# Patient Record
Sex: Female | Born: 1960 | Race: White | Smoking: Current some day smoker
Health system: Northeastern US, Academic
[De-identification: ages and names within clinical notes are randomized; demographics above are authoritative.]

## PROBLEM LIST (undated history)

## (undated) DIAGNOSIS — R011 Cardiac murmur, unspecified: Secondary | ICD-10-CM

## (undated) DIAGNOSIS — J811 Chronic pulmonary edema: Secondary | ICD-10-CM

## (undated) DIAGNOSIS — K219 Gastro-esophageal reflux disease without esophagitis: Secondary | ICD-10-CM

## (undated) DIAGNOSIS — R0602 Shortness of breath: Secondary | ICD-10-CM

## (undated) DIAGNOSIS — N979 Female infertility, unspecified: Secondary | ICD-10-CM

## (undated) DIAGNOSIS — C50919 Malignant neoplasm of unspecified site of unspecified female breast: Secondary | ICD-10-CM

## (undated) DIAGNOSIS — I509 Heart failure, unspecified: Secondary | ICD-10-CM

## (undated) DIAGNOSIS — I4891 Unspecified atrial fibrillation: Secondary | ICD-10-CM

## (undated) DIAGNOSIS — Z720 Tobacco use: Secondary | ICD-10-CM

## (undated) DIAGNOSIS — N930 Postcoital and contact bleeding: Secondary | ICD-10-CM

## (undated) HISTORY — PX: OTHER SURGICAL HISTORY: SHX169

## (undated) HISTORY — DX: Heart failure, unspecified: I50.9

## (undated) HISTORY — DX: Malignant neoplasm of unspecified site of unspecified female breast: C50.919

## (undated) HISTORY — DX: Unspecified atrial fibrillation: I48.91

## (undated) HISTORY — PX: ROTATOR CUFF REPAIR: SHX139

## (undated) HISTORY — DX: Cardiac murmur, unspecified: R01.1

## (undated) HISTORY — DX: Tobacco use: Z72.0

## (undated) HISTORY — DX: Chronic pulmonary edema: J81.1

## (undated) HISTORY — DX: Postcoital and contact bleeding: N93.0

## (undated) HISTORY — DX: Female infertility, unspecified: N97.9

## (undated) MED FILL — Paclitaxel IV Conc 300 MG/50ML (6 MG/ML): INTRAVENOUS | Qty: 31.17 | Status: AC

## (undated) MED FILL — ERX 40807065 - SEQUENCING OF DRUG OF ADMINISTRATION: Qty: 1 | Status: AC

## (undated) MED FILL — Cyclophosphamide IV Soln 1 GM/5ML (200 MG/ML): INTRAVENOUS | Qty: 7.02 | Status: AC

## (undated) MED FILL — Carboplatin IV Soln 450 MG/45ML: INTRAVENOUS | Qty: 20.67 | Status: AC

## (undated) MED FILL — Doxorubicin HCl Inj 2 MG/ML: INTRAVENOUS | Qty: 70 | Status: AC

## (undated) NOTE — Result Encounter Note (Signed)
Formatting of this note might be different from the original.  Please notify the patient the culture was positive for MRSA. Please continue the antibiotic that was provided.    Thank you  Electronically signed by Yancey Flemings, DO at 05/09/2021  3:27 PM EDT

## (undated) NOTE — Progress Notes (Signed)
Formatting of this note is different from the original.  Images from the original note were not included.    Carly Higgins                                                            05/04/2021  06-16-1960  [id number]    REASON FOR VISIT:   Patient presents with:  LESION, SKIN: Sore on stomach for a couple weeks.    HISTORY OF PRESENT ILLNESS:   Carly Higgins is a 22 year old female who presents today with right lower abdomen with open infected wound started as a small pimple she states over 2 weeks ago, has grown and is exposing fat layer now , no fever took an antibiotic by her oncologist which helped and her oncologist told her come here, here for a few more weeks before going home , doing radiation therapy for breast cancer     PAST MEDICAL HISTORY:  PAST MEDICAL HISTORY   Diagnosis Date    Cancer (HCC)     Hypertension     Thyroid disease      PAST SURGICAL HISTORY:  PAST SURGICAL HISTORY   Procedure Laterality Date    BONE MARROW HARV TRANSPLANT       SOCIAL HISTORY:  Social History    Social History Narrative      Not on file    FAMILY HISTORY:  History reviewed. No pertinent family history.    ALLERGIES:  Patient has no known allergies.    CURRENT MEDICATIONS:  No current outpatient medications on file prior to visit.     No current facility-administered medications on file prior to visit.     Some of the medications discontinued during this visit may have been discontinued upon the verbal directive of the patient that they are no longer taking.     05/04/21  1054   BP: 110/70   Pulse: 72   Resp: 18   Temp: 36.2 C (97.2 F)   SpO2: 98%   Weight: 109.8 kg (242 lb)   Height: 167.6 cm (5\' 6" )     Body mass index is 39.06 kg/m.    REVIEW OF SYSTEMS:    Review of Systems   Constitutional:  Negative for chills and fever.     PHYSICAL EXAM:    Physical Exam  Constitutional:       General: She is not in acute distress.     Appearance: She is not ill-appearing.   Skin:         Comments: 3.5 cm x 2.2. cm open wound with fat  layer exposure right abdominal area with clear discharge, no red streaks   Neurological:      Mental Status: She is alert.     ASSESSMENT/PLAN:  Diagnoses and all orders for this visit:  Nonhealing nonsurgical wound with fat layer exposed  -     CULTURE, AEROBIC BACTERIA; Future  -     CONSULT TO WOUND CARE_FL; Future  -     doxycycline hyclate (VIBRAMYCIN) 100 mg capsule; Take 1 capsule by mouth twice daily for 10 days.    Wound care referral to [facility] wound care, info sheet provided, she states will stop by their location to make an appointment, she has no pcp here, once culture returns  will call her and make any adjustment to her antibiotic we prescribed today     Patient Instructions     CELLULITIS:  Your exam shows you have an infection of the skin called cellulitis. This infection usually develops after an injury, cut, bite, or sting, but may occur without any known cause. Usually there is a localized area of redness, swelling, and pain which gets constantly bigger unless treatment is started. If cellulitis is severe or does not respond to initial treatment, hospital care with antibiotic injections may be needed.    Treatment of cellulitis includes antibiotics along with resting and elevating the affected area until the infection improves.  Please see your doctor if the pain and swelling from your infection are not better after two days of treatment.     Go to the emergency department right away if you develop fever, chills, or other serious problems.     Delsa Sale, DO  05/04/2021  10:51 AM  Electronically signed by Barnabas Lister Il, DO at 05/04/2021 11:30 AM EDT

---

## 2009-10-17 ENCOUNTER — Ambulatory Visit: Payer: Self-pay | Admitting: Registered"

## 2010-06-16 ENCOUNTER — Ambulatory Visit
Admit: 2010-06-16 | Discharge: 2010-06-16 | Disposition: A | Discharge status: Home / Self Care | Payer: Self-pay | Source: Ambulatory Visit | Attending: Internal Medicine | Admitting: Internal Medicine

## 2010-06-16 LAB — CBC
Hematocrit: 41 % (ref 34–45)
Hemoglobin: 13.5 g/dL (ref 11.2–15.7)
MCV: 83 fL (ref 79–95)
Platelets: 254 10*3/uL (ref 160–370)
RBC: 4.9 MIL/uL (ref 3.9–5.2)
RDW: 12.9 % (ref 11.7–14.4)
WBC: 7.3 10*3/uL (ref 4.0–10.0)

## 2010-06-16 LAB — URINALYSIS WITH MICROSCOPIC
Blood,UA: NEGATIVE
Ketones, UA: NEGATIVE
Leuk Esterase,UA: NEGATIVE
Nitrite,UA: NEGATIVE
Protein,UA: NEGATIVE mg/dL
RBC,UA: <1 /hpf (ref 0–2)
Specific Gravity,UA: 1.009 (ref 1.002–1.030)
WBC,UA: <1 /hpf (ref 0–5)
pH,UA: 6 (ref 5.0–8.0)

## 2010-06-16 LAB — COMPREHENSIVE METABOLIC PANEL
ALT: 32 U/L (ref 0–35)
AST: 28 U/L (ref 0–35)
Albumin: 4.8 g/dL (ref 3.5–5.2)
Alk Phos: 98 U/L (ref 35–105)
Anion Gap: 9 (ref 7–16)
Bilirubin,Total: 0.3 mg/dL (ref 0.0–1.2)
CO2: 28 mmol/L (ref 20–28)
Calcium: 9.4 mg/dL (ref 8.6–10.2)
Chloride: 101 mmol/L (ref 96–108)
Creatinine: 0.74 mg/dL (ref 0.51–0.95)
GFR,Black: >59 *
GFR,Caucasian: >59 *
Glucose: 87 mg/dL (ref 60–99)
Lab: 18 mg/dL (ref 6–20)
Potassium: 4.5 mmol/L (ref 3.3–5.1)
Sodium: 138 mmol/L (ref 133–145)
Total Protein: 7.4 g/dL (ref 6.3–7.7)

## 2010-06-16 LAB — LIPID PANEL
Chol/HDL Ratio: 2.6
Cholesterol: 176 mg/dL
HDL: 68 mg/dL
LDL Calculated: 101 mg/dL
Non HDL Cholesterol: 108 mg/dL
Triglycerides: 36 mg/dL

## 2010-06-18 LAB — VITAMIN D
25-OH VIT D2: <4 ng/mL
25-OH VIT D3: 20 ng/mL
25-OH Vit Total: 20 ng/mL — ABNORMAL LOW (ref 30–80)

## 2013-08-27 ENCOUNTER — Ambulatory Visit
Admit: 2013-08-27 | Discharge: 2013-08-27 | Disposition: A | Discharge status: Home / Self Care | Payer: Self-pay | Source: Ambulatory Visit | Attending: Internal Medicine | Admitting: Internal Medicine

## 2013-08-27 LAB — TSH: TSH: 4.72 u[IU]/mL — ABNORMAL HIGH (ref 0.27–4.20)

## 2013-08-27 LAB — CBC
Hematocrit: 39 % (ref 34–45)
Hemoglobin: 12.2 g/dL (ref 11.2–15.7)
MCH: 28 pg/cell (ref 26–32)
MCHC: 31 g/dL — ABNORMAL LOW (ref 32–36)
MCV: 90 fL (ref 79–95)
Platelets: 180 10*3/uL (ref 160–370)
RBC: 4.3 MIL/uL (ref 3.9–5.2)
RDW: 16.3 % — ABNORMAL HIGH (ref 11.7–14.4)
WBC: 43.5 10*3/uL — ABNORMAL HIGH (ref 4.0–10.0)

## 2013-08-27 LAB — LIPID PANEL
Chol/HDL Ratio: 3.8
Cholesterol: 184 mg/dL
HDL: 48 mg/dL
LDL Calculated: 113 mg/dL
Non HDL Cholesterol: 136 mg/dL
Triglycerides: 113 mg/dL

## 2013-08-27 LAB — COMPREHENSIVE METABOLIC PANEL
ALT: 23 U/L (ref 0–35)
AST: 27 U/L (ref 0–35)
Albumin: 4.6 g/dL (ref 3.5–5.2)
Alk Phos: 76 U/L (ref 35–105)
Anion Gap: 15 (ref 7–16)
Bilirubin,Total: 0.3 mg/dL (ref 0.0–1.2)
CO2: 23 mmol/L (ref 20–28)
Calcium: 9 mg/dL (ref 8.6–10.2)
Chloride: 104 mmol/L (ref 96–108)
Creatinine: 1.04 mg/dL — ABNORMAL HIGH (ref 0.51–0.95)
GFR,Black: 71 *
GFR,Caucasian: 61 *
Glucose: 99 mg/dL (ref 60–99)
Lab: 24 mg/dL — ABNORMAL HIGH (ref 6–20)
Potassium: 5.2 mmol/L — ABNORMAL HIGH (ref 3.3–5.1)
Sodium: 142 mmol/L (ref 133–145)
Total Protein: 7.1 g/dL (ref 6.3–7.7)

## 2013-08-27 LAB — T4, FREE: Free T4: 0.9 ng/dL (ref 0.9–1.7)

## 2013-08-28 LAB — HEPATITIS C ANTIBODY: Hep C Ab: NEGATIVE

## 2013-08-31 LAB — VITAMIN D
25-OH VIT D2: <4 ng/mL
25-OH VIT D3: 20 ng/mL
25-OH Vit Total: 20 ng/mL — ABNORMAL LOW (ref 30–60)

## 2013-09-01 ENCOUNTER — Ambulatory Visit
Admit: 2013-09-01 | Discharge: 2013-09-01 | Disposition: A | Discharge status: Home / Self Care | Payer: Self-pay | Source: Ambulatory Visit | Attending: Internal Medicine | Admitting: Internal Medicine

## 2013-09-01 LAB — CBC
Hematocrit: 41 % (ref 34–45)
Hemoglobin: 12.8 g/dL (ref 11.2–15.7)
MCH: 28 pg/cell (ref 26–32)
MCHC: 32 g/dL (ref 32–36)
MCV: 89 fL (ref 79–95)
Platelets: 200 10*3/uL (ref 160–370)
RBC: 4.5 MIL/uL (ref 3.9–5.2)
RDW: 16.1 % — ABNORMAL HIGH (ref 11.7–14.4)
WBC: 50.3 10*3/uL — ABNORMAL HIGH (ref 4.0–10.0)

## 2013-09-03 LAB — JAK2 GENE: JAK2 V617F: NEGATIVE

## 2013-09-03 LAB — JAK2 REVIEW

## 2013-09-07 LAB — FISH, CYTOGENETICS: FISH Cells: 200

## 2013-09-07 LAB — FISH REVIEW

## 2013-09-08 LAB — SURGICAL PATHOLOGY

## 2013-09-10 ENCOUNTER — Ambulatory Visit: Payer: Self-pay | Admitting: Hematology

## 2013-09-10 ENCOUNTER — Encounter: Payer: Self-pay | Admitting: Hematology

## 2013-09-10 VITALS — BP 140/75 | HR 67 | Temp 96.8°F | Resp 16 | Ht 65.35 in | Wt 261.0 lb

## 2013-09-10 DIAGNOSIS — D72829 Elevated white blood cell count, unspecified: Secondary | ICD-10-CM

## 2013-09-10 LAB — BASIC METABOLIC PANEL
Anion Gap: 13 (ref 7–16)
CO2: 26 mmol/L (ref 20–28)
Calcium: 9.2 mg/dL (ref 8.6–10.2)
Chloride: 101 mmol/L (ref 96–108)
Creatinine: 0.88 mg/dL (ref 0.51–0.95)
GFR,Black: 87 *
GFR,Caucasian: 75 *
Glucose: 95 mg/dL (ref 60–99)
Lab: 17 mg/dL (ref 6–20)
Potassium: 4.3 mmol/L (ref 3.3–5.1)
Sodium: 140 mmol/L (ref 133–145)

## 2013-09-10 LAB — CBC AND DIFFERENTIAL
Baso # K/uL: 0 10*3/uL (ref 0.0–0.1)
Basophil %: 0 %
Eos # K/uL: 0 10*3/uL (ref 0.0–0.4)
Eosinophil %: 0 %
Hematocrit: 39 % (ref 34–45)
Hemoglobin: 12.3 g/dL (ref 11.2–15.7)
Lymph # K/uL: 2 10*3/uL (ref 1.2–3.7)
Lymphocyte %: 3.9 %
MCH: 29 pg/cell (ref 26–32)
MCHC: 32 g/dL (ref 32–36)
MCV: 90 fL (ref 79–95)
Mono # K/uL: 0.8 10*3/uL (ref 0.2–0.9)
Monocyte %: 1.6 %
Neut # K/uL: 34.3 10*3/uL — ABNORMAL HIGH (ref 1.6–6.1)
Nucl RBC # K/uL: 0.3 10*3/uL — ABNORMAL HIGH (ref 0.0–0.0)
Nucl RBC %: 0.6 /100 WBC — ABNORMAL HIGH (ref 0.0–0.2)
Platelets: 176 10*3/uL (ref 160–370)
RBC: 4.3 MIL/uL (ref 3.9–5.2)
RDW: 16.1 % — ABNORMAL HIGH (ref 11.7–14.4)
Seg Neut %: 59.8 %
WBC: 49.7 10*3/uL — ABNORMAL HIGH (ref 4.0–10.0)

## 2013-09-10 LAB — DIFF MANUAL
Bands %: 9 % (ref 0–10)
Blasts %: 2 % — ABNORMAL HIGH (ref 0–0)
Diff Based On: 127 CELLS
Metamyelocyte %: 12 % — ABNORMAL HIGH (ref 0–1)
Misc. Cell %: 0 % (ref 0–0)
Myelocyte %: 12 % — ABNORMAL HIGH (ref 0–0)

## 2013-09-10 LAB — APTT: aPTT: 28.3 s (ref 25.8–37.9)

## 2013-09-10 LAB — PROTIME-INR
INR: 0.9 — ABNORMAL LOW (ref 1.0–1.2)
Protime: 10.5 s (ref 9.2–12.3)

## 2013-09-10 LAB — DUPLICATE SLIDE: Slide Sent To: 114

## 2013-09-10 LAB — URIC ACID: Urate: 4.9 mg/dL (ref 2.7–6.8)

## 2013-09-10 NOTE — Patient Instructions (Signed)
Ultrasound (NO FOOD OR DRINK FOR 8 HOURS PRIOR) Take the green elevators in the main lobby to the ground floor, radiology will be on your left) 09/24/13 at 8:30am    Dr. Aris Lot 09/29/13 at 2:15pm

## 2013-09-11 LAB — HEMATOPATHOLOGY REVIEW

## 2013-09-14 NOTE — Progress Notes (Addendum)
Name: Carly Higgins  MRN: [id number]  PCP: Benedict Needy, MD (General)  Referring physician: Benedict Needy, MD  Date: 09/10/2013    Reason for referral: Leukocytosis.     History of Present Illness:      Carly Higgins is a 53 y.o. female who is referred to [place] for leukocytosis. She was seen by Dr. Loann Quill and had routine blood work after 3 years which showed wbc count of 43.5 on 08/27/13. Repeat CBC on 09/01/13 showed wbc count of 50.3. There was no differential performed on CBC. Her last CBC 3 years ago showed wbc count 7.3. Peripheral blood flow cytometry was ordered and peripheral smear showed normal lymphocytes (7%) Neutrophils were Increased (37%), with significant left-shifted granulocytosis including bands (19%), metamyelocytes (16%), myelocytes (13%), rare promyelocytes (2%), and blasts (1%). Peripheral flow cytometry was not performed due to normal lymphocytes count and appearance. JAK-2 gene mutation was not detected and FISH for ABL1/BCR was negative.     She reports having a small round bump on left side of forehead for 3 months. It is not increasing in size. She reports having some left ear pain when she had lab work. She reports stress at her work place and stress from a death in her family. She was started on Lexapro and reports improvement in her anxiety and depression symptoms. She denies fevers, chills, recurrent infections, weight loss, cough, chest pain, shortness of breath, nausea, vomiting, diarrhea, constipation, headaches, vision changes, urinary problems, numbness, weakness, bleeding, or bruising. She reports some hot and sweaty feeling from menopause.      Past Medical History:   Anxiety/ Depression.  Obesity    Past Surgical History:  No major surgeries in past.     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       Family History:   History reviewed. No pertinent family history.    Review of Systems: Refer to HPI for pertinent positives and negatives. Remainder of detailed 12 point ROS (constitutional, eyes, ears/nose/mouth/throat, cardiovascular, respiratory, gastrointestinal, genitourinary, musculoskeletal, integumentary, neurologic, psychiatric, endocrine, hematologic, allergic/immunologic) is negative.     Physical Exam  Vitals:   Filed Vitals:    09/10/13 1013   BP: 140/75   Pulse: 67   Temp: 36 C (96.8 F)   Resp: 16   Height: 166 cm (5' 5.35")   Weight: 118.4 kg (261 lb 0.4 oz)     Pain score: 0  ECOG Performance Status: 1  General appearance: well nourished, well developed, appears stated age. Obese.   Head, ears, nose, mouth throat: no head deformities, normal external appearance of ears. normal gross  hearing. No mucositis/thrush, petechiae, gingival bleeding.   Eyes: no scleral icterus. EOMI, PERL.   Lymph nodes: no enlarged cervical, axillary, inguinal or femoral lymph nodes.  Chest: clear to ascultation; no wheezes, crackles, rhonchi.  Cardiac: regular rate and rhythm, no murmurs/rubs/gallops.  Abdominal: active bowel sounds, soft, non-tender to palpation in all four quadrants, no hepatomegaly or splenomegaly.  Musculoskeletal: no CVA tenderness, no tenderness over spine or other bones, no joint swelling, muscle atrophy.  Skin: no bruises or rashes.  Extremities: warm to touch, palpable pulses, no lower extremity  edema.  Neurological: alert and oriented to person, place and time; normal comprehension and speech. grossly normal cranial nerves, strength and sensation; moves all four extremities spontaneously.    Laboratory Results:    Peripheral blood smear reviewed under microscope: shows neutrophilic leukocytosis with increased myelocytes, metamyelocytes, bands and rare blast cells.    09/10/2013 11:39   Sodium 140   Potassium 4.3   Chloride 101   CO2 26   Anion Gap 13   UN 17   Creatinine 0.88   GFR,Black 87   GFR,Caucasian 75   Glucose 95   Calcium 9.2   Urate 4.9   Protime 10.5   INR 0.9 (L)   aPTT 28.3   WBC 49.7 (H)   RBC 4.3   Hemoglobin 12.3   Hematocrit 39   MCV 90   MCH 29   MCHC 32   RDW 16.1 (H)   Platelets 176   Giant PLTs Present   Manual DIFF RESULTS   Neut # K/uL 34.3 (H)   Lymph # K/uL 2.0   Mono # K/uL 0.8   Eos # K/uL 0.0   Baso # K/uL 0.0   Nucl RBC # K/uL 0.3 (H)   Seg Neut % 59.8   Lymphocyte % 3.9   Monocyte % 1.6   Eosinophil % 0.0   Basophil % 0.0   Nucl RBC % 0.6 (H)   Bands % 9   Metamyelocyte % 12 (H)   Myelocyte % 12 (H)   Blasts % 2 (H)   Misc. Cell % 0   Smudge Cells Present   Diff Based On 127   Slide Sent 12:23   Slide Sent To 114   Pathologist Review See Text     Blood culture: NGTD.    FINAL DIAGNOSIS:  Peripheral blood:  - Leukocytosis with left shifted neutrophilia (see comment).  - No malignancy identified    COMMENT: Clinical correlation is required to determine the etiology of  the patient's left-shifted leukocytosis. Molecular/cytogenetics studies  for BCR-ABL and JAK2 are negative, excluding the most common neoplastic  causes of these findings. Lymphocytes are not increased and abnormal  forms are not identified.    MICROSCOPIC DESCRIPTION:  Complete Blood Count, dated 09/01/2013  Test Name Result  WBC 50.3  RBC 4.5  HEMOGLOBIN 12.8  HEMATOCRIT 41  MCV 89  MCH 28  MCHC 32  RDW 16.1  PLATELETS 200    Peripheral Blood Smear:  Quality: Sub-optimal (apoptotic cells present)  Red blood cells:  normocytic, normochromic, nucleated red blood cells  present (3%)  Lymphocytes: unremarkable (not increased, 7%)  Neutrophils: Increased (37%), with significant left-shifted  granulocytosis including bands (19%), metamyelocytes (16%), myelocytes  (13%), rare promyelocytes (2%), and blasts (1%).  Platelets: unremarkable.    JAK-2 Gene  The results were normal. We found no evidence of the JAK2 V617F  mutation in this specimen within the sensitivity limits of the analysis  (  approximately 0.1%). This result does not rule out a  myeloproliferative neoplasm.      FISH analysis with the probe specific for the ABL1/BCR fusion was performed on 200 interphase nuclei. There was no Ph' rearrangement detected.      Assessment:   Carly Higgins is a 53 y.o. female with Neutrophilic Leukocytosis of unknown duration. JAK gene mutation and FISH for ABL1/BCR is negative which makes CML highly unlikely. We obtained the labs on her way out which show persistent neutrophilic leukocytosis with increased myeloid precursors (myelocytes and metamyelocytes) on peripheral smear and rare blasts. She does not have any fever or symptoms of infection to suggest leukemoid reaction. Blood culture show no growth. Smoking and obesity can cause leukocytosis but leukocyte count is too high to be explained by these conditions. Chronic neutrophilic leukemia usually has less than 5% of leukocytes on peripheral blood smear. Other possible differentials include AML or undifferentiated myeloproliferative disorder.      We have scheduled a US abdomen to assess for any splenomegaly.     We will schedule a bone marrow biopsy next week to find out the etiology of this.     Pt was seen and discussed in detail with attending oncologist, Dr. Aris Lot.     Claudette Stapler, MBBS  Med-Oncology/ Hematology Fellow      Oncology Attending:  I have seen and examined the patient.  The case was discussed with the team and I agree with the above management and plan.  Given her  persistent leukocytosis, we will plan a BM biopsy.    Earleen Newport, MD

## 2013-09-16 LAB — BLOOD CULTURE: Bacterial Blood Culture: 0

## 2013-09-21 ENCOUNTER — Telehealth: Payer: Self-pay | Admitting: Hematology

## 2013-09-21 NOTE — Telephone Encounter (Signed)
LM for patient that she should be okay to go back to work tomorrow after her BMBx.  Explained that she may have some discomfort at this site, but this is typically relieved by tylenol.  Asked that she call back with any further questions or concerns.

## 2013-09-22 ENCOUNTER — Ambulatory Visit: Payer: Self-pay

## 2013-09-22 ENCOUNTER — Ambulatory Visit: Payer: Self-pay | Admitting: Hematology

## 2013-09-22 ENCOUNTER — Encounter: Payer: Self-pay | Admitting: Hematology

## 2013-09-22 VITALS — BP 159/86 | HR 75 | Temp 97.5°F | Resp 17 | Ht 65.35 in | Wt 262.5 lb

## 2013-09-22 DIAGNOSIS — D72829 Elevated white blood cell count, unspecified: Secondary | ICD-10-CM | POA: Insufficient documentation

## 2013-09-22 HISTORY — DX: Elevated white blood cell count, unspecified: D72.829

## 2013-09-22 NOTE — Procedures (Signed)
BM Biopsy and Aspiration Procedure  PREOPERATIVE DIAGNOSIS:   Leukocytosis  POSTOPERATIVE DIAGNOSIS:  Leukocytosis      OPERATIVE PROCEDURE:      Bone marrow aspiration and bone marrow biopsy.     ANESTHESIA: 1% lidocaine approximately 10 mL.       DATE AND  TIME OF PROCEDURE: 09/22/2013 at 9:57 AM     INDICATIONS: An 53 year-old female with leukocytosis  Written consent was obtained after explaining in detail about adverse effects of the procedure, which included sciatic fractures, nerve injury, hematoma, infection, and pain.  Patient comprehended the information very well and  has consented to the procedure.      PREPROCEDURE CONDITION:   Patient vital signs stable.     PROCEDURE PARTICIPANTS:   Bone marrow technician Janus Molder and myself    DESCRIPTION OF PROCEDURE:  After the patient was identified with two different identifiers, informed consent was obtained from the patient. The procedure, risks and benefits and the complication such as bleeding, infection, fractures and pain were explained to the patient who voiced understanding of the information. Their questions were sought and answered in appropriate manner. Then patient was placed in prone position and left posterior iliac crest was identified and marked with a skin marker.  The site was sterilized with the sterile chlorhexidine/betadine solution and sterile drape was placed over the site. Subsequently, 1% lidocaine was inserted, approximately 10 mL, to anesthetize the local area and the periostium.  Small incision was made with the sterile scalpel and Jamshidi needle was advanced with steady pressure and a slight twisting motion to the center of the posterior iliac prominence. Approximately 10 mL of bone marrow aspiration was obtained for the diagnostic purpose after confirming the presence of spicules in the specimen.  Subsequently, bone marrow biopsy was performed using the same incision site.  About 1 cm of the bone marrow biopsy specimen was  obtained and sent for evaluation.  Patient has tolerated the procedure without any problem.  He denied any significant pain during or post procedure, and his post-procedure condition remained stable.       POSTPROCEDURE CONDITION:  Patient in stable condition.       FOLLOWUP:  The bone marrow aspiration and biopsy were sent for routine Histopathology, routine cytogenetic, flow cytometry, and bcr-abl panel. Patient has a   follow-up appointment with Dr. Aris Lot next week.     I reviewed the post bone marrow biopsy instruction with her.    Claudette Stapler, MBBS  Hematology/Oncology Fellow

## 2013-09-24 ENCOUNTER — Ambulatory Visit
Admit: 2013-09-24 | Discharge: 2013-09-24 | Disposition: A | Discharge status: Home / Self Care | Payer: Self-pay | Source: Ambulatory Visit | Attending: Hematology | Admitting: Hematology

## 2013-09-25 LAB — CHROMOSOME ANALYSIS
Cells Analyzed: 20
Cells Counted: 20
Karyo Made: 2

## 2013-09-25 LAB — CHROMOSOMES REVIEW

## 2013-09-28 ENCOUNTER — Telehealth: Payer: Self-pay | Admitting: Hematology

## 2013-09-28 NOTE — Telephone Encounter (Signed)
I informed her about bone marrow biopsy final result and explained to her that we will schedule her to see on of our attending who sees myeloid disorder. I have scheduled appointment for her to see [name] and message was left on her extension for appointment (per her request).    She verbalized understanding.    Claudette Stapler, MBBS  Med-Oncology/ Hematology Fellow

## 2013-09-29 ENCOUNTER — Ambulatory Visit: Payer: Self-pay | Admitting: Hematology

## 2013-10-02 ENCOUNTER — Ambulatory Visit: Payer: Self-pay | Admitting: Hematology

## 2013-10-02 ENCOUNTER — Encounter: Payer: Self-pay | Admitting: Hematology

## 2013-10-02 VITALS — BP 147/67 | HR 74 | Temp 97.2°F | Resp 18 | Ht 65.35 in | Wt 263.2 lb

## 2013-10-02 DIAGNOSIS — D471 Chronic myeloproliferative disease: Secondary | ICD-10-CM

## 2013-10-02 MED ORDER — HYDROXYUREA 500 MG PO CAPS *I*
500.0000 mg | ORAL_CAPSULE | Freq: Every day | ORAL | Status: DC
Start: 2013-10-02 — End: 2014-02-02

## 2013-10-02 MED ORDER — PROCHLORPERAZINE MALEATE 10 MG PO TABS *I*
10.0000 mg | ORAL_TABLET | Freq: Four times a day (QID) | ORAL | Status: DC | PRN
Start: 2013-10-02 — End: 2014-11-08

## 2013-10-02 NOTE — H&P (Addendum)
Hematology/Oncology Initial Patient Visit    Subjective:      Patient ID: Carly Higgins is a 53 y.o. 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     HPI   Carly Higgins went to see her PCP in August and routine blood work was checked showing a WBC of 43.5, it was 50 several days later. A smear showed increased neutrophils at 35%, 13% myelocytes, 19% bands, 16% metamyelocytes, 13% myelocytes, 1% blasts and rare promyelocytes. BCR-Abl was negative as was Jak2.  A bone marrow was done on 9/15 which showed leukocytosis most consistent with a myeloproliferative neoplasm, unclassifiable. No significant dysplasia. Myeloid:erythroid ratio of 9. Flow showed a normal myeloid maturation pattern. Normal karyotype.  She presents today for treatment recommendations for her myeloproliferative disorder.     She has mostly been feeling well just with some fatigue. She has gained weight but thinks this is due to stress. We discussed her cigarette smoking and she knows she should quit but does not want to at this time.       PMH: No medical problems    Patient Active Problem List    Diagnosis Date Noted    Leukocytosis 09/22/2013   No past surgical history on file.  Current Outpatient Prescriptions   Medication    escitalopram (LEXAPRO) 10 MG tablet    cholecalciferol (VITAMIN D) 2000 UNITS tablet     No current facility-administered medications for this visit.     Current Outpatient Prescriptions on File Prior to Visit   Medication Sig Dispense Refill    escitalopram (LEXAPRO) 10 MG tablet Take 15 mg by mouth daily      cholecalciferol (VITAMIN D) 2000 UNITS tablet Take 2,000 Units by mouth daily       No current facility-administered medications on file prior to visit.   No Known Allergies (drug, envir, food or latex)} , No family history on file. and   History     Social History    Marital Status: Divorced     Spouse Name: N/A     Number of Children: N/A    Years of  Education: N/A     Social History Main Topics    Smoking status: Current Every Day Smoker -- 0.50 packs/day for 30 years    Smokeless tobacco: None    Alcohol Use: Yes      Comment: occassional    Drug Use: No    Sexual Activity: None     Other Topics Concern    None     Social History Narrative       Review of Systems   Constitutional: Positive for fatigue. Negative for fever, chills and diaphoresis.   HENT: Negative for mouth sores and sore throat.    Eyes: Negative for visual disturbance.   Respiratory: Negative for cough, choking and shortness of breath.         +SOB with activity but not at rest   Cardiovascular: Negative for chest pain, palpitations and leg swelling.   Gastrointestinal: Negative for nausea, vomiting, abdominal pain, diarrhea, constipation and abdominal distention.   Musculoskeletal: Positive for arthralgias. Negative for myalgias, back pain, joint swelling, gait problem and neck pain.        B/L hip pain    Skin: Negative for pallor, rash and wound.        + resolving lump on forehead   Neurological: Negative for dizziness, speech difficulty, light-headedness, numbness and headaches.   Hematological: Negative for adenopathy. Does  not bruise/bleed easily.   Psychiatric/Behavioral: Negative for confusion and agitation.     Objective:     Filed Vitals:    10/02/13 1313   BP: 147/67   Pulse: 74   Temp: 36.2 C (97.2 F)   Resp: 18   Height: 166 cm (5' 5.35")   Weight: 119.4 kg (263 lb 3.7 oz)       No results found for this or any previous visit (from the past 72 hour(s)).    Physical Exam   Constitutional: She is oriented to person, place, and time. She appears well-developed and well-nourished.   Overweight   HENT:   Head: Normocephalic and atraumatic.   Mouth/Throat: No oropharyngeal exudate.   Eyes: EOM are normal. Pupils are equal, round, and reactive to light.   Neck: Normal range of motion. Neck supple.   Cardiovascular: Normal rate, regular rhythm and intact distal pulses.     Pulmonary/Chest: Effort normal and breath sounds normal. No respiratory distress. She has no wheezes.   Abdominal: Soft. Bowel sounds are normal. She exhibits no distension and no mass. There is no tenderness. There is no guarding.   No hepatosplenomegaly    Musculoskeletal: She exhibits no edema.   Lymphadenopathy:     She has no cervical adenopathy.   Neurological: She is alert and oriented to person, place, and time.   Skin: Skin is warm and dry. No rash noted. No erythema. No pallor.   Psychiatric: She has a normal mood and affect. Her behavior is normal.         Lab results: 09/10/13  1139   WBC 49.7*   HEMOGLOBIN 12.3   HEMATOCRIT 39   RBC 4.3   PLATELETS 176   NEUT # K/UL 34.3*   LYMPH # K/UL 2.0   MONO # K/UL 0.8   EOS # K/UL 0.0   BASO # K/UL 0.0   SEG NEUT % 59.8   LYMPHOCYTE % 3.9   MONOCYTE % 1.6   EOSINOPHIL % 0.0   BASOPHIL % 0.0   BANDS % 9   MYELOCYTE % 12*   METAMYELOCYTE % 12*   BLASTS % 2*           Lab results: 09/10/13  1139 08/27/13  0829   SODIUM 140 142   POTASSIUM 4.3 5.2*   CHLORIDE 101 104   CO2 26 23   UN 17 24*   CREATININE 0.88 1.04*   GFR,CAUCASIAN 75 61   GFR,BLACK 87 71   GLUCOSE 95 99   CALCIUM 9.2 9.0   TOTAL PROTEIN  --  7.1   ALBUMIN  --  4.6   ALT  --  23   AST  --  27   ALK PHOS  --  76   BILIRUBIN,TOTAL  --  0.3     US Abdomen Limited Single Quad Or F/u Specify    09/25/2013   IMPRESSION:   Borderline splenomegaly.   END OF IMPRESSION.     I have personally reviewed the image(s) and the resident's  interpretation and agree with or edited the findings.           Assessment:     Carly Higgins is a 53 year old female with newly diagnosed unclassified myeloproliferative disorder, recent abdominal US shows mild splenomegaly.     Plan:     Leukocytosis from myeloproliferative disorder  - Will have her start hydrea 500 mg daily, common side effects discussed, hand out provided and consent  signed.   - Compazine prescription provided in the event that she has some nausea  - Will monitor  CBC every 2 weeks initially  - RTC in 4-5 weeks      Smoking cessation  -Discussed and let her know about our smoking cessation clinic. She will think about it but is not ready to do so at this time.     Patient seen and discussed with attending, Dr. Massie Maroon MD, PHD  Medical Oncology Fellow

## 2013-10-07 NOTE — Addendum Note (Signed)
Addended by: Maylon Peppers on: 10/07/2013 07:57 PM     Modules accepted: Orders

## 2013-10-09 LAB — PATIENT CONSENT

## 2013-10-12 LAB — SURGICAL PATHOLOGY

## 2013-10-28 ENCOUNTER — Telehealth: Payer: Self-pay

## 2013-10-28 DIAGNOSIS — D72829 Elevated white blood cell count, unspecified: Secondary | ICD-10-CM

## 2013-10-28 NOTE — Telephone Encounter (Signed)
Called pt to assess Hydroxyurea tolerance.  She had mild nausea initially, but none since.  No other ill effects noted.  Reminded to have labs drawn this week.  Good understanding verbalized.

## 2013-10-30 ENCOUNTER — Telehealth: Payer: Self-pay

## 2013-10-30 ENCOUNTER — Ambulatory Visit
Admit: 2013-10-30 | Discharge: 2013-10-30 | Disposition: A | Discharge status: Home / Self Care | Payer: Self-pay | Source: Ambulatory Visit | Attending: Hematology | Admitting: Hematology

## 2013-10-30 DIAGNOSIS — D471 Chronic myeloproliferative disease: Secondary | ICD-10-CM

## 2013-10-30 DIAGNOSIS — D72829 Elevated white blood cell count, unspecified: Secondary | ICD-10-CM

## 2013-10-30 LAB — CHROMOSOME MICRO-ARRAY CGH: CGH Findings: NEGATIVE

## 2013-10-30 LAB — CBC AND DIFFERENTIAL
Baso # K/uL: 1.4 10*3/uL — ABNORMAL HIGH (ref 0.0–0.1)
Basophil %: 3.4 %
Eos # K/uL: 0.7 10*3/uL — ABNORMAL HIGH (ref 0.0–0.4)
Eosinophil %: 1.7 %
Hematocrit: 37 % (ref 34–45)
Hemoglobin: 12.2 g/dL (ref 11.2–15.7)
Lymph # K/uL: 2.5 10*3/uL (ref 1.2–3.7)
Lymphocyte %: 4.2 %
MCH: 30 pg/cell (ref 26–32)
MCHC: 33 g/dL (ref 32–36)
MCV: 91 fL (ref 79–95)
Mono # K/uL: 0 10*3/uL — ABNORMAL LOW (ref 0.2–0.9)
Monocyte %: 0 %
Neut # K/uL: 28.6 10*3/uL — ABNORMAL HIGH (ref 1.6–6.1)
Nucl RBC # K/uL: 0.2 10*3/uL — ABNORMAL HIGH (ref 0.0–0.0)
Nucl RBC %: 0.5 /100 WBC — ABNORMAL HIGH (ref 0.0–0.2)
Platelets: 151 10*3/uL — ABNORMAL LOW (ref 160–370)
RBC: 4.1 MIL/uL (ref 3.9–5.2)
RDW: 17.3 % — ABNORMAL HIGH (ref 11.7–14.4)
Seg Neut %: 45.3 %
WBC: 42.1 10*3/uL — ABNORMAL HIGH (ref 4.0–10.0)

## 2013-10-30 LAB — COMPREHENSIVE METABOLIC PANEL
ALT: 28 U/L (ref 0–35)
AST: 35 U/L (ref 0–35)
Albumin: 4.6 g/dL (ref 3.5–5.2)
Alk Phos: 73 U/L (ref 35–105)
Anion Gap: 13 (ref 7–16)
Bilirubin,Total: 0.3 mg/dL (ref 0.0–1.2)
CO2: 24 mmol/L (ref 20–28)
Calcium: 9.1 mg/dL (ref 8.6–10.2)
Chloride: 103 mmol/L (ref 96–108)
Creatinine: 0.86 mg/dL (ref 0.51–0.95)
GFR,Black: 89 *
GFR,Caucasian: 77 *
Glucose: 103 mg/dL — ABNORMAL HIGH (ref 60–99)
Lab: 19 mg/dL (ref 6–20)
Potassium: 4.8 mmol/L (ref 3.3–5.1)
Sodium: 140 mmol/L (ref 133–145)
Total Protein: 7.3 g/dL (ref 6.3–7.7)

## 2013-10-30 LAB — SINGLE-NUCLEOTIDE POLYMORPHISM
SNP Findings: POSITIVE
SNP Size: 14

## 2013-10-30 LAB — SNP REVIEW

## 2013-10-30 LAB — DIFF MANUAL
Bands %: 23 % (ref 0–10)
Diff Based On: 119 CELLS
Metamyelocyte %: 7 % — ABNORMAL HIGH (ref 0–1)
Myelocyte %: 14 % — ABNORMAL HIGH (ref 0–0)
React Lymph %: 2 % (ref 0–6)

## 2013-10-30 LAB — NEUTROPHIL #-INSTRUMENT: Neutrophil #-Instrument: 27.6 10*3/uL

## 2013-10-30 LAB — MICROARRAY REVIEW

## 2013-10-30 NOTE — Telephone Encounter (Signed)
Critical lab value called into triage line     Name of provider: Liesveld     Time provider paged: 11:03     Time provider responded: 11:06     Critical lab and value: Bands 23%     Action taken as result of notification: provider notified and aware

## 2013-11-03 LAB — BCRMQ REVIEW

## 2013-11-03 LAB — BCR/ABL MAJOR, QUANT: Mbcr/abl: NEGATIVE

## 2013-11-09 ENCOUNTER — Telehealth: Payer: Self-pay | Admitting: Hematology

## 2013-11-09 NOTE — Telephone Encounter (Signed)
Reveiwed labs with patient.  WBC is coming down - 42.1  She states she is tolerating Hydroxyurea well - nausea is well controlled with an occasional Compazine.   Denies other complaints.  Repeat labs very 2 weeks.   Good understanding verbalized.

## 2013-11-13 ENCOUNTER — Telehealth: Payer: Self-pay

## 2013-11-13 ENCOUNTER — Ambulatory Visit
Admit: 2013-11-13 | Discharge: 2013-11-13 | Disposition: A | Discharge status: Home / Self Care | Payer: Self-pay | Source: Ambulatory Visit | Attending: Hematology | Admitting: Hematology

## 2013-11-13 ENCOUNTER — Ambulatory Visit: Payer: Self-pay | Admitting: Hematology

## 2013-11-13 VITALS — BP 171/88 | HR 74 | Temp 97.2°F | Resp 18 | Ht 65.35 in | Wt 261.1 lb

## 2013-11-13 DIAGNOSIS — D471 Chronic myeloproliferative disease: Secondary | ICD-10-CM

## 2013-11-13 DIAGNOSIS — D72829 Elevated white blood cell count, unspecified: Secondary | ICD-10-CM

## 2013-11-13 LAB — CBC AND DIFFERENTIAL
Baso # K/uL: 0 10*3/uL (ref 0.0–0.1)
Basophil %: 0 %
Eos # K/uL: 0 10*3/uL (ref 0.0–0.4)
Eosinophil %: 0 %
Hematocrit: 38 % (ref 34–45)
Hemoglobin: 12.1 g/dL (ref 11.2–15.7)
Lymph # K/uL: 3.2 10*3/uL (ref 1.2–3.7)
Lymphocyte %: 7 %
MCH: 30 pg/cell (ref 26–32)
MCHC: 32 g/dL (ref 32–36)
MCV: 93 fL (ref 79–95)
Mono # K/uL: 1.8 10*3/uL — ABNORMAL HIGH (ref 0.2–0.9)
Monocyte %: 4 %
Neut # K/uL: 34 10*3/uL — ABNORMAL HIGH (ref 1.6–6.1)
Nucl RBC # K/uL: 0.2 10*3/uL — ABNORMAL HIGH (ref 0.0–0.0)
Nucl RBC %: 0.4 /100 WBC — ABNORMAL HIGH (ref 0.0–0.2)
Platelets: 163 10*3/uL (ref 160–370)
RBC: 4 MIL/uL (ref 3.9–5.2)
RDW: 17.9 % — ABNORMAL HIGH (ref 11.7–14.4)
Seg Neut %: 61 %
WBC: 45.3 10*3/uL — ABNORMAL HIGH (ref 4.0–10.0)

## 2013-11-13 LAB — COMPREHENSIVE METABOLIC PANEL
ALT: 22 U/L (ref 0–35)
AST: 34 U/L (ref 0–35)
Albumin: 4.6 g/dL (ref 3.5–5.2)
Alk Phos: 74 U/L (ref 35–105)
Anion Gap: 16 (ref 7–16)
Bilirubin,Total: 0.4 mg/dL (ref 0.0–1.2)
CO2: 22 mmol/L (ref 20–28)
Calcium: 9.3 mg/dL (ref 8.6–10.2)
Chloride: 102 mmol/L (ref 96–108)
Creatinine: 0.91 mg/dL (ref 0.51–0.95)
GFR,Black: 83 *
GFR,Caucasian: 72 *
Glucose: 122 mg/dL — ABNORMAL HIGH (ref 60–99)
Lab: 22 mg/dL — ABNORMAL HIGH (ref 6–20)
Potassium: 4.6 mmol/L (ref 3.3–5.1)
Sodium: 140 mmol/L (ref 133–145)
Total Protein: 7.5 g/dL (ref 6.3–7.7)

## 2013-11-13 LAB — DIFF MANUAL
Bands %: 14 % (ref 0–10)
Diff Based On: 100 CELLS
Metamyelocyte %: 8 % — ABNORMAL HIGH (ref 0–1)
Myelocyte %: 6 % — ABNORMAL HIGH (ref 0–0)

## 2013-11-13 LAB — NEUTROPHIL #-INSTRUMENT: Neutrophil #-Instrument: 29.3 10*3/uL

## 2013-11-13 NOTE — Telephone Encounter (Signed)
Critical lab value called into triage line     Name of provider: Liesveld     Time provider paged: NA     Time provider responded: NA     Critical lab and value: Bands 14%     Action taken as result of notification: Lanelle Bal RN notified and aware. Patient has an appointment with [name] this afternoon.

## 2013-11-16 ENCOUNTER — Telehealth: Payer: Self-pay

## 2013-11-16 ENCOUNTER — Encounter: Payer: Self-pay | Admitting: Hematology

## 2013-11-16 ENCOUNTER — Encounter: Payer: Self-pay | Admitting: Gastroenterology

## 2013-11-16 NOTE — Telephone Encounter (Signed)
Pt will come to our lab on 11/12 to have blood drawn and sent to [place] for mutation analysis.

## 2013-11-16 NOTE — Progress Notes (Signed)
Hematology/Oncology Progress Note     Subjective:      Patient ID: Carly Higgins is a 53 y.o. 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     HPI  Continues to do well. Is tolerating the hydrea well, did have some nausea with the first few doses. She continues to smoke and is not ready to quit at this time.     Patient Active Problem List    Diagnosis Date Noted    Leukocytosis 09/22/2013   No past surgical history on file.  Current Outpatient Prescriptions   Medication    hydroxyurea (HYDREA) 500 MG capsule    prochlorperazine (COMPAZINE) 10 MG tablet    escitalopram (LEXAPRO) 10 MG tablet    cholecalciferol (VITAMIN D) 2000 UNITS tablet     No current facility-administered medications for this visit.     Current Outpatient Prescriptions on File Prior to Visit   Medication Sig Dispense Refill    hydroxyurea (HYDREA) 500 MG capsule Take 1 capsule (500 mg total) by mouth daily 30 capsule 6    prochlorperazine (COMPAZINE) 10 MG tablet Take 1 tablet (10 mg total) by mouth 4 times daily as needed for Nausea 40 tablet 2    escitalopram (LEXAPRO) 10 MG tablet Take 15 mg by mouth daily      cholecalciferol (VITAMIN D) 2000 UNITS tablet Take 2,000 Units by mouth daily       No current facility-administered medications on file prior to visit.   No Known Allergies (drug, envir, food or latex)} , No family history on file.,   History     Social History    Marital Status: Divorced     Spouse Name: N/A     Number of Children: N/A    Years of Education: N/A     Social History Main Topics    Smoking status: Current Every Day Smoker -- 0.50 packs/day for 30 years    Smokeless tobacco: Not on file    Alcohol Use: Yes      Comment: occassional    Drug Use: No    Sexual Activity: Not on file     Other Topics Concern    Not on file     Social History Narrative    and   Current Outpatient Prescriptions   Medication    hydroxyurea (HYDREA) 500 MG capsule     prochlorperazine (COMPAZINE) 10 MG tablet    escitalopram (LEXAPRO) 10 MG tablet    cholecalciferol (VITAMIN D) 2000 UNITS tablet     No current facility-administered medications for this visit.       Review of Systems  Constitutional: Positive for fatigue. Negative for fever, chills and diaphoresis.   HENT: Negative for mouth sores and sore throat.   Eyes: Negative for visual disturbance.   Respiratory: Negative for cough, choking and shortness of breath.   +SOB with activity but not at rest   Cardiovascular: Negative for chest pain, palpitations and leg swelling.   Gastrointestinal: Negative for nausea, vomiting, abdominal pain, diarrhea, constipation and abdominal distention.   Musculoskeletal: Positive for arthralgias. Negative for myalgias, back pain, joint swelling, gait problem and neck pain.   Skin: Negative for pallor, rash and wound.   Neurological: Negative for dizziness, speech difficulty, light-headedness, numbness and headaches.   Hematological: Negative for adenopathy. Does not bruise/bleed easily.   Psychiatric/Behavioral: Negative for confusion and agitation.     Objective:     Filed Vitals:    11/13/13 1519  BP: 171/88   Pulse: 74   Temp: 36.2 C (97.2 F)   Resp: 18   Height: 166 cm (5' 5.35")   Weight: 118.45 kg (261 lb 2.2 oz)       No results found for this or any previous visit (from the past 72 hour(s)).    Labs      Lab results: 11/13/13  0709  09/10/13  1139   WBC 45.3*  < > 49.7*   HEMOGLOBIN 12.1  < > 12.3   HEMATOCRIT 38  < > 39   RBC 4.0  < > 4.3   PLATELETS 163  < > 176   NEUT # K/UL 34.0*  < > 34.3*   LYMPH # K/UL 3.2  < > 2.0   MONO # K/UL 1.8*  < > 0.8   EOS # K/UL 0.0  < > 0.0   BASO # K/UL 0.0  < > 0.0   SEG NEUT % 61.0  < > 59.8   LYMPHOCYTE % 7.0  < > 3.9   MONOCYTE % 4.0  < > 1.6   EOSINOPHIL % 0.0  < > 0.0   BASOPHIL % 0.0  < > 0.0   BANDS % 14*  < > 9   MYELOCYTE % 6*  < > 12*   METAMYELOCYTE % 8*  < > 12*   BLASTS %  --   --  2*   < > = values in this interval not  displayed.        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    Bone marrow biopsy (09/22/13): Bone marrow aspirate, core biopsy, peripheral blood: - Leukocytosis most consistent with a myeloproliferative neoplasm, unclassifiable. (see comment) Jak 2 negative. A. Short arm of chromosome Xp11.21 segment, 1.6 Mb in size (UPDXp); and B. Long arm of chromosome Xq11.1q13.3 segment, 12.4 Mb in size (UPDXq);    10/23: Blood Bcr/abl negaitve     Physical Exam  Constitutional: She is oriented to person, place, and time. She appears well-developed and well-nourished.   Overweight   HENT:   Head: Normocephalic and atraumatic.   Mouth/Throat: No oropharyngeal exudate.   Eyes: EOM are normal. Pupils are equal, round, and reactive to light.   Neck: Normal range of motion. Neck supple.   Cardiovascular: Normal rate, regular rhythm and intact distal pulses.   Pulmonary/Chest: Effort normal and breath sounds normal. No respiratory distress. She has no wheezes.   Abdominal: Soft. Bowel sounds are normal. She exhibits no distension and no mass. There is no tenderness. There is no guarding.   No hepatosplenomegaly    Musculoskeletal: She exhibits no edema.   Lymphadenopathy:   She has no cervical adenopathy.   Neurological: She is alert and oriented to person, place, and time.   Skin: Skin is warm and dry. No rash noted. No erythema. No pallor.  Psychiatric: She has a normal mood and affect. Her behavior is normal.     Assessment:     [name] is a 53 year old female with newly diagnosed  unclassified myeloproliferative disorder, recent abdominal US shows mild splenomegaly.     Plan:     Leukocytosis from myeloproliferative disorder  - WBC stable since last visit, hydrea 500 mg daily, she is tolerating well  - Will monitor CBC every 4 weeks   - [facility], she is in agreement and signed consent today. We told her to expect a call regarding this in the next week or so after we touch base with her insurance.  - RTC in 4-5 weeks    Smoking cessation  - She is aware of our smoking cessation clinic, is not ready to quit at this time.      Patient seen and discussed with attending, Dr. Massie Maroon MD, PHD  Medical Oncology Fellow

## 2013-11-19 ENCOUNTER — Telehealth: Payer: Self-pay | Admitting: Hematology

## 2013-11-19 NOTE — Telephone Encounter (Signed)
Spoke with Carly Higgins-   Explained that it was not able to get to the bottom of things yet.   I have heard back from Dr Radford Pax- but still not clear on wiether we need consent- will route to [facility] and Dr Lytle Butte to address this tomorrow.   Pt states understanding of plan.

## 2013-11-20 NOTE — Telephone Encounter (Signed)
Pt will come in Monday 11/23/13 morning for blood work.  Explained to her that we have all the paper work we need - she does not have to fill out anything and will not be billed.  Lab informed that she will be in on 11/23/13 @ 7am.  Ginny in the Lab expressed understanding and knows the blood and paperwork will need to be sent out.

## 2013-11-23 ENCOUNTER — Encounter: Payer: Self-pay | Admitting: Gastroenterology

## 2013-11-23 LAB — COMPREHENSIVE METABOLIC PANEL
ALT: 23 U/L (ref 0–35)
AST: 29 U/L (ref 0–35)
Albumin: 4.6 g/dL (ref 3.5–5.2)
Alk Phos: 78 U/L (ref 35–105)
Anion Gap: 15 (ref 7–16)
Bilirubin,Total: 0.4 mg/dL (ref 0.0–1.2)
CO2: 24 mmol/L (ref 20–28)
Calcium: 9.1 mg/dL (ref 8.6–10.2)
Chloride: 102 mmol/L (ref 96–108)
Creatinine: 1.04 mg/dL — ABNORMAL HIGH (ref 0.51–0.95)
GFR,Black: 71 *
GFR,Caucasian: 61 *
Glucose: 98 mg/dL (ref 60–99)
Lab: 15 mg/dL (ref 6–20)
Potassium: 4.6 mmol/L (ref 3.3–5.1)
Sodium: 141 mmol/L (ref 133–145)
Total Protein: 7.3 g/dL (ref 6.3–7.7)

## 2013-11-23 LAB — DIFF MANUAL
Bands %: 10 % (ref 0–10)
Blasts %: 1 % — ABNORMAL HIGH (ref 0–0)
Diff Based On: 117 CELLS
Metamyelocyte %: 9 % — ABNORMAL HIGH (ref 0–1)
Myelocyte %: 16 % — ABNORMAL HIGH (ref 0–0)
Promyelocyte %: 2 % — ABNORMAL HIGH (ref 0–0)
React Lymph %: 2 % (ref 0–6)

## 2013-11-23 LAB — CBC AND DIFFERENTIAL
Baso # K/uL: 0 10*3/uL (ref 0.0–0.1)
Basophil %: 0 %
Eos # K/uL: 0 10*3/uL (ref 0.0–0.4)
Eosinophil %: 0 %
Hematocrit: 39 % (ref 34–45)
Hemoglobin: 12.1 g/dL (ref 11.2–15.7)
Lymph # K/uL: 4.8 10*3/uL — ABNORMAL HIGH (ref 1.2–3.7)
Lymphocyte %: 7.7 %
MCH: 30 pg/cell (ref 26–32)
MCHC: 31 g/dL — ABNORMAL LOW (ref 32–36)
MCV: 96 fL — ABNORMAL HIGH (ref 79–95)
Mono # K/uL: 0.4 10*3/uL (ref 0.2–0.9)
Monocyte %: 0.9 %
Neut # K/uL: 29.6 10*3/uL — ABNORMAL HIGH (ref 1.6–6.1)
Nucl RBC # K/uL: 0.2 10*3/uL — ABNORMAL HIGH (ref 0.0–0.0)
Nucl RBC %: 0.5 /100 WBC — ABNORMAL HIGH (ref 0.0–0.2)
Platelets: 180 10*3/uL (ref 160–370)
RBC: 4.1 MIL/uL (ref 3.9–5.2)
RDW: 18.2 % — ABNORMAL HIGH (ref 11.7–14.4)
Seg Neut %: 52.1 %
WBC: 47.8 10*3/uL — ABNORMAL HIGH (ref 4.0–10.0)

## 2013-11-23 LAB — SPECIAL PROCEDURE-MISCELLANEOUS SENDOUT

## 2013-11-23 LAB — NEUTROPHIL #-INSTRUMENT: Neutrophil #-Instrument: 30.8 10*3/uL

## 2013-11-23 NOTE — Addendum Note (Signed)
Addended by: Rosalie Doctor on: 11/23/2013 07:35 AM     Modules accepted: Orders

## 2013-11-25 ENCOUNTER — Telehealth: Payer: Self-pay | Admitting: Hematology

## 2013-11-25 NOTE — Telephone Encounter (Signed)
CBC report faxed to [facility] as requested.

## 2013-12-07 ENCOUNTER — Telehealth: Payer: Self-pay | Admitting: Hematology

## 2013-12-08 ENCOUNTER — Telehealth: Payer: Self-pay | Admitting: Hematology

## 2013-12-08 NOTE — Telephone Encounter (Signed)
Labs aren't back yet - [name] will call her as soon as they are.

## 2013-12-14 LAB — SPECIAL PROCEDURE RESULTS

## 2014-01-18 ENCOUNTER — Ambulatory Visit
Admit: 2014-01-18 | Discharge: 2014-01-18 | Disposition: A | Discharge status: Home / Self Care | Payer: Self-pay | Source: Ambulatory Visit | Attending: Hematology | Admitting: Hematology

## 2014-01-18 DIAGNOSIS — D471 Chronic myeloproliferative disease: Secondary | ICD-10-CM

## 2014-01-18 DIAGNOSIS — D72829 Elevated white blood cell count, unspecified: Secondary | ICD-10-CM

## 2014-01-18 LAB — CBC AND DIFFERENTIAL
Baso # K/uL: 1.1 10*3/uL — ABNORMAL HIGH (ref 0.0–0.1)
Basophil %: 1.7 %
Eos # K/uL: 1.1 10*3/uL — ABNORMAL HIGH (ref 0.0–0.4)
Eosinophil %: 1.7 %
Hematocrit: 36 % (ref 34–45)
Hemoglobin: 11.5 g/dL (ref 11.2–15.7)
Lymph # K/uL: 2.6 10*3/uL (ref 1.2–3.7)
Lymphocyte %: 4.1 %
MCH: 31 pg/cell (ref 26–32)
MCHC: 32 g/dL (ref 32–36)
MCV: 97 fL — ABNORMAL HIGH (ref 79–95)
Mono # K/uL: 1.1 10*3/uL — ABNORMAL HIGH (ref 0.2–0.9)
Monocyte %: 1.7 %
Neut # K/uL: 44.7 10*3/uL — ABNORMAL HIGH (ref 1.6–6.1)
Nucl RBC # K/uL: 0.6 10*3/uL — ABNORMAL HIGH (ref 0.0–0.0)
Nucl RBC %: 0.9 /100 WBC — ABNORMAL HIGH (ref 0.0–0.2)
Platelets: 142 10*3/uL — ABNORMAL LOW (ref 160–370)
RBC: 3.7 MIL/uL — ABNORMAL LOW (ref 3.9–5.2)
RDW: 16.3 % — ABNORMAL HIGH (ref 11.7–14.4)
Seg Neut %: 59.4 %
WBC: 64.8 10*3/uL — ABNORMAL HIGH (ref 4.0–10.0)

## 2014-01-18 LAB — COMPREHENSIVE METABOLIC PANEL
ALT: 38 U/L — ABNORMAL HIGH (ref 0–35)
AST: 37 U/L — ABNORMAL HIGH (ref 0–35)
Albumin: 4.4 g/dL (ref 3.5–5.2)
Alk Phos: 65 U/L (ref 35–105)
Anion Gap: 16 (ref 7–16)
Bilirubin,Total: 0.4 mg/dL (ref 0.0–1.2)
CO2: 22 mmol/L (ref 20–28)
Calcium: 9.1 mg/dL (ref 8.6–10.2)
Chloride: 102 mmol/L (ref 96–108)
Creatinine: 1.02 mg/dL — ABNORMAL HIGH (ref 0.51–0.95)
GFR,Black: 72 *
GFR,Caucasian: 63 *
Glucose: 118 mg/dL — ABNORMAL HIGH (ref 60–99)
Lab: 13 mg/dL (ref 6–20)
Potassium: 4 mmol/L (ref 3.3–5.1)
Sodium: 140 mmol/L (ref 133–145)
Total Protein: 6.8 g/dL (ref 6.3–7.7)

## 2014-01-18 LAB — DIFF MANUAL
Bands %: 10 % (ref 0–10)
Diff Based On: 121 CELLS
Metamyelocyte %: 6 % — ABNORMAL HIGH (ref 0–1)
Myelocyte %: 13 % — ABNORMAL HIGH (ref 0–0)
Promyelocyte %: 3 % — ABNORMAL HIGH (ref 0–0)

## 2014-01-18 LAB — NEUTROPHIL #-INSTRUMENT: Neutrophil #-Instrument: 39.9 10*3/uL

## 2014-01-19 ENCOUNTER — Encounter: Payer: Self-pay | Admitting: Hematology

## 2014-01-19 ENCOUNTER — Ambulatory Visit: Payer: Self-pay | Admitting: Hematology

## 2014-01-19 VITALS — BP 139/67 | HR 73 | Temp 97.2°F | Resp 18 | Ht 65.35 in | Wt 262.1 lb

## 2014-01-19 DIAGNOSIS — IMO0002 Reserved for concepts with insufficient information to code with codable children: Secondary | ICD-10-CM | POA: Insufficient documentation

## 2014-01-19 DIAGNOSIS — D471 Chronic myeloproliferative disease: Secondary | ICD-10-CM | POA: Insufficient documentation

## 2014-01-19 LAB — URIC ACID: Urate: 6 mg/dL (ref 2.7–6.8)

## 2014-01-19 NOTE — Progress Notes (Signed)
Hematology/Oncology Progress Note     Subjective:      Patient ID: CHRISY HILLEBRAND is a 54 y.o. 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     HPI  Nunzio Cory returns follow up visit. She continues to do well. She is taking hydrea 500 mg once daily. Reports occasional nausea which does not bother her. Denies any skin rashes or ulcers. She had acute bronchitis during christmas time which required short course of antibiotics. She continues to smoke and is not ready to quit at this time.     Patient Active Problem List    Diagnosis Date Noted    MPN (myeloproliferative neoplasm) 01/19/2014    Leukocytosis 09/22/2013   No past surgical history on file.  Current Outpatient Prescriptions   Medication    hydroxyurea (HYDREA) 500 MG capsule    prochlorperazine (COMPAZINE) 10 MG tablet    escitalopram (LEXAPRO) 10 MG tablet    cholecalciferol (VITAMIN D) 2000 UNITS tablet     No current facility-administered medications for this visit.     Current Outpatient Prescriptions on File Prior to Visit   Medication Sig Dispense Refill    hydroxyurea (HYDREA) 500 MG capsule Take 1 capsule (500 mg total) by mouth daily 30 capsule 6    prochlorperazine (COMPAZINE) 10 MG tablet Take 1 tablet (10 mg total) by mouth 4 times daily as needed for Nausea 40 tablet 2    escitalopram (LEXAPRO) 10 MG tablet Take 15 mg by mouth daily      cholecalciferol (VITAMIN D) 2000 UNITS tablet Take 2,000 Units by mouth daily       No current facility-administered medications on file prior to visit.   No Known Allergies (drug, envir, food or latex)} ,   Family History   Problem Relation Age of Onset    No Known Problems Mother     No Known Problems Father    ,   History     Social History    Marital Status: Divorced     Spouse Name: N/A     Number of Children: N/A    Years of Education: N/A     Social History Main Topics    Smoking status: Current Every Day Smoker -- 0.50 packs/day for 30 years    Smokeless  tobacco: None    Alcohol Use: Yes      Comment: occassional    Drug Use: No    Sexual Activity: None     Other Topics Concern    None     Social History Narrative    and   Current Outpatient Prescriptions   Medication    hydroxyurea (HYDREA) 500 MG capsule    prochlorperazine (COMPAZINE) 10 MG tablet    escitalopram (LEXAPRO) 10 MG tablet    cholecalciferol (VITAMIN D) 2000 UNITS tablet     No current facility-administered medications for this visit.       Review of Systems  Constitutional: Negative for fever, chills and diaphoresis.   HENT: Negative for mouth sores and sore throat.   Eyes: Negative for visual disturbance.   Respiratory: Negative for cough, choking and shortness of breath.   Cardiovascular: Negative for chest pain, palpitations and leg swelling.   Gastrointestinal: Positive for nausea. Negative for vomiting, abdominal pain, diarrhea, constipation and abdominal distention.   Musculoskeletal: Positive for arthralgias. Negative for myalgias, back pain, joint swelling, gait problem and neck pain.   Skin: Negative for pallor, rash and wound.   Neurological: Negative for dizziness,  speech difficulty, light-headedness, numbness and headaches.   Hematological: Negative for adenopathy. Does not bruise/bleed easily.   Psychiatric/Behavioral: Negative for confusion and agitation.     Objective:     Filed Vitals:    01/19/14 1030   BP: 139/67   Pulse: 73   Temp: 36.2 C (97.2 F)   Resp: 18   Height: 166 cm (5' 5.35")   Weight: 118.9 kg (262 lb 2 oz)       Recent Results (from the past 72 hour(s))   CBC and differential    Collection Time: 01/18/14  7:12 AM   Result Value Ref Range    WBC 64.8 (H) 4.0 - 10.0 THOU/uL    RBC 3.7 (L) 3.9 - 5.2 MIL/uL    Hemoglobin 11.5 11.2 - 15.7 g/dL    Hematocrit 36 34 - 45 %    MCV 97 (H) 79 - 95 fL    MCH 31 26 - 32 pg/cell    MCHC 32 32 - 36 g/dL    RDW 16.3 (H) 11.7 - 14.4 %    Platelets 142 (L) 160 - 370 THOU/uL    Seg Neut % 59.4 %    Lymphocyte % 4.1 %     Monocyte % 1.7 %    Eosinophil % 1.7 %    Basophil % 1.7 %    Neut # K/uL 44.7 (H) 1.6 - 6.1 THOU/uL    Lymph # K/uL 2.6 1.2 - 3.7 THOU/uL    Mono # K/uL 1.1 (H) 0.2 - 0.9 THOU/uL    Eos # K/uL 1.1 (H) 0.0 - 0.4 THOU/uL    Baso # K/uL 1.1 (H) 0.0 - 0.1 THOU/uL    Nucl RBC % 0.9 (H) 0.0 - 0.2 /100 WBC    Nucl RBC # K/uL 0.6 (H) 0.0 - 0.0 THOU/uL   Comprehensive metabolic panel    Collection Time: 01/18/14  7:12 AM   Result Value Ref Range    Sodium 140 133 - 145 mmol/L    Potassium 4.0 3.3 - 5.1 mmol/L    Chloride 102 96 - 108 mmol/L    CO2 22 20 - 28 mmol/L    Anion Gap 16 7 - 16    UN 13 6 - 20 mg/dL    Creatinine 1.02 (H) 0.51 - 0.95 mg/dL    GFR,Caucasian 63 *    GFR,Black 72 *    Glucose 118 (H) 60 - 99 mg/dL    Calcium 9.1 8.6 - 10.2 mg/dL    Total Protein 6.8 6.3 - 7.7 g/dL    Albumin 4.4 3.5 - 5.2 g/dL    Bilirubin,Total 0.4 0.0 - 1.2 mg/dL    AST 37 (H) 0 - 35 U/L    ALT 38 (H) 0 - 35 U/L    Alk Phos 65 35 - 105 U/L   Neutrophil #-Preliminary    Collection Time: 01/18/14  7:12 AM   Result Value Ref Range    Neutrophil #-Preliminary 39.9 THOU/uL   Diff manual    Collection Time: 01/18/14  7:12 AM   Result Value Ref Range    Bands % 10 0 - 10 %    Metamyelocyte % 6 (H) 0 - 1 %    Myelocyte % 13 (H) 0 - 0 %    Promyelocyte % 3 (H) 0 - 0 %    Giant PLTs Present     Manual DIFF RESULTS     Diff Based On 121 CELLS  Uric acid    Collection Time: 01/18/14  7:12 AM   Result Value Ref Range    Urate 6.0 2.7 - 6.8 mg/dL     Bone marrow biopsy: SNP array analysis shows a total of 14.0 Mb homozygosity in the  following chromosomal regions which is 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    Bone marrow biopsy (09/22/13): Bone marrow aspirate, core biopsy, peripheral blood: - Leukocytosis most consistent with a myeloproliferative neoplasm, unclassifiable. (see comment) Jak 2 negative. A. Short arm of chromosome Xp11.21 segment, 1.6 Mb in size (UPDXp); and B.  Long arm of chromosome Xq11.1q13.3 segment, 12.4 Mb in size (UPDXq);    10/23: Blood Bcr/abl negaitve     Physical Exam  Constitutional: She is oriented to person, place, and time. She appears well-developed and well-nourished.   Overweight   HENT:   Head: Normocephalic and atraumatic.   Mouth/Throat: No oropharyngeal exudate.   Eyes: EOM are normal. Pupils are equal, round, and reactive to light.   Neck: Normal range of motion. Neck supple.   Cardiovascular: Normal rate, regular rhythm and intact distal pulses.   Pulmonary/Chest: Effort normal and breath sounds normal. No respiratory distress. She has no wheezes.   Abdominal: Soft. Bowel sounds are normal. She exhibits no distension and no mass. There is no tenderness. There is no guarding.   No hepatosplenomegaly    Musculoskeletal: She exhibits no edema.   Lymphadenopathy:   She has no cervical adenopathy.   Neurological: She is alert and oriented to person, place, and time.   Skin: Skin is warm and dry. No rash noted. No erythema. No pallor.   Psychiatric: She has a normal mood and affect. Her behavior is normal.     Assessment:     Ms. Woodyard is a 54 year old female with unclassified myeloproliferative disorder. She is on hydrea 500 mg daily and wbc count has increased to 64. Hematocrit is normal and platelet count is slightly low but remains stable. We will increase hydrea to 500 mg po bid to control leukocytosis.     Plan:   - Increase hydrea to 500 mg po bid.  - Monitor CBC and CMP every 2 weeks for now.  - Will order for HLA typing, as we may consider stem cell transplant down the road.    - RTC in 4 weeks  - Discussed smoking cessation again today with her. She is aware of our smoking cessation clinic, is not ready to quit at this time.      Patient seen and discussed with attending, Dr. Assunta Curtis    Claudette Stapler, MBBS  Med-Oncology/ Hematology Fellow

## 2014-02-02 ENCOUNTER — Other Ambulatory Visit: Payer: Self-pay | Admitting: Hematology

## 2014-02-02 MED ORDER — HYDROXYUREA 500 MG PO CAPS *I*
500.0000 mg | ORAL_CAPSULE | Freq: Two times a day (BID) | ORAL | Status: AC
Start: 2014-02-02 — End: 2014-03-04

## 2014-02-02 NOTE — Telephone Encounter (Signed)
Hydroxyuria refilled.

## 2014-02-08 ENCOUNTER — Ambulatory Visit
Admit: 2014-02-08 | Discharge: 2014-02-08 | Disposition: A | Discharge status: Home / Self Care | Payer: Self-pay | Source: Ambulatory Visit | Attending: Hematology | Admitting: Hematology

## 2014-02-08 ENCOUNTER — Telehealth: Payer: Self-pay

## 2014-02-08 DIAGNOSIS — D72829 Elevated white blood cell count, unspecified: Secondary | ICD-10-CM

## 2014-02-08 DIAGNOSIS — D471 Chronic myeloproliferative disease: Secondary | ICD-10-CM

## 2014-02-08 LAB — COMPREHENSIVE METABOLIC PANEL
ALT: 25 U/L (ref 0–35)
AST: 40 U/L — ABNORMAL HIGH (ref 0–35)
Albumin: 4.6 g/dL (ref 3.5–5.2)
Alk Phos: 65 U/L (ref 35–105)
Anion Gap: 14 (ref 7–16)
Bilirubin,Total: 0.4 mg/dL (ref 0.0–1.2)
CO2: 24 mmol/L (ref 20–28)
Calcium: 9.4 mg/dL (ref 8.6–10.2)
Chloride: 103 mmol/L (ref 96–108)
Creatinine: 1.06 mg/dL — ABNORMAL HIGH (ref 0.51–0.95)
GFR,Black: 69 *
GFR,Caucasian: 60 *
Glucose: 100 mg/dL — ABNORMAL HIGH (ref 60–99)
Lab: 22 mg/dL — ABNORMAL HIGH (ref 6–20)
Potassium: 4.5 mmol/L (ref 3.3–5.1)
Sodium: 141 mmol/L (ref 133–145)
Total Protein: 7.2 g/dL (ref 6.3–7.7)

## 2014-02-08 LAB — CBC AND DIFFERENTIAL
Baso # K/uL: 0 10*3/uL (ref 0.0–0.1)
Basophil %: 0 %
Eos # K/uL: 0 10*3/uL (ref 0.0–0.4)
Eosinophil %: 0 %
Hematocrit: 35 % (ref 34–45)
Hemoglobin: 11.5 g/dL (ref 11.2–15.7)
Lymph # K/uL: 4.7 10*3/uL — ABNORMAL HIGH (ref 1.2–3.7)
Lymphocyte %: 6.5 %
MCH: 32 pg/cell (ref 26–32)
MCHC: 33 g/dL (ref 32–36)
MCV: 98 fL — ABNORMAL HIGH (ref 79–95)
Mono # K/uL: 0.4 10*3/uL (ref 0.2–0.9)
Monocyte %: 0.8 %
Neut # K/uL: 34.3 10*3/uL — ABNORMAL HIGH (ref 1.6–6.1)
Nucl RBC # K/uL: 0.3 10*3/uL — ABNORMAL HIGH (ref 0.0–0.0)
Nucl RBC %: 0.6 /100 WBC — ABNORMAL HIGH (ref 0.0–0.2)
Platelets: 142 10*3/uL — ABNORMAL LOW (ref 160–370)
RBC: 3.6 MIL/uL — ABNORMAL LOW (ref 3.9–5.2)
RDW: 17.2 % — ABNORMAL HIGH (ref 11.7–14.4)
Seg Neut %: 54.5 %
WBC: 52.7 10*3/uL — ABNORMAL HIGH (ref 4.0–10.0)

## 2014-02-08 LAB — DIFF MANUAL
Bands %: 10 % (ref 0–10)
Diff Based On: 123 CELLS
Metamyelocyte %: 9 % — ABNORMAL HIGH (ref 0–1)
Myelocyte %: 17 % — ABNORMAL HIGH (ref 0–0)
Promyelocyte %: 1 % — ABNORMAL HIGH (ref 0–0)
React Lymph %: 2 % (ref 0–6)

## 2014-02-08 LAB — NEUTROPHIL #-INSTRUMENT: Neutrophil #-Instrument: 33.5 10*3/uL

## 2014-02-08 NOTE — Telephone Encounter (Signed)
Left message to inform pt of wbc 52, hct 35, plt 142 - continue HU to 500mg  BID, per Dr.Lliesveld.

## 2014-02-09 LAB — PRA ID CLASS I: PRA Percent Class I: 0

## 2014-02-16 ENCOUNTER — Ambulatory Visit: Payer: Self-pay | Admitting: Hematology

## 2014-02-23 ENCOUNTER — Ambulatory Visit
Admit: 2014-02-23 | Discharge: 2014-02-23 | Disposition: A | Discharge status: Home / Self Care | Payer: Self-pay | Source: Ambulatory Visit | Attending: Hematology | Admitting: Hematology

## 2014-02-23 ENCOUNTER — Telehealth: Payer: Self-pay

## 2014-02-23 ENCOUNTER — Ambulatory Visit: Payer: Self-pay | Admitting: Hematology

## 2014-02-23 ENCOUNTER — Telehealth: Payer: Self-pay | Admitting: Hematology

## 2014-02-23 DIAGNOSIS — D72829 Elevated white blood cell count, unspecified: Secondary | ICD-10-CM

## 2014-02-23 DIAGNOSIS — D471 Chronic myeloproliferative disease: Secondary | ICD-10-CM

## 2014-02-23 LAB — CBC AND DIFFERENTIAL
Baso # K/uL: 1.3 10*3/uL — ABNORMAL HIGH (ref 0.0–0.1)
Basophil %: 3.4 %
Eos # K/uL: 0 10*3/uL (ref 0.0–0.4)
Eosinophil %: 0 %
Hematocrit: 38 % (ref 34–45)
Hemoglobin: 12 g/dL (ref 11.2–15.7)
Lymph # K/uL: 2 10*3/uL (ref 1.2–3.7)
Lymphocyte %: 5 %
MCH: 32 pg/cell (ref 26–32)
MCHC: 32 g/dL (ref 32–36)
MCV: 101 fL — ABNORMAL HIGH (ref 79–95)
Mono # K/uL: 0.3 10*3/uL (ref 0.2–0.9)
Monocyte %: 0.8 %
Neut # K/uL: 29.6 10*3/uL — ABNORMAL HIGH (ref 1.6–6.1)
Nucl RBC # K/uL: 0.2 10*3/uL — ABNORMAL HIGH (ref 0.0–0.0)
Nucl RBC %: 0.6 /100 WBC — ABNORMAL HIGH (ref 0.0–0.2)
Platelets: 154 10*3/uL — ABNORMAL LOW (ref 160–370)
RBC: 3.7 MIL/uL — ABNORMAL LOW (ref 3.9–5.2)
RDW: 18.1 % — ABNORMAL HIGH (ref 11.7–14.4)
Seg Neut %: 57.2 %
WBC: 39.5 10*3/uL — ABNORMAL HIGH (ref 4.0–10.0)

## 2014-02-23 LAB — DIFF MANUAL
Bands %: 18 % (ref 0–10)
Blasts %: 1 % — ABNORMAL HIGH (ref 0–0)
Diff Based On: 119 CELLS
Metamyelocyte %: 3 % — ABNORMAL HIGH (ref 0–1)
Myelocyte %: 8 % — ABNORMAL HIGH (ref 0–0)
Promyelocyte %: 4 % — ABNORMAL HIGH (ref 0–0)

## 2014-02-23 LAB — COMPREHENSIVE METABOLIC PANEL
ALT: 23 U/L (ref 0–35)
AST: 33 U/L (ref 0–35)
Albumin: 4.7 g/dL (ref 3.5–5.2)
Alk Phos: 69 U/L (ref 35–105)
Anion Gap: 14 (ref 7–16)
Bilirubin,Total: 0.5 mg/dL (ref 0.0–1.2)
CO2: 25 mmol/L (ref 20–28)
Calcium: 9.4 mg/dL (ref 8.6–10.2)
Chloride: 103 mmol/L (ref 96–108)
Creatinine: 0.94 mg/dL (ref 0.51–0.95)
GFR,Black: 80 *
GFR,Caucasian: 69 *
Glucose: 110 mg/dL — ABNORMAL HIGH (ref 60–99)
Lab: 18 mg/dL (ref 6–20)
Potassium: 5 mmol/L (ref 3.3–5.1)
Sodium: 142 mmol/L (ref 133–145)
Total Protein: 7.2 g/dL (ref 6.3–7.7)

## 2014-02-23 LAB — NEUTROPHIL #-INSTRUMENT: Neutrophil #-Instrument: 25.5 10*3/uL

## 2014-02-23 NOTE — Telephone Encounter (Signed)
Critical lab value called into triage line     Name of provider:  Liesveld/jShanto     Time provider paged:[id number]     Time provider responded: 1032     Critical lab and value: Bands 17.6     Action taken as result of notification: Provider paged and results given to [facility]

## 2014-02-23 NOTE — Telephone Encounter (Signed)
Called patient and r/s to 3/1

## 2014-02-24 ENCOUNTER — Telehealth: Payer: Self-pay | Admitting: Hematology

## 2014-02-24 NOTE — Telephone Encounter (Signed)
-----   Message from Assunta Curtis, MD sent at 02/23/2014  5:56 PM EST -----  Can you let her know WBC better?  Continue hydroxyurea at same dose.

## 2014-02-24 NOTE — Telephone Encounter (Signed)
Called patient to inform her that WBC is 39.5 now.  Instructed per Dr.Liesveld to continue hydroxyurea at same dose - 500mg  BID.  Patient verbalized good understanding of instructions and is in agreement with plan.

## 2014-03-09 ENCOUNTER — Ambulatory Visit: Payer: Self-pay | Admitting: Hematology

## 2014-03-09 ENCOUNTER — Ambulatory Visit
Admit: 2014-03-09 | Discharge: 2014-03-09 | Disposition: A | Discharge status: Home / Self Care | Payer: Self-pay | Source: Ambulatory Visit | Attending: Hematology | Admitting: Hematology

## 2014-03-09 ENCOUNTER — Encounter: Payer: Self-pay | Admitting: Hematology

## 2014-03-09 VITALS — BP 151/70 | HR 80 | Temp 96.1°F | Resp 20 | Ht 65.35 in | Wt 254.2 lb

## 2014-03-09 DIAGNOSIS — D72829 Elevated white blood cell count, unspecified: Secondary | ICD-10-CM

## 2014-03-09 DIAGNOSIS — D471 Chronic myeloproliferative disease: Secondary | ICD-10-CM

## 2014-03-09 DIAGNOSIS — C9210 Chronic myeloid leukemia, BCR/ABL-positive, not having achieved remission: Secondary | ICD-10-CM

## 2014-03-09 LAB — CBC AND DIFFERENTIAL
Baso # K/uL: 0.5 10*3/uL — ABNORMAL HIGH (ref 0.0–0.1)
Basophil %: 1.7 %
Eos # K/uL: 0 10*3/uL (ref 0.0–0.4)
Eosinophil %: 0 %
Hematocrit: 36 % (ref 34–45)
Hemoglobin: 11.8 g/dL (ref 11.2–15.7)
Lymph # K/uL: 1.6 10*3/uL (ref 1.2–3.7)
Lymphocyte %: 5 %
MCH: 33 pg/cell — ABNORMAL HIGH (ref 26–32)
MCHC: 33 g/dL (ref 32–36)
MCV: 101 fL — ABNORMAL HIGH (ref 79–95)
Mono # K/uL: 0.3 10*3/uL (ref 0.2–0.9)
Monocyte %: 0.8 %
Neut # K/uL: 22.9 10*3/uL — ABNORMAL HIGH (ref 1.6–6.1)
Nucl RBC # K/uL: 0.1 10*3/uL — ABNORMAL HIGH (ref 0.0–0.0)
Nucl RBC %: 0.4 /100 WBC — ABNORMAL HIGH (ref 0.0–0.2)
Platelets: 148 10*3/uL — ABNORMAL LOW (ref 160–370)
RBC: 3.6 MIL/uL — ABNORMAL LOW (ref 3.9–5.2)
RDW: 17.9 % — ABNORMAL HIGH (ref 11.7–14.4)
Seg Neut %: 66.4 %
WBC: 31.8 10*3/uL — ABNORMAL HIGH (ref 4.0–10.0)

## 2014-03-09 LAB — COMPREHENSIVE METABOLIC PANEL
ALT: 19 U/L (ref 0–35)
AST: 29 U/L (ref 0–35)
Albumin: 4.4 g/dL (ref 3.5–5.2)
Alk Phos: 64 U/L (ref 35–105)
Anion Gap: 14 (ref 7–16)
Bilirubin,Total: 0.4 mg/dL (ref 0.0–1.2)
CO2: 24 mmol/L (ref 20–28)
Calcium: 9.1 mg/dL (ref 8.6–10.2)
Chloride: 103 mmol/L (ref 96–108)
Creatinine: 0.94 mg/dL (ref 0.51–0.95)
GFR,Black: 80 *
GFR,Caucasian: 69 *
Glucose: 108 mg/dL — ABNORMAL HIGH (ref 60–99)
Lab: 19 mg/dL (ref 6–20)
Potassium: 4.7 mmol/L (ref 3.3–5.1)
Sodium: 141 mmol/L (ref 133–145)
Total Protein: 7 g/dL (ref 6.3–7.7)

## 2014-03-09 LAB — DIFF MANUAL
Bands %: 6 % (ref 0–10)
Blasts %: 3 % — ABNORMAL HIGH (ref 0–0)
Diff Based On: 119 CELLS
Metamyelocyte %: 14 % — ABNORMAL HIGH (ref 0–1)
Myelocyte %: 3 % — ABNORMAL HIGH (ref 0–0)

## 2014-03-09 LAB — NEUTROPHIL #-INSTRUMENT: Neutrophil #-Instrument: 20.7 10*3/uL

## 2014-03-09 MED ORDER — HYDROXYUREA 500 MG PO CAPS *I*
1500.0000 mg | ORAL_CAPSULE | Freq: Every day | ORAL | Status: DC
Start: 2014-03-09 — End: 2014-07-16

## 2014-03-11 NOTE — Progress Notes (Signed)
Hematology Clinic Progress Note     Subjective:      Patient ID: Carly Higgins is a 54 y.o. 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     HPI  Carly Higgins is here for scheduled follow up visit. She continues to feel well and denies any complaint. She is taking hydrea 500 mg po bid. Denies any skin rashes or ulcers. Denies any nausea, vomiting. No fever, chills or recurrent infections.     Patient Active Problem List   Diagnosis Code    Leukocytosis D72.829    MPN (myeloproliferative neoplasm) D47.1     Outpatient Prescriptions Marked as Taking for the 03/09/14 encounter (Office Visit) with Assunta Curtis, MD   Medication Sig Dispense Refill    hydroxyurea (HYDREA) 500 MG capsule Take 3 capsules (1,500 mg total) by mouth daily 90 capsule 2    [DISCONTINUED] hydroxyurea (HYDREA) 500 MG capsule Take 500 mg by mouth 2 times daily      prochlorperazine (COMPAZINE) 10 MG tablet Take 1 tablet (10 mg total) by mouth 4 times daily as needed for Nausea 40 tablet 2    escitalopram (LEXAPRO) 10 MG tablet Take 15 mg by mouth daily      cholecalciferol (VITAMIN D) 2000 UNITS tablet Take 2,000 Units by mouth daily       Past medical and surgical history, medications, allergies, social and family history were reviewed and updated in eRecord.    Review of Systems  Constitutional: Negative for fever, chills and diaphoresis.   HENT: Negative for mouth sores and sore throat.   Eyes: Negative for visual disturbance.   Respiratory: Negative for cough, choking and shortness of breath.   Cardiovascular: Negative for chest pain, palpitations and leg swelling.   Gastrointestinal: Positive for nausea. Negative for vomiting, abdominal pain, diarrhea, constipation and abdominal distention.   Musculoskeletal: Positive for arthralgias. Negative for myalgias, back pain, joint swelling, gait problem and neck pain.   Skin: Negative for pallor, rash and wound.   Neurological: Negative for dizziness, speech  difficulty, light-headedness, numbness and headaches.   Hematological: Negative for adenopathy. Does not bruise/bleed easily.   Psychiatric/Behavioral: Negative for confusion and agitation.     Objective:     Filed Vitals:    03/09/14 1135   BP: 151/70   Pulse: 80   Temp: 35.6 C (96.1 F)   Resp: 20   Height: 166 cm (5' 5.35")   Weight: 115.3 kg (254 lb 3.1 oz)       Recent Results (from the past 72 hour(s))   CBC and differential    Collection Time: 03/09/14  7:11 AM   Result Value Ref Range    WBC 31.8 (H) 4.0 - 10.0 THOU/uL    RBC 3.6 (L) 3.9 - 5.2 MIL/uL    Hemoglobin 11.8 11.2 - 15.7 g/dL    Hematocrit 36 34 - 45 %    MCV 101 (H) 79 - 95 fL    MCH 33 (H) 26 - 32 pg/cell    MCHC 33 32 - 36 g/dL    RDW 17.9 (H) 11.7 - 14.4 %    Platelets 148 (L) 160 - 370 THOU/uL    Seg Neut % 66.4 %    Lymphocyte % 5.0 %    Monocyte % 0.8 %    Eosinophil % 0.0 %    Basophil % 1.7 %    Neut # K/uL 22.9 (H) 1.6 - 6.1 THOU/uL    Lymph # K/uL 1.6 1.2 -  3.7 THOU/uL    Mono # K/uL 0.3 0.2 - 0.9 THOU/uL    Eos # K/uL 0.0 0.0 - 0.4 THOU/uL    Baso # K/uL 0.5 (H) 0.0 - 0.1 THOU/uL    Nucl RBC % 0.4 (H) 0.0 - 0.2 /100 WBC    Nucl RBC # K/uL 0.1 (H) 0.0 - 0.0 THOU/uL   Comprehensive metabolic panel    Collection Time: 03/09/14  7:11 AM   Result Value Ref Range    Sodium 141 133 - 145 mmol/L    Potassium 4.7 3.3 - 5.1 mmol/L    Chloride 103 96 - 108 mmol/L    CO2 24 20 - 28 mmol/L    Anion Gap 14 7 - 16    UN 19 6 - 20 mg/dL    Creatinine 0.94 0.51 - 0.95 mg/dL    GFR,Caucasian 69 *    GFR,Black 80 *    Glucose 108 (H) 60 - 99 mg/dL    Calcium 9.1 8.6 - 10.2 mg/dL    Total Protein 7.0 6.3 - 7.7 g/dL    Albumin 4.4 3.5 - 5.2 g/dL    Bilirubin,Total 0.4 0.0 - 1.2 mg/dL    AST 29 0 - 35 U/L    ALT 19 0 - 35 U/L    Alk Phos 64 35 - 105 U/L   Diff manual    Collection Time: 03/09/14  7:11 AM   Result Value Ref Range    Bands % 6 0 - 10 %    Metamyelocyte % 14 (H) 0 - 1 %    Myelocyte % 3 (H) 0 - 0 %    Blasts % 3 (H) 0 - 0 %    Giant PLTs  Present     Manual DIFF RESULTS     Diff Based On 119 CELLS   Neutrophil #-Preliminary    Collection Time: 03/09/14  7:11 AM   Result Value Ref Range    Neutrophil #-Preliminary 20.7 THOU/uL     Bone marrow biopsy: SNP array analysis shows a total of 14.0 Mb homozygosity in the  following chromosomal regions which is 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    Bone marrow biopsy (09/22/13): Bone marrow aspirate, core biopsy, peripheral blood: - Leukocytosis most consistent with a myeloproliferative neoplasm, unclassifiable. (see comment) Jak 2 negative. A. Short arm of chromosome Xp11.21 segment, 1.6 Mb in size (UPDXp); and B. Long arm of chromosome Xq11.1q13.3 segment, 12.4 Mb in size (UPDXq);    10/23: Blood Bcr/abl negative    Physical Exam  Constitutional: She is oriented to person, place, and time. She appears well-developed and well-nourished.   Overweight   HENT:   Head: Normocephalic and atraumatic.   Mouth/Throat: No oropharyngeal exudate.   Eyes: EOM are normal. Pupils are equal, round, and reactive to light.   Neck: Normal range of motion. Neck supple.   Cardiovascular: Normal rate, regular rhythm and intact distal pulses.   Pulmonary/Chest: Effort normal and breath sounds normal. No respiratory distress. She has no wheezes.   Abdominal: Soft. Bowel sounds are normal. She exhibits no distension and no mass. There is no tenderness. There is no guarding.   No hepatosplenomegaly    Musculoskeletal: She exhibits no edema.   Lymphadenopathy:   She has no cervical adenopathy.   Neurological: She is alert and oriented to person, place, and time.   Skin: Skin is warm and dry. No rash  noted. No erythema. No pallor.   Psychiatric: She has a normal affect.     Assessment:     [name] is a 54 year old female with unclassified myeloproliferative disorder. She is on hydrea 500 mg po bid and wbc count has improved to 64 to 31.8. Hematocrit is normal and  platelet count is slightly low but remains stable. She is tolerating hydrea without any side effects. We will increase hydrea to 500 mg po tid to better control leukocytosis. Monitor count closely. We briefly discussed about possibility of allogenic stem cell transplant in future. She has siblings.      Plan:   - Increase hydrea to 500 mg po tid (refill sent to pharmacy).   - Monitor CBC and CMP every 2 weeks for now.  - RTC in 3 months. Advised to call for any concerns in meantime.   - Discussed smoking cessation again today with her. She is aware of our smoking cessation clinic, is not ready to quit at this time.      Patient seen and discussed with attending, Dr. Assunta Curtis    Claudette Stapler, MBBS  Med-Oncology/ Hematology Fellow

## 2014-03-23 LAB — DIFF MANUAL
Bands %: 4 % (ref 0–10)
Diff Based On: 116 CELLS
Metamyelocyte %: 2 % — ABNORMAL HIGH (ref 0–1)
Myelocyte %: 3 % — ABNORMAL HIGH (ref 0–0)
React Lymph %: 1 % (ref 0–6)

## 2014-03-23 LAB — COMPREHENSIVE METABOLIC PANEL
ALT: 20 U/L (ref 0–35)
AST: 29 U/L (ref 0–35)
Albumin: 4.7 g/dL (ref 3.5–5.2)
Alk Phos: 62 U/L (ref 35–105)
Anion Gap: 13 (ref 7–16)
Bilirubin,Total: 0.4 mg/dL (ref 0.0–1.2)
CO2: 26 mmol/L (ref 20–28)
Calcium: 9.1 mg/dL (ref 8.6–10.2)
Chloride: 103 mmol/L (ref 96–108)
Creatinine: 0.97 mg/dL — ABNORMAL HIGH (ref 0.51–0.95)
GFR,Black: 77 *
GFR,Caucasian: 67 *
Glucose: 103 mg/dL — ABNORMAL HIGH (ref 60–99)
Lab: 26 mg/dL — ABNORMAL HIGH (ref 6–20)
Potassium: 5 mmol/L (ref 3.3–5.1)
Sodium: 142 mmol/L (ref 133–145)
Total Protein: 7.2 g/dL (ref 6.3–7.7)

## 2014-03-23 LAB — CBC AND DIFFERENTIAL
Baso # K/uL: 0.5 10*3/uL — ABNORMAL HIGH (ref 0.0–0.1)
Basophil %: 2.6 %
Eos # K/uL: 0 10*3/uL (ref 0.0–0.4)
Eosinophil %: 0 %
Hematocrit: 38 % (ref 34–45)
Hemoglobin: 12.3 g/dL (ref 11.2–15.7)
Lymph # K/uL: 2.2 10*3/uL (ref 1.2–3.7)
Lymphocyte %: 11.2 %
MCH: 33 pg/cell — ABNORMAL HIGH (ref 26–32)
MCHC: 33 g/dL (ref 32–36)
MCV: 102 fL — ABNORMAL HIGH (ref 79–95)
Mono # K/uL: 0 10*3/uL — ABNORMAL LOW (ref 0.2–0.9)
Monocyte %: 0 %
Neut # K/uL: 14.6 10*3/uL — ABNORMAL HIGH (ref 1.6–6.1)
Nucl RBC # K/uL: 0 10*3/uL (ref 0.0–0.0)
Nucl RBC %: 0.2 /100 WBC (ref 0.0–0.2)
Platelets: 175 10*3/uL (ref 160–370)
RBC: 3.7 MIL/uL — ABNORMAL LOW (ref 3.9–5.2)
RDW: 17.4 % — ABNORMAL HIGH (ref 11.7–14.4)
Seg Neut %: 75.9 %
WBC: 18.3 10*3/uL — ABNORMAL HIGH (ref 4.0–10.0)

## 2014-03-23 LAB — NEUTROPHIL #-INSTRUMENT: Neutrophil #-Instrument: 12.9 10*3/uL

## 2014-03-23 NOTE — Addendum Note (Signed)
Addended by: Rosalie Doctor on: 03/23/2014 07:00 AM     Modules accepted: Orders

## 2014-03-24 ENCOUNTER — Telehealth: Payer: Self-pay | Admitting: Hematology

## 2014-03-24 DIAGNOSIS — D72829 Elevated white blood cell count, unspecified: Secondary | ICD-10-CM

## 2014-03-24 NOTE — Telephone Encounter (Signed)
Called pt to review labs: wbc 18.3, hct 38, plt 175  Instructed per Dr.Liesveld to decrease HU to 500mg  BID.  Pt expressed good understanding.  She will be going to [place] for 2 weeks and will repeat labs when she returns on 04/19/14.

## 2014-04-23 ENCOUNTER — Ambulatory Visit
Admit: 2014-04-23 | Discharge: 2014-04-23 | Disposition: A | Discharge status: Home / Self Care | Payer: Self-pay | Source: Ambulatory Visit | Attending: Hematology | Admitting: Hematology

## 2014-04-23 ENCOUNTER — Telehealth: Payer: Self-pay

## 2014-04-23 DIAGNOSIS — D72829 Elevated white blood cell count, unspecified: Secondary | ICD-10-CM

## 2014-04-23 LAB — CBC AND DIFFERENTIAL
Baso # K/uL: 0.5 10*3/uL — ABNORMAL HIGH (ref 0.0–0.1)
Basophil %: 0.8 %
Eos # K/uL: 0 10*3/uL (ref 0.0–0.4)
Eosinophil %: 0 %
Hematocrit: 37 % (ref 34–45)
Hemoglobin: 12.2 g/dL (ref 11.2–15.7)
Lymph # K/uL: 4.1 10*3/uL — ABNORMAL HIGH (ref 1.2–3.7)
Lymphocyte %: 7.4 %
MCH: 34 pg/cell — ABNORMAL HIGH (ref 26–32)
MCHC: 33 g/dL (ref 32–36)
MCV: 103 fL — ABNORMAL HIGH (ref 79–95)
Mono # K/uL: 0.5 10*3/uL (ref 0.2–0.9)
Monocyte %: 0.8 %
Neut # K/uL: 41.6 10*3/uL — ABNORMAL HIGH (ref 1.6–6.1)
Nucl RBC # K/uL: 0.3 10*3/uL — ABNORMAL HIGH (ref 0.0–0.0)
Nucl RBC %: 0.5 /100 WBC — ABNORMAL HIGH (ref 0.0–0.2)
Platelets: 124 10*3/uL — ABNORMAL LOW (ref 160–370)
RBC: 3.6 MIL/uL — ABNORMAL LOW (ref 3.9–5.2)
RDW: 15.7 % — ABNORMAL HIGH (ref 11.7–14.4)
Seg Neut %: 62.1 %
WBC: 57.8 10*3/uL — ABNORMAL HIGH (ref 4.0–10.0)

## 2014-04-23 LAB — DIFF MANUAL
Bands %: 10 % (ref 0–10)
Diff Based On: 121 CELLS
Metamyelocyte %: 6 % — ABNORMAL HIGH (ref 0–1)
Myelocyte %: 12 % — ABNORMAL HIGH (ref 0–0)
Promyelocyte %: 1 % — ABNORMAL HIGH (ref 0–0)

## 2014-04-23 LAB — COMPREHENSIVE METABOLIC PANEL
ALT: 25 U/L (ref 0–35)
AST: 40 U/L — ABNORMAL HIGH (ref 0–35)
Albumin: 4.8 g/dL (ref 3.5–5.2)
Alk Phos: 66 U/L (ref 35–105)
Anion Gap: 13 (ref 7–16)
Bilirubin,Total: 0.4 mg/dL (ref 0.0–1.2)
CO2: 26 mmol/L (ref 20–28)
Calcium: 9.3 mg/dL (ref 8.6–10.2)
Chloride: 104 mmol/L (ref 96–108)
Creatinine: 0.97 mg/dL — ABNORMAL HIGH (ref 0.51–0.95)
GFR,Black: 77 *
GFR,Caucasian: 66 *
Glucose: 114 mg/dL — ABNORMAL HIGH (ref 60–99)
Lab: 17 mg/dL (ref 6–20)
Potassium: 4.5 mmol/L (ref 3.3–5.1)
Sodium: 143 mmol/L (ref 133–145)
Total Protein: 7.2 g/dL (ref 6.3–7.7)

## 2014-04-23 LAB — NEUTROPHIL #-INSTRUMENT: Neutrophil #-Instrument: 37.3 10*3/uL

## 2014-04-23 NOTE — Telephone Encounter (Signed)
-----   Message from Assunta Curtis, MD sent at 04/23/2014  7:44 AM EDT -----  WBC is up.  Check that she has been able to take hydroxurea, and we will have to go up on dose by 500 mg/day if so.

## 2014-04-23 NOTE — Telephone Encounter (Signed)
wbc 57.8 , hct 34, plt 124 -   Instructed per Dr.Liesveld to increase HU to 500mg  am and 1000mg  pm  Repeat labs in 2 weeks.  Good understanding verbalized.

## 2014-04-23 NOTE — Telephone Encounter (Signed)
Left message to call back re: elevated WBC 57.8

## 2014-05-10 ENCOUNTER — Telehealth: Payer: Self-pay

## 2014-05-10 ENCOUNTER — Telehealth: Payer: Self-pay | Admitting: Hematology

## 2014-05-10 ENCOUNTER — Ambulatory Visit
Admit: 2014-05-10 | Discharge: 2014-05-10 | Disposition: A | Discharge status: Home / Self Care | Payer: Self-pay | Source: Ambulatory Visit | Attending: Hematology | Admitting: Hematology

## 2014-05-10 DIAGNOSIS — D72829 Elevated white blood cell count, unspecified: Secondary | ICD-10-CM

## 2014-05-10 LAB — CBC AND DIFFERENTIAL
Baso # K/uL: 0.3 10*3/uL — ABNORMAL HIGH (ref 0.0–0.1)
Basophil %: 0.9 %
Eos # K/uL: 0.3 10*3/uL (ref 0.0–0.4)
Eosinophil %: 0.9 %
Hematocrit: 34 % (ref 34–45)
Hemoglobin: 11.2 g/dL (ref 11.2–15.7)
Lymph # K/uL: 3.8 10*3/uL — ABNORMAL HIGH (ref 1.2–3.7)
Lymphocyte %: 7.8 %
MCH: 34 pg/cell — ABNORMAL HIGH (ref 26–32)
MCHC: 33 g/dL (ref 32–36)
MCV: 103 fL — ABNORMAL HIGH (ref 79–95)
Mono # K/uL: 0 10*3/uL — ABNORMAL LOW (ref 0.2–0.9)
Monocyte %: 0 %
Neut # K/uL: 25.8 10*3/uL — ABNORMAL HIGH (ref 1.6–6.1)
Nucl RBC # K/uL: 0.2 10*3/uL — ABNORMAL HIGH (ref 0.0–0.0)
Nucl RBC %: 0.5 /100 WBC — ABNORMAL HIGH (ref 0.0–0.2)
Platelets: 100 10*3/uL — ABNORMAL LOW (ref 160–370)
RBC: 3.3 MIL/uL — ABNORMAL LOW (ref 3.9–5.2)
RDW: 15.9 % — ABNORMAL HIGH (ref 11.7–14.4)
Seg Neut %: 57.6 %
WBC: 34.9 10*3/uL — ABNORMAL HIGH (ref 4.0–10.0)

## 2014-05-10 LAB — COMPREHENSIVE METABOLIC PANEL
ALT: 20 U/L (ref 0–35)
AST: 31 U/L (ref 0–35)
Albumin: 4.6 g/dL (ref 3.5–5.2)
Alk Phos: 59 U/L (ref 35–105)
Anion Gap: 13 (ref 7–16)
Bilirubin,Total: 0.4 mg/dL (ref 0.0–1.2)
CO2: 27 mmol/L (ref 20–28)
Calcium: 9.7 mg/dL (ref 8.6–10.2)
Chloride: 102 mmol/L (ref 96–108)
Creatinine: 0.89 mg/dL (ref 0.51–0.95)
GFR,Black: 85 *
GFR,Caucasian: 74 *
Glucose: 125 mg/dL — ABNORMAL HIGH (ref 60–99)
Lab: 12 mg/dL (ref 6–20)
Potassium: 4.3 mmol/L (ref 3.3–5.1)
Sodium: 142 mmol/L (ref 133–145)
Total Protein: 7 g/dL (ref 6.3–7.7)

## 2014-05-10 LAB — NEUTROPHIL #-INSTRUMENT: Neutrophil #-Instrument: 23.8 10*3/uL

## 2014-05-10 LAB — RBC MORPHOLOGY

## 2014-05-10 NOTE — Telephone Encounter (Signed)
Critical lab value called into triage line     Name of provider:Dr Lilly     Time provider paged:8:34     Time provider responded: 9:10     Critical lab and value: bands 16.4     Action taken as result of notification:team RN notified.

## 2014-05-10 NOTE — Telephone Encounter (Signed)
Called pt to review her labs - wbc 34.9, hct 34, plt 100   Dr.Liesveld said to continue HU - 1000mg  am and 500mg  pm  Good understanding verbalized.

## 2014-05-11 LAB — DIFF MANUAL
Bands %: 16 % (ref 0–10)
Diff Based On: 116 CELLS
Metamyelocyte %: 3 % — ABNORMAL HIGH (ref 0–1)
Myelocyte %: 11 % — ABNORMAL HIGH (ref 0–0)
React Lymph %: 3 % (ref 0–6)

## 2014-05-11 LAB — HEMATOPATHOLOGY REVIEW

## 2014-05-25 ENCOUNTER — Telehealth: Payer: Self-pay | Admitting: Hematology

## 2014-05-25 ENCOUNTER — Ambulatory Visit
Admit: 2014-05-25 | Discharge: 2014-05-25 | Disposition: A | Discharge status: Home / Self Care | Payer: Self-pay | Source: Ambulatory Visit | Attending: Hematology | Admitting: Hematology

## 2014-05-25 ENCOUNTER — Other Ambulatory Visit: Payer: Self-pay | Admitting: Oncology

## 2014-05-25 DIAGNOSIS — D72829 Elevated white blood cell count, unspecified: Secondary | ICD-10-CM

## 2014-05-25 LAB — DIFF MANUAL
Bands %: 8 % (ref 0–10)
Diff Based On: 119 CELLS
Metamyelocyte %: 6 % — ABNORMAL HIGH (ref 0–1)
Myelocyte %: 9 % — ABNORMAL HIGH (ref 0–0)

## 2014-05-25 LAB — COMPREHENSIVE METABOLIC PANEL
ALT: 20 U/L (ref 0–35)
AST: 28 U/L (ref 0–35)
Albumin: 4.5 g/dL (ref 3.5–5.2)
Alk Phos: 54 U/L (ref 35–105)
Anion Gap: 10 (ref 7–16)
Bilirubin,Total: 0.4 mg/dL (ref 0.0–1.2)
CO2: 28 mmol/L (ref 20–28)
Calcium: 8.9 mg/dL (ref 8.6–10.2)
Chloride: 104 mmol/L (ref 96–108)
Creatinine: 0.87 mg/dL (ref 0.51–0.95)
GFR,Black: 87 *
GFR,Caucasian: 76 *
Glucose: 100 mg/dL — ABNORMAL HIGH (ref 60–99)
Lab: 21 mg/dL — ABNORMAL HIGH (ref 6–20)
Potassium: 4.8 mmol/L (ref 3.3–5.1)
Sodium: 142 mmol/L (ref 133–145)
Total Protein: 6.9 g/dL (ref 6.3–7.7)

## 2014-05-25 LAB — CBC AND DIFFERENTIAL
Baso # K/uL: 0.3 10*3/uL — ABNORMAL HIGH (ref 0.0–0.1)
Basophil %: 1.7 %
Eos # K/uL: 0 10*3/uL (ref 0.0–0.4)
Eosinophil %: 0 %
Hematocrit: 34 % (ref 34–45)
Hemoglobin: 11.4 g/dL (ref 11.2–15.7)
Lymph # K/uL: 1.7 10*3/uL (ref 1.2–3.7)
Lymphocyte %: 9.2 %
MCH: 35 pg/cell — ABNORMAL HIGH (ref 26–32)
MCHC: 34 g/dL (ref 32–36)
MCV: 106 fL — ABNORMAL HIGH (ref 79–95)
Mono # K/uL: 0.2 10*3/uL (ref 0.2–0.9)
Monocyte %: 0.8 %
Neut # K/uL: 13.9 10*3/uL — ABNORMAL HIGH (ref 1.6–6.1)
Nucl RBC # K/uL: 0 10*3/uL (ref 0.0–0.0)
Nucl RBC %: 0.2 /100 WBC (ref 0.0–0.2)
Platelets: 101 10*3/uL — ABNORMAL LOW (ref 160–370)
RBC: 3.2 MIL/uL — ABNORMAL LOW (ref 3.9–5.2)
RDW: 16.8 % — ABNORMAL HIGH (ref 11.7–14.4)
Seg Neut %: 65.6 %
WBC: 18.8 10*3/uL — ABNORMAL HIGH (ref 4.0–10.0)

## 2014-05-25 LAB — NEUTROPHIL #-INSTRUMENT: Neutrophil #-Instrument: 12.2 10*3/uL

## 2014-05-25 NOTE — Telephone Encounter (Signed)
I spoke with the patient and stated that the WBC had dropped to 18.  She was wondering if this would change how she is taking her medication.  She talked about 2 versus 3 tablets and then asked if she should take them every other day.  I stated that I would send this question to Dr.Liesveld and her team.  She verbalized understanding.

## 2014-05-25 NOTE — Telephone Encounter (Signed)
WBC 18K.  Patient is currently taking Hydroxyurea 1500 mg daily.    Patient instructed to take 1500 mg alternating with 1000 mg daily.    Repeat CBC in 2 weeks.    Patient is aware to call with any further questions or concerns.

## 2014-05-31 ENCOUNTER — Telehealth: Payer: Self-pay | Admitting: Hematology

## 2014-05-31 NOTE — Telephone Encounter (Signed)
Returned patient's call.  She is currently taking Hydroxyurea HU 1500mg   alt. with 1000mg  the next day, as instructed.  She had blood work last week.  Instructed to continue to have labs drawn every 2 weeks.  Good understanding verbalized.

## 2014-06-02 ENCOUNTER — Other Ambulatory Visit: Payer: Self-pay

## 2014-06-02 DIAGNOSIS — Z1231 Encounter for screening mammogram for malignant neoplasm of breast: Secondary | ICD-10-CM

## 2014-06-09 ENCOUNTER — Telehealth: Payer: Self-pay

## 2014-06-09 ENCOUNTER — Ambulatory Visit
Admit: 2014-06-09 | Discharge: 2014-06-09 | Disposition: A | Discharge status: Home / Self Care | Payer: Self-pay | Source: Ambulatory Visit | Admitting: Hematology

## 2014-06-09 LAB — CBC AND DIFFERENTIAL
Baso # K/uL: 0 10*3/uL (ref 0.0–0.1)
Basophil %: 0 %
Eos # K/uL: 0.3 10*3/uL (ref 0.0–0.4)
Eosinophil %: 0.8 %
Hematocrit: 35 % (ref 34–45)
Hemoglobin: 11.4 g/dL (ref 11.2–15.7)
Lymph # K/uL: 2.2 10*3/uL (ref 1.2–3.7)
Lymphocyte %: 6.8 %
MCH: 35 pg/cell — ABNORMAL HIGH (ref 26–32)
MCHC: 32 g/dL (ref 32–36)
MCV: 107 fL — ABNORMAL HIGH (ref 79–95)
Mono # K/uL: 0.8 10*3/uL (ref 0.2–0.9)
Monocyte %: 2.5 %
Neut # K/uL: 22.8 10*3/uL — ABNORMAL HIGH (ref 1.6–6.1)
Nucl RBC # K/uL: 0.1 10*3/uL — ABNORMAL HIGH (ref 0.0–0.0)
Nucl RBC %: 0.2 /100 WBC (ref 0.0–0.2)
Platelets: 112 10*3/uL — ABNORMAL LOW (ref 160–370)
RBC: 3.3 MIL/uL — ABNORMAL LOW (ref 3.9–5.2)
RDW: 17 % — ABNORMAL HIGH (ref 11.7–14.4)
Seg Neut %: 67 %
WBC: 30.8 10*3/uL — ABNORMAL HIGH (ref 4.0–10.0)

## 2014-06-09 LAB — NEUTROPHIL #-INSTRUMENT: Neutrophil #-Instrument: 19.9 10*3/uL

## 2014-06-09 LAB — COMPREHENSIVE METABOLIC PANEL
ALT: 23 U/L (ref 0–35)
AST: 27 U/L (ref 0–35)
Albumin: 4.6 g/dL (ref 3.5–5.2)
Alk Phos: 56 U/L (ref 35–105)
Anion Gap: 14 (ref 7–16)
Bilirubin,Total: 0.4 mg/dL (ref 0.0–1.2)
CO2: 24 mmol/L (ref 20–28)
Calcium: 8.9 mg/dL (ref 8.6–10.2)
Chloride: 105 mmol/L (ref 96–108)
Creatinine: 0.79 mg/dL (ref 0.51–0.95)
GFR,Black: 98 *
GFR,Caucasian: 85 *
Glucose: 114 mg/dL — ABNORMAL HIGH (ref 60–99)
Lab: 21 mg/dL — ABNORMAL HIGH (ref 6–20)
Potassium: 4.6 mmol/L (ref 3.3–5.1)
Sodium: 143 mmol/L (ref 133–145)
Total Protein: 7.1 g/dL (ref 6.3–7.7)

## 2014-06-09 LAB — DIFF MANUAL
Bands %: 7 % (ref 0–10)
Diff Based On: 118 CELLS
Metamyelocyte %: 3 % — ABNORMAL HIGH (ref 0–1)
Myelocyte %: 12 % — ABNORMAL HIGH (ref 0–0)
Promyelocyte %: 2 % — ABNORMAL HIGH (ref 0–0)

## 2014-06-09 LAB — RBC MORPHOLOGY

## 2014-06-09 NOTE — Telephone Encounter (Signed)
Left message for pt - wbc 30.8  Per Dr.Liesveld: continue  HU - 1500mg  one day alt. with 1000 PM the next day.

## 2014-06-09 NOTE — Telephone Encounter (Signed)
-----   Message from Assunta Curtis, MD sent at 06/09/2014  7:40 AM EDT -----  WBC up to 30's.  Keep HU dose as is but if up more on next count, will need to increase back to 2 g /day.

## 2014-06-14 ENCOUNTER — Ambulatory Visit
Admit: 2014-06-14 | Discharge: 2014-06-14 | Disposition: A | Discharge status: Home / Self Care | Payer: Self-pay | Source: Ambulatory Visit | Attending: Internal Medicine | Admitting: Internal Medicine

## 2014-06-14 ENCOUNTER — Encounter: Payer: Self-pay | Admitting: Gastroenterology

## 2014-06-14 DIAGNOSIS — D72829 Elevated white blood cell count, unspecified: Secondary | ICD-10-CM

## 2014-06-15 ENCOUNTER — Encounter: Payer: Self-pay | Admitting: Oncology

## 2014-06-15 ENCOUNTER — Other Ambulatory Visit: Payer: Self-pay | Admitting: Gastroenterology

## 2014-06-15 ENCOUNTER — Other Ambulatory Visit: Payer: Self-pay | Admitting: Radiology

## 2014-06-15 ENCOUNTER — Ambulatory Visit: Payer: Self-pay | Admitting: Oncology

## 2014-06-15 VITALS — BP 178/73 | HR 74 | Temp 96.3°F | Resp 20 | Ht 65.35 in | Wt 249.7 lb

## 2014-06-15 DIAGNOSIS — D471 Chronic myeloproliferative disease: Secondary | ICD-10-CM

## 2014-06-15 NOTE — Progress Notes (Signed)
Subjective:      Patient ID: Carly Higgins is a 54 y.o. 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     HPI  Carly Higgins comes to the office today for her scheduled follow up.  She reports feeling hot and sweaty all the time.  No true fevers or infections.  No chest pain or shortness of breath.  No bleeding.  She is taking Hydroxyurea as prescribed.  No mouth sores.    Review of Systems   All other systems reviewed and are negative.    Objective:     Vitals:    06/15/14 1141   BP: 178/73   Pulse: 74   Resp: 20   Temp: 35.7 C (96.3 F)   Weight: 113.3 kg (249 lb 10.7 oz)   Height: 166 cm (5' 5.35")     Laboratory Data:  Results for Carly Higgins,  (MRN [id number]) as of 06/15/2014 16:27   Ref. Range 05/10/2014 07:15 05/25/2014 07:13 06/09/2014 07:07   WBC Latest Ref Range: 4.0 - 10.0 THOU/uL 34.9 (H) 18.8 (H) 30.8 (H)   RBC Latest Ref Range: 3.9 - 5.2 MIL/uL 3.3 (L) 3.2 (L) 3.3 (L)   Hemoglobin Latest Ref Range: 11.2 - 15.7 g/dL 11.2 11.4 11.4   Hematocrit Latest Ref Range: 34 - 45 % 34 34 35   MCV Latest Ref Range: 79 - 95 fL 103 (H) 106 (H) 107 (H)   MCH Latest Ref Range: 26 - 32 pg/cell 34 (H) 35 (H) 35 (H)   MCHC Latest Ref Range: 32 - 36 g/dL 33 34 32   RDW Latest Ref Range: 11.7 - 14.4 % 15.9 (H) 16.8 (H) 17.0 (H)   Platelets Latest Ref Range: 160 - 370 THOU/uL 100 (L) 101 (L) 112 (L)   Giant PLTs Unknown  Present Present   Manual DIFF Unknown RESULTS RESULTS RESULTS   Neut # K/uL Latest Ref Range: 1.6 - 6.1 THOU/uL 25.8 (H) 13.9 (H) 22.8 (H)   Neutrophil #-Instrument Latest Units: THOU/uL 23.8 12.2 19.9   Lymph # K/uL Latest Ref Range: 1.2 - 3.7 THOU/uL 3.8 (H) 1.7 2.2   Mono # K/uL Latest Ref Range: 0.2 - 0.9 THOU/uL 0.0 (L) 0.2 0.8   Eos # K/uL Latest Ref Range: 0.0 - 0.4 THOU/uL 0.3 0.0 0.3   Baso # K/uL Latest Ref Range: 0.0 - 0.1 THOU/uL 0.3 (H) 0.3 (H) 0.0   Nucl RBC # K/uL Latest Ref Range: 0.0 - 0.0 THOU/uL 0.2 (H) 0.0 0.1 (H)   Seg Neut % Latest Units: % 57.6 65.6 67.0    Lymphocyte % Latest Units: % 7.8 9.2 6.8   Monocyte % Latest Units: % 0.0 0.8 2.5   Eosinophil % Latest Units: % 0.9 0.0 0.8   Basophil % Latest Units: % 0.9 1.7 0.0   Nucl RBC % Latest Ref Range: 0.0 - 0.2 /100 WBC 0.5 (H) 0.2 0.2   React Lymph % Latest Ref Range: 0 - 6 % 3     Bands % Latest Ref Range: 0 - 10 % 16 (HH) 8 7   Metamyelocyte % Latest Ref Range: 0 - 1 % 3 (H) 6 (H) 3 (H)   Myelocyte % Latest Ref Range: 0 - 0 % 11 (H) 9 (H) 12 (H)   Promyelocyte % Latest Ref Range: 0 - 0 %   2 (H)   Misc. Cell % Latest Ref Range: 0 - 0 % See Text     Toxic Granulation Unknown  Present     Diff Based On Latest Units: CELLS 116 119 118   RBC Morphology Unknown RESULTS  RESULTS   Hypersegmented Neutrophils Unknown Present  Present   Hematopathology Review Unknown See Text     Results for Carly Higgins, Carly Higgins (MRN [id number]) as of 06/15/2014 16:27   Ref. Range 06/09/2014 07:07   Sodium Latest Ref Range: 133 - 145 mmol/L 143   Potassium Latest Ref Range: 3.3 - 5.1 mmol/L 4.6   Chloride Latest Ref Range: 96 - 108 mmol/L 105   CO2 Latest Ref Range: 20 - 28 mmol/L 24   Anion Gap Latest Ref Range: 7 - 16  14   UN Latest Ref Range: 6 - 20 mg/dL 21 (H)   Creatinine Latest Ref Range: 0.51 - 0.95 mg/dL 0.79   GFR,Black Latest Units: * 98   GFR,Caucasian Latest Units: * 85   Glucose Latest Ref Range: 60 - 99 mg/dL 114 (H)   Calcium Latest Ref Range: 8.6 - 10.2 mg/dL 8.9   Total Protein Latest Ref Range: 6.3 - 7.7 g/dL 7.1   Albumin Latest Ref Range: 3.5 - 5.2 g/dL 4.6   ALT Latest Ref Range: 0 - 35 U/L 23   AST Latest Ref Range: 0 - 35 U/L 27   Alk Phos Latest Ref Range: 35 - 105 U/L 56   Bilirubin,Total Latest Ref Range: 0.0 - 1.2 mg/dL 0.4     Physical Exam   Constitutional: She is oriented to person, place, and time. She appears well-developed and well-nourished.   HENT:   Mouth/Throat: Oropharynx is clear and moist. No oropharyngeal exudate.   Eyes: No scleral icterus.   Neck: Neck supple.   Cardiovascular: Normal rate and regular rhythm.     Pulmonary/Chest: Effort normal and breath sounds normal.   Abdominal: Soft. Bowel sounds are normal.   Neurological: She is alert and oriented to person, place, and time.   Skin: Skin is warm and dry. No rash noted.     Assessment:   Carly Higgins is a 54 year old female with unclassified myeloproliferative disorder.  She is tolerating dose adjusted Hydroxyurea.  Degree of anemia and thrombocytopenia are stable.    Plan:     1.  Continue dose adjusted Hydroxyurea.  Current dose is 1,500 mg alternating with 1,000 mg.  2.  Will continue to check CBC every 2 weeks.  3.  Follow up in 3 months.      Jasmine Awe RN,MS  Nurse Practitioner

## 2014-06-22 ENCOUNTER — Ambulatory Visit: Payer: Self-pay | Admitting: Rehabilitative and Restorative Service Providers"

## 2014-06-22 ENCOUNTER — Telehealth: Payer: Self-pay | Admitting: Rehabilitative and Restorative Service Providers"

## 2014-06-22 ENCOUNTER — Encounter: Payer: Self-pay | Admitting: Gastroenterology

## 2014-06-22 DIAGNOSIS — S39012D Strain of muscle, fascia and tendon of lower back, subsequent encounter: Secondary | ICD-10-CM

## 2014-06-22 NOTE — Telephone Encounter (Signed)
Carly Higgins, calling from the patients primary care's office, would like the last office notes faxed over to [phone]. The patient was advised to try a new medication and they need this in writing. If you have any questions please call her back at [phone].

## 2014-06-22 NOTE — Progress Notes (Signed)
06/22/14 0800   Overview   Diagnosis Low back, buttock pain   Insurance [employer]  [phone])   Script Date 06/14/14   Missed Visit Canceled   *Rescheduled to different time        Gerarda Gunther, PT, DPT

## 2014-06-22 NOTE — Progress Notes (Signed)
Physical Therapy Exercise Flowsheet:  *Please refer to Physical Therapy Daily Flowsheet for further details of this session.*     06/22/14 1100   Lumbar/Abdominal Exercises   Bridges w/ Pillow Squeeze Comment 1 set,10x, tol well   Pelvic Tilt w/ Marching, Supine Comment 1 set,10x, tol well   Single Knee To Chest w/ Straight Leg Raise Comment 1 set,10x, tol well   Hip Exercises   Piriformis Stretch, Supine Comment 1 set,10x, tol well         Lorenza Chick PT, DPT

## 2014-06-22 NOTE — Progress Notes (Signed)
Physical Therapy Daily Flowsheet:  *Please see Physical Therapy Exercise Flowsheet for details regarding exercises completed this session.*     06/22/14 1100   Overview   Diagnosis Low back, buttock pain   Insurance [employer]   Script Date 06/22/14   Visit # 1   Pain Assessment   0-10 Scale 5;6   Pain Location Back   Modalities   Time LS 10 minutes   Patient Education   Educated in disease process Yes   Educated in home exercise program Yes   Time Calculation   PT Timed Codes 8   PT Untimed Codes 25   PT Total Treatment 33   Charges   Visit Charges Outpatient Pt Eval - code 3302 (30 minutes);Therapeutic Exercises - code [id number] (15 minutes)       Lorenza Chick PT, DPT

## 2014-06-22 NOTE — Progress Notes (Deleted)
Department of Physical Medicine & Rehabilitation  Physical Therapy Initial Assessment    History/Subjective    Diagnosis:  Low back, right buttock pain    Referring practitioner: Erin Sons, MD    Onset date of symptoms:  ***    Mechanism of injury: ***    Work Status:  ***    Sports/Activities/Functional limitations:  ***    Symptoms Worsen With: ***    Symptoms Better With: ***      Pain:   Today: ***/10   Best: ***/10   Worst: ***/10      Location: ***    Objective    Observation: ***    Palpation: ***    Reflexes: ***    Gait: ***    ROM Lumbar Spine %   Flexion ***   Extension ***   Right Sidebending ***   Left Sidebending ***   Right Rotation ***   Left Rotation ***       Strength:    Left LE: ***  Right LE: ***           SPECIAL TEST Lumbar Spine + / -   SLR ***   SLR with DF ***   Compression ***   Distraction ***   90-90 HS ***   *** ***   *** ***            Assessment:  ***    Rehab potential/prognosis: {GOOD/FAIR/POOR:24734}  Patient's understanding: {GOOD/FAIR/POOR:24734}    Patient's Goal:  ***    Plan  Plan of Care: {PT Plan of Care:26511}    PT interventions: {Pt Interventions:26527}    PT frequency:  {Weekly/Monthly:26529}    PT duration: {Visit/Weeks:26510}      Short term goals (*** weeks):  1. Initiate HEP  2. Independent with home thermal agents  3. ***  4. ***    Long term goals (*** weeks):  1. Independent with final HEP  2. ***  3. ***  4. ***      Thank you for the referral.  If you have any questions and/or concerns, please feel free to contact me at [phone].      Gerarda Gunther, PT, DPT

## 2014-06-22 NOTE — Progress Notes (Signed)
Department of Physical Medicine & Rehabilitation   Physical Therapy Initial Assessment     History     Diagnosis: back pain     Referring practitioner: Cahnhidalgo    Next appointment: PRN     Onset date on symptoms/Date of Surgery: 03/09/2014    Previous Treatments: none    Previous Functional Level: Independent     Home Living: Independent     Work Status: [employer]     Sports/Activities: none     Subjective     Pain:     0-10 Scale: 5   Pain Location: Back     Relevant symptoms: pain in LS / Right SI    Mechanism of injury/history of symptoms: progressive onset    Symptoms worsen with/ difficulty with the following:: Static standing;Walking    Symptoms improve with: Rest, Ice, Heat, Activity, Medication     Able to do the following:: Static sitting    Objective     Observation: Posture-poor with a forward and rounded shoulders. Gait- Normal.     Palpation: Tender to palpation at LS / Right PSIS    Sensation: No deficits noted     Coordination: Good     ROM:     ROM     Lumbar Spine: Impaired AROM Lumbar Spine   Extension: 100% decrease in motion   Right Lower Extremity: Full AROM RLE   Left Lower Extremity: Full AROM LLE     Strength:     STRENGTH     Right Lower Extremity: RLE Strength WFLs & able to perform ADL Tasks   Left Lower Extremity: LLE Strength WFLs & able to perform ADL tasks     Reflexes: not elicited     Special test:     Special Tests   Corky Sox: Positive  L3: Negative   Quadriceps: Negative   SLR: Positive  Thomas: Negative     Balance: Good     Functional assessment:     Functional Activities   Deviations: within normal limits       Endurance: good     Assessment     LS / Right SI Strain R/O HNP LS RLE Radiculopathy    Rehab potential/prognosis: good     Patient's understanding: good     Plan     Plan of care: Appropriate for PT     PT interventions: AROM/PROM/Therapeutic exercise, Cold, Heat, Home exercise program instruction, Patient/Family Education, Postural training/body Dealer education,  Strengthening     PT frequency: Once a week, Twice a week     PT duration: 4 weeks     Short-term goals ( two weeks)     1. Demonstrate an exercise program   2. Patient education about postural mechanics   3. Patient to sit erect 1 min.     Long-term goals ( four weeks)     1. Independent with home exercise program   2. Independent patient education about postural mechanics   3. Patient to stand 30 min. without difficulty   4. Patient ambulate 30 min. without difficulty     Patient Goals for Therapy: Home exercise program     Thank you for the referral. If you have any questions and/or concerns, please feel free to contact me at [phone].     Lorenza Chick PT, DPT

## 2014-06-22 NOTE — Progress Notes (Deleted)
Department of Physical Medicine & Rehabilitation  Physical Therapy Initial Assessment    History/Subjective    Diagnosis: Low back, right buttock pain    Referring practitioner: Erin Sons, MD    Onset date of symptoms: ***    Mechanism of injury: ***    Work Status: ***    Sports/Activities/Functional limitations: ***    Symptoms Worsen With: ***    Symptoms Better With: ***      Pain: Today: ***/10  Best: ***/10  Worst: ***/10   Location: ***    Objective    Observation: ***    Palpation: ***    Reflexes: ***    Gait: ***    ROM Lumbar Spine %   Flexion ***   Extension ***   Right Sidebending ***   Left Sidebending ***   Right Rotation ***   Left Rotation ***       Strength:    Left LE: ***  Right LE: ***          SPECIAL TEST Lumbar Spine + / -   SLR ***   SLR with DF ***   Compression ***   Distraction ***   90-90 HS ***   *** ***   *** ***           Assessment:  ***    Rehab potential/prognosis: {GOOD/FAIR/POOR:24734}  Patient's understanding: {GOOD/FAIR/POOR:24734}   Patient's Goal: ***    Plan  Plan of Care: {PT Plan of Care:26511}    PT interventions: {Pt Interventions:26527}    PT frequency:  {Weekly/Monthly:26529}    PT duration: {Visit/Weeks:26510}     Short term goals (*** weeks):  1. Initiate HEP  2. Independent with home thermal agents  3. ***  4. ***    Long term goals (*** weeks):  1. Independent with final HEP  2. ***  3. ***  4. ***      Thank you for the referral. If you have any questions and/or concerns, please feel free to contact me at [phone].      Gerarda Gunther, PT, DPT

## 2014-06-22 NOTE — Patient Instructions (Signed)
Patient was instructed to perform their home exercise program two to three times per day to tolerance. Each exercise should be performed  10 times each or to tolerance. You should utilize moist heat at home for approximately 15 min. if you have pain. If you have any questions please feel free to contact me at [phone] between the hours of 12 and 1 PM. Please arrive to all physical therapy appointments approximately 15 min. early. Thank you Joe Shatasia Cutshaw PT

## 2014-06-22 NOTE — Telephone Encounter (Signed)
PCP office contacted clinic and writer returned call. Spoke to [facility] about patient stating the medication the PCP prescribed isn't working. Recommended patient contact PCP office. Roseanne to speak to NP since PCP out of the office until next week. Informed patient was just seen today for PT. ? New medication to be prescribed.    Lorenza Chick PT, DPT

## 2014-06-23 ENCOUNTER — Ambulatory Visit
Admit: 2014-06-23 | Discharge: 2014-06-23 | Disposition: A | Discharge status: Home / Self Care | Payer: Self-pay | Source: Ambulatory Visit | Attending: Obstetrics and Gynecology | Admitting: Obstetrics and Gynecology

## 2014-06-24 ENCOUNTER — Other Ambulatory Visit: Payer: Self-pay | Admitting: Internal Medicine

## 2014-06-24 DIAGNOSIS — R6889 Other general symptoms and signs: Secondary | ICD-10-CM

## 2014-06-28 ENCOUNTER — Telehealth: Payer: Self-pay | Admitting: Hematology

## 2014-06-28 ENCOUNTER — Telehealth: Payer: Self-pay

## 2014-06-28 ENCOUNTER — Ambulatory Visit
Admit: 2014-06-28 | Discharge: 2014-06-28 | Disposition: A | Discharge status: Home / Self Care | Payer: Self-pay | Source: Ambulatory Visit | Attending: Hematology | Admitting: Hematology

## 2014-06-28 DIAGNOSIS — D72829 Elevated white blood cell count, unspecified: Secondary | ICD-10-CM

## 2014-06-28 LAB — CBC AND DIFFERENTIAL
Baso # K/uL: 0 10*3/uL (ref 0.0–0.1)
Basophil %: 0 %
Eos # K/uL: 0 10*3/uL (ref 0.0–0.4)
Eosinophil %: 0 %
Hematocrit: 36 % (ref 34–45)
Hemoglobin: 11.6 g/dL (ref 11.2–15.7)
Lymph # K/uL: 3.2 10*3/uL (ref 1.2–3.7)
Lymphocyte %: 8.5 %
MCH: 34 pg/cell — ABNORMAL HIGH (ref 26–32)
MCHC: 32 g/dL (ref 32–36)
MCV: 107 fL — ABNORMAL HIGH (ref 79–95)
Mono # K/uL: 0.9 10*3/uL (ref 0.2–0.9)
Monocyte %: 2.6 %
Neut # K/uL: 24.1 10*3/uL — ABNORMAL HIGH (ref 1.6–6.1)
Nucl RBC # K/uL: 0.1 10*3/uL — ABNORMAL HIGH (ref 0.0–0.0)
Nucl RBC %: 0.2 /100 WBC (ref 0.0–0.2)
Platelets: 94 10*3/uL — ABNORMAL LOW (ref 160–370)
RBC: 3.4 MIL/uL — ABNORMAL LOW (ref 3.9–5.2)
RDW: 16.3 % — ABNORMAL HIGH (ref 11.7–14.4)
Seg Neut %: 59 %
WBC: 35.9 10*3/uL — ABNORMAL HIGH (ref 4.0–10.0)

## 2014-06-28 LAB — COMPREHENSIVE METABOLIC PANEL, PL
ALT, PL: 21 U/L (ref 0–35)
AST, PL: 28 U/L (ref 0–35)
Albumin, PL: 4.5 g/dL (ref 3.5–5.2)
Alk Phos, PL: 61 U/L (ref 35–105)
Anion Gap,PL: 15 (ref 7–16)
Bilirubin Total, PL: 0.4 mg/dL (ref 0.0–1.2)
CO2,Plasma: 24 mmol/L (ref 20–28)
Calcium, PL: 9.2 mg/dL (ref 8.6–10.2)
Chloride,Plasma: 103 mmol/L (ref 96–108)
Creatinine: 0.85 mg/dL (ref 0.51–0.95)
GFR,Black: 90 *
GFR,Caucasian: 78 *
Glucose,Plasma: 109 mg/dL — ABNORMAL HIGH (ref 60–99)
Potassium,Plasma: 4.9 mmol/L — ABNORMAL HIGH (ref 3.4–4.7)
Sodium,Plasma: 142 mmol/L (ref 133–145)
Total Protein, PL: 7.1 g/dL (ref 6.3–7.7)
UN,Plasma: 24 mg/dL — ABNORMAL HIGH (ref 6–20)

## 2014-06-28 LAB — GYN CYTOLOGY

## 2014-06-28 LAB — DIFF MANUAL
Bands %: 8 % (ref 0–10)
Diff Based On: 117 CELLS
Metamyelocyte %: 6 % — ABNORMAL HIGH (ref 0–1)
Myelocyte %: 16 % — ABNORMAL HIGH (ref 0–0)

## 2014-06-28 LAB — NEUTROPHIL #-INSTRUMENT: Neutrophil #-Instrument: 23.8 10*3/uL

## 2014-06-28 NOTE — Telephone Encounter (Signed)
LM re: wbc 35.9, hct 36, plt 94, anc 24.1 - decrease HU to 1000mg  daily

## 2014-06-28 NOTE — Telephone Encounter (Signed)
Left another message for pt re: wbc 35.9, hct 36, plt 94, anc 24.1 - decrease 1000mg  daily.  Asked for call back for any questions or concerns.

## 2014-06-29 ENCOUNTER — Ambulatory Visit
Admit: 2014-06-29 | Discharge: 2014-06-29 | Disposition: A | Discharge status: Home / Self Care | Payer: Self-pay | Source: Ambulatory Visit | Attending: Radiology | Admitting: Radiology

## 2014-06-29 ENCOUNTER — Telehealth: Payer: Self-pay | Admitting: Hematology

## 2014-06-29 NOTE — Telephone Encounter (Signed)
Returned patient's call.  Explained that Dr.Liesveld wants to decrease HU 1000 mg q.day because platelets have dropped.  She will re-check her labs weekly.

## 2014-06-30 ENCOUNTER — Ambulatory Visit: Payer: Self-pay | Admitting: Rehabilitative and Restorative Service Providers"

## 2014-06-30 DIAGNOSIS — S39012D Strain of muscle, fascia and tendon of lower back, subsequent encounter: Secondary | ICD-10-CM

## 2014-06-30 NOTE — Telephone Encounter (Signed)
Left message

## 2014-07-01 NOTE — Progress Notes (Signed)
Physical Therapy Daily Flowsheet:  *Please see Physical Therapy Exercise Flowsheet for details regarding exercises completed this session.*     06/30/14 0700   Overview   Diagnosis Low back, buttock pain   Insurance [employer]   Script Date 06/22/14   Visit # 2   Pain Assessment   0-10 Scale 3;4   Pain Location Back   Modalities   Time LS 10 minutes   Patient Education   Educated in disease process Yes   Educated in home exercise program Yes   Time Calculation   PT Timed Codes 25   PT Untimed Codes 10   PT Total Treatment 35   Charges   Visit Charges Therapeutic Exercises - code [phone] (15 minutes x2);Hot/cold packs - code 7096       Lorenza Chick PT, DPT

## 2014-07-01 NOTE — Progress Notes (Signed)
Physical Therapy Exercise Flowsheet:  *Please refer to Physical Therapy Daily Flowsheet for further details of this session.*     06/30/14 1500   Lumbar/Abdominal Exercises   Bridges w/ Pillow Squeeze Comment 1 set,10x, tol well   Pelvic Tilt w/ Marching, Supine Comment 1 set,10x, tol well   Single Knee To Chest w/ Straight Leg Raise Comment 1 set,10x, tol well   Hip Exercises   Piriformis Stretch, Supine Comment 1 set,10x, tol well       Lorenza Chick PT, DPT

## 2014-07-05 ENCOUNTER — Ambulatory Visit: Payer: Self-pay | Admitting: Rehabilitative and Restorative Service Providers"

## 2014-07-05 ENCOUNTER — Telehealth: Payer: Self-pay | Admitting: Rehabilitative and Restorative Service Providers"

## 2014-07-05 NOTE — Progress Notes (Signed)
07/05/14 0700   Overview   Diagnosis Low back, buttock pain   Insurance [employer]   Script Date 06/22/14   Missed Visit Canceled       Lorenza Chick PT, DPT

## 2014-07-05 NOTE — Telephone Encounter (Signed)
Ms. Lohmeyer is calling to cancel her appointment which is currently scheduled for today, 6/27 with Lorenza Chick.    Has the appointment been cancelled? yes    Has the appointment been rescheduled? yes    Does the patient need a call back to reschedule?no

## 2014-07-13 ENCOUNTER — Other Ambulatory Visit: Payer: Self-pay | Admitting: Hematology and Oncology

## 2014-07-14 ENCOUNTER — Encounter: Payer: Self-pay | Admitting: Diagnostic Radiology

## 2014-07-14 ENCOUNTER — Ambulatory Visit
Admit: 2014-07-14 | Discharge: 2014-07-14 | Disposition: A | Discharge status: Home / Self Care | Payer: Self-pay | Source: Ambulatory Visit | Attending: Internal Medicine | Admitting: Internal Medicine

## 2014-07-16 ENCOUNTER — Other Ambulatory Visit: Payer: Self-pay | Admitting: Hematology

## 2014-07-16 MED ORDER — HYDROXYUREA 500 MG PO CAPS *I*
1500.0000 mg | ORAL_CAPSULE | Freq: Every day | ORAL | 2 refills | Status: DC
Start: 2014-07-16 — End: 2014-10-22

## 2014-07-19 ENCOUNTER — Telehealth: Payer: Self-pay | Admitting: Rehabilitative and Restorative Service Providers"

## 2014-07-19 ENCOUNTER — Ambulatory Visit: Payer: Self-pay | Admitting: Rehabilitative and Restorative Service Providers"

## 2014-07-19 ENCOUNTER — Other Ambulatory Visit: Payer: Self-pay | Admitting: Oncology

## 2014-07-19 NOTE — Progress Notes (Signed)
07/19/14 0700   Overview   Diagnosis Low back, buttock pain   Insurance [employer]   Script Date 06/22/14   Missed Visit Canceled       Lorenza Chick PT, DPT

## 2014-07-19 NOTE — Telephone Encounter (Signed)
Ms. Pe is calling to cancel her appointment which is currently scheduled for today, 7/11 with Lorenza Chick.    Has the appointment been cancelled? yes    Has the appointment been rescheduled? yes    Does the patient need a call back to reschedule?no  '

## 2014-07-26 ENCOUNTER — Telehealth: Payer: Self-pay | Admitting: Rehabilitative and Restorative Service Providers"

## 2014-07-26 ENCOUNTER — Ambulatory Visit: Payer: Self-pay | Admitting: Rehabilitative and Restorative Service Providers"

## 2014-07-26 NOTE — Progress Notes (Signed)
07/26/14 0700   Overview   Diagnosis Low back, buttock pain   Insurance [employer]   Script Date 07/26/14   Missed Visit Canceled       Lorenza Chick PT, DPT

## 2014-07-26 NOTE — Telephone Encounter (Signed)
Ms. Ausherman is calling to cancel her appointment which is currently scheduled for 330 she cant make it.    Has the appointment been cancelled? yes    Has the appointment been rescheduled? no    Does the patient need a call back to reschedule?no

## 2014-07-27 ENCOUNTER — Telehealth: Payer: Self-pay | Admitting: Hematology

## 2014-07-27 ENCOUNTER — Ambulatory Visit
Admit: 2014-07-27 | Discharge: 2014-07-27 | Disposition: A | Discharge status: Home / Self Care | Payer: Self-pay | Source: Ambulatory Visit | Attending: Hematology | Admitting: Hematology

## 2014-07-27 ENCOUNTER — Encounter: Payer: Self-pay | Admitting: Oncology

## 2014-07-27 ENCOUNTER — Other Ambulatory Visit: Payer: Self-pay | Admitting: Oncology

## 2014-07-27 DIAGNOSIS — D72829 Elevated white blood cell count, unspecified: Secondary | ICD-10-CM

## 2014-07-27 LAB — CBC AND DIFFERENTIAL
Baso # K/uL: 0.7 10*3/uL — ABNORMAL HIGH (ref 0.0–0.1)
Basophil %: 0.8 %
Eos # K/uL: 0 10*3/uL (ref 0.0–0.4)
Eosinophil %: 0 %
Hematocrit: 33 % — ABNORMAL LOW (ref 34–45)
Hemoglobin: 10.9 g/dL — ABNORMAL LOW (ref 11.2–15.7)
Lymph # K/uL: 5.7 10*3/uL — ABNORMAL HIGH (ref 1.2–3.7)
Lymphocyte %: 6.8 %
MCH: 35 pg/cell — ABNORMAL HIGH (ref 26–32)
MCHC: 33 g/dL (ref 32–36)
MCV: 108 fL — ABNORMAL HIGH (ref 79–95)
Mono # K/uL: 0.7 10*3/uL (ref 0.2–0.9)
Monocyte %: 0.8 %
Neut # K/uL: 55.7 10*3/uL — ABNORMAL HIGH (ref 1.6–6.1)
Nucl RBC # K/uL: 0.5 10*3/uL — ABNORMAL HIGH (ref 0.0–0.0)
Nucl RBC %: 0.6 /100 WBC — ABNORMAL HIGH (ref 0.0–0.2)
Platelets: 81 10*3/uL — ABNORMAL LOW (ref 160–370)
RBC: 3.1 MIL/uL — ABNORMAL LOW (ref 3.9–5.2)
RDW: 16.3 % — ABNORMAL HIGH (ref 11.7–14.4)
Seg Neut %: 60.3 %
WBC: 80.7 10*3/uL — ABNORMAL HIGH (ref 4.0–10.0)

## 2014-07-27 LAB — COMPREHENSIVE METABOLIC PANEL, PL
ALT, PL: 18 U/L (ref 0–35)
AST, PL: 45 U/L — ABNORMAL HIGH (ref 0–35)
Albumin, PL: 4.4 g/dL (ref 3.5–5.2)
Alk Phos, PL: 66 U/L (ref 35–105)
Anion Gap,PL: 19 — ABNORMAL HIGH (ref 7–16)
Bilirubin Total, PL: 0.4 mg/dL (ref 0.0–1.2)
CO2,Plasma: 22 mmol/L (ref 20–28)
Calcium, PL: 9.7 mg/dL (ref 8.6–10.2)
Chloride,Plasma: 99 mmol/L (ref 96–108)
Creatinine: 0.92 mg/dL (ref 0.51–0.95)
GFR,Black: 82 *
GFR,Caucasian: 71 *
Glucose,Plasma: 111 mg/dL — ABNORMAL HIGH (ref 60–99)
Potassium,Plasma: 5.4 mmol/L — ABNORMAL HIGH (ref 3.4–4.7)
Sodium,Plasma: 140 mmol/L (ref 133–145)
Total Protein, PL: 7.1 g/dL (ref 6.3–7.7)
UN,Plasma: 20 mg/dL (ref 6–20)

## 2014-07-27 LAB — NEUTROPHIL #-INSTRUMENT: Neutrophil #-Instrument: 51.9 10*3/uL

## 2014-07-27 NOTE — Telephone Encounter (Signed)
Called patient but no answer.  Asked that she call us back.

## 2014-07-27 NOTE — Progress Notes (Signed)
WBC elevated to 80,000  Platelet count 81,000     Instructed patient to increase Hydroxyurea to 1500 mg alternating with 1000 mg.  She did not want to do this.  Her preference is to increase the Hydroxyurea to 1500 mg Mon - Fri and 1000 mg on Sat and Sun.    Patient will recheck CBC in 1 week.

## 2014-07-28 LAB — DIFF MANUAL
Bands %: 9 % (ref 0–10)
Blasts %: 1 % — ABNORMAL HIGH (ref 0–0)
Diff Based On: 118 CELLS
Metamyelocyte %: 10 % — ABNORMAL HIGH (ref 0–1)
Myelocyte %: 9 % — ABNORMAL HIGH (ref 0–0)
Promyelocyte %: 3 % — ABNORMAL HIGH (ref 0–0)

## 2014-07-28 LAB — HEMATOPATHOLOGY REVIEW

## 2014-08-03 ENCOUNTER — Telehealth: Payer: Self-pay

## 2014-08-03 ENCOUNTER — Ambulatory Visit
Admit: 2014-08-03 | Discharge: 2014-08-03 | Disposition: A | Discharge status: Home / Self Care | Payer: Self-pay | Source: Ambulatory Visit | Attending: Hematology | Admitting: Hematology

## 2014-08-03 DIAGNOSIS — D72829 Elevated white blood cell count, unspecified: Secondary | ICD-10-CM

## 2014-08-03 LAB — CBC AND DIFFERENTIAL
Baso # K/uL: 0.5 10*3/uL — ABNORMAL HIGH (ref 0.0–0.1)
Basophil %: 0.8 %
Eos # K/uL: 0 10*3/uL (ref 0.0–0.4)
Eosinophil %: 0 %
Hematocrit: 34 % (ref 34–45)
Hemoglobin: 10.9 g/dL — ABNORMAL LOW (ref 11.2–15.7)
Lymph # K/uL: 5.6 10*3/uL — ABNORMAL HIGH (ref 1.2–3.7)
Lymphocyte %: 9.2 %
MCH: 35 pg/cell — ABNORMAL HIGH (ref 26–32)
MCHC: 32 g/dL (ref 32–36)
MCV: 108 fL — ABNORMAL HIGH (ref 79–95)
Mono # K/uL: 1.6 10*3/uL — ABNORMAL HIGH (ref 0.2–0.9)
Monocyte %: 2.5 %
Neut # K/uL: 44.9 10*3/uL — ABNORMAL HIGH (ref 1.6–6.1)
Nucl RBC # K/uL: 0.3 10*3/uL — ABNORMAL HIGH (ref 0.0–0.0)
Nucl RBC %: 0.5 /100 WBC — ABNORMAL HIGH (ref 0.0–0.2)
Platelets: 77 10*3/uL — ABNORMAL LOW (ref 160–370)
RBC: 3.1 MIL/uL — ABNORMAL LOW (ref 3.9–5.2)
RDW: 16.3 % — ABNORMAL HIGH (ref 11.7–14.4)
Seg Neut %: 61.7 %
WBC: 62.3 10*3/uL — ABNORMAL HIGH (ref 4.0–10.0)

## 2014-08-03 LAB — COMPREHENSIVE METABOLIC PANEL, PL
ALT, PL: 22 U/L (ref 0–35)
AST, PL: 29 U/L (ref 0–35)
Albumin, PL: 4.5 g/dL (ref 3.5–5.2)
Alk Phos, PL: 70 U/L (ref 35–105)
Anion Gap,PL: 12 (ref 7–16)
Bilirubin Total, PL: 0.4 mg/dL (ref 0.0–1.2)
CO2,Plasma: 27 mmol/L (ref 20–28)
Calcium, PL: 9.5 mg/dL (ref 8.6–10.2)
Chloride,Plasma: 100 mmol/L (ref 96–108)
Creatinine: 0.86 mg/dL (ref 0.51–0.95)
GFR,Black: 88 *
GFR,Caucasian: 77 *
Glucose,Plasma: 132 mg/dL — ABNORMAL HIGH (ref 60–99)
Potassium,Plasma: 4.7 mmol/L (ref 3.4–4.7)
Sodium,Plasma: 139 mmol/L (ref 133–145)
Total Protein, PL: 7.6 g/dL (ref 6.3–7.7)
UN,Plasma: 19 mg/dL (ref 6–20)

## 2014-08-03 LAB — NEUTROPHIL #-INSTRUMENT: Neutrophil #-Instrument: 40.7 10*3/uL

## 2014-08-03 LAB — DIFF MANUAL
Bands %: 10 % (ref 0–10)
Blasts %: 1 % — ABNORMAL HIGH (ref 0–0)
Diff Based On: 120 CELLS
Metamyelocyte %: 5 % — ABNORMAL HIGH (ref 0–1)
Myelocyte %: 10 % — ABNORMAL HIGH (ref 0–0)

## 2014-08-03 NOTE — Telephone Encounter (Signed)
LM: wbc 62 (down from 80), hct 34, plt 77   Instructed to continue Hydrea - 1500 mg Mon - Fri and 1000 mg on Sat and Sun.  Encouraged to call back for any questions.

## 2014-08-18 ENCOUNTER — Ambulatory Visit
Admit: 2014-08-18 | Discharge: 2014-08-18 | Disposition: A | Discharge status: Home / Self Care | Payer: Self-pay | Source: Ambulatory Visit | Attending: Hematology | Admitting: Hematology

## 2014-08-18 ENCOUNTER — Telehealth: Payer: Self-pay

## 2014-08-18 ENCOUNTER — Telehealth: Payer: Self-pay | Admitting: Hematology

## 2014-08-18 DIAGNOSIS — D72829 Elevated white blood cell count, unspecified: Secondary | ICD-10-CM

## 2014-08-18 LAB — CBC AND DIFFERENTIAL
Baso # K/uL: 0.2 10*3/uL — ABNORMAL HIGH (ref 0.0–0.1)
Basophil %: 0.5 %
Eos # K/uL: 0 10*3/uL (ref 0.0–0.4)
Eosinophil %: 0 %
Hematocrit: 32 % — ABNORMAL LOW (ref 34–45)
Hemoglobin: 10 g/dL — ABNORMAL LOW (ref 11.2–15.7)
Lymph # K/uL: 1.8 10*3/uL (ref 1.2–3.7)
Lymphocyte %: 4 %
MCH: 35 pg/cell — ABNORMAL HIGH (ref 26–32)
MCHC: 32 g/dL (ref 32–36)
MCV: 111 fL — ABNORMAL HIGH (ref 79–95)
Mono # K/uL: 1.1 10*3/uL — ABNORMAL HIGH (ref 0.2–0.9)
Monocyte %: 2.5 %
Neut # K/uL: 33.1 10*3/uL — ABNORMAL HIGH (ref 1.6–6.1)
Nucl RBC # K/uL: 0.2 10*3/uL — ABNORMAL HIGH (ref 0.0–0.0)
Nucl RBC %: 0.4 /100 WBC — ABNORMAL HIGH (ref 0.0–0.2)
Platelets: 81 10*3/uL — ABNORMAL LOW (ref 160–370)
RBC: 2.9 MIL/uL — ABNORMAL LOW (ref 3.9–5.2)
RDW: 16.6 % — ABNORMAL HIGH (ref 11.7–14.4)
Seg Neut %: 62 %
WBC: 45.4 10*3/uL — ABNORMAL HIGH (ref 4.0–10.0)

## 2014-08-18 LAB — COMPREHENSIVE METABOLIC PANEL, PL
ALT, PL: 19 U/L (ref 0–35)
AST, PL: 30 U/L (ref 0–35)
Albumin, PL: 4.4 g/dL (ref 3.5–5.2)
Alk Phos, PL: 64 U/L (ref 35–105)
Anion Gap,PL: 14 (ref 7–16)
Bilirubin Total, PL: 0.3 mg/dL (ref 0.0–1.2)
CO2,Plasma: 23 mmol/L (ref 20–28)
Calcium, PL: 9.3 mg/dL (ref 8.6–10.2)
Chloride,Plasma: 104 mmol/L (ref 96–108)
Creatinine: 0.84 mg/dL (ref 0.51–0.95)
GFR,Black: 91 *
GFR,Caucasian: 79 *
Glucose,Plasma: 113 mg/dL — ABNORMAL HIGH (ref 60–99)
Potassium,Plasma: 4.4 mmol/L (ref 3.4–4.7)
Sodium,Plasma: 141 mmol/L (ref 133–145)
Total Protein, PL: 7.2 g/dL (ref 6.3–7.7)
UN,Plasma: 20 mg/dL (ref 6–20)

## 2014-08-18 LAB — DIFF MANUAL
Bands %: 11 % (ref 0–10)
Blasts %: 1 % — ABNORMAL HIGH (ref 0–0)
Diff Based On: 200 CELLS
Metamyelocyte %: 8 % — ABNORMAL HIGH (ref 0–1)
Myelocyte %: 11 % — ABNORMAL HIGH (ref 0–0)
Promyelocyte %: 1 % — ABNORMAL HIGH (ref 0–0)

## 2014-08-18 LAB — HEMATOPATHOLOGY REVIEW

## 2014-08-18 LAB — NEUTROPHIL #-INSTRUMENT: Neutrophil #-Instrument: 28.1 10*3/uL

## 2014-08-18 NOTE — Telephone Encounter (Signed)
Will leave as is. Opal Sidles

## 2014-08-18 NOTE — Telephone Encounter (Signed)
Reviewed labs with patient.  wbc 45.4, hct 32, plt 81K  She complains of fatigue, but otherwise feels OK.  Currently on HU 1500 mg Mon - Fri and 1000 mg on Sat and Sun.  Repeat labs in 2 weeks.  Good understanding.

## 2014-08-18 NOTE — Telephone Encounter (Addendum)
Critical lab value called in:     Name of provider:  Liesveld/Likly/Shanto     Time provider notified: 0815     Time provider responded:       Critical lab and value:  Bands 11     Action taken as result of notification: Team notified and will follow through

## 2014-08-31 ENCOUNTER — Ambulatory Visit
Admission: RE | Admit: 2014-08-31 | Discharge: 2014-08-31 | Disposition: A | Discharge status: Home / Self Care | Payer: Self-pay | Source: Ambulatory Visit | Attending: Hematology | Admitting: Hematology

## 2014-08-31 ENCOUNTER — Telehealth: Payer: Self-pay

## 2014-08-31 DIAGNOSIS — D72829 Elevated white blood cell count, unspecified: Secondary | ICD-10-CM

## 2014-08-31 LAB — CBC AND DIFFERENTIAL
Baso # K/uL: 0 10*3/uL (ref 0.0–0.1)
Basophil %: 0 %
Eos # K/uL: 0.2 10*3/uL (ref 0.0–0.4)
Eosinophil %: 0.5 %
Hematocrit: 30 % — ABNORMAL LOW (ref 34–45)
Hemoglobin: 9.5 g/dL — ABNORMAL LOW (ref 11.2–15.7)
Lymph # K/uL: 4.2 10*3/uL — ABNORMAL HIGH (ref 1.2–3.7)
Lymphocyte %: 9.5 %
MCH: 36 pg/cell — ABNORMAL HIGH (ref 26–32)
MCHC: 32 g/dL (ref 32–36)
MCV: 112 fL — ABNORMAL HIGH (ref 79–95)
Mono # K/uL: 0.8 10*3/uL (ref 0.2–0.9)
Monocyte %: 2 %
Neut # K/uL: 28.4 10*3/uL — ABNORMAL HIGH (ref 1.6–6.1)
Nucl RBC # K/uL: 0.2 10*3/uL — ABNORMAL HIGH (ref 0.0–0.0)
Nucl RBC %: 0.5 /100 WBC — ABNORMAL HIGH (ref 0.0–0.2)
Platelets: 69 10*3/uL — ABNORMAL LOW (ref 160–370)
RBC: 2.7 MIL/uL — ABNORMAL LOW (ref 3.9–5.2)
RDW: 17.2 % — ABNORMAL HIGH (ref 11.7–14.4)
Seg Neut %: 53.5 %
WBC: 41.8 10*3/uL — ABNORMAL HIGH (ref 4.0–10.0)

## 2014-08-31 LAB — DIFF MANUAL
Bands %: 14 % (ref 0–10)
Blasts %: 1 % — ABNORMAL HIGH (ref 0–0)
Diff Based On: 200 CELLS
Metamyelocyte %: 7 % — ABNORMAL HIGH (ref 0–1)
Myelocyte %: 12 % — ABNORMAL HIGH (ref 0–0)
Promyelocyte %: 2 % — ABNORMAL HIGH (ref 0–0)

## 2014-08-31 LAB — COMPREHENSIVE METABOLIC PANEL, PL
ALT, PL: 15 U/L (ref 0–35)
AST, PL: 29 U/L (ref 0–35)
Albumin, PL: 4.4 g/dL (ref 3.5–5.2)
Alk Phos, PL: 59 U/L (ref 35–105)
Anion Gap,PL: 13 (ref 7–16)
Bilirubin Total, PL: 0.4 mg/dL (ref 0.0–1.2)
CO2,Plasma: 25 mmol/L (ref 20–28)
Calcium, PL: 9 mg/dL (ref 8.6–10.2)
Chloride,Plasma: 104 mmol/L (ref 96–108)
Creatinine: 0.87 mg/dL (ref 0.51–0.95)
GFR,Black: 87 *
GFR,Caucasian: 76 *
Glucose,Plasma: 111 mg/dL — ABNORMAL HIGH (ref 60–99)
Potassium,Plasma: 4.4 mmol/L (ref 3.4–4.7)
Sodium,Plasma: 142 mmol/L (ref 133–145)
Total Protein, PL: 7 g/dL (ref 6.3–7.7)
UN,Plasma: 13 mg/dL (ref 6–20)

## 2014-08-31 LAB — NEUTROPHIL #-INSTRUMENT: Neutrophil #-Instrument: 26.6 10*3/uL

## 2014-08-31 NOTE — Telephone Encounter (Signed)
Critical lab value called in:     Name of provider:Dr Achille Rich RN     Time provider notified:9:56     Time provider responded:  9:57     Critical lab and value: bands 14     Action taken as result of notification: Dr Lytle Butte  Aware.

## 2014-09-01 ENCOUNTER — Telehealth: Payer: Self-pay | Admitting: Hematology

## 2014-09-01 NOTE — Telephone Encounter (Signed)
wbc 41.8, hct 30, plt 69K  Per Dr.Liesveld - continue HU 1500 mg Mon - Fri and 1000 mg on Sat and Sun.   Follow up with Dr.Liesveld on 9/15 @ 11:15am  Good understanding verbalized.

## 2014-09-01 NOTE — Telephone Encounter (Signed)
See previous note

## 2014-09-14 ENCOUNTER — Ambulatory Visit
Admission: RE | Admit: 2014-09-14 | Discharge: 2014-09-14 | Disposition: A | Discharge status: Home / Self Care | Payer: Self-pay | Source: Ambulatory Visit | Attending: Hematology | Admitting: Hematology

## 2014-09-14 ENCOUNTER — Telehealth: Payer: Self-pay | Admitting: Hematology

## 2014-09-14 DIAGNOSIS — D72829 Elevated white blood cell count, unspecified: Secondary | ICD-10-CM

## 2014-09-14 LAB — CBC AND DIFFERENTIAL
Baso # K/uL: 0.4 10*3/uL — ABNORMAL HIGH (ref 0.0–0.1)
Basophil %: 0.8 %
Eos # K/uL: 0 10*3/uL (ref 0.0–0.4)
Eosinophil %: 0 %
Hematocrit: 30 % — ABNORMAL LOW (ref 34–45)
Hemoglobin: 9.7 g/dL — ABNORMAL LOW (ref 11.2–15.7)
Lymph # K/uL: 4.3 10*3/uL — ABNORMAL HIGH (ref 1.2–3.7)
Lymphocyte %: 7.5 %
MCH: 36 pg/cell — ABNORMAL HIGH (ref 26–32)
MCHC: 33 g/dL (ref 32–36)
MCV: 111 fL — ABNORMAL HIGH (ref 79–95)
Mono # K/uL: 2.3 10*3/uL — ABNORMAL HIGH (ref 0.2–0.9)
Monocyte %: 4.2 %
Neut # K/uL: 30.5 10*3/uL — ABNORMAL HIGH (ref 1.6–6.1)
Nucl RBC # K/uL: 0.3 10*3/uL — ABNORMAL HIGH (ref 0.0–0.0)
Nucl RBC %: 0.5 /100 WBC — ABNORMAL HIGH (ref 0.0–0.2)
Platelets: 66 10*3/uL — ABNORMAL LOW (ref 160–370)
RBC: 2.7 MIL/uL — ABNORMAL LOW (ref 3.9–5.2)
RDW: 17.6 % — ABNORMAL HIGH (ref 11.7–14.4)
Seg Neut %: 49.1 %
WBC: 53.5 10*3/uL — ABNORMAL HIGH (ref 4.0–10.0)

## 2014-09-14 LAB — COMPREHENSIVE METABOLIC PANEL, PL
ALT, PL: 18 U/L (ref 0–35)
AST, PL: 36 U/L — ABNORMAL HIGH (ref 0–35)
Albumin, PL: 4.5 g/dL (ref 3.5–5.2)
Alk Phos, PL: 61 U/L (ref 35–105)
Anion Gap,PL: 14 (ref 7–16)
Bilirubin Total, PL: 0.4 mg/dL (ref 0.0–1.2)
CO2,Plasma: 25 mmol/L (ref 20–28)
Calcium, PL: 9.1 mg/dL (ref 8.6–10.2)
Chloride,Plasma: 102 mmol/L (ref 96–108)
Creatinine: 0.84 mg/dL (ref 0.51–0.95)
GFR,Black: 91 *
GFR,Caucasian: 79 *
Glucose,Plasma: 115 mg/dL — ABNORMAL HIGH (ref 60–99)
Potassium,Plasma: 4.4 mmol/L (ref 3.4–4.7)
Sodium,Plasma: 141 mmol/L (ref 133–145)
Total Protein, PL: 7.5 g/dL (ref 6.3–7.7)
UN,Plasma: 16 mg/dL (ref 6–20)

## 2014-09-14 LAB — NEUTROPHIL #-INSTRUMENT: Neutrophil #-Instrument: 35 10*3/uL

## 2014-09-14 LAB — DIFF MANUAL
Bands %: 8 % (ref 0–10)
Diff Based On: 120 CELLS
Metamyelocyte %: 9 % — ABNORMAL HIGH (ref 0–1)
Myelocyte %: 22 % — ABNORMAL HIGH (ref 0–0)

## 2014-09-14 NOTE — Telephone Encounter (Signed)
Called pt to inform her of wbc 53.5, hct 30, plt 66 - increase HU to1500 mg q.day, per [name].  Good understanding verbalized.

## 2014-09-28 ENCOUNTER — Ambulatory Visit: Payer: Self-pay | Admitting: Hematology

## 2014-09-28 ENCOUNTER — Ambulatory Visit
Admission: RE | Admit: 2014-09-28 | Discharge: 2014-09-28 | Disposition: A | Discharge status: Home / Self Care | Payer: Self-pay | Source: Ambulatory Visit | Attending: Hematology | Admitting: Hematology

## 2014-09-28 VITALS — BP 134/63 | HR 75 | Temp 95.2°F | Resp 18 | Wt 255.8 lb

## 2014-09-28 DIAGNOSIS — D72829 Elevated white blood cell count, unspecified: Secondary | ICD-10-CM

## 2014-09-28 DIAGNOSIS — D471 Chronic myeloproliferative disease: Secondary | ICD-10-CM

## 2014-09-28 LAB — COMPREHENSIVE METABOLIC PANEL, PL
ALT, PL: 16 U/L (ref 0–35)
AST, PL: 36 U/L — ABNORMAL HIGH (ref 0–35)
Albumin, PL: 4.6 g/dL (ref 3.5–5.2)
Alk Phos, PL: 65 U/L (ref 35–105)
Anion Gap,PL: 15 (ref 7–16)
Bilirubin Total, PL: 0.4 mg/dL (ref 0.0–1.2)
CO2,Plasma: 26 mmol/L (ref 20–28)
Calcium, PL: 9.3 mg/dL (ref 8.6–10.2)
Chloride,Plasma: 99 mmol/L (ref 96–108)
Creatinine: 0.77 mg/dL (ref 0.51–0.95)
GFR,Black: 101 *
GFR,Caucasian: 88 *
Glucose,Plasma: 117 mg/dL — ABNORMAL HIGH (ref 60–99)
Potassium,Plasma: 5 mmol/L — ABNORMAL HIGH (ref 3.4–4.7)
Sodium,Plasma: 140 mmol/L (ref 133–145)
Total Protein, PL: 7.4 g/dL (ref 6.3–7.7)
UN,Plasma: 16 mg/dL (ref 6–20)

## 2014-09-28 LAB — CBC AND DIFFERENTIAL
Baso # K/uL: 0 10*3/uL (ref 0.0–0.1)
Basophil %: 0 %
Eos # K/uL: 0 10*3/uL (ref 0.0–0.4)
Eosinophil %: 0 %
Hematocrit: 30 % — ABNORMAL LOW (ref 34–45)
Hemoglobin: 9.4 g/dL — ABNORMAL LOW (ref 11.2–15.7)
Lymph # K/uL: 1.2 10*3/uL (ref 1.2–3.7)
Lymphocyte %: 1.7 %
MCH: 36 pg/cell — ABNORMAL HIGH (ref 26–32)
MCHC: 32 g/dL (ref 32–36)
MCV: 113 fL — ABNORMAL HIGH (ref 79–95)
Mono # K/uL: 1 10*3/uL — ABNORMAL HIGH (ref 0.2–0.9)
Monocyte %: 1.7 %
Neut # K/uL: 38.7 10*3/uL — ABNORMAL HIGH (ref 1.6–6.1)
Nucl RBC # K/uL: 0.2 10*3/uL — ABNORMAL HIGH (ref 0.0–0.0)
Nucl RBC %: 0.4 /100 WBC — ABNORMAL HIGH (ref 0.0–0.2)
Platelets: 61 10*3/uL — ABNORMAL LOW (ref 160–370)
RBC: 2.6 MIL/uL — ABNORMAL LOW (ref 3.9–5.2)
RDW: 17.9 % — ABNORMAL HIGH (ref 11.7–14.4)
Seg Neut %: 55.4 %
WBC: 59.5 10*3/uL — ABNORMAL HIGH (ref 4.0–10.0)

## 2014-09-28 LAB — DIFF MANUAL
Bands %: 10 % (ref 0–10)
Blasts %: 1 % — ABNORMAL HIGH (ref 0–0)
Diff Based On: 119 CELLS
Metamyelocyte %: 10 % — ABNORMAL HIGH (ref 0–1)
Myelocyte %: 19 % — ABNORMAL HIGH (ref 0–0)
Promyelocyte %: 2 % — ABNORMAL HIGH (ref 0–0)

## 2014-09-28 LAB — NEUTROPHIL #-INSTRUMENT: Neutrophil #-Instrument: 37.8 10*3/uL

## 2014-09-28 LAB — LD, PL: LD, PL: 796 U/L — ABNORMAL HIGH (ref 118–225)

## 2014-09-28 NOTE — Progress Notes (Signed)
Subjective:      Patient ID: Carly Higgins is a 54 y.o. 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 By [facility] testing, she has mutations of TET@, ASCL1, CCND2, GATA2, SRSF2.     HPI  Carly Higgins comes to the office today for her scheduled follow up.  She still has some hot flashes frequently but no  true fevers, night sweats,  or infections.  No chest pain or shortness of breath.  No bleeding, but she does bruise easily.  She is taking Hydroxyurea as prescribed.  No mouth sores, but the oral mucosa is sensitive. She came with her mother today, and we discussed her MPN and its treatments in detail.  She is still smoking, and she would like to lose weight.  She will get a flu shot this autumn.     Review of Systems   All other systems reviewed and are negative.    Objective:     Vitals:    09/28/14 1111   BP: 134/63   Pulse: 75   Resp: 18   Temp: 35.1 C (95.2 F)   Weight: 116 kg (255 lb 13.5 oz)     Laboratory Data:  Recent Results (from the past 24 hour(s))   CBC and differential    Collection Time: 09/28/14  7:06 AM   Result Value Ref Range    WBC 59.5 (H) 4.0 - 10.0 THOU/uL    RBC 2.6 (L) 3.9 - 5.2 MIL/uL    Hemoglobin 9.4 (L) 11.2 - 15.7 g/dL    Hematocrit 30 (L) 34 - 45 %    MCV 113 (H) 79 - 95 fL    MCH 36 (H) 26 - 32 pg/cell    MCHC 32 32 - 36 g/dL    RDW 17.9 (H) 11.7 - 14.4 %    Platelets 61 (L) 160 - 370 THOU/uL    Seg Neut % 55.4 %    Lymphocyte % 1.7 %    Monocyte % 1.7 %    Eosinophil % 0.0 %    Basophil % 0.0 %    Neut # K/uL 38.7 (H) 1.6 - 6.1 THOU/uL    Lymph # K/uL 1.2 1.2 - 3.7 THOU/uL    Mono # K/uL 1.0 (H) 0.2 - 0.9 THOU/uL    Eos # K/uL 0.0 0.0 - 0.4 THOU/uL    Baso # K/uL 0.0 0.0 - 0.1 THOU/uL    Nucl RBC % 0.4 (H) 0.0 - 0.2 /100 WBC    Nucl RBC # K/uL 0.2 (H) 0.0 - 0.0 THOU/uL   Comprehensive Metabolic Panel, PL    Collection Time: 09/28/14  7:06 AM   Result Value Ref Range    Potassium,Plasma 5.0 (H) 3.4 - 4.7 mmol/L     Sodium,Plasma 140 133 - 145 mmol/L    Anion Gap,PL 15 7 - 16    UN,Plasma 16 6 - 20 mg/dL    Creatinine 0.77 0.51 - 0.95 mg/dL    GFR,Caucasian 88 *    GFR,Black 101 *    Glucose,Plasma 117 (H) 60 - 99 mg/dL    Calcium, PL 9.3 8.6 - 10.2 mg/dL    Chloride,Plasma 99 96 - 108 mmol/L    CO2,Plasma 26 20 - 28 mmol/L    Alk Phos, PL 65 35 - 105 U/L    AST, PL 36 (H) 0 - 35 U/L    ALT, PL 16 0 - 35 U/L    Albumin, PL  4.6 3.5 - 5.2 g/dL    Bilirubin Total, PL 0.4 0.0 - 1.2 mg/dL    Total Protein, PL 7.4 6.3 - 7.7 g/dL   LD, PL    Collection Time: 09/28/14  7:06 AM   Result Value Ref Range    LD, PL 796 (H) 118 - 225 U/L   Neutrophil #-Instrument    Collection Time: 09/28/14  7:06 AM   Result Value Ref Range    Neutrophil #-Instrument 37.8 THOU/uL   Diff manual    Collection Time: 09/28/14  7:06 AM   Result Value Ref Range    Bands % 10 0 - 10 %    Metamyelocyte % 10 (H) 0 - 1 %    Myelocyte % 19 (H) 0 - 0 %    Promyelocyte % 2 (H) 0 - 0 %    Blasts % 1 (H) 0 - 0 %    Manual DIFF RESULTS     Diff Based On 119 CELLS       Physical Exam   Constitutional: She is oriented to person, place, and time. She appears well-developed and well-nourished.   HENT:   Mouth/Throat: Oropharynx is clear and moist. No oropharyngeal exudate or ulcers or erythema.   Eyes: No scleral icterus.   Neck: Neck supple.   Cardiovascular: Normal rate and regular rhythm.    Pulmonary/Chest: Effort normal and breath sounds normal.   Abdominal: Soft. Bowel sounds are normal.  No organomegaly.   Neurological: She is alert and oriented to person, place, and time.   Skin: Skin is warm and dry. No rash noted. Tanned.     Assessment:   Carly Higgins is a 54 year old female with unclassified myeloproliferative disorder.  She is tolerating dose adjusted Hydroxyurea.  Degree of anemia and thrombocytopenia are stable.  Her WBC seems stable but has not responded well to the HU, and I am concerned that increasing the dose of HU might lower the Hct and platelets too much.      She has a brother and sister who have not been HLA typed per Carly Higgins's request. She has several potential donors in the unrelated donor registry. We discussed interferon, other alkylating agents, risk of AML transformation, and role of stem cell transplantation.  We reviewed risks and benefits of SCT in general terms. Hypomethylating agents could also be considered.   Plan:     1.  Continue dose adjusted Hydroxyurea.  Current dose is 1,500 mg per day.   2.  Will continue to check CBC every 2 weeks.  3.  Follow up in 3 months.  Will plan to do a marrow in early 2017.

## 2014-10-12 ENCOUNTER — Telehealth: Payer: Self-pay | Admitting: Hematology

## 2014-10-12 ENCOUNTER — Ambulatory Visit
Admission: RE | Admit: 2014-10-12 | Discharge: 2014-10-12 | Disposition: A | Discharge status: Home / Self Care | Payer: Self-pay | Source: Ambulatory Visit | Attending: Hematology | Admitting: Hematology

## 2014-10-12 DIAGNOSIS — D72829 Elevated white blood cell count, unspecified: Secondary | ICD-10-CM

## 2014-10-12 DIAGNOSIS — D471 Chronic myeloproliferative disease: Secondary | ICD-10-CM

## 2014-10-12 LAB — CBC AND DIFFERENTIAL
Baso # K/uL: 0 10*3/uL (ref 0.0–0.1)
Basophil %: 0 %
Eos # K/uL: 0 10*3/uL (ref 0.0–0.4)
Eosinophil %: 0 %
Hematocrit: 27 % — ABNORMAL LOW (ref 34–45)
Hemoglobin: 8.7 g/dL — ABNORMAL LOW (ref 11.2–15.7)
Lymph # K/uL: 2.5 10*3/uL (ref 1.2–3.7)
Lymphocyte %: 4.2 %
MCH: 37 pg/cell — ABNORMAL HIGH (ref 26–32)
MCHC: 32 g/dL (ref 32–36)
MCV: 114 fL — ABNORMAL HIGH (ref 79–95)
Mono # K/uL: 0.5 10*3/uL (ref 0.2–0.9)
Monocyte %: 0.8 %
Neut # K/uL: 41.1 10*3/uL — ABNORMAL HIGH (ref 1.6–6.1)
Nucl RBC # K/uL: 0.3 10*3/uL — ABNORMAL HIGH (ref 0.0–0.0)
Nucl RBC %: 0.5 /100 WBC — ABNORMAL HIGH (ref 0.0–0.2)
Platelets: 47 10*3/uL — ABNORMAL LOW (ref 160–370)
RBC: 2.4 MIL/uL — ABNORMAL LOW (ref 3.9–5.2)
RDW: 18.1 % — ABNORMAL HIGH (ref 11.7–14.4)
Seg Neut %: 53.9 %
WBC: 61.3 10*3/uL — ABNORMAL HIGH (ref 4.0–10.0)

## 2014-10-12 LAB — COMPREHENSIVE METABOLIC PANEL, PL
ALT, PL: 17 U/L (ref 0–35)
AST, PL: 30 U/L (ref 0–35)
Albumin, PL: 4.4 g/dL (ref 3.5–5.2)
Alk Phos, PL: 61 U/L (ref 35–105)
Anion Gap,PL: 14 (ref 7–16)
Bilirubin Total, PL: 0.3 mg/dL (ref 0.0–1.2)
CO2,Plasma: 24 mmol/L (ref 20–28)
Calcium, PL: 8.8 mg/dL (ref 8.6–10.2)
Chloride,Plasma: 103 mmol/L (ref 96–108)
Creatinine: 0.82 mg/dL (ref 0.51–0.95)
GFR,Black: 94 *
GFR,Caucasian: 81 *
Glucose,Plasma: 123 mg/dL — ABNORMAL HIGH (ref 60–99)
Potassium,Plasma: 4.3 mmol/L (ref 3.4–4.7)
Sodium,Plasma: 141 mmol/L (ref 133–145)
Total Protein, PL: 7.2 g/dL (ref 6.3–7.7)
UN,Plasma: 20 mg/dL (ref 6–20)

## 2014-10-12 LAB — NEUTROPHIL #-INSTRUMENT: Neutrophil #-Instrument: 37.2 10*3/uL

## 2014-10-12 LAB — DIFF MANUAL
Bands %: 13 % (ref 0–10)
Blasts %: 3 % — ABNORMAL HIGH (ref 0–0)
Diff Based On: 119 CELLS
Metamyelocyte %: 6 % — ABNORMAL HIGH (ref 0–1)
Myelocyte %: 19 % — ABNORMAL HIGH (ref 0–0)

## 2014-10-12 LAB — LD, PL: LD, PL: 800 U/L — ABNORMAL HIGH (ref 118–225)

## 2014-10-12 NOTE — Telephone Encounter (Signed)
13% bands   Critical lab value called in:     Name of provider:  Assunta Curtis, M.D. And Lanelle Bal RN      Time provider [phone]      Time provider responded:  [phone]      Critical lab and value:  13% bands      Action taken as result of notification:  Paged team

## 2014-10-13 ENCOUNTER — Telehealth: Payer: Self-pay

## 2014-10-13 NOTE — Telephone Encounter (Signed)
Called pt to inform her of wbc 61.3, hct 27%, plt 47K  Instructed per [name] to continue HU 1500 mg q.day.  Patient verbalized good understanding of instructions and is in agreement with plan.

## 2014-10-22 ENCOUNTER — Other Ambulatory Visit: Payer: Self-pay

## 2014-10-22 MED ORDER — HYDROXYUREA 500 MG PO CAPS *I*
1500.0000 mg | ORAL_CAPSULE | Freq: Every day | ORAL | 2 refills | Status: DC
Start: 2014-10-22 — End: 2014-12-27

## 2014-10-26 ENCOUNTER — Telehealth: Payer: Self-pay

## 2014-10-26 ENCOUNTER — Telehealth: Payer: Self-pay | Admitting: Hematology

## 2014-10-26 ENCOUNTER — Ambulatory Visit
Admission: RE | Admit: 2014-10-26 | Discharge: 2014-10-26 | Disposition: A | Discharge status: Home / Self Care | Payer: Self-pay | Source: Ambulatory Visit | Attending: Hematology | Admitting: Hematology

## 2014-10-26 DIAGNOSIS — D471 Chronic myeloproliferative disease: Secondary | ICD-10-CM

## 2014-10-26 DIAGNOSIS — D72829 Elevated white blood cell count, unspecified: Secondary | ICD-10-CM

## 2014-10-26 LAB — DIFF MANUAL
Bands %: 16 % (ref 0–10)
Blasts %: 4 % — ABNORMAL HIGH (ref 0–0)
Diff Based On: 125 CELLS
Metamyelocyte %: 11 % — ABNORMAL HIGH (ref 0–1)
Myelocyte %: 20 % — ABNORMAL HIGH (ref 0–0)

## 2014-10-26 LAB — COMPREHENSIVE METABOLIC PANEL, PL
ALT, PL: 23 U/L (ref 0–35)
AST, PL: 34 U/L (ref 0–35)
Albumin, PL: 4.4 g/dL (ref 3.5–5.2)
Alk Phos, PL: 70 U/L (ref 35–105)
Anion Gap,PL: 15 (ref 7–16)
Bilirubin Total, PL: 0.4 mg/dL (ref 0.0–1.2)
CO2,Plasma: 25 mmol/L (ref 20–28)
Calcium, PL: 9.5 mg/dL (ref 8.6–10.2)
Chloride,Plasma: 101 mmol/L (ref 96–108)
Creatinine: 0.91 mg/dL (ref 0.51–0.95)
GFR,Black: 82 *
GFR,Caucasian: 72 *
Glucose,Plasma: 133 mg/dL — ABNORMAL HIGH (ref 60–99)
Potassium,Plasma: 4.6 mmol/L (ref 3.4–4.7)
Sodium,Plasma: 141 mmol/L (ref 133–145)
Total Protein, PL: 7.4 g/dL (ref 6.3–7.7)
UN,Plasma: 19 mg/dL (ref 6–20)

## 2014-10-26 LAB — CBC AND DIFFERENTIAL
Baso # K/uL: 0 10*3/uL (ref 0.0–0.1)
Basophil %: 0 %
Eos # K/uL: 0 10*3/uL (ref 0.0–0.4)
Eosinophil %: 0 %
Hematocrit: 27 % — ABNORMAL LOW (ref 34–45)
Hemoglobin: 8.6 g/dL — ABNORMAL LOW (ref 11.2–15.7)
Lymph # K/uL: 1.7 10*3/uL (ref 1.2–3.7)
Lymphocyte %: 2.4 %
MCH: 37 pg/cell — ABNORMAL HIGH (ref 26–32)
MCHC: 32 g/dL (ref 32–36)
MCV: 114 fL — ABNORMAL HIGH (ref 79–95)
Mono # K/uL: 0 10*3/uL — ABNORMAL LOW (ref 0.2–0.9)
Monocyte %: 0 %
Neut # K/uL: 52.7 10*3/uL — ABNORMAL HIGH (ref 1.6–6.1)
Nucl RBC # K/uL: 0.4 10*3/uL — ABNORMAL HIGH (ref 0.0–0.0)
Nucl RBC %: 0.5 /100 WBC — ABNORMAL HIGH (ref 0.0–0.2)
Platelets: 50 10*3/uL — ABNORMAL LOW (ref 160–370)
RBC: 2.3 MIL/uL — ABNORMAL LOW (ref 3.9–5.2)
RDW: 18.3 % — ABNORMAL HIGH (ref 11.7–14.4)
Seg Neut %: 46.4 %
WBC: 85 10*3/uL — ABNORMAL HIGH (ref 4.0–10.0)

## 2014-10-26 LAB — LD, PL: LD, PL: 861 U/L — ABNORMAL HIGH (ref 118–225)

## 2014-10-26 LAB — NEUTROPHIL #-INSTRUMENT: Neutrophil #-Instrument: 54 10*3/uL

## 2014-10-26 NOTE — Telephone Encounter (Signed)
Critical lab value called in:     Name of provider:Dr Lytle Butte     Time provider notified:8:20     Time provider responded:  8:23     Critical lab and value: Bands 16     Action taken as result of notification: Dr Lytle Butte aware. Will increase hydroxurea

## 2014-10-26 NOTE — Telephone Encounter (Signed)
Talked to Catia she states she is very fatigued. She doesn't understanding why her counts keep going up.   Doesn't feel like herself. I discussed with [name] and she wants to move up her appointment in December to 11/1 @ 1pm to talk about different treatments I let Chandrea know.  Good understanding.

## 2014-10-26 NOTE — Telephone Encounter (Signed)
Noted  

## 2014-10-26 NOTE — Telephone Encounter (Signed)
Left message:Informed patient her wbc 85, hct 27%, plts 50K.   Instructed patient per [name] to increase hydroxyurea to 2000mg / day.  Asked patient to call back so I know she got the message.

## 2014-10-26 NOTE — Telephone Encounter (Signed)
Left message to call back  

## 2014-11-01 ENCOUNTER — Telehealth: Payer: Self-pay | Admitting: Hematology

## 2014-11-08 ENCOUNTER — Other Ambulatory Visit: Payer: Self-pay | Admitting: Oncology

## 2014-11-08 ENCOUNTER — Telehealth: Payer: Self-pay | Admitting: Hematology

## 2014-11-08 DIAGNOSIS — D471 Chronic myeloproliferative disease: Secondary | ICD-10-CM

## 2014-11-08 MED ORDER — PROCHLORPERAZINE MALEATE 10 MG PO TABS *I*
10.0000 mg | ORAL_TABLET | Freq: Four times a day (QID) | ORAL | 6 refills | Status: DC | PRN
Start: 2014-11-08 — End: 2020-05-19

## 2014-11-08 NOTE — Telephone Encounter (Signed)
Compazine with 6 refills E-prescribed to the patients pharmacy.

## 2014-11-09 ENCOUNTER — Ambulatory Visit
Admission: RE | Admit: 2014-11-09 | Discharge: 2014-11-09 | Disposition: A | Discharge status: Home / Self Care | Payer: Self-pay | Source: Ambulatory Visit | Attending: Hematology | Admitting: Hematology

## 2014-11-09 ENCOUNTER — Ambulatory Visit: Payer: Self-pay | Admitting: Oncology

## 2014-11-09 VITALS — BP 146/66 | HR 85 | Temp 96.4°F | Resp 18 | Wt 260.8 lb

## 2014-11-09 DIAGNOSIS — D72829 Elevated white blood cell count, unspecified: Secondary | ICD-10-CM

## 2014-11-09 DIAGNOSIS — D471 Chronic myeloproliferative disease: Secondary | ICD-10-CM

## 2014-11-09 LAB — COMPREHENSIVE METABOLIC PANEL, PL
ALT, PL: 22 U/L (ref 0–35)
AST, PL: 31 U/L (ref 0–35)
Albumin, PL: 4.3 g/dL (ref 3.5–5.2)
Alk Phos, PL: 65 U/L (ref 35–105)
Anion Gap,PL: 13 (ref 7–16)
Bilirubin Total, PL: 0.4 mg/dL (ref 0.0–1.2)
CO2,Plasma: 25 mmol/L (ref 20–28)
Calcium, PL: 9 mg/dL (ref 8.6–10.2)
Chloride,Plasma: 105 mmol/L (ref 96–108)
Creatinine: 0.85 mg/dL (ref 0.51–0.95)
GFR,Black: 90 *
GFR,Caucasian: 78 *
Glucose,Plasma: 150 mg/dL — ABNORMAL HIGH (ref 60–99)
Potassium,Plasma: 4.3 mmol/L (ref 3.4–4.7)
Sodium,Plasma: 143 mmol/L (ref 133–145)
Total Protein, PL: 7.1 g/dL (ref 6.3–7.7)
UN,Plasma: 20 mg/dL (ref 6–20)

## 2014-11-09 LAB — DIFF MANUAL
Bands %: 13 % (ref 0–10)
Blasts %: 2 % — ABNORMAL HIGH (ref 0–0)
Diff Based On: 200 CELLS
Metamyelocyte %: 11 % — ABNORMAL HIGH (ref 0–1)
Myelocyte %: 7 % — ABNORMAL HIGH (ref 0–0)
Promyelocyte %: 2 % — ABNORMAL HIGH (ref 0–0)

## 2014-11-09 LAB — CBC AND DIFFERENTIAL
Baso # K/uL: 0 10*3/uL (ref 0.0–0.1)
Basophil %: 0 %
Eos # K/uL: 0 10*3/uL (ref 0.0–0.4)
Eosinophil %: 0 %
Hematocrit: 26 % — ABNORMAL LOW (ref 34–45)
Hemoglobin: 8.3 g/dL — ABNORMAL LOW (ref 11.2–15.7)
Lymph # K/uL: 3.4 10*3/uL (ref 1.2–3.7)
Lymphocyte %: 5 %
MCH: 38 pg/cell — ABNORMAL HIGH (ref 26–32)
MCHC: 33 g/dL (ref 32–36)
MCV: 116 fL — ABNORMAL HIGH (ref 79–95)
Mono # K/uL: 0 10*3/uL — ABNORMAL LOW (ref 0.2–0.9)
Monocyte %: 0 %
Neut # K/uL: 50.7 10*3/uL — ABNORMAL HIGH (ref 1.6–6.1)
Nucl RBC # K/uL: 0.2 10*3/uL — ABNORMAL HIGH (ref 0.0–0.0)
Nucl RBC %: 0.3 /100 WBC — ABNORMAL HIGH (ref 0.0–0.2)
Platelets: 36 10*3/uL — ABNORMAL LOW (ref 160–370)
RBC: 2.2 MIL/uL — ABNORMAL LOW (ref 3.9–5.2)
RDW: 19 % — ABNORMAL HIGH (ref 11.7–14.4)
Seg Neut %: 61.5 %
WBC: 67.6 10*3/uL — ABNORMAL HIGH (ref 4.0–10.0)

## 2014-11-09 LAB — LD, PL: LD, PL: 860 U/L — ABNORMAL HIGH (ref 118–225)

## 2014-11-09 LAB — NEUTROPHIL #-INSTRUMENT: Neutrophil #-Instrument: 41.8 10*3/uL

## 2014-11-09 NOTE — Patient Instructions (Signed)
Decitabine (Dacogen) - given intervenously over about 1-2 hour, usually for myelodysplastic syndromes (MDS)    Possible side effects:   fatigue, headache, nausea, constipation, diarrhea, cough, high blood sugar, fever, or mouth sores.    This medicine may cause bone marrow suppression (low blood counts):   A decrease in white blood cells    increases risk for infections   signs of infection: fever, chills, cough, sore throat, pain or difficulty passing urine    A decrease in red blood cells   causes weakness, feeling tired (fatigued), fainting spells, dizziness or lightheadedness, shortness of breath, or heart palpitations   A decrease in platelets    may cause bleeding, bruising, pinpoint red spots on the skin, nose bleeds, vaginal bleeding, bloody or black, tarry stools, or blood in the urine          Decitabine  Printed on 2014-11-09    You must carefully read the "Consumer Information Use and Disclaimer" below in order to understand and correctly use this information    Pronunciation   (de SYE ta been)  Brand Names: [place]   Dacogen  What is this drug used for?   It is used to treat a health problem called myelodysplastic syndrome (MDS).   It may be given to you for other reasons. Talk with the doctor.  What do I need to tell my doctor BEFORE I take this drug?   If you have an allergy to decitabine or any other part of this drug.   If you are allergic to any drugs like this one, any other drugs, foods, or other substances. Tell your doctor about the allergy and what signs you had, like rash; hives; itching; shortness of breath; wheezing; cough; swelling of face, lips, tongue, or throat; or any other signs.   If you are breast-feeding or plan to breast-feed.   This drug may interact with other drugs or health problems.   Tell your doctor and pharmacist about all of your drugs (prescription or OTC, natural products, vitamins) and health problems. You must check to make sure that it is safe for you to take  this drug with all of your drugs and health problems. Do not start, stop, or change the dose of any drug without checking with your doctor.  What are some things I need to know or do while I take this drug?   Tell all of your health care providers that you take this drug. This includes your doctors, nurses, pharmacists, and dentists.   Have blood work checked as you have been told by the doctor. Talk with the doctor.   You may have more chance of getting an infection. Wash hands often. Stay away from people with infections, colds, or flu.   You may bleed more easily. Be careful and avoid injury. Use a soft toothbrush and an Copy.   Very bad and sometimes deadly bleeding problems have happened with this drug. Talk with the doctor.   If you have upset stomach, throwing up, loose stools (diarrhea), or are not hungry, talk with your doctor. There may be ways to lower these side effects.   If you have high blood sugar (diabetes), talk with your doctor. This drug may raise blood sugar.   Check your blood sugar as you have been told by your doctor.   If you are a man and have sex with a female who could get pregnant, protect her from pregnancy during care and for 2 months after stopping  this drug. Use birth control that you can trust.   If you are a man and your sex partner gets pregnant while you take this drug or within 2 months after your last dose, call your doctor right away.   This drug may cause harm to the unborn baby if you take it while you are pregnant.   Use birth control that you can trust to prevent pregnancy while taking this drug and for 1 month after stopping this drug.   If you get pregnant while taking this drug or within 1 month after your last dose, call your doctor right away.  What are some side effects that I need to call my doctor about right away?   WARNING/CAUTION: Even though it may be rare, some people may have very bad and sometimes deadly side effects when taking a  drug. Tell your doctor or get medical help right away if you have any of the following signs or symptoms that may be related to a very bad side effect:   Signs of an allergic reaction, like rash; hives; itching; red, swollen, blistered, or peeling skin with or without fever; wheezing; tightness in the chest or throat; trouble breathing or talking; unusual hoarseness; or swelling of the mouth, face, lips, tongue, or throat.   Signs of infection like fever, chills, very bad sore throat, ear or sinus pain, cough, more sputum or change in color of sputum, pain with passing urine, mouth sores, or wound that will not heal.   Signs of bleeding like throwing up blood or throw up that looks like coffee grounds; coughing up blood; blood in the urine; black, red, or tarry stools; bleeding from the gums; vaginal bleeding that is not normal; bruises without a reason or that get bigger; or any bleeding that is very bad or that you cannot stop.   Signs of high blood sugar like confusion, feeling sleepy, more thirst, more hungry, passing urine more often, flushing, fast breathing, or breath that smells like fruit.   Signs of fluid and electrolyte problems like mood changes, confusion, muscle pain or weakness, a heartbeat that does not feel normal, very bad dizziness or passing out, fast heartbeat, more thirst, seizures, feeling very tired or weak, not hungry, unable to pass urine or change in the amount of urine produced, dry mouth, dry eyes, or very bad upset stomach or throwing up.   Feeling very tired or weak.   Pale skin.   Yellow skin or eyes.   Swollen gland.   Shortness of breath.   Swelling.   Blurred eyesight.   Trouble swallowing.   Chest pain or pressure.   A fast heartbeat.   Numbness and tingling.  What are some other side effects of this drug?   All drugs may cause side effects. However, many people have no side effects or only have minor side effects. Call your doctor or get medical help if any of  these side effects or any other side effects bother you or do not go away:   Feeling tired or weak.   Belly pain.   Upset stomach or throwing up.   Mouth irritation or mouth sores.   Pinpoint red spots on the skin.   Hard stools (constipation).   Loose stools (diarrhea).   Not hungry.   Not able to sleep.   Dizziness.   Headache.   Itching.   Back pain.   Muscle or joint pain.   Anxiety.   Hair loss.   These  are not all of the side effects that may occur. If you have questions about side effects, call your doctor. Call your doctor for medical advice about side effects.   You may report side effects to your national health agency.  How is this drug best taken?   Use this drug as ordered by your doctor. Read all information given to you. Follow all instructions closely.   It is given as an infusion into a vein over a period of time.   Other drugs may be given before this drug to help avoid side effects.  What do I do if I miss a dose?   Call your doctor to find out what to do.  How do I store and/or throw out this drug?   If you need to store this drug at home, talk with your doctor, nurse, or pharmacist about how to store it.  General drug facts   If your symptoms or health problems do not get better or if they become worse, call your doctor.   Do not share your drugs with others and do not take anyone else's drugs.   Keep a list of all your drugs (prescription, natural products, vitamins, OTC) with you. Give this list to your doctor.   Talk with the doctor before starting any new drug, including prescription or OTC, natural products, or vitamins.   Keep all drugs in a safe place. Keep all drugs out of the reach of children and pets.   Check with your pharmacist about how to throw out unused drugs.   Some drugs may have another patient information leaflet. If you have any questions about this drug, please talk with your doctor, nurse, pharmacist, or other health care provider.   If you  think there has been an overdose, call your poison control center or get medical care right away. Be ready to tell or show what was taken, how much, and when it happened.

## 2014-11-12 NOTE — Progress Notes (Signed)
Subjective:      Patient ID: Carly Higgins Carly Higgins Higgins is a 54 y.o. 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 By [facility] testing, she has mutations of TET@, ASCL1, CCND2, GATA2, SRSF2.     HPI  Carly Higgins Carly Higgins Higgins comes to the office today for her scheduled follow up.  She is accompanied by her mother.  Carly Higgins Higgins reports feeling "ok".  She is not having any fevers or infections.  No chest pain or shortness of breath.  No bleeding.  She does feel a bit fatigue but she continues to work full time.  She is taking hydroxyurea daily.  No mouth sores.    Review of Systems   All other systems reviewed and are negative.    Objective:     Vitals:    11/09/14 1316   BP: 146/66   Pulse: 85   Resp: 18   Temp: 35.8 C (96.4 F)   Weight: 118.3 kg (260 lb 12.9 oz)     Laboratory Data:  Results for Carly Higgins, Carly Higgins Higgins (MRN [id number]) as of 11/12/2014 09:52   Ref. Range 10/12/2014 07:09 10/26/2014 07:07 11/09/2014 07:08   WBC Latest Ref Range: 4.0 - 10.0 THOU/uL 61.3 (H) 85.0 (H) 67.6 (H)   RBC Latest Ref Range: 3.9 - 5.2 MIL/uL 2.4 (L) 2.3 (L) 2.2 (L)   Hemoglobin Latest Ref Range: 11.2 - 15.7 g/dL 8.7 (L) 8.6 (L) 8.3 (L)   Hematocrit Latest Ref Range: 34 - 45 % 27 (L) 27 (L) 26 (L)   MCV Latest Ref Range: 79 - 95 fL 114 (H) 114 (H) 116 (H)   MCH Latest Ref Range: 26 - 32 pg/cell 37 (H) 37 (H) 38 (H)   MCHC Latest Ref Range: 32 - 36 g/dL 32 32 33   RDW Latest Ref Range: 11.7 - 14.4 % 18.1 (H) 18.3 (H) 19.0 (H)   Platelets Latest Ref Range: 160 - 370 THOU/uL 47 (L) 50 (L) 36 (L)   Giant PLTs Unknown  Present    Manual DIFF Unknown RESULTS RESULTS RESULTS   Neut # K/uL Latest Ref Range: 1.6 - 6.1 THOU/uL 41.1 (H) 52.7 (H) 50.7 (H)   Neutrophil #-Instrument Latest Units: THOU/uL 37.2 54.0 41.8   Lymph # K/uL Latest Ref Range: 1.2 - 3.7 THOU/uL 2.5 1.7 3.4   Mono # K/uL Latest Ref Range: 0.2 - 0.9 THOU/uL 0.5 0.0 (L) 0.0 (L)   Eos # K/uL Latest Ref Range: 0.0 - 0.4 THOU/uL 0.0 0.0 0.0   Baso #  K/uL Latest Ref Range: 0.0 - 0.1 THOU/uL 0.0 0.0 0.0   Nucl RBC # K/uL Latest Ref Range: 0.0 - 0.0 THOU/uL 0.3 (H) 0.4 (H) 0.2 (H)   Seg Neut % Latest Units: % 53.9 46.4 61.5   Lymphocyte % Latest Units: % 4.2 2.4 5.0   Monocyte % Latest Units: % 0.8 0.0 0.0   Eosinophil % Latest Units: % 0.0 0.0 0.0   Basophil % Latest Units: % 0.0 0.0 0.0   Nucl RBC % Latest Ref Range: 0.0 - 0.2 /100 WBC 0.5 (H) 0.5 (H) 0.3 (H)   Bands % Latest Ref Range: 0 - 10 % 13 (HH) 16 (HH) 13 (HH)   Metamyelocyte % Latest Ref Range: 0 - 1 % 6 (H) 11 (H) 11 (H)   Myelocyte % Latest Ref Range: 0 - 0 % 19 (H) 20 (H) 7 (H)   Promyelocyte % Latest Ref Range: 0 - 0 %   2 (  H)   Blasts % Latest Ref Range: 0 - 0 % 3 (H) 4 (H) 2 (H)   Diff Based On Latest Units: CELLS 119 125 200   Results for Carly Higgins, Carly Higgins Higgins (MRN [id number]) as of 11/12/2014 09:52   Ref. Range 11/09/2014 07:08   Sodium,Plasma Latest Ref Range: 133 - 145 mmol/L 143   Potassium,Plasma Latest Ref Range: 3.4 - 4.7 mmol/L 4.3   Chloride,Plasma Latest Ref Range: 96 - 108 mmol/L 105   CO2,Plasma Latest Ref Range: 20 - 28 mmol/L 25   Anion Gap,PL Latest Ref Range: 7 - 16  13   UN,Plasma Latest Ref Range: 6 - 20 mg/dL 20   Creatinine Latest Ref Range: 0.51 - 0.95 mg/dL 0.85   GFR,Black Latest Units: * 90   GFR,Caucasian Latest Units: * 78   Glucose,Plasma Latest Ref Range: 60 - 99 mg/dL 150 (H)   Calcium, PL Latest Ref Range: 8.6 - 10.2 mg/dL 9.0   Total Protein, PL Latest Ref Range: 6.3 - 7.7 g/dL 7.1   Albumin, PL Latest Ref Range: 3.5 - 5.2 g/dL 4.3   LD, PL Latest Ref Range: 118 - 225 U/L 860 (H)   ALT, PL Latest Ref Range: 0 - 35 U/L 22   AST, PL Latest Ref Range: 0 - 35 U/L 31   Alk Phos, PL Latest Ref Range: 35 - 105 U/L 65   Bilirubin Total, PL Latest Ref Range: 0.0 - 1.2 mg/dL 0.4     Physical Exam   Constitutional: She is oriented to person, place, and time. She appears well-developed and well-nourished.   HENT:   Mouth/Throat: Oropharynx is clear and moist. No oropharyngeal exudate.    Eyes: No scleral icterus.   Neck: Neck supple.   Cardiovascular: Normal rate and regular rhythm.    Pulmonary/Chest: Effort normal and breath sounds normal.   Abdominal: Soft. Bowel sounds are normal.   Neurological: She is alert and oriented to person, place, and time.   Skin: Skin is warm and dry. No rash noted.     Assessment:     Carly Higgins Carly Higgins Higgins is a 54 year old female with unclassified myeloproliferative disorder.  She is tolerating dose adjusted Hydroxyurea.  Degree of anemia and thrombocytopenia are stable.  Her WBC seems stable but has not responded well to the HU, and I am concerned that increasing the dose of HU might lower the Hct and platelets too much.     She has a brother and sister who have not been HLA typed per Carly Higgins Carly Higgins Higgins's request. She has several potential donors in the unrelated donor registry. We discussed interferon, other alkylating agents, risk of AML transformation, and role of stem cell transplantation.  We reviewed risks and benefits of SCT in general terms. Hypomethylating agents could also be considered.     Plan:     1.  Continue dose adjusted Hydroxyurea.  Will continue to check labs every week.  2.  The patient was given teaching information on Decitabine to review.  We will see her back in 1 month and will plan to start decitabine at that time.    Jasmine Awe RN,MS  Nurse Practitioner

## 2014-11-12 NOTE — Progress Notes (Deleted)
Subjective:      Patient ID: Carly Higgins is a 54 y.o. female   Subjective:      Patient ID: Carly Higgins is a 54 y.o. 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 By [facility] testing, she has mutations of TET@, ASCL1, CCND2, GATA2, SRSF2.     HPI  Carly Higgins comes to the office today for her scheduled follow up.  She still has some hot flashes frequently but no  true fevers, night sweats,  or infections.  No chest pain or shortness of breath.  No bleeding, but she does bruise easily.  She is taking Hydroxyurea as prescribed.  No mouth sores, but the oral mucosa is sensitive. She came with her mother today, and we discussed her MPN and its treatments in detail.  She is still smoking, and she would like to lose weight.  She will get a flu shot this autumn.     Review of Systems   All other systems reviewed and are negative.    Objective:     Vitals:    11/09/14 1316   BP: 146/66   Pulse: 85   Resp: 18   Temp: 35.8 C (96.4 F)   Weight: 118.3 kg (260 lb 12.9 oz)     Laboratory Data:  No results found for this or any previous visit (from the past 24 hour(s)).    Physical Exam   Constitutional: She is oriented to person, place, and time. She appears well-developed and well-nourished.   HENT:   Mouth/Throat: Oropharynx is clear and moist. No oropharyngeal exudate or ulcers or erythema.   Eyes: No scleral icterus.   Neck: Neck supple.   Cardiovascular: Normal rate and regular rhythm.    Pulmonary/Chest: Effort normal and breath sounds normal.   Abdominal: Soft. Bowel sounds are normal.  No organomegaly.   Neurological: She is alert and oriented to person, place, and time.   Skin: Skin is warm and dry. No rash noted. Tanned.     Assessment:   Carly Higgins is a 54 year old female with unclassified myeloproliferative disorder.  She is tolerating dose adjusted Hydroxyurea.  Degree of anemia and thrombocytopenia are stable.  Her WBC seems stable but has not  responded well to the HU, and I am concerned that increasing the dose of HU might lower the Hct and platelets too much.     She has a brother and sister who have not been HLA typed per Carly Higgins's request. She has several potential donors in the unrelated donor registry. We discussed interferon, other alkylating agents, risk of AML transformation, and role of stem cell transplantation.  We reviewed risks and benefits of SCT in general terms. Hypomethylating agents could also be considered.   Plan:     1.  Continue dose adjusted Hydroxyurea.  Current dose is 1,500 mg per day.   2.  Will continue to check CBC every 2 weeks.  3.  Follow up in 3 months.  Will plan to do a marrow in early 2017.                   HPI  {Common Hem/Onc SmartLinks:21514}    Review of Systems        Objective:     Vitals:    11/09/14 1316   BP: 146/66   Pulse: 85   Resp: 18   Temp: 35.8 C (96.4 F)   Weight: 118.3 kg (260 lb 12.9 oz)  No results found for this or any previous visit (from the past 72 hour(s)).        Physical Exam    Assessment:     ***    Plan:     ***

## 2014-11-12 NOTE — Progress Notes (Deleted)
Subjective:      Patient ID: Carly Higgins is a 54 y.o. female   Subjective:      Patient ID: Carly Higgins is a 54 y.o. 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 By [employer] testing, she has mutations of TET@, ASCL1, CCND2, GATA2, SRSF2.     HPI  Carly Higgins comes to the office today for her scheduled follow up.  She still has some hot flashes frequently but no  true fevers, night sweats,  or infections.  No chest pain or shortness of breath.  No bleeding, but she does bruise easily.  She is taking Hydroxyurea as prescribed.  No mouth sores, but the oral mucosa is sensitive. She came with her mother today, and we discussed her MPN and its treatments in detail.  She is still smoking, and she would like to lose weight.  She will get a flu shot this autumn.     Review of Systems   All other systems reviewed and are negative.    Objective:     Vitals:    11/09/14 1316   BP: 146/66   Pulse: 85   Resp: 18   Temp: 35.8 °C (96.4 °F)   Weight: 118.3 kg (260 lb 12.9 oz)     Laboratory Data:  No results found for this or any previous visit (from the past 24 hour(s)).    Physical Exam   Constitutional: She is oriented to person, place, and time. She appears well-developed and well-nourished.   HENT:   Mouth/Throat: Oropharynx is clear and moist. No oropharyngeal exudate or ulcers or erythema.   Eyes: No scleral icterus.   Neck: Neck supple.   Cardiovascular: Normal rate and regular rhythm.    Pulmonary/Chest: Effort normal and breath sounds normal.   Abdominal: Soft. Bowel sounds are normal.  No organomegaly.   Neurological: She is alert and oriented to person, place, and time.   Skin: Skin is warm and dry. No rash noted. Tanned.     Assessment:   Carly Higgins is a 54 year old female with unclassified myeloproliferative disorder.  She is tolerating dose adjusted Hydroxyurea.  Degree of anemia and thrombocytopenia are stable.  Her WBC seems stable but has not  responded well to the HU, and I am concerned that increasing the dose of HU might lower the Hct and platelets too much.     She has a brother and sister who have not been HLA typed per Sona's request. She has several potential donors in the unrelated donor registry. We discussed interferon, other alkylating agents, risk of AML transformation, and role of stem cell transplantation.  We reviewed risks and benefits of SCT in general terms. Hypomethylating agents could also be considered.   Plan:     1.  Continue dose adjusted Hydroxyurea.  Current dose is 1,500 mg per day.   2.  Will continue to check CBC every 2 weeks.  3.  Follow up in 3 months.  Will plan to do a marrow in early 2017.                   HPI  {Common Hem/Onc SmartLinks:21514}    Review of Systems        Objective:     Vitals:    11/09/14 1316   BP: 146/66   Pulse: 85   Resp: 18   Temp: 35.8 °C (96.4 °F)   Weight: 118.3 kg (260 lb 12.9 oz)         No results found for this or any previous visit (from the past 72 hour(s)).        Physical Exam    Assessment:     ***    Plan:     ***

## 2014-11-15 ENCOUNTER — Other Ambulatory Visit: Payer: Self-pay | Admitting: Hematology

## 2014-11-15 NOTE — Telephone Encounter (Signed)
Left message. Informed Carly Higgins I talked to [place] about hydroxyurea prescription.   It is all set for her to pick up.

## 2014-11-23 ENCOUNTER — Ambulatory Visit
Admission: RE | Admit: 2014-11-23 | Discharge: 2014-11-23 | Disposition: A | Discharge status: Home / Self Care | Payer: Self-pay | Source: Ambulatory Visit | Attending: Hematology | Admitting: Hematology

## 2014-11-23 ENCOUNTER — Telehealth: Payer: Self-pay | Admitting: Hematology

## 2014-11-23 DIAGNOSIS — D471 Chronic myeloproliferative disease: Secondary | ICD-10-CM

## 2014-11-23 DIAGNOSIS — D72829 Elevated white blood cell count, unspecified: Secondary | ICD-10-CM

## 2014-11-23 LAB — CBC AND DIFFERENTIAL
Baso # K/uL: 0 10*3/uL (ref 0.0–0.1)
Basophil %: 0 %
Eos # K/uL: 0.5 10*3/uL — ABNORMAL HIGH (ref 0.0–0.4)
Eosinophil %: 0.8 %
Hematocrit: 24 % — ABNORMAL LOW (ref 34–45)
Hemoglobin: 8 g/dL — ABNORMAL LOW (ref 11.2–15.7)
Lymph # K/uL: 2.7 10*3/uL (ref 1.2–3.7)
Lymphocyte %: 2.5 %
MCH: 38 pg/cell — ABNORMAL HIGH (ref 26–32)
MCHC: 33 g/dL (ref 32–36)
MCV: 115 fL — ABNORMAL HIGH (ref 79–95)
Mono # K/uL: 0 10*3/uL — ABNORMAL LOW (ref 0.2–0.9)
Monocyte %: 0 %
Neut # K/uL: 51.8 10*3/uL — ABNORMAL HIGH (ref 1.6–6.1)
Nucl RBC # K/uL: 0.2 10*3/uL — ABNORMAL HIGH (ref 0.0–0.0)
Nucl RBC %: 0.3 /100 WBC — ABNORMAL HIGH (ref 0.0–0.2)
Platelets: 32 10*3/uL — ABNORMAL LOW (ref 160–370)
RBC: 2.1 MIL/uL — ABNORMAL LOW (ref 3.9–5.2)
RDW: 19.6 % — ABNORMAL HIGH (ref 11.7–14.4)
Seg Neut %: 67.1 %
WBC: 67.3 10*3/uL — ABNORMAL HIGH (ref 4.0–10.0)

## 2014-11-23 LAB — DIFF MANUAL
Bands %: 10 % (ref 0–10)
Diff Based On: 118 CELLS
Metamyelocyte %: 5 % — ABNORMAL HIGH (ref 0–1)
Myelocyte %: 11 % — ABNORMAL HIGH (ref 0–0)
Promyelocyte %: 3 % — ABNORMAL HIGH (ref 0–0)
React Lymph %: 1 % (ref 0–6)

## 2014-11-23 LAB — COMPREHENSIVE METABOLIC PANEL, PL
ALT, PL: 16 U/L (ref 0–35)
AST, PL: 33 U/L (ref 0–35)
Albumin, PL: 4.5 g/dL (ref 3.5–5.2)
Alk Phos, PL: 65 U/L (ref 35–105)
Anion Gap,PL: 16 (ref 7–16)
Bilirubin Total, PL: 0.4 mg/dL (ref 0.0–1.2)
CO2,Plasma: 25 mmol/L (ref 20–28)
Calcium, PL: 9.2 mg/dL (ref 8.6–10.2)
Chloride,Plasma: 100 mmol/L (ref 96–108)
Creatinine: 0.85 mg/dL (ref 0.51–0.95)
GFR,Black: 90 *
GFR,Caucasian: 78 *
Glucose,Plasma: 162 mg/dL — ABNORMAL HIGH (ref 60–99)
Potassium,Plasma: 4.2 mmol/L (ref 3.4–4.7)
Sodium,Plasma: 141 mmol/L (ref 133–145)
Total Protein, PL: 7.3 g/dL (ref 6.3–7.7)
UN,Plasma: 19 mg/dL (ref 6–20)

## 2014-11-23 LAB — LD, PL: LD, PL: 853 U/L — ABNORMAL HIGH (ref 118–225)

## 2014-11-23 LAB — NEUTROPHIL #-INSTRUMENT: Neutrophil #-Instrument: 42.4 10*3/uL

## 2014-11-23 NOTE — Telephone Encounter (Signed)
Informed Carly Higgins of her lab results. Let her know that per [name] continue the 2000 mg of hydroxyurea per day.   Good understanding.

## 2014-12-07 ENCOUNTER — Ambulatory Visit
Admission: RE | Admit: 2014-12-07 | Discharge: 2014-12-07 | Disposition: A | Discharge status: Home / Self Care | Payer: Self-pay | Source: Ambulatory Visit | Attending: Hematology | Admitting: Hematology

## 2014-12-07 ENCOUNTER — Telehealth: Payer: Self-pay | Admitting: Hematology

## 2014-12-07 DIAGNOSIS — D72829 Elevated white blood cell count, unspecified: Secondary | ICD-10-CM

## 2014-12-07 DIAGNOSIS — D471 Chronic myeloproliferative disease: Secondary | ICD-10-CM

## 2014-12-07 LAB — COMPREHENSIVE METABOLIC PANEL, PL
ALT, PL: 18 U/L (ref 0–35)
AST, PL: 29 U/L (ref 0–35)
Albumin, PL: 4.4 g/dL (ref 3.5–5.2)
Alk Phos, PL: 61 U/L (ref 35–105)
Anion Gap,PL: 17 — ABNORMAL HIGH (ref 7–16)
Bilirubin Total, PL: 0.3 mg/dL (ref 0.0–1.2)
CO2,Plasma: 22 mmol/L (ref 20–28)
Calcium, PL: 8.9 mg/dL (ref 8.6–10.2)
Chloride,Plasma: 103 mmol/L (ref 96–108)
Creatinine: 0.81 mg/dL (ref 0.51–0.95)
GFR,Black: 95 *
GFR,Caucasian: 82 *
Glucose,Plasma: 125 mg/dL — ABNORMAL HIGH (ref 60–99)
Potassium,Plasma: 5.2 mmol/L — ABNORMAL HIGH (ref 3.4–4.7)
Sodium,Plasma: 142 mmol/L (ref 133–145)
Total Protein, PL: 7.2 g/dL (ref 6.3–7.7)
UN,Plasma: 19 mg/dL (ref 6–20)

## 2014-12-07 LAB — CBC AND DIFFERENTIAL
Baso # K/uL: 0 10*3/uL (ref 0.0–0.1)
Basophil %: 0 %
Eos # K/uL: 0 10*3/uL (ref 0.0–0.4)
Eosinophil %: 0 %
Hematocrit: 22 % — ABNORMAL LOW (ref 34–45)
Hemoglobin: 7.1 g/dL — ABNORMAL LOW (ref 11.2–15.7)
Lymph # K/uL: 6.7 10*3/uL — ABNORMAL HIGH (ref 1.2–3.7)
Lymphocyte %: 8.3 %
MCH: 39 pg/cell — ABNORMAL HIGH (ref 26–32)
MCHC: 33 g/dL (ref 32–36)
MCV: 120 fL — ABNORMAL HIGH (ref 79–95)
Mono # K/uL: 0 10*3/uL — ABNORMAL LOW (ref 0.2–0.9)
Monocyte %: 0 %
Neut # K/uL: 54.5 10*3/uL — ABNORMAL HIGH (ref 1.6–6.1)
Nucl RBC # K/uL: 0.3 10*3/uL — ABNORMAL HIGH (ref 0.0–0.0)
Nucl RBC %: 0.3 /100 WBC — ABNORMAL HIGH (ref 0.0–0.2)
Platelets: 28 10*3/uL — ABNORMAL LOW (ref 160–370)
RBC: 1.8 MIL/uL — ABNORMAL LOW (ref 3.9–5.2)
RDW: 20.4 % — ABNORMAL HIGH (ref 11.7–14.4)
Seg Neut %: 56.7 %
WBC: 83.9 10*3/uL — ABNORMAL HIGH (ref 4.0–10.0)

## 2014-12-07 LAB — DIFF MANUAL
Bands %: 8 % (ref 0–10)
Diff Based On: 120 CELLS
Metamyelocyte %: 13 % — ABNORMAL HIGH (ref 0–1)
Myelocyte %: 14 % — ABNORMAL HIGH (ref 0–0)

## 2014-12-07 LAB — LD, PL: LD, PL: 881 U/L — ABNORMAL HIGH (ref 118–225)

## 2014-12-07 LAB — NEUTROPHIL #-INSTRUMENT: Neutrophil #-Instrument: 51.6 10*3/uL

## 2014-12-07 NOTE — Telephone Encounter (Signed)
Talked to Carly Higgins about her labs, her Hct is 22%, and platlets are 28K. She is not having any bleeding. She does complain of shortness of breath and severe fatigue. She is agreeable to get 1 unit of blood.   She works at [employer] so she is going to stop by to sign consent with [name] for a blood tranfusion. I will set up a transfusion for her. Good understanding.

## 2014-12-07 NOTE — Telephone Encounter (Signed)
Let patient know that infusion is booked and as soon as I know a time and date I will let her know.   Good understanding.

## 2014-12-08 ENCOUNTER — Other Ambulatory Visit: Payer: Self-pay | Admitting: Hematology

## 2014-12-08 DIAGNOSIS — D471 Chronic myeloproliferative disease: Secondary | ICD-10-CM

## 2014-12-08 NOTE — Patient Instructions (Signed)
Carly Higgins  Jul 21, 1960  Dx: Myeloproliferative Disorder  Treatment: Decitabine 5 days out of 28 days  Labs: every 2 weeks  Antiemetic: [place], RN/[name] [phone]  Updated: 12/08/14    December 2016   Sunday Monday Tuesday Wednesday Thursday Friday Saturday                       1     2     1 unit of PRBCs    3:30 PM    3       4     5     6     FOLLOW UP [name]    2:00 PM    7  Cycle 1: Day 1   Decitabine    8:00 AM    8     Decitabine    1:30 PM    9     Decitabine    2:30 PM    10     Decitabine   11:30 AM      11     Decitabine   11:00 AM    12     13     14     15     16     17       18     19     20     21     22     23     24       25     26     27     28     29     30     08 February 2015   Sunday Monday Tuesday Wednesday Thursday Friday Saturday   1     2     3  FOLLOW UP with Dr. Liesveld@___   4  Cycle 2: Day 1   Decitabine@___   5  Decitabine@__   6  Decitabine@__   7  Decitabine@__     8  Decitabine@__   9     10     11     12     13     14       15     16     17     18     19     20     21       22     23     24     25     26     27     28       29     30     31   FOLLOW UP with Dr. Liesveld@___                                 February 2017   Sunday Monday Tuesday Wednesday Thursday Friday Saturday                   1  Cycle 3: Day 1  Decitabine@__  2  Decitabine@__    3  Decitabine@__    4  Decitabine@__      5  Decitabine@__    6     7  8     9     10     11       12     13     14     15     16     17     18       19     20     21     22     23     24     25       26     27     28                                     Decitabine (Dacogen) - given intervenously over about 1-2 hour, usually for myelodysplastic syndromes (MDS)    Possible side effects:   fatigue, headache, nausea, constipation, diarrhea, cough, high blood sugar, fever, or mouth sores.    This medicine may cause bone marrow suppression (low blood counts):   A decrease in white blood cells     increases risk for infections   signs of infection: fever, chills, cough, sore throat, pain or difficulty passing urine    A decrease in red blood cells   causes weakness, feeling tired (fatigued), fainting spells, dizziness or lightheadedness, shortness of breath, or heart palpitations   A decrease in platelets    may cause bleeding, bruising, pinpoint red spots on the skin, nose bleeds, vaginal bleeding, bloody or black, tarry stools, or blood in the urine

## 2014-12-08 NOTE — Telephone Encounter (Signed)
Informed patient that she will get 1 unit of blood 12/2 at 3:30pm and to go the the lab to   Get a type and screen drawn before her transfusion. Good understanding.

## 2014-12-09 ENCOUNTER — Telehealth: Payer: Self-pay | Admitting: Hematology

## 2014-12-09 ENCOUNTER — Other Ambulatory Visit: Payer: Self-pay | Admitting: Oncology

## 2014-12-09 NOTE — Telephone Encounter (Signed)
I spoke with the patient.  She was confirming that if she has labs drawn tomorrow morning that they will be processed in enough time prior to her infusion appointment time tomorrow afternoon.  I stated that that would leave [place] plenty of time to process the lab work.  She verbalized understanding.

## 2014-12-10 ENCOUNTER — Ambulatory Visit
Admission: RE | Admit: 2014-12-10 | Discharge: 2014-12-10 | Disposition: A | Discharge status: Home / Self Care | Payer: Self-pay | Source: Ambulatory Visit | Attending: Hematology | Admitting: Hematology

## 2014-12-10 ENCOUNTER — Ambulatory Visit: Payer: Self-pay

## 2014-12-10 ENCOUNTER — Telehealth: Payer: Self-pay | Admitting: Hematology

## 2014-12-10 VITALS — BP 98/51 | HR 76 | Temp 97.0°F | Resp 18

## 2014-12-10 DIAGNOSIS — D471 Chronic myeloproliferative disease: Secondary | ICD-10-CM

## 2014-12-10 LAB — TYPE AND SCREEN
ABO RH Blood Type: A POS
Antibody Screen: NEGATIVE

## 2014-12-10 MED ORDER — ACETAMINOPHEN 325 MG PO TABS *I*
650.0000 mg | ORAL_TABLET | Freq: Once | ORAL | Status: AC
Start: 2014-12-10 — End: 2014-12-10

## 2014-12-10 MED ORDER — ACETAMINOPHEN 325 MG PO TABS *I*
ORAL_TABLET | ORAL | Status: AC
Start: 2014-12-10 — End: 2014-12-10
  Administered 2014-12-10: 650 mg via ORAL
  Filled 2014-12-10: qty 2

## 2014-12-10 NOTE — Telephone Encounter (Signed)
Talked to Carly Higgins, informed her of taking hydroxyurea 1500 mg per day until she starts the decitabine.   Times were given her her decitabine. Good understanding.

## 2014-12-10 NOTE — Progress Notes (Signed)
Pt finished Blood transfusion, tolerated well  PIV removed, pt discharged to home

## 2014-12-10 NOTE — Progress Notes (Signed)
Vitals:    12/10/14 1619   BP: (!) 101/49   Pulse: 74   Resp: 18   Temp: 36.5 C (97.7 F)         Lab results: 12/07/14  0704   WBC 83.9*   HEMOGLOBIN 7.1*   HEMATOCRIT 22*   RBC 1.8*   PLATELETS 28*       Consent in pt's chart yes  Pt id verified and blood product checked at bedside by 2 RN's yes  Pt premedicated yes  Infused via PIV    Line flushed with good blood return before infusion  PT received one unit of PRBC  Pt tolerated transfusion well    Report given to [place].

## 2014-12-12 LAB — RED BLOOD CELLS
Coded Blood type: 6200
Component blood type: A POS
Dispense status: TRANSFUSED

## 2014-12-13 ENCOUNTER — Telehealth: Payer: Self-pay | Admitting: Hematology

## 2014-12-13 LAB — BB PROTOCOL REVIEW

## 2014-12-13 NOTE — Telephone Encounter (Signed)
Advised patient to have blood drawn before appointment tomorrow.  Patient expressed interest in of medi-port - advised her to discuss with provider at appointment.

## 2014-12-14 ENCOUNTER — Telehealth: Payer: Self-pay | Admitting: Hematology

## 2014-12-14 ENCOUNTER — Telehealth: Payer: Self-pay

## 2014-12-14 ENCOUNTER — Ambulatory Visit: Payer: Self-pay | Admitting: Oncology

## 2014-12-14 DIAGNOSIS — D72829 Elevated white blood cell count, unspecified: Secondary | ICD-10-CM

## 2014-12-14 DIAGNOSIS — D471 Chronic myeloproliferative disease: Secondary | ICD-10-CM

## 2014-12-14 LAB — COMPREHENSIVE METABOLIC PANEL, PL
ALT, PL: 17 U/L (ref 0–35)
AST, PL: 32 U/L (ref 0–35)
Albumin, PL: 4.3 g/dL (ref 3.5–5.2)
Alk Phos, PL: 61 U/L (ref 35–105)
Anion Gap,PL: 13 (ref 7–16)
Bilirubin Total, PL: 0.3 mg/dL (ref 0.0–1.2)
CO2,Plasma: 23 mmol/L (ref 20–28)
Calcium, PL: 8.9 mg/dL (ref 8.6–10.2)
Chloride,Plasma: 106 mmol/L (ref 96–108)
Creatinine: 0.92 mg/dL (ref 0.51–0.95)
GFR,Black: 81 *
GFR,Caucasian: 71 *
Glucose,Plasma: 125 mg/dL — ABNORMAL HIGH (ref 60–99)
Potassium,Plasma: 4.5 mmol/L (ref 3.4–4.7)
Sodium,Plasma: 142 mmol/L (ref 133–145)
Total Protein, PL: 7 g/dL (ref 6.3–7.7)
UN,Plasma: 19 mg/dL (ref 6–20)

## 2014-12-14 LAB — CBC AND DIFFERENTIAL
Baso # K/uL: 0 10*3/uL (ref 0.0–0.1)
Basophil %: 0 %
Eos # K/uL: 0 10*3/uL (ref 0.0–0.4)
Eosinophil %: 0 %
Hematocrit: 23 % — ABNORMAL LOW (ref 34–45)
Hemoglobin: 7.5 g/dL — ABNORMAL LOW (ref 11.2–15.7)
Lymph # K/uL: 3.5 10*3/uL (ref 1.2–3.7)
Lymphocyte %: 4 %
MCH: 37 pg/cell — ABNORMAL HIGH (ref 26–32)
MCHC: 33 g/dL (ref 32–36)
MCV: 114 fL — ABNORMAL HIGH (ref 79–95)
Mono # K/uL: 0 10*3/uL — ABNORMAL LOW (ref 0.2–0.9)
Monocyte %: 0 %
Neut # K/uL: 61.4 10*3/uL — ABNORMAL HIGH (ref 1.6–6.1)
Nucl RBC # K/uL: 0.4 10*3/uL — ABNORMAL HIGH (ref 0.0–0.0)
Nucl RBC %: 0.4 /100 WBC — ABNORMAL HIGH (ref 0.0–0.2)
Platelets: 23 10*3/uL — ABNORMAL LOW (ref 160–370)
RBC: 2 MIL/uL — ABNORMAL LOW (ref 3.9–5.2)
RDW: 22.4 % — ABNORMAL HIGH (ref 11.7–14.4)
Seg Neut %: 56 %
WBC: 87.7 10*3/uL — ABNORMAL HIGH (ref 4.0–10.0)

## 2014-12-14 LAB — DIFF MANUAL
Bands %: 14 % (ref 0–10)
Blasts %: 3 % — ABNORMAL HIGH (ref 0–0)
Diff Based On: 125 CELLS
Metamyelocyte %: 10 % — ABNORMAL HIGH (ref 0–1)
Myelocyte %: 13 % — ABNORMAL HIGH (ref 0–0)

## 2014-12-14 LAB — RBC MORPHOLOGY

## 2014-12-14 LAB — NEUTROPHIL #-INSTRUMENT: Neutrophil #-Instrument: 54.1 10*3/uL

## 2014-12-14 LAB — LD, PL: LD, PL: 936 U/L — ABNORMAL HIGH (ref 118–225)

## 2014-12-14 LAB — TYPE AND SCREEN
ABO RH Blood Type: A POS
Antibody Screen: NEGATIVE

## 2014-12-14 LAB — BLOOD BANK TRANSFUSION

## 2014-12-14 NOTE — Patient Instructions (Addendum)
Carly Higgins  [birth year]  Dx: Myeloproliferative Disorder  Treatment: Decitabine 5 days out of 28 days  Labs: every 2 weeks  Antiemetic: [place], RN/[name] [phone]  Updated: 12/08/14    December 2016   Sunday Monday Tuesday Wednesday Thursday Friday Saturday                       1     2     1 unit of PRBCs    3:30 PM    3       4     5     6     FOLLOW UP [name]    2:00 PM    7  Cycle 1: Day 1   Decitabine    8:00 AM    8     Decitabine    1:30 PM    9     Decitabine    2:30 PM    10     Decitabine   11:30 AM      11     Decitabine   11:00 AM    12     13     14     15     16     17       18     19     20     21     22     23     24       25     26     27     28     29     30     08 February 2015   Sunday Monday Tuesday Wednesday Thursday Friday Saturday   1     2     3  FOLLOW UP with Dr. Liesveld@ 2:45PM   4  Cycle 2: Day 1   Decitabine@  2:30pm   5  Decitabine@  2:30pm__   6  Decitabine@  2:30pm   7  Decitabine@  12:30pm     8  Decitabine@ 12:00pm   9     10     11     12     13     14       15     16     17     18     19     20     21       22     23     24     25     26     27     28       29     30     31   FOLLOW UP with [name] 2:45PM                                February 2017   Sunday Monday Tuesday Wednesday Thursday Friday Saturday                   1  Cycle 3: Day 1  Decitabine@  2:30pm 2  Decitabine@  3:30pm   3  Decitabine@  2:30pm   4  Decitabine@ 1:00pm  5  Decitabine@ 1:00pm 6     7     8     9     10     11       12     13     14     15     16     17     18       19     20     21     22     23     24     25       26     27     28                                     Decitabine (Dacogen) - given intervenously over about 1-2 hour, usually for myelodysplastic syndromes (MDS)    Possible side effects:   fatigue, headache, nausea, constipation, diarrhea, cough, high blood sugar, fever, or mouth sores.    This medicine may cause bone marrow suppression  (low blood counts):   A decrease in white blood cells    increases risk for infections   signs of infection: fever, chills, cough, sore throat, pain or difficulty passing urine    A decrease in red blood cells   causes weakness, feeling tired (fatigued), fainting spells, dizziness or lightheadedness, shortness of breath, or heart palpitations   A decrease in platelets    may cause bleeding, bruising, pinpoint red spots on the skin, nose bleeds, vaginal bleeding, bloody or black, tarry stools, or blood in the urine

## 2014-12-14 NOTE — Telephone Encounter (Signed)
Critical lab value called in: 0821     Name of provider: J/ Liesveld MD/ K. Likly NP     Time provider notified:  0825     Time provider responded:  0830     Critical lab and value: Bands 14     Action taken as result of notification:  Provider notified and informed

## 2014-12-14 NOTE — Telephone Encounter (Signed)
Informed patient in clinic that her Hct is 23% and platelets are 28K. She will get 1 unit of blood tomorrow with her decitabine.   Good understanding.

## 2014-12-15 ENCOUNTER — Ambulatory Visit: Payer: Self-pay

## 2014-12-15 ENCOUNTER — Telehealth: Payer: Self-pay

## 2014-12-15 VITALS — BP 118/59 | HR 72 | Temp 98.6°F | Resp 16 | Wt 262.2 lb

## 2014-12-15 DIAGNOSIS — D471 Chronic myeloproliferative disease: Secondary | ICD-10-CM

## 2014-12-15 MED ORDER — SODIUM CHLORIDE 0.9 % IV SOLN WRAPPED *I*
20.0000 mg/m2 | Freq: Once | INTRAVENOUS | Status: AC
Start: 2014-12-15 — End: 2014-12-15
  Administered 2014-12-15: 47 mg via INTRAVENOUS
  Filled 2014-12-15: qty 9.4

## 2014-12-15 MED ORDER — GRANISETRON HCL 1 MG PO TABS *I*
1.0000 mg | ORAL_TABLET | Freq: Once | ORAL | Status: AC
Start: 2014-12-15 — End: 2014-12-15

## 2014-12-15 MED ORDER — ALLOPURINOL 300 MG PO TABS *I*
300.0000 mg | ORAL_TABLET | Freq: Every day | ORAL | 0 refills | Status: DC
Start: 2014-12-15 — End: 2015-01-12

## 2014-12-15 MED ORDER — SODIUM CHLORIDE 0.9 % IV SOLN WRAPPED *I*
20.0000 mg/m2 | Freq: Once | INTRAVENOUS | Status: DC
Start: 2014-12-15 — End: 2014-12-15

## 2014-12-15 MED ORDER — SODIUM CHLORIDE 0.9 % IV SOLN WRAPPED *I*
30.0000 mL/h | Status: DC | PRN
Start: 2014-12-15 — End: 2014-12-15
  Administered 2014-12-15: 30 mL/h via INTRAVENOUS

## 2014-12-15 MED ORDER — ACETAMINOPHEN 325 MG PO TABS *I*
ORAL_TABLET | ORAL | Status: AC
Start: 2014-12-15 — End: 2014-12-15
  Administered 2014-12-15: 650 mg via ORAL
  Filled 2014-12-15: qty 2

## 2014-12-15 MED ORDER — GRANISETRON HCL 1 MG PO TABS *I*
ORAL_TABLET | ORAL | Status: AC
Start: 2014-12-15 — End: 2014-12-15
  Administered 2014-12-15: 1 mg via ORAL
  Filled 2014-12-15: qty 1

## 2014-12-15 MED ORDER — ACETAMINOPHEN 325 MG PO TABS *I*
650.0000 mg | ORAL_TABLET | Freq: Once | ORAL | Status: AC
Start: 2014-12-15 — End: 2014-12-15

## 2014-12-15 NOTE — Progress Notes (Signed)
hct 23 and received and tolerated 1 unit PRBC via piv  and given and tolerated  C1D1 Decitabine, current consent in chart   and remains heplocked for return visit on 112/08/16

## 2014-12-15 NOTE — Telephone Encounter (Signed)
Informed patient to take allopurinol for 10 days. I explained the reason and answered Carly Higgins's questions.   Good understanding.

## 2014-12-16 ENCOUNTER — Ambulatory Visit: Payer: Self-pay

## 2014-12-16 VITALS — BP 149/69 | HR 95 | Temp 96.4°F | Resp 18 | Wt 263.3 lb

## 2014-12-16 DIAGNOSIS — D471 Chronic myeloproliferative disease: Secondary | ICD-10-CM

## 2014-12-16 LAB — RED BLOOD CELLS
Coded Blood type: 6200
Component blood type: A POS
Dispense status: TRANSFUSED

## 2014-12-16 MED ORDER — GRANISETRON HCL 1 MG PO TABS *I*
ORAL_TABLET | ORAL | Status: DC
Start: 2014-12-16 — End: 2014-12-16
  Filled 2014-12-16: qty 1

## 2014-12-16 MED ORDER — GRANISETRON HCL 1 MG PO TABS *I*
1.0000 mg | ORAL_TABLET | Freq: Once | ORAL | Status: AC
Start: 2014-12-16 — End: 2014-12-16
  Administered 2014-12-16: 1 mg via ORAL

## 2014-12-16 MED ORDER — SODIUM CHLORIDE 0.9 % IV SOLN WRAPPED *I*
30.0000 mL/h | Status: DC | PRN
Start: 2014-12-16 — End: 2014-12-16

## 2014-12-16 MED ORDER — SODIUM CHLORIDE 0.9 % IV SOLN WRAPPED *I*
20.0000 mg/m2 | Freq: Once | INTRAVENOUS | Status: AC
Start: 2014-12-16 — End: 2014-12-16
  Administered 2014-12-16: 47 mg via INTRAVENOUS
  Filled 2014-12-16: qty 9.4

## 2014-12-16 NOTE — Progress Notes (Signed)
Pt received decitabine today in infusion center. Pt tolerated treatment well. PIV previously inserted from 12/7,   PIV remains in place for treatment on 12/9. IV extension approved. Assessment per flow sheet. Pt discharged home independently

## 2014-12-16 NOTE — Progress Notes (Signed)
Subjective:      Patient ID: Carly Higgins is a 54 y.o. female Carly Higgins is a 54 y.o. 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 By [employer] testing, she has mutations of TET@, ASCL1, CCND2, GATA2, SRSF2.     HPI  Carly Higgins comes to the office today to discuss starting Decitabine therapy(she has already discussed this in detail with [name])  and to sign the consent form.  Overall she feels well with the exception of fatigue.  She continues to work full time at the [employer].  No fevers or infections.  No issues with bleeding.  Carly Higgins does not have any specific questions about Decitabine.  She is just generally nervous to start treatment.      Review of Systems   All other systems reviewed and are negative.    Objective:     Vitals:    12/14/14 1411   BP: 156/66   Pulse: 96   Resp: 18   Temp: 36.1 C (97 F)   Weight: 119.3 kg (262 lb 14.4 oz)   Height: 167 cm (5' 5.75")     Laboratory Data:  Results for Carly Higgins, Carly Higgins (MRN [id number]) as of 12/16/2014 10:45   Ref. Range 11/23/2014 07:05 12/07/2014 07:04 12/14/2014 07:09   WBC Latest Ref Range: 4.0 - 10.0 THOU/uL 67.3 (H) 83.9 (H) 87.7 (H)   RBC Latest Ref Range: 3.9 - 5.2 MIL/uL 2.1 (L) 1.8 (L) 2.0 (L)   Hemoglobin Latest Ref Range: 11.2 - 15.7 g/dL 8.0 (L) 7.1 (L) 7.5 (L)   Hematocrit Latest Ref Range: 34 - 45 % 24 (L) 22 (L) 23 (L)   MCV Latest Ref Range: 79 - 95 fL 115 (H) 120 (H) 114 (H)   MCH Latest Ref Range: 26 - 32 pg/cell 38 (H) 39 (H) 37 (H)   MCHC Latest Ref Range: 32 - 36 g/dL 33 33 33   RDW Latest Ref Range: 11.7 - 14.4 % 19.6 (H) 20.4 (H) 22.4 (H)   Platelets Latest Ref Range: 160 - 370 THOU/uL 32 (L) 28 (L) 23 (L)   Manual DIFF Unknown RESULTS RESULTS RESULTS   Neut # K/uL Latest Ref Range: 1.6 - 6.1 THOU/uL 51.8 (H) 54.5 (H) 61.4 (H)   Neutrophil #-Instrument Latest Units: THOU/uL 42.4 51.6 54.1   Lymph # K/uL Latest Ref Range: 1.2 - 3.7 THOU/uL 2.7 6.7 (H) 3.5    Mono # K/uL Latest Ref Range: 0.2 - 0.9 THOU/uL 0.0 (L) 0.0 (L) 0.0 (L)   Eos # K/uL Latest Ref Range: 0.0 - 0.4 THOU/uL 0.5 (H) 0.0 0.0   Baso # K/uL Latest Ref Range: 0.0 - 0.1 THOU/uL 0.0 0.0 0.0   Nucl RBC # K/uL Latest Ref Range: 0.0 - 0.0 THOU/uL 0.2 (H) 0.3 (H) 0.4 (H)   Seg Neut % Latest Units: % 67.1 56.7 56.0   Lymphocyte % Latest Units: % 2.5 8.3 4.0   Monocyte % Latest Units: % 0.0 0.0 0.0   Eosinophil % Latest Units: % 0.8 0.0 0.0   Basophil % Latest Units: % 0.0 0.0 0.0   Nucl RBC % Latest Ref Range: 0.0 - 0.2 /100 WBC 0.3 (H) 0.3 (H) 0.4 (H)   React Lymph % Latest Ref Range: 0 - 6 % 1     Bands % Latest Ref Range: 0 - 10 % _0 (HH)   Metamyelocyte % Latest Ref Range: 0 - 1 % 5 (H)  13 (H) 10 (H)   Myelocyte % Latest Ref Range: 0 - 0 % 11 (H) 14 (H) 13 (H)   Promyelocyte % Latest Ref Range: 0 - 0 % 3 (H)     Blasts % Latest Ref Range: 0 - 0 %   3 (H)   Diff Based On Latest Units: CELLS 118 120 125   RBC Morphology Unknown   RESULTS   Macrocytes Unknown   Present (A)   Polychromasia Unknown   Present (A)   Stomatocytes Unknown   Present (A)     Results for Carly Higgins, Carly Higgins (MRN [id number]) as of 12/16/2014 10:45   Ref. Range 12/14/2014 07:09   Sodium,Plasma Latest Ref Range: 133 - 145 mmol/L 142   Potassium,Plasma Latest Ref Range: 3.4 - 4.7 mmol/L 4.5   Chloride,Plasma Latest Ref Range: 96 - 108 mmol/L 106   CO2,Plasma Latest Ref Range: 20 - 28 mmol/L 23   Anion Gap,PL Latest Ref Range: 7 - 16  13   UN,Plasma Latest Ref Range: 6 - 20 mg/dL 19   Creatinine Latest Ref Range: 0.51 - 0.95 mg/dL 0.92   GFR,Black Latest Units: * 81   GFR,Caucasian Latest Units: * 71   Glucose,Plasma Latest Ref Range: 60 - 99 mg/dL 125 (H)   Calcium, PL Latest Ref Range: 8.6 - 10.2 mg/dL 8.9   Total Protein, PL Latest Ref Range: 6.3 - 7.7 g/dL 7.0   Albumin, PL Latest Ref Range: 3.5 - 5.2 g/dL 4.3   LD, PL Latest Ref Range: 118 - 225 U/L 936 (H)   ALT, PL Latest Ref Range: 0 - 35 U/L 17   AST, PL Latest Ref Range: 0 - 35 U/L  32   Alk Phos, PL Latest Ref Range: 35 - 105 U/L 61   Bilirubin Total, PL Latest Ref Range: 0.0 - 1.2 mg/dL 0.3     Physical Exam   Constitutional: She is oriented to person, place, and time. She appears well-developed and well-nourished.   HENT:   Mouth/Throat: Oropharynx is clear and moist. No oropharyngeal exudate.   Eyes: No scleral icterus.   Cardiovascular: Normal rate and regular rhythm.    Pulmonary/Chest: Effort normal and breath sounds normal.   Neurological: She is alert and oriented to person, place, and time.   Skin: Skin is warm and dry.     Assessment:     Ms. Agent is a 54 year old female with unclassified myeloproliferative disorder.  Given progressive leukocytosis, anemia and thrombocytopenia we have recommended starting Decitabine.  Therapy was reviewed with the patient and all questions were answered to her apparent satisfaction.  She is agreeable to proceed.    She has a brother and sister who have not been HLA typed per Meiah's request. She has several potential donors in the unrelated donor registry.     Plan:     1.  Will begin Decitabine 20 mg/m2 IV daily x 5 days on 12/15/14  --she is aware to stop taking Hydroxyurea  --compazine prn prescribed  --allopurinol x 10 days given elevated WBC and LDH  --will follow labs weekly and support as indicated    2.  Follow up in 1 month      Jasmine Awe RN,MS  Nurse Practitioner

## 2014-12-17 ENCOUNTER — Ambulatory Visit: Payer: Self-pay

## 2014-12-17 VITALS — BP 133/61 | HR 76 | Temp 97.3°F | Resp 18 | Wt 265.7 lb

## 2014-12-17 DIAGNOSIS — D471 Chronic myeloproliferative disease: Secondary | ICD-10-CM

## 2014-12-17 MED ORDER — GRANISETRON HCL 1 MG PO TABS *I*
1.0000 mg | ORAL_TABLET | Freq: Once | ORAL | Status: AC
Start: 2014-12-17 — End: 2014-12-17
  Administered 2014-12-17: 1 mg via ORAL

## 2014-12-17 MED ORDER — SODIUM CHLORIDE 0.9 % IV SOLN WRAPPED *I*
30.0000 mL/h | Status: DC | PRN
Start: 2014-12-17 — End: 2014-12-17
  Administered 2014-12-17: 30 mL/h via INTRAVENOUS

## 2014-12-17 MED ORDER — SODIUM CHLORIDE 0.9 % IV SOLN WRAPPED *I*
20.0000 mg/m2 | Freq: Once | INTRAVENOUS | Status: AC
Start: 2014-12-17 — End: 2014-12-17
  Administered 2014-12-17: 47 mg via INTRAVENOUS
  Filled 2014-12-17: qty 9.4

## 2014-12-17 MED ORDER — GRANISETRON HCL 1 MG PO TABS *I*
ORAL_TABLET | ORAL | Status: DC
Start: 2014-12-17 — End: 2014-12-17
  Filled 2014-12-17: qty 1

## 2014-12-17 NOTE — Progress Notes (Signed)
Patient here for IV Decitabine infusion, tolerated well, no complaint. Positive blood return from PIV before and after infusion. AVS provided, next appointment set, discharged to home.

## 2014-12-18 ENCOUNTER — Ambulatory Visit: Payer: Self-pay

## 2014-12-18 VITALS — BP 134/63 | HR 79 | Temp 97.7°F | Resp 18 | Wt 263.4 lb

## 2014-12-18 DIAGNOSIS — D471 Chronic myeloproliferative disease: Secondary | ICD-10-CM

## 2014-12-18 MED ORDER — SODIUM CHLORIDE 0.9 % IV SOLN WRAPPED *I*
30.0000 mL/h | Status: DC | PRN
Start: 2014-12-18 — End: 2014-12-18
  Administered 2014-12-18: 30 mL/h via INTRAVENOUS

## 2014-12-18 MED ORDER — GRANISETRON HCL 1 MG PO TABS *I*
ORAL_TABLET | ORAL | Status: DC
Start: 2014-12-18 — End: 2014-12-18
  Filled 2014-12-18: qty 1

## 2014-12-18 MED ORDER — SODIUM CHLORIDE 0.9 % IV SOLN WRAPPED *I*
20.0000 mg/m2 | Freq: Once | INTRAVENOUS | Status: AC
Start: 2014-12-18 — End: 2014-12-18
  Administered 2014-12-18: 47 mg via INTRAVENOUS
  Filled 2014-12-18: qty 9.4

## 2014-12-18 MED ORDER — GRANISETRON HCL 1 MG PO TABS *I*
1.0000 mg | ORAL_TABLET | Freq: Once | ORAL | Status: AC
Start: 2014-12-18 — End: 2014-12-18
  Administered 2014-12-18: 1 mg via ORAL

## 2014-12-18 NOTE — Progress Notes (Signed)
Patient here for Decitabine, piv in place. Blood return noted from piv prior to chemotherapy, blood return noted post chemotherapy as well. Next appointment set for tomorrow.

## 2014-12-19 ENCOUNTER — Ambulatory Visit: Payer: Self-pay

## 2014-12-19 VITALS — BP 150/75 | HR 86 | Temp 97.7°F | Resp 17 | Ht 65.75 in | Wt 265.2 lb

## 2014-12-19 DIAGNOSIS — D471 Chronic myeloproliferative disease: Secondary | ICD-10-CM

## 2014-12-19 MED ORDER — GRANISETRON HCL 1 MG PO TABS *I*
1.0000 mg | ORAL_TABLET | Freq: Once | ORAL | Status: DC
Start: 2014-12-19 — End: 2014-12-19

## 2014-12-19 MED ORDER — SODIUM CHLORIDE 0.9 % IV SOLN WRAPPED *I*
20.0000 mg/m2 | Freq: Once | INTRAVENOUS | Status: AC
Start: 2014-12-19 — End: 2014-12-19
  Administered 2014-12-19: 47 mg via INTRAVENOUS
  Filled 2014-12-19: qty 9.4

## 2014-12-19 MED ORDER — GRANISETRON HCL 1 MG PO TABS *I*
ORAL_TABLET | ORAL | Status: DC
Start: 2014-12-19 — End: 2014-12-19
  Filled 2014-12-19: qty 1

## 2014-12-19 MED ORDER — SODIUM CHLORIDE 0.9 % IV SOLN WRAPPED *I*
30.0000 mL/h | Status: DC | PRN
Start: 2014-12-19 — End: 2014-12-19

## 2014-12-19 NOTE — Progress Notes (Signed)
Pt arrived stable.  Feeling ok today.  Tolerated decitabine via piv  Blood return noted pre and post infusion.  Next appointment scheduled.  Encouraged to call PRN.  Stable upon d/c.

## 2014-12-20 ENCOUNTER — Ambulatory Visit
Admission: RE | Admit: 2014-12-20 | Discharge: 2014-12-20 | Disposition: A | Discharge status: Home / Self Care | Payer: Self-pay | Source: Ambulatory Visit | Attending: Hematology | Admitting: Hematology

## 2014-12-20 ENCOUNTER — Telehealth: Payer: Self-pay | Admitting: Hematology

## 2014-12-20 ENCOUNTER — Telehealth: Payer: Self-pay

## 2014-12-20 DIAGNOSIS — D471 Chronic myeloproliferative disease: Secondary | ICD-10-CM

## 2014-12-20 DIAGNOSIS — D72829 Elevated white blood cell count, unspecified: Secondary | ICD-10-CM

## 2014-12-20 LAB — COMPREHENSIVE METABOLIC PANEL, PL
ALT, PL: 20 U/L (ref 0–35)
AST, PL: 30 U/L (ref 0–35)
Albumin, PL: 4.2 g/dL (ref 3.5–5.2)
Alk Phos, PL: 59 U/L (ref 35–105)
Anion Gap,PL: 12 (ref 7–16)
Bilirubin Total, PL: 0.4 mg/dL (ref 0.0–1.2)
CO2,Plasma: 26 mmol/L (ref 20–28)
Calcium, PL: 9.4 mg/dL (ref 8.6–10.2)
Chloride,Plasma: 104 mmol/L (ref 96–108)
Creatinine: 0.82 mg/dL (ref 0.51–0.95)
GFR,Black: 93 *
GFR,Caucasian: 81 *
Glucose,Plasma: 110 mg/dL — ABNORMAL HIGH (ref 60–99)
Potassium,Plasma: 4.8 mmol/L — ABNORMAL HIGH (ref 3.4–4.7)
Sodium,Plasma: 142 mmol/L (ref 133–145)
Total Protein, PL: 6.9 g/dL (ref 6.3–7.7)
UN,Plasma: 21 mg/dL — ABNORMAL HIGH (ref 6–20)

## 2014-12-20 LAB — DIFF MANUAL
Bands %: 10 % (ref 0–10)
Blasts %: 1 % — ABNORMAL HIGH (ref 0–0)
Diff Based On: 128 CELLS
Metamyelocyte %: 10 % — ABNORMAL HIGH (ref 0–1)
Myelocyte %: 16 % — ABNORMAL HIGH (ref 0–0)
Promyelocyte %: 1 % — ABNORMAL HIGH (ref 0–0)

## 2014-12-20 LAB — CBC AND DIFFERENTIAL
Baso # K/uL: 0 10*3/uL (ref 0.0–0.1)
Basophil %: 0 %
Eos # K/uL: 0 10*3/uL (ref 0.0–0.4)
Eosinophil %: 0 %
Hematocrit: 23 % — ABNORMAL LOW (ref 34–45)
Hemoglobin: 7.4 g/dL — ABNORMAL LOW (ref 11.2–15.7)
Lymph # K/uL: 3.8 10*3/uL — ABNORMAL HIGH (ref 1.2–3.7)
Lymphocyte %: 3.9 %
MCH: 36 pg/cell — ABNORMAL HIGH (ref 26–32)
MCHC: 33 g/dL (ref 32–36)
MCV: 111 fL — ABNORMAL HIGH (ref 79–95)
Mono # K/uL: 0.8 10*3/uL (ref 0.2–0.9)
Monocyte %: 0.8 %
Neut # K/uL: 64.1 10*3/uL — ABNORMAL HIGH (ref 1.6–6.1)
Nucl RBC # K/uL: 0.1 10*3/uL — ABNORMAL HIGH (ref 0.0–0.0)
Nucl RBC %: 0.1 /100 WBC (ref 0.0–0.2)
Platelets: 22 10*3/uL — ABNORMAL LOW (ref 160–370)
RBC: 2 MIL/uL — ABNORMAL LOW (ref 3.9–5.2)
RDW: 22.5 % — ABNORMAL HIGH (ref 11.7–14.4)
Seg Neut %: 57.7 %
WBC: 94.3 10*3/uL — ABNORMAL HIGH (ref 4.0–10.0)

## 2014-12-20 LAB — NEUTROPHIL #-INSTRUMENT: Neutrophil #-Instrument: 58.7 10*3/uL

## 2014-12-20 LAB — LD, PL: LD, PL: 908 U/L — ABNORMAL HIGH (ref 118–225)

## 2014-12-20 LAB — TYPE AND SCREEN
ABO RH Blood Type: A POS
Antibody Screen: NEGATIVE

## 2014-12-20 NOTE — Telephone Encounter (Signed)
Called Carly Higgins - all set for tomorrow

## 2014-12-20 NOTE — Telephone Encounter (Signed)
Talked to Carly Higgins, reassured her that we might not see her counts go down this fast. She just had last day of decitabine yesterday.  Good understanding.

## 2014-12-21 ENCOUNTER — Ambulatory Visit: Payer: Self-pay | Admitting: Hematology

## 2014-12-21 ENCOUNTER — Ambulatory Visit: Payer: Self-pay

## 2014-12-21 ENCOUNTER — Telehealth: Payer: Self-pay | Admitting: Hematology

## 2014-12-21 VITALS — BP 140/75 | HR 74 | Temp 96.8°F | Resp 16 | Wt 270.0 lb

## 2014-12-21 DIAGNOSIS — D471 Chronic myeloproliferative disease: Secondary | ICD-10-CM

## 2014-12-21 MED ORDER — SODIUM CHLORIDE 0.9 % IV SOLN WRAPPED *I*
30.0000 mL/h | Status: DC | PRN
Start: 2014-12-21 — End: 2014-12-21

## 2014-12-21 MED ORDER — ACETAMINOPHEN 325 MG PO TABS *I*
ORAL_TABLET | ORAL | Status: AC
Start: 2014-12-21 — End: 2014-12-21
  Administered 2014-12-21: 650 mg via ORAL
  Filled 2014-12-21: qty 2

## 2014-12-21 MED ORDER — ACETAMINOPHEN 325 MG PO TABS *I*
650.0000 mg | ORAL_TABLET | Freq: Once | ORAL | Status: AC
Start: 2014-12-21 — End: 2014-12-21

## 2014-12-21 NOTE — Progress Notes (Signed)
Pt arrived stable.  Feeling well today.  Hct= 23 Tolerated 1 units of blood via piv.  Enc to call PRN.  Stable upon d/c.

## 2014-12-21 NOTE — Telephone Encounter (Signed)
Talked to Carly Higgins about her decitabine treatment. She is concerned about her lab work and her wbc going higher. I reassured her that her last treatment was Sunday and it might take a week or two to see results. I told her to get lab work on Monday. She is also having trouble with constipation which I educated her about taking colace, change in diet and hydrating will help. I encouraged her to call back if she is still having constipation. Good understanding.

## 2014-12-22 ENCOUNTER — Telehealth: Payer: Self-pay | Admitting: Hematology

## 2014-12-22 NOTE — Telephone Encounter (Signed)
Spoke with Genn[place] today regarding her questions.    I reminded her:    1.  That we would continue her treatment ongoing as long as she is tolerating it and responding to the treatment  2.  That it might take a few cycles before we see her counts start to improve  3.  That she could have an occasional alcoholic beverage    She was thankful for the call back and voiced her understanding.

## 2014-12-22 NOTE — Telephone Encounter (Signed)
Pt is wondering if decitabine does not work- what is her next step?  What are the most treatments that can be done (as far as decitibine).    Would like to know if she an have an alcoholic beverage while not getting chemo (in weeks off between cycles).

## 2014-12-23 LAB — RED BLOOD CELLS
Coded Blood type: 6200
Component blood type: A POS
Dispense status: TRANSFUSED

## 2014-12-27 ENCOUNTER — Ambulatory Visit
Admission: RE | Admit: 2014-12-27 | Discharge: 2014-12-27 | Disposition: A | Discharge status: Home / Self Care | Payer: Self-pay | Source: Ambulatory Visit | Attending: Hematology | Admitting: Hematology

## 2014-12-27 ENCOUNTER — Telehealth: Payer: Self-pay | Admitting: Hematology

## 2014-12-27 ENCOUNTER — Telehealth: Payer: Self-pay

## 2014-12-27 ENCOUNTER — Other Ambulatory Visit: Payer: Self-pay | Admitting: Hematology

## 2014-12-27 ENCOUNTER — Encounter: Payer: Self-pay | Admitting: Oncology

## 2014-12-27 ENCOUNTER — Ambulatory Visit: Payer: Self-pay

## 2014-12-27 ENCOUNTER — Inpatient Hospital Stay: Payer: Self-pay

## 2014-12-27 DIAGNOSIS — D471 Chronic myeloproliferative disease: Secondary | ICD-10-CM

## 2014-12-27 DIAGNOSIS — D72829 Elevated white blood cell count, unspecified: Secondary | ICD-10-CM

## 2014-12-27 LAB — CBC AND DIFFERENTIAL
Baso # K/uL: 0 10*3/uL (ref 0.0–0.1)
Basophil %: 0 %
Eos # K/uL: 0 10*3/uL (ref 0.0–0.4)
Eosinophil %: 0 %
Hematocrit: 23 % — ABNORMAL LOW (ref 34–45)
Hemoglobin: 7.9 g/dL — ABNORMAL LOW (ref 11.2–15.7)
Lymph # K/uL: 1.6 10*3/uL (ref 1.2–3.7)
Lymphocyte %: 3.1 %
MCH: 36 pg/cell — ABNORMAL HIGH (ref 26–32)
MCHC: 34 g/dL (ref 32–36)
MCV: 106 fL — ABNORMAL HIGH (ref 79–95)
Mono # K/uL: 0.8 10*3/uL (ref 0.2–0.9)
Monocyte %: 1.6 %
Neut # K/uL: 44.9 10*3/uL — ABNORMAL HIGH (ref 1.6–6.1)
Nucl RBC # K/uL: 0.1 10*3/uL — ABNORMAL HIGH (ref 0.0–0.0)
Nucl RBC %: 0.2 /100 WBC (ref 0.0–0.2)
Platelets: 15 10*3/uL — CL (ref 160–370)
RBC: 2.2 MIL/uL — ABNORMAL LOW (ref 3.9–5.2)
RDW: 20.9 % — ABNORMAL HIGH (ref 11.7–14.4)
Seg Neut %: 76.5 %
WBC: 52.2 10*3/uL — ABNORMAL HIGH (ref 4.0–10.0)

## 2014-12-27 LAB — COMPREHENSIVE METABOLIC PANEL, PL
ALT, PL: 19 U/L (ref 0–35)
AST, PL: 23 U/L (ref 0–35)
Albumin, PL: 4.4 g/dL (ref 3.5–5.2)
Alk Phos, PL: 73 U/L (ref 35–105)
Anion Gap,PL: 14 (ref 7–16)
Bilirubin Total, PL: 0.5 mg/dL (ref 0.0–1.2)
CO2,Plasma: 22 mmol/L (ref 20–28)
Calcium, PL: 9.4 mg/dL (ref 8.6–10.2)
Chloride,Plasma: 102 mmol/L (ref 96–108)
Creatinine: 0.9 mg/dL (ref 0.51–0.95)
GFR,Black: 84 *
GFR,Caucasian: 72 *
Glucose,Plasma: 121 mg/dL — ABNORMAL HIGH (ref 60–99)
Potassium,Plasma: 4.6 mmol/L (ref 3.4–4.7)
Sodium,Plasma: 138 mmol/L (ref 133–145)
Total Protein, PL: 7.3 g/dL (ref 6.3–7.7)
UN,Plasma: 27 mg/dL — ABNORMAL HIGH (ref 6–20)

## 2014-12-27 LAB — DIFF MANUAL
Bands %: 9 % (ref 0–10)
Diff Based On: 127 CELLS
Metamyelocyte %: 4 % — ABNORMAL HIGH (ref 0–1)
Myelocyte %: 6 % — ABNORMAL HIGH (ref 0–0)

## 2014-12-27 LAB — LD, PL: LD, PL: 528 U/L — ABNORMAL HIGH (ref 118–225)

## 2014-12-27 LAB — TYPE AND SCREEN
ABO RH Blood Type: A POS
Antibody Screen: NEGATIVE

## 2014-12-27 LAB — NEUTROPHIL #-INSTRUMENT: Neutrophil #-Instrument: 37.5 10*3/uL

## 2014-12-27 NOTE — Telephone Encounter (Signed)
Critical lab value called in:     Name of provider:Liesveld/Likly     Time provider notified: [phone]     Time provider responded:  0909     Critical lab and value:Platelets 15,000     Action taken as result of notification:Provider paged and aware

## 2014-12-27 NOTE — Progress Notes (Signed)
Spoke with Carly Higgins.  She is feeling well.  She is aware that her platelet count is 15,000.  No issues with bleeding or bruising.  No headaches or vision changes.  She will have her CBC re-checked on Thursday 12/30/14.

## 2014-12-28 ENCOUNTER — Ambulatory Visit: Payer: Self-pay | Admitting: Hematology

## 2014-12-28 ENCOUNTER — Inpatient Hospital Stay: Payer: Self-pay

## 2014-12-28 NOTE — Telephone Encounter (Signed)
Talked to Carly Higgins, she states she talked to [name] yesterday about her blood work. Per [name] she wants Carly Higgins to get blood work on Thursday and then a possible transfusion depending on that blood work. I let her know I would set up transfusion for Thursday and depending on her lab work would let her know on Thursday to go or not. Good understanding.

## 2014-12-28 NOTE — Telephone Encounter (Signed)
Let patient know she has possible blood and plantlet transfusion for Thursday at 10:00am. She will get blood work that day at [facility] understanding.

## 2014-12-29 ENCOUNTER — Inpatient Hospital Stay: Payer: Self-pay

## 2014-12-30 ENCOUNTER — Inpatient Hospital Stay: Payer: Self-pay

## 2014-12-30 ENCOUNTER — Telehealth: Payer: Self-pay

## 2014-12-30 ENCOUNTER — Ambulatory Visit: Payer: Self-pay | Admitting: Primary Care

## 2014-12-30 VITALS — BP 154/75 | HR 85 | Temp 98.1°F | Resp 18 | Wt 262.0 lb

## 2014-12-30 DIAGNOSIS — D72829 Elevated white blood cell count, unspecified: Secondary | ICD-10-CM

## 2014-12-30 DIAGNOSIS — D471 Chronic myeloproliferative disease: Secondary | ICD-10-CM

## 2014-12-30 LAB — COMPREHENSIVE METABOLIC PANEL, PL
ALT, PL: 21 U/L (ref 0–35)
AST, PL: 27 U/L (ref 0–35)
Albumin, PL: 4.4 g/dL (ref 3.5–5.2)
Alk Phos, PL: 64 U/L (ref 35–105)
Anion Gap,PL: 14 (ref 7–16)
Bilirubin Total, PL: 0.4 mg/dL (ref 0.0–1.2)
CO2,Plasma: 23 mmol/L (ref 20–28)
Calcium, PL: 9.3 mg/dL (ref 8.6–10.2)
Chloride,Plasma: 105 mmol/L (ref 96–108)
Creatinine: 0.8 mg/dL (ref 0.51–0.95)
GFR,Black: 96 *
GFR,Caucasian: 84 *
Glucose,Plasma: 120 mg/dL — ABNORMAL HIGH (ref 60–99)
Potassium,Plasma: 4.9 mmol/L — ABNORMAL HIGH (ref 3.4–4.7)
Sodium,Plasma: 142 mmol/L (ref 133–145)
Total Protein, PL: 7.2 g/dL (ref 6.3–7.7)
UN,Plasma: 16 mg/dL (ref 6–20)

## 2014-12-30 LAB — TYPE AND SCREEN
ABO RH Blood Type: A POS
Antibody Screen: NEGATIVE

## 2014-12-30 LAB — CBC AND DIFFERENTIAL
Baso # K/uL: 0 10*3/uL (ref 0.0–0.1)
Basophil %: 0 %
Eos # K/uL: 0.3 10*3/uL (ref 0.0–0.4)
Eosinophil %: 0.8 %
Hematocrit: 23 % — ABNORMAL LOW (ref 34–45)
Hemoglobin: 7.7 g/dL — ABNORMAL LOW (ref 11.2–15.7)
Lymph # K/uL: 2.1 10*3/uL (ref 1.2–3.7)
Lymphocyte %: 5 %
MCH: 36 pg/cell — ABNORMAL HIGH (ref 26–32)
MCHC: 34 g/dL (ref 32–36)
MCV: 105 fL — ABNORMAL HIGH (ref 79–95)
Mono # K/uL: 0 10*3/uL — ABNORMAL LOW (ref 0.2–0.9)
Monocyte %: 0 %
Neut # K/uL: 31.4 10*3/uL — ABNORMAL HIGH (ref 1.6–6.1)
Nucl RBC # K/uL: 0.6 10*3/uL — ABNORMAL HIGH (ref 0.0–0.0)
Nucl RBC %: 1.5 /100 WBC — ABNORMAL HIGH (ref 0.0–0.2)
Platelets: 18 10*3/uL — CL (ref 160–370)
RBC: 2.2 MIL/uL — ABNORMAL LOW (ref 3.9–5.2)
RDW: 21.5 % — ABNORMAL HIGH (ref 11.7–14.4)
Seg Neut %: 67.8 %
WBC: 41.8 10*3/uL — ABNORMAL HIGH (ref 4.0–10.0)

## 2014-12-30 LAB — DIFF MANUAL
Bands %: 7 % (ref 0–10)
Blasts %: 3 % — ABNORMAL HIGH (ref 0–0)
Diff Based On: 121 CELLS
Metamyelocyte %: 6 % — ABNORMAL HIGH (ref 0–1)
Myelocyte %: 10 % — ABNORMAL HIGH (ref 0–0)
Promyelocyte %: 1 % — ABNORMAL HIGH (ref 0–0)

## 2014-12-30 LAB — NEUTROPHIL #-INSTRUMENT: Neutrophil #-Instrument: 26.4 10*3/uL

## 2014-12-30 LAB — LD, PL: LD, PL: 523 U/L — ABNORMAL HIGH (ref 118–225)

## 2014-12-30 MED ORDER — ACETAMINOPHEN 325 MG PO TABS *I*
650.0000 mg | ORAL_TABLET | Freq: Once | ORAL | Status: AC
Start: 2014-12-30 — End: 2014-12-30

## 2014-12-30 MED ORDER — SODIUM CHLORIDE 0.9 % IV SOLN WRAPPED *I*
30.0000 mL/h | Status: DC | PRN
Start: 2014-12-30 — End: 2014-12-30

## 2014-12-30 MED ORDER — ACETAMINOPHEN 325 MG PO TABS *I*
ORAL_TABLET | ORAL | Status: AC
Start: 2014-12-30 — End: 2014-12-30
  Administered 2014-12-30: 650 mg via ORAL
  Filled 2014-12-30: qty 2

## 2014-12-30 NOTE — Progress Notes (Signed)
Patient arrived to clinic in stable condition to receive 1 unit PRBC's for a Hct of 23. Reports fatigue today and is hoping the transfusion will help increase her energy level. Patient tolerated transfusion well without complication. VS stable throughout. PIV provided blood return before and after treatment. Encouraged to call with questions/concerns. Discharged in stable condition, ambulatory.

## 2014-12-30 NOTE — Telephone Encounter (Signed)
Critical lab value called in: plt  Name of provider: Judithe Modest MD  Time provider notified: 0809   Time provider responded:  [phone]     Critical lab and value:  Plt 18  Hgb  7.7     Action taken as result of notification:  Provider notified and informed.  Alice informed.

## 2014-12-30 NOTE — Telephone Encounter (Signed)
Talked to [place]. Let her know that her Hct is 23% and her platelets are 18K. She will get 1 unit of blood today. She complains of being very fatigued for the last day. Good understanding.

## 2014-12-31 ENCOUNTER — Inpatient Hospital Stay: Payer: Self-pay

## 2014-12-31 ENCOUNTER — Ambulatory Visit
Admission: RE | Admit: 2014-12-31 | Discharge: 2014-12-31 | Disposition: A | Discharge status: Home / Self Care | Payer: Self-pay | Source: Ambulatory Visit | Attending: Internal Medicine | Admitting: Internal Medicine

## 2014-12-31 LAB — CBC AND DIFFERENTIAL
Baso # K/uL: 0 10*3/uL (ref 0.0–0.1)
Basophil %: 0 %
Eos # K/uL: 0 10*3/uL (ref 0.0–0.4)
Eosinophil %: 0 %
Hematocrit: 25 % — ABNORMAL LOW (ref 34–45)
Hemoglobin: 8.3 g/dL — ABNORMAL LOW (ref 11.2–15.7)
Lymph # K/uL: 3 10*3/uL (ref 1.2–3.7)
Lymphocyte %: 8 %
MCH: 35 pg/cell — ABNORMAL HIGH (ref 26–32)
MCHC: 33 g/dL (ref 32–36)
MCV: 106 fL — ABNORMAL HIGH (ref 79–95)
Mono # K/uL: 0.4 10*3/uL (ref 0.2–0.9)
Monocyte %: 1 %
Neut # K/uL: 17.6 10*3/uL — ABNORMAL HIGH (ref 1.6–6.1)
Nucl RBC # K/uL: 0.7 10*3/uL — ABNORMAL HIGH (ref 0.0–0.0)
Nucl RBC %: 1.8 /100 WBC — ABNORMAL HIGH (ref 0.0–0.2)
Platelets: 16 10*3/uL — CL (ref 160–370)
RBC: 2.4 MIL/uL — ABNORMAL LOW (ref 3.9–5.2)
RDW: 21.9 % — ABNORMAL HIGH (ref 11.7–14.4)
Seg Neut %: 39 %
WBC: 37.5 10*3/uL — ABNORMAL HIGH (ref 4.0–10.0)

## 2014-12-31 LAB — COMPREHENSIVE METABOLIC PANEL
ALT: 21 U/L (ref 0–35)
AST: 30 U/L (ref 0–35)
Albumin: 4.3 g/dL (ref 3.5–5.2)
Alk Phos: 67 U/L (ref 35–105)
Anion Gap: 14 (ref 7–16)
Bilirubin,Total: 0.4 mg/dL (ref 0.0–1.2)
CO2: 23 mmol/L (ref 20–28)
Calcium: 8.9 mg/dL (ref 8.6–10.2)
Chloride: 103 mmol/L (ref 96–108)
Creatinine: 0.83 mg/dL (ref 0.51–0.95)
GFR,Black: 92 *
GFR,Caucasian: 80 *
Glucose: 93 mg/dL (ref 60–99)
Lab: 15 mg/dL (ref 6–20)
Potassium: 4.4 mmol/L (ref 3.3–5.1)
Sodium: 140 mmol/L (ref 133–145)
Total Protein: 6.7 g/dL (ref 6.3–7.7)

## 2014-12-31 LAB — RED BLOOD CELLS
Coded Blood type: 6200
Component blood type: A POS
Dispense status: TRANSFUSED

## 2014-12-31 LAB — T4, FREE: Free T4: 1 ng/dL (ref 0.9–1.7)

## 2014-12-31 LAB — DIFF MANUAL
Bands %: 8 % (ref 0–10)
Blasts %: 1 % — ABNORMAL HIGH (ref 0–0)
Diff Based On: 100 CELLS
Metamyelocyte %: 9 % — ABNORMAL HIGH (ref 0–1)
Myelocyte %: 31 % — ABNORMAL HIGH (ref 0–0)
Promyelocyte %: 3 % — ABNORMAL HIGH (ref 0–0)

## 2014-12-31 LAB — TSH: TSH: 3.63 u[IU]/mL (ref 0.27–4.20)

## 2015-01-04 ENCOUNTER — Ambulatory Visit
Admission: RE | Admit: 2015-01-04 | Discharge: 2015-01-04 | Disposition: A | Discharge status: Home / Self Care | Payer: Self-pay | Source: Ambulatory Visit | Attending: Hematology | Admitting: Hematology

## 2015-01-04 ENCOUNTER — Telehealth: Payer: Self-pay | Admitting: Hematology

## 2015-01-04 DIAGNOSIS — D72829 Elevated white blood cell count, unspecified: Secondary | ICD-10-CM

## 2015-01-04 DIAGNOSIS — D471 Chronic myeloproliferative disease: Secondary | ICD-10-CM

## 2015-01-04 LAB — COMPREHENSIVE METABOLIC PANEL, PL
ALT, PL: 20 U/L (ref 0–35)
AST, PL: 36 U/L — ABNORMAL HIGH (ref 0–35)
Albumin, PL: 4.2 g/dL (ref 3.5–5.2)
Alk Phos, PL: 66 U/L (ref 35–105)
Anion Gap,PL: 14 (ref 7–16)
Bilirubin Total, PL: 0.4 mg/dL (ref 0.0–1.2)
CO2,Plasma: 25 mmol/L (ref 20–28)
Calcium, PL: 9.4 mg/dL (ref 8.6–10.2)
Chloride,Plasma: 103 mmol/L (ref 96–108)
Creatinine: 0.86 mg/dL (ref 0.51–0.95)
GFR,Black: 88 *
GFR,Caucasian: 77 *
Glucose,Plasma: 146 mg/dL — ABNORMAL HIGH (ref 60–99)
Potassium,Plasma: 4.4 mmol/L (ref 3.4–4.7)
Sodium,Plasma: 142 mmol/L (ref 133–145)
Total Protein, PL: 6.9 g/dL (ref 6.3–7.7)
UN,Plasma: 19 mg/dL (ref 6–20)

## 2015-01-04 LAB — NEUTROPHIL #-INSTRUMENT: Neutrophil #-Instrument: 25.3 10*3/uL

## 2015-01-04 LAB — DIFF MANUAL
Bands %: 7 % (ref 0–10)
Blasts %: 2 % — ABNORMAL HIGH (ref 0–0)
Diff Based On: 118 CELLS
Metamyelocyte %: 11 % — ABNORMAL HIGH (ref 0–1)
Myelocyte %: 31 % — ABNORMAL HIGH (ref 0–0)

## 2015-01-04 LAB — CBC AND DIFFERENTIAL
Baso # K/uL: 0 10*3/uL (ref 0.0–0.1)
Basophil %: 0 %
Eos # K/uL: 0 10*3/uL (ref 0.0–0.4)
Eosinophil %: 0 %
Hematocrit: 25 % — ABNORMAL LOW (ref 34–45)
Hemoglobin: 8.2 g/dL — ABNORMAL LOW (ref 11.2–15.7)
Lymph # K/uL: 1.3 10*3/uL (ref 1.2–3.7)
Lymphocyte %: 3.4 %
MCH: 34 pg/cell — ABNORMAL HIGH (ref 26–32)
MCHC: 32 g/dL (ref 32–36)
MCV: 105 fL — ABNORMAL HIGH (ref 79–95)
Mono # K/uL: 0 10*3/uL — ABNORMAL LOW (ref 0.2–0.9)
Monocyte %: 0 %
Neut # K/uL: 22.7 10*3/uL — ABNORMAL HIGH (ref 1.6–6.1)
Nucl RBC # K/uL: 0.8 10*3/uL — ABNORMAL HIGH (ref 0.0–0.0)
Nucl RBC %: 2 /100 WBC — ABNORMAL HIGH (ref 0.0–0.2)
Platelets: 24 10*3/uL — ABNORMAL LOW (ref 160–370)
RBC: 2.4 MIL/uL — ABNORMAL LOW (ref 3.9–5.2)
RDW: 20.9 % — ABNORMAL HIGH (ref 11.7–14.4)
Seg Neut %: 46.6 %
WBC: 42 10*3/uL — ABNORMAL HIGH (ref 4.0–10.0)

## 2015-01-04 LAB — LD, PL: LD, PL: 788 U/L — ABNORMAL HIGH (ref 118–225)

## 2015-01-04 LAB — TYPE AND SCREEN
ABO RH Blood Type: A POS
Antibody Screen: NEGATIVE

## 2015-01-04 NOTE — Telephone Encounter (Signed)
Let patient know her lab results. She asked if her Decitabine infusions can be later in the day, 2:30pm or later. I told her the infusion center is booked but I would ask.  Good understanding.

## 2015-01-04 NOTE — Telephone Encounter (Signed)
appts have been changed.

## 2015-01-06 ENCOUNTER — Telehealth: Payer: Self-pay | Admitting: Hematology

## 2015-01-06 NOTE — Patient Instructions (Signed)
Kari Aquila  1960/07/07  Dx: Myeloproliferative Disorder  Treatment: Decitabine 5 days out of 28 days  Labs: every 2 weeks  Antiemetic: [place], RN/[name] [phone]  Updated: 01/07/15        January 2017   Sunday Monday Tuesday Wednesday Thursday Friday Saturday   1     2     3  FOLLOW UP with Dr. Liesveld@ 2:45PM   4  Cycle 2: Day 1   Decitabine@  2:30pm   5  Decitabine@  2:30pm__   6  Decitabine@  2:30pm   7  Decitabine@  12:30pm     8  Decitabine@ 12:00pm   9     10     11     12     13     14       15     16     17     18     19     20     21       22     23     24     25     26     27     28       29     30     31  FOLLOW UP with Dr. Liesveld@ 2:45PM                                February 2017   Sunday Monday Tuesday Wednesday Thursday Friday Saturday                  1  Cycle 3: Day 1  Decitabine@  2:30pm 2  Decitabine@  3:30pm   3  Decitabine@  2:30pm   4  Decitabine@ 1:00pm     5  Decitabine@ 1:00pm 6     7     8     9     10     11       12     13     14     15     16     17     18       19     20     21     22     23     24     25       26     27     28  FOLLOW UP with Dr. Liesveld@ ___                                March 2017   Sunday Monday Tuesday Wednesday Thursday Friday Saturday                  1  Cycle 4: Day 1  Decitabine@___   2  Decitabine@___   3  Decitabine@___   4  Decitabine@___     5  Decitabine@___   6     7     8     9     10     11       12     13     14     15   16     17     18       19     20     21     22     23     24     25       26     27     28   FOLLOW UP with [name] ___   29  Cycle 5: Day 1  Decitabine@___    30  Decitabine@___    31  Decitabine@___      Decitabine@___               Decitabine (Dacogen) - given intervenously over about 1-2 hour, usually for myelodysplastic syndromes (MDS)    Possible side effects:   fatigue, headache, nausea, constipation, diarrhea, cough, high blood sugar, fever, or mouth sores.    This medicine may cause bone  marrow suppression (low blood counts):   A decrease in white blood cells    increases risk for infections   signs of infection: fever, chills, cough, sore throat, pain or difficulty passing urine    A decrease in red blood cells   causes weakness, feeling tired (fatigued), fainting spells, dizziness or lightheadedness, shortness of breath, or heart palpitations   A decrease in platelets    may cause bleeding, bruising, pinpoint red spots on the skin, nose bleeds, vaginal bleeding, bloody or black, tarry stools, or blood in the urine

## 2015-01-06 NOTE — Telephone Encounter (Signed)
Noted  

## 2015-01-09 HISTORY — PX: BONE MARROW TRANSPLANT: SHX200

## 2015-01-11 ENCOUNTER — Telehealth: Payer: Self-pay | Admitting: Hematology

## 2015-01-11 ENCOUNTER — Ambulatory Visit: Payer: Self-pay | Admitting: Oncology

## 2015-01-11 DIAGNOSIS — M5416 Radiculopathy, lumbar region: Secondary | ICD-10-CM

## 2015-01-11 HISTORY — DX: Radiculopathy, lumbar region: M54.16

## 2015-01-11 NOTE — Telephone Encounter (Signed)
appointment rescheduled per instructions

## 2015-01-11 NOTE — Telephone Encounter (Signed)
Talked to Carly Higgins and she states she is in so much pain that she needs to cancel her appointment today. I let her know that she gets Decitabine tomorrow and kelly likely, NP will meet her in infusion to see her before her infusion. She was agreeable to that. She will stop at the lab tomorrow to get blood drawn.

## 2015-01-11 NOTE — Patient Instructions (Signed)
Carly Higgins  [birth year]  Dx: Myeloproliferative Disorder  Treatment: Decitabine 5 days out of 28 days  Labs: every 2 weeks  Antiemetic: [place], RN/[name] [phone]  Updated: 01/07/15          January 2017   Sunday Monday Tuesday Wednesday Thursday Friday Saturday   1     2     3     FOLLOW UP with Dr. Liesveld@    2:45 PM    4     Cycle 2: Day 1   Decitabine@    4:30 PM    5    Decitabine@    4:30 PM    6     Decitabine@    4:30 PM    7     Decitabine@ 12:30 PM   8     Decitabine@   12:00 PM 9     10     11     12     13     14       15     16     17     18     19     20     21       22     23     24     25     26     27     28       29     30     31     FOLLOW UP with Dr. Liesveld@    2:45 PM                                   February 2017   Sunday Monday Tuesday Wednesday Thursday Friday Saturday                  1     Cycle 3: Day 1  Decitabine@    4:00 PM    2     Decitabine@    4:00 PM    3     Decitabine@    4:00 PM    4     Decitabine@    2:00 PM     5   Decitabine@  2:00 PM 6     7     8     9     10     11       12     13     14     15     16     17     18       19     20     21     22     23     24     25       26     27     28   FOLLOW UP with Dr. Liesveld@____                                 March 2017   Sunday Monday Tuesday Wednesday Thursday Friday Saturday                   1  Cycle 4: Day  1  Decitabine@___    2  Decitabine@___    3  Decitabine@___    4  Decitabine@___      5  Decitabine@___    6     7     8     9     10     11       12     13     14     15     16     17     18       19     20     21     22     23     24     25       26     27     28   FOLLOW UP with [name] ___   29  Cycle 5: Day 1  Decitabine@___    30  Decitabine@___    31  Decitabine@___      Decitabine@___               Decitabine (Dacogen) - given intervenously over about 1-2 hour, usually for myelodysplastic syndromes (MDS)    Possible side effects:   fatigue, headache, nausea, constipation, diarrhea, cough,  high blood sugar, fever, or mouth sores.    This medicine may cause bone marrow suppression (low blood counts):   A decrease in white blood cells    increases risk for infections   signs of infection: fever, chills, cough, sore throat, pain or difficulty passing urine    A decrease in red blood cells   causes weakness, feeling tired (fatigued), fainting spells, dizziness or lightheadedness, shortness of breath, or heart palpitations   A decrease in platelets    may cause bleeding, bruising, pinpoint red spots on the skin, nose bleeds, vaginal bleeding, bloody or black, tarry stools, or blood in the urine

## 2015-01-11 NOTE — Telephone Encounter (Signed)
Talked to Santiago Glad and Dr. Jan Fireman. He is prescribing mederol dose pack for Carly Higgins due to bulging disk in her back. Per [place], she is okay to get prednisone.  Good understanding.

## 2015-01-12 ENCOUNTER — Telehealth: Payer: Self-pay | Admitting: Hematology

## 2015-01-12 ENCOUNTER — Ambulatory Visit: Payer: Self-pay

## 2015-01-12 ENCOUNTER — Inpatient Hospital Stay: Payer: Self-pay

## 2015-01-12 ENCOUNTER — Ambulatory Visit: Payer: Self-pay | Admitting: Oncology

## 2015-01-12 ENCOUNTER — Ambulatory Visit
Admission: RE | Admit: 2015-01-12 | Discharge: 2015-01-12 | Disposition: A | Discharge status: Home / Self Care | Payer: Self-pay | Source: Ambulatory Visit | Attending: Hematology | Admitting: Hematology

## 2015-01-12 VITALS — BP 149/87 | HR 103 | Temp 95.7°F | Resp 16 | Wt 263.8 lb

## 2015-01-12 DIAGNOSIS — D471 Chronic myeloproliferative disease: Secondary | ICD-10-CM

## 2015-01-12 DIAGNOSIS — D72829 Elevated white blood cell count, unspecified: Secondary | ICD-10-CM

## 2015-01-12 LAB — CBC AND DIFFERENTIAL
Baso # K/uL: 0 10*3/uL (ref 0.0–0.1)
Basophil %: 0 %
Eos # K/uL: 0 10*3/uL (ref 0.0–0.4)
Eosinophil %: 0 %
Hematocrit: 28 % — ABNORMAL LOW (ref 34–45)
Hemoglobin: 8.9 g/dL — ABNORMAL LOW (ref 11.2–15.7)
Lymph # K/uL: 4.1 10*3/uL — ABNORMAL HIGH (ref 1.2–3.7)
Lymphocyte %: 3 %
MCH: 34 pg/cell — ABNORMAL HIGH (ref 26–32)
MCHC: 32 g/dL (ref 32–36)
MCV: 104 fL — ABNORMAL HIGH (ref 79–95)
Mono # K/uL: 0.7 10*3/uL (ref 0.2–0.9)
Monocyte %: 0.5 %
Neut # K/uL: 81 10*3/uL — ABNORMAL HIGH (ref 1.6–6.1)
Nucl RBC # K/uL: 1.8 10*3/uL — ABNORMAL HIGH (ref 0.0–0.0)
Nucl RBC %: 1.3 /100 WBC — ABNORMAL HIGH (ref 0.0–0.2)
Platelets: 99 10*3/uL — ABNORMAL LOW (ref 160–370)
RBC: 2.7 MIL/uL — ABNORMAL LOW (ref 3.9–5.2)
RDW: 20.8 % — ABNORMAL HIGH (ref 11.7–14.4)
Seg Neut %: 52.5 %
WBC: 137.3 10*3/uL — ABNORMAL HIGH (ref 4.0–10.0)

## 2015-01-12 LAB — DIFF MANUAL
Bands %: 6 % (ref 0–10)
Blasts %: 3 % — ABNORMAL HIGH (ref 0–0)
Diff Based On: 200 CELLS
Metamyelocyte %: 19 % — ABNORMAL HIGH (ref 0–1)
Myelocyte %: 15 % — ABNORMAL HIGH (ref 0–0)
Promyelocyte %: 2 % — ABNORMAL HIGH (ref 0–0)

## 2015-01-12 LAB — LD, PL: LD, PL: 1917 U/L — ABNORMAL HIGH (ref 118–225)

## 2015-01-12 LAB — COMPREHENSIVE METABOLIC PANEL, PL
ALT, PL: 14 U/L (ref 0–35)
AST, PL: 56 U/L — ABNORMAL HIGH (ref 0–35)
Albumin, PL: 4.6 g/dL (ref 3.5–5.2)
Alk Phos, PL: 81 U/L (ref 35–105)
Anion Gap,PL: 17 — ABNORMAL HIGH (ref 7–16)
Bilirubin Total, PL: 0.6 mg/dL (ref 0.0–1.2)
CO2,Plasma: 25 mmol/L (ref 20–28)
Calcium, PL: 9.8 mg/dL (ref 8.6–10.2)
Chloride,Plasma: 99 mmol/L (ref 96–108)
Creatinine: 0.87 mg/dL (ref 0.51–0.95)
GFR,Black: 87 *
GFR,Caucasian: 75 *
Glucose,Plasma: 209 mg/dL — ABNORMAL HIGH (ref 60–99)
Potassium,Plasma: 4.9 mmol/L — ABNORMAL HIGH (ref 3.4–4.7)
Sodium,Plasma: 141 mmol/L (ref 133–145)
Total Protein, PL: 8 g/dL — ABNORMAL HIGH (ref 6.3–7.7)
UN,Plasma: 21 mg/dL — ABNORMAL HIGH (ref 6–20)

## 2015-01-12 LAB — NEUTROPHIL #-INSTRUMENT: Neutrophil #-Instrument: 81.2 10*3/uL

## 2015-01-12 LAB — TYPE AND SCREEN
ABO RH Blood Type: A POS
Antibody Screen: NEGATIVE

## 2015-01-12 MED ORDER — ALLOPURINOL 300 MG PO TABS *I*
300.0000 mg | ORAL_TABLET | Freq: Every day | ORAL | 0 refills | Status: DC
Start: 2015-01-12 — End: 2015-06-06

## 2015-01-12 MED ORDER — GRANISETRON HCL 1 MG PO TABS *I*
1.0000 mg | ORAL_TABLET | Freq: Once | ORAL | Status: AC
Start: 2015-01-12 — End: 2015-01-12

## 2015-01-12 MED ORDER — SODIUM CHLORIDE 0.9 % IV SOLN WRAPPED *I*
30.0000 mL/h | Status: DC | PRN
Start: 2015-01-12 — End: 2015-01-13

## 2015-01-12 MED ORDER — SODIUM CHLORIDE 0.9 % IV SOLN WRAPPED *I*
20.0000 mg/m2 | Freq: Once | INTRAVENOUS | Status: AC
Start: 2015-01-12 — End: 2015-01-12
  Administered 2015-01-12: 47 mg via INTRAVENOUS
  Filled 2015-01-12: qty 9.4

## 2015-01-12 MED ORDER — HYDROXYUREA 500 MG PO CAPS *I*
500.0000 mg | ORAL_CAPSULE | Freq: Two times a day (BID) | ORAL | 0 refills | Status: AC
Start: 2015-01-12 — End: 2015-02-11

## 2015-01-12 MED ORDER — GRANISETRON HCL 1 MG PO TABS *I*
ORAL_TABLET | ORAL | Status: AC
Start: 2015-01-12 — End: 2015-01-12
  Administered 2015-01-12: 1 mg via ORAL
  Filled 2015-01-12: qty 1

## 2015-01-12 NOTE — Progress Notes (Signed)
Pt tolerated D1 Decitabine well via PIV. Consent in chart, labs within tx parameters. PIV placed in L forearm. PIV with pos. Blood return noted pre/post infusion. Remains in place for tomorrows tx. D/c home, next appts set.

## 2015-01-13 ENCOUNTER — Inpatient Hospital Stay: Payer: Self-pay

## 2015-01-13 ENCOUNTER — Ambulatory Visit: Payer: Self-pay

## 2015-01-13 ENCOUNTER — Other Ambulatory Visit: Payer: Self-pay

## 2015-01-13 VITALS — BP 157/73 | HR 82 | Temp 95.7°F | Resp 16 | Wt 262.7 lb

## 2015-01-13 DIAGNOSIS — D471 Chronic myeloproliferative disease: Secondary | ICD-10-CM

## 2015-01-13 MED ORDER — GRANISETRON HCL 1 MG PO TABS *I*
1.0000 mg | ORAL_TABLET | Freq: Once | ORAL | Status: AC
Start: 2015-01-13 — End: 2015-01-13
  Administered 2015-01-13: 1 mg via ORAL

## 2015-01-13 MED ORDER — GRANISETRON HCL 1 MG PO TABS *I*
ORAL_TABLET | ORAL | Status: DC
Start: 2015-01-13 — End: 2015-01-13
  Filled 2015-01-13: qty 1

## 2015-01-13 MED ORDER — SODIUM CHLORIDE 0.9 % IV SOLN WRAPPED *I*
20.0000 mg/m2 | Freq: Once | INTRAVENOUS | Status: AC
Start: 2015-01-13 — End: 2015-01-13
  Administered 2015-01-13: 47 mg via INTRAVENOUS
  Filled 2015-01-13: qty 9.4

## 2015-01-13 MED ORDER — SODIUM CHLORIDE 0.9 % IV SOLN WRAPPED *I*
30.0000 mL/h | Status: DC | PRN
Start: 2015-01-13 — End: 2015-01-13

## 2015-01-13 NOTE — Progress Notes (Signed)
Subjective:      Patient ID: Carly Higgins is a 55 y.o. 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 By [employer] testing, she has mutations of TET@, ASCL1, CCND2, GATA2, SRSF2.       Treatment History:  1.  Decitabine 20 mg/m2 IV daily x 5 days - 12/15/14  2.  Intermittent/dose adjusted Hydroxyurea    HPI  Carly Higgins comes to the office today for her scheduled follow up.  She has had some chronic right buttock/?sciatica pain.  She was seen by her PCP yesterday and was prescribed a Medrol dose pack and also tramadol and Flexeril. Her pain persists.  However she is working full time and is ambulatory.  She continues to have night sweats.  She is not having any nausea or vomiting.    Review of Systems   All other systems reviewed and are negative.    Objective:   There were no vitals filed for this visit.    Laboratory Data:  Results for Carly Higgins (MRN [id number]) as of 01/13/2015 15:21   Ref. Range 12/31/2014 09:21 01/04/2015 07:30 01/12/2015 07:05   WBC Latest Ref Range: 4.0 - 10.0 THOU/uL 37.5 (H) 42.0 (H) 137.3 (H)   RBC Latest Ref Range: 3.9 - 5.2 MIL/uL 2.4 (L) 2.4 (L) 2.7 (L)   Hemoglobin Latest Ref Range: 11.2 - 15.7 g/dL 8.3 (L) 8.2 (L) 8.9 (L)   Hematocrit Latest Ref Range: 34 - 45 % 25 (L) 25 (L) 28 (L)   MCV Latest Ref Range: 79 - 95 fL 106 (H) 105 (H) 104 (H)   MCH Latest Ref Range: 26 - 32 pg/cell 35 (H) 34 (H) 34 (H)   MCHC Latest Ref Range: 32 - 36 g/dL 33 32 32   RDW Latest Ref Range: 11.7 - 14.4 % 21.9 (H) 20.9 (H) 20.8 (H)   Platelets Latest Ref Range: 160 - 370 THOU/uL 16 (LL) 24 (L) 99 (L)   Manual DIFF Unknown RESULTS RESULTS RESULTS   Neut # K/uL Latest Ref Range: 1.6 - 6.1 THOU/uL 17.6 (H) 22.7 (H) 81.0 (H)   Neutrophil #-Instrument Latest Units: THOU/uL  25.3 81.2   Lymph # K/uL Latest Ref Range: 1.2 - 3.7 THOU/uL 3.0 1.3 4.1 (H)   Mono # K/uL Latest Ref Range: 0.2 - 0.9 THOU/uL 0.4 0.0 (L) 0.7   Eos # K/uL Latest Ref Range: 0.0 - 0.4  THOU/uL 0.0 0.0 0.0   Baso # K/uL Latest Ref Range: 0.0 - 0.1 THOU/uL 0.0 0.0 0.0   Nucl RBC # K/uL Latest Ref Range: 0.0 - 0.0 THOU/uL 0.7 (H) 0.8 (H) 1.8 (H)   Seg Neut % Latest Units: % 39.0 46.6 52.5   Lymphocyte % Latest Units: % 8.0 3.4 3.0   Monocyte % Latest Units: % 1.0 0.0 0.5   Eosinophil % Latest Units: % 0.0 0.0 0.0   Basophil % Latest Units: % 0.0 0.0 0.0   Nucl RBC % Latest Ref Range: 0.0 - 0.2 /100 WBC 1.8 (H) 2.0 (H) 1.3 (H)   Bands % Latest Ref Range: 0 - 10 % '8 7 6   '$ Metamyelocyte % Latest Ref Range: 0 - 1 % 9 (H) 11 (H) 19 (H)   Myelocyte % Latest Ref Range: 0 - 0 % 31 (H) 31 (H) 15 (H)   Promyelocyte % Latest Ref Range: 0 - 0 % 3 (H)  2 (H)   Blasts % Latest Ref Range: 0 - 0 %  1 (H) 2 (H) 3 (H)   Diff Based On Latest Units: CELLS 100 118 200     Results for Carly Higgins (MRN [id number]) as of 01/13/2015 15:21   Ref. Range 01/04/2015 07:30 01/12/2015 07:05   Sodium,Plasma Latest Ref Range: 133 - 145 mmol/L 142 141   Potassium,Plasma Latest Ref Range: 3.4 - 4.7 mmol/L 4.4 4.9 (H)   Chloride,Plasma Latest Ref Range: 96 - 108 mmol/L 103 99   CO2,Plasma Latest Ref Range: 20 - 28 mmol/L 25 25   Anion Gap,PL Latest Ref Range: 7 - '16  14 17 '$ (H)   UN,Plasma Latest Ref Range: 6 - 20 mg/dL 19 21 (H)   Creatinine Latest Ref Range: 0.51 - 0.95 mg/dL 0.86 0.87   GFR,Black Latest Units: * 88 87   GFR,Caucasian Latest Units: * 77 66   Glucose,Plasma Latest Ref Range: 60 - 99 mg/dL 146 (H) 209 (H)   Calcium, PL Latest Ref Range: 8.6 - 10.2 mg/dL 9.4 9.8   Total Protein, PL Latest Ref Range: 6.3 - 7.7 g/dL 6.9 8.0 (H)   Albumin, PL Latest Ref Range: 3.5 - 5.2 g/dL 4.2 4.6   LD, PL Latest Ref Range: 118 - 225 U/L 788 (H) 1917 (H)   ALT, PL Latest Ref Range: 0 - 35 U/L 20 14   AST, PL Latest Ref Range: 0 - 35 U/L 36 (H) 56 (H)   Alk Phos, PL Latest Ref Range: 35 - 105 U/L 66 81   Bilirubin Total, PL Latest Ref Range: 0.0 - 1.2 mg/dL 0.4 0.6     Physical Exam   Constitutional: She is oriented to person, place, and time.  She appears well-developed and well-nourished.   HENT:   Mouth/Throat: Oropharynx is clear and moist. No oropharyngeal exudate.   Eyes: No scleral icterus.   Neck: Neck supple.   Cardiovascular: Normal rate and regular rhythm.    Pulmonary/Chest: Effort normal and breath sounds normal.   Abdominal: Soft. Bowel sounds are normal.   Neurological: She is alert and oriented to person, place, and time.   Skin: Skin is warm and dry. No rash noted.     Assessment:     Carly Higgins is a 55 year old female with unclassified myeloproliferative disorder.  Given progressive leukocytosis, anemia and thrombocytopenia she was started on  Decitabine.  She has tolerated the first cycle very well.  She has had a marked elevation of her WBC since last measured.  I don't think one day of steroid for her back/right hip pain is the cause of this elevation.  Most likely progressive disease.  For now will proceed as follows:    Plan:     1.  Begin cycle # 2 decitabine as planned today.  2.  Will have patient restart Hydroxyurea 500 mg BID and allopurinol 300 mg daily x 10 days.  Will recheck CBC on Friday 01/14/15 and adjust Hydroxyurea dose as indicated.  3.  She has a brother and sister who have not been HLA typed per Rick's request. She has several potential donors in the unrelated donor registry.  I explained to Carly Higgins that if her disease continues to progress we will need to think about other treatment options.  I told her we would be remiss if we didn't discuss all options including transplant.  Will discuss transplant further with [name] and the transplant coordinators.  4.  Follow up in 4 weeks.      Jasmine Awe RN,MS  Nurse  Practitioner

## 2015-01-13 NOTE — Progress Notes (Signed)
Pt arrived stable.  Feeling ok today.  Tolerated decitabine via piv.  Blood return noted pre and post infusion.  Next appointment scheduled.  Encouraged to call PRN.  Stable upon d/c.

## 2015-01-14 ENCOUNTER — Ambulatory Visit: Payer: Self-pay

## 2015-01-14 ENCOUNTER — Inpatient Hospital Stay: Payer: Self-pay

## 2015-01-14 ENCOUNTER — Telehealth: Payer: Self-pay

## 2015-01-14 VITALS — BP 150/73 | HR 103 | Temp 96.1°F | Resp 16 | Wt 261.1 lb

## 2015-01-14 DIAGNOSIS — D72829 Elevated white blood cell count, unspecified: Secondary | ICD-10-CM

## 2015-01-14 DIAGNOSIS — D471 Chronic myeloproliferative disease: Secondary | ICD-10-CM

## 2015-01-14 LAB — CBC AND DIFFERENTIAL
Baso # K/uL: 0 10*3/uL (ref 0.0–0.1)
Basophil %: 0 %
Eos # K/uL: 0 10*3/uL (ref 0.0–0.4)
Eosinophil %: 0 %
Hematocrit: 25 % — ABNORMAL LOW (ref 34–45)
Hemoglobin: 8.3 g/dL — ABNORMAL LOW (ref 11.2–15.7)
Lymph # K/uL: 7.3 10*3/uL — ABNORMAL HIGH (ref 1.2–3.7)
Lymphocyte %: 5.3 %
MCH: 34 pg/cell — ABNORMAL HIGH (ref 26–32)
MCHC: 33 g/dL (ref 32–36)
MCV: 103 fL — ABNORMAL HIGH (ref 79–95)
Mono # K/uL: 0 10*3/uL — ABNORMAL LOW (ref 0.2–0.9)
Monocyte %: 0 %
Neut # K/uL: 87.6 10*3/uL — ABNORMAL HIGH (ref 1.6–6.1)
Nucl RBC # K/uL: 0.9 10*3/uL — ABNORMAL HIGH (ref 0.0–0.0)
Nucl RBC %: 0.6 /100 WBC — ABNORMAL HIGH (ref 0.0–0.2)
Platelets: 91 10*3/uL — ABNORMAL LOW (ref 160–370)
RBC: 2.4 MIL/uL — ABNORMAL LOW (ref 3.9–5.2)
RDW: 20.6 % — ABNORMAL HIGH (ref 11.7–14.4)
Seg Neut %: 51.6 %
WBC: 146 10*3/uL — ABNORMAL HIGH (ref 4.0–10.0)

## 2015-01-14 LAB — DIFF MANUAL
Bands %: 8 % (ref 0–10)
Blasts %: 2 % — ABNORMAL HIGH (ref 0–0)
Diff Based On: 132 CELLS
Metamyelocyte %: 14 % — ABNORMAL HIGH (ref 0–1)
Myelocyte %: 19 % — ABNORMAL HIGH (ref 0–0)

## 2015-01-14 LAB — NEUTROPHIL #-INSTRUMENT: Neutrophil #-Instrument: 85.4 10*3/uL

## 2015-01-14 MED ORDER — GRANISETRON HCL 1 MG PO TABS *I*
ORAL_TABLET | ORAL | Status: DC
Start: 2015-01-14 — End: 2015-01-15
  Filled 2015-01-14: qty 1

## 2015-01-14 MED ORDER — SODIUM CHLORIDE 0.9 % IV SOLN WRAPPED *I*
20.0000 mg/m2 | Freq: Once | INTRAVENOUS | Status: AC
Start: 2015-01-14 — End: 2015-01-14
  Administered 2015-01-14: 47 mg via INTRAVENOUS
  Filled 2015-01-14: qty 9.4

## 2015-01-14 MED ORDER — GRANISETRON HCL 1 MG PO TABS *I*
1.0000 mg | ORAL_TABLET | Freq: Once | ORAL | Status: AC
Start: 2015-01-14 — End: 2015-01-14
  Administered 2015-01-14: 1 mg via ORAL

## 2015-01-14 MED ORDER — SODIUM CHLORIDE 0.9 % IV SOLN WRAPPED *I*
30.0000 mL/h | Status: DC | PRN
Start: 2015-01-14 — End: 2015-01-15

## 2015-01-14 NOTE — Telephone Encounter (Signed)
[name] is returning a call from [place]. She states this is regarding being unable to do the PFT outside of [facility]. Needs it after 2:30pm.    She is requesting a call back at [phone].

## 2015-01-14 NOTE — Progress Notes (Signed)
Patient here for IV Decitabine infusion, tolerated well, no complaint. Positive blood return from PIV before and after infusion. CBC drawn by writer to recheck WBC count per K. Likly's 1/3 note.  AVS declined, next appointment set, discharged to home.

## 2015-01-15 ENCOUNTER — Ambulatory Visit: Payer: Self-pay | Admitting: Oncology

## 2015-01-15 VITALS — BP 145/85 | HR 107 | Temp 95.9°F | Resp 18 | Wt 259.0 lb

## 2015-01-15 DIAGNOSIS — D471 Chronic myeloproliferative disease: Secondary | ICD-10-CM

## 2015-01-15 MED ORDER — GRANISETRON HCL 1 MG PO TABS *I*
ORAL_TABLET | ORAL | Status: DC
Start: 2015-01-15 — End: 2015-01-15
  Filled 2015-01-15: qty 1

## 2015-01-15 MED ORDER — SODIUM CHLORIDE 0.9 % IV SOLN WRAPPED *I*
20.0000 mg/m2 | Freq: Once | INTRAVENOUS | Status: AC
Start: 2015-01-15 — End: 2015-01-15
  Administered 2015-01-15: 47 mg via INTRAVENOUS
  Filled 2015-01-15: qty 9.4

## 2015-01-15 MED ORDER — GRANISETRON HCL 1 MG PO TABS *I*
1.0000 mg | ORAL_TABLET | Freq: Once | ORAL | Status: AC
Start: 2015-01-15 — End: 2015-01-15
  Administered 2015-01-15: 1 mg via ORAL

## 2015-01-15 MED ORDER — SODIUM CHLORIDE 0.9 % IV SOLN WRAPPED *I*
30.0000 mL/h | Status: DC | PRN
Start: 2015-01-15 — End: 2015-01-15

## 2015-01-15 NOTE — Progress Notes (Signed)
Carly Higgins, 55 y.o. female is here for chemotherapy infusion of: decitabine  Dose verified by 2 Rn's [x] YES [] NO  Pt identification verified by 2 Rn's  [x] YES [] NO  Treatment plan verified by 2 Rn's by provider note: [x] YES [] NO  Consent present in chart: [x] YES [] NO  Premeds given: [x] YES [] NO  IV device: [] TCVC[] IVAD[] PICC[x] PIV  IV therapy site and patency before infusion: forearm right, condition patent and no redness  IV Fluid given: [] YES [x] NO  Blood return present before infusion: [x] YES [] NO  IV therapy site and patency during infusion: forearm right, condition patent and no redness  Blood return present during infusion:[x] YES [] NO  Blood return present after infusion: [x] YES [] NO  IV therapy site and patency after infusion: forearm right, condition patent and no redness  Patient tolerated transfusion well    Patient and [x] YES [] NO family educated related to   [x] YES [] NOdrugs received   [x] YES [] NO toxities   [x] YES [] NO toxicity management  [x] YES [] NO follow up care.   Pt has received [employer]omp pt education sheet on chemotherapy received.

## 2015-01-16 ENCOUNTER — Ambulatory Visit: Payer: Self-pay

## 2015-01-16 VITALS — BP 148/80 | HR 91 | Temp 95.7°F | Resp 17 | Ht 65.75 in | Wt 259.0 lb

## 2015-01-16 DIAGNOSIS — D471 Chronic myeloproliferative disease: Secondary | ICD-10-CM

## 2015-01-16 MED ORDER — SODIUM CHLORIDE 0.9 % IV SOLN WRAPPED *I*
30.0000 mL/h | Status: DC | PRN
Start: 2015-01-16 — End: 2015-01-16

## 2015-01-16 MED ORDER — SODIUM CHLORIDE 0.9 % IV SOLN WRAPPED *I*
20.0000 mg/m2 | Freq: Once | INTRAVENOUS | Status: AC
Start: 2015-01-16 — End: 2015-01-16
  Administered 2015-01-16: 47 mg via INTRAVENOUS
  Filled 2015-01-16: qty 9.4

## 2015-01-16 MED ORDER — GRANISETRON HCL 1 MG PO TABS *I*
ORAL_TABLET | ORAL | Status: AC
Start: 2015-01-16 — End: 2015-01-16
  Administered 2015-01-16: 1 mg via ORAL
  Filled 2015-01-16: qty 1

## 2015-01-16 MED ORDER — GRANISETRON HCL 1 MG PO TABS *I*
1.0000 mg | ORAL_TABLET | Freq: Once | ORAL | Status: AC
Start: 2015-01-16 — End: 2015-01-16

## 2015-01-16 NOTE — Progress Notes (Signed)
D5 Decitabine given via piv without adverse reaction. Patient arrived with piv in place, IV patent and with blood return prior to chemotherapy. Blood return noted post chemo, IV removed prior to d/c home.

## 2015-01-17 ENCOUNTER — Telehealth: Payer: Self-pay

## 2015-01-17 ENCOUNTER — Telehealth: Payer: Self-pay | Admitting: Hematology

## 2015-01-17 ENCOUNTER — Ambulatory Visit
Admission: RE | Admit: 2015-01-17 | Discharge: 2015-01-17 | Disposition: A | Discharge status: Home / Self Care | Payer: Self-pay | Source: Ambulatory Visit | Attending: Hematology | Admitting: Hematology

## 2015-01-17 DIAGNOSIS — D72829 Elevated white blood cell count, unspecified: Secondary | ICD-10-CM

## 2015-01-17 DIAGNOSIS — D471 Chronic myeloproliferative disease: Secondary | ICD-10-CM

## 2015-01-17 LAB — COMPREHENSIVE METABOLIC PANEL, PL
ALT, PL: 25 U/L (ref 0–35)
AST, PL: 51 U/L — ABNORMAL HIGH (ref 0–35)
Albumin, PL: 4.6 g/dL (ref 3.5–5.2)
Alk Phos, PL: 77 U/L (ref 35–105)
Anion Gap,PL: 16 (ref 7–16)
Bilirubin Total, PL: 0.4 mg/dL (ref 0.0–1.2)
CO2,Plasma: 24 mmol/L (ref 20–28)
Calcium, PL: 9.4 mg/dL (ref 8.6–10.2)
Chloride,Plasma: 99 mmol/L (ref 96–108)
Creatinine: 0.84 mg/dL (ref 0.51–0.95)
GFR,Black: 91 *
GFR,Caucasian: 79 *
Glucose,Plasma: 233 mg/dL — ABNORMAL HIGH (ref 60–99)
Potassium,Plasma: 4.2 mmol/L (ref 3.4–4.7)
Sodium,Plasma: 139 mmol/L (ref 133–145)
Total Protein, PL: 7.5 g/dL (ref 6.3–7.7)
UN,Plasma: 22 mg/dL — ABNORMAL HIGH (ref 6–20)

## 2015-01-17 LAB — DIFF MANUAL
Bands %: 8 % (ref 0–10)
Blasts %: 2 % — ABNORMAL HIGH (ref 0–0)
Diff Based On: 121 CELLS
Metamyelocyte %: 7 % — ABNORMAL HIGH (ref 0–1)
Myelocyte %: 16 % — ABNORMAL HIGH (ref 0–0)
Promyelocyte %: 3 % — ABNORMAL HIGH (ref 0–0)

## 2015-01-17 LAB — CBC AND DIFFERENTIAL
Baso # K/uL: 0 10*3/uL (ref 0.0–0.1)
Basophil %: 0 %
Eos # K/uL: 1.2 10*3/uL — ABNORMAL HIGH (ref 0.0–0.4)
Eosinophil %: 0.8 %
Hematocrit: 28 % — ABNORMAL LOW (ref 34–45)
Hemoglobin: 9.1 g/dL — ABNORMAL LOW (ref 11.2–15.7)
Lymph # K/uL: 4.7 10*3/uL — ABNORMAL HIGH (ref 1.2–3.7)
Lymphocyte %: 2.5 %
MCH: 34 pg/cell — ABNORMAL HIGH (ref 26–32)
MCHC: 33 g/dL (ref 32–36)
MCV: 103 fL — ABNORMAL HIGH (ref 79–95)
Mono # K/uL: 0 10*3/uL — ABNORMAL LOW (ref 0.2–0.9)
Monocyte %: 0 %
Neut # K/uL: 107.3 10*3/uL — ABNORMAL HIGH (ref 1.6–6.1)
Nucl RBC # K/uL: 0.6 10*3/uL — ABNORMAL HIGH (ref 0.0–0.0)
Nucl RBC %: 0.4 /100 WBC — ABNORMAL HIGH (ref 0.0–0.2)
Platelets: 97 10*3/uL — ABNORMAL LOW (ref 160–370)
RBC: 2.7 MIL/uL — ABNORMAL LOW (ref 3.9–5.2)
RDW: 20.9 % — ABNORMAL HIGH (ref 11.7–14.4)
Seg Neut %: 61.1 %
WBC: 155.5 10*3/uL (ref 4.0–10.0)

## 2015-01-17 LAB — TYPE AND SCREEN
ABO RH Blood Type: A POS
Antibody Screen: NEGATIVE

## 2015-01-17 LAB — LD, PL: LD, PL: 2105 U/L — ABNORMAL HIGH (ref 118–225)

## 2015-01-17 LAB — NEUTROPHIL #-INSTRUMENT: Neutrophil #-Instrument: 91.6 10*3/uL

## 2015-01-17 NOTE — Telephone Encounter (Signed)
Critical lab value called in:     Name of provider:Liesveld     Time provider notified:1105     Time provider responded:  1106     Critical lab and value:WBC 156     Action taken as result of notification: Provider paged and is aware

## 2015-01-17 NOTE — Telephone Encounter (Signed)
Let Carly Higgins know that her wbc 155.5, Hct is 28%, platelets are 91K. I talked to [name] about her wbc and she states that it could be an effect of the steroids she got last week and she just finished her cycle yesterday. I let her know that we will continue to follow her counts every week. She will go for blood work next Monday. Good understanding.

## 2015-01-17 NOTE — Telephone Encounter (Signed)
Patient returned call   appointment scheduled for 01/20/15

## 2015-01-20 ENCOUNTER — Ambulatory Visit: Payer: Self-pay | Admitting: Hematology

## 2015-01-20 VITALS — BP 161/76 | HR 106 | Temp 96.3°F | Resp 18 | Ht 65.75 in | Wt 262.2 lb

## 2015-01-20 DIAGNOSIS — M7918 Myalgia, other site: Secondary | ICD-10-CM

## 2015-01-20 NOTE — Progress Notes (Signed)
Subjective:      Patient ID: Carly Higgins is a 55 y.o. 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 By [employer] testing, she has mutations of TET2, ASXL1, CCND2, GATA2, SRSF2.       Treatment History:  1.  Decitabine 20 mg/m2 IV daily x 5 days - Has now had 2 cycles.   2.  Intermittent/dose adjusted Hydroxyurea    HPI  [name] comes to the BMT clinic today so we can begin discussion of the role that stem cell transplantation might have in her disease.  She has had some chronic right buttock/?sciatica pain.  She was seen by her PCP yesterday and was prescribed a Medrol dose pack and also tramadol and Flexeril. Her pain and the bulging is much better, but her WBC has increased markedly on the steroids.  However she is working full time and is ambulatory.  She continues to have night sweats.  She is not having any nausea or vomiting. No fevers. She is now on a steroid taper.  She hopes to have vacation in [place] in March/April. Still smoking  Would like to try chantix again.    Active Ambulatory Problems     Diagnosis Date Noted    Leukocytosis 09/22/2013    MPN (myeloproliferative neoplasm) 01/19/2014     Resolved Ambulatory Problems     Diagnosis Date Noted    No Resolved Ambulatory Problems     No Additional Past Medical History        Current Outpatient Prescriptions   Medication Sig Dispense Refill    traMADol (ULTRAM) 50 MG tablet Take 50 mg by mouth every 6 hours as needed for Pain      cyclobenzaprine (FLEXERIL) 10 MG tablet Take 10 mg by mouth 3 times daily as needed for Muscle spasms      predniSONE (DELTASONE) 10 MG tablet Take 10 mg by mouth daily   As directed      ibuprofen (ADVIL,MOTRIN) 200 MG tablet Take 200 mg by mouth 3 times daily as needed for Pain      allopurinol (ZYLOPRIM) 300 MG tablet Take 1 tablet (300 mg total) by mouth daily 10 tablet 0    hydroxyurea (HYDREA) 500 MG capsule Take 1 capsule (500 mg total) by mouth 2  times daily 60 capsule 0    prochlorperazine (COMPAZINE) 10 MG tablet Take 1 tablet (10 mg total) by mouth 4 times daily as needed for Nausea 40 tablet 6    escitalopram (LEXAPRO) 10 MG tablet Take 15 mg by mouth daily       No current facility-administered medications for this visit.          Review of Systems   All other systems reviewed and are negative.    Objective:     Vitals:    01/20/15 1410   BP: 161/76   Pulse: 106   Resp: 18   Temp: 35.7 C (96.3 F)   Weight: 118.9 kg (262 lb 3.8 oz)   Height: 167 cm (5' 5.75")       Laboratory Data:        Lab results: 01/17/15  1026 01/14/15  1707 01/12/15  0705   WBC 155.5* 146.0* 137.3*   HEMOGLOBIN 9.1* 8.3* 8.9*   HEMATOCRIT 28* 25* 28*   RBC 2.7* 2.4* 2.7*   PLATELETS 97* 91* 99*     Blast % is about 2.     Physical Exam   Constitutional: Looks well;  in good spirits; came from work.   HENT:   Mouth/Throat: Oropharynx is clear and moist. No oropharyngeal exudate.   Eyes: No scleral icterus.   Neck: Neck supple.   Cardiovascular: Normal rate and regular rhythm.    Pulmonary/Chest: Effort normal and breath sounds normal.   Abdominal: Soft. Bowel sounds are normal. No splenomegaly   Neurological: She is alert and oriented to person, place, and time.   Skin: Skin is warm and dry. No rash noted.   No current pain over spine or at sciatic notch.     Assessment:     Ms. Strege is a 55 year old female with unclassified myeloproliferative disorder.  Given progressive leukocytosis, anemia and thrombocytopenia she was started on  Decitabine.  She has now had 2 cycles of decitabine, but WBC remains quite elevated possibly due to steroid effect as this developed in temporal association with this.     Plan:     1.  F/U after cycle 2 of decitabine.   2.  Will have patient restart Hydroxyurea 500 mg BID and allopurinol 300 mg daily x 10 days. She is still on 500 bid hydroxurea given WBC elevation .  3.  She has a brother and sister who have not been HLA typed per Zelma's  request. She has several potential donors in the unrelated donor registry.  Several of these are 10/10 donors. Given her WBC elevation persistently and her mutation status, I think we should entertain transplant before her disease progresses. Today, she met with Verlin Fester to discuss donor search and was given information about transplant.    We reviewed  --conditioning regimen--Bu/Flu most likely  --Need for PFTs and ECHO evaluation  --Chemotherapy side effects including alopecia, GI effect, organ damage  --Stem cell infusion  --GVHD prophylaxis, manifestation,and therapy  --Risk for infections and bleeding and need for transfusions and antibiotics.   --F/U and outcome expectations.     Amberlyn would prefer to wait on transplant until after her vacation.  I told her we will see how her blood counts settle out after the steroid effect has worn off.  We can continue decitabine in the interval.  She will continue with weekly blood work, and we will see her in a couple weeks.

## 2015-01-21 ENCOUNTER — Telehealth: Payer: Self-pay | Admitting: Hematology

## 2015-01-21 NOTE — Telephone Encounter (Signed)
Spoke with Carly Higgins and answered her questions about timing of BMT and reinforced what [name] discussed at yesterday's visit.

## 2015-01-24 ENCOUNTER — Telehealth: Payer: Self-pay | Admitting: Hematology

## 2015-01-24 ENCOUNTER — Ambulatory Visit
Admission: RE | Admit: 2015-01-24 | Discharge: 2015-01-24 | Disposition: A | Discharge status: Home / Self Care | Payer: Self-pay | Source: Ambulatory Visit | Attending: Hematology | Admitting: Hematology

## 2015-01-24 DIAGNOSIS — D471 Chronic myeloproliferative disease: Secondary | ICD-10-CM

## 2015-01-24 DIAGNOSIS — D72829 Elevated white blood cell count, unspecified: Secondary | ICD-10-CM

## 2015-01-24 LAB — COMPREHENSIVE METABOLIC PANEL, PL
ALT, PL: 20 U/L (ref 0–35)
AST, PL: 19 U/L (ref 0–35)
Albumin, PL: 4.5 g/dL (ref 3.5–5.2)
Alk Phos, PL: 56 U/L (ref 35–105)
Anion Gap,PL: 18 — ABNORMAL HIGH (ref 7–16)
Bilirubin Total, PL: 0.5 mg/dL (ref 0.0–1.2)
CO2,Plasma: 20 mmol/L (ref 20–28)
Calcium, PL: 9.5 mg/dL (ref 8.6–10.2)
Chloride,Plasma: 100 mmol/L (ref 96–108)
Creatinine: 0.75 mg/dL (ref 0.51–0.95)
GFR,Black: 104 *
GFR,Caucasian: 90 *
Glucose,Plasma: 173 mg/dL — ABNORMAL HIGH (ref 60–99)
Potassium,Plasma: 5.1 mmol/L — ABNORMAL HIGH (ref 3.4–4.7)
Sodium,Plasma: 138 mmol/L (ref 133–145)
Total Protein, PL: 7.6 g/dL (ref 6.3–7.7)
UN,Plasma: 30 mg/dL — ABNORMAL HIGH (ref 6–20)

## 2015-01-24 LAB — CBC AND DIFFERENTIAL
Baso # K/uL: 0 10*3/uL (ref 0.0–0.1)
Basophil %: 0 %
Eos # K/uL: 0 10*3/uL (ref 0.0–0.4)
Eosinophil %: 0 %
Hematocrit: 25 % — ABNORMAL LOW (ref 34–45)
Hemoglobin: 8.1 g/dL — ABNORMAL LOW (ref 11.2–15.7)
Lymph # K/uL: 1.2 10*3/uL (ref 1.2–3.7)
Lymphocyte %: 4.2 %
MCH: 32 pg/cell (ref 26–32)
MCHC: 32 g/dL (ref 32–36)
MCV: 101 fL — ABNORMAL HIGH (ref 79–95)
Mono # K/uL: 0 10*3/uL — ABNORMAL LOW (ref 0.2–0.9)
Monocyte %: 0 %
Neut # K/uL: 27.8 10*3/uL — ABNORMAL HIGH (ref 1.6–6.1)
Nucl RBC # K/uL: 0 10*3/uL (ref 0.0–0.0)
Nucl RBC %: 0.1 /100 WBC (ref 0.0–0.2)
Platelets: 38 10*3/uL — ABNORMAL LOW (ref 160–370)
RBC: 2.5 MIL/uL — ABNORMAL LOW (ref 3.9–5.2)
RDW: 20.1 % — ABNORMAL HIGH (ref 11.7–14.4)
Seg Neut %: 89.1 %
WBC: 29.9 10*3/uL — ABNORMAL HIGH (ref 4.0–10.0)

## 2015-01-24 LAB — DIFF MANUAL
Bands %: 4 % (ref 0–10)
Diff Based On: 118 CELLS
Myelocyte %: 3 % — ABNORMAL HIGH (ref 0–0)

## 2015-01-24 LAB — TYPE AND SCREEN
ABO RH Blood Type: A POS
Antibody Screen: NEGATIVE

## 2015-01-24 LAB — NEUTROPHIL #-INSTRUMENT: Neutrophil #-Instrument: 24.3 10*3/uL

## 2015-01-24 LAB — LD, PL: LD, PL: 523 U/L — ABNORMAL HIGH (ref 118–225)

## 2015-01-24 NOTE — Telephone Encounter (Signed)
Patient feeling very fatigued.  Felt like she was going to pass out early this morning.  Feeling a little bit better right now.  She is going to leave work early.  Her back is still bothering her.  She completed a medrol dose pack.  Was off the steroid for ~ 2 days and then called her doctor because her back pain was worse.  She was started back on tapering steroids on Saturday 01/21/14.     She is anemic with a HCT of 25%.  No bleeding.  No fevers.  No nausea or vomiting.  No diarrhea.    Encouraged the patient to settle in at home.  Stay hydrated and eat as she can.  I have instructed her to decrease her Hydroxyurea dose to 500 mg daily.  She will continue to get her CBC checked weekly.      Patient will call with any further questions or concerns.

## 2015-01-24 NOTE — Telephone Encounter (Incomplete)
Felt as thought was going to faint driving to work this am - lasted approx 15"  No other targeted complaints - "just doesn't feel right"  States is eating and drinking well  Reviewed labs, concerned about WBC dropping - offered reassurance re treatment  Stated she will probably go home from work. Asked if someone could pick her up- she stated didn't have anyone who could  Stated would forward to team  Verbalized understanding

## 2015-01-31 ENCOUNTER — Ambulatory Visit
Admission: RE | Admit: 2015-01-31 | Discharge: 2015-01-31 | Disposition: A | Discharge status: Home / Self Care | Payer: Self-pay | Source: Ambulatory Visit | Attending: Hematology | Admitting: Hematology

## 2015-01-31 ENCOUNTER — Ambulatory Visit: Payer: Self-pay

## 2015-01-31 ENCOUNTER — Telehealth: Payer: Self-pay

## 2015-01-31 VITALS — BP 140/67 | HR 81 | Temp 97.9°F | Resp 18 | Wt 264.9 lb

## 2015-01-31 DIAGNOSIS — D471 Chronic myeloproliferative disease: Secondary | ICD-10-CM

## 2015-01-31 DIAGNOSIS — D72829 Elevated white blood cell count, unspecified: Secondary | ICD-10-CM

## 2015-01-31 LAB — CBC AND DIFFERENTIAL
Baso # K/uL: 0 10*3/uL (ref 0.0–0.1)
Basophil %: 0 %
Eos # K/uL: 0 10*3/uL (ref 0.0–0.4)
Eosinophil %: 0 %
Hematocrit: 21 % — ABNORMAL LOW (ref 34–45)
Hemoglobin: 6.9 g/dL — ABNORMAL LOW (ref 11.2–15.7)
Lymph # K/uL: 1.8 10*3/uL (ref 1.2–3.7)
Lymphocyte %: 10.4 %
MCH: 33 pg/cell — ABNORMAL HIGH (ref 26–32)
MCHC: 32 g/dL (ref 32–36)
MCV: 102 fL — ABNORMAL HIGH (ref 79–95)
Mono # K/uL: 0 10*3/uL — ABNORMAL LOW (ref 0.2–0.9)
Monocyte %: 0 %
Neut # K/uL: 13.1 10*3/uL — ABNORMAL HIGH (ref 1.6–6.1)
Nucl RBC # K/uL: 0.4 10*3/uL — ABNORMAL HIGH (ref 0.0–0.0)
Nucl RBC %: 2.3 /100 WBC — ABNORMAL HIGH (ref 0.0–0.2)
Platelets: 32 10*3/uL — ABNORMAL LOW (ref 160–370)
RBC: 2.1 MIL/uL — ABNORMAL LOW (ref 3.9–5.2)
RDW: 22.6 % — ABNORMAL HIGH (ref 11.7–14.4)
Seg Neut %: 71.4 %
WBC: 17.5 10*3/uL — ABNORMAL HIGH (ref 4.0–10.0)

## 2015-01-31 LAB — COMPREHENSIVE METABOLIC PANEL, PL
ALT, PL: 20 U/L (ref 0–35)
AST, PL: 25 U/L (ref 0–35)
Albumin, PL: 4.2 g/dL (ref 3.5–5.2)
Alk Phos, PL: 53 U/L (ref 35–105)
Anion Gap,PL: 15 (ref 7–16)
Bilirubin Total, PL: 0.3 mg/dL (ref 0.0–1.2)
CO2,Plasma: 23 mmol/L (ref 20–28)
Calcium, PL: 8.9 mg/dL (ref 8.6–10.2)
Chloride,Plasma: 99 mmol/L (ref 96–108)
Creatinine: 0.89 mg/dL (ref 0.51–0.95)
GFR,Black: 85 *
GFR,Caucasian: 73 *
Glucose,Plasma: 165 mg/dL — ABNORMAL HIGH (ref 60–99)
Potassium,Plasma: 4.5 mmol/L (ref 3.4–4.7)
Sodium,Plasma: 137 mmol/L (ref 133–145)
Total Protein, PL: 6.8 g/dL (ref 6.3–7.7)
UN,Plasma: 17 mg/dL (ref 6–20)

## 2015-01-31 LAB — LD, PL: LD, PL: 595 U/L — ABNORMAL HIGH (ref 118–225)

## 2015-01-31 LAB — DIFF MANUAL
Bands %: 4 % (ref 0–10)
Blasts %: 1 % — ABNORMAL HIGH (ref 0–0)
Diff Based On: 115 CELLS
Metamyelocyte %: 2 % — ABNORMAL HIGH (ref 0–1)
Myelocyte %: 8 % — ABNORMAL HIGH (ref 0–0)
Promyelocyte %: 4 % — ABNORMAL HIGH (ref 0–0)

## 2015-01-31 LAB — NEUTROPHIL #-INSTRUMENT: Neutrophil #-Instrument: 11.4 10*3/uL

## 2015-01-31 LAB — RBC MORPHOLOGY

## 2015-01-31 LAB — TYPE AND SCREEN
ABO RH Blood Type: A POS
Antibody Screen: NEGATIVE

## 2015-01-31 MED ORDER — SODIUM CHLORIDE 0.9 % IV SOLN WRAPPED *I*
30.0000 mL/h | Status: DC | PRN
Start: 2015-01-31 — End: 2015-01-31

## 2015-01-31 MED ORDER — ACETAMINOPHEN 325 MG PO TABS *I*
ORAL_TABLET | ORAL | Status: AC
Start: 2015-01-31 — End: 2015-01-31
  Administered 2015-01-31: 650 mg via ORAL
  Filled 2015-01-31: qty 2

## 2015-01-31 MED ORDER — ACETAMINOPHEN 325 MG PO TABS *I*
650.0000 mg | ORAL_TABLET | Freq: Once | ORAL | Status: AC
Start: 2015-01-31 — End: 2015-01-31

## 2015-01-31 NOTE — Progress Notes (Signed)
Patient here for transfusion of  one unit PRBCs for Hct= 21. Tolerated transfusion well, no complications. Positive blood return from PIV before and after transfusion. Next appointment set, AVS provided, discharged to home.

## 2015-01-31 NOTE — Telephone Encounter (Signed)
-----   Message from Assunta Curtis, MD sent at 01/31/2015  7:39 AM EST -----  Needs a transfuson; have her take just one HU per day; labs again later in week.

## 2015-01-31 NOTE — Telephone Encounter (Signed)
Talked to Carly Higgins, I let her know her Hct is 21%. She will get 1 unit of blood today at 2pm and will check her labs again on Thursday.   She states that she has been taking 500mg  per day of hydroxyurea. Good understanding.

## 2015-02-01 ENCOUNTER — Telehealth: Payer: Self-pay | Admitting: Hematology

## 2015-02-01 NOTE — Telephone Encounter (Signed)
Left Carly Higgins a message; She does not have any chemo scheduled this weekend so she does not have to reschedule.

## 2015-02-02 LAB — RED BLOOD CELLS
Coded Blood type: 6200
Component blood type: A POS
Dispense status: TRANSFUSED

## 2015-02-03 ENCOUNTER — Ambulatory Visit
Admission: RE | Admit: 2015-02-03 | Discharge: 2015-02-03 | Disposition: A | Discharge status: Home / Self Care | Payer: Self-pay | Source: Ambulatory Visit | Attending: Hematology | Admitting: Hematology

## 2015-02-03 ENCOUNTER — Telehealth: Payer: Self-pay | Admitting: Hematology

## 2015-02-03 DIAGNOSIS — D471 Chronic myeloproliferative disease: Secondary | ICD-10-CM

## 2015-02-03 DIAGNOSIS — D72829 Elevated white blood cell count, unspecified: Secondary | ICD-10-CM

## 2015-02-03 LAB — CBC AND DIFFERENTIAL
Baso # K/uL: 0 10*3/uL (ref 0.0–0.1)
Basophil %: 0 %
Eos # K/uL: 0 10*3/uL (ref 0.0–0.4)
Eosinophil %: 0 %
Hematocrit: 26 % — ABNORMAL LOW (ref 34–45)
Hemoglobin: 8.4 g/dL — ABNORMAL LOW (ref 11.2–15.7)
Lymph # K/uL: 2.2 10*3/uL (ref 1.2–3.7)
Lymphocyte %: 8 %
MCH: 32 pg/cell (ref 26–32)
MCHC: 32 g/dL (ref 32–36)
MCV: 101 fL — ABNORMAL HIGH (ref 79–95)
Mono # K/uL: 0 10*3/uL — ABNORMAL LOW (ref 0.2–0.9)
Monocyte %: 0 %
Neut # K/uL: 16.7 10*3/uL — ABNORMAL HIGH (ref 1.6–6.1)
Nucl RBC # K/uL: 0.4 10*3/uL — ABNORMAL HIGH (ref 0.0–0.0)
Nucl RBC %: 1.4 /100 WBC — ABNORMAL HIGH (ref 0.0–0.2)
Platelets: 56 10*3/uL — ABNORMAL LOW (ref 160–370)
RBC: 2.6 MIL/uL — ABNORMAL LOW (ref 3.9–5.2)
RDW: 22.5 % — ABNORMAL HIGH (ref 11.7–14.4)
Seg Neut %: 57 %
WBC: 27.3 10*3/uL — ABNORMAL HIGH (ref 4.0–10.0)

## 2015-02-03 LAB — TYPE AND SCREEN
ABO RH Blood Type: A POS
Antibody Screen: NEGATIVE

## 2015-02-03 LAB — DIFF MANUAL
Bands %: 4 % (ref 0–10)
Blasts %: 1 % — ABNORMAL HIGH (ref 0–0)
Diff Based On: 100 CELLS
Metamyelocyte %: 8 % — ABNORMAL HIGH (ref 0–1)
Myelocyte %: 21 % — ABNORMAL HIGH (ref 0–0)
Promyelocyte %: 1 % — ABNORMAL HIGH (ref 0–0)

## 2015-02-03 LAB — COMPREHENSIVE METABOLIC PANEL, PL
ALT, PL: 25 U/L (ref 0–35)
AST, PL: 35 U/L (ref 0–35)
Albumin, PL: 4.3 g/dL (ref 3.5–5.2)
Alk Phos, PL: 59 U/L (ref 35–105)
Anion Gap,PL: 16 (ref 7–16)
Bilirubin Total, PL: 0.5 mg/dL (ref 0.0–1.2)
CO2,Plasma: 25 mmol/L (ref 20–28)
Calcium, PL: 9.8 mg/dL (ref 8.6–10.2)
Chloride,Plasma: 100 mmol/L (ref 96–108)
Creatinine: 0.91 mg/dL (ref 0.51–0.95)
GFR,Black: 82 *
GFR,Caucasian: 71 *
Glucose,Plasma: 180 mg/dL — ABNORMAL HIGH (ref 60–99)
Potassium,Plasma: 4.7 mmol/L (ref 3.4–4.7)
Sodium,Plasma: 141 mmol/L (ref 133–145)
Total Protein, PL: 7.4 g/dL (ref 6.3–7.7)
UN,Plasma: 19 mg/dL (ref 6–20)

## 2015-02-03 LAB — LD, PL: LD, PL: 716 U/L — ABNORMAL HIGH (ref 118–225)

## 2015-02-03 LAB — NEUTROPHIL #-INSTRUMENT: Neutrophil #-Instrument: 16.7 10*3/uL

## 2015-02-03 NOTE — Telephone Encounter (Signed)
Talked to Tramika, Let her know her lab results. She states her fatigue is a lot better. She will start decitabine again next week. She asks that her weekend appointments be earlier. I said I would send the message to infusion but I could not promise anything. Good understanding.

## 2015-02-04 NOTE — Patient Instructions (Signed)
Carly Higgins  [birth year]  Dx: Myeloproliferative Disorder  Treatment: Decitabine 5 days out of 28 days  Labs: every 2 weeks  Antiemetic: [place], RN/[name] [phone]  Updated: 02/04/15          January 2017   Sunday Monday Tuesday Wednesday Thursday Friday Saturday   1     2     3     FOLLOW UP with Dr. Liesveld@    2:45 PM    4     Cycle 2: Day 1   Decitabine@    4:30 PM    5    Decitabine@    4:30 PM    6     Decitabine@    4:30 PM    7     Decitabine@ 12:30 PM   8     Decitabine@   12:00 PM 9     10     11     12     13     14       15     16     17     18     19     20     21       22     23     24     25     26     27     28       29     30     31     FOLLOW UP with Dr. Liesveld@    2:45 PM                                   February 2017   Sunday Monday Tuesday Wednesday Thursday Friday Saturday                  1     Cycle 3: Day 1  Decitabine@    4:00 PM    2     Decitabine@    4:00 PM    3     Decitabine@    4:00 PM    4     Decitabine@    2:00 PM     5   Decitabine@  2:00 PM 6     7     8     9     10     11       12     13     14     15     16     17     18       19     20     21     22     23     24     25       26     27     28   FOLLOW UP with Dr. Liesveld@____                                 March 2017   Sunday Monday Tuesday Wednesday Thursday Friday Saturday                   1  Cycle 4: Day  1  Decitabine@___    2  Decitabine@___    3  Decitabine@___    4  Decitabine@___      5  Decitabine@___    6     7     8     9     10     11       12     13     14     15     16     17     18       19     20     21     22     23     24     25       26     27     28   FOLLOW UP with [name] ___   29  Cycle 5: Day 1  Decitabine@___    30  Decitabine@___    31  Decitabine@___      Decitabine@___               Decitabine (Dacogen) - given intervenously over about 1-2 hour, usually for myelodysplastic syndromes (MDS)    Possible side effects:   fatigue, headache, nausea, constipation, diarrhea, cough,  high blood sugar, fever, or mouth sores.    This medicine may cause bone marrow suppression (low blood counts):   A decrease in white blood cells    increases risk for infections   signs of infection: fever, chills, cough, sore throat, pain or difficulty passing urine    A decrease in red blood cells   causes weakness, feeling tired (fatigued), fainting spells, dizziness or lightheadedness, shortness of breath, or heart palpitations   A decrease in platelets    may cause bleeding, bruising, pinpoint red spots on the skin, nose bleeds, vaginal bleeding, bloody or black, tarry stools, or blood in the urine

## 2015-02-07 ENCOUNTER — Telehealth: Payer: Self-pay

## 2015-02-07 ENCOUNTER — Ambulatory Visit
Admission: RE | Admit: 2015-02-07 | Discharge: 2015-02-07 | Disposition: A | Discharge status: Home / Self Care | Payer: Self-pay | Source: Ambulatory Visit | Attending: Hematology | Admitting: Hematology

## 2015-02-07 DIAGNOSIS — D471 Chronic myeloproliferative disease: Secondary | ICD-10-CM

## 2015-02-07 DIAGNOSIS — D72829 Elevated white blood cell count, unspecified: Secondary | ICD-10-CM

## 2015-02-07 LAB — CBC AND DIFFERENTIAL
Baso # K/uL: 0 10*3/uL (ref 0.0–0.1)
Basophil %: 0 %
Eos # K/uL: 0 10*3/uL (ref 0.0–0.4)
Eosinophil %: 0 %
Hematocrit: 24 % — ABNORMAL LOW (ref 34–45)
Hemoglobin: 7.7 g/dL — ABNORMAL LOW (ref 11.2–15.7)
Lymph # K/uL: 2.4 10*3/uL (ref 1.2–3.7)
Lymphocyte %: 7.8 %
MCH: 32 pg/cell (ref 26–32)
MCHC: 32 g/dL (ref 32–36)
MCV: 101 fL — ABNORMAL HIGH (ref 79–95)
Mono # K/uL: 0 10*3/uL — ABNORMAL LOW (ref 0.2–0.9)
Monocyte %: 0 %
Neut # K/uL: 18.6 10*3/uL — ABNORMAL HIGH (ref 1.6–6.1)
Nucl RBC # K/uL: 0.4 10*3/uL — ABNORMAL HIGH (ref 0.0–0.0)
Nucl RBC %: 1.1 /100 WBC — ABNORMAL HIGH (ref 0.0–0.2)
Platelets: 91 10*3/uL — ABNORMAL LOW (ref 160–370)
RBC: 2.4 MIL/uL — ABNORMAL LOW (ref 3.9–5.2)
RDW: 21.9 % — ABNORMAL HIGH (ref 11.7–14.4)
Seg Neut %: 52.5 %
WBC: 30.5 10*3/uL — ABNORMAL HIGH (ref 4.0–10.0)

## 2015-02-07 LAB — COMPREHENSIVE METABOLIC PANEL, PL
ALT, PL: 20 U/L (ref 0–35)
AST, PL: 31 U/L (ref 0–35)
Albumin, PL: 4.2 g/dL (ref 3.5–5.2)
Alk Phos, PL: 60 U/L (ref 35–105)
Anion Gap,PL: 14 (ref 7–16)
Bilirubin Total, PL: 0.4 mg/dL (ref 0.0–1.2)
CO2,Plasma: 25 mmol/L (ref 20–28)
Calcium, PL: 9.7 mg/dL (ref 8.6–10.2)
Chloride,Plasma: 103 mmol/L (ref 96–108)
Creatinine: 0.93 mg/dL (ref 0.51–0.95)
GFR,Black: 80 *
GFR,Caucasian: 70 *
Glucose,Plasma: 142 mg/dL — ABNORMAL HIGH (ref 60–99)
Potassium,Plasma: 4.9 mmol/L — ABNORMAL HIGH (ref 3.4–4.7)
Sodium,Plasma: 142 mmol/L (ref 133–145)
Total Protein, PL: 7 g/dL (ref 6.3–7.7)
UN,Plasma: 12 mg/dL (ref 6–20)

## 2015-02-07 LAB — DIFF MANUAL
Bands %: 8 % (ref 0–10)
Blasts %: 3 % — ABNORMAL HIGH (ref 0–0)
Diff Based On: 116 CELLS
Metamyelocyte %: 9 % — ABNORMAL HIGH (ref 0–1)
Myelocyte %: 16 % — ABNORMAL HIGH (ref 0–0)
Promyelocyte %: 4 % — ABNORMAL HIGH (ref 0–0)

## 2015-02-07 LAB — TYPE AND SCREEN
ABO RH Blood Type: A POS
Antibody Screen: NEGATIVE

## 2015-02-07 LAB — NEUTROPHIL #-INSTRUMENT: Neutrophil #-Instrument: 16.4 10*3/uL

## 2015-02-07 LAB — LD, PL: LD, PL: 736 U/L — ABNORMAL HIGH (ref 118–225)

## 2015-02-07 NOTE — Telephone Encounter (Signed)
Talked to Carly Higgins, let her know her hct is 24%, plts 91K and wbc 30.5. She states she is doing well, no shortness of breath or chest pain. Her only complaint is pain in her back/buttock, she is taking naproxen and gabapentin. I reminded her of her appointment with [name] tomorrow at 2:45pm. Good understanding.

## 2015-02-08 ENCOUNTER — Ambulatory Visit: Payer: Self-pay | Admitting: Hematology

## 2015-02-08 VITALS — BP 142/64 | HR 94 | Temp 95.9°F | Resp 20 | Ht 65.75 in | Wt 265.4 lb

## 2015-02-08 DIAGNOSIS — C9210 Chronic myeloid leukemia, BCR/ABL-positive, not having achieved remission: Secondary | ICD-10-CM

## 2015-02-08 NOTE — Progress Notes (Signed)
Subjective:      Patient ID: Carly Higgins is a 55 y.o. 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 By [employer] testing, she has mutations of TET2, ASXL1, CCND2, GATA2, SRSF2.       Treatment History:  1.  Decitabine 20 mg/m2 IV daily x 5 days - Has now had 2 cycles.   2.  Intermittent/dose adjusted Hydroxyurea    HPI  Carly Higgins's back pain is much better, and she feels better since we last saw her.  She is now off steroids, and she still takes a naproxen or advil once in a while.  We again discussed the platelet effects of this.  She is ready to start decitabine again today. She will be traveling to [facility] in March to visit her parents.  Energy level is good. Perspires a lot but no true night sweats.  Still smoking and not interested in cessation methods at present.     Active Ambulatory Problems     Diagnosis Date Noted    Leukocytosis 09/22/2013    MPN (myeloproliferative neoplasm) 01/19/2014     Resolved Ambulatory Problems     Diagnosis Date Noted    No Resolved Ambulatory Problems     No Additional Past Medical History        Current Outpatient Prescriptions   Medication Sig Dispense Refill    ibuprofen (ADVIL,MOTRIN) 200 MG tablet Take 200 mg by mouth 3 times daily as needed for Pain      hydroxyurea (HYDREA) 500 MG capsule Take 1 capsule (500 mg total) by mouth 2 times daily 60 capsule 0    methylPREDNISolone (MEDROL PAK) 4 MG tablet pack       traMADol (ULTRAM) 50 MG tablet Take 50 mg by mouth every 6 hours as needed for Pain      cyclobenzaprine (FLEXERIL) 10 MG tablet Take 10 mg by mouth 3 times daily as needed for Muscle spasms      predniSONE (DELTASONE) 10 MG tablet Take 10 mg by mouth daily   As directed      allopurinol (ZYLOPRIM) 300 MG tablet Take 1 tablet (300 mg total) by mouth daily 10 tablet 0    prochlorperazine (COMPAZINE) 10 MG tablet Take 1 tablet (10 mg total) by mouth 4 times daily as needed for Nausea 40 tablet 6     escitalopram (LEXAPRO) 10 MG tablet Take 15 mg by mouth daily       No current facility-administered medications for this visit.          Review of Systems   All other systems reviewed and are negative.    Objective:     Vitals:    02/08/15 1449   BP: 142/64   Pulse: 94   Resp: 20   Temp: 35.5 C (95.9 F)   Weight: 120.4 kg (265 lb 6.9 oz)   Height: 167 cm (5' 5.75")       Laboratory Data:        Lab results: 02/07/15  0722 02/03/15  0706 01/31/15  0710   WBC 30.5* 27.3* 17.5*   HEMOGLOBIN 7.7* 8.4* 6.9*   HEMATOCRIT 24* 26* 21*   RBC 2.4* 2.6* 2.1*   PLATELETS 91* 56* 32*       Physical Exam   Constitutional: Looks well; in good spirits; came from work.   HENT:   Mouth/Throat: Oropharynx is clear and moist. No oropharyngeal exudate.   Eyes: No scleral icterus.   Neck: Neck  supple.   Cardiovascular: Normal rate and regular rhythm.    Pulmonary/Chest: Effort normal and breath sounds normal.   Abdominal: Soft. Bowel sounds are normal. No splenomegaly   Neurological: She is alert and oriented to person, place, and time.   Skin: Skin is warm and dry. No rash noted.   No current pain over spine or at sciatic notch.     Assessment:     Carly Higgins is a 55 year old female with unclassified myeloproliferative disorder.  WBC is better now that she has come off steroids, but given mutational status, it is likely she should proceed to transplant soon.   Plan:     1. To begin next cycle of decitabine tomrorrow;; cycle 3.    2.  Continue Hydroxyurea 500 mg BID and allopurinol 300 mg daily x 10 days. .  3.  Continue weekly blood counts  4.  RTC on 2/28 and to transplant clinic on 3/16.     Cheron Schaumann, MD

## 2015-02-08 NOTE — Patient Instructions (Addendum)
Carly Higgins  [birth year]  Dx: Myeloproliferative Disorder  Treatment: Decitabine 5 days out of 28 days  Labs: every 2 weeks  Antiemetic: [place], RN/[name] [phone]  Updated: 02/04/15    February 2017   Sunday Monday Tuesday Wednesday Thursday Friday Saturday                  1     Cycle 3: Day 1  Decitabine@    4:00 PM    2     Decitabine@    4:00 PM    3     Decitabine@    4:00 PM    4     Decitabine@    2:00 PM     5   Decitabine@  2:00 PM 6     7     8     9     10     11       12     13     14     15     16     17     18       19     20     21     22     23     24     25       26     27     28  FOLLOW UP with Dr. Liesveld@ 2:45PM                                March 2017   Sunday Monday Tuesday Wednesday Thursday Friday Saturday                  1  Cycle 4: Day 1  Decitabine@ 4:00PM  2    Decitabine@ 4:00PM 3    Decitabine@ 4:00PM 4    Decitabine@ 9:00AM   5    Decitabine@ 9:00AM 6     7     8     9     10     11       12     13     14     15     16  Follow up with Dr. Liesveld @ 2:30pm 17     18       19     20     21     22     23     24     25       26     27     28     29   30     31                      Decitabine (Dacogen) - given intervenously over about 1-2 hour, usually for myelodysplastic syndromes (MDS)    Possible side effects:   fatigue, headache, nausea, constipation, diarrhea, cough, high blood sugar, fever, or mouth sores.    This medicine may cause bone marrow suppression (low blood counts):   A decrease in white blood cells    increases risk for infections   signs of infection: fever, chills, cough, sore throat, pain or difficulty passing urine    A decrease in red blood cells   causes weakness, feeling tired (  fatigued), fainting spells, dizziness or lightheadedness, shortness of breath, or heart palpitations   A decrease in platelets    may cause bleeding, bruising, pinpoint red spots on the skin, nose bleeds, vaginal bleeding, bloody or black, tarry stools, or  blood in the urine

## 2015-02-09 ENCOUNTER — Ambulatory Visit: Payer: Self-pay | Admitting: Oncology

## 2015-02-09 ENCOUNTER — Telehealth: Payer: Self-pay | Admitting: Hematology

## 2015-02-09 ENCOUNTER — Ambulatory Visit: Payer: Self-pay

## 2015-02-09 ENCOUNTER — Inpatient Hospital Stay: Payer: Self-pay

## 2015-02-09 VITALS — BP 140/61 | HR 99 | Temp 98.1°F | Resp 18 | Wt 267.8 lb

## 2015-02-09 DIAGNOSIS — D471 Chronic myeloproliferative disease: Secondary | ICD-10-CM

## 2015-02-09 MED ORDER — GRANISETRON HCL 1 MG PO TABS *I*
1.0000 mg | ORAL_TABLET | Freq: Once | ORAL | Status: AC
Start: 2015-02-09 — End: 2015-02-09
  Administered 2015-02-09: 1 mg via ORAL

## 2015-02-09 MED ORDER — GRANISETRON HCL 1 MG PO TABS *I*
ORAL_TABLET | ORAL | Status: AC
Start: 2015-02-09 — End: 2015-02-10
  Filled 2015-02-09: qty 1

## 2015-02-09 MED ORDER — SODIUM CHLORIDE 0.9 % IV SOLN WRAPPED *I*
30.0000 mL/h | Status: DC | PRN
Start: 2015-02-09 — End: 2015-02-09
  Administered 2015-02-09: 30 mL/h via INTRAVENOUS

## 2015-02-09 MED ORDER — SODIUM CHLORIDE 0.9 % IV SOLN WRAPPED *I*
20.0000 mg/m2 | Freq: Once | INTRAVENOUS | Status: AC
Start: 2015-02-09 — End: 2015-02-09
  Administered 2015-02-09: 47 mg via INTRAVENOUS
  Filled 2015-02-09: qty 9.4

## 2015-02-09 NOTE — Telephone Encounter (Signed)
Carly Higgins says she is not ready to stop smoking right now   Has a lot going on  Offered support and encouragement to pursue smoking cessation  States she will reschedule  Stated will forward to team

## 2015-02-09 NOTE — Progress Notes (Signed)
Current consent in chart, labs meet treatment parameters.  Patient received Day 1, cycle 3 Decitabine without any adverse reactions.  Positive blood return noted from patent PIV.  Per patient's request PIV kept in place for tomorrow's treatment.  Reinforced chemotherapy teaching.  Encouraged PO fluids.  Instructed patient to contact clinic for any concers.  For complete details see nursing assessment.  Next appointment made, AVS declined, discharged in the care of self.

## 2015-02-10 ENCOUNTER — Ambulatory Visit: Payer: Self-pay

## 2015-02-10 ENCOUNTER — Inpatient Hospital Stay: Payer: Self-pay

## 2015-02-10 VITALS — BP 116/60 | HR 90 | Temp 98.2°F | Resp 18 | Wt 267.6 lb

## 2015-02-10 DIAGNOSIS — D471 Chronic myeloproliferative disease: Secondary | ICD-10-CM

## 2015-02-10 MED ORDER — SODIUM CHLORIDE 0.9 % IV SOLN WRAPPED *I*
30.0000 mL/h | Status: DC | PRN
Start: 2015-02-10 — End: 2015-02-11
  Administered 2015-02-10: 30 mL/h via INTRAVENOUS

## 2015-02-10 MED ORDER — SODIUM CHLORIDE 0.9 % IV SOLN WRAPPED *I*
20.0000 mg/m2 | Freq: Once | INTRAVENOUS | Status: AC
Start: 2015-02-10 — End: 2015-02-10
  Administered 2015-02-10: 47 mg via INTRAVENOUS
  Filled 2015-02-10: qty 9.4

## 2015-02-10 MED ORDER — GRANISETRON HCL 1 MG PO TABS *I*
1.0000 mg | ORAL_TABLET | Freq: Once | ORAL | Status: AC
Start: 2015-02-10 — End: 2015-02-10

## 2015-02-10 MED ORDER — GRANISETRON HCL 1 MG PO TABS *I*
ORAL_TABLET | ORAL | Status: AC
Start: 2015-02-10 — End: 2015-02-10
  Administered 2015-02-10: 1 mg via ORAL
  Filled 2015-02-10: qty 1

## 2015-02-10 NOTE — Progress Notes (Signed)
Current consent verified and in chart. Labs within treatment parameters. No changes or concerns since treatment yesterday. PIV placed yesterday patent, blood return noted, site benign. Patient tolerated Day 2 Decitabine via PIV without incident. PIV flushed and capped - remains in place for treatment tomorrow. Patient discharged ambulatory to home in stable condition in care of self.

## 2015-02-11 ENCOUNTER — Inpatient Hospital Stay: Payer: Self-pay

## 2015-02-11 ENCOUNTER — Ambulatory Visit: Payer: Self-pay

## 2015-02-11 VITALS — BP 152/65 | HR 87 | Temp 97.0°F | Resp 18 | Wt 268.5 lb

## 2015-02-11 DIAGNOSIS — D471 Chronic myeloproliferative disease: Secondary | ICD-10-CM

## 2015-02-11 MED ORDER — SODIUM CHLORIDE 0.9 % IV SOLN WRAPPED *I*
20.0000 mg/m2 | Freq: Once | INTRAVENOUS | Status: AC
Start: 2015-02-11 — End: 2015-02-11
  Administered 2015-02-11: 47 mg via INTRAVENOUS
  Filled 2015-02-11: qty 9.4

## 2015-02-11 MED ORDER — SODIUM CHLORIDE 0.9 % IV SOLN WRAPPED *I*
30.0000 mL/h | Status: DC | PRN
Start: 2015-02-11 — End: 2015-02-12
  Administered 2015-02-11: 30 mL/h via INTRAVENOUS

## 2015-02-11 MED ORDER — GRANISETRON HCL 1 MG PO TABS *I*
ORAL_TABLET | ORAL | Status: AC
Start: 2015-02-11 — End: 2015-02-11
  Administered 2015-02-11: 1 mg via ORAL
  Filled 2015-02-11: qty 1

## 2015-02-11 MED ORDER — GRANISETRON HCL 1 MG PO TABS *I*
1.0000 mg | ORAL_TABLET | Freq: Once | ORAL | Status: AC
Start: 2015-02-11 — End: 2015-02-11

## 2015-02-11 NOTE — Progress Notes (Signed)
Patient here for IV Decitabine infusion, tolerated well, no complaint. Positive blood return from PIV before and after infusion. AVS provided, next appointment set, discharged to home.

## 2015-02-12 ENCOUNTER — Inpatient Hospital Stay: Payer: Self-pay

## 2015-02-12 ENCOUNTER — Ambulatory Visit: Payer: Self-pay

## 2015-02-12 VITALS — BP 165/78 | HR 97 | Temp 96.3°F | Resp 18 | Wt 266.9 lb

## 2015-02-12 DIAGNOSIS — D471 Chronic myeloproliferative disease: Secondary | ICD-10-CM

## 2015-02-12 MED ORDER — SODIUM CHLORIDE 0.9 % IV SOLN WRAPPED *I*
20.0000 mg/m2 | Freq: Once | INTRAVENOUS | Status: AC
Start: 2015-02-12 — End: 2015-02-12
  Administered 2015-02-12: 47 mg via INTRAVENOUS
  Filled 2015-02-12: qty 9.4

## 2015-02-12 MED ORDER — GRANISETRON HCL 1 MG PO TABS *I*
ORAL_TABLET | ORAL | Status: AC
Start: 2015-02-12 — End: 2015-02-12
  Administered 2015-02-12: 1 mg via ORAL
  Filled 2015-02-12: qty 1

## 2015-02-12 MED ORDER — GRANISETRON HCL 1 MG PO TABS *I*
1.0000 mg | ORAL_TABLET | Freq: Once | ORAL | Status: AC
Start: 2015-02-12 — End: 2015-02-12

## 2015-02-12 MED ORDER — SODIUM CHLORIDE 0.9 % IV SOLN WRAPPED *I*
30.0000 mL/h | Status: DC | PRN
Start: 2015-02-12 — End: 2015-02-12

## 2015-02-12 NOTE — Progress Notes (Signed)
C3 D5 Decitabine given via piv, good blood return pre and post infusion. Next appointment tomorrow.

## 2015-02-13 ENCOUNTER — Ambulatory Visit: Payer: Self-pay

## 2015-02-13 ENCOUNTER — Inpatient Hospital Stay: Payer: Self-pay

## 2015-02-13 VITALS — BP 139/71 | HR 106 | Temp 97.2°F | Resp 18

## 2015-02-13 DIAGNOSIS — D471 Chronic myeloproliferative disease: Secondary | ICD-10-CM

## 2015-02-13 MED ORDER — GRANISETRON HCL 1 MG PO TABS *I*
1.0000 mg | ORAL_TABLET | Freq: Once | ORAL | Status: AC
Start: 2015-02-13 — End: 2015-02-13
  Administered 2015-02-13: 1 mg via ORAL

## 2015-02-13 MED ORDER — SODIUM CHLORIDE 0.9 % IV SOLN WRAPPED *I*
30.0000 mL/h | Status: DC | PRN
Start: 2015-02-13 — End: 2015-02-13
  Administered 2015-02-13: 30 mL/h via INTRAVENOUS

## 2015-02-13 MED ORDER — SODIUM CHLORIDE 0.9 % IV SOLN WRAPPED *I*
20.0000 mg/m2 | Freq: Once | INTRAVENOUS | Status: AC
Start: 2015-02-13 — End: 2015-02-13
  Administered 2015-02-13: 47 mg via INTRAVENOUS
  Filled 2015-02-13: qty 9.4

## 2015-02-13 MED ORDER — GRANISETRON HCL 1 MG PO TABS *I*
ORAL_TABLET | ORAL | Status: DC
Start: 2015-02-13 — End: 2015-02-13
  Filled 2015-02-13: qty 1

## 2015-02-13 NOTE — Progress Notes (Signed)
arrived with piv intact, site clean and dry and blood return noted   received and tolerated Decitabine via piv  discharged home via wheelchair with friend

## 2015-02-14 ENCOUNTER — Ambulatory Visit
Admission: RE | Admit: 2015-02-14 | Discharge: 2015-02-14 | Disposition: A | Discharge status: Home / Self Care | Payer: Self-pay | Source: Ambulatory Visit | Attending: Hematology | Admitting: Hematology

## 2015-02-14 ENCOUNTER — Telehealth: Payer: Self-pay

## 2015-02-14 DIAGNOSIS — D72829 Elevated white blood cell count, unspecified: Secondary | ICD-10-CM

## 2015-02-14 DIAGNOSIS — D471 Chronic myeloproliferative disease: Secondary | ICD-10-CM

## 2015-02-14 LAB — CBC AND DIFFERENTIAL
Baso # K/uL: 0.8 10*3/uL — ABNORMAL HIGH (ref 0.0–0.1)
Basophil %: 1 %
Eos # K/uL: 0.2 10*3/uL (ref 0.0–0.4)
Eosinophil %: 0.3 %
Hematocrit: 25 % — ABNORMAL LOW (ref 34–45)
Hemoglobin: 7.7 g/dL — ABNORMAL LOW (ref 11.2–15.7)
Lymph # K/uL: 3.8 10*3/uL — ABNORMAL HIGH (ref 1.2–3.7)
Lymphocyte %: 4.8 %
MCH: 32 pg/cell (ref 26–32)
MCHC: 31 g/dL — ABNORMAL LOW (ref 32–36)
MCV: 101 fL — ABNORMAL HIGH (ref 79–95)
Mono # K/uL: 0.2 10*3/uL (ref 0.2–0.9)
Monocyte %: 0.3 %
Neut # K/uL: 49.5 10*3/uL — ABNORMAL HIGH (ref 1.6–6.1)
Nucl RBC # K/uL: 0.3 10*3/uL — ABNORMAL HIGH (ref 0.0–0.0)
Nucl RBC %: 0.3 /100 WBC — ABNORMAL HIGH (ref 0.0–0.2)
Platelets: 78 10*3/uL — ABNORMAL LOW (ref 160–370)
RBC: 2.4 MIL/uL — ABNORMAL LOW (ref 3.9–5.2)
RDW: 21.3 % — ABNORMAL HIGH (ref 11.7–14.4)
Seg Neut %: 46 %
WBC: 76.2 10*3/uL — ABNORMAL HIGH (ref 4.0–10.0)

## 2015-02-14 LAB — COMPREHENSIVE METABOLIC PANEL, PL
ALT, PL: 22 U/L (ref 0–35)
AST, PL: 38 U/L — ABNORMAL HIGH (ref 0–35)
Albumin, PL: 4.3 g/dL (ref 3.5–5.2)
Alk Phos, PL: 63 U/L (ref 35–105)
Anion Gap,PL: 13 (ref 7–16)
Bilirubin Total, PL: 0.4 mg/dL (ref 0.0–1.2)
CO2,Plasma: 26 mmol/L (ref 20–28)
Calcium, PL: 9.7 mg/dL (ref 8.6–10.2)
Chloride,Plasma: 103 mmol/L (ref 96–108)
Creatinine: 0.93 mg/dL (ref 0.51–0.95)
GFR,Black: 80 *
GFR,Caucasian: 70 *
Glucose,Plasma: 131 mg/dL — ABNORMAL HIGH (ref 60–99)
Potassium,Plasma: 4.7 mmol/L (ref 3.4–4.7)
Sodium,Plasma: 142 mmol/L (ref 133–145)
Total Protein, PL: 7.1 g/dL (ref 6.3–7.7)
UN,Plasma: 19 mg/dL (ref 6–20)

## 2015-02-14 LAB — DIFF MANUAL
Bands %: 19 % (ref 0–10)
Blasts %: 3 % — ABNORMAL HIGH (ref 0–0)
Diff Based On: 400 CELLS
Metamyelocyte %: 6 % — ABNORMAL HIGH (ref 0–1)
Myelocyte %: 16 % — ABNORMAL HIGH (ref 0–0)
Promyelocyte %: 3 % — ABNORMAL HIGH (ref 0–0)

## 2015-02-14 LAB — TYPE AND SCREEN
ABO RH Blood Type: A POS
Antibody Screen: NEGATIVE

## 2015-02-14 LAB — NEUTROPHIL #-INSTRUMENT: Neutrophil #-Instrument: 45 10*3/uL

## 2015-02-14 LAB — LD, PL: LD, PL: 988 U/L — ABNORMAL HIGH (ref 118–225)

## 2015-02-14 NOTE — Telephone Encounter (Signed)
Critical lab value called in: Band     Name of provider:  Judithe Modest MD     Time provider notified:  [id number]     Time provider responded:  1252     Critical lab and value: Band 19     Action taken as result of notification:  Provider informed

## 2015-02-14 NOTE — Telephone Encounter (Signed)
Talked to [place] and let her know that her Hct is 25%, platelets are 78K. She denies shortness of breath, chest pain. She states she will call if she has any symptoms throughout the week. Good understanding.

## 2015-02-15 ENCOUNTER — Telehealth: Payer: Self-pay | Admitting: Hematology

## 2015-02-15 NOTE — Telephone Encounter (Signed)
Talked to Carly Higgins. She wanted to know if she would feel worse on the chemo for bone marrow transplant then she does with the decitabine.   I let her know that the chemo for transplant would most likely make her feel worse but she should call the number she got for bone marrow transplant team to ask them questions she has because I am not to familiar with the type of chemo or how long it would take her to recover. She stated she would reach out to them. Good understanding.

## 2015-02-16 ENCOUNTER — Telehealth: Payer: Self-pay | Admitting: Hematology

## 2015-02-16 ENCOUNTER — Ambulatory Visit: Payer: Self-pay

## 2015-02-16 NOTE — Telephone Encounter (Signed)
Patient will come to the infusion center tomorrow for 1 unit PRBC at 12 noon.  Patient notified of date and time.

## 2015-02-16 NOTE — Telephone Encounter (Signed)
Called patient's number.  No answer.  Will call again in a few minutes.

## 2015-02-16 NOTE — Telephone Encounter (Signed)
Spoke with Santiago Carly Higgins. Wilburn Carly Higgins is not here today. [name] has extreme fatigue and was in bed @ 4:30 yesterday. States SOB with exertion and going up stairs. She is wondering if she can have a transfusion. Blood work done on 02/14/15. Explained I would reach out to team and call back.

## 2015-02-17 ENCOUNTER — Ambulatory Visit: Payer: Self-pay

## 2015-02-17 VITALS — BP 166/90 | HR 90 | Temp 97.9°F | Resp 18 | Wt 266.3 lb

## 2015-02-17 DIAGNOSIS — D471 Chronic myeloproliferative disease: Secondary | ICD-10-CM

## 2015-02-17 MED ORDER — ACETAMINOPHEN 325 MG PO TABS *I*
650.0000 mg | ORAL_TABLET | Freq: Once | ORAL | Status: AC
Start: 2015-02-17 — End: 2015-02-17

## 2015-02-17 MED ORDER — SODIUM CHLORIDE 0.9 % IV SOLN WRAPPED *I*
30.0000 mL/h | Status: DC | PRN
Start: 2015-02-17 — End: 2015-02-17
  Administered 2015-02-17: 30 mL/h via INTRAVENOUS

## 2015-02-17 MED ORDER — ACETAMINOPHEN 325 MG PO TABS *I*
ORAL_TABLET | ORAL | Status: AC
Start: 2015-02-17 — End: 2015-02-17
  Administered 2015-02-17: 650 mg via ORAL
  Filled 2015-02-17: qty 2

## 2015-02-17 NOTE — Progress Notes (Signed)
Pt tolerated x1unit RBC's well.   AVS given denied at d/c. Next appointment set.   Blood return pre/post treatment per p[iv

## 2015-02-19 LAB — RED BLOOD CELLS
Coded Blood type: 6200
Component blood type: A POS
Dispense status: TRANSFUSED

## 2015-02-21 ENCOUNTER — Telehealth: Payer: Self-pay

## 2015-02-21 ENCOUNTER — Ambulatory Visit: Payer: Self-pay

## 2015-02-21 ENCOUNTER — Ambulatory Visit
Admission: RE | Admit: 2015-02-21 | Discharge: 2015-02-21 | Disposition: A | Discharge status: Home / Self Care | Payer: Self-pay | Source: Ambulatory Visit | Attending: Hematology | Admitting: Hematology

## 2015-02-21 VITALS — BP 117/48 | HR 79 | Temp 98.1°F | Resp 18 | Wt 266.9 lb

## 2015-02-21 DIAGNOSIS — D471 Chronic myeloproliferative disease: Secondary | ICD-10-CM

## 2015-02-21 DIAGNOSIS — D72829 Elevated white blood cell count, unspecified: Secondary | ICD-10-CM

## 2015-02-21 LAB — CBC AND DIFFERENTIAL
Baso # K/uL: 0 10*3/uL (ref 0.0–0.1)
Basophil %: 0 %
Eos # K/uL: 0 10*3/uL (ref 0.0–0.4)
Eosinophil %: 0 %
Hematocrit: 23 % — ABNORMAL LOW (ref 34–45)
Hemoglobin: 7.5 g/dL — ABNORMAL LOW (ref 11.2–15.7)
Lymph # K/uL: 2.6 10*3/uL (ref 1.2–3.7)
Lymphocyte %: 6.6 %
MCH: 32 pg/cell (ref 26–32)
MCHC: 32 g/dL (ref 32–36)
MCV: 98 fL — ABNORMAL HIGH (ref 79–95)
Mono # K/uL: 0 10*3/uL — ABNORMAL LOW (ref 0.2–0.9)
Monocyte %: 0 %
Neut # K/uL: 29.3 10*3/uL — ABNORMAL HIGH (ref 1.6–6.1)
Nucl RBC # K/uL: 0.1 10*3/uL — ABNORMAL HIGH (ref 0.0–0.0)
Nucl RBC %: 0.2 /100 WBC (ref 0.0–0.2)
Platelets: 29 10*3/uL — ABNORMAL LOW (ref 160–370)
RBC: 2.4 MIL/uL — ABNORMAL LOW (ref 3.9–5.2)
RDW: 19.9 % — ABNORMAL HIGH (ref 11.7–14.4)
Seg Neut %: 71 %
WBC: 36.6 10*3/uL — ABNORMAL HIGH (ref 4.0–10.0)

## 2015-02-21 LAB — COMPREHENSIVE METABOLIC PANEL, PL
ALT, PL: 20 U/L (ref 0–35)
AST, PL: 30 U/L (ref 0–35)
Albumin, PL: 4.3 g/dL (ref 3.5–5.2)
Alk Phos, PL: 68 U/L (ref 35–105)
Anion Gap,PL: 16 (ref 7–16)
Bilirubin Total, PL: 0.5 mg/dL (ref 0.0–1.2)
CO2,Plasma: 22 mmol/L (ref 20–28)
Calcium, PL: 9.4 mg/dL (ref 8.6–10.2)
Chloride,Plasma: 102 mmol/L (ref 96–108)
Creatinine: 0.74 mg/dL (ref 0.51–0.95)
GFR,Black: 106 *
GFR,Caucasian: 92 *
Glucose,Plasma: 151 mg/dL — ABNORMAL HIGH (ref 60–99)
Potassium,Plasma: 4.2 mmol/L (ref 3.4–4.7)
Sodium,Plasma: 140 mmol/L (ref 133–145)
Total Protein, PL: 7 g/dL (ref 6.3–7.7)
UN,Plasma: 16 mg/dL (ref 6–20)

## 2015-02-21 LAB — TYPE AND SCREEN
ABO RH Blood Type: A POS
Antibody Screen: NEGATIVE

## 2015-02-21 LAB — DIFF MANUAL
Bands %: 9 % (ref 0–10)
Blasts %: 2 % — ABNORMAL HIGH (ref 0–0)
Diff Based On: 121 CELLS
Metamyelocyte %: 2 % — ABNORMAL HIGH (ref 0–1)
Myelocyte %: 10 % — ABNORMAL HIGH (ref 0–0)

## 2015-02-21 LAB — LD, PL: LD, PL: 552 U/L — ABNORMAL HIGH (ref 118–225)

## 2015-02-21 LAB — NEUTROPHIL #-INSTRUMENT: Neutrophil #-Instrument: 25.9 10*3/uL

## 2015-02-21 MED ORDER — ACETAMINOPHEN 325 MG PO TABS *I*
650.0000 mg | ORAL_TABLET | Freq: Once | ORAL | Status: AC
Start: 2015-02-21 — End: 2015-02-21
  Administered 2015-02-21: 650 mg via ORAL

## 2015-02-21 MED ORDER — ACETAMINOPHEN 325 MG PO TABS *I*
ORAL_TABLET | ORAL | Status: DC
Start: 2015-02-21 — End: 2015-02-21
  Filled 2015-02-21: qty 2

## 2015-02-21 MED ORDER — SODIUM CHLORIDE 0.9 % IV SOLN WRAPPED *I*
30.0000 mL/h | Status: DC | PRN
Start: 2015-02-21 — End: 2015-02-21

## 2015-02-21 NOTE — Progress Notes (Signed)
Pt tolerated x1unit RBC's well.   AVS denied at d/c. Next appointment set.   Blood return pre/post treatment per piv.

## 2015-02-21 NOTE — Telephone Encounter (Signed)
Patient will get 1 unit of blood. I also informed her per [name] to stop taking hydroxyurea.   Good understanding.

## 2015-02-21 NOTE — Telephone Encounter (Signed)
Talked to Carly Higgins, she feels really fatigued. I let her know that her Hct is 23% and she will need 1 unit of blood. I made her appointment at 3pm but she states she is leaving work early today and is asking if it can be earlier, like around 10am. I told her I would talk with the infusion schedulers and call her back. She also states she is taking 500mg  of hydroxyurea, but she missed a few days. Good understanding.

## 2015-02-22 LAB — RED BLOOD CELLS
Coded Blood type: 6200
Component blood type: A POS
Dispense status: TRANSFUSED

## 2015-02-24 ENCOUNTER — Telehealth: Payer: Self-pay | Admitting: Hematology

## 2015-02-24 NOTE — Telephone Encounter (Signed)
Carly Higgins wanted to talk about getting a handicap sticker due to her severe fatigue related to anemia from her diagnosis and treatment.   I let her know that there is paperwork [name] would sign and then she would take it to the [facility] to get the sticker. I let her know that I would call her when the form is signed. Good understanding.

## 2015-02-28 ENCOUNTER — Ambulatory Visit
Admission: RE | Admit: 2015-02-28 | Discharge: 2015-02-28 | Disposition: A | Discharge status: Home / Self Care | Payer: Self-pay | Source: Ambulatory Visit | Attending: Hematology | Admitting: Hematology

## 2015-02-28 ENCOUNTER — Telehealth: Payer: Self-pay

## 2015-02-28 ENCOUNTER — Telehealth: Payer: Self-pay | Admitting: Hematology

## 2015-02-28 DIAGNOSIS — D72829 Elevated white blood cell count, unspecified: Secondary | ICD-10-CM

## 2015-02-28 DIAGNOSIS — D471 Chronic myeloproliferative disease: Secondary | ICD-10-CM

## 2015-02-28 LAB — CBC AND DIFFERENTIAL
Baso # K/uL: 0 10*3/uL (ref 0.0–0.1)
Basophil %: 0 %
Eos # K/uL: 0 10*3/uL (ref 0.0–0.4)
Eosinophil %: 0 %
Hematocrit: 26 % — ABNORMAL LOW (ref 34–45)
Hemoglobin: 8.2 g/dL — ABNORMAL LOW (ref 11.2–15.7)
Lymph # K/uL: 2.2 10*3/uL (ref 1.2–3.7)
Lymphocyte %: 4.2 %
MCH: 31 pg/cell (ref 26–32)
MCHC: 32 g/dL (ref 32–36)
MCV: 97 fL — ABNORMAL HIGH (ref 79–95)
Mono # K/uL: 0 10*3/uL — ABNORMAL LOW (ref 0.2–0.9)
Monocyte %: 0 %
Neut # K/uL: 32.9 10*3/uL — ABNORMAL HIGH (ref 1.6–6.1)
Nucl RBC # K/uL: 1.1 10*3/uL — ABNORMAL HIGH (ref 0.0–0.0)
Nucl RBC %: 2 /100 WBC — ABNORMAL HIGH (ref 0.0–0.2)
Platelets: 47 10*3/uL — ABNORMAL LOW (ref 160–370)
RBC: 2.7 MIL/uL — ABNORMAL LOW (ref 3.9–5.2)
RDW: 21 % — ABNORMAL HIGH (ref 11.7–14.4)
Seg Neut %: 50 %
WBC: 53.9 10*3/uL — ABNORMAL HIGH (ref 4.0–10.0)

## 2015-02-28 LAB — COMPREHENSIVE METABOLIC PANEL, PL
ALT, PL: 17 U/L (ref 0–35)
AST, PL: 40 U/L — ABNORMAL HIGH (ref 0–35)
Albumin, PL: 4.4 g/dL (ref 3.5–5.2)
Alk Phos, PL: 78 U/L (ref 35–105)
Anion Gap,PL: 13 (ref 7–16)
Bilirubin Total, PL: 0.4 mg/dL (ref 0.0–1.2)
CO2,Plasma: 26 mmol/L (ref 20–28)
Calcium, PL: 9.7 mg/dL (ref 8.6–10.2)
Chloride,Plasma: 101 mmol/L (ref 96–108)
Creatinine: 0.91 mg/dL (ref 0.51–0.95)
GFR,Black: 82 *
GFR,Caucasian: 71 *
Glucose,Plasma: 118 mg/dL — ABNORMAL HIGH (ref 60–99)
Potassium,Plasma: 4.8 mmol/L — ABNORMAL HIGH (ref 3.4–4.7)
Sodium,Plasma: 140 mmol/L (ref 133–145)
Total Protein, PL: 7.4 g/dL (ref 6.3–7.7)
UN,Plasma: 14 mg/dL (ref 6–20)

## 2015-02-28 LAB — TYPE AND SCREEN
ABO RH Blood Type: A POS
Antibody Screen: NEGATIVE

## 2015-02-28 LAB — DIFF MANUAL
Bands %: 11 % (ref 0–10)
Blasts %: 2 % — ABNORMAL HIGH (ref 0–0)
Diff Based On: 120 CELLS
Metamyelocyte %: 3 % — ABNORMAL HIGH (ref 0–1)
Myelocyte %: 31 % — ABNORMAL HIGH (ref 0–0)

## 2015-02-28 LAB — NEUTROPHIL #-INSTRUMENT: Neutrophil #-Instrument: 30.2 10*3/uL

## 2015-02-28 LAB — LD, PL: LD, PL: 922 U/L — ABNORMAL HIGH (ref 118–225)

## 2015-02-28 NOTE — Telephone Encounter (Signed)
Talked to Carly Higgins, let her know that her Hct is 26% and platlets are 47K. She states her fatigue is better.   She is going to drop off FMLA papers for our office to fill out and [name] to sign and then she would like the papers back.   Good understanding.

## 2015-02-28 NOTE — Telephone Encounter (Signed)
Talked to Carly Higgins and let her know her disease is call Myeloproliferative disorders.

## 2015-02-28 NOTE — Telephone Encounter (Signed)
Critical lab value called in:     Name of provider:Liesveld/Likly     Time provider notified:     Time provider responded:       Critical lab and value:Bands 11%     Action taken as result of notification:Provider paged and Claiborne Billings Likely aware

## 2015-03-01 ENCOUNTER — Telehealth: Payer: Self-pay | Admitting: Hematology

## 2015-03-03 ENCOUNTER — Telehealth: Payer: Self-pay | Admitting: Hematology

## 2015-03-03 NOTE — Telephone Encounter (Signed)
Talked to Carly Higgins, she is feeling tired and has shortness of breath. She thinks she will need a transfusion tomorrow. I told her to get blood work done when she goes into work and I will see what her labs are and make an appointment.   Good understanding. She also asked about the Shingles vaccine. I informed her that it could be a possibility but when she has a bone marrow transplant she will need new vaccines.

## 2015-03-04 ENCOUNTER — Telehealth: Payer: Self-pay

## 2015-03-04 ENCOUNTER — Ambulatory Visit
Admission: RE | Admit: 2015-03-04 | Discharge: 2015-03-04 | Disposition: A | Discharge status: Home / Self Care | Payer: Self-pay | Source: Ambulatory Visit | Attending: Hematology | Admitting: Hematology

## 2015-03-04 DIAGNOSIS — D471 Chronic myeloproliferative disease: Secondary | ICD-10-CM

## 2015-03-04 DIAGNOSIS — D72829 Elevated white blood cell count, unspecified: Secondary | ICD-10-CM

## 2015-03-04 LAB — CBC AND DIFFERENTIAL
Baso # K/uL: 0 10*3/uL (ref 0.0–0.1)
Basophil %: 0 %
Eos # K/uL: 0 10*3/uL (ref 0.0–0.4)
Eosinophil %: 0 %
Hematocrit: 26 % — ABNORMAL LOW (ref 34–45)
Hemoglobin: 8.3 g/dL — ABNORMAL LOW (ref 11.2–15.7)
Lymph # K/uL: 3.3 10*3/uL (ref 1.2–3.7)
Lymphocyte %: 5.1 %
MCH: 30 pg/cell (ref 26–32)
MCHC: 32 g/dL (ref 32–36)
MCV: 96 fL — ABNORMAL HIGH (ref 79–95)
Mono # K/uL: 0 10*3/uL — ABNORMAL LOW (ref 0.2–0.9)
Monocyte %: 0 %
Neut # K/uL: 44.5 10*3/uL — ABNORMAL HIGH (ref 1.6–6.1)
Nucl RBC # K/uL: 1.8 10*3/uL — ABNORMAL HIGH (ref 0.0–0.0)
Nucl RBC %: 2.7 /100 WBC — ABNORMAL HIGH (ref 0.0–0.2)
Platelets: 60 10*3/uL — ABNORMAL LOW (ref 160–370)
RBC: 2.7 MIL/uL — ABNORMAL LOW (ref 3.9–5.2)
RDW: 21.1 % — ABNORMAL HIGH (ref 11.7–14.4)
Seg Neut %: 53.4 %
WBC: 66.4 10*3/uL — ABNORMAL HIGH (ref 4.0–10.0)

## 2015-03-04 LAB — LD, PL: LD, PL: 1266 U/L — ABNORMAL HIGH (ref 118–225)

## 2015-03-04 LAB — COMPREHENSIVE METABOLIC PANEL, PL
ALT, PL: 19 U/L (ref 0–35)
AST, PL: 48 U/L — ABNORMAL HIGH (ref 0–35)
Albumin, PL: 4.4 g/dL (ref 3.5–5.2)
Alk Phos, PL: 74 U/L (ref 35–105)
Anion Gap,PL: 15 (ref 7–16)
Bilirubin Total, PL: 0.5 mg/dL (ref 0.0–1.2)
CO2,Plasma: 26 mmol/L (ref 20–28)
Calcium, PL: 9.5 mg/dL (ref 8.6–10.2)
Chloride,Plasma: 100 mmol/L (ref 96–108)
Creatinine: 0.85 mg/dL (ref 0.51–0.95)
GFR,Black: 89 *
GFR,Caucasian: 78 *
Glucose,Plasma: 148 mg/dL — ABNORMAL HIGH (ref 60–99)
Potassium,Plasma: 4.6 mmol/L (ref 3.4–4.7)
Sodium,Plasma: 141 mmol/L (ref 133–145)
Total Protein, PL: 7.6 g/dL (ref 6.3–7.7)
UN,Plasma: 11 mg/dL (ref 6–20)

## 2015-03-04 LAB — TYPE AND SCREEN
ABO RH Blood Type: A POS
Antibody Screen: NEGATIVE

## 2015-03-04 LAB — DIFF MANUAL
Bands %: 14 % (ref 0–10)
Blasts %: 2 % — ABNORMAL HIGH (ref 0–0)
Diff Based On: 118 CELLS
Metamyelocyte %: 8 % — ABNORMAL HIGH (ref 0–1)
Myelocyte %: 16 % — ABNORMAL HIGH (ref 0–0)
Promyelocyte %: 3 % — ABNORMAL HIGH (ref 0–0)

## 2015-03-04 LAB — NEUTROPHIL #-INSTRUMENT: Neutrophil #-Instrument: 36.5 10*3/uL

## 2015-03-04 NOTE — Telephone Encounter (Signed)
Talked to Carly Higgins, let her know her Hct is 26% and she does not need a transfusion.   She states she feels a lot better today, no shortness of breath.

## 2015-03-04 NOTE — Telephone Encounter (Signed)
Critical lab value called in:  Band %    Additional: WBC/Hct/Plt     Name of provider:  K. Likely, NP     Time provider [id number]     Time provider responded:  [id number]     Critical lab and value:  Band % 14        WBC 66  Hct  26  Plt  66     Action taken as result of notification:Prvider notified and informed

## 2015-03-07 ENCOUNTER — Telehealth: Payer: Self-pay

## 2015-03-07 ENCOUNTER — Ambulatory Visit
Admission: RE | Admit: 2015-03-07 | Discharge: 2015-03-07 | Disposition: A | Discharge status: Home / Self Care | Payer: Self-pay | Source: Ambulatory Visit | Attending: Hematology | Admitting: Hematology

## 2015-03-07 DIAGNOSIS — D72829 Elevated white blood cell count, unspecified: Secondary | ICD-10-CM

## 2015-03-07 DIAGNOSIS — D471 Chronic myeloproliferative disease: Secondary | ICD-10-CM

## 2015-03-07 LAB — CBC AND DIFFERENTIAL
Baso # K/uL: 0 10*3/uL (ref 0.0–0.1)
Basophil %: 0 %
Eos # K/uL: 0 10*3/uL (ref 0.0–0.4)
Eosinophil %: 0 %
Hematocrit: 25 % — ABNORMAL LOW (ref 34–45)
Hemoglobin: 8.1 g/dL — ABNORMAL LOW (ref 11.2–15.7)
Lymph # K/uL: 1.3 10*3/uL (ref 1.2–3.7)
Lymphocyte %: 1.7 %
MCH: 31 pg/cell (ref 26–32)
MCHC: 32 g/dL (ref 32–36)
MCV: 96 fL — ABNORMAL HIGH (ref 79–95)
Mono # K/uL: 0 10*3/uL — ABNORMAL LOW (ref 0.2–0.9)
Monocyte %: 0 %
Neut # K/uL: 41.4 10*3/uL — ABNORMAL HIGH (ref 1.6–6.1)
Nucl RBC # K/uL: 1.7 10*3/uL — ABNORMAL HIGH (ref 0.0–0.0)
Nucl RBC %: 2.5 /100 WBC — ABNORMAL HIGH (ref 0.0–0.2)
Platelets: 69 10*3/uL — ABNORMAL LOW (ref 160–370)
RBC: 2.7 MIL/uL — ABNORMAL LOW (ref 3.9–5.2)
RDW: 20.8 % — ABNORMAL HIGH (ref 11.7–14.4)
Seg Neut %: 47.8 %
WBC: 66.7 10*3/uL — ABNORMAL HIGH (ref 4.0–10.0)

## 2015-03-07 LAB — DIFF MANUAL
Bands %: 14 % (ref 0–10)
Blasts %: 3 % — ABNORMAL HIGH (ref 0–0)
Diff Based On: 115 CELLS
Metamyelocyte %: 7 % — ABNORMAL HIGH (ref 0–1)
Myelocyte %: 27 % — ABNORMAL HIGH (ref 0–0)

## 2015-03-07 LAB — COMPREHENSIVE METABOLIC PANEL, PL
ALT, PL: 19 U/L (ref 0–35)
AST, PL: 59 U/L — ABNORMAL HIGH (ref 0–35)
Albumin, PL: 4.5 g/dL (ref 3.5–5.2)
Alk Phos, PL: 74 U/L (ref 35–105)
Anion Gap,PL: 15 (ref 7–16)
Bilirubin Total, PL: 0.5 mg/dL (ref 0.0–1.2)
CO2,Plasma: 24 mmol/L (ref 20–28)
Calcium, PL: 9.7 mg/dL (ref 8.6–10.2)
Chloride,Plasma: 98 mmol/L (ref 96–108)
Creatinine: 0.88 mg/dL (ref 0.51–0.95)
GFR,Black: 86 *
GFR,Caucasian: 74 *
Glucose,Plasma: 177 mg/dL — ABNORMAL HIGH (ref 60–99)
Potassium,Plasma: 4.6 mmol/L (ref 3.4–4.7)
Sodium,Plasma: 137 mmol/L (ref 133–145)
Total Protein, PL: 7.2 g/dL (ref 6.3–7.7)
UN,Plasma: 13 mg/dL (ref 6–20)

## 2015-03-07 LAB — TYPE AND SCREEN
ABO RH Blood Type: A POS
Antibody Screen: NEGATIVE

## 2015-03-07 LAB — LD, PL: LD, PL: 1361 U/L — ABNORMAL HIGH (ref 118–225)

## 2015-03-07 LAB — NEUTROPHIL #-INSTRUMENT: Neutrophil #-Instrument: 36.1 10*3/uL

## 2015-03-07 NOTE — Patient Instructions (Signed)
Carly Higgins  [birth year]  Dx: Myeloproliferative Disorder  Treatment: Decitabine 5 days out of 28 days  Labs: every 2 weeks  Antiemetic: [place], RN/[name] [phone]  Updated: 03/07/15    February 2017   Sunday Monday Tuesday Wednesday Thursday Friday Saturday                  1     Cycle 3: Day 1  Decitabine@    4:00 PM    2     Decitabine@    4:00 PM    3     Decitabine@    4:00 PM    4     Decitabine@    2:00 PM     5   Decitabine@  2:00 PM 6     7     8     9     10     11       12     13     14     15     16     17     18       19     20     21     22     23     24     25       26     27     28  FOLLOW UP with Dr. Liesveld@ 2:45PM                                March 2017   Sunday Monday Tuesday Wednesday Thursday Friday Saturday                  1  Cycle 4: Day 1  Decitabine@ 4:00PM  2    Decitabine@ 4:00PM 3    Decitabine@ 4:00PM 4    Decitabine@ 9:00AM   5    Decitabine@ 9:00AM 6     7     8     9     10     11       12     13     14     15     16  Follow up with Dr. Liesveld @ 2:30pm    ?2PRBCs/?plts@____   17  Vacation till ??   18       19     20     21     22     23     24     25       26     27     28   FOLLOW UP with [name] ___   29  Cycle 5: Day 1  Decitabine@ ___   30    Decitabine@ ___ 31    Decitabine@ ___               April 2017   Sunday Monday Tuesday Wednesday Thursday Friday Saturday                                  1  Decitabine@ ___     2  Decitabine@ ___   3     4     5  6     7     8       9     10     11     12     13     14     15       16     17     18     19     20     21     22       23     24     25     26     27     28     29       30                                                 Decitabine (Dacogen) - given intervenously over about 1-2 hour, usually for myelodysplastic syndromes (MDS)    Possible side effects:   fatigue, headache, nausea, constipation, diarrhea, cough, high blood sugar, fever, or mouth sores.    This medicine may cause bone marrow  suppression (low blood counts):   A decrease in white blood cells    increases risk for infections   signs of infection: fever, chills, cough, sore throat, pain or difficulty passing urine    A decrease in red blood cells   causes weakness, feeling tired (fatigued), fainting spells, dizziness or lightheadedness, shortness of breath, or heart palpitations   A decrease in platelets    may cause bleeding, bruising, pinpoint red spots on the skin, nose bleeds, vaginal bleeding, bloody or black, tarry stools, or blood in the urine

## 2015-03-07 NOTE — Telephone Encounter (Signed)
Critical lab value called in:  WBC/Band     Name of provider:  K. Likely NP     Time provider notified: [phone]     Time provider responded:  [phone]     Critical lab and value:  Wbc  66.7    Band  14     Action taken as result of notification:  Provider notified and informed

## 2015-03-07 NOTE — Telephone Encounter (Signed)
Talked to Carly Higgins, let her know her lab work. Reminded her of appointment with [name] tomorrow at 2:45pm.   Good understanding.

## 2015-03-08 ENCOUNTER — Ambulatory Visit: Payer: Self-pay | Admitting: Hematology

## 2015-03-08 VITALS — BP 136/79 | HR 100 | Temp 95.5°F | Resp 18 | Ht 65.75 in | Wt 264.6 lb

## 2015-03-08 DIAGNOSIS — D471 Chronic myeloproliferative disease: Secondary | ICD-10-CM

## 2015-03-08 MED ORDER — HYDROXYUREA 500 MG PO CAPS *I*
500.0000 mg | ORAL_CAPSULE | Freq: Every day | ORAL | 6 refills | Status: DC
Start: 2015-03-08 — End: 2015-06-06

## 2015-03-08 NOTE — Patient Instructions (Addendum)
Carly Higgins  [birth year]  Dx: Myeloproliferative Disorder  Treatment: Decitabine 5 days out of 28 days  Labs: every 2 weeks  Antiemetic: [place], RN/[name] [phone]  Updated: 03/07/15    February 2017   Sunday Monday Tuesday Wednesday Thursday Friday Saturday                  1     Cycle 3: Day 1  Decitabine@    4:00 PM    2     Decitabine@    4:00 PM    3     Decitabine@    4:00 PM    4     Decitabine@    2:00 PM     5   Decitabine@  2:00 PM 6     7     8     9     10     11       12     13     14     15     16     17     18       19     20     21     22     23     24     25       26     27     28  FOLLOW UP with Dr. Liesveld@ 2:45PM                                March 2017   Sunday Monday Tuesday Wednesday Thursday Friday Saturday                  1  Cycle 4: Day 1  Decitabine@ 4:00PM  2    Decitabine@ 4:00PM 3    Decitabine@ 4:00PM 4    Decitabine@ 9:00AM   5    Decitabine@ 9:00AM 6     7     8     9     10     11       12     13     14     15     16  Follow up with Dr. Liesveld @ 2:30pm    ?2PRBCs/?plts@____   17  Vacation till 4/3   18       19     20     21     22     23     24     25       26     27     28     29   30     31                April 2017   Sunday Monday Tuesday Wednesday Thursday Friday Saturday                                  1       2   3     4   FOLLOW UP with [name] ___   5  Cycle 5: Day 1  Decitabine@ ___     6  Decitabine@ ___   7  Decitabine@ ___  8  Decitabine@ ___     9  Decitabine@ ___   10     11     12     13     14     15       16     17     18     19     20     21     22       23     24     25     26     27     28     29       30                                                 Decitabine (Dacogen) - given intervenously over about 1-2 hour, usually for myelodysplastic syndromes (MDS)    Possible side effects:   fatigue, headache, nausea, constipation, diarrhea, cough, high blood sugar, fever, or mouth sores.    This medicine may cause bone marrow  suppression (low blood counts):   A decrease in white blood cells    increases risk for infections   signs of infection: fever, chills, cough, sore throat, pain or difficulty passing urine    A decrease in red blood cells   causes weakness, feeling tired (fatigued), fainting spells, dizziness or lightheadedness, shortness of breath, or heart palpitations   A decrease in platelets    may cause bleeding, bruising, pinpoint red spots on the skin, nose bleeds, vaginal bleeding, bloody or black, tarry stools, or blood in the urine

## 2015-03-08 NOTE — Progress Notes (Signed)
Subjective:      Patient ID: SATINE HAUSNER is a 55 y.o. 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 By [employer] testing, she has mutations of TET2, ASXL1, CCND2, GATA2, SRSF2.       Treatment History:  1.  Decitabine 20 mg/m2 IV daily x 5 days - Has now had 3 cycles. Will have one more cycle before she goes to [place].   2.  Intermittent/dose adjusted Hydroxyurea    HPI  Xyla's back pain is much better, and she feels better since we last saw her.  She is now off steroids, and she still takes a naproxen or advil once in a while.    She is ready to start decitabine again tomorrow. She will be traveling to [facility] in March to visit her parents. We will see her on the 16th before she goes to work on plans for marrow transplant.  Energy level is good. Perspires a lot but no true night sweats.  Still smoking and not interested in cessation methods at present. Weight loss has plateaued.     Active Ambulatory Problems     Diagnosis Date Noted    Leukocytosis 09/22/2013    MPN (myeloproliferative neoplasm) 01/19/2014     Resolved Ambulatory Problems     Diagnosis Date Noted    No Resolved Ambulatory Problems     No Additional Past Medical History        Current Outpatient Prescriptions   Medication Sig Dispense Refill    naproxen (NAPROSYN) 500 MG tablet       cyclobenzaprine (FLEXERIL) 10 MG tablet Take 10 mg by mouth 3 times daily as needed for Muscle spasms      ibuprofen (ADVIL,MOTRIN) 200 MG tablet Take 200 mg by mouth 3 times daily as needed for Pain      prochlorperazine (COMPAZINE) 10 MG tablet Take 1 tablet (10 mg total) by mouth 4 times daily as needed for Nausea 40 tablet 6    escitalopram (LEXAPRO) 10 MG tablet Take 15 mg by mouth daily      hydroxyurea (HYDREA) 500 MG capsule Take 1 capsule (500 mg total) by mouth daily 30 capsule 6    allopurinol (ZYLOPRIM) 300 MG tablet Take 1 tablet (300 mg total) by mouth daily 10 tablet 0     No  current facility-administered medications for this visit.          Review of Systems   All other systems reviewed and are negative.    Objective:     Vitals:    03/08/15 1445   BP: 136/79   Pulse: 100   Resp: 18   Temp: 35.3 C (95.5 F)   Weight: 120 kg (264 lb 8.8 oz)   Height: 167 cm (5' 5.75")       Laboratory Data:        Lab results: 03/07/15  0713 03/04/15  0701 02/28/15  0917   WBC 66.7* 66.4* 53.9*   Hemoglobin 8.1* 8.3* 8.2*   Hematocrit 25* 26* 26*   RBC 2.7* 2.7* 2.7*   Platelets 69* 60* 47*       Physical Exam   Constitutional: Looks well; in good spirits;  HENT:   Mouth/Throat: Oropharynx is clear and moist. No oropharyngeal exudate.   Eyes: No scleral icterus.   Neck: Neck supple.   Cardiovascular: Normal rate and regular rhythm.    Pulmonary/Chest: Effort normal and breath sounds normal.   Abdominal: Soft. Bowel sounds are  normal. No splenomegaly detectable   Neurological: She is alert and oriented to person, place, and time.   Skin: Skin is warm and dry. No rash noted.   No current pain over spine or at sciatic notch.     Assessment:     Ms. Freestone is a 55 year old female with unclassified myeloproliferative disorder.  WBC remains quite elevated, but given mutational status she  should proceed to transplant soon.   Plan:     1. To begin next cycle of decitabine tomrorrow;; cycle 4.    2.  To restart Hydroxyurea 500 mg per day  3.  Continue weekly blood counts  4.  RTC  to transplant clinic on 3/16. Will aim for transplant in late April/early May when her parents are back from [facility].  Has had PFTs and ECHO.  Will do marrow again before transplant.   She also met with Verlin Fester, BMT coordinator today .    Cheron Schaumann, MD

## 2015-03-08 NOTE — Addendum Note (Signed)
Addended by: Maylon Peppers on: 03/08/2015 04:48 PM     Modules accepted: Orders

## 2015-03-09 ENCOUNTER — Ambulatory Visit: Payer: Self-pay

## 2015-03-09 VITALS — BP 149/73 | HR 84 | Temp 96.8°F | Resp 18 | Wt 264.9 lb

## 2015-03-09 DIAGNOSIS — D471 Chronic myeloproliferative disease: Secondary | ICD-10-CM

## 2015-03-09 MED ORDER — SODIUM CHLORIDE 0.9 % IV SOLN WRAPPED *I*
20.0000 mg/m2 | Freq: Once | INTRAVENOUS | Status: AC
Start: 2015-03-09 — End: 2015-03-09
  Administered 2015-03-09: 47 mg via INTRAVENOUS
  Filled 2015-03-09: qty 9.4

## 2015-03-09 MED ORDER — GRANISETRON HCL 1 MG PO TABS *I*
1.0000 mg | ORAL_TABLET | Freq: Once | ORAL | Status: AC
Start: 2015-03-09 — End: 2015-03-09
  Administered 2015-03-09: 1 mg via ORAL

## 2015-03-09 MED ORDER — GRANISETRON HCL 1 MG PO TABS *I*
ORAL_TABLET | ORAL | Status: AC
Start: 2015-03-09 — End: 2015-03-10
  Filled 2015-03-09: qty 1

## 2015-03-09 MED ORDER — SODIUM CHLORIDE 0.9 % IV SOLN WRAPPED *I*
30.0000 mL/h | Status: DC | PRN
Start: 2015-03-09 — End: 2015-03-09
  Administered 2015-03-09: 30 mL/h via INTRAVENOUS

## 2015-03-09 NOTE — Progress Notes (Signed)
Patient here for IV Decitabine infusion, tolerated well, no complaint. Positive blood return from PIV before and after infusion. AVS provided, next appointment set, discharged to home.

## 2015-03-10 ENCOUNTER — Ambulatory Visit: Payer: Self-pay

## 2015-03-10 VITALS — BP 125/59 | HR 88 | Temp 97.9°F | Resp 18 | Wt 266.5 lb

## 2015-03-10 DIAGNOSIS — D471 Chronic myeloproliferative disease: Secondary | ICD-10-CM

## 2015-03-10 MED ORDER — SODIUM CHLORIDE 0.9 % IV SOLN WRAPPED *I*
20.0000 mg/m2 | Freq: Once | INTRAVENOUS | Status: AC
Start: 2015-03-10 — End: 2015-03-10
  Administered 2015-03-10: 47 mg via INTRAVENOUS
  Filled 2015-03-10: qty 9.4

## 2015-03-10 MED ORDER — GRANISETRON HCL 1 MG PO TABS *I*
1.0000 mg | ORAL_TABLET | Freq: Once | ORAL | Status: AC
Start: 2015-03-10 — End: 2015-03-10
  Administered 2015-03-10: 1 mg via ORAL

## 2015-03-10 MED ORDER — SODIUM CHLORIDE 0.9 % IV SOLN WRAPPED *I*
30.0000 mL/h | Status: DC | PRN
Start: 2015-03-10 — End: 2015-03-11

## 2015-03-10 MED ORDER — GRANISETRON HCL 1 MG PO TABS *I*
ORAL_TABLET | ORAL | Status: DC
Start: 2015-03-10 — End: 2015-03-11
  Filled 2015-03-10: qty 1

## 2015-03-10 NOTE — Progress Notes (Signed)
Patient here for IV Decitabine infusion, tolerated well, no complaint. Positive blood return from PIV before and after infusion, PIV left in place for scheduled treatment tomorrow. AVS declined, next appointment set, discharged to home.

## 2015-03-11 ENCOUNTER — Ambulatory Visit: Payer: Self-pay | Admitting: Primary Care

## 2015-03-11 ENCOUNTER — Telehealth: Payer: Self-pay | Admitting: Hematology

## 2015-03-11 VITALS — BP 135/68 | HR 92 | Temp 97.2°F | Resp 18 | Wt 265.9 lb

## 2015-03-11 DIAGNOSIS — D471 Chronic myeloproliferative disease: Secondary | ICD-10-CM

## 2015-03-11 MED ORDER — SODIUM CHLORIDE 0.9 % IV SOLN WRAPPED *I*
20.0000 mg/m2 | Freq: Once | INTRAVENOUS | Status: AC
Start: 2015-03-11 — End: 2015-03-11
  Administered 2015-03-11: 47 mg via INTRAVENOUS
  Filled 2015-03-11: qty 9.4

## 2015-03-11 MED ORDER — SODIUM CHLORIDE 0.9 % IV SOLN WRAPPED *I*
30.0000 mL/h | Status: DC | PRN
Start: 2015-03-11 — End: 2015-03-11

## 2015-03-11 MED ORDER — GRANISETRON HCL 1 MG PO TABS *I*
1.0000 mg | ORAL_TABLET | Freq: Once | ORAL | Status: AC
Start: 2015-03-11 — End: 2015-03-11
  Administered 2015-03-11: 1 mg via ORAL

## 2015-03-11 MED ORDER — GRANISETRON HCL 1 MG PO TABS *I*
ORAL_TABLET | ORAL | Status: DC
Start: 2015-03-11 — End: 2015-03-11
  Filled 2015-03-11: qty 1

## 2015-03-11 NOTE — Progress Notes (Signed)
Patient arrived to clinic in stable condition to receive D3 Decitabine - reports fatigue but otherwise feels well. Labs within parameters. Patient tolerated treatment well without complication. PIV provided blood return before and after treatment - left in place for tomorrow's treatment. Encouraged to call with questions/concerns. Discharged in stable condition, ambulatory.

## 2015-03-11 NOTE — Telephone Encounter (Signed)
I checked with her nurse today and unfortunately they are able to take her earlier - I called Neeya to let you know that.

## 2015-03-12 ENCOUNTER — Ambulatory Visit: Payer: Self-pay

## 2015-03-12 VITALS — BP 147/71 | HR 101 | Temp 95.4°F | Resp 18 | Wt 264.4 lb

## 2015-03-12 DIAGNOSIS — D471 Chronic myeloproliferative disease: Secondary | ICD-10-CM

## 2015-03-12 MED ORDER — SODIUM CHLORIDE 0.9 % IV SOLN WRAPPED *I*
30.0000 mL/h | Status: AC | PRN
Start: 2015-03-12 — End: 2015-03-13
  Administered 2015-03-12: 30 mL/h via INTRAVENOUS

## 2015-03-12 MED ORDER — SODIUM CHLORIDE 0.9 % IV SOLN WRAPPED *I*
20.0000 mg/m2 | Freq: Once | INTRAVENOUS | Status: AC
Start: 2015-03-12 — End: 2015-03-12
  Administered 2015-03-12: 47 mg via INTRAVENOUS
  Filled 2015-03-12: qty 9.4

## 2015-03-12 MED ORDER — GRANISETRON HCL 1 MG PO TABS *I*
ORAL_TABLET | ORAL | Status: AC
Start: 2015-03-12 — End: 2015-03-12
  Administered 2015-03-12: 1 mg via ORAL
  Filled 2015-03-12: qty 1

## 2015-03-12 MED ORDER — GRANISETRON HCL 1 MG PO TABS *I*
1.0000 mg | ORAL_TABLET | Freq: Once | ORAL | Status: AC
Start: 2015-03-12 — End: 2015-03-12

## 2015-03-12 NOTE — Progress Notes (Unsigned)
Pt tolerated Decatibine  well.   AVS given at d/c. Next appointment set.   Blood return pre/post treatment per piv, left in place for tomorrow treatment.

## 2015-03-13 ENCOUNTER — Ambulatory Visit: Payer: Self-pay | Admitting: Oncology

## 2015-03-13 VITALS — BP 143/69 | HR 87 | Temp 96.3°F | Resp 18

## 2015-03-13 DIAGNOSIS — D471 Chronic myeloproliferative disease: Secondary | ICD-10-CM

## 2015-03-13 MED ORDER — SODIUM CHLORIDE 0.9 % IV SOLN WRAPPED *I*
30.0000 mL/h | Status: DC | PRN
Start: 2015-03-13 — End: 2015-03-13

## 2015-03-13 MED ORDER — GRANISETRON HCL 1 MG PO TABS *I*
1.0000 mg | ORAL_TABLET | Freq: Once | ORAL | Status: AC
Start: 2015-03-13 — End: 2015-03-13
  Administered 2015-03-13: 1 mg via ORAL

## 2015-03-13 MED ORDER — GRANISETRON HCL 1 MG PO TABS *I*
ORAL_TABLET | ORAL | Status: DC
Start: 2015-03-13 — End: 2015-03-13
  Filled 2015-03-13: qty 1

## 2015-03-13 MED ORDER — SODIUM CHLORIDE 0.9 % IV SOLN WRAPPED *I*
20.0000 mg/m2 | Freq: Once | INTRAVENOUS | Status: AC
Start: 2015-03-13 — End: 2015-03-13
  Administered 2015-03-13: 47 mg via INTRAVENOUS
  Filled 2015-03-13: qty 9.4

## 2015-03-13 NOTE — Progress Notes (Signed)
Pt tolerated Decitabine well through piv. Consent in chart, labs within tx parameters. PIV intact with pos. Blood return pre/post infusion. No new complaints. D/c home, next appts set.

## 2015-03-14 ENCOUNTER — Ambulatory Visit
Admission: RE | Admit: 2015-03-14 | Discharge: 2015-03-14 | Disposition: A | Discharge status: Home / Self Care | Payer: Self-pay | Source: Ambulatory Visit | Attending: Hematology | Admitting: Hematology

## 2015-03-14 ENCOUNTER — Telehealth: Payer: Self-pay

## 2015-03-14 ENCOUNTER — Telehealth: Payer: Self-pay | Admitting: Hematology

## 2015-03-14 DIAGNOSIS — D471 Chronic myeloproliferative disease: Secondary | ICD-10-CM

## 2015-03-14 DIAGNOSIS — D72829 Elevated white blood cell count, unspecified: Secondary | ICD-10-CM

## 2015-03-14 LAB — COMPREHENSIVE METABOLIC PANEL, PL
ALT, PL: 19 U/L (ref 0–35)
AST, PL: 44 U/L — ABNORMAL HIGH (ref 0–35)
Albumin, PL: 4.4 g/dL (ref 3.5–5.2)
Alk Phos, PL: 70 U/L (ref 35–105)
Anion Gap,PL: 16 (ref 7–16)
Bilirubin Total, PL: 0.6 mg/dL (ref 0.0–1.2)
CO2,Plasma: 25 mmol/L (ref 20–28)
Calcium, PL: 9.6 mg/dL (ref 8.6–10.2)
Chloride,Plasma: 99 mmol/L (ref 96–108)
Creatinine: 0.92 mg/dL (ref 0.51–0.95)
GFR,Black: 81 *
GFR,Caucasian: 70 *
Glucose,Plasma: 154 mg/dL — ABNORMAL HIGH (ref 60–99)
Potassium,Plasma: 4.5 mmol/L (ref 3.4–4.7)
Sodium,Plasma: 140 mmol/L (ref 133–145)
Total Protein, PL: 7.4 g/dL (ref 6.3–7.7)
UN,Plasma: 18 mg/dL (ref 6–20)

## 2015-03-14 LAB — CBC AND DIFFERENTIAL
Baso # K/uL: 0 10*3/uL (ref 0.0–0.1)
Basophil %: 0 %
Eos # K/uL: 0 10*3/uL (ref 0.0–0.4)
Eosinophil %: 0 %
Hematocrit: 24 % — ABNORMAL LOW (ref 34–45)
Hemoglobin: 7.9 g/dL — ABNORMAL LOW (ref 11.2–15.7)
Lymph # K/uL: 3.5 10*3/uL (ref 1.2–3.7)
Lymphocyte %: 3.7 %
MCH: 31 pg/cell (ref 26–32)
MCHC: 33 g/dL (ref 32–36)
MCV: 94 fL (ref 79–95)
Mono # K/uL: 0 10*3/uL — ABNORMAL LOW (ref 0.2–0.9)
Monocyte %: 0 %
Neut # K/uL: 53.9 10*3/uL — ABNORMAL HIGH (ref 1.6–6.1)
Nucl RBC # K/uL: 0.4 10*3/uL — ABNORMAL HIGH (ref 0.0–0.0)
Nucl RBC %: 0.4 /100 WBC — ABNORMAL HIGH (ref 0.0–0.2)
Platelets: 66 10*3/uL — ABNORMAL LOW (ref 160–370)
RBC: 2.6 MIL/uL — ABNORMAL LOW (ref 3.9–5.2)
RDW: 20.9 % — ABNORMAL HIGH (ref 11.7–14.4)
Seg Neut %: 47.8 %
WBC: 88.4 10*3/uL — ABNORMAL HIGH (ref 4.0–10.0)

## 2015-03-14 LAB — DIFF MANUAL
Bands %: 13 % (ref 0–10)
Blasts %: 1 % — ABNORMAL HIGH (ref 0–0)
Diff Based On: 134 CELLS
Metamyelocyte %: 16 % — ABNORMAL HIGH (ref 0–1)
Myelocyte %: 18 % — ABNORMAL HIGH (ref 0–0)
Promyelocyte %: 2 % — ABNORMAL HIGH (ref 0–0)

## 2015-03-14 LAB — LD, PL: LD, PL: 1465 U/L — ABNORMAL HIGH (ref 118–225)

## 2015-03-14 LAB — TYPE AND SCREEN
ABO RH Blood Type: A POS
Antibody Screen: NEGATIVE

## 2015-03-14 LAB — URIC ACID: Urate: 6.3 mg/dL (ref 2.7–6.8)

## 2015-03-14 LAB — NEUTROPHIL #-INSTRUMENT: Neutrophil #-Instrument: 49.4 10*3/uL

## 2015-03-14 NOTE — Telephone Encounter (Signed)
Left message for patient to get 1 unit of blood tomorrow at 1pm.

## 2015-03-14 NOTE — Telephone Encounter (Signed)
Critical lab value called in:     Name of provider:Liesveld/Likly     Time provider notified:[id number]     Time provider responded:       Critical lab and value: Bands 13%     Action taken as result of notification:  Provider paged and aware

## 2015-03-14 NOTE — Telephone Encounter (Signed)
Talked to Carly Higgins, let her know that her Hct is 24% so I will schedule her for 1 unit of blood for tomorrow per parameters.   She denies shortness of breath or chest pain. Good understanding.

## 2015-03-15 ENCOUNTER — Ambulatory Visit: Payer: Self-pay

## 2015-03-15 VITALS — BP 123/66 | HR 82 | Temp 99.3°F | Resp 16 | Wt 262.2 lb

## 2015-03-15 DIAGNOSIS — D471 Chronic myeloproliferative disease: Secondary | ICD-10-CM

## 2015-03-15 MED ORDER — ACETAMINOPHEN 325 MG PO TABS *I*
ORAL_TABLET | ORAL | Status: AC
Start: 2015-03-15 — End: 2015-03-15
  Administered 2015-03-15: 650 mg via ORAL
  Filled 2015-03-15: qty 2

## 2015-03-15 MED ORDER — ACETAMINOPHEN 325 MG PO TABS *I*
650.0000 mg | ORAL_TABLET | Freq: Once | ORAL | Status: AC
Start: 2015-03-15 — End: 2015-03-15

## 2015-03-15 MED ORDER — SODIUM CHLORIDE 0.9 % IV SOLN WRAPPED *I*
30.0000 mL/h | Status: DC | PRN
Start: 2015-03-15 — End: 2015-03-15

## 2015-03-15 NOTE — Patient Instructions (Signed)
March 2017   Sunday Monday Tuesday Wednesday Thursday Friday Saturday                  1     CHEMOTHERAPY    4:00 PM   (120 min.)   Simon, Beth G, RN   [facility] 2     CHEMOTHERAPY    4:00 PM   (120 min.)   Gonzalez, Dajilis E, RN   [facility] 3     CHEMOTHERAPY    4:00 PM   (120 min.)   Molodetz, Annie, RN   [facility] 4     CHEMOTHERAPY    9:00 AM   (120 min.)   Seger, Kathleen, RN   [facility]   5     CHEMOTHERAPY    9:00 AM   (120 min.)   Telfer, Kasey, RN   [facility] 6     Outpatient Visit    7:02 AM   Liesveld, Jane, MD   [facility] 7     INFUSION 1HR    1:00 PM   (180 min.)   Porter, Corinne, RN   [facility] 8     9     10     11       12     13     14     15     INFUSION 1HR    4:30 PM   (120 min.)   [facility], POD D   [facility] 16     NEW PATIENT VISIT    2:30 PM   (60 min.)   Ellis, Michael, LMSW    [facility]     FOLLOW UP VISIT    2:30 PM   (30 min.)   Liesveld, Jane, MD    [facility] 17     18       19     20     21     22     23     24     25       26     27     28     29     30     08 Jun 2015   Sunday Monday Tuesday Wednesday Thursday Friday Saturday        1     2     3     4     5     6       7     8     9     10     11     12     13       14     15     16     17     18     19     20       21     22     23     24     25     26     27       28   17     [place]!     90                           ]

## 2015-03-15 NOTE — Progress Notes (Signed)
Pt tolerated 1 unit PRBC's, no complaints.  Current blood consent in chart.  VS stable throughout transfusion, no symptoms of hypersensitivity reaction noted by pt or RN.  Good blood return obtained from PIV.  AVS declined, next appointments are scheduled.

## 2015-03-17 LAB — RED BLOOD CELLS
Coded Blood type: 6200
Component blood type: A POS
Dispense status: TRANSFUSED

## 2015-03-18 ENCOUNTER — Telehealth: Payer: Self-pay | Admitting: Hematology

## 2015-03-18 NOTE — Telephone Encounter (Signed)
Talked to Ayani, let her know that I am going to have to talk to [name] about paperwork.

## 2015-03-18 NOTE — Telephone Encounter (Signed)
Talked to Pessel, she has FMLA paperwork that needs to be filled out. She is going to bring up to [address].   It has to be filled out and faxed by 3/21 and she goes on vacation on 3/17.

## 2015-03-21 ENCOUNTER — Telehealth: Payer: Self-pay | Admitting: Hematology

## 2015-03-21 NOTE — Telephone Encounter (Signed)
Let patient know that her paperwork is ready to be picked up

## 2015-03-23 ENCOUNTER — Ambulatory Visit
Admission: RE | Admit: 2015-03-23 | Discharge: 2015-03-23 | Disposition: A | Discharge status: Home / Self Care | Payer: Self-pay | Source: Ambulatory Visit | Attending: Hematology | Admitting: Hematology

## 2015-03-23 ENCOUNTER — Ambulatory Visit: Payer: Self-pay

## 2015-03-23 ENCOUNTER — Telehealth: Payer: Self-pay | Admitting: Hematology

## 2015-03-23 VITALS — BP 137/63 | HR 86 | Temp 98.1°F | Resp 18 | Wt 262.8 lb

## 2015-03-23 DIAGNOSIS — D471 Chronic myeloproliferative disease: Secondary | ICD-10-CM

## 2015-03-23 DIAGNOSIS — D72829 Elevated white blood cell count, unspecified: Secondary | ICD-10-CM

## 2015-03-23 LAB — CBC AND DIFFERENTIAL
Baso # K/uL: 0 10*3/uL (ref 0.0–0.1)
Basophil %: 0 %
Eos # K/uL: 0 10*3/uL (ref 0.0–0.4)
Eosinophil %: 0 %
Hematocrit: 24 % — ABNORMAL LOW (ref 34–45)
Hemoglobin: 7.5 g/dL — ABNORMAL LOW (ref 11.2–15.7)
Lymph # K/uL: 1.4 10*3/uL (ref 1.2–3.7)
Lymphocyte %: 3.5 %
MCH: 30 pg/cell (ref 26–32)
MCHC: 32 g/dL (ref 32–36)
MCV: 94 fL (ref 79–95)
Mono # K/uL: 0 10*3/uL — ABNORMAL LOW (ref 0.2–0.9)
Monocyte %: 0 %
Neut # K/uL: 27.2 10*3/uL — ABNORMAL HIGH (ref 1.6–6.1)
Nucl RBC # K/uL: 0.2 10*3/uL — ABNORMAL HIGH (ref 0.0–0.0)
Nucl RBC %: 0.6 /100 WBC — ABNORMAL HIGH (ref 0.0–0.2)
Platelets: 22 10*3/uL — ABNORMAL LOW (ref 160–370)
RBC: 2.5 MIL/uL — ABNORMAL LOW (ref 3.9–5.2)
RDW: 19.2 % — ABNORMAL HIGH (ref 11.7–14.4)
Seg Neut %: 69.6 %
WBC: 34.4 10*3/uL — ABNORMAL HIGH (ref 4.0–10.0)

## 2015-03-23 LAB — DIFF MANUAL
Bands %: 9 % (ref 0–10)
Blasts %: 2 % — ABNORMAL HIGH (ref 0–0)
Diff Based On: 115 CELLS
Metamyelocyte %: 3 % — ABNORMAL HIGH (ref 0–1)
Myelocyte %: 14 % — ABNORMAL HIGH (ref 0–0)

## 2015-03-23 LAB — COMPREHENSIVE METABOLIC PANEL
ALT: 20 U/L (ref 0–35)
AST: 29 U/L (ref 0–35)
Albumin: 4.5 g/dL (ref 3.5–5.2)
Alk Phos: 63 U/L (ref 35–105)
Anion Gap: 17 — ABNORMAL HIGH (ref 7–16)
Bilirubin,Total: 0.5 mg/dL (ref 0.0–1.2)
CO2: 22 mmol/L (ref 20–28)
Calcium: 8.9 mg/dL (ref 8.6–10.2)
Chloride: 103 mmol/L (ref 96–108)
Creatinine: 0.88 mg/dL (ref 0.51–0.95)
GFR,Black: 86 *
GFR,Caucasian: 74 *
Glucose: 128 mg/dL — ABNORMAL HIGH (ref 60–99)
Lab: 23 mg/dL — ABNORMAL HIGH (ref 6–20)
Potassium: 4.5 mmol/L (ref 3.3–5.1)
Sodium: 142 mmol/L (ref 133–145)
Total Protein: 6.8 g/dL (ref 6.3–7.7)

## 2015-03-23 LAB — TYPE AND SCREEN
ABO RH Blood Type: A POS
Antibody Screen: NEGATIVE

## 2015-03-23 LAB — LACTATE DEHYDROGENASE: LD: 593 U/L — ABNORMAL HIGH (ref 118–225)

## 2015-03-23 LAB — NEUTROPHIL #-INSTRUMENT: Neutrophil #-Instrument: 23.2 10*3/uL

## 2015-03-23 MED ORDER — ACETAMINOPHEN 325 MG PO TABS *I*
650.0000 mg | ORAL_TABLET | Freq: Once | ORAL | Status: AC
Start: 2015-03-23 — End: 2015-03-23
  Administered 2015-03-23: 650 mg via ORAL

## 2015-03-23 MED ORDER — SODIUM CHLORIDE 0.9 % IV SOLN WRAPPED *I*
30.0000 mL/h | Status: DC | PRN
Start: 2015-03-23 — End: 2015-03-23

## 2015-03-23 MED ORDER — ACETAMINOPHEN 325 MG PO TABS *I*
ORAL_TABLET | ORAL | Status: DC
Start: 2015-03-23 — End: 2015-03-23
  Filled 2015-03-23: qty 2

## 2015-03-23 NOTE — Telephone Encounter (Signed)
I spoke with Santiago Glad.  She is aware of her WBC and HCT results and will be here today for her transfusion.

## 2015-03-23 NOTE — Progress Notes (Signed)
Pt arrived to infusion stable.  Labs reviewed; Hct 24.  Pt requiring 1 Unit PRBC.  Consent in chart.  Pt tolerated treatment through PIV which drew back blood pre and post infusion.  VS stable.  AVS refused.  Pt ambulated off floor independently.

## 2015-03-23 NOTE — Telephone Encounter (Signed)
I left a message asking for a call back.

## 2015-03-24 ENCOUNTER — Ambulatory Visit: Payer: Self-pay

## 2015-03-24 ENCOUNTER — Ambulatory Visit: Payer: Self-pay | Admitting: Hematology

## 2015-03-24 VITALS — BP 159/75 | HR 89 | Temp 96.1°F | Resp 16 | Ht 65.75 in | Wt 262.7 lb

## 2015-03-24 DIAGNOSIS — D471 Chronic myeloproliferative disease: Secondary | ICD-10-CM

## 2015-03-24 NOTE — Progress Notes (Signed)
Subjective:      Patient ID: Carly Higgins is a 55 y.o. 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 By [employer] testing, she has mutations of TET2, ASXL1, CCND2, GATA2, SRSF2.       Treatment History:  1.  Decitabine 20 mg/m2 IV daily x 5 days - Has now had 4 cycles.   2.  Intermittent/dose adjusted Hydroxyurea; currently at 500 mg/day    HPI  Karlin's back pain is much better, and she feels better since we last saw her. She has not had to take NSAIDs.  She will be traveling to [place] tomorrow to visit her parents. She had red cells yesterday, and she has no symptoms of anemia.   Energy level is good, but she is tired probably due to trip preparations. . Perspires a lot but no true night sweats.  Still smoking and not interested in cessation methods at present. Weight loss has plateaued. She wishes to defer transplant until her parents can be with her in May. No fevers, brusiing, or bleeding.     Active Ambulatory Problems     Diagnosis Date Noted    Leukocytosis 09/22/2013    MPN (myeloproliferative neoplasm) 01/19/2014     Resolved Ambulatory Problems     Diagnosis Date Noted    No Resolved Ambulatory Problems     No Additional Past Medical History        Current Outpatient Prescriptions   Medication Sig Dispense Refill    naproxen (NAPROSYN) 500 MG tablet       hydroxyurea (HYDREA) 500 MG capsule Take 1 capsule (500 mg total) by mouth daily 30 capsule 6    cyclobenzaprine (FLEXERIL) 10 MG tablet Take 10 mg by mouth 3 times daily as needed for Muscle spasms      ibuprofen (ADVIL,MOTRIN) 200 MG tablet Take 200 mg by mouth 3 times daily as needed for Pain      allopurinol (ZYLOPRIM) 300 MG tablet Take 1 tablet (300 mg total) by mouth daily 10 tablet 0    prochlorperazine (COMPAZINE) 10 MG tablet Take 1 tablet (10 mg total) by mouth 4 times daily as needed for Nausea 40 tablet 6    escitalopram (LEXAPRO) 10 MG tablet Take 15 mg by mouth daily        No current facility-administered medications for this visit.      Review of Systems   All other systems reviewed and are negative.    Objective:     Vitals:    03/24/15 1448   BP: 159/75   Pulse: 89   Resp: 16   Temp: 35.6 C (96.1 F)   Weight: 119.2 kg (262 lb 10.9 oz)   Height: 167 cm (5' 5.75")       Laboratory Data:        Lab results: 03/23/15  0721 03/14/15  0710 03/07/15  0713   WBC 34.4* 88.4* 66.7*   Hemoglobin 7.5* 7.9* 8.1*   Hematocrit 24* 24* 25*   RBC 2.5* 2.6* 2.7*   Platelets 22* 66* 69*     Lab Results   Component Value Date/Time    WBC 34.4 (H) 03/23/2015 07:21 AM    RBC 2.5 (L) 03/23/2015 07:21 AM    HGB 7.5 (L) 03/23/2015 07:21 AM    HCT 24 (L) 03/23/2015 07:21 AM    MCV 94 03/23/2015 07:21 AM    RDW 19.2 (H) 03/23/2015 07:21 AM    PLT 22 (L) 03/23/2015 07:21  AM    SEGR 69.6 03/23/2015 07:21 AM    LYMPR 3.5 03/23/2015 07:21 AM    BAND 9 03/23/2015 07:21 AM    MONOR 0.0 03/23/2015 07:21 AM    EOSR 0.0 03/23/2015 07:21 AM    BASOR 0.0 03/23/2015 07:21 AM    ASEGR 27.2 (H) 03/23/2015 07:21 AM    ALYMR 1.4 03/23/2015 07:21 AM    AMONR 0.0 (L) 03/23/2015 07:21 AM    AEOSR 0.0 03/23/2015 07:21 AM    ABASR 0.0 03/23/2015 07:21 AM     Physical Exam   Constitutional: Looks well; in good spirits;  HENT:   Mouth/Throat: Oropharynx is clear and moist. No oropharyngeal exudate.   Eyes: No scleral icterus.   Neck: Neck supple.   Cardiovascular: Normal rate and regular rhythm.    Pulmonary/Chest: Effort normal and breath sounds normal.   Abdominal: Soft. Bowel sounds are normal. No splenomegaly detectable   Neurological: She is alert and oriented to person, place, and time.   Skin: Skin is warm and dry. No rash noted. No bruises.   No current pain over spine or at sciatic notch.     Assessment:     Ms. Divelbiss is a 55 year old female with unclassified myeloproliferative disorder.  Her counts are quite well-controlled, and she can now stop the hydroxyurea.  Given that her platelets are 22K, I have told her  not to use NSAIDs and to use tylenol if she has any discomfort.      Today, we reviewed generalities of transplant; we would plan conditioning with Busulfan/fludarabine. We reviewed care during nadir, risks for GVHD, and other risks of transplant.    Plan:     1. Hold hydroxyurea  2. Will repeat counts once she is back from [facility], and I have given her a requisition to take to [facility] in case she has symptoms and needs to have blood work done there.   3. Will check PFTs, ECHO, dental exam in next couple weeks to prepare for transplant   4. She also met with Verlin Fester, BMT coordinator today .  We will see her back in mid-April and will continue to make plans for transplant.     Cheron Schaumann, MD

## 2015-03-24 NOTE — Progress Notes (Signed)
BMT PSYCHOSOCIAL EVALUATION    [name] presents to the BMT clinic to meet with [name] for consideration of treatment of myeloproliferative neoplasm.  She was only able to meet with social work for a brief period.  She will need to be rescheduled with social work for an evaluation prior to transplant.  Social work reviewed the patient's medical record in preparation for the visit today.    Carly Higgins, LMSW  Blood & Marrow Transplant Social Worker  [phone]

## 2015-03-25 LAB — RED BLOOD CELLS
Coded Blood type: 6200
Component blood type: A POS
Dispense status: TRANSFUSED

## 2015-04-04 ENCOUNTER — Telehealth: Payer: Self-pay | Admitting: Hematology

## 2015-04-04 NOTE — Telephone Encounter (Signed)
Talked to Carly Higgins, she is feeling tired and will be coming back Friday. She wants to set up a transfusion for Saturday.   I let her know to get blood work done before her tranfusion at the main lab in the [facility], they are open from 9am-1pm.   I will have the infusion schedule call to make appointment. Good understanding.

## 2015-04-08 ENCOUNTER — Telehealth: Payer: Self-pay | Admitting: Hematology

## 2015-04-08 NOTE — Telephone Encounter (Signed)
Talked to Carly Higgins, her fatigue level is a lot better.   She wants to cancel transfusion on Sunday and will get labs done on Monday.   Good understanding.

## 2015-04-10 ENCOUNTER — Ambulatory Visit: Payer: Self-pay

## 2015-04-11 ENCOUNTER — Inpatient Hospital Stay: Payer: Self-pay

## 2015-04-11 ENCOUNTER — Ambulatory Visit
Admission: RE | Admit: 2015-04-11 | Discharge: 2015-04-11 | Disposition: A | Discharge status: Home / Self Care | Payer: Self-pay | Source: Ambulatory Visit | Attending: Hematology | Admitting: Hematology

## 2015-04-11 ENCOUNTER — Telehealth: Payer: Self-pay | Admitting: Hematology

## 2015-04-11 ENCOUNTER — Telehealth: Payer: Self-pay

## 2015-04-11 DIAGNOSIS — D72829 Elevated white blood cell count, unspecified: Secondary | ICD-10-CM

## 2015-04-11 DIAGNOSIS — D471 Chronic myeloproliferative disease: Secondary | ICD-10-CM

## 2015-04-11 LAB — COMPREHENSIVE METABOLIC PANEL, PL
ALT, PL: 20 U/L (ref 0–35)
AST, PL: 50 U/L — ABNORMAL HIGH (ref 0–35)
Albumin, PL: 4.3 g/dL (ref 3.5–5.2)
Alk Phos, PL: 71 U/L (ref 35–105)
Anion Gap,PL: 14 (ref 7–16)
Bilirubin Total, PL: 0.5 mg/dL (ref 0.0–1.2)
CO2,Plasma: 26 mmol/L (ref 20–28)
Calcium, PL: 9.6 mg/dL (ref 8.6–10.2)
Chloride,Plasma: 103 mmol/L (ref 96–108)
Creatinine: 0.8 mg/dL (ref 0.51–0.95)
GFR,Black: 96 *
GFR,Caucasian: 83 *
Glucose,Plasma: 159 mg/dL — ABNORMAL HIGH (ref 60–99)
Potassium,Plasma: 4.8 mmol/L — ABNORMAL HIGH (ref 3.4–4.7)
Sodium,Plasma: 143 mmol/L (ref 133–145)
Total Protein, PL: 7.3 g/dL (ref 6.3–7.7)
UN,Plasma: 19 mg/dL (ref 6–20)

## 2015-04-11 LAB — CBC AND DIFFERENTIAL
Baso # K/uL: 0 10*3/uL (ref 0.0–0.1)
Basophil %: 0 %
Eos # K/uL: 0.4 10*3/uL (ref 0.0–0.4)
Eosinophil %: 0.3 %
Hematocrit: 24 % — ABNORMAL LOW (ref 34–45)
Hemoglobin: 7.9 g/dL — ABNORMAL LOW (ref 11.2–15.7)
Lymph # K/uL: 7.1 10*3/uL — ABNORMAL HIGH (ref 1.2–3.7)
Lymphocyte %: 6 %
MCH: 30 pg/cell (ref 26–32)
MCHC: 33 g/dL (ref 32–36)
MCV: 92 fL (ref 79–95)
Mono # K/uL: 0 10*3/uL — ABNORMAL LOW (ref 0.2–0.9)
Monocyte %: 0 %
Neut # K/uL: 48.6 10*3/uL — ABNORMAL HIGH (ref 1.6–6.1)
Nucl RBC # K/uL: 1.4 10*3/uL — ABNORMAL HIGH (ref 0.0–0.0)
Nucl RBC %: 1.1 /100 WBC — ABNORMAL HIGH (ref 0.0–0.2)
Platelets: 63 10*3/uL — ABNORMAL LOW (ref 160–370)
RBC: 2.6 MIL/uL — ABNORMAL LOW (ref 3.9–5.2)
RDW: 22 % — ABNORMAL HIGH (ref 11.7–14.4)
Seg Neut %: 29.2 %
WBC: 118.6 10*3/uL — ABNORMAL HIGH (ref 4.0–10.0)

## 2015-04-11 LAB — DIFF MANUAL
Bands %: 12 % (ref 0–10)
Blasts %: 2 % — ABNORMAL HIGH (ref 0–0)
Diff Based On: 400 CELLS
Metamyelocyte %: 10 % — ABNORMAL HIGH (ref 0–1)
Myelocyte %: 37 % — ABNORMAL HIGH (ref 0–0)
Promyelocyte %: 2 % — ABNORMAL HIGH (ref 0–0)

## 2015-04-11 LAB — TYPE AND SCREEN
ABO RH Blood Type: A POS
Antibody Screen: NEGATIVE

## 2015-04-11 LAB — NEUTROPHIL #-INSTRUMENT: Neutrophil #-Instrument: 61.2 10*3/uL

## 2015-04-11 LAB — LD, PL: LD, PL: 1728 U/L — ABNORMAL HIGH (ref 118–225)

## 2015-04-11 NOTE — Telephone Encounter (Signed)
Critical lab value called in:     Name of provider:Liesveld       Time provider notified:0917     Time provider responded:  [phone]     Critical lab and value:Bands 12%     Action taken as result of notification:Provider paged and aware

## 2015-04-11 NOTE — Telephone Encounter (Signed)
Talked to Carly Higgins, let her know that her wbc increased to 118, per [name] start taking hydroxyurea 1000 mg BID.   I also let her know that her Hct is 24, she states she has a bit of shortness of breath but otherwise feels good.   I let her know that [name] appointment is at 2:15pm tomorrow before her Decitabine infusion at 3pm.   She had good understanding.

## 2015-04-12 ENCOUNTER — Ambulatory Visit: Payer: Self-pay

## 2015-04-12 ENCOUNTER — Ambulatory Visit: Payer: Self-pay | Admitting: Oncology

## 2015-04-12 VITALS — BP 96/47 | HR 80 | Temp 98.6°F | Resp 16

## 2015-04-12 DIAGNOSIS — D471 Chronic myeloproliferative disease: Secondary | ICD-10-CM

## 2015-04-12 MED ORDER — SODIUM CHLORIDE 0.9 % IV SOLN WRAPPED *I*
20.0000 mg/m2 | Freq: Once | INTRAVENOUS | Status: AC
Start: 2015-04-12 — End: 2015-04-12
  Administered 2015-04-12: 47 mg via INTRAVENOUS
  Filled 2015-04-12: qty 9.4

## 2015-04-12 MED ORDER — GRANISETRON HCL 1 MG PO TABS *I*
1.0000 mg | ORAL_TABLET | Freq: Once | ORAL | Status: AC
Start: 2015-04-12 — End: 2015-04-12
  Administered 2015-04-12: 1 mg via ORAL

## 2015-04-12 MED ORDER — ACETAMINOPHEN 325 MG PO TABS *I*
ORAL_TABLET | ORAL | Status: AC
Start: 2015-04-12 — End: 2015-04-13
  Filled 2015-04-12: qty 2

## 2015-04-12 MED ORDER — SODIUM CHLORIDE 0.9 % IV SOLN WRAPPED *I*
30.0000 mL/h | Status: DC | PRN
Start: 2015-04-12 — End: 2015-04-13
  Administered 2015-04-12: 30 mL/h via INTRAVENOUS

## 2015-04-12 MED ORDER — ACETAMINOPHEN 325 MG PO TABS *I*
650.0000 mg | ORAL_TABLET | Freq: Once | ORAL | Status: AC
Start: 2015-04-12 — End: 2015-04-12
  Administered 2015-04-12: 650 mg via ORAL

## 2015-04-12 MED ORDER — GRANISETRON HCL 1 MG PO TABS *I*
ORAL_TABLET | ORAL | Status: AC
Start: 2015-04-12 — End: 2015-04-13
  Filled 2015-04-12: qty 1

## 2015-04-12 NOTE — Progress Notes (Signed)
[facility] SAME DAY TREATMENT HAND-OFF:   SITUATION:   Scheduled treatment category for today:     Cancer treatment:  Chemotherapy and Transfusion    Is this a new cancer treatment?: No      Consent obtained:  Yes    Location:  [facility]    Labs complete:  Yes    Within parameters: Yes      Current patient status:  Scheduled treatment    OK to treat for scheduled treatment:  Yes

## 2015-04-12 NOTE — Patient Instructions (Signed)
Carly Higgins  November 09, 1960  Dx: Myeloproliferative Disorder  Treatment: Decitabine 5 days out of 28 days  Labs: every 2 weeks  Antiemetic: [place], RN/[name] [phone]  Updated: 04/12/15        April 2017   Sunday Monday Tuesday Wednesday Thursday Friday Saturday                                 1       2   3     4  FOLLOW UP with Dr. Liesveld@ 2:15    Decitabine @ 3pm   5  Cycle 5: Day 1  Decitabine@ 3:30     6  Decitabine@ 430   7  Decitabine@ 330 8  Decitabine@ 1     9     10     11     12     13  BMT Clinic with Dr. Liesveld @ 2   14     15       16     17     18     19     20     21     22       23     24     25     26     27     28     29       30                                                 Decitabine (Dacogen) - given intervenously over about 1-2 hour, usually for myelodysplastic syndromes (MDS)    Possible side effects:   fatigue, headache, nausea, constipation, diarrhea, cough, high blood sugar, fever, or mouth sores.    This medicine may cause bone marrow suppression (low blood counts):   A decrease in white blood cells    increases risk for infections   signs of infection: fever, chills, cough, sore throat, pain or difficulty passing urine    A decrease in red blood cells   causes weakness, feeling tired (fatigued), fainting spells, dizziness or lightheadedness, shortness of breath, or heart palpitations   A decrease in platelets    may cause bleeding, bruising, pinpoint red spots on the skin, nose bleeds, vaginal bleeding, bloody or black, tarry stools, or blood in the urine

## 2015-04-12 NOTE — Progress Notes (Signed)
Current consent in chart, labs meet treatment parameters.  Patient received day 1 cycle 5,  Decitabine without any adverse reactions.  Positive blood return noted from patent PIV.  Per patient request PIV in place for tomorrow's treatment.   Reinforced chemotherapy teaching.  Encouraged PO fluids. Current Blood consent in chart.   Patient hematocrit 24.  Patient also  received 1 unit of PRBC per order.  Patient tolerated blood without any adverse reactions.  For complete details see nursing assessment.  Instructed patient to contact clinic for any concerns.   For complete details see nursing assessment.  Next appointment made. AVS declined, discharged home in the care of self.

## 2015-04-13 ENCOUNTER — Ambulatory Visit: Payer: Self-pay

## 2015-04-13 VITALS — BP 113/59 | HR 102 | Temp 96.8°F | Resp 18 | Wt 268.0 lb

## 2015-04-13 DIAGNOSIS — D72829 Elevated white blood cell count, unspecified: Secondary | ICD-10-CM

## 2015-04-13 DIAGNOSIS — D471 Chronic myeloproliferative disease: Secondary | ICD-10-CM

## 2015-04-13 MED ORDER — SODIUM CHLORIDE 0.9 % IV SOLN WRAPPED *I*
20.0000 mg/m2 | Freq: Once | INTRAVENOUS | Status: AC
Start: 2015-04-13 — End: 2015-04-13
  Administered 2015-04-13: 47 mg via INTRAVENOUS
  Filled 2015-04-13: qty 9.4

## 2015-04-13 MED ORDER — GRANISETRON HCL 1 MG PO TABS *I*
ORAL_TABLET | ORAL | Status: AC
Start: 2015-04-13 — End: 2015-04-14
  Filled 2015-04-13: qty 1

## 2015-04-13 MED ORDER — GRANISETRON HCL 1 MG PO TABS *I*
1.0000 mg | ORAL_TABLET | Freq: Once | ORAL | Status: AC
Start: 2015-04-13 — End: 2015-04-13
  Administered 2015-04-13: 1 mg via ORAL

## 2015-04-13 MED ORDER — SODIUM CHLORIDE 0.9 % IV SOLN WRAPPED *I*
30.0000 mL/h | Status: DC | PRN
Start: 2015-04-13 — End: 2015-04-13

## 2015-04-13 NOTE — Progress Notes (Signed)
Subjective:      Patient ID: KEYUNA CUTHRELL is a 55 y.o. 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 By [employer] testing, she has mutations of TET2, ASXL1, CCND2, GATA2, SRSF2.       Treatment History:  1.  Decitabine 20 mg/m2 IV daily x 5 days - Cycle # 1 12/15/14  2.  Intermittent/dose adjusted Hydroxyurea    HPI  Emryn comes to the office today for her scheduled follow up.  She just returned from a 2 week trip to [place].  Effa reports feeling lousy today.  She has nausea and relates this to the increased dose of Hydroxyurea she is taking.  WBC has risen above 100,000 again. She was encouraged to take her compazine.  She also feels quite fatigued today.  She will be transfused in the infusion center a little later today.  No fevers.  No issues with bleeding.    Review of Systems   All other systems reviewed and are negative.    Objective:     Vitals:    04/12/15 1418   BP: 144/71   Pulse: (!) 117   Resp: 16   Temp: 35.4 C (95.7 F)   Weight: 120.2 kg (265 lb 1.7 oz)   Height: 167 cm (5' 5.75")     Laboratory Data:  Results for JANYCE, ELLINGER (MRN [id number]) as of 04/13/2015 14:41   Ref. Range 03/23/2015 07:21 04/11/2015 07:12   WBC Latest Ref Range: 4.0 - 10.0 THOU/uL 34.4 (H) 118.6 (H)   RBC Latest Ref Range: 3.9 - 5.2 MIL/uL 2.5 (L) 2.6 (L)   Hemoglobin Latest Ref Range: 11.2 - 15.7 g/dL 7.5 (L) 7.9 (L)   Hematocrit Latest Ref Range: 34 - 45 % 24 (L) 24 (L)   MCV Latest Ref Range: 79 - 95 fL 94 92   MCH Latest Ref Range: 26 - 32 pg/cell 30 30   MCHC Latest Ref Range: 32 - 36 g/dL 32 33   RDW Latest Ref Range: 11.7 - 14.4 % 19.2 (H) 22.0 (H)   Platelets Latest Ref Range: 160 - 370 THOU/uL 22 (L) 63 (L)   Giant PLTs Unknown Present    Manual DIFF Unknown RESULTS RESULTS   Neut # K/uL Latest Ref Range: 1.6 - 6.1 THOU/uL 27.2 (H) 48.6 (H)   Neutrophil #-Instrument Latest Units: THOU/uL 23.2 61.2   Lymph # K/uL Latest Ref Range: 1.2 - 3.7 THOU/uL 1.4 7.1 (H)    Mono # K/uL Latest Ref Range: 0.2 - 0.9 THOU/uL 0.0 (L) 0.0 (L)   Eos # K/uL Latest Ref Range: 0.0 - 0.4 THOU/uL 0.0 0.4   Baso # K/uL Latest Ref Range: 0.0 - 0.1 THOU/uL 0.0 0.0   Nucl RBC # K/uL Latest Ref Range: 0.0 - 0.0 THOU/uL 0.2 (H) 1.4 (H)   Seg Neut % Latest Units: % 69.6 29.2   Lymphocyte % Latest Units: % 3.5 6.0   Monocyte % Latest Units: % 0.0 0.0   Eosinophil % Latest Units: % 0.0 0.3   Basophil % Latest Units: % 0.0 0.0   Nucl RBC % Latest Ref Range: 0.0 - 0.2 /100 WBC 0.6 (H) 1.1 (H)   Bands % Latest Ref Range: 0 - 10 % 9 12 (HH)   Metamyelocyte % Latest Ref Range: 0 - 1 % 3 (H) 10 (H)   Myelocyte % Latest Ref Range: 0 - 0 % 14 (H) 37 (H)   Promyelocyte % Latest Ref Range:  0 - 0 %  2 (H)   Blasts % Latest Ref Range: 0 - 0 % 2 (H) 2 (H)   Diff Based On Latest Units: CELLS 115 400     Results for JALEI, SHIBLEY (MRN [id number]) as of 04/13/2015 14:41   Ref. Range 04/11/2015 07:12   Sodium,Plasma Latest Ref Range: 133 - 145 mmol/L 143   Potassium,Plasma Latest Ref Range: 3.4 - 4.7 mmol/L 4.8 (H)   Chloride,Plasma Latest Ref Range: 96 - 108 mmol/L 103   CO2,Plasma Latest Ref Range: 20 - 28 mmol/L 26   Anion Gap,PL Latest Ref Range: 7 - 16  14   UN,Plasma Latest Ref Range: 6 - 20 mg/dL 19   Creatinine Latest Ref Range: 0.51 - 0.95 mg/dL 0.80   GFR,Black Latest Units: * 96   GFR,Caucasian Latest Units: * 83   Glucose,Plasma Latest Ref Range: 60 - 99 mg/dL 159 (H)   Calcium, PL Latest Ref Range: 8.6 - 10.2 mg/dL 9.6   Total Protein, PL Latest Ref Range: 6.3 - 7.7 g/dL 7.3   Albumin, PL Latest Ref Range: 3.5 - 5.2 g/dL 4.3   LD, PL Latest Ref Range: 118 - 225 U/L 1728 (H)   ALT, PL Latest Ref Range: 0 - 35 U/L 20   AST, PL Latest Ref Range: 0 - 35 U/L 50 (H)   Alk Phos, PL Latest Ref Range: 35 - 105 U/L 71   Bilirubin Total, PL Latest Ref Range: 0.0 - 1.2 mg/dL 0.5     Physical Exam   Constitutional: She is oriented to person, place, and time.   Angelena is sitting hunched over holding her head in her hand.  Feels  nauseated.   HENT:   Mouth/Throat: Oropharynx is clear and moist. No oropharyngeal exudate.   Eyes: No scleral icterus.   Neck: Neck supple.   Cardiovascular: Normal rate and regular rhythm.    Pulmonary/Chest: Effort normal and breath sounds normal.   Abdominal: Soft. Bowel sounds are normal.   No palpable organomegaly.   Neurological: She is alert and oriented to person, place, and time.   Skin: Skin is warm and dry. No rash noted.     Assessment:   DAMIEN CISAR is a 55 y.o. 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 By [employer] testing, she has mutations of TET2, ASXL1, CCND2, GATA2, SRSF2.   WBC has risen markedly off Hydroxyurea.  She is not taking 1,000 mg BID.    Plan:     1.  Continue Hydroxyurea 1,000 mg BID.  Will continue to check labs twice weekly and adjust dose accordingly.  2.  Will get transfused with red cells today for Hct of 24%.  3.  Begin cycle # 5 Decitabine today 04/12/15.  Compazine prn for nausea.  4.  BMT appointment on 04/21/15 to discuss further treatment and timing of bone marrow transplant.  Will schedule another course of Decitabine if required before moving to transplant.      Donnamarie Poag RN,MS  Nurse Practitioner

## 2015-04-13 NOTE — Progress Notes (Signed)
Pt arrived to infusion stable.  Labs reviewed and appropriate for treatment.  Pt tolerated Decitabine through PIV that was positive for blood return pre and post treatment.  VS stable.  AVS refused.  Pt aware of future appointments set.  Pt ambulated off floor independently.

## 2015-04-14 ENCOUNTER — Telehealth: Payer: Self-pay | Admitting: Hematology and Oncology

## 2015-04-14 ENCOUNTER — Ambulatory Visit: Payer: Self-pay

## 2015-04-14 VITALS — BP 137/75 | HR 101 | Temp 96.3°F | Resp 18 | Wt 262.5 lb

## 2015-04-14 DIAGNOSIS — D471 Chronic myeloproliferative disease: Secondary | ICD-10-CM

## 2015-04-14 LAB — NEUTROPHIL #-INSTRUMENT: Neutrophil #-Instrument: 70.5 10*3/uL

## 2015-04-14 LAB — CBC AND DIFFERENTIAL
Baso # K/uL: 0 10*3/uL (ref 0.0–0.1)
Basophil %: 0 %
Eos # K/uL: 0 10*3/uL (ref 0.0–0.4)
Eosinophil %: 0 %
Hematocrit: 25 % — ABNORMAL LOW (ref 34–45)
Hemoglobin: 8.3 g/dL — ABNORMAL LOW (ref 11.2–15.7)
Lymph # K/uL: 15.6 10*3/uL — ABNORMAL HIGH (ref 1.2–3.7)
Lymphocyte %: 11.5 %
MCH: 30 pg/cell (ref 26–32)
MCHC: 33 g/dL (ref 32–36)
MCV: 93 fL (ref 79–95)
Mono # K/uL: 0.7 10*3/uL (ref 0.2–0.9)
Monocyte %: 0.5 %
Neut # K/uL: 63.6 10*3/uL — ABNORMAL HIGH (ref 1.6–6.1)
Nucl RBC # K/uL: 0.5 10*3/uL — ABNORMAL HIGH (ref 0.0–0.0)
Nucl RBC %: 0.4 /100 WBC — ABNORMAL HIGH (ref 0.0–0.2)
Platelets: 45 10*3/uL — ABNORMAL LOW (ref 160–370)
RBC: 2.7 MIL/uL — ABNORMAL LOW (ref 3.9–5.2)
RDW: 21.1 % — ABNORMAL HIGH (ref 11.7–14.4)
Seg Neut %: 38 %
WBC: 129.8 10*3/uL — ABNORMAL HIGH (ref 4.0–10.0)

## 2015-04-14 LAB — COMPREHENSIVE METABOLIC PANEL, PL
ALT, PL: 21 U/L (ref 0–35)
AST, PL: 53 U/L — ABNORMAL HIGH (ref 0–35)
Albumin, PL: 4.4 g/dL (ref 3.5–5.2)
Alk Phos, PL: 72 U/L (ref 35–105)
Anion Gap,PL: 14 (ref 7–16)
Bilirubin Total, PL: 0.5 mg/dL (ref 0.0–1.2)
CO2,Plasma: 27 mmol/L (ref 20–28)
Calcium, PL: 9.4 mg/dL (ref 8.6–10.2)
Chloride,Plasma: 99 mmol/L (ref 96–108)
Creatinine: 0.77 mg/dL (ref 0.51–0.95)
GFR,Black: 101 *
GFR,Caucasian: 87 *
Glucose,Plasma: 124 mg/dL — ABNORMAL HIGH (ref 60–99)
Potassium,Plasma: 4.1 mmol/L (ref 3.4–4.7)
Sodium,Plasma: 140 mmol/L (ref 133–145)
Total Protein, PL: 7.5 g/dL (ref 6.3–7.7)
UN,Plasma: 15 mg/dL (ref 6–20)

## 2015-04-14 LAB — RED BLOOD CELLS
Coded Blood type: 6200
Component blood type: A POS
Dispense status: TRANSFUSED

## 2015-04-14 LAB — DIFF MANUAL
Bands %: 11 % (ref 0–10)
Blasts %: 2 % — ABNORMAL HIGH (ref 0–0)
Diff Based On: 200 CELLS
Metamyelocyte %: 10 % — ABNORMAL HIGH (ref 0–1)
Myelocyte %: 27 % — ABNORMAL HIGH (ref 0–0)
Promyelocyte %: 1 % — ABNORMAL HIGH (ref 0–0)

## 2015-04-14 LAB — LD, PL: LD, PL: 1771 U/L — ABNORMAL HIGH (ref 118–225)

## 2015-04-14 LAB — TYPE AND SCREEN
ABO RH Blood Type: A POS
Antibody Screen: NEGATIVE

## 2015-04-14 MED ORDER — GRANISETRON HCL 1 MG PO TABS *I*
ORAL_TABLET | ORAL | Status: AC
Start: 2015-04-14 — End: 2015-04-15
  Filled 2015-04-14: qty 1

## 2015-04-14 MED ORDER — SODIUM CHLORIDE 0.9 % IV SOLN WRAPPED *I*
30.0000 mL/h | Status: DC | PRN
Start: 2015-04-14 — End: 2015-04-15

## 2015-04-14 MED ORDER — SODIUM CHLORIDE 0.9 % IV SOLN WRAPPED *I*
20.0000 mg/m2 | Freq: Once | INTRAVENOUS | Status: AC
Start: 2015-04-14 — End: 2015-04-14
  Administered 2015-04-14: 47 mg via INTRAVENOUS
  Filled 2015-04-14: qty 9.4

## 2015-04-14 MED ORDER — GRANISETRON HCL 1 MG PO TABS *I*
1.0000 mg | ORAL_TABLET | Freq: Once | ORAL | Status: AC
Start: 2015-04-14 — End: 2015-04-14
  Administered 2015-04-14: 1 mg via ORAL

## 2015-04-14 NOTE — Telephone Encounter (Signed)
Lab called to report bands of 11K.  In line with past values.    Will forward to the team.    Steward Ros, MD  PGY 5, Hematology/Oncology Fellow

## 2015-04-14 NOTE — Progress Notes (Signed)
Pt completed decitabine infusion, piv removed, pt discharged to home

## 2015-04-14 NOTE — Progress Notes (Signed)
Pt seen today for iv infusion of decitabine. PIV in place. Consent in chart. Labs met parameters. Patient tolerated treatment well. Left patient in care of late nurse Marrisa Parker.

## 2015-04-14 NOTE — Addendum Note (Signed)
Addended by: Shirline Frees on: 04/14/2015 04:18 PM     Modules accepted: Orders

## 2015-04-15 ENCOUNTER — Ambulatory Visit: Payer: Self-pay | Admitting: Primary Care

## 2015-04-15 ENCOUNTER — Telehealth: Payer: Self-pay | Admitting: Hematology

## 2015-04-15 VITALS — BP 147/69 | HR 98 | Temp 97.2°F | Resp 20

## 2015-04-15 DIAGNOSIS — D471 Chronic myeloproliferative disease: Secondary | ICD-10-CM

## 2015-04-15 MED ORDER — SODIUM CHLORIDE 0.9 % IV SOLN WRAPPED *I*
20.0000 mg/m2 | Freq: Once | INTRAVENOUS | Status: AC
Start: 2015-04-15 — End: 2015-04-15
  Administered 2015-04-15: 47 mg via INTRAVENOUS
  Filled 2015-04-15: qty 9.4

## 2015-04-15 MED ORDER — SODIUM CHLORIDE 0.9 % IV SOLN WRAPPED *I*
30.0000 mL/h | Status: DC | PRN
Start: 2015-04-15 — End: 2015-04-15

## 2015-04-15 MED ORDER — GRANISETRON HCL 1 MG PO TABS *I*
1.0000 mg | ORAL_TABLET | Freq: Once | ORAL | Status: AC
Start: 2015-04-15 — End: 2015-04-15
  Administered 2015-04-15: 1 mg via ORAL

## 2015-04-15 MED ORDER — GRANISETRON HCL 1 MG PO TABS *I*
ORAL_TABLET | ORAL | Status: AC
Start: 2015-04-15 — End: 2015-04-16
  Filled 2015-04-15: qty 1

## 2015-04-15 NOTE — Telephone Encounter (Signed)
Talked to Kenya. Let her know her wbc is 129.8, per Jasmine Awe, NP increase hydroxyurea to 3 tablets in the morning and 2 tablets at night.   She will get labs done again on Monday. Good understanding.

## 2015-04-15 NOTE — Progress Notes (Signed)
Patient arrived to clinic in stable condition to receive IV Decitabine - no new complaints to report. Labs within parameters. Difficult PIV placement - application of heat and vein finder needed. Encouraged patient to drink as much as possible before tomorrow's treatment. Patient tolerated treatment well without complication. PIV provided blood return before and after treatment. Encouraged to call with questions/concerns. Discharged in stable condition, ambulatory.

## 2015-04-16 ENCOUNTER — Ambulatory Visit: Payer: Self-pay

## 2015-04-16 VITALS — BP 142/78 | HR 117 | Temp 95.7°F | Resp 18

## 2015-04-16 DIAGNOSIS — D471 Chronic myeloproliferative disease: Secondary | ICD-10-CM

## 2015-04-16 MED ORDER — GRANISETRON HCL 1 MG PO TABS *I*
ORAL_TABLET | ORAL | Status: AC
Start: 2015-04-16 — End: 2015-04-17
  Filled 2015-04-16: qty 1

## 2015-04-16 MED ORDER — SODIUM CHLORIDE 0.9 % IV SOLN WRAPPED *I*
30.0000 mL/h | Status: DC | PRN
Start: 2015-04-16 — End: 2015-04-16

## 2015-04-16 MED ORDER — SODIUM CHLORIDE 0.9 % IV SOLN WRAPPED *I*
20.0000 mg/m2 | Freq: Once | INTRAVENOUS | Status: DC
Start: 2015-04-16 — End: 2015-04-16
  Filled 2015-04-16: qty 9.4

## 2015-04-16 MED ORDER — GRANISETRON HCL 1 MG PO TABS *I*
1.0000 mg | ORAL_TABLET | Freq: Once | ORAL | Status: DC
Start: 2015-04-16 — End: 2015-04-16

## 2015-04-16 NOTE — Progress Notes (Signed)
Pt arrived to infusion stable.  Two attempts made to obtain IV access without success.  Carly Higgins refused further attempts for today.  Pt to see team 4/13.  AVS refused.  Pt ambulated off floor independently.

## 2015-04-18 ENCOUNTER — Ambulatory Visit: Payer: Self-pay

## 2015-04-18 ENCOUNTER — Telehealth: Payer: Self-pay

## 2015-04-18 ENCOUNTER — Ambulatory Visit
Admission: RE | Admit: 2015-04-18 | Discharge: 2015-04-18 | Disposition: A | Discharge status: Home / Self Care | Payer: Self-pay | Source: Ambulatory Visit | Attending: Hematology | Admitting: Hematology

## 2015-04-18 VITALS — BP 135/65 | HR 89 | Temp 97.9°F | Resp 18

## 2015-04-18 DIAGNOSIS — D72829 Elevated white blood cell count, unspecified: Secondary | ICD-10-CM

## 2015-04-18 DIAGNOSIS — D471 Chronic myeloproliferative disease: Secondary | ICD-10-CM

## 2015-04-18 LAB — COMPREHENSIVE METABOLIC PANEL, PL
ALT, PL: 22 U/L (ref 0–35)
AST, PL: 44 U/L — ABNORMAL HIGH (ref 0–35)
Albumin, PL: 4.4 g/dL (ref 3.5–5.2)
Alk Phos, PL: 71 U/L (ref 35–105)
Anion Gap,PL: 16 (ref 7–16)
Bilirubin Total, PL: 0.5 mg/dL (ref 0.0–1.2)
CO2,Plasma: 22 mmol/L (ref 20–28)
Calcium, PL: 9.2 mg/dL (ref 8.6–10.2)
Chloride,Plasma: 101 mmol/L (ref 96–108)
Creatinine: 0.64 mg/dL (ref 0.51–0.95)
GFR,Black: 116 *
GFR,Caucasian: 101 *
Glucose,Plasma: 137 mg/dL — ABNORMAL HIGH (ref 60–99)
Potassium,Plasma: 4.6 mmol/L (ref 3.4–4.7)
Sodium,Plasma: 139 mmol/L (ref 133–145)
Total Protein, PL: 7.7 g/dL (ref 6.3–7.7)
UN,Plasma: 21 mg/dL — ABNORMAL HIGH (ref 6–20)

## 2015-04-18 LAB — CBC AND DIFFERENTIAL
Baso # K/uL: 0 10*3/uL (ref 0.0–0.1)
Basophil %: 0 %
Eos # K/uL: 0 10*3/uL (ref 0.0–0.4)
Eosinophil %: 0 %
Hematocrit: 24 % — ABNORMAL LOW (ref 34–45)
Hemoglobin: 7.8 g/dL — ABNORMAL LOW (ref 11.2–15.7)
Lymph # K/uL: 5.4 10*3/uL — ABNORMAL HIGH (ref 1.2–3.7)
Lymphocyte %: 6 %
MCH: 30 pg/cell (ref 26–32)
MCHC: 33 g/dL (ref 32–36)
MCV: 91 fL (ref 79–95)
Mono # K/uL: 0 10*3/uL — ABNORMAL LOW (ref 0.2–0.9)
Monocyte %: 0 %
Neut # K/uL: 70.7 10*3/uL — ABNORMAL HIGH (ref 1.6–6.1)
Nucl RBC # K/uL: 0 10*3/uL (ref 0.0–0.0)
Nucl RBC %: 0 /100 WBC (ref 0.0–0.2)
Platelets: 32 10*3/uL — ABNORMAL LOW (ref 160–370)
RBC: 2.6 MIL/uL — ABNORMAL LOW (ref 3.9–5.2)
RDW: 20.4 % — ABNORMAL HIGH (ref 11.7–14.4)
Seg Neut %: 59.9 %
WBC: 90.6 10*3/uL — ABNORMAL HIGH (ref 4.0–10.0)

## 2015-04-18 LAB — HEMATOPATHOLOGY REVIEW

## 2015-04-18 LAB — DIFF MANUAL
Bands %: 18 % (ref 0–10)
Blasts %: 3 % — ABNORMAL HIGH (ref 0–0)
Diff Based On: 117 CELLS
Metamyelocyte %: 9 % — ABNORMAL HIGH (ref 0–1)
Myelocyte %: 4 % — ABNORMAL HIGH (ref 0–0)

## 2015-04-18 LAB — NEUTROPHIL #-INSTRUMENT: Neutrophil #-Instrument: 59.6 10*3/uL

## 2015-04-18 LAB — TYPE AND SCREEN
ABO RH Blood Type: A POS
Antibody Screen: NEGATIVE

## 2015-04-18 LAB — LD, PL: LD, PL: 1518 U/L — ABNORMAL HIGH (ref 118–225)

## 2015-04-18 MED ORDER — ACETAMINOPHEN 325 MG PO TABS *I*
650.0000 mg | ORAL_TABLET | Freq: Once | ORAL | Status: AC
Start: 2015-04-18 — End: 2015-04-18
  Administered 2015-04-18: 650 mg via ORAL

## 2015-04-18 MED ORDER — ACETAMINOPHEN 325 MG PO TABS *I*
ORAL_TABLET | ORAL | Status: AC
Start: 2015-04-18 — End: 2015-04-18
  Filled 2015-04-18: qty 2

## 2015-04-18 MED ORDER — MEDS & ORDERS EXIST IN BLOOD ADMIN PLAN *I*
Status: DC
Start: 2015-04-18 — End: 2015-06-06

## 2015-04-18 MED ORDER — ACETAMINOPHEN 325 MG PO TABS *I*
ORAL_TABLET | ORAL | Status: AC
Start: 2015-04-18 — End: 2015-04-18
  Filled 2015-04-18: qty 1

## 2015-04-18 MED ORDER — SODIUM CHLORIDE 0.9 % IV SOLN WRAPPED *I*
30.0000 mL/h | Status: DC | PRN
Start: 2015-04-18 — End: 2015-04-18

## 2015-04-18 NOTE — Telephone Encounter (Signed)
Let Elaya know that she can come in today at 11am for 1 unit of blood.   Good understanding.

## 2015-04-18 NOTE — Progress Notes (Signed)
PIV placed, flushed with ease, blood return noted pre and post infusion.  Pt received 1UPRBC, tolerated well, piv removed, pt discharged to home

## 2015-04-18 NOTE — Telephone Encounter (Signed)
Critical lab value called in:     Name of provider:Dr Carmie End Likely NP     Time provider notified:8:23     Time provider responded:       Critical lab and value: bands 18     Action taken as result of notification:

## 2015-04-18 NOTE — Telephone Encounter (Signed)
Talked to Carly Higgins to discuss her lab results. Her hct is 24% and she is feeling tired, but she does not want a transfusion at this time.   I let her know that the transfusion would help her fatigue but the infusion center is having trouble getting an IV in her over the weekend for her treatment and she does not want to get stuck again. I let her know that she needs to call us if she is having shortness of breath, chest pain or increased fatigue. She has an appointment with [name] in BMT clinic on Thursday. I told her to get her labs checked again on Thursday. She was in agreement with the plan.

## 2015-04-19 LAB — RED BLOOD CELLS
Coded Blood type: 6200
Component blood type: A POS
Dispense status: TRANSFUSED

## 2015-04-21 ENCOUNTER — Ambulatory Visit: Payer: Self-pay

## 2015-04-21 ENCOUNTER — Ambulatory Visit: Payer: Self-pay | Admitting: Hematology

## 2015-04-21 VITALS — BP 155/72 | HR 100 | Temp 97.3°F | Resp 16 | Ht 65.75 in | Wt 261.6 lb

## 2015-04-21 DIAGNOSIS — D471 Chronic myeloproliferative disease: Secondary | ICD-10-CM

## 2015-04-21 LAB — PROTIME-INR
INR: 1 (ref 0.9–1.1)
Protime: 11.4 s (ref 10.0–12.9)

## 2015-04-21 NOTE — Patient Instructions (Signed)
April 2017   Sunday Monday Tuesday Wednesday Thursday Friday Saturday                                 1       2     3     4   5      6      7     8       9     10     11     12     13   14     15       16     17     18     19     20     ECHOCARDIOGRAM    2:30 PM  [facility] elev[facility]   Main lobby   Ground floor      PULMONARY FUNCTION TEST    3:30 PM  [facility] elevators   3rd floor  21     22       23     24     25     26     27     28     29       30 

## 2015-04-21 NOTE — Progress Notes (Signed)
Subjective:      Patient ID: BECKI CORNELY is a 55 y.o. 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 By [employer] testing, she has mutations of TET2, ASXL1, CCND2, GATA2, SRSF2.       Treatment History:  1.  Decitabine 20 mg/m2 IV daily x 5 days - Cycle # 1 12/15/14  2.  Intermittent/dose adjusted Hydroxyurea    HPI  Jazminn comes to the office today for her scheduled follow up.  She had a great time in [facility] and came to the office with her sister today.  She completed decitabine last week, but the nurses could not give the 5th dose due to IV problems. She is taking 5 hydroxyurea tablets today with no mouth sores. No fevers, bruises, or bleeding.   Review of Systems   All other systems reviewed and are negative.    Objective:     Vitals:    04/21/15 1531   BP: 155/72   Pulse: 100   Resp: 16   Temp: 36.3 C (97.3 F)   Weight: 118.6 kg (261 lb 9.2 oz)   Height: 167 cm (5' 5.75")     Laboratory Data:  Lab Results   Component Value Date/Time    WBC 90.6 (H) 04/18/2015 07:11 AM    RBC 2.6 (L) 04/18/2015 07:11 AM    HGB 7.8 (L) 04/18/2015 07:11 AM    HCT 24 (L) 04/18/2015 07:11 AM    MCV 91 04/18/2015 07:11 AM    RDW 20.4 (H) 04/18/2015 07:11 AM    PLT 32 (L) 04/18/2015 07:11 AM    SEGR 59.9 04/18/2015 07:11 AM    LYMPR 6.0 04/18/2015 07:11 AM    BAND 18 (HH) 04/18/2015 07:11 AM    MONOR 0.0 04/18/2015 07:11 AM    EOSR 0.0 04/18/2015 07:11 AM    BASOR 0.0 04/18/2015 07:11 AM    ASEGR 70.7 (H) 04/18/2015 07:11 AM    ALYMR 5.4 (H) 04/18/2015 07:11 AM    AMONR 0.0 (L) 04/18/2015 07:11 AM    AEOSR 0.0 04/18/2015 07:11 AM    ABASR 0.0 04/18/2015 07:11 AM     Physical Exam   Constitutional: She is oriented to person, place, and time. Came with sister.   Tanned.   HENT:   Mouth/Throat: Oropharynx is clear and moist. No oropharyngeal exudate. No ulcers.   Eyes: No scleral icterus.   Neck: Neck supple.   Cardiovascular: Normal rate and regular rhythm.    Pulmonary/Chest:  Effort normal and breath sounds normal.   Abdominal: Soft. Bowel sounds are normal.   No palpable organomegaly.   Neurological: She is alert and oriented to person, place, and time.   Skin: Skin is warm and dry. No rash noted.     Assessment:   BINA SAVIANO is a 55 y.o. 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 By [employer] testing, she has mutations of TET2, ASXL1, CCND2, GATA2, SRSF2.   WBC remains elevated; will repeat blood work today.     Plan:     1.  Continue Hydroxyurea 1,000 mg BID.  Will continue to check labs twice weekly and adjust dose accordingly.  2.  Will transfuse as neede.   3.  cycle # 5 Decitabine started 04/12/15.  Compazine prn for nausea.  4.  We are calling forward a 10/10 female URD for transplant.  We will hope to begin conditioning in early May.  Altovise's parents are coming up from  [place], so I will meet with her and all her family to review transplant in detail prior to that.  She will have PFTs, ECHO, dental clearance prior.  No signs of AML transformation, so no need for repeat marrow at present.  Will continue adjusting dose of hydroxyurea per counts; will try to have WBC controlled prior to beginning SCT.  Will continue allopurinol     General transplant plans discussed with Sherion and her sister again today.  Will review these further at next visit.      Cheron Schaumann, MD

## 2015-04-21 NOTE — Procedures (Signed)
A user error has taken place: encounter opened in error, closed for administrative reasons.

## 2015-04-21 NOTE — Progress Notes (Signed)
BMT PSYCHOSOCIAL EVALUATION    Ms. Eidem presents to the BMT clinic to meet with [name] for consideration of an unrelated allogeneic hematopoietic stem cell transplant for treatment of myeloproliferative neoplasm.  She was accompanied to the appointment today by her sister.    DEMOGRAPHICS: Ms. Washer is a 55 year old Caucasian female who is single, without children.  She has 1 sister, and 1 brother.  Both of her parents are alive.    CITIZEN[place]IP:  [place]    RESIDENCE/HOUSING:   Ms. Lores lives with her sister in [place], [place].  There are 5 steps to enter the home.  The bedroom and bathroom are on the second floor, where there are 13 steps to the second floor.  She will be staying with her parents for several weeks after transplant.  The patient lives within a commutable distance from the hospital, and will not require access local lodging.  She does not have any concerns about her home situation, or about post transplant supports.    TRANSPORTATION:  Ms. Bargiel is independent with transportation at the moment.  In the event that she cannot provide for her own transportation family is available to assist.  There are no concerns about having access to transportation throughout the course of her treatment and recovery.    EDUCATION/EMPLOYMENT:  Ms. Hakes is a high school graduate.  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  She has been educated about disability options including short term disability, long term disability, and Social Security Disability.      PRIMARY LANGUAGE:  English.    MENTAL STATUS CHANGES:  Ms. Neiss is alert and oriented x3, and is easily engaged in conversation.  She is casually dressed, and is well groomed.  She has completed the distress screen and notes that her distress level is presently at a 3/10 on the distress thermometer.  She acknowledges some fears, nervousness, and worry, but denies any depressive symptoms or adjustment issues at this time.  She acknowledges a past history  with depression, and notes that she has successfully been using Lexapro to assist with anxiety.  Social work will continue to follow providing psychosocial support, and ongoing assessment of the patient's coping and adjustment.      DIAGNOSIS:  Ms. Hackbarth is diagnosed with MPN, and is here for consideration of an allogeneic hematopoietic stem cell transplant.    CURRENT ADLs:  Ms. Kavanagh is currently independent with all of her ADL needs.  There is no indication that this should change significantly over the course of her treatment and recovery.  In the event that she may need additional assistance her family is  available and willing to help.    HOBBIES/INTERESTS:  Ms. Buxbaum enjoys the following hobbies staying [place] pool[place]lake, going to festivals, and swimming.    MEDICAL HISTORY/PROBLEMS:  Ms. Nuchols has a past medical history which is noted for GERD, SOB, and hx low back pain requiring injections; she has a past surgical history noted for rotator cuff repair.  She is a smoker (trying to quit, she occasional uses of alcohol, she denies use of illicit drugs.  She has no known allergies.    INSURANCE:  Ms. Beres has health insurance coverage through [employer].  She does not have any concerns about coverage.  She has been educated about resources that may be available to them through the [facility], and the [place] [facility].  The patient will follow  up with social as needed for referrals.  She has   been approved for Financial Assistance through [facility].    PRIMARY SUPPORTS:  Ms. Hartfield identifies her primary support to be derived from her sister.  She has additional supports available through parents, brother, and friends.    SPIRITUALITY/BELIEFS:  Ms. Knoedler states that spirituality plays a role in her coping and adjustment with this illness.    ACTIONS:  Ms. Moustafa does not have a health care proxy on file in her electronic medical  record.  She has been educated about the role of the proxy, and is aware that social work is available to assist with completing a proxy should she need assistance.    FINANCIAL COORDINATOR:  Leontine Locket     BMT NURSE COORDINATOR:  Verlin Fester, RN/Sharon Swift, RN    IMPRESSIONS:  Ms. Forsey presents to the BMT clinic to meet with [name] for consideration of an unrelated allogeneic hematopoietic stem cell transplant for treatment of myeloproliferative neoplasm.  She was accompanied to the appointment today by her sister.  She is a 55 year old Caucasian female who is single, without children.  She has 1 sister, and 1 brother.  Both of her parents are alive.    She lives with her sister in [place], [place].  There are 5 steps to enter the home.  The bedroom and bathroom are on the second floor, where there are 13 steps to the second floor.  She will be staying with her parents for several weeks after transplant.  The patient lives within a commutable distance from the hospital, and will not require access local lodging.  She does not have any concerns about her home situation, or about post transplant supports.  Ms. Privett is independent with transportation at the moment.  In the event that she cannot provide for her own transportation family is available to assist.  There are no concerns about having access to transportation throughout the course of her treatment and recovery.     Ms. Cedotal is alert and oriented x3, and is easily engaged in conversation.  She is casually dressed, and is well groomed.  She has completed the distress screen and notes that her distress level is presently at a 3/10 on the distress thermometer.  She acknowledges some fears, nervousness, and worry, but denies any depressive symptoms or adjustment issues at this time.  She acknowledges a past history with depression, and notes that she has successfully been using Lexapro to assist with anxiety.  Social work will continue to follow  providing psychosocial support, and ongoing assessment of the patient's coping and adjustment.      She has been educated about resources that may be available to her.  These resources include, but are not limited to the [place], [place] [facility], B[facility] [employer], the [facility] Financial Assistance Program, and the Social Security Administration.  The patient is already connected with [facility] FAP, and is not asking for referral to other resources at this time.  Social work will continue to assess her needs and we will refer her to resources and supports as needed.    Ms. Olkowski relates having a relatively low emotional distress level at 3/10.  She is alert and oriented x3 and is easily engaged in conversation.  She does not endorse any significant issues or concerns at this time.  She is aware of the role of social work and will follow  up as needed.  Social work will continue to follow providing psychosocial support, resource mobilization, and ongoing assessment of her coping and adjustment.  Should any needs arise prior to her next visit please notify social work so that I may provide assistance at that time.      Marita Snellen, LMSW  Blood & Marrow Transplant Social Worker  [phone]

## 2015-04-22 LAB — HEPATITIS B & C PROF
HBV Core Ab: NEGATIVE
HBV S Ab Quant: 0.61 m[IU]/mL
HBV S Ab: NEGATIVE
HBV S Ag: NEGATIVE
Hep C Ab: NEGATIVE

## 2015-04-22 LAB — HEPATITIS A IGG AB: Hepatitis A IGG: NEGATIVE

## 2015-04-25 ENCOUNTER — Telehealth: Payer: Self-pay

## 2015-04-25 ENCOUNTER — Ambulatory Visit: Payer: Self-pay | Admitting: Primary Care

## 2015-04-25 ENCOUNTER — Ambulatory Visit
Admission: RE | Admit: 2015-04-25 | Discharge: 2015-04-25 | Disposition: A | Discharge status: Home / Self Care | Payer: Self-pay | Source: Ambulatory Visit | Attending: Hematology | Admitting: Hematology

## 2015-04-25 VITALS — BP 132/71 | HR 80 | Temp 98.8°F | Resp 18

## 2015-04-25 DIAGNOSIS — D72829 Elevated white blood cell count, unspecified: Secondary | ICD-10-CM

## 2015-04-25 DIAGNOSIS — D471 Chronic myeloproliferative disease: Secondary | ICD-10-CM

## 2015-04-25 LAB — CBC AND DIFFERENTIAL
Baso # K/uL: 0 10*3/uL (ref 0.0–0.1)
Basophil %: 0 %
Eos # K/uL: 0 10*3/uL (ref 0.0–0.4)
Eosinophil %: 0 %
Hematocrit: 22 % — ABNORMAL LOW (ref 34–45)
Hemoglobin: 7.1 g/dL — ABNORMAL LOW (ref 11.2–15.7)
Lymph # K/uL: 1.7 10*3/uL (ref 1.2–3.7)
Lymphocyte %: 12.2 %
MCH: 29 pg/cell (ref 26–32)
MCHC: 33 g/dL (ref 32–36)
MCV: 88 fL (ref 79–95)
Mono # K/uL: 0 10*3/uL — ABNORMAL LOW (ref 0.2–0.9)
Monocyte %: 0 %
Neut # K/uL: 11.6 10*3/uL — ABNORMAL HIGH (ref 1.6–6.1)
Nucl RBC # K/uL: 0.1 10*3/uL — ABNORMAL HIGH (ref 0.0–0.0)
Nucl RBC %: 0.3 /100 WBC — ABNORMAL HIGH (ref 0.0–0.2)
Platelets: 10 10*3/uL — CL (ref 160–370)
RBC: 2.5 MIL/uL — ABNORMAL LOW (ref 3.9–5.2)
RDW: 18.6 % — ABNORMAL HIGH (ref 11.7–14.4)
Seg Neut %: 69.6 %
WBC: 14.5 10*3/uL — ABNORMAL HIGH (ref 4.0–10.0)

## 2015-04-25 LAB — COMPREHENSIVE METABOLIC PANEL, PL
ALT, PL: 23 U/L (ref 0–35)
AST, PL: 25 U/L (ref 0–35)
Albumin, PL: 4.3 g/dL (ref 3.5–5.2)
Alk Phos, PL: 64 U/L (ref 35–105)
Anion Gap,PL: 13 (ref 7–16)
Bilirubin Total, PL: 0.5 mg/dL (ref 0.0–1.2)
CO2,Plasma: 25 mmol/L (ref 20–28)
Calcium, PL: 9.1 mg/dL (ref 8.6–10.2)
Chloride,Plasma: 103 mmol/L (ref 96–108)
Creatinine: 0.74 mg/dL (ref 0.51–0.95)
GFR,Black: 106 *
GFR,Caucasian: 92 *
Glucose,Plasma: 152 mg/dL — ABNORMAL HIGH (ref 60–99)
Potassium,Plasma: 4.4 mmol/L (ref 3.4–4.7)
Sodium,Plasma: 141 mmol/L (ref 133–145)
Total Protein, PL: 7.1 g/dL (ref 6.3–7.7)
UN,Plasma: 21 mg/dL — ABNORMAL HIGH (ref 6–20)

## 2015-04-25 LAB — DIFF MANUAL
Bands %: 10 % (ref 0–10)
Blasts %: 2 % — ABNORMAL HIGH (ref 0–0)
Diff Based On: 115 CELLS
Metamyelocyte %: 2 % — ABNORMAL HIGH (ref 0–1)
Myelocyte %: 5 % — ABNORMAL HIGH (ref 0–0)

## 2015-04-25 LAB — TYPE AND SCREEN
ABO RH Blood Type: A POS
Antibody Screen: NEGATIVE

## 2015-04-25 LAB — HEPATITIS C RNA, QUANTITATIVE, PCR
HCV PCR log: NEGATIVE IU/mL
HCV PCR: NEGATIVE IU/mL

## 2015-04-25 LAB — NEUTROPHIL #-INSTRUMENT: Neutrophil #-Instrument: 9.8 10*3/uL

## 2015-04-25 LAB — LD, PL: LD, PL: 414 U/L — ABNORMAL HIGH (ref 118–225)

## 2015-04-25 MED ORDER — ACETAMINOPHEN 325 MG PO TABS *I*
ORAL_TABLET | ORAL | Status: AC
Start: 2015-04-25 — End: 2015-04-26
  Filled 2015-04-25: qty 2

## 2015-04-25 MED ORDER — ACETAMINOPHEN 325 MG PO TABS *I*
650.0000 mg | ORAL_TABLET | Freq: Once | ORAL | Status: AC
Start: 2015-04-25 — End: 2015-04-25
  Administered 2015-04-25: 650 mg via ORAL

## 2015-04-25 NOTE — Progress Notes (Signed)
Patient arrived to clinic in stable condition to receive one unit of PRBC's and one unit of platelets for a Hct of 22, Plt 10 - reports generalized fatigue, hoping transfusions will help. Patient tolerated transfusions well without complication. PIV provided blood return before and after treatment. Encouraged to call with questions/concerns. Discharged in stable condition, ambulatory.

## 2015-04-25 NOTE — Telephone Encounter (Signed)
Talked to Carly Higgins. Let her know her wbc is 14.5, Hct is 22% and platelets are 10K. She did feel tired this weekend, denies bleeding.   She will get 1 unit of blood and platelets today at 1pm. I let her know [name] wants her to decrease hydroxyurea to 500mg  per day and recheck labs on Thursday or Friday.   Good understanding.

## 2015-04-25 NOTE — Telephone Encounter (Signed)
Critical lab value called in: Plt     Name of provider:  Likly,K NP     Time provider notified:  0828     Time provider responded:  0905     Critical lab and value:  Plt 10,000     Action taken as result of notification:  Provider informed

## 2015-04-26 ENCOUNTER — Ambulatory Visit
Admission: RE | Admit: 2015-04-26 | Discharge: 2015-04-26 | Disposition: A | Discharge status: Home / Self Care | Payer: Self-pay | Source: Ambulatory Visit | Attending: Hematology | Admitting: Hematology

## 2015-04-26 LAB — BMT PRE-RECIPIENT TESTING
CMV IgG: NEGATIVE
EBV IgG: POSITIVE
HIV 1&2 ANTIGEN/ANTIBODY: NONREACTIVE
HSV 1 IgG: 2.49 IV
HSV 2 IgG: 0.19 IV
HTLV I/II Ab: NONREACTIVE
Parvovirus B19 IgG: NEGATIVE
Syphilis Screen: NEGATIVE
Syphilis Status: NONREACTIVE
Toxoplasma IgG: 0 IU/mL
VZV IgG: POSITIVE

## 2015-04-26 LAB — OCCULT BLOOD X 1, STOOL
Card 1 Time: 5.3
Occult Blood 1: POSITIVE — AB

## 2015-04-27 ENCOUNTER — Encounter: Payer: Self-pay | Admitting: Gastroenterology

## 2015-04-27 LAB — RED BLOOD CELLS
Coded Blood type: 6200
Component blood type: A POS
Dispense status: TRANSFUSED

## 2015-04-27 LAB — PLATELETS,PHERESIS
Coded Blood type: 6200
Component blood type: A POS
Dispense status: TRANSFUSED

## 2015-04-28 ENCOUNTER — Ambulatory Visit
Admission: RE | Admit: 2015-04-28 | Discharge: 2015-04-28 | Disposition: A | Discharge status: Home / Self Care | Payer: Self-pay | Source: Ambulatory Visit | Attending: Hematology | Admitting: Hematology

## 2015-04-28 ENCOUNTER — Ambulatory Visit
Admission: RE | Admit: 2015-04-28 | Discharge: 2015-04-28 | Disposition: A | Discharge status: Home / Self Care | Payer: Self-pay | Source: Ambulatory Visit | Attending: Cardiology | Admitting: Cardiology

## 2015-04-28 ENCOUNTER — Ambulatory Visit: Payer: Self-pay

## 2015-04-28 ENCOUNTER — Ambulatory Visit: Admission: RE | Admit: 2015-04-28 | Payer: Self-pay | Source: Ambulatory Visit | Admitting: Cardiology

## 2015-04-28 DIAGNOSIS — D471 Chronic myeloproliferative disease: Secondary | ICD-10-CM

## 2015-04-28 DIAGNOSIS — D72829 Elevated white blood cell count, unspecified: Secondary | ICD-10-CM

## 2015-04-28 LAB — CBC AND DIFFERENTIAL
Baso # K/uL: 0 10*3/uL (ref 0.0–0.1)
Basophil %: 0 %
Eos # K/uL: 0 10*3/uL (ref 0.0–0.4)
Eosinophil %: 0 %
Hematocrit: 26 % — ABNORMAL LOW (ref 34–45)
Hemoglobin: 8.2 g/dL — ABNORMAL LOW (ref 11.2–15.7)
Lymph # K/uL: 1.8 10*3/uL (ref 1.2–3.7)
Lymphocyte %: 10.4 %
MCH: 28 pg/cell (ref 26–32)
MCHC: 32 g/dL (ref 32–36)
MCV: 88 fL (ref 79–95)
Mono # K/uL: 0 10*3/uL — ABNORMAL LOW (ref 0.2–0.9)
Monocyte %: 0 %
Neut # K/uL: 12.3 10*3/uL — ABNORMAL HIGH (ref 1.6–6.1)
Nucl RBC # K/uL: 0.4 10*3/uL — ABNORMAL HIGH (ref 0.0–0.0)
Nucl RBC %: 2.2 /100 WBC — ABNORMAL HIGH (ref 0.0–0.2)
Platelets: 28 10*3/uL — ABNORMAL LOW (ref 160–370)
RBC: 2.9 MIL/uL — ABNORMAL LOW (ref 3.9–5.2)
RDW: 18.7 % — ABNORMAL HIGH (ref 11.7–14.4)
Seg Neut %: 64.5 %
WBC: 18.3 10*3/uL — ABNORMAL HIGH (ref 4.0–10.0)

## 2015-04-28 LAB — ECHO COMPLETE
Aortic Diameter (sinus of Valsalva): 3.35 cm
BMI: 42.6 kg/m2
BSA: 2.35 m2
Deceleration Time - MV: 231.21 ms
E/A ratio: 0.86
Heart Rate: 88 {beats}/min
Height: 65.748 in
LA Diameter BSA Index: 1.3 cm/m2
LA Diameter Height Index: 1.9 cm/m
LA Diameter: 3.12 cm
LA Systolic Vol BSA Index: 15.7 mL/m2
LA Systolic Vol Height Index: 22.2 mL/m
LA Systolic Volume: 37 mL
LV ASE Mass BSA Index: 68.4 gm/m2
LV ASE Mass Height 2.7 Index: 40.2 gm/m2.7
LV ASE Mass Height Index: 96.2 gm/m
LV ASE Mass: 160.7 gm
LV CO BSA Index: 2.02 L/min/m2
LV Cardiac Output: 4.75 L/min
LV Diastolic Volume Index: 37.4 mL/m2
LV Isovolumic Relaxation Time: 96.89 ms
LV Posterior Wall Thickness: 1.16 cm
LV SV - LVOT SV Diff: 1.56 mL
LV SV BSA Index: 23 mL/m2
LV SV Height Index: 32.3 mL/m
LV Septal Thickness: 1.11 cm
LV Stroke Volume: 54 mL
LV Systolic Volume Index: 14.5 mL/m2
LVED Diameter BSA Index: 1.8 cm/m2
LVED Diameter Height Index: 2.5 cm/m
LVED Diameter: 4.14 cm
LVED Volume BSA Index: 37 ml/m2
LVED Volume BSA Index: 37.4 mL/m2
LVED Volume Height Index: 52.7 mL/m
LVED Volume: 88 mL
LVEF (Volume): 61 %
LVES Volume BSA Index: 14 ml/m2
LVES Volume BSA Index: 14.5 mL/m2
LVES Volume Height Index: 20.4 mL/m
LVES Volume: 34 mL
LVOT Area (calculated): 3.36 cm2
LVOT Cardiac Index: 1.96 L/min/m2
LVOT Cardiac Output: 4.61 L/min
LVOT Diameter: 2.07 cm
LVOT PWD VTI: 15.59 cm
LVOT PWD Velocity (mean): 70.4 cm/s
LVOT PWD Velocity (peak): 90.88 cm/s
LVOT SV BSA Index: 22.31 mL/m2
LVOT SV Height Index: 31.4 mL/m
LVOT Stroke Rate (mean): 236.8 mL/s
LVOT Stroke Rate (peak): 305.7 mL/s
LVOT Stroke Volume: 52.44 cc
MR Regurgitant Fraction (LV SV Mtd): 0.03
MR Regurgitant Volume (LV SV Mtd): 1.6 mL
MV Peak A Velocity: 64.77 cm/s
MV Peak E Velocity: 55.71 cm/s
Mitral Annular E/Ea Vel Ratio: 11.8
Mitral Annular Ea Velocity: 4.72 cm/s
RR Interval: 681.82 ms
Weight: 4183.45 oz

## 2015-04-28 LAB — COMPREHENSIVE METABOLIC PANEL, PL
ALT, PL: 22 U/L (ref 0–35)
AST, PL: 35 U/L (ref 0–35)
Albumin, PL: 4.3 g/dL (ref 3.5–5.2)
Alk Phos, PL: 67 U/L (ref 35–105)
Anion Gap,PL: 18 — ABNORMAL HIGH (ref 7–16)
Bilirubin Total, PL: 0.4 mg/dL (ref 0.0–1.2)
CO2,Plasma: 23 mmol/L (ref 20–28)
Calcium, PL: 9.3 mg/dL (ref 8.6–10.2)
Chloride,Plasma: 100 mmol/L (ref 96–108)
Creatinine: 0.71 mg/dL (ref 0.51–0.95)
GFR,Black: 111 *
GFR,Caucasian: 96 *
Glucose,Plasma: 127 mg/dL — ABNORMAL HIGH (ref 60–99)
Potassium,Plasma: 4.4 mmol/L (ref 3.4–4.7)
Sodium,Plasma: 141 mmol/L (ref 133–145)
Total Protein, PL: 7.4 g/dL (ref 6.3–7.7)
UN,Plasma: 15 mg/dL (ref 6–20)

## 2015-04-28 LAB — NEUTROPHIL #-INSTRUMENT: Neutrophil #-Instrument: 11.2 10*3/uL

## 2015-04-28 LAB — TYPE AND SCREEN
ABO RH Blood Type: A POS
Antibody Screen: NEGATIVE

## 2015-04-28 LAB — LD, PL: LD, PL: 626 U/L — ABNORMAL HIGH (ref 118–225)

## 2015-04-29 ENCOUNTER — Telehealth: Payer: Self-pay | Admitting: Hematology

## 2015-04-29 ENCOUNTER — Telehealth: Payer: Self-pay

## 2015-04-29 LAB — DIFF MANUAL
Bands %: 2 % (ref 0–10)
Blasts %: 2 % — ABNORMAL HIGH (ref 0–0)
Diff Based On: 115 CELLS
Metamyelocyte %: 6 % — ABNORMAL HIGH (ref 0–1)
Myelocyte %: 14 % — ABNORMAL HIGH (ref 0–0)
Promyelocyte %: 2 % — ABNORMAL HIGH (ref 0–0)

## 2015-04-29 NOTE — Telephone Encounter (Signed)
Talked to Denzil, let her know her blood counts looks good. She dose have some fatigue and nausea.   She denies having a fever. She is going to try to take some zofran to help with the nausea.   Good understanding.

## 2015-04-29 NOTE — Telephone Encounter (Signed)
Kim from the [facility] is calling preliminary results on the PFT completed yesterday. She is requesting it is faxed to [phone]

## 2015-04-29 NOTE — Telephone Encounter (Signed)
Message given to PFT lab staff.

## 2015-05-02 ENCOUNTER — Telehealth: Payer: Self-pay

## 2015-05-02 ENCOUNTER — Ambulatory Visit: Payer: Self-pay

## 2015-05-02 ENCOUNTER — Ambulatory Visit
Admission: RE | Admit: 2015-05-02 | Discharge: 2015-05-02 | Disposition: A | Discharge status: Home / Self Care | Payer: Self-pay | Source: Ambulatory Visit | Attending: Hematology | Admitting: Hematology

## 2015-05-02 VITALS — BP 132/61 | HR 85 | Temp 97.9°F | Resp 16 | Wt 263.8 lb

## 2015-05-02 DIAGNOSIS — D471 Chronic myeloproliferative disease: Secondary | ICD-10-CM

## 2015-05-02 DIAGNOSIS — D72829 Elevated white blood cell count, unspecified: Secondary | ICD-10-CM

## 2015-05-02 LAB — TYPE AND SCREEN
ABO RH Blood Type: A POS
Antibody Screen: NEGATIVE

## 2015-05-02 LAB — CBC AND DIFFERENTIAL
Baso # K/uL: 0 10*3/uL (ref 0.0–0.1)
Basophil %: 0 %
Eos # K/uL: 0 10*3/uL (ref 0.0–0.4)
Eosinophil %: 0 %
Hematocrit: 23 % — ABNORMAL LOW (ref 34–45)
Hemoglobin: 7.1 g/dL — ABNORMAL LOW (ref 11.2–15.7)
Lymph # K/uL: 1.1 10*3/uL — ABNORMAL LOW (ref 1.2–3.7)
Lymphocyte %: 5.2 %
MCH: 28 pg/cell (ref 26–32)
MCHC: 31 g/dL — ABNORMAL LOW (ref 32–36)
MCV: 90 fL (ref 79–95)
Mono # K/uL: 0 10*3/uL — ABNORMAL LOW (ref 0.2–0.9)
Monocyte %: 0 %
Neut # K/uL: 14.6 10*3/uL — ABNORMAL HIGH (ref 1.6–6.1)
Nucl RBC # K/uL: 0.3 10*3/uL — ABNORMAL HIGH (ref 0.0–0.0)
Nucl RBC %: 1.3 /100 WBC — ABNORMAL HIGH (ref 0.0–0.2)
Platelets: 40 10*3/uL — ABNORMAL LOW (ref 160–370)
RBC: 2.5 MIL/uL — ABNORMAL LOW (ref 3.9–5.2)
RDW: 20.7 % — ABNORMAL HIGH (ref 11.7–14.4)
Seg Neut %: 63 %
WBC: 20.9 10*3/uL — ABNORMAL HIGH (ref 4.0–10.0)

## 2015-05-02 LAB — COMPREHENSIVE METABOLIC PANEL, PL
ALT, PL: 22 U/L (ref 0–35)
AST, PL: 34 U/L (ref 0–35)
Albumin, PL: 4.2 g/dL (ref 3.5–5.2)
Alk Phos, PL: 73 U/L (ref 35–105)
Anion Gap,PL: 18 — ABNORMAL HIGH (ref 7–16)
Bilirubin Total, PL: 0.4 mg/dL (ref 0.0–1.2)
CO2,Plasma: 22 mmol/L (ref 20–28)
Calcium, PL: 9.1 mg/dL (ref 8.6–10.2)
Chloride,Plasma: 101 mmol/L (ref 96–108)
Creatinine: 0.73 mg/dL (ref 0.51–0.95)
GFR,Black: 107 *
GFR,Caucasian: 93 *
Glucose,Plasma: 164 mg/dL — ABNORMAL HIGH (ref 60–99)
Potassium,Plasma: 4.2 mmol/L (ref 3.4–4.7)
Sodium,Plasma: 141 mmol/L (ref 133–145)
Total Protein, PL: 7.2 g/dL (ref 6.3–7.7)
UN,Plasma: 15 mg/dL (ref 6–20)

## 2015-05-02 LAB — DIFF MANUAL
Bands %: 7 % (ref 0–10)
Blasts %: 3 % — ABNORMAL HIGH (ref 0–0)
Diff Based On: 116 CELLS
Metamyelocyte %: 2 % — ABNORMAL HIGH (ref 0–1)
Myelocyte %: 20 % — ABNORMAL HIGH (ref 0–0)

## 2015-05-02 LAB — NEUTROPHIL #-INSTRUMENT: Neutrophil #-Instrument: 12.3 10*3/uL

## 2015-05-02 LAB — LD, PL: LD, PL: 803 U/L — ABNORMAL HIGH (ref 118–225)

## 2015-05-02 MED ORDER — SODIUM CHLORIDE 0.9 % IV SOLN WRAPPED *I*
30.0000 mL/h | Status: DC | PRN
Start: 2015-05-02 — End: 2015-05-02

## 2015-05-02 MED ORDER — ACETAMINOPHEN 325 MG PO TABS *I*
650.0000 mg | ORAL_TABLET | Freq: Once | ORAL | Status: AC
Start: 2015-05-02 — End: 2015-05-02
  Administered 2015-05-02: 650 mg via ORAL

## 2015-05-02 MED ORDER — ACETAMINOPHEN 325 MG PO TABS *I*
ORAL_TABLET | ORAL | Status: AC
Start: 2015-05-02 — End: 2015-05-02
  Filled 2015-05-02: qty 2

## 2015-05-02 NOTE — Progress Notes (Signed)
Pt tolerated 1  unit PRBC's  well, no complaints.  Current blood consent in chart.  VS stable throughout transfusion, no symptoms of hypersensitivity reaction noted by pt or RN.  Good blood return obtained from PIV.  AVS declined, next appointments are scheduled.

## 2015-05-02 NOTE — Telephone Encounter (Signed)
Talked to Carly Higgins, let her know that her Hct is 23%, she is feeling a little bit of shortness of breath.   She does not want to get blood but with SOB and port placement tomorrow I let her know that it would be a good idea to get today.   She was in agreement, I let her know that I would call her back with a time. Good understanding.

## 2015-05-03 ENCOUNTER — Ambulatory Visit
Admission: RE | Admit: 2015-05-03 | Discharge: 2015-05-03 | Disposition: A | Discharge status: Home / Self Care | Payer: Self-pay | Source: Ambulatory Visit | Admitting: Hematology

## 2015-05-03 ENCOUNTER — Ambulatory Visit
Admission: RE | Admit: 2015-05-03 | Discharge: 2015-05-03 | Disposition: A | Discharge status: Home / Self Care | Payer: Self-pay | Source: Ambulatory Visit | Attending: Hematology | Admitting: Hematology

## 2015-05-03 ENCOUNTER — Encounter: Payer: Self-pay | Admitting: Hematology

## 2015-05-03 ENCOUNTER — Ambulatory Visit: Admission: RE | Admit: 2015-05-03 | Payer: Self-pay | Source: Ambulatory Visit | Admitting: Hematology

## 2015-05-03 HISTORY — DX: Gastro-esophageal reflux disease without esophagitis: K21.9

## 2015-05-03 HISTORY — DX: Shortness of breath: R06.02

## 2015-05-03 LAB — PROTIME-INR
INR: 1 (ref 0.9–1.1)
Protime: 11.6 s (ref 10.0–12.9)

## 2015-05-03 LAB — RED BLOOD CELLS
Coded Blood type: 6200
Component blood type: A POS
Dispense status: TRANSFUSED

## 2015-05-03 LAB — TYPE AND SCREEN
ABO RH Blood Type: A POS
Antibody Screen: NEGATIVE

## 2015-05-03 LAB — APTT: aPTT: 23.8 s — ABNORMAL LOW (ref 25.8–37.9)

## 2015-05-03 MED ORDER — MIDAZOLAM HCL 1 MG/ML IJ SOLN *I* WRAPPED
INTRAMUSCULAR | Status: AC
Start: 2015-05-03 — End: 2015-05-03
  Filled 2015-05-03: qty 4

## 2015-05-03 MED ORDER — LIDOCAINE HCL 1 % IJ SOLN *I*
INTRAMUSCULAR | Status: AC
Start: 2015-05-03 — End: 2015-05-03
  Filled 2015-05-03: qty 20

## 2015-05-03 MED ORDER — HEPARIN LOCK FLUSH 10 UNIT/ML IJ SOLN WRAPPED *I*
INTRAVENOUS | Status: AC
Start: 2015-05-03 — End: 2015-05-03
  Filled 2015-05-03: qty 5

## 2015-05-03 MED ORDER — HEPARIN LOCK FLUSH 10 UNIT/ML IJ SOLN WRAPPED *I*
INTRAVENOUS | Status: AC | PRN
Start: 2015-05-03 — End: 2015-05-03
  Administered 2015-05-03 (×2): 50 [IU]

## 2015-05-03 MED ORDER — COMPOUND BENZOIN TINCTURE EX SWAB *I*
CUTANEOUS | Status: AC
Start: 2015-05-03 — End: 2015-05-03
  Filled 2015-05-03: qty 1

## 2015-05-03 MED ORDER — MIDAZOLAM HCL 1 MG/ML IJ SOLN *I* WRAPPED
INTRAMUSCULAR | Status: AC | PRN
Start: 2015-05-03 — End: 2015-05-03
  Administered 2015-05-03 (×2): 1 mg via INTRAVENOUS
  Administered 2015-05-03: 2 mg via INTRAVENOUS

## 2015-05-03 MED ORDER — LIDOCAINE-EPINEPHRINE 1 %-1:100000 IJ SOLN *I*
INTRAMUSCULAR | Status: AC
Start: 2015-05-03 — End: 2015-05-03
  Filled 2015-05-03: qty 50

## 2015-05-03 MED ORDER — FENTANYL CITRATE 50 MCG/ML IJ SOLN *WRAPPED*
INTRAMUSCULAR | Status: AC
Start: 2015-05-03 — End: 2015-05-03
  Filled 2015-05-03: qty 4

## 2015-05-03 MED ORDER — SODIUM CHLORIDE 0.9 % IV SOLN WRAPPED *I*
30.0000 mL/h | Status: DC
Start: 2015-05-03 — End: 2015-05-03

## 2015-05-03 MED ORDER — FENTANYL CITRATE 50 MCG/ML IJ SOLN *WRAPPED*
INTRAMUSCULAR | Status: AC | PRN
Start: 2015-05-03 — End: 2015-05-03
  Administered 2015-05-03 (×3): 50 ug via INTRAVENOUS

## 2015-05-03 MED ORDER — CEFAZOLIN 1000 MG IN STERILE WATER 10ML SYRINGE *I*
PREFILLED_SYRINGE | INTRAVENOUS | Status: AC | PRN
Start: 2015-05-03 — End: 2015-05-03
  Administered 2015-05-03: 1000 mg via INTRAVENOUS

## 2015-05-03 MED ORDER — CEFAZOLIN IN D5W 1 GM/50ML IV SOLN *I*
INTRAVENOUS | Status: AC
Start: 2015-05-03 — End: 2015-05-03
  Filled 2015-05-03: qty 2

## 2015-05-03 MED ORDER — SODIUM CHLORIDE 0.9 % IV SOLN WRAPPED *I*
3.0000 mL/h | Status: DC
Start: 2015-05-03 — End: 2015-05-04

## 2015-05-03 NOTE — Progress Notes (Signed)
Chest xray cleared by Dr Gillian Scarce .

## 2015-05-03 NOTE — Discharge Instructions (Signed)
Interventional Radiology Mediport Placement Discharge Instructions    05/03/2015             12:08 PM     Provider performing test/procedure:Dr Saed    Patient given Medication: Yes. You have been given medicine, Versed and Fentanyl, that may make you sleepy. Do not drive, operate  heavy machinery, drink alcoholic beverages, make important personal or business decisions, or sign legal documents until the next day.    [] Implant card given to patient Yes    PATIENT INSTRUCTIONS   After the surgery, the incision will be covered with a gauze dressing. This dressing may be removed after 48 hours.      Under the dressing you will find several thin paper tapes (steri-strips) covering the incision. The steri-strips should be left in place.  The stitches are underneath the skin and will dissolve.      You may shower in 48 hours once the dressing is removed.     Do not scrub the incision. Simply let the water run over it and then gently pat it dry.       Do not submerge the incision for 1 week (no baths, hot tubs or swimming pools.)     If the port is accessed (a needle in place) at the time of placement, do not remove the needle. This will be done by the cancer/infusion center. It must be removed within 3 days.  Once the needle is removed, you may follow the above instructions.     You may resume your regular diet with your doctors permission.     You may resume all your medications as directed by your doctor, including blood thinners.    COMFORT MEASURES   For the first few days, it is common for the area around the incision to be swollen, discolored (black & blue), and sore. To help reduce swelling, apply an ice pack to the swollen area for 15 to 20 minutes every hour for 3 days or more as needed.      Wear loose, comfortable clothing.      We recommend the use of over-the-counter anti-inflammatory medication such as ibuprofen (Advil, Motrin, Aleve), if not allergic,  to minimize swelling, inflammation, and  pain. Take medication every 4 - 6 hours with food.      ACTIVITY   Lifting with arm on side of mediport should be restricted to 10-15 pounds or less for 2 weeks.    Avoid pushing, pulling, straining, or any strenuous activity.    You may climb stairs.   You may drive if not using narcotic pain medication and if you are comfortable enough behind the wheel and can safely operate the vehicle.    RETURN TO WORK   You may return to work, when you feel you are ready at any point after surgery if otherwise cleared by your doctor.    WHEN TO CALL THE OFFICE - Do not hesitate to call the office if you develop a fever  (temperature greater than 101), shaking chills, bleeding, increasing redness or drainage around the site.    Monday - Friday 8am-5pm: For questions or concerns regarding your procedure please call [facility] ([facility] [phone].    After hours/Weekends:  [facility] patients may call [phone] and ask to speak with the Radiology resident on call.   [facility] patients may call [phone] to speak with the [place] or call [phone] and ask to speak with  the Radiology resident on call.    The Emergency Department is open 24 hours a day at [facility] if emergency treatment is required.

## 2015-05-03 NOTE — Procedures (Signed)
Procedure Report    Ultrasound and fluoroscopy guided double lumen PowerPort placement.    PROCEDURE NOTE    Time out documentation completed in Procedure navigator prior to procedure:  Yes    Indications  Need port for BMT    Procedure Details  See radiology report for details.    Guide Wire Removed : yes    Findings  Successful placement of right internal jugular double-lumen PowerPort.    Complications  None    Condition  good    Plan/Orders  Post procedure chest radiograph.     EBL: 3 cc    Specimens  N/A    Disposition  To home    Haze Rushing, MD  05/03/2015  12:31 PM              Moderate Sedation Face Times  Start Time: [id number]  End Time: 1224  Duration (minutes): 67 Minutes

## 2015-05-03 NOTE — Progress Notes (Addendum)
[employer] Nursing Procedure Note    MODEST DUBICKI  [id number]    Procedure: mediport placement        Status: Completed    Patient tolerated procedure well     Specimen Collection: no    Sponge count: Yes. Pre- Procedure Count: 10.  Post- Procedure Count: 10.    Fluid Removed:N/A  Procedure Dressing Site located:Right IJ  Dressing Type:sterile gauze/tegaderm  Biopatch:no  Dressing status:Clean, dry and intact   Hematoma:Not evident  Medication received:Versed 4mg  and Fentanyl 163mcg  Cardiovascular:   Peripheral Pulses: N/A      Fistula: N/A    Neuro Assessment:Patient is at pre-procedure baseline    Implant patient information given to patient or parent/guardian:Yes    Report given [id number] recovery nurse. Name: andrea      Last Filed Vitals    05/03/15 1152   BP: 132/87   Pulse: 85   Resp: 22   Temp:    SpO2: 100%     O2 Device: Nasal cannula

## 2015-05-03 NOTE — Preop H&P (Signed)
OUTPATIENT  Chief Complaint: Need IVAD    History of Present Illness:  HPI: The patient is a 55 years-old female with a history of meyloprolieferative neoplasm presenting for double-lumen PowerPort placement.    Past Medical History:   Diagnosis Date    GERD (gastroesophageal reflux disease)     Shortness of breath      Past Surgical History:   Procedure Laterality Date    ROTATOR CUFF REPAIR Right      Family History   Problem Relation Age of Onset    No Known Problems Mother     No Known Problems Father      Social History     Social History    Marital status: Divorced     Spouse name: N/A    Number of children: N/A    Years of education: N/A     Social History Main Topics    Smoking status: Current Every Day Smoker     Packs/day: 0.50     Years: 30.00    Smokeless tobacco: None    Alcohol use Yes      Comment: occassional    Drug use: No    Sexual activity: Not Asked     Other Topics Concern    None     Social History Narrative       Allergies: No Known Allergies (drug, envir, food or latex)    Current Outpatient Prescriptions   Medication    hydroxyurea (HYDREA) 500 MG capsule    cyclobenzaprine (FLEXERIL) 10 MG tablet    prochlorperazine (COMPAZINE) 10 MG tablet    escitalopram (LEXAPRO) 10 MG tablet    Meds & Orders Exist in Blood Admin Plan    traMADol (ULTRAM) 50 MG tablet    naproxen (NAPROSYN) 500 MG tablet    allopurinol (ZYLOPRIM) 300 MG tablet     Current Facility-Administered Medications   Medication Dose Route Frequency    sodium chloride 0.9 % IV  30 mL/hr Intravenous Continuous    sodium chloride 0.9 % IV  3 mL/hr Intravenous Continuous        Review of Systems:   ROS  Reports mild shortness of breath and nausea  Denies chest pain or vomiting    Last Nursing documented pain:  0-10 Scale: 0 (05/03/15 0919)      Patient Vitals for the past 24 hrs:   BP Temp Temp src Pulse Resp SpO2 Height Weight   05/03/15 0919 116/67 35.8 C (96.4 F) TEMPORAL 85 16 98 % 167.6 cm (_0 ) 118.8  kg (262 lb)     O2 Device: None (Room air) (05/03/15 0919)      Physical Exam  GEN: NAD  NECK: Supple  CV: RRR  RESP: Clear auscultation bilaterally    Lab Results:   All labs in the last 72 hours:  Recent Results (from the past 72 hour(s))   Type and screen    Collection Time: 05/02/15  7:13 AM   Result Value Ref Range    ABO RH Blood Type A RH POS     Antibody Screen Negative    CBC and differential    Collection Time: 05/02/15  7:13 AM   Result Value Ref Range    WBC 20.9 (H) 4.0 - 10.0 THOU/uL    RBC 2.5 (L) 3.9 - 5.2 MIL/uL    Hemoglobin 7.1 (L) 11.2 - 15.7 g/dL    Hematocrit 23 (L) 34 - 45 %    MCV 90 79 -  95 fL    MCH 28 26 - 32 pg/cell    MCHC 31 (L) 32 - 36 g/dL    RDW 20.7 (H) 11.7 - 14.4 %    Platelets 40 (L) 160 - 370 THOU/uL    Seg Neut % 63.0 %    Lymphocyte % 5.2 %    Monocyte % 0.0 %    Eosinophil % 0.0 %    Basophil % 0.0 %    Neut # K/uL 14.6 (H) 1.6 - 6.1 THOU/uL    Lymph # K/uL 1.1 (L) 1.2 - 3.7 THOU/uL    Mono # K/uL 0.0 (L) 0.2 - 0.9 THOU/uL    Eos # K/uL 0.0 0.0 - 0.4 THOU/uL    Baso # K/uL 0.0 0.0 - 0.1 THOU/uL    Nucl RBC % 1.3 (H) 0.0 - 0.2 /100 WBC    Nucl RBC # K/uL 0.3 (H) 0.0 - 0.0 THOU/uL   Diff manual    Collection Time: 05/02/15  7:13 AM   Result Value Ref Range    Bands % 7 0 - 10 %    Metamyelocyte % 2 (H) 0 - 1 %    Myelocyte % 20 (H) 0 - 0 %    Blasts % 3 (H) 0 - 0 %    Giant PLTs Present     Manual DIFF RESULTS     Diff Based On 116 CELLS   Comprehensive Metabolic Panel, PL    Collection Time: 05/02/15  7:13 AM   Result Value Ref Range    Potassium,Plasma 4.2 3.4 - 4.7 mmol/L    Sodium,Plasma 141 133 - 145 mmol/L    Anion Gap,PL 18 (H) 7 - 16    UN,Plasma 15 6 - 20 mg/dL    Creatinine 0.73 0.51 - 0.95 mg/dL    GFR,Caucasian 93 *    GFR,Black 107 *    Glucose,Plasma 164 (H) 60 - 99 mg/dL    Calcium, PL 9.1 8.6 - 10.2 mg/dL    Chloride,Plasma 101 96 - 108 mmol/L    CO2,Plasma 22 20 - 28 mmol/L    Alk Phos, PL 73 35 - 105 U/L    AST, PL 34 0 - 35 U/L    ALT, PL 22 0 - 35 U/L     Albumin, PL 4.2 3.5 - 5.2 g/dL    Bilirubin Total, PL 0.4 0.0 - 1.2 mg/dL    Total Protein, PL 7.2 6.3 - 7.7 g/dL   LD, PL    Collection Time: 05/02/15  7:13 AM   Result Value Ref Range    LD, PL 803 (H) 118 - 225 U/L   Neutrophil #-Instrument    Collection Time: 05/02/15  7:13 AM   Result Value Ref Range    Neutrophil #-Instrument 12.3 THOU/uL   Red blood cells    Collection Time: 05/02/15 10:25 AM   Result Value Ref Range    Component blood type A Pos     Unit Number [id number]     Dispense status Transfused     Product code [id number]     Blood product type Red Blood Cells     Coding system [id number]     Coded Blood type 6200    Platelets,pheresis    Collection Time: 05/03/15  9:20 AM   Result Value Ref Range    Component blood type A Pos     Unit Number [id number]     Dispense status Issued  Product code [id number]     Blood product type Platelets,Pheresis     Coding system [id number]     Coded Blood type 6200    Protime-INR    Collection Time: 05/03/15  9:32 AM   Result Value Ref Range    Protime 11.6 10.0 - 12.9 sec    INR 1.0 0.9 - 1.1   APTT    Collection Time: 05/03/15  9:32 AM   Result Value Ref Range    aPTT 23.8 (L) 25.8 - 37.9 sec   Type and screen    Collection Time: 05/03/15  9:32 AM   Result Value Ref Range    ABO RH Blood Type A RH POS     Antibody Screen Negative        Radiology impressions (last 3 days):  No results found.    Currently Active/Followed Hospital Problems:  There are no active hospital problems to display for this patient.      Assessment/Plan: 55 years old female presenting for Mediport placement.    Author: Haze Rushing, MD  Note created: 05/03/2015  at: 11:01 AM

## 2015-05-04 LAB — PLATELETS,PHERESIS
Coded Blood type: 6200
Component blood type: A POS
Dispense status: TRANSFUSED

## 2015-05-05 ENCOUNTER — Ambulatory Visit: Payer: Self-pay | Admitting: Hematology

## 2015-05-05 VITALS — BP 153/74 | HR 102 | Temp 96.1°F | Resp 16 | Ht 65.75 in | Wt 263.4 lb

## 2015-05-05 DIAGNOSIS — D471 Chronic myeloproliferative disease: Secondary | ICD-10-CM

## 2015-05-05 NOTE — Progress Notes (Signed)
Subjective:      Patient ID: Carly Higgins is a 55 y.o. 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 By [employer] testing, she has mutations of TET2, ASXL1, CCND2, GATA2, SRSF2.       Treatment History:  1.  Decitabine 20 mg/m2 IV daily x 5 days - Cycle # 1 12/15/14  2.  Intermittent/dose adjusted Hydroxyurea; currently on 500 mg/day    HPI  Carly Higgins comes to the office today for her scheduled follow up.  She had her medi-port placed.   She brought her parents today who are back [place] after a winter in [place].   No new symptoms.   She is hoping to stop smoking.     Review of Systems   All other systems reviewed and are negative.    Current Outpatient Prescriptions   Medication Sig Dispense Refill    Meds & Orders Exist in Blood Admin Plan       traMADol (ULTRAM) 50 MG tablet       naproxen (NAPROSYN) 500 MG tablet Take 500 mg by mouth 2 times daily (with meals)      hydroxyurea (HYDREA) 500 MG capsule Take 1 capsule (500 mg total) by mouth daily 30 capsule 6    cyclobenzaprine (FLEXERIL) 10 MG tablet Take 10 mg by mouth 3 times daily as needed for Muscle spasms      allopurinol (ZYLOPRIM) 300 MG tablet Take 1 tablet (300 mg total) by mouth daily 10 tablet 0    prochlorperazine (COMPAZINE) 10 MG tablet Take 1 tablet (10 mg total) by mouth 4 times daily as needed for Nausea 40 tablet 6    escitalopram (LEXAPRO) 10 MG tablet Take 15 mg by mouth daily       No current facility-administered medications for this visit.          Objective:     Vitals:    05/05/15 1228   BP: 153/74   Pulse: 102   Resp: 16   Temp: 35.6 C (96.1 F)   Weight: 119.5 kg (263 lb 7.2 oz)   Height: 167 cm (5' 5.75")     Laboratory Data:  Lab Results   Component Value Date/Time    WBC 20.9 (H) 05/02/2015 07:13 AM    RBC 2.5 (L) 05/02/2015 07:13 AM    HGB 7.1 (L) 05/02/2015 07:13 AM    HCT 23 (L) 05/02/2015 07:13 AM    MCV 90 05/02/2015 07:13 AM    RDW 20.7 (H) 05/02/2015 07:13 AM     PLT 40 (L) 05/02/2015 07:13 AM    SEGR 63.0 05/02/2015 07:13 AM    LYMPR 5.2 05/02/2015 07:13 AM    BAND 7 05/02/2015 07:13 AM    MONOR 0.0 05/02/2015 07:13 AM    EOSR 0.0 05/02/2015 07:13 AM    BASOR 0.0 05/02/2015 07:13 AM    ASEGR 14.6 (H) 05/02/2015 07:13 AM    ALYMR 1.1 (L) 05/02/2015 07:13 AM    AMONR 0.0 (L) 05/02/2015 07:13 AM    AEOSR 0.0 05/02/2015 07:13 AM    ABASR 0.0 05/02/2015 07:13 AM     Physical Exam   Constitutional: She is oriented to person, place, and time.   Tanned.   HENT:   Mouth/Throat: Oropharynx is clear and moist. No oropharyngeal exudate. No ulcers.   Eyes: No scleral icterus.   Neck: Neck supple.   Cardiovascular: Normal rate and regular rhythm.    Pulmonary/Chest: Effort normal and breath sounds normal.  Abdominal: Soft. Bowel sounds are normal.   No palpable organomegaly.    Skin: Skin is warm and dry. No rash noted.     PFTs:  DLCO is 60% of predicted.   ECHO: Normal LVEF     Assessment:   Carly Higgins is a 55 y.o. 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 By [employer] testing, she has mutations of TET2, ASXL1, CCND2, GATA2, SRSF2.   She is ready to begin conditioning for stem cell transplantation next week,  A 10/10 unrelated donor has been cleared.   Today we reviewed the schema for transplant using busulfan and fludarabine conditioning  We reviewed side effects of these agents including alopecia and GI effects. We reviewed stem cell infusion, need for transfusions and antibioticc during nadir, expected engraftment time, and GVHD manifestations and prophlaxis.. We reviewed non-engraftment, infection risk, and possibility of relapse. She and her parents asked appropriate questions, and consent for the procedure was reviewed and signed. She also met with Verlin Fester today to review transplant course.     In the course of transplant discussion today, we discussed the fact that Carly Higgins's siblings had never been typed.  She  is willing to have them approached for this now, and while we are set to use an unrelated donor, knowing their status may be of help as GVHD risk might be less and DLI may be more easily acquired if ever needed.  At present, we will plan to continue on with the unrelated donor. Her siblings will have buccal swab typing sent. Previously, Carly Higgins had not wished to have her siblings typed, but that was at a different stage of her disease course.     Plan:     1.  Continue Hydroxyurea 1,000 mg per day with tiration as neede. .  2.  Will transfuse as needed.   3.   Will continue allopurinol     Current plan is for admission next week.  If one of siblings is a full match, we will reassess this plan.      Cheron Schaumann, MD

## 2015-05-09 ENCOUNTER — Other Ambulatory Visit
Admission: RE | Admit: 2015-05-09 | Discharge: 2015-05-09 | Disposition: A | Discharge status: Home / Self Care | Payer: Self-pay | Source: Ambulatory Visit | Attending: Hematology | Admitting: Hematology

## 2015-05-09 ENCOUNTER — Telehealth: Payer: Self-pay

## 2015-05-09 DIAGNOSIS — D471 Chronic myeloproliferative disease: Secondary | ICD-10-CM

## 2015-05-09 DIAGNOSIS — D72829 Elevated white blood cell count, unspecified: Secondary | ICD-10-CM

## 2015-05-09 LAB — CBC AND DIFFERENTIAL
Baso # K/uL: 0 10*3/uL (ref 0.0–0.1)
Basophil %: 0 %
Eos # K/uL: 0 10*3/uL (ref 0.0–0.4)
Eosinophil %: 0 %
Hematocrit: 25 % — ABNORMAL LOW (ref 34–45)
Hemoglobin: 8 g/dL — ABNORMAL LOW (ref 11.2–15.7)
Lymph # K/uL: 1.6 10*3/uL (ref 1.2–3.7)
Lymphocyte %: 3.5 %
MCH: 29 pg/cell (ref 26–32)
MCHC: 32 g/dL (ref 32–36)
MCV: 90 fL (ref 79–95)
Mono # K/uL: 0 10*3/uL — ABNORMAL LOW (ref 0.2–0.9)
Monocyte %: 0 %
Neut # K/uL: 24.6 10*3/uL — ABNORMAL HIGH (ref 1.6–6.1)
Nucl RBC # K/uL: 0.5 10*3/uL — ABNORMAL HIGH (ref 0.0–0.0)
Nucl RBC %: 1.1 /100 WBC — ABNORMAL HIGH (ref 0.0–0.2)
Platelets: 75 10*3/uL — ABNORMAL LOW (ref 160–370)
RBC: 2.8 MIL/uL — ABNORMAL LOW (ref 3.9–5.2)
RDW: 21.6 % — ABNORMAL HIGH (ref 11.7–14.4)
Seg Neut %: 47.8 %
WBC: 41 10*3/uL — ABNORMAL HIGH (ref 4.0–10.0)

## 2015-05-09 LAB — COMPREHENSIVE METABOLIC PANEL, PL
ALT, PL: 18 U/L (ref 0–35)
AST, PL: 39 U/L — ABNORMAL HIGH (ref 0–35)
Albumin, PL: 4.3 g/dL (ref 3.5–5.2)
Alk Phos, PL: 78 U/L (ref 35–105)
Anion Gap,PL: 15 (ref 7–16)
Bilirubin Total, PL: 0.5 mg/dL (ref 0.0–1.2)
CO2,Plasma: 25 mmol/L (ref 20–28)
Calcium, PL: 9.2 mg/dL (ref 8.6–10.2)
Chloride,Plasma: 102 mmol/L (ref 96–108)
Creatinine: 0.7 mg/dL (ref 0.51–0.95)
GFR,Black: 113 *
GFR,Caucasian: 98 *
Glucose,Plasma: 160 mg/dL — ABNORMAL HIGH (ref 60–99)
Potassium,Plasma: 4.6 mmol/L (ref 3.4–4.7)
Sodium,Plasma: 142 mmol/L (ref 133–145)
Total Protein, PL: 7.6 g/dL (ref 6.3–7.7)
UN,Plasma: 12 mg/dL (ref 6–20)

## 2015-05-09 LAB — DIFF MANUAL
Bands %: 12 % (ref 0–10)
Blasts %: 5 % — ABNORMAL HIGH (ref 0–0)
Diff Based On: 115 CELLS
Metamyelocyte %: 5 % — ABNORMAL HIGH (ref 0–1)
Myelocyte %: 20 % — ABNORMAL HIGH (ref 0–0)
Promyelocyte %: 6 % — ABNORMAL HIGH (ref 0–0)

## 2015-05-09 LAB — LD, PL: LD, PL: 1146 U/L — ABNORMAL HIGH (ref 118–225)

## 2015-05-09 LAB — TYPE AND SCREEN
ABO RH Blood Type: A POS
Antibody Screen: NEGATIVE

## 2015-05-09 LAB — NEUTROPHIL #-INSTRUMENT: Neutrophil #-Instrument: 21.5 10*3/uL

## 2015-05-09 NOTE — Progress Notes (Signed)
TREATMENT OUTLINE FOR ALLOGENEIC PBSC TRANSPLANT FOR MYELOPROLIFERATIVE DISEASE    Pt: Carly Higgins         MRN [id number]    DOB 1960/02/01    BMT DAY   DATE  PROCEDURE/THERAPY   -6   05/11/15 Admit to the Blood and Marrow Transplant Unit on [facility]   Draw 3 yellow top tubes for BMT Coordinator (page when drawn)   -5   05/12/15 Fludarabine 40 mg/m2 IV, infuse over 60 minutes  Busulfan 130 mg/m2 IV infuse over 3 hrs.    Draw Busulfan levels with first dose of Busulfan     -4   05/13/15 Fludarabine 40 mg/m2 IV over 60 min.  Busulfan 130 mg/m2 IV, infuse over 3 hours      -3   05/14/15 Fludarabine 40 mg/m2 IV over 60 min.  Busulfan 130 mg/m2 IV, infuse over 3 hrs.     -2   05/15/15 Fludarabine 40 mg/m2 IV over 60 min.                     Busulfan 130 mg/m2 IV over 3 hrs.     -1   05/16/15 Rest day     0   05/17/15 Infusion of allogeneic PBSC no sooner than 48 hours after completion of infusion of last chemotherapy dose.  Donor: 10/10 HLA matched 55y/o female  WCH:[id number]       DOSING SHOULD BE BASED OF THE ACTUAL BODY WEIGHT OR THE CORRECTED IDEAL BODY WEIGHT OF THE PATIENT, WHICHEVER IS THE LESSER OF THE TWO.  TO GET THE CORRECTED IDEAL BODY WEIGHT, YOU TAKE THE DIFFERENCE OF THE ACTUAL & IDEAL BODY WEIGHT.  MULTIPLY THAT NUMBER BY 25% AND ADD TO THE IDEAL BODY WEIGHT=CORRECTED IDEAL BODY WEIGHT.    *Admitting RN to fax final schema to Stem Cell Lab ([phone]) and Pharmacy:  RN/Date: ____________    Patient ABO  A pos CMV  neg Ht  cm Wt   kg Allergy      Donor  ABO   A pos CMV  neg Ht cm Wt   86 kg Allergy          PT A-01:01  A-01:01 B-35:02  B-57:01 C-04:01  C-06:02  DRB1-11:04   DRB1-14:01 DQB1-03:01  DQB1-05:03   Donor   A-01:01  A-01:01 B-35:02  B-57:01 C-04:01  C-06:02  DRB1-11:04   DRB1-14:01 DQB1-03:01  DQB1-05:03     If this schema changes, please call coordinator: Melodee Lupe/Sharon @ [phone]    WE:RXVQ [place]      MD Signature:____________________  Date:_________  SIGNED BY [name] 05/06/15  SIGNED COPY SENT TO [facility]

## 2015-05-09 NOTE — Telephone Encounter (Signed)
Let Carly Higgins know that her wbc is 40.1, hct 25% and platelets are 72K.   She is taking hydroxyurea 1000mg /day and will be admitted for bone marrow transplant on Wednesday.   Good understanding.

## 2015-05-09 NOTE — Telephone Encounter (Signed)
Critical lab value called in:  Band/WBC     Name of provider:  L. Wedow NP/S. Shafer RN     Time provider notified:  [phone]     Time provider responded:  0915/0916     Critical lab and value:  Band 12     WBC  41     Action taken as result of notification:  Provider notified

## 2015-05-11 ENCOUNTER — Inpatient Hospital Stay
Admission: RE | Admit: 2015-05-11 | Disposition: A | Discharge status: Home / Self Care | Payer: Self-pay | Source: Ambulatory Visit | Attending: Hematology | Admitting: Hematology

## 2015-05-11 ENCOUNTER — Encounter: Payer: Self-pay | Admitting: Hematology

## 2015-05-11 ENCOUNTER — Telehealth: Payer: Self-pay | Admitting: Hematology

## 2015-05-11 DIAGNOSIS — D471 Chronic myeloproliferative disease: Secondary | ICD-10-CM

## 2015-05-11 LAB — CBC AND DIFFERENTIAL
Baso # K/uL: 0 10*3/uL (ref 0.0–0.1)
Basophil %: 0 %
Eos # K/uL: 0 10*3/uL (ref 0.0–0.4)
Eosinophil %: 0 %
Hematocrit: 22 % — ABNORMAL LOW (ref 34–45)
Hemoglobin: 7.1 g/dL — ABNORMAL LOW (ref 11.2–15.7)
Lymph # K/uL: 2.6 10*3/uL (ref 1.2–3.7)
Lymphocyte %: 6.1 %
MCH: 30 pg/cell (ref 26–32)
MCHC: 33 g/dL (ref 32–36)
MCV: 90 fL (ref 79–95)
Mono # K/uL: 0.4 10*3/uL (ref 0.2–0.9)
Monocyte %: 0.9 %
Neut # K/uL: 27.1 10*3/uL — ABNORMAL HIGH (ref 1.6–6.1)
Nucl RBC # K/uL: 0.4 10*3/uL — ABNORMAL HIGH (ref 0.0–0.0)
Nucl RBC %: 0.9 /100 WBC — ABNORMAL HIGH (ref 0.0–0.2)
Platelets: 59 10*3/uL — ABNORMAL LOW (ref 160–370)
RBC: 2.4 MIL/uL — ABNORMAL LOW (ref 3.9–5.2)
RDW: 21.9 % — ABNORMAL HIGH (ref 11.7–14.4)
Seg Neut %: 53.5 %
WBC: 43.7 10*3/uL — ABNORMAL HIGH (ref 4.0–10.0)

## 2015-05-11 LAB — COMPREHENSIVE METABOLIC PANEL
ALT: 17 U/L (ref 0–35)
AST: 36 U/L — ABNORMAL HIGH (ref 0–35)
Albumin: 3.8 g/dL (ref 3.5–5.2)
Alk Phos: 73 U/L (ref 35–105)
Anion Gap: 17 — ABNORMAL HIGH (ref 7–16)
Bilirubin,Total: 0.4 mg/dL (ref 0.0–1.2)
CO2: 23 mmol/L (ref 20–28)
Calcium: 8.9 mg/dL (ref 8.6–10.2)
Chloride: 100 mmol/L (ref 96–108)
Creatinine: 0.98 mg/dL — ABNORMAL HIGH (ref 0.51–0.95)
GFR,Black: 75 *
GFR,Caucasian: 65 *
Glucose: 96 mg/dL (ref 60–99)
Lab: 14 mg/dL (ref 6–20)
Potassium: 4 mmol/L (ref 3.3–5.1)
Sodium: 140 mmol/L (ref 133–145)
Total Protein: 6.5 g/dL (ref 6.3–7.7)

## 2015-05-11 LAB — DIFF MANUAL
Bands %: 8 % (ref 0–10)
Blasts %: 3 % — ABNORMAL HIGH (ref 0–0)
Diff Based On: 114 CELLS
Metamyelocyte %: 12 % — ABNORMAL HIGH (ref 0–1)
Myelocyte %: 12 % — ABNORMAL HIGH (ref 0–0)
Promyelocyte %: 4 % — ABNORMAL HIGH (ref 0–0)

## 2015-05-11 LAB — CHIMERISM

## 2015-05-11 LAB — PROTIME-INR
INR: 1.1 (ref 0.9–1.1)
Protime: 12 s (ref 10.0–12.9)

## 2015-05-11 LAB — URIC ACID: Urate: 5.6 mg/dL (ref 2.7–6.8)

## 2015-05-11 LAB — PREALBUMIN: Prealbumin: 19 mg/dL — ABNORMAL LOW (ref 20–40)

## 2015-05-11 LAB — MAGNESIUM: Magnesium: 1.8 mEq/L (ref 1.3–2.1)

## 2015-05-11 LAB — LACTATE DEHYDROGENASE: LD: 1069 U/L — ABNORMAL HIGH (ref 118–225)

## 2015-05-11 LAB — PHOSPHORUS: Phosphorus: 3.9 mg/dL (ref 2.7–4.5)

## 2015-05-11 LAB — APTT: aPTT: 26.5 s (ref 25.8–37.9)

## 2015-05-11 LAB — TYPE AND SCREEN
ABO RH Blood Type: A POS
Antibody Screen: NEGATIVE

## 2015-05-11 LAB — TRIGLYCERIDES: Triglycerides: 347 mg/dL — AB

## 2015-05-11 MED ORDER — ENOXAPARIN SODIUM 40 MG/0.4ML IJ SOSY *I*
40.0000 mg | PREFILLED_SYRINGE | INTRAMUSCULAR | Status: DC
Start: 2015-05-11 — End: 2015-05-19
  Administered 2015-05-11 – 2015-05-18 (×7): 40 mg via SUBCUTANEOUS
  Filled 2015-05-11 (×8): qty 0.4

## 2015-05-11 MED ORDER — ESCITALOPRAM OXALATE 10 MG PO TABS *I*
15.0000 mg | ORAL_TABLET | Freq: Every day | ORAL | Status: DC
Start: 2015-05-11 — End: 2015-06-07
  Administered 2015-05-11 – 2015-06-06 (×27): 15 mg via ORAL
  Filled 2015-05-11 (×29): qty 2

## 2015-05-11 MED ORDER — PROMETHAZINE HCL 25 MG PO TABS *I*
25.0000 mg | ORAL_TABLET | Freq: Four times a day (QID) | ORAL | Status: DC | PRN
Start: 2015-05-11 — End: 2015-06-07
  Administered 2015-05-12 – 2015-05-19 (×9): 25 mg via ORAL
  Filled 2015-05-11 (×9): qty 1

## 2015-05-11 MED ORDER — HYDROXYUREA 500 MG PO CAPS *I*
1000.0000 mg | ORAL_CAPSULE | Freq: Two times a day (BID) | ORAL | Status: DC
Start: 2015-05-11 — End: 2015-05-12
  Administered 2015-05-11 – 2015-05-12 (×2): 1000 mg via ORAL
  Filled 2015-05-11 (×4): qty 2

## 2015-05-11 MED ORDER — URSODIOL 300 MG PO CAPS *I*
300.0000 mg | ORAL_CAPSULE | Freq: Two times a day (BID) | ORAL | Status: DC
Start: 2015-05-11 — End: 2015-05-26
  Administered 2015-05-11 – 2015-05-25 (×29): 300 mg via ORAL
  Filled 2015-05-11 (×30): qty 1

## 2015-05-11 MED ORDER — PANTOPRAZOLE SODIUM 40 MG PO TBEC *I*
40.0000 mg | DELAYED_RELEASE_TABLET | Freq: Every morning | ORAL | Status: DC
Start: 2015-05-12 — End: 2015-05-19
  Administered 2015-05-12 – 2015-05-19 (×8): 40 mg via ORAL
  Filled 2015-05-11 (×9): qty 1

## 2015-05-11 MED ORDER — CALCIUM GLUCONATE 4.7 MEQ (1,000 MG) IN 50 ML WRAPPED *I*
4.7000 meq | Freq: Every day | INTRAVENOUS | Status: DC
Start: 2015-05-11 — End: 2015-06-02
  Administered 2015-05-15 – 2015-05-29 (×12): 4.7 meq via INTRAVENOUS
  Filled 2015-05-11 (×14): qty 50

## 2015-05-11 MED ORDER — METHOTREXATE SODIUM (PF) 50 MG/2ML IJ SOLN WRAPPED *I*
30.0000 mg | Freq: Every day | Status: AC
Start: 2015-05-18 — End: 2015-05-18
  Administered 2015-05-18: 30 mg via INTRAVENOUS
  Filled 2015-05-11: qty 1.2

## 2015-05-11 MED ORDER — SODIUM CHLORIDE 0.9 % IV SOLN WRAPPED *I*
130.0000 mg/m2 | INTRAVENOUS | Status: AC
Start: 2015-05-12 — End: 2015-05-12
  Administered 2015-05-12: 244 mg via INTRAVENOUS
  Filled 2015-05-11: qty 40.67

## 2015-05-11 MED ORDER — NICOTINE 14 MG/24HR TD PT24 *I*
1.0000 | MEDICATED_PATCH | Freq: Every day | TRANSDERMAL | Status: DC | PRN
Start: 2015-05-11 — End: 2015-05-15
  Administered 2015-05-13: 1 via TRANSDERMAL
  Filled 2015-05-11 (×4): qty 1

## 2015-05-11 MED ORDER — BMT ATTESTATION *I*
1.0000 | Freq: Once | Status: AC
Start: 2015-05-11 — End: 2015-05-12

## 2015-05-11 MED ORDER — FLUCONAZOLE 200 MG PO TABS *I*
200.0000 mg | ORAL_TABLET | Freq: Every evening | ORAL | Status: DC
Start: 2015-05-17 — End: 2015-05-26
  Administered 2015-05-17 – 2015-05-25 (×9): 200 mg via ORAL
  Filled 2015-05-11 (×9): qty 1

## 2015-05-11 MED ORDER — PROCHLORPERAZINE MALEATE 10 MG PO TABS *I*
10.0000 mg | ORAL_TABLET | Freq: Four times a day (QID) | ORAL | Status: DC | PRN
Start: 2015-05-11 — End: 2015-05-11

## 2015-05-11 MED ORDER — LEUCOVORIN CALCIUM 10 MG/ML IV SOLN *I*
30.0000 mg | Freq: Once | INTRAVENOUS | Status: AC
Start: 2015-05-19 — End: 2015-05-19
  Administered 2015-05-19: 30 mg via INTRAVENOUS
  Filled 2015-05-11: qty 3

## 2015-05-11 MED ORDER — METHOTREXATE SODIUM (PF) 50 MG/2ML IJ SOLN WRAPPED *I*
20.0000 mg | Freq: Every day | Status: AC
Start: 2015-05-20 — End: 2015-05-20
  Administered 2015-05-20: 20 mg via INTRAVENOUS
  Filled 2015-05-11: qty 0.8

## 2015-05-11 MED ORDER — DEXAMETHASONE 4 MG PO TABS *I*
8.0000 mg | ORAL_TABLET | Freq: Every day | ORAL | Status: AC
Start: 2015-05-12 — End: 2015-05-17
  Administered 2015-05-11 – 2015-05-17 (×6): 8 mg via ORAL
  Filled 2015-05-11 (×6): qty 2

## 2015-05-11 MED ORDER — SODIUM CHLORIDE 0.9 % INJ (FLUSH) WRAPPED *I*
10.0000 mL | Status: DC | PRN
Start: 2015-05-11 — End: 2015-06-07

## 2015-05-11 MED ORDER — NICOTINE POLACRILEX 2 MG MT LOZG *I*
2.0000 mg | LOZENGE | OROMUCOSAL | Status: DC | PRN
Start: 2015-05-11 — End: 2015-06-07

## 2015-05-11 MED ORDER — LEUCOVORIN CALCIUM 10 MG/ML IV SOLN *I*
20.0000 mg | Freq: Once | INTRAVENOUS | Status: AC
Start: 2015-05-23 — End: 2015-05-23
  Administered 2015-05-23: 20 mg via INTRAVENOUS
  Filled 2015-05-11: qty 2

## 2015-05-11 MED ORDER — TACROLIMUS 5 MG/ML IV SOLN *I*
1.6590 mg/d | INTRAVENOUS | Status: DC
Start: 2015-05-16 — End: 2015-05-26
  Administered 2015-05-16: 1.8 mg/d via INTRAVENOUS
  Administered 2015-05-17 (×2): 1.98 mg/d via INTRAVENOUS
  Administered 2015-05-17: 1.8 mg/d via INTRAVENOUS
  Administered 2015-05-18 – 2015-05-23 (×12): 2.37 mg/d via INTRAVENOUS
  Administered 2015-05-24: 1.659 mg/d via INTRAVENOUS
  Administered 2015-05-24: 2.37 mg/d via INTRAVENOUS
  Administered 2015-05-25 (×2): 1.659 mg/d via INTRAVENOUS
  Filled 2015-05-11 (×15): qty 0.51

## 2015-05-11 MED ORDER — METHOTREXATE SODIUM (PF) 50 MG/2ML IJ SOLN WRAPPED *I*
20.0000 mg | Freq: Every day | Status: AC
Start: 2015-05-23 — End: 2015-05-23
  Administered 2015-05-23: 20 mg via INTRAVENOUS
  Filled 2015-05-11: qty 0.8

## 2015-05-11 MED ORDER — MAGNESIUM SULFATE 2 G IN NS 50 ML *I*
2000.0000 mg | Freq: Every day | INTRAVENOUS | Status: DC
Start: 2015-05-11 — End: 2015-06-05
  Administered 2015-05-18 – 2015-06-04 (×15): 2000 mg via INTRAVENOUS
  Filled 2015-05-11 (×16): qty 50

## 2015-05-11 MED ORDER — ACYCLOVIR 200 MG PO CAPS *I*
400.0000 mg | ORAL_CAPSULE | Freq: Two times a day (BID) | ORAL | Status: DC
Start: 2015-05-11 — End: 2015-05-26
  Administered 2015-05-11 – 2015-05-25 (×29): 400 mg via ORAL
  Filled 2015-05-11 (×30): qty 2

## 2015-05-11 MED ORDER — SODIUM CHLORIDE 0.9 % IV SOLN WRAPPED *I*
130.0000 mg/m2 | INTRAVENOUS | Status: DC
Start: 2015-05-13 — End: 2015-05-13
  Administered 2015-05-13: 244 mg via INTRAVENOUS
  Filled 2015-05-11 (×2): qty 40.67

## 2015-05-11 MED ORDER — ACETAMINOPHEN 325 MG PO TABS *I*
650.0000 mg | ORAL_TABLET | Freq: Four times a day (QID) | ORAL | Status: DC | PRN
Start: 2015-05-11 — End: 2015-05-27
  Administered 2015-05-13 – 2015-05-26 (×9): 650 mg via ORAL
  Filled 2015-05-11 (×10): qty 2

## 2015-05-11 MED ORDER — ALLOPURINOL 300 MG PO TABS *I*
300.0000 mg | ORAL_TABLET | Freq: Every day | ORAL | Status: DC
Start: 2015-05-11 — End: 2015-05-17
  Administered 2015-05-11 – 2015-05-17 (×7): 300 mg via ORAL
  Filled 2015-05-11 (×9): qty 1

## 2015-05-11 MED ORDER — POTASSIUM CHLORIDE 20 MEQ/50ML IV SOLN *I*
20.0000 meq | Freq: Every day | INTRAVENOUS | Status: DC
Start: 2015-05-11 — End: 2015-06-07
  Administered 2015-05-11 – 2015-06-07 (×13): 20 meq via INTRAVENOUS
  Filled 2015-05-11 (×9): qty 50

## 2015-05-11 MED ORDER — LEUCOVORIN CALCIUM 10 MG/ML IV SOLN *I*
20.0000 mg | Freq: Once | INTRAVENOUS | Status: DC
Start: 2015-05-28 — End: 2015-05-29
  Filled 2015-05-11: qty 2

## 2015-05-11 MED ORDER — GRANISETRON HCL 1 MG PO TABS *I*
2.0000 mg | ORAL_TABLET | Freq: Every day | ORAL | Status: AC
Start: 2015-05-12 — End: 2015-05-17
  Administered 2015-05-11 – 2015-05-17 (×6): 2 mg via ORAL
  Filled 2015-05-11 (×6): qty 2

## 2015-05-11 MED ORDER — LEUCOVORIN CALCIUM 10 MG/ML IV SOLN *I*
20.0000 mg | Freq: Once | INTRAVENOUS | Status: AC
Start: 2015-05-20 — End: 2015-05-20
  Administered 2015-05-20: 20 mg via INTRAVENOUS
  Filled 2015-05-11: qty 2

## 2015-05-11 MED ORDER — SODIUM CHLORIDE 0.9 % IV SOLN WRAPPED *I*
40.0000 mg/m2 | INTRAVENOUS | Status: AC
Start: 2015-05-12 — End: 2015-05-15
  Administered 2015-05-12 – 2015-05-15 (×4): 75 mg via INTRAVENOUS
  Filled 2015-05-11 (×2): qty 3
  Filled 2015-05-11: qty 2
  Filled 2015-05-11: qty 3

## 2015-05-11 MED ORDER — LORAZEPAM 2 MG/ML IJ SOLN *I*
0.5000 mg | Freq: Four times a day (QID) | INTRAMUSCULAR | Status: DC | PRN
Start: 2015-05-11 — End: 2015-05-17

## 2015-05-11 MED ORDER — LORAZEPAM 1 MG PO TABS *I*
1.0000 mg | ORAL_TABLET | Freq: Four times a day (QID) | ORAL | Status: DC | PRN
Start: 2015-05-11 — End: 2015-05-17

## 2015-05-11 MED ORDER — LORAZEPAM 1 MG PO TABS *I*
1.0000 mg | ORAL_TABLET | Freq: Three times a day (TID) | ORAL | Status: AC
Start: 2015-05-11 — End: 2015-05-16
  Administered 2015-05-11 – 2015-05-16 (×15): 1 mg via ORAL
  Filled 2015-05-11 (×15): qty 1

## 2015-05-11 MED ORDER — PROMETHAZINE HCL 25 MG/ML IJ SOLN *I*
12.5000 mg | Freq: Four times a day (QID) | INTRAMUSCULAR | Status: DC | PRN
Start: 2015-05-11 — End: 2015-06-07
  Administered 2015-05-19 – 2015-05-29 (×14): 12.5 mg via INTRAVENOUS
  Filled 2015-05-11 (×14): qty 1

## 2015-05-11 MED ORDER — METHOTREXATE SODIUM (PF) 50 MG/2ML IJ SOLN WRAPPED *I*
20.0000 mg | Freq: Every day | Status: DC
Start: 2015-05-28 — End: 2015-05-29
  Filled 2015-05-11: qty 0.8

## 2015-05-11 NOTE — Progress Notes (Signed)
Continue hydrea 1000 mg BID per [name].  Thalia Party, NP

## 2015-05-11 NOTE — Plan of Care (Addendum)
Safety     Prevent any intentional injury Completed or Resolved          Comfort - BMT-related     Nausea controlled or eliminated Maintaining     Diarrhea controlled or eliminated Maintaining     Without infection Maintaining        Graft Versus Host Disease (GVHD) Bundle     Without rash Maintaining     Stool output within parameters (</= 560ml in 24 h) Maintaining     Total bilirubin </= 2 Maintaining        Mobility     Patient's functional status is maintained or improved Maintaining        Nutrition     Patient's nutritional status is maintained or improved Maintaining        Pain/Comfort     Patient's pain or discomfort is manageable Maintaining        Psychosocial     Demonstrates ability to cope with illness Maintaining        Safety     Patient will remain free of falls Maintaining        Pt admitted, teaching complete, given tour of unit. AVSS with mild anxiety. Double power port accessed. Hydrea order confirmed by [facility] NP in notes, per Dr. Kearney Hard.

## 2015-05-11 NOTE — Telephone Encounter (Signed)
I returned the call to Mardene Celeste in the lab.  She stated that they could no longer add on the Uric Acid and that the patient would need to be redrawn.

## 2015-05-11 NOTE — Progress Notes (Signed)
Error encounter. 

## 2015-05-11 NOTE — Progress Notes (Signed)
Home Health Assessment    Completed by: Kathi Simpers, RN  Phone: [phone]    Referred by: [facility]      Source of Information: Patient and [place] indicators present: Skilled SN, PT and ?OT      Barriers to discharge to be addressed:None noted at this time    Plan: Writer reviewed chart. [facility] will continue to follow for all home care services. Please call [facility] with any questions and/or concerns related to discharge planning.  Kathi Simpers, RN       [facility]

## 2015-05-11 NOTE — H&P (Signed)
Pre-BMT workup    BMT Allogeneic Transplant Checklist ([place])  Reviewed today; this form includes clearance to proceed to BMT from the attending physician in the following areas: recent and pre-BMT dental evaluation, CXR, ECG, PFTs, MUGA/Echo      Physical Exam:  Gen: Well-looking female, NAD  HEENT: EOMI, no LAD, no JVD, OP benign  CV: RRR, no m/r/g  Lungs: CTAB, normal WOB  Abd: soft, NTND, +BS  Extrem: WWP, no edema  Neuro: AOx3, no focal motor/sensory deficits, moving limbs spontaneously, speech normal, gait/coordination not formally tested    Assessment   Carly Higgins is a 55 y.o. 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 By [employer] testing, she has mutations of TET2, ASXL1, CCND2, GATA2, SRSF2.     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     Plan     Today is BMT day -6    Pancytopenia anticipated, chemotherapy-induced  Transfuse 1 unit RBCs for hematocrit <21% and/or transfuse 1 unit platelets for platelet count <11,000 OR transfuse 1 unit platelets for platelet count <20,000 if bleeding or fever.  Bleeding is defined as evidence of external (e.g. melena, gross hematuria, hemoptysis, or hematemesis) or internal (within an organ or cavity).   Fever is defined as a temperature >37.9.  Methotrexate (administered for GVHD prophylaxis) may delay count recovery in the short term    At risk for thromboses due to cancer diagnosis  Enoxaparin; hold for platelet count <30,000 or bleeding    At risk for head injury due to thrombocytopenia  Helmet for safety during periods of thrombocytopenia, per hospital policy    Immunocompromise due to disease and conditioning, compounded by immunosuppression for GVHD prophylaxis  At FIRST temperature spike >37.9   Order cultures  Obtain blood cultures (1 set of bactacs) from each central line port & one set of blood cultures drawn by venipuncture   Begin broad spectrum antibiotics with cefepime  and flagyl (follow febrile neutropenia SOP)    At risk for opportunistic infection: microbial prophylaxis and infection surveillance  At risk for HSV/VZV reactivation due to immunocompromised status  Recipient is HSV sero-positive   Recipient is VZV sero-positive   Viral prophylaxis with acyclovir    At risk for CMV reactivation due to immunocompromised status  Recipient is CMV sero-negative and donor is CMV sero-negative  Recipient and donor are CMV sero-negative, CMV monitoring not required    At risk for candidal/fungal infection due to immunocompromised status  Fungal prophylaxis with fluconazole    At risk for GVHD due to allogeneic stem cell source  Planned GVHD prophylaxis with tacrolimus and methotrexate    Tacro by continuous IV infusion beginning on day-1 via PIV  Target tacrolimus level 8-12    Methotrexate will dose equivalent leucovorin will be adminstered as follows:   15 mg/m2 on day +1 (>24 hrs from stem cell infusion)  10 mg/m2 on days +3, +6, and +11     At risk for renal injury due to medications (e.g. tacrolimus)  Creatinine at admission was 0.7    At risk for fluid imbalance  Encouraged oral intake  IVF if daily oral intake <1563ml by 11pm    At risk for electrolyte imbalance  Replace electrolytes as to the following parameters  Hypokalemia, supplement when serum K <3.6  Hypomagnesemia, supplement when serum Mag <1.5  Hypocalcemia, supplement when corrected calcium <8.9     At risk for malnutrition and/or hypoalbuminemia, anticipated, due to chemotherapy +/-  mucositis  Regular diet  Albumin level on admission was 4.3  Nutritional consult requested for nutrition assessment at admission    At risk for nausea +/- emesis, anticipated, due to chemotherapy  Emetic prophylaxis with granisitron and dexamethasone until 48 hours after conditioning  Also has rescue anti-emetics (prn promethazine, lorazepam)    At risk for diarrhea, anticipated, due to chemotherapy, medications  If develops diarrhea and  meets criteria for watery stool will send for C difficile testing  If C diff test negative will provide imodium (loperamide) PRN  If abdominal cramping consider simethicone and or PRN ativan (lorazepam)    At risk for stress ulcer due to chemotherapy  GI prophylaxis with pantoprazole    At risk for VOD/SOS due to myeloablative conditioning regimen  Moderate risk of VOD/SOD with ablative BU/Flu  Ursodiol and enoxaparin (enoxaparin also used for DVT prophylaxis) prophylaxis in the myoloablative setting (particularly busulfan), concurrent transaminitis, myelofibrosis, other liver pathology and/or if the patient has other risk factors for VOD/SOS (e.g. certain pre-BMT treatments, second transplant)    At risk for mucositis, anticipated, due to conditioning regimen  Moderate risk of mucositis with ablative BU/Flu   Methotrexate may worsen mucositis in the short term  Cont good oral care, normal saline rinses  Provide BMX and pain medications as needed    At risk for deconditioning due to intensity of treatment and potential decrease in physical activity   Encouraged physical activity and ambulation during inpatient stay to decrease the risks of pneumonia, decubuti, DVT and to lessen the degree of deconditioning  Consult physical therapy if needed    Psychosocial:   Emotional support offered   Social Worker notified of admission    Safety  In addition to other patient/family education and per unit standard of care, the admitting RN discussed safety information.   This included, but was not limited to, handwashing, N95 mask use, food safety, off unit restriction, visitation, and use of call bell for assistance, fall prevention, and helmet use.   Reinforce as needed    Patient/Family Education:  Discussed patient specific transplant-related information with patient today.  This included, but was not limited to, the conditioning regimen, common side effects of conditioning (alopecia, nausea/emesis, mucositis, potential organ  toxicity), risk of infection, risk of bleeding, risk of GVHD, and the importance of GVHD prophylaxis to minimize this risk.  The patient indicated an understanding  Reinforce as needed    Smoking Cessation  Offered nicotine replacement & [place] Smoking Cessation Program; patient not needing any yet    Dispo planning  Anticipate discharge following engraftment and clinical stability  BMT consent signed  BMT attending [name]  Referring oncologist (please list referring & PCP, this step was requested at a recent patient care meeting, thanks) [name]   Primary care physician [name]  RN coordinator Carly Higgins  Lives in [place]

## 2015-05-12 ENCOUNTER — Encounter: Payer: Self-pay | Admitting: Gastroenterology

## 2015-05-12 LAB — CBC AND DIFFERENTIAL
Baso # K/uL: 0 10*3/uL (ref 0.0–0.1)
Basophil %: 0 %
Eos # K/uL: 0 10*3/uL (ref 0.0–0.4)
Eosinophil %: 0 %
Hematocrit: 23 % — ABNORMAL LOW (ref 34–45)
Hemoglobin: 7.2 g/dL — ABNORMAL LOW (ref 11.2–15.7)
Lymph # K/uL: 4.5 10*3/uL — ABNORMAL HIGH (ref 1.2–3.7)
Lymphocyte %: 7.6 %
MCH: 29 pg/cell (ref 26–32)
MCHC: 31 g/dL — ABNORMAL LOW (ref 32–36)
MCV: 93 fL (ref 79–95)
Mono # K/uL: 1.4 10*3/uL — ABNORMAL HIGH (ref 0.2–0.9)
Monocyte %: 2.5 %
Neut # K/uL: 38.7 10*3/uL — ABNORMAL HIGH (ref 1.6–6.1)
Nucl RBC # K/uL: 0.4 10*3/uL — ABNORMAL HIGH (ref 0.0–0.0)
Nucl RBC %: 0.6 /100 WBC — ABNORMAL HIGH (ref 0.0–0.2)
Platelets: 62 10*3/uL — ABNORMAL LOW (ref 160–370)
RBC: 2.5 MIL/uL — ABNORMAL LOW (ref 3.9–5.2)
RDW: 22.1 % — ABNORMAL HIGH (ref 11.7–14.4)
Seg Neut %: 49.3 %
WBC: 56.1 10*3/uL — ABNORMAL HIGH (ref 4.0–10.0)

## 2015-05-12 LAB — URINALYSIS WITH REFLEX TO MICROSCOPIC
Blood,UA: NEGATIVE
Ketones, UA: NEGATIVE
Leuk Esterase,UA: NEGATIVE
Nitrite,UA: NEGATIVE
Protein,UA: NEGATIVE mg/dL
Specific Gravity,UA: 1.019 (ref 1.002–1.030)
pH,UA: 6 (ref 5.0–8.0)

## 2015-05-12 LAB — DIFF MANUAL
Bands %: 20 % (ref 0–10)
Blasts %: 2 % — ABNORMAL HIGH (ref 0–0)
Diff Based On: 118 CELLS
Metamyelocyte %: 17 % — ABNORMAL HIGH (ref 0–1)
Myelocyte %: 2 % — ABNORMAL HIGH (ref 0–0)

## 2015-05-12 LAB — BASIC METABOLIC PANEL
Anion Gap: 16 (ref 7–16)
CO2: 21 mmol/L (ref 20–28)
Calcium: 9.1 mg/dL (ref 8.6–10.2)
Chloride: 100 mmol/L (ref 96–108)
Creatinine: 0.82 mg/dL (ref 0.51–0.95)
GFR,Black: 93 *
GFR,Caucasian: 81 *
Glucose: 165 mg/dL — ABNORMAL HIGH (ref 60–99)
Lab: 17 mg/dL (ref 6–20)
Potassium: 4.8 mmol/L (ref 3.3–5.1)
Sodium: 137 mmol/L (ref 133–145)

## 2015-05-12 LAB — MAGNESIUM: Magnesium: 1.8 mEq/L (ref 1.3–2.1)

## 2015-05-12 LAB — URIC ACID: Urate: 4.4 mg/dL (ref 2.7–6.8)

## 2015-05-12 MED ORDER — HYDROXYUREA 500 MG PO CAPS *I*
500.0000 mg | ORAL_CAPSULE | Freq: Two times a day (BID) | ORAL | Status: DC
Start: 2015-05-12 — End: 2015-05-13
  Administered 2015-05-12 – 2015-05-13 (×2): 500 mg via ORAL
  Filled 2015-05-12 (×4): qty 1

## 2015-05-12 NOTE — Consults (Signed)
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 By [employer] testing, she has mutations of TET2, ASXL1, CCND2, GATA2, SRSF2.  Carly Higgins is now admitted for preparation for an unrelated donor transplant PBSCT with Day 0 = 05/17/15 after conditioning with busulfan/fludarabine.    Past Medical History:   Diagnosis Date    GERD (gastroesophageal reflux disease)     Shortness of breath      Past Surgical History:   Procedure Laterality Date    ROTATOR CUFF REPAIR Right         Pertinent Social Hx: lives locally, divorced.    Pertinent Meds: reviewed, dexamethasone, hydroxyurea, fludarabine, busulfan    Pertinent Labs: reviewed, LD 1069; otherwise electrolytes, glucose, creatinine, LFTs all within normal or acceptable levels for current medical status    Reviewed I/O's    Access: DL IVAD    Nutrition Hx: some days during chemo when she didn't feel like eating much, but says "not too many".    Food allergies: NKFA    Current diet: regular  Supplements: none    Nutrition Focused Physical Exam:  Edema: none  Abdomen: WDL  Oral score:  8  Skin: WDL  Body fat stores: adequate  Lean body mass stores: adequate    Anthropometrics:  Height: 169.1 cm (5' 6.58") (with Hervey Ard RN)    Current Weight: 113.4 kg (250 lb);   UBW: 113-118 kg  Ideal Body Weight: 60.2 kg + 10%  Body mass index is 39.66 kg/(m^2). using current weight;  Obese grade 3    Weight Hx:   Stable in the 113-118 kg range over the past several years    Estimated Nutrient Needs: (Based on 60.2 kg)    1800-2100 kcal/day (30-35 kcal/kg)   90-120 g protein/day (1.5-2.0 g/kg)    2100 mL fluid/day (35 mL/kg)      Nutrition Assessment and Diagnosis:   Potential for protein-energy malnutrition related to cancer as evidenced by plan for allo PBSCT.    Brief education done with  patient on calorie and protein goals, protein sources and amounts, and discussed link to Eating Hints booklet.  Written information provided, including my contact information.    Malnutrition Status:   Does not currently fit criteria for either moderate or severe protein calorie malnutrition  Nutrition Intervention:   1. continue regular diet  2. Add supplements when needed    Nutrition Monitoring/Evaluation:   1. Will monitor diet tolerance and intake, nutrition-related labs, weight trend, BM pattern.    2. Nutrition to follow up per high nutrition risk protocol.    Rose Fillers, [place], [place], [place] pager [phone]

## 2015-05-12 NOTE — Interdisciplinary Rounds (Signed)
Interdisciplinary Rounds Note    Date: 05/12/2015   Time: 9:38 AM   Attendance:  Nurse Practitioner, Pharmacist, Physician, Physician Assistant and Registered Nurse    Admit Date/Time:  05/11/2015  2:02 PM    Principal Problem: <principal problem not specified>  Problem List:   Patient Active Problem List    Diagnosis Date Noted    Myeloproliferative disease 05/11/2015    MPN (myeloproliferative neoplasm) 01/19/2014    Leukocytosis 09/22/2013       The patient's problem list and interdisciplinary care plan was reviewed.    Discharge Planning                 *Does patient currently have home care services?: No     *Current External Services: None                      Plan  05/12/2015 Day -5 MUD for Myeloproliferative Neoplasm & Leukocytosis , admitted yesterday, WBC 56.1, ANC 38.7, hct 23, plts 62K, fludarabine and busulfan given, levels checked, continue to monitor.  Anticipated Discharge Date:     Discharge Disposition: Home with Services

## 2015-05-12 NOTE — Progress Notes (Signed)
Blood & Marrow Transplant Program APP Progress Note    CC   Carly Higgins is a 55 y.o. 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 By [employer] testing, she has mutations of TET2, ASXL1, CCND2, GATA2, SRSF2.     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     Length of stay  LOS: 1 day     Interval History   No acute events overnight. Tolerated 1st dose of Flu/Bu well last night. No nausea, some smoking urge this AM but has not needed nicotine replacement as of yet.     Review of Systems   A comprehensive review of 13 systems was performed; pertinent positives checked below:  _0  fevers  _1  fast pulse _2  urinary frequency   _3  chills _4  chest pain _5  dysuria    _6  dry eyes _7  nausea/emesis  _8  rash   _9  ear pain _10  abdominal cramping _11  skin changes   _12  sores in mouth _13  abdominal discomfort _14  pain   _15  sore throat _16  loose stool, non-formed _17  none of the above   _18  cough  _19  diarrhea, watery    _20  sputum production  _21  weight loss    _22  shortness of breath _23  insufficient oral intake         Vital Signs and I&O   Temp:  [36 C (96.8 F)-36.4 C (97.5 F)] 36.2 C (97.2 F)  Heart Rate:  [77-100] 79  Resp:  [16-18] 18  BP: (108-134)/(52-82) 134/74    Intake/Output Summary (Last 24 hours) at 05/12/15 0754  Last data filed at 05/12/15 3086   Gross per 24 hour   Intake             1717 ml   Output              800 ml   Net              917 ml        Physical Exam     Head:  Normocephalic, atraumatic   Eyes:  Conjunctiva/corneas clear, no drainage   Throat: Lips, mucosa, & tongue normal; no pharyngeal exudate   Lungs:   Clear to auscultation bilaterally, respirations unlabored   Heart:  Regular rate and rhythm, S1, S2 normal, no murmur, rub or gallop   Abdomen:   Soft, non-tender, bowel sounds active all four quadrants,  no masses, no organomegaly   Extremities: No edema   Pulses: 2+ and symmetric   Skin: No rashes or lesions   Neurologic:  A&O X 3, CMS intact   Central venous access:      Assessment & Plan     Today is BMT day -5    Pancytopenia anticipated, chemotherapy-induced  Transfuse 1 unit RBCs for hematocrit <21% and/or transfuse 1 unit platelets for platelet count <11,000 OR transfuse 1 unit platelets for platelet count <20,000 if bleeding or fever.  Bleeding is defined as evidence of external (e.g. melena, gross hematuria, hemoptysis, or hematemesis) or internal (within an organ or cavity).   Fever is defined as a temperature >37.9.  Methotrexate (administered for GVHD prophylaxis) may delay count recovery in the short term    At risk for thromboses due to cancer diagnosis  Enoxaparin; hold for platelet count <30,000 or bleeding    At risk for head injury due to thrombocytopenia  Helmet for safety during periods of thrombocytopenia, per hospital policy    Immunocompromise due to disease and conditioning,  compounded by immunosuppression for GVHD prophylaxis  At FIRST temperature spike >37.9   Order cultures  Obtain blood cultures (1 set of bactacs) from each central line port & one set of blood cultures drawn by venipuncture   Begin broad spectrum antibiotics with cefepime and flagyl (follow febrile neutropenia SOP)    At risk for opportunistic infection: microbial prophylaxis and infection surveillance  At risk for HSV/VZV reactivation due to immunocompromised status  Recipient is HSV sero-positive   Recipient is VZV sero-positive   Viral prophylaxis with acyclovir    At risk for CMV reactivation due to immunocompromised status  Recipient is CMV sero-negative and donor is CMV sero-negative  Recipient and donor are CMV sero-negative, CMV monitoring not required    At risk for candidal/fungal infection due to immunocompromised status  Fungal prophylaxis with fluconazole    At risk for GVHD due to allogeneic stem cell source  Planned GVHD prophylaxis with tacrolimus and methotrexate    Tacro by continuous IV infusion beginning on  day-1 via PIV  Target tacrolimus level 8-12    Methotrexate will dose equivalent leucovorin will be adminstered as follows:   15 mg/m2 on day +1 (>24 hrs from stem cell infusion)  10 mg/m2 on days +3, +6, and +11     At risk for renal injury due to medications (e.g. tacrolimus)  Creatinine at admission was 0.7    At risk for fluid imbalance  Encouraged oral intake  IVF if daily oral intake <1525m by 11pm    At risk for electrolyte imbalance  Replace electrolytes as to the following parameters  Hypokalemia, supplement when serum K <3.6  Hypomagnesemia, supplement when serum Mag <1.5  Hypocalcemia, supplement when corrected calcium <8.9     At risk for malnutrition and/or hypoalbuminemia, anticipated, due to chemotherapy +/- mucositis  Regular diet  Albumin level on admission was 4.3  Nutritional consult requested for nutrition assessment at admission    At risk for nausea +/- emesis, anticipated, due to chemotherapy  Emetic prophylaxis with granisitron and dexamethasone until 48 hours after conditioning  Also has rescue anti-emetics (prn promethazine, lorazepam)    At risk for diarrhea, anticipated, due to chemotherapy, medications  If develops diarrhea and meets criteria for watery stool will send for C difficile testing  If C diff test negative will provide imodium (loperamide) PRN  If abdominal cramping consider simethicone and or PRN ativan (lorazepam)    At risk for stress ulcer due to chemotherapy  GI prophylaxis with pantoprazole    At risk for VOD/SOS due to myeloablative conditioning regimen  Moderate risk of VOD/SOD with ablative BU/Flu  Ursodiol and enoxaparin (enoxaparin also used for DVT prophylaxis) prophylaxis in the myoloablative setting (particularly busulfan), concurrent transaminitis, myelofibrosis, other liver pathology and/or if the patient has other risk factors for VOD/SOS (e.g. certain pre-BMT treatments, second transplant)    At risk for mucositis, anticipated, due to conditioning  regimen  Moderate risk of mucositis with ablative BU/Flu   Methotrexate may worsen mucositis in the short term  Cont good oral care, normal saline rinses  Provide BMX and pain medications as needed    At risk for deconditioning due to intensity of treatment and potential decrease in physical activity   Encouraged physical activity and ambulation during inpatient stay to decrease the risks of pneumonia, decubuti, DVT and to lessen the degree of deconditioning  Consult physical therapy if needed    Psychosocial:   Emotional support offered   Social Worker notified of admission  Safety  In addition to other patient/family education and per unit standard of care, the admitting RN discussed safety information.   This included, but was not limited to, handwashing, N95 mask use, food safety, off unit restriction, visitation, and use of call bell for assistance, fall prevention, and helmet use.   Reinforce as needed    Patient/Family Education:  Discussed patient specific transplant-related information with patient today.  This included, but was not limited to, the conditioning regimen, common side effects of conditioning (alopecia, nausea/emesis, mucositis, potential organ toxicity), risk of infection, risk of bleeding, risk of GVHD, and the importance of GVHD prophylaxis to minimize this risk.  The patient indicated an understanding  Reinforce as needed    Smoking Cessation  Offered nicotine replacement & [place] Smoking Cessation Program; patient not needing any yet    Dispo planning  Anticipate discharge following engraftment and clinical stability  BMT consent signed  BMT attending [name]  Referring oncologist (please list referring & PCP, this step was requested at a recent patient care meeting, thanks) [name]   Primary care physician [name]  RN coordinator Anne/Sharon  Lives in [place], [place]    Lab Data      Lab results: 05/12/15  4970 05/11/15  1649 05/09/15  0758   04/28/15  1350   WBC 56.1* 43.7* 41.0*  < > 18.3*   Hemoglobin 7.2* 7.1* 8.0*  < > 8.2*   Hematocrit 23* 22* 25*  < > 26*   RBC 2.5* 2.4* 2.8*  < > 2.9*   Platelets 62* 59* 75*  < > 28*   Neut # K/uL 38.7* 27.1* 24.6*  < > 12.3*   Lymph # K/uL 4.5* 2.6 1.6  < > 1.8   Mono # K/uL 1.4* 0.4 0.0*  < > 0.0*   Eos # K/uL 0.0 0.0 0.0  < > 0.0   Baso # K/uL 0.0 0.0 0.0  < > 0.0   Seg Neut % 49.3 53.5 47.8  < > 64.5   Lymphocyte % 7.6 6.1 3.5  < > 10.4   Monocyte % 2.5 0.9 0.0  < > 0.0   Eosinophil % 0.0 0.0 0.0  < > 0.0   Basophil % 0.0 0.0 0.0  < > 0.0   Bands % 20* 8 12*  < > 2   Myelocyte % 2* 12* 20*  < > 14*   Metamyelocyte % 17* 12* 5*  < > 6*   Promyelocyte %  --  4* 6*  --  2*   Blasts % 2* 3* 5*  < > 2*   < > = values in this interval not displayed.            Lab results: 05/12/15  0527 05/11/15  1649 05/09/15  0758  03/23/15  0721   Sodium 137 140  --   --  142   Potassium 4.8 4.0  --   --  4.5   Chloride 100 100  --   --  103   CO2 21 23  --   --  22   UN 17 14  --   --  23*   Creatinine 0.82 0.98* 0.70  < > 0.88   GFR,Caucasian 81 65  --   --  74   GFR,Black 93 75  --   --  86   Glucose 165* 96  --   --  128*  Calcium 9.1 8.9  --   --  8.9   < > = values in this interval not displayed.        Lab results: 05/12/15  0527   Magnesium 1.8       Lab Results   Component Value Date    ALT 17 05/11/2015    AST 36 (H) 05/11/2015     Lab Results   Component Value Date    ALK 73 05/11/2015     Lab Results   Component Value Date    TB 0.4 05/11/2015     LD   Date Value Ref Range Status   05/11/2015 1069 (H) 118 - 225 U/L Final     LD, PL   Date Value Ref Range Status   05/09/2015 1146 (H) 118 - 225 U/L Final     LDL Calculated   Date Value Ref Range Status   08/27/2013 113 mg/dL Final     Comment:     REFERENCE RANGE:  < 100 Optimal                  100-129 Near or above optimal                  130-159 Borderline High                  160-189 High                    > 189 Very High

## 2015-05-12 NOTE — Progress Notes (Signed)
In-patient Visit  [facility]  [phone]    Patient name: Carly Higgins     There were no vitals filed for this visit.    The Patient was provided with the following items:  Orthosis  Side: N/A  Size:: Large  Model:: Hard Helmet  Manufacturer: [employer]  Part Number: [id number]  Warranty:: 90 Days  Quantity: 1  Status: Delivered    Functional Goals: Provide cushion    ROM settings: N/A    Expected Frequency / Duration of Use:   Per physician orders    Modifications made: No modifications were required at this time    Complexity:   Fitting was routine, requiring little to no adjustments    Yes, Device was inspected for safety/security and found to be functioning properly    Ordered/Delivered: Device Delivered    The patient given verbal instructions.  The following instructions were reviewed: Purpose of device, Frequency / Duration of use and How to report potential failure / malfunctions.    [place]  [phone]    Patient Instructions for Protective Helmet    Purpose of device   You have been provided with a protective helmet as prescribed by your physician.  A protective helmet is indicated post-operatively or when a patient is at high risk for falling.    Wear Instructions  Protective helmet is indicated for wear when sitting in a chair or out of bed.    Donning Your Protective Helmet   If helmet is being using post-surgically, helmet should be donned centered overhead but at angle to prevent rubbing at incision site.     Front helmet should sit at eyebrow level. Helmet should not cover eyes.  Pads are commonly placed inside of helmet to insure appropriate fit.   Chin strap should be comfortably fastened.      Cleaning and Care    It is important to regularly clean the inside of your helmet.  Your helmet can be cleaned with soap/ water or rubbing alcohol pads    Frequency / Duration of use   Your physician will determine the overall length  of time you will need to use your helmet    Potential Risks / Benefits   Post-surgical volume fluctuation is common and may cause your helmet to become ill fitting  In the event that your helmet  becomes loose it is important to have fit checked.      How to report potential failure/malfunction   If you have any concerns or questions regarding the fit of your helmet please contact [facility] at [phone]

## 2015-05-12 NOTE — Plan of Care (Signed)
Comfort - BMT-related     Nausea controlled or eliminated Maintaining     Diarrhea controlled or eliminated Maintaining     Without infection Maintaining        Graft Versus Host Disease (GVHD) Bundle     Without rash Maintaining     Stool output within parameters (</= 538ml in 24 h) Maintaining     Total bilirubin </= 2 Maintaining        Mobility     Patient's functional status is maintained or improved Maintaining        Nutrition     Patient's nutritional status is maintained or improved Maintaining        Pain/Comfort     Patient's pain or discomfort is manageable Maintaining        Psychosocial     Demonstrates ability to cope with illness Maintaining        Safety     Patient will remain free of falls Maintaining          Carly Higgins, 55 y.o. female is here for chemotherapy infusion of: fludarabine 75 mg over one hour and busulfan 244 mg over three hours  Schema Day:- 4   Dose verified by 2 Rn's [x] YES [] NO  Pt identification verified by 2 Rn's  [x] YES [] NO  Treatment plan verified by 2 Rn's by provider note: [x] YES [] NO  Consent present in chart: [x] YES [] NO  Premeds given: [x] YES [] NO  IV device: [] TCVC[x] IVAD[] PICC[] PIV  IV therapy site and patency before infusion: subclavian right, condition patent and no redness  IV Fluid given: [] YES [x] NO  Blood return present before infusion: [x] YES [] NO  Blood return present after infusion: [x] YES [] NO  IV therapy site and patency after infusion: subclavian right, condition patent and no redness  Patient tolerated transfusion well    Bleeding from IVAD site, used occlusive gauze dressing and held evening lovenox dose.

## 2015-05-12 NOTE — Progress Notes (Signed)
Utilization Management    Level of Care Inpatient as of the date 05/11/15.    Jode Lippe, RN     Pager: [phone]

## 2015-05-12 NOTE — Progress Notes (Signed)
Carly Higgins, 55 y.o. female is here for chemotherapy infusion of: fludarabine    Schema Day:- 5  Dose verified by 2 Rn's [x] YES [] NO  Pt identification verified by 2 Rn's  [x] YES [] NO  Treatment plan verified by 2 Rn's by provider note: [x] YES [] NO  Consent present in chart: [x] YES [] NO  Premeds given: [x] YES [] NO  IV device: [] TCVC[x] IVAD[] PICC[] PIV    IV therapy site and patency before infusion:   IV Fluid given: [] YES [x] NO  Blood return present before infusion: [x] YES [] NO    Dose given: 40 mg/ m2. 1.88 m2. Admit dose 75 mg     Blood return present after infusion: [x] YES [] NO  IV therapy site and patency after infusion:   Patient tolerated infusion well    Patient and [x] YES [] NO family educated related to   [x] YES [] NOdrugs received   [x] YES [] NO follow up care.

## 2015-05-12 NOTE — Progress Notes (Signed)
Carly Higgins, 55 y.o. female is here for chemotherapy infusion of: busulfan    Schema Day: - 5  Dose verified by 2 Rn's [x] YES [] NO  Pt identification verified by 2 Rn's  [x] YES [] NO  Treatment plan verified by 2 Rn's by provider note: [x] YES [] NO  Consent present in chart: [x] YES [] NO  Premeds given: [x] YES [] NO  IV device: [] TCVC[x] IVAD[] PICC[] PIV    IV therapy site and patency before infusion:   IV Fluid given: [] YES [x] NO  Blood return present before infusion: [x] YES [] NO    Dose given: 130 mg/ m2. 1.88 m2. Admit dose 244 mg    Blood return present after infusion: [x] YES [] NO  IV therapy site and patency after infusion:   Patient tolerated infusion well    Patient and [x] YES [] NO family educated related to   [x] YES [] NOdrugs received   [x] YES [] NO follow up care.     Infusion began at 0206. Busulfan levels to be drawn at 0521, 0706, 0906, and 1106.

## 2015-05-13 LAB — COMPREHENSIVE METABOLIC PANEL
ALT: 12 U/L (ref 0–35)
AST: 34 U/L (ref 0–35)
Albumin: 3.8 g/dL (ref 3.5–5.2)
Alk Phos: 60 U/L (ref 35–105)
Anion Gap: 13 (ref 7–16)
Bilirubin,Total: 0.3 mg/dL (ref 0.0–1.2)
CO2: 25 mmol/L (ref 20–28)
Calcium: 8.9 mg/dL (ref 8.6–10.2)
Chloride: 101 mmol/L (ref 96–108)
Creatinine: 0.86 mg/dL (ref 0.51–0.95)
GFR,Black: 88 *
GFR,Caucasian: 76 *
Glucose: 134 mg/dL — ABNORMAL HIGH (ref 60–99)
Lab: 15 mg/dL (ref 6–20)
Potassium: 4.4 mmol/L (ref 3.3–5.1)
Sodium: 139 mmol/L (ref 133–145)
Total Protein: 6.3 g/dL (ref 6.3–7.7)

## 2015-05-13 LAB — CBC AND DIFFERENTIAL
Baso # K/uL: 0 10*3/uL (ref 0.0–0.1)
Basophil %: 0 %
Eos # K/uL: 0.4 10*3/uL (ref 0.0–0.4)
Eosinophil %: 0.9 %
Hematocrit: 21 % — ABNORMAL LOW (ref 34–45)
Hemoglobin: 6.5 g/dL — ABNORMAL LOW (ref 11.2–15.7)
Lymph # K/uL: 2.2 10*3/uL (ref 1.2–3.7)
Lymphocyte %: 5.3 %
MCH: 29 pg/cell (ref 26–32)
MCHC: 32 g/dL (ref 32–36)
MCV: 92 fL (ref 79–95)
Mono # K/uL: 0 10*3/uL — ABNORMAL LOW (ref 0.2–0.9)
Monocyte %: 0 %
Neut # K/uL: 32.8 10*3/uL — ABNORMAL HIGH (ref 1.6–6.1)
Nucl RBC # K/uL: 0.3 10*3/uL — ABNORMAL HIGH (ref 0.0–0.0)
Nucl RBC %: 0.7 /100 WBC — ABNORMAL HIGH (ref 0.0–0.2)
Platelets: 54 10*3/uL — ABNORMAL LOW (ref 160–370)
RBC: 2.2 MIL/uL — ABNORMAL LOW (ref 3.9–5.2)
RDW: 22.5 % — ABNORMAL HIGH (ref 11.7–14.4)
Seg Neut %: 56 %
WBC: 44.3 10*3/uL — ABNORMAL HIGH (ref 4.0–10.0)

## 2015-05-13 LAB — DIFF MANUAL
Bands %: 18 % (ref 0–10)
Blasts %: 2 % — ABNORMAL HIGH (ref 0–0)
Diff Based On: 114 CELLS
Metamyelocyte %: 13 % — ABNORMAL HIGH (ref 0–1)
Myelocyte %: 4 % — ABNORMAL HIGH (ref 0–0)
Promyelocyte %: 1 % — ABNORMAL HIGH (ref 0–0)

## 2015-05-13 LAB — MAGNESIUM: Magnesium: 1.7 mEq/L (ref 1.3–2.1)

## 2015-05-13 LAB — PHOSPHORUS: Phosphorus: 4.2 mg/dL (ref 2.7–4.5)

## 2015-05-13 LAB — LACTATE DEHYDROGENASE: LD: 1079 U/L — ABNORMAL HIGH (ref 118–225)

## 2015-05-13 MED ORDER — ALBUTEROL SULFATE HFA 108 (90 BASE) MCG/ACT IN AERS *I*
1.0000 | INHALATION_SPRAY | Freq: Four times a day (QID) | RESPIRATORY_TRACT | Status: DC | PRN
Start: 2015-05-13 — End: 2015-05-29
  Administered 2015-05-13 – 2015-05-29 (×3): 1 via RESPIRATORY_TRACT
  Filled 2015-05-13: qty 8

## 2015-05-13 MED ORDER — POLYETHYLENE GLYCOL 3350 PO 4.25G *I*
4.2500 g | Freq: Once | ORAL | Status: DC
Start: 2015-05-13 — End: 2015-05-13
  Filled 2015-05-13: qty 4.25

## 2015-05-13 MED ORDER — SODIUM CHLORIDE 0.9 % IV SOLN WRAPPED *I*
285.0000 mg | Status: AC
Start: 2015-05-14 — End: 2015-05-15
  Administered 2015-05-14 – 2015-05-15 (×2): 285 mg via INTRAVENOUS
  Filled 2015-05-13 (×2): qty 47.5

## 2015-05-13 MED ORDER — DOCUSATE SODIUM 100 MG PO CAPS *I*
100.0000 mg | ORAL_CAPSULE | Freq: Two times a day (BID) | ORAL | Status: DC
Start: 2015-05-13 — End: 2015-05-13

## 2015-05-13 MED ORDER — DOCUSATE SODIUM 100 MG PO CAPS *I*
100.0000 mg | ORAL_CAPSULE | Freq: Two times a day (BID) | ORAL | Status: DC | PRN
Start: 2015-05-13 — End: 2015-05-22
  Administered 2015-05-13 – 2015-05-14 (×3): 100 mg via ORAL
  Filled 2015-05-13 (×3): qty 1

## 2015-05-13 MED ORDER — POLYETHYLENE GLYCOL 3350 PO PACK 17 GM *I*
17.0000 g | PACK | Freq: Once | ORAL | Status: AC
Start: 2015-05-13 — End: 2015-05-13
  Administered 2015-05-13: 17 g via ORAL
  Filled 2015-05-13: qty 17

## 2015-05-13 MED ORDER — IPRATROPIUM-ALBUTEROL 0.5-2.5 MG/3ML IN SOLN *I*
3.0000 mL | Freq: Four times a day (QID) | RESPIRATORY_TRACT | Status: DC | PRN
Start: 2015-05-13 — End: 2015-05-13
  Filled 2015-05-13 (×2): qty 3

## 2015-05-13 MED ORDER — IPRATROPIUM-ALBUTEROL 0.5-2.5 MG/3ML IN SOLN *I*
3.0000 mL | Freq: Four times a day (QID) | RESPIRATORY_TRACT | Status: DC | PRN
Start: 2015-05-13 — End: 2015-05-29
  Administered 2015-05-28 – 2015-05-29 (×3): 3 mL via RESPIRATORY_TRACT
  Filled 2015-05-13 (×19): qty 3

## 2015-05-13 NOTE — Progress Notes (Signed)
BMT SOCIAL WORK NOTE    [name] presents for admission to the BMT unit for an unrelated allogeneic hematopoietic stem cell transplant for treatment of myeloproliferative neoplasm. She is followed by [name] in the BMT clinic.  She is a 55 year old Caucasian female who is single, without children. She has 1 sister, and 1 brother. Both of her parents are alive.    She lives with her sister in [place], [place]. There are 5 steps to enter the home. The bedroom and bathroom are on the second floor, where there are 13 steps to the second floor. She will be staying with her parents for several weeks after transplant. The patient lives within a commutable distance from the hospital, and will not require access local lodging. She does not have any concerns about her home situation, or about post transplant supports.    She is adjusting well to this admission.  There are no known concerns or needs at this time.  Social work will continue to follow providing psychosocial support, Journalist, newspaper, and discharge planning.  Should any needs arise please notify social work so that I may provide assistance at that time.       Carly Higgins, [place]

## 2015-05-13 NOTE — Progress Notes (Signed)
Report Given To  Jewel Baize, RN      Descriptive Sentence / Reason for Admission     05/13/15 Day -4 MUD for Myeloproliferative Neoplasm        Active Issues / Relevant Events     Pt is a smoker, quit day of admission, has prn nicotine patch/lozenge    Tolerating busulfan/fludarabine    Bleeding at IVAD site, dressed with occlusive gauze dressing.           To Do List    Monitor for nausea and need for nicotine replacement.         Anticipatory Guidance / Discharge Planning    TBD

## 2015-05-13 NOTE — Progress Notes (Signed)
Busulfan Pharmacokinetics    AUC Target: 4750    Current Dose: 244 mg    Calculated AUC with the first dose of busulfan: 4056    Received a Call from Dr. Wandra Feinstein at [facility]    Recommended new dose for remaining 2 doses: 328 mg to obtain grand average AUC of 4750/dose. This is a large dose increase that would result in single dose AUC of ~5500. In discussion with [name], we will instead target 4750 with the last two doses instead of a grand average of 4750/dose. The new dose for the last two doses will be 285 mg.     Josefina Do, PharmD, BCOP

## 2015-05-13 NOTE — Progress Notes (Signed)
Blood & Marrow Transplant Program APP Progress Note    CC   Carly Higgins is a 55 y.o. 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 By [employer] testing, she has mutations of TET2, ASXL1, CCND2, GATA2, SRSF2.     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     Length of stay  LOS: 2 days     Interval History   No acute events overnight. Tolerated Flu/Bu well last night. No nausea, a bit of wheezing/cough, not needing nebs or nicotine replacement yet.     Review of Systems   A comprehensive review of 13 systems was performed; pertinent positives checked below:  []  fevers  []  fast pulse []  urinary frequency   []  chills []  chest pain []  dysuria    []  dry eyes []  nausea/emesis  []  rash   []  ear pain []  abdominal cramping []  skin changes   []  sores in mouth []  abdominal discomfort []  pain   []  sore throat []  loose stool, non-formed []  none of the above   [x]  cough  []  diarrhea, watery    []  sputum production  []  weight loss    []  shortness of breath []  insufficient oral intake         Vital Signs and I&O   Temp:  [36 C (96.8 F)-37 C (98.6 F)] 37 C (98.6 F)  Heart Rate:  [80-91] 80  Resp:  [18] 18  BP: (135-151)/(69-90) 136/83    Intake/Output Summary (Last 24 hours) at 05/13/15 0806  Last data filed at 05/13/15 1610   Gross per 24 hour   Intake             2312 ml   Output             2250 ml   Net               62 ml        Physical Exam     Head:  Normocephalic, atraumatic   Eyes:  Conjunctiva/corneas clear, no drainage   Throat: Lips, mucosa, & tongue normal; no pharyngeal exudate   Lungs:   Mild bilateral end-expiratory wheezes in upper fields   Heart:  Regular rate and rhythm, S1, S2 normal, no murmur, rub or gallop   Abdomen:   Soft, non-tender, bowel sounds active all four quadrants,  no masses, no organomegaly   Extremities: No edema   Pulses: 2+ and symmetric   Skin: No rashes or lesions   Neurologic: A&O X 3, CMS intact    Central venous access:      Assessment & Plan     Today is BMT day -4    Wheezing  Albuterol inhaler prn, Duonebs prn 2nd line    Pancytopenia anticipated, chemotherapy-induced  Transfuse 1 unit RBCs for hematocrit <21% and/or transfuse 1 unit platelets for platelet count <11,000 OR transfuse 1 unit platelets for platelet count <20,000 if bleeding or fever.  Bleeding is defined as evidence of external (e.g. melena, gross hematuria, hemoptysis, or hematemesis) or internal (within an organ or cavity).   Fever is defined as a temperature >37.9.  Methotrexate (administered for GVHD prophylaxis) may delay count recovery in the short term    At risk for thromboses due to cancer diagnosis  Enoxaparin; hold for platelet count <30,000 or bleeding    At risk for head injury due to thrombocytopenia  Helmet for safety during periods of thrombocytopenia, per hospital policy  Immunocompromise due to disease and conditioning, compounded by immunosuppression for GVHD prophylaxis  At FIRST temperature spike >37.9   Order cultures  Obtain blood cultures (1 set of bactacs) from each central line port & one set of blood cultures drawn by venipuncture   Begin broad spectrum antibiotics with cefepime and flagyl (follow febrile neutropenia SOP)    At risk for opportunistic infection: microbial prophylaxis and infection surveillance  At risk for HSV/VZV reactivation due to immunocompromised status  Recipient is HSV sero-positive   Recipient is VZV sero-positive   Viral prophylaxis with acyclovir    At risk for CMV reactivation due to immunocompromised status  Recipient is CMV sero-negative and donor is CMV sero-negative  Recipient and donor are CMV sero-negative, CMV monitoring not required    At risk for candidal/fungal infection due to immunocompromised status  Fungal prophylaxis with fluconazole    At risk for GVHD due to allogeneic stem cell source  Planned GVHD prophylaxis with tacrolimus and methotrexate    Tacro by  continuous IV infusion beginning on day-1 via PIV  Target tacrolimus level 8-12    Methotrexate will dose equivalent leucovorin will be adminstered as follows:   15 mg/m2 on day +1 (>24 hrs from stem cell infusion)  10 mg/m2 on days +3, +6, and +11     At risk for renal injury due to medications (e.g. tacrolimus)  Creatinine at admission was 0.7    At risk for fluid imbalance  Encouraged oral intake  IVF if daily oral intake <1569m by 11pm    At risk for electrolyte imbalance  Replace electrolytes as to the following parameters  Hypokalemia, supplement when serum K <3.6  Hypomagnesemia, supplement when serum Mag <1.5  Hypocalcemia, supplement when corrected calcium <8.9     At risk for malnutrition and/or hypoalbuminemia, anticipated, due to chemotherapy +/- mucositis  Regular diet  Albumin level on admission was 4.3  Nutritional consult requested for nutrition assessment at admission    At risk for nausea +/- emesis, anticipated, due to chemotherapy  Emetic prophylaxis with granisitron and dexamethasone until 48 hours after conditioning  Also has rescue anti-emetics (prn promethazine, lorazepam)    At risk for diarrhea, anticipated, due to chemotherapy, medications  If develops diarrhea and meets criteria for watery stool will send for C difficile testing  If C diff test negative will provide imodium (loperamide) PRN  If abdominal cramping consider simethicone and or PRN ativan (lorazepam)    At risk for stress ulcer due to chemotherapy  GI prophylaxis with pantoprazole    At risk for VOD/SOS due to myeloablative conditioning regimen  Moderate risk of VOD/SOD with ablative BU/Flu  Ursodiol and enoxaparin (enoxaparin also used for DVT prophylaxis) prophylaxis in the myoloablative setting (particularly busulfan), concurrent transaminitis, myelofibrosis, other liver pathology and/or if the patient has other risk factors for VOD/SOS (e.g. certain pre-BMT treatments, second transplant)    At risk for  mucositis, anticipated, due to conditioning regimen  Moderate risk of mucositis with ablative BU/Flu   Methotrexate may worsen mucositis in the short term  Cont good oral care, normal saline rinses  Provide BMX and pain medications as needed    At risk for deconditioning due to intensity of treatment and potential decrease in physical activity   Encouraged physical activity and ambulation during inpatient stay to decrease the risks of pneumonia, decubuti, DVT and to lessen the degree of deconditioning  Consult physical therapy if needed    Psychosocial:   Emotional support offered  Social Worker notified of admission    Safety  In addition to other patient/family education and per unit standard of care, the admitting RN discussed safety information.   This included, but was not limited to, handwashing, N95 mask use, food safety, off unit restriction, visitation, and use of call bell for assistance, fall prevention, and helmet use.   Reinforce as needed    Patient/Family Education:  Discussed patient specific transplant-related information with patient today.  This included, but was not limited to, the conditioning regimen, common side effects of conditioning (alopecia, nausea/emesis, mucositis, potential organ toxicity), risk of infection, risk of bleeding, risk of GVHD, and the importance of GVHD prophylaxis to minimize this risk.  The patient indicated an understanding  Reinforce as needed    Smoking Cessation  Offered nicotine replacement & [place]oking Cessation Program; patient not needing any yet    Dispo planning  Anticipate discharge following engraftment and clinical stability  BMT consent signed  BMT attending [name]  Referring oncologist (please list referring & PCP, this step was requested at a recent patient care meeting, thanks) [name]   Primary care physician [name]  RN coordinator Anne/Sharon  Lives in [place], [place]    Lab Data      Lab results:  05/13/15  0107 05/12/15  0527 05/11/15  1649 05/09/15  0758   WBC 44.3* 56.1* 43.7* 41.0*   Hemoglobin 6.5* 7.2* 7.1* 8.0*   Hematocrit 21* 23* 22* 25*   RBC 2.2* 2.5* 2.4* 2.8*   Platelets 54* 62* 59* 75*   Neut # K/uL 32.8* 38.7* 27.1* 24.6*   Lymph # K/uL 2.2 4.5* 2.6 1.6   Mono # K/uL 0.0* 1.4* 0.4 0.0*   Eos # K/uL 0.4 0.0 0.0 0.0   Baso # K/uL 0.0 0.0 0.0 0.0   Seg Neut % 56.0 49.3 53.5 47.8   Lymphocyte % 5.3 7.6 6.1 3.5   Monocyte % 0.0 2.5 0.9 0.0   Eosinophil % 0.9 0.0 0.0 0.0   Basophil % 0.0 0.0 0.0 0.0   Bands % 18* 20* 8 12*   Myelocyte % 4* 2* 12* 20*   Metamyelocyte % 13* 17* 12* 5*   Promyelocyte % 1*  --  4* 6*   Blasts % 2* 2* 3* 5*               Lab results: 05/13/15  0107 05/12/15  0527 05/11/15  1649   Sodium 139 137 140   Potassium 4.4 4.8 4.0   Chloride 101 100 100   CO2 25 21 23    UN 15 17 14    Creatinine 0.86 0.82 0.98*   GFR,Caucasian 76 81 65   GFR,Black 88 93 75   Glucose 134* 165* 96   Calcium 8.9 9.1 8.9           Lab results: 05/13/15  0107   Magnesium 1.7       Lab Results   Component Value Date    ALT 12 05/13/2015    AST 34 05/13/2015     Lab Results   Component Value Date    ALK 60 05/13/2015     Lab Results   Component Value Date    TB 0.3 05/13/2015     LD   Date Value Ref Range Status   05/13/2015 1079 (H) 118 - 225 U/L Final     LD, PL   Date Value Ref Range Status  05/09/2015 1146 (H) 118 - 225 U/L Final     LDL Calculated   Date Value Ref Range Status   08/27/2013 113 mg/dL Final     Comment:     REFERENCE RANGE:  < 100 Optimal                  100-129 Near or above optimal                  130-159 Borderline High                  160-189 High                    > 189 Very High

## 2015-05-13 NOTE — Plan of Care (Signed)
Comfort - BMT-related     Nausea controlled or eliminated Maintaining     Diarrhea controlled or eliminated Maintaining     Without infection Maintaining        Graft Versus Host Disease (GVHD) Bundle     Without rash Maintaining     Stool output within parameters (</= 539ml in 24 h) Maintaining     Total bilirubin </= 2 Maintaining        Mobility     Patient's functional status is maintained or improved Maintaining        Nutrition     Patient's nutritional status is maintained or improved Maintaining        Pain/Comfort     Patient's pain or discomfort is manageable Maintaining        Psychosocial     Demonstrates ability to cope with illness Maintaining        Safety     Patient will remain free of falls Maintaining        Pt avss throughout shift with no complaints.

## 2015-05-13 NOTE — Plan of Care (Signed)
Comfort - BMT-related     Nausea controlled or eliminated Maintaining     Diarrhea controlled or eliminated Maintaining     Without infection Maintaining        Graft Versus Host Disease (GVHD) Bundle     Without rash Maintaining     Stool output within parameters (</= 548ml in 24 h) Maintaining     Total bilirubin </= 2 Maintaining        Mobility     Patient's functional status is maintained or improved Maintaining        Nutrition     Patient's nutritional status is maintained or improved Maintaining        Pain/Comfort     Patient's pain or discomfort is manageable Maintaining        Psychosocial     Demonstrates ability to cope with illness Maintaining        Safety     Patient will remain free of falls Maintaining              Carly Higgins, 55 y.o. female is here for chemotherapy infusion of: fludarabine 75 mg over one hour and busulfan 285 mg over three hours  Schema Day:- 3   Dose verified by 2 Rn's [x] YES [] NO  Pt identification verified by 2 Rn's [x] YES [] NO  Treatment plan verified by 2 Rn's by provider note: [x] YES [] NO  Consent present in chart: [x] YES [] NO  Premeds given: [x] YES [] NO  IV device: [] TCVC[x] IVAD[] PICC[] PIV  IV therapy site and patency before infusion: subclavian right, condition patent and no redness  IV Fluid given: [] YES [x] NO  Blood return present before infusion: [x] YES [] NO  Blood return present after infusion: [x] YES [] NO  IV therapy site and patency after infusion: subclavian right, condition patent and no redness  Patient tolerated transfusion well    Miralax x1 and colace PRN x1 for constipation with no effect as of yet. Albuterol inhaler PRN x1 for SOB with positive effect.

## 2015-05-13 NOTE — Progress Notes (Signed)
IM/PCP    Stopped by to see her  No complaints   Aware of  Plan   Will follow in periphery   Appreciate Excellent care    Erin Sons MD

## 2015-05-14 LAB — BASIC METABOLIC PANEL
Anion Gap: 13 (ref 7–16)
CO2: 25 mmol/L (ref 20–28)
Calcium: 9.2 mg/dL (ref 8.6–10.2)
Chloride: 98 mmol/L (ref 96–108)
Creatinine: 0.66 mg/dL (ref 0.51–0.95)
GFR,Black: 115 *
GFR,Caucasian: 100 *
Glucose: 191 mg/dL — ABNORMAL HIGH (ref 60–99)
Lab: 17 mg/dL (ref 6–20)
Potassium: 4.6 mmol/L (ref 3.3–5.1)
Sodium: 136 mmol/L (ref 133–145)

## 2015-05-14 LAB — CBC AND DIFFERENTIAL
Baso # K/uL: 0 10*3/uL (ref 0.0–0.1)
Basophil %: 0 %
Eos # K/uL: 0 10*3/uL (ref 0.0–0.4)
Eosinophil %: 0 %
Hematocrit: 25 % — ABNORMAL LOW (ref 34–45)
Hemoglobin: 7.9 g/dL — ABNORMAL LOW (ref 11.2–15.7)
Lymph # K/uL: 1.1 10*3/uL — ABNORMAL LOW (ref 1.2–3.7)
Lymphocyte %: 2.6 %
MCH: 29 pg/cell (ref 26–32)
MCHC: 32 g/dL (ref 32–36)
MCV: 92 fL (ref 79–95)
Mono # K/uL: 0.3 10*3/uL (ref 0.2–0.9)
Monocyte %: 0.9 %
Neut # K/uL: 29 10*3/uL — ABNORMAL HIGH (ref 1.6–6.1)
Nucl RBC # K/uL: 0.1 10*3/uL — ABNORMAL HIGH (ref 0.0–0.0)
Nucl RBC %: 0.2 /100 WBC (ref 0.0–0.2)
Platelets: 47 10*3/uL — ABNORMAL LOW (ref 160–370)
RBC: 2.7 MIL/uL — ABNORMAL LOW (ref 3.9–5.2)
RDW: 21.2 % — ABNORMAL HIGH (ref 11.7–14.4)
Seg Neut %: 71.9 %
WBC: 35.4 10*3/uL — ABNORMAL HIGH (ref 4.0–10.0)

## 2015-05-14 LAB — RED BLOOD CELLS
Coded Blood type: 6200
Component blood type: A POS
Dispense status: TRANSFUSED

## 2015-05-14 LAB — DIFF MANUAL
Bands %: 10 % (ref 0–10)
Diff Based On: 114 CELLS
Metamyelocyte %: 4 % — ABNORMAL HIGH (ref 0–1)
Myelocyte %: 9 % — ABNORMAL HIGH (ref 0–0)
Promyelocyte %: 2 % — ABNORMAL HIGH (ref 0–0)

## 2015-05-14 LAB — MAGNESIUM: Magnesium: 1.8 mEq/L (ref 1.3–2.1)

## 2015-05-14 NOTE — Interdisciplinary Rounds (Signed)
Interdisciplinary Rounds Note    Date: 05/14/2015   Time: 8:48 AM   Attendance:  Attending, PA, Charge RN    Admit Date/Time:  05/11/2015  2:02 PM    Principal Problem: <principal problem not specified>  Problem List:   Patient Active Problem List    Diagnosis Date Noted    Myeloproliferative disease 05/11/2015    MPN (myeloproliferative neoplasm) 01/19/2014    Leukocytosis 09/22/2013       The patient's problem list and interdisciplinary care plan was reviewed.    Discharge Planning                 *Does patient currently have home care services?: No     *Current External Services: None                      Plan    05/14/15 Day -3.  Will receive Flu/Bu tonight.  Pt tolerating chemo well.  Continue supportive care.     Anticipated Discharge Date:     Discharge Disposition: TBD

## 2015-05-14 NOTE — Progress Notes (Signed)
Report Given To  Rolan Lipa, RN      Descriptive Sentence / Reason for Admission     05/14/15 Day -3 MUD for Myeloproliferative Neoplasm        Active Issues / Relevant Events     Pt is a smoker, quit day of admission, has nicotine patch un upper R shoulder - lozenges PRN if needed    Tolerating busulfan/fludarabine    Bleeding at IVAD site, dressed with occlusive gauze dressing.     Pt has not had BM since Wednesday, gave colace and miralax on evenings.           To Do List    Monitor for nausea and need for nicotine replacement.     Gauze dressing d/t be changed 5/6    Monitor for stools/need for more laxatives.           Anticipatory Guidance / Discharge Planning    TBD

## 2015-05-14 NOTE — Progress Notes (Signed)
Blood & Marrow Transplant Program APP Progress Note    CC   Carly Higgins is a 55 y.o. 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 By [employer] testing, she has mutations of TET2, ASXL1, CCND2, GATA2, SRSF2.     Admitted for MUD allogeneic PBSC transplant.     Length of stay  LOS: 3 days     Interval History   Patient reports some mild nausea, offers no other complaints.  Tolerating Bu/Flu well.      Review of Systems   A comprehensive review of 13 systems was performed; pertinent positives checked below:  []  fevers  []  fast pulse []  urinary frequency   []  chills []  chest pain []  dysuria    []  dry eyes [x]  nausea/emesis  []  rash   []  ear pain []  abdominal cramping []  skin changes   []  sores in mouth []  abdominal discomfort []  pain   []  sore throat []  loose stool, non-formed []  none of the above   []  cough  []  diarrhea, watery    []  sputum production  []  weight loss    []  shortness of breath []  insufficient oral intake         Vital Signs and I&O   Temp:  [36 C (96.8 F)-36.8 C (98.2 F)] 36.4 C (97.5 F)  Heart Rate:  [81-89] 87  Resp:  [18-20] 18  BP: (127-154)/(77-90) 134/77    Intake/Output Summary (Last 24 hours) at 05/14/15 1349  Last data filed at 05/14/15 0558   Gross per 24 hour   Intake           945.28 ml   Output             2200 ml   Net         -1254.72 ml        Physical Exam     Head:  Normocephalic, atraumatic   Eyes:  Conjunctiva/corneas clear   Throat: Oral mucosa pink & moist; no pharyngeal exudate   Lungs:   CTA B/L, effort unlabored    Heart:  RRR, no murmur/rub/gallop   Abdomen:   Soft, nontender, BS present    Extremities: No edema   Pulses: 2+ and symmetric   Skin: No rashes or lesions   Neurologic: A&O X 3, CMS intact   Central venous access: IVAD - WNL      Assessment & Plan   Carly Higgins is a 55 yo female with unclassifiable myeloproliferative neoplasm.  By [employer] testing, she has mutations of TET2, ASXL1,  CCND2, GATA2, SRSF2. Admitted for MUD allogeneic PBSCT.     Atypical MPN  Completed Decitabine 87m/m2 IV daily x 5 days - Cycle # 1 12/15/14  On intermittent/dose adjusted hydroxyurea at admission - discontinued 5/5 in setting of falling WBC     Day -5 thru -2 (5/4-5/7):  Flu 477mm2, Bu 13053m2 - Busulfan dose adjusted to 285m62mr last 2 doses (from 244mg5may -1 (5/8): Rest day  Day 0 (5/9): MUD (10/10 HLA, female) allo PBSCT, ABO: A+/A+, CMV: neg/neg     Today is BMT day -3    Pancytopenia  Transfuse 1 unit RBCs for hematocrit <21% and/or transfuse 1 unit platelets for platelet count <11,000 OR transfuse 1 unit platelets for platelet count <20,000 if bleeding or fever.  Helmet for safety during periods of thrombocytopenia, per hospital policy    DVT ppx  Enoxaparin - hold for platelet count <30,000  or bleeding    ID  At FIRST temperature spike >37.9   Order cultures  Obtain blood cultures (1 set of bactacs) from each central line port & one set of blood cultures drawn by venipuncture   Begin broad spectrum antibiotics with cefepime and flagyl (follow febrile neutropenia SOP)      Viral prophylaxis with acyclovir  Fungal prophylaxis with fluconazole    CV/pulm  Albuterol, Duonebs PRN wheezing     GVHD  Planned GVHD prophylaxis with tacrolimus and methotrexate    Tacro by continuous IV infusion beginning on day-1 via PIV  Target tacrolimus level 8-12    Methotrexate will dose equivalent leucovorin will be adminstered as follows:   15 mg/m2 on day +1 (>24 hrs from stem cell infusion)  10 mg/m2 on days +3, +6, and +11     Renal  Creatinine at admission was 0.7  Encouraged oral intake  Cont allopurinol     Electrolyte imbalance  Replace electrolytes as to the following parameters  Hypokalemia, supplement when serum K <3.6  Hypomagnesemia, supplement when serum Mag <1.5  Hypocalcemia, supplement when corrected calcium <8.9     At risk for malnutrition/hypoalbuminemia  Regular diet  Albumin level on  admission 4.3  Nutrition following     Nausea +/- emesis  Emetic prophylaxis with granisitron and dexamethasone until 48 hours after conditioning  PRN Phenergan, Ativan     Diarrhea  Send for C diff as clinically indicated  PRN Imodium if C diff negative     Stress ulcer ppx  Pantoprazole    VOD/SOS  Moderate risk with ablative BU/Flu  Ursodiol ppx    Mucositis  Moderate risk with ablative BU/Flu   Methotrexate may worsen in the short term  Cont good oral care, normal saline rinses  Provide BMX and pain medications as needed    At risk for deconditioning   Encouraged physical activity, ambulation  Consult PT if needed    Psychosocial  Cont emotional support     Smoking Cessation  Offered nicotine replacement &  Smoking Cessation Program; patient refusing at this time    Dispo planning  Anticipate discharge following engraftment and clinical stability  BMT consent signed  BMT attending [name]  Primary care physician [name]  RN coordinator Anne/Sharon  Lives in [place]    Patient seen and discussed with Dr. Jaclyn Prime, PA    Lab Data      Lab results: 05/14/15  0106 05/13/15  0107 05/12/15  0527 05/11/15  1649   WBC 35.4* 44.3* 56.1* 43.7*   Hemoglobin 7.9* 6.5* 7.2* 7.1*   Hematocrit 25* 21* 23* 22*   RBC 2.7* 2.2* 2.5* 2.4*   Platelets 47* 54* 62* 59*   Neut # K/uL 29.0* 32.8* 38.7* 27.1*   Lymph # K/uL 1.1* 2.2 4.5* 2.6   Mono # K/uL 0.3 0.0* 1.4* 0.4   Eos # K/uL 0.0 0.4 0.0 0.0   Baso # K/uL 0.0 0.0 0.0 0.0   Seg Neut % 71.9 56.0 49.3 53.5   Lymphocyte % 2.6 5.3 7.6 6.1   Monocyte % 0.9 0.0 2.5 0.9   Eosinophil % 0.0 0.9 0.0 0.0   Basophil % 0.0 0.0 0.0 0.0   Bands % 10 18* 20* 8   Myelocyte % 9* 4* 2* 12*   Metamyelocyte % 4* 13* 17* 12*   Promyelocyte % 2* 1*  --  4*   Blasts %  --  2* 2* 3*               Lab results: 05/14/15  0106 05/13/15  0107 05/12/15  0527   Sodium 136 139 137   Potassium 4.6 4.4 4.8   Chloride 98 101 100   CO2 25 25 21    UN 17 15 17     Creatinine 0.66 0.86 0.82   GFR,Caucasian 100 76 81   GFR,Black 115 88 93   Glucose 191* 134* 165*   Calcium 9.2 8.9 9.1           Lab results: 05/14/15  0106   Magnesium 1.8       Lab Results   Component Value Date    ALT 12 05/13/2015    AST 34 05/13/2015     Lab Results   Component Value Date    ALK 60 05/13/2015     Lab Results   Component Value Date    TB 0.3 05/13/2015     LD   Date Value Ref Range Status   05/13/2015 1079 (H) 118 - 225 U/L Final     LD, PL   Date Value Ref Range Status   05/09/2015 1146 (H) 118 - 225 U/L Final     LDL Calculated   Date Value Ref Range Status   08/27/2013 113 mg/dL Final     Comment:     REFERENCE RANGE:  < 100 Optimal                  100-129 Near or above optimal                  130-159 Borderline High                  160-189 High                    > 189 Very High

## 2015-05-14 NOTE — Plan of Care (Signed)
Comfort - BMT-related    • Diarrhea controlled or eliminated Maintaining    • Without infection Maintaining        Graft Versus Host Disease (GVHD) Bundle    • Without rash Maintaining    • Stool output within parameters (</= 500ml in 24 h) Maintaining    • Total bilirubin </= 2 Maintaining        Mobility    • Patient's functional status is maintained or improved Maintaining        Nutrition    • Patient's nutritional status is maintained or improved Maintaining        Pain/Comfort    • Patient's pain or discomfort is manageable Maintaining        Psychosocial    • Demonstrates ability to cope with illness Maintaining        Safety    • Patient will remain free of falls Maintaining          Comfort - BMT-related    • Nausea controlled or eliminated Progressing towards goal

## 2015-05-15 LAB — CBC AND DIFFERENTIAL
Baso # K/uL: 0 10*3/uL (ref 0.0–0.1)
Basophil %: 0 %
Eos # K/uL: 0 10*3/uL (ref 0.0–0.4)
Eosinophil %: 0 %
Hematocrit: 26 % — ABNORMAL LOW (ref 34–45)
Hemoglobin: 8.3 g/dL — ABNORMAL LOW (ref 11.2–15.7)
Lymph # K/uL: 0.4 10*3/uL — ABNORMAL LOW (ref 1.2–3.7)
Lymphocyte %: 1.7 %
MCH: 29 pg/cell (ref 26–32)
MCHC: 32 g/dL (ref 32–36)
MCV: 91 fL (ref 79–95)
Mono # K/uL: 0 10*3/uL — ABNORMAL LOW (ref 0.2–0.9)
Monocyte %: 0 %
Neut # K/uL: 17.8 10*3/uL — ABNORMAL HIGH (ref 1.6–6.1)
Nucl RBC # K/uL: 0 10*3/uL (ref 0.0–0.0)
Nucl RBC %: 0.1 /100 WBC (ref 0.0–0.2)
Platelets: 47 10*3/uL — ABNORMAL LOW (ref 160–370)
RBC: 2.9 MIL/uL — ABNORMAL LOW (ref 3.9–5.2)
RDW: 20.9 % — ABNORMAL HIGH (ref 11.7–14.4)
Seg Neut %: 87.1 %
WBC: 19.5 10*3/uL — ABNORMAL HIGH (ref 4.0–10.0)

## 2015-05-15 LAB — BASIC METABOLIC PANEL
Anion Gap: 15 (ref 7–16)
CO2: 24 mmol/L (ref 20–28)
Calcium: 8.8 mg/dL (ref 8.6–10.2)
Chloride: 97 mmol/L (ref 96–108)
Creatinine: 0.67 mg/dL (ref 0.51–0.95)
GFR,Black: 114 *
GFR,Caucasian: 99 *
Glucose: 192 mg/dL — ABNORMAL HIGH (ref 60–99)
Lab: 23 mg/dL — ABNORMAL HIGH (ref 6–20)
Potassium: 4.6 mmol/L (ref 3.3–5.1)
Sodium: 136 mmol/L (ref 133–145)

## 2015-05-15 LAB — MAGNESIUM: Magnesium: 1.9 mEq/L (ref 1.3–2.1)

## 2015-05-15 LAB — DIFF MANUAL
Bands %: 4 % (ref 0–10)
Diff Based On: 116 CELLS
Metamyelocyte %: 5 % — ABNORMAL HIGH (ref 0–1)
Myelocyte %: 2 % — ABNORMAL HIGH (ref 0–0)

## 2015-05-15 MED ORDER — NICOTINE 14 MG/24HR TD PT24 *I*
1.0000 | MEDICATED_PATCH | Freq: Every day | TRANSDERMAL | Status: DC
Start: 2015-05-15 — End: 2015-06-07
  Administered 2015-05-17 – 2015-06-07 (×3): 1 via TRANSDERMAL
  Filled 2015-05-15 (×25): qty 1

## 2015-05-15 MED ORDER — POLYETHYLENE GLYCOL 3350 PO PACK 17 GM *I*
17.0000 g | PACK | Freq: Every evening | ORAL | Status: DC | PRN
Start: 2015-05-15 — End: 2015-06-07

## 2015-05-15 NOTE — Progress Notes (Signed)
Blood & Marrow Transplant Program APP Progress Note    CC   Carly Higgins is a 55 y.o. 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 By [employer] testing, she has mutations of TET2, ASXL1, CCND2, GATA2, SRSF2.     Admitted for MUD allogeneic PBSC transplant.     Length of stay  LOS: 4 days     Interval History   Continues with fatigue and nausea this morning, though states it is improved since yesterday.  Encouraged PO intake as tolerated, ambulating.  Rest day tomorrow.    Review of Systems   A comprehensive review of 13 systems was performed; pertinent positives checked below:  []  fevers  []  fast pulse [x]  fatigue   []  chills []  chest pain []  dysuria    []  dry eyes [x]  nausea/emesis  []  rash   []  ear pain []  abdominal cramping []  skin changes   []  sores in mouth []  abdominal discomfort []  pain   []  sore throat []  loose stool, non-formed []  none of the above   []  cough  []  diarrhea, watery    []  sputum production  []  weight loss    []  shortness of breath []  insufficient oral intake      Vital Signs and I&O   Temp:  [36 C (96.8 F)-37.1 C (98.8 F)] 36.6 C (97.9 F)  Heart Rate:  [73-88] 81  Resp:  [18] 18  BP: (105-140)/(74-90) 138/88    Intake/Output Summary (Last 24 hours) at 05/15/15 1316  Last data filed at 05/15/15 1229   Gross per 24 hour   Intake             2840 ml   Output             2300 ml   Net              540 ml        Physical Exam     Head:  Normocephalic, atraumatic   Eyes:  Conjunctiva/corneas clear   Throat: Oral mucosa pink & moist; no pharyngeal exudate   Lungs:   CTA B/L, effort unlabored    Heart:  RRR, no murmur/rub/gallop   Abdomen:   Soft, nontender, BS present    Extremities: No edema   Pulses: 2+ and symmetric   Skin: No rashes or lesions   Neurologic: A&O X 3, CMS intact   Central venous access: IVAD - WNL      Assessment & Plan   Carly Higgins is a 55 yo female with unclassifiable myeloproliferative neoplasm.  By  [employer] testing, she has mutations of TET2, ASXL1, CCND2, GATA2, SRSF2. Admitted for MUD allogeneic PBSCT.     Atypical MPN  Completed Decitabine 80m/m2 IV daily x 5 days - Cycle # 1 12/15/14  On intermittent/dose adjusted hydroxyurea at admission - discontinued 5/5 in setting of falling WBC     Day -5 thru -2 (5/4-5/7):  Flu 447mm2, Bu 13060m2 - Busulfan dose adjusted to 285m10mr last 2 doses (from 244mg73may -1 (5/8): Rest day  Day 0 (5/9): MUD (10/10 HLA, female) allo PBSCT, ABO: A+/A+, CMV: neg/neg     Today is BMT day -2    Pancytopenia  Transfuse 1 unit RBCs for hematocrit <21% and/or transfuse 1 unit platelets for platelet count <11,000 OR transfuse 1 unit platelets for platelet count <20,000 if bleeding or fever.  Helmet for safety during periods of thrombocytopenia, per hospital policy  DVT ppx  Enoxaparin - hold for platelet count <30,000 or bleeding    ID  At FIRST temperature spike >37.9   Order cultures  Obtain blood cultures (1 set of bactacs) from each central line port & one set of blood cultures drawn by venipuncture   Begin broad spectrum antibiotics with cefepime and flagyl (follow febrile neutropenia SOP)      Viral prophylaxis with acyclovir  Fungal prophylaxis with fluconazole    CV/pulm  Albuterol, Duonebs PRN wheezing     GVHD  Planned GVHD prophylaxis with tacrolimus and methotrexate    Tacro by continuous IV infusion beginning on day-1 via PIV  Target tacrolimus level 8-12    Methotrexate will dose equivalent leucovorin will be adminstered as follows:   15 mg/m2 on day +1 (>24 hrs from stem cell infusion)  10 mg/m2 on days +3, +6, and +11     Renal  Creatinine at admission was 0.7  Encouraged oral intake  Cont allopurinol     Electrolyte imbalance  Replace electrolytes as to the following parameters  Hypokalemia, supplement when serum K <3.6  Hypomagnesemia, supplement when serum Mag <1.5  Hypocalcemia, supplement when corrected calcium <8.9     At risk for  malnutrition/hypoalbuminemia  Regular diet  Albumin level on admission 4.3  Nutrition following     Nausea +/- emesis  Emetic prophylaxis with granisitron and dexamethasone until 48 hours after conditioning  PRN Phenergan, Ativan     Diarrhea  Send for C diff as clinically indicated  PRN Imodium if C diff negative     Stress ulcer ppx  Pantoprazole    VOD/SOS  Moderate risk with ablative BU/Flu  Ursodiol ppx    Mucositis  Moderate risk with ablative BU/Flu   Methotrexate may worsen in the short term  Cont good oral care, normal saline rinses  Provide BMX and pain medications as needed    At risk for deconditioning   Encouraged physical activity, ambulation  Consult PT if needed    Psychosocial  Cont emotional support     Smoking Cessation  Offered nicotine replacement & [place] [facility]oking Cessation [facility]; patient refusing at this time    Dispo planning  Anticipate discharge following engraftment and clinical stability  BMT consent signed  BMT attending [name]  Primary care physician [name]  RN coordinator Anne/Sharon  Lives in [place]    Patient seen and discussed with Dr. Jaclyn Prime, PA    Lab Data      Lab results: 05/15/15  0112 05/14/15  0106 05/13/15  0107 05/12/15  0527 05/11/15  1649   WBC 19.5* 35.4* 44.3* 56.1* 43.7*   Hemoglobin 8.3* 7.9* 6.5* 7.2* 7.1*   Hematocrit 26* 25* 21* 23* 22*   RBC 2.9* 2.7* 2.2* 2.5* 2.4*   Platelets 47* 47* 54* 62* 59*   Neut # K/uL 17.8* 29.0* 32.8* 38.7* 27.1*   Lymph # K/uL 0.4* 1.1* 2.2 4.5* 2.6   Mono # K/uL 0.0* 0.3 0.0* 1.4* 0.4   Eos # K/uL 0.0 0.0 0.4 0.0 0.0   Baso # K/uL 0.0 0.0 0.0 0.0 0.0   Seg Neut % 87.1 71.9 56.0 49.3 53.5   Lymphocyte % 1.7 2.6 5.3 7.6 6.1   Monocyte % 0.0 0.9 0.0 2.5 0.9   Eosinophil % 0.0 0.0 0.9 0.0 0.0   Basophil % 0.0 0.0 0.0 0.0 0.0   Bands % 4 10 18* 20* 8   Myelocyte %  2* 9* 4* 2* 12*   Metamyelocyte % 5* 4* 13* 17* 12*   Promyelocyte %  --  2* 1*  --  4*   Blasts %  --   --  2* 2* 3*                Lab results: 05/15/15  0112 05/14/15  0106 05/13/15  0107   Sodium 136 136 139   Potassium 4.6 4.6 4.4   Chloride 97 98 101   CO2 24 25 25    UN 23* 17 15   Creatinine 0.67 0.66 0.86   GFR,Caucasian 99 100 76   GFR,Black 114 115 88   Glucose 192* 191* 134*   Calcium 8.8 9.2 8.9           Lab results: 05/15/15  0112   Magnesium 1.9       Lab Results   Component Value Date    ALT 12 05/13/2015    AST 34 05/13/2015     Lab Results   Component Value Date    ALK 60 05/13/2015     Lab Results   Component Value Date    TB 0.3 05/13/2015     LD   Date Value Ref Range Status   05/13/2015 1079 (H) 118 - 225 U/L Final     LD, PL   Date Value Ref Range Status   05/09/2015 1146 (H) 118 - 225 U/L Final     LDL Calculated   Date Value Ref Range Status   08/27/2013 113 mg/dL Final     Comment:     REFERENCE RANGE:  < 100 Optimal                  100-129 Near or above optimal                  130-159 Borderline High                  160-189 High                    > 189 Very High

## 2015-05-15 NOTE — Progress Notes (Signed)
[name], 55 y.o. female is here for chemotherapy infusion of: busulfan and fludarabine  Schema Day:- 3  Dose verified by 2 Rn's [x] YES [] NO  Pt identification verified by 2 Rn's  [x] YES [] NO  Treatment plan verified by 2 Rn's by provider note: [x] YES [] NO  Consent present in chart: [x] YES [] NO  Premeds given: [x] YES [] NO  IV device: [] TCVC[x] IVAD[] PICC[] PIV  IV therapy site and patency before infusion:R IVAD  IV Fluid given: [] YES [x] NO  Blood return present before infusion: [x] YES [] NO  IV therapy site and patency during infusion: R IVAD  Blood return present during infusion:[x] YES [] NO  Dose given: 285mg  busulfan/ 40mg  fludarabine  Blood return present after infusion: [x] YES [] NO  IV therapy site and patency after infusion: R IVAD  Patient tolerated infusions well- AVSS, no signs or symptoms of transfusion reaction.     Overton Mam, BSN, Therapist, sports.

## 2015-05-15 NOTE — Progress Notes (Addendum)
PRN PO phenergan given x1 for nausea with positive effect. PIV placed for tacro starting 5/8

## 2015-05-15 NOTE — Interdisciplinary Rounds (Signed)
Interdisciplinary Rounds Note    Date: 05/15/2015   Time: 9:12 AM   Attendance:  Attending, PA, Charge RN    Admit Date/Time:  05/11/2015  2:02 PM    Principal Problem: <principal problem not specified>  Problem List:   Patient Active Problem List    Diagnosis Date Noted    Myeloproliferative disease 05/11/2015    MPN (myeloproliferative neoplasm) 01/19/2014    Leukocytosis 09/22/2013       The patient's problem list and interdisciplinary care plan was reviewed.    Discharge Planning                 *Does patient currently have home care services?: No     *Current External Services: None                      Plan    05/14/15 Day -3.  Will receive Flu/Bu tonight.  Pt tolerating chemo well.  Continue supportive care.   5/7: doing well, continue supportive care    Anticipated Discharge Date:     Discharge Disposition: TBD

## 2015-05-16 ENCOUNTER — Telehealth: Payer: Self-pay | Admitting: Hematology

## 2015-05-16 ENCOUNTER — Encounter: Payer: Self-pay | Admitting: Gastroenterology

## 2015-05-16 LAB — CBC AND DIFFERENTIAL
Baso # K/uL: 0 10*3/uL (ref 0.0–0.1)
Basophil %: 0 %
Eos # K/uL: 0 10*3/uL (ref 0.0–0.4)
Eosinophil %: 0 %
Hematocrit: 26 % — ABNORMAL LOW (ref 34–45)
Hemoglobin: 8.5 g/dL — ABNORMAL LOW (ref 11.2–15.7)
Lymph # K/uL: 0.2 10*3/uL — ABNORMAL LOW (ref 1.2–3.7)
Lymphocyte %: 1.8 %
MCH: 29 pg/cell (ref 26–32)
MCHC: 33 g/dL (ref 32–36)
MCV: 88 fL (ref 79–95)
Mono # K/uL: 0 10*3/uL — ABNORMAL LOW (ref 0.2–0.9)
Monocyte %: 0 %
Neut # K/uL: 7 10*3/uL — ABNORMAL HIGH (ref 1.6–6.1)
Nucl RBC # K/uL: 0 10*3/uL (ref 0.0–0.0)
Nucl RBC %: 0 /100 WBC (ref 0.0–0.2)
Platelets: 39 10*3/uL — ABNORMAL LOW (ref 160–370)
RBC: 3 MIL/uL — ABNORMAL LOW (ref 3.9–5.2)
RDW: 20.9 % — ABNORMAL HIGH (ref 11.7–14.4)
Seg Neut %: 90.2 %
WBC: 7.6 10*3/uL (ref 4.0–10.0)

## 2015-05-16 LAB — COMPREHENSIVE METABOLIC PANEL
ALT: 28 U/L (ref 0–35)
AST: 33 U/L (ref 0–35)
Albumin: 4.2 g/dL (ref 3.5–5.2)
Alk Phos: 56 U/L (ref 35–105)
Anion Gap: 16 (ref 7–16)
Bilirubin,Total: 0.4 mg/dL (ref 0.0–1.2)
CO2: 22 mmol/L (ref 20–28)
Calcium: 8.5 mg/dL — ABNORMAL LOW (ref 8.6–10.2)
Chloride: 97 mmol/L (ref 96–108)
Creatinine: 0.61 mg/dL (ref 0.51–0.95)
GFR,Black: 118 *
GFR,Caucasian: 102 *
Glucose: 243 mg/dL — ABNORMAL HIGH (ref 60–99)
Lab: 25 mg/dL — ABNORMAL HIGH (ref 6–20)
Potassium: 4.5 mmol/L (ref 3.3–5.1)
Sodium: 135 mmol/L (ref 133–145)
Total Protein: 6.7 g/dL (ref 6.3–7.7)

## 2015-05-16 LAB — DIFF MANUAL
Bands %: 2 % (ref 0–10)
Diff Based On: 113 CELLS
Metamyelocyte %: 4 % — ABNORMAL HIGH (ref 0–1)
Myelocyte %: 2 % — ABNORMAL HIGH (ref 0–0)

## 2015-05-16 LAB — MAGNESIUM: Magnesium: 1.8 mEq/L (ref 1.3–2.1)

## 2015-05-16 LAB — BUSULFAN LEVELS DOSE 1

## 2015-05-16 LAB — LACTATE DEHYDROGENASE: LD: 851 U/L — ABNORMAL HIGH (ref 118–225)

## 2015-05-16 LAB — PHOSPHORUS: Phosphorus: 3.6 mg/dL (ref 2.7–4.5)

## 2015-05-16 NOTE — Progress Notes (Signed)
Patient AVSS throughout course of shift, IV tacro started at 500 and tolerating well.

## 2015-05-16 NOTE — Progress Notes (Signed)
Blood & Marrow Transplant Program APP Progress Note    CC   Carly Higgins is a 55 y.o. 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 By [employer] testing, she has mutations of TET2, ASXL1, CCND2, GATA2, SRSF2.     Admitted for MUD allogeneic PBSC transplant.     Length of stay  LOS: 5 days     Interval History   Continues with fatigue and nausea this morning, though states it is improved since yesterday.  Encouraged PO intake as tolerated, ambulating.  Rest day tomorrow.    Review of Systems   A comprehensive review of 13 systems was performed; pertinent positives checked below:  []  fevers  []  fast pulse [x]  fatigue   []  chills []  chest pain []  dysuria    []  dry eyes []  nausea/emesis  []  rash   []  ear pain []  abdominal cramping []  skin changes   []  sores in mouth []  abdominal discomfort []  pain   []  sore throat []  loose stool, non-formed []  none of the above   [x]  cough  []  diarrhea, watery    []  sputum production  []  weight loss    []  shortness of breath []  insufficient oral intake      Vital Signs and I&O   Temp:  [36 C (96.8 F)-37.4 C (99.3 F)] 36 C (96.8 F)  Heart Rate:  [81-89] 84  Resp:  [18] 18  BP: (99-138)/(55-88) 126/82    Intake/Output Summary (Last 24 hours) at 05/16/15 0935  Last data filed at 05/16/15 0805   Gross per 24 hour   Intake           2379.7 ml   Output             3250 ml   Net           -870.3 ml        Physical Exam     Head:  Normocephalic, atraumatic   Eyes:  Conjunctiva/corneas clear   Throat: Oral mucosa pink & moist; no pharyngeal exudate   Lungs:   CTA B/L, effort unlabored    Heart:  RRR, no murmur/rub/gallop   Abdomen:   Soft, nontender, BS present    Extremities: No edema   Pulses: 2+ and symmetric   Skin: No rashes or lesions   Neurologic: A&O X 3, CMS intact   Central venous access: IVAD - WNL      Assessment & Plan   Carly Higgins is a 55 yo female with unclassifiable myeloproliferative neoplasm.  By  [employer] testing, she has mutations of TET2, ASXL1, CCND2, GATA2, SRSF2. Admitted for MUD allogeneic PBSCT.     Atypical MPN  Completed Decitabine 32m/m2 IV daily x 5 days - Cycle # 1 12/15/14  On intermittent/dose adjusted hydroxyurea at admission - discontinued 5/5 in setting of falling WBC     Day -5 thru -2 (5/4-5/7):  Flu 435mm2, Bu 13018m2 - Busulfan dose adjusted to 285m20mr last 2 doses (from 244mg76may -1 (5/8): Rest day  Day 0 (5/9): MUD (10/10 HLA, female) allo PBSCT, ABO: A+/A+, CMV: neg/neg     Today is BMT day -2    Pancytopenia  Transfuse 1 unit RBCs for hematocrit <21% and/or transfuse 1 unit platelets for platelet count <11,000 OR transfuse 1 unit platelets for platelet count <20,000 if bleeding or fever.  Helmet for safety during periods of thrombocytopenia, per hospital policy    DVT ppx  Enoxaparin - hold for platelet count <30,000 or bleeding    ID  At FIRST temperature spike >37.9   Order cultures  Obtain blood cultures (1 set of bactacs) from each central line port & one set of blood cultures drawn by venipuncture   Begin broad spectrum antibiotics with cefepime and flagyl (follow febrile neutropenia SOP)  Viral prophylaxis with acyclovir  Fungal prophylaxis with fluconazole    CV/pulm  Albuterol, Duonebs PRN wheezing     GVHD  Planned GVHD prophylaxis with tacrolimus and methotrexate    Tacro by continuous IV infusion beginning on day-1 via PIV  Target tacrolimus level 8-12    Methotrexate will dose equivalent leucovorin will be adminstered as follows:   15 mg/m2 on day +1 (>24 hrs from stem cell infusion)  10 mg/m2 on days +3, +6, and +11     Renal  Creatinine at admission was 0.7  Encouraged oral intake  Cont allopurinol     Electrolyte imbalance  Replace electrolytes as to the following parameters  Hypokalemia, supplement when serum K <3.6  Hypomagnesemia, supplement when serum Mag <1.5  Hypocalcemia, supplement when corrected calcium <8.9     At risk for  malnutrition/hypoalbuminemia  Regular diet  Albumin level on admission 4.3  Nutrition following     Nausea +/- emesis  Emetic prophylaxis with granisitron and dexamethasone until 48 hours after conditioning  PRN Phenergan, Ativan     Diarrhea  Send for C diff as clinically indicated  PRN Imodium if C diff negative     Stress ulcer ppx  Pantoprazole    VOD/SOS  Moderate risk with ablative BU/Flu  Ursodiol ppx    Mucositis  Moderate risk with ablative BU/Flu   Methotrexate may worsen in the short term  Cont good oral care, normal saline rinses  Provide BMX and pain medications as needed    At risk for deconditioning   Encouraged physical activity, ambulation  Consult PT if needed    Psychosocial  Cont emotional support     Smoking Cessation  Offered nicotine replacement & [place] [facility]oking Cessation [facility]; patient refusing at this time    Dispo planning  Anticipate discharge following engraftment and clinical stability  BMT consent signed  BMT attending [name]  Primary care physician [name]  RN coordinator Anne/Sharon  Lives in [place]    Patient seen and discussed with Dr. Maryjean Ka, MD    Lab Data      Lab results: 05/16/15  8469 05/15/15  0112 05/14/15  0106 05/13/15  0107 05/12/15  0527 05/11/15  1649   WBC 7.6 19.5* 35.4* 44.3* 56.1* 43.7*   Hemoglobin 8.5* 8.3* 7.9* 6.5* 7.2* 7.1*   Hematocrit 26* 26* 25* 21* 23* 22*   RBC 3.0* 2.9* 2.7* 2.2* 2.5* 2.4*   Platelets 39* 47* 47* 54* 62* 59*   Neut # K/uL 7.0* 17.8* 29.0* 32.8* 38.7* 27.1*   Lymph # K/uL 0.2* 0.4* 1.1* 2.2 4.5* 2.6   Mono # K/uL 0.0* 0.0* 0.3 0.0* 1.4* 0.4   Eos # K/uL 0.0 0.0 0.0 0.4 0.0 0.0   Baso # K/uL 0.0 0.0 0.0 0.0 0.0 0.0   Seg Neut % 90.2 87.1 71.9 56.0 49.3 53.5   Lymphocyte % 1.8 1.7 2.6 5.3 7.6 6.1   Monocyte % 0.0 0.0 0.9 0.0 2.5 0.9   Eosinophil % 0.0 0.0 0.0 0.9 0.0 0.0   Basophil % 0.0 0.0 0.0 0.0 0.0 0.0   Bands %  2 4 10  18* 20* 8   Myelocyte % 2* 2* 9* 4* 2* 12*   Metamyelocyte % 4* 5* 4* 13*  17* 12*   Promyelocyte %  --   --  2* 1*  --  4*   Blasts %  --   --   --  2* 2* 3*               Lab results: 05/16/15  0444 05/15/15  0112 05/14/15  0106   Sodium 135 136 136   Potassium 4.5 4.6 4.6   Chloride 97 97 98   CO2 22 24 25    UN 25* 23* 17   Creatinine 0.61 0.67 0.66   GFR,Caucasian 102 99 100   GFR,Black 118 114 115   Glucose 243* 192* 191*   Calcium 8.5* 8.8 9.2           Lab results: 05/16/15  0444   Magnesium 1.8       Lab Results   Component Value Date    ALT 28 05/16/2015    AST 33 05/16/2015     Lab Results   Component Value Date    ALK 56 05/16/2015     Lab Results   Component Value Date    TB 0.4 05/16/2015     LD   Date Value Ref Range Status   05/16/2015 851 (H) 118 - 225 U/L Final     LD, PL   Date Value Ref Range Status   05/09/2015 1146 (H) 118 - 225 U/L Final     LDL Calculated   Date Value Ref Range Status   08/27/2013 113 mg/dL Final     Comment:     REFERENCE RANGE:  < 100 Optimal                  100-129 Near or above optimal                  130-159 Borderline High                  160-189 High                    > 189 Very High

## 2015-05-16 NOTE — Interdisciplinary Rounds (Signed)
Interdisciplinary Rounds Note    Date: 05/16/2015   Time: 7:52 AM   Attendance:  Attending, PA, Charge RN    Admit Date/Time:  05/11/2015  2:02 PM    Principal Problem: <principal problem not specified>  Problem List:   Patient Active Problem List    Diagnosis Date Noted    Myeloproliferative disease 05/11/2015    MPN (myeloproliferative neoplasm) 01/19/2014    Leukocytosis 09/22/2013       The patient's problem list and interdisciplinary care plan was reviewed.    Discharge Planning                 *Does patient currently have home care services?: No     *Current External Services: None                      Plan    05/14/15 Day -3.  Will receive Flu/Bu tonight.  Pt tolerating chemo well.  Continue supportive care.   5/7: doing well, continue supportive care  5/8:  Day -1.  Mild nausea, fatigue.  Continue supportive care.          Anticipated Discharge Date:     Discharge Disposition: TBD

## 2015-05-16 NOTE — Telephone Encounter (Signed)
Notified of patient's admission 5/3, chemotherapy start on 5/4 and infusion of stem cells will be tomorrow 5/9

## 2015-05-17 ENCOUNTER — Other Ambulatory Visit
Admission: RE | Admit: 2015-05-17 | Discharge: 2015-05-17 | Disposition: A | Discharge status: Home / Self Care | Payer: Self-pay | Source: Ambulatory Visit | Attending: Pathology | Admitting: Pathology

## 2015-05-17 LAB — DIFF MANUAL
Bands %: 1 % (ref 0–10)
Diff Based On: 114 CELLS
Metamyelocyte %: 2 % — ABNORMAL HIGH (ref 0–1)
Myelocyte %: 2 % — ABNORMAL HIGH (ref 0–0)

## 2015-05-17 LAB — CBC AND DIFFERENTIAL
Baso # K/uL: 0 10*3/uL (ref 0.0–0.1)
Basophil %: 0 %
Eos # K/uL: 0 10*3/uL (ref 0.0–0.4)
Eosinophil %: 0 %
Hematocrit: 23 % — ABNORMAL LOW (ref 34–45)
Hemoglobin: 7.6 g/dL — ABNORMAL LOW (ref 11.2–15.7)
Lymph # K/uL: 0 10*3/uL — ABNORMAL LOW (ref 1.2–3.7)
Lymphocyte %: 0.9 %
MCH: 29 pg/cell (ref 26–32)
MCHC: 33 g/dL (ref 32–36)
MCV: 87 fL (ref 79–95)
Mono # K/uL: 0 10*3/uL — ABNORMAL LOW (ref 0.2–0.9)
Monocyte %: 0 %
Neut # K/uL: 3.2 10*3/uL (ref 1.6–6.1)
Nucl RBC # K/uL: 0 10*3/uL (ref 0.0–0.0)
Nucl RBC %: 0.3 /100 WBC — ABNORMAL HIGH (ref 0.0–0.2)
Platelets: 36 10*3/uL — ABNORMAL LOW (ref 160–370)
RBC: 2.6 MIL/uL — ABNORMAL LOW (ref 3.9–5.2)
RDW: 20.8 % — ABNORMAL HIGH (ref 11.7–14.4)
Seg Neut %: 94.6 %
WBC: 3.3 10*3/uL — ABNORMAL LOW (ref 4.0–10.0)

## 2015-05-17 LAB — TYPE AND SCREEN
ABO RH Blood Type: A POS
Antibody Screen: NEGATIVE

## 2015-05-17 LAB — BASIC METABOLIC PANEL
Anion Gap: 17 — ABNORMAL HIGH (ref 7–16)
CO2: 23 mmol/L (ref 20–28)
Calcium: 9.1 mg/dL (ref 8.6–10.2)
Chloride: 96 mmol/L (ref 96–108)
Creatinine: 0.71 mg/dL (ref 0.51–0.95)
GFR,Black: 111 *
GFR,Caucasian: 96 *
Glucose: 357 mg/dL — ABNORMAL HIGH (ref 60–99)
Lab: 29 mg/dL — ABNORMAL HIGH (ref 6–20)
Potassium: 4.6 mmol/L (ref 3.3–5.1)
Sodium: 136 mmol/L (ref 133–145)

## 2015-05-17 LAB — STEM CELL PRODUCTS
# of Bags: 1
CD34/Kg: 3.77
NBC/Kg: 5.48
Patient Weight: 118.6

## 2015-05-17 LAB — CD-34 STEM CELLS
CD34 #: 2105.9 cells/uL
CD34 %: 0.687 %

## 2015-05-17 LAB — RBC MORPHOLOGY

## 2015-05-17 LAB — TACROLIMUS LEVEL: Tacrolimus: 5.1 ng/mL

## 2015-05-17 LAB — MAGNESIUM: Magnesium: 1.6 mEq/L (ref 1.3–2.1)

## 2015-05-17 MED ORDER — LORAZEPAM 1 MG PO TABS *I*
1.0000 mg | ORAL_TABLET | Freq: Four times a day (QID) | ORAL | Status: DC | PRN
Start: 2015-05-17 — End: 2015-05-22
  Administered 2015-05-18 – 2015-05-21 (×3): 1 mg via ORAL
  Filled 2015-05-17 (×3): qty 1

## 2015-05-17 MED ORDER — LORAZEPAM 2 MG/ML IJ SOLN *I*
0.5000 mg | Freq: Four times a day (QID) | INTRAMUSCULAR | Status: DC | PRN
Start: 2015-05-17 — End: 2015-05-22
  Administered 2015-05-19 – 2015-05-20 (×4): 0.5 mg via INTRAVENOUS
  Filled 2015-05-17 (×4): qty 1

## 2015-05-17 MED ORDER — HYDROCORTISONE NA SUCCINATE PF 100 MG (50MG/ML) IJ SOLR *I*
100.0000 mg | Freq: Once | INTRAMUSCULAR | Status: AC
Start: 2015-05-17 — End: 2015-05-17
  Administered 2015-05-17: 100 mg via INTRAVENOUS
  Filled 2015-05-17: qty 2

## 2015-05-17 MED ORDER — ACETAMINOPHEN 325 MG PO TABS *I*
650.0000 mg | ORAL_TABLET | Freq: Once | ORAL | Status: AC
Start: 2015-05-17 — End: 2015-05-17
  Administered 2015-05-17: 650 mg via ORAL
  Filled 2015-05-17: qty 2

## 2015-05-17 MED ORDER — LORAZEPAM 2 MG/ML IJ SOLN *I*
0.5000 mg | Freq: Once | INTRAMUSCULAR | Status: AC
Start: 2015-05-17 — End: 2015-05-17
  Administered 2015-05-17: 0.5 mg via INTRAVENOUS
  Filled 2015-05-17: qty 1

## 2015-05-17 MED ORDER — DIPHENHYDRAMINE HCL 50 MG/ML IJ SOLN *I*
25.0000 mg | Freq: Once | INTRAMUSCULAR | Status: AC
Start: 2015-05-17 — End: 2015-05-17
  Administered 2015-05-17: 25 mg via INTRAVENOUS
  Filled 2015-05-17: qty 1

## 2015-05-17 NOTE — Progress Notes (Signed)
AVSS throughout shift, increased tacro rate for a level of 5.1 per order. Stem cells verified by two RN's, started infusion at 1330, tolerated, VS done per policy, infusion ended 1414. Will continue to monitor.

## 2015-05-17 NOTE — Interdisciplinary Rounds (Addendum)
Interdisciplinary Rounds Note    Date: 05/17/2015   Time: 12:47 PM   Attendance:  Attending, PA, Charge RN    Admit Date/Time:  05/11/2015  2:02 PM    Principal Problem: <principal problem not specified>  Problem List:   Patient Active Problem List    Diagnosis Date Noted    Myeloproliferative disease 05/11/2015    MPN (myeloproliferative neoplasm) 01/19/2014    Leukocytosis 09/22/2013       The patient's problem list and interdisciplinary care plan was reviewed.    Discharge Planning                 *Does patient currently have home care services?: No     *Current External Services: None                      Plan    05/14/15 Day -3.  Will receive Flu/Bu tonight.  Pt tolerating chemo well.  Continue supportive care.   5/7: doing well, continue supportive care  5/8:  Day -1.  Mild nausea, fatigue.  Continue supportive care.  05/17/2015 Day 0, WBC 3.3, ANC 3.2, Hct 23, plts 36, will receive MUD PBSCs today, continue supportive care.  05/18/2015 Day +1 WBC 3.9, ANC 3.7, hct 21, plts 52K, received MUD PBSCs yesterday, tolerated well, MTX given per protocol, continue supportive care.        Anticipated Discharge Date:     Discharge Disposition: TBD

## 2015-05-17 NOTE — Progress Notes (Addendum)
Blood & Marrow Transplant Program APP Progress Note    CC   Carly Higgins is a 55 y.o. 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 By [employer] testing, she has mutations of TET2, ASXL1, CCND2, GATA2, SRSF2.     Admitted for MUD allogeneic PBSC transplant.     Length of stay  LOS: 6 days     Interval History   C/o fatigue and intermittent heartburn.  Tacro level 5.1; increased 10%.  Due to receive cells this afternoon.    Review of Systems   A comprehensive review of 13 systems was performed; pertinent positives checked below:  []  fevers  []  fast pulse [x]  fatigue   []  chills []  chest pain []  dysuria    []  dry eyes []  nausea/emesis  []  rash   []  ear pain []  abdominal cramping []  skin changes   []  sores in mouth []  abdominal discomfort []  pain   []  sore throat []  loose stool, non-formed []  none of the above   []  cough  []  diarrhea, watery    []  sputum production  []  weight loss    []  shortness of breath []  insufficient oral intake      Vital Signs and I&O   Temp:  [36 C (96.8 F)-37.2 C (99 F)] 36.2 C (97.2 F)  Heart Rate:  [78-90] 79  Resp:  [18-22] 20  BP: (116-131)/(62-79) 122/62    Intake/Output Summary (Last 24 hours) at 05/17/15 1357  Last data filed at 05/17/15 1336   Gross per 24 hour   Intake          5732.98 ml   Output             2700 ml   Net          3032.98 ml        Physical Exam     Head:  Normocephalic, atraumatic   Eyes:  Conjunctiva/corneas clear   Throat: Oral mucosa pink & moist; no pharyngeal exudate   Lungs:   CTA B/L, effort unlabored    Heart:  RRR, no murmur/rub/gallop   Abdomen:   Soft, nontender, BS present    Extremities: No edema   Pulses: 2+ and symmetric   Skin: No rashes or lesions   Neurologic: A&O X 3, CMS intact   Central venous access: IVAD - WNL      Assessment & Plan   OBDULIA Higgins is a 55 yo female with unclassifiable myeloproliferative neoplasm.  By [employer] testing, she has mutations of TET2,  ASXL1, CCND2, GATA2, SRSF2. Admitted for MUD allogeneic PBSCT.     Atypical MPN  Completed Decitabine 78m/m2 IV daily x 5 days - Cycle # 1 12/15/14  On intermittent/dose adjusted hydroxyurea at admission - discontinued 5/5 in setting of falling WBC     Day -5 thru -2 (5/4-5/7):  Flu 456mm2, Bu 13023m2 - Busulfan dose adjusted to 285m42mr last 2 doses (from 244mg12may -1 (5/8): Rest day  Day 0 (5/9): MUD (10/10 HLA, female) allo PBSCT, ABO: A+/A+, CMV: neg/neg     Today is BMT day 0    Pancytopenia  Transfuse 1 unit RBCs for hematocrit <21% and/or transfuse 1 unit platelets for platelet count <11,000 OR transfuse 1 unit platelets for platelet count <20,000 if bleeding or fever.  Helmet for safety during periods of thrombocytopenia, per hospital policy    DVT ppx  Enoxaparin - hold for platelet count <30,000 or  bleeding    ID  At FIRST temperature spike >37.9   Order cultures  Obtain blood cultures (1 set of bactacs) from each central line port & one set of blood cultures drawn by venipuncture   Begin broad spectrum antibiotics with cefepime and flagyl (follow febrile neutropenia SOP)  Viral prophylaxis with acyclovir  Fungal prophylaxis with fluconazole    CV/pulm  Albuterol, Duonebs PRN wheezing     GVHD  Planned GVHD prophylaxis with tacrolimus and methotrexate    Tacro by continuous IV infusion beginning on day-1 via PIV  Target tacrolimus level 8-12    Methotrexate will dose equivalent leucovorin will be adminstered as follows:   15 mg/m2 on day +1 (>24 hrs from stem cell infusion)  10 mg/m2 on days +3, +6, and +11     Renal  Creatinine at admission was 0.7  Encouraged oral intake  Cont allopurinol     Electrolyte imbalance  Replace electrolytes as to the following parameters  Hypokalemia, supplement when serum K <3.6  Hypomagnesemia, supplement when serum Mag <1.5  Hypocalcemia, supplement when corrected calcium <8.9     At risk for malnutrition/hypoalbuminemia  Regular diet  Albumin level on  admission 4.3  Nutrition following     Nausea +/- emesis  Emetic prophylaxis with granisitron and dexamethasone until 48 hours after conditioning  PRN Phenergan, Ativan     Diarrhea  Send for C diff as clinically indicated  PRN Imodium if C diff negative     Stress ulcer ppx  Pantoprazole    VOD/SOS  Moderate risk with ablative BU/Flu  Ursodiol ppx    Mucositis  Moderate risk with ablative BU/Flu   Methotrexate may worsen in the short term  Cont good oral care, normal saline rinses  Provide BMX and pain medications as needed    At risk for deconditioning   Encouraged physical activity, ambulation  Consult PT if needed    Psychosocial  Cont emotional support     Smoking Cessation  Offered nicotine replacement & [place] [facility]ess[facility]; patient refusing at this time    Dispo planning  Anticipate discharge following engraftment and clinical stability  BMT consent signed  BMT attending [name]  Primary care physician [name]  RN coordinator Anne/Sharon  Lives in [place]    Patient seen and discussed with Dr. Herbert Pun, NP    Lab Data      Lab results: 05/17/15  0345 05/16/15  1324 05/15/15  0112 05/14/15  0106 05/13/15  0107 05/12/15  0527 05/11/15  1649   WBC 3.3* 7.6 19.5* 35.4* 44.3* 56.1* 43.7*   Hemoglobin 7.6* 8.5* 8.3* 7.9* 6.5* 7.2* 7.1*   Hematocrit 23* 26* 26* 25* 21* 23* 22*   RBC 2.6* 3.0* 2.9* 2.7* 2.2* 2.5* 2.4*   Platelets 36* 39* 47* 47* 54* 62* 59*   Neut # K/uL 3.2 7.0* 17.8* 29.0* 32.8* 38.7* 27.1*   Lymph # K/uL 0.0* 0.2* 0.4* 1.1* 2.2 4.5* 2.6   Mono # K/uL 0.0* 0.0* 0.0* 0.3 0.0* 1.4* 0.4   Eos # K/uL 0.0 0.0 0.0 0.0 0.4 0.0 0.0   Baso # K/uL 0.0 0.0 0.0 0.0 0.0 0.0 0.0   Seg Neut % 94.6 90.2 87.1 71.9 56.0 49.3 53.5   Lymphocyte % 0.9 1.8 1.7 2.6 5.3 7.6 6.1   Monocyte % 0.0 0.0 0.0 0.9 0.0 2.5 0.9   Eosinophil % 0.0 0.0 0.0 0.0 0.9 0.0 0.0   Basophil %  0.0 0.0 0.0 0.0 0.0 0.0 0.0   Bands % 1 2 4 10  18* 20* 8   Myelocyte % 2* 2* 2* 9* 4* 2* 12*    Metamyelocyte % 2* 4* 5* 4* 13* 17* 12*   Promyelocyte %  --   --   --  2* 1*  --  4*   Blasts %  --   --   --   --  2* 2* 3*               Lab results: 05/17/15  0345 05/16/15  0444 05/15/15  0112   Sodium 136 135 136   Potassium 4.6 4.5 4.6   Chloride 96 97 97   CO2 23 22 24    UN 29* 25* 23*   Creatinine 0.71 0.61 0.67   GFR,Caucasian 96 102 99   GFR,Black 111 118 114   Glucose 357* 243* 192*   Calcium 9.1 8.5* 8.8           Lab results: 05/17/15  0345   Magnesium 1.6       Lab Results   Component Value Date    ALT 28 05/16/2015    AST 33 05/16/2015     Lab Results   Component Value Date    ALK 56 05/16/2015     Lab Results   Component Value Date    TB 0.4 05/16/2015     LD   Date Value Ref Range Status   05/16/2015 851 (H) 118 - 225 U/L Final     LD, PL   Date Value Ref Range Status   05/09/2015 1146 (H) 118 - 225 U/L Final     LDL Calculated   Date Value Ref Range Status   08/27/2013 113 mg/dL Final     Comment:     REFERENCE RANGE:  < 100 Optimal                  100-129 Near or above optimal                  130-159 Borderline High                  160-189 High                    > 189 Very High

## 2015-05-17 NOTE — Plan of Care (Signed)
Comfort - BMT-related    • Nausea controlled or eliminated Maintaining    • Diarrhea controlled or eliminated Maintaining    • Without infection Maintaining        Graft Versus Host Disease (GVHD) Bundle    • Without rash Maintaining    • Stool output within parameters (</= 500ml in 24 h) Maintaining    • Total bilirubin </= 2 Maintaining        Mobility    • Patient's functional status is maintained or improved Maintaining        Nutrition    • Patient's nutritional status is maintained or improved Maintaining        Pain/Comfort    • Patient's pain or discomfort is manageable Maintaining        Psychosocial    • Demonstrates ability to cope with illness Maintaining        Safety    • Patient will remain free of falls Maintaining

## 2015-05-18 LAB — MAGNESIUM: Magnesium: 1.5 mEq/L (ref 1.3–2.1)

## 2015-05-18 LAB — COMPREHENSIVE METABOLIC PANEL
ALT: 35 U/L (ref 0–35)
AST: 18 U/L (ref 0–35)
Albumin: 3.8 g/dL (ref 3.5–5.2)
Alk Phos: 53 U/L (ref 35–105)
Anion Gap: 15 (ref 7–16)
Bilirubin,Total: 0.4 mg/dL (ref 0.0–1.2)
CO2: 22 mmol/L (ref 20–28)
Calcium: 9 mg/dL (ref 8.6–10.2)
Chloride: 95 mmol/L — ABNORMAL LOW (ref 96–108)
Creatinine: 0.7 mg/dL (ref 0.51–0.95)
GFR,Black: 113 *
GFR,Caucasian: 98 *
Glucose: 349 mg/dL — ABNORMAL HIGH (ref 60–99)
Lab: 29 mg/dL — ABNORMAL HIGH (ref 6–20)
Potassium: 4.9 mmol/L (ref 3.3–5.1)
Sodium: 132 mmol/L — ABNORMAL LOW (ref 133–145)
Total Protein: 6 g/dL — ABNORMAL LOW (ref 6.3–7.7)

## 2015-05-18 LAB — CBC AND DIFFERENTIAL
Baso # K/uL: 0 10*3/uL (ref 0.0–0.1)
Basophil %: 0.3 %
Eos # K/uL: 0 10*3/uL (ref 0.0–0.4)
Eosinophil %: 0 %
Hematocrit: 21 % — ABNORMAL LOW (ref 34–45)
Hemoglobin: 6.8 g/dL — ABNORMAL LOW (ref 11.2–15.7)
IMM Granulocytes #: 0.1 10*3/uL (ref 0.0–0.1)
IMM Granulocytes: 2.1 %
Lymph # K/uL: 0.1 10*3/uL — ABNORMAL LOW (ref 1.2–3.7)
Lymphocyte %: 2.6 %
MCH: 29 pg/cell (ref 26–32)
MCHC: 33 g/dL (ref 32–36)
MCV: 86 fL (ref 79–95)
Mono # K/uL: 0.1 10*3/uL — ABNORMAL LOW (ref 0.2–0.9)
Monocyte %: 1.3 %
Neut # K/uL: 3.7 10*3/uL (ref 1.6–6.1)
Nucl RBC # K/uL: 0 10*3/uL (ref 0.0–0.0)
Nucl RBC %: 0 /100 WBC (ref 0.0–0.2)
Platelets: 52 10*3/uL — ABNORMAL LOW (ref 160–370)
RBC: 2.4 MIL/uL — ABNORMAL LOW (ref 3.9–5.2)
RDW: 19.9 % — ABNORMAL HIGH (ref 11.7–14.4)
Seg Neut %: 93.7 %
WBC: 3.9 10*3/uL — ABNORMAL LOW (ref 4.0–10.0)

## 2015-05-18 LAB — PHOSPHORUS: Phosphorus: 3.9 mg/dL (ref 2.7–4.5)

## 2015-05-18 LAB — LACTATE DEHYDROGENASE: LD: 401 U/L — ABNORMAL HIGH (ref 118–225)

## 2015-05-18 LAB — BILIRUBIN, DIRECT: Bilirubin,Direct: <0.2 mg/dL (ref 0.0–0.3)

## 2015-05-18 LAB — TACROLIMUS LEVEL: Tacrolimus: 6.4 ng/mL

## 2015-05-18 NOTE — Progress Notes (Signed)
Comfort - BMT-related     Nausea controlled or eliminated Maintaining     Diarrhea controlled or eliminated Maintaining     Without infection Maintaining        Graft Versus Host Disease (GVHD) Bundle     Without rash Maintaining     Stool output within parameters (</= 535ml in 24 h) Maintaining     Total bilirubin </= 2 Maintaining        Mobility     Patient's functional status is maintained or improved Maintaining        Nutrition     Patient's nutritional status is maintained or improved Maintaining        Pain/Comfort     Patient's pain or discomfort is manageable Maintaining        Psychosocial     Demonstrates ability to cope with illness Maintaining        Safety     Patient will remain free of falls Maintaining          (1100-1900) Pt. AVSS on room air, A&O x3. Pt received PRN PO ativan x1 for nausea, with positive effect.  Fall prevention teaching reinforced with patient, patient verbalized understanding.  Will continue to monitor and provide handoff to oncoming nurse for continuity of care.      Carly Higgins, 55 y.o. female is here for chemotherapy infusion of: Methotrexate 30 Mg  Schema Day: + 1   Dose verified by 2 Rn's [x] YES [] NO  Pt identification verified by 2 Rn's  [x] YES [] NO  Treatment plan verified by 2 Rn's by provider note: [x] YES [] NO  Consent present in chart: [x] YES [] NO  IV device: [] TCVC[x] IVAD[] PICC[] PIV   RIGHT IVAD   IV therapy site and patency before infusion: subclavian right, condition patent and no redness  Blood return present before infusion: [x] YES [] NO  IV therapy site and patency during infusion: subclavian right, condition patent and no redness  Dose given: 30 MG given over 5 minutes  Blood return present after infusion: [x] YES [] NO  IV therapy site and patency after infusion: subclavian right, condition patent and no redness  Patient tolerated transfusion well    Patient and [x] YES [] NO family educated related to   [x] YES [] NOdrugs received   [x] YES [] NO  toxities

## 2015-05-18 NOTE — Progress Notes (Signed)
Patient AVSS throughout shift, patient receiving 1 unit PRBCs for a HCT of 21, premedicated with prescribed premeds, tolerating well, but will finish remaining amount on next shift. Consent present in chart, unit checked by two RNs at bedside.

## 2015-05-18 NOTE — Progress Notes (Signed)
Blood & Marrow Transplant Program APP Progress Note    CC   Carly Higgins is a 55 y.o. 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 By [employer] testing, she has mutations of TET2, ASXL1, CCND2, GATA2, SRSF2.     Admitted for MUD allogeneic PBSC transplant.     Length of stay  LOS: 7 days     Interval History   Feeling well this morning no complaints.  Plan for full dose MTX. Blood glucose elevated to 349 with AM labs, monitor, finishing steroids today. Tacro level 6.4, increased dose 20%.     Review of Systems   A comprehensive review of 13 systems was performed; pertinent positives checked below:  []  fevers  []  fast pulse []  fatigue   []  chills []  chest pain []  dysuria    []  dry eyes []  nausea/emesis  []  rash   []  ear pain []  abdominal cramping []  skin changes   []  sores in mouth []  abdominal discomfort []  pain   []  sore throat []  loose stool, non-formed [x]  none of the above   []  cough  []  diarrhea, watery    []  sputum production  []  weight loss    []  shortness of breath []  insufficient oral intake      Vital Signs and I&O   Temp:  [35.8 C (96.4 F)-36.8 C (98.2 F)] 36 C (96.8 F)  Heart Rate:  [73-87] 79  Resp:  [16-22] 18  BP: (111-147)/(57-93) 134/79    Intake/Output Summary (Last 24 hours) at 05/18/15 1210  Last data filed at 05/18/15 1206   Gross per 24 hour   Intake          4484.34 ml   Output             2450 ml   Net          2034.34 ml        Physical Exam     Head:  Normocephalic, atraumatic   Eyes:  Conjunctiva/corneas clear   Throat: Oral mucosa pink & moist; no pharyngeal exudate   Lungs:   CTA B/L, effort unlabored    Heart:  RRR, no murmur/rub/gallop   Abdomen:   Soft, nontender, BS present    Extremities: No edema   Pulses: 2+ and symmetric   Skin: No rashes or lesions   Neurologic: A&O X 3, CMS intact   Central venous access: IVAD - WNL      Assessment & Plan   Carly Higgins is a 55 yo female with unclassifiable  myeloproliferative neoplasm.  By [employer] testing, she has mutations of TET2, ASXL1, CCND2, GATA2, SRSF2. Admitted for MUD allogeneic PBSCT.     Atypical MPN  Completed Decitabine 50m/m2 IV daily x 5 days - Cycle # 1 12/15/14  On intermittent/dose adjusted hydroxyurea at admission - discontinued 5/5 in setting of falling WBC     Day -5 thru -2 (5/4-5/7):  Flu 420mm2, Bu 13078m2 - Busulfan dose adjusted to 285m89mr last 2 doses (from 244mg52may -1 (5/8): Rest day  Day 0 (5/9): MUD (10/10 HLA, female) allo PBSCT, ABO: A+/A+, CMV: neg/neg     Today is BMT day +1    Pancytopenia  Transfuse 1 unit RBCs for hematocrit <21% and/or transfuse 1 unit platelets for platelet count <11,000 OR transfuse 1 unit platelets for platelet count <20,000 if bleeding or fever.  Helmet for safety during periods of thrombocytopenia, per hospital policy  DVT ppx  Enoxaparin - hold for platelet count <30,000 or bleeding    ID  At FIRST temperature spike >37.9   Order cultures  Obtain blood cultures (1 set of bactacs) from each central line port & one set of blood cultures drawn by venipuncture   Begin broad spectrum antibiotics with cefepime and flagyl (follow febrile neutropenia SOP)  Viral prophylaxis with acyclovir  Fungal prophylaxis with fluconazole    CV/pulm  Albuterol, Duonebs PRN wheezing     GVHD  Planned GVHD prophylaxis with tacrolimus and methotrexate    Tacro by continuous IV infusion beginning on day-1 via PIV  Target tacrolimus level 8-12    Methotrexate will dose equivalent leucovorin will be adminstered as follows:   15 mg/m2 on day +1 (>24 hrs from stem cell infusion) (full dose given)  10 mg/m2 on days +3, +6, and +11     Renal  Creatinine at admission was 0.7, WNL today  Encouraged oral intake       Electrolyte imbalance  Replace electrolytes as to the following parameters  Hypokalemia, supplement when serum K <3.6  Hypomagnesemia, supplement when serum Mag <1.5  Hypocalcemia, supplement when  corrected calcium <8.9     At risk for malnutrition/hypoalbuminemia  Regular diet  Albumin level on admission 4.3  Nutrition following     Nausea +/- emesis  Emetic prophylaxis with granisitron and dexamethasone until 48 hours after conditioning  PRN Phenergan, Ativan     Diarrhea  Send for C diff as clinically indicated  PRN Imodium if C diff negative     Stress ulcer ppx  Pantoprazole    VOD/SOS  Moderate risk with ablative BU/Flu  Ursodiol ppx    Mucositis  Moderate risk with ablative BU/Flu   Methotrexate may worsen in the short term  Cont good oral care, normal saline rinses  Provide BMX and pain medications as needed    At risk for deconditioning   Encouraged physical activity, ambulation  Consult PT if needed    Psychosocial  Cont emotional support     Smoking Cessation  Offered nicotine replacement &  [place] [facility]ess[facility]; patient refusing at this time    Dispo planning  Anticipate discharge following engraftment and clinical stability  BMT consent signed  BMT attending [name]  Primary care physician [name]  RN coordinator Anne/Sharon  Lives in [place]    Patient seen and discussed with Dr. Miguel Rota, NP    Lab Data      Lab results: 05/18/15  8527 05/17/15  0345 05/16/15  7824 05/15/15  0112 05/14/15  0106 05/13/15  0107 05/12/15  0527 05/11/15  1649   WBC 3.9* 3.3* 7.6 19.5* 35.4* 44.3* 56.1* 43.7*   Hemoglobin 6.8* 7.6* 8.5* 8.3* 7.9* 6.5* 7.2* 7.1*   Hematocrit 21* 23* 26* 26* 25* 21* 23* 22*   RBC 2.4* 2.6* 3.0* 2.9* 2.7* 2.2* 2.5* 2.4*   Platelets 52* 36* 39* 47* 47* 54* 62* 59*   Neut # K/uL 3.7 3.2 7.0* 17.8* 29.0* 32.8* 38.7* 27.1*   Lymph # K/uL 0.1* 0.0* 0.2* 0.4* 1.1* 2.2 4.5* 2.6   Mono # K/uL 0.1* 0.0* 0.0* 0.0* 0.3 0.0* 1.4* 0.4   Eos # K/uL 0.0 0.0 0.0 0.0 0.0 0.4 0.0 0.0   Baso # K/uL 0.0 0.0 0.0 0.0 0.0 0.0 0.0 0.0   Seg Neut % 93.7 94.6 90.2 87.1 71.9 56.0 49.3 53.5   Lymphocyte % 2.6 0.9 1.8  1.7 2.6 5.3 7.6 6.1   Monocyte % 1.3 0.0 0.0  0.0 0.9 0.0 2.5 0.9   Eosinophil % 0.0 0.0 0.0 0.0 0.0 0.9 0.0 0.0   Basophil % 0.3 0.0 0.0 0.0 0.0 0.0 0.0 0.0   Bands %  --  1 2 4 10  18* 20* 8   Myelocyte %  --  2* 2* 2* 9* 4* 2* 12*   Metamyelocyte %  --  2* 4* 5* 4* 13* 17* 12*   Promyelocyte %  --   --   --   --  2* 1*  --  4*   Blasts %  --   --   --   --   --  2* 2* 3*               Lab results: 05/18/15  0252 05/17/15  0345 05/16/15  0444   Sodium 132* 136 135   Potassium 4.9 4.6 4.5   Chloride 95* 96 97   CO2 22 23 22    UN 29* 29* 25*   Creatinine 0.70 0.71 0.61   GFR,Caucasian 98 96 102   GFR,Black 113 111 118   Glucose 349* 357* 243*   Calcium 9.0 9.1 8.5*           Lab results: 05/18/15  0252   Magnesium 1.5       Lab Results   Component Value Date    ALT 35 05/18/2015    AST 18 05/18/2015     Lab Results   Component Value Date    ALK 53 05/18/2015     Lab Results   Component Value Date    TB 0.4 05/18/2015     LD   Date Value Ref Range Status   05/18/2015 401 (H) 118 - 225 U/L Final     LD, PL   Date Value Ref Range Status   05/09/2015 1146 (H) 118 - 225 U/L Final     LDL Calculated   Date Value Ref Range Status   08/27/2013 113 mg/dL Final     Comment:     REFERENCE RANGE:  < 100 Optimal                  100-129 Near or above optimal                  130-159 Borderline High                  160-189 High                    > 189 Very High

## 2015-05-19 LAB — CBC AND DIFFERENTIAL
Baso # K/uL: 0 10*3/uL (ref 0.0–0.1)
Basophil %: 0 %
Eos # K/uL: 0 10*3/uL (ref 0.0–0.4)
Eosinophil %: 0 %
Hematocrit: 22 % — ABNORMAL LOW (ref 34–45)
Hemoglobin: 7.5 g/dL — ABNORMAL LOW (ref 11.2–15.7)
Lymph # K/uL: 0.1 10*3/uL — ABNORMAL LOW (ref 1.2–3.7)
Lymphocyte %: 4.4 %
MCH: 29 pg/cell (ref 26–32)
MCHC: 34 g/dL (ref 32–36)
MCV: 84 fL (ref 79–95)
Mono # K/uL: 0 10*3/uL — ABNORMAL LOW (ref 0.2–0.9)
Monocyte %: 0 %
Neut # K/uL: 2.6 10*3/uL (ref 1.6–6.1)
Nucl RBC # K/uL: 0 10*3/uL (ref 0.0–0.0)
Nucl RBC %: 0 /100 WBC (ref 0.0–0.2)
Platelets: 37 10*3/uL — ABNORMAL LOW (ref 160–370)
RBC: 2.6 MIL/uL — ABNORMAL LOW (ref 3.9–5.2)
RDW: 19.3 % — ABNORMAL HIGH (ref 11.7–14.4)
Seg Neut %: 94.7 %
WBC: 2.7 10*3/uL — ABNORMAL LOW (ref 4.0–10.0)

## 2015-05-19 LAB — BASIC METABOLIC PANEL
Anion Gap: 15 (ref 7–16)
CO2: 21 mmol/L (ref 20–28)
Calcium: 8.5 mg/dL — ABNORMAL LOW (ref 8.6–10.2)
Chloride: 99 mmol/L (ref 96–108)
Creatinine: 0.76 mg/dL (ref 0.51–0.95)
GFR,Black: 102 *
GFR,Caucasian: 89 *
Glucose: 249 mg/dL — ABNORMAL HIGH (ref 60–99)
Lab: 28 mg/dL — ABNORMAL HIGH (ref 6–20)
Potassium: 5 mmol/L (ref 3.3–5.1)
Sodium: 135 mmol/L (ref 133–145)

## 2015-05-19 LAB — RED BLOOD CELLS
Coded Blood type: 6200
Component blood type: A POS
Dispense status: TRANSFUSED

## 2015-05-19 LAB — MAGNESIUM: Magnesium: 1.5 mEq/L (ref 1.3–2.1)

## 2015-05-19 LAB — DIFF MANUAL
Bands %: 1 % (ref 0–10)
Diff Based On: 113 CELLS

## 2015-05-19 LAB — TACROLIMUS LEVEL: Tacrolimus: 10.4 ng/mL

## 2015-05-19 LAB — POCT GLUCOSE
Glucose POCT: 245 mg/dL — ABNORMAL HIGH (ref 60–99)
Glucose POCT: 141 mg/dL — ABNORMAL HIGH (ref 60–99)
Glucose POCT: 180 mg/dL — ABNORMAL HIGH (ref 60–99)

## 2015-05-19 MED ORDER — SUCRALFATE 1 GM/10ML PO SUSP *I*
2.0000 g | Freq: Four times a day (QID) | ORAL | Status: DC | PRN
Start: 2015-05-19 — End: 2015-06-07
  Administered 2015-05-19 – 2015-05-24 (×10): 2 g via ORAL
  Filled 2015-05-19 (×42): qty 20

## 2015-05-19 MED ORDER — INSULIN LISPRO (HUMAN) 100 UNIT/ML IJ/SC SOLN *WRAPPED*
0.0000 [IU] | Freq: Three times a day (TID) | SUBCUTANEOUS | Status: DC
Start: 2015-05-19 — End: 2015-05-26
  Administered 2015-05-19 – 2015-05-20 (×2): 3 [IU] via SUBCUTANEOUS
  Administered 2015-05-20 – 2015-05-21 (×2): 2 [IU] via SUBCUTANEOUS
  Administered 2015-05-21: 3 [IU] via SUBCUTANEOUS
  Administered 2015-05-21 – 2015-05-22 (×3): 2 [IU] via SUBCUTANEOUS
  Administered 2015-05-22: 1 [IU] via SUBCUTANEOUS
  Administered 2015-05-23: 2 [IU] via SUBCUTANEOUS
  Administered 2015-05-23 – 2015-05-24 (×4): 1 [IU] via SUBCUTANEOUS

## 2015-05-19 MED ORDER — GLUCOSE 40 % PO GEL *I*
15.0000 g | ORAL | Status: DC | PRN
Start: 2015-05-19 — End: 2015-05-26

## 2015-05-19 MED ORDER — GLUCAGON HCL (RDNA) 1 MG IJ SOLR *WRAPPED*
1.0000 mg | INTRAMUSCULAR | Status: DC | PRN
Start: 2015-05-19 — End: 2015-05-26

## 2015-05-19 MED ORDER — CALCIUM CARBONATE ANTACID 500 MG PO CHEW *I*
1000.0000 mg | CHEWABLE_TABLET | Freq: Three times a day (TID) | ORAL | Status: DC | PRN
Start: 2015-05-19 — End: 2015-06-02
  Administered 2015-05-19 – 2015-05-24 (×9): 1000 mg via ORAL
  Filled 2015-05-19 (×9): qty 2

## 2015-05-19 MED ORDER — SIMETHICONE 80 MG PO CHEW *I*
80.0000 mg | CHEWABLE_TABLET | Freq: Four times a day (QID) | ORAL | Status: DC | PRN
Start: 2015-05-19 — End: 2015-05-28
  Administered 2015-05-19 – 2015-05-28 (×5): 80 mg via ORAL
  Filled 2015-05-19 (×5): qty 1

## 2015-05-19 MED ORDER — PANTOPRAZOLE SODIUM 40 MG PO TBEC *I*
40.0000 mg | DELAYED_RELEASE_TABLET | Freq: Two times a day (BID) | ORAL | Status: DC
Start: 2015-05-20 — End: 2015-05-26
  Administered 2015-05-20 – 2015-05-25 (×12): 40 mg via ORAL
  Filled 2015-05-19 (×13): qty 1

## 2015-05-19 MED ORDER — DEXTROSE 50 % IV SOLN *I*
25.0000 g | INTRAVENOUS | Status: DC | PRN
Start: 2015-05-19 — End: 2015-05-26

## 2015-05-19 NOTE — Progress Notes (Signed)
Assumed care at 0700, complaints of nausea thoughout the shift, emesis x1, see doc flowsheets for details. PRN PO phenergan given x1 for nausea with positive effect. PRN IV phenergan x1 for nausea with moderate effect. PRN IV ativan x1 given for nausea with moderate effect. PRN PO simethicone given x2 for gas with positive effect. Teaching done regarding bland food choices for dinner. Complaints of excessive heartburn, provider notified, PRN PO carafate given with positive effect. Will continue to monitor.

## 2015-05-19 NOTE — Progress Notes (Signed)
Blood & Marrow Transplant Program APP Progress Note    CC   Carly Higgins is a 55 y.o. 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 By [employer] testing, she has mutations of TET2, ASXL1, CCND2, GATA2, SRSF2.     Admitted for MUD allogeneic PBSC transplant.     Length of stay  LOS: 8 days     Interval History   Feeling well this morning no complaints. MTX started, BGs have been uptrending, finished steroids now. [place] level 10.4 today.     Review of Systems   A comprehensive review of 13 systems was performed; pertinent positives checked below:  []  fevers  []  fast pulse [x]  fatigue   []  chills []  chest pain []  dysuria    []  dry eyes []  nausea/emesis  []  rash   []  ear pain []  abdominal cramping []  skin changes   []  sores in mouth [x]  abdominal discomfort []  pain   []  sore throat []  loose stool, non-formed []  none of the above   []  cough  []  diarrhea, watery    []  sputum production  []  weight loss    []  shortness of breath []  insufficient oral intake      Vital Signs and I&O   Temp:  [36 C (96.8 F)-36.6 C (97.9 F)] 36.4 C (97.5 F)  Heart Rate:  [74-88] 88  Resp:  [16-18] 16  BP: (108-140)/(60-88) 140/88    Intake/Output Summary (Last 24 hours) at 05/19/15 1023  Last data filed at 05/19/15 0859   Gross per 24 hour   Intake          1477.31 ml   Output              650 ml   Net           827.31 ml        Physical Exam     Head:  Normocephalic, atraumatic   Eyes:  Conjunctiva/corneas clear   Throat: Oral mucosa pink & moist; mild irritation, no exudates   Lungs:   CTA B/L, effort unlabored    Heart:  RRR, no murmur/rub/gallop   Abdomen:   Soft, nontender, BS present    Extremities: No edema   Pulses: 2+ and symmetric   Skin: No rashes or lesions   Neurologic: A&O X 3, CMS intact   Central venous access: IVAD - WNL      Assessment & Plan   Carly Higgins is a 55 yo female with unclassifiable myeloproliferative neoplasm.  By [employer] testing,  she has mutations of TET2, ASXL1, CCND2, GATA2, SRSF2. Admitted for MUD allogeneic PBSCT.     Atypical MPN  Completed Decitabine 63m/m2 IV daily x 5 days - Cycle # 1 12/15/14  On intermittent/dose adjusted hydroxyurea at admission - discontinued 5/5 in setting of falling WBC     Day -5 thru -2 (5/4-5/7):  Flu 482mm2, Bu 13045m2 - Busulfan dose adjusted to 285m69mr last 2 doses (from 244mg61may -1 (5/8): Rest day  Day 0 (5/9): MUD (10/10 HLA, female) allo PBSCT, ABO: A+/A+, CMV: neg/neg     Today is BMT day +2    Pancytopenia  Transfuse 1 unit RBCs for hematocrit <21% and/or transfuse 1 unit platelets for platelet count <11,000 OR transfuse 1 unit platelets for platelet count <20,000 if bleeding or fever.  Helmet for safety during periods of thrombocytopenia, per hospital policy    DVT ppx  Enoxaparin - hold  for platelet count <30,000 or bleeding    ID  At FIRST temperature spike >37.9   Order cultures  Obtain blood cultures (1 set of bactacs) from each central line port & one set of blood cultures drawn by venipuncture   Begin broad spectrum antibiotics with cefepime and flagyl (follow febrile neutropenia SOP)  Viral prophylaxis with acyclovir  Fungal prophylaxis with fluconazole    CV/pulm  Albuterol, Duonebs PRN wheezing     Endo  Lispro SS added for rising BGs, likely due to steroids, can stop when normalized    GVHD  Planned GVHD prophylaxis with tacrolimus and methotrexate    Tacro by continuous IV infusion beginning on day-1 via PIV  Target tacrolimus level 8-12    Methotrexate will dose equivalent leucovorin will be adminstered as follows:   15 mg/m2 on day +1 (>24 hrs from stem cell infusion) (full dose given)  10 mg/m2 on days +3, +6, and +11     Renal  Creatinine at admission was 0.7, WNL today  Encouraged oral intake       Electrolyte imbalance  Replace electrolytes as to the following parameters  Hypokalemia, supplement when serum K <3.6  Hypomagnesemia, supplement when serum Mag  <1.5  Hypocalcemia, supplement when corrected calcium <8.9     At risk for malnutrition/hypoalbuminemia  Regular diet  Albumin level on admission 4.3  Nutrition following     Nausea +/- emesis  Emetic prophylaxis with granisitron and dexamethasone until 48 hours after conditioning  PRN Phenergan, Ativan   Simethicone added for bloating/cramping    Diarrhea  Send for C diff as clinically indicated  PRN Imodium if C diff negative     Stress ulcer ppx  Pantoprazole    VOD/SOS  Moderate risk with ablative BU/Flu  Ursodiol ppx    Mucositis  Moderate risk with ablative BU/Flu   Methotrexate may worsen in the short term  Cont good oral care, normal saline rinses  Provide BMX and pain medications as needed    At risk for deconditioning   Encouraged physical activity, ambulation  Consult PT if needed    Psychosocial  Cont emotional support     Smoking Cessation  Offered nicotine replacement & [place] [facility]oking Cessation Program; patient refusing at this time    Dispo planning  Anticipate discharge following engraftment and clinical stability  BMT consent signed  BMT attending [name]  Primary care physician [name]  RN coordinator Anne/Sharon  Lives in [facility]    Patient seen and discussed with Dr. Maryjean Ka, MD    Lab Data      Lab results: 05/19/15  0225 05/18/15  6295 05/17/15  0345 05/16/15  2841 05/15/15  0112 05/14/15  0106 05/13/15  0107 05/12/15  0527 05/11/15  1649   WBC 2.7* 3.9* 3.3* 7.6 19.5* 35.4* 44.3* 56.1* 43.7*   Hemoglobin 7.5* 6.8* 7.6* 8.5* 8.3* 7.9* 6.5* 7.2* 7.1*   Hematocrit 22* 21* 23* 26* 26* 25* 21* 23* 22*   RBC 2.6* 2.4* 2.6* 3.0* 2.9* 2.7* 2.2* 2.5* 2.4*   Platelets 37* 52* 36* 39* 47* 47* 54* 62* 59*   Neut # K/uL 2.6 3.7 3.2 7.0* 17.8* 29.0* 32.8* 38.7* 27.1*   Lymph # K/uL 0.1* 0.1* 0.0* 0.2* 0.4* 1.1* 2.2 4.5* 2.6   Mono # K/uL 0.0* 0.1* 0.0* 0.0* 0.0* 0.3 0.0* 1.4* 0.4   Eos # K/uL 0.0 0.0 0.0 0.0 0.0 0.0 0.4 0.0 0.0   Baso #  K/uL 0.0 0.0 0.0 0.0 0.0 0.0 0.0  0.0 0.0   Seg Neut % 94.7 93.7 94.6 90.2 87.1 71.9 56.0 49.3 53.5   Lymphocyte % 4.4 2.6 0.9 1.8 1.7 2.6 5.3 7.6 6.1   Monocyte % 0.0 1.3 0.0 0.0 0.0 0.9 0.0 2.5 0.9   Eosinophil % 0.0 0.0 0.0 0.0 0.0 0.0 0.9 0.0 0.0   Basophil % 0.0 0.3 0.0 0.0 0.0 0.0 0.0 0.0 0.0   Bands % 1  --  1 2 4 10  18* 20* 8   Myelocyte %  --   --  2* 2* 2* 9* 4* 2* 12*   Metamyelocyte %  --   --  2* 4* 5* 4* 13* 17* 12*   Promyelocyte %  --   --   --   --   --  2* 1*  --  4*   Blasts %  --   --   --   --   --   --  2* 2* 3*               Lab results: 05/19/15  0225 05/18/15  0252 05/17/15  0345   Sodium 135 132* 136   Potassium 5.0 4.9 4.6   Chloride 99 95* 96   CO2 21 22 23    UN 28* 29* 29*   Creatinine 0.76 0.70 0.71   GFR,Caucasian 89 98 96   GFR,Black 102 113 111   Glucose 249* 349* 357*   Calcium 8.5* 9.0 9.1           Lab results: 05/19/15  0225   Magnesium 1.5       Lab Results   Component Value Date    ALT 35 05/18/2015    AST 18 05/18/2015     Lab Results   Component Value Date    ALK 53 05/18/2015     Lab Results   Component Value Date    TB 0.4 05/18/2015     LD   Date Value Ref Range Status   05/18/2015 401 (H) 118 - 225 U/L Final     LD, PL   Date Value Ref Range Status   05/09/2015 1146 (H) 118 - 225 U/L Final     LDL Calculated   Date Value Ref Range Status   08/27/2013 113 mg/dL Final     Comment:     REFERENCE RANGE:  < 100 Optimal                  100-129 Near or above optimal                  130-159 Borderline High                  160-189 High                    > 189 Very High

## 2015-05-19 NOTE — Progress Notes (Signed)
Utilization Management    Level of care inpatient as of the date 05/11/2015    Dolora Ridgely, RN    Pager: [phone]

## 2015-05-19 NOTE — Interdisciplinary Rounds (Signed)
Interdisciplinary Rounds Note    Date: 05/19/2015   Time: 9:05 AM   Attendance:  Attending, PA, Charge RN    Admit Date/Time:  05/11/2015  2:02 PM    Principal Problem: <principal problem not specified>  Problem List:   Patient Active Problem List    Diagnosis Date Noted    Myeloproliferative disease 05/11/2015    MPN (myeloproliferative neoplasm) 01/19/2014    Leukocytosis 09/22/2013       The patient's problem list and interdisciplinary care plan was reviewed.    Discharge Planning                 *Does patient currently have home care services?: No     *Current External Services: None                      Plan    05/14/15 Day -3.  Will receive Flu/Bu tonight.  Pt tolerating chemo well.  Continue supportive care.   5/7: doing well, continue supportive care  5/8:  Day -1.  Mild nausea, fatigue.  Continue supportive care.  05/17/2015 Day 0, WBC 3.3, ANC 3.2, Hct 23, plts 36, will receive MUD PBSCs today, continue supportive care.  05/18/2015 Day +1 WBC 3.9, ANC 3.7, hct 21, plts 52K, received MUD PBSCs yesterday, tolerated well, MTX given per protocol, continue supportive care.  05/19/15:  Day +2.  Counts dropping with ANC 2600 today.  Glucose control with BG's and SS insulin coverage d/t steroids.  Continue supportive care.      Anticipated Discharge Date:     Discharge Disposition: TBD

## 2015-05-20 LAB — COMPREHENSIVE METABOLIC PANEL
ALT: 38 U/L — ABNORMAL HIGH (ref 0–35)
AST: 25 U/L (ref 0–35)
Albumin: 3.9 g/dL (ref 3.5–5.2)
Alk Phos: 45 U/L (ref 35–105)
Anion Gap: 15 (ref 7–16)
Bilirubin,Total: 0.7 mg/dL (ref 0.0–1.2)
CO2: 22 mmol/L (ref 20–28)
Calcium: 8.7 mg/dL (ref 8.6–10.2)
Chloride: 97 mmol/L (ref 96–108)
Creatinine: 0.69 mg/dL (ref 0.51–0.95)
GFR,Black: 113 *
GFR,Caucasian: 98 *
Glucose: 151 mg/dL — ABNORMAL HIGH (ref 60–99)
Lab: 27 mg/dL — ABNORMAL HIGH (ref 6–20)
Potassium: 5 mmol/L (ref 3.3–5.1)
Sodium: 134 mmol/L (ref 133–145)
Total Protein: 6 g/dL — ABNORMAL LOW (ref 6.3–7.7)

## 2015-05-20 LAB — CBC AND DIFFERENTIAL
Baso # K/uL: 0 10*3/uL (ref 0.0–0.1)
Basophil %: 0 %
Eos # K/uL: 0 10*3/uL (ref 0.0–0.4)
Eosinophil %: 0.4 %
Hematocrit: 23 % — ABNORMAL LOW (ref 34–45)
Hemoglobin: 7.7 g/dL — ABNORMAL LOW (ref 11.2–15.7)
IMM Granulocytes #: 0.1 10*3/uL (ref 0.0–0.1)
IMM Granulocytes: 3.3 %
Lymph # K/uL: 0.2 10*3/uL — ABNORMAL LOW (ref 1.2–3.7)
Lymphocyte %: 6.1 %
MCH: 28 pg/cell (ref 26–32)
MCHC: 34 g/dL (ref 32–36)
MCV: 84 fL (ref 79–95)
Mono # K/uL: 0 10*3/uL — ABNORMAL LOW (ref 0.2–0.9)
Monocyte %: 1.2 %
Neut # K/uL: 2.2 10*3/uL (ref 1.6–6.1)
Nucl RBC # K/uL: 0 10*3/uL (ref 0.0–0.0)
Nucl RBC %: 0 /100 WBC (ref 0.0–0.2)
Platelets: 27 10*3/uL — ABNORMAL LOW (ref 160–370)
RBC: 2.7 MIL/uL — ABNORMAL LOW (ref 3.9–5.2)
RDW: 19.1 % — ABNORMAL HIGH (ref 11.7–14.4)
Seg Neut %: 89 %
WBC: 2.5 10*3/uL — ABNORMAL LOW (ref 4.0–10.0)

## 2015-05-20 LAB — BILIRUBIN, DIRECT: Bilirubin,Direct: 0.2 mg/dL (ref 0.0–0.3)

## 2015-05-20 LAB — POCT GLUCOSE
Glucose POCT: 182 mg/dL — ABNORMAL HIGH (ref 60–99)
Glucose POCT: 222 mg/dL — ABNORMAL HIGH (ref 60–99)
Glucose POCT: 221 mg/dL — ABNORMAL HIGH (ref 60–99)
Glucose POCT: 232 mg/dL — ABNORMAL HIGH (ref 60–99)

## 2015-05-20 LAB — LACTATE DEHYDROGENASE: LD: 271 U/L — ABNORMAL HIGH (ref 118–225)

## 2015-05-20 LAB — PHOSPHORUS: Phosphorus: 4.9 mg/dL — ABNORMAL HIGH (ref 2.7–4.5)

## 2015-05-20 LAB — MAGNESIUM: Magnesium: 1.6 mEq/L (ref 1.3–2.1)

## 2015-05-20 LAB — TACROLIMUS LEVEL: Tacrolimus: 10.9 ng/mL

## 2015-05-20 MED ORDER — ONDANSETRON HCL 2 MG/ML IV SOLN *I*
4.0000 mg | Freq: Four times a day (QID) | INTRAMUSCULAR | Status: DC
Start: 2015-05-20 — End: 2015-05-22
  Administered 2015-05-20 – 2015-05-22 (×9): 4 mg via INTRAVENOUS
  Filled 2015-05-20 (×9): qty 2

## 2015-05-20 NOTE — Progress Notes (Signed)
Blood & Marrow Transplant Program APP Progress Note    CC   Carly Higgins is a 55 y.o. 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 By [employer] testing, she has mutations of TET2, ASXL1, CCND2, GATA2, SRSF2.     Admitted for MUD allogeneic PBSC transplant.     Length of stay  LOS: 9 days     Interval History   Feels ok this morning, still nauseous, was not able to keep breakfast down. GERD has also been bothering her more, and overall with malaise.     Otherwise remains afebrile, urinating and stooling well. No respiratory changes. Some mild irritation in back of throat.     Review of Systems   A comprehensive review of 13 systems was performed; pertinent positives checked below:  [] fevers  [] fast pulse [x] fatigue   [] chills [] chest pain [] dysuria    [] dry eyes [x] nausea/emesis  [] rash   [] ear pain [] abdominal cramping [] skin changes   [] sores in mouth [x] abdominal discomfort [] pain   [] sore throat [] loose stool, non-formed [] none of the above   [] cough  [] diarrhea, watery    [] sputum production  [] weight loss    [] shortness of breath [] insufficient oral intake      Vital Signs and I&O   Temp:  [36.1 C (97 F)-36.6 C (97.9 F)] 36.5 C (97.7 F)  Heart Rate:  [81-98] 93  Resp:  [16-20] 20  BP: (121-150)/(72-89) 150/89    Intake/Output Summary (Last 24 hours) at 05/20/15 1100  Last data filed at 05/20/15 0809   Gross per 24 hour   Intake          1015.53 ml   Output             2901 ml   Net         -1885.47 ml        Physical Exam     Head:  Normocephalic, atraumatic   Eyes:  Conjunctiva/corneas clear   Throat: Oral mucosa pink & moist; mild irritation in posterior OP, no exudates   Lungs:   CTA B/L, effort unlabored    Heart:  RRR, no murmur/rub/gallop   Abdomen:   Soft, nontender, BS present    Extremities: No edema   Pulses: 2+ and symmetric   Skin: No rashes or lesions   Neurologic: A&O X 3, CMS intact   Central venous  access: IVAD - WNL      Assessment & Plan   Carly Higgins is a 55 yo female with unclassifiable myeloproliferative neoplasm.  By [employer] testing, she has mutations of TET2, ASXL1, CCND2, GATA2, SRSF2. Admitted for MUD allogeneic PBSCT.     Atypical MPN  Completed Decitabine 65m/m2 IV daily x 5 days - Cycle # 1 12/15/14  On intermittent/dose adjusted hydroxyurea at admission - discontinued 5/5 in setting of falling WBC     Day -5 thru -2 (5/4-5/7):  Flu 459mm2, Bu 1304m2 - Busulfan dose adjusted to 285m54mr last 2 doses (from 244mg30may -1 (5/8): Rest day  Day 0 (5/9): MUD (10/10 HLA, female) allo PBSCT, ABO: A+/A+, CMV: neg/neg     Today is BMT day +3    Pancytopenia  Transfuse 1 unit RBCs for hematocrit <21% and/or transfuse 1 unit platelets for platelet count <11,000 OR transfuse 1 unit platelets for platelet  count <20,000 if bleeding or fever.  Helmet for safety during periods of thrombocytopenia, per hospital policy    DVT ppx  Enoxaparin - hold for platelet count <30,000 or bleeding    ID  At FIRST temperature spike >37.9   Order cultures  Obtain blood cultures (1 set of bactacs) from each central line port & one set of blood cultures drawn by venipuncture   Begin broad spectrum antibiotics with cefepime and flagyl (follow febrile neutropenia SOP)  Viral prophylaxis with acyclovir  Fungal prophylaxis with fluconazole    CV/pulm  Albuterol, Duonebs PRN wheezing     Endo  Lispro SS added for rising BGs, likely due to steroids, can stop when normalized    GVHD  Planned GVHD prophylaxis with tacrolimus and methotrexate  Tacro by continuous IV infusion beginning on day-1 via PIV  Target tacrolimus level 8-12    Methotrexate will dose equivalent leucovorin will be adminstered as follows:   15 mg/m2 on day +1 (>24 hrs from stem cell infusion) (full dose given)  10 mg/m2 on days +3, +6, and +11     Renal  Creatinine at admission was 0.7, WNL today  Continue oral intake     Electrolyte  imbalance  Replace electrolytes as to the following parameters  Hypokalemia, supplement when serum K <3.6  Hypomagnesemia, supplement when serum Mag <1.5  Hypocalcemia, supplement when corrected calcium <8.9     At risk for malnutrition/hypoalbuminemia  Regular diet  Albumin level on admission 4.3  Nutrition following     GI  PRN Phenergan, Ativan, scheduled zofran   Simethicone added for bloating/cramping  Protonix daily for GERD and stress ulcer ppx    VOD/SOS  Moderate risk with ablative BU/Flu  Ursodiol ppx    Mucositis  Moderate risk with ablative BU/Flu   Methotrexate may worsen in the short term  Cont good oral care, normal saline rinses  Provide BMX and pain medications as needed    At risk for deconditioning   Encouraged physical activity, ambulation  Consult PT if needed    Psychosocial  Cont emotional support     Smoking Cessation  Offered nicotine replacement & [place]  Smoking Cessation Program; patient declining at this time    Dispo planning  Anticipate discharge following engraftment and clinical stability  BMT consent signed  BMT attending [name]  Primary care physician [name]  RN coordinator Anne/Sharon  Lives in [place], [place]    Lab Data      Lab results: 05/20/15  3664 05/19/15  0225 05/18/15  4034 05/17/15  0345 05/16/15  7425 05/15/15  0112 05/14/15  0106 05/13/15  0107 05/12/15  0527 05/11/15  1649   WBC 2.5* 2.7* 3.9* 3.3* 7.6 19.5* 35.4* 44.3* 56.1* 43.7*   Hemoglobin 7.7* 7.5* 6.8* 7.6* 8.5* 8.3* 7.9* 6.5* 7.2* 7.1*   Hematocrit 23* 22* 21* 23* 26* 26* 25* 21* 23* 22*   RBC 2.7* 2.6* 2.4* 2.6* 3.0* 2.9* 2.7* 2.2* 2.5* 2.4*   Platelets 27* 37* 52* 36* 39* 47* 47* 54* 62* 59*   Neut # K/uL 2.2 2.6 3.7 3.2 7.0* 17.8* 29.0* 32.8* 38.7* 27.1*   Lymph # K/uL 0.2* 0.1* 0.1* 0.0* 0.2* 0.4* 1.1* 2.2 4.5* 2.6   Mono # K/uL 0.0* 0.0* 0.1* 0.0* 0.0* 0.0* 0.3 0.0* 1.4* 0.4   Eos # K/uL 0.0 0.0 0.0 0.0 0.0 0.0 0.0 0.4 0.0 0.0   Baso # K/uL 0.0 0.0 0.0 0.0 0.0 0.0  0.0  0.0 0.0 0.0   Seg Neut % 89.0 94.7 93.7 94.6 90.2 87.1 71.9 56.0 49.3 53.5   Lymphocyte % 6.1 4.4 2.6 0.9 1.8 1.7 2.6 5.3 7.6 6.1   Monocyte % 1.2 0.0 1.3 0.0 0.0 0.0 0.9 0.0 2.5 0.9   Eosinophil % 0.4 0.0 0.0 0.0 0.0 0.0 0.0 0.9 0.0 0.0   Basophil % 0.0 0.0 0.3 0.0 0.0 0.0 0.0 0.0 0.0 0.0   Bands %  --  1  --  _0 18* 20* 8   Myelocyte %  --   --   --  2* 2* 2* 9* 4* 2* 12*   Metamyelocyte %  --   --   --  2* 4* 5* 4* 13* 17* 12*   Promyelocyte %  --   --   --   --   --   --  2* 1*  --  4*   Blasts %  --   --   --   --   --   --   --  2* 2* 3*               Lab results: 05/20/15  0119 05/19/15  0225 05/18/15  0252   Sodium 134 135 132*   Potassium 5.0 5.0 4.9   Chloride 97 99 95*   CO2 _1 UN 27* 28* 29*   Creatinine 0.69 0.76 0.70   GFR,Caucasian 98 89 98   GFR,Black 113 102 113   Glucose 151* 249* 349*   Calcium 8.7 8.5* 9.0           Lab results: 05/20/15  0119   Magnesium 1.6       Lab Results   Component Value Date    ALT 38 (H) 05/20/2015    AST 25 05/20/2015     Lab Results   Component Value Date    ALK 45 05/20/2015     Lab Results   Component Value Date    TB 0.7 05/20/2015     LD   Date Value Ref Range Status   05/20/2015 271 (H) 118 - 225 U/L Final     LD, PL   Date Value Ref Range Status   05/09/2015 1146 (H) 118 - 225 U/L Final     LDL Calculated   Date Value Ref Range Status   08/27/2013 113 mg/dL Final     Comment:     REFERENCE RANGE:  < 100 Optimal                  100-129 Near or above optimal                  130-159 Borderline High                  160-189 High                    > 189 Very High

## 2015-05-20 NOTE — Progress Notes (Signed)
Carly Higgins, 55 y.o. female is here for chemotherapy infusion of: methotrexate    Schema Day: +3  Dose verified by 2 Rn's [x] YES [] NO  Pt identification verified by 2 Rn's  [x] YES [] NO  Treatment plan verified by 2 Rn's by provider note: [x] YES [] NO  Consent present in chart: [x] YES [] NO  Premeds given: [x] YES [] NO  IV device: [] TCVC[x] IVAD[] PICC[] PIV    IV Fluid given: [] YES [x] NO    Blood return present before infusion: [x] YES [] NO    Dose given: 20 mg    Blood return present after infusion: [x] YES [] NO     Patient tolerated well    Patient and [x] YES [] NO family educated related to   [x] YES [] NOdrugs received

## 2015-05-20 NOTE — Plan of Care (Signed)
PRN ativan given for nausea and vomiting.     Comfort - BMT-related     Nausea controlled or eliminated Progressing towards goal            Comfort - BMT-related     Diarrhea controlled or eliminated Maintaining     Without infection Maintaining        Graft Versus Host Disease (GVHD) Bundle     Without rash Maintaining     Stool output within parameters (</= 533ml in 24 h) Maintaining     Total bilirubin </= 2 Maintaining        Mobility     Patient's functional status is maintained or improved Maintaining        Nutrition     Patient's nutritional status is maintained or improved Maintaining        Pain/Comfort     Patient's pain or discomfort is manageable Maintaining        Psychosocial     Demonstrates ability to cope with illness Maintaining        Safety     Patient will remain free of falls Maintaining

## 2015-05-20 NOTE — Plan of Care (Signed)
Comfort - BMT-related     Diarrhea controlled or eliminated Maintaining     Without infection Maintaining        Graft Versus Host Disease (GVHD) Bundle     Without rash Maintaining     Stool output within parameters (</= 518ml in 24 h) Maintaining     Total bilirubin </= 2 Maintaining        Mobility     Patient's functional status is maintained or improved Maintaining        Nutrition     Patient's nutritional status is maintained or improved Maintaining        Pain/Comfort     Patient's pain or discomfort is manageable Maintaining        Psychosocial     Demonstrates ability to cope with illness Maintaining        Safety     Patient will remain free of falls Maintaining          Comfort - BMT-related     Nausea controlled or eliminated Progressing towards goal          (1500-1900) Pt. AVSS on room air, A&O x3. Pt received PRN IV ativan x1 for nausea, with positive effect. Fall prevention teaching reinforced with patient, patient verbalized understanding.  Will continue to monitor and provide handoff to oncoming nurse for continuity of care.

## 2015-05-20 NOTE — Consults (Signed)
Medical Nutrition Therapy - Follow-up    Admit Date: 05/11/2015    Reason for consult: eval and treat    Patient Summary:   55 year old woman with unclassifiable myeloproliferative neoplasm causing leukocytosis with some myeloid immaturity. By [employer] testing, she has mutations of TET2, ASXL1, CCND2, GATA2, SRSF2.  Carly Higgins is now admitted for preparation for an unrelated donor transplant PBSCT with Day 0 = 05/17/15 after conditioning with busulfan/fludarabine.    Past Medical History:   Diagnosis Date    GERD (gastroesophageal reflux disease)     Shortness of breath      Past Surgical History:   Procedure Laterality Date    ROTATOR CUFF REPAIR Right         Pertinent Social Hx: lives locally, divorced.    Pertinent Meds: reviewed, SS insulin, MTX, tacro gtt     Pertinent Labs: reviewed,   ANC 2.2, plt 27   electrolytes, glucose, creatinine, LFTs all within normal or acceptable levels for current medical status    Reviewed I/O's   Oral intake: 1300-4400 ml/d for the past 5 days  Yesterday  Emesis 300 ml, stool x 5    Access: DL IVAD    Nutrition Hx: some days during chemo when she didn't feel like eating much, but says "not too many".    Food allergies: NKFA    Current diet: regular  Supplements: none    Nutrition Focused Physical Exam:  Edema: none  Abdomen: WDL  Oral score:  13  Skin: WDL  Body fat stores: adequate  Lean body mass stores: adequate    Anthropometrics:  Height: 169.1 cm (5' 6.58") (with Hervey Ard RN)    Current Weight: 112.5 kg (248 lb);   Admit weight:  118.6 kg  UBW: 113-118 kg  Ideal Body Weight: 60.2 kg + 10%  Body mass index is 39.34 kg/(m^2). using current weight;  Obese grade 3    Weight Hx:   Stable in the 113-118 kg range over the past several years    Estimated Nutrient Needs: (Based on 60.2 kg)    1800-2100 kcal/day (30-35 kcal/kg)   90-120 g protein/day (1.5-2.0 g/kg)    2100 mL fluid/day (35 mL/kg)      Nutrition Assessment and Diagnosis:     Patient is ~6 kg below admit weight -  this is a loss of 5% of body weight over the past 1.5 week, and is significant unintended weight loss.    Reports not eating much due to nausea.  Says she has been taking some Ensure shakes with ice cream.  Does not c/o significant mucositis.    Malnutrition Status:   Does not currently fit criteria for either moderate or severe protein calorie malnutrition  Nutrition Intervention:   1. continue regular diet, encourage intake  2. Add Ensure Enlive TID    Nutrition Monitoring/Evaluation:   1. Will monitor diet tolerance and intake, nutrition-related labs, weight trend, BM pattern.    2. Nutrition to follow up per high nutrition risk protocol.    Rose Fillers, [place], [place], [place] pager [phone]

## 2015-05-20 NOTE — Plan of Care (Signed)
Comfort - BMT-related    • Diarrhea controlled or eliminated Maintaining    • Without infection Maintaining        Graft Versus Host Disease (GVHD) Bundle    • Without rash Maintaining    • Stool output within parameters (</= 500ml in 24 h) Maintaining    • Total bilirubin </= 2 Maintaining        Mobility    • Patient's functional status is maintained or improved Maintaining        Nutrition    • Patient's nutritional status is maintained or improved Maintaining        Pain/Comfort    • Patient's pain or discomfort is manageable Maintaining        Psychosocial    • Demonstrates ability to cope with illness Maintaining        Safety    • Patient will remain free of falls Maintaining          Comfort - BMT-related    • Nausea controlled or eliminated Progressing towards goal

## 2015-05-20 NOTE — Interdisciplinary Rounds (Signed)
Interdisciplinary Rounds Note    Date: 05/20/2015   Time: 8:16 AM   Attendance:  Attending, PA, Charge RN    Admit Date/Time:  05/11/2015  2:02 PM    Principal Problem: <principal problem not specified>  Problem List:   Patient Active Problem List    Diagnosis Date Noted    Myeloproliferative disease 05/11/2015    MPN (myeloproliferative neoplasm) 01/19/2014    Leukocytosis 09/22/2013       The patient's problem list and interdisciplinary care plan was reviewed.    Discharge Planning                 *Does patient currently have home care services?: No     *Current External Services: None                      Plan    05/14/15 Day -3.  Will receive Flu/Bu tonight.  Pt tolerating chemo well.  Continue supportive care.   5/7: doing well, continue supportive care  5/8:  Day -1.  Mild nausea, fatigue.  Continue supportive care.  05/17/2015 Day 0, WBC 3.3, ANC 3.2, Hct 23, plts 36, will receive MUD PBSCs today, continue supportive care.  05/18/2015 Day +1 WBC 3.9, ANC 3.7, hct 21, plts 52K, received MUD PBSCs yesterday, tolerated well, MTX given per protocol, continue supportive care.  05/19/15:  Day +2.  Counts dropping with ANC 2600 today.  Glucose control with BG's and SS insulin coverage d/t steroids.  Continue supportive care.  05/20/15 D +3  Labs stable/falling. Multiple episodes of vomiting yesterday, abd discomfort.   Continue supportive care.      Anticipated Discharge Date:  Discharge Disposition: Home, choiced to [facility]

## 2015-05-21 LAB — CBC AND DIFFERENTIAL
Baso # K/uL: 0 10*3/uL (ref 0.0–0.1)
Basophil %: 0 %
Eos # K/uL: 0 10*3/uL (ref 0.0–0.4)
Eosinophil %: 0 %
Hematocrit: 20 % — ABNORMAL LOW (ref 34–45)
Hemoglobin: 6.7 g/dL — ABNORMAL LOW (ref 11.2–15.7)
IMM Granulocytes #: 0.1 10*3/uL (ref 0.0–0.1)
IMM Granulocytes: 2.5 %
Lymph # K/uL: 0.1 10*3/uL — ABNORMAL LOW (ref 1.2–3.7)
Lymphocyte %: 6.9 %
MCH: 29 pg/cell (ref 26–32)
MCHC: 34 g/dL (ref 32–36)
MCV: 83 fL (ref 79–95)
Mono # K/uL: 0 10*3/uL — ABNORMAL LOW (ref 0.2–0.9)
Monocyte %: 1 %
Neut # K/uL: 1.8 10*3/uL (ref 1.6–6.1)
Nucl RBC # K/uL: 0 10*3/uL (ref 0.0–0.0)
Nucl RBC %: 0 /100 WBC (ref 0.0–0.2)
Platelets: 22 10*3/uL — ABNORMAL LOW (ref 160–370)
RBC: 2.3 MIL/uL — ABNORMAL LOW (ref 3.9–5.2)
RDW: 18.6 % — ABNORMAL HIGH (ref 11.7–14.4)
Seg Neut %: 89.6 %
WBC: 2 10*3/uL — ABNORMAL LOW (ref 4.0–10.0)

## 2015-05-21 LAB — POCT GLUCOSE
Glucose POCT: 236 mg/dL — ABNORMAL HIGH (ref 60–99)
Glucose POCT: 186 mg/dL — ABNORMAL HIGH (ref 60–99)
Glucose POCT: 210 mg/dL — ABNORMAL HIGH (ref 60–99)
Glucose POCT: 241 mg/dL — ABNORMAL HIGH (ref 60–99)

## 2015-05-21 LAB — MAGNESIUM: Magnesium: 1.5 mEq/L (ref 1.3–2.1)

## 2015-05-21 LAB — BASIC METABOLIC PANEL
Anion Gap: 16 (ref 7–16)
CO2: 23 mmol/L (ref 20–28)
Calcium: 8.7 mg/dL (ref 8.6–10.2)
Chloride: 98 mmol/L (ref 96–108)
Creatinine: 0.96 mg/dL — ABNORMAL HIGH (ref 0.51–0.95)
GFR,Black: 77 *
GFR,Caucasian: 67 *
Glucose: 183 mg/dL — ABNORMAL HIGH (ref 60–99)
Lab: 33 mg/dL — ABNORMAL HIGH (ref 6–20)
Potassium: 5.1 mmol/L (ref 3.3–5.1)
Sodium: 137 mmol/L (ref 133–145)

## 2015-05-21 LAB — TYPE AND SCREEN
ABO RH Blood Type: A POS
Antibody Screen: NEGATIVE

## 2015-05-21 LAB — TACROLIMUS LEVEL: Tacrolimus: 10.2 ng/mL

## 2015-05-21 MED ORDER — SODIUM CHLORIDE 0.9 % IV BOLUS *I*
1000.0000 mL | Freq: Every day | Status: DC
Start: 2015-05-21 — End: 2015-05-27
  Administered 2015-05-21 – 2015-05-25 (×3): 1000 mL via INTRAVENOUS

## 2015-05-21 MED ORDER — SODIUM CHLORIDE 0.9 % IV BOLUS *I*
500.0000 mL | Freq: Once | Status: AC
Start: 2015-05-21 — End: 2015-05-21
  Administered 2015-05-21: 500 mL via INTRAVENOUS

## 2015-05-21 MED ORDER — OXYCODONE HCL 5 MG PO TABS *I*
5.0000 mg | ORAL_TABLET | ORAL | Status: DC | PRN
Start: 2015-05-21 — End: 2015-05-24
  Administered 2015-05-23 – 2015-05-24 (×2): 5 mg via ORAL
  Filled 2015-05-21 (×2): qty 1

## 2015-05-21 MED ORDER — DIPHENHYDRAMINE-LIDOCAINE-MAALOX (BMX/FIRST MOUTHWASH) *WRAPPED*
30.0000 mL | ORAL | Status: DC | PRN
Start: 2015-05-21 — End: 2015-06-07
  Administered 2015-05-21 – 2015-05-28 (×3): 30 mL via OROMUCOSAL
  Filled 2015-05-21 (×38): qty 30

## 2015-05-21 NOTE — Interdisciplinary Rounds (Signed)
Interdisciplinary Rounds Note    Date: 05/21/2015   Time: 9:37 AM   Attendance:  Attending, PA, Charge RN    Admit Date/Time:  05/11/2015  2:02 PM    Principal Problem: <principal problem not specified>  Problem List:   Patient Active Problem List    Diagnosis Date Noted    Myeloproliferative disease 05/11/2015    MPN (myeloproliferative neoplasm) 01/19/2014    Leukocytosis 09/22/2013       The patient's problem list and interdisciplinary care plan was reviewed.    Discharge Planning                 *Does patient currently have home care services?: No     *Current External Services: None                      Plan    05/14/15 Day -3.  Will receive Flu/Bu tonight.  Pt tolerating chemo well.  Continue supportive care.   5/7: doing well, continue supportive care  5/8:  Day -1.  Mild nausea, fatigue.  Continue supportive care.  05/17/2015 Day 0, WBC 3.3, ANC 3.2, Hct 23, plts 36, will receive MUD PBSCs today, continue supportive care.  05/18/2015 Day +1 WBC 3.9, ANC 3.7, hct 21, plts 52K, received MUD PBSCs yesterday, tolerated well, MTX given per protocol, continue supportive care.  05/19/15:  Day +2.  Counts dropping with ANC 2600 today.  Glucose control with BG's and SS insulin coverage d/t steroids.  Continue supportive care.  05/20/15 D +3  Labs stable/falling. Multiple episodes of vomiting yesterday, abd discomfort.   Continue supportive care.  5/13: Day +4. BMX for mucositis. Dizzy- 500 ml bolus and add daily prn bolus. Helmet/assist out of bed.    Anticipated Discharge Date:  Discharge Disposition: Home, choiced to [facility]

## 2015-05-21 NOTE — Progress Notes (Addendum)
Blood & Marrow Transplant Program APP Progress Note    CC   Carly Higgins is a 55 y.o. 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 By [employer] testing, she has mutations of TET2, ASXL1, CCND2, GATA2, SRSF2.     Admitted for MUD allogeneic PBSC transplant.     Length of stay  LOS: 10 days     Interval History   This morning patient reports her nausea and GERD are somewhat improved, continues with general abdominal discomfort.  Reports sore throat with lesions visible on exam, PRN BMX and oxycodone. Creatinine bumped, gave 500cc bolus and added daily 1L bolus PRN poor PO intake.    Review of Systems   A comprehensive review of 13 systems was performed; pertinent positives checked below:  _0  fevers  _1  fast pulse _2  fatigue   _3  chills _4  chest pain _5  dysuria    _6  dry eyes _7  nausea/emesis  _8  rash   _9  ear pain _10  abdominal cramping _11  skin changes   _12  sores in mouth _13  abdominal discomfort _14  pain   _15  sore throat _16  loose stool, non-formed _17  none of the above   _18  cough  _19  diarrhea, watery    _20  sputum production  _21  weight loss    _22  shortness of breath _23  insufficient oral intake      Vital Signs and I&O   Temp:  [36 C (96.8 F)-36.8 C (98.2 F)] 36.4 C (97.5 F)  Heart Rate:  [87-103] 93  Resp:  [16-20] 18  BP: (99-136)/(57-81) 125/68    Intake/Output Summary (Last 24 hours) at 05/21/15 1223  Last data filed at 05/21/15 0915   Gross per 24 hour   Intake           1054.9 ml   Output              125 ml   Net            929.9 ml        Physical Exam     Head:  Normocephalic, atraumatic   Eyes:  Conjunctiva/corneas clear   Throat: Oral mucosa pink & dry; erythematous lesions in posterior pharynx   Lungs:   CTA B/L, effort unlabored    Heart:  RRR, no murmur/rub/gallop   Abdomen:   Soft, nondistended, diffusely tender, BS present    Extremities: No edema   Pulses: 2+ and symmetric   Skin: No rashes or lesions   Neurologic: A&O X 3, CMS  intact   Central venous access: IVAD - C/D/I      Assessment & Plan   Carly Higgins is a 55 yo female with unclassifiable myeloproliferative neoplasm.  By [employer] testing, she has mutations of TET2, ASXL1, CCND2, GATA2, SRSF2. Admitted for MUD allogeneic PBSCT.     Atypical MPN  Completed Decitabine 58m/m2 IV daily x 5 days - Cycle # 1 12/15/14  On intermittent/dose adjusted hydroxyurea at admission - discontinued 5/5 in setting of falling WBC     Day -5 thru -2 (5/4-5/7):  Flu 422mm2, Bu 13069m2 - Busulfan dose adjusted to 285m61mr last 2 doses (from 244mg71may -1 (5/8): Rest day  Day 0 (5/9): MUD (10/10 HLA, female) allo PBSCT, ABO: A+/A+, CMV: neg/neg     Today is BMT day +4    Pancytopenia  Transfuse 1 unit RBCs for hematocrit <21% and/or transfuse 1 unit platelets for platelet count <11,000 OR transfuse 1 unit platelets  for platelet count <20,000 if bleeding or fever.  Helmet for safety during periods of thrombocytopenia, per hospital policy    DVT ppx  Enoxaparin - on hold for plts <30K     ID  At FIRST temperature spike >37.9   Order cultures  Obtain blood cultures (1 set of bactacs) from each central line port & one set of blood cultures drawn by venipuncture   Begin broad spectrum antibiotics with cefepime and flagyl (follow febrile neutropenia SOP)    Viral prophylaxis with acyclovir  Fungal prophylaxis with fluconazole    CV/pulm  Albuterol, Duonebs PRN wheezing     Endo  Lispro SS added for rising BGs, likely due to steroids, can stop when normalized    GVHD  Planned GVHD prophylaxis with tacrolimus and methotrexate  Tacro by continuous IV infusion beginning on day-1 via PIV  Target tacrolimus level 8-12    Methotrexate will dose equivalent leucovorin will be adminstered as follows:   15 mg/m2 on day +1 (>24 hrs from stem cell infusion) (full dose given)  10 mg/m2 on days +3, +6, and +11     Renal  Creatinine at admission was 0.7, 0.96 today - 500cc bolus given   Encouraged PO intake    Daily 1L bolus, hold for PO intake >1500cc      Electrolyte imbalance  Replace electrolytes as to the following parameters  Hypokalemia, supplement when serum K <3.6  Hypomagnesemia, supplement when serum Mag <1.5  Hypocalcemia, supplement when corrected calcium <8.9     At risk for malnutrition/hypoalbuminemia  Regular diet  Albumin level on admission 4.3  Nutrition following     GI  ATC Zofran   PRN Phenergan, Ativan, simethicone   Protonix BID for GERD, stress ulcer ppx  PRN Tums, carafate for GERD     VOD/SOS  Moderate risk with ablative BU/Flu  Ursodiol ppx    Mucositis  WHO Grade 2   Cont good oral care, normal saline rinses  PRN BMX, oxy    At risk for deconditioning   Encouraged physical activity, ambulation  Consult PT if needed    Psychosocial  Cont emotional support     Smoking Cessation  Offered nicotine replacement & [place] Smoking Cessation Program; patient declining at this time    Dispo planning  Anticipate discharge following engraftment and clinical stability  BMT consent signed  BMT attending [name]  Primary care physician [name]  RN coordinator Anne/Sharon  Lives in [place]    Patient seen and discussed with Dr. Samuel Germany, PA    Lab Data      Lab results: 05/21/15  2951 05/20/15  0119 05/19/15  0225  05/17/15  0345 05/16/15  8841 05/15/15  0112 05/14/15  0106 05/13/15  0107 05/12/15  0527 05/11/15  1649   WBC 2.0* 2.5* 2.7*  < > 3.3* 7.6 19.5* 35.4* 44.3* 56.1* 43.7*   Hemoglobin 6.7* 7.7* 7.5*  < > 7.6* 8.5* 8.3* 7.9* 6.5* 7.2* 7.1*   Hematocrit 20* 23* 22*  < > 23* 26* 26* 25* 21* 23* 22*   RBC 2.3* 2.7* 2.6*  < > 2.6* 3.0* 2.9* 2.7* 2.2* 2.5* 2.4*   Platelets 22* 27* 37*  < > 36* 39* 47* 47* 54* 62* 59*   Neut # K/uL 1.8 2.2 2.6  < > 3.2 7.0* 17.8* 29.0* 32.8* 38.7* 27.1*   Lymph # K/uL 0.1* 0.2* 0.1*  < > 0.0* 0.2* 0.4* 1.1* 2.2  4.5* 2.6   Mono # K/uL 0.0* 0.0* 0.0*  < > 0.0* 0.0* 0.0* 0.3 0.0* 1.4* 0.4   Eos # K/uL 0.0 0.0 0.0  < > 0.0 0.0  0.0 0.0 0.4 0.0 0.0   Baso # K/uL 0.0 0.0 0.0  < > 0.0 0.0 0.0 0.0 0.0 0.0 0.0   Seg Neut % 89.6 89.0 94.7  < > 94.6 90.2 87.1 71.9 56.0 49.3 53.5   Lymphocyte % 6.9 6.1 4.4  < > 0.9 1.8 1.7 2.6 5.3 7.6 6.1   Monocyte % 1.0 1.2 0.0  < > 0.0 0.0 0.0 0.9 0.0 2.5 0.9   Eosinophil % 0.0 0.4 0.0  < > 0.0 0.0 0.0 0.0 0.9 0.0 0.0   Basophil % 0.0 0.0 0.0  < > 0.0 0.0 0.0 0.0 0.0 0.0 0.0   Bands %  --   --  1  --  _0 18* 20* 8   Myelocyte %  --   --   --   --  2* 2* 2* 9* 4* 2* 12*   Metamyelocyte %  --   --   --   --  2* 4* 5* 4* 13* 17* 12*   Promyelocyte %  --   --   --   --   --   --   --  2* 1*  --  4*   Blasts %  --   --   --   --   --   --   --   --  2* 2* 3*   < > = values in this interval not displayed.            Lab results: 05/21/15  0242 05/20/15  0119 05/19/15  0225   Sodium 137 134 135   Potassium 5.1 5.0 5.0   Chloride 98 97 99   CO2 _1 UN 33* 27* 28*   Creatinine 0.96* 0.69 0.76   GFR,Caucasian 67 98 89   GFR,Black 77 113 102   Glucose 183* 151* 249*   Calcium 8.7 8.7 8.5*           Lab results: 05/21/15  0242   Magnesium 1.5       Lab Results   Component Value Date    ALT 38 (H) 05/20/2015    AST 25 05/20/2015     Lab Results   Component Value Date    ALK 45 05/20/2015     Lab Results   Component Value Date    TB 0.7 05/20/2015     LD   Date Value Ref Range Status   05/20/2015 271 (H) 118 - 225 U/L Final     LD, PL   Date Value Ref Range Status   05/09/2015 1146 (H) 118 - 225 U/L Final     LDL Calculated   Date Value Ref Range Status   08/27/2013 113 mg/dL Final     Comment:     REFERENCE RANGE:  < 100 Optimal                  100-129 Near or above optimal                  130-159 Borderline High                  160-189 High                    >  189 Very High

## 2015-05-21 NOTE — Progress Notes (Signed)
Comfort - BMT-related     Diarrhea controlled or eliminated Maintaining     Without infection Maintaining        Graft Versus Host Disease (GVHD) Bundle     Without rash Maintaining     Stool output within parameters (</= 577ml in 24 h) Maintaining     Total bilirubin </= 2 Maintaining        Mobility     Patient's functional status is maintained or improved Maintaining        Nutrition     Patient's nutritional status is maintained or improved Maintaining        Pain/Comfort     Patient's pain or discomfort is manageable Maintaining        Psychosocial     Demonstrates ability to cope with illness Maintaining        Safety     Patient will remain free of falls Maintaining          Comfort - BMT-related     Nausea controlled or eliminated Progressing towards goal          (1500-1900) Pt. AVSS on room air, A&O x3.  Fall prevention teaching reinforced with patient, patient verbalized understanding.  Will continue to monitor and provide handoff to oncoming nurse for continuity of care.

## 2015-05-21 NOTE — Plan of Care (Signed)
Pt nausea controlled with scheduled meds. PRN carafate given for indigestion with positive relief.     Comfort - BMT-related     Nausea controlled or eliminated Maintaining     Diarrhea controlled or eliminated Maintaining     Without infection Maintaining        Graft Versus Host Disease (GVHD) Bundle     Without rash Maintaining     Stool output within parameters (</= 565ml in 24 h) Maintaining     Total bilirubin </= 2 Maintaining        Mobility     Patient's functional status is maintained or improved Maintaining        Nutrition     Patient's nutritional status is maintained or improved Maintaining        Pain/Comfort     Patient's pain or discomfort is manageable Maintaining        Psychosocial     Demonstrates ability to cope with illness Maintaining        Safety     Patient will remain free of falls Maintaining

## 2015-05-22 LAB — DIFF MANUAL
Bands %: 1 % (ref 0–10)
Diff Based On: 117 CELLS

## 2015-05-22 LAB — BASIC METABOLIC PANEL
Anion Gap: 14 (ref 7–16)
CO2: 22 mmol/L (ref 20–28)
Calcium: 8.6 mg/dL (ref 8.6–10.2)
Chloride: 101 mmol/L (ref 96–108)
Creatinine: 0.86 mg/dL (ref 0.51–0.95)
GFR,Black: 88 *
GFR,Caucasian: 76 *
Glucose: 156 mg/dL — ABNORMAL HIGH (ref 60–99)
Lab: 31 mg/dL — ABNORMAL HIGH (ref 6–20)
Potassium: 5.1 mmol/L (ref 3.3–5.1)
Sodium: 137 mmol/L (ref 133–145)

## 2015-05-22 LAB — CBC AND DIFFERENTIAL
Baso # K/uL: 0 10*3/uL (ref 0.0–0.1)
Basophil %: 0 %
Eos # K/uL: 0 10*3/uL (ref 0.0–0.4)
Eosinophil %: 0 %
Hematocrit: 17 % — CL (ref 34–45)
Hemoglobin: 6 g/dL — ABNORMAL LOW (ref 11.2–15.7)
Lymph # K/uL: 0.1 10*3/uL — ABNORMAL LOW (ref 1.2–3.7)
Lymphocyte %: 11.1 %
MCH: 29 pg/cell (ref 26–32)
MCHC: 35 g/dL (ref 32–36)
MCV: 84 fL (ref 79–95)
Mono # K/uL: 0 10*3/uL — ABNORMAL LOW (ref 0.2–0.9)
Monocyte %: 0 %
Neut # K/uL: 1.1 10*3/uL — ABNORMAL LOW (ref 1.6–6.1)
Nucl RBC # K/uL: 0 10*3/uL (ref 0.0–0.0)
Nucl RBC %: 0 /100 WBC (ref 0.0–0.2)
Platelets: 11 10*3/uL — CL (ref 160–370)
RBC: 2.1 MIL/uL — ABNORMAL LOW (ref 3.9–5.2)
RDW: 17.8 % — ABNORMAL HIGH (ref 11.7–14.4)
Seg Neut %: 88 %
WBC: 1.2 10*3/uL — ABNORMAL LOW (ref 4.0–10.0)

## 2015-05-22 LAB — MAGNESIUM: Magnesium: 1.5 mEq/L (ref 1.3–2.1)

## 2015-05-22 LAB — TACROLIMUS LEVEL: Tacrolimus: 8.5 ng/mL

## 2015-05-22 LAB — RED BLOOD CELLS
Coded Blood type: 6200
Component blood type: A POS
Dispense status: TRANSFUSED

## 2015-05-22 LAB — RBC MORPHOLOGY

## 2015-05-22 LAB — POCT GLUCOSE
Glucose POCT: 142 mg/dL — ABNORMAL HIGH (ref 60–99)
Glucose POCT: 219 mg/dL — ABNORMAL HIGH (ref 60–99)
Glucose POCT: 206 mg/dL — ABNORMAL HIGH (ref 60–99)
Glucose POCT: 196 mg/dL — ABNORMAL HIGH (ref 60–99)

## 2015-05-22 MED ORDER — DIPHENHYDRAMINE HCL 25 MG PO TABS *I*
25.0000 mg | ORAL_TABLET | Freq: Every evening | ORAL | Status: DC | PRN
Start: 2015-05-22 — End: 2015-05-28
  Administered 2015-05-22: 25 mg via ORAL
  Filled 2015-05-22: qty 1

## 2015-05-22 MED ORDER — WITCH HAZEL-GLYCERIN EX PADS *I* WRAPPED
MEDICATED_PAD | Freq: Four times a day (QID) | CUTANEOUS | Status: DC | PRN
Start: 2015-05-22 — End: 2015-06-07
  Filled 2015-05-22: qty 40

## 2015-05-22 MED ORDER — HYDROCORTISONE 2.5 % RE CREA *I*
TOPICAL_CREAM | Freq: Three times a day (TID) | CUTANEOUS | Status: DC | PRN
Start: 2015-05-22 — End: 2015-06-07
  Filled 2015-05-22: qty 30

## 2015-05-22 MED ORDER — ONDANSETRON HCL 4 MG PO TABS *I*
4.0000 mg | ORAL_TABLET | Freq: Four times a day (QID) | ORAL | Status: DC
Start: 2015-05-22 — End: 2015-06-02
  Administered 2015-05-22 – 2015-06-02 (×13): 4 mg via ORAL
  Filled 2015-05-22 (×13): qty 1

## 2015-05-22 MED ORDER — LORAZEPAM 2 MG/ML IJ SOLN *I*
0.5000 mg | Freq: Four times a day (QID) | INTRAMUSCULAR | Status: DC | PRN
Start: 2015-05-22 — End: 2015-05-27
  Administered 2015-05-22 – 2015-05-27 (×3): 0.5 mg via INTRAVENOUS
  Filled 2015-05-22 (×3): qty 1

## 2015-05-22 MED ORDER — DOCUSATE SODIUM 100 MG PO CAPS *I*
100.0000 mg | ORAL_CAPSULE | Freq: Two times a day (BID) | ORAL | Status: DC | PRN
Start: 2015-05-22 — End: 2015-06-07

## 2015-05-22 MED ORDER — LORAZEPAM 1 MG PO TABS *I*
1.0000 mg | ORAL_TABLET | Freq: Four times a day (QID) | ORAL | Status: DC | PRN
Start: 2015-05-22 — End: 2015-05-27
  Administered 2015-05-23: 1 mg via ORAL
  Filled 2015-05-22: qty 1

## 2015-05-22 MED ORDER — ONDANSETRON HCL 2 MG/ML IV SOLN *I*
4.0000 mg | Freq: Four times a day (QID) | INTRAMUSCULAR | Status: DC
Start: 2015-05-22 — End: 2015-06-02
  Administered 2015-05-22 – 2015-06-02 (×31): 4 mg via INTRAVENOUS
  Filled 2015-05-22 (×31): qty 2

## 2015-05-22 NOTE — Plan of Care (Addendum)
Comfort - BMT-related     Without infection Maintaining        Graft Versus Host Disease (GVHD) Bundle     Without rash Maintaining     Stool output within parameters (</= 517ml in 24 h) Maintaining     Total bilirubin </= 2 Maintaining        Mobility     Patient's functional status is maintained or improved Maintaining        Pain/Comfort     Patient's pain or discomfort is manageable Maintaining        Psychosocial     Demonstrates ability to cope with illness Maintaining        Safety     Patient will remain free of falls Maintaining          Comfort - BMT-related     Nausea controlled or eliminated Progressing towards goal     Diarrhea controlled or eliminated Progressing towards goal        Nutrition     Patient's nutritional status is maintained or improved Progressing towards goal          Pt. Has been AVSS for me all shift. Pt. Got PRN benadryl for sleep with positive effect and PRN Ativan for nausea with positive effect and sulcrafate and tums for indigestion with positive effect, will continue to monitor. Encourage pt. To keep wearing her helmet as well.    Levonne Spiller, RN

## 2015-05-22 NOTE — Plan of Care (Signed)
Pt received prn simethicone for abdominal cramping and carafate for indigestion with positive relief.  Complaints of nausea, prn meds refused by patient.      PRN tylenol given as pre medication before RBC transfusion.  Vital signs taken appropriately, pt tolerated transfusion well.      Comfort - BMT-related     Nausea controlled or eliminated Maintaining     Diarrhea controlled or eliminated Maintaining     Without infection Maintaining        Graft Versus Host Disease (GVHD) Bundle     Without rash Maintaining     Stool output within parameters (</= 570ml in 24 h) Maintaining     Total bilirubin </= 2 Maintaining        Mobility     Patient's functional status is maintained or improved Maintaining        Nutrition     Patient's nutritional status is maintained or improved Maintaining        Pain/Comfort     Patient's pain or discomfort is manageable Maintaining        Psychosocial     Demonstrates ability to cope with illness Maintaining        Safety     Patient will remain free of falls Maintaining

## 2015-05-22 NOTE — Plan of Care (Signed)
Comfort - BMT-related    • Diarrhea controlled or eliminated Maintaining    • Without infection Maintaining        Graft Versus Host Disease (GVHD) Bundle    • Without rash Maintaining    • Stool output within parameters (</= 500ml in 24 h) Maintaining    • Total bilirubin </= 2 Maintaining        Psychosocial    • Demonstrates ability to cope with illness Maintaining        Safety    • Patient will remain free of falls Maintaining          Comfort - BMT-related    • Nausea controlled or eliminated Progressing towards goal        Mobility    • Patient's functional status is maintained or improved Progressing towards goal        Nutrition    • Patient's nutritional status is maintained or improved Progressing towards goal        Pain/Comfort    • Patient's pain or discomfort is manageable Progressing towards goal

## 2015-05-22 NOTE — Progress Notes (Signed)
Blood & Marrow Transplant Program APP Progress Note    CC   Carly Higgins is a 55 y.o. 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 By [employer] testing, she has mutations of TET2, ASXL1, CCND2, GATA2, SRSF2.     Admitted for MUD allogeneic PBSC transplant.     Length of stay  LOS: 11 days     Interval History   Patient reports increased rectal pain with defecation today, region without obvious hemorrhoid, fissure, or abscess on exam.  PRN Tucks, Anusol, oxy.  Counts continue to drop, BGs stable.  Continue PRN bolus for poor PO intake.     Review of Systems   A comprehensive review of 13 systems was performed; pertinent positives checked below:  _0  fevers  _1  fast pulse _2  fatigue   _3  chills _4  chest pain _5  dysuria    _6  dry eyes _7  nausea/emesis  _8  rash   _9  ear pain _10  abdominal cramping _11  skin changes   _12  sores in mouth _13  abdominal discomfort _14  pain   _15  sore throat _16  loose stool, non-formed _17  none of the above   _18  cough  _19  diarrhea, watery    _20  sputum production  _21  weight loss    _22  shortness of breath _23  insufficient oral intake      Vital Signs and I&O   Temp:  [36.2 C (97.2 F)-36.6 C (97.9 F)] 36.5 C (97.7 F)  Heart Rate:  [76-98] 87  Resp:  [16] 16  BP: (110-136)/(55-70) 110/59    Intake/Output Summary (Last 24 hours) at 05/22/15 1253  Last data filed at 05/22/15 1114   Gross per 24 hour   Intake          2064.65 ml   Output             1600 ml   Net           464.65 ml        Physical Exam     Head:  Normocephalic, atraumatic   Eyes:  Conjunctiva/corneas clear   Throat: Oral mucosa pink & dry; erythematous lesions in posterior pharynx   Lungs:   CTA B/L, effort unlabored    Heart:  RRR, no murmur/rub/gallop   Abdomen:   Soft, nondistended, diffusely tender, BS present; anus and surrounding erythematous without obvious hemorrhoid, fissure, abscess    Extremities: No edema   Pulses: 2+ and symmetric   Skin: No rashes  or lesions   Neurologic: A&O X 3, CMS intact   Central venous access: IVAD - C/D/I      Assessment & Plan   Carly Higgins is a 55 yo female with unclassifiable myeloproliferative neoplasm.  By [employer] testing, she has mutations of TET2, ASXL1, CCND2, GATA2, SRSF2. Admitted for MUD allogeneic PBSCT.     Atypical MPN  Completed Decitabine 17m/m2 IV daily x 5 days - Cycle # 1 12/15/14  On intermittent/dose adjusted hydroxyurea at admission - discontinued 5/5 in setting of falling WBC     Day -5 thru -2 (5/4-5/7):  Flu 431mm2, Bu 13026m2 - Busulfan dose adjusted to 285m43mr last 2 doses (from 244mg8may -1 (5/8): Rest day  Day 0 (5/9): MUD (10/10 HLA, female) allo PBSCT, ABO: A+/A+, CMV: neg/neg     Today is BMT day +5    Pancytopenia  Transfuse 1 unit RBCs for hematocrit <21% and/or transfuse 1 unit platelets for platelet count <11,000 OR transfuse 1 unit  platelets for platelet count <20,000 if bleeding or fever.  Helmet for safety during periods of thrombocytopenia, per hospital policy    DVT ppx  Enoxaparin - on hold for plts <30K     ID  At FIRST temperature spike >37.9   Order cultures  Obtain blood cultures (1 set of bactacs) from each central line port & one set of blood cultures drawn by venipuncture   Begin broad spectrum antibiotics with cefepime and flagyl (follow febrile neutropenia SOP)    Viral prophylaxis with acyclovir  Fungal prophylaxis with fluconazole    CV/pulm  Albuterol, Duonebs PRN wheezing     Endo  Lispro SS added for rising BGs, likely due to steroids, can stop when normalized    GVHD  Planned GVHD prophylaxis with tacrolimus and methotrexate  Tacro by continuous IV infusion beginning on day-1 via PIV  Target tacrolimus level 8-12    Methotrexate will dose equivalent leucovorin will be adminstered as follows:   15 mg/m2 on day +1 (>24 hrs from stem cell infusion) (full dose given)  10 mg/m2 on days +3, +6, and +11     Renal  Creatinine at admission was 0.7,  stable  Encouraged PO intake   Daily 1L bolus, hold for PO intake >1500cc      Electrolyte imbalance  Replace electrolytes as to the following parameters  Hypokalemia, supplement when serum K <3.6  Hypomagnesemia, supplement when serum Mag <1.5  Hypocalcemia, supplement when corrected calcium <8.9     At risk for malnutrition/hypoalbuminemia  Regular diet  Albumin level on admission 4.3  Nutrition following     GI  ATC Zofran   PRN Phenergan, Ativan, simethicone   Protonix BID for GERD, stress ulcer ppx  PRN Tums, carafate for GERD   PRN Tucks, Anusol for rectal/anal pain     VOD/SOS  Moderate risk with ablative BU/Flu  Ursodiol ppx    Mucositis  WHO Grade 2   Cont good oral care, normal saline rinses  PRN BMX, oxy    At risk for deconditioning   Encouraged physical activity, ambulation  Consult PT if needed    Psychosocial  Cont emotional support     Smoking Cessation  Offered nicotine replacement & [place] Smoking Cessation Program; patient declining at this time    Dispo planning  Anticipate discharge following engraftment and clinical stability  BMT consent signed  BMT attending [name]  Primary care physician [name]  RN coordinator Anne/Sharon  Lives in [place]    Patient seen and discussed with Dr. Samuel Germany, PA    Lab Data      Lab results: 05/22/15  4132 05/21/15  4401 05/20/15  0119 05/19/15  0225  05/17/15  0345 05/16/15  0272 05/15/15  0112 05/14/15  0106 05/13/15  0107 05/12/15  0527 05/11/15  1649   WBC 1.2* 2.0* 2.5* 2.7*  < > 3.3* 7.6 19.5* 35.4* 44.3* 56.1* 43.7*   Hemoglobin 6.0* 6.7* 7.7* 7.5*  < > 7.6* 8.5* 8.3* 7.9* 6.5* 7.2* 7.1*   Hematocrit 17* 20* 23* 22*  < > 23* 26* 26* 25* 21* 23* 22*   RBC 2.1* 2.3* 2.7* 2.6*  < > 2.6* 3.0* 2.9* 2.7* 2.2* 2.5* 2.4*   Platelets 11* 22* 27* 37*  < > 36* 39* 47* 47* 54* 62* 59*   Neut # K/uL 1.1* 1.8 2.2 2.6  < > 3.2 7.0* 17.8* 29.0* 32.8* 38.7* 27.1*   Lymph #  K/uL 0.1* 0.1* 0.2* 0.1*  < > 0.0* 0.2* 0.4* 1.1*  2.2 4.5* 2.6   Mono # K/uL 0.0* 0.0* 0.0* 0.0*  < > 0.0* 0.0* 0.0* 0.3 0.0* 1.4* 0.4   Eos # K/uL 0.0 0.0 0.0 0.0  < > 0.0 0.0 0.0 0.0 0.4 0.0 0.0   Baso # K/uL 0.0 0.0 0.0 0.0  < > 0.0 0.0 0.0 0.0 0.0 0.0 0.0   Seg Neut % 88.0 89.6 89.0 94.7  < > 94.6 90.2 87.1 71.9 56.0 49.3 53.5   Lymphocyte % 11.1 6.9 6.1 4.4  < > 0.9 1.8 1.7 2.6 5.3 7.6 6.1   Monocyte % 0.0 1.0 1.2 0.0  < > 0.0 0.0 0.0 0.9 0.0 2.5 0.9   Eosinophil % 0.0 0.0 0.4 0.0  < > 0.0 0.0 0.0 0.0 0.9 0.0 0.0   Basophil % 0.0 0.0 0.0 0.0  < > 0.0 0.0 0.0 0.0 0.0 0.0 0.0   Bands % 1  --   --  1  --  _0 18* 20* 8   Myelocyte %  --   --   --   --   --  2* 2* 2* 9* 4* 2* 12*   Metamyelocyte %  --   --   --   --   --  2* 4* 5* 4* 13* 17* 12*   Promyelocyte %  --   --   --   --   --   --   --   --  2* 1*  --  4*   Blasts %  --   --   --   --   --   --   --   --   --  2* 2* 3*   < > = values in this interval not displayed.            Lab results: 05/22/15  0247 05/21/15  0242 05/20/15  0119   Sodium 137 137 134   Potassium 5.1 5.1 5.0   Chloride 101 98 97   CO2 _1 UN 31* 33* 27*   Creatinine 0.86 0.96* 0.69   GFR,Caucasian 76 67 98   GFR,Black 88 77 113   Glucose 156* 183* 151*   Calcium 8.6 8.7 8.7           Lab results: 05/22/15  0247   Magnesium 1.5       Lab Results   Component Value Date    ALT 38 (H) 05/20/2015    AST 25 05/20/2015     Lab Results   Component Value Date    ALK 45 05/20/2015     Lab Results   Component Value Date    TB 0.7 05/20/2015     LD   Date Value Ref Range Status   05/20/2015 271 (H) 118 - 225 U/L Final     LD, PL   Date Value Ref Range Status   05/09/2015 1146 (H) 118 - 225 U/L Final     LDL Calculated   Date Value Ref Range Status   08/27/2013 113 mg/dL Final     Comment:     REFERENCE RANGE:  < 100 Optimal                  100-129 Near or above optimal                  130-159 Borderline High  160-189 High                    > 189 Very High

## 2015-05-22 NOTE — Interdisciplinary Rounds (Signed)
Interdisciplinary Rounds Note    Date: 05/22/2015   Time: 11:08 AM   Attendance:  Attending, PA, Charge RN    Admit Date/Time:  05/11/2015  2:02 PM    Principal Problem: <principal problem not specified>  Problem List:   Patient Active Problem List    Diagnosis Date Noted    Myeloproliferative disease 05/11/2015    MPN (myeloproliferative neoplasm) 01/19/2014    Leukocytosis 09/22/2013       The patient's problem list and interdisciplinary care plan was reviewed.    Discharge Planning                 *Does patient currently have home care services?: No     *Current External Services: None                      Plan    05/14/15 Day -3.  Will receive Flu/Bu tonight.  Pt tolerating chemo well.  Continue supportive care.   5/7: doing well, continue supportive care  5/8:  Day -1.  Mild nausea, fatigue.  Continue supportive care.  05/17/2015 Day 0, WBC 3.3, ANC 3.2, Hct 23, plts 36, will receive MUD PBSCs today, continue supportive care.  05/18/2015 Day +1 WBC 3.9, ANC 3.7, hct 21, plts 52K, received MUD PBSCs yesterday, tolerated well, MTX given per protocol, continue supportive care.  05/19/15:  Day +2.  Counts dropping with ANC 2600 today.  Glucose control with BG's and SS insulin coverage d/t steroids.  Continue supportive care.  05/20/15 D +3  Labs stable/falling. Multiple episodes of vomiting yesterday, abd discomfort.   Continue supportive care.  5/13: Day +4. BMX for mucositis. Dizzy- 500 ml bolus and add daily prn bolus. Helmet/assist out of bed.  05/22/15: Day +5 today, c/o rectal pain after having diarrhea, given topicals to alleviate burning discomfort, feeling better today. Continue supportive care.    Anticipated Discharge Date:  Discharge Disposition: Home, choiced to [facility]

## 2015-05-23 ENCOUNTER — Encounter: Payer: Self-pay | Admitting: Gastroenterology

## 2015-05-23 LAB — CBC AND DIFFERENTIAL
Baso # K/uL: 0 10*3/uL (ref 0.0–0.1)
Basophil %: 0 %
Eos # K/uL: 0 10*3/uL (ref 0.0–0.4)
Eosinophil %: 0 %
Hematocrit: 19 % — CL (ref 34–45)
Hemoglobin: 6.5 g/dL — ABNORMAL LOW (ref 11.2–15.7)
Lymph # K/uL: 0.1 10*3/uL — ABNORMAL LOW (ref 1.2–3.7)
Lymphocyte %: 18.3 %
MCH: 29 pg/cell (ref 26–32)
MCHC: 34 g/dL (ref 32–36)
MCV: 84 fL (ref 79–95)
Mono # K/uL: 0 10*3/uL — ABNORMAL LOW (ref 0.2–0.9)
Monocyte %: 0 %
Neut # K/uL: 0.7 10*3/uL — ABNORMAL LOW (ref 1.6–6.1)
Nucl RBC # K/uL: 0 10*3/uL (ref 0.0–0.0)
Nucl RBC %: 0 /100 WBC (ref 0.0–0.2)
Platelets: 7 10*3/uL — CL (ref 160–370)
RBC: 2.3 MIL/uL — ABNORMAL LOW (ref 3.9–5.2)
RDW: 17.4 % — ABNORMAL HIGH (ref 11.7–14.4)
Seg Neut %: 81.7 %
WBC: 0.8 10*3/uL — ABNORMAL LOW (ref 4.0–10.0)

## 2015-05-23 LAB — COMPREHENSIVE METABOLIC PANEL
ALT: 37 U/L — ABNORMAL HIGH (ref 0–35)
AST: 18 U/L (ref 0–35)
Albumin: 3.6 g/dL (ref 3.5–5.2)
Alk Phos: 46 U/L (ref 35–105)
Anion Gap: 14 (ref 7–16)
Bilirubin,Total: 0.5 mg/dL (ref 0.0–1.2)
CO2: 23 mmol/L (ref 20–28)
Calcium: 8.6 mg/dL (ref 8.6–10.2)
Chloride: 100 mmol/L (ref 96–108)
Creatinine: 0.84 mg/dL (ref 0.51–0.95)
GFR,Black: 91 *
GFR,Caucasian: 79 *
Glucose: 155 mg/dL — ABNORMAL HIGH (ref 60–99)
Lab: 29 mg/dL — ABNORMAL HIGH (ref 6–20)
Potassium: 5 mmol/L (ref 3.3–5.1)
Sodium: 137 mmol/L (ref 133–145)
Total Protein: 5.7 g/dL — ABNORMAL LOW (ref 6.3–7.7)

## 2015-05-23 LAB — POCT GLUCOSE
Glucose POCT: 178 mg/dL — ABNORMAL HIGH (ref 60–99)
Glucose POCT: 146 mg/dL — ABNORMAL HIGH (ref 60–99)
Glucose POCT: 182 mg/dL — ABNORMAL HIGH (ref 60–99)
Glucose POCT: 162 mg/dL — ABNORMAL HIGH (ref 60–99)

## 2015-05-23 LAB — BILIRUBIN, DIRECT: Bilirubin,Direct: <0.2 mg/dL (ref 0.0–0.3)

## 2015-05-23 LAB — RED BLOOD CELLS
Coded Blood type: 6200
Component blood type: A POS
Dispense status: TRANSFUSED

## 2015-05-23 LAB — DIFF MANUAL: Diff Based On: 115 CELLS

## 2015-05-23 LAB — TACROLIMUS LEVEL: Tacrolimus: 8.7 ng/mL

## 2015-05-23 LAB — LACTATE DEHYDROGENASE: LD: 191 U/L (ref 118–225)

## 2015-05-23 LAB — RBC MORPHOLOGY

## 2015-05-23 LAB — BACT FLUID CULT (BOTTLES): Bact Fluid Cult(Bottles): 0

## 2015-05-23 LAB — PHOSPHORUS: Phosphorus: 4.5 mg/dL (ref 2.7–4.5)

## 2015-05-23 LAB — MAGNESIUM: Magnesium: 1.5 mEq/L (ref 1.3–2.1)

## 2015-05-23 NOTE — Progress Notes (Addendum)
Blood & Marrow Transplant Program APP Progress Note    CC   Carly Higgins is a 55 y.o. 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 By [employer] testing, she has mutations of TET2, ASXL1, CCND2, GATA2, SRSF2.     Admitted for MUD allogeneic PBSC transplant.     Length of stay  LOS: 12 days     Interval History   Day +6  Rectal pain controlled with prn tucks, anusol and oxy  tacro 8.7- no changes  C/o a lot of watery loose stool- ordered c.diff; awaiting results  Counts continuing to drop; Received 1unit prbc and 1 unit plt today     Review of Systems   A comprehensive review of 13 systems was performed; pertinent positives checked below:  _0  fevers  _1  fast pulse _2  fatigue   _3  chills _4  chest pain _5  dysuria    _6  dry eyes _7  nausea/emesis  _8  rash   _9  ear pain _10  abdominal cramping _11  skin changes   _12  sores in mouth _13  abdominal discomfort _14  pain   _15  sore throat _16  loose stool, non-formed _17  none of the above   _18  cough  _19  diarrhea, watery    _20  sputum production  _21  weight loss    _22  shortness of breath _23  insufficient oral intake      Vital Signs and I&O   Temp:  [36 C (96.8 F)-37 C (98.6 F)] 37 C (98.6 F)  Heart Rate:  [74-90] 81  Resp:  [16-20] 18  BP: (115-158)/(55-80) 149/80    Intake/Output Summary (Last 24 hours) at 05/23/15 1302  Last data filed at 05/23/15 1140   Gross per 24 hour   Intake          3036.34 ml   Output             2050 ml   Net           986.34 ml        Physical Exam     Head:  Normocephalic, atraumatic   Eyes:  Conjunctiva/corneas clear   Throat: Oral mucosa pink & dry; erythematous lesions in posterior pharynx   Lungs:   CTA B/L, effort unlabored    Heart:  RRR, no murmur/rub/gallop   Abdomen:   Soft, nondistended, diffusely tender, BS present; anus and surrounding erythematous without obvious hemorrhoid, fissure, abscess    Extremities: No edema   Pulses: 2+ and symmetric   Skin: No rashes or lesions    Neurologic: A&O X 3, CMS intact   Central venous access: IVAD - C/D/I      Assessment & Plan   Carly Higgins is a 55 yo female with unclassifiable myeloproliferative neoplasm.  By [employer] testing, she has mutations of TET2, ASXL1, CCND2, GATA2, SRSF2. Admitted for MUD allogeneic PBSCT.     Atypical MPN  Completed Decitabine 17m/m2 IV daily x 5 days - Cycle # 1 12/15/14  On intermittent/dose adjusted hydroxyurea at admission - discontinued 5/5 in setting of falling WBC     Day -5 thru -2 (5/4-5/7):  Flu 447mm2, Bu 13034m2 - Busulfan dose adjusted to 285m58mr last 2 doses (from 244mg61may -1 (5/8): Rest day  Day 0 (5/9): MUD (10/10 HLA, female) allo PBSCT, ABO: A+/A+, CMV: neg/neg     Today is BMT day +6    Pancytopenia  Transfuse 1 unit RBCs for hematocrit <21% and/or transfuse 1 unit platelets for platelet count <  11,000 OR transfuse 1 unit platelets for platelet count <20,000 if bleeding or fever.  Helmet for safety during periods of thrombocytopenia, per hospital policy    DVT ppx  Enoxaparin - on hold for plts <30K     ID  At FIRST temperature spike >37.9   Order cultures  Obtain blood cultures (1 set of bactacs) from each central line port & one set of blood cultures drawn by venipuncture   Begin broad spectrum antibiotics with cefepime and flagyl (follow febrile neutropenia SOP)    Viral prophylaxis with acyclovir  Fungal prophylaxis with fluconazole    CV/pulm  Albuterol, Duonebs PRN wheezing     Endo  Lispro SS added for rising BGs, likely due to steroids, can stop when normalized    GVHD  Planned GVHD prophylaxis with tacrolimus and methotrexate  Tacro by continuous IV infusion beginning on day-1 via PIV  Target tacrolimus level 8-12    Methotrexate will dose equivalent leucovorin will be adminstered as follows:   15 mg/m2 on day +1 (>24 hrs from stem cell infusion) (full dose given)  10 mg/m2 on days +3, +6, and +11     Renal  Creatinine at admission was 0.7, stable  Encouraged PO intake    Daily 1L bolus, hold for PO intake >1500cc      Electrolyte imbalance  Replace electrolytes as to the following parameters  Hypokalemia, supplement when serum K <3.6  Hypomagnesemia, supplement when serum Mag <1.5  Hypocalcemia, supplement when corrected calcium <8.9     At risk for malnutrition/hypoalbuminemia  Regular diet  Albumin level on admission 4.3  Nutrition following     GI  ATC Zofran   PRN Phenergan, Ativan, simethicone   Protonix BID for GERD, stress ulcer ppx  PRN Tums, carafate for GERD   PRN Tucks, Anusol for rectal/anal pain  C. Diff sent 5/15- awaiting results     VOD/SOS  Moderate risk with ablative BU/Flu  Ursodiol ppx    Mucositis  WHO Grade 2   Cont good oral care, normal saline rinses  PRN BMX, oxy    At risk for deconditioning   Encouraged physical activity, ambulation  Consult PT if needed    Psychosocial  Cont emotional support     Smoking Cessation  Offered nicotine replacement & [place] [facility]; patient declining at this time    Dispo planning  Anticipate discharge following engraftment and clinical stability  BMT consent signed  BMT attending [name]  Primary care physician [name]  RN coordinator Anne/Sharon  Lives in [place]    Patient seen and discussed with Dr. Soledad Gerlach, NP    Lab Data      Lab results: 05/23/15  0139 05/22/15  0109 05/21/15  3235  05/19/15  0225  05/17/15  0345 05/16/15  5732 05/15/15  0112 05/14/15  0106 05/13/15  0107 05/12/15  0527 05/11/15  1649   WBC 0.8* 1.2* 2.0*  < > 2.7*  < > 3.3* 7.6 19.5* 35.4* 44.3* 56.1* 43.7*   Hemoglobin 6.5* 6.0* 6.7*  < > 7.5*  < > 7.6* 8.5* 8.3* 7.9* 6.5* 7.2* 7.1*   Hematocrit 19* 17* 20*  < > 22*  < > 23* 26* 26* 25* 21* 23* 22*   RBC 2.3* 2.1* 2.3*  < > 2.6*  < > 2.6* 3.0* 2.9* 2.7* 2.2* 2.5* 2.4*   Platelets 7* 11* 22*  < > 37*  < > 36* 39* 47*  47* 54* 62* 59*   Neut # K/uL 0.7* 1.1* 1.8  < > 2.6  < > 3.2 7.0* 17.8* 29.0* 32.8* 38.7* 27.1*   Lymph # K/uL 0.1* 0.1*  0.1*  < > 0.1*  < > 0.0* 0.2* 0.4* 1.1* 2.2 4.5* 2.6   Mono # K/uL 0.0* 0.0* 0.0*  < > 0.0*  < > 0.0* 0.0* 0.0* 0.3 0.0* 1.4* 0.4   Eos # K/uL 0.0 0.0 0.0  < > 0.0  < > 0.0 0.0 0.0 0.0 0.4 0.0 0.0   Baso # K/uL 0.0 0.0 0.0  < > 0.0  < > 0.0 0.0 0.0 0.0 0.0 0.0 0.0   Seg Neut % 81.7 88.0 89.6  < > 94.7  < > 94.6 90.2 87.1 71.9 56.0 49.3 53.5   Lymphocyte % 18.3 11.1 6.9  < > 4.4  < > 0.9 1.8 1.7 2.6 5.3 7.6 6.1   Monocyte % 0.0 0.0 1.0  < > 0.0  < > 0.0 0.0 0.0 0.9 0.0 2.5 0.9   Eosinophil % 0.0 0.0 0.0  < > 0.0  < > 0.0 0.0 0.0 0.0 0.9 0.0 0.0   Basophil % 0.0 0.0 0.0  < > 0.0  < > 0.0 0.0 0.0 0.0 0.0 0.0 0.0   Bands %  --  1  --   --  1  --  _0 18* 20* 8   Myelocyte %  --   --   --   --   --   --  2* 2* 2* 9* 4* 2* 12*   Metamyelocyte %  --   --   --   --   --   --  2* 4* 5* 4* 13* 17* 12*   Promyelocyte %  --   --   --   --   --   --   --   --   --  2* 1*  --  4*   Blasts %  --   --   --   --   --   --   --   --   --   --  2* 2* 3*   < > = values in this interval not displayed.            Lab results: 05/23/15  0139 05/22/15  0247 05/21/15  0242   Sodium 137 137 137   Potassium 5.0 5.1 5.1   Chloride 100 101 98   CO2 _1 UN 29* 31* 33*   Creatinine 0.84 0.86 0.96*   GFR,Caucasian 79 76 67   GFR,Black 91 88 77   Glucose 155* 156* 183*   Calcium 8.6 8.6 8.7           Lab results: 05/23/15  0139   Magnesium 1.5       Lab Results   Component Value Date    ALT 37 (H) 05/23/2015    AST 18 05/23/2015     Lab Results   Component Value Date    ALK 46 05/23/2015     Lab Results   Component Value Date    TB 0.5 05/23/2015     LD   Date Value Ref Range Status   05/23/2015 191 118 - 225 U/L Final     LD, PL   Date Value Ref Range Status   05/09/2015 1146 (H) 118 - 225 U/L Final     LDL Calculated  Date Value Ref Range Status   08/27/2013 113 mg/dL Final     Comment:     REFERENCE RANGE:  < 100 Optimal                  100-129 Near or above optimal                  130-159 Borderline High                  160-189  High                    > 189 Very High         I saw and evaluated the patient. I agree with the NP's findings and plan of care as documented above. Carly Higgins is a 55 yo female with unclassifiable myeloproliferative neoplasm who is day +6 of MAC BUFLU and MUD allogeneic PBSCT. Her post-transplant course has been complicated by oral mucositis.     S: She complained of diarrhea and peri-anal pain. She does have nausea and mouth pain. Appetite low.    O:  Carly Higgins was sitting in her bed in distress related to pain. Oral mucosa with evidence of erythema and ulceration involving the posterior hypopharynx. Clear lungs with no w/c, audible heart sounds with no murmur or gallops, soft and non-tender abdomen, and no leg edema.     Lab.: reviewed.     A/P:  Carly Higgins is a 55 yo female with unclassifiable myeloproliferative neoplasm who is day +6 of MAC BUFLU and MUD allogeneic PBSCT. Her post-transplant course has been complicated by oral and anal mucositis and diarrhea. The plan is to use local symptomatic therapy for anal pain. Continue pain medications for mucositis. We will check her stools for c diff and consider imodium if negative. Otherwise, we will continue IVF hydration as needed. We continue daily labs to assess for electrolyte replacements, tac adjustments and blood transfusion needs. Continue prophylactic antibiotics. Continue tac and give day +6 methotrexate today for GVHD prophylaxis. Encouraged to ambulate on the floor and to keep adequate PO intake.     Karilyn Cota, MD

## 2015-05-23 NOTE — Progress Notes (Signed)
Comfort - BMT-related     Diarrhea controlled or eliminated Maintaining     Without infection Maintaining        Graft Versus Host Disease (GVHD) Bundle     Without rash Maintaining     Stool output within parameters (</= 553ml in 24 h) Maintaining     Total bilirubin </= 2 Maintaining        Mobility     Patient's functional status is maintained or improved Maintaining        Pain/Comfort     Patient's pain or discomfort is manageable Maintaining        Psychosocial     Demonstrates ability to cope with illness Maintaining        Safety     Patient will remain free of falls Maintaining          Comfort - BMT-related     Nausea controlled or eliminated Progressing towards goal        Nutrition     Patient's nutritional status is maintained or improved Progressing towards goal          (0700-1500) Pt. AVSS on room air, A&O x3. Pt received PO PRN ativan x1 for nausea with minimal effect. Pt received PRN phenergan x1 for nausea with minimal effect. Pt received PRN TUMS for heartburn with moderate effect. Pt received sucralfate x1 for abdominal cramping with moderate effect. Pt complained of having lots of diarrhea but stool is soft and formed. Fall prevention teaching reinforced with patient, patient verbalized understanding.  Will continue to monitor and provide handoff to oncoming nurse for continuity of care.

## 2015-05-23 NOTE — Interdisciplinary Rounds (Signed)
Interdisciplinary Rounds Note    Date: 05/23/2015   Time: 7:17 AM   Attendance:  Attending, PA, Charge RN    Admit Date/Time:  05/11/2015  2:02 PM    Principal Problem: <principal problem not specified>  Problem List:   Patient Active Problem List    Diagnosis Date Noted    Myeloproliferative disease 05/11/2015    MPN (myeloproliferative neoplasm) 01/19/2014    Leukocytosis 09/22/2013       The patient's problem list and interdisciplinary care plan was reviewed.    Discharge Planning                 *Does patient currently have home care services?: No     *Current External Services: None                      Plan    05/14/15 Day -3.  Will receive Flu/Bu tonight.  Pt tolerating chemo well.  Continue supportive care.   5/7: doing well, continue supportive care  5/8:  Day -1.  Mild nausea, fatigue.  Continue supportive care.  05/17/2015 Day 0, WBC 3.3, ANC 3.2, Hct 23, plts 36, will receive MUD PBSCs today, continue supportive care.  05/18/2015 Day +1 WBC 3.9, ANC 3.7, hct 21, plts 52K, received MUD PBSCs yesterday, tolerated well, MTX given per protocol, continue supportive care.  05/19/15:  Day +2.  Counts dropping with ANC 2600 today.  Glucose control with BG's and SS insulin coverage d/t steroids.  Continue supportive care.  05/20/15 D +3  Labs stable/falling. Multiple episodes of vomiting yesterday, abd discomfort.   Continue supportive care.  5/13: Day +4. BMX for mucositis. Dizzy- 500 ml bolus and add daily prn bolus. Helmet/assist out of bed.  05/22/15: Day +5 today, c/o rectal pain after having diarrhea, given topicals to alleviate burning discomfort, feeling better today. Continue supportive care.  05/23/15:  Day +6.  Mild complaints of nausea and diarrhea.  Check stool for C-Diff and if negative will start imodium.  Complaining of rectal pain - adjust medications to control discomfort.  Due fpr MTX today.  ANC 700,  Plt 7 K and hct 19 - transfused with both products today. Continue supportive  care.            Anticipated Discharge Date:  Discharge Disposition: Home, choiced to [facility]

## 2015-05-24 ENCOUNTER — Other Ambulatory Visit: Payer: Self-pay | Admitting: Internal Medicine

## 2015-05-24 DIAGNOSIS — D471 Chronic myeloproliferative disease: Secondary | ICD-10-CM

## 2015-05-24 LAB — CBC AND DIFFERENTIAL
Baso # K/uL: 0 10*3/uL (ref 0.0–0.1)
Basophil %: 0 %
Eos # K/uL: 0 10*3/uL (ref 0.0–0.4)
Eosinophil %: 0 %
Hematocrit: 17 % — CL (ref 34–45)
Hemoglobin: 5.8 g/dL — ABNORMAL LOW (ref 11.2–15.7)
Lymph # K/uL: 0.2 10*3/uL — ABNORMAL LOW (ref 1.2–3.7)
Lymphocyte %: 25.4 %
MCH: 29 pg/cell (ref 26–32)
MCHC: 35 g/dL (ref 32–36)
MCV: 84 fL (ref 79–95)
Mono # K/uL: 0 10*3/uL — ABNORMAL LOW (ref 0.2–0.9)
Monocyte %: 1.6 %
Neut # K/uL: 0.4 10*3/uL — ABNORMAL LOW (ref 1.6–6.1)
Nucl RBC # K/uL: 0 10*3/uL (ref 0.0–0.0)
Nucl RBC %: 0 /100 WBC (ref 0.0–0.2)
Platelets: 9 10*3/uL — CL (ref 160–370)
RBC: 2 MIL/uL — ABNORMAL LOW (ref 3.9–5.2)
RDW: 16.9 % — ABNORMAL HIGH (ref 11.7–14.4)
Seg Neut %: 71.4 %
WBC: 0.6 10*3/uL — ABNORMAL LOW (ref 4.0–10.0)

## 2015-05-24 LAB — TYPE AND SCREEN
ABO RH Blood Type: A POS
Antibody Screen: NEGATIVE

## 2015-05-24 LAB — POCT GLUCOSE
Glucose POCT: 144 mg/dL — ABNORMAL HIGH (ref 60–99)
Glucose POCT: 170 mg/dL — ABNORMAL HIGH (ref 60–99)
Glucose POCT: 166 mg/dL — ABNORMAL HIGH (ref 60–99)
Glucose POCT: 130 mg/dL — ABNORMAL HIGH (ref 60–99)

## 2015-05-24 LAB — BASIC METABOLIC PANEL
Anion Gap: 14 (ref 7–16)
CO2: 23 mmol/L (ref 20–28)
Calcium: 8.7 mg/dL (ref 8.6–10.2)
Chloride: 101 mmol/L (ref 96–108)
Creatinine: 0.83 mg/dL (ref 0.51–0.95)
GFR,Black: 92 *
GFR,Caucasian: 80 *
Glucose: 129 mg/dL — ABNORMAL HIGH (ref 60–99)
Lab: 22 mg/dL — ABNORMAL HIGH (ref 6–20)
Potassium: 5.3 mmol/L — ABNORMAL HIGH (ref 3.3–5.1)
Sodium: 138 mmol/L (ref 133–145)

## 2015-05-24 LAB — PLATELETS,PHERESIS
Coded Blood type: 6200
Component blood type: A POS
Dispense status: TRANSFUSED

## 2015-05-24 LAB — DIFF MANUAL
Diff Based On: 63 CELLS
React Lymph %: 2 % (ref 0–6)

## 2015-05-24 LAB — RED BLOOD CELLS
Coded Blood type: 6200
Component blood type: A POS
Dispense status: TRANSFUSED

## 2015-05-24 LAB — MAGNESIUM: Magnesium: 1.5 mEq/L (ref 1.3–2.1)

## 2015-05-24 LAB — TACROLIMUS LEVEL: Tacrolimus: 12.3 ng/mL

## 2015-05-24 MED ORDER — HYDROMORPHONE HCL PF 1 MG/ML IJ SOLN *WRAPPED*
0.5000 mg | INTRAMUSCULAR | Status: DC | PRN
Start: 2015-05-25 — End: 2015-05-25
  Administered 2015-05-24 – 2015-05-25 (×3): 0.5 mg via INTRAVENOUS
  Filled 2015-05-24 (×3): qty 1

## 2015-05-24 MED ORDER — HYDROMORPHONE HCL PF 1 MG/ML IJ SOLN *WRAPPED*
0.5000 mg | INTRAMUSCULAR | Status: DC | PRN
Start: 2015-05-24 — End: 2015-05-24
  Administered 2015-05-24 (×3): 0.5 mg via INTRAVENOUS
  Filled 2015-05-24 (×4): qty 1

## 2015-05-24 MED ORDER — OXYCODONE HCL 5 MG PO TABS *I*
5.0000 mg | ORAL_TABLET | ORAL | Status: DC | PRN
Start: 2015-05-24 — End: 2015-05-25
  Filled 2015-05-24: qty 1

## 2015-05-24 NOTE — Interdisciplinary Rounds (Signed)
Interdisciplinary Rounds Note    Date: 05/24/2015   Time: 9:44 AM   Attendance:  Attending, PA, Charge RN    Admit Date/Time:  05/11/2015  2:02 PM    Principal Problem: <principal problem not specified>  Problem List:   Patient Active Problem List    Diagnosis Date Noted    Myeloproliferative disease 05/11/2015    MPN (myeloproliferative neoplasm) 01/19/2014    Leukocytosis 09/22/2013       The patient's problem list and interdisciplinary care plan was reviewed.    Discharge Planning                 *Does patient currently have home care services?: No     *Current External Services: None                      Plan    05/14/15 Day -3.  Will receive Flu/Bu tonight.  Pt tolerating chemo well.  Continue supportive care.   5/7: doing well, continue supportive care  5/8:  Day -1.  Mild nausea, fatigue.  Continue supportive care.  05/17/2015 Day 0, WBC 3.3, ANC 3.2, Hct 23, plts 36, will receive MUD PBSCs today, continue supportive care.  05/18/2015 Day +1 WBC 3.9, ANC 3.7, hct 21, plts 52K, received MUD PBSCs yesterday, tolerated well, MTX given per protocol, continue supportive care.  05/19/15:  Day +2.  Counts dropping with ANC 2600 today.  Glucose control with BG's and SS insulin coverage d/t steroids.  Continue supportive care.  05/20/15 D +3  Labs stable/falling. Multiple episodes of vomiting yesterday, abd discomfort.   Continue supportive care.  5/13: Day +4. BMX for mucositis. Dizzy- 500 ml bolus and add daily prn bolus. Helmet/assist out of bed.  05/22/15: Day +5 today, c/o rectal pain after having diarrhea, given topicals to alleviate burning discomfort, feeling better today. Continue supportive care.  05/23/15:  Day +6.  Mild complaints of nausea and diarrhea.  Check stool for C-Diff and if negative will start imodium.  Complaining of rectal pain - adjust medications to control discomfort.  Due fpr MTX today.  ANC 700,  Plt 7 K and hct 19 - transfused with both products today. Continue supportive care.  05/24/2015 Day  +7 WBC 0.6, ANC 0.4, hct 17, plts 9K, transfused with plts, will receive PRBCs, complaining of mucusitis pain, not eating, encouraged pt to take pain medication, also needs encouragement to get OOB, otherwise continue supportive care.          Anticipated Discharge Date:  Discharge Disposition: Home, choiced to [facility]

## 2015-05-24 NOTE — Progress Notes (Signed)
Comfort - BMT-related     Diarrhea controlled or eliminated Maintaining     Without infection Maintaining        Graft Versus Host Disease (GVHD) Bundle     Without rash Maintaining     Stool output within parameters (</= 545ml in 24 h) Maintaining     Total bilirubin </= 2 Maintaining        Mobility     Patient's functional status is maintained or improved Maintaining        Pain/Comfort     Patient's pain or discomfort is manageable Maintaining        Psychosocial     Demonstrates ability to cope with illness Maintaining        Safety     Patient will remain free of falls Maintaining          Comfort - BMT-related     Nausea controlled or eliminated Progressing towards goal        Nutrition     Patient's nutritional status is maintained or improved Progressing towards goal          (0700-1500) Pt. AVSS on room air, A&O x3. Pt received PRN phenergan x1 for nausea with moderate effect. Pt received PRN Carafate for abdominal cramping with positive effect. Pt received PRN TUMS x1 for heartburn with positive effect. Pt received IV PRN dilaudid x1 for mucositis pain with moderate effect. Pt received PRN ativan x1 for nausea with moderate effect. Pt received PO PRN tylenol before blood transfusion per protocol. Fall prevention teaching reinforced with patient, patient verbalized understanding. Will continue to monitor and provide handoff to oncoming nurse for continuity of care.

## 2015-05-24 NOTE — Progress Notes (Addendum)
Blood & Marrow Transplant Program APP Progress Note    CC   Carly Higgins is a 55 y.o. 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 By [employer] testing, she has mutations of TET2, ASXL1, CCND2, GATA2, SRSF2.     Admitted for MUD allogeneic PBSC transplant.     Length of stay  LOS: 13 days     Interval History   Day +7  Rectal pain controlled with prn tucks, anusol and oxy  C/o a lot of watery loose stool- ordered c.diff; no sample yet  Counts continuing to drop; Received 1unit prbc and 1 unit plt again today     Review of Systems   A comprehensive review of 13 systems was performed; pertinent positives checked below:  _0  fevers  _1  fast pulse _2  fatigue   _3  chills _4  chest pain _5  dysuria    _6  dry eyes _7  nausea/emesis  _8  rash   _9  ear pain _10  abdominal cramping _11  skin changes   _12  sores in mouth _13  abdominal discomfort _14  pain   _15  sore throat _16  loose stool, non-formed _17  none of the above   _18  cough  _19  diarrhea, watery    _20  sputum production  _21  weight loss    _22  shortness of breath _23  insufficient oral intake      Vital Signs and I&O   Temp:  [35.9 C (96.6 F)-37 C (98.6 F)] 36.4 C (97.5 F)  Heart Rate:  [81-91] 84  Resp:  [18-20] 18  BP: (119-149)/(57-81) 119/57    Intake/Output Summary (Last 24 hours) at 05/24/15 4540  Last data filed at 05/24/15 9811   Gross per 24 hour   Intake          1619.88 ml   Output             2850 ml   Net         -1230.12 ml        Physical Exam     Head:  Normocephalic, atraumatic   Eyes:  Conjunctiva/corneas clear   Throat: Oral mucosa pink & dry; erythematous lesions in posterior pharynx   Lungs:   CTA B/L, effort unlabored    Heart:  RRR, no murmur/rub/gallop   Abdomen:   Soft, nondistended, diffusely tender, BS present; anus and surrounding erythematous without obvious hemorrhoid, fissure, abscess    Extremities: No edema   Pulses: 2+ and symmetric   Skin: No rashes or lesions   Neurologic:  A&O X 3, CMS intact   Central venous access: IVAD - C/D/I      Assessment & Plan   Carly Higgins is a 55 yo female with unclassifiable myeloproliferative neoplasm.  By [employer] testing, she has mutations of TET2, ASXL1, CCND2, GATA2, SRSF2. Admitted for MUD allogeneic PBSCT.     Atypical MPN  Completed Decitabine 74m/m2 IV daily x 5 days - Cycle # 1 12/15/14  On intermittent/dose adjusted hydroxyurea at admission - discontinued 5/5 in setting of falling WBC     Day -5 thru -2 (5/4-5/7):  Flu 447mm2, Bu 13022m2 - Busulfan dose adjusted to 285m8mr last 2 doses (from 244mg43may -1 (5/8): Rest day  Day 0 (5/9): MUD (10/10 HLA, female) allo PBSCT, ABO: A+/A+, CMV: neg/neg     Today is BMT day +7    Pancytopenia  Transfuse 1 unit RBCs for hematocrit <21% and/or transfuse 1 unit platelets for platelet count <11,000 OR transfuse 1 unit  platelets for platelet count <20,000 if bleeding or fever.  Helmet for safety during periods of thrombocytopenia, per hospital policy    DVT ppx  Enoxaparin - on hold for plts <30K     ID  At FIRST temperature spike >37.9   Order cultures  Obtain blood cultures (1 set of bactacs) from each central line port & one set of blood cultures drawn by venipuncture   Begin broad spectrum antibiotics with cefepime and flagyl (follow febrile neutropenia SOP)    Viral prophylaxis with acyclovir  Fungal prophylaxis with fluconazole    CV/pulm  Albuterol, Duonebs PRN wheezing     Endo  Lispro SS added for rising BGs, likely due to steroids, can stop when normalized    GVHD  Planned GVHD prophylaxis with tacrolimus and methotrexate  Tacro by continuous IV infusion beginning on day-1 via PIV  Target tacrolimus level 8-12    Methotrexate will dose equivalent leucovorin will be adminstered as follows:   15 mg/m2 on day +1 (>24 hrs from stem cell infusion) (full dose given)  10 mg/m2 on days +3, +6, and +11     Renal  Creatinine at admission was 0.7, stable  Encouraged PO intake   Daily 1L  bolus, hold for PO intake >1500cc   K 5.3 today, bolus will help, monitor daily     Electrolyte imbalance  Replace electrolytes as to the following parameters  Hypokalemia, supplement when serum K <3.6  Hypomagnesemia, supplement when serum Mag <1.5  Hypocalcemia, supplement when corrected calcium <8.9     At risk for malnutrition/hypoalbuminemia  Regular diet  Albumin level on admission 4.3  Nutrition following     GI  ATC Zofran   PRN Phenergan, Ativan, simethicone   Protonix BID for GERD, stress ulcer ppx  PRN Tums, carafate for GERD   PRN Tucks, Anusol for rectal/anal pain  C. Diff sent 5/15- awaiting results     VOD/SOS  Moderate risk with ablative BU/Flu  Ursodiol ppx    Mucositis  WHO Grade 2   Cont good oral care, normal saline rinses  PRN BMX, oxy    At risk for deconditioning   Encouraged physical activity, ambulation  Consult PT if needed    Psychosocial  Cont emotional support     Smoking Cessation  Offered nicotine replacement & [place] [facility]ess[facility]; patient declining at this time    Dispo planning  Anticipate discharge following engraftment and clinical stability  BMT consent signed  BMT attending [name]  Primary care physician [name]  RN coordinator Anne/Sharon  Lives in [place]    Patient seen and discussed with Dr. Ula Lingo, MD    Lab Data      Lab results: 05/24/15  6440 05/23/15  0139 05/22/15  3474  05/19/15  0225  05/17/15  0345 05/16/15  2595 05/15/15  0112 05/14/15  0106 05/13/15  0107 05/12/15  0527 05/11/15  1649   WBC 0.6* 0.8* 1.2*  < > 2.7*  < > 3.3* 7.6 19.5* 35.4* 44.3* 56.1* 43.7*   Hemoglobin 5.8* 6.5* 6.0*  < > 7.5*  < > 7.6* 8.5* 8.3* 7.9* 6.5* 7.2* 7.1*   Hematocrit 17* 19* 17*  < > 22*  < > 23* 26* 26* 25* 21* 23* 22*   RBC 2.0* 2.3* 2.1*  < > 2.6*  < > 2.6* 3.0* 2.9* 2.7* 2.2* 2.5* 2.4*   Platelets 9* 7* 11*  < > 37*  < >  36* 39* 47* 47* 54* 62* 59*   Neut # K/uL 0.4* 0.7* 1.1*  < > 2.6  < > 3.2 7.0* 17.8* 29.0* 32.8* 38.7*  27.1*   Lymph # K/uL 0.2* 0.1* 0.1*  < > 0.1*  < > 0.0* 0.2* 0.4* 1.1* 2.2 4.5* 2.6   Mono # K/uL 0.0* 0.0* 0.0*  < > 0.0*  < > 0.0* 0.0* 0.0* 0.3 0.0* 1.4* 0.4   Eos # K/uL 0.0 0.0 0.0  < > 0.0  < > 0.0 0.0 0.0 0.0 0.4 0.0 0.0   Baso # K/uL 0.0 0.0 0.0  < > 0.0  < > 0.0 0.0 0.0 0.0 0.0 0.0 0.0   Seg Neut % 71.4 81.7 88.0  < > 94.7  < > 94.6 90.2 87.1 71.9 56.0 49.3 53.5   Lymphocyte % 25.4 18.3 11.1  < > 4.4  < > 0.9 1.8 1.7 2.6 5.3 7.6 6.1   Monocyte % 1.6 0.0 0.0  < > 0.0  < > 0.0 0.0 0.0 0.9 0.0 2.5 0.9   Eosinophil % 0.0 0.0 0.0  < > 0.0  < > 0.0 0.0 0.0 0.0 0.9 0.0 0.0   Basophil % 0.0 0.0 0.0  < > 0.0  < > 0.0 0.0 0.0 0.0 0.0 0.0 0.0   Bands %  --   --  1  --  1  --  _0 18* 20* 8   Myelocyte %  --   --   --   --   --   --  2* 2* 2* 9* 4* 2* 12*   Metamyelocyte %  --   --   --   --   --   --  2* 4* 5* 4* 13* 17* 12*   Promyelocyte %  --   --   --   --   --   --   --   --   --  2* 1*  --  4*   Blasts %  --   --   --   --   --   --   --   --   --   --  2* 2* 3*   < > = values in this interval not displayed.            Lab results: 05/24/15  0336 05/23/15  0139 05/22/15  0247   Sodium 138 137 137   Potassium 5.3* 5.0 5.1   Chloride 101 100 101   CO2 _1 UN 22* 29* 31*   Creatinine 0.83 0.84 0.86   GFR,Caucasian 80 79 76   GFR,Black 92 91 88   Glucose 129* 155* 156*   Calcium 8.7 8.6 8.6           Lab results: 05/24/15  0336   Magnesium 1.5       Lab Results   Component Value Date    ALT 37 (H) 05/23/2015    AST 18 05/23/2015     Lab Results   Component Value Date    ALK 46 05/23/2015     Lab Results   Component Value Date    TB 0.5 05/23/2015     LD   Date Value Ref Range Status   05/23/2015 191 118 - 225 U/L Final     LD, PL   Date Value Ref Range Status   05/09/2015 1146 (H) 118 - 225 U/L Final  LDL Calculated   Date Value Ref Range Status   08/27/2013 113 mg/dL Final     Comment:     REFERENCE RANGE:  < 100 Optimal                  100-129 Near or above optimal                  130-159  Borderline High                  160-189 High                    > 189 Very High         I saw and evaluated the patient. I agree with the resident's findings and plan of care as documented above. Carly Higgins is a 55 yo female with unclassifiable myeloproliferative neoplasm who is day +7 of MAC BUFLU and MUD allogeneic PBSCT. Her post-transplant course has been complicated by oral mucositis.     S: Her diarrhea has stopped after she stopped taking ensure. her peri-anal pain is better controlled. She does have nausea and mouth pain, both controlled. Appetite low.    O:  Carly Higgins was sitting in her bed in less distress today. Oral mucosa with evidence of erythema and ulceration involving the posterior hypopharynx. Clear lungs with no w/c, audible heart sounds with no murmur or gallops, soft and non-tender abdomen, and no leg edema.     Lab.: reviewed.     A/P:  Carly Higgins is a 55 yo female with unclassifiable myeloproliferative neoplasm who is day +7 of MAC BUFLU and MUD allogeneic PBSCT. Her post-transplant course has been complicated by oral and anal mucositis and diarrhea. The plan is to use local symptomatic therapy for anal pain and to continue oral pain medications for mucositis. Otherwise, we will continue IVF hydration as needed. We continue daily labs to assess for electrolyte replacements, tac adjustments and blood transfusion needs. Continue prophylactic antibiotics. Continue tac and give methotrexate as planned on days 1,3, 6, and 11 post-transplant for GVHD prophylaxis. Encouraged to ambulate on the floor and to keep adequate PO intake.

## 2015-05-25 LAB — COMPREHENSIVE METABOLIC PANEL
ALT: 41 U/L — ABNORMAL HIGH (ref 0–35)
AST: 23 U/L (ref 0–35)
Albumin: 3.5 g/dL (ref 3.5–5.2)
Alk Phos: 58 U/L (ref 35–105)
Anion Gap: 13 (ref 7–16)
Bilirubin,Total: 1.2 mg/dL (ref 0.0–1.2)
CO2: 23 mmol/L (ref 20–28)
Calcium: 8.6 mg/dL (ref 8.6–10.2)
Chloride: 99 mmol/L (ref 96–108)
Creatinine: 0.77 mg/dL (ref 0.51–0.95)
GFR,Black: 101 *
GFR,Caucasian: 87 *
Glucose: 117 mg/dL — ABNORMAL HIGH (ref 60–99)
Lab: 17 mg/dL (ref 6–20)
Potassium: 4.9 mmol/L (ref 3.3–5.1)
Sodium: 135 mmol/L (ref 133–145)
Total Protein: 5.6 g/dL — ABNORMAL LOW (ref 6.3–7.7)

## 2015-05-25 LAB — CBC AND DIFFERENTIAL
Baso # K/uL: 0 10*3/uL (ref 0.0–0.1)
Basophil %: 0 %
Eos # K/uL: 0 10*3/uL (ref 0.0–0.4)
Eosinophil %: 0 %
Hematocrit: 20 % — ABNORMAL LOW (ref 34–45)
Hemoglobin: 6.9 g/dL — ABNORMAL LOW (ref 11.2–15.7)
Lymph # K/uL: 0.1 10*3/uL — ABNORMAL LOW (ref 1.2–3.7)
Lymphocyte %: 47.9 %
MCH: 29 pg/cell (ref 26–32)
MCHC: 35 g/dL (ref 32–36)
MCV: 83 fL (ref 79–95)
Mono # K/uL: 0 10*3/uL — ABNORMAL LOW (ref 0.2–0.9)
Monocyte %: 0 %
Neut # K/uL: 0.1 10*3/uL — ABNORMAL LOW (ref 1.6–6.1)
Nucl RBC # K/uL: 0 10*3/uL (ref 0.0–0.0)
Nucl RBC %: 0 /100 WBC (ref 0.0–0.2)
Platelets: 15 10*3/uL — CL (ref 160–370)
RBC: 2.4 MIL/uL — ABNORMAL LOW (ref 3.9–5.2)
RDW: 16.1 % — ABNORMAL HIGH (ref 11.7–14.4)
Seg Neut %: 52.1 %
WBC: 0.2 10*3/uL — ABNORMAL LOW (ref 4.0–10.0)

## 2015-05-25 LAB — RED BLOOD CELLS
Coded Blood type: 6200
Component blood type: A POS
Dispense status: TRANSFUSED

## 2015-05-25 LAB — PLATELETS,PHERESIS
Coded Blood type: 6200
Component blood type: A POS
Dispense status: TRANSFUSED

## 2015-05-25 LAB — DIFF MANUAL: Diff Based On: 48 CELLS

## 2015-05-25 LAB — LACTATE DEHYDROGENASE: LD: 186 U/L (ref 118–225)

## 2015-05-25 LAB — PHOSPHORUS: Phosphorus: 5 mg/dL — ABNORMAL HIGH (ref 2.7–4.5)

## 2015-05-25 LAB — POCT GLUCOSE
Glucose POCT: 119 mg/dL — ABNORMAL HIGH (ref 60–99)
Glucose POCT: 145 mg/dL — ABNORMAL HIGH (ref 60–99)
Glucose POCT: 110 mg/dL — ABNORMAL HIGH (ref 60–99)
Glucose POCT: 151 mg/dL — ABNORMAL HIGH (ref 60–99)

## 2015-05-25 LAB — TACROLIMUS LEVEL: Tacrolimus: 8.7 ng/mL

## 2015-05-25 LAB — MAGNESIUM: Magnesium: 1.4 mEq/L (ref 1.3–2.1)

## 2015-05-25 MED ORDER — HYDROMORPHONE HCL PF 1 MG/ML IJ SOLN *WRAPPED*
0.5000 mg | Freq: Once | INTRAMUSCULAR | Status: AC
Start: 2015-05-25 — End: 2015-05-25
  Administered 2015-05-25: 0.5 mg via INTRAVENOUS
  Filled 2015-05-25: qty 1

## 2015-05-25 MED ORDER — HYDROMORPHONE PCA 1 MG/ML *WRAPPED*
INTRAMUSCULAR | Status: DC
Start: 2015-05-25 — End: 2015-05-27
  Filled 2015-05-25 (×4): qty 25

## 2015-05-25 MED ORDER — SODIUM CHLORIDE 0.9 % IV SOLN WRAPPED *I*
50.0000 mL/h | Status: DC
Start: 2015-05-25 — End: 2015-05-29
  Administered 2015-05-25 – 2015-05-27 (×3): 75 mL/h via INTRAVENOUS
  Administered 2015-05-28 – 2015-05-29 (×2): 100 mL/h via INTRAVENOUS
  Administered 2015-05-29: 50 mL/h via INTRAVENOUS

## 2015-05-25 NOTE — Interdisciplinary Rounds (Signed)
Interdisciplinary Rounds Note    Date: 05/25/2015   Time: 8:30 AM   Attendance:  Attending, PA, Charge RN    Admit Date/Time:  05/11/2015  2:02 PM    Principal Problem: <principal problem not specified>  Problem List:   Patient Active Problem List    Diagnosis Date Noted    Myeloproliferative disease 05/11/2015    MPN (myeloproliferative neoplasm) 01/19/2014    Leukocytosis 09/22/2013       The patient's problem list and interdisciplinary care plan was reviewed.    Discharge Planning                 *Does patient currently have home care services?: No     *Current External Services: None                      Plan    05/14/15 Day -3.  Will receive Flu/Bu tonight.  Pt tolerating chemo well.  Continue supportive care.   5/7: doing well, continue supportive care  5/8:  Day -1.  Mild nausea, fatigue.  Continue supportive care.  05/17/2015 Day 0, WBC 3.3, ANC 3.2, Hct 23, plts 36, will receive MUD PBSCs today, continue supportive care.  05/18/2015 Day +1 WBC 3.9, ANC 3.7, hct 21, plts 52K, received MUD PBSCs yesterday, tolerated well, MTX given per protocol, continue supportive care.  05/19/15:  Day +2.  Counts dropping with ANC 2600 today.  Glucose control with BG's and SS insulin coverage d/t steroids.  Continue supportive care.  05/20/15 D +3  Labs stable/falling. Multiple episodes of vomiting yesterday, abd discomfort.   Continue supportive care.  5/13: Day +4. BMX for mucositis. Dizzy- 500 ml bolus and add daily prn bolus. Helmet/assist out of bed.  05/22/15: Day +5 today, c/o rectal pain after having diarrhea, given topicals to alleviate burning discomfort, feeling better today. Continue supportive care.  05/23/15:  Day +6.  Mild complaints of nausea and diarrhea.  Check stool for C-Diff and if negative will start imodium.  Complaining of rectal pain - adjust medications to control discomfort.  Due fpr MTX today.  ANC 700,  Plt 7 K and hct 19 - transfused with both products today. Continue supportive care.  05/24/2015 Day  +7 WBC 0.6, ANC 0.4, hct 17, plts 9K, transfused with plts, will receive PRBCs, complaining of mucusitis pain, not eating, encouraged pt to take pain medication, also needs encouragement to get OOB, otherwise continue supportive care.  05/25/2015 Day +8 WBC 0.2, ANC 0.1, hct 20, plts 15K, complaining of increased pain for which the dilaudid helps but does not last per pt, starting PCA, continue other supportive measures.          Anticipated Discharge Date:  Discharge Disposition: Home, choiced to [facility]

## 2015-05-25 NOTE — Plan of Care (Signed)
Comfort - BMT-related     Nausea controlled or eliminated Maintaining     Diarrhea controlled or eliminated Maintaining     Without infection Maintaining        Graft Versus Host Disease (GVHD) Bundle     Without rash Maintaining     Stool output within parameters (</= 511ml in 24 h) Maintaining     Total bilirubin </= 2 Maintaining        Psychosocial     Demonstrates ability to cope with illness Maintaining        Safety     Patient will remain free of falls Maintaining          Mobility     Patient's functional status is maintained or improved Progressing towards goal        Nutrition     Patient's nutritional status is maintained or improved Progressing towards goal        Pain/Comfort     Patient's pain or discomfort is manageable Progressing towards goal            Pt. Has been AVSS for me besides intermittent hypertension, which has since resolved. Pt.'s morning tacrolimus bag was left hanging for a few hours expired (okay per Su Ley, Pharm D) until pt.'s new PIV was placed. Pt. Got started on a dilaudid PCA 1mg /mL with moderate effect, continuous dose was increased later in the day and pt. seems to be tolerating this alright-will continue to assess if pain improves with this new dose. Monitor patient for increased fatigue. Pt. Passed off to Rolan Lipa, RN at [phone].    Levonne Spiller, RN

## 2015-05-25 NOTE — Plan of Care (Signed)
Comfort - BMT-related     Diarrhea controlled or eliminated Maintaining     Without infection Maintaining        Graft Versus Host Disease (GVHD) Bundle     Without rash Maintaining     Stool output within parameters (</= 542ml in 24 h) Maintaining     Total bilirubin </= 2 Maintaining        Nutrition     Patient's nutritional status is maintained or improved Maintaining        Psychosocial     Demonstrates ability to cope with illness Maintaining        Safety     Patient will remain free of falls Maintaining          Comfort - BMT-related     Nausea controlled or eliminated Progressing towards goal        Mobility     Patient's functional status is maintained or improved Progressing towards goal        Pain/Comfort     Patient's pain or discomfort is manageable Progressing towards goal        Pt avss throughout shift. Received multiple doses of IV Dilaudid for mouth pain with short term positive effect. Pt currently receiving 1 unit of RBC's. Consent present in chart, pre-meds given per order and RBC's checked by 2 RN's at bedside. Pt tolerating well at this time. RBC's to finish on next shift.

## 2015-05-25 NOTE — Plan of Care (Signed)
Comfort - BMT-related    • Diarrhea controlled or eliminated Maintaining    • Without infection Maintaining        Graft Versus Host Disease (GVHD) Bundle    • Without rash Maintaining    • Stool output within parameters (</= 500ml in 24 h) Maintaining    • Total bilirubin </= 2 Maintaining        Psychosocial    • Demonstrates ability to cope with illness Maintaining        Safety    • Patient will remain free of falls Maintaining          Comfort - BMT-related    • Nausea controlled or eliminated Progressing towards goal        Mobility    • Patient's functional status is maintained or improved Progressing towards goal        Nutrition    • Patient's nutritional status is maintained or improved Progressing towards goal        Pain/Comfort    • Patient's pain or discomfort is manageable Progressing towards goal

## 2015-05-25 NOTE — Consults (Signed)
Medical Nutrition Therapy - Follow-up    Admit Date: 05/11/2015    Reason for consult: eval and treat    Patient Summary:   55 year old woman with unclassifiable myeloproliferative neoplasm causing leukocytosis with some myeloid immaturity. By [employer] testing, she has mutations of TET2, ASXL1, CCND2, GATA2, SRSF2.  Carly Higgins is now admitted for an unrelated donor transplant PBSCT with Day 0 = 05/17/15 after conditioning with busulfan/fludarabine.    BMT course:  5/12 multiple episodes of vomiting, abd discomfort  5/14 diarrhea, rectal pain  5/15 still with some nausea and diarrhea  5/17 significant mucositis    Past Medical History:   Diagnosis Date    GERD (gastroesophageal reflux disease)     Shortness of breath      Past Surgical History:   Procedure Laterality Date    ROTATOR CUFF REPAIR Right         Pertinent Social Hx: lives locally, divorced.    Pertinent Meds: reviewed, SS insulin, tacro gtt  .  Pertinent Labs: reviewed,   WBC 0.2, ANC 0.1, plt 15   electrolytes, glucose, creatinine, LFTs all within normal or acceptable levels for current medical status    Reviewed I/O's   Oral intake: dropping off the past couple days  Emesis x1 5/15  Stool:  400 ml plus 4x unmeasured on 5/14, x1 5/15, none yesterday     Access: DL IVAD    Nutrition Hx: some days during chemo when she didn't feel like eating much, but says "not too many".    Food allergies: NKFA    Current diet: regular  Supplements: Ensure Enlive TID    Nutrition Focused Physical Exam:  Edema: none  Abdomen: nausea, hemorrhoids  Oral score:  15  Skin: WDL  Body fat stores: adequate  Lean body mass stores: adequate    Anthropometrics:  Height: 169.1 cm (5' 6.58") (with Hervey Ard RN)    Current Weight: 112.5 kg (248 lb);   Admit weight:  118.6 kg  UBW: 113-118 kg  Ideal Body Weight: 60.2 kg + 10%  Body mass index is 39.34 kg/(m^2). using current weight;  Obese grade 2    Weight Hx:   Stable in the 113-118 kg range over the past several years    Estimated  Nutrient Needs: (Based on 60.2 kg)    1800-2100 kcal/day (30-35 kcal/kg)   90-120 g protein/day (1.5-2.0 g/kg)    2100 mL fluid/day (35 mL/kg)      Nutrition Assessment and Diagnosis:     Patient continues ~6 kg below admit weight.    Today c/o significant mucositis, says can barely swallow water - looking forward to PCA for pain control.    Was taking Ensure Enlive but thinks it gave her diarrhea, and now can't swallow it anyway.  Declines trying Ensure Clear due to mouth/throat pain.    Malnutrition Status:   Does not currently fit criteria for either moderate or severe protein calorie malnutrition    Nutrition Intervention:   1. continue regular diet, encourage intake  2. D/c Ensure Enlive as patient requests    Nutrition Monitoring/Evaluation:   1. Will monitor diet tolerance and intake, nutrition-related labs, weight trend, BM pattern.    2. Nutrition to follow up per high nutrition risk protocol.    Rose Fillers, [place], [place], [place] pager [phone]

## 2015-05-25 NOTE — Progress Notes (Addendum)
Blood & Marrow Transplant Program APP Progress Note    CC   Carly Higgins is a 55 y.o. 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 By [employer] testing, she has mutations of TET2, ASXL1, CCND2, GATA2, SRSF2.     Admitted for MUD allogeneic PBSC transplant.     Length of stay  LOS: 14 days     Interval History   Worsening mucositis pain; initiated dilaudid PCA for pain control.  Has daily NS bolus prn to supplement if poor PO intake.  Tacro level 8.7 no dose adjustment.  PRBCs for HCT 20.       Review of Systems   A comprehensive review of 13 systems was performed; pertinent positives checked below:  []  fevers  []  fast pulse [x]  fatigue   []  chills []  chest pain []  dysuria    []  dry eyes []  nausea/emesis  []  rash   []  ear pain []  abdominal cramping []  skin changes   []  sores in mouth []  abdominal discomfort [x]  pain   [x]  sore throat []  loose stool, non-formed []  none of the above   []  cough  []  diarrhea, watery    []  sputum production  []  weight loss    []  shortness of breath [x]  insufficient oral intake      Vital Signs and I&O   Temp:  [36.2 C (97.2 F)-37.4 C (99.3 F)] 36.6 C (97.9 F)  Heart Rate:  [74-97] 83  Resp:  [16-20] 20  BP: (118-156)/(59-93) 121/64    Intake/Output Summary (Last 24 hours) at 05/25/15 1509  Last data filed at 05/25/15 5638   Gross per 24 hour   Intake          1380.14 ml   Output             2800 ml   Net         -1419.86 ml        Physical Exam     Head:  Normocephalic, atraumatic   Eyes:  Conjunctiva/corneas clear   Throat: Oral mucosa pink & dry; erythematous lesions in posterior pharynx   Lungs:   CTA B/L, effort unlabored    Heart:  RRR, no murmur/rub/gallop   Abdomen:   Soft, nondistended, diffusely tender, BS present; anus and surrounding erythematous without obvious hemorrhoid, fissure, abscess    Extremities: No edema   Pulses: 2+ and symmetric   Skin: No rashes or lesions   Neurologic: A&O X 3, CMS intact    Central venous access: IVAD - C/D/I      Assessment & Plan   Carly Higgins is a 55 yo female with unclassifiable myeloproliferative neoplasm.  By [employer] testing, she has mutations of TET2, ASXL1, CCND2, GATA2, SRSF2. Admitted for MUD allogeneic PBSCT.     Atypical MPN  Completed Decitabine 5m/m2 IV daily x 5 days - Cycle # 1 12/15/14  On intermittent/dose adjusted hydroxyurea at admission - discontinued 5/5 in setting of falling WBC     Day -5 thru -2 (5/4-5/7):  Flu 418mm2, Bu 13022m2 - Busulfan dose adjusted to 285m55mr last 2 doses (from 244mg41may -1 (5/8): Rest day  Day 0 (5/9): MUD (10/10 HLA, female) allo PBSCT, ABO: A+/A+, CMV: neg/neg     Today is BMT day +8    Pancytopenia  Transfuse 1 unit RBCs for hematocrit <21% and/or transfuse 1 unit platelets for platelet count <11,000 OR transfuse 1 unit platelets for platelet count <  20,000 if bleeding or fever.  Helmet for safety during periods of thrombocytopenia, per hospital policy    DVT ppx  Enoxaparin - on hold for plts <30K     ID  At FIRST temperature spike >37.9   Order cultures  Obtain blood cultures (1 set of bactacs) from each central line port & one set of blood cultures drawn by venipuncture   Begin broad spectrum antibiotics with cefepime and flagyl (follow febrile neutropenia SOP)    Viral prophylaxis with acyclovir  Fungal prophylaxis with fluconazole    CV/pulm  Albuterol, Duonebs PRN wheezing     Endo  Lispro SS added for rising BGs, likely due to steroids, can stop when normalized    GVHD  Planned GVHD prophylaxis with tacrolimus and methotrexate  Tacro by continuous IV infusion beginning on day-1 via PIV  Target tacrolimus level 8-12    Methotrexate will dose equivalent leucovorin will be adminstered as follows:   15 mg/m2 on day +1 (>24 hrs from stem cell infusion) (full dose given)  10 mg/m2 on days +3, +6, and +11     Renal  Creatinine at admission was 0.7, stable  Encouraged PO intake   Daily 1L bolus, hold for PO  intake >1500cc      Electrolyte imbalance  Replace electrolytes as to the following parameters  Hypokalemia, supplement when serum K <3.6  Hypomagnesemia, supplement when serum Mag <1.5  Hypocalcemia, supplement when corrected calcium <8.9     At risk for malnutrition/hypoalbuminemia  Regular diet  Albumin level on admission 4.3  Nutrition following     GI  ATC Zofran   PRN Phenergan, Ativan, simethicone   Protonix BID for GERD, stress ulcer ppx  PRN Tums, carafate for GERD   PRN Tucks, Anusol for rectal/anal pain      VOD/SOS  Moderate risk with ablative BU/Flu  Ursodiol ppx    Mucositis  WHO Grade 2   Cont good oral care, normal saline rinses  PRN BMX, oxy    At risk for deconditioning   Encouraged physical activity, ambulation  Consult PT if needed    Psychosocial  Cont emotional support     Smoking Cessation  Offered nicotine replacement & [place] [facility]oking Cessation [facility]; patient declining at this time    Dispo planning  Anticipate discharge following engraftment and clinical stability  BMT consent signed  BMT attending [name]  Primary care physician [name]  RN coordinator Anne/Sharon  Lives in [place]    Patient seen and discussed with Dr. Raynaldo Opitz, NP    Lab Data      Lab results: 05/25/15  1610 05/24/15  9604 05/23/15  0139 05/22/15  5409  05/19/15  0225  05/17/15  0345 05/16/15  8119 05/15/15  0112 05/14/15  0106 05/13/15  0107 05/12/15  0527 05/11/15  1649   WBC 0.2* 0.6* 0.8* 1.2*  < > 2.7*  < > 3.3* 7.6 19.5* 35.4* 44.3* 56.1* 43.7*   Hemoglobin 6.9* 5.8* 6.5* 6.0*  < > 7.5*  < > 7.6* 8.5* 8.3* 7.9* 6.5* 7.2* 7.1*   Hematocrit 20* 17* 19* 17*  < > 22*  < > 23* 26* 26* 25* 21* 23* 22*   RBC 2.4* 2.0* 2.3* 2.1*  < > 2.6*  < > 2.6* 3.0* 2.9* 2.7* 2.2* 2.5* 2.4*   Platelets 15* 9* 7* 11*  < > 37*  < > 36* 39* 47* 47* 54* 62* 59*   Neut #  K/uL 0.1* 0.4* 0.7* 1.1*  < > 2.6  < > 3.2 7.0* 17.8* 29.0* 32.8* 38.7* 27.1*   Lymph # K/uL 0.1* 0.2* 0.1* 0.1*  < > 0.1*   < > 0.0* 0.2* 0.4* 1.1* 2.2 4.5* 2.6   Mono # K/uL 0.0* 0.0* 0.0* 0.0*  < > 0.0*  < > 0.0* 0.0* 0.0* 0.3 0.0* 1.4* 0.4   Eos # K/uL 0.0 0.0 0.0 0.0  < > 0.0  < > 0.0 0.0 0.0 0.0 0.4 0.0 0.0   Baso # K/uL 0.0 0.0 0.0 0.0  < > 0.0  < > 0.0 0.0 0.0 0.0 0.0 0.0 0.0   Seg Neut % 52.1 71.4 81.7 88.0  < > 94.7  < > 94.6 90.2 87.1 71.9 56.0 49.3 53.5   Lymphocyte % 47.9 25.4 18.3 11.1  < > 4.4  < > 0.9 1.8 1.7 2.6 5.3 7.6 6.1   Monocyte % 0.0 1.6 0.0 0.0  < > 0.0  < > 0.0 0.0 0.0 0.9 0.0 2.5 0.9   Eosinophil % 0.0 0.0 0.0 0.0  < > 0.0  < > 0.0 0.0 0.0 0.0 0.9 0.0 0.0   Basophil % 0.0 0.0 0.0 0.0  < > 0.0  < > 0.0 0.0 0.0 0.0 0.0 0.0 0.0   Bands %  --   --   --  1  --  1  --  1 2 4 10  18* 20* 8   Myelocyte %  --   --   --   --   --   --   --  2* 2* 2* 9* 4* 2* 12*   Metamyelocyte %  --   --   --   --   --   --   --  2* 4* 5* 4* 13* 17* 12*   Promyelocyte %  --   --   --   --   --   --   --   --   --   --  2* 1*  --  4*   Blasts %  --   --   --   --   --   --   --   --   --   --   --  2* 2* 3*   < > = values in this interval not displayed.            Lab results: 05/25/15  0337 05/24/15  0336 05/23/15  0139   Sodium 135 138 137   Potassium 4.9 5.3* 5.0   Chloride 99 101 100   CO2 23 23 23    UN 17 22* 29*   Creatinine 0.77 0.83 0.84   GFR,Caucasian 87 80 79   GFR,Black 101 92 91   Glucose 117* 129* 155*   Calcium 8.6 8.7 8.6           Lab results: 05/25/15  0337   Magnesium 1.4       Lab Results   Component Value Date    ALT 41 (H) 05/25/2015    AST 23 05/25/2015     Lab Results   Component Value Date    ALK 58 05/25/2015     Lab Results   Component Value Date    TB 1.2 05/25/2015     LD   Date Value Ref Range Status   05/25/2015 186 118 - 225 U/L Final     LD, PL   Date Value Ref Range  Status   05/09/2015 1146 (H) 118 - 225 U/L Final     LDL Calculated   Date Value Ref Range Status   08/27/2013 113 mg/dL Final     Comment:     REFERENCE RANGE:  < 100 Optimal                  100-129 Near or above optimal                   130-159 Borderline High                  160-189 High                    > 189 Very High         I saw and evaluated the patient. I agree with the NP's findings and plan of care as documented above. Carly Higgins is a 55 yo female with unclassifiable myeloproliferative neoplasm who is day +8 of MAC BUFLU and MUD allogeneic PBSCT. Her post-transplant course has been complicated by oral mucositis.     S: She complained for worsening mouth/throat pain with decreased oral intake. No diarrhea.     O:  Carly Higgins was laying down in her bed in distress secondary to pain. Oral mucosa with evidence of mucosal hypertrophy and erythema and ulceration involving the posterior hypopharynx. Clear lungs with no w/c, audible heart sounds with no murmur or gallops, soft and non-tender abdomen, and no leg edema.     Lab.: reviewed.     A/P:  Carly Higgins is a 55 yo female with unclassifiable myeloproliferative neoplasm who is day +8 of MAC BUFLU and MUD allogeneic PBSCT. Her post-transplant course has been complicated by oral and anal mucositis, grade III, and diarrhea. The plan is to start dilaudid PCA for pain control for mucositis. Otherwise, we will continue IVF hydration as needed. We continue daily labs to assess for electrolyte replacements, tac adjustments and blood transfusion needs. Continue prophylactic antibiotics. Continue tac and give methotrexate as planned on days 1,3, 6, and 11 post-transplant for GVHD prophylaxis. Encouraged sitting up in chair and keeping adequate PO intake.

## 2015-05-26 LAB — CBC AND DIFFERENTIAL
Hematocrit: 20 % — ABNORMAL LOW (ref 34–45)
Hemoglobin: 7.1 g/dL — ABNORMAL LOW (ref 11.2–15.7)
MCH: 30 pg/cell (ref 26–32)
MCHC: 36 g/dL (ref 32–36)
MCV: 82 fL (ref 79–95)
Platelets: 8 10*3/uL — CL (ref 160–370)
RBC: 2.4 MIL/uL — ABNORMAL LOW (ref 3.9–5.2)
RDW: 15.9 % — ABNORMAL HIGH (ref 11.7–14.4)
WBC: 0.1 10*3/uL — ABNORMAL LOW (ref 4.0–10.0)

## 2015-05-26 LAB — RED BLOOD CELLS
Coded Blood type: 6200
Component blood type: A POS
Dispense status: TRANSFUSED

## 2015-05-26 LAB — BASIC METABOLIC PANEL
Anion Gap: 13 (ref 7–16)
CO2: 22 mmol/L (ref 20–28)
Calcium: 8.4 mg/dL — ABNORMAL LOW (ref 8.6–10.2)
Chloride: 98 mmol/L (ref 96–108)
Creatinine: 0.76 mg/dL (ref 0.51–0.95)
GFR,Black: 102 *
GFR,Caucasian: 89 *
Glucose: 150 mg/dL — ABNORMAL HIGH (ref 60–99)
Lab: 17 mg/dL (ref 6–20)
Potassium: 5.1 mmol/L (ref 3.3–5.1)
Sodium: 133 mmol/L (ref 133–145)

## 2015-05-26 LAB — URINALYSIS WITH REFLEX TO MICROSCOPIC
Ketones, UA: NEGATIVE
Leuk Esterase,UA: NEGATIVE
Nitrite,UA: NEGATIVE
Protein,UA: NEGATIVE mg/dL
Specific Gravity,UA: 1.017 (ref 1.002–1.030)
pH,UA: 5 (ref 5.0–8.0)

## 2015-05-26 LAB — URINE MICROSCOPIC (IQ200)
RBC,UA: 1 /hpf (ref 0–2)
WBC,UA: 2 /hpf (ref 0–5)

## 2015-05-26 LAB — POCT GLUCOSE: Glucose POCT: 152 mg/dL — ABNORMAL HIGH (ref 60–99)

## 2015-05-26 LAB — PREALBUMIN: Prealbumin: 25 mg/dL (ref 20–40)

## 2015-05-26 LAB — TACROLIMUS LEVEL: Tacrolimus: 7.3 ng/mL

## 2015-05-26 LAB — MAGNESIUM: Magnesium: 1.2 mEq/L — ABNORMAL LOW (ref 1.3–2.1)

## 2015-05-26 LAB — TRIGLYCERIDES: Triglycerides: 156 mg/dL — AB

## 2015-05-26 MED ORDER — FLUCONAZOLE 200 MG IN 100 ML NACL IV SOLN *I*
200.0000 mg | INTRAVENOUS | Status: DC
Start: 2015-05-26 — End: 2015-05-27
  Administered 2015-05-26: 200 mg via INTRAVENOUS
  Filled 2015-05-26 (×2): qty 100

## 2015-05-26 MED ORDER — TACROLIMUS 5 MG/ML IV SOLN *I*
2.0000 mg/d | INTRAVENOUS | Status: DC
Start: 2015-05-26 — End: 2015-06-02
  Administered 2015-05-26 – 2015-06-01 (×10): 2 mg/d via INTRAVENOUS
  Filled 2015-05-26 (×11): qty 0.51

## 2015-05-26 MED ORDER — SODIUM CHLORIDE 0.9 % IV SOLN WRAPPED *I*
200.0000 mg | Freq: Two times a day (BID) | INTRAVENOUS | Status: DC
Start: 2015-05-26 — End: 2015-06-03
  Administered 2015-05-26 – 2015-06-03 (×17): 200 mg via INTRAVENOUS
  Filled 2015-05-26 (×19): qty 4

## 2015-05-26 MED ORDER — URSODIOL 50 MG/ML COMPOUNDED PO SUSP SUGAR-FREE *I*
300.0000 mg | Freq: Two times a day (BID) | ORAL | Status: DC
Start: 2015-05-26 — End: 2015-05-27
  Administered 2015-05-26 – 2015-05-27 (×3): 300 mg via ORAL
  Filled 2015-05-26 (×3): qty 6

## 2015-05-26 MED ORDER — PANTOPRAZOLE SODIUM 40 MG IV SOLR *I*
40.0000 mg | Freq: Two times a day (BID) | INTRAVENOUS | Status: DC
Start: 2015-05-26 — End: 2015-06-02
  Administered 2015-05-26 – 2015-06-02 (×15): 40 mg via INTRAVENOUS
  Filled 2015-05-26 (×17): qty 10

## 2015-05-26 NOTE — Plan of Care (Signed)
Comfort - BMT-related    • Diarrhea controlled or eliminated Maintaining    • Without infection Maintaining        Graft Versus Host Disease (GVHD) Bundle    • Without rash Maintaining    • Stool output within parameters (</= 500ml in 24 h) Maintaining    • Total bilirubin </= 2 Maintaining        Psychosocial    • Demonstrates ability to cope with illness Maintaining        Safety    • Patient will remain free of falls Maintaining          Comfort - BMT-related    • Nausea controlled or eliminated Progressing towards goal        Mobility    • Patient's functional status is maintained or improved Progressing towards goal        Nutrition    • Patient's nutritional status is maintained or improved Progressing towards goal        Pain/Comfort    • Patient's pain or discomfort is manageable Progressing towards goal

## 2015-05-26 NOTE — Interdisciplinary Rounds (Signed)
Interdisciplinary Rounds Note    Date: 05/26/2015   Time: 7:30 AM   Attendance:  Attending, PA, Charge RN    Admit Date/Time:  05/11/2015  2:02 PM    Principal Problem: <principal problem not specified>  Problem List:   Patient Active Problem List    Diagnosis Date Noted    Myeloproliferative disease 05/11/2015    MPN (myeloproliferative neoplasm) 01/19/2014    Leukocytosis 09/22/2013       The patient's problem list and interdisciplinary care plan was reviewed.    Discharge Planning                 *Does patient currently have home care services?: No     *Current External Services: None                      Plan    05/14/15 Day -3.  Will receive Flu/Bu tonight.  Pt tolerating chemo well.  Continue supportive care.   5/7: doing well, continue supportive care  5/8:  Day -1.  Mild nausea, fatigue.  Continue supportive care.  05/17/2015 Day 0, WBC 3.3, ANC 3.2, Hct 23, plts 36, will receive MUD PBSCs today, continue supportive care.  05/18/2015 Day +1 WBC 3.9, ANC 3.7, hct 21, plts 52K, received MUD PBSCs yesterday, tolerated well, MTX given per protocol, continue supportive care.  05/19/15:  Day +2.  Counts dropping with ANC 2600 today.  Glucose control with BG's and SS insulin coverage d/t steroids.  Continue supportive care.  05/20/15 D +3  Labs stable/falling. Multiple episodes of vomiting yesterday, abd discomfort.   Continue supportive care.  5/13: Day +4. BMX for mucositis. Dizzy- 500 ml bolus and add daily prn bolus. Helmet/assist out of bed.  05/22/15: Day +5 today, c/o rectal pain after having diarrhea, given topicals to alleviate burning discomfort, feeling better today. Continue supportive care.  05/23/15:  Day +6.  Mild complaints of nausea and diarrhea.  Check stool for C-Diff and if negative will start imodium.  Complaining of rectal pain - adjust medications to control discomfort.  Due fpr MTX today.  ANC 700,  Plt 7 K and hct 19 - transfused with both products today. Continue supportive care.  05/24/2015 Day  +7 WBC 0.6, ANC 0.4, hct 17, plts 9K, transfused with plts, will receive PRBCs, complaining of mucusitis pain, not eating, encouraged pt to take pain medication, also needs encouragement to get OOB, otherwise continue supportive care.  05/25/2015 Day +8 WBC 0.2, ANC 0.1, hct 20, plts 15K, complaining of increased pain for which the dilaudid helps but does not last per pt, starting PCA, continue other supportive measures.  05/26/15:  Day +9, nadired.  Receiving blood and plts today.  Will stop BG's and SS insulin coverage.  Encourage patient increase activity.  Continue supportive care.        Anticipated Discharge Date:  Discharge Disposition: Home, choiced to [facility]

## 2015-05-26 NOTE — Progress Notes (Addendum)
Blood & Marrow Transplant Program APP Progress Note    CC   Carly Higgins is a 55 y.o. 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 By [employer] testing, she has mutations of TET2, ASXL1, CCND2, GATA2, SRSF2.     Admitted for MUD allogeneic PBSC transplant.     Length of stay  LOS: 15 days     Interval History   Mucositis continues, but pain improved.  Not using bolus dose PCA frequently, consider decreasing tomorrow if improvement continues.  Single episode hematuria overnight with no recurrence, platelets 8 - follow up UA and culture.  BGs normalized, discontinue SSI and BG checks.      Review of Systems   A comprehensive review of 13 systems was performed; pertinent positives checked below:  _0  fevers  _1  fast pulse _2  fatigue   _3  chills _4  chest pain _5  dysuria    _6  dry eyes _7  nausea/emesis  _8  rash   _9  ear pain _10  abdominal cramping _11  skin changes   _12  sores in mouth _13  abdominal discomfort _14  pain   _15  sore throat _16  loose stool, non-formed _17  none of the above   _18  cough  _19  diarrhea, watery    _20  sputum production  _21  weight loss    _22  shortness of breath _23  insufficient oral intake      Vital Signs and I&O   Temp:  [36.2 C (97.2 F)-37.8 C (100 F)] 36.3 C (97.3 F)  Heart Rate:  [82-105] 95  Resp:  [16-20] 16  BP: (108-159)/(57-75) 135/68    Intake/Output Summary (Last 24 hours) at 05/26/15 1452  Last data filed at 05/26/15 1231   Gross per 24 hour   Intake          2400.28 ml   Output              800 ml   Net          1600.28 ml        Physical Exam     Head:  Normocephalic, atraumatic   Eyes:  Conjunctiva/corneas clear   Throat: Oral mucosa whitish, edematous, dry; erythematous lesions in posterior pharynx   Lungs:   CTA B/L, effort unlabored    Heart:  RRR, no murmur/rub/gallop   Abdomen:   Soft, nondistended, diffusely tender, BS present   Extremities: No edema   Pulses: 2+ and symmetric   Skin: No rashes or lesions    Neurologic: A&O X 3, CMS intact   Central venous access: IVAD - C/D/I      Assessment & Plan   Carly Higgins is a 55 yo female with unclassifiable myeloproliferative neoplasm.  By [employer] testing, she has mutations of TET2, ASXL1, CCND2, GATA2, SRSF2. Admitted for MUD allogeneic PBSCT.     Atypical MPN  Completed Decitabine 63m/m2 IV daily x 5 days - Cycle # 1 12/15/14  On intermittent/dose adjusted hydroxyurea at admission - discontinued 5/5 in setting of falling WBC     Day -5 thru -2 (5/4-5/7):  Flu 45mm2, Bu 13038m2 - Busulfan dose adjusted to 285m46mr last 2 doses (from 244mg78may -1 (5/8): Rest day  Day 0 (5/9): MUD (10/10 HLA, female) allo PBSCT, ABO: A+/A+, CMV: neg/neg     Today is BMT day +9    Pancytopenia  Transfuse 1 unit RBCs for hematocrit <21% and/or transfuse 1 unit platelets for platelet count <11,000 OR transfuse 1 unit platelets for platelet count <  20,000 if bleeding or fever.  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 with tacrolimus and methotrexate  Tacro by continuous IV infusion beginning on day-1 via PIV  Target tacrolimus level 8-12    Methotrexate will dose equivalent leucovorin will be adminstered as follows:   15 mg/m2 on day +1 (>24 hrs from stem cell infusion) (full dose given)  10 mg/m2 on days +3, +6, and +11     Renal  Creatinine at admission was 0.7, stable  Encouraged PO intake   Daily 1L bolus, hold for PO intake  >1500cc      Electrolyte imbalance  Replace electrolytes as to the following parameters  Hypokalemia, supplement when serum K <3.6  Hypomagnesemia, supplement when serum Mag <1.5 - supplemented today   Hypocalcemia, supplement when corrected calcium <8.9     At risk for malnutrition/hypoalbuminemia  Regular diet  Albumin level on admission 4.3  Nutrition following     GI  ATC Zofran   PRN Phenergan, Ativan, simethicone   Protonix BID for GERD, stress ulcer ppx  PRN Tums, carafate for GERD   PRN Tucks, Anusol for rectal/anal pain    VOD/SOS  Moderate risk with ablative BU/Flu  Ursodiol ppx  No signs/symptoms     Mucositis  WHO Grade 3  Dilaudid PCA - consider decreasing soon, symptoms improving and not using bolus dose regularly   Cont good oral care, normal saline rinses  PRN BMX    GU  Hematuria overnight, plts 8, no recurrence  Follow up UA, culture     At risk for deconditioning   Encouraged physical activity, ambulation  Consult PT if needed    Psychosocial  Cont emotional support     Smoking Cessation  Offered nicotine replacement & [place] [facility]oking Cessation [facility]; patient declining at this time    Dispo planning  Anticipate discharge following engraftment and clinical stability  BMT consent signed  BMT attending [name]  Primary care physician [name]  RN coordinator Anne/Sharon  Lives in [place]    Patient seen and discussed with Dr. Florentina Jenny, PA    Lab Data      Lab results: 05/26/15  3790 05/25/15  2409 05/24/15  7353 05/23/15  0139 05/22/15  2992  05/19/15  0225  05/17/15  0345 05/16/15  4268 05/15/15  0112 05/14/15  0106 05/13/15  0107 05/12/15  0527 05/11/15  1649   WBC 0.1* 0.2* 0.6* 0.8* 1.2*  < > 2.7*  < > 3.3* 7.6 19.5* 35.4* 44.3* 56.1* 43.7*   Hemoglobin 7.1* 6.9* 5.8* 6.5* 6.0*  < > 7.5*  < > 7.6* 8.5* 8.3* 7.9* 6.5* 7.2* 7.1*   Hematocrit 20* 20* 17* 19* 17*  < > 22*  < > 23* 26* 26* 25* 21* 23* 22*   RBC 2.4* 2.4* 2.0* 2.3* 2.1*  < >  2.6*  < >  2.6* 3.0* 2.9* 2.7* 2.2* 2.5* 2.4*   Platelets 8* 15* 9* 7* 11*  < > 37*  < > 36* 39* 47* 47* 54* 62* 59*   Neut # K/uL  --  0.1* 0.4* 0.7* 1.1*  < > 2.6  < > 3.2 7.0* 17.8* 29.0* 32.8* 38.7* 27.1*   Lymph # K/uL  --  0.1* 0.2* 0.1* 0.1*  < > 0.1*  < > 0.0* 0.2* 0.4* 1.1* 2.2 4.5* 2.6   Mono # K/uL  --  0.0* 0.0* 0.0* 0.0*  < > 0.0*  < > 0.0* 0.0* 0.0* 0.3 0.0* 1.4* 0.4   Eos # K/uL  --  0.0 0.0 0.0 0.0  < > 0.0  < > 0.0 0.0 0.0 0.0 0.4 0.0 0.0   Baso # K/uL  --  0.0 0.0 0.0 0.0  < > 0.0  < > 0.0 0.0 0.0 0.0 0.0 0.0 0.0   Seg Neut %  --  52.1 71.4 81.7 88.0  < > 94.7  < > 94.6 90.2 87.1 71.9 56.0 49.3 53.5   Lymphocyte %  --  47.9 25.4 18.3 11.1  < > 4.4  < > 0.9 1.8 1.7 2.6 5.3 7.6 6.1   Monocyte %  --  0.0 1.6 0.0 0.0  < > 0.0  < > 0.0 0.0 0.0 0.9 0.0 2.5 0.9   Eosinophil %  --  0.0 0.0 0.0 0.0  < > 0.0  < > 0.0 0.0 0.0 0.0 0.9 0.0 0.0   Basophil %  --  0.0 0.0 0.0 0.0  < > 0.0  < > 0.0 0.0 0.0 0.0 0.0 0.0 0.0   Bands %  --   --   --   --  1  --  1  --  _0 18* 20* 8   Myelocyte %  --   --   --   --   --   --   --   --  2* 2* 2* 9* 4* 2* 12*   Metamyelocyte %  --   --   --   --   --   --   --   --  2* 4* 5* 4* 13* 17* 12*   Promyelocyte %  --   --   --   --   --   --   --   --   --   --   --  2* 1*  --  4*   Blasts %  --   --   --   --   --   --   --   --   --   --   --   --  2* 2* 3*   < > = values in this interval not displayed.            Lab results: 05/26/15  0315 05/25/15  0337 05/24/15  0336   Sodium 133 135 138   Potassium 5.1 4.9 5.3*   Chloride 98 99 101   CO2 _1 UN 17 17 22*   Creatinine 0.76 0.77 0.83   GFR,Caucasian 89 87 80   GFR,Black 102 101 92   Glucose 150* 117* 129*   Calcium 8.4* 8.6 8.7           Lab results: 05/26/15  0315   Magnesium 1.2*       Lab Results   Component  Value Date    ALT 41 (H) 05/25/2015    AST 23 05/25/2015     Lab Results   Component Value Date    ALK 58 05/25/2015     Lab Results   Component Value Date    TB 1.2 05/25/2015     LD   Date Value Ref Range Status    05/25/2015 186 118 - 225 U/L Final     LD, PL   Date Value Ref Range Status   05/09/2015 1146 (H) 118 - 225 U/L Final     LDL Calculated   Date Value Ref Range Status   08/27/2013 113 mg/dL Final     Comment:     REFERENCE RANGE:  < 100 Optimal                  100-129 Near or above optimal                  130-159 Borderline High                  160-189 High                    > 189 Very High       I saw and evaluated the patient. I agree with the PA's findings and plan of care as documented above. Carly Higgins is a 55 yo female with unclassifiable myeloproliferative neoplasm who is day +9 of MAC BUFLU and MUD allogeneic PBSCT. Her post-transplant course has been complicated by oral mucositis.     S: She thinks that her mouth/throat pain have improved since starting PCA. She continues to have decreased oral intake. No diarrhea.     O:  Ms. Giebler was laying down in her bed in no distress. Oral mucosa with evidence of mucosal hypertrophy, swollen tongue, and erythema and ulceration involving the posterior hypopharynx. Clear lungs with no w/c, audible heart sounds with no murmur or gallops, soft and non-tender abdomen, and no leg edema.     Lab.: reviewed.     A/P:  Carly Higgins is a 54 yo female with unclassifiable myeloproliferative neoplasm who is day +9 of MAC BUFLU and MUD allogeneic PBSCT. Her post-transplant course has been complicated by oral and anal mucositis, grade III, and diarrhea. The plan is to continue dilaudid PCA for pain control for mucositis. Otherwise, we will continue IVF hydration as needed. We continue daily labs to assess for electrolyte replacements, tac adjustments and blood transfusion needs. Continue prophylactic antibiotics. Continue tac and give methotrexate as planned on days 1,3, 6, and 11 post-transplant for GVHD prophylaxis. Encouraged sitting up in chair and trying PO intake.

## 2015-05-26 NOTE — Progress Notes (Signed)
Utilization Management    Level of care inpatient as of the date 05/11/2015    Sally-Anne Wamble, RN    Pager: [phone]

## 2015-05-26 NOTE — Plan of Care (Signed)
(  1900-0700) Avss on RA and alert and oriented x3. Pt given PRN phenergan and ativan at different times for emesis during the night with positive effect. Received one unit PRBCs. Will continue to monitor and provide handoff to oncoming nurse for continuity of care.       Comfort - BMT-related     Nausea controlled or eliminated Maintaining     Diarrhea controlled or eliminated Maintaining     Without infection Maintaining        Graft Versus Host Disease (GVHD) Bundle     Without rash Maintaining     Stool output within parameters (</= 559ml in 24 h) Maintaining     Total bilirubin </= 2 Maintaining        Mobility     Patient's functional status is maintained or improved Maintaining        Nutrition     Patient's nutritional status is maintained or improved Maintaining        Psychosocial     Demonstrates ability to cope with illness Maintaining        Safety     Patient will remain free of falls Maintaining

## 2015-05-27 ENCOUNTER — Other Ambulatory Visit: Payer: Self-pay | Admitting: Oncology

## 2015-05-27 LAB — CBC AND DIFFERENTIAL
Hematocrit: 19 % — CL (ref 34–45)
Hemoglobin: 6.9 g/dL — ABNORMAL LOW (ref 11.2–15.7)
MCH: 29 pg/cell (ref 26–32)
MCHC: 37 g/dL — ABNORMAL HIGH (ref 32–36)
MCV: 80 fL (ref 79–95)
Platelets: 10 10*3/uL — CL (ref 160–370)
RBC: 2.4 MIL/uL — ABNORMAL LOW (ref 3.9–5.2)
RDW: 15.9 % — ABNORMAL HIGH (ref 11.7–14.4)
WBC: 0.1 10*3/uL — ABNORMAL LOW (ref 4.0–10.0)

## 2015-05-27 LAB — RED BLOOD CELLS
Coded Blood type: 6200
Component blood type: A POS
Dispense status: TRANSFUSED

## 2015-05-27 LAB — COMPREHENSIVE METABOLIC PANEL
ALT: 37 U/L — ABNORMAL HIGH (ref 0–35)
AST: 21 U/L (ref 0–35)
Albumin: 3.4 g/dL — ABNORMAL LOW (ref 3.5–5.2)
Alk Phos: 60 U/L (ref 35–105)
Anion Gap: 14 (ref 7–16)
Bilirubin,Total: 6.1 mg/dL — ABNORMAL HIGH (ref 0.0–1.2)
CO2: 22 mmol/L (ref 20–28)
Calcium: 8.4 mg/dL — ABNORMAL LOW (ref 8.6–10.2)
Chloride: 97 mmol/L (ref 96–108)
Creatinine: 0.64 mg/dL (ref 0.51–0.95)
GFR,Black: 116 *
GFR,Caucasian: 101 *
Glucose: 152 mg/dL — ABNORMAL HIGH (ref 60–99)
Lab: 13 mg/dL (ref 6–20)
Potassium: 4.8 mmol/L (ref 3.3–5.1)
Sodium: 133 mmol/L (ref 133–145)
Total Protein: 5.4 g/dL — ABNORMAL LOW (ref 6.3–7.7)

## 2015-05-27 LAB — PLATELETS,PHERESIS
Coded Blood type: 6200
Component blood type: A POS
Dispense status: TRANSFUSED

## 2015-05-27 LAB — TYPE AND SCREEN
ABO RH Blood Type: A POS
Antibody Screen: NEGATIVE

## 2015-05-27 LAB — AEROBIC CULTURE: Aerobic Culture: 0

## 2015-05-27 LAB — LACTATE DEHYDROGENASE: LD: 186 U/L (ref 118–225)

## 2015-05-27 LAB — PROTIME-INR
INR: 1.3 — ABNORMAL HIGH (ref 0.9–1.1)
Protime: 15 s — ABNORMAL HIGH (ref 10.0–12.9)

## 2015-05-27 LAB — MAGNESIUM: Magnesium: 1.2 mEq/L — ABNORMAL LOW (ref 1.3–2.1)

## 2015-05-27 LAB — PHOSPHORUS: Phosphorus: 3 mg/dL (ref 2.7–4.5)

## 2015-05-27 LAB — TACROLIMUS LEVEL: Tacrolimus: 8.3 ng/mL

## 2015-05-27 LAB — BILIRUBIN, DIRECT: Bilirubin,Direct: 4.7 mg/dL — ABNORMAL HIGH (ref 0.0–0.3)

## 2015-05-27 MED ORDER — LORAZEPAM 1 MG PO TABS *I*
1.0000 mg | ORAL_TABLET | Freq: Three times a day (TID) | ORAL | Status: DC
Start: 2015-05-28 — End: 2015-05-28
  Administered 2015-05-27: 1 mg via ORAL
  Filled 2015-05-27 (×2): qty 1

## 2015-05-27 MED ORDER — URSODIOL 50 MG/ML COMPOUNDED PO SUSP SUGAR-FREE *I*
300.0000 mg | Freq: Three times a day (TID) | ORAL | Status: DC
Start: 2015-05-27 — End: 2015-06-02
  Administered 2015-05-27 – 2015-06-02 (×17): 300 mg via ORAL
  Filled 2015-05-27 (×21): qty 6

## 2015-05-27 MED ORDER — LORAZEPAM 2 MG/ML IJ SOLN *I*
0.5000 mg | Freq: Four times a day (QID) | INTRAMUSCULAR | Status: DC
Start: 2015-05-27 — End: 2015-05-27
  Administered 2015-05-27: 0.5 mg via INTRAVENOUS
  Filled 2015-05-27: qty 1

## 2015-05-27 MED ORDER — CIPROFLOXACIN 500 MG/5ML (10%) PO SUSR *I*
500.0000 mg | Freq: Two times a day (BID) | ORAL | Status: DC
Start: 2015-05-27 — End: 2015-05-27

## 2015-05-27 MED ORDER — MORPHINE SULFATE 5 MG/ML PCA *I*
INTRAMUSCULAR | Status: DC
Start: 2015-05-27 — End: 2015-05-30
  Filled 2015-05-27 (×3): qty 25

## 2015-05-27 MED ORDER — CIPROFLOXACIN IN D5W 400 MG/200ML IV SOLN *I*
400.0000 mg | Freq: Two times a day (BID) | INTRAVENOUS | Status: DC
Start: 2015-05-27 — End: 2015-05-28
  Administered 2015-05-27 – 2015-05-28 (×2): 400 mg via INTRAVENOUS
  Filled 2015-05-27 (×4): qty 200

## 2015-05-27 MED ORDER — ACETAMINOPHEN 160 MG/5 ML PO ORDERABLE *I*
650.0000 mg | Freq: Four times a day (QID) | Status: DC | PRN
Start: 2015-05-27 — End: 2015-06-07
  Administered 2015-05-27 – 2015-06-06 (×5): 650 mg via ORAL
  Filled 2015-05-27 (×5): qty 20.31

## 2015-05-27 MED ORDER — SCOPOLAMINE BASE 1.5 MG TD PT72 *I*
1.0000 | MEDICATED_PATCH | TRANSDERMAL | Status: DC
Start: 2015-05-27 — End: 2015-05-29
  Administered 2015-05-27: 1.5 mg via TRANSDERMAL
  Filled 2015-05-27: qty 1

## 2015-05-27 MED ORDER — METRONIDAZOLE IN NACL 5 MG/ML IV SOLUTION *WRAPPED*
500.0000 mg | Freq: Three times a day (TID) | INTRAVENOUS | Status: DC
Start: 2015-05-27 — End: 2015-06-06
  Administered 2015-05-27 – 2015-06-06 (×30): 500 mg via INTRAVENOUS
  Filled 2015-05-27 (×30): qty 100

## 2015-05-27 NOTE — Plan of Care (Signed)
Comfort - BMT-related    • Nausea controlled or eliminated Maintaining    • Diarrhea controlled or eliminated Maintaining    • Without infection Maintaining        Graft Versus Host Disease (GVHD) Bundle    • Without rash Maintaining    • Stool output within parameters (</= 500ml in 24 h) Maintaining    • Total bilirubin </= 2 Maintaining        Mobility    • Patient's functional status is maintained or improved Maintaining        Nutrition    • Patient's nutritional status is maintained or improved Maintaining        Psychosocial    • Demonstrates ability to cope with illness Maintaining        Safety    • Patient will remain free of falls Maintaining          Pain/Comfort    • Patient's pain or discomfort is manageable Progressing towards goal

## 2015-05-27 NOTE — Plan of Care (Signed)
(  1900-0700) One elevated HR of 155 this AM, provider notified, no treatment. Aovss on RA and alert and oriented x3. Pt continues on morphine PCA pump with moderate effect. Multiple bouts of emesis overnight esp. after taking liquid PO meds. Given PRN phenergan & one time dose ativan with moderate effect. Pt received one unit PRBCs for hct of 19. Will continue to monitor and provide handoff to oncoming nurse for continuity of care.       Comfort - BMT-related     Diarrhea controlled or eliminated Maintaining     Without infection Maintaining        Graft Versus Host Disease (GVHD) Bundle     Without rash Maintaining     Stool output within parameters (</= 511ml in 24 h) Maintaining     Total bilirubin </= 2 Maintaining        Mobility     Patient's functional status is maintained or improved Maintaining        Nutrition     Patient's nutritional status is maintained or improved Maintaining        Psychosocial     Demonstrates ability to cope with illness Maintaining        Safety     Patient will remain free of falls Maintaining          Comfort - BMT-related     Nausea controlled or eliminated Progressing towards goal        Pain/Comfort     Patient's pain or discomfort is manageable Progressing towards goal

## 2015-05-27 NOTE — Progress Notes (Addendum)
[facility] Program APP Progress Note    CC   Carly Higgins is a 55 y.o. 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 By [employer] testing, she has mutations of TET2, ASXL1, CCND2, GATA2, SRSF2.     Admitted for MUD allogeneic PBSC transplant.     Length of stay  LOS: 16 days     Interval History   Day +10  C/o nausea; using ATC zofran and prn phenergan with some relief  No appetite d/t nausea and mucositis.  Pain is controlled on PCA, however does not use bolus doses as this makes her nauseous  T. Bili today is 6.1 and appears jaundice.  Mild tenderness right upper and lower quadrant  Poor po intake d/t nausea and mucositis.       Review of Systems   A comprehensive review of 13 systems was performed; pertinent positives checked below:  []  fevers  []  fast pulse [x]  fatigue   []  chills []  chest pain []  dysuria    []  dry eyes [x]  nausea/emesis  []  rash   []  ear pain []  abdominal cramping []  skin changes   []  sores in mouth [x]  abdominal discomfort [x]  pain   [x]  sore throat []  loose stool, non-formed []  none of the above   []  cough  []  diarrhea, watery    []  sputum production  []  weight loss    []  shortness of breath [x]  insufficient oral intake      Vital Signs and I&O   Temp:  [36 C (96.8 F)-37.3 C (99.1 F)] 36.8 C (98.2 F)  Heart Rate:  [93-112] 106  Resp:  [16-20] 20  BP: (109-139)/(57-74) 138/70    Intake/Output Summary (Last 24 hours) at 05/27/15 1615  Last data filed at 05/27/15 1335   Gross per 24 hour   Intake          3978.46 ml   Output             1850 ml   Net          2128.46 ml        Physical Exam     Head:  Normocephalic, atraumatic   Eyes:  Conjunctiva/corneas clear   Throat: Oral mucosa whitish, edematous, dry; erythematous lesions in posterior pharynx   Lungs:   CTA B/L, effort unlabored    Heart:  RRR, no murmur/rub/gallop   Abdomen:   Soft, nondistended, diffusely tender, BS present   Extremities: No edema    Pulses: 2+ and symmetric   Skin: No rashes or lesions, jaundice   Neurologic: A&O X 3, CMS intact   Central venous access: IVAD - C/D/I      Assessment & Plan   Carly Higgins is a 55 yo female with unclassifiable myeloproliferative neoplasm.  By [employer] testing, she has mutations of TET2, ASXL1, CCND2, GATA2, SRSF2. Admitted for MUD allogeneic PBSCT.     Atypical MPN  Completed Decitabine 68m/m2 IV daily x 5 days - Cycle # 1 12/15/14  On intermittent/dose adjusted hydroxyurea at admission - discontinued 5/5 in setting of falling WBC     Day -5 thru -2 (5/4-5/7):  Flu 473mm2, Bu 13079m2 - Busulfan dose adjusted to 285m15mr last 2 doses (from 244mg100may -1 (5/8): Rest day  Day 0 (5/9): MUD (10/10 HLA, female) allo PBSCT, ABO: A+/A+, CMV: neg/neg     Today is BMT day +10    Pancytopenia  Transfuse 1  unit RBCs for hematocrit <21% and/or transfuse 1 unit platelets for platelet count <11,000 OR transfuse 1 unit platelets for platelet count <20,000 if bleeding or fever.  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 with tacrolimus and methotrexate  Tacro by continuous IV infusion beginning on day-1 via PIV  Target tacrolimus level 8-12    Methotrexate will dose equivalent leucovorin will be adminstered as follows:   15 mg/m2 on day +1 (>24 hrs from stem cell infusion) (full dose given)  10 mg/m2 on days +3, +6, and +11     May need to hold day 11 MTX d/t mucositis  and t.bili    Renal  Creatinine at admission was 0.7, stable  Encouraged PO intake   Daily 1L bolus, hold for PO intake >1500cc      Electrolyte imbalance  Replace electrolytes as to the following parameters  Hypokalemia, supplement when serum K <3.6  Hypomagnesemia, supplement when serum Mag <1.5 - supplemented today   Hypocalcemia, supplement when corrected calcium <8.9     At risk for malnutrition/hypoalbuminemia  Regular diet  Albumin level on admission 4.3  Nutrition following     GI  ATC Zofran    PRN Phenergan, simethicone   Protonix BID for GERD, stress ulcer ppx  PRN Tums, carafate for GERD   PRN Tucks, Anusol for rectal/anal pain    VOD/SOS  Moderate risk with ablative BU/Flu  Ursodiol ppx  T.Bili 6.1, jaundice, nausea  Abdominal doppler US done- cw cholecystitis. Start cipro/flagyl    Mucositis  WHO Grade 3  Dilaudid PCA- making very nauseous  5/19 Change to morphine pca; decreased d/t somnolence   Cont good oral care, normal saline rinses  PRN BMX    GU  Hematuria x1;  no recurrence    At risk for deconditioning   Encouraged physical activity, ambulation  Consult PT if needed    Psychosocial  Cont emotional support     Smoking Cessation  Offered nicotine replacement & [place] [facility]; patient declining at this time    Dispo planning  Anticipate discharge following engraftment and clinical stability  BMT consent signed  BMT attending [name]  Primary care physician [name]  RN coordinator Anne/Sharon  Lives in [place]    Patient seen and discussed with Dr. Soledad Gerlach, NP    Lab Data      Lab results: 05/27/15  0020 05/26/15  0315 05/25/15  4259 05/24/15  5638 05/23/15  0139 05/22/15  7564  05/19/15  0225  05/17/15  0345 05/16/15  3329 05/15/15  0112 05/14/15  0106 05/13/15  0107 05/12/15  0527 05/11/15  1649   WBC 0.1* 0.1* 0.2* 0.6* 0.8* 1.2*  < > 2.7*  < > 3.3* 7.6 19.5* 35.4* 44.3* 56.1* 43.7*   Hemoglobin 6.9* 7.1* 6.9* 5.8* 6.5* 6.0*  < >  7.5*  < > 7.6* 8.5* 8.3* 7.9* 6.5* 7.2* 7.1*   Hematocrit 19* 20* 20* 17* 19* 17*  < > 22*  < > 23* 26* 26* 25* 21* 23* 22*   RBC 2.4* 2.4* 2.4* 2.0* 2.3* 2.1*  < > 2.6*  < > 2.6* 3.0* 2.9* 2.7* 2.2* 2.5* 2.4*   Platelets 10* 8* 15* 9* 7* 11*  < > 37*  < > 36* 39* 47* 47* 54* 62* 59*   Neut # K/uL  --   --  0.1* 0.4* 0.7* 1.1*  < > 2.6  < > 3.2 7.0* 17.8* 29.0* 32.8* 38.7* 27.1*   Lymph # K/uL  --   --  0.1* 0.2* 0.1* 0.1*  < > 0.1*  < > 0.0* 0.2* 0.4* 1.1* 2.2 4.5* 2.6   Mono # K/uL  --   --  0.0* 0.0* 0.0* 0.0*  < > 0.0*  < > 0.0* 0.0* 0.0* 0.3 0.0* 1.4* 0.4   Eos # K/uL  --   --  0.0 0.0 0.0 0.0  < > 0.0  < > 0.0 0.0 0.0 0.0 0.4 0.0 0.0   Baso # K/uL  --   --  0.0 0.0 0.0 0.0  < > 0.0  < > 0.0 0.0 0.0 0.0 0.0 0.0 0.0   Seg Neut %  --   --  52.1 71.4 81.7 88.0  < > 94.7  < > 94.6 90.2 87.1 71.9 56.0 49.3 53.5   Lymphocyte %  --   --  47.9 25.4 18.3 11.1  < > 4.4  < > 0.9 1.8 1.7 2.6 5.3 7.6 6.1   Monocyte %  --   --  0.0 1.6 0.0 0.0  < > 0.0  < > 0.0 0.0 0.0 0.9 0.0 2.5 0.9   Eosinophil %  --   --  0.0 0.0 0.0 0.0  < > 0.0  < > 0.0 0.0 0.0 0.0 0.9 0.0 0.0   Basophil %  --   --  0.0 0.0 0.0 0.0  < > 0.0  < > 0.0 0.0 0.0 0.0 0.0 0.0 0.0   Bands %  --   --   --   --   --  1  --  1  --  1 2 4 10  18* 20* 8   Myelocyte %  --   --   --   --   --   --   --   --   --  2* 2* 2* 9* 4* 2* 12*   Metamyelocyte %  --   --   --   --   --   --   --   --   --  2* 4* 5* 4* 13* 17* 12*   Promyelocyte %  --   --   --   --   --   --   --   --   --   --   --   --  2* 1*  --  4*   Blasts %  --   --   --   --   --   --   --   --   --   --   --   --   --  2* 2* 3*   < > = values in this interval not displayed.            Lab results: 05/27/15  0020 05/26/15  0315 05/25/15  9604   Sodium 133 133 135   Potassium 4.8 5.1 4.9   Chloride 97 98 99   CO2 22 22 23    UN 13 17 17    Creatinine 0.64 0.76 0.77   GFR,Caucasian 101 89 87   GFR,Black 116 102 101   Glucose 152* 150* 117*   Calcium 8.4* 8.4* 8.6           Lab results: 05/27/15  0020    Magnesium 1.2*       Lab Results   Component Value Date    ALT 37 (H) 05/27/2015    AST 21 05/27/2015     Lab Results   Component Value Date    ALK 60 05/27/2015     Lab Results   Component Value Date    TB 6.1 (H) 05/27/2015     LD   Date Value Ref Range Status   05/27/2015 186 118 - 225 U/L Final     LD, PL   Date Value Ref Range Status   05/09/2015 1146 (H) 118 - 225 U/L Final     LDL Calculated   Date Value Ref Range Status   08/27/2013 113 mg/dL Final     Comment:     REFERENCE RANGE:  < 100 Optimal                  100-129 Near or above optimal                  130-159 Borderline High                  160-189 High                    > 189 Very High       I saw and evaluated the patient. I agree with the NP's findings and plan of care as documented above. DELPHA PERKO is a 55 yo female with unclassifiable myeloproliferative neoplasm who is day +10 of MAC BUFLU and MUD allogeneic PBSCT. Her post-transplant course has been complicated by oral/anal mucositis and diarrhea.     S: She is not feeling well today, mainly with increased nausea, and abdominal discomfort. Unable to have any po intake because of mucositis and nausea/vomiting. She thinks the PCA is causing nausea.      O:  Ms. Liming was laying down in her bed in distress. Oral mucosa with evidence of mucosal hypertrophy, swollen tongue, and erythema and ulceration involving the posterior hypopharynx. Clear lungs with no w/c, audible heart sounds with no murmur or gallops, soft with tender right upper quadrant, and no leg edema.     Lab.: reviewed. T Bili up to 6.1, mostly direct.    A/P:  ORIEL OJO is a 55 yo female with unclassifiable myeloproliferative neoplasm who is day +10 of MAC BUFLU and MUD allogeneic PBSCT. Her post-transplant course has been complicated by oral and anal mucositis, grade III, and diarrhea and now with direct hyperbilirubinemia with no overt signs of VOD. The plan is to obtain an US of the RUQ with dopplers to r/o  biliary etiology and to assess hepatic blood flow. We increased Actigall dose. We will test PT/INR. We will discontinue dilaudid PCA and switch to morphine PCA for pain control for mucositis. Otherwise, we will continue IVF hydration. We continue daily labs to assess for electrolyte replacements, tac adjustments and blood transfusion needs. Continue prophylactic antibiotics, but hold fluconazole for now. Continue tac  and give methotrexate as planned on days 1,3, 6, and 11 post-transplant for GVHD prophylaxis. Encouraged sitting up in chair and trying PO intake as possible.

## 2015-05-27 NOTE — Interdisciplinary Rounds (Addendum)
Interdisciplinary Rounds Note    Date: 05/27/2015   Time: 7:25 AM   Attendance:  Attending, PA, Charge RN    Admit Date/Time:  05/11/2015  2:02 PM    Principal Problem: <principal problem not specified>  Problem List:   Patient Active Problem List    Diagnosis Date Noted    Myeloproliferative disease 05/11/2015    MPN (myeloproliferative neoplasm) 01/19/2014    Leukocytosis 09/22/2013       The patient's problem list and interdisciplinary care plan was reviewed.    Discharge Planning                 *Does patient currently have home care services?: No     *Current External Services: None                      Plan    05/14/15 Day -3.  Will receive Flu/Bu tonight.  Pt tolerating chemo well.  Continue supportive care.   5/7: doing well, continue supportive care  5/8:  Day -1.  Mild nausea, fatigue.  Continue supportive care.  05/17/2015 Day 0, WBC 3.3, ANC 3.2, Hct 23, plts 36, will receive MUD PBSCs today, continue supportive care.  05/18/2015 Day +1 WBC 3.9, ANC 3.7, hct 21, plts 52K, received MUD PBSCs yesterday, tolerated well, MTX given per protocol, continue supportive care.  05/19/15:  Day +2.  Counts dropping with ANC 2600 today.  Glucose control with BG's and SS insulin coverage d/t steroids.  Continue supportive care.  05/20/15 D +3  Labs stable/falling. Multiple episodes of vomiting yesterday, abd discomfort.   Continue supportive care.  5/13: Day +4. BMX for mucositis. Dizzy- 500 ml bolus and add daily prn bolus. Helmet/assist out of bed.  05/22/15: Day +5 today, c/o rectal pain after having diarrhea, given topicals to alleviate burning discomfort, feeling better today. Continue supportive care.  05/23/15:  Day +6.  Mild complaints of nausea and diarrhea.  Check stool for C-Diff and if negative will start imodium.  Complaining of rectal pain - adjust medications to control discomfort.  Due fpr MTX today.  ANC 700,  Plt 7 K and hct 19 - transfused with both products today. Continue supportive care.  05/24/2015 Day  +7 WBC 0.6, ANC 0.4, hct 17, plts 9K, transfused with plts, will receive PRBCs, complaining of mucusitis pain, not eating, encouraged pt to take pain medication, also needs encouragement to get OOB, otherwise continue supportive care.  05/25/2015 Day +8 WBC 0.2, ANC 0.1, hct 20, plts 15K, complaining of increased pain for which the dilaudid helps but does not last per pt, starting PCA, continue other supportive measures.  05/26/15:  Day +9, nadired.  Receiving blood and plts today.  Will stop BG's and SS insulin coverage.  Encourage patient increase activity.  Continue supportive care.  05/27/15: Day +10.  Nadired, plts 10K and hct 19.  T.bili today 6.1.  Ursodiol increased to tid.  U/S of liver today.  Check PT/PTT.  Mucositis worse, tongue very swollen.  Continue IVF to hydrate.  Change diflucan to caspofungin as may contribute to increasing T.bili further.  Change dilaudid PCA to Morphine d/t persistent nausea. Continue supportive care.      Anticipated Discharge Date:  Discharge Disposition: Home, choiced to [facility]

## 2015-05-28 ENCOUNTER — Other Ambulatory Visit: Payer: Self-pay | Admitting: Gastroenterology

## 2015-05-28 LAB — HEPATIC FUNCTION PANEL
ALT: 32 U/L (ref 0–35)
AST: 19 U/L (ref 0–35)
Albumin: 3.3 g/dL — ABNORMAL LOW (ref 3.5–5.2)
Alk Phos: 65 U/L (ref 35–105)
Bili,Indirect: 1 mg/dL (ref 0.1–1.0)
Bilirubin,Direct: 4.4 mg/dL — ABNORMAL HIGH (ref 0.0–0.3)
Bilirubin,Total: 5.4 mg/dL — ABNORMAL HIGH (ref 0.0–1.2)
Total Protein: 5.2 g/dL — ABNORMAL LOW (ref 6.3–7.7)

## 2015-05-28 LAB — URINALYSIS WITH MICROSCOPIC
Glucose,UA: 50 mg/dL — AB
Ketones, UA: NEGATIVE
Leuk Esterase,UA: NEGATIVE
Nitrite,UA: NEGATIVE
Protein,UA: >=500 mg/dL — AB
RBC,UA: 4 /hpf — AB (ref 0–2)
Specific Gravity,UA: 1.015 (ref 1.002–1.030)
WBC,UA: 2 /hpf (ref 0–5)
pH,UA: 5 (ref 5.0–8.0)

## 2015-05-28 LAB — BASIC METABOLIC PANEL
Anion Gap: 16 (ref 7–16)
CO2: 22 mmol/L (ref 20–28)
Calcium: 8.3 mg/dL — ABNORMAL LOW (ref 8.6–10.2)
Chloride: 97 mmol/L (ref 96–108)
Creatinine: 0.68 mg/dL (ref 0.51–0.95)
GFR,Black: 114 *
GFR,Caucasian: 99 *
Glucose: 162 mg/dL — ABNORMAL HIGH (ref 60–99)
Lab: 15 mg/dL (ref 6–20)
Potassium: 4.3 mmol/L (ref 3.3–5.1)
Sodium: 135 mmol/L (ref 133–145)

## 2015-05-28 LAB — CBC AND DIFFERENTIAL
Hematocrit: 19 % — CL (ref 34–45)
Hemoglobin: 6.9 g/dL — ABNORMAL LOW (ref 11.2–15.7)
MCH: 29 pg/cell (ref 26–32)
MCHC: 36 g/dL (ref 32–36)
MCV: 79 fL (ref 79–95)
Platelets: 18 10*3/uL — CL (ref 160–370)
RBC: 2.4 MIL/uL — ABNORMAL LOW (ref 3.9–5.2)
RDW: 15.7 % — ABNORMAL HIGH (ref 11.7–14.4)
WBC: 0.1 10*3/uL — ABNORMAL LOW (ref 4.0–10.0)

## 2015-05-28 LAB — PLATELETS,PHERESIS
Coded Blood type: 6200
Component blood type: A POS
Dispense status: TRANSFUSED

## 2015-05-28 LAB — RED BLOOD CELLS
Coded Blood type: 6200
Component blood type: A POS
Dispense status: TRANSFUSED

## 2015-05-28 LAB — POCT GLUCOSE: Glucose POCT: 172 mg/dL — ABNORMAL HIGH (ref 60–99)

## 2015-05-28 LAB — MAGNESIUM: Magnesium: 1.1 mEq/L — ABNORMAL LOW (ref 1.3–2.1)

## 2015-05-28 LAB — TACROLIMUS LEVEL: Tacrolimus: 8.3 ng/mL

## 2015-05-28 LAB — LACTATE, VENOUS, WHOLE BLOOD: Lactate VEN,WB: 1.1 mmol/L (ref 0.5–2.2)

## 2015-05-28 MED ORDER — SENNOSIDES 8.6 MG PO TABS *I*
1.0000 | ORAL_TABLET | Freq: Two times a day (BID) | ORAL | Status: DC
Start: 2015-05-28 — End: 2015-06-01
  Administered 2015-05-28 – 2015-05-29 (×3): 1 via ORAL
  Filled 2015-05-28 (×3): qty 1

## 2015-05-28 MED ORDER — CEFEPIME HCL 2 GM/100ML IV SOLN *I*
2.0000 g | Freq: Three times a day (TID) | INTRAVENOUS | Status: DC
Start: 2015-05-28 — End: 2015-06-06
  Administered 2015-05-28 – 2015-06-06 (×27): 2 g via INTRAVENOUS
  Filled 2015-05-28 (×29): qty 100

## 2015-05-28 MED ORDER — FLUCONAZOLE 200 MG PO TABS *I*
400.0000 mg | ORAL_TABLET | Freq: Every day | ORAL | Status: DC
Start: 2015-05-29 — End: 2015-05-29
  Filled 2015-05-28: qty 2

## 2015-05-28 MED ORDER — LORAZEPAM 0.5 MG PO TABS *I*
0.5000 mg | ORAL_TABLET | Freq: Three times a day (TID) | ORAL | Status: AC
Start: 2015-05-28 — End: 2015-05-29
  Administered 2015-05-28: 0.5 mg via ORAL
  Filled 2015-05-28: qty 1

## 2015-05-28 MED ORDER — METOPROLOL TARTRATE 12.5 MG PO CAPS *I*
12.5000 mg | ORAL_CAPSULE | Freq: Four times a day (QID) | ORAL | Status: DC
Start: 2015-05-29 — End: 2015-05-30
  Administered 2015-05-29 – 2015-05-30 (×6): 12.5 mg via ORAL
  Filled 2015-05-28 (×12): qty 1

## 2015-05-28 MED ORDER — VANCOMYCIN HCL IN D5W 2000 MG/500 ML IV SOLN *I*
2000.0000 mg | Freq: Once | INTRAVENOUS | Status: AC
Start: 2015-05-28 — End: 2015-05-28
  Administered 2015-05-28: 2000 mg via INTRAVENOUS
  Filled 2015-05-28: qty 500

## 2015-05-28 MED ORDER — DIPHENHYDRAMINE HCL 50 MG/ML IJ SOLN *I*
25.0000 mg | Freq: Four times a day (QID) | INTRAMUSCULAR | Status: DC | PRN
Start: 2015-05-28 — End: 2015-06-02

## 2015-05-28 MED ORDER — VORICONAZOLE 200 MG PO TABS *I*
200.0000 mg | ORAL_TABLET | Freq: Two times a day (BID) | ORAL | Status: DC
Start: 2015-05-29 — End: 2015-05-28

## 2015-05-28 MED ORDER — SODIUM CHLORIDE 0.9 % IV BOLUS *I*
500.0000 mL | Freq: Once | Status: AC
Start: 2015-05-29 — End: 2015-05-29
  Administered 2015-05-28: 500 mL via INTRAVENOUS

## 2015-05-28 MED ORDER — SIMETHICONE 80 MG PO CHEW *I*
80.0000 mg | CHEWABLE_TABLET | Freq: Four times a day (QID) | ORAL | Status: AC
Start: 2015-05-28 — End: 2015-06-05
  Administered 2015-05-28 – 2015-06-04 (×26): 80 mg via ORAL
  Filled 2015-05-28 (×28): qty 1

## 2015-05-28 MED ORDER — SODIUM CHLORIDE 0.9 % IV SOLN WRAPPED *I*
500.0000 mL | Status: AC
Start: 2015-05-28 — End: 2015-05-28
  Administered 2015-05-28: 500 mL via INTRAVENOUS

## 2015-05-28 MED ORDER — DOCUSATE SODIUM 100 MG PO CAPS *I*
200.0000 mg | ORAL_CAPSULE | Freq: Two times a day (BID) | ORAL | Status: DC
Start: 2015-05-28 — End: 2015-06-01
  Administered 2015-05-29 – 2015-05-30 (×3): 200 mg via ORAL
  Filled 2015-05-28 (×5): qty 2

## 2015-05-28 NOTE — Plan of Care (Signed)
(  1900-0700) Tachycardic from 150s to 180s,  tachypneic to 30s, and elevated BP all night (see flowsheets for details), Dr.Ruebeck aware of situation, EKG done, pt placed on tely, given 500cc bolus, started on lopressor, placed on 3L NC for comfort, antifungal started & CT scan done. Decreased urine output & WBC up to 1.1 from 0.1, Dr. Raliegh Scarlet notified.Given albuterol neb for SOB, wheezing with moderate effect. Will continue to monitor and provide handoff to oncoming nurse for continuity of care.          Comfort - BMT-related     Nausea controlled or eliminated Maintaining     Diarrhea controlled or eliminated Maintaining     Without infection Maintaining        Graft Versus Host Disease (GVHD) Bundle     Without rash Maintaining     Stool output within parameters (</= 530ml in 24 h) Maintaining     Total bilirubin </= 2 Maintaining        Mobility     Patient's functional status is maintained or improved Maintaining        Nutrition     Patient's nutritional status is maintained or improved Maintaining        Pain/Comfort     Patient's pain or discomfort is manageable Maintaining        Psychosocial     Demonstrates ability to cope with illness Maintaining        Safety     Patient will remain free of falls Maintaining

## 2015-05-28 NOTE — Progress Notes (Addendum)
[facility] Transplant Program APP Progress Note    CC   Carly Higgins is a 55 y.o. 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 By [employer] testing, she has mutations of TET2, ASXL1, CCND2, GATA2, SRSF2.     Admitted for MUD allogeneic PBSC transplant.     Length of stay  LOS: 17 days     Interval History   [name]  C/o nausea; using ATC zofran and ativan and prn phenergan with relief  No appetite d/t nausea and mucositis.   No bm for 3 days; active bowel sounds and passing flatus  Tachycardic to 160's. Increased O2 need- 2L NC  BP are stable.  ekg x2 done.  548m bolus- no effect.   Pan Cx   cxr- pulmonary edema vs infection   Lactate- 1.1   UA- still waiting for sample   cx- NGTD   1 time dose 1gm vanco  T.bili decreased to 5.4.  Hold MTX today  Change abx from cipro to cefepime  tacro 8.3- no changes     Review of Systems   A comprehensive review of 13 systems was performed; pertinent positives checked below:  []  fevers  []  fast pulse [x]  fatigue   []  chills []  chest pain []  dysuria    []  dry eyes [x]  nausea/emesis  []  rash   []  ear pain []  abdominal cramping []  skin changes   []  sores in mouth [x]  abdominal discomfort [x]  pain   [x]  sore throat []  loose stool, non-formed []  none of the above   []  cough  []  diarrhea, watery    []  sputum production  []  weight loss    []  shortness of breath [x]  insufficient oral intake      Vital Signs and I&O   Temp:  [36 C (96.8 F)-37.1 C (98.8 F)] 36.8 C (98.2 F)  Heart Rate:  [106-162] 162  Resp:  [18-32] 32  BP: (133-144)/(63-91) 138/89    Intake/Output Summary (Last 24 hours) at 05/28/15 1651  Last data filed at 05/28/15 09811  Gross per 24 hour   Intake          1078.68 ml   Output              353 ml   Net           725.68 ml        Physical Exam     Head:  Normocephalic, atraumatic   Eyes:  Conjunctiva/corneas clear   Throat: Oral mucosa whitish, edematous, dry; erythematous lesions in posterior  pharynx   Lungs:   CTA B/L, tachypneic   Heart:  tachycardic   Abdomen:   distended, diffusely tender, BS present   Extremities: No edema   Pulses: 2+ and symmetric   Skin: No rashes or lesions, jaundice   Neurologic: A&O X 3, CMS intact   Central venous access: IVAD - C/D/I      Assessment & Plan   KKYLII ENNISis a 55yo female with unclassifiable myeloproliferative neoplasm.  By [employer] testing, she has mutations of TET2, ASXL1, CCND2, GATA2, SRSF2. Admitted for MUD allogeneic PBSCT.     Atypical MPN  Completed Decitabine 2110mm2 IV daily x 5 days - Cycle # 1 12/15/14  On intermittent/dose adjusted hydroxyurea at admission - discontinued 5/5 in setting of falling WBC     [name] thru -2 (5/4-5/7):  Flu 4068m2, Bu 130m78m - Busulfan dose adjusted to  272m for last 2 doses (from 2487m  [name] (5/8): Rest day  [name] (5/9): MUD (10/10 HLA, female) allo PBSCT, ABO: A+/A+, CMV: neg/neg     Today is BMT day +11    Pancytopenia  Transfuse 1 unit RBCs for hematocrit <21% and/or transfuse 1 unit platelets for platelet count <11,000 OR transfuse 1 unit platelets for platelet count <20,000 if bleeding or fever.  Helmet for safety during periods of thrombocytopenia, per hospital policy    DVT ppx  Enoxaparin - on hold for plts <30K     ID  At FIRST temperature spike >37.9   Order cultures  Obtain blood cultures (1 set of bactacs) from each central line port & one set of blood cultures drawn by venipuncture   Begin broad spectrum antibiotics with cefepime and flagyl (follow febrile neutropenia SOP)    5/19: cipro/flagyl for cholecystitis; Tbili of 6.1  5/20: stop cipro.  Add cefepime.  Vanco 2gm for one dose     cxr- pulmonary edema vs infection    Lactate- 1.1    UA- still waiting for sample    cx- NGTD    Viral prophylaxis with acyclovir  Fungal prophylaxis with fluconazole    CV/pulm  Albuterol, Duonebs PRN wheezing   Tachycardic to 160's---not responsive to fluid bolus of 500cc  Increase MIVF to 10092mr  CXR  shows pulmonary edema vs atypical infection; treat as infection right now    Endo  Hyperglycemia resolved - discontinue SSI    GVHD  Planned GVHD prophylaxis with tacrolimus and methotrexate  Tacro by continuous IV infusion beginning on day-1 via PIV  Target tacrolimus level 8-12    Methotrexate will dose equivalent leucovorin will be adminstered as follows:   15 mg/m2 on day +1 (>24 hrs from stem cell infusion) (full dose given)  10 mg/m2 on days +3, +6, and +11     held day 11 MTX d/t mucositis and t.biliof 5.4    Renal  Creatinine at admission was 0.7, stable  Encouraged PO intake   Daily 1L bolus, hold for PO intake >1500cc      Electrolyte imbalance  Replace electrolytes as to the following parameters  Hypokalemia, supplement when serum K <3.6  Hypomagnesemia, supplement when serum Mag <1.5   Hypocalcemia, supplement when corrected calcium <8.9     At risk for malnutrition/hypoalbuminemia  Regular diet  Albumin level on admission 4.3  Nutrition following     GI  ATC Zofran and ativan    PRN Phenergan, simethicone   Protonix BID for GERD, stress ulcer ppx  PRN Tums, carafate for GERD   PRN Tucks, Anusol for rectal/anal pain  Monitor bowel movements    VOD/SOS  Moderate risk with ablative BU/Flu  Ursodiol ppx  5/19 T.Bili 6.1 , jaundice, nausea  Abdominal doppler US [place]- cw cholecystitis. Start cipro/flagyl  5/20: t.bili 5.4. Stop cipro. Start cefepime    Mucositis  WHO Grade 3  Dilaudid PCA- making very nauseous  5/19 Change to morphine pca; decreased d/t somnolence   Cont good oral care, normal saline rinses  PRN BMX    GU  Hematuria x1;  no recurrence    At risk for deconditioning   Encouraged physical activity, ambulation  Consult PT if needed    Psychosocial  Cont emotional support     Smoking Cessation  Offered nicotine replacement & [place] [facility]; patient declining at this time    Dispo planning  Anticipate discharge following engraftment and clinical  stability  BMT consent  signed  BMT attending [name]  Primary care physician [name]  RN coordinator Anne/Sharon  Lives in [place]    Patient seen and discussed with Dr. Soledad Gerlach, NP    Lab Data      Lab results: 05/28/15  1478 05/27/15  0020 05/26/15  2956 05/25/15  2130 05/24/15  8657 05/23/15  0139 05/22/15  8469  05/19/15  0225  05/17/15  0345 05/16/15  6295 05/15/15  0112 05/14/15  0106 05/13/15  0107 05/12/15  0527 05/11/15  1649   WBC 0.1* 0.1* 0.1* 0.2* 0.6* 0.8* 1.2*  < > 2.7*  < > 3.3* 7.6 19.5* 35.4* 44.3* 56.1* 43.7*   Hemoglobin 6.9* 6.9* 7.1* 6.9* 5.8* 6.5* 6.0*  < > 7.5*  < > 7.6* 8.5* 8.3* 7.9* 6.5* 7.2* 7.1*   Hematocrit 19* 19* 20* 20* 17* 19* 17*  < > 22*  < > 23* 26* 26* 25* 21* 23* 22*   RBC 2.4* 2.4* 2.4* 2.4* 2.0* 2.3* 2.1*  < > 2.6*  < > 2.6* 3.0* 2.9* 2.7* 2.2* 2.5* 2.4*   Platelets 18* 10* 8* 15* 9* 7* 11*  < > 37*  < > 36* 39* 47* 47* 54* 62* 59*   Neut # K/uL  --   --   --  0.1* 0.4* 0.7* 1.1*  < > 2.6  < > 3.2 7.0* 17.8* 29.0* 32.8* 38.7* 27.1*   Lymph # K/uL  --   --   --  0.1* 0.2* 0.1* 0.1*  < > 0.1*  < > 0.0* 0.2* 0.4* 1.1* 2.2 4.5* 2.6   Mono # K/uL  --   --   --  0.0* 0.0* 0.0* 0.0*  < > 0.0*  < > 0.0* 0.0* 0.0* 0.3 0.0* 1.4* 0.4   Eos # K/uL  --   --   --  0.0 0.0 0.0 0.0  < > 0.0  < > 0.0 0.0 0.0 0.0 0.4 0.0 0.0   Baso # K/uL  --   --   --  0.0 0.0 0.0 0.0  < > 0.0  < > 0.0 0.0 0.0 0.0 0.0 0.0 0.0   Seg Neut %  --   --   --  52.1 71.4 81.7 88.0  < > 94.7  < > 94.6 90.2 87.1 71.9 56.0 49.3 53.5   Lymphocyte %  --   --   --  47.9 25.4 18.3 11.1  < > 4.4  < > 0.9 1.8 1.7 2.6 5.3 7.6 6.1   Monocyte %  --   --   --  0.0 1.6 0.0 0.0  < > 0.0  < > 0.0 0.0 0.0 0.9 0.0 2.5 0.9   Eosinophil %  --   --   --  0.0 0.0 0.0 0.0  < > 0.0  < > 0.0 0.0 0.0 0.0 0.9 0.0 0.0   Basophil %  --   --   --  0.0 0.0 0.0 0.0  < > 0.0  < > 0.0 0.0 0.0 0.0 0.0 0.0 0.0   Bands %  --   --   --   --   --   --  1  --  1  --  1 2 4 10  18* 20* 8   Myelocyte %  --   --   --   --   --   --   --   --   --    --  2* 2* 2* 9* 4* 2* 12*   Metamyelocyte %  --   --   --   --   --   --   --   --   --   --  2* 4* 5* 4* 13* 17* 12*   Promyelocyte %  --   --   --   --   --   --   --   --   --   --   --   --   --  2* 1*  --  4*   Blasts %  --   --   --   --   --   --   --   --   --   --   --   --   --   --  2* 2* 3*   < > = values in this interval not displayed.            Lab results: 05/28/15  0018 05/27/15  0020 05/26/15  0315   Sodium 135 133 133   Potassium 4.3 4.8 5.1   Chloride 97 97 98   CO2 22 22 22    UN 15 13 17    Creatinine 0.68 0.64 0.76   GFR,Caucasian 99 101 89   GFR,Black 114 116 102   Glucose 162* 152* 150*   Calcium 8.3* 8.4* 8.4*           Lab results: 05/28/15  0018   Magnesium 1.1*       Lab Results   Component Value Date    ALT 32 05/28/2015    AST 19 05/28/2015     Lab Results   Component Value Date    ALK 65 05/28/2015     Lab Results   Component Value Date    TB 5.4 (H) 05/28/2015     LD   Date Value Ref Range Status   05/27/2015 186 118 - 225 U/L Final     LD, PL   Date Value Ref Range Status   05/09/2015 1146 (H) 118 - 225 U/L Final     LDL Calculated   Date Value Ref Range Status   08/27/2013 113 mg/dL Final     Comment:     REFERENCE RANGE:  < 100 Optimal                  100-129 Near or above optimal                  130-159 Borderline High                  160-189 High                    > 189 Very High         I saw and evaluated the patient. I agree with the NP's findings and plan of care as documented above. KAILENE STEINHART is a 55 yo female with unclassifiable myeloproliferative neoplasm who is day +11 of MAC BUFLU and MUD allogeneic PBSCT. Her post-transplant course has been complicated by oral/anal mucositis, diarrhea, direct hyperbilirubinemia related to biliary process.    S: She was noticed to have tachycardia. Overall, her mouth/throat pain is slightly better but she is having more abdominal pain and nausea.     O:  Ms. Tarlton was laying down in her bed in distress. Oral mucosa with  evidence of mucosal hypertrophy, swollen tongue, and erythema  and ulceration involving the posterior hypopharynx. Clear lungs with no w/c, audible heart sounds with tachycardia, but no murmur or gallops, soft with tender right upper quadrant, and no leg edema.     Lab.: reviewed. T Bili down to 54.  : US abdomen limited single quad or f/u specify   Status: Preliminary result   PACS Images   Show images for US abdomen limited single quad or f/u specify   Reading Radiologist: Laverle Hobby, MBBS  Silvano Rusk, MD     IMPRESSION:      Normal liver appearance without focal hepatic lesions. Patent hepatic    and portal vasculature.       Gallbladder sludge with distention and wall thickening without    tenderness in the right upper quadrant.      Splenomegaly.          A/P:  TERRIS BODIN is a 55 yo female with unclassifiable myeloproliferative neoplasm who is day +11 of MAC BUFLU and MUD allogeneic PBSCT. Her post-transplant course has been complicated by oral and anal mucositis, grade III, diarrhea, direct hyperbilirubinemia with US findings of biliary process, and now tachycardia. The plan is to broaden her antibiotic coverage by starting cefepime and stopping ciprofloxacin. We will continue flagyl. We will increase her IVF rate as she might be getting septic. Continue actigall. Continue morphine PCA for pain control for mucositis, but consider tapering as pain improves. For tachycardia, we will check an EKG and consider tele and cardiac consult as needed. We continue daily labs to assess for electrolyte replacements, tac adjustments and blood transfusion needs. Continue prophylactic antibiotics, but we will continue to hold fluconazole for now. Continue tac and give methotrexate as planned on days 1,3, 6, for GVHD prophylaxis, however day +11 dose will be held given significant hyperbilirubinemia. Encouraged sitting up in chair and trying PO intake as possible.

## 2015-05-28 NOTE — Plan of Care (Signed)
Nutrition     Patient's nutritional status is maintained or improved Goal not met          Comfort - BMT-related     Diarrhea controlled or eliminated Maintaining     Without infection Maintaining        Graft Versus Host Disease (GVHD) Bundle     Without rash Maintaining     Stool output within parameters (</= 557m in 24 h) Maintaining     Total bilirubin </= 2 Maintaining        Psychosocial     Demonstrates ability to cope with illness Maintaining        Safety     Patient will remain free of falls Maintaining          Comfort - BMT-related     Nausea controlled or eliminated Progressing towards goal        Mobility     Patient's functional status is maintained or improved Progressing towards goal        Pain/Comfort     Patient's pain or discomfort is manageable Progressing towards goal          Pt tachycardic throughout shift to 163, Deana NP notified, x1 500cc bolus of NS given w/ minimal effect, EKG x3 done throughout shift, no new orders at this time. Tachypneic to 36 throughout shift, Deana NP notified, PRN nebulizer given x1 for shortness of breath and expiratory wheezing w/ minimal effect. Started on 3L NC for comfort, satting 98%. Pt hypertensive to 152/94, Deana NP notified, no new orders at this time. AOVSS on room air. Pt reported auditory and visual hallucinations intermittently throughout shift, Deana NP notified, morning dose of ativan held, per providers, and following doses decreased. Pt reoriented by wProbation officerand reinforced using call bell when needing to get out of bed throughout rest of shift. Pt cx, lactate sent, and CXR done, see orders for details. Pt having malordorous hematuria w/ urination, Deana NP notified, UA still needs to be sent. Methotrexate held today (day +11), per providers, d/t t.bill 5.4. Will continue to monitor and provide handoff to oncoming nurse for continuity of care.

## 2015-05-28 NOTE — Interdisciplinary Rounds (Signed)
Interdisciplinary Rounds Note    Date: 05/28/2015   Time: 10:08 AM   Attendance:  Attending, PA, Charge RN    Admit Date/Time:  05/11/2015  2:02 PM    Principal Problem: <principal problem not specified>  Problem List:   Patient Active Problem List    Diagnosis Date Noted    Myeloproliferative disease 05/11/2015    MPN (myeloproliferative neoplasm) 01/19/2014    Leukocytosis 09/22/2013       The patient's problem list and interdisciplinary care plan was reviewed.    Discharge Planning                 *Does patient currently have home care services?: No     *Current External Services: None                      Plan    05/14/15 Day -3.  Will receive Flu/Bu tonight.  Pt tolerating chemo well.  Continue supportive care.   5/7: doing well, continue supportive care  5/8:  [name].  Mild nausea, fatigue.  Continue supportive care.  05/17/2015 [name], WBC 3.3, ANC 3.2, Hct 23, plts 36, will receive MUD PBSCs today, continue supportive care.  05/18/2015 Day +1 WBC 3.9, ANC 3.7, hct 21, plts 52K, received MUD PBSCs yesterday, tolerated well, MTX given per protocol, continue supportive care.  05/19/15:  Day +2.  Counts dropping with ANC 2600 today.  Glucose control with BG's and SS insulin coverage d/t steroids.  Continue supportive care.  05/20/15 D +3  Labs stable/falling. Multiple episodes of vomiting yesterday, abd discomfort.   Continue supportive care.  5/13: Day +4. BMX for mucositis. Dizzy- 500 ml bolus and add daily prn bolus. Helmet/assist out of bed.  05/22/15: Day +5 today, c/o rectal pain after having diarrhea, given topicals to alleviate burning discomfort, feeling better today. Continue supportive care.  05/23/15:  Day +6.  Mild complaints of nausea and diarrhea.  Check stool for C-Diff and if negative will start imodium.  Complaining of rectal pain - adjust medications to control discomfort.  Due fpr MTX today.  ANC 700,  Plt 7 K and hct 19 - transfused with both products today. Continue supportive care.  05/24/2015 Day  +7 WBC 0.6, ANC 0.4, hct 17, plts 9K, transfused with plts, will receive PRBCs, complaining of mucusitis pain, not eating, encouraged pt to take pain medication, also needs encouragement to get OOB, otherwise continue supportive care.  05/25/2015 Day +8 WBC 0.2, ANC 0.1, hct 20, plts 15K, complaining of increased pain for which the dilaudid helps but does not last per pt, starting PCA, continue other supportive measures.  05/26/15:  Day +9, nadired.  Receiving blood and plts today.  Will stop BG's and SS insulin coverage.  Encourage patient increase activity.  Continue supportive care.  05/27/15: Day +10.  Nadired, plts 10K and hct 19.  T.bili today 6.1.  Ursodiol increased to tid.  U/S of liver today.  Check PT/PTT.  Mucositis worse, tongue very swollen.  Continue IVF to hydrate.  Change diflucan to caspofungin as may contribute to increasing T.bili further.  Change dilaudid PCA to Morphine d/t persistent nausea. Continue supportive care.  05/28/15: [name] T-bili still increased at 5.4. No MTX today. Tachy to 150s this AM, bolus given. Still having persistent nausea, cont with antiemetics. Continue supportive care.      Anticipated Discharge Date:  Discharge Disposition: Home, choiced to [facility]

## 2015-05-28 NOTE — Plan of Care (Addendum)
Call to eval pt for sinus tachycardia fluctuating around 130-170bpm.Chart reviewed. Normotensive. Lactate 1.1 earlier. Pt currently on azithromycin, cefepime, and vancomycin. Bili improving. UA 3+ bacteria but negative LE/Nitrite. Given about 3L today. EKG earlier was ST. Currently pt is sitting up in bed. She denies CP, N/V/D, dysuria, rash. It is difficult to localize a source. Her exam is notable for dry MM, tachyardia w/o murmurs, lungs CTAB, abd soft NTND. Start tele. Give 500cc bolus NS. Will start low dose metoprolol 12.5mg  q6hr given she is perfusing well except for LUOP. HR this high can lead to flash pulm edema, demand mediated NSTEMI and cardiomyopathy. Concerned for possible fungal infection given she is off of anti-fungals. Will hold off on azoles d/t elevated bili. Start caspofungin 70mg  loading dose and 50mg  daily thereafter but will need ID approval. Appreciate pharmacy assistance. Monitor LFTs. Obtain CTA to r/o PE     Elesa Hacker, MD on 05/28/2015 at 11:01 PM  xPIC [id number], please text page with questions or concerns

## 2015-05-29 ENCOUNTER — Inpatient Hospital Stay: Payer: Self-pay

## 2015-05-29 ENCOUNTER — Other Ambulatory Visit: Payer: Self-pay | Admitting: Gastroenterology

## 2015-05-29 LAB — ECHO COMPLETE
Aortic Diameter (mid tubular): 3.3 cm
Aortic Diameter (sinus of Valsalva): 2.8 cm
BMI: 43.7 kg/m2
BP Diastolic: 92 mmHg
BP Systolic: 146 mmHg
BSA: 2.39 m2
Echo RV Stroke Work Index Estimate: 401.7 mmHg•mL/m2
Heart Rate: 165 {beats}/min
Height: 66 in
LA Systolic Vol BSA Index: 32.2 mL/m2
LA Systolic Vol Height Index: 45.9 mL/m
LA Systolic Volume: 77 mL
LV ASE Mass BSA Index: 74.5 gm/m2
LV ASE Mass Height 2.7 Index: 44.2 gm/m2.7
LV ASE Mass Height Index: 106.3 gm/m
LV ASE Mass: 178.2 gm
LV CO BSA Index: 3.31 L/min/m2
LV Cardiac Output: 7.92 L/min
LV Diastolic Volume Index: 33.5 mL/m2
LV Posterior Wall Thickness: 1.2 cm
LV SV - LVOT SV Diff: 3.41 mL
LV SV BSA Index: 20.1 mL/m2
LV SV Height Index: 28.6 mL/m
LV Septal Thickness: 1.2 cm
LV Stroke Volume: 48 mL
LV Systolic Volume Index: 13.4 mL/m2
LVED Diameter BSA Index: 1.8 cm/m2
LVED Diameter Height Index: 2.5 cm/m
LVED Diameter: 4.2 cm
LVED Volume BSA Index: 33 ml/m2
LVED Volume BSA Index: 33.5 mL/m2
LVED Volume Height Index: 47.7 mL/m
LVED Volume: 80 mL
LVEF (Volume): 60 %
LVES Volume BSA Index: 13 ml/m2
LVES Volume BSA Index: 13.4 mL/m2
LVES Volume Height Index: 19.1 mL/m
LVES Volume: 32 mL
LVOT Area (calculated): 3.14 cm2
LVOT Cardiac Index: 3.08 L/min/m2
LVOT Cardiac Output: 7.36 L/min
LVOT Diameter: 2 cm
LVOT PWD VTI: 14.2 cm
LVOT SV BSA Index: 18.66 mL/m2
LVOT SV Height Index: 26.6 mL/m
LVOT Stroke Volume: 44.59 cc
MR Regurgitant Fraction (LV SV Mtd): 0.07
MR Regurgitant Volume (LV SV Mtd): 3.4 mL
Peak Gradient - TR: 20 mmHg
Pulmonary Vascular Resistance Estimate: 2.5 mmHg
RA Pressure Estimate: 10 mmHg
RR Interval: 363.64 ms
RV Peak Systolic Pressure: 30 mmHg
Weight: 4320 oz

## 2015-05-29 LAB — HEPATIC FUNCTION PANEL
ALT: 30 U/L (ref 0–35)
AST: 15 U/L (ref 0–35)
Albumin: 3.2 g/dL — ABNORMAL LOW (ref 3.5–5.2)
Alk Phos: 67 U/L (ref 35–105)
Bili,Indirect: 0.7 mg/dL (ref 0.1–1.0)
Bilirubin,Direct: 4.3 mg/dL — ABNORMAL HIGH (ref 0.0–0.3)
Bilirubin,Total: 5 mg/dL — ABNORMAL HIGH (ref 0.0–1.2)
Total Protein: 5.4 g/dL — ABNORMAL LOW (ref 6.3–7.7)

## 2015-05-29 LAB — CBC AND DIFFERENTIAL
Baso # K/uL: 0 10*3/uL (ref 0.0–0.1)
Basophil %: 0 %
Eos # K/uL: 0 10*3/uL (ref 0.0–0.4)
Eosinophil %: 0 %
Hematocrit: 22 % — ABNORMAL LOW (ref 34–45)
Hemoglobin: 7.9 g/dL — ABNORMAL LOW (ref 11.2–15.7)
Lymph # K/uL: 0.2 10*3/uL — ABNORMAL LOW (ref 1.2–3.7)
Lymphocyte %: 17.3 %
MCH: 28 pg/cell (ref 26–32)
MCHC: 36 g/dL (ref 32–36)
MCV: 79 fL (ref 79–95)
Mono # K/uL: 0.4 10*3/uL (ref 0.2–0.9)
Monocyte %: 39.5 %
Neut # K/uL: 0.4 10*3/uL — ABNORMAL LOW (ref 1.6–6.1)
Nucl RBC # K/uL: 0 10*3/uL (ref 0.0–0.0)
Nucl RBC %: 3.8 /100 WBC — ABNORMAL HIGH (ref 0.0–0.2)
Platelets: 32 10*3/uL — ABNORMAL LOW (ref 160–370)
RBC: 2.8 MIL/uL — ABNORMAL LOW (ref 3.9–5.2)
RDW: 15.9 % — ABNORMAL HIGH (ref 11.7–14.4)
Seg Neut %: 30.9 %
WBC: 1.1 10*3/uL — ABNORMAL LOW (ref 4.0–10.0)

## 2015-05-29 LAB — BASIC METABOLIC PANEL
Anion Gap: 15 (ref 7–16)
CO2: 21 mmol/L (ref 20–28)
Calcium: 8.3 mg/dL — ABNORMAL LOW (ref 8.6–10.2)
Chloride: 98 mmol/L (ref 96–108)
Creatinine: 0.82 mg/dL (ref 0.51–0.95)
GFR,Black: 93 *
GFR,Caucasian: 81 *
Glucose: 172 mg/dL — ABNORMAL HIGH (ref 60–99)
Lab: 23 mg/dL — ABNORMAL HIGH (ref 6–20)
Potassium: 4.4 mmol/L (ref 3.3–5.1)
Sodium: 134 mmol/L (ref 133–145)

## 2015-05-29 LAB — DIFF MANUAL
Bands %: 9 % (ref 0–10)
Diff Based On: 81 CELLS
Metamyelocyte %: 3 % — ABNORMAL HIGH (ref 0–1)
Myelocyte %: 1 % — ABNORMAL HIGH (ref 0–0)

## 2015-05-29 LAB — RED BLOOD CELLS
Coded Blood type: 6200
Component blood type: A POS
Dispense status: TRANSFUSED

## 2015-05-29 LAB — TACROLIMUS LEVEL: Tacrolimus: 10.6 ng/mL

## 2015-05-29 LAB — MAGNESIUM: Magnesium: 1.4 mEq/L (ref 1.3–2.1)

## 2015-05-29 LAB — RBC MORPHOLOGY

## 2015-05-29 MED ORDER — IOHEXOL 350 MG/ML (OMNIPAQUE) IV SOLN *I*
1.0000 mL | Freq: Once | INTRAVENOUS | Status: AC
Start: 2015-05-29 — End: 2015-05-29
  Administered 2015-05-29: 70 mL via INTRAVENOUS

## 2015-05-29 MED ORDER — SODIUM CHLORIDE 0.9 % IV SOLN WRAPPED *I*
50.0000 mg | INTRAVENOUS | Status: DC
Start: 2015-05-30 — End: 2015-06-03
  Administered 2015-05-30 – 2015-06-03 (×5): 50 mg via INTRAVENOUS
  Filled 2015-05-29 (×7): qty 7.14

## 2015-05-29 MED ORDER — FUROSEMIDE 10 MG/ML IJ SOLN *I*
40.0000 mg | Freq: Once | INTRAMUSCULAR | Status: AC
Start: 2015-05-29 — End: 2015-05-29

## 2015-05-29 MED ORDER — FUROSEMIDE 10 MG/ML IJ SOLN *I*
INTRAMUSCULAR | Status: AC
Start: 2015-05-29 — End: 2015-05-29
  Administered 2015-05-29: 40 mg via INTRAVENOUS
  Filled 2015-05-29: qty 4

## 2015-05-29 MED ORDER — SODIUM CHLORIDE 0.9 % IV SOLN WRAPPED *I*
70.0000 mg | Freq: Once | INTRAVENOUS | Status: AC
Start: 2015-05-29 — End: 2015-05-29
  Administered 2015-05-29: 70 mg via INTRAVENOUS
  Filled 2015-05-29: qty 10

## 2015-05-29 MED ORDER — LEVALBUTEROL HCL 0.63 MG/3ML IN NEBU *I*
0.6300 mg | INHALATION_SOLUTION | Freq: Four times a day (QID) | RESPIRATORY_TRACT | Status: DC | PRN
Start: 2015-05-29 — End: 2015-06-07
  Administered 2015-05-29: 0.63 mg via RESPIRATORY_TRACT
  Filled 2015-05-29 (×40): qty 3

## 2015-05-29 MED ORDER — IPRATROPIUM-ALBUTEROL 0.5-2.5 MG/3ML IN SOLN *I*
3.0000 mL | Freq: Four times a day (QID) | RESPIRATORY_TRACT | Status: DC
Start: 2015-05-29 — End: 2015-06-07
  Administered 2015-05-30 – 2015-06-07 (×26): 3 mL via RESPIRATORY_TRACT
  Filled 2015-05-29 (×39): qty 3

## 2015-05-29 NOTE — Interdisciplinary Rounds (Signed)
Interdisciplinary Rounds Note    Date: 05/29/2015   Time: 10:28 AM   Attendance:  Attending, PA, Charge RN    Admit Date/Time:  05/11/2015  2:02 PM    Principal Problem: <principal problem not specified>  Problem List:   Patient Active Problem List    Diagnosis Date Noted    Myeloproliferative disease 05/11/2015    MPN (myeloproliferative neoplasm) 01/19/2014    Leukocytosis 09/22/2013       The patient's problem list and interdisciplinary care plan was reviewed.    Discharge Planning                 *Does patient currently have home care services?: No     *Current External Services: None                      Plan    05/14/15 Day -3.  Will receive Flu/Bu tonight.  Pt tolerating chemo well.  Continue supportive care.   5/7: doing well, continue supportive care  5/8:  Day -1.  Mild nausea, fatigue.  Continue supportive care.  05/17/2015 Day 0, WBC 3.3, ANC 3.2, Hct 23, plts 36, will receive MUD PBSCs today, continue supportive care.  05/18/2015 Day +1 WBC 3.9, ANC 3.7, hct 21, plts 52K, received MUD PBSCs yesterday, tolerated well, MTX given per protocol, continue supportive care.  05/19/15:  Day +2.  Counts dropping with ANC 2600 today.  Glucose control with BG's and SS insulin coverage d/t steroids.  Continue supportive care.  05/20/15 D +3  Labs stable/falling. Multiple episodes of vomiting yesterday, abd discomfort.   Continue supportive care.  5/13: Day +4. BMX for mucositis. Dizzy- 500 ml bolus and add daily prn bolus. Helmet/assist out of bed.  05/22/15: Day +5 today, c/o rectal pain after having diarrhea, given topicals to alleviate burning discomfort, feeling better today. Continue supportive care.  05/23/15:  Day +6.  Mild complaints of nausea and diarrhea.  Check stool for C-Diff and if negative will start imodium.  Complaining of rectal pain - adjust medications to control discomfort.  Due fpr MTX today.  ANC 700,  Plt 7 K and hct 19 - transfused with both products today. Continue supportive care.  05/24/2015 Day  +7 WBC 0.6, ANC 0.4, hct 17, plts 9K, transfused with plts, will receive PRBCs, complaining of mucusitis pain, not eating, encouraged pt to take pain medication, also needs encouragement to get OOB, otherwise continue supportive care.  05/25/2015 Day +8 WBC 0.2, ANC 0.1, hct 20, plts 15K, complaining of increased pain for which the dilaudid helps but does not last per pt, starting PCA, continue other supportive measures.  05/26/15:  Day +9, nadired.  Receiving blood and plts today.  Will stop BG's and SS insulin coverage.  Encourage patient increase activity.  Continue supportive care.  05/27/15: Day +10.  Nadired, plts 10K and hct 19.  T.bili today 6.1.  Ursodiol increased to tid.  U/S of liver today.  Check PT/PTT.  Mucositis worse, tongue very swollen.  Continue IVF to hydrate.  Change diflucan to caspofungin as may contribute to increasing T.bili further.  Change dilaudid PCA to Morphine d/t persistent nausea. Continue supportive care.  05/28/15: Day +11 T-bili still increased at 5.4. No MTX today. Tachy to 150s this AM, bolus given. Still having persistent nausea, cont with antiemetics. Continue supportive care.  05/29/15: Day +12. HR still in the 150s, ANC 1100 today. CT angio done last night and ECHO to be done today. Decreasing  fluids to 65ml/hr. Pt states feeling better this morning. D/C scopolamine patch d/t urinary retention. Continue supportive care.       Anticipated Discharge Date:  Discharge Disposition: Home, choiced to [facility]

## 2015-05-29 NOTE — Plan of Care (Signed)
Comfort - BMT-related     Nausea controlled or eliminated Maintaining     Diarrhea controlled or eliminated Maintaining     Without infection Maintaining        Graft Versus Host Disease (GVHD) Bundle     Without rash Maintaining     Stool output within parameters (</= 560ml in 24 h) Maintaining     Total bilirubin </= 2 Maintaining        Mobility     Patient's functional status is maintained or improved Maintaining        Nutrition     Patient's nutritional status is maintained or improved Maintaining        Pain/Comfort     Patient's pain or discomfort is manageable Maintaining        Psychosocial     Demonstrates ability to cope with illness Maintaining        Safety     Patient will remain free of falls Maintaining          (0700-1900) Pt O2 dropped to 70s on RA with noon VS, pt placed on venti mask, on 35% sating 94%. Pt continues with tachycardia (160s) and tachypnia (20s). Providers made aware, pt received 1x dose of 40mg  lasix per order. Throughout shift writer was able to wean patient to 3L NC, currently sating 100%. Pt received prn phenergan per order for nausea with moderate effect. Will continue to monitor and pass on continuity of care.

## 2015-05-29 NOTE — Progress Notes (Addendum)
Blood & Marrow Transplant Program APP Progress Note    CC   Carly Higgins is a 55 y.o. 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 By [employer] testing, she has mutations of TET2, ASXL1, CCND2, GATA2, SRSF2.     Admitted for MUD allogeneic PBSC transplant.     Length of stay  LOS: 18 days     Interval History   Day +12  Was feeling well this morning at rounds.  Was sitting at the side of the bed, 2L NC.    After rounds, she became hypoxic down to the high 70's requiring 35% venti mask.   She continues to be tachycardic to 150-160.    She was given 78m IV lasix, duoneb with good effect.  Foley was placed to monitor i/o  t bili decreased to 5  Remains jaundice  Echo done- no significant abnormalities  CTA done overnight- no PE, shows pneumonia  Started on 12.5 metoprolol q6h without any effect  O2 was able to be weaned down throughout the day after lasix. Now on 3L NC  Remains afebrile    Review of Systems   A comprehensive review of 13 systems was performed; pertinent positives checked below:  _0  fevers  _1  fast pulse _2  fatigue   _3  chills _4  chest pain _5  dysuria    _6  dry eyes _7  nausea/emesis  _8  rash   _9  ear pain _10  abdominal cramping _11  skin changes   _12  sores in mouth _13  abdominal discomfort _14  pain   _15  sore throat _16  loose stool, non-formed _17  none of the above   _18  cough  _19  diarrhea, watery    _20  sputum production  _21  weight loss    _22  shortness of breath _23  insufficient oral intake      Vital Signs and I&O   Temp:  [36.1 C (97 F)-37.1 C (98.8 F)] 36.8 C (98.2 F)  Heart Rate:  [158-181] 161  Resp:  [28-36] 28  BP: (117-152)/(85-104) 138/90  FiO2:  [24 %-35 %] 24 %    Intake/Output Summary (Last 24 hours) at 05/29/15 1751  Last data filed at 05/29/15 1658   Gross per 24 hour   Intake          5700.11 ml   Output             1490 ml   Net          4210.11 ml        Physical Exam     Head:  Normocephalic, atraumatic   Eyes:   Conjunctiva/corneas clear   Throat: Oral mucosa whitish, edematous, dry; erythematous lesions in posterior pharynx   Lungs:   Bilateral wheezing, tachypneic, labored breathing   Heart:  tachycardic   Abdomen:   distended, diffusely tender, BS present   Extremities: No edema   Pulses: 2+ and symmetric   Skin: No rashes or lesions, jaundice   Neurologic: A&O X 3, CMS intact   Central venous access: IVAD - C/D/I      Assessment & Plan   Carly WHITEis a 55yo female with unclassifiable myeloproliferative neoplasm.  By [employer] testing, she has mutations of TET2, ASXL1, CCND2, GATA2, SRSF2. Admitted for MUD allogeneic PBSCT.     Atypical MPN  Completed Decitabine 219mm2 IV daily x 5 days - Cycle # 1 12/15/14  On intermittent/dose adjusted hydroxyurea at admission - discontinued 5/5 in setting of falling WBC  Day -5 thru -2 (5/4-5/7):  Flu 66m/m2, Bu 1391mm2 - Busulfan dose adjusted to 2859mor last 2 doses (from 244m100mDay -1 (5/8): Rest day  Day 0 (5/9): MUD (10/10 HLA, female) allo PBSCT, ABO: A+/A+, CMV: neg/neg     Today is BMT day +12    Pancytopenia  Transfuse 1 unit RBCs for hematocrit <21% and/or transfuse 1 unit platelets for platelet count <11,000 OR transfuse 1 unit platelets for platelet count <20,000 if bleeding or fever.  Helmet for safety during periods of thrombocytopenia, per hospital policy    DVT ppx  Enoxaparin - on hold for plts <30K     ID  At FIRST temperature spike >37.9   Order cultures  Obtain blood cultures (1 set of bactacs) from each central line port & one set of blood cultures drawn by venipuncture   Begin broad spectrum antibiotics with cefepime and flagyl (follow febrile neutropenia SOP)    5/19: cipro/flagyl for cholecystitis; Tbili of 6.1  5/20: stop cipro.  Add cefepime.  Vanco 2gm for one dose     cxr- pulmonary edema vs infection    Lactate- 1.1    UA- still waiting for sample    cx- NGTD    Viral prophylaxis with acyclovir  Fungal prophylaxis with  fluconazole    CV/pulm  Albuterol, Duonebs ATC wheezing   Tachycardic to 160's---not responsive to fluid bolus of 500cc  CXR shows pulmonary edema vs atypical infection; treat as infection right now  5/21 Echo shows no abnormalities  MIVF stopped due to fluid overload/hypoxia  Lasix x1 40mg35men with good effect  Wean O2 as tolerated    Endo  Hyperglycemia resolved - discontinue SSI    GVHD  Planned GVHD prophylaxis with tacrolimus and methotrexate  Tacro by continuous IV infusion beginning on day-1 via PIV  Target tacrolimus level 8-12    Methotrexate will dose equivalent leucovorin will be adminstered as follows:   15 mg/m2 on day +1 (>24 hrs from stem cell infusion) (full dose given)  10 mg/m2 on days +3, +6, and +11     held day 11 MTX d/t mucositis and t.biliof 5.4    Renal  Creatinine at admission was 0.7, stable  Encouraged PO intake   Foley placed for I/O     Electrolyte imbalance  Replace electrolytes as to the following parameters  Hypokalemia, supplement when serum K <3.6  Hypomagnesemia, supplement when serum Mag <1.5   Hypocalcemia, supplement when corrected calcium <8.9     At risk for malnutrition/hypoalbuminemia  Regular diet  Albumin level on admission 4.3  Nutrition following     GI  ATC Zofran and ativan    PRN Phenergan, simethicone   Protonix BID for GERD, stress ulcer ppx  PRN Tums, carafate for GERD   PRN Tucks, Anusol for rectal/anal pain  Monitor bowel movements  Colace and senna added    VOD/SOS  Moderate risk with ablative BU/Flu  Ursodiol ppx  5/19 T.Bili 6.1 , jaundice, nausea  Abdominal doppler US do[place]- cw cholecystitis. Start cipro/flagyl  5/20: t.bili 5.4. Stop cipro. Start cefepime  5/21: t bili 5.0    Mucositis  WHO Grade 3  Dilaudid PCA- making very nauseous  5/19 Change to morphine pca; decreased d/t somnolence   5/21 decreased morphine pca- can d/c tomorrow if better   Cont good oral care, normal saline rinses  PRN BMX    GU  Hematuria x1;  no recurrence    At risk for  deconditioning   Encouraged physical activity, ambulation  Consult PT if needed    Psychosocial  Cont emotional support     Smoking Cessation  Offered nicotine replacement & [place] [facility]; patient declining at this time    Dispo planning  Anticipate discharge following engraftment and clinical stability  BMT consent signed  BMT attending [name]  Primary care physician [name]  RN coordinator Anne/Sharon  Lives in [place]    Patient seen and discussed with Dr. Soledad Gerlach, NP    Lab Data      Lab results: 05/29/15  0028 05/28/15  0018 05/27/15  0020  05/25/15  4098 05/24/15  1191  05/22/15  4782  05/19/15  0225  05/17/15  0345 05/16/15  9562  05/14/15  0106 05/13/15  0107 05/12/15  0527 05/11/15  1649   WBC 1.1* 0.1* 0.1*  < > 0.2* 0.6*  < > 1.2*  < > 2.7*  < > 3.3* 7.6  < > 35.4* 44.3* 56.1* 43.7*   Hemoglobin 7.9* 6.9* 6.9*  < > 6.9* 5.8*  < > 6.0*  < > 7.5*  < > 7.6* 8.5*  < > 7.9* 6.5* 7.2* 7.1*   Hematocrit 22* 19* 19*  < > 20* 17*  < > 17*  < > 22*  < > 23* 26*  < > 25* 21* 23* 22*   RBC 2.8* 2.4* 2.4*  < > 2.4* 2.0*  < > 2.1*  < > 2.6*  < > 2.6* 3.0*  < > 2.7* 2.2* 2.5* 2.4*   Platelets 32* 18* 10*  < > 15* 9*  < > 11*  < > 37*  < > 36* 39*  < > 47* 54* 62* 59*   Neut # K/uL 0.4*  --   --   --  0.1* 0.4*  < > 1.1*  < > 2.6  < > 3.2 7.0*  < > 29.0* 32.8* 38.7* 27.1*   Lymph # K/uL 0.2*  --   --   --  0.1* 0.2*  < > 0.1*  < > 0.1*  < > 0.0* 0.2*  < > 1.1* 2.2 4.5* 2.6   Mono # K/uL 0.4  --   --   --  0.0* 0.0*  < > 0.0*  < > 0.0*  < > 0.0* 0.0*  < > 0.3 0.0* 1.4* 0.4   Eos # K/uL 0.0  --   --   --  0.0 0.0  < > 0.0  < > 0.0  < > 0.0 0.0  < > 0.0 0.4 0.0 0.0   Baso # K/uL 0.0  --   --   --  0.0 0.0  < > 0.0  < > 0.0  < > 0.0 0.0  < > 0.0 0.0 0.0 0.0   Seg Neut % 30.9  --   --   --  52.1 71.4  < > 88.0  < > 94.7  < > 94.6 90.2  < > 71.9 56.0 49.3 53.5   Lymphocyte % 17.3  --   --   --  47.9 25.4  < > 11.1  < > 4.4  < > 0.9 1.8  < > 2.6 5.3 7.6 6.1   Monocyte  % 39.5  --   --   --  0.0 1.6  < > 0.0  < > 0.0  < > 0.0 0.0  < > 0.9 0.0 2.5 0.9  Eosinophil % 0.0  --   --   --  0.0 0.0  < > 0.0  < > 0.0  < > 0.0 0.0  < > 0.0 0.9 0.0 0.0   Basophil % 0.0  --   --   --  0.0 0.0  < > 0.0  < > 0.0  < > 0.0 0.0  < > 0.0 0.0 0.0 0.0   Bands % 9  --   --   --   --   --   --  1  --  1  --  1 2  < > 10 18* 20* 8   Myelocyte % 1*  --   --   --   --   --   --   --   --   --   --  2* 2*  < > 9* 4* 2* 12*   Metamyelocyte % 3*  --   --   --   --   --   --   --   --   --   --  2* 4*  < > 4* 13* 17* 12*   Promyelocyte %  --   --   --   --   --   --   --   --   --   --   --   --   --   --  2* 1*  --  4*   Blasts %  --   --   --   --   --   --   --   --   --   --   --   --   --   --   --  2* 2* 3*   < > = values in this interval not displayed.            Lab results: 05/29/15  0028 05/28/15  0018 05/27/15  0020   Sodium 134 135 133   Potassium 4.4 4.3 4.8   Chloride 98 97 97   CO2 _0 UN 23* 15 13   Creatinine 0.82 0.68 0.64   GFR,Caucasian 81 99 101   GFR,Black 93 114 116   Glucose 172* 162* 152*   Calcium 8.3* 8.3* 8.4*           Lab results: 05/29/15  0028   Magnesium 1.4       Lab Results   Component Value Date    ALT 30 05/29/2015    AST 15 05/29/2015     Lab Results   Component Value Date    ALK 67 05/29/2015     Lab Results   Component Value Date    TB 5.0 (H) 05/29/2015     LD   Date Value Ref Range Status   05/27/2015 186 118 - 225 U/L Final     LD, PL   Date Value Ref Range Status   05/09/2015 1146 (H) 118 - 225 U/L Final     LDL Calculated   Date Value Ref Range Status   08/27/2013 113 mg/dL Final     Comment:     REFERENCE RANGE:  < 100 Optimal                  100-129 Near or above optimal                  130-159 Borderline High  160-189 High                    > 189 Very High         I saw and evaluated the patient. I agree with the NP's findings and plan of care as documented above. Carly Higgins is a 55 yo female with unclassifiable myeloproliferative  neoplasm who is day +12 of MAC BUFLU and MUD allogeneic PBSCT. Her post-transplant course has been complicated by oral/anal mucositis, diarrhea, direct hyperbilirubinemia related to biliary process and now hypoxia with CT scan evidence of pneumonia versus fluid overload.      S: Events last night noted. She is feeling a little better today with less mouth/throat pain. She continues to have abdominal pain and cramping, but not localizing. Later in the day, she became hypoxic required increased O2 needs.       O:  Ms. Rossmann was sitting up in her bed in less distress. Oral mucosa with improvement in her mucositis and tongue swelling. Bilateral crackles mainly in the bases. Audible heart sounds with tachycardia, but no murmur or gallops, soft with no tenderness, and no leg edema.       Lab.: reviewed.    CT scan:  The examination is adequate for determining the presence of pulmonary    emboli. No pulmonary emboli.       Opacities throughout the lungs compatible with pneumonia.       Contrast reflux into the hepatic veins suggestive of cardiac    dysfunction. Correlate with echocardiogram.       Small right pleural effusion.         A/P:  Carly Higgins is a 55 yo female with unclassifiable myeloproliferative neoplasm who is day +12 of MAC BUFLU and MUD allogeneic PBSCT. Her post-transplant course has been complicated by oral and anal mucositis, grade III, diarrhea, direct hyperbilirubinemia with US findings of biliary process, and now tachycardia and hypoxia related to bilateral pulmonary opacities, pneumonia vs fluid overload. Blood counts recovering. The plan is diurese with Lasix today. To better asses her I/O, we will have a foley catheter in. Continue broad spectrum antibiotics cefepime and flagyl. We will decrease her IVF rate as she might be getting volume overloaded. Continue actigall. Continue morphine PCA for pain control for mucositis, but consider tapering as pain improves. For tachycardia, we will check  another EKG, continue B-blockers and tele and consider cardiac consult as needed. We continue daily labs to assess for electrolyte replacements, tac adjustments and blood transfusion needs. Continue prophylactic antibiotics, but we will continue to hold fluconazole for now. Continue tac and give methotrexate as planned on days 1,3, 6, for GVHD prophylaxis, however day +11 dose was held given significant hyperbilirubinemia. Encouraged sitting up in chair and trying PO intake as possible.

## 2015-05-30 ENCOUNTER — Encounter: Payer: Self-pay | Admitting: Gastroenterology

## 2015-05-30 LAB — VENOUS GASES / WHOLE BLOOD PANEL
Base Excess,VENOUS: -3 mmol/L — ABNORMAL LOW (ref <=2)
Bicarbonate,VENOUS: 24 mmol/L (ref 21–28)
CO2 (Calc),VENOUS: 26 mmol/L (ref 22–31)
CO: 1 %
FO2 HB,VENOUS: 69 % (ref 63–83)
Glucose,WB: 135 mg/dL — ABNORMAL HIGH (ref 60–99)
Hemoglobin: 8.7 g/dL — ABNORMAL LOW (ref 11.2–15.7)
ICA @7.4,WB: 4.6 mg/dL — ABNORMAL LOW (ref 4.8–5.2)
ICA Uncorr,WB: 5 mg/dL
Lactate VEN,WB: 0.7 mmol/L (ref 0.5–2.2)
Methemoglobin: 0.5 % (ref 0.0–1.0)
NA, WB: 132 mmol/L — ABNORMAL LOW (ref 135–145)
PCO2,VENOUS: 59 mm Hg — ABNORMAL HIGH (ref 40–50)
PH,VENOUS: 7.24 — ABNORMAL LOW (ref 7.32–7.42)
PO2,VENOUS: 44 mm Hg — ABNORMAL HIGH (ref 25–43)
Potassium,WB: 4 mmol/L (ref 3.4–4.7)

## 2015-05-30 LAB — RBC MORPHOLOGY

## 2015-05-30 LAB — CBC AND DIFFERENTIAL
Baso # K/uL: 0 10*3/uL (ref 0.0–0.1)
Basophil %: 0 %
Eos # K/uL: 0 10*3/uL (ref 0.0–0.4)
Eosinophil %: 0 %
Hematocrit: 23 % — ABNORMAL LOW (ref 34–45)
Hemoglobin: 7.8 g/dL — ABNORMAL LOW (ref 11.2–15.7)
Lymph # K/uL: 0.3 10*3/uL — ABNORMAL LOW (ref 1.2–3.7)
Lymphocyte %: 9.6 %
MCH: 27 pg/cell (ref 26–32)
MCHC: 33 g/dL (ref 32–36)
MCV: 82 fL (ref 79–95)
Mono # K/uL: 0.8 10*3/uL (ref 0.2–0.9)
Monocyte %: 27.2 %
Neut # K/uL: 1.7 10*3/uL (ref 1.6–6.1)
Nucl RBC # K/uL: 0.1 10*3/uL — ABNORMAL HIGH (ref 0.0–0.0)
Nucl RBC %: 3.3 /100 WBC — ABNORMAL HIGH (ref 0.0–0.2)
Platelets: 62 10*3/uL — ABNORMAL LOW (ref 160–370)
RBC: 2.9 MIL/uL — ABNORMAL LOW (ref 3.9–5.2)
RDW: 16.4 % — ABNORMAL HIGH (ref 11.7–14.4)
Seg Neut %: 53.6 %
WBC: 3 10*3/uL — ABNORMAL LOW (ref 4.0–10.0)

## 2015-05-30 LAB — BASIC METABOLIC PANEL
Anion Gap: 14 (ref 7–16)
CO2: 20 mmol/L (ref 20–28)
Calcium: 7.8 mg/dL — ABNORMAL LOW (ref 8.6–10.2)
Chloride: 99 mmol/L (ref 96–108)
Creatinine: 0.9 mg/dL (ref 0.51–0.95)
GFR,Black: 83 *
GFR,Caucasian: 72 *
Glucose: 133 mg/dL — ABNORMAL HIGH (ref 60–99)
Lab: 39 mg/dL — ABNORMAL HIGH (ref 6–20)
Potassium: 4.1 mmol/L (ref 3.3–5.1)
Sodium: 133 mmol/L (ref 133–145)

## 2015-05-30 LAB — TYPE AND SCREEN
ABO RH Blood Type: A POS
Antibody Screen: NEGATIVE

## 2015-05-30 LAB — COMPREHENSIVE METABOLIC PANEL
ALT: 31 U/L (ref 0–35)
AST: 16 U/L (ref 0–35)
Albumin: 3.3 g/dL — ABNORMAL LOW (ref 3.5–5.2)
Alk Phos: 69 U/L (ref 35–105)
Anion Gap: 16 (ref 7–16)
Bilirubin,Total: 3.5 mg/dL — ABNORMAL HIGH (ref 0.0–1.2)
CO2: 19 mmol/L — ABNORMAL LOW (ref 20–28)
Calcium: 8.6 mg/dL (ref 8.6–10.2)
Chloride: 98 mmol/L (ref 96–108)
Creatinine: 0.99 mg/dL — ABNORMAL HIGH (ref 0.51–0.95)
GFR,Black: 74 *
GFR,Caucasian: 64 *
Glucose: 165 mg/dL — ABNORMAL HIGH (ref 60–99)
Lab: 33 mg/dL — ABNORMAL HIGH (ref 6–20)
Potassium: 4.2 mmol/L (ref 3.3–5.1)
Sodium: 133 mmol/L (ref 133–145)
Total Protein: 5.5 g/dL — ABNORMAL LOW (ref 6.3–7.7)

## 2015-05-30 LAB — LACTATE DEHYDROGENASE: LD: 203 U/L (ref 118–225)

## 2015-05-30 LAB — DIFF MANUAL
Bands %: 4 % (ref 0–10)
Diff Based On: 114 CELLS
Metamyelocyte %: 4 % — ABNORMAL HIGH (ref 0–1)
Myelocyte %: 3 % — ABNORMAL HIGH (ref 0–0)

## 2015-05-30 LAB — TACROLIMUS LEVEL: Tacrolimus: 10.5 ng/mL

## 2015-05-30 LAB — LACTATE, VENOUS, WHOLE BLOOD: Lactate VEN,WB: 1.8 mmol/L (ref 0.5–2.2)

## 2015-05-30 LAB — PHOSPHORUS: Phosphorus: 3.5 mg/dL (ref 2.7–4.5)

## 2015-05-30 LAB — MAGNESIUM: Magnesium: 1.7 mEq/L (ref 1.3–2.1)

## 2015-05-30 LAB — AEROBIC CULTURE: Aerobic Culture: 0

## 2015-05-30 MED ORDER — IOHEXOL 350 MG/ML (OMNIPAQUE) IV SOLN *I*
1.0000 mL | Freq: Once | INTRAVENOUS | Status: AC
Start: 2015-05-30 — End: 2015-05-30
  Administered 2015-05-30: 150 mL via INTRAVENOUS

## 2015-05-30 MED ORDER — NALOXONE HCL 0.4 MG/ML IJ SOLN *WRAPPED*
0.1000 mg | Status: DC | PRN
Start: 2015-05-30 — End: 2015-06-02
  Filled 2015-05-30: qty 1

## 2015-05-30 MED ORDER — AZITHROMYCIN 500MG IN 280ML D5W *I*
500.0000 mg | INTRAVENOUS | Status: AC
Start: 2015-05-30 — End: 2015-06-01
  Administered 2015-05-30 – 2015-06-01 (×3): 500 mg via INTRAVENOUS
  Filled 2015-05-30 (×3): qty 250

## 2015-05-30 MED ORDER — STERILE WATER FOR IRRIGATION IR SOLN *I*
900.0000 mL | Freq: Once | Status: AC
Start: 2015-05-30 — End: 2015-05-30
  Administered 2015-05-30: 900 mL via ORAL

## 2015-05-30 MED ORDER — METOPROLOL TARTRATE 12.5 MG PO CAPS *I*
12.5000 mg | ORAL_CAPSULE | Freq: Once | ORAL | Status: AC
Start: 2015-05-30 — End: 2015-05-30
  Administered 2015-05-30: 12.5 mg via ORAL
  Filled 2015-05-30: qty 1

## 2015-05-30 MED ORDER — HYDROMORPHONE PCA 1 MG/ML *WRAPPED*
INTRAMUSCULAR | Status: DC
Start: 2015-05-30 — End: 2015-05-31
  Filled 2015-05-30: qty 25

## 2015-05-30 MED ORDER — METOPROLOL TARTRATE 25 MG PO TABS *I*
25.0000 mg | ORAL_TABLET | Freq: Four times a day (QID) | ORAL | Status: DC
Start: 2015-05-30 — End: 2015-06-01
  Administered 2015-05-30 – 2015-06-01 (×7): 25 mg via ORAL
  Filled 2015-05-30 (×8): qty 1

## 2015-05-30 MED ORDER — ENOXAPARIN SODIUM 40 MG/0.4ML IJ SOSY *I*
40.0000 mg | PREFILLED_SYRINGE | Freq: Every day | INTRAMUSCULAR | Status: DC
Start: 2015-05-30 — End: 2015-06-07
  Administered 2015-05-30 – 2015-06-06 (×8): 40 mg via SUBCUTANEOUS
  Filled 2015-05-30 (×9): qty 0.4

## 2015-05-30 MED ORDER — DILTIAZEM HCL 5 MG/ML IV SOLN *WRAPPED*
20.0000 mg | Freq: Once | INTRAVENOUS | Status: AC
Start: 2015-05-30 — End: 2015-05-30
  Administered 2015-05-30: 20 mg via INTRAVENOUS
  Filled 2015-05-30: qty 5

## 2015-05-30 MED ORDER — DILTIAZEM HCL 5 MG/ML IV SOLN *WRAPPED*
10.0000 mg/h | INTRAVENOUS | Status: AC
Start: 2015-05-30 — End: 2015-05-31
  Administered 2015-05-30 – 2015-05-31 (×3): 10 mg/h via INTRAVENOUS
  Filled 2015-05-30 (×3): qty 20

## 2015-05-30 NOTE — Plan of Care (Signed)
Comfort - BMT-related     Nausea controlled or eliminated Maintaining     Diarrhea controlled or eliminated Maintaining     Without infection Maintaining        Graft Versus Host Disease (GVHD) Bundle     Without rash Maintaining     Stool output within parameters (</= 575ml in 24 h) Maintaining     Total bilirubin </= 2 Maintaining        Psychosocial     Demonstrates ability to cope with illness Maintaining        Safety     Patient will remain free of falls Maintaining          Mobility     Patient's functional status is maintained or improved Progressing towards goal        Nutrition     Patient's nutritional status is maintained or improved Progressing towards goal        Pain/Comfort     Patient's pain or discomfort is manageable Progressing towards goal        Pt tachy into 160s, discontinued morphine PCA due to ineffective pain management and began dilaudid PCA.

## 2015-05-30 NOTE — Consults (Signed)
Medical Nutrition Therapy - Follow-up    Admit Date: 05/11/2015    Reason for consult: eval and treat    Patient Summary:   55 year old woman with unclassifiable myeloproliferative neoplasm causing leukocytosis with some myeloid immaturity. By [employer] testing, she has mutations of TET2, ASXL1, CCND2, GATA2, SRSF2.  Modie is now admitted for an unrelated donor transplant PBSCT with Day 0 = 05/17/15 after conditioning with busulfan/fludarabine.    BMT course:  5/12 multiple episodes of vomiting, abd discomfort  5/14 diarrhea, rectal pain  5/15 still with some nausea and diarrhea  5/17 significant mucositis  5/18 nadir  5/19 mucositis worse, tongue very swollen; TB 6.1, change diflucan to caspo; US abdomen showed GB sludge/distension  5/20 still with persistent nausea; tachycardic  5/21 ANC 1100, pt c/o SOB - CT angio done - no PE;  Echo - unrevealing  5/22 ANC 3500, TB down to 3.5; pt c/o abd pain; to have cardiology consult for tachycardia; CT abd        Past Medical History:   Diagnosis Date    GERD (gastroesophageal reflux disease)     Shortness of breath      Past Surgical History:   Procedure Laterality Date    ROTATOR CUFF REPAIR Right         Pertinent Social Hx: lives locally, divorced.    Pertinent Meds: reviewed, SS insulin, tacro gtt  .  Pertinent Labs: reviewed,   WBC 3.0, ANC 1.7, plt 62  TB 3.5   electrolytes, glucose, creatinine, transaminases all within normal or acceptable levels for current medical status    Reviewed I/O's   Oral intake: <500 ml/d for the past 5 days  Emesis - none today or yesterday, 1-2x/d 5/17,5/18,5/19  Stool:  50 ml plus unmeasured x1 yesterday;  None for the 5 days before    Access: DL IVAD    Nutrition Hx: at admission, reported that she had some days during chemo when she didn't feel like eating much, but says "not too many".    Food allergies: NKFA    Current diet: regular  Supplements: none    Nutrition Focused Physical Exam:  Edema: none  Abdomen: distended,  bloating  Oral score:  16  Skin: jaundiced  Body fat stores: deferred  Lean body mass stores: deferred    Anthropometrics:  Height: 167.6 cm (5\' 6" )    Current Weight: 122.4 kg (269 lb 13.5 oz);   Admit weight:  118.6 kg  UBW: 113-118 kg  Ideal Body Weight: 60.2 kg + 10%  Body mass index is 43.55 kg/(m^2). using current weight;  Obese grade 2    Weight Hx:   Stable in the 113-118 kg range over the past several years    Estimated Nutrient Needs: (Based on 60.2 kg)    1800-2100 kcal/day (30-35 kcal/kg)   90-120 g protein/day (1.5-2.0 g/kg)    2100 mL fluid/day (35 mL/kg)      Nutrition Assessment and Diagnosis:     Patient had been ~6 kg below admit weight last week, now ~4 kg above it.    Thinks abd pain was gas.    Still with significant mucositis, trying not to talk.   Was able to take an entire Ensure today.    Malnutrition Status:   Does not currently fit criteria for either moderate or severe protein calorie malnutrition    Nutrition Intervention:   1. continue regular diet, encourage intake  2. Will re-start Ensure Enlive as patient  able to take some today  3. Will discuss on round tomorrow whether patient it would be appropriate to start parenteral nutrition at this point.    Nutrition Monitoring/Evaluation:   1. Will monitor diet tolerance and intake, nutrition-related labs, weight trend, BM pattern.    2. Nutrition to follow up per high nutrition risk protocol.    Rose Fillers, [place], [place], [place] pager [phone]

## 2015-05-30 NOTE — Progress Notes (Signed)
Cardiology Consult Note    Date of Consult: 05/30/2015    Name: Carly Higgins Attending: Assunta Curtis, MD   DOB: [birth year] PCP: Benedict Needy, MD   MRN: [id number] Primary Cardiologist: None   CSN: [id number] Admit Date:  05/11/2015     History of Present Illness     I had the pleasure of seeing Carly Higgins in cardiology consult on 05/30/2015. Carly Higgins is an 55 y.o. female who we were asked to see for tachycardia. She is post PBSCT and developed acute onset tachycardia Friday and has been feeling palpitations since.    Past Medical and Surgical History     Past Medical History:   Diagnosis Date    GERD (gastroesophageal reflux disease)     Shortness of breath      Past Surgical History:   Procedure Laterality Date    ROTATOR CUFF REPAIR Right        Medications and Allergies     Prescriptions Prior to Admission   Medication Sig    Meds & Orders Exist in Blood Admin Plan     traMADol (ULTRAM) 50 MG tablet     naproxen (NAPROSYN) 500 MG tablet Take 500 mg by mouth 2 times daily (with meals)    hydroxyurea (HYDREA) 500 MG capsule Take 1 capsule (500 mg total) by mouth daily    cyclobenzaprine (FLEXERIL) 10 MG tablet Take 10 mg by mouth 3 times daily as needed for Muscle spasms    prochlorperazine (COMPAZINE) 10 MG tablet Take 1 tablet (10 mg total) by mouth 4 times daily as needed for Nausea    escitalopram (LEXAPRO) 10 MG tablet Take 15 mg by mouth daily    allopurinol (ZYLOPRIM) 300 MG tablet Take 1 tablet (300 mg total) by mouth daily     Current Facility-Administered Medications   Medication Dose Route Frequency    azithromycin  500 mg Intravenous Q24H    metoprolol  25 mg Oral Q6H    iohexol (OMNIPAQUE) 300 mg/mL oral solution 54mL/870mL Water  900 mL Oral Once    diltiazem  20 mg Intravenous Once    caspofungin (CANCIDAS) IVPB  50 mg Intravenous Q24H    ipratropium-albuterol  3 mL Nebulization 4x Daily    simethicone  80 mg Oral 4x Daily    ceFEPime (MAXIPIME) IV  2 g Intravenous Q8H     docusate sodium  200 mg Oral 2 times per day    senna  1 tablet Oral 2 times per day    ursodiol  300 mg Oral TID    metroNIDAZOLE  500 mg Intravenous Q8H    acyclovir  200 mg Intravenous Q12H    pantoprazole  40 mg Intravenous Q12H    ondansetron  4 mg Oral Q6H    Or    ondansetron  4 mg Intravenous Q6H    nicotine  1 patch Transdermal Daily    magnesium sulfate  2,000 mg Intravenous Daily    potassium chloride  20 mEq Intravenous Daily    calcium gluconate  4.7 mEq Intravenous Daily    escitalopram  15 mg Oral Daily     She has No Known Allergies (drug, envir, food or latex).    Social and Family History     Family History   Problem Relation Age of Onset    No Known Problems Mother     No Known Problems Father      Social History     Social History  Marital status: Divorced     Spouse name: N/A    Number of children: N/A    Years of education: N/A     Occupational History    Not on file.     Social History Main Topics    Smoking status: Current Every Day Smoker     Packs/day: 0.50     Years: 30.00    Smokeless tobacco: Not on file    Alcohol use Yes      Comment: occassional    Drug use: No    Sexual activity: Not on file     Social History Narrative         Review of Systems     Review of Systems   Cardiovascular: Positive for palpitations. Negative for chest pain and orthopnea.   Musculoskeletal: Negative for falls.   Neurological: Negative for loss of consciousness.     Vitals and Physical Exam     Carly Higgins's  height is 1.676 m (5\' 6" ) and weight is 122.4 kg (269 lb 13.5 oz). Her temporal temperature is 36.9 C (98.4 F). Her blood pressure is 127/79 and her pulse is 161 (abnormal). Her respiration is 23 and oxygen saturation is 92%.  Body mass index is 43.55 kg/(m^2).  I/O last 3 completed shifts:  05/21 0700 - 05/22 0659  In: 3241 (26.5 mL/kg) [P.O.:670; I.V.:1325 (0.5 mL/kg/hr); IV Piggyback:1246]  Out: 1200 (9.8 mL/kg) [Urine:1200 (0.4 mL/kg/hr)]  Net: 2041  Weight: 122.5 kg   Physical  Exam   General: Pleasant. Alert/interactive. Uncomfortable.  HEENT: MMM  Cor: regular tachycardia  Pulm: Normal WOB, CTA-B  Ext: WWP, 2+ peripheral pulses  Neuro: A&O to conversation      Laboratory Data     Hematology:   Results in Past 365 Days  Result Component Current Result Previous Result   WBC 3.0 (L) (05/30/2015) 1.1 (L) (05/29/2015)   Hemoglobin 7.8 (L) (05/30/2015) 7.9 (L) (05/29/2015)   Hematocrit 23 (L) (05/30/2015) 22 (L) (05/29/2015)   Platelets 62 (L) (05/30/2015) 32 (L) (05/29/2015)     Chemistry:   Results in Past 365 Days  Result Component Current Result Previous Result   Sodium 133 (05/30/2015) 134 (05/29/2015)   Potassium 4.2 (05/30/2015) 4.4 (05/29/2015)   Creatinine 0.99 (H) (05/30/2015) 0.82 (05/29/2015)   Glucose 165 (H) (05/30/2015) 172 (H) (05/29/2015)   Calcium 8.6 (05/30/2015) 8.3 (L) (05/29/2015)   AST 16 (05/30/2015) 15 (05/29/2015)   ALT 31 (05/30/2015) 30 (05/29/2015)   TSH 3.63 (12/31/2014) Not in Time Range     Coagulation Studies:   Results in Past 365 Days  Result Component Current Result Previous Result   aPTT 26.5 (05/11/2015) 23.8 (L) (05/03/2015)   INR 1.3 (H) (05/27/2015) 1.1 (05/11/2015)     Cardiac:   No results found for requested labs within last 365 days.     Lipids:   Results in Past 365 Days  Result Component Current Result Previous Result   Triglycerides 156 (!) (05/26/2015) 347 (!) (05/11/2015)     Cardiac/Imaging Data     ECG/Telemetry: regular tachycardia at 170s         Echo Complete 05/29/2015    Narrative  Tachycardia NOS.   Mildly hypertrophied LV with left atrial enlargement.    Normal calculated resting LVEF (60%) with reduced stroke volume but no   significant wall motion abnormalities.    No significant valvular abnormalities.    Normal right heart size and function with estimated normal pulmonary   artery pressures.  Impression and Plan     Active Problems:    Myeloproliferative disease      In my clinical evaluation/judgment this patient does not have an  AMI.      This is an 55 y.o. female with atrial flutter s/p PBSCT. She is quite symptomatic and states her palpitations started abruptly Friday and this given relatively fixed HR indicates likely atrial flutter. Unfortunately, given her low blood counts would be a good candidate for anticoagulation and cardioversion.    - Give 20 mg IV diltiazem x1 and start gtt at 10 mg/h, Hold for SBP <90 mmHg  - If rate remains uncontrolled after 4h, increase gtt to 15 mg/h  - When rate control obtained, start diltiazem SR at dose equivalent to daily gtt dose  - Continue current dose of metoprolol    Vickey Huger, MD  Electronically signed on 05/30/2015 at 2:48 PM.

## 2015-05-30 NOTE — Progress Notes (Signed)
Patient received diltiazem loading bolus of 20mg  and started on drip of 10mg /hr.  Tolerating well and responding.  HR 160's before administration.  Currently HR 120's-130's.  Intermittent AFIB according to telemetry.  Team aware.  No EKG ordered.    ABD CT results pending.    Tolerating PCA.

## 2015-05-30 NOTE — Progress Notes (Addendum)
[facility] Transplant Program APP Progress Note    CC   Carly Higgins is a 55 y.o. 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 By [employer] testing, she has mutations of TET2, ASXL1, CCND2, GATA2, SRSF2.     Admitted for MUD allogeneic PBSC transplant.     Length of stay  LOS: 19 days     Interval History   Patient continues with tachycardia with HR in 160s this morning.  Feeling short of breath with audible wheezing, increased WOB.  O2 sat 100% on 1L O2 via NC.  Afebrile, continue cefepime/Flagyl and add azithromycin for atypical coverage given pneumonia on CTA yesterday.  Increased abdominal distention today, markedly increased from prior exam, as well as increased abdominal pain; obtaining KUB to rule out free air followed by CT A/P with contrast.   T bili continues to trend down.  Switched morphine PCA to Dilaudid for improved pain control, and to avoid any potential sphincter of Oddi spasm.  Foley removed overnight per patient's request, with PVRs negative for urinary retention.  Cardiology consulted regarding continued tachycardia, suspect atrial flutter; per recommendations, diltiazem push followed by initiating diltiazem drip, and increase metoprolol dosage.  Also per Cardiology consider increasing drip rate this evening if no improvement in HR.      Review of Systems   A comprehensive review of 13 systems was performed; pertinent positives checked below:  _0  fevers  _1  fast pulse _2  fatigue   _3  chills _4  chest pain _5  dysuria    _6  dry eyes _7  nausea/emesis  _8  rash   _9  ear pain _10  abdominal cramping _11  skin changes   _12  sores in mouth _13  abdominal discomfort _14  pain   _15  sore throat _16  loose stool, non-formed _17  none of the above   _18  cough  _19  diarrhea, watery    _20  sputum production  _21  weight loss    _22  shortness of breath _23  insufficient oral intake      Vital Signs and I&O   Temp:  [35.7 C (96.2 F)-36.9 C (98.4 F)]  36.2 C (97.2 F)  Heart Rate:  [160-164] 161  Resp:  [20-28] 24  BP: (107-140)/(73-96) 127/93  FiO2:  [1.5 %-31 %] 1.5 %    Intake/Output Summary (Last 24 hours) at 05/30/15 1529  Last data filed at 05/30/15 1459   Gross per 24 hour   Intake          2255.77 ml   Output             1125 ml   Net          1130.77 ml        Physical Exam     Head:  Normocephalic, atraumatic   Eyes:  Conjunctiva/corneas clear, icterus noted    Throat: Oral mucosa pink & moist; healing lesions    Lungs:   Bilateral wheezing diffusely, tachypneic, increased WOB   Heart:  Tachycardic, regular rhythm, no murmur/gallop/rub   Abdomen:   Markedly distended, diffusely tender to deep palpation, BS present   Extremities: No edema   Pulses: 2+ and symmetric   Skin: No rashes or lesions, jaundice noted   Neurologic: A&O X 3, CMS intact   Central venous access: IVAD - C/D/I      Assessment & Plan   Carly Higgins is a 55 yo female with unclassifiable myeloproliferative neoplasm.  By [employer] testing, she has mutations of TET2, ASXL1, CCND2, GATA2, SRSF2.  Admitted for MUD allogeneic PBSCT.     Atypical MPN  Completed Decitabine 19m/m2 IV daily x 5 days - Cycle # 1 12/15/14  On intermittent/dose adjusted hydroxyurea at admission - discontinued 5/5 in setting of falling WBC     Day -5 thru -2 (5/4-5/7):  Flu 424mm2, Bu 13039m2 - Busulfan dose adjusted to 285m10mr last 2 doses (from 244mg47may -1 (5/8): Rest day  Day 0 (5/9): MUD (10/10 HLA, female) allo PBSCT, ABO: A+/A+, CMV: neg/neg     Today is BMT day +12    Pancytopenia  Transfuse 1 unit RBCs for hematocrit <21% and/or transfuse 1 unit platelets for platelet count <11,000 OR transfuse 1 unit platelets for platelet count <20,000 if bleeding or fever.  Helmet for safety during periods of thrombocytopenia, per hospital policy    DVT ppx  Enoxaparin - on hold for plts <30K     ID  Cholecystitis on abd US 5/[place]  T bili 6.1, trending down now   Cipro/Flagyl started - Cipro stopped 5/20 (see  below)    PNA on CTA   Tachycardic 5/20 - Cipro stopped, vancomycin 2g x 1 dose, started cefepime, cont Flagyl; CXR pulm edema vs infection   Hypoxic 5/21 - negative for PE, likely PNA    Azithromycin added 5/22 for atypical coverage   Cont supplemental O2, wean as tolerated     Viral prophylaxis with acyclovir  Fungal prophylaxis with fluconazole    CV/pulm  Albuterol, Duonebs ATC wheezing   5/20: sudden tachycardia to 160s, no response to 500cc bolus; CXR with pulm edema vs infection  5/21: hypoxic, still tachy; CTA negative for PE, dye reflux into liver vasculature, echo WNL; metoprolol 12.5mg Q38mstarted with no effect     Continued tachycardia today - Cardiology consulted, diltiazem 20mg x27mush followed by 10mg/hr13ms, increased metoprolol to 25mg Q6H27m no improvement in HR by this evening, consider increasing gtts to 15mg/hr p48mheir recs     Pulm  Hypoxic 5/21, CTA negative for PE but shows likely PNA, MIVF stopped, Lasix x 1 40mg given19mh good effect  Weaning O2 as tolerated     Endo  Hyperglycemia resolved - discontinue SSI    GVHD  Planned GVHD prophylaxis with tacrolimus and methotrexate  Tacro by continuous IV infusion beginning on day-1 via PIV  Target tacrolimus level 8-12    Methotrexate will dose equivalent leucovorin will be adminstered as follows:   15 mg/m2 on day +1 (>24 hrs from stem cell infusion) (full dose given)  10 mg/m2 on days +3, +6, and +11     Day 11 MTX held due to T biliof 5.4, mucositis     Renal  Creatinine at admission was 0.7, stable  Encouraged PO intake   Foley placed 5/21, removed overnight  PVR negative      Electrolyte imbalance  Replace electrolytes as to the following parameters  Hypokalemia, supplement when serum K <3.6  Hypomagnesemia, supplement when serum Mag <1.5   Hypocalcemia, supplement when corrected calcium <8.9     At risk for malnutrition/hypoalbuminemia  Regular diet  Albumin level on admission 4.3  Nutrition following     GI  ATC Zofran  PRN  Phenergan simethicone   Protonix BID, PRN Tums, carafate for GERD   PRN Tucks, Anusol for rectal/anal pain  Colace and senna PRN constipation     VOD/SOS  Moderate risk with ablative BU/Flu  Ursodiol ppx  5/19: T bili 6.1,  jaundice, nausea; abdominal Doppler US cw cholecystitis, started cipro/flagyl  5/20: T bili 5.4, stop cipro, start cefepime  5/21: T bili 5.0  5/22: T bili 3.5, increased abd distention/pain; obtain KUB to R/O free air followed by CT A/P with contrast     Mucositis  WHO Grade 3 - improving   Dilaudid PCA switched to morphine 5/19 with worsened pain control - switched back to Dilaudid 5/22 in setting increased abd pain  Cont good oral care, normal saline rinses  PRN BMX    GU  Hematuria x 1, no recurrence  NGTD on urine cx   Foley placed 5/21, removed overnight  PVR negative     At risk for deconditioning   Encouraged physical activity, ambulation  Consult PT if needed    Psychosocial  Cont emotional support     Smoking Cessation  Offered nicotine replacement & [place] [facility]; patient declining at this time    Dispo planning  Anticipate discharge following engraftment and clinical stability  BMT consent signed  BMT attending [name]  Primary care physician [name]  RN coordinator Anne/Sharon  Lives in [place]    Patient seen and discussed with Dr. Samuel Germany, PA    Lab Data      Lab results: 05/30/15  4497 05/29/15  0028 05/28/15  0018  05/25/15  5300  05/22/15  5110  05/17/15  0345  05/14/15  0106 05/13/15  0107 05/12/15  0527 05/11/15  1649   WBC 3.0* 1.1* 0.1*  < > 0.2*  < > 1.2*  < > 3.3*  < > 35.4* 44.3* 56.1* 43.7*   Hemoglobin 7.8* 7.9* 6.9*  < > 6.9*  < > 6.0*  < > 7.6*  < > 7.9* 6.5* 7.2* 7.1*   Hematocrit 23* 22* 19*  < > 20*  < > 17*  < > 23*  < > 25* 21* 23* 22*   RBC 2.9* 2.8* 2.4*  < > 2.4*  < > 2.1*  < > 2.6*  < > 2.7* 2.2* 2.5* 2.4*   Platelets 62* 32* 18*  < > 15*  < > 11*  < > 36*  < > 47* 54* 62* 59*   Neut # K/uL 1.7 0.4*   --   --  0.1*  < > 1.1*  < > 3.2  < > 29.0* 32.8* 38.7* 27.1*   Lymph # K/uL 0.3* 0.2*  --   --  0.1*  < > 0.1*  < > 0.0*  < > 1.1* 2.2 4.5* 2.6   Mono # K/uL 0.8 0.4  --   --  0.0*  < > 0.0*  < > 0.0*  < > 0.3 0.0* 1.4* 0.4   Eos # K/uL 0.0 0.0  --   --  0.0  < > 0.0  < > 0.0  < > 0.0 0.4 0.0 0.0   Baso # K/uL 0.0 0.0  --   --  0.0  < > 0.0  < > 0.0  < > 0.0 0.0 0.0 0.0   Seg Neut % 53.6 30.9  --   --  52.1  < > 88.0  < > 94.6  < > 71.9 56.0 49.3 53.5   Lymphocyte % 9.6 17.3  --   --  47.9  < > 11.1  < > 0.9  < > 2.6 5.3 7.6 6.1   Monocyte % 27.2 39.5  --   --  0.0  < >  0.0  < > 0.0  < > 0.9 0.0 2.5 0.9   Eosinophil % 0.0 0.0  --   --  0.0  < > 0.0  < > 0.0  < > 0.0 0.9 0.0 0.0   Basophil % 0.0 0.0  --   --  0.0  < > 0.0  < > 0.0  < > 0.0 0.0 0.0 0.0   Bands % 4 9  --   --   --   --  1  < > 1  < > 10 18* 20* 8   Myelocyte % 3* 1*  --   --   --   --   --   --  2*  < > 9* 4* 2* 12*   Metamyelocyte % 4* 3*  --   --   --   --   --   --  2*  < > 4* 13* 17* 12*   Promyelocyte %  --   --   --   --   --   --   --   --   --   --  2* 1*  --  4*   Blasts %  --   --   --   --   --   --   --   --   --   --   --  2* 2* 3*   < > = values in this interval not displayed.            Lab results: 05/30/15  0050 05/29/15  0028 05/28/15  0018   Sodium 133 134 135   Potassium 4.2 4.4 4.3   Chloride 98 98 97   CO2 19* 21 22   UN 33* 23* 15   Creatinine 0.99* 0.82 0.68   GFR,Caucasian 64 81 99   GFR,Black 74 93 114   Glucose 165* 172* 162*   Calcium 8.6 8.3* 8.3*           Lab results: 05/30/15  0050   Magnesium 1.7       Lab Results   Component Value Date    ALT 31 05/30/2015    AST 16 05/30/2015     Lab Results   Component Value Date    ALK 69 05/30/2015     Lab Results   Component Value Date    TB 3.5 (H) 05/30/2015     LD   Date Value Ref Range Status   05/30/2015 203 118 - 225 U/L Final     LD, PL   Date Value Ref Range Status   05/09/2015 1146 (H) 118 - 225 U/L Final     LDL Calculated   Date Value Ref Range Status   08/27/2013 113  mg/dL Final     Comment:     REFERENCE RANGE:  < 100 Optimal                  100-129 Near or above optimal                  130-159 Borderline High                  160-189 High                    > 189 Very High

## 2015-05-30 NOTE — Interdisciplinary Rounds (Addendum)
Interdisciplinary Rounds Note    Date: 05/30/2015   Time: 7:25 AM   Attendance:  Attending, PA, Charge RN    Admit Date/Time:  05/11/2015  2:02 PM    Principal Problem: <principal problem not specified>  Problem List:   Patient Active Problem List    Diagnosis Date Noted    Myeloproliferative disease 05/11/2015    MPN (myeloproliferative neoplasm) 01/19/2014    Leukocytosis 09/22/2013       The patient's problem list and interdisciplinary care plan was reviewed.    Discharge Planning                 *Does patient currently have home care services?: No     *Current External Services: None                      Plan    05/14/15 Day -3.  Will receive Flu/Bu tonight.  Pt tolerating chemo well.  Continue supportive care.   5/7: doing well, continue supportive care  5/8:  Day -1.  Mild nausea, fatigue.  Continue supportive care.  05/17/2015 Day 0, WBC 3.3, ANC 3.2, Hct 23, plts 36, will receive MUD PBSCs today, continue supportive care.  05/18/2015 Day +1 WBC 3.9, ANC 3.7, hct 21, plts 52K, received MUD PBSCs yesterday, tolerated well, MTX given per protocol, continue supportive care.  05/19/15:  Day +2.  Counts dropping with ANC 2600 today.  Glucose control with BG's and SS insulin coverage d/t steroids.  Continue supportive care.  05/20/15 D +3  Labs stable/falling. Multiple episodes of vomiting yesterday, abd discomfort.   Continue supportive care.  5/13: Day +4. BMX for mucositis. Dizzy- 500 ml bolus and add daily prn bolus. Helmet/assist out of bed.  05/22/15: Day +5 today, c/o rectal pain after having diarrhea, given topicals to alleviate burning discomfort, feeling better today. Continue supportive care.  05/23/15:  Day +6.  Mild complaints of nausea and diarrhea.  Check stool for C-Diff and if negative will start imodium.  Complaining of rectal pain - adjust medications to control discomfort.  Due fpr MTX today.  ANC 700,  Plt 7 K and hct 19 - transfused with both products today. Continue supportive care.  05/24/2015 Day  +7 WBC 0.6, ANC 0.4, hct 17, plts 9K, transfused with plts, will receive PRBCs, complaining of mucusitis pain, not eating, encouraged pt to take pain medication, also needs encouragement to get OOB, otherwise continue supportive care.  05/25/2015 Day +8 WBC 0.2, ANC 0.1, hct 20, plts 15K, complaining of increased pain for which the dilaudid helps but does not last per pt, starting PCA, continue other supportive measures.  05/26/15:  Day +9, nadired.  Receiving blood and plts today.  Will stop BG's and SS insulin coverage.  Encourage patient increase activity.  Continue supportive care.  05/27/15: Day +10.  Nadired, plts 10K and hct 19.  T.bili today 6.1.  Ursodiol increased to tid.  U/S of liver today.  Check PT/PTT.  Mucositis worse, tongue very swollen.  Continue IVF to hydrate.  Change diflucan to caspofungin as may contribute to increasing T.bili further.  Change dilaudid PCA to Morphine d/t persistent nausea. Continue supportive care.  05/28/15: Day +11 T-bili still increased at 5.4. No MTX today. Tachy to 150s this AM, bolus given. Still having persistent nausea, cont with antiemetics. Continue supportive care.  05/29/15: Day +12. HR still in the 150s, ANC 1100 today. CT angio done last night and ECHO to be done today. Decreasing  fluids to 55ml/hr. Pt states feeling better this morning. D/C scopolamine patch d/t urinary retention. Continue supportive care.   05/30/15:  Day +13.  Engrafting, WBC 3.0, ANC 1700.  T/bili down to 3.5.  Pt complains of SOB, increased pain in abdomen and throat.  HR continues to be sustained and elevated despite metoprolol.  Cardiology consult today, increase metoprolol to 25 mg q 6 hours with parameters.  Check KUB/CT abdomen.  Add empiric azithromycin. Change Morphine PCA to dilaudid for better pain control.  Had foley in over weekend and was d/c'ed at pt request - will recheck post void residual.  Continue supportive care.    Anticipated Discharge Date:  Discharge Disposition: Home,  choiced to UR

## 2015-05-30 NOTE — Progress Notes (Signed)
Report Given    Message left for [facility] on Wcc[facility] to call back for handoff for CT at [facility]

## 2015-05-30 NOTE — Progress Notes (Signed)
Antimicrobial Stewardship Note    The Antibiotic Approval program was contacted for approval of a restricted anti-infective.  Upon review of microbiological data and other considerations such as site of infection, antimicrobial efficacy, drug costs and dosing characteristics,  Caspofungin therapy has been approved for up to a total of 7 days through 06/06/15 for the following indication: fungal prophylaxis s/p allogeneic SCT, elevated bilirubin. Will change back to azole once hepatic function improves.    The Antibiotic Approval process does not encompass examination of the patient, nor is it intended to suggest clinical diagnoses or treatment plans, but  rather to provide assistance in optimizing antimicrobial therapy once a provider team has considered or established a diagnosis and initiated therapy.  Any recommendations for selection or dosing of specific antibiotics  should be considered in conjunction with all other patient factors.  Antibiotic Approval is NOT a substitute for Infectious Diseases consultation.    Caroleen Hamman, PharmD

## 2015-05-30 NOTE — Progress Notes (Signed)
Assumed care of pt at 1900. Pt tachypneic to 28, HR 160s in sinus tachycardia. C/o pain in abdomen and throat. Pt currently on 1.5 L NC. Xopenex neb given x1 for wheezing with positive effect. Foley removed per order d/t patient agitation. Will continue to closely monitor.

## 2015-05-30 NOTE — Progress Notes (Signed)
Report Given   Lana Fish, RN received handoff report from Broadus John RN on [facility] unit for CT scan 1:44 PM.

## 2015-05-31 LAB — VENOUS GASES / WHOLE BLOOD PANEL
Base Excess,VENOUS: -4 mmol/L — ABNORMAL LOW (ref <=2)
Bicarbonate,VENOUS: 23 mmol/L (ref 21–28)
CO2 (Calc),VENOUS: 25 mmol/L (ref 22–31)
CO: 0.9 %
FO2 HB,VENOUS: 56 % — ABNORMAL LOW (ref 63–83)
Glucose,WB: 143 mg/dL — ABNORMAL HIGH (ref 60–99)
Hemoglobin: 8.8 g/dL — ABNORMAL LOW (ref 11.2–15.7)
ICA @7.4,WB: 4.6 mg/dL — ABNORMAL LOW (ref 4.8–5.2)
ICA Uncorr,WB: 4.9 mg/dL
Lactate VEN,WB: 0.7 mmol/L (ref 0.5–2.2)
Methemoglobin: 0.6 % (ref 0.0–1.0)
NA, WB: 131 mmol/L — ABNORMAL LOW (ref 135–145)
PCO2,VENOUS: 58 mm Hg — ABNORMAL HIGH (ref 40–50)
PH,VENOUS: 7.22 — ABNORMAL LOW (ref 7.32–7.42)
PO2,VENOUS: 35 mm Hg (ref 25–43)
Potassium,WB: 4 mmol/L (ref 3.4–4.7)

## 2015-05-31 LAB — CBC AND DIFFERENTIAL
Baso # K/uL: 0.1 10*3/uL (ref 0.0–0.1)
Basophil %: 0.9 %
Eos # K/uL: 0 10*3/uL (ref 0.0–0.4)
Eosinophil %: 0 %
Hematocrit: 25 % — ABNORMAL LOW (ref 34–45)
Hemoglobin: 8.3 g/dL — ABNORMAL LOW (ref 11.2–15.7)
Lymph # K/uL: 0.6 10*3/uL — ABNORMAL LOW (ref 1.2–3.7)
Lymphocyte %: 10.5 %
MCH: 28 pg/cell (ref 26–32)
MCHC: 34 g/dL (ref 32–36)
MCV: 84 fL (ref 79–95)
Mono # K/uL: 1.4 10*3/uL — ABNORMAL HIGH (ref 0.2–0.9)
Monocyte %: 25.4 %
Neut # K/uL: 2.8 10*3/uL (ref 1.6–6.1)
Nucl RBC # K/uL: 0.3 10*3/uL — ABNORMAL HIGH (ref 0.0–0.0)
Nucl RBC %: 5.6 /100 WBC — ABNORMAL HIGH (ref 0.0–0.2)
Platelets: 107 10*3/uL — ABNORMAL LOW (ref 160–370)
RBC: 3 MIL/uL — ABNORMAL LOW (ref 3.9–5.2)
RDW: 16.7 % — ABNORMAL HIGH (ref 11.7–14.4)
Seg Neut %: 50 %
WBC: 5.4 10*3/uL (ref 4.0–10.0)

## 2015-05-31 LAB — COMPREHENSIVE METABOLIC PANEL
ALT: 34 U/L (ref 0–35)
AST: 19 U/L (ref 0–35)
Albumin: 3.3 g/dL — ABNORMAL LOW (ref 3.5–5.2)
Alk Phos: 68 U/L (ref 35–105)
Anion Gap: 13 (ref 7–16)
Bilirubin,Total: 2.3 mg/dL — ABNORMAL HIGH (ref 0.0–1.2)
CO2: 20 mmol/L (ref 20–28)
Calcium: 8.6 mg/dL (ref 8.6–10.2)
Chloride: 101 mmol/L (ref 96–108)
Creatinine: 0.88 mg/dL (ref 0.51–0.95)
GFR,Black: 86 *
GFR,Caucasian: 74 *
Glucose: 150 mg/dL — ABNORMAL HIGH (ref 60–99)
Lab: 36 mg/dL — ABNORMAL HIGH (ref 6–20)
Potassium: 4.2 mmol/L (ref 3.3–5.1)
Sodium: 134 mmol/L (ref 133–145)
Total Protein: 5.5 g/dL — ABNORMAL LOW (ref 6.3–7.7)

## 2015-05-31 LAB — DIFF MANUAL
Bands %: 2 % (ref 0–10)
Diff Based On: 114 CELLS
Metamyelocyte %: 5 % — ABNORMAL HIGH (ref 0–1)
Myelocyte %: 6 % — ABNORMAL HIGH (ref 0–0)

## 2015-05-31 LAB — RBC MORPHOLOGY

## 2015-05-31 LAB — MAGNESIUM: Magnesium: 1.6 mEq/L (ref 1.3–2.1)

## 2015-05-31 LAB — TACROLIMUS LEVEL: Tacrolimus: 11.3 ng/mL

## 2015-05-31 MED ORDER — DILTIAZEM HCL CR 60 MG PO CP12 *I*
180.0000 mg | ORAL_CAPSULE | Freq: Two times a day (BID) | ORAL | Status: DC
Start: 2015-05-31 — End: 2015-06-04
  Administered 2015-05-31 – 2015-06-04 (×7): 180 mg via ORAL
  Filled 2015-05-31 (×10): qty 1

## 2015-05-31 MED ORDER — DILTIAZEM HCL 90 MG PO TABS *I*
90.0000 mg | ORAL_TABLET | Freq: Once | ORAL | Status: AC
Start: 2015-05-31 — End: 2015-05-31
  Administered 2015-05-31: 90 mg via ORAL
  Filled 2015-05-31: qty 1

## 2015-05-31 MED ORDER — HYDROMORPHONE PCA 1 MG/ML *WRAPPED*
INTRAMUSCULAR | Status: DC
Start: 2015-05-31 — End: 2015-06-02
  Filled 2015-05-31 (×3): qty 25

## 2015-05-31 MED ORDER — FUROSEMIDE 10 MG/ML IJ SOLN *I*
20.0000 mg | Freq: Once | INTRAMUSCULAR | Status: AC
Start: 2015-05-31 — End: 2015-05-31
  Administered 2015-05-31: 20 mg via INTRAVENOUS
  Filled 2015-05-31: qty 2

## 2015-05-31 NOTE — Plan of Care (Addendum)
Comfort - BMT-related     Nausea controlled or eliminated Maintaining     Diarrhea controlled or eliminated Maintaining     Without infection Maintaining        Graft Versus Host Disease (GVHD) Bundle     Without rash Maintaining     Stool output within parameters (</= 535ml in 24 h) Maintaining     Total bilirubin </= 2 Maintaining        Pain/Comfort     Patient's pain or discomfort is manageable Maintaining        Psychosocial     Demonstrates ability to cope with illness Maintaining        Safety     Patient will remain free of falls Maintaining          Mobility     Patient's functional status is maintained or improved Progressing towards goal        Nutrition     Patient's nutritional status is maintained or improved Progressing towards goal        Pt tachy 90-100s, all other vitals stable. Lethargic but arouses to voice. Remains 2 person assist when ambulating to bathroom. Dilaudid dose decreased but pain remains well controlled. Diltiazem drip discontinued pt given 1 time 90 mg tab in addition to 20 mg lasix. Appetite remains poor.

## 2015-05-31 NOTE — Progress Notes (Signed)
[facility] Marrow Transplant Program APP Progress Note    CC   Carly Higgins is a 55 y.o. 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 By [employer] testing, she has mutations of TET2, ASXL1, CCND2, GATA2, SRSF2.     Admitted for MUD allogeneic PBSC transplant.     Length of stay  LOS: 20 days     Interval History   Overnight patient maintaining HR in one teens on diltiazem drip at 33m/hr.  Nursing reports 1 episode drowsiness, questionable AMS overnight.  Resident evaluation with normal neuro exam, A&O x 3.  CT A/P revealed ascites, continued cholecystitis.  No free air on KUB.  T bili continues to trend down.  This morning patient states she is feeling much better than yesterday/last night.  She is intermittently using supplemental O2 but states she feels less short of breath; continue to wean as able and will give Lasix 263mIV x 1.  Per Cardiology okay to transition diltiazem drip to oral formulation today, goal HR <100.  Not using Dilaudid PCA dose, decreased continuous rate and plan to decrease again this afternoon; hope to transition to oral pain medication tomorrow.      Review of Systems   A comprehensive review of 13 systems was performed; pertinent positives checked below:  _0  fevers  _1  fast pulse _2  fatigue   _3  chills _4  chest pain _5  dysuria    _6  dry eyes _7  nausea/emesis  _8  rash   _9  ear pain _10  abdominal cramping _11  skin changes   _12  sores in mouth _13  abdominal discomfort _14  pain   _15  sore throat _16  loose stool, non-formed _17  none of the above   _18  cough  _19  diarrhea, watery    _20  sputum production  _21  weight loss    _22  shortness of breath _23  insufficient oral intake      Vital Signs and I&O   Temp:  [34.3 C (93.7 F)-36.9 C (98.4 F)] 36.2 C (97.2 F)  Heart Rate:  [89-164] 118  Resp:  [16-24] 18  BP: (92-130)/(56-97) 119/74  FiO2:  [1.5 %-2 %] 2 %    Intake/Output Summary (Last 24 hours) at 05/31/15 1107  Last data filed at  05/31/15 094782 Gross per 24 hour   Intake          3046.59 ml   Output             1650 ml   Net          1396.59 ml        Physical Exam     Head:  Normocephalic, atraumatic   Eyes:  Conjunctiva/corneas clear, icterus noted    Throat: Oral mucosa pink & moist; healing lesions    Lungs:   Bilateral wheezing diffusely, tachypneic, increased WOB   Heart:  Tachycardic, regular rhythm, no murmur/gallop/rub   Abdomen:   Improved distention, nontender, BS present   Extremities: No edema   Pulses: 2+ and symmetric   Skin: No rashes or lesions, jaundice noted   Neurologic: A&O X 3, CMS intact   Central venous access: IVAD - C/D/I      Assessment & Plan   Carly Higgins a [age]o female with unclassifiable myeloproliferative neoplasm.  By [employer] testing, she has mutations of TET2, ASXL1, CCND2, GATA2, SRSF2. Admitted for MUD allogeneic PBSCT.     Atypical MPN  Completed Decitabine 206m2 IV daily x 5 days - Cycle #  1 12/15/14  On intermittent/dose adjusted hydroxyurea at admission - discontinued 5/5 in setting of falling WBC     Day -5 thru -2 (5/4-5/7):  Flu 51m/m2, Bu 1317mm2 - Busulfan dose adjusted to 28558mor last 2 doses (from 244m76mDay -1 (5/8): Rest day  Day 0 (5/9): MUD (10/10 HLA, female) allo PBSCT, ABO: A+/A+, CMV: neg/neg     Today is BMT day +13    Pancytopenia  Transfuse 1 unit RBCs for hematocrit <21% and/or transfuse 1 unit platelets for platelet count <11,000 OR transfuse 1 unit platelets for platelet count <20,000 if bleeding or fever.  Helmet for safety during periods of thrombocytopenia, per hospital policy    DVT ppx  Enoxaparin - on hold for plts <30K     ID  Cholecystitis on abd US 5[place]9  T bili 6.1, trending down now   Cipro/Flagyl started - Cipro stopped 5/20 (see below)    PNA on CTA   Tachycardic 5/20 - Cipro stopped, vancomycin 2g x 1 dose, started cefepime, cont Flagyl; CXR pulm edema vs infection   Hypoxic 5/21 - negative for PE, likely PNA    Azithromycin added 5/22 for  atypical coverage   Cont supplemental O2, wean as tolerated     Viral prophylaxis with acyclovir  Fungal prophylaxis with fluconazole    CV/pulm  Albuterol, Duonebs ATC wheezing   5/20: sudden tachycardia to 160s, no response to 500cc bolus; CXR with pulm edema vs infection  5/21: hypoxic, still tachy; CTA negative for PE, dye reflux into liver vasculature, echo WNL; metoprolol 12.5mg 58m started with no effect   5/22: continued tachy, Cardio consulted; dilt 20mg 46mpush then 10mg/h67mts, increased metoprolol to 25mg Q680m/23: HR one teens; per Cardio converting to 180mg BID35moral ER dilt; goal HR <100     Pulm  Hypoxic 5/21, CTA negative for PE but shows likely PNA, MIVF stopped, Lasix x 1 40mg give74mth good effect  Weaning O2 as tolerated     Endo  Hyperglycemia resolved - discontinue SSI    GVHD  Planned GVHD prophylaxis with tacrolimus and methotrexate  Tacro by continuous IV infusion beginning on day-1 via PIV  Target tacrolimus level 8-12    Methotrexate will dose equivalent leucovorin will be adminstered as follows:   15 mg/m2 on day +1 (>24 hrs from stem cell infusion) (full dose given)  10 mg/m2 on days +3, +6, and +11     Day 11 MTX held due to T biliof 5.4, mucositis     Renal  Creatinine at admission was 0.7, stable  Encouraged PO intake   Foley placed 5/21, removed overnight, PVR negative   Lasix 20mg x 1 o14m23      Electrolyte imbalance  Replace electrolytes as to the following parameters  Hypokalemia, supplement when serum K <3.6  Hypomagnesemia, supplement when serum Mag <1.5   Hypocalcemia, supplement when corrected calcium <8.9     At risk for malnutrition/hypoalbuminemia  Regular diet  Albumin level on admission 4.3  Nutrition following     GI  ATC Zofran  PRN Phenergan simethicone   Protonix BID, PRN Tums, carafate for GERD   PRN Tucks, Anusol for rectal/anal pain  Colace and senna PRN constipation     VOD/SOS  Moderate risk with ablative BU/Flu  Ursodiol ppx  5/19: T bili 6.1,  jaundice, nausea; abdominal Doppler US cw choleKoreastitis, started cipro/flagyl  5/20: T bili 5.4, stop cipro, start cefepime  5/21: T bili 5.0  5/22: T bili 3.5, increased abd distention/pain; KUB to R/O free air negative; CT A/P with cholecystitis and ascites  5/23: T bili 2.3, abd distention/pain improved     Mucositis  WHO Grade 3 - improving   Dilaudid PCA switched to morphine 5/19 - switched back to Dilaudid 5/22 in setting increased abd pain, avoiding sphincter of Oddi spasm  Cont good oral care, normal saline rinses  PRN BMX    GU  Hematuria x 1, no recurrence  NGTD on urine cx   Foley placed 5/21, removed that night, PVR negative     At risk for deconditioning   Encouraged physical activity, ambulation  Consult PT if needed    Psychosocial  Cont emotional support     Smoking Cessation  Offered nicotine replacement & [facility]; patient declining at this time    Dispo planning  Anticipate discharge following engraftment and clinical stability  BMT consent signed  BMT attending [name]  Primary care physician [name]  RN coordinator Anne/Sharon  Lives in [place]    Patient seen and discussed with Dr. Samuel Germany, PA    Lab Data      Lab results: 05/31/15  1610 05/30/15  2124 05/30/15  0050 05/29/15  0028  05/14/15  0106 05/13/15  0107 05/12/15  0527 05/11/15  1649   WBC 5.4  --  3.0* 1.1*  < > 35.4* 44.3* 56.1* 43.7*   Hemoglobin 8.3*   8.8* 8.7* 7.8* 7.9*  < > 7.9* 6.5* 7.2* 7.1*   Hematocrit 25*  --  23* 22*  < > 25* 21* 23* 22*   RBC 3.0*  --  2.9* 2.8*  < > 2.7* 2.2* 2.5* 2.4*   Platelets 107*  --  62* 32*  < > 47* 54* 62* 59*   Neut # K/uL 2.8  --  1.7 0.4*  < > 29.0* 32.8* 38.7* 27.1*   Lymph # K/uL 0.6*  --  0.3* 0.2*  < > 1.1* 2.2 4.5* 2.6   Mono # K/uL 1.4*  --  0.8 0.4  < > 0.3 0.0* 1.4* 0.4   Eos # K/uL 0.0  --  0.0 0.0  < > 0.0 0.4 0.0 0.0   Baso # K/uL 0.1  --  0.0 0.0  < > 0.0 0.0 0.0 0.0   Seg Neut % 50.0  --  53.6 30.9  < > 71.9 56.0  49.3 53.5   Lymphocyte % 10.5  --  9.6 17.3  < > 2.6 5.3 7.6 6.1   Monocyte % 25.4  --  27.2 39.5  < > 0.9 0.0 2.5 0.9   Eosinophil % 0.0  --  0.0 0.0  < > 0.0 0.9 0.0 0.0   Basophil % 0.9  --  0.0 0.0  < > 0.0 0.0 0.0 0.0   Bands % 2  --  4 9  < > 10 18* 20* 8   Myelocyte % 6*  --  3* 1*  < > 9* 4* 2* 12*   Metamyelocyte % 5*  --  4* 3*  < > 4* 13* 17* 12*   Promyelocyte %  --   --   --   --   --  2* 1*  --  4*   Blasts %  --   --   --   --   --   --  2* 2* 3*   < > =  values in this interval not displayed.            Lab results: 05/31/15  0057 05/30/15  2124 05/30/15  0050   Sodium 134 133 133   Potassium 4.2 4.1 4.2   Chloride 101 99 98   CO2 20 20 19*   UN 36* 39* 33*   Creatinine 0.88 0.90 0.99*   GFR,Caucasian 74 72 64   GFR,Black 86 83 74   Glucose 150* 133* 165*   Calcium 8.6 7.8* 8.6           Lab results: 05/31/15  0057   Magnesium 1.6       Lab Results   Component Value Date    ALT 34 05/31/2015    AST 19 05/31/2015     Lab Results   Component Value Date    ALK 68 05/31/2015     Lab Results   Component Value Date    TB 2.3 (H) 05/31/2015     LD   Date Value Ref Range Status   05/30/2015 203 118 - 225 U/L Final     LD, PL   Date Value Ref Range Status   05/09/2015 1146 (H) 118 - 225 U/L Final     LDL Calculated   Date Value Ref Range Status   08/27/2013 113 mg/dL Final     Comment:     REFERENCE RANGE:  < 100 Optimal                  100-129 Near or above optimal                  130-159 Borderline High                  160-189 High                    > 189 Very High

## 2015-05-31 NOTE — Progress Notes (Addendum)
Cardiology Consult Note    Date of Consult: 05/31/2015    Name: Carly Higgins Attending: Assunta Curtis, MD   DOB: April 02, 1960 PCP: Benedict Needy, MD   MRN: [id number] Primary Cardiologist: None   CSN: [id number] Admit Date:  05/11/2015     History of Present Illness     I had the pleasure of seeing Carly Higgins in cardiology follow-up on 05/31/2015. Carly Higgins is an 55 y.o. female who we were asked to see for tachycardia. She is post PBSCT and developed acute onset tachycardia Friday and has been feeling palpitations since. Her symptoms have drastically improved with rate control on diltiazem.    Past Medical and Surgical History     Past Medical History:   Diagnosis Date    GERD (gastroesophageal reflux disease)     Shortness of breath      Past Surgical History:   Procedure Laterality Date    ROTATOR CUFF REPAIR Right        Medications and Allergies     Prescriptions Prior to Admission   Medication Sig    Meds & Orders Exist in Blood Admin Plan     traMADol (ULTRAM) 50 MG tablet     naproxen (NAPROSYN) 500 MG tablet Take 500 mg by mouth 2 times daily (with meals)    hydroxyurea (HYDREA) 500 MG capsule Take 1 capsule (500 mg total) by mouth daily    cyclobenzaprine (FLEXERIL) 10 MG tablet Take 10 mg by mouth 3 times daily as needed for Muscle spasms    prochlorperazine (COMPAZINE) 10 MG tablet Take 1 tablet (10 mg total) by mouth 4 times daily as needed for Nausea    escitalopram (LEXAPRO) 10 MG tablet Take 15 mg by mouth daily    allopurinol (ZYLOPRIM) 300 MG tablet Take 1 tablet (300 mg total) by mouth daily     Current Facility-Administered Medications   Medication Dose Route Frequency    diltiazem  90 mg Oral Once    diltiazem  180 mg Oral 2 times per day    furosemide IV  20 mg Intravenous Once    azithromycin  500 mg Intravenous Q24H    metoprolol  25 mg Oral Q6H    enoxaparin  40 mg Subcutaneous Daily    caspofungin (CANCIDAS) IVPB  50 mg Intravenous Q24H    ipratropium-albuterol  3 mL  Nebulization 4x Daily    simethicone  80 mg Oral 4x Daily    ceFEPime (MAXIPIME) IV  2 g Intravenous Q8H    docusate sodium  200 mg Oral 2 times per day    senna  1 tablet Oral 2 times per day    ursodiol  300 mg Oral TID    metroNIDAZOLE  500 mg Intravenous Q8H    acyclovir  200 mg Intravenous Q12H    pantoprazole  40 mg Intravenous Q12H    ondansetron  4 mg Oral Q6H    Or    ondansetron  4 mg Intravenous Q6H    nicotine  1 patch Transdermal Daily    magnesium sulfate  2,000 mg Intravenous Daily    potassium chloride  20 mEq Intravenous Daily    calcium gluconate  4.7 mEq Intravenous Daily    escitalopram  15 mg Oral Daily     She has No Known Allergies (drug, envir, food or latex).    Social and Family History     Family History   Problem Relation Age of Onset    No  Known Problems Mother     No Known Problems Father      Social History     Social History    Marital status: Divorced     Spouse name: N/A    Number of children: N/A    Years of education: N/A     Occupational History    Not on file.     Social History Main Topics    Smoking status: Current Every Day Smoker     Packs/day: 0.50     Years: 30.00    Smokeless tobacco: Not on file    Alcohol use Yes      Comment: occassional    Drug use: No    Sexual activity: Not on file     Social History Narrative         Review of Systems     Review of Systems   Cardiovascular: Positive for palpitations. Negative for chest pain and orthopnea.   Musculoskeletal: Negative for falls.   Neurological: Negative for loss of consciousness.     Vitals and Physical Exam     Carly Higgins's  height is 1.676 m (5\' 6" ) and weight is 122.4 kg (269 lb 13.5 oz). Her temporal temperature is 36.5 C (97.7 F) (pended). Her blood pressure is 104/67 (pended) and her pulse is 78 (pended). Her respiration is 18 (pended) and oxygen saturation is 98% (pended).  Body mass index is 43.55 kg/(m^2).  I/O last 3 completed shifts:  05/22 0700 - 05/23 0659  In: 2322.6 (19 mL/kg)  [P.O.:660; I.V.:146.6 (0 mL/kg/hr); IV Piggyback:1516]  Out: 1250 (10.2 mL/kg) [Urine:1200 (0.4 mL/kg/hr); Stool:50]  Net: 1072.6  Weight: 122.4 kg   Physical Exam   General: Pleasant. Alert/interactive. NAD.  HEENT: MMM  Cor: regular tachycardia  Pulm: Normal WOB, CTA-B  Ext: WWP, 2+ peripheral pulses  Neuro: A&O to conversation      Laboratory Data     Hematology:   Results in Past 365 Days  Result Component Current Result Previous Result   WBC 5.4 (05/31/2015) 3.0 (L) (05/30/2015)   Hemoglobin 8.8 (L) (05/31/2015) 8.7 (L) (05/30/2015)    8.3 (L) (05/31/2015)    Hematocrit 25 (L) (05/31/2015) 23 (L) (05/30/2015)   Platelets 107 (L) (05/31/2015) 62 (L) (05/30/2015)     Chemistry:   Results in Past 365 Days  Result Component Current Result Previous Result   Sodium 134 (05/31/2015) 133 (05/30/2015)   Potassium 4.2 (05/31/2015) 4.1 (05/30/2015)   Creatinine 0.88 (05/31/2015) 0.90 (05/30/2015)   Glucose 150 (H) (05/31/2015) 133 (H) (05/30/2015)   Calcium 8.6 (05/31/2015) 7.8 (L) (05/30/2015)   AST 19 (05/31/2015) 16 (05/30/2015)   ALT 34 (05/31/2015) 31 (05/30/2015)   TSH 3.63 (12/31/2014) Not in Time Range     Coagulation Studies:   Results in Past 365 Days  Result Component Current Result Previous Result   aPTT 26.5 (05/11/2015) 23.8 (L) (05/03/2015)   INR 1.3 (H) (05/27/2015) 1.1 (05/11/2015)     Cardiac:   No results found for requested labs within last 365 days.     Lipids:   Results in Past 365 Days  Result Component Current Result Previous Result   Triglycerides 156 (!) (05/26/2015) 347 (!) (05/11/2015)     Cardiac/Imaging Data     ECG/Telemetry: regular tachycardia at 110s-120s         Echo Complete 05/29/2015    Narrative  Tachycardia NOS.   Mildly hypertrophied LV with left atrial enlargement.    Normal calculated resting LVEF (60%) with reduced  stroke volume but no   significant wall motion abnormalities.    No significant valvular abnormalities.    Normal right heart size and function with estimated normal pulmonary   artery  pressures.                     Impression and Plan     Active Problems:    Myeloproliferative disease      In my clinical evaluation/judgment this patient does not have an AMI.      This is an 55 y.o. female with atrial flutter s/p PBSCT. She is quite symptomatic and states her palpitations started abruptly Friday and this given relatively fixed HR indicates likely atrial flutter. Unfortunately, given her low blood counts would be a good candidate for anticoagulation and cardioversion.    - Give 90 mg PO diltiazem short-acting now and discontinue diltiazem gtt in 1h  - Start 180 mg diltiazem 12h formulation BID this evening  - Consolidate current dose of metoprolol into 50 mg BID    Vickey Huger, MD  Electronically signed on 05/31/2015 at 11:57 AM.

## 2015-05-31 NOTE — Plan of Care (Signed)
Comfort - BMT-related     Diarrhea controlled or eliminated Maintaining     Without infection Maintaining        Graft Versus Host Disease (GVHD) Bundle     Without rash Maintaining     Stool output within parameters (</= 522ml in 24 h) Maintaining        Psychosocial     Demonstrates ability to cope with illness Maintaining        Safety     Patient will remain free of falls Maintaining          Comfort - BMT-related     Nausea controlled or eliminated Progressing towards goal        Graft Versus Host Disease (GVHD) Bundle     Total bilirubin </= 2 Progressing towards goal        Mobility     Patient's functional status is maintained or improved Progressing towards goal        Nutrition     Patient's nutritional status is maintained or improved Progressing towards goal        Pain/Comfort     Patient's pain or discomfort is manageable Progressing towards goal

## 2015-05-31 NOTE — Progress Notes (Signed)
[facility] continues to follow this patient's hospital course for home care needs.  Please call for questions/concerns regarding the discharge plan.    Sue Soha Thorup, RN/HCC  [facility]  Cell: [phone]  After hours: [phone], Option - 4  Weekends/Holidays: [phone]  Fax: [phone]

## 2015-06-01 LAB — PHOSPHORUS: Phosphorus: 3.8 mg/dL (ref 2.7–4.5)

## 2015-06-01 LAB — COMPREHENSIVE METABOLIC PANEL
ALT: 26 U/L (ref 0–35)
AST: 11 U/L (ref 0–35)
Albumin: 2.8 g/dL — ABNORMAL LOW (ref 3.5–5.2)
Alk Phos: 58 U/L (ref 35–105)
Anion Gap: 13 (ref 7–16)
Bilirubin,Total: 1.7 mg/dL — ABNORMAL HIGH (ref 0.0–1.2)
CO2: 21 mmol/L (ref 20–28)
Calcium: 8.3 mg/dL — ABNORMAL LOW (ref 8.6–10.2)
Chloride: 101 mmol/L (ref 96–108)
Creatinine: 0.81 mg/dL (ref 0.51–0.95)
GFR,Black: 95 *
GFR,Caucasian: 82 *
Glucose: 162 mg/dL — ABNORMAL HIGH (ref 60–99)
Lab: 34 mg/dL — ABNORMAL HIGH (ref 6–20)
Potassium: 3.9 mmol/L (ref 3.3–5.1)
Sodium: 135 mmol/L (ref 133–145)
Total Protein: 4.9 g/dL — ABNORMAL LOW (ref 6.3–7.7)

## 2015-06-01 LAB — RBC MORPHOLOGY

## 2015-06-01 LAB — DIFF MANUAL
Bands %: 7 % (ref 0–10)
Diff Based On: 115 CELLS
Metamyelocyte %: 9 % — ABNORMAL HIGH (ref 0–1)
Myelocyte %: 6 % — ABNORMAL HIGH (ref 0–0)

## 2015-06-01 LAB — CBC AND DIFFERENTIAL
Baso # K/uL: 0 10*3/uL (ref 0.0–0.1)
Basophil %: 0 %
Eos # K/uL: 0 10*3/uL (ref 0.0–0.4)
Eosinophil %: 0 %
Hematocrit: 23 % — ABNORMAL LOW (ref 34–45)
Hemoglobin: 7.6 g/dL — ABNORMAL LOW (ref 11.2–15.7)
Lymph # K/uL: 0.2 10*3/uL — ABNORMAL LOW (ref 1.2–3.7)
Lymphocyte %: 5.2 %
MCH: 28 pg/cell (ref 26–32)
MCHC: 33 g/dL (ref 32–36)
MCV: 84 fL (ref 79–95)
Mono # K/uL: 0.9 10*3/uL (ref 0.2–0.9)
Monocyte %: 20.9 %
Neut # K/uL: 2.6 10*3/uL (ref 1.6–6.1)
Nucl RBC # K/uL: 0.2 10*3/uL — ABNORMAL HIGH (ref 0.0–0.0)
Nucl RBC %: 3.9 /100 WBC — ABNORMAL HIGH (ref 0.0–0.2)
Platelets: 102 10*3/uL — ABNORMAL LOW (ref 160–370)
RBC: 2.7 MIL/uL — ABNORMAL LOW (ref 3.9–5.2)
RDW: 17.1 % — ABNORMAL HIGH (ref 11.7–14.4)
Seg Neut %: 52.1 %
WBC: 4.4 10*3/uL (ref 4.0–10.0)

## 2015-06-01 LAB — TACROLIMUS LEVEL: Tacrolimus: 10.8 ng/mL

## 2015-06-01 LAB — LACTATE DEHYDROGENASE: LD: 173 U/L (ref 118–225)

## 2015-06-01 LAB — MAGNESIUM: Magnesium: 1.4 mEq/L (ref 1.3–2.1)

## 2015-06-01 MED ORDER — HEPARIN LOCK FLUSH 10 UNIT/ML IJ SOLN WRAPPED *I*
5.0000 mL | INTRAVENOUS | Status: DC | PRN
Start: 2015-06-01 — End: 2015-06-07
  Administered 2015-06-07: 50 [IU]
  Filled 2015-06-01 (×7): qty 5

## 2015-06-01 MED ORDER — FUROSEMIDE 10 MG/ML IJ SOLN *I*
20.0000 mg | Freq: Once | INTRAMUSCULAR | Status: AC
Start: 2015-06-01 — End: 2015-06-01
  Administered 2015-06-01: 20 mg via INTRAVENOUS
  Filled 2015-06-01: qty 2

## 2015-06-01 MED ORDER — LOPERAMIDE HCL 2 MG PO CAPS *I*
2.0000 mg | ORAL_CAPSULE | Freq: Four times a day (QID) | ORAL | Status: DC
Start: 2015-06-01 — End: 2015-06-02
  Administered 2015-06-01 – 2015-06-02 (×5): 2 mg via ORAL
  Filled 2015-06-01 (×5): qty 1

## 2015-06-01 MED ORDER — METOPROLOL TARTRATE 50 MG PO TABS *I*
50.0000 mg | ORAL_TABLET | Freq: Two times a day (BID) | ORAL | Status: DC
Start: 2015-06-01 — End: 2015-06-04
  Administered 2015-06-01 – 2015-06-03 (×6): 50 mg via ORAL
  Filled 2015-06-01 (×7): qty 1

## 2015-06-01 NOTE — Progress Notes (Signed)
Blood & Marrow Transplant Program APP Progress Note    CC   Carly Higgins is a 55 y.o. 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 By [employer] testing, she has mutations of TET2, ASXL1, CCND2, GATA2, SRSF2.     Admitted for MUD allogeneic PBSC transplant.     Length of stay  LOS: 21 days     Interval History   Lasix 38m IV x1.  Transitioned metoprolol to 532mBID.  Tacro level 10.8 no dose adjustment.  Increased stool volumes; added imodium ATC. Tbili 1.7. Plan to transition to oral medications tomorrow as able.     Review of Systems   A comprehensive review of 13 systems was performed; pertinent positives checked below:  []  fevers  []  fast pulse [x]  fatigue   []  chills []  chest pain []  dysuria    []  dry eyes []  nausea/emesis  []  rash   []  ear pain []  abdominal cramping []  skin changes   []  sores in mouth [x]  abdominal discomfort []  pain   []  sore throat [x]  loose stool, non-formed []  none of the above   []  cough  []  diarrhea, watery    []  sputum production  []  weight loss    [x]  shortness of breath []  insufficient oral intake      Vital Signs and I&O   Temp:  [35.7 C (96.3 F)-36.7 C (98.1 F)] 36.2 C (97.2 F)  Heart Rate:  [83-116] 104  Resp:  [18-22] 18  BP: (105-130)/(68-87) 127/78    Intake/Output Summary (Last 24 hours) at 06/01/15 1310  Last data filed at 06/01/15 0756   Gross per 24 hour   Intake          1725.93 ml   Output             1575 ml   Net           150.93 ml        Physical Exam     Head:  Normocephalic, atraumatic   Eyes:  Conjunctiva/corneas clear, icterus noted    Throat: Oral mucosa pink & moist; healing lesions    Lungs:   Bilateral wheezing diffusely, tachypneic, increased WOB   Heart:  Tachycardic, regular rhythm, no murmur/gallop/rub   Abdomen:   Improved distention, nontender, BS present   Extremities: No edema   Pulses: 2+ and symmetric   Skin: No rashes or lesions, jaundice noted   Neurologic: A&O X 3, CMS  intact   Central venous access: IVAD - C/D/I      Assessment & Plan   Carly PETTIs a [age]o female with unclassifiable myeloproliferative neoplasm.  By [employer] testing, she has mutations of TET2, ASXL1, CCND2, GATA2, SRSF2. Admitted for MUD allogeneic PBSCT.     Atypical MPN  Completed Decitabine 2068m2 IV daily x 5 days - Cycle # 1 12/15/14  On intermittent/dose adjusted hydroxyurea at admission - discontinued 5/5 in setting of falling WBC     Day -5 thru -2 (5/4-5/7):  Flu 63m83m, Bu 130mg94m- Busulfan dose adjusted to 285mg 63mlast 2 doses (from 244mg) 58m -1 (5/8): Rest day  Day 0 (5/9): MUD (10/10 HLA, female) allo PBSCT, ABO: A+/A+, CMV: neg/neg     Today is BMT day +14    Pancytopenia  Transfuse 1 unit RBCs for hematocrit <21% and/or transfuse 1 unit platelets for platelet count <11,000 OR transfuse 1 unit platelets for platelet count <20,000 if bleeding  or fever.  Helmet for safety during periods of thrombocytopenia, per hospital policy    DVT ppx  Enoxaparin - on hold for plts <30K     ID  Cholecystitis on abd [place] 5/19  T bili 1.7  Cipro/Flagyl started - Cipro stopped 5/20 (see below)    PNA on CTA   Tachycardic 5/20 - Cipro stopped, vancomycin 2g x 1 dose, started cefepime, cont Flagyl; CXR pulm edema vs infection   Hypoxic 5/21 - negative for PE, likely PNA    Azithromycin added 5/22 for atypical coverage - now complete  Cont supplemental O2, wean as tolerated     Viral prophylaxis with acyclovir  Fungal prophylaxis with fluconazole    CV/pulm  Albuterol, Duonebs ATC wheezing   5/20: sudden tachycardia to 160s, no response to 500cc bolus; CXR with pulm edema vs infection  5/21: hypoxic, still tachy; CTA negative for PE, dye reflux into liver vasculature, echo WNL; metoprolol 12.21m Q6H started with no effect   5/22: continued tachy, Cardio consulted; dilt 251mx 1 push then 1066mr gtts, increased metoprolol to 46m54mH  5/23: per Cardio converting to 180mg35m of oral ER dilt; goal HR  <100     Pulm  Hypoxic 5/21, CTA negative for PE but shows likely PNA, MIVF stopped, Lasix x 1 40mg 21mn with good effect  Weaning O2 as tolerated     Endo  Hyperglycemia resolved - discontinue SSI    GVHD  Planned GVHD prophylaxis with tacrolimus and methotrexate  Target tacrolimus level 8-12  full dose MTX given day 1,3,6  Day 11 MTX held due to T biliof 5.4, mucositis     Renal  Creatinine at admission was 0.7  Encouraged PO intake   20mg I84msix today     Electrolyte imbalance  Replace electrolytes as to the following parameters  Hypokalemia, supplement when serum K <3.6  Hypomagnesemia, supplement when serum Mag <1.5   Hypocalcemia, supplement when corrected calcium <8.9     At risk for malnutrition/hypoalbuminemia  Regular diet  Albumin level on admission 4.3  Nutrition following     GI  ATC Zofran  PRN Phenergan simethicone   Protonix BID, PRN Tums, carafate for GERD   PRN Tucks, Anusol for rectal/anal pain  Colace and senna PRN constipation     VOD/SOS  Moderate risk with ablative BU/Flu  Ursodiol ppx  5/19: T bili 6.1, jaundice, nausea; abdominal Doppler US cw cKorealecystitis, started cipro/flagyl  Tbili continues to trend down    Mucositis  WHO Grade 3 - improving   Dilaudid PCA switched to morphine 5/19 - switched back to Dilaudid 5/22 in setting increased abd pain, avoiding sphincter of Oddi spasm  Cont good oral care, normal saline rinses  PRN BMX    GU  Hematuria x 1, no recurrence  NGTD on urine cx   Foley placed 5/21, removed that night, PVR negative     At risk for deconditioning   Encouraged physical activity, ambulation  Consult PT if needed    Psychosocial  Cont emotional support     Smoking Cessation  Offered nicotine replacement & [place] [facility]; patient declining at this time    Dispo planning  Anticipate discharge following engraftment and clinical stability  BMT consent signed  BMT attending Dr. LiesvelLytle Buttery care physician Dr. CahnhidLenn Calordinator  [place] and discussed with Dr. Lipe   Raiford Simmonds  Lab Data      Lab results: 06/01/15  0117 05/31/15  0057 05/30/15  2124 05/30/15  0050  05/14/15  0106 05/13/15  0107 05/12/15  0527 05/11/15  1649   WBC 4.4 5.4  --  3.0*  < > 35.4* 44.3* 56.1* 43.7*   Hemoglobin 7.6* 8.3*   8.8* 8.7* 7.8*  < > 7.9* 6.5* 7.2* 7.1*   Hematocrit 23* 25*  --  23*  < > 25* 21* 23* 22*   RBC 2.7* 3.0*  --  2.9*  < > 2.7* 2.2* 2.5* 2.4*   Platelets 102* 107*  --  62*  < > 47* 54* 62* 59*   Neut # K/uL 2.6 2.8  --  1.7  < > 29.0* 32.8* 38.7* 27.1*   Lymph # K/uL 0.2* 0.6*  --  0.3*  < > 1.1* 2.2 4.5* 2.6   Mono # K/uL 0.9 1.4*  --  0.8  < > 0.3 0.0* 1.4* 0.4   Eos # K/uL 0.0 0.0  --  0.0  < > 0.0 0.4 0.0 0.0   Baso # K/uL 0.0 0.1  --  0.0  < > 0.0 0.0 0.0 0.0   Seg Neut % 52.1 50.0  --  53.6  < > 71.9 56.0 49.3 53.5   Lymphocyte % 5.2 10.5  --  9.6  < > 2.6 5.3 7.6 6.1   Monocyte % 20.9 25.4  --  27.2  < > 0.9 0.0 2.5 0.9   Eosinophil % 0.0 0.0  --  0.0  < > 0.0 0.9 0.0 0.0   Basophil % 0.0 0.9  --  0.0  < > 0.0 0.0 0.0 0.0   Bands % 7 2  --  4  < > 10 18* 20* 8   Myelocyte % 6* 6*  --  3*  < > 9* 4* 2* 12*   Metamyelocyte % 9* 5*  --  4*  < > 4* 13* 17* 12*   Promyelocyte %  --   --   --   --   --  2* 1*  --  4*   Blasts %  --   --   --   --   --   --  2* 2* 3*   < > = values in this interval not displayed.            Lab results: 06/01/15  0117 05/31/15  0057 05/30/15  2124   Sodium 135 134 133   Potassium 3.9 4.2 4.1   Chloride 101 101 99   CO2 21 20 20    UN 34* 36* 39*   Creatinine 0.81 0.88 0.90   GFR,Caucasian 82 74 72   GFR,Black 95 86 83   Glucose 162* 150* 133*   Calcium 8.3* 8.6 7.8*           Lab results: 06/01/15  0117   Magnesium 1.4       Lab Results   Component Value Date    ALT 26 06/01/2015    AST 11 06/01/2015     Lab Results   Component Value Date    ALK 58 06/01/2015     Lab Results   Component Value Date    TB 1.7 (H) 06/01/2015     LD   Date Value Ref Range Status    06/01/2015 173 118 - 225 U/L Final     LD, PL   Date Value Ref Range Status  05/09/2015 1146 (H) 118 - 225 U/L Final     LDL Calculated   Date Value Ref Range Status   08/27/2013 113 mg/dL Final     Comment:     REFERENCE RANGE:  < 100 Optimal                  100-129 Near or above optimal                  130-159 Borderline High                  160-189 High                    > 189 Very High

## 2015-06-01 NOTE — Interdisciplinary Rounds (Signed)
Interdisciplinary Rounds Note    Date: 06/01/2015   Time: 4:38 PM   Attendance:  Attending, PA, Charge RN    Admit Date/Time:  05/11/2015  2:02 PM    Principal Problem: <principal problem not specified>  Problem List:   Patient Active Problem List    Diagnosis Date Noted    Myeloproliferative disease 05/11/2015    MPN (myeloproliferative neoplasm) 01/19/2014    Leukocytosis 09/22/2013       The patient's problem list and interdisciplinary care plan was reviewed.    Discharge Planning                 *Does patient currently have home care services?: No     *Current External Services: None                      Plan    05/14/15 Day -3.  Will receive Flu/Bu tonight.  Pt tolerating chemo well.  Continue supportive care.   5/7: doing well, continue supportive care  5/8:  Day -1.  Mild nausea, fatigue.  Continue supportive care.  05/17/2015 Day 0, WBC 3.3, ANC 3.2, Hct 23, plts 36, will receive MUD PBSCs today, continue supportive care.  05/18/2015 Day +1 WBC 3.9, ANC 3.7, hct 21, plts 52K, received MUD PBSCs yesterday, tolerated well, MTX given per protocol, continue supportive care.  05/19/15:  Day +2.  Counts dropping with ANC 2600 today.  Glucose control with BG's and SS insulin coverage d/t steroids.  Continue supportive care.  05/20/15 D +3  Labs stable/falling. Multiple episodes of vomiting yesterday, abd discomfort.   Continue supportive care.  5/13: Day +4. BMX for mucositis. Dizzy- 500 ml bolus and add daily prn bolus. Helmet/assist out of bed.  05/22/15: Day +5 today, c/o rectal pain after having diarrhea, given topicals to alleviate burning discomfort, feeling better today. Continue supportive care.  05/23/15:  Day +6.  Mild complaints of nausea and diarrhea.  Check stool for C-Diff and if negative will start imodium.  Complaining of rectal pain - adjust medications to control discomfort.  Due fpr MTX today.  ANC 700,  Plt 7 K and hct 19 - transfused with both products today. Continue supportive care.  05/24/2015 Day  +7 WBC 0.6, ANC 0.4, hct 17, plts 9K, transfused with plts, will receive PRBCs, complaining of mucusitis pain, not eating, encouraged pt to take pain medication, also needs encouragement to get OOB, otherwise continue supportive care.  05/25/2015 Day +8 WBC 0.2, ANC 0.1, hct 20, plts 15K, complaining of increased pain for which the dilaudid helps but does not last per pt, starting PCA, continue other supportive measures.  05/26/15:  Day +9, nadired.  Receiving blood and plts today.  Will stop BG's and SS insulin coverage.  Encourage patient increase activity.  Continue supportive care.  05/27/15: Day +10.  Nadired, plts 10K and hct 19.  T.bili today 6.1.  Ursodiol increased to tid.  U/S of liver today.  Check PT/PTT.  Mucositis worse, tongue very swollen.  Continue IVF to hydrate.  Change diflucan to caspofungin as may contribute to increasing T.bili further.  Change dilaudid PCA to Morphine d/t persistent nausea. Continue supportive care.  05/28/15: Day +11 T-bili still increased at 5.4. No MTX today. Tachy to 150s this AM, bolus given. Still having persistent nausea, cont with antiemetics. Continue supportive care.  05/29/15: Day +12. HR still in the 150s, ANC 1100 today. CT angio done last night and ECHO to be done today. Decreasing  fluids to 10ml/hr. Pt states feeling better this morning. D/C scopolamine patch d/t urinary retention. Continue supportive care.   05/30/15:  Day +13.  Engrafting, WBC 3.0, ANC 1700.  T/bili down to 3.5.  Pt complains of SOB, increased pain in abdomen and throat.  HR continues to be sustained and elevated despite metoprolol.  Cardiology consult today, increase metoprolol to 25 mg q 6 hours with parameters.  Check KUB/CT abdomen.  Add empiric azithromycin. Change Morphine PCA to dilaudid for better pain control.  Had foley in over weekend and was d/c'ed at pt request - will recheck post void residual.  Continue supportive care.  06/01/2015 Day +15 WBC 4.4, ANC 2.6, hct 23, plts 102K, changed  from IV to po cardizem yesterday, sinus tacy 110's, will d/c tele today, continues on PCA with continued complaints of mucusitis pain, continues with edema and c/o SOB, will give lasix, 900 stool yesterday, thick but not watery, continue supportive care.    Anticipated Discharge Date:  Discharge Disposition: Home, choiced to [facility]

## 2015-06-01 NOTE — Progress Notes (Signed)
Brief Cardiology note    Reviewed telemetry and chart for Carly Higgins and she is now in sinus tachycardia. Continue current diltiazem dose.     Cardiology will sign off.    Vickey Huger, MD

## 2015-06-01 NOTE — Plan of Care (Signed)
Comfort - BMT-related     Nausea controlled or eliminated Maintaining     Diarrhea controlled or eliminated Maintaining     Without infection Maintaining        Graft Versus Host Disease (GVHD) Bundle     Without rash Maintaining     Stool output within parameters (</= 539ml in 24 h) Maintaining     Total bilirubin </= 2 Maintaining        Psychosocial     Demonstrates ability to cope with illness Maintaining        Safety     Patient will remain free of falls Maintaining          Mobility     Patient's functional status is maintained or improved Progressing towards goal        Nutrition     Patient's nutritional status is maintained or improved Progressing towards goal        Pain/Comfort     Patient's pain or discomfort is manageable Progressing towards goal          Pt. AVSS on room air, A&O x3.  Fall prevention teaching reinforced with patient, patient verbalized understanding.  Will continue to monitor and provide handoff to oncoming nurse for continuity of care.

## 2015-06-01 NOTE — Interdisciplinary Rounds (Signed)
Interdisciplinary Rounds Note    Date: 06/01/2015   Time: 9:27 AM   Attendance:  Attending, PA, Charge RN    Admit Date/Time:  05/11/2015  2:02 PM    Principal Problem: <principal problem not specified>  Problem List:   Patient Active Problem List    Diagnosis Date Noted    Myeloproliferative disease 05/11/2015    MPN (myeloproliferative neoplasm) 01/19/2014    Leukocytosis 09/22/2013       The patient's problem list and interdisciplinary care plan was reviewed.    Discharge Planning                 *Does patient currently have home care services?: No     *Current External Services: None                      Plan    05/14/15 Day -3.  Will receive Flu/Bu tonight.  Pt tolerating chemo well.  Continue supportive care.   5/7: doing well, continue supportive care  5/8:  Day -1.  Mild nausea, fatigue.  Continue supportive care.  05/17/2015 Day 0, WBC 3.3, ANC 3.2, Hct 23, plts 36, will receive MUD PBSCs today, continue supportive care.  05/18/2015 Day +1 WBC 3.9, ANC 3.7, hct 21, plts 52K, received MUD PBSCs yesterday, tolerated well, MTX given per protocol, continue supportive care.  05/19/15:  Day +2.  Counts dropping with ANC 2600 today.  Glucose control with BG's and SS insulin coverage d/t steroids.  Continue supportive care.  05/20/15 D +3  Labs stable/falling. Multiple episodes of vomiting yesterday, abd discomfort.   Continue supportive care.  5/13: Day +4. BMX for mucositis. Dizzy- 500 ml bolus and add daily prn bolus. Helmet/assist out of bed.  05/22/15: Day +5 today, c/o rectal pain after having diarrhea, given topicals to alleviate burning discomfort, feeling better today. Continue supportive care.  05/23/15:  Day +6.  Mild complaints of nausea and diarrhea.  Check stool for C-Diff and if negative will start imodium.  Complaining of rectal pain - adjust medications to control discomfort.  Due fpr MTX today.  ANC 700,  Plt 7 K and hct 19 - transfused with both products today. Continue supportive care.  05/24/2015 Day  +7 WBC 0.6, ANC 0.4, hct 17, plts 9K, transfused with plts, will receive PRBCs, complaining of mucusitis pain, not eating, encouraged pt to take pain medication, also needs encouragement to get OOB, otherwise continue supportive care.  05/25/2015 Day +8 WBC 0.2, ANC 0.1, hct 20, plts 15K, complaining of increased pain for which the dilaudid helps but does not last per pt, starting PCA, continue other supportive measures.  05/26/15:  Day +9, nadired.  Receiving blood and plts today.  Will stop BG's and SS insulin coverage.  Encourage patient increase activity.  Continue supportive care.  05/27/15: Day +10.  Nadired, plts 10K and hct 19.  T.bili today 6.1.  Ursodiol increased to tid.  U/S of liver today.  Check PT/PTT.  Mucositis worse, tongue very swollen.  Continue IVF to hydrate.  Change diflucan to caspofungin as may contribute to increasing T.bili further.  Change dilaudid PCA to Morphine d/t persistent nausea. Continue supportive care.  05/28/15: Day +11 T-bili still increased at 5.4. No MTX today. Tachy to 150s this AM, bolus given. Still having persistent nausea, cont with antiemetics. Continue supportive care.  05/29/15: Day +12. HR still in the 150s, ANC 1100 today. CT angio done last night and ECHO to be done today. Decreasing  fluids to 71ml/hr. Pt states feeling better this morning. D/C scopolamine patch d/t urinary retention. Continue supportive care.   05/30/15:  Day +13.  Engrafting, WBC 3.0, ANC 1700.  T/bili down to 3.5.  Pt complains of SOB, increased pain in abdomen and throat.  HR continues to be sustained and elevated despite metoprolol.  Cardiology consult today, increase metoprolol to 25 mg q 6 hours with parameters.  Check KUB/CT abdomen.  Add empiric azithromycin. Change Morphine PCA to dilaudid for better pain control.  Had foley in over weekend and was d/c'ed at pt request - will recheck post void residual.  Continue supportive care.  06/01/2015 Day +15 WBC 4.4, ANC 2.6, hct 23, plts 102K, changed  from IV to po cardizem yesterday, sinus tacy 110's, will d/c tele today, continues on PCA with continued complaints of mucusitis pain, continues with edema and c/o SOB, will give lasix, 900 stool yesterday, thick but not watery, continue supportive care.    Anticipated Discharge Date:  Discharge Disposition: Home, choiced to UR

## 2015-06-01 NOTE — Plan of Care (Signed)
Comfort - BMT-related    • Nausea controlled or eliminated Maintaining    • Diarrhea controlled or eliminated Maintaining    • Without infection Maintaining        Graft Versus Host Disease (GVHD) Bundle    • Without rash Maintaining    • Stool output within parameters (</= 500ml in 24 h) Maintaining    • Total bilirubin </= 2 Maintaining        Psychosocial    • Demonstrates ability to cope with illness Maintaining        Safety    • Patient will remain free of falls Maintaining          Mobility    • Patient's functional status is maintained or improved Progressing towards goal        Nutrition    • Patient's nutritional status is maintained or improved Progressing towards goal        Pain/Comfort    • Patient's pain or discomfort is manageable Progressing towards goal

## 2015-06-02 LAB — DIFF MANUAL
Bands %: 2 % (ref 0–10)
Diff Based On: 116 CELLS
Metamyelocyte %: 6 % — ABNORMAL HIGH (ref 0–1)
Myelocyte %: 9 % — ABNORMAL HIGH (ref 0–0)
Promyelocyte %: 3 % — ABNORMAL HIGH (ref 0–0)

## 2015-06-02 LAB — RBC MORPHOLOGY

## 2015-06-02 LAB — CBC AND DIFFERENTIAL
Baso # K/uL: 0 10*3/uL (ref 0.0–0.1)
Basophil %: 0 %
Eos # K/uL: 0 10*3/uL (ref 0.0–0.4)
Eosinophil %: 0.9 %
Hematocrit: 23 % — ABNORMAL LOW (ref 34–45)
Hemoglobin: 7.8 g/dL — ABNORMAL LOW (ref 11.2–15.7)
Lymph # K/uL: 0.1 10*3/uL — ABNORMAL LOW (ref 1.2–3.7)
Lymphocyte %: 2.6 %
MCH: 28 pg/cell (ref 26–32)
MCHC: 34 g/dL (ref 32–36)
MCV: 84 fL (ref 79–95)
Mono # K/uL: 0.5 10*3/uL (ref 0.2–0.9)
Monocyte %: 10.3 %
Neut # K/uL: 3.2 10*3/uL (ref 1.6–6.1)
Nucl RBC # K/uL: 0.2 10*3/uL — ABNORMAL HIGH (ref 0.0–0.0)
Nucl RBC %: 3.4 /100 WBC — ABNORMAL HIGH (ref 0.0–0.2)
Platelets: 125 10*3/uL — ABNORMAL LOW (ref 160–370)
RBC: 2.8 MIL/uL — ABNORMAL LOW (ref 3.9–5.2)
RDW: 17.4 % — ABNORMAL HIGH (ref 11.7–14.4)
Seg Neut %: 67.3 %
WBC: 4.7 10*3/uL (ref 4.0–10.0)

## 2015-06-02 LAB — COMPREHENSIVE METABOLIC PANEL
ALT: 26 U/L (ref 0–35)
AST: 13 U/L (ref 0–35)
Albumin: 3 g/dL — ABNORMAL LOW (ref 3.5–5.2)
Alk Phos: 59 U/L (ref 35–105)
Anion Gap: 13 (ref 7–16)
Bilirubin,Total: 1.6 mg/dL — ABNORMAL HIGH (ref 0.0–1.2)
CO2: 22 mmol/L (ref 20–28)
Calcium: 8.6 mg/dL (ref 8.6–10.2)
Chloride: 103 mmol/L (ref 96–108)
Creatinine: 0.72 mg/dL (ref 0.51–0.95)
GFR,Black: 109 *
GFR,Caucasian: 95 *
Glucose: 149 mg/dL — ABNORMAL HIGH (ref 60–99)
Lab: 23 mg/dL — ABNORMAL HIGH (ref 6–20)
Potassium: 3.9 mmol/L (ref 3.3–5.1)
Sodium: 138 mmol/L (ref 133–145)
Total Protein: 4.9 g/dL — ABNORMAL LOW (ref 6.3–7.7)

## 2015-06-02 LAB — MAGNESIUM: Magnesium: 1.3 mEq/L (ref 1.3–2.1)

## 2015-06-02 LAB — TACROLIMUS LEVEL: Tacrolimus: 12.5 ng/mL

## 2015-06-02 MED ORDER — DIPHENHYDRAMINE HCL 25 MG PO TABS *I*
25.0000 mg | ORAL_TABLET | Freq: Three times a day (TID) | ORAL | Status: DC | PRN
Start: 2015-06-02 — End: 2015-06-07

## 2015-06-02 MED ORDER — URSODIOL 300 MG PO CAPS *I*
300.0000 mg | ORAL_CAPSULE | Freq: Three times a day (TID) | ORAL | Status: DC
Start: 2015-06-02 — End: 2015-06-07
  Administered 2015-06-02 – 2015-06-06 (×14): 300 mg via ORAL
  Filled 2015-06-02 (×16): qty 1

## 2015-06-02 MED ORDER — TACROLIMUS 1 MG PO CAPS *I*
3.0000 mg | ORAL_CAPSULE | Freq: Two times a day (BID) | ORAL | Status: DC
Start: 2015-06-02 — End: 2015-06-06
  Administered 2015-06-02 – 2015-06-06 (×8): 3 mg via ORAL
  Filled 2015-06-02 (×10): qty 3

## 2015-06-02 MED ORDER — OXYCODONE HCL 5 MG PO TABS *I*
5.0000 mg | ORAL_TABLET | ORAL | Status: DC | PRN
Start: 2015-06-02 — End: 2015-06-07

## 2015-06-02 MED ORDER — PANTOPRAZOLE SODIUM 40 MG PO TBEC *I*
40.0000 mg | DELAYED_RELEASE_TABLET | Freq: Every morning | ORAL | Status: DC
Start: 2015-06-03 — End: 2015-06-07
  Administered 2015-06-03 – 2015-06-07 (×5): 40 mg via ORAL
  Filled 2015-06-02 (×6): qty 1

## 2015-06-02 MED ORDER — ONDANSETRON HCL 4 MG PO TABS *I*
4.0000 mg | ORAL_TABLET | Freq: Three times a day (TID) | ORAL | Status: DC
Start: 2015-06-02 — End: 2015-06-07
  Administered 2015-06-02 – 2015-06-07 (×14): 4 mg via ORAL
  Filled 2015-06-02 (×15): qty 1

## 2015-06-02 MED ORDER — LOPERAMIDE HCL 2 MG PO CAPS *I*
2.0000 mg | ORAL_CAPSULE | Freq: Four times a day (QID) | ORAL | Status: DC | PRN
Start: 2015-06-02 — End: 2015-06-07

## 2015-06-02 MED ORDER — CALCIUM CARBONATE ANTACID 500 MG PO CHEW *I*
1000.0000 mg | CHEWABLE_TABLET | Freq: Two times a day (BID) | ORAL | Status: DC
Start: 2015-06-02 — End: 2015-06-07
  Administered 2015-06-02 – 2015-06-07 (×10): 1000 mg via ORAL
  Filled 2015-06-02 (×11): qty 2

## 2015-06-02 MED ORDER — FUROSEMIDE 10 MG/ML IJ SOLN *I*
20.0000 mg | Freq: Once | INTRAMUSCULAR | Status: AC
Start: 2015-06-02 — End: 2015-06-02
  Administered 2015-06-02: 20 mg via INTRAVENOUS
  Filled 2015-06-02: qty 2

## 2015-06-02 MED ORDER — TACROLIMUS 5 MG/ML IV SOLN *I*
2.0000 mg/d | INTRAVENOUS | Status: AC
Start: 2015-06-02 — End: 2015-06-02
  Administered 2015-06-02: 2 mg/d via INTRAVENOUS
  Filled 2015-06-02: qty 0.51

## 2015-06-02 NOTE — Plan of Care (Signed)
Problem: Impaired Transfers  Goal: STG - IMPROVE TRANSFERS  Patient will complete Sit to stand transfers using least restrictive assistive device with Modified independence      Time frame: 3-5 days    Problem: Impaired Ambulation  Goal: STG - IMPROVE AMBULATION  Patient will ambulate 150-299 feet using least restrictive assistive device with Modified independence     Time frame: 3-5 days

## 2015-06-02 NOTE — Interdisciplinary Rounds (Signed)
Interdisciplinary Rounds Note    Date: 06/02/2015   Time: 7:42 AM   Attendance:  Attending, PA, Charge RN    Admit Date/Time:  05/11/2015  2:02 PM    Principal Problem: <principal problem not specified>  Problem List:   Patient Active Problem List    Diagnosis Date Noted    Myeloproliferative disease 05/11/2015    MPN (myeloproliferative neoplasm) 01/19/2014    Leukocytosis 09/22/2013       The patient's problem list and interdisciplinary care plan was reviewed.    Discharge Planning                 *Does patient currently have home care services?: No     *Current External Services: None                      Plan    05/14/15 Day -3.  Will receive Flu/Bu tonight.  Pt tolerating chemo well.  Continue supportive care.   5/7: doing well, continue supportive care  5/8:  Day -1.  Mild nausea, fatigue.  Continue supportive care.  05/17/2015 Day 0, WBC 3.3, ANC 3.2, Hct 23, plts 36, will receive MUD PBSCs today, continue supportive care.  05/18/2015 Day +1 WBC 3.9, ANC 3.7, hct 21, plts 52K, received MUD PBSCs yesterday, tolerated well, MTX given per protocol, continue supportive care.  05/19/15:  Day +2.  Counts dropping with ANC 2600 today.  Glucose control with BG's and SS insulin coverage d/t steroids.  Continue supportive care.  05/20/15 D +3  Labs stable/falling. Multiple episodes of vomiting yesterday, abd discomfort.   Continue supportive care.  5/13: Day +4. BMX for mucositis. Dizzy- 500 ml bolus and add daily prn bolus. Helmet/assist out of bed.  05/22/15: Day +5 today, c/o rectal pain after having diarrhea, given topicals to alleviate burning discomfort, feeling better today. Continue supportive care.  05/23/15:  Day +6.  Mild complaints of nausea and diarrhea.  Check stool for C-Diff and if negative will start imodium.  Complaining of rectal pain - adjust medications to control discomfort.  Due fpr MTX today.  ANC 700,  Plt 7 K and hct 19 - transfused with both products today. Continue supportive care.  05/24/2015 Day  +7 WBC 0.6, ANC 0.4, hct 17, plts 9K, transfused with plts, will receive PRBCs, complaining of mucusitis pain, not eating, encouraged pt to take pain medication, also needs encouragement to get OOB, otherwise continue supportive care.  05/25/2015 Day +8 WBC 0.2, ANC 0.1, hct 20, plts 15K, complaining of increased pain for which the dilaudid helps but does not last per pt, starting PCA, continue other supportive measures.  05/26/15:  Day +9, nadired.  Receiving blood and plts today.  Will stop BG's and SS insulin coverage.  Encourage patient increase activity.  Continue supportive care.  05/27/15: Day +10.  Nadired, plts 10K and hct 19.  T.bili today 6.1.  Ursodiol increased to tid.  U/S of liver today.  Check PT/PTT.  Mucositis worse, tongue very swollen.  Continue IVF to hydrate.  Change diflucan to caspofungin as may contribute to increasing T.bili further.  Change dilaudid PCA to Morphine d/t persistent nausea. Continue supportive care.  05/28/15: Day +11 T-bili still increased at 5.4. No MTX today. Tachy to 150s this AM, bolus given. Still having persistent nausea, cont with antiemetics. Continue supportive care.  05/29/15: Day +12. HR still in the 150s, ANC 1100 today. CT angio done last night and ECHO to be done today. Decreasing  fluids to 58ml/hr. Pt states feeling better this morning. D/C scopolamine patch d/t urinary retention. Continue supportive care.   05/30/15:  Day +13.  Engrafting, WBC 3.0, ANC 1700.  T/bili down to 3.5.  Pt complains of SOB, increased pain in abdomen and throat.  HR continues to be sustained and elevated despite metoprolol.  Cardiology consult today, increase metoprolol to 25 mg q 6 hours with parameters.  Check KUB/CT abdomen.  Add empiric azithromycin. Change Morphine PCA to dilaudid for better pain control.  Had foley in over weekend and was d/c'ed at pt request - will recheck post void residual.  Continue supportive care.  06/01/2015 Day +15 WBC 4.4, ANC 2.6, hct 23, plts 102K, changed  from IV to po cardizem yesterday, sinus tacy 110's, will d/c tele today, continues on PCA with continued complaints of mucusitis pain, continues with edema and c/o SOB, will give lasix, 900 stool yesterday, thick but not watery, continue supportive care.  06/02/15:  Day +16.  Counts recovering.  Diarrhea resolving - will change imodium to prn.  Change tacro to po, d/c continuous PCA.  Have evaluate and treat.  Encourage po intake and try to get ready to discharge to home this week.  Continue supportive care.              Anticipated Discharge Date:  Discharge Disposition: Home, choiced to UR

## 2015-06-02 NOTE — Progress Notes (Signed)
Blood & Marrow Transplant Program APP Progress Note    CC   Carly Higgins is a 55 y.o. 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 By [employer] testing, she has mutations of TET2, ASXL1, CCND2, GATA2, SRSF2.     Admitted for MUD allogeneic PBSC transplant.     Length of stay  LOS: 22 days     Interval History    TB continues to trend down, d/c'd PCA, changed a few meds to oral, changed [place] to oral, PT ordered, IV lasix x1 today, day 6 of ABX today for cholecystitis    Review of Systems   A comprehensive review of 13 systems was performed; pertinent positives checked below:  []  fevers  []  fast pulse [x]  fatigue   []  chills []  chest pain []  dysuria    []  dry eyes []  nausea/emesis  []  rash   []  ear pain []  abdominal cramping []  skin changes   []  sores in mouth [x]  abdominal discomfort []  pain   []  sore throat [x]  loose stool, non-formed []  none of the above   []  cough  []  diarrhea, watery    []  sputum production  []  weight loss    [x]  shortness of breath []  insufficient oral intake      Vital Signs and I&O   Temp:  [36 C (96.8 F)-36.6 C (97.9 F)] 36 C (96.8 F)  Heart Rate:  [85-103] 85  Resp:  [18-20] 18  BP: (105-144)/(65-82) 111/65    Intake/Output Summary (Last 24 hours) at 06/02/15 1354  Last data filed at 06/02/15 1237   Gross per 24 hour   Intake           1607.5 ml   Output             1750 ml   Net           -142.5 ml        Physical Exam     Head:  Normocephalic, atraumatic   Eyes:  Conjunctiva/corneas clear, icterus noted    Throat: Oral mucosa pink & moist; healing lesions    Lungs:   Bilateral wheezing diffusely, tachypneic, increased WOB   Heart:  Tachycardic, regular rhythm, no murmur/gallop/rub   Abdomen:   Improved distention, nontender, BS present   Extremities: No edema   Pulses: 2+ and symmetric   Skin: No rashes or lesions, jaundice noted   Neurologic: A&O X 3, CMS intact   Central venous access: IVAD - C/D/I       Assessment & Plan   Carly Higgins is a 55 yo female with unclassifiable myeloproliferative neoplasm.  By [employer] testing, she has mutations of TET2, ASXL1, CCND2, GATA2, SRSF2. Admitted for MUD allogeneic PBSCT.     Atypical MPN  Completed Decitabine 30m/m2 IV daily x 5 days - Cycle # 1 12/15/14  On intermittent/dose adjusted hydroxyurea at admission - discontinued 5/5 in setting of falling WBC     Day -5 thru -2 (5/4-5/7):  Flu 469mm2, Bu 13055m2 - Busulfan dose adjusted to 285m31mr last 2 doses (from 244mg4may -1 (5/8): Rest day  Day 0 (5/9): MUD (10/10 HLA, female) allo PBSCT, ABO: A+/A+, CMV: neg/neg     Today is BMT day +15    Pancytopenia  Transfuse 1 unit RBCs for hematocrit <21% and/or transfuse 1 unit platelets for platelet count <11,000 OR transfuse 1 unit platelets for platelet count <20,000 if bleeding or fever.  Helmet  for safety during periods of thrombocytopenia, per hospital policy    DVT ppx  Enoxaparin - on hold for plts <30K     ID  Cholecystitis on abd [place] 5/19  T bili 1.7  Cipro/Flagyl started - Cipro stopped 5/20 (see below)  Cefepime (5/20- to date), Flagyl (5/19- to date)    PNA on CTA   Tachycardic 5/20 - Cipro stopped, vancomycin 2g x 1 dose, started cefepime, cont Flagyl; CXR pulm edema vs infection   Hypoxic 5/21 - negative for PE, likely PNA    Azithromycin added 5/22 for atypical coverage - now complete  Cont supplemental O2, wean as tolerated     Viral prophylaxis with acyclovir  Fungal prophylaxis with fluconazole    CV/pulm  Albuterol, Duonebs ATC wheezing   5/20: sudden tachycardia to 160s, no response to 500cc bolus; CXR with pulm edema vs infection  5/21: hypoxic, still tachy; CTA negative for PE, dye reflux into liver vasculature, echo WNL; metoprolol 12.81m Q6H started with no effect   5/22: continued tachy, Cardio consulted; dilt 218mx 1 push then 1022mr gtts, increased metoprolol to 57m46mH  5/23: per Cardio converting to 180mg18m of oral ER dilt; goal HR  <100     Pulm  Hypoxic 5/21, CTA negative for PE but shows likely PNA, MIVF stopped, Lasix x 1 40mg 29mn with good effect  Weaning O2 as tolerated     Endo  Hyperglycemia resolved - discontinue SSI    GVHD  Planned GVHD prophylaxis with tacrolimus and methotrexate  Target tacrolimus level 8-12  full dose MTX given day 1,3,6  Day 11 MTX held due to T biliof 5.4, mucositis     Renal  Creatinine at admission was 0.7  Encouraged PO intake   20mg I41msix today     Electrolyte imbalance  Replace electrolytes as to the following parameters  Hypokalemia, supplement when serum K <3.6  Hypomagnesemia, supplement when serum Mag <1.5   Hypocalcemia, supplement when corrected calcium <8.9     At risk for malnutrition/hypoalbuminemia  Regular diet  Albumin level on admission 4.3  Nutrition following     GI  Zofran AC, simethicone ATC  PRN Phenergan   Protonix BID, PRN Tums, carafate for GERD   PRN Tucks, Anusol for rectal/anal pain  Colace and senna PRN constipation     VOD/SOS  Moderate risk with ablative BU/Flu  Ursodiol ppx    Hyperbilirubinemia  5/19: T bili 6.1, jaundice, nausea; abdominal Doppler US cw cKorealecystitis, started cipro/flagyl  Tbili continues to trend down    Mucositis  WHO Grade 3 - improving   Dilaudid PCA switched to morphine 5/19 - switched back to Dilaudid 5/22, d/c'd 5/25  Cont good oral care, normal saline rinses  PRN BMX    GU  Hematuria x 1, no recurrence  NGTD on urine cx   Foley placed 5/21, removed that night, PVR negative     At risk for deconditioning   Encouraged physical activity, ambulation  Consult PT if needed    Psychosocial  Cont emotional support     Smoking Cessation  Offered nicotine replacement & [place] [facility]ess[facility]; patient declining at this time    Dispo planning  Anticipate discharge following engraftment and clinical stability  BMT consent signed  BMT attending Dr. LiesvelLytle Buttery care physician Dr. CahnhidLenn Calordinator Anne/Sharon  [place] seen and discussed with Dr. Lipe   Erin Fulling  NP    Lab Data      Lab results: 06/02/15  0235 06/01/15  0117 05/31/15  3710  05/14/15  0106 05/13/15  0107 05/12/15  0527 05/11/15  1649   WBC 4.7 4.4 5.4  < > 35.4* 44.3* 56.1* 43.7*   Hemoglobin 7.8* 7.6* 8.3*   8.8*  < > 7.9* 6.5* 7.2* 7.1*   Hematocrit 23* 23* 25*  < > 25* 21* 23* 22*   RBC 2.8* 2.7* 3.0*  < > 2.7* 2.2* 2.5* 2.4*   Platelets 125* 102* 107*  < > 47* 54* 62* 59*   Neut # K/uL 3.2 2.6 2.8  < > 29.0* 32.8* 38.7* 27.1*   Lymph # K/uL 0.1* 0.2* 0.6*  < > 1.1* 2.2 4.5* 2.6   Mono # K/uL 0.5 0.9 1.4*  < > 0.3 0.0* 1.4* 0.4   Eos # K/uL 0.0 0.0 0.0  < > 0.0 0.4 0.0 0.0   Baso # K/uL 0.0 0.0 0.1  < > 0.0 0.0 0.0 0.0   Seg Neut % 67.3 52.1 50.0  < > 71.9 56.0 49.3 53.5   Lymphocyte % 2.6 5.2 10.5  < > 2.6 5.3 7.6 6.1   Monocyte % 10.3 20.9 25.4  < > 0.9 0.0 2.5 0.9   Eosinophil % 0.9 0.0 0.0  < > 0.0 0.9 0.0 0.0   Basophil % 0.0 0.0 0.9  < > 0.0 0.0 0.0 0.0   Bands % 2 7 2   < > 10 18* 20* 8   Myelocyte % 9* 6* 6*  < > 9* 4* 2* 12*   Metamyelocyte % 6* 9* 5*  < > 4* 13* 17* 12*   Promyelocyte % 3*  --   --   --  2* 1*  --  4*   Blasts %  --   --   --   --   --  2* 2* 3*   < > = values in this interval not displayed.            Lab results: 06/02/15  0235 06/01/15  0117 05/31/15  0057   Sodium 138 135 134   Potassium 3.9 3.9 4.2   Chloride 103 101 101   CO2 22 21 20    UN 23* 34* 36*   Creatinine 0.72 0.81 0.88   GFR,Caucasian 95 82 74   GFR,Black 109 95 86   Glucose 149* 162* 150*   Calcium 8.6 8.3* 8.6           Lab results: 06/02/15  0235   Magnesium 1.3       Lab Results   Component Value Date    ALT 26 06/02/2015    AST 13 06/02/2015     Lab Results   Component Value Date    ALK 59 06/02/2015     Lab Results   Component Value Date    TB 1.6 (H) 06/02/2015     LD   Date Value Ref Range Status   06/01/2015 173 118 - 225 U/L Final     LD, PL   Date Value Ref Range Status   05/09/2015 1146 (H) 118 - 225 U/L Final     LDL  Calculated   Date Value Ref Range Status   08/27/2013 113 mg/dL Final     Comment:     REFERENCE RANGE:  < 100 Optimal  100-129 Near or above optimal                  130-159 Borderline High                  160-189 High                    > 189 Very High

## 2015-06-02 NOTE — Progress Notes (Signed)
Utilization Management    Level of Care Inpatient as of the date 05/11/2015.      Loyal Gambler, RN     Pager: [phone]

## 2015-06-02 NOTE — Plan of Care (Signed)
Dilaudid PCA D/C per order. Pt lethargic and drowsy, hope to see improvement following PCA D/C.  Lasix given per order with goal of reducing general and peripheral edema. Pt instructed to use call bell when she needs to use the bathroom / bedside commode because of general drowsiness / fatigue to avoid injury. Pt reports no nausea but continues to have decreased appetite.  Pt weaned down from 2 L NC to room air with sat's above 92%.     Mobility     Patient's functional status is maintained or improved Progressing towards goal        Nutrition     Patient's nutritional status is maintained or improved Progressing towards goal            Comfort - BMT-related     Nausea controlled or eliminated Maintaining     Diarrhea controlled or eliminated Maintaining     Without infection Maintaining        Graft Versus Host Disease (GVHD) Bundle     Without rash Maintaining     Stool output within parameters (</= 553ml in 24 h) Maintaining     Total bilirubin </= 2 Maintaining        Pain/Comfort     Patient's pain or discomfort is manageable Maintaining        Psychosocial     Demonstrates ability to cope with illness Maintaining        Safety     Patient will remain free of falls Maintaining

## 2015-06-02 NOTE — Progress Notes (Signed)
Physical Therapy Initial Evaluation:     History of Present Admission: Carly Higgins is a 55 yo female with unclassifiable myeloproliferative neoplasm. By [employer] testing, she has mutations of TET2, ASXL1, CCND2, GATA2, SRSF2. Admitted for MUD allogeneic PBSCT.     Past Medical History:   Diagnosis Date    GERD (gastroesophageal reflux disease)     Shortness of breath         Past Surgical History:   Procedure Laterality Date    ROTATOR CUFF REPAIR Right        Personal factors affecting treatment/recovery:   none identified    Comorbidities affecting treatment/recovery:   Cancer (chemotherapy/radiation therapy)     06/02/15 1200   Prior Living    Prior Living Situation Reported by patient   Lives With Family  (Parents)   Receives Help From Family   Type of [place]   # Steps to [place] (ramp)   Medical Equipment in Home None   Additional Comments Pt states that she will be living with her parents after d/c for a few weeks, and will have 24/7 assist as needed.    Prior Function Level   Prior Function Level Reported by patient   Transfers Independent   Transfer Devices None   Walking Independent   Walking assistive devices used None   Additional Comments i   PT Tracking   PT TRACKING PT Assigned   Visit Number   Visit Number [facility]) / Treatment Day (HH) 1   Precautions/Observations   Precautions used Yes   LDA Observation IV lines;PCA;O2 (comment)   Fall Precautions General falls precautions   Pain Assessment   *Is the patient currently in pain? Yes   Pain (Before,During, After) Therapy Before;During;After   0-10 Scale (did not quantify)   Pain Location Throat   Pain Intervention(s) Repositioned;Refer to nursing for pain management   Cognition   Cognition No deficit noted   UE Assessment   UE Assessment Full range RUE AROM;Full range LUE AROM   LE Assessment   LE Assessment Full AROM RLE;Full AROM LLE   Sensation   Sensation No apparent deficit   Bed Mobility   Bed mobility Not tested    Additional comments Pt sitting up in recliner at beginning and end of session   Transfers   Transfers Tested   Sit to Stand Stand by assistance;1 person assist   Stand to sit Stand by assistance;1 person assist   Transfer Assistive Device rolling walker   Additional comments Pt needs stand by assist for verbal cues for safe technique and hand placement when transferring with rolling walker.    Mobility   Mobility Tested   Gait Pattern Decreased cadence;Shuffle;Trunk flexed   Ambulation Assist Stand by;1 person assist   Ambulation Distance (Feet) 30   Ambulation Assistive Device rolling walker   Additional comments Pt amb within the room with rolling walker and stand by assist. She has some SOB with ambulation and displays increased effort for short distance ambulation, but no losses of balance or unsteadiness noted.   Therapeutic Exercises   Additional comments Encouraged pt to ambulate with assist 2-3x per day to her tolerance.    Balance   Balance Tested   Sitting - Static Independent ;Supported   Sitting - Dynamic Independent;Supported   Standing - Static Supported  (stand by )   Standing - Dynamic Supported  (stand by)   Additional Comments   Additional comments Pt is primarily limited by  poor activity tolerance, and needs stand by assist for mobility at this time.    Assessment   Brief Assessment Appropriate for skilled therapy   Problem List Impaired endurance;Impaired transfers;Impaired ambulation;Impaired bed mobility   Patient / Family Goal Go home   Plan/Recommendation   Treatment Interventions Restorative PT;Bed mobility training;Transfers training;Balance training;Pt/Family education;Strengthening;Gait training;D/C planning   PT Frequency 3-5x/wk   Hospital Stay Recommendations Ambulate daily with nursing assist;Out of bed with nursing assist   Discharge Recommendations Intermittent supervision/assist;Anticipate return to prior living arrangement  (may need home PT pending progress)   PT Discharge  Equipment Recommended Rolling Walker   Assessment/Recommendations Reviewed With: Nursing;Patient   Next Visit Progress ambulation distance and activity tolerance.    Time Calculation   PT Timed Codes 0   PT Untimed Codes 25   PT Unbilled Time 0   PT Total Treatment 25   PT Charges   $PT [place] Charges Acute PT Eval Low Complexity - code 7161   PT AM-PAC Mobility   Turning over in bed? 1   Sitting down on and standing up from a chair with arms? 1   Moving from lying on back to sitting on the side of the bed? 1   Moving to and from a bed to a chair? 3   Need to walk in hospital room? 3   Climbing 3 - 5 steps with a railing? 1   Total Raw Score 10   Standardized Score 32.29   CMS 1-100% Score 77     Berenda Morale, PT, DPT

## 2015-06-03 ENCOUNTER — Other Ambulatory Visit: Payer: Self-pay | Admitting: Gastroenterology

## 2015-06-03 LAB — DIFF MANUAL
Bands %: 9 % (ref 0–10)
Diff Based On: 117 CELLS
Metamyelocyte %: 9 % — ABNORMAL HIGH (ref 0–1)
Myelocyte %: 3 % — ABNORMAL HIGH (ref 0–0)

## 2015-06-03 LAB — COMPREHENSIVE METABOLIC PANEL
ALT: 25 U/L (ref 0–35)
AST: 17 U/L (ref 0–35)
Albumin: 3.3 g/dL — ABNORMAL LOW (ref 3.5–5.2)
Alk Phos: 59 U/L (ref 35–105)
Anion Gap: 16 (ref 7–16)
Bilirubin,Total: 1.7 mg/dL — ABNORMAL HIGH (ref 0.0–1.2)
CO2: 23 mmol/L (ref 20–28)
Calcium: 8.6 mg/dL (ref 8.6–10.2)
Chloride: 103 mmol/L (ref 96–108)
Creatinine: 0.64 mg/dL (ref 0.51–0.95)
GFR,Black: 116 *
GFR,Caucasian: 101 *
Glucose: 148 mg/dL — ABNORMAL HIGH (ref 60–99)
Lab: 14 mg/dL (ref 6–20)
Potassium: 3.7 mmol/L (ref 3.3–5.1)
Sodium: 142 mmol/L (ref 133–145)
Total Protein: 5.3 g/dL — ABNORMAL LOW (ref 6.3–7.7)

## 2015-06-03 LAB — TACROLIMUS LEVEL: Tacrolimus: 8.6 ng/mL

## 2015-06-03 LAB — CBC AND DIFFERENTIAL
Baso # K/uL: 0.1 10*3/uL (ref 0.0–0.1)
Basophil %: 1.7 %
Eos # K/uL: 0 10*3/uL (ref 0.0–0.4)
Eosinophil %: 0 %
Hematocrit: 24 % — ABNORMAL LOW (ref 34–45)
Hemoglobin: 8.1 g/dL — ABNORMAL LOW (ref 11.2–15.7)
Lymph # K/uL: 0.4 10*3/uL — ABNORMAL LOW (ref 1.2–3.7)
Lymphocyte %: 6 %
MCH: 28 pg/cell (ref 26–32)
MCHC: 34 g/dL (ref 32–36)
MCV: 83 fL (ref 79–95)
Mono # K/uL: 0.7 10*3/uL (ref 0.2–0.9)
Monocyte %: 12.8 %
Neut # K/uL: 3.8 10*3/uL (ref 1.6–6.1)
Nucl RBC # K/uL: 0.2 10*3/uL — ABNORMAL HIGH (ref 0.0–0.0)
Nucl RBC %: 3.4 /100 WBC — ABNORMAL HIGH (ref 0.0–0.2)
Platelets: 141 10*3/uL — ABNORMAL LOW (ref 160–370)
RBC: 2.9 MIL/uL — ABNORMAL LOW (ref 3.9–5.2)
RDW: 17.5 % — ABNORMAL HIGH (ref 11.7–14.4)
Seg Neut %: 57.3 %
WBC: 5.8 10*3/uL (ref 4.0–10.0)

## 2015-06-03 LAB — LACTATE DEHYDROGENASE: LD: 228 U/L — ABNORMAL HIGH (ref 118–225)

## 2015-06-03 LAB — BLOOD CULTURE
Bacterial Blood Culture: 0
Bacterial Blood Culture: 0
Bacterial Blood Culture: 0

## 2015-06-03 LAB — PHOSPHORUS: Phosphorus: 3.3 mg/dL (ref 2.7–4.5)

## 2015-06-03 LAB — MAGNESIUM: Magnesium: 1.1 mEq/L — ABNORMAL LOW (ref 1.3–2.1)

## 2015-06-03 MED ORDER — TACROLIMUS 1 MG PO CAPS *I*
3.0000 mg | ORAL_CAPSULE | Freq: Two times a day (BID) | ORAL | 6 refills | Status: AC
Start: 2015-06-03 — End: 2015-07-03

## 2015-06-03 MED ORDER — HEPARIN LOCK FLUSH 10 UNIT/ML IJ SOLN WRAPPED *I*
5.0000 mL | Freq: Every day | INTRAVENOUS | 6 refills | Status: DC | PRN
Start: 2015-06-03 — End: 2015-06-17

## 2015-06-03 MED ORDER — GENERIC DME *A*
0 refills | Status: DC
Start: 2015-06-03 — End: 2015-06-09

## 2015-06-03 MED ORDER — TACROLIMUS 0.5 MG PO CAPS *WRAPPED*
0.5000 mg | ORAL_CAPSULE | Freq: Two times a day (BID) | ORAL | 6 refills | Status: DC
Start: 2015-06-03 — End: 2015-11-17

## 2015-06-03 MED ORDER — METOPROLOL TARTRATE 1 MG/ML IV SOLN *I*
5.0000 mg | Freq: Once | INTRAVENOUS | Status: AC
Start: 2015-06-03 — End: 2015-06-03
  Administered 2015-06-03: 5 mg via INTRAVENOUS
  Filled 2015-06-03: qty 5

## 2015-06-03 MED ORDER — FUROSEMIDE 10 MG/ML IJ SOLN *I*
20.0000 mg | Freq: Once | INTRAMUSCULAR | Status: AC
Start: 2015-06-03 — End: 2015-06-03
  Administered 2015-06-03: 20 mg via INTRAVENOUS
  Filled 2015-06-03: qty 2

## 2015-06-03 MED ORDER — FUROSEMIDE 10 MG/ML IJ SOLN *I*
40.0000 mg | Freq: Once | INTRAMUSCULAR | Status: AC
Start: 2015-06-03 — End: 2015-06-03
  Administered 2015-06-03: 40 mg via INTRAVENOUS
  Filled 2015-06-03: qty 4

## 2015-06-03 MED ORDER — SODIUM CHLORIDE 0.9 % INJ (FLUSH) WRAPPED *I*
10.0000 mL | 6 refills | Status: DC | PRN
Start: 2015-06-03 — End: 2015-06-17

## 2015-06-03 MED ORDER — LORAZEPAM 0.5 MG PO TABS *I*
0.5000 mg | ORAL_TABLET | Freq: Once | ORAL | Status: AC
Start: 2015-06-03 — End: 2015-06-03
  Administered 2015-06-03: 0.5 mg via ORAL
  Filled 2015-06-03: qty 1

## 2015-06-03 MED ORDER — FLUCONAZOLE 200 MG PO TABS *I*
200.0000 mg | ORAL_TABLET | Freq: Every evening | ORAL | Status: DC
Start: 2015-06-03 — End: 2015-06-07
  Administered 2015-06-03 – 2015-06-06 (×4): 200 mg via ORAL
  Filled 2015-06-03 (×5): qty 1

## 2015-06-03 MED ORDER — ACYCLOVIR 200 MG PO CAPS *I*
400.0000 mg | ORAL_CAPSULE | Freq: Two times a day (BID) | ORAL | Status: DC
Start: 2015-06-03 — End: 2015-06-07
  Administered 2015-06-03 – 2015-06-07 (×8): 400 mg via ORAL
  Filled 2015-06-03 (×10): qty 2

## 2015-06-03 MED ORDER — GENERIC DME *A*
6 refills | Status: DC
Start: 2015-06-03 — End: 2015-06-09

## 2015-06-03 MED ORDER — GENERIC DME *A*
6 refills | Status: DC
Start: 2015-06-03 — End: 2015-06-06

## 2015-06-03 MED ORDER — GENERIC DME *A*
3 refills | Status: DC
Start: 2015-06-03 — End: 2015-06-09

## 2015-06-03 NOTE — Progress Notes (Signed)
Blood & Marrow Transplant Program APP Progress Note    CC   Carly Higgins is a 55 y.o. 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 By [employer] testing, she has mutations of TET2, ASXL1, CCND2, GATA2, SRSF2.     Admitted for MUD allogeneic PBSC transplant.     Length of stay  LOS: 23 days     Interval History   Feeling tired, complaining of some shortness of breath and "bloating", encouraged ambulation. 40 mg IV lasix x 1, will repeat CXR this afternoon. Day 7 of ABX today for cholecystitis. Wean oxygen to keep saturation >90%.    Review of Systems   A comprehensive review of 13 systems was performed; pertinent positives checked below:  _0  fevers  _1  fast pulse _2  fatigue   _3  chills _4  chest pain _5  dysuria    _6  dry eyes _7  nausea/emesis  _8  rash   _9  ear pain _10  abdominal cramping _11  skin changes   _12  sores in mouth _13  abdominal discomfort _14  pain   _15  sore throat _16  loose stool, non-formed _17  none of the above   _18  cough  _19  diarrhea, watery    _20  sputum production  _21  weight loss    _22  shortness of breath _23  insufficient oral intake      Vital Signs and I&O   Temp:  [36 C (96.8 F)-37 C (98.6 F)] 36.8 C (98.2 F)  Heart Rate:  [85-101] 88  Resp:  [18-22] 18  BP: (111-141)/(63-82) 132/68    Intake/Output Summary (Last 24 hours) at 06/03/15 1144  Last data filed at 06/03/15 0841   Gross per 24 hour   Intake          2062.01 ml   Output             2775 ml   Net          -712.99 ml        Physical Exam     Head:  Normocephalic, atraumatic   Eyes:  Conjunctiva/corneas clear, icterus noted    Throat: Oral mucosa pink & moist; healing lesions    Lungs:   Bilateral wheezing diffusely, tachypneic, increased WOB   Heart:  Tachycardic, regular rhythm, no murmur/gallop/rub   Abdomen:   Improved distention, nontender, BS present   Extremities: No edema   Pulses: 2+ and symmetric   Skin: No rashes or lesions, jaundice noted   Neurologic: A&O X 3, CMS  intact   Central venous access: IVAD - C/D/I      Assessment & Plan   Carly Higgins is a 55 yo female with unclassifiable myeloproliferative neoplasm.  By [employer] testing, she has mutations of TET2, ASXL1, CCND2, GATA2, SRSF2. Admitted for MUD allogeneic PBSCT.     Atypical MPN  Completed Decitabine 76m/m2 IV daily x 5 days - Cycle # 1 12/15/14  On intermittent/dose adjusted hydroxyurea at admission - discontinued 5/5 in setting of falling WBC     Day -5 thru -2 (5/4-5/7):  Flu 414mm2, Bu 13044m2 - Busulfan dose adjusted to 285m75mr last 2 doses (from 244mg54may -1 (5/8): Rest day  Day 0 (5/9): MUD (10/10 HLA, female) allo PBSCT, ABO: A+/A+, CMV: neg/neg     Today is BMT day +16    Pancytopenia  Transfuse 1 unit RBCs for hematocrit <21% and/or transfuse 1 unit platelets for platelet count <11,000 OR transfuse 1 unit platelets for platelet count <20,000 if bleeding or  fever.  Helmet for safety during periods of thrombocytopenia, per hospital policy    DVT ppx  Enoxaparin - hold for plts <30K     ID  Cholecystitis on abd [place] 5/19  T bili 1.7, stable  Cipro/Flagyl started - Cipro stopped 5/20 (see below)  Cefepime (5/20- to date), Flagyl (5/19- to date)  Planing for 10 day course of Cefepime/flagyl    PNA on CTA   Tachycardic 5/20 - Cipro stopped, vancomycin 2g x 1 dose, started cefepime, cont Flagyl; CXR pulm edema vs infection   Hypoxic 5/21 - negative for PE, likely PNA    Azithromycin added 5/22 for atypical coverage - now complete  Cont supplemental O2, wean as tolerated     Viral prophylaxis with acyclovir  Fungal prophylaxis with fluconazole    CV  Acute onset tachycardia, cardiology was consulted- now resolved  Now on Diltiazem 180 mg BID, Metoprolol 50 mg BID  Cardiology signed off, call with concnerns    Pulm  Weaning O2 as tolerated, keeping saturation above 90%  40 mg Lasix 1 today today for fluid overload  Repeat CXR ordered  Albuterol, Duonebs ATC wheezing       GVHD  Planned GVHD  prophylaxis with tacrolimus and methotrexate  Target tacrolimus level 8-12, now on oral tacro  full dose MTX given day 1,3,6  Day 11 MTX held due to T biliof 5.4, mucositis     Renal  Creatinine at admission was 0.7, stable today at 0.64  Encouraged PO intake       Electrolyte imbalance  Replace electrolytes as to the following parameters  Hypokalemia, supplement when serum K <3.6  Hypomagnesemia, supplement when serum Mag <1.5, supplmeneted  Hypocalcemia, supplement when corrected calcium <8.9    At risk for malnutrition/hypoalbuminemia  Regular diet  Albumin level on admission 4.3  Nutrition following     GI  Zofran TID  Simethicone QID  PRN Phenergan   Protonix Daily  PRN Tucks, Anusol for hemorrhoids    VOD/SOS  Moderate risk with ablative BU/Flu  Ursodiol TID  Hyperbilirubinemia- bilirubin stable at 1.7 today    Mucositis  WHO Grade 2 - improving   Oxycodone PRN  Cont good oral care, normal saline rinses  PRN BMX      At risk for deconditioning   PT following  Encouraged ambulation    Psychosocial  Cont emotional support     Smoking Cessation  Offered nicotine replacement & [place] [facility]oking Cessation [facility]; patient declining at this time    Dispo planning  Anticipate discharge following engraftment and clinical stability  BMT consent signed  BMT attending [name]  Primary care physician [name]  RN coordinator Anne/Sharon  Lives in [place]    Patient seen and discussed with Dr. Levonne Lapping, NP    Lab Data      Lab results: 06/03/15  0020 06/02/15  0235 06/01/15  0370  05/14/15  0106 05/13/15  0107 05/12/15  0527 05/11/15  1649   WBC 5.8 4.7 4.4  < > 35.4* 44.3* 56.1* 43.7*   Hemoglobin 8.1* 7.8* 7.6*  < > 7.9* 6.5* 7.2* 7.1*   Hematocrit 24* 23* 23*  < > 25* 21* 23* 22*   RBC 2.9* 2.8* 2.7*  < > 2.7* 2.2* 2.5* 2.4*   Platelets 141* 125* 102*  < > 47* 54* 62* 59*   Neut # K/uL 3.8 3.2 2.6  < > 29.0* 32.8* 38.7* 27.1*   Lymph #  K/uL 0.4* 0.1* 0.2*  < > 1.1* 2.2 4.5* 2.6   Mono  # K/uL 0.7 0.5 0.9  < > 0.3 0.0* 1.4* 0.4   Eos # K/uL 0.0 0.0 0.0  < > 0.0 0.4 0.0 0.0   Baso # K/uL 0.1 0.0 0.0  < > 0.0 0.0 0.0 0.0   Seg Neut % 57.3 67.3 52.1  < > 71.9 56.0 49.3 53.5   Lymphocyte % 6.0 2.6 5.2  < > 2.6 5.3 7.6 6.1   Monocyte % 12.8 10.3 20.9  < > 0.9 0.0 2.5 0.9   Eosinophil % 0.0 0.9 0.0  < > 0.0 0.9 0.0 0.0   Basophil % 1.7 0.0 0.0  < > 0.0 0.0 0.0 0.0   Bands % _0 < > 10 18* 20* 8   Myelocyte % 3* 9* 6*  < > 9* 4* 2* 12*   Metamyelocyte % 9* 6* 9*  < > 4* 13* 17* 12*   Promyelocyte %  --  3*  --   --  2* 1*  --  4*   Blasts %  --   --   --   --   --  2* 2* 3*   < > = values in this interval not displayed.            Lab results: 06/03/15  0020 06/02/15  0235 06/01/15  0117   Sodium 142 138 135   Potassium 3.7 3.9 3.9   Chloride 103 103 101   CO2 _1 UN 14 23* 34*   Creatinine 0.64 0.72 0.81   GFR,Caucasian 101 95 82   GFR,Black 116 109 95   Glucose 148* 149* 162*   Calcium 8.6 8.6 8.3*           Lab results: 06/03/15  0020   Magnesium 1.1*       Lab Results   Component Value Date    ALT 25 06/03/2015    AST 17 06/03/2015     Lab Results   Component Value Date    ALK 59 06/03/2015     Lab Results   Component Value Date    TB 1.7 (H) 06/03/2015     LD   Date Value Ref Range Status   06/03/2015 228 (H) 118 - 225 U/L Final     LD, PL   Date Value Ref Range Status   05/09/2015 1146 (H) 118 - 225 U/L Final     LDL Calculated   Date Value Ref Range Status   08/27/2013 113 mg/dL Final     Comment:     REFERENCE RANGE:  < 100 Optimal                  100-129 Near or above optimal                  130-159 Borderline High                  160-189 High                    > 189 Very High

## 2015-06-03 NOTE — Plan of Care (Addendum)
Comfort - BMT-related     Nausea controlled or eliminated Maintaining     Diarrhea controlled or eliminated Maintaining     Without infection Maintaining        Graft Versus Host Disease (GVHD) Bundle     Without rash Maintaining     Stool output within parameters (</= 576ml in 24 h) Maintaining     Total bilirubin </= 2 Maintaining        Mobility     Patient's functional status is maintained or improved Maintaining        Nutrition     Patient's nutritional status is maintained or improved Maintaining        Pain/Comfort     Patient's pain or discomfort is manageable Maintaining        Psychosocial     Demonstrates ability to cope with illness Maintaining        Safety     Patient will remain free of falls Maintaining            Patient tachycardic to 130's, provider notified, EKG obtained.  Patient in AFIB, 5mg  IV lopressor administrated by provider at bedside.  Vitals obtained before and 15 minutes after administration.  Positive effect thus far.  Patient placed on telemetry.  Continuing to monitor.      Update:  0500:  HR 140's, 5mg  Lopressor IV by provider.  Monitoring effect.  Ativan x1 for anxiety.

## 2015-06-03 NOTE — Progress Notes (Addendum)
Patient was reported to have anxiety with heart rate in 130s. She states she has been having a dull ache in her chest since this afternoon at a 3-4/10 which is only worsened by taking in deep breaths. She states she has been trying to catch her breath for several days now. She just started having this chest ache this afternoon. She denies any anginal equivalents such as sweating, arm pain, jaw pain. She only becomes lightheaded when move around too quickly. She states she has had anxiety attacks before but she cannot state with certainty that his is similar. She states she just wants to sleep tonight. She states she is bloated.       Vitals:    06/03/15 1949   BP: 122/67   Pulse: (!) 131   Resp: 18   Temp: 36.8 C (98.2 F)   Weight:    Height:      EKG showing HR 130 with Afib with RVR. No significant ST changes or T wave abnormalities.   General:  Lying in bed. NAD. AO.  HEENT: Normocephalic. Atraumatic. PERRL. MMM. Supple Neck.   Cardiovascular: tachycardic with irregularly irregular rhythm. Normal S1, S2. No murmurs, rubs or gallops.   Pulmonary: CTAB although poor inspiratory effort. No crackles, rales, or ronchi appreciable.   Abdominal: Normal Bowel Sounds. Soft. Nontender. Nondistended. No organomegaly or masses.   Neurological: Grossly intact. No focal neurological deficits.    Extremities: 2+ pedal pulses bilaterally. No edema.   Skin: Warm and dry.       A/P:   Carly Higgins is a 55 yo female with unclassifiable myeloproliferative neoplasm. By [employer] testing, she has mutations of TET2, ASXL1, CCND2, GATA2, SRSF2. Admitted for MUD allogeneic PBSCT. She is now tachycardic with anxiety and a dull chest ache worse with inspiration and present since this afternoon.     DDx: afib with RVR vs. Anxiety vs. Pleuritic chest pain from pleural effusions seen on CXR vs. PE (unlikely given no hypoxia and she is on DVT prophylaxis.     Afib with RVR:   - she has already received her diltiazem 180 mg BID  today  - she is due for her 9 PM 50 mg metoprolol BID   - will given 5 mg IV metoprolol x1 and reassess HR; if <110s, will then give evening 50 mg metoprolol   - will recheck electrolytes     Signed by Marquis Lunch, MD as of: 06/03/2015  at: 8:22 PM  Internal Medicine PGY-3  Pager: [phone]     Update:     Patient still with increased heart rate.     HR in 130s on telemetry. BP ~130/70  Metoprolol 5 mg IV pushed over a minute with good effect.   Post metoprolol vitals   Vitals:    06/03/15 1949   BP: 122/67   Pulse: (!) 131   Resp: 18   Temp: 36.8 C (98.2 F)   Weight:    Height:      Will give regularly scheduled metoprolol 50 mg at 9 pm.     Signed by Marquis Lunch, MD as of: 06/03/2015  at: 8:50 PM  Internal Medicine PGY-3  Pager: [phone]

## 2015-06-03 NOTE — Interdisciplinary Rounds (Signed)
Interdisciplinary Rounds Note    Date: 06/03/2015   Time: 7:30 AM   Attendance:  Attending, PA, Charge RN    Admit Date/Time:  05/11/2015  2:02 PM    Principal Problem: <principal problem not specified>  Problem List:   Patient Active Problem List    Diagnosis Date Noted    Myeloproliferative disease 05/11/2015    MPN (myeloproliferative neoplasm) 01/19/2014    Leukocytosis 09/22/2013       The patient's problem list and interdisciplinary care plan was reviewed.    Discharge Planning                 *Does patient currently have home care services?: No     *Current External Services: None                      Plan    05/14/15 Day -3.  Will receive Flu/Bu tonight.  Pt tolerating chemo well.  Continue supportive care.   5/7: doing well, continue supportive care  5/8:  Day -1.  Mild nausea, fatigue.  Continue supportive care.  05/17/2015 Day 0, WBC 3.3, ANC 3.2, Hct 23, plts 36, will receive MUD PBSCs today, continue supportive care.  05/18/2015 Day +1 WBC 3.9, ANC 3.7, hct 21, plts 52K, received MUD PBSCs yesterday, tolerated well, MTX given per protocol, continue supportive care.  05/19/15:  Day +2.  Counts dropping with ANC 2600 today.  Glucose control with BG's and SS insulin coverage d/t steroids.  Continue supportive care.  05/20/15 D +3  Labs stable/falling. Multiple episodes of vomiting yesterday, abd discomfort.   Continue supportive care.  5/13: Day +4. BMX for mucositis. Dizzy- 500 ml bolus and add daily prn bolus. Helmet/assist out of bed.  05/22/15: Day +5 today, c/o rectal pain after having diarrhea, given topicals to alleviate burning discomfort, feeling better today. Continue supportive care.  05/23/15:  Day +6.  Mild complaints of nausea and diarrhea.  Check stool for C-Diff and if negative will start imodium.  Complaining of rectal pain - adjust medications to control discomfort.  Due fpr MTX today.  ANC 700,  Plt 7 K and hct 19 - transfused with both products today. Continue supportive care.  05/24/2015 Day  +7 WBC 0.6, ANC 0.4, hct 17, plts 9K, transfused with plts, will receive PRBCs, complaining of mucusitis pain, not eating, encouraged pt to take pain medication, also needs encouragement to get OOB, otherwise continue supportive care.  05/25/2015 Day +8 WBC 0.2, ANC 0.1, hct 20, plts 15K, complaining of increased pain for which the dilaudid helps but does not last per pt, starting PCA, continue other supportive measures.  05/26/15:  Day +9, nadired.  Receiving blood and plts today.  Will stop BG's and SS insulin coverage.  Encourage patient increase activity.  Continue supportive care.  05/27/15: Day +10.  Nadired, plts 10K and hct 19.  T.bili today 6.1.  Ursodiol increased to tid.  U/S of liver today.  Check PT/PTT.  Mucositis worse, tongue very swollen.  Continue IVF to hydrate.  Change diflucan to caspofungin as may contribute to increasing T.bili further.  Change dilaudid PCA to Morphine d/t persistent nausea. Continue supportive care.  05/28/15: Day +11 T-bili still increased at 5.4. No MTX today. Tachy to 150s this AM, bolus given. Still having persistent nausea, cont with antiemetics. Continue supportive care.  05/29/15: Day +12. HR still in the 150s, ANC 1100 today. CT angio done last night and ECHO to be done today. Decreasing  fluids to 68ml/hr. Pt states feeling better this morning. D/C scopolamine patch d/t urinary retention. Continue supportive care.   05/30/15:  Day +13.  Engrafting, WBC 3.0, ANC 1700.  T/bili down to 3.5.  Pt complains of SOB, increased pain in abdomen and throat.  HR continues to be sustained and elevated despite metoprolol.  Cardiology consult today, increase metoprolol to 25 mg q 6 hours with parameters.  Check KUB/CT abdomen.  Add empiric azithromycin. Change Morphine PCA to dilaudid for better pain control.  Had foley in over weekend and was d/c'ed at pt request - will recheck post void residual.  Continue supportive care.  06/01/2015 Day +15 WBC 4.4, ANC 2.6, hct 23, plts 102K, changed  from IV to po cardizem yesterday, sinus tacy 110's, will d/c tele today, continues on PCA with continued complaints of mucusitis pain, continues with edema and c/o SOB, will give lasix, 900 stool yesterday, thick but not watery, continue supportive care.  06/02/15:  Day +16.  Counts recovering.  Diarrhea resolving - will change imodium to prn.  Change tacro to po, d/c continuous PCA.  Have evaluate and treat.  Encourage po intake and try to get ready to discharge to home this week.  Continue supportive care.  06/03/15:  Day +17. Count recovery continues.  T.Bili 1.7 today.  Remains on oxygen for comfort, SOB on exertion.  Wt unchanged from yesterday, will give lasix today and wean oxygen.  Check chest xray.  Change caspofungin to diflucan.  Will remain on cefepime and flagyl till Monday to finish course for cholecystitis.  Continue supportive care.            Anticipated Discharge Date:  Discharge Disposition: Home, choiced to [facility]

## 2015-06-03 NOTE — Plan of Care (Signed)
Comfort - BMT-related    • Nausea controlled or eliminated Maintaining    • Diarrhea controlled or eliminated Maintaining    • Without infection Maintaining        Graft Versus Host Disease (GVHD) Bundle    • Without rash Maintaining    • Stool output within parameters (</= 500ml in 24 h) Maintaining    • Total bilirubin </= 2 Maintaining        Pain/Comfort    • Patient's pain or discomfort is manageable Maintaining        Psychosocial    • Demonstrates ability to cope with illness Maintaining        Safety    • Patient will remain free of falls Maintaining          Mobility    • Patient's functional status is maintained or improved Progressing towards goal        Nutrition    • Patient's nutritional status is maintained or improved Progressing towards goal

## 2015-06-04 LAB — DIFF MANUAL
Bands %: 3 % (ref 0–10)
Diff Based On: 116 CELLS
Metamyelocyte %: 5 % — ABNORMAL HIGH (ref 0–1)
Myelocyte %: 3 % — ABNORMAL HIGH (ref 0–0)
React Lymph %: 1 % (ref 0–6)

## 2015-06-04 LAB — COMPREHENSIVE METABOLIC PANEL
ALT: 20 U/L (ref 0–35)
AST: 14 U/L (ref 0–35)
Albumin: 3.3 g/dL — ABNORMAL LOW (ref 3.5–5.2)
Alk Phos: 56 U/L (ref 35–105)
Anion Gap: 14 (ref 7–16)
Bilirubin,Total: 1.8 mg/dL — ABNORMAL HIGH (ref 0.0–1.2)
CO2: 30 mmol/L — ABNORMAL HIGH (ref 20–28)
Calcium: 8.5 mg/dL — ABNORMAL LOW (ref 8.6–10.2)
Chloride: 97 mmol/L (ref 96–108)
Creatinine: 0.61 mg/dL (ref 0.51–0.95)
GFR,Black: 118 *
GFR,Caucasian: 102 *
Glucose: 160 mg/dL — ABNORMAL HIGH (ref 60–99)
Lab: 7 mg/dL (ref 6–20)
Potassium: 3.4 mmol/L (ref 3.3–5.1)
Sodium: 141 mmol/L (ref 133–145)
Total Protein: 5.2 g/dL — ABNORMAL LOW (ref 6.3–7.7)

## 2015-06-04 LAB — MAGNESIUM
Magnesium: 1 mEq/L — ABNORMAL LOW (ref 1.3–2.1)
Magnesium: 1.1 mEq/L — ABNORMAL LOW (ref 1.3–2.1)

## 2015-06-04 LAB — CBC AND DIFFERENTIAL
Baso # K/uL: 0 10*3/uL (ref 0.0–0.1)
Basophil %: 0 %
Eos # K/uL: 0.1 10*3/uL (ref 0.0–0.4)
Eosinophil %: 0.9 %
Hematocrit: 25 % — ABNORMAL LOW (ref 34–45)
Hemoglobin: 8.5 g/dL — ABNORMAL LOW (ref 11.2–15.7)
Lymph # K/uL: 0.3 10*3/uL — ABNORMAL LOW (ref 1.2–3.7)
Lymphocyte %: 3.4 %
MCH: 28 pg/cell (ref 26–32)
MCHC: 34 g/dL (ref 32–36)
MCV: 83 fL (ref 79–95)
Mono # K/uL: 0.9 10*3/uL (ref 0.2–0.9)
Monocyte %: 11.2 %
Neut # K/uL: 6.2 10*3/uL — ABNORMAL HIGH (ref 1.6–6.1)
Nucl RBC # K/uL: 0.2 10*3/uL — ABNORMAL HIGH (ref 0.0–0.0)
Nucl RBC %: 2.7 /100 WBC — ABNORMAL HIGH (ref 0.0–0.2)
Platelets: 172 10*3/uL (ref 160–370)
RBC: 3.1 MIL/uL — ABNORMAL LOW (ref 3.9–5.2)
RDW: 18.6 % — ABNORMAL HIGH (ref 11.7–14.4)
Seg Neut %: 73.2 %
WBC: 8.2 10*3/uL (ref 4.0–10.0)

## 2015-06-04 LAB — BASIC METABOLIC PANEL
Anion Gap: 16 (ref 7–16)
CO2: 27 mmol/L (ref 20–28)
Calcium: 8.2 mg/dL — ABNORMAL LOW (ref 8.6–10.2)
Chloride: 93 mmol/L — ABNORMAL LOW (ref 96–108)
Creatinine: 0.61 mg/dL (ref 0.51–0.95)
GFR,Black: 118 *
GFR,Caucasian: 102 *
Glucose: 212 mg/dL — ABNORMAL HIGH (ref 60–99)
Lab: 9 mg/dL (ref 6–20)
Potassium: 3.5 mmol/L (ref 3.3–5.1)
Sodium: 136 mmol/L (ref 133–145)

## 2015-06-04 LAB — TACROLIMUS LEVEL: Tacrolimus: 8.9 ng/mL

## 2015-06-04 MED ORDER — DILTIAZEM HCL CR 60 MG PO CP12 *I*
180.0000 mg | ORAL_CAPSULE | Freq: Every morning | ORAL | Status: DC
Start: 2015-06-05 — End: 2015-06-04
  Filled 2015-06-04: qty 1

## 2015-06-04 MED ORDER — DILTIAZEM HCL 60 MG PO TABS *I*
60.0000 mg | ORAL_TABLET | Freq: Once | ORAL | Status: AC
Start: 2015-06-04 — End: 2015-06-04
  Administered 2015-06-04: 60 mg via ORAL
  Filled 2015-06-04 (×2): qty 1

## 2015-06-04 MED ORDER — METOPROLOL TARTRATE 1 MG/ML IV SOLN *I*
5.0000 mg | Freq: Once | INTRAVENOUS | Status: AC
Start: 2015-06-04 — End: 2015-06-04
  Administered 2015-06-04: 5 mg via INTRAVENOUS
  Filled 2015-06-04: qty 5

## 2015-06-04 MED ORDER — DILTIAZEM HCL CR 120 MG PO CP12 *I*
240.0000 mg | ORAL_CAPSULE | Freq: Two times a day (BID) | ORAL | Status: DC
Start: 2015-06-04 — End: 2015-06-07
  Administered 2015-06-04 – 2015-06-07 (×6): 240 mg via ORAL
  Filled 2015-06-04 (×8): qty 2

## 2015-06-04 MED ORDER — DILTIAZEM HCL CR 120 MG PO CP12 *I*
240.0000 mg | ORAL_CAPSULE | Freq: Every evening | ORAL | Status: DC
Start: 2015-06-04 — End: 2015-06-04
  Filled 2015-06-04: qty 2

## 2015-06-04 MED ORDER — LORAZEPAM 2 MG/ML IJ SOLN *I*
0.5000 mg | Freq: Once | INTRAMUSCULAR | Status: AC
Start: 2015-06-04 — End: 2015-06-04
  Administered 2015-06-04: 0.5 mg via INTRAVENOUS
  Filled 2015-06-04: qty 1

## 2015-06-04 MED ORDER — DILTIAZEM HCL 5 MG/ML IV SOLN *WRAPPED*
20.0000 mg | Freq: Once | INTRAVENOUS | Status: AC
Start: 2015-06-04 — End: 2015-06-04
  Administered 2015-06-04: 20 mg via INTRAVENOUS
  Filled 2015-06-04 (×2): qty 5

## 2015-06-04 MED ORDER — POTASSIUM CHLORIDE 20 MEQ/50ML IV SOLN *I*
20.0000 meq | Freq: Once | INTRAVENOUS | Status: AC
Start: 2015-06-04 — End: 2015-06-04
  Administered 2015-06-04: 20 meq via INTRAVENOUS
  Filled 2015-06-04: qty 50

## 2015-06-04 MED ORDER — FUROSEMIDE 10 MG/ML IJ SOLN *I*
40.0000 mg | Freq: Once | INTRAMUSCULAR | Status: AC
Start: 2015-06-04 — End: 2015-06-04
  Administered 2015-06-04: 40 mg via INTRAVENOUS
  Filled 2015-06-04: qty 4

## 2015-06-04 MED ORDER — METOPROLOL TARTRATE 50 MG PO TABS *I*
50.0000 mg | ORAL_TABLET | Freq: Once | ORAL | Status: AC
Start: 2015-06-04 — End: 2015-06-04
  Filled 2015-06-04: qty 1

## 2015-06-04 MED ORDER — METOPROLOL TARTRATE 50 MG PO TABS *I*
50.0000 mg | ORAL_TABLET | Freq: Two times a day (BID) | ORAL | Status: DC
Start: 2015-06-04 — End: 2015-06-07
  Administered 2015-06-04 – 2015-06-07 (×7): 50 mg via ORAL
  Filled 2015-06-04 (×9): qty 1

## 2015-06-04 MED ORDER — MAGNESIUM SULFATE 2 G IN NS 50 ML *I*
2000.0000 mg | INTRAVENOUS | Status: AC
Start: 2015-06-04 — End: 2015-06-04
  Administered 2015-06-04 (×2): 2000 mg via INTRAVENOUS
  Filled 2015-06-04: qty 50

## 2015-06-04 MED ORDER — CALCIUM CARBONATE ANTACID 500 MG PO CHEW *I*
500.0000 mg | CHEWABLE_TABLET | Freq: Once | ORAL | Status: AC | PRN
Start: 2015-06-04 — End: 2015-06-04
  Administered 2015-06-04: 500 mg via ORAL
  Filled 2015-06-04: qty 1

## 2015-06-04 MED ORDER — MAGNESIUM SULFATE 2 G IN NS 50 ML *I*
2000.0000 mg | Freq: Once | INTRAVENOUS | Status: AC
Start: 2015-06-04 — End: 2015-06-04
  Administered 2015-06-04: 2000 mg via INTRAVENOUS
  Filled 2015-06-04: qty 50

## 2015-06-04 NOTE — Progress Notes (Signed)
Comfort - BMT-related     Nausea controlled or eliminated Maintaining     Diarrhea controlled or eliminated Maintaining     Without infection Maintaining        Graft Versus Host Disease (GVHD) Bundle     Without rash Maintaining     Stool output within parameters (</= 556ml in 24 h) Maintaining     Total bilirubin </= 2 Maintaining        Mobility     Patient's functional status is maintained or improved Maintaining        Nutrition     Patient's nutritional status is maintained or improved Maintaining        Pain/Comfort     Patient's pain or discomfort is manageable Maintaining        Psychosocial     Demonstrates ability to cope with illness Maintaining        Safety     Patient will remain free of falls Maintaining          (1900-0700) Pt. AVSS on room air, A&O x3. Pt received PRN PO tylenol x1 for back pain, with positive effect. Fall prevention teaching reinforced with patient, patient verbalized understanding.  Will continue to monitor and provide handoff to oncoming nurse for continuity of care.

## 2015-06-04 NOTE — Progress Notes (Addendum)
Blood & [facility]row Transplant Program APP Progress Note    CC   Carly Higgins is a 55 y.o. 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 By [employer] testing, she has mutations of TET2, ASXL1, CCND2, GATA2, SRSF2.     Admitted for MUD allogeneic PBSC transplant.     Length of stay  LOS: 24 days     Interval History   Overnight patient with tachycardia from 120s-150s, overnight resident to bedside and administered metoprolol 81m IV x 2 (at 2043 and 0455) with some immediate response but no lasting effect.  Continues with tachycardia to 150s this morning; repeated metoprolol 558mIV x 1 at 1010 with no lasting effect.  Cardiology reconsulted, per recommendations pushed diltiazem 2075mV x 1 followed by extra 41m66m diltiazem.  Also per recommendations will increase evening dose diltiazem.        Patient reports shortness of breath, fatigue, abdominal bloating.  Lasix 40mg84mx 1 today, per Cardiology recommendations will hold off on further diuresis until tomorrow.     Review of Systems   A comprehensive review of 13 systems was performed; pertinent positives checked below:  _0  fevers  _1  fast pulse _2  fatigue   _3  chills _4  chest pain _5  dysuria    _6  dry eyes _7  nausea/emesis  _8  rash   _9  ear pain _10  abdominal cramping _11  skin changes   _12  sores in mouth _13  abdominal discomfort _14  pain   _15  sore throat _16  loose stool, non-formed _17  none of the above   _18  cough  _19  diarrhea, watery    _20  sputum production  _21  weight loss    _22  shortness of breath _23  insufficient oral intake      Vital Signs and I&O   Temp:  [36.3 C (97.3 F)-37.4 C (99.3 F)] 36.4 C (97.5 F)  Heart Rate:  [98-155] 107  Resp:  [18-36] 32  BP: (100-146)/(66-93) 100/67    Intake/Output Summary (Last 24 hours) at 06/04/15 1401  Last data filed at 06/04/15 0959 [id number] per 24 hour   Intake             3320 ml   Output             3550 ml   Net             -230 ml        Physical Exam      Head:  Normocephalic, atraumatic   Eyes:  Conjunctiva/corneas clear, icterus noted    Throat: Oral mucosa pink & moist; healing lesions    Lungs:   Diffuse wheezing B/L, tachypneic, increased WOB   Heart:  Tachycardic, irregularly irregular rhythm   Abdomen:   Distention, nontender, BS present   Extremities: No edema   Pulses: 2+ and symmetric   Skin: No rashes or lesions   Neurologic: A&O X 3, CMS intact   Central venous access: IVAD - C/D/I      Assessment & Plan   Carly Higgins 54 yo37emale with unclassifiable myeloproliferative neoplasm.  By [employer], she has mutations of TET2, ASXL1, CCND2, GATA2, SRSF2. Admitted for MUD allogeneic PBSCT.     Atypical MPN  Completed Decitabine 20mg/55mV daily x 5 days - Cycle # 1 12/15/14  On intermittent/dose adjusted hydroxyurea at admission - discontinued 5/5 in setting of falling WBC     Day -5 thru -2 (5/4-5/7):  Flu 40mg/m58mu  149m/m2 - Busulfan dose adjusted to 2847mfor last 2 doses (from 24436m Day -1 (5/8): Rest day  Day 0 (5/9): MUD (10/10 HLA, female) allo PBSCT, ABO: A+/A+, CMV: neg/neg     Today is BMT day +17    Pancytopenia  Transfuse 1 unit RBCs for hematocrit <21% and/or transfuse 1 unit platelets for platelet count <11,000 OR transfuse 1 unit platelets for platelet count <20,000 if bleeding or fever.  Helmet for safety during periods of thrombocytopenia, per hospital policy    DVT ppx  Enoxaparin - hold for plts <30K     ID  Cholecystitis on abd [place]19  T bili trending down, now stabilized   Cipro/Flagyl started - Cipro stopped 5/20   Cefepime (5/20 to date), Flagyl (5/19 to date)  Planing for 10 day course of Cefepime/flagyl    PNA on CTA   Tachycardic 5/20 - Cipro stopped, vancomycin 2g x 1 dose, started cefepime, cont Flagyl; CXR pulm edema vs infection   Hypoxic 5/21 - negative for PE, likely PNA    Azithromycin added 5/22 for atypical coverage - now complete    Viral prophylaxis with acyclovir  Fungal prophylaxis with  fluconazole    A-fib with RVR  Cardiology consulted, started on dilt gtts and converted to PO with good effect, now with recurrence   Metoprolol 5mg48m x 3 overnight/this AM with no effect   Reconsulted Cardio:  -BMPs BID to keep Mg >2, K >4  -Gave dilt 20mg68mx 1 push, followed by extra 60mg 42m-Cont diltiazem 180mg e77m morning, increase nightly dose to 240mg  -36m metoprolol 50mg BID8mf no improvement with increased diltiazem dose, may start digoxin tomorrow   -Recommend holding off on further diuresis today, repeating Lasix 40mg IV t30mrow if needed     Pulm  Weaning O2 as tolerated, keeping saturation above 90%  Repeat CXR 5/27 without significant change after diuresis  40mg Lasix16m today  Albuterol, Duonebs ATC wheezing     GVHD  Planned GVHD prophylaxis with tacrolimus and methotrexate  Target tacrolimus level 8-12, now on oral tacro  full dose MTX given day 1,3,6  Day 11 MTX held due to T biliof 5.4, mucositis     Renal  Creatinine at admission was 0.7, stable today at 0.64  Encouraged PO intake     Electrolyte imbalance  Replace electrolytes as to the following parameters  Hypokalemia, supplement when serum K <3.6  Hypomagnesemia, supplement when serum Mag <1.5, supplmeneted  Hypocalcemia, supplement when corrected calcium <8.9    At risk for malnutrition/hypoalbuminemia  Regular diet  Albumin level on admission 4.3  Nutrition following     GI  Zofran TID  Simethicone QID  PRN Phenergan   Protonix Daily  PRN Tucks, Anusol for hemorrhoids    VOD/SOS  Moderate risk with ablative BU/Flu  Ursodiol TID  Hyperbilirubinemia - stable      Mucositis  WHO Grade 2 - improving   Oxycodone PRN  Cont good oral care, normal saline rinses  PRN BMX    At risk for deconditioning   PT following  Encouraged ambulation    Psychosocial  Cont emotional support     Smoking Cessation  Offered nicotine replacement & [facility]; patient declining at this time    Dispo planning  Anticipate  discharge following engraftment and clinical stability  BMT consent signed  BMT attending Dr. Liesveld  [place] physician Dr. CahnhidalgoLenn Calnator  Anne/Sharon  Lives in [place]    Patient seen and discussed with Dr. Marcos Eke, PA    Lab Data      Lab results: 06/04/15  0104 06/03/15  0020 06/02/15  4098  05/14/15  0106 05/13/15  0107 05/12/15  0527 05/11/15  1649   WBC 8.2 5.8 4.7  < > 35.4* 44.3* 56.1* 43.7*   Hemoglobin 8.5* 8.1* 7.8*  < > 7.9* 6.5* 7.2* 7.1*   Hematocrit 25* 24* 23*  < > 25* 21* 23* 22*   RBC 3.1* 2.9* 2.8*  < > 2.7* 2.2* 2.5* 2.4*   Platelets 172 141* 125*  < > 47* 54* 62* 59*   Neut # K/uL 6.2* 3.8 3.2  < > 29.0* 32.8* 38.7* 27.1*   Lymph # K/uL 0.3* 0.4* 0.1*  < > 1.1* 2.2 4.5* 2.6   Mono # K/uL 0.9 0.7 0.5  < > 0.3 0.0* 1.4* 0.4   Eos # K/uL 0.1 0.0 0.0  < > 0.0 0.4 0.0 0.0   Baso # K/uL 0.0 0.1 0.0  < > 0.0 0.0 0.0 0.0   Seg Neut % 73.2 57.3 67.3  < > 71.9 56.0 49.3 53.5   Lymphocyte % 3.4 6.0 2.6  < > 2.6 5.3 7.6 6.1   Monocyte % 11.2 12.8 10.3  < > 0.9 0.0 2.5 0.9   Eosinophil % 0.9 0.0 0.9  < > 0.0 0.9 0.0 0.0   Basophil % 0.0 1.7 0.0  < > 0.0 0.0 0.0 0.0   Bands % _0 < > 10 18* 20* 8   Myelocyte % 3* 3* 9*  < > 9* 4* 2* 12*   Metamyelocyte % 5* 9* 6*  < > 4* 13* 17* 12*   Promyelocyte %  --   --  3*  --  2* 1*  --  4*   Blasts %  --   --   --   --   --  2* 2* 3*   < > = values in this interval not displayed.            Lab results: 06/04/15  0104 06/03/15  0020 06/02/15  0235   Sodium 141 142 138   Potassium 3.4 3.7 3.9   Chloride 97 103 103   CO2 30* 23 22   UN 7 14 23*   Creatinine 0.61 0.64 0.72   GFR,Caucasian 102 101 95   GFR,Black 118 116 109   Glucose 160* 148* 149*   Calcium 8.5* 8.6 8.6           Lab results: 06/04/15  0104   Magnesium 1.0*       Lab Results   Component Value Date    ALT 20 06/04/2015    AST 14 06/04/2015     Lab Results   Component Value Date    ALK 56 06/04/2015     Lab Results   Component Value Date    TB 1.8 (H)  06/04/2015     LD   Date Value Ref Range Status   06/03/2015 228 (H) 118 - 225 U/L Final     LD, PL   Date Value Ref Range Status   05/09/2015 1146 (H) 118 - 225 U/L Final     LDL Calculated   Date Value Ref Range Status   08/27/2013 113 mg/dL Final     Comment:     REFERENCE RANGE:  <  100 Optimal                  100-129 Near or above optimal                  130-159 Borderline High                  160-189 High                    > 189 Very High

## 2015-06-04 NOTE — Progress Notes (Addendum)
Cardiology Consult Note    Date of Consult: 06/04/2015    Name: Carly Higgins Attending: Assunta Curtis, MD   DOB: [birth year] PCP: Benedict Needy, MD   MRN: [id number] Primary Cardiologist: None   CSN: [id number] Admit Date:  05/11/2015     History of Present Illness     I had the pleasure of seeing Carly Higgins in cardiology follow-up on 06/04/2015. Julius is an 55 y.o. female who we were asked to see for recurrent tachycardia. [name] was seen by cardiology 5/22 and 5/23 with poorly controlled a-flutter. She was started on a diltiazem gtt and then transitioned to PO diltiazem and metoprolol with good result. Her heart rate remained well controlled until yesterday evening when she again developed palpitations and her heart rate on telemetry was persistently in the 150s. The primary oncology team gave 5mg  IV metoprolol x2 doses at wide intervals without improvement. She then received 20mg  IV diltiazem per my verbal recommendation. She also received 40mg  IV furosemide    Past Medical and Surgical History     Past Medical History:   Diagnosis Date    GERD (gastroesophageal reflux disease)     Shortness of breath      Past Surgical History:   Procedure Laterality Date    ROTATOR CUFF REPAIR Right        Medications and Allergies     Prescriptions Prior to Admission   Medication Sig    Meds & Orders Exist in Blood Admin Plan     traMADol (ULTRAM) 50 MG tablet     naproxen (NAPROSYN) 500 MG tablet Take 500 mg by mouth 2 times daily (with meals)    hydroxyurea (HYDREA) 500 MG capsule Take 1 capsule (500 mg total) by mouth daily    cyclobenzaprine (FLEXERIL) 10 MG tablet Take 10 mg by mouth 3 times daily as needed for Muscle spasms    prochlorperazine (COMPAZINE) 10 MG tablet Take 1 tablet (10 mg total) by mouth 4 times daily as needed for Nausea    escitalopram (LEXAPRO) 10 MG tablet Take 15 mg by mouth daily    allopurinol (ZYLOPRIM) 300 MG tablet Take 1 tablet (300 mg total) by mouth daily     Current  Facility-Administered Medications   Medication Dose Route Frequency    metoprolol  50 mg Oral 2 times per day    metoprolol  50 mg Oral Once    acyclovir  400 mg Oral 2 times per day    fluconazole  200 mg Oral Nightly    tacrolimus  3 mg Oral 2 times per day    calcium carbonate  1,000 mg Oral 2 times per day    ondansetron  4 mg Oral TID AC    pantoprazole  40 mg Oral QAM    ursodiol  300 mg Oral TID    diltiazem  180 mg Oral 2 times per day    enoxaparin  40 mg Subcutaneous Daily    ipratropium-albuterol  3 mL Nebulization 4x Daily    simethicone  80 mg Oral 4x Daily    ceFEPime (MAXIPIME) IV  2 g Intravenous Q8H    metroNIDAZOLE  500 mg Intravenous Q8H    nicotine  1 patch Transdermal Daily    magnesium sulfate  2,000 mg Intravenous Daily    potassium chloride  20 mEq Intravenous Daily    escitalopram  15 mg Oral Daily     She has No Known Allergies (drug, envir, food or  latex).    Social and Family History     Family History   Problem Relation Age of Onset    No Known Problems Mother     No Known Problems Father      Social History     Social History    Marital status: Divorced     Spouse name: N/A    Number of children: N/A    Years of education: N/A     Occupational History    Not on file.     Social History Main Topics    Smoking status: Current Every Day Smoker     Packs/day: 0.50     Years: 30.00    Smokeless tobacco: Not on file    Alcohol use Yes      Comment: occassional    Drug use: No    Sexual activity: Not on file     Social History Narrative         Review of Systems     Review of Systems   Cardiovascular: Positive for palpitations. Negative for chest pain and orthopnea.   Musculoskeletal: Negative for falls.   Neurological: Negative for loss of consciousness.     Vitals and Physical Exam     Eulalah's  height is 1.676 m (5\' 6" ) and weight is 126 kg (277 lb 12.5 oz). Her temporal temperature is 36.4 C (97.5 F). Her blood pressure is 100/67 and her pulse is 107. Her  respiration is 32 (abnormal) and oxygen saturation is 91%.  Body mass index is 44.83 kg/(m^2).  I/O last 3 completed shifts:  05/26 0700 - 05/27 0659  In: 2585 (20.5 mL/kg) [P.O.:1980; IV Piggyback:605]  Out: 4825 (38.3 mL/kg) [Urine:4825 (1.6 mL/kg/hr)]  Net: -2240  Weight: 126 kg   Physical Exam   General: Pleasant. Alert/interactive. NAD.  HEENT: MMM  Cor: regular tachycardia  Pulm: Normal WOB, CTA-B  Ext: WWP, 2+ peripheral pulses  Neuro: A&O to conversation      Laboratory Data     Hematology:   Results in Past 365 Days  Result Component Current Result Previous Result   WBC 8.2 (06/04/2015) 5.8 (06/03/2015)   Hemoglobin 8.5 (L) (06/04/2015) 8.1 (L) (06/03/2015)   Hematocrit 25 (L) (06/04/2015) 24 (L) (06/03/2015)   Platelets 172 (06/04/2015) 141 (L) (06/03/2015)     Chemistry:   Results in Past 365 Days  Result Component Current Result Previous Result   Sodium 141 (06/04/2015) 142 (06/03/2015)   Potassium 3.4 (06/04/2015) 3.7 (06/03/2015)   Creatinine 0.61 (06/04/2015) 0.64 (06/03/2015)   Glucose 160 (H) (06/04/2015) 148 (H) (06/03/2015)   Calcium 8.5 (L) (06/04/2015) 8.6 (06/03/2015)   AST 14 (06/04/2015) 17 (06/03/2015)   ALT 20 (06/04/2015) 25 (06/03/2015)   TSH 3.63 (12/31/2014) Not in Time Range     Coagulation Studies:   Results in Past 365 Days  Result Component Current Result Previous Result   aPTT 26.5 (05/11/2015) 23.8 (L) (05/03/2015)   INR 1.3 (H) (05/27/2015) 1.1 (05/11/2015)     Cardiac:   No results found for requested labs within last 365 days.     Lipids:   Results in Past 365 Days  Result Component Current Result Previous Result   Triglycerides 156 (!) (05/26/2015) 347 (!) (05/11/2015)     Cardiac/Imaging Data     ECG/Telemetry: regular tachycardia at 110s-120s         Echo Complete 05/29/2015    Narrative  Tachycardia NOS.   Mildly hypertrophied LV with left atrial enlargement.  Normal calculated resting LVEF (60%) with reduced stroke volume but no   significant wall motion abnormalities.    No significant valvular  abnormalities.    Normal right heart size and function with estimated normal pulmonary   artery pressures.                     Impression and Plan     Active Problems:    Myeloproliferative disease      In my clinical evaluation/judgment this patient does not have an AMI.    This is an 55 y.o. female with recurrent atrial flutter s/p PBSCT. Her rate control had improved until yesterday evening but is now a-flutter with intermittent block, heart rates in the 120-150s. Her rate did improve on telemetry after diltiazem IV.    - Recommend increasing diltiazem dose to 240mg  BID as long as BP tolerates   - Give additional 60mg  PO now  - If heart rate remains poorly controlled despite escalating diltiazem dosing, recommend adding digoxin   - Load with 0.5mg  bolus IV followed by 0.25mg  Q6h x4 doses   - Then 0.125mg  PO daily              - Supplement lytes.            Attending addendum to follow.    Gae Dry, MD  Electronically signed on 06/04/2015 at 1:42 PM.      Cardiology attending note:  I have personally seen and examined this patient, and I have reviewed relevant notes and data. I have reviewed the above note of the cardiology resident/fellow and have made changes to the note as appropriate regarding the data, findings, impressions and / or plan of care as documented. Please see our detailed recommendations above. Patient is an 55 y.o. female with recurrent atrial flutter s/p PBSCT. Her rate control had improved until yesterday evening but is now a-flutter with heart rates in the 120-150s. Her rate did improve on telemetry after diltiazem IV. Recommendations as above. She appears volume overloaded and would benefit from gentle diuresis. Monitor I&O and daily weights. Supplement lytes.  Billie Ruddy, MD

## 2015-06-04 NOTE — Interdisciplinary Rounds (Signed)
Interdisciplinary Rounds Note    Date: 06/04/2015   Time: 9:17 AM   Attendance:  Attending, PA, Charge RN    Admit Date/Time:  05/11/2015  2:02 PM    Principal Problem: <principal problem not specified>  Problem List:   Patient Active Problem List    Diagnosis Date Noted    Myeloproliferative disease 05/11/2015    MPN (myeloproliferative neoplasm) 01/19/2014    Leukocytosis 09/22/2013       The patient's problem list and interdisciplinary care plan was reviewed.    Discharge Planning                 *Does patient currently have home care services?: No     *Current External Services: None                      Plan    05/14/15 Day -3.  Will receive Flu/Bu tonight.  Pt tolerating chemo well.  Continue supportive care.   5/7: doing well, continue supportive care  5/8:  Day -1.  Mild nausea, fatigue.  Continue supportive care.  05/17/2015 Day 0, WBC 3.3, ANC 3.2, Hct 23, plts 36, will receive MUD PBSCs today, continue supportive care.  05/18/2015 Day +1 WBC 3.9, ANC 3.7, hct 21, plts 52K, received MUD PBSCs yesterday, tolerated well, MTX given per protocol, continue supportive care.  05/19/15:  Day +2.  Counts dropping with ANC 2600 today.  Glucose control with BG's and SS insulin coverage d/t steroids.  Continue supportive care.  05/20/15 D +3  Labs stable/falling. Multiple episodes of vomiting yesterday, abd discomfort.   Continue supportive care.  5/13: Day +4. BMX for mucositis. Dizzy- 500 ml bolus and add daily prn bolus. Helmet/assist out of bed.  05/22/15: Day +5 today, c/o rectal pain after having diarrhea, given topicals to alleviate burning discomfort, feeling better today. Continue supportive care.  05/23/15:  Day +6.  Mild complaints of nausea and diarrhea.  Check stool for C-Diff and if negative will start imodium.  Complaining of rectal pain - adjust medications to control discomfort.  Due fpr MTX today.  ANC 700,  Plt 7 K and hct 19 - transfused with both products today. Continue supportive care.  05/24/2015 Day  +7 WBC 0.6, ANC 0.4, hct 17, plts 9K, transfused with plts, will receive PRBCs, complaining of mucusitis pain, not eating, encouraged pt to take pain medication, also needs encouragement to get OOB, otherwise continue supportive care.  05/25/2015 Day +8 WBC 0.2, ANC 0.1, hct 20, plts 15K, complaining of increased pain for which the dilaudid helps but does not last per pt, starting PCA, continue other supportive measures.  05/26/15:  Day +9, nadired.  Receiving blood and plts today.  Will stop BG's and SS insulin coverage.  Encourage patient increase activity.  Continue supportive care.  05/27/15: Day +10.  Nadired, plts 10K and hct 19.  T.bili today 6.1.  Ursodiol increased to tid.  U/S of liver today.  Check PT/PTT.  Mucositis worse, tongue very swollen.  Continue IVF to hydrate.  Change diflucan to caspofungin as may contribute to increasing T.bili further.  Change dilaudid PCA to Morphine d/t persistent nausea. Continue supportive care.  05/28/15: Day +11 T-bili still increased at 5.4. No MTX today. Tachy to 150s this AM, bolus given. Still having persistent nausea, cont with antiemetics. Continue supportive care.  05/29/15: Day +12. HR still in the 150s, ANC 1100 today. CT angio done last night and ECHO to be done today. Decreasing  fluids to 32ml/hr. Pt states feeling better this morning. D/C scopolamine patch d/t urinary retention. Continue supportive care.   05/30/15:  Day +13.  Engrafting, WBC 3.0, ANC 1700.  T/bili down to 3.5.  Pt complains of SOB, increased pain in abdomen and throat.  HR continues to be sustained and elevated despite metoprolol.  Cardiology consult today, increase metoprolol to 25 mg q 6 hours with parameters.  Check KUB/CT abdomen.  Add empiric azithromycin. Change Morphine PCA to dilaudid for better pain control.  Had foley in over weekend and was d/c'ed at pt request - will recheck post void residual.  Continue supportive care.  06/01/2015 Day +15 WBC 4.4, ANC 2.6, hct 23, plts 102K, changed  from IV to po cardizem yesterday, sinus tacy 110's, will d/c tele today, continues on PCA with continued complaints of mucusitis pain, continues with edema and c/o SOB, will give lasix, 900 stool yesterday, thick but not watery, continue supportive care.  06/02/15:  Day +16.  Counts recovering.  Diarrhea resolving - will change imodium to prn.  Change tacro to po, d/c continuous PCA.  Have evaluate and treat.  Encourage po intake and try to get ready to discharge to home this week.  Continue supportive care.  06/03/15:  Day +17. Count recovery continues.  T.Bili 1.7 today.  Remains on oxygen for comfort, SOB on exertion.  Wt unchanged from yesterday, will give lasix today and wean oxygen.  Check chest xray.  Change caspofungin to diflucan.  Will remain on cefepime and flagyl till Monday to finish course for cholecystitis.  Continue supportive care.  06/04/15: Day +18. Counts stable. Oxygen for comfort continues, bloated, lasix 40 mg and re-check this afternoon. Telemetry. Continues with HR 120's-150's, will give IV metoprolol and re-consult cardiology.  Mg 1.0, will re-check this afternoon.           Anticipated Discharge Date:  Discharge Disposition: Home, choiced to [facility]

## 2015-06-04 NOTE — Progress Notes (Addendum)
Called to assess patient for HR in 130s. On telemetry HR is going from 120s to 150s. Patient found sitting at edge of bed stating she felt anxious because she couldn't sleep. She was having mild chest discomfort with deep breaths still.       Vitals:    06/04/15 0502   BP: 120/80   Pulse: (!) 120   Resp:    Temp:    Weight:    Height:      On exam she is anxious, tachycardic with an irregularly irregular rate and rhythm. CTAB anteriorly.     A/P:     Afib with RVR and anxiety:   - Another 5 mg IV metoprolol was pushed at bedside with good response  - will give another 50 mg Lopressor PO now and then reschedule todays BID dosing to start later  - magnesium IV being given for low magnesium level   - 0.5 mg IV ativan to be given for anxiety    Signed by Marquis Lunch, MD as of: 06/04/2015  at: 5:06 AM  Internal Medicine PGY-3  Pager: [phone]

## 2015-06-05 LAB — EKG 12-LEAD
P: 0 degrees
P: 0 degrees
P: 0 degrees
P: 0 degrees
P: 0 degrees
P: 0 degrees
QRS: 72 degrees
QRS: 77 degrees
QRS: 75 degrees
QRS: 83 degrees
QRS: 71 degrees
QRS: 79 degrees
Rate: 161 {beats}/min
Rate: 164 {beats}/min
Rate: 165 {beats}/min
Rate: 170 {beats}/min
Rate: 159 {beats}/min
Rate: 159 {beats}/min
Severity: ABNORMAL
Severity: ABNORMAL
Severity: ABNORMAL
Severity: ABNORMAL
Severity: ABNORMAL
Severity: ABNORMAL
Severity: ABNORMAL
Severity: ABNORMAL
Severity: ABNORMAL
Severity: ABNORMAL
Statement: ABNORMAL
Statement: ABNORMAL
Statement: ABNORMAL
Statement: BORDERLINE
T: 13 degrees
T: -46 degrees
T: -47 degrees
T: 60 degrees
T: 14 degrees
T: 88 degrees

## 2015-06-05 LAB — MAGNESIUM
Magnesium: 1.7 mEq/L (ref 1.3–2.1)
Magnesium: 1.4 mEq/L (ref 1.3–2.1)

## 2015-06-05 LAB — COMPREHENSIVE METABOLIC PANEL
ALT: 17 U/L (ref 0–35)
AST: 8 U/L (ref 0–35)
Albumin: 3.2 g/dL — ABNORMAL LOW (ref 3.5–5.2)
Alk Phos: 53 U/L (ref 35–105)
Anion Gap: 12 (ref 7–16)
Bilirubin,Total: 1.7 mg/dL — ABNORMAL HIGH (ref 0.0–1.2)
CO2: 29 mmol/L — ABNORMAL HIGH (ref 20–28)
Calcium: 8.4 mg/dL — ABNORMAL LOW (ref 8.6–10.2)
Chloride: 93 mmol/L — ABNORMAL LOW (ref 96–108)
Creatinine: 0.62 mg/dL (ref 0.51–0.95)
GFR,Black: 117 *
GFR,Caucasian: 102 *
Glucose: 186 mg/dL — ABNORMAL HIGH (ref 60–99)
Lab: 10 mg/dL (ref 6–20)
Potassium: 3.7 mmol/L (ref 3.3–5.1)
Sodium: 134 mmol/L (ref 133–145)
Total Protein: 5.2 g/dL — ABNORMAL LOW (ref 6.3–7.7)

## 2015-06-05 LAB — RBC MORPHOLOGY

## 2015-06-05 LAB — CBC AND DIFFERENTIAL
Baso # K/uL: 0 10*3/uL (ref 0.0–0.1)
Basophil %: 0 %
Eos # K/uL: 0 10*3/uL (ref 0.0–0.4)
Eosinophil %: 0 %
Hematocrit: 24 % — ABNORMAL LOW (ref 34–45)
Hemoglobin: 8 g/dL — ABNORMAL LOW (ref 11.2–15.7)
Lymph # K/uL: 0.4 10*3/uL — ABNORMAL LOW (ref 1.2–3.7)
Lymphocyte %: 5.3 %
MCH: 28 pg/cell (ref 26–32)
MCHC: 33 g/dL (ref 32–36)
MCV: 85 fL (ref 79–95)
Mono # K/uL: 0.9 10*3/uL (ref 0.2–0.9)
Monocyte %: 11.4 %
Neut # K/uL: 6.6 10*3/uL — ABNORMAL HIGH (ref 1.6–6.1)
Nucl RBC # K/uL: 0.1 10*3/uL — ABNORMAL HIGH (ref 0.0–0.0)
Nucl RBC %: 0.9 /100 WBC — ABNORMAL HIGH (ref 0.0–0.2)
Platelets: 154 10*3/uL — ABNORMAL LOW (ref 160–370)
RBC: 2.9 MIL/uL — ABNORMAL LOW (ref 3.9–5.2)
RDW: 18.5 % — ABNORMAL HIGH (ref 11.7–14.4)
Seg Neut %: 77.1 %
WBC: 8.2 10*3/uL (ref 4.0–10.0)

## 2015-06-05 LAB — DIFF MANUAL
Bands %: 4 % (ref 0–10)
Diff Based On: 114 CELLS
Metamyelocyte %: 2 % — ABNORMAL HIGH (ref 0–1)
Promyelocyte %: 1 % — ABNORMAL HIGH (ref 0–0)

## 2015-06-05 LAB — TYPE AND SCREEN
ABO RH Blood Type: A POS
Antibody Screen: NEGATIVE

## 2015-06-05 LAB — BASIC METABOLIC PANEL
Anion Gap: 13 (ref 7–16)
CO2: 29 mmol/L — ABNORMAL HIGH (ref 20–28)
Calcium: 8.5 mg/dL — ABNORMAL LOW (ref 8.6–10.2)
Chloride: 95 mmol/L — ABNORMAL LOW (ref 96–108)
Creatinine: 0.67 mg/dL (ref 0.51–0.95)
GFR,Black: 114 *
GFR,Caucasian: 99 *
Glucose: 197 mg/dL — ABNORMAL HIGH (ref 60–99)
Lab: 12 mg/dL (ref 6–20)
Potassium: 3.9 mmol/L (ref 3.3–5.1)
Sodium: 137 mmol/L (ref 133–145)

## 2015-06-05 LAB — TACROLIMUS LEVEL: Tacrolimus: 10.4 ng/mL

## 2015-06-05 MED ORDER — MAGNESIUM SULFATE 2 G IN NS 50 ML *I*
2000.0000 mg | INTRAVENOUS | Status: AC
Start: 2015-06-05 — End: 2015-06-05
  Administered 2015-06-05 (×2): 2000 mg via INTRAVENOUS
  Filled 2015-06-05 (×2): qty 50

## 2015-06-05 MED ORDER — SIMETHICONE 80 MG PO CHEW *I*
80.0000 mg | CHEWABLE_TABLET | Freq: Four times a day (QID) | ORAL | Status: DC
Start: 2015-06-05 — End: 2015-06-07
  Administered 2015-06-05 – 2015-06-07 (×10): 80 mg via ORAL
  Filled 2015-06-05 (×10): qty 1

## 2015-06-05 MED ORDER — FUROSEMIDE 10 MG/ML IJ SOLN *I*
40.0000 mg | Freq: Once | INTRAMUSCULAR | Status: AC
Start: 2015-06-05 — End: 2015-06-05
  Administered 2015-06-05: 40 mg via INTRAVENOUS
  Filled 2015-06-05: qty 4

## 2015-06-05 MED ORDER — MAGNESIUM SULFATE 2 G IN NS 50 ML *I*
4000.0000 mg | Freq: Every day | INTRAVENOUS | Status: DC
Start: 2015-06-06 — End: 2015-06-07
  Administered 2015-06-07: 4000 mg via INTRAVENOUS
  Filled 2015-06-05 (×2): qty 100

## 2015-06-05 NOTE — Progress Notes (Addendum)
[facility] Transplant Program APP Progress Note    CC   Carly Higgins is a 55 y.o. 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 By [employer] testing, she has mutations of TET2, ASXL1, CCND2, GATA2, SRSF2.     Admitted for MUD allogeneic PBSC transplant.     Length of stay  LOS: 25 days     Interval History   This morning patient sitting up on side of bed, states she feels much better than yesterday, asking about discharge plans.  HR control improved overnight after increased diltiazem dose in the evening, 80s-low 100s.  Cardiology to follow up with patient today, await further recommendations.  Off supplemental O2 with improved work of breathing and less shortness of breath.  However, O2 sat as low as 90% on room air; obtaining CT chest to evaluate further and repeating Lasix 40m IV x 1 today.      Plan to stop cefepime/Flagyl tomorrow, discharge as early as Tuesday. PT following, reconsulted 5/28 for evaluation for discharge planning; per PT will see her early Tuesday AM to confirm.    Review of Systems   A comprehensive review of 13 systems was performed; pertinent positives checked below:  []  fevers  []  fast pulse [x]  fatigue   []  chills []  chest pain []  dysuria    []  dry eyes []  nausea/emesis  []  rash   []  ear pain []  abdominal cramping []  skin changes   []  sores in mouth []  abdominal discomfort []  pain   []  sore throat []  loose stool, non-formed []  none of the above   []  cough  []  diarrhea, watery    []  sputum production  []  weight loss    [x]  shortness of breath []  insufficient oral intake      Vital Signs and I&O   Temp:  [36.4 C (97.5 F)-37.4 C (99.3 F)] 37.2 C (99 F)  Heart Rate:  [87-158] 87  Resp:  [22-36] 24  BP: (106-130)/(62-84) 106/62    Intake/Output Summary (Last 24 hours) at 06/05/15 1408  Last data filed at 06/05/15 1314   Gross per 24 hour   Intake             4670 ml   Output             2750 ml   Net             1920  ml        Physical Exam     Head:  Normocephalic, atraumatic   Eyes:  Conjunctiva/corneas clear, icterus noted    Throat: Oral mucosa pink & moist; healing lesions    Lungs:   Diffuse wheezing B/L improved from prior; unlabored breathing   Heart:  RRR   Abdomen:   Distention, nontender, BS present   Extremities: No edema   Pulses: 2+ and symmetric   Skin: No rashes or lesions   Neurologic: A&O X 3, CMS intact   Central venous access: IVAD - C/D/I      Assessment & Plan   Carly PESTERis a 55yo female with unclassifiable myeloproliferative neoplasm.  By [employer] testing, she has mutations of TET2, ASXL1, CCND2, GATA2, SRSF2. Admitted for MUD allogeneic PBSCT.     Atypical MPN  Completed Decitabine 261mm2 IV daily x 5 days - Cycle # 1 12/15/14  On intermittent/dose adjusted hydroxyurea at admission - discontinued 5/5 in setting of falling WBC     Day -  5 thru -2 (5/4-5/7):  Flu 63m/m2, Bu 1367mm2 - Busulfan dose adjusted to 28514mor last 2 doses (from 244m26mDay -1 (5/8): Rest day  Day 0 (5/9): MUD (10/10 HLA, female) allo PBSCT, ABO: A+/A+, CMV: neg/neg     Today is BMT day +18    Pancytopenia  Transfuse 1 unit RBCs for hematocrit <21% and/or transfuse 1 unit platelets for platelet count <11,000 OR transfuse 1 unit platelets for platelet count <20,000 if bleeding or fever.  Helmet for safety during periods of thrombocytopenia, per hospital policy    DVT ppx  Enoxaparin - hold for plts <30K, bleeding     ID  Cholecystitis on abd US 5[place]9  T bili trending down, now stabilized   Cipro/Flagyl started - Cipro stopped 5/20   Cefepime (5/20 to date), Flagyl (5/19 to date)  Planing for 10 day course of Cefepime/Flagyl    PNA on CTA   Tachycardic 5/20 - Cipro stopped, vancomycin 2g x 1 dose, started cefepime, cont Flagyl; CXR pulm edema vs infection   Hypoxic 5/21 - negative for PE, likely PNA    Azithromycin added 5/22 for atypical coverage - now complete    Viral prophylaxis with acyclovir  Fungal prophylaxis  with fluconazole    A-flutter  Cardiology consulted, started on dilt gtts and converted to PO with good effect, with recurrence overnight 5/27  Reconsulted Cardio, increased diltiazem 180mg51m to 240mg 63m cont metoprolol 50mg B61m Per Cardio gentle diuresis (once daily) for now, may consider digoxin if recurs     BMPs BID, keep Mg >2 and K >4  -Cont metoprolol 50mg BI73mIf no improvement with increased diltiazem dose, may start digoxin tomorrow   -Recommend holding off on further diuresis today, repeating Lasix 40mg IV 79mrrow if needed     Pulm  Repeat CXR 5/27 without significant change after diuresis  Weaned off O2, O2 sat 90% on RA currently   Obtaining CT chest noncontrast to evaluate continued low O2 sat   Repeating 40mg Lasi1m1 today - Cardio recs diuresing once daily at this time (see above)  Albuterol, Duonebs ATC wheezing     GVHD  Planned GVHD prophylaxis with tacrolimus and methotrexate  Target tacrolimus level 8-12, now on oral tacro  full dose MTX given day 1,3,6  Day 11 MTX held due to T biliof 5.4, mucositis     Renal  Creatinine at admission was 0.7, stable  Encouraged PO intake     Electrolyte imbalance  Replace electrolytes as to the following parameters  Hypokalemia, supplement when serum K <4 - supplemented today  Hypomagnesemia, supplement when serum Mag <2 - supplemented today   Hypocalcemia, supplement when corrected calcium <8.9    At risk for malnutrition/hypoalbuminemia  Regular diet  Albumin level on admission 4.3  Nutrition following     GI  Zofran TID  Simethicone QID  PRN Phenergan   Protonix Daily  PRN Tucks, Anusol for hemorrhoids    VOD/SOS  Moderate risk with ablative BU/Flu  Ursodiol TID  Transaminitis continues to trend down  Hyperbilirubinemia - stable      Mucositis  WHO Grade 2 - improving   Oxycodone PRN  Cont good oral care, normal saline rinses  PRN BMX    Deconditioning  PT following, reconsulted 5/28 for evaluation for discharge; per PT will see her early  Tuesday AM to confirm   Encouraged ambulation    Psychosocial  Cont emotional support  Smoking Cessation  Offered nicotine replacement & [place] [facility]ess[facility]; patient declining at this time    Dispo planning  Anticipate discharge following engraftment and clinical stability  BMT consent signed  BMT attending [name]  Primary care physician [name]  RN coordinator Anne/Sharon  Lives in [place]    Patient seen and discussed with Dr. Marcos Eke, PA    Lab Data      Lab results: 06/05/15  0126 06/04/15  0104 06/03/15  0020 06/02/15  0235  05/14/15  0106 05/13/15  0107 05/12/15  0527 05/11/15  1649   WBC 8.2 8.2 5.8 4.7  < > 35.4* 44.3* 56.1* 43.7*   Hemoglobin 8.0* 8.5* 8.1* 7.8*  < > 7.9* 6.5* 7.2* 7.1*   Hematocrit 24* 25* 24* 23*  < > 25* 21* 23* 22*   RBC 2.9* 3.1* 2.9* 2.8*  < > 2.7* 2.2* 2.5* 2.4*   Platelets 154* 172 141* 125*  < > 47* 54* 62* 59*   Neut # K/uL 6.6* 6.2* 3.8 3.2  < > 29.0* 32.8* 38.7* 27.1*   Lymph # K/uL 0.4* 0.3* 0.4* 0.1*  < > 1.1* 2.2 4.5* 2.6   Mono # K/uL 0.9 0.9 0.7 0.5  < > 0.3 0.0* 1.4* 0.4   Eos # K/uL 0.0 0.1 0.0 0.0  < > 0.0 0.4 0.0 0.0   Baso # K/uL 0.0 0.0 0.1 0.0  < > 0.0 0.0 0.0 0.0   Seg Neut % 77.1 73.2 57.3 67.3  < > 71.9 56.0 49.3 53.5   Lymphocyte % 5.3 3.4 6.0 2.6  < > 2.6 5.3 7.6 6.1   Monocyte % 11.4 11.2 12.8 10.3  < > 0.9 0.0 2.5 0.9   Eosinophil % 0.0 0.9 0.0 0.9  < > 0.0 0.9 0.0 0.0   Basophil % 0.0 0.0 1.7 0.0  < > 0.0 0.0 0.0 0.0   Bands % 4 3 9 2   < > 10 18* 20* 8   Myelocyte %  --  3* 3* 9*  < > 9* 4* 2* 12*   Metamyelocyte % 2* 5* 9* 6*  < > 4* 13* 17* 12*   Promyelocyte % 1*  --   --  3*  --  2* 1*  --  4*   Blasts %  --   --   --   --   --   --  2* 2* 3*   < > = values in this interval not displayed.            Lab results: 06/05/15  0126 06/04/15  1539 06/04/15  0104   Sodium 134 136 141   Potassium 3.7 3.5 3.4   Chloride 93* 93* 97   CO2 29* 27 30*   UN 10 9 7    Creatinine 0.62 0.61 0.61   GFR,Caucasian  102 102 102   GFR,Black 117 118 118   Glucose 186* 212* 160*   Calcium 8.4* 8.2* 8.5*           Lab results: 06/05/15  0126   Magnesium 1.7       Lab Results   Component Value Date    ALT 17 06/05/2015    AST 8 06/05/2015     Lab Results   Component Value Date    ALK 53 06/05/2015     Lab Results   Component Value Date    TB 1.7 (H) 06/05/2015  LD   Date Value Ref Range Status   06/03/2015 228 (H) 118 - 225 U/L Final     LD, PL   Date Value Ref Range Status   05/09/2015 1146 (H) 118 - 225 U/L Final     LDL Calculated   Date Value Ref Range Status   08/27/2013 113 mg/dL Final     Comment:     REFERENCE RANGE:  < 100 Optimal                  100-129 Near or above optimal                  130-159 Borderline High                  160-189 High                    > 189 Very High

## 2015-06-05 NOTE — Interdisciplinary Rounds (Signed)
Interdisciplinary Rounds Note    Date: 06/05/2015   Time: 11:09 AM   Attendance:  Attending, PA, Charge RN    Admit Date/Time:  05/11/2015  2:02 PM    Principal Problem: <principal problem not specified>  Problem List:   Patient Active Problem List    Diagnosis Date Noted    Myeloproliferative disease 05/11/2015    MPN (myeloproliferative neoplasm) 01/19/2014    Leukocytosis 09/22/2013       The patient's problem list and interdisciplinary care plan was reviewed.    Discharge Planning                 *Does patient currently have home care services?: No     *Current External Services: None                      Plan    05/14/15 Day -3.  Will receive Flu/Bu tonight.  Pt tolerating chemo well.  Continue supportive care.   5/7: doing well, continue supportive care  5/8:  Day -1.  Mild nausea, fatigue.  Continue supportive care.  05/17/2015 Day 0, WBC 3.3, ANC 3.2, Hct 23, plts 36, will receive MUD PBSCs today, continue supportive care.  05/18/2015 Day +1 WBC 3.9, ANC 3.7, hct 21, plts 52K, received MUD PBSCs yesterday, tolerated well, MTX given per protocol, continue supportive care.  05/19/15:  Day +2.  Counts dropping with ANC 2600 today.  Glucose control with BG's and SS insulin coverage d/t steroids.  Continue supportive care.  05/20/15 D +3  Labs stable/falling. Multiple episodes of vomiting yesterday, abd discomfort.   Continue supportive care.  5/13: Day +4. BMX for mucositis. Dizzy- 500 ml bolus and add daily prn bolus. Helmet/assist out of bed.  05/22/15: Day +5 today, c/o rectal pain after having diarrhea, given topicals to alleviate burning discomfort, feeling better today. Continue supportive care.  05/23/15:  Day +6.  Mild complaints of nausea and diarrhea.  Check stool for C-Diff and if negative will start imodium.  Complaining of rectal pain - adjust medications to control discomfort.  Due fpr MTX today.  ANC 700,  Plt 7 K and hct 19 - transfused with both products today. Continue supportive care.  05/24/2015 Day  +7 WBC 0.6, ANC 0.4, hct 17, plts 9K, transfused with plts, will receive PRBCs, complaining of mucusitis pain, not eating, encouraged pt to take pain medication, also needs encouragement to get OOB, otherwise continue supportive care.  05/25/2015 Day +8 WBC 0.2, ANC 0.1, hct 20, plts 15K, complaining of increased pain for which the dilaudid helps but does not last per pt, starting PCA, continue other supportive measures.  05/26/15:  Day +9, nadired.  Receiving blood and plts today.  Will stop BG's and SS insulin coverage.  Encourage patient increase activity.  Continue supportive care.  05/27/15: Day +10.  Nadired, plts 10K and hct 19.  T.bili today 6.1.  Ursodiol increased to tid.  U/S of liver today.  Check PT/PTT.  Mucositis worse, tongue very swollen.  Continue IVF to hydrate.  Change diflucan to caspofungin as may contribute to increasing T.bili further.  Change dilaudid PCA to Morphine d/t persistent nausea. Continue supportive care.  05/28/15: Day +11 T-bili still increased at 5.4. No MTX today. Tachy to 150s this AM, bolus given. Still having persistent nausea, cont with antiemetics. Continue supportive care.  05/29/15: Day +12. HR still in the 150s, ANC 1100 today. CT angio done last night and ECHO to be done today. Decreasing  fluids to 48ml/hr. Pt states feeling better this morning. D/C scopolamine patch d/t urinary retention. Continue supportive care.   05/30/15:  Day +13.  Engrafting, WBC 3.0, ANC 1700.  T/bili down to 3.5.  Pt complains of SOB, increased pain in abdomen and throat.  HR continues to be sustained and elevated despite metoprolol.  Cardiology consult today, increase metoprolol to 25 mg q 6 hours with parameters.  Check KUB/CT abdomen.  Add empiric azithromycin. Change Morphine PCA to dilaudid for better pain control.  Had foley in over weekend and was d/c'ed at pt request - will recheck post void residual.  Continue supportive care.  06/01/2015 Day +15 WBC 4.4, ANC 2.6, hct 23, plts 102K, changed  from IV to po cardizem yesterday, sinus tacy 110's, will d/c tele today, continues on PCA with continued complaints of mucusitis pain, continues with edema and c/o SOB, will give lasix, 900 stool yesterday, thick but not watery, continue supportive care.  06/02/15:  Day +16.  Counts recovering.  Diarrhea resolving - will change imodium to prn.  Change tacro to po, d/c continuous PCA.  Have evaluate and treat.  Encourage po intake and try to get ready to discharge to home this week.  Continue supportive care.  06/03/15:  Day +17. Count recovery continues.  T.Bili 1.7 today.  Remains on oxygen for comfort, SOB on exertion.  Wt unchanged from yesterday, will give lasix today and wean oxygen.  Check chest xray.  Change caspofungin to diflucan.  Will remain on cefepime and flagyl till Monday to finish course for cholecystitis.  Continue supportive care.  06/04/15: Day +18. Counts stable. Oxygen for comfort continues, bloated, lasix 40 mg and re-check this afternoon. Telemetry. Continues with HR 120's-150's, will give IV metoprolol and re-consult cardiology.  Mg 1.0, will re-check this afternoon.   06/05/15: Day +19. Engrafted, antibiotics course done tomorrow, not requiring oxygen, but team concerned that her oxygen sats are still low, probably d/t fluid, but sending her for CTscan of her chest just to be safe. Continue supportive care.          Anticipated Discharge Date:  Discharge Disposition: Home, choiced to [facility]

## 2015-06-05 NOTE — Progress Notes (Signed)
PT order received, pt currently being followed by PT and on program to be seen 3-5x/week with recommendations for d/c home with Intermittent supervision. Will continue to follow according to this plan, please contact if needs arise sooner.     Reginold Agent, PT, DPT  Pager [phone]

## 2015-06-05 NOTE — Progress Notes (Signed)
Comfort - BMT-related     Nausea controlled or eliminated Maintaining     Diarrhea controlled or eliminated Maintaining     Without infection Maintaining        Graft Versus Host Disease (GVHD) Bundle     Without rash Maintaining     Stool output within parameters (</= 527ml in 24 h) Maintaining     Total bilirubin </= 2 Maintaining        Mobility     Patient's functional status is maintained or improved Maintaining        Nutrition     Patient's nutritional status is maintained or improved Maintaining        Pain/Comfort     Patient's pain or discomfort is manageable Maintaining        Psychosocial     Demonstrates ability to cope with illness Maintaining        Safety     Patient will remain free of falls Maintaining          (1900-0700) Pt. AVSS on 4L NC, A&O x3.  Fall prevention teaching reinforced with patient, patient verbalized understanding.  Will continue to monitor and provide handoff to oncoming nurse for continuity of care.

## 2015-06-06 ENCOUNTER — Other Ambulatory Visit: Payer: Self-pay | Admitting: Gastroenterology

## 2015-06-06 LAB — PHOSPHORUS: Phosphorus: 3.4 mg/dL (ref 2.7–4.5)

## 2015-06-06 LAB — COMPREHENSIVE METABOLIC PANEL
ALT: 15 U/L (ref 0–35)
AST: 12 U/L (ref 0–35)
Albumin: 3.1 g/dL — ABNORMAL LOW (ref 3.5–5.2)
Alk Phos: 52 U/L (ref 35–105)
Anion Gap: 13 (ref 7–16)
Bilirubin,Total: 1.3 mg/dL — ABNORMAL HIGH (ref 0.0–1.2)
CO2: 29 mmol/L — ABNORMAL HIGH (ref 20–28)
Calcium: 8.4 mg/dL — ABNORMAL LOW (ref 8.6–10.2)
Chloride: 96 mmol/L (ref 96–108)
Creatinine: 0.64 mg/dL (ref 0.51–0.95)
GFR,Black: 116 *
GFR,Caucasian: 101 *
Glucose: 184 mg/dL — ABNORMAL HIGH (ref 60–99)
Lab: 12 mg/dL (ref 6–20)
Potassium: 3.8 mmol/L (ref 3.3–5.1)
Sodium: 138 mmol/L (ref 133–145)
Total Protein: 5.2 g/dL — ABNORMAL LOW (ref 6.3–7.7)

## 2015-06-06 LAB — DIFF MANUAL
Bands %: 1 % (ref 0–10)
Blasts %: 1 % — ABNORMAL HIGH (ref 0–0)
Diff Based On: 115 CELLS
Metamyelocyte %: 3 % — ABNORMAL HIGH (ref 0–1)
Myelocyte %: 1 % — ABNORMAL HIGH (ref 0–0)

## 2015-06-06 LAB — BASIC METABOLIC PANEL
Anion Gap: 14 (ref 7–16)
CO2: 28 mmol/L (ref 20–28)
Calcium: 8.4 mg/dL — ABNORMAL LOW (ref 8.6–10.2)
Chloride: 97 mmol/L (ref 96–108)
Creatinine: 0.58 mg/dL (ref 0.51–0.95)
GFR,Black: 120 *
GFR,Caucasian: 104 *
Glucose: 145 mg/dL — ABNORMAL HIGH (ref 60–99)
Lab: 11 mg/dL (ref 6–20)
Potassium: 3.8 mmol/L (ref 3.3–5.1)
Sodium: 139 mmol/L (ref 133–145)

## 2015-06-06 LAB — CBC AND DIFFERENTIAL
Baso # K/uL: 0 10*3/uL (ref 0.0–0.1)
Basophil %: 0 %
Eos # K/uL: 0.1 10*3/uL (ref 0.0–0.4)
Eosinophil %: 0.9 %
Hematocrit: 25 % — ABNORMAL LOW (ref 34–45)
Hemoglobin: 8.2 g/dL — ABNORMAL LOW (ref 11.2–15.7)
Lymph # K/uL: 0.8 10*3/uL — ABNORMAL LOW (ref 1.2–3.7)
Lymphocyte %: 12.2 %
MCH: 28 pg/cell (ref 26–32)
MCHC: 33 g/dL (ref 32–36)
MCV: 86 fL (ref 79–95)
Mono # K/uL: 0.4 10*3/uL (ref 0.2–0.9)
Monocyte %: 5.2 %
Neut # K/uL: 5.2 10*3/uL (ref 1.6–6.1)
Nucl RBC # K/uL: 0.1 10*3/uL — ABNORMAL HIGH (ref 0.0–0.0)
Nucl RBC %: 0.7 /100 WBC — ABNORMAL HIGH (ref 0.0–0.2)
Platelets: 182 10*3/uL (ref 160–370)
RBC: 2.9 MIL/uL — ABNORMAL LOW (ref 3.9–5.2)
RDW: 18.8 % — ABNORMAL HIGH (ref 11.7–14.4)
Seg Neut %: 76.4 %
WBC: 6.8 10*3/uL (ref 4.0–10.0)

## 2015-06-06 LAB — PREALBUMIN: Prealbumin: 9 mg/dL — ABNORMAL LOW (ref 20–40)

## 2015-06-06 LAB — LACTATE DEHYDROGENASE: LD: 184 U/L (ref 118–225)

## 2015-06-06 LAB — MAGNESIUM
Magnesium: 1.9 mEq/L (ref 1.3–2.1)
Magnesium: 1.4 mEq/L (ref 1.3–2.1)

## 2015-06-06 LAB — TACROLIMUS LEVEL: Tacrolimus: 13 ng/mL

## 2015-06-06 MED ORDER — ACYCLOVIR 400 MG PO TABS *I*
400.0000 mg | ORAL_TABLET | Freq: Two times a day (BID) | ORAL | 5 refills | Status: DC
Start: 2015-06-06 — End: 2015-12-26

## 2015-06-06 MED ORDER — POTASSIUM CHLORIDE 20 MEQ/50ML IV SOLN *I*
20.0000 meq | Freq: Once | INTRAVENOUS | Status: AC
Start: 2015-06-06 — End: 2015-06-06
  Administered 2015-06-06: 20 meq via INTRAVENOUS
  Filled 2015-06-06: qty 50

## 2015-06-06 MED ORDER — NICOTINE POLACRILEX 2 MG MT LOZG *I*
LOZENGE | OROMUCOSAL | 1 refills | Status: DC
Start: 2015-06-06 — End: 2015-08-10

## 2015-06-06 MED ORDER — GENERIC DME *A*
6 refills | Status: DC
Start: 2015-06-06 — End: 2015-07-01

## 2015-06-06 MED ORDER — NICOTINE 14 MG/24HR TD PT24 *I*
1.0000 | MEDICATED_PATCH | Freq: Every day | TRANSDERMAL | 2 refills | Status: DC
Start: 2015-06-06 — End: 2015-07-13

## 2015-06-06 MED ORDER — METOPROLOL TARTRATE 50 MG PO TABS *I*
50.0000 mg | ORAL_TABLET | Freq: Two times a day (BID) | ORAL | 1 refills | Status: DC
Start: 2015-06-06 — End: 2015-07-26

## 2015-06-06 MED ORDER — TACROLIMUS 1 MG PO CAPS *I*
2.5000 mg | ORAL_CAPSULE | Freq: Two times a day (BID) | ORAL | Status: DC
Start: 2015-06-07 — End: 2015-06-07
  Administered 2015-06-07: 2.5 mg via ORAL
  Filled 2015-06-06 (×3): qty 1

## 2015-06-06 MED ORDER — ONDANSETRON HCL 4 MG PO TABS *I*
4.0000 mg | ORAL_TABLET | Freq: Three times a day (TID) | ORAL | 0 refills | Status: DC | PRN
Start: 2015-06-06 — End: 2015-09-07

## 2015-06-06 MED ORDER — PANTOPRAZOLE SODIUM 40 MG PO TBEC *I*
40.0000 mg | DELAYED_RELEASE_TABLET | Freq: Every morning | ORAL | 5 refills | Status: DC
Start: 2015-06-06 — End: 2015-07-07

## 2015-06-06 MED ORDER — MAGNESIUM SULFATE 2 G IN NS 50 ML *I*
2000.0000 mg | INTRAVENOUS | Status: AC
Start: 2015-06-06 — End: 2015-06-06
  Administered 2015-06-06 (×2): 2000 mg via INTRAVENOUS
  Filled 2015-06-06 (×2): qty 50

## 2015-06-06 MED ORDER — FLUCONAZOLE 200 MG PO TABS *I*
200.0000 mg | ORAL_TABLET | Freq: Every evening | ORAL | 5 refills | Status: DC
Start: 2015-06-06 — End: 2015-12-26

## 2015-06-06 MED ORDER — DILTIAZEM HCL CR 120 MG PO CP12 *I*
240.0000 mg | ORAL_CAPSULE | Freq: Two times a day (BID) | ORAL | 1 refills | Status: DC
Start: 2015-06-06 — End: 2015-07-07

## 2015-06-06 NOTE — Progress Notes (Signed)
Review of discharge booklet with patient and demonstrated flushing IVAD with saline.  IV Magnesium teach originally set up for Wednesday, 5/31 but pt would like to go home tomorrow for her birthday.  [place].  On call person left message for [employer] [facility] who is patients vendor to see if teach can be done tomorrow (5/30).  Pt will not be going to her home initially.  She will be staying with her parents, Wille Glaser and Vernell Leep, [address], [place], [place] [id number]; phone [phone].

## 2015-06-06 NOTE — Progress Notes (Signed)
Patient placed on sleep study. Sat is 90%, HR 78.

## 2015-06-06 NOTE — Significant Event (Signed)
[facility] Overnight CC Note    Notified by RN that HR 180s. Upon walking down to [facility], patient's telemetry would fluctuate between 140-210s with a NCT of low voltage. Telemetry illustrated that patient's QRS were appropriately ~ 500 msec away [IE HR ~100] and that the monitor was recording each flutter wave as a QRS.     Patient had just returned from the bathroom but had no dyspnea, angina, palpitations, or any other symptoms. Her SBPs ~ 110s and an EKG confirmed Aflutter with a ventricular rate 70s while the telemetry would read 180s.     No pharmacologic therapy needed as telemetry reading was artifact.     Discussed with bedside RNs.     Francine Graven, MD 06/06/2015 11:39 PM

## 2015-06-06 NOTE — Interdisciplinary Rounds (Signed)
Interdisciplinary Rounds Note    Date: 06/06/2015   Time: 8:07 AM   Attendance:  Attending, PA, Charge RN    Admit Date/Time:  05/11/2015  2:02 PM    Principal Problem: <principal problem not specified>  Problem List:   Patient Active Problem List    Diagnosis Date Noted    Myeloproliferative disease 05/11/2015    MPN (myeloproliferative neoplasm) 01/19/2014    Leukocytosis 09/22/2013       The patient's problem list and interdisciplinary care plan was reviewed.    Discharge Planning                 *Does patient currently have home care services?: No     *Current External Services: None                      Plan    05/14/15 Day -3.  Will receive Flu/Bu tonight.  Pt tolerating chemo well.  Continue supportive care.   5/7: doing well, continue supportive care  5/8:  Day -1.  Mild nausea, fatigue.  Continue supportive care.  05/17/2015 Day 0, WBC 3.3, ANC 3.2, Hct 23, plts 36, will receive MUD PBSCs today, continue supportive care.  05/18/2015 Day +1 WBC 3.9, ANC 3.7, hct 21, plts 52K, received MUD PBSCs yesterday, tolerated well, MTX given per protocol, continue supportive care.  05/19/15:  Day +2.  Counts dropping with ANC 2600 today.  Glucose control with BG's and SS insulin coverage d/t steroids.  Continue supportive care.  05/20/15 D +3  Labs stable/falling. Multiple episodes of vomiting yesterday, abd discomfort.   Continue supportive care.  5/13: Day +4. BMX for mucositis. Dizzy- 500 ml bolus and add daily prn bolus. Helmet/assist out of bed.  05/22/15: Day +5 today, c/o rectal pain after having diarrhea, given topicals to alleviate burning discomfort, feeling better today. Continue supportive care.  05/23/15:  Day +6.  Mild complaints of nausea and diarrhea.  Check stool for C-Diff and if negative will start imodium.  Complaining of rectal pain - adjust medications to control discomfort.  Due fpr MTX today.  ANC 700,  Plt 7 K and hct 19 - transfused with both products today. Continue supportive care.  05/24/2015 Day  +7 WBC 0.6, ANC 0.4, hct 17, plts 9K, transfused with plts, will receive PRBCs, complaining of mucusitis pain, not eating, encouraged pt to take pain medication, also needs encouragement to get OOB, otherwise continue supportive care.  05/25/2015 Day +8 WBC 0.2, ANC 0.1, hct 20, plts 15K, complaining of increased pain for which the dilaudid helps but does not last per pt, starting PCA, continue other supportive measures.  05/26/15:  Day +9, nadired.  Receiving blood and plts today.  Will stop BG's and SS insulin coverage.  Encourage patient increase activity.  Continue supportive care.  05/27/15: Day +10.  Nadired, plts 10K and hct 19.  T.bili today 6.1.  Ursodiol increased to tid.  U/S of liver today.  Check PT/PTT.  Mucositis worse, tongue very swollen.  Continue IVF to hydrate.  Change diflucan to caspofungin as may contribute to increasing T.bili further.  Change dilaudid PCA to Morphine d/t persistent nausea. Continue supportive care.  05/28/15: Day +11 T-bili still increased at 5.4. No MTX today. Tachy to 150s this AM, bolus given. Still having persistent nausea, cont with antiemetics. Continue supportive care.  05/29/15: Day +12. HR still in the 150s, ANC 1100 today. CT angio done last night and ECHO to be done today. Decreasing  fluids to 65ml/hr. Pt states feeling better this morning. D/C scopolamine patch d/t urinary retention. Continue supportive care.   05/30/15:  Day +13.  Engrafting, WBC 3.0, ANC 1700.  T/bili down to 3.5.  Pt complains of SOB, increased pain in abdomen and throat.  HR continues to be sustained and elevated despite metoprolol.  Cardiology consult today, increase metoprolol to 25 mg q 6 hours with parameters.  Check KUB/CT abdomen.  Add empiric azithromycin. Change Morphine PCA to dilaudid for better pain control.  Had foley in over weekend and was d/c'ed at pt request - will recheck post void residual.  Continue supportive care.  06/01/2015 Day +15 WBC 4.4, ANC 2.6, hct 23, plts 102K, changed  from IV to po cardizem yesterday, sinus tacy 110's, will d/c tele today, continues on PCA with continued complaints of mucusitis pain, continues with edema and c/o SOB, will give lasix, 900 stool yesterday, thick but not watery, continue supportive care.  06/02/15:  Day +16.  Counts recovering.  Diarrhea resolving - will change imodium to prn.  Change tacro to po, d/c continuous PCA.  Have evaluate and treat.  Encourage po intake and try to get ready to discharge to home this week.  Continue supportive care.  06/03/15:  Day +17. Count recovery continues.  T.Bili 1.7 today.  Remains on oxygen for comfort, SOB on exertion.  Wt unchanged from yesterday, will give lasix today and wean oxygen.  Check chest xray.  Change caspofungin to diflucan.  Will remain on cefepime and flagyl till Monday to finish course for cholecystitis.  Continue supportive care.  06/04/15: Day +18. Counts stable. Oxygen for comfort continues, bloated, lasix 40 mg and re-check this afternoon. Telemetry. Continues with HR 120's-150's, will give IV metoprolol and re-consult cardiology.  Mg 1.0, will re-check this afternoon.   06/05/15: Day +19. Engrafted, antibiotics course done tomorrow, not requiring oxygen, but team concerned that her oxygen sats are still low, probably d/t fluid, but sending her for CTscan of her chest just to be safe. Continue supportive care.  06/06/15:  Day +20.  Plan for discharge tomorrow or Wednesday.  Needs IV Magnesium teach.  Remains on tele and needs oxygen at night for sats in mid 80's.  Will check pulse oxygenation overnight and ambulatory O2 sat.  Stop IV antibiotics today.        Anticipated Discharge Date:  Discharge Disposition: Home, choiced to UR

## 2015-06-06 NOTE — Progress Notes (Addendum)
Blood & Marrow Transplant Program APP Progress Note    CC   Carly Higgins is a 55 y.o. 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 By [employer] testing, she has mutations of TET2, ASXL1, CCND2, GATA2, SRSF2.     Admitted for MUD allogeneic PBSC transplant.     Length of stay  LOS: 26 days     Interval History   Overnight patient's HR controlled on current regimen.  Desaturated to 85% on room air while sleeping, started on 4L O2 via NC and quickly able to be weaned off. CT chest yesterday improved from prior study, new nodular opacities in RLL; plan to follow as outpatient. Will obtain ambulatory O2 sat, overnight O2 sat to evaluate need for home O2.       Planning for discharge tomorrow, patient very eager.  PT to see early tomorrow AM.  Encouraged working on ambulation, drinking fluids today.    Asymptomatic run of VT this afternoon with remergence of A-flutter though maintaining rate control, Cardiology aware, no new recommendations at this time.    Review of Systems   A comprehensive review of 13 systems was performed; pertinent positives checked below:  _0  fevers  _1  fast pulse _2  fatigue   _3  chills _4  chest pain _5  dysuria    _6  dry eyes _7  nausea/emesis  _8  rash   _9  ear pain _10  abdominal cramping _11  skin changes   _12  sores in mouth _13  abdominal discomfort _14  pain   _15  sore throat _16  loose stool, non-formed _17  none of the above   _18  cough  _19  diarrhea, watery    _20  sputum production  _21  weight loss    _22  shortness of breath _23  insufficient oral intake      Vital Signs and I&O   Temp:  [36.2 C (97.2 F)-37 C (98.6 F)] 36.4 C (97.5 F)  Heart Rate:  [83-95] 88  Resp:  [20] 20  BP: (107-127)/(50-70) 115/55    Intake/Output Summary (Last 24 hours) at 06/06/15 1245  Last data filed at 06/06/15 1135   Gross per 24 hour   Intake             2565 ml   Output             3100 ml   Net             -535 ml        Physical Exam     Head:   Normocephalic, atraumatic   Eyes:  Conjunctiva/corneas clear, icterus noted    Throat: Oral mucosa pink & moist; healing lesions    Lungs:   Diffuse wheezing B/L improved from prior; unlabored breathing   Heart:  RRR   Abdomen:   Distention, nontender, BS present   Extremities: No edema   Pulses: 2+ and symmetric   Skin: No rashes or lesions   Neurologic: A&O X 3, CMS intact   Central venous access: IVAD - C/D/I      Assessment & Plan   Carly Higgins is a 55 yo female with unclassifiable myeloproliferative neoplasm.  By [employer] testing, she has mutations of TET2, ASXL1, CCND2, GATA2, SRSF2. Admitted for MUD allogeneic PBSCT.     Atypical MPN  Completed Decitabine 5m/m2 IV daily x 5 days - Cycle # 1 12/15/14  On intermittent/dose adjusted hydroxyurea at admission - discontinued 5/5 in setting of falling WBC     Day -5 thru -2 (5/4-5/7):  Flu 11m/m2, Bu 1367mm2 - Busulfan dose adjusted to 28554mor last 2 doses (from 244m23mDay -1 (5/8): Rest day  Day 0 (5/9): MUD (10/10 HLA, female) allo PBSCT, ABO: A+/A+, CMV: neg/neg     Today is BMT day +20    Pancytopenia  Transfuse 1 unit RBCs for hematocrit <21% and/or transfuse 1 unit platelets for platelet count <11,000 OR transfuse 1 unit platelets for platelet count <20,000 if bleeding or fever.  Helmet for safety during periods of thrombocytopenia, per hospital policy    DVT ppx  Enoxaparin - hold for plts <30K, bleeding     ID  Cholecystitis on abd US 5[place]9  T bili trending down, now stabilized   Cipro/Flagyl started - Cipro stopped 5/20   Completed course of Cefepime/Flagyl (5/19-5/29)    PNA on CTA   Tachycardic 5/20 - Cipro stopped, vancomycin 2g x 1 dose, started cefepime, cont Flagyl; CXR pulm edema vs infection   Hypoxic 5/21 - negative for PE, likely PNA    Azithromycin added 5/22 for atypical coverage - now complete    Viral prophylaxis with acyclovir  Fungal prophylaxis with fluconazole    A-flutter  Cardiology consulted, started on dilt gtts and  converted to PO with good effect, with recurrence overnight 5/27  Reconsulted Cardio, increased diltiazem 180mg86m to 240mg 42m cont metoprolol 50mg B71m Per Cardio gentle diuresis (once daily max) for now, may consider digoxin if recurs     BMPs BID, keep Mg >2 and K >4    Pulm  Repeat CXR 5/27 without significant change after diuresis  CT chest 5/28 to evaluate continued low O2 sat improved from prior, new irregular nodular opacities in RLL; will re-eval as OP   Overnight O2 sat, ambulatory O2 sat today - may require home O2   Albuterol, Duonebs PRN wheezing     GVHD  Planned GVHD prophylaxis with tacrolimus and methotrexate  Target tacrolimus level 8-12, now on oral tacro  Full dose MTX given day 1,3,6    Day 11 MTX held due to T biliof 5.4, mucositis     Renal  Creatinine at admission was 0.7, stable  Encouraged PO intake     Electrolyte imbalance  Replace electrolytes as to the following parameters  Hypokalemia, supplement when serum K <4 - supplemented today  Hypomagnesemia, supplement when serum Mag <2 - supplemented today   Hypocalcemia, supplement when corrected calcium <8.9    At risk for malnutrition/hypoalbuminemia  Regular diet  Albumin level on admission 4.3  Nutrition following     GI  Zofran TID  Simethicone QID  PRN Phenergan   Protonix Daily  PRN Tucks, Anusol for hemorrhoids    VOD/SOS  Moderate risk with ablative BU/Flu  Ursodiol TID  Transaminitis continues to trend down  Hyperbilirubinemia - stable      Mucositis  WHO Grade 2 - improving   Oxycodone PRN  Cont good oral care, normal saline rinses  PRN BMX    Deconditioning  PT following, reconsulted 5/28 for evaluation for discharge; per PT will see her early Tuesday AM to confirm   Encouraged ambulation    Psychosocial  Cont emotional support     Smoking Cessation  Offered nicotine replacement & [place] [facility]; patient declining at this time    Dispo planning  Anticipate discharge following engraftment and  clinical stability  BMT consent signed  BMT attending Dr. LiesvelLytle Buttery care physician Dr. CahnhidLenn Calordinator Anne/Sharon  Lives in [place]    Patient seen and discussed with Dr. Marcos Eke, PA    Lab Data      Lab results: 06/06/15  0206 06/05/15  0126 06/04/15  0104 06/03/15  0020 06/02/15  0235  05/14/15  0106 05/13/15  0107 05/12/15  0527   WBC 6.8 8.2 8.2 5.8 4.7  < > 35.4* 44.3* 56.1*   Hemoglobin 8.2* 8.0* 8.5* 8.1* 7.8*  < > 7.9* 6.5* 7.2*   Hematocrit 25* 24* 25* 24* 23*  < > 25* 21* 23*   RBC 2.9* 2.9* 3.1* 2.9* 2.8*  < > 2.7* 2.2* 2.5*   Platelets 182 154* 172 141* 125*  < > 47* 54* 62*   Neut # K/uL 5.2 6.6* 6.2* 3.8 3.2  < > 29.0* 32.8* 38.7*   Lymph # K/uL 0.8* 0.4* 0.3* 0.4* 0.1*  < > 1.1* 2.2 4.5*   Mono # K/uL 0.4 0.9 0.9 0.7 0.5  < > 0.3 0.0* 1.4*   Eos # K/uL 0.1 0.0 0.1 0.0 0.0  < > 0.0 0.4 0.0   Baso # K/uL 0.0 0.0 0.0 0.1 0.0  < > 0.0 0.0 0.0   Seg Neut % 76.4 77.1 73.2 57.3 67.3  < > 71.9 56.0 49.3   Lymphocyte % 12.2 5.3 3.4 6.0 2.6  < > 2.6 5.3 7.6   Monocyte % 5.2 11.4 11.2 12.8 10.3  < > 0.9 0.0 2.5   Eosinophil % 0.9 0.0 0.9 0.0 0.9  < > 0.0 0.9 0.0   Basophil % 0.0 0.0 0.0 1.7 0.0  < > 0.0 0.0 0.0   Bands % _0 < > 10 18* 20*   Myelocyte % 1*  --  3* 3* 9*  < > 9* 4* 2*   Metamyelocyte % 3* 2* 5* 9* 6*  < > 4* 13* 17*   Promyelocyte %  --  1*  --   --  3*  --  2* 1*  --    Blasts % 1*  --   --   --   --   --   --  2* 2*   < > = values in this interval not displayed.            Lab results: 06/06/15  0206 06/05/15  1715 06/05/15  0126   Sodium 138 137 134   Potassium 3.8 3.9 3.7   Chloride 96 95* 93*   CO2 29* 29* 29*   UN _1 Creatinine 0.64 0.67 0.62   GFR,Caucasian 101 99 102   GFR,Black 116 114 117   Glucose 184* 197* 186*   Calcium 8.4* 8.5* 8.4*           Lab results: 06/06/15  0206   Magnesium 1.9       Lab Results   Component Value Date    ALT 15 06/06/2015    AST 12 06/06/2015     Lab Results   Component Value Date    ALK 52  06/06/2015     Lab Results   Component Value Date    TB 1.3 (H) 06/06/2015     LD   Date Value Ref Range Status   06/06/2015 184 118 - 225 U/L Final     LD, PL   Date Value Ref Range Status   05/09/2015 1146 (H) 118 - 225 U/L Final  LDL Calculated   Date Value Ref Range Status   08/27/2013 113 mg/dL Final     Comment:     REFERENCE RANGE:  < 100 Optimal                  100-129 Near or above optimal                  130-159 Borderline High                  160-189 High                    > 189 Very High

## 2015-06-06 NOTE — Plan of Care (Signed)
Comfort - BMT-related    • Nausea controlled or eliminated Maintaining    • Diarrhea controlled or eliminated Maintaining    • Without infection Maintaining        Graft Versus Host Disease (GVHD) Bundle    • Without rash Maintaining    • Stool output within parameters (</= 500ml in 24 h) Maintaining    • Total bilirubin </= 2 Maintaining        Mobility    • Patient's functional status is maintained or improved Maintaining        Pain/Comfort    • Patient's pain or discomfort is manageable Maintaining        Psychosocial    • Demonstrates ability to cope with illness Maintaining        Safety    • Patient will remain free of falls Maintaining          Nutrition    • Patient's nutritional status is maintained or improved Progressing towards goal

## 2015-06-07 ENCOUNTER — Other Ambulatory Visit: Payer: Self-pay | Admitting: Oncology

## 2015-06-07 DIAGNOSIS — Z9481 Bone marrow transplant status: Secondary | ICD-10-CM

## 2015-06-07 HISTORY — DX: Bone marrow transplant status: Z94.81

## 2015-06-07 LAB — DIFF MANUAL
Diff Based On: 115 CELLS
Metamyelocyte %: 3 % — ABNORMAL HIGH (ref 0–1)
Myelocyte %: 2 % — ABNORMAL HIGH (ref 0–0)
React Lymph %: 2 % (ref 0–6)

## 2015-06-07 LAB — COMPREHENSIVE METABOLIC PANEL
ALT: 10 U/L (ref 0–35)
AST: 12 U/L (ref 0–35)
Albumin: 3 g/dL — ABNORMAL LOW (ref 3.5–5.2)
Alk Phos: 44 U/L (ref 35–105)
Anion Gap: 13 (ref 7–16)
Bilirubin,Total: 1 mg/dL (ref 0.0–1.2)
CO2: 28 mmol/L (ref 20–28)
Calcium: 8.4 mg/dL — ABNORMAL LOW (ref 8.6–10.2)
Chloride: 100 mmol/L (ref 96–108)
Creatinine: 0.57 mg/dL (ref 0.51–0.95)
GFR,Black: 121 *
GFR,Caucasian: 105 *
Glucose: 158 mg/dL — ABNORMAL HIGH (ref 60–99)
Lab: 10 mg/dL (ref 6–20)
Potassium: 3.7 mmol/L (ref 3.3–5.1)
Sodium: 141 mmol/L (ref 133–145)
Total Protein: 4.8 g/dL — ABNORMAL LOW (ref 6.3–7.7)

## 2015-06-07 LAB — CBC AND DIFFERENTIAL
Baso # K/uL: 0 10*3/uL (ref 0.0–0.1)
Basophil %: 0 %
Eos # K/uL: 0 10*3/uL (ref 0.0–0.4)
Eosinophil %: 0.9 %
Hematocrit: 24 % — ABNORMAL LOW (ref 34–45)
Hemoglobin: 7.7 g/dL — ABNORMAL LOW (ref 11.2–15.7)
Lymph # K/uL: 0.5 10*3/uL — ABNORMAL LOW (ref 1.2–3.7)
Lymphocyte %: 9.6 %
MCH: 28 pg/cell (ref 26–32)
MCHC: 32 g/dL (ref 32–36)
MCV: 87 fL (ref 79–95)
Mono # K/uL: 0.3 10*3/uL (ref 0.2–0.9)
Monocyte %: 7 %
Neut # K/uL: 2.9 10*3/uL (ref 1.6–6.1)
Nucl RBC # K/uL: 0 10*3/uL (ref 0.0–0.0)
Nucl RBC %: 0.5 /100 WBC — ABNORMAL HIGH (ref 0.0–0.2)
Platelets: 149 10*3/uL — ABNORMAL LOW (ref 160–370)
RBC: 2.8 MIL/uL — ABNORMAL LOW (ref 3.9–5.2)
RDW: 18.8 % — ABNORMAL HIGH (ref 11.7–14.4)
Seg Neut %: 76.5 %
WBC: 3.8 10*3/uL — ABNORMAL LOW (ref 4.0–10.0)

## 2015-06-07 LAB — TACROLIMUS LEVEL: Tacrolimus: 9.7 ng/mL

## 2015-06-07 LAB — MAGNESIUM: Magnesium: 1.5 mEq/L (ref 1.3–2.1)

## 2015-06-07 MED ORDER — URSODIOL 300 MG PO CAPS *I*
300.0000 mg | ORAL_CAPSULE | Freq: Two times a day (BID) | ORAL | Status: DC
Start: 2015-06-07 — End: 2015-06-07
  Administered 2015-06-07: 300 mg via ORAL
  Filled 2015-06-07 (×3): qty 1

## 2015-06-07 MED ORDER — IPRATROPIUM-ALBUTEROL 20-100 MCG/ACT IN AERS *I*
1.0000 | INHALATION_SPRAY | Freq: Four times a day (QID) | RESPIRATORY_TRACT | 3 refills | Status: DC
Start: 2015-06-07 — End: 2015-08-10

## 2015-06-07 MED ORDER — LORAZEPAM 0.5 MG PO TABS *I*
0.2500 mg | ORAL_TABLET | Freq: Once | ORAL | Status: AC
Start: 2015-06-07 — End: 2015-06-07
  Administered 2015-06-07: 0.25 mg via ORAL
  Filled 2015-06-07: qty 1

## 2015-06-07 MED ORDER — GENERIC DME *A*
0 refills | Status: DC
Start: 2015-06-07 — End: 2015-07-01

## 2015-06-07 NOTE — Interdisciplinary Rounds (Incomplete)
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    Discharge Planning                 *Does patient currently have home care services?: No     *Current External Services: None                      Plan    05/14/15 Day -3.  Will receive Flu/Bu tonight.  Pt tolerating chemo well.  Continue supportive care.   5/7: doing well, continue supportive care  5/8:  Day -1.  Mild nausea, fatigue.  Continue supportive care.  05/17/2015 Day 0, WBC 3.3, ANC 3.2, Hct 23, plts 36, will receive MUD PBSCs today, continue supportive care.  05/18/2015 Day +1 WBC 3.9, ANC 3.7, hct 21, plts 52K, received MUD PBSCs yesterday, tolerated well, MTX given per protocol, continue supportive care.  05/19/15:  Day +2.  Counts dropping with ANC 2600 today.  Glucose control with BG's and SS insulin coverage d/t steroids.  Continue supportive care.  05/20/15 D +3  Labs stable/falling. Multiple episodes of vomiting yesterday, abd discomfort.   Continue supportive care.  5/13: Day +4. BMX for mucositis. Dizzy- 500 ml bolus and add daily prn bolus. Helmet/assist out of bed.  05/22/15: Day +5 today, c/o rectal pain after having diarrhea, given topicals to alleviate burning discomfort, feeling better today. Continue supportive care.  05/23/15:  Day +6.  Mild complaints of nausea and diarrhea.  Check stool for C-Diff and if negative will start  imodium.  Complaining of rectal pain - adjust medications to control discomfort.  Due fpr MTX today.  ANC 700,  Plt 7 K and hct 19 - transfused with both products today. Continue supportive care.  05/24/2015 Day +7 WBC 0.6, ANC 0.4, hct 17, plts 9K, transfused with plts, will receive PRBCs, complaining of mucusitis pain, not eating, encouraged pt to take pain medication, also needs encouragement to get OOB, otherwise continue supportive care.  05/25/2015 Day +8 WBC 0.2, ANC 0.1, hct 20, plts 15K, complaining of increased pain for which the dilaudid helps but does not last per pt, starting PCA, continue other supportive measures.  05/26/15:  Day +9, nadired.  Receiving blood and plts today.  Will stop BG's and SS insulin coverage.  Encourage patient increase activity.  Continue supportive care.  05/27/15: Day +10.  Nadired, plts 10K and hct 19.  T.bili today 6.1.  Ursodiol increased to tid.  U/S of liver today.  Check PT/PTT.  Mucositis worse, tongue very swollen.  Continue IVF to hydrate.  Change diflucan to caspofungin as may contribute to increasing T.bili further.  Change dilaudid PCA to Morphine d/t persistent nausea. Continue supportive care.  05/28/15: Day +11 T-bili still increased at 5.4. No MTX today.  Tachy to 150s this AM, bolus given. Still having persistent nausea, cont with antiemetics. Continue supportive care.  05/29/15: Day +12. HR still in the 150s, ANC 1100 today. CT angio done last night and ECHO to be done today. Decreasing fluids to 75m/hr. Pt states feeling better this morning. D/C scopolamine patch d/t urinary retention. Continue supportive care.   05/30/15:  Day +13.  Engrafting, WBC 3.0, ANC 1700.  T/bili down to 3.5.  Pt complains of SOB, increased pain in abdomen and throat.  HR continues to be sustained and elevated despite metoprolol.  Cardiology consult today, increase metoprolol to 25 mg q 6 hours with parameters.  Check KUB/CT abdomen.  Add empiric azithromycin. Change Morphine PCA to  dilaudid for better pain control.  Had foley in over weekend and was d/c'ed at pt request - will recheck post void residual.  Continue supportive care.  06/01/2015 Day +15 WBC 4.4, ANC 2.6, hct 23, plts 102K, changed from IV to po cardizem yesterday, sinus tacy 110's, will d/c tele today, continues on PCA with continued complaints of mucusitis pain, continues with edema and c/o SOB, will give lasix, 900 stool yesterday, thick but not watery, continue supportive care.  06/02/15:  Day +16.  Counts recovering.  Diarrhea resolving - will change imodium to prn.  Change tacro to po, d/c continuous PCA.  Have evaluate and treat.  Encourage po intake and try to get ready to discharge to home this week.  Continue supportive care.  06/03/15:  Day +17. Count recovery continues.  T.Bili 1.7 today.  Remains on oxygen for comfort, SOB on exertion.  Wt unchanged from yesterday, will give lasix today and wean oxygen.  Check chest xray.  Change caspofungin to diflucan.  Will remain on cefepime and flagyl till Monday to finish course for cholecystitis.  Continue supportive care.  06/04/15: Day +18. Counts stable. Oxygen for comfort continues, bloated, lasix 40 mg and re-check this afternoon. Telemetry. Continues with HR 120's-150's, will give IV metoprolol and re-consult cardiology.  Mg 1.0, will re-check this afternoon.   06/05/15: Day +19. Engrafted, antibiotics course done tomorrow, not requiring oxygen, but team concerned that her oxygen sats are still low, probably d/t fluid, but sending her for CTscan of her chest just to be safe. Continue supportive care.  06/06/15:  Day +20.  Plan for discharge tomorrow or Wednesday.  Needs IV Magnesium teach.  Remains on tele and needs oxygen at night for sats in mid 80's.  Will check pulse oxygenation overnight and ambulatory O2 sat.  Stop IV antibiotics today.  06/07/2015 Day +21 WBC 3.8, ANC 2.9, hct 24, plts 149K,         Anticipated Discharge Date:  Discharge Disposition: Home, choiced to UR

## 2015-06-07 NOTE — Progress Notes (Signed)
Nutrition     Patient's nutritional status is maintained or improved Goal not met          Comfort - BMT-related     Nausea controlled or eliminated Maintaining     Diarrhea controlled or eliminated Maintaining     Without infection Maintaining        Graft Versus Host Disease (GVHD) Bundle     Without rash Maintaining     Stool output within parameters (</= 548m in 24 h) Maintaining     Total bilirubin </= 2 Maintaining        Mobility     Patient's functional status is maintained or improved Maintaining        Pain/Comfort     Patient's pain or discomfort is manageable Maintaining        Psychosocial     Demonstrates ability to cope with illness Maintaining        Safety     Patient will remain free of falls Maintaining          (1900-0700) Pt. Oxygen desat to 85 on room air at 5 am, pt placed on 3L NC, pt sating at 96%, pt was able to be weaned to .5L NC sating at 95%. Pt had a run of Aflutter, provider aware, no new orders. OVSS, A&O x3.  Fall prevention teaching reinforced with patient, patient verbalized understanding.  Will continue to monitor and provide handoff to oncoming nurse for continuity of care.

## 2015-06-07 NOTE — Progress Notes (Signed)
Carly Higgins is a 55 y.o. 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 Patient was conditioned with fludarabine/busulfan (4 doses) followed by a 10/10 HLA matched unrelated allogeneic PBSCT. Immunosuppression in the hospital consisted of tacrolimus and methotrexate (15 mg/m2 day 1, 10 mg/m2 day 3, and 6). Patient received 3/4 methotrexate doses. Day +11 methotrexate was held due to hyperbilirubinemia and mucositis.     Current immunosuppression includes tacrolimus 2.5 mg BID. Prophylaxis includes acyclovir 400 mg BID and fluconazole 200 mg daily. PCP prophylaxis will be started as an outpatient.     Writer reviewed all medications with Ms. Cwikla and her parents, discussing medication indications, dosing, side effects and monitoring. A pill box was provided to the patient.    Emphasized to follow the transplant medication sheet for medication doses and to avoid following directions on prescription bottles. Instructed patient to take tacrolimus after lab work is drawn on lab days. Patient understood all information provided and asked appropriate questions.    Other medications include: diltiazem SR 12 hour caps 240 mg BID, metoprolol 25 mg BID, escitalopram 15 mg daily, duonebs QID, nicotine patch 14 mg/24hr, ondansetron prn    All questions were answered to the patients satisfaction and the patient had a good understanding of all that was presented.    Josefina Do, PharmD, BCOP

## 2015-06-07 NOTE — Progress Notes (Signed)
HOME CARE DISCHARGE PLAN    The following services have been arranged with [facility] [phone]:  __XX__Nursing   ______Physical Therapy   ______Occupational Therapy  ______Speech Therapist  ______Medical Social Worker  ______Home Health Aide Evaluation  ______Home Delivered Meals    The first home visit date for (CHN,PT ect) is scheduled for 06/08/2015 and pt/family are in agreement    The following medical equipment and/or supplies have been arranged thru [facility] for delivery on 06/07/2015:  ______walker  ______wheelchair  ______commode  ______Personal Emergency Response System  __XX__oxygen  ______cane  ______hospital bed  __XX__infusion supplies  __ ___tube feeding supplies  ______catheter supplies  ______ostomy supplies  ______wound care supplies  ______other:     Transportation home includes the following plan :  __XX__family/friend will transport patient home  ______ SW/ Care Coordinator aware that transport will need to be arranged    The patients identified backup/caregiver is Linnell Fulling, parents, [phone].        Home Care Coordinator:  Tennis Must, RN    Contact phone #:  [phone]    The above arrangements are based on an in-hospital evaluation. It is short-term and will be re-evaluated by the Home Health Nurse in the home on a regular basis, as per physicians order.

## 2015-06-07 NOTE — Progress Notes (Signed)
Physical Therapy Treatment Note:       06/07/15 1000   Prior Living    Medical Equipment in Home Rolling walker   Additional Comments reports parents house has a ramp to enter and she will have a first floor bedroom and bathroom.    PT Tracking   PT [place]   Visit Number   Visit Number [facility]) / Treatment Day ([place]) 2  (seen with RN permission)   Precautions/Observations   Precautions used Yes   LDA Observation Monitors  (for ambulatory saO2.)   Fall Precautions General falls precautions   Other In bed, lying down. Reports that she is going home today. States she's been here long enough. Feels she will have enough help at home with her parents. Declined need for home PT services - encouraged to work on gradually increasing walking as able. Also discussed strategies for energy conservation.    Pain Assessment   *Is the patient currently in pain? Denies   Cognition   Additional Comments alert and oriented. following all commands.    Bed Mobility   Bed mobility Tested   Supine to Sit Independent   Additional comments remained sitting at edge of bed at end of session.    Transfers   Transfers Tested   Sit to Stand Modified independent (device)   Stand to sit Modified independent (device)   Transfer Assistive Device rolling walker   Additional comments good techniques with walker noted.    Mobility   Mobility Tested   Gait Pattern Decreased cadence;Shuffle  (several standing rest breaks due to mild SOB. )   Ambulation Assist Stand by;Modified independent (device)   Ambulation Distance (Feet) 120'   Ambulation Assistive Device rolling walker   Additional comments saO2 with walking ranged from 92-95% on room air. HR ranged from 98-135. patient reports only mild SOB, cues for pursed lip breathing techniques.    Assessment   Brief Assessment Patient demonstrates adequate mobility skills to return home;Family able to provide necessary assistance with mobility and exercise program;Has met all goals and no further  acute PT indicated   Patient / Family Goal home today - will have assistance   Additional Comments Gradually improving endurance. Do not feel home PT is needed at this time but encouraged her to contact MD office if needs change for referral.   Plan/Recommendation   Treatment Interventions No further PT interventions   PT Frequency none further   Hospital Stay Recommendations May ambulate with family assist   Discharge Recommendations Anticipate return to prior living arrangement;No Home PT recommended at this time   PT Discharge Equipment Recommended (per patient has own rolling walker at her parents. )   Assessment/Recommendations Reviewed With: Nursing;Patient   Next Visit none further   Time Calculation   PT Timed Codes 17   PT Untimed Codes 0   PT Unbilled Time 0   PT Total Treatment 17   PT Charges   $PT [facility] Charges Gait Training - code [phone] (90mn)  (5' functional, 12' gait)   PT AM-PAC Mobility   Turning over in bed? 3   Sitting down on and standing up from a chair with arms? 3   Moving from lying on back to sitting on the side of the bed? 3   Moving to and from a bed to a chair? 4   Need to walk in hospital room? 4   Climbing 3 - 5 steps with a railing? 1   Total Raw Score 18  Standardized Score 43.63   CMS 1-100% Score [place], PT  Pager [phone]

## 2015-06-07 NOTE — Discharge Instructions (Addendum)
Brief summary of your hospital course:  You were admitted for an allogeneic bone marrow transplant. You tolerated conditioning regimen fairly well. You experienced the expected side effects of therapy including nausea/vomiting, diarrhea, and mucositis requiring a PCA.  You had increased abdominal pain/bloating, nausea/vomiting, and total bilirubin elevation with jaundice on 5/19.  [place] with Doppler revealed no reversal of flow but did show gallbladder sludge with distention and wall thickening.  You were started on broad-spectrum antibiotics.  On 5/20 you suddenly became tachycardic to the 160s, not responsive to fluid boluses or increased IV fluids. Your CXR at that time showed pulmonary edema.  You were given medications to help get extra fluid off, which helped. You had an echocardiogram with no abnormalities, and your chest CTwas negative for PE and showed likely pneumonia.  We consulted cardiology and you were started on a diltiazem drip for a-flutter with subsequent rate control and normal sinus rhythm on telemetry.  Your total bilirubin began to trend down however, and has since normalized. Your diltiazem drip was converted to oral medication.  You will have outpatient follow up with Cardiology.  Your shortness of breath greatly improved with rate control paired with diuresis, although you O2 sat remained as low as 90% on room air.  A repeat CT chest was obtained and revealed likely pulmonary edema as well as new RLL opacities; this will be followed up as an outpatient. You desaturated to 85% overnight while asleep, and overnight pulse oximetry was performed to evaluate possible need for home O2. You were discharged home with O2 via NC for use when sleeping and ambulating.  You were discharged to home with follow up scheduled in BMT clinic and cardiology service.       Special instructions for patients taking tacrolimus :  If taking tacrolimus , avoid grapefruit juice.  Tacrolimus come in different strengths;  please check to be sure you're taking the correct dose.  Hold your tacrolimus dose on days when a tacrolimus blood level will be drawn.   Take your tacrolimus dose immediately after the tacrolimus blood level has been drawn. (bring your dose with you if your blood will be drawn in clinic)    Post-discharge bloodwork:  Complete metabolic profile, magnesium, LD every twice weekly & as needed  CBC, differential, platelet count every twice weekly & as needed  Tacrolimus level every twice weekly  & as needed    Recommended diet:   Regular diet as tolerated  May need to avoid dairy products for a short time for lactose intolerance    Recommended activity:  Activity as tolerated  Advised to wear "duck bill" N95 mask to/from hospital appointments and around construction sites  ____________________________________________________________________  Discharge instructions:  Call the BMT office and ask for BMT coordinator at [phone] to report any of the following:   fever greater than 100.5   chills   chest pain    shortness of breath    persistent cough    nausea or vomiting uncontrolled by medications   persistent or severe diarrhea   blood in stool   other bleeding    uncontrolled pain   loss of consciousness   significant fatigue or weakness   poor feeding    new or worsening headache    new or worsening rash     If you cannot reach the BMT office personnel or if it is after office hours, call [phone] and ask for the hematology/oncology fellow on call. Be prepared to give the operator  your call back phone number.    If you have a medical emergency, call 911.    Other general information:  Drink at least 1&1/2 to 2-liters of fluid a day.   Notify the BMT office if you are unable to take your medications.   Avoid raw eggs, raw meats and raw seafood.   Please refer to your transplant discharge booklet for additional information.

## 2015-06-07 NOTE — Progress Notes (Signed)
Patient discharged home with VNS services for magnesium via IVAD infusions. Home oxygen therapy approved per as needed basis approved by insurance company as well, and to be delivered to her parent's home upon arrival. All discharge instructions, emergency hotline contact numbers, follow-up appointments, medication lists, explanation of stay, and insurance forms documented on and filed. Patient removed off telemetry, and ensured to have all belongings with her prior to discharge. Double accessed IVAD reduced down to a single huber need on the left port of the Right side of chest for infection prevention. Right line and port heparinized, de-accessed, and removed.  Patient transported via wheelchair to discharge lounge for safe exit from [facility].

## 2015-06-08 DIAGNOSIS — I472 Ventricular tachycardia, unspecified: Secondary | ICD-10-CM | POA: Insufficient documentation

## 2015-06-08 NOTE — Patient Instructions (Addendum)
will call with Tacro level   Start oral magnesium 1 tablet a day with a meal  Will follow up with home care to make sure have caps for line     Cont to use nicoderm patch and lozenges as needed for nicotine cravings  Tylenol as need for pain     June 2017   Sunday Monday Tuesday Wednesday Thursday Friday Saturday                       1     INJECTION    7:30 AM     FOLLOW UP     8:00 AM 2     3       4     5     6     INJECTION    8:30 AM     BONE MORROW BIOPSY    9:00 AM 7     8     9     INJECTION    8:00 AM     FOLLOW UP    8:30 AM 10       11     12     INJECTION    9:00 AM     FOLLOW UP    9:30 AM 13     14     FOLLOW UP    1:00 PM   Cardiology at [place] 15     INJECTION   10:00 AM     FOLLOW UP    10:30 AM 16     17       18     19     20     21     22     23     INJECTION    8:00 AM     FOLLOW UP    8:30 AM 24       25     26     27     28     29      INJECTION   10:00 AM     FOLLOW UP    10:30 AM 30

## 2015-06-08 NOTE — Discharge Summary (Signed)
Name: Carly Higgins MRN: [id number] DOB: [birth year]     Admit Date: 05/11/2015     Patient was accepted for discharge to   Home or Self Care [1]                  Hospitalization Summary    CONCISE NARRATIVE: Carly Higgins is a 55 y.o. female with myeloproliferative disorder who was admitted to undergo busulfan/fludarabine conditioning followed by a MUD allogeneic PBSCT on 05/17/2015.  The patient experienced expected side effects of treatment including nausea/vomiting, diarrhea, and mucositis requiring a PCA.  Patient had increased abdominal pain/bloating, nausea/vomiting, and total bilirubin elevation with jaundice on 5/19.  [place] with Doppler revealed no reversal of flow but did show gallbladder sludge with distention and wall thickening.  She was started on broad-spectrum antibiotics.  On 5/20 she suddenly became tachycardic to the 160s, not responsive to fluid boluses or increased IV fluids.  Cultures had no growth to date, and CXR at that time showed pulmonary edema.  She was diuresed without effect on her heart rate, and metoprolol was started and then increased without effect.  On 5/21 she became hypoxic, fluids were stopped and she was further diuresed.  Echo with no abnormalities, CTA was negative for PE and showed likely pneumonia.  Cardiology was consulted and patient was started on diltiazem drip for a-flutter with subsequent rate control and normal sinus rhythm on telemetry.  She continued with severe abdominal pain/distention, and CT A/P revealed ascites and gallbladder sludge with distention/wall thickening.  Her total bilirubin began to trend down however, and has since normalized.  Diuresis was continued during this time with good urine output and improved shortness of breath.  Her diltiazem drip was converted to oral with maintained rate control for about 3 days, but unfortunately she became tachycardic to the 150s-160s again overnight 5/26.  IV metoprolol was given several times at wide intervals with no  lasting effect.  Cardiology was re-consulted and increased her oral diltiazem dose with good effect.  She had an episode of rate-controlled a-flutter on 5/29 with a short run of asymptomatic VT; Cardiology was made aware and gave no change in recommendations.  She will have outpatient follow up with Cardiology.  Patient's shortness of breath greatly improved with rate control paired with diuresis, although her O2 sat remained as low as 90% on room air.  CT chest was obtained and revealed likely pulmonary edema as well as new RLL opacities; consider repeat imaging as outpatient to follow these changes.  Patient desaturated to 85% overnight while asleep, and overnight pulse oximetry was performed to evaluate possible need for home O2.  You were discharged home with O2 to use when ambulating and sleeping.   She was discharged to home with follow up scheduled in BMT clinic and outpatient cardiology.       CT RESULTS:   CT chest noncon 5/28:  There are groundglass opacities throughout the bilateral lungs with a   predominance in the right upper lobe concerning for   infection/inflammation. Since the prior CT, some of these have   improved, although there are some new irregular nodular opacities in   the right lung, for instance in the right lower lobe.    Interval development of small left pleural effusion. Stable small   right pleural effusion.    New trace pericardial effusion.      CT A/P with contrast 5/22:  1. Distended gallbladder with thickened wall. No gallstones or bile  duct dilatation identified. The patient is nontender on the prior   ultrasound. This may represent a calculus cholecystitis. Further   evaluation could include a hepatobiliary nuclear medicine scan for   further evaluation.    2. Moderate amounts of ascites within the pelvis.    3. Mild subcutaneous edema    4. Groundglass opacification of both lung bases right greater than   left. Small right pleural effusion. This may be on the basis  of   pulmonary edema. The differential diagnosis includes generalized   edema and infections, including atypical infections.    5. Colonic diverticulosis without evidence of diverticulitis.          ULTRASOUND RESULTS:   US abdomen 5/19:  Normal liver appearance without focal hepatic lesions. Patent hepatic   and portal vasculature.     Gallbladder sludge with distention and wall thickening without   tenderness in the right upper quadrant.    Splenomegaly.        SIGNIFICANT MED CHANGES: Yes  Diltiazem 12 hr capsules 240mg  PO every 12 hours  Metoprolol 50mg  PO every 12 hours  Tacrolimus 2.5mg  BID   Oxygen 2L via NC- use when sleeping, ambulating and/or feeling short of breath. Can titrate between 1-6 LPM  Albuterol/Ipatropium inhaler 1 puff four times a day    Nicotine 24mg /24hr patch, lozenges     Acyclovir 400mg  BID   Diflucan 200mg  nightly   Pantoprazole 40mg  daily        [place]    Cardiology                  Signed: Vivianne Master, NP  On: 06/08/2015  at: 5:13 PM

## 2015-06-09 ENCOUNTER — Other Ambulatory Visit
Admission: RE | Admit: 2015-06-09 | Discharge: 2015-06-09 | Disposition: A | Discharge status: Home / Self Care | Payer: Self-pay | Source: Ambulatory Visit

## 2015-06-09 ENCOUNTER — Ambulatory Visit: Payer: Self-pay

## 2015-06-09 ENCOUNTER — Encounter: Payer: Self-pay | Admitting: Oncology

## 2015-06-09 ENCOUNTER — Ambulatory Visit: Payer: Self-pay | Admitting: Oncology

## 2015-06-09 VITALS — BP 126/70 | HR 95 | Temp 99.1°F | Resp 18 | Ht 65.75 in | Wt 263.9 lb

## 2015-06-09 DIAGNOSIS — Z9481 Bone marrow transplant status: Secondary | ICD-10-CM

## 2015-06-09 DIAGNOSIS — R0902 Hypoxemia: Secondary | ICD-10-CM

## 2015-06-09 DIAGNOSIS — D72829 Elevated white blood cell count, unspecified: Secondary | ICD-10-CM

## 2015-06-09 DIAGNOSIS — D471 Chronic myeloproliferative disease: Secondary | ICD-10-CM

## 2015-06-09 DIAGNOSIS — Z9981 Dependence on supplemental oxygen: Secondary | ICD-10-CM

## 2015-06-09 DIAGNOSIS — Z7409 Other reduced mobility: Secondary | ICD-10-CM

## 2015-06-09 DIAGNOSIS — R600 Localized edema: Secondary | ICD-10-CM

## 2015-06-09 DIAGNOSIS — D849 Immunodeficiency, unspecified: Secondary | ICD-10-CM

## 2015-06-09 DIAGNOSIS — Z789 Other specified health status: Secondary | ICD-10-CM

## 2015-06-09 LAB — CBC AND DIFFERENTIAL
Baso # K/uL: 0.2 10*3/uL — ABNORMAL HIGH (ref 0.0–0.1)
Basophil %: 4.4 %
Eos # K/uL: 0.2 10*3/uL (ref 0.0–0.4)
Eosinophil %: 3.5 %
Hematocrit: 27 % — ABNORMAL LOW (ref 34–45)
Hemoglobin: 8.6 g/dL — ABNORMAL LOW (ref 11.2–15.7)
Lymph # K/uL: 0.3 10*3/uL — ABNORMAL LOW (ref 1.2–3.7)
Lymphocyte %: 7 %
MCH: 28 pg/cell (ref 26–32)
MCHC: 32 g/dL (ref 32–36)
MCV: 88 fL (ref 79–95)
Mono # K/uL: 0.7 10*3/uL (ref 0.2–0.9)
Monocyte %: 15.8 %
Neut # K/uL: 2.9 10*3/uL (ref 1.6–6.1)
Nucl RBC # K/uL: 0 10*3/uL (ref 0.0–0.0)
Nucl RBC %: 0.7 /100 WBC — ABNORMAL HIGH (ref 0.0–0.2)
Platelets: 138 10*3/uL — ABNORMAL LOW (ref 160–370)
RBC: 3 MIL/uL — ABNORMAL LOW (ref 3.9–5.2)
RDW: 19.8 % — ABNORMAL HIGH (ref 11.7–14.4)
Seg Neut %: 67.5 %
WBC: 4.2 10*3/uL (ref 4.0–10.0)

## 2015-06-09 LAB — EKG 12-LEAD
QRS: 73 degrees
QRS: 64 degrees
QRS: 79 degrees
Rate: 131 {beats}/min
Rate: 91 {beats}/min
Rate: 80 {beats}/min
Severity: ABNORMAL
Severity: ABNORMAL
Severity: ABNORMAL
Severity: ABNORMAL
Severity: ABNORMAL
Severity: ABNORMAL
Statement: ABNORMAL
Statement: BORDERLINE
Statement: ABNORMAL
T: 172 degrees
T: 22 degrees
T: 71 degrees

## 2015-06-09 LAB — CHIM REVIEW

## 2015-06-09 LAB — TYPE AND SCREEN
ABO RH Blood Type: A POS
Antibody Screen: NEGATIVE

## 2015-06-09 LAB — COMPREHENSIVE METABOLIC PANEL, PL
ALT, PL: 12 U/L (ref 0–35)
AST, PL: 17 U/L (ref 0–35)
Albumin, PL: 3.6 g/dL (ref 3.5–5.2)
Alk Phos, PL: 47 U/L (ref 35–105)
Anion Gap,PL: 13 (ref 7–16)
Bilirubin Total, PL: 1.2 mg/dL (ref 0.0–1.2)
CO2,Plasma: 29 mmol/L — ABNORMAL HIGH (ref 20–28)
Calcium, PL: 9.7 mg/dL (ref 8.6–10.2)
Chloride,Plasma: 100 mmol/L (ref 96–108)
Creatinine: 0.59 mg/dL (ref 0.51–0.95)
GFR,Black: 119 *
GFR,Caucasian: 104 *
Glucose,Plasma: 145 mg/dL — ABNORMAL HIGH (ref 60–99)
Potassium,Plasma: 5 mmol/L — ABNORMAL HIGH (ref 3.4–4.7)
Sodium,Plasma: 142 mmol/L (ref 133–145)
Total Protein, PL: 6.3 g/dL (ref 6.3–7.7)
UN,Plasma: 10 mg/dL (ref 6–20)

## 2015-06-09 LAB — DIFF MANUAL
Bands %: 2 % (ref 0–10)
Diff Based On: 114 CELLS

## 2015-06-09 LAB — CHIMERISM

## 2015-06-09 LAB — NEUTROPHIL #-INSTRUMENT: Neutrophil #-Instrument: 2.6 10*3/uL

## 2015-06-09 LAB — MAGNESIUM, PLASMA: Magnesium, PL: 1.4 mEq/L (ref 1.3–2.1)

## 2015-06-09 LAB — LD, PL: LD, PL: 195 U/L (ref 118–225)

## 2015-06-09 LAB — PHOSPHORUS: Phosphorus: 4.5 mg/dL (ref 2.7–4.5)

## 2015-06-09 LAB — TACROLIMUS LEVEL: Tacrolimus: 9.9 ng/mL

## 2015-06-09 LAB — IGG: IgG: 755 mg/dL (ref 700–1600)

## 2015-06-09 LAB — MULTIPLE ORDERING DOCS

## 2015-06-09 NOTE — Patient Instructions (Addendum)
June 2017   Sunday Monday Tuesday Wednesday Thursday Friday Saturday                       1   2     3       4     5     6   7     8     9   10       11     12   13     14   15   16     17       18     19     20     21     22     23     INJECTION    8:00 AM     FOLLOW UP     8:30 AM 24       25     26     27     28     29     INJECTION   10:00 AM     FOLLOW UP    10:30 AM 07 August 2015   Sunday Monday Tuesday Wednesday Thursday Friday Saturday                                 1       2     3     4     5     INJECTION    8:30 AM     FOLLOW UP     9:00 AM 6     7     8       9     10     11     12     13     INJECTION   11:15 AM     FOLLOW UP    11:30 AM 14     15       16     17     18     INJECTION   10:00 AM      FOLLOW UP    10:30 AM 19     20     21     22       23     24     25     26     27     28     INJECTION   11:00 AM     FOLLOW UP    11:30 AM 29       30     08 September 2015   Sunday Monday Tuesday Wednesday Thursday Friday Saturday              1     2     3      INJECTION   10:30 AM     FOLLOW UP    11:00 AM 4     5  6     7     8     9     10     11      INJECTION   10:00 AM     FOLLOW UP    10:30 AM 12       13     14     15     16     17     18      INJECTION   10:00 AM     FOLLOW UP    10:30 AM 19       20     21     22     23      INJECTION    9:30 AM     FOLLOW UP    10:00 AM 24     25     26       27     28     29     30     31      INJECTION   10:30 AM     FOLLOW UP    11:00 AM                    Increase protonix (pantoprazole) to TWICE daily; in the morning BEFORE breakfast and in the evening BEFORE dinner

## 2015-06-09 NOTE — Progress Notes (Signed)
Author called by Irene Pap NP to have needle and dressing changed. Per provider pt went to lab to have her labs drawn. Pt's outer port of IVAD accessed today. Positive blood return noted, flushing well without issues. Pt remains accessed for home infusions. No further issues, pt discharged home.

## 2015-06-09 NOTE — Progress Notes (Signed)
[facility] called and asked to reinforce the use of microclave on IVAD, patient arrived to clinic today without one.  They will also verify correct supplies in home.

## 2015-06-09 NOTE — Progress Notes (Signed)
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 May have OSA.     Carly Higgins comes to BMT clinic today for follow up after recent discharge from BMT unit 06/07/15    PMH, Oncology History & Problem List  Reviewed and updated as needed    Interval History/Review of Systems  BMT admission 5/3, discharged 1/61  Course complicated with expected side effects as well as tachycardia, pulm edema requiring diuresis and Cardiology consult, currently on cardiazem and metoprolol   Hypoxia requiring oxygen use, now just at night, improved after diuresis too. Eventually planning to do sleep study as OP   Since home feels has been doing well   Having some SOB mostly with activity  Has not had to use inhaler much  Using oxygen only at night with sleep  Leg edema slowing improving  Moving around more  Came up in wheelchair as to able to walk very far yet   Denies N/V or diarrhea  No constipation   No dysuria   Fatigue  Mild sore throat   Confused where to go this am for lab work   Surveyor, minerals to phlebotomy vs BMT pod  Also doing IV magnesium at home - disconnected line but no caps at home so need left open overnight - will change needle today and give caps and call home care      Other pertinent findings checked/documented below     A comprehensive review of 13 systems was performed; pertinent positives checked below:  _0  fevers  _1  fast pulse _2  decreased appetite  _3  weakness   _4  chills _5  chest pain _6  altered taste  _7  poor mobility    _8  dry eyes _9  nausea _10  insufficient oral intake  _11  impaired ADLs improving   _12  visual changes  _13  emesis  _14  skin changes _15  insomnia    _16  headache   _17  abdominal cramping  _18  rash  _19  leg edema   _20  sores in mouth _21  abdominal discomfort _22  urinary frequency _23     _24  sore throat mild dry  _25  diarrhea, watery _26  dysuria and hematuria  _27  anxiety    _28  cough   _29  loose stool, non-formed _30  bleeding  _31  poor coping    _32  sputum production  _33  constipation   _34  pain _35  neuropathy   _36  shortness of breath   _37   weight loss _38  fatigue  _39  none of the above        Medications  Reviewed medications with patient today & electronically reconciled  Current Outpatient Prescriptions   Medication Sig    generic DME Use 2 lpm of oxygen via nasal cannula continuously    acyclovir (ZOVIRAX) 400 MG tablet Take 1 tablet (400 mg total) by mouth 2 times daily    diltiazem (CARDIZEM SR) 120 MG 12 hr capsule Take 2 capsules (240 mg total) by mouth 2 times daily    fluconazole (DIFLUCAN) 200 MG tablet Take 1 tablet (200 mg total) by mouth nightly    metoprolol (LOPRESSOR) 50 MG tablet Take 1 tablet (50 mg total) by mouth 2 times daily  nicotine (NICODERM CQ) 14 MG/24HR patch Place 1 patch onto the skin daily   Remove & discard patch after 24 hours.    pantoprazole (PROTONIX) 40 MG EC tablet Take 1 tablet (40 mg total) by mouth every morning   Swallow whole. Do not crush, break, or chew.    generic DME Magnesium sulfate 4 gm mixed in 573m NS infuse via central over 2-4 hours daily.    tacrolimus (PROGRAF) 1 MG capsule Take 3 capsules (3 mg total) by mouth 2 times daily   And as directed by MD/NP (Patient taking differently: Take 2 mg by mouth 2 times daily   And as directed by MD/NP)    tacrolimus (PROGRAF) 0.5 MG capsule Take 1 capsule (0.5 mg total) by mouth 2 times daily   And as directed by MD/NP    heparin lock flush 10 UNIT/ML injection 5 mLs (50 Units total) by Intracatheter route daily as needed for Line Care    sodium chloride 0.9 % flush 10 mLs by Intracatheter route as needed (10 mls by intrachatheter route as needed)    escitalopram (LEXAPRO) 10 MG tablet  Take 15 mg by mouth daily    albuterol-ipratropium (COMBIVENT RESPIMAT) 20-100 MCG/ACT inhaler Inhale 1 puff into the lungs 4 times daily    nicotine polacrilex (COMMIT) 2 MG lozenge No more than 5 lozenges in 6 hours or 20 lozenges per 24 hours    ondansetron (ZOFRAN) 4 MG tablet Take 1 tablet (4 mg total) by mouth 3 times daily as needed (nausea)    prochlorperazine (COMPAZINE) 10 MG tablet Take 1 tablet (10 mg total) by mouth 4 times daily as needed for Nausea     No current facility-administered medications for this visit.          Recent labs - reviewed  No results found for this or any previous visit (from the past 24 hour(s)).    Recent Results (from the past 72 hour(s))   Multiple ordering docs    Collection Time: 06/09/15  7:35 AM   Result Value Ref Range    Mult Providers see below    Tacrolimus level    Collection Time: 06/09/15  7:43 AM   Result Value Ref Range    Tacrolimus 9.9 ng/mL   Phosphorus    Collection Time: 06/09/15  7:43 AM   Result Value Ref Range    Phosphorus 4.5 2.7 - 4.5 mg/dL   IgG    Collection Time: 06/09/15  7:43 AM   Result Value Ref Range    IgG 755 700 - 1600 mg/dL   Type and screen    Collection Time: 06/09/15  7:43 AM   Result Value Ref Range    ABO RH Blood Type A RH POS     Antibody Screen Negative    CBC and differential    Collection Time: 06/09/15  7:43 AM   Result Value Ref Range    WBC 4.2 4.0 - 10.0 THOU/uL    RBC 3.0 (L) 3.9 - 5.2 MIL/uL    Hemoglobin 8.6 (L) 11.2 - 15.7 g/dL    Hematocrit 27 (L) 34 - 45 %    MCV 88 79 - 95 fL    MCH 28 26 - 32 pg/cell    MCHC 32 32 - 36 g/dL    RDW 19.8 (H) 11.7 - 14.4 %    Platelets 138 (L) 160 - 370 THOU/uL    Seg Neut % 67.5 %    Lymphocyte %  7.0 %    Monocyte % 15.8 %    Eosinophil % 3.5 %    Basophil % 4.4 %    Neut # K/uL 2.9 1.6 - 6.1 THOU/uL    Lymph # K/uL 0.3 (L) 1.2 - 3.7 THOU/uL    Mono # K/uL 0.7 0.2 - 0.9 THOU/uL    Eos # K/uL 0.2 0.0 - 0.4 THOU/uL    Baso # K/uL 0.2 (H) 0.0 - 0.1 THOU/uL    Nucl RBC % 0.7 (H) 0.0 -  0.2 /100 WBC    Nucl RBC # K/uL 0.0 0.0 - 0.0 THOU/uL   Magnesium, Plasma    Collection Time: 06/09/15  7:43 AM   Result Value Ref Range    Magnesium, PL 1.4 1.3 - 2.1 mEq/L   Comprehensive Metabolic Panel, PL    Collection Time: 06/09/15  7:43 AM   Result Value Ref Range    Potassium,Plasma 5.0 (H) 3.4 - 4.7 mmol/L    Sodium,Plasma 142 133 - 145 mmol/L    Anion Gap,PL 13 7 - 16    UN,Plasma 10 6 - 20 mg/dL    Creatinine 0.59 0.51 - 0.95 mg/dL    GFR,Caucasian 104 *    GFR,Black 119 *    Glucose,Plasma 145 (H) 60 - 99 mg/dL    Calcium, PL 9.7 8.6 - 10.2 mg/dL    Chloride,Plasma 100 96 - 108 mmol/L    CO2,Plasma 29 (H) 20 - 28 mmol/L    Alk Phos, PL 47 35 - 105 U/L    AST, PL 17 0 - 35 U/L    ALT, PL 12 0 - 35 U/L    Albumin, PL 3.6 3.5 - 5.2 g/dL    Bilirubin Total, PL 1.2 0.0 - 1.2 mg/dL    Total Protein, PL 6.3 6.3 - 7.7 g/dL   LD, PL    Collection Time: 06/09/15  7:43 AM   Result Value Ref Range    LD, PL 195 118 - 225 U/L   Neutrophil #-Instrument    Collection Time: 06/09/15  7:43 AM   Result Value Ref Range    Neutrophil #-Instrument 2.6 THOU/uL   Diff manual    Collection Time: 06/09/15  7:43 AM   Result Value Ref Range    Bands % 2 0 - 10 %    Giant PLTs Present     Manual DIFF RESULTS     Diff Based On 114 CELLS         Wt Readings from Last 3 Encounters:   06/09/15 119.7 kg (263 lb 14.3 oz)   06/06/15 117.9 kg (260 lb)   05/05/15 119.5 kg (263 lb 7.2 oz)     Radiology impressions (last 3 days):  No results found.        Physical Exam   BP 126/70 (BP Location: Right arm, Patient Position: Sitting, Cuff Size: large adult)  Pulse 95  Temp 37.3 C (99.1 F) (Temporal)   Resp 18  Ht 167 cm (5' 5.75")  Wt 119.7 kg (263 lb 14.3 oz)  SpO2 96%  BMI 42.92 kg/m2  KPS  60-70%  Pain 0/10   Came up in wheel chair  No acute distress, non-toxic appearing, cooperative with exam, mildly tired appearing   Appropriate affect and dress with normal speech and motor activity  scalp atraumatic  PERRLA- EOMI. Conjunctiva & corneas  clear, no eye drainage. Sclera anicteric OU  Lips, oral mucosa and tongue moist, without lesions; no postpharyngeal exudate  Dentition good repair   Neck is supple, no rigidity   Lymph nodes: No cervical, submandibular, supraclavicular, infraclavicular, axillary, or inguinal  Normal S1 & S2, regular, no murmur, rub, or gallop  Respirations unlabored, lung sounds clear to A&P bilateral auscultation  Bowel sounds are normoactive in four quadrants; abdomen is soft, non-tender, and without masses or organomegaly  Radial pulses +2 and symmetric, CMS is intact  +2 to +3 bilat lower extremity edema  Neuro AOx3, speech/language WNL, moving all extremities, strength/sensation grossly intact. Normal broad based gait. Non-focal exam  Skin warm dry intact no lesions, No rash  Central linesRight mediport NT site clean dry . End of extension tubing without cap    Assessment   Pleasant  55 yo female with MPN post allogeneic MUD (F) PBSCT, here for follow up. No clinical signs of infection or GVHD. Breathing and HR stable, has cardiology follow up in 2 weeks. Recovering as expected early in BMT course.     Plan  MPN post HSCT  Regimen: Fludarabine 40 mg/m2/d and Busulfan 130 mg/m2/d x 4 days   Transplant type: Allogeneic MUD PBSC 10/10 55 yr old female ABO : A+/O\A+. CMV titer:N/N  Transplant day 0:  05/17/15  Transplant day:  + 23  Day 30 marrow scheduled 6/6    Hematology:   Blood Counts stable mild anemia and thrombocytopenia   No signs of bleeding.  No transfusion  Central line Mediport site clean and dry - extesion tubing without auto clave on end, open   change needle today in infusion    GvHD:   Donor MUD (female)  Completed all doses of MTX   Tacrolimus 2.5 mg BID   Goal levels 8-12 as tolerated.  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   Renal/FEN:   Creat WNL 0.59  Daily IV magnesium 4g/daily- wean to 2 g/day as able   Magnesium level 1.4  Start oral magnesium 1 tab with a meal daily titrate up as tolerated   No dysuria or hematuria  Other lytes stable  Weight up a little from discharge  May need additional diuretics- follow     Gastrointestinal:   No N/V   No diarrhea.   Cont protonix   Liver function tests stable WNL  Eating well and drinking fair  Mucositis resolving   Mild  sore throat   May be oxygen at night - tylenol PRN   No oral ulcers no redness     Endocrine:   Stable.     Neurologic:   Nonfocal; no deficits.   No H/A or visual changes.   Monitor for neuro changes on [place]   No pain.   Adjust analgesics as needed    Musculoskeletal:   Encourage activity as tolerated   debility noted and expect improvement over time   Up in wheelchair  Walking short distances   Fatigue expected to improve over time     Derm/eyes:  No eye problems  Alopecia   Skin intact  No rashes    Anticipate additional skin changes from radiation and chemotherapy will monitor skin for changes and provide appropriate management    Psychosocial:   Family supportive.   Continue our support.   Marland Kitchen   BMT attending L[place]le Butte    BMT coordinator [place]   PCP Cahn[place]idalgo  Adjust plan of care as needed.     Follow up  BMT clinic twice weekly for next 1-2 weeks and PRN  Labs twice weekly (Usually M/Th)      Irene Pap, NP  06/09/15  9:19 AM

## 2015-06-09 NOTE — Patient Instructions (Addendum)
June 2017   Sunday Monday Tuesday Wednesday Thursday Friday Saturday                       1   2     3       4     5     6   7     8     9   10       11     12   13     14   15     INJECTION   10:00 AM     FOLLOW UP    10:30 AM 16     17       18     19     20     21     22     23     INJECTION    8:00 AM     FOLLOW UP     8:30 AM 24       25     26     27     28     29     INJECTION   10:00 AM     FOLLOW UP    10:30 AM 07 August 2015   Sunday Monday Tuesday Wednesday Thursday Friday Saturday                                 1       2     3     4     5     INJECTION    8:30 AM     FOLLOW UP     9:00 AM 6     7     8       9     10     11     12     13     INJECTION   11:15 AM     FOLLOW UP    11:30 AM 14     15       16     17     18     INJECTION   10:00 AM      FOLLOW UP    10:30 AM 19     20     21     22       23     24     25     26     27     28     INJECTION   11:00 AM     FOLLOW UP    11:30 AM 29       30     31 

## 2015-06-09 NOTE — Patient Instructions (Addendum)
June 2017   Sunday Monday Tuesday Wednesday Thursday Friday Saturday                       1   2     3       4     5     6   7     8     9   10       11     12   13     14   15     INJECTION   10:00 AM     FOLLOW UP    10:30 AM 16     17       18     19     20     21     22     23     INJECTION    8:00 AM     FOLLOW UP     8:30 AM 24       25     26     27     28     29     INJECTION   10:00 AM     FOLLOW UP    10:30 AM 07 August 2015   Sunday Monday Tuesday Wednesday Thursday Friday Saturday                                 1       2     3     4     5     INJECTION    8:30 AM     FOLLOW UP     9:00 AM 6     7     8       9     10     11     12     13     INJECTION   11:15 AM     FOLLOW UP    11:30 AM 14     15       16     17     18     INJECTION   10:00 AM      FOLLOW UP    10:30 AM 19     20     21     22       23     24     25     26     27     28     INJECTION   11:00 AM     FOLLOW UP    11:30 AM 29       30     08 September 2015   Sunday Monday Tuesday Wednesday Thursday Friday Saturday              1     2     3      INJECTION   10:30  AM     FOLLOW UP    11:00 AM 4     5       6     7     8     9     10     11      INJECTION   10:00 AM     FOLLOW UP    10:30 AM 12       13     14     15     16     17     18      INJECTION   10:00 AM     FOLLOW UP    10:30 AM 19       20     21     22     23      INJECTION    9:30 AM     FOLLOW UP    10:00 AM 24     25     26       27     28     29     30     31      INJECTION   10:30 AM     FOLLOW UP    11:00 AM                    Increase Magnesium to two pills twice daily. Continue IV Magnesium every other day- we may be able to discontinue the IV next week.    Take Zofran in the morning when you wake up to help prevent nausea. Wait for about a half hour before taking breakfast and other meds.    Start Florinef to help regulate the Potassium. Push oral fluids. Avoid potassium rich foods- bananas, potatoes, orange juice,  sports drinks.    We will ask home care to visit on Monday for labs and will cancel tomorrow.    We recommend Eucerin, Aquaphor for dry skin.

## 2015-06-10 NOTE — Patient Instructions (Addendum)
June 2017   Sunday Monday Tuesday Wednesday Thursday Friday Saturday                       1     2     3       4     5     6     INJECTION    8:30 AM     BONE MORROW BIOPSY    9:00 AM 7     8     9     INJECTION    8:00 AM     FOLLOW UP    8:30 AM 10       11     12     INJECTION    9:00 AM     FOLLOW UP    9:30 AM 13     14     FOLLOW UP    1:00 PM   [facility]iology at [place] 15     INJECTION   10:00 AM     FOLLOW UP    10:30 AM 16     17       18     19     20     21     22     23     INJECTION    8:00 AM     FOLLOW UP    8:30 AM 24       25     26     27     28     29      INJECTION   10:00 AM     FOLLOW UP    10:30 AM 30                   Magnesium infusion every other day  Increase magnesium pills to 3 tablets daily

## 2015-06-11 ENCOUNTER — Encounter: Payer: Self-pay | Admitting: Oncology

## 2015-06-11 DIAGNOSIS — R0902 Hypoxemia: Secondary | ICD-10-CM

## 2015-06-11 DIAGNOSIS — Z9981 Dependence on supplemental oxygen: Secondary | ICD-10-CM | POA: Insufficient documentation

## 2015-06-11 DIAGNOSIS — Z7409 Other reduced mobility: Secondary | ICD-10-CM | POA: Insufficient documentation

## 2015-06-11 DIAGNOSIS — Z789 Other specified health status: Secondary | ICD-10-CM | POA: Insufficient documentation

## 2015-06-11 DIAGNOSIS — D849 Immunodeficiency, unspecified: Secondary | ICD-10-CM | POA: Insufficient documentation

## 2015-06-11 HISTORY — DX: Hypoxemia: R09.02

## 2015-06-11 HISTORY — DX: Other reduced mobility: Z78.9

## 2015-06-11 HISTORY — DX: Other reduced mobility: Z74.09

## 2015-06-13 NOTE — Patient Instructions (Addendum)
Plan   1)  Stop the mycelex troche tablets     2) Start the dexamethasone mouth rinse swish around sides of tongue for 3-5 minutes then spit out three times a day     3) Start Colace 1 capsule twice a day if  No bowel movement take senna 1-2 tablets nightly        June 2017   Sunday Monday Tuesday Wednesday Thursday Friday Saturday                       1     2     3       4     5     6     7     8     9   10       11     12     INJECTION    9:00 AM     FOLLOW UP    9:30 AM 13     14     FOLLOW UP    1:00 PM   Cardiology at [place] 15     INJECTION   10:00 AM     FOLLOW UP    10:30 AM 16     17       18     19     20     21     22     23     INJECTION    8:00 AM     FOLLOW UP    8:30 AM 24       25     26     27     28     29      INJECTION   10:00 AM     FOLLOW UP    10:30 AM 30

## 2015-06-14 ENCOUNTER — Ambulatory Visit: Payer: Self-pay

## 2015-06-14 ENCOUNTER — Encounter: Payer: Self-pay | Admitting: Oncology

## 2015-06-14 ENCOUNTER — Ambulatory Visit: Payer: Self-pay | Admitting: Oncology

## 2015-06-14 ENCOUNTER — Telehealth: Payer: Self-pay | Admitting: Oncology

## 2015-06-14 ENCOUNTER — Telehealth: Payer: Self-pay | Admitting: Hematology

## 2015-06-14 DIAGNOSIS — D471 Chronic myeloproliferative disease: Secondary | ICD-10-CM

## 2015-06-14 DIAGNOSIS — E875 Hyperkalemia: Secondary | ICD-10-CM

## 2015-06-14 DIAGNOSIS — R239 Unspecified skin changes: Secondary | ICD-10-CM

## 2015-06-14 DIAGNOSIS — Z9481 Bone marrow transplant status: Secondary | ICD-10-CM

## 2015-06-14 DIAGNOSIS — K143 Hypertrophy of tongue papillae: Secondary | ICD-10-CM | POA: Insufficient documentation

## 2015-06-14 DIAGNOSIS — T451X5A Adverse effect of antineoplastic and immunosuppressive drugs, initial encounter: Secondary | ICD-10-CM

## 2015-06-14 DIAGNOSIS — D849 Immunodeficiency, unspecified: Secondary | ICD-10-CM

## 2015-06-14 LAB — COMPREHENSIVE METABOLIC PANEL, PL
ALT, PL: 31 U/L (ref 0–35)
AST, PL: 36 U/L — ABNORMAL HIGH (ref 0–35)
Albumin, PL: 4.2 g/dL (ref 3.5–5.2)
Alk Phos, PL: 62 U/L (ref 35–105)
Anion Gap,PL: 18 — ABNORMAL HIGH (ref 7–16)
Bilirubin Total, PL: 1.2 mg/dL (ref 0.0–1.2)
CO2,Plasma: 22 mmol/L (ref 20–28)
Calcium, PL: 10.5 mg/dL — ABNORMAL HIGH (ref 8.6–10.2)
Chloride,Plasma: 100 mmol/L (ref 96–108)
Creatinine: 0.75 mg/dL (ref 0.51–0.95)
GFR,Black: 104 *
GFR,Caucasian: 90 *
Glucose,Plasma: 168 mg/dL — ABNORMAL HIGH (ref 60–99)
Potassium,Plasma: 5.2 mmol/L — ABNORMAL HIGH (ref 3.4–4.7)
Sodium,Plasma: 140 mmol/L (ref 133–145)
Total Protein, PL: 7.2 g/dL (ref 6.3–7.7)
UN,Plasma: 14 mg/dL (ref 6–20)

## 2015-06-14 LAB — CBC AND DIFFERENTIAL
Baso # K/uL: 0.1 10*3/uL (ref 0.0–0.1)
Basophil %: 1 %
Eos # K/uL: 0.3 10*3/uL (ref 0.0–0.4)
Eosinophil %: 6 %
Hematocrit: 31 % — ABNORMAL LOW (ref 34–45)
Hemoglobin: 10.1 g/dL — ABNORMAL LOW (ref 11.2–15.7)
IMM Granulocytes #: 0.1 10*3/uL (ref 0.0–0.1)
IMM Granulocytes: 2.1 %
Lymph # K/uL: 0.6 10*3/uL — ABNORMAL LOW (ref 1.2–3.7)
Lymphocyte %: 12.1 %
MCH: 29 pg/cell (ref 26–32)
MCHC: 33 g/dL (ref 32–36)
MCV: 88 fL (ref 79–95)
Mono # K/uL: 0.6 10*3/uL (ref 0.2–0.9)
Monocyte %: 13.3 %
Neut # K/uL: 3.1 10*3/uL (ref 1.6–6.1)
Nucl RBC # K/uL: 0 10*3/uL (ref 0.0–0.0)
Nucl RBC %: 0 /100 WBC (ref 0.0–0.2)
Platelets: 143 10*3/uL — ABNORMAL LOW (ref 160–370)
RBC: 3.5 MIL/uL — ABNORMAL LOW (ref 3.9–5.2)
RDW: 21.9 % — ABNORMAL HIGH (ref 11.7–14.4)
Seg Neut %: 65.5 %
WBC: 4.8 10*3/uL (ref 4.0–10.0)

## 2015-06-14 LAB — TACROLIMUS LEVEL: Tacrolimus: 9.7 ng/mL

## 2015-06-14 LAB — NEUTROPHIL #-INSTRUMENT: Neutrophil #-Instrument: 3.1 10*3/uL

## 2015-06-14 LAB — MAGNESIUM, PLASMA: Magnesium, PL: 1.5 mEq/L (ref 1.3–2.1)

## 2015-06-14 LAB — LD, PL: LD, PL: 210 U/L (ref 118–225)

## 2015-06-14 NOTE — Progress Notes (Signed)
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 May have OSA.     Carly Higgins comes to BMT clinic today for follow up after recent discharge from BMT unit 06/07/15    PMH, Oncology History & Problem List  Reviewed and updated as needed    Interval History/Review of Systems  BMT admission 5/3, discharged 1/61  Course complicated with expected side effects as well as tachycardia, pulm edema requiring diuresis and Cardiology consult, currently on cardiazem and metoprolol   Hypoxia requiring oxygen use, now just at night, improved after diuresis too. Eventually planning to do sleep study as OP   Since home feels has been doing well   Having some SOB mostly with activity  Has not had to use inhaler much  Using oxygen only at night with sleep  Leg edema slowing improving  Moving around more  Came up in wheelchair as to able to walk very far yet   Denies N/V or diarrhea  No constipation   No dysuria   Fatigue  Mild sore throat   Confused where to go this am for lab work   Surveyor, minerals to phlebotomy vs BMT pod  Also doing IV magnesium at home - disconnected line but no caps at home so need left open overnight - will change needle today and give caps and call home care      Other pertinent findings checked/documented below     A comprehensive review of 13 systems was performed; pertinent positives checked below:  _0  fevers  _1  fast pulse _2  decreased appetite  _3  weakness   _4  chills _5  chest pain _6  altered taste  _7  poor mobility    _8  dry eyes _9  nausea _10  insufficient oral intake  _11  impaired ADLs improving   _12  visual changes  _13  emesis  _14  skin changes _15  insomnia    _16  headache  _17  abdominal cramping   _18  rash  _19  leg edema   _20  sores in mouth _21  abdominal discomfort _22  urinary frequency _23     _24  sore throat mild dry  _25  diarrhea, watery _26  dysuria and hematuria  _27  anxiety    _28  cough   _29  loose stool, non-formed _30  bleeding  _31  poor coping    _32  sputum production  _33  constipation   _34  pain _35  neuropathy   _36  shortness of breath   _37   weight loss _38  fatigue  _39  none of the above        Medications  Reviewed medications with patient today & electronically reconciled  Current Outpatient Prescriptions   Medication Sig    MAGNESIUM PO Take 1 tablet by mouth daily    generic DME Use 2 lpm of oxygen via nasal cannula continuously    acyclovir (ZOVIRAX) 400 MG tablet Take 1 tablet (400 mg total) by mouth 2 times daily    diltiazem (CARDIZEM SR) 120 MG 12 hr capsule Take 2 capsules (240 mg total) by mouth 2 times daily    fluconazole (DIFLUCAN) 200 MG tablet Take 1 tablet (200 mg total) by mouth nightly    metoprolol (LOPRESSOR) 50 MG tablet Take 1 tablet (50 mg total) by mouth  2 times daily    nicotine (NICODERM CQ) 14 MG/24HR patch Place 1 patch onto the skin daily   Remove & discard patch after 24 hours.    pantoprazole (PROTONIX) 40 MG EC tablet Take 1 tablet (40 mg total) by mouth every morning   Swallow whole. Do not crush, break, or chew.    generic DME Magnesium sulfate 4 gm mixed in 526m NS infuse via central over 2-4 hours daily.    tacrolimus (PROGRAF) 1 MG capsule Take 3 capsules (3 mg total) by mouth 2 times daily   And as directed by MD/NP (Patient taking differently: Take 2 mg by mouth 2 times daily   And as directed by MD/NP)    tacrolimus (PROGRAF) 0.5 MG capsule Take 1 capsule (0.5 mg total) by mouth 2 times daily   And as directed by MD/NP    heparin lock flush 10 UNIT/ML injection 5 mLs (50 Units total) by Intracatheter route daily as needed for Line Care    sodium chloride 0.9 % flush 10 mLs by Intracatheter route as needed (10 mls by intrachatheter route as needed)    escitalopram  (LEXAPRO) 10 MG tablet Take 15 mg by mouth daily    albuterol-ipratropium (COMBIVENT RESPIMAT) 20-100 MCG/ACT inhaler Inhale 1 puff into the lungs 4 times daily    nicotine polacrilex (COMMIT) 2 MG lozenge No more than 5 lozenges in 6 hours or 20 lozenges per 24 hours    ondansetron (ZOFRAN) 4 MG tablet Take 1 tablet (4 mg total) by mouth 3 times daily as needed (nausea)    prochlorperazine (COMPAZINE) 10 MG tablet Take 1 tablet (10 mg total) by mouth 4 times daily as needed for Nausea     No current facility-administered medications for this visit.          Recent labs - reviewed  Recent Results (from the past 24 hour(s))   CBC and differential    Collection Time: 06/14/15  8:18 AM   Result Value Ref Range    WBC 4.8 4.0 - 10.0 THOU/uL    RBC 3.5 (L) 3.9 - 5.2 MIL/uL    Hemoglobin 10.1 (L) 11.2 - 15.7 g/dL    Hematocrit 31 (L) 34 - 45 %    MCV 88 79 - 95 fL    MCH 29 26 - 32 pg/cell    MCHC 33 32 - 36 g/dL    RDW 21.9 (H) 11.7 - 14.4 %    Platelets 143 (L) 160 - 370 THOU/uL    Seg Neut % 65.5 %    Lymphocyte % 12.1 %    Monocyte % 13.3 %    Eosinophil % 6.0 %    Basophil % 1.0 %    Neut # K/uL 3.1 1.6 - 6.1 THOU/uL    Lymph # K/uL 0.6 (L) 1.2 - 3.7 THOU/uL    Mono # K/uL 0.6 0.2 - 0.9 THOU/uL    Eos # K/uL 0.3 0.0 - 0.4 THOU/uL    Baso # K/uL 0.1 0.0 - 0.1 THOU/uL    Nucl RBC % 0.0 0.0 - 0.2 /100 WBC    Nucl RBC # K/uL 0.0 0.0 - 0.0 THOU/uL    IMM Granulocytes # 0.1 0.0 - 0.1 THOU/uL    IMM Granulocytes 2.1 %   Tacrolimus level    Collection Time: 06/14/15  8:18 AM   Result Value Ref Range    Tacrolimus 9.7 ng/mL   Neutrophil #-Instrument    Collection Time: 06/14/15  8:18 AM   Result Value Ref Range    Neutrophil #-Instrument 3.1 THOU/uL   Comprehensive Metabolic Panel, PL    Collection Time: 06/14/15  8:26 AM   Result Value Ref Range    Potassium,Plasma 5.2 (H) 3.4 - 4.7 mmol/L    Sodium,Plasma 140 133 - 145 mmol/L    Anion Gap,PL 18 (H) 7 - 16    UN,Plasma 14 6 - 20 mg/dL    Creatinine 0.75 0.51 - 0.95 mg/dL     GFR,Caucasian 90 *    GFR,Black 104 *    Glucose,Plasma 168 (H) 60 - 99 mg/dL    Calcium, PL 10.5 (H) 8.6 - 10.2 mg/dL    Chloride,Plasma 100 96 - 108 mmol/L    CO2,Plasma 22 20 - 28 mmol/L    Alk Phos, PL 62 35 - 105 U/L    AST, PL 36 (H) 0 - 35 U/L    ALT, PL 31 0 - 35 U/L    Albumin, PL 4.2 3.5 - 5.2 g/dL    Bilirubin Total, PL 1.2 0.0 - 1.2 mg/dL    Total Protein, PL 7.2 6.3 - 7.7 g/dL   LD, PL    Collection Time: 06/14/15  8:26 AM   Result Value Ref Range    LD, PL 210 118 - 225 U/L   Magnesium, Plasma    Collection Time: 06/14/15  8:26 AM   Result Value Ref Range    Magnesium, PL 1.5 1.3 - 2.1 mEq/L       Recent Results (from the past 72 hour(s))   CBC and differential    Collection Time: 06/14/15  8:18 AM   Result Value Ref Range    WBC 4.8 4.0 - 10.0 THOU/uL    RBC 3.5 (L) 3.9 - 5.2 MIL/uL    Hemoglobin 10.1 (L) 11.2 - 15.7 g/dL    Hematocrit 31 (L) 34 - 45 %    MCV 88 79 - 95 fL    MCH 29 26 - 32 pg/cell    MCHC 33 32 - 36 g/dL    RDW 21.9 (H) 11.7 - 14.4 %    Platelets 143 (L) 160 - 370 THOU/uL    Seg Neut % 65.5 %    Lymphocyte % 12.1 %    Monocyte % 13.3 %    Eosinophil % 6.0 %    Basophil % 1.0 %    Neut # K/uL 3.1 1.6 - 6.1 THOU/uL    Lymph # K/uL 0.6 (L) 1.2 - 3.7 THOU/uL    Mono # K/uL 0.6 0.2 - 0.9 THOU/uL    Eos # K/uL 0.3 0.0 - 0.4 THOU/uL    Baso # K/uL 0.1 0.0 - 0.1 THOU/uL    Nucl RBC % 0.0 0.0 - 0.2 /100 WBC    Nucl RBC # K/uL 0.0 0.0 - 0.0 THOU/uL    IMM Granulocytes # 0.1 0.0 - 0.1 THOU/uL    IMM Granulocytes 2.1 %   Tacrolimus level    Collection Time: 06/14/15  8:18 AM   Result Value Ref Range    Tacrolimus 9.7 ng/mL   Neutrophil #-Instrument    Collection Time: 06/14/15  8:18 AM   Result Value Ref Range    Neutrophil #-Instrument 3.1 THOU/uL   Comprehensive Metabolic Panel, PL    Collection Time: 06/14/15  8:26 AM   Result Value Ref Range    Potassium,Plasma 5.2 (H) 3.4 - 4.7 mmol/L    Sodium,Plasma 140 133 - 145  mmol/L    Anion Gap,PL 18 (H) 7 - 16    UN,Plasma 14 6 - 20 mg/dL     Creatinine 0.75 0.51 - 0.95 mg/dL    GFR,Caucasian 90 *    GFR,Black 104 *    Glucose,Plasma 168 (H) 60 - 99 mg/dL    Calcium, PL 10.5 (H) 8.6 - 10.2 mg/dL    Chloride,Plasma 100 96 - 108 mmol/L    CO2,Plasma 22 20 - 28 mmol/L    Alk Phos, PL 62 35 - 105 U/L    AST, PL 36 (H) 0 - 35 U/L    ALT, PL 31 0 - 35 U/L    Albumin, PL 4.2 3.5 - 5.2 g/dL    Bilirubin Total, PL 1.2 0.0 - 1.2 mg/dL    Total Protein, PL 7.2 6.3 - 7.7 g/dL   LD, PL    Collection Time: 06/14/15  8:26 AM   Result Value Ref Range    LD, PL 210 118 - 225 U/L   Magnesium, Plasma    Collection Time: 06/14/15  8:26 AM   Result Value Ref Range    Magnesium, PL 1.5 1.3 - 2.1 mEq/L         Wt Readings from Last 3 Encounters:   06/14/15 110.5 kg (243 lb 8 oz)   06/09/15 119.7 kg (263 lb 14.3 oz)   06/06/15 117.9 kg (260 lb)     Radiology impressions (last 3 days):  No results found.        Physical Exam   There were no vitals taken for this visit.   VSS take in infusion   ONCBCN VITALS 06/14/2015   Height    Height Comments    Height Method    Weight 110.451 kg   Weight Comments    Weight Method    Weight Method Comments    BSA (m2)    Pain Score (Outpatient) 0   Pain Score (Outpatient) Comment    Pain Descriptors    Reason vitals weren't taken:    Temp 36.3 C   Temp Source Temporal   Pulse 99   Pulse Comments    Heart Rate Source    Resp 18   SpO2 99   BP 122/77   BP Method    BP Location    Patient Position      KPS  70%  Pain 0/10   Walked up her for BM bx   No acute distress, non-toxic appearing, cooperative with exam, mildly tired appearing   Appropriate affect and dress with normal speech and motor activity  scalp atraumatic  PERRLA- EOMI. Conjunctiva & corneas clear, no eye drainage. Sclera anicteric OU  Lips, oral mucosa and tongue moist, without lesions; no postpharyngeal exudate Dentition good repair   Neck is supple, no rigidity   Lymph nodes: No cervical, submandibular, supraclavicular, infraclavicular, axillary, or inguinal  Normal S1 & S2,  regular, no murmur, rub, or gallop  Respirations unlabored, lung sounds clear to A&P bilateral auscultation  Bowel sounds are normoactive in four quadrants; abdomen is soft, non-tender, and without masses or organomegaly  Radial pulses +2 and symmetric, CMS is intact  +2 to +3 bilat lower extremity edema  Neuro AOx3, speech/language WNL, moving all extremities, strength/sensation grossly intact. Normal broad based gait. Non-focal exam  Skin warm dry intact no lesions, No rash  Central linesRight mediport NT site clean dry . End of extension tubing without cap    Assessment   Pleasant  55 yo female with MPN post allogeneic MUD (F) PBSCT, here for follow up. No clinical signs of infection or GVHD. Breathing and HR stable, has cardiology follow up in 2 weeks. Recovering as expected early in BMT course.     Plan  MPN post HSCT  Regimen: Fludarabine 40 mg/m2/d and Busulfan 130 mg/m2/d x 4 days   Transplant type: Allogeneic MUD PBSC 10/10 55 yr old female ABO : A+/O\A+. CMV titer:N/N  Transplant day 0:  05/17/15  Transplant day:  + 28  Day 30 marrow scheduled 6/6, today     Hematology:   Blood Counts stable mild anemia and thrombocytopenia   No signs of bleeding.  No transfusion  Central line Mediport site clean and dry - extesion tubing without auto clave on end, open   change needle today in infusion    GvHD:   Donor MUD (female)  Completed all doses of MTX   Tacrolimus 2.5 mg BID   Goal levels 8-12 as tolerated.  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   Renal/FEN:   Creat WNL 0.59  Daily IV magnesium 4g/daily- wean to 2 g/day as able   Magnesium level 1.4  Start oral magnesium 1 tab with a meal daily titrate up as tolerated   No dysuria or hematuria  Other lytes stable  Weight up a little from discharge  May need additional diuretics- follow     Gastrointestinal:   No N/V   No diarrhea.   Cont protonix   Liver function tests stable WNL  Eating well and drinking fair  Mucositis resolving   Mild sore throat   May be oxygen at night - tylenol PRN   No oral ulcers no redness     Endocrine:   Stable.     Neurologic:   Nonfocal; no deficits.   No H/A or visual changes.   Monitor for neuro changes on Tacro   No pain.   Adjust analgesics as needed    Musculoskeletal:   Encourage activity as tolerated   debility noted and expect improvement over time   Up in wheelchair  Walking short distances   Fatigue expected to improve over time     Derm/eyes:  No eye problems  Alopecia   Skin intact  No rashes    Anticipate additional skin changes from  radiation and chemotherapy will monitor skin for changes and provide appropriate management    Psychosocial:   Family supportive.   Continue our support.   Marland Kitchen   BMT attending Lytle Butte   BMT coordinator Meyler   PCP Cahnhidalgo  Adjust plan of care as needed.     Follow up  BMT clinic twice weekly for now and PRN  Labs twice weekly (Usually M/Th)      Irene Pap, NP  06/14/15  5:38 PM  Clinical Staging & Grading of Acute Graft-versus-host Disease in  Adults1    Organ involvement (maximum stage in prior 3 days)  Skin Stage2 Liver Stage Gut Stage   extent of rash   using Rule of [place] TB  in mg/dlb volume of diarrhea  in ml/day   _0  None _1  None _2  None   _3  1  <25% _4  1  2-3 _5  1   >500 or persistent nauseac   _6  2  25-50% _7  2  >3-6 _8  2  >1000   _9  3  >50% _10  3 >6-15 _11  3  >1500   _12  4  generalized erythroderma (diffuse erythema and scaling) with bullous formation _13  4  >15 _14  4   severe abdominal pain with or without ileus     aRule of nine      Overall grade  Graded Skin Liver GI   _15  None  None None None   _16  I Stage 1-2 None None   _17  II Stage 3 or Stage 1 or Stage 1   _18  III --- Stage 2-3 or Stage 2-4   _19  IVe Stage 4 Stage 4 ---     discuss increasing oral magnesium to BID, while continuing IV magnesium for now   We are also going to start oral Bactrim M/W/F this Friday   Script sent to [facility] pharmacy - she will pick up then   Regarding ideas for tongue feeling like it is burning when eating  Dr Lytle Butte was not sure either  Both agreed to try mycelex troches prescribed by dentist as no harm     We also suggested dilute peroxide 1/4 part  Peroxide and 3/4 part water   Do not swallow- gargale and discard    If no improvement will sent to Dr Girard Cooter to evaluate for burning mouth syndrome or other etiology   May also try baking soda as well.    Hoping this may be a weird sensation post mucositis from conditioning regimen that will get better over time     Tacro level to stay at current dose

## 2015-06-14 NOTE — Procedures (Signed)
Bone Marrow Aspirate and Biopsy Procedure Note    Carly Higgins is a 55 y.o. female who comes to the [facility] today for a scheduled bone marrow aspirate and biopsy procedure to evaluate MPN/MPD and graft status after undergoing an Allogeneic MUD HSCT     Both informed and written consent was obtained after discussing potential risks of the procedure including but not limited to bleeding, infection and pain. All questions were answered satisfactorily and patient verbalizes a good understanding of bone marrow aspirate and biopsy procedure and is willing to have procedure done here today.   Per policy a time out was taken to verify the patient's identity with the spelling of Carly Higgins last name and date of birth.    What Procedure Was Performed: bone marrow aspirate and biopsy.   Correct Procedure: Yes   Correct Patient: (use 2 Identifiers) Yes   Correct Site: Yes right posterior crest   Site marked: Yes   Correct Side: Yes   Correct Patient Position: Yes prone   Consent Verified: Yes   Appropriate Hand Hygiene Used: Yes   Appropriate barrier: fenestrated drape in place entire time  List of Participants Involved in Time-Out process on 06/14/15 9am:  Irene Pap FNP, and Tiburcio Bash  (BM tech)     Carly Higgins was placed in the prone position and the right posterior iliac crest was prepped with Betadine and sterilely draped. Approximately 20cc 1% lidocaine was used to locally anesthetize the skin and soft tissues directly to the periosteum.    An 8 gauge T-lok bone marrow biopsy needle was introduced into the iliac marrow space and 1 cc of aspirate was obtained and then hand to hematopathology technologist who determined that the aspirate had spicule's present, slides were made. An additional  12 ml of particulate aspirate was obtained after one pass/attempt with out difficulty and sent for flow cytometry, cytogenetics, FISH and chimerism. The bone marrow needle was advanced and a 1 cm core bone marrow  biopsy was obtained for surgical pathology, with the first pass     The needle was removed and direct pressure was applied to the site for greater than 5 minutes. The area was then cleaned and a sterile dry pressure dressing was placed over the site with folded 2x2's and elastoplasty tape. Pt had minimal bleeding and discomfort. No other immediate complications.   The patient was advised to leave the dressing on until the following morning and was instructed to refer to the provided, written post-procedure information and/or to call Dr Lytle Butte or myself with any increased redness, swelling, drainage including bleeding, persistent or worsening pain at the biopsy site, or fever.     The procedure was tolerated well and there were no complications.   Procedure started at approx   9am completed by    9:40am.   Pt stayed approx 20-30 min after procedure to make sure no problems.   Left with family.     Pt will follow-up with [name] to discuss these results once they become available     Nurse Practitioner: Irene Pap

## 2015-06-14 NOTE — Telephone Encounter (Signed)
I explained to Carly Higgins that Carly Higgins plans to call her later today to discuss her oral pain. I believe Carly Higgins has discussed with [name].  Carly Higgins stated satisfaction with this plan.   I reviewed today's labs with her and let her know her Tacrolimus level is exactly where we want it to be so no dose change.  I introduced myself as her coordinator going forward.

## 2015-06-14 NOTE — Progress Notes (Signed)
Pt outer port of IVAD previously accessed. Labs drawn and sent per order. Good blood return noted, flushing easily.  Pt remains accessed at this time . No further issues discharged to clinic.

## 2015-06-14 NOTE — Telephone Encounter (Signed)
Called Kristabella to review labs and discuss increasing oral magnesium to BID, while continuing IV magnesium for now   We are also going to start oral Bactrim M/W/F this Friday   Script sent to [facility] pharmacy - she will pick up then   Regarding ideas for tongue feeling like it is burning when eating  Dr Lytle Butte was not sure either  Both agreed to try mycelex troches prescribed by dentist as no harm     We also suggested dilute peroxide 1/4 part  Peroxide and 3/4 part water   Do not swallow- gargale and discard    If no improvement will sent to Dr Girard Cooter to evaluate for burning mouth syndrome or other etiology   May also try baking soda as well.    Hoping this may be a weird sensation post mucositis from conditioning regimen that will get better over time     Tacro level to stay at current dose   Appreciative   Irene Pap, NP

## 2015-06-15 DIAGNOSIS — T451X5A Adverse effect of antineoplastic and immunosuppressive drugs, initial encounter: Secondary | ICD-10-CM | POA: Insufficient documentation

## 2015-06-15 DIAGNOSIS — R239 Unspecified skin changes: Secondary | ICD-10-CM | POA: Insufficient documentation

## 2015-06-15 DIAGNOSIS — E875 Hyperkalemia: Secondary | ICD-10-CM | POA: Insufficient documentation

## 2015-06-15 MED ORDER — SULFAMETHOXAZOLE-TRIMETHOPRIM 800-160 MG PO TABS *I*
1.0000 | ORAL_TABLET | ORAL | 6 refills | Status: DC
Start: 2015-06-17 — End: 2015-08-10

## 2015-06-15 NOTE — Patient Instructions (Addendum)
July 2017   Sunday Monday Tuesday Wednesday Thursday Friday Saturday                                 1       2     3     4     5     INJECTION    8:30 AM     FOLLOW UP     9:00 AM 6     7     8       9     10     11     12     13     INJECTION   11:15 AM     FOLLOW UP    11:30 AM 14     15       16     17     18     INJECTION   10:00 AM      FOLLOW UP    10:30 AM 19     20     21     22       23     24     25     26     27     28     INJECTION   11:00 AM     FOLLOW UP    11:30 AM 29       30     08 September 2015   Sunday Monday Tuesday Wednesday Thursday Friday Saturday             1     2     3     INJECTION   10:30 AM     FOLLOW UP    11:00 AM 4     5       6     7     8     9     10     11     INJECTION   10:00 AM     FOLLOW UP    10:30 AM 12       13     14     15     16     17     18     INJECTION   10:00 AM     FOLLOW UP    10:30 AM 19       20     21     22     23     INJECTION    9:30 AM     FOLLOW UP    10:00 AM 24     25     26       27     28     29     30     31      INJECTION   10:30 AM     FOLLOW UP    11:00 AM

## 2015-06-15 NOTE — Progress Notes (Addendum)
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 May have OSA.  BMT admission 5/3, discharged 5/30  BMT Course complicated with expected side effects as well as tachycardia, pulm edema requiring diuresis and Cardiology consult, currently on cardiazem and metoprolol   Hypoxia requiring oxygen use, now just at night, improved after diuresis too. Eventually planning to do sleep study as OP     Carly Higgins comes to BMT clinic today for day 30 BM bx to evaluate engraftment and MPD, and to evaluate for infection, GVHD, evaluate skin changes and burning mouth sensation.    PMH, Oncology History & Problem List  Reviewed and updated as needed    Interval History/Review of Systems  Since last last week, Carly Higgins feels stronger   She still has discomfort on her tongue when eating, a burning like sensation  It keeps her from wanting to eat  She has a mild coating on her tongue   Her dentist prescribed mycelex troches, shared it wont hurt to take and see if any improvement  She is on diflucan   Edema in legs and feet better  Mobility better   Walking more  Some DOE,   Has not had to use inhaler much  Using oxygen only at night with sleep  Denies N/V or diarrhea  No constipation   No dysuria   Fatigue  doing IV magnesium at home, not this am yet    Her skin is more hyperpigmented no itching or tenderness  Advised additional chanes may occur under arms, breasts and groin.     Other pertinent findings checked/documented below     A comprehensive review of 13 systems was performed; pertinent positives checked below:  _0  fevers  _1  fast pulse _2  decreased appetite  _3  weakness   _4  chills _5  chest pain _6  altered  taste  _7  poor mobility    _8  dry eyes _9  nausea _10  insufficient oral intake  _11  impaired ADLs improving   _12  visual changes  _13  emesis  _14  skin changes _15  insomnia    _16  headache  _17  abdominal cramping  _18  rash  _19  leg edema better   _20  sores in mouth _21  abdominal discomfort _22  urinary frequency _23     _24  sore tongue  _25  diarrhea, watery _26  dysuria and hematuria  _27  anxiety    _28  cough   _29  loose stool, non-formed _30  bleeding  _31  poor coping    _32  sputum production  _33  constipation   _34  pain _35  neuropathy   _36  shortness of breath/DOE   _37   weight loss likely some fluid loss  _38  fatigue  _39  none of the above        Medications  Reviewed medications with patient today & electronically reconciled  Current Outpatient Prescriptions   Medication Sig Note    MAGNESIUM PO Take 1 tablet by mouth daily     generic DME Use 2 lpm of oxygen via nasal cannula continuously     acyclovir (ZOVIRAX) 400 MG tablet Take 1 tablet (400 mg total) by mouth 2 times daily     diltiazem (CARDIZEM SR) 120 MG 12 hr capsule Take 2 capsules (  240 mg total) by mouth 2 times daily     fluconazole (DIFLUCAN) 200 MG tablet Take 1 tablet (200 mg total) by mouth nightly     metoprolol (LOPRESSOR) 50 MG tablet Take 1 tablet (50 mg total) by mouth 2 times daily     nicotine (NICODERM CQ) 14 MG/24HR patch Place 1 patch onto the skin daily   Remove & discard patch after 24 hours.     pantoprazole (PROTONIX) 40 MG EC tablet Take 1 tablet (40 mg total) by mouth every morning   Swallow whole. Do not crush, break, or chew.     generic DME Magnesium sulfate 4 gm mixed in 564m NS infuse via central over 2-4 hours daily.     tacrolimus (PROGRAF) 1 MG capsule Take 3 capsules (3 mg total) by mouth 2 times daily   And as directed by MD/NP (Patient taking differently: Take 2 mg by mouth 2 times daily   Current dose 2.5 mg a day as of 06/14/15 (no change since d/c))     tacrolimus (PROGRAF) 0.5 MG capsule Take 1 capsule (0.5 mg total) by mouth 2 times  daily   And as directed by MD/NP     heparin lock flush 10 UNIT/ML injection 5 mLs (50 Units total) by Intracatheter route daily as needed for Line Care     sodium chloride 0.9 % flush 10 mLs by Intracatheter route as needed (10 mls by intrachatheter route as needed)     escitalopram (LEXAPRO) 10 MG tablet Take 15 mg by mouth daily     [START ON 06/17/2015] sulfamethoxazole-trimethoprim (BACTRIM DS,SEPTRA DS) 800-160 MG per tablet Take 1 tablet by mouth three times a week 06/15/2015: To start Fri 6/9    albuterol-ipratropium (COMBIVENT RESPIMAT) 20-100 MCG/ACT inhaler Inhale 1 puff into the lungs 4 times daily     nicotine polacrilex (COMMIT) 2 MG lozenge No more than 5 lozenges in 6 hours or 20 lozenges per 24 hours     ondansetron (ZOFRAN) 4 MG tablet Take 1 tablet (4 mg total) by mouth 3 times daily as needed (nausea)     prochlorperazine (COMPAZINE) 10 MG tablet Take 1 tablet (10 mg total) by mouth 4 times daily as needed for Nausea      No current facility-administered medications for this visit.          Recent labs - reviewed  Recent Results (from the past 24 hour(s))   CBC and differential    Collection Time: 06/14/15  8:18 AM   Result Value Ref Range    WBC 4.8 4.0 - 10.0 THOU/uL    RBC 3.5 (L) 3.9 - 5.2 MIL/uL    Hemoglobin 10.1 (L) 11.2 - 15.7 g/dL    Hematocrit 31 (L) 34 - 45 %    MCV 88 79 - 95 fL    MCH 29 26 - 32 pg/cell    MCHC 33 32 - 36 g/dL    RDW 21.9 (H) 11.7 - 14.4 %    Platelets 143 (L) 160 - 370 THOU/uL    Seg Neut % 65.5 %    Lymphocyte % 12.1 %    Monocyte % 13.3 %    Eosinophil % 6.0 %    Basophil % 1.0 %    Neut # K/uL 3.1 1.6 - 6.1 THOU/uL    Lymph # K/uL 0.6 (L) 1.2 - 3.7 THOU/uL    Mono # K/uL 0.6 0.2 - 0.9 THOU/uL    Eos # K/uL 0.3  0.0 - 0.4 THOU/uL    Baso # K/uL 0.1 0.0 - 0.1 THOU/uL    Nucl RBC % 0.0 0.0 - 0.2 /100 WBC    Nucl RBC # K/uL 0.0 0.0 - 0.0 THOU/uL    IMM Granulocytes # 0.1 0.0 - 0.1 THOU/uL    IMM Granulocytes 2.1 %   Tacrolimus level    Collection Time: 06/14/15   8:18 AM   Result Value Ref Range    Tacrolimus 9.7 ng/mL   Neutrophil #-Instrument    Collection Time: 06/14/15  8:18 AM   Result Value Ref Range    Neutrophil #-Instrument 3.1 THOU/uL   Comprehensive Metabolic Panel, PL    Collection Time: 06/14/15  8:26 AM   Result Value Ref Range    Potassium,Plasma 5.2 (H) 3.4 - 4.7 mmol/L    Sodium,Plasma 140 133 - 145 mmol/L    Anion Gap,PL 18 (H) 7 - 16    UN,Plasma 14 6 - 20 mg/dL    Creatinine 0.75 0.51 - 0.95 mg/dL    GFR,Caucasian 90 *    GFR,Black 104 *    Glucose,Plasma 168 (H) 60 - 99 mg/dL    Calcium, PL 10.5 (H) 8.6 - 10.2 mg/dL    Chloride,Plasma 100 96 - 108 mmol/L    CO2,Plasma 22 20 - 28 mmol/L    Alk Phos, PL 62 35 - 105 U/L    AST, PL 36 (H) 0 - 35 U/L    ALT, PL 31 0 - 35 U/L    Albumin, PL 4.2 3.5 - 5.2 g/dL    Bilirubin Total, PL 1.2 0.0 - 1.2 mg/dL    Total Protein, PL 7.2 6.3 - 7.7 g/dL   LD, PL    Collection Time: 06/14/15  8:26 AM   Result Value Ref Range    LD, PL 210 118 - 225 U/L   Magnesium, Plasma    Collection Time: 06/14/15  8:26 AM   Result Value Ref Range    Magnesium, PL 1.5 1.3 - 2.1 mEq/L       Recent Results (from the past 72 hour(s))   CBC and differential    Collection Time: 06/14/15  8:18 AM   Result Value Ref Range    WBC 4.8 4.0 - 10.0 THOU/uL    RBC 3.5 (L) 3.9 - 5.2 MIL/uL    Hemoglobin 10.1 (L) 11.2 - 15.7 g/dL    Hematocrit 31 (L) 34 - 45 %    MCV 88 79 - 95 fL    MCH 29 26 - 32 pg/cell    MCHC 33 32 - 36 g/dL    RDW 21.9 (H) 11.7 - 14.4 %    Platelets 143 (L) 160 - 370 THOU/uL    Seg Neut % 65.5 %    Lymphocyte % 12.1 %    Monocyte % 13.3 %    Eosinophil % 6.0 %    Basophil % 1.0 %    Neut # K/uL 3.1 1.6 - 6.1 THOU/uL    Lymph # K/uL 0.6 (L) 1.2 - 3.7 THOU/uL    Mono # K/uL 0.6 0.2 - 0.9 THOU/uL    Eos # K/uL 0.3 0.0 - 0.4 THOU/uL    Baso # K/uL 0.1 0.0 - 0.1 THOU/uL    Nucl RBC % 0.0 0.0 - 0.2 /100 WBC    Nucl RBC # K/uL 0.0 0.0 - 0.0 THOU/uL    IMM Granulocytes # 0.1 0.0 - 0.1 THOU/uL    IMM  Granulocytes 2.1 %   Tacrolimus level      Collection Time: 06/14/15  8:18 AM   Result Value Ref Range    Tacrolimus 9.7 ng/mL   Neutrophil #-Instrument    Collection Time: 06/14/15  8:18 AM   Result Value Ref Range    Neutrophil #-Instrument 3.1 THOU/uL   Comprehensive Metabolic Panel, PL    Collection Time: 06/14/15  8:26 AM   Result Value Ref Range    Potassium,Plasma 5.2 (H) 3.4 - 4.7 mmol/L    Sodium,Plasma 140 133 - 145 mmol/L    Anion Gap,PL 18 (H) 7 - 16    UN,Plasma 14 6 - 20 mg/dL    Creatinine 0.75 0.51 - 0.95 mg/dL    GFR,Caucasian 90 *    GFR,Black 104 *    Glucose,Plasma 168 (H) 60 - 99 mg/dL    Calcium, PL 10.5 (H) 8.6 - 10.2 mg/dL    Chloride,Plasma 100 96 - 108 mmol/L    CO2,Plasma 22 20 - 28 mmol/L    Alk Phos, PL 62 35 - 105 U/L    AST, PL 36 (H) 0 - 35 U/L    ALT, PL 31 0 - 35 U/L    Albumin, PL 4.2 3.5 - 5.2 g/dL    Bilirubin Total, PL 1.2 0.0 - 1.2 mg/dL    Total Protein, PL 7.2 6.3 - 7.7 g/dL   LD, PL    Collection Time: 06/14/15  8:26 AM   Result Value Ref Range    LD, PL 210 118 - 225 U/L   Magnesium, Plasma    Collection Time: 06/14/15  8:26 AM   Result Value Ref Range    Magnesium, PL 1.5 1.3 - 2.1 mEq/L         Wt Readings from Last 3 Encounters:   06/14/15 110.5 kg (243 lb 8 oz)   06/09/15 119.7 kg (263 lb 14.3 oz)   06/06/15 117.9 kg (260 lb)     Radiology impressions (last 3 days):  No results found.        Physical Exam   There were no vitals taken for this visit.   VSS take in infusion   ONCBCN VITALS 06/14/2015   Height    Height Comments    Height Method    Weight 110.451 kg   Weight Comments    Weight Method    Weight Method Comments    BSA (m2)    Pain Score (Outpatient) 0   Pain Score (Outpatient) Comment    Pain Descriptors    Reason vitals weren't taken:    Temp 36.3 C   Temp Source Temporal   Pulse 99   Pulse Comments    Heart Rate Source    Resp 18   SpO2 99   BP 122/77   BP Method    BP Location    Patient Position      KPS  70%  Pain 0/10   Walked up her for BM bx   No acute distress, non-toxic appearing,  cooperative with exam, mildly tired appearing   Appropriate affect and dress with normal speech and motor activity  scalp atraumatic  PERRLA- EOMI. Conjunctiva & corneas clear, no eye drainage. Sclera anicteric OU  Lips, oral mucosa and tongue moist, without lesions; no postpharyngeal exudate Dentition good repair   Neck is supple, no rigidity   Lymph nodes: No cervical, submandibular, supraclavicular, infraclavicular, axillary, or inguinal  Normal S1 & S2, regular, no murmur, rub, or  gallop  Respirations unlabored, lung sounds clear to A&P bilateral auscultation  Bowel sounds are normoactive in four quadrants; abdomen is soft, non-tender, and without masses or organomegaly  Radial pulses +2 and symmetric, CMS is intact  +2 bilat lower extremity edema  Neuro AOx3, speech/language WNL, moving all extremities, strength/sensation grossly intact. Normal broad based gait. Non-focal exam  Skin warm dry intact no lesions, No rash mild hyperpigmentation noted.   Central linesRight mediport NT site clean dry .     Assessment   Pleasant  55 yo female with MPN post allogeneic MUD (F) PBSCT, here for follow up. No clinical signs of infection or GVHD. Breathing and HR stable since discharge. Has cardiology follow up. Recovering as expected early in BMT course. Tongue burning sensation may be healinhg mucositis or other etiology like BMS, if worsens with obtain oral medicine consult    Plan  MPN post HSCT  Regimen: Fludarabine 40 mg/m2/d and Busulfan 130 mg/m2/d x 4 days   Transplant type: Allogeneic MUD PBSC 10/10 55 yr old female ABO : A+/O\A+. CMV titer:N/N  Transplant day 0:  05/17/15  Transplant day:  + 28  Day 30 marrow scheduled 6/6, today (see not tol well)    Hematology:   Blood Counts stable mild anemia and thrombocytopenia cont to improve - PLT 143K HCT 31%  No signs of bleeding.  No transfusion  Central line Mediport site clean and dry     GvHD:   Donor MUD (female)  Completed all doses of MTX   Tacrolimus 2.5 mg BID      Goal levels 8-12 as tolerated.  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   Renal/FEN:   Creat WNL   Daily IV magnesium 4g/daily- wean to 2 g/day as able -   Will ask Barnetta Chapel RN to reduce with next delivery   Magnesium level 1.5 (before doing IV)  increase oral magnesium 1 tab BID with a meal   Titrate up as tolerated to a goal of 4 tabs a day   Hyperkalemia mild, likely related to Tacro use  If  cont to increase may need to start florinef 0.1 mg daily - if BP stable will be able to try   No dysuria or hematuria  Other lytes stable  Weight down -some fluid loss and decreased PO    Gastrointestinal:   No N/V   No diarrhea.   Cont protonix   Liver function tests stable WNL  Eating well and drinking fair  Mucositis resolving   No oral ulcers no redness   Tongue coating   Tongue feeling like it is  burning when eating  mycelex troches prescribed by dentist will try as no harm   We also suggested dilute peroxide 1/4 part  Peroxide and 3/4 part water - gargale and discard  If no improvement will sent to Dr Girard Cooter to evaluate for burning mouth syndrome or other etiology   May also try baking soda as well.  Hoping this may be a weird sensation post mucositis from conditioning regimen that will get better over time   Tylenol helps with some of the discomfort - declined BMX and did not think narcotic would help  wt loss is both fluid and decreased caloric intake  Will cont to monitor closely     Endocrine:   Stable.     Neurologic:   Nonfocal; no deficits.   No H/A or visual changes.   Monitor for neuro changes on Tacro   Tongue discomfort may be BMS (burning mouth syndrome) vs resolving mucositis vs other etiology(see above) COnt topical rinses,.   Tylenol PRN pain   Deferred BMX - did not help inpt, does not feel she need a narcotic at this time  Adjust analgesics as needed    Musculoskeletal:   Encourage activity as tolerated   debility much better today - expect continued improvement over time   Wheelchair longer distances  Walking more    Fatigue expected to improve over time     Derm/eyes:  No eye problems  Alopecia   Skin intact  No rashes    Has skin changes from chemotherapy starting - hyperpigmentation mild, no itching or tenderness.   If needed can use aquaphor or radiagel- did not order today     Psychosocial:   Family supportive.   Continue our support.   Marland Kitchen   BMT attending Lytle Butte   BMT coordinator [place]   PCP [place]go  Adjust plan of care as needed.     Follow up  BMT clinic twice weekly for now and PRN  Labs twice weekly (Usually M/Th)      Irene Pap, NP  06/14/15  5:38 PM  Clinical Staging & Grading of Acute Graft-versus-host Disease in Adults1    Organ involvement (maximum stage in prior 3 days)  Skin Stage2 Liver Stage Gut Stage   extent of rash   using Rule of [place] TB  in mg/dlb volume  of diarrhea  in ml/day   _0  None _1  None _2  None   _3  1  <25% _4  1  2-3 _5  1   >500 or persistent nauseac   _6  2  25-50% _7  2  >3-6 _8  2  >1000   _9  3  >50% _10  3 >6-15 _11  3  >1500   _12  4  generalized erythroderma (diffuse erythema and scaling) with bullous formation _13  4  >15 _14  4   severe abdominal pain with or without ileus     aRule of nine      Overall grade  Graded Skin Liver GI   _15  None  None None None   _0  I Stage 1-2 None None   _1  II Stage 3 or Stage 1 or Stage 1   _2  III --- Stage 2-3 or Stage 2-4   _3  IVe Stage 4 Stage 4 ---

## 2015-06-16 LAB — CHIMERISM: Percent Recipient: 2

## 2015-06-16 LAB — CHIM REVIEW

## 2015-06-17 ENCOUNTER — Ambulatory Visit: Payer: Self-pay

## 2015-06-17 ENCOUNTER — Telehealth: Payer: Self-pay | Admitting: Hematology

## 2015-06-17 ENCOUNTER — Ambulatory Visit: Payer: Self-pay | Admitting: Oncology

## 2015-06-17 VITALS — BP 102/73 | HR 88 | Temp 97.3°F | Resp 18 | Ht 65.75 in | Wt 243.6 lb

## 2015-06-17 DIAGNOSIS — Z9481 Bone marrow transplant status: Secondary | ICD-10-CM

## 2015-06-17 DIAGNOSIS — D471 Chronic myeloproliferative disease: Secondary | ICD-10-CM

## 2015-06-17 DIAGNOSIS — E875 Hyperkalemia: Secondary | ICD-10-CM

## 2015-06-17 DIAGNOSIS — D849 Immunodeficiency, unspecified: Secondary | ICD-10-CM

## 2015-06-17 LAB — COMPREHENSIVE METABOLIC PANEL, PL
ALT, PL: 40 U/L — ABNORMAL HIGH (ref 0–35)
AST, PL: 31 U/L (ref 0–35)
Albumin, PL: 4.1 g/dL (ref 3.5–5.2)
Alk Phos, PL: 72 U/L (ref 35–105)
Anion Gap,PL: 16 (ref 7–16)
Bilirubin Total, PL: 0.9 mg/dL (ref 0.0–1.2)
CO2,Plasma: 21 mmol/L (ref 20–28)
Calcium, PL: 9.9 mg/dL (ref 8.6–10.2)
Chloride,Plasma: 100 mmol/L (ref 96–108)
Creatinine: 0.97 mg/dL — ABNORMAL HIGH (ref 0.51–0.95)
GFR,Black: 76 *
GFR,Caucasian: 66 *
Glucose,Plasma: 195 mg/dL — ABNORMAL HIGH (ref 60–99)
Potassium,Plasma: 5.5 mmol/L — ABNORMAL HIGH (ref 3.4–4.7)
Sodium,Plasma: 137 mmol/L (ref 133–145)
Total Protein, PL: 7 g/dL (ref 6.3–7.7)
UN,Plasma: 20 mg/dL (ref 6–20)

## 2015-06-17 LAB — TYPE AND SCREEN
ABO RH Blood Type: A POS
Antibody Screen: NEGATIVE

## 2015-06-17 LAB — LD, PL: LD, PL: 179 U/L (ref 118–225)

## 2015-06-17 LAB — MAGNESIUM, PLASMA: Magnesium, PL: 1.6 mEq/L (ref 1.3–2.1)

## 2015-06-17 LAB — TACROLIMUS LEVEL: Tacrolimus: 15.5 ng/mL

## 2015-06-17 LAB — CBC AND DIFFERENTIAL
Baso # K/uL: 0 10*3/uL (ref 0.0–0.1)
Basophil %: 0.6 %
Eos # K/uL: 0.3 10*3/uL (ref 0.0–0.4)
Eosinophil %: 5.8 %
Hematocrit: 28 % — ABNORMAL LOW (ref 34–45)
Hemoglobin: 9.4 g/dL — ABNORMAL LOW (ref 11.2–15.7)
IMM Granulocytes #: 0.1 10*3/uL (ref 0.0–0.1)
IMM Granulocytes: 1 %
Lymph # K/uL: 0.6 10*3/uL — ABNORMAL LOW (ref 1.2–3.7)
Lymphocyte %: 11.8 %
MCH: 29 pg/cell (ref 26–32)
MCHC: 33 g/dL (ref 32–36)
MCV: 88 fL (ref 79–95)
Mono # K/uL: 0.6 10*3/uL (ref 0.2–0.9)
Monocyte %: 12 %
Neut # K/uL: 3.4 10*3/uL (ref 1.6–6.1)
Nucl RBC # K/uL: 0 10*3/uL (ref 0.0–0.0)
Nucl RBC %: 0 /100 WBC (ref 0.0–0.2)
Platelets: 119 10*3/uL — ABNORMAL LOW (ref 160–370)
RBC: 3.2 MIL/uL — ABNORMAL LOW (ref 3.9–5.2)
RDW: 21.4 % — ABNORMAL HIGH (ref 11.7–14.4)
Seg Neut %: 68.8 %
WBC: 5 10*3/uL (ref 4.0–10.0)

## 2015-06-17 LAB — NEUTROPHIL #-INSTRUMENT: Neutrophil #-Instrument: 3.4 10*3/uL

## 2015-06-17 MED ORDER — SODIUM POLYSTYRENE SULFONATE 15 GM/60ML PO SUSP *WRAPPED*
15.0000 g | Freq: Once | ORAL | 0 refills | Status: AC
Start: 2015-06-17 — End: 2015-06-17

## 2015-06-17 NOTE — Telephone Encounter (Signed)
verifying quantity of kayexalate, give 4 bottles of 60 mls.

## 2015-06-17 NOTE — Progress Notes (Signed)
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    PMH, Oncology History & Problem List  Reviewed    Interval History/Review of Systems  Feeling well.  Complaining of some right shoulder muscle pain- encouraged tylenol, heat/ice  Complaining of occasional nausea, usually at night, taking PRN zofran or compazine with relief. Eating and drinking well.  Weight stable.   Had day 30 bone marrow biopsy this week, results still pending.   Increased magnesium supplement to 3 tablets daily  Spaced IV magnesium to every other day  Has not been smoking or using NRT  Has not been using oxygen, breathing feels much better  Starting bactrim today  Potassium 5.5 today, kayexalate x 1 prescribed  Tacro level 15.5, will hold tonight and tomorrow morning dose, resume at 1 mg BID     Other pertinent findings checked/documented below  []  fevers  []  palpitations []  urinary frequency   []  chills []  chest pain []  dysuria    []  dry eyes [x]  nausea/emesis  []  rash   []  ear pain []  abdominal cramping []  skin changes   []  sores in mouth []  abdominal discomfort []  pain   []  sore throat []  loose stool, non-formed []  fatigue   []  cough  []  diarrhea, watery []  enlarged lymph nodes   []  sputum production  []  weight loss []  bleeding   []  shortness of breath []  insufficient oral intake []  none of the above     Medications  Reviewed medications with patient today & electronically reconciled  Current Outpatient Prescriptions on File Prior to Visit   Medication Sig Dispense Refill    sulfamethoxazole-trimethoprim (BACTRIM DS,SEPTRA DS) 800-160 MG per tablet Take 1 tablet by mouth three times a week 15 tablet 6    MAGNESIUM PO Take 1 tablet by mouth daily      albuterol-ipratropium (COMBIVENT RESPIMAT) 20-100 MCG/ACT inhaler Inhale 1 puff into the lungs 4 times daily 1 Inhaler 3    generic DME Use 2 lpm of oxygen via nasal cannula continuously 1 each 0     acyclovir (ZOVIRAX) 400 MG tablet Take 1 tablet (400 mg total) by mouth 2 times daily 120 tablet 5    diltiazem (CARDIZEM SR) 120 MG 12 hr capsule Take 2 capsules (240 mg total) by mouth 2 times daily 60 capsule 1    fluconazole (DIFLUCAN) 200 MG tablet Take 1 tablet (200 mg total) by mouth nightly 120 tablet 5    metoprolol (LOPRESSOR) 50 MG tablet Take 1 tablet (50 mg total) by mouth 2 times daily 60 tablet 1    nicotine (NICODERM CQ) 14 MG/24HR patch Place 1 patch onto the skin daily   Remove & discard patch after 24 hours. 21 patch 2    nicotine polacrilex (COMMIT) 2 MG lozenge No more than 5 lozenges in 6 hours or 20 lozenges per 24 hours 108 lozenge 1    pantoprazole (PROTONIX) 40 MG EC tablet Take 1 tablet (40 mg total) by mouth every morning   Swallow whole. Do not crush, break, or chew. 60 tablet 5    ondansetron (ZOFRAN) 4 MG tablet Take 1 tablet (4 mg total) by mouth 3 times daily as needed (nausea) 90 tablet 0    generic DME Magnesium sulfate 4 gm mixed in 528m NS infuse via central over 2-4 hours daily. 14 each 6    tacrolimus (PROGRAF) 1 MG capsule Take 3 capsules (3  mg total) by mouth 2 times daily   And as directed by MD/NP (Patient taking differently: Take 2 mg by mouth 2 times daily   Current dose 2.5 mg a day as of 06/14/15 (no change since d/c)) 180 capsule 6    tacrolimus (PROGRAF) 0.5 MG capsule Take 1 capsule (0.5 mg total) by mouth 2 times daily   And as directed by MD/NP 60 capsule 6    heparin lock flush 10 UNIT/ML injection 5 mLs (50 Units total) by Intracatheter route daily as needed for Line Care 600 mL 6    sodium chloride 0.9 % flush 10 mLs by Intracatheter route as needed (10 mls by intrachatheter route as needed) 600 mL 6    prochlorperazine (COMPAZINE) 10 MG tablet Take 1 tablet (10 mg total) by mouth 4 times daily as needed for Nausea 40 tablet 6    escitalopram (LEXAPRO) 10 MG tablet Take 15 mg by mouth daily       No current facility-administered medications on file  prior to visit.        Recent labs  Reviewed, see below    VS, weight, KPS  Reviewed Vital signs, stable    Wt Readings from Last 3 Encounters:   06/17/15 110.5 kg (243 lb 9.6 oz)   06/14/15 110.5 kg (243 lb 8 oz)   06/09/15 119.7 kg (263 lb 14.3 oz)       KPS%: 70    Physical Exam   No acute distress, non-toxic appearing, cooperative with exam  Appropriate affect and dress with normal speech and motor activity  Normocephalic, scalp atraumatic  Conjunctiva & corneas clear, no purulent eye drainage  Lips, oral mucosa and tongue moist, without lesions; no postpharyngeal exudate  Neck is supple, normal skin turgor  Normal S1 & S2, regular, no murmur, rub, or gallop  Respirations unlabored, lung sounds clear to A&P bilateral auscultation  Bowel sounds are normoactive in four quadrants; abdomen is tympanic, soft, non-tender, and without masses or organomegaly  Radial pulses +2 and symmetric, CMS is intact  No lower extremity edema  Skin is without rashes or lesions, darkened due to chemotherapy    Review of EPIC data performed including Laboratory, Pathology, Radiology and clinical records was performed.     Clinical Staging & Grading of Acute Graft-versus-host Disease in Adults1  Organ involvement (maximum stage in prior 3 days)    Skin Stage2 Liver Stage Gut Stage   extent of rash using Rule of [place] TB  in mg/dlb   volume of diarrhea in ml/day   []  None   [x]  None [x]  None   []  1   BSA <25%   []  1   TB 2-3 []  1   >500 or persistent nauseac   []  2  BSA 25-50%   []  2   TB >3-6 []  2  >1000   []  3   BSA >50%   []  3   TB >6-15 []  3  >1500   []  4   Skin with generalized erythroderma   (diffuse erythema and scaling) & bullous formation []  4   TB >15 []  4   severe abdominal pain with or without ileus     aRule of nine      bDowngrade one stage if an additional cause of elevated bilirubin has been documented.  cPersistent nausea with histologic evidence of GVHD in the stomach or duodenum.     Overall grade    Graded Skin  Liver GI   [  x] None  None None None   []  I Stage 1-2 None None   []  II Stage 3 or Stage 1 or Stage 1   []  III --- Stage 2-3 or Stage 2-4   []  IVe Stage 4 Stage 4 ---   dCriteria for grading given as a minimum degree of organ involvement required to confer that grade.  eGrade IV may also include lesser organ involvement with an extreme decrease in performance status.        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 The patient came to clinic today for routine follow up    Atypical MPN  Completed Decitabine 67m/m2 IV daily x 5 days - Cycle # 1 12/15/14  On intermittent/dose adjusted hydroxyurea prior to transplant, now discontinued  Conditioning with Bu/Flu followed by MUD 10/10 HLA matched, female, allo PBSCT ABO: A+/A+, CMV: neg/neg   Day 30 marrow done 6/6- results pending, chimerism 2%    Today is BMT day +31    Heme  Reviewed CBC, no transfusion needs  Platelet count down to 119 from 143, monitor    ID  No signs of infection today  Immunocompromised  CMV PCR not required both negative    Cholecystitis on abd [place]5/19- completed course of antibiotics  PNA on CTA 5/21- completed course of antibiotics    Viral prophylaxis with acyclovir  Fungal prophylaxis with fluconazole  PCP ppx with Bactrim to start today    IgG 755 last week, checking monthly   May require IVIG in future, if <400 or clinical indication, will follow monthly   Re-immunizations at 1 yr post BMT as able to give     CV/Pulm  A-flutter- cont Cardizem SR 120 mg daily and Metoprolol 50 mg BID  Cardioogy follow up on 6/14    Has oxygen at home, but has not needed it. Reports breathing feels good  Possible OSA, needs sleep study at some point soon      At Risk for GVHD  Planned GVHD prophylaxis with tacrolimus and methotrexate  Full dose MTX given day 1,3,6. Day 11 MTX held due to elevated bilirubin and mucositis  Tacro goal 8-12  Tacro was 2.5 mg BID- will  adjust  Tacro level 15.5, will hold tonight and tomorrow morning dose, resume at 1 mg BID  No signs of GVHD today    Renal  Creatinine today 0.97, stable  Encouraged oral intake    Hypomagnesium  Mag 1.6 today, WNL  IVF with magnesium spaced to every other day  Increased magnesium supplement to 3 tablets daily    Hyperkalemia  Potassium 5.5 today  Kayexalate x 1 eprescribed to preferred pharmacy    GI  Cont protonix  PRN Zofran and Compazine for nausea, occasional use  Weight stable  Eating and drinking well  Denies diarrhea    Mucositis  WHO Grade 1  Using mycelex troches for 2 days now, seems to be helping  Mild coating on tongue  Encouraged good oral care, saline rinses    Derm  Skin hyperpigmentation related to chemotherapy  Discussed with her this is not unexpected    Smoking Cessation  Denies smoking  Not using NRT currently, but has it available  Consider smoking cessation referral if needed      Follow up  BMT clinic: twice weekly, consider spacing visits out to weekly at next appointment    Bloodwork frequency: twice  weekly    Thalia Party, NP    BMT attending [name]  Primary care physician [name]  Lives in [place]

## 2015-06-17 NOTE — Progress Notes (Signed)
Outer port of IVAD de-accessed and Inner port accessed today. Good blood return noted, flushing well. Labs drawn and sent per order. Pt remains accessed. No further issues, pt discharged to clinic.

## 2015-06-17 NOTE — Telephone Encounter (Signed)
Clarified the magnesium po dose.  Instructed to hold 2 doses of tacro for level of 15.5 and restart at 1 mg BID.  Patient stated understanding.

## 2015-06-19 NOTE — Progress Notes (Addendum)
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    PMH, Oncology History & Problem List  Reviewed    Interval History/Review of Systems   c/o persistent right shoulder pain  Woke up Sun morning with sharp pain on RLQ area, since it has subsided and rates it a 5  Dry heaves yesterday morning related to the pain  No BM in the last 2 days, constipated (recieved kayexalate 6/9)  Bilateral edges of tongue red and per pt very sensitive and painful interfering with eating her dentisit gave her mycelex tabs but she hasn't noticed an improvement she has been taking them for 5 days  Weight loss 4.5 lbs since Friday, poor appetite  Patient is happy about the weight loss and wants to loose more weight discussed this not the time to be trying to loose weight, encouraged her to eat more protein and ensure BID     Other pertinent findings checked/documented below  []  fevers  []  palpitations []  urinary frequency   []  chills []  chest pain []  dysuria    []  dry eyes [x]  nausea/emesis  []  rash   []  ear pain []  abdominal cramping []  skin changes   []  sores in mouth []  abdominal discomfort []  pain   []  sore throat []  loose stool, non-formed [x]  fatigue   []  cough  []  diarrhea, watery []  enlarged lymph nodes   []  sputum production  []  weight loss []  bleeding   []  shortness of breath []  insufficient oral intake []  none of the above     Medications  Reviewed medications with patient today & electronically reconciled  Current Outpatient Prescriptions on File Prior to Visit   Medication Sig Dispense Refill    sulfamethoxazole-trimethoprim (BACTRIM DS,SEPTRA DS) 800-160 MG per tablet Take 1 tablet by mouth three times a week 15 tablet 6    MAGNESIUM PO Take 2 tablets by mouth daily         albuterol-ipratropium (COMBIVENT RESPIMAT) 20-100 MCG/ACT inhaler Inhale 1 puff into the lungs 4 times daily 1 Inhaler 3    generic DME Use 2 lpm of oxygen via  nasal cannula continuously 1 each 0    acyclovir (ZOVIRAX) 400 MG tablet Take 1 tablet (400 mg total) by mouth 2 times daily 120 tablet 5    diltiazem (CARDIZEM SR) 120 MG 12 hr capsule Take 2 capsules (240 mg total) by mouth 2 times daily 60 capsule 1    fluconazole (DIFLUCAN) 200 MG tablet Take 1 tablet (200 mg total) by mouth nightly 120 tablet 5    metoprolol (LOPRESSOR) 50 MG tablet Take 1 tablet (50 mg total) by mouth 2 times daily 60 tablet 1    nicotine (NICODERM CQ) 14 MG/24HR patch Place 1 patch onto the skin daily   Remove & discard patch after 24 hours. 21 patch 2    nicotine polacrilex (COMMIT) 2 MG lozenge No more than 5 lozenges in 6 hours or 20 lozenges per 24 hours 108 lozenge 1    pantoprazole (PROTONIX) 40 MG EC tablet Take 1 tablet (40 mg total) by mouth every morning   Swallow whole. Do not crush, break, or chew. 60 tablet 5    ondansetron (ZOFRAN) 4 MG tablet Take 1 tablet (4 mg total) by mouth 3 times daily as needed (nausea) 90 tablet 0    generic DME Magnesium sulfate 4 gm mixed in 561m NS infuse via central over  2-4 hours daily. 14 each 6    tacrolimus (PROGRAF) 1 MG capsule Take 3 capsules (3 mg total) by mouth 2 times daily   And as directed by MD/NP (Patient taking differently: Take 2 mg by mouth 2 times daily   Current dose 2.5 mg a day as of 06/14/15 (no change since d/c)) 180 capsule 6    tacrolimus (PROGRAF) 0.5 MG capsule Take 1 capsule (0.5 mg total) by mouth 2 times daily   And as directed by MD/NP 60 capsule 6    prochlorperazine (COMPAZINE) 10 MG tablet Take 1 tablet (10 mg total) by mouth 4 times daily as needed for Nausea 40 tablet 6    escitalopram (LEXAPRO) 10 MG tablet Take 15 mg by mouth daily       No current facility-administered medications on file prior to visit.        Recent labs  Reviewed, see below    VS, weight, KPS  Reviewed Vital signs, stable    Wt Readings from Last 3 Encounters:   06/17/15 110.5 kg (243 lb 9.6 oz)   06/17/15 110.5 kg (243 lb 9.6  oz)   06/14/15 110.5 kg (243 lb 8 oz)       KPS%: 70    Physical Exam   No acute distress, non-toxic appearing, cooperative with exam  Appropriate affect and dress with normal speech and motor activity  Normocephalic, scalp atraumatic  Conjunctiva & corneas clear, no purulent eye drainage  Lips, oral mucosa and tongue moist, without lesions; no postpharyngeal exudate, scalloping on both side of tongue, redness on bilateral sides (edges) of tongue  Neck is supple, normal skin turgor  Normal S1 & S2, regular, no murmur, rub, or gallop  Respirations unlabored, lung sounds clear to A&P bilateral auscultation  Bowel sounds are normoactive in four quadrants; abdomen is tympanic, soft, non-tender, and without masses or organomegaly  Radial pulses +2 and symmetric, CMS is intact  No lower extremity edema  Skin is without rashes or lesions, darkened due to chemotherapy    Review of EPIC data performed including Laboratory, Pathology, Radiology and clinical records was performed.     Clinical Staging & Grading of Acute Graft-versus-host Disease in Adults1  Organ involvement (maximum stage in prior 3 days)    Skin Stage2 Liver Stage Gut Stage   extent of rash using Rule of N[place] TB  in mg/dlb   volume of diarrhea in ml/day   []  None   [x]  None [x]  None   []  1   BSA <25%   []  1   TB 2-3 []  1   >500 or persistent nauseac   []  2  BSA 25-50%   []  2   TB >3-6 []  2  >1000   []  3   BSA >50%   []  3   TB >6-15 []  3  >1500   []  4   Skin with generalized erythroderma   (diffuse erythema and scaling) & bullous formation []  4   TB >15 []  4   severe abdominal pain with or without ileus     aRule of nine      bDowngrade one stage if an additional cause of elevated bilirubin has been documented.  cPersistent nausea with histologic evidence of GVHD in the stomach or duodenum.     Overall grade    Graded Skin Liver GI   [x]  None  None None None   []  I Stage 1-2 None None   []  II Stage 3  or Stage 1 or Stage 1   []  III --- Stage 2-3 or Stage  2-4   []  IVe Stage 4 Stage 4 ---   dCriteria for grading given as a minimum degree of organ involvement required to confer that grade.  eGrade IV may also include lesser organ involvement with an extreme decrease in performance status.        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 The patient came to clinic today for routine follow up    Atypical MPN  Completed Decitabine 41m/m2 IV daily x 5 days - Cycle # 1 12/15/14  On intermittent/dose adjusted hydroxyurea prior to transplant, now discontinued  Conditioning with Bu/Flu followed by MUD 10/10 HLA matched, female, allo PBSCT ABO: A+/A+, CMV: neg/neg   Day 30 marrow (6/6) hypercellular marrow, no evidence of malignancy, chimerism 2% recipient    Today is BMT day +33    Heme  Reviewed CBC, no transfusion needs    ID  Hx Cholecystitis, pneumonia 05/2015  No signs of infection today  Immunocompromised  CMV PCR not required both negative    Viral prophylaxis with acyclovir  Fungal prophylaxis with fluconazole  PCP ppx with Bactrim (can cause hyperkalemia monitor potassium)    IgG 755 last week, checking monthly   May require IVIG in future, if <400 or clinical indication, will follow monthly   Re-immunizations at 1 yr post BMT as able to give     CV/Pulm  A-flutter- cont Cardizem SR 120 mg daily and Metoprolol 50 mg BID  Cardioogy follow up on 6/14    Has oxygen at home, but has not needed it. Reports breathing feels good  Possible OSA, needs sleep study at some point soon    At Risk for GVHD  Full dose MTX given day 1,3,6. Day 11 MTX held due to elevated bilirubin and mucositis  Tacro goal 8-12  Tacro level 7.5  Increased Tacro dose 1 mg qAM and 1.5 mg QPM (was on 1 mg BID)  No signs of GVHD on exam today    Renal  Creatinine slightly rising 1.02 (cr 0.75 on 6/6)  Encouraged oral intake    Hypomagnesium  Mag 1.6 today, WNL  IVF with magnesium spaced to every other  day  Cont magnesium supplement to 3 tablets daily    Hyperkalemia  Potassium 5.2 today    GI  Cont protonix  PRN Zofran and Compazine for nausea, occasional use  Weight loss, monitor  Constipation advised her take colace BID and add miralax and senna if no BM  RLQ pain, monitor maybe related to constiaption    Mucositis  WHO Grade 1-2  Using mycelex troches for 5 days now, no improvement instructed her to stop using them  Pain on bilateral lateral sides of her tongue interfering with eating  eprescribed dexamethasone s/s TID x7 days to try   Encouraged good oral care, saline rinses    Derm  Skin hyperpigmentation related to chemotherapy  Discussed with her this is not unexpected    L Shoulder pain  Consider x-rays if pain persists    Smoking Cessation  Denies smoking  Not using NRT currently, but has it available  Consider smoking cessation referral if needed      Follow up  BMT clinic: twice weekly, consider spacing visits out to weekly at next appointment    Bloodwork frequency: twice weekly    [facility]  Al Decant, NP    BMT attending [name]  Primary care physician [name]  Lives in [place]

## 2015-06-20 ENCOUNTER — Ambulatory Visit: Payer: Self-pay | Admitting: Adult Health

## 2015-06-20 ENCOUNTER — Ambulatory Visit: Payer: Self-pay | Admitting: Oncology

## 2015-06-20 ENCOUNTER — Telehealth: Payer: Self-pay | Admitting: Hematology

## 2015-06-20 VITALS — BP 129/72 | HR 85 | Temp 98.1°F | Resp 18 | Ht 65.75 in | Wt 240.3 lb

## 2015-06-20 DIAGNOSIS — K5900 Constipation, unspecified: Secondary | ICD-10-CM

## 2015-06-20 DIAGNOSIS — D471 Chronic myeloproliferative disease: Secondary | ICD-10-CM

## 2015-06-20 DIAGNOSIS — K1230 Oral mucositis (ulcerative), unspecified: Secondary | ICD-10-CM

## 2015-06-20 DIAGNOSIS — R1031 Right lower quadrant pain: Secondary | ICD-10-CM

## 2015-06-20 DIAGNOSIS — Z9481 Bone marrow transplant status: Secondary | ICD-10-CM

## 2015-06-20 DIAGNOSIS — M25512 Pain in left shoulder: Secondary | ICD-10-CM

## 2015-06-20 DIAGNOSIS — E875 Hyperkalemia: Secondary | ICD-10-CM

## 2015-06-20 LAB — COMPREHENSIVE METABOLIC PANEL, PL
ALT, PL: 40 U/L — ABNORMAL HIGH (ref 0–35)
AST, PL: 31 U/L (ref 0–35)
Albumin, PL: 4.2 g/dL (ref 3.5–5.2)
Alk Phos, PL: 84 U/L (ref 35–105)
Anion Gap,PL: 18 — ABNORMAL HIGH (ref 7–16)
Bilirubin Total, PL: 1 mg/dL (ref 0.0–1.2)
CO2,Plasma: 22 mmol/L (ref 20–28)
Calcium, PL: 10.4 mg/dL — ABNORMAL HIGH (ref 8.6–10.2)
Chloride,Plasma: 96 mmol/L (ref 96–108)
Creatinine: 1.02 mg/dL — ABNORMAL HIGH (ref 0.51–0.95)
GFR,Black: 72 *
GFR,Caucasian: 62 *
Glucose,Plasma: 138 mg/dL — ABNORMAL HIGH (ref 60–99)
Potassium,Plasma: 5.2 mmol/L — ABNORMAL HIGH (ref 3.4–4.7)
Sodium,Plasma: 136 mmol/L (ref 133–145)
Total Protein, PL: 7.4 g/dL (ref 6.3–7.7)
UN,Plasma: 20 mg/dL (ref 6–20)

## 2015-06-20 LAB — LD, PL: LD, PL: 173 U/L (ref 118–225)

## 2015-06-20 LAB — CBC AND DIFFERENTIAL
Baso # K/uL: 0 10*3/uL (ref 0.0–0.1)
Basophil %: 0.7 %
Eos # K/uL: 0.3 10*3/uL (ref 0.0–0.4)
Eosinophil %: 6.9 %
Hematocrit: 30 % — ABNORMAL LOW (ref 34–45)
Hemoglobin: 9.7 g/dL — ABNORMAL LOW (ref 11.2–15.7)
IMM Granulocytes #: 0.1 10*3/uL (ref 0.0–0.1)
IMM Granulocytes: 1.9 %
Lymph # K/uL: 0.6 10*3/uL — ABNORMAL LOW (ref 1.2–3.7)
Lymphocyte %: 13.3 %
MCH: 28 pg/cell (ref 26–32)
MCHC: 32 g/dL (ref 32–36)
MCV: 88 fL (ref 79–95)
Mono # K/uL: 0.6 10*3/uL (ref 0.2–0.9)
Monocyte %: 13.7 %
Neut # K/uL: 2.7 10*3/uL (ref 1.6–6.1)
Nucl RBC # K/uL: 0 10*3/uL (ref 0.0–0.0)
Nucl RBC %: 0 /100 WBC (ref 0.0–0.2)
Platelets: 158 10*3/uL — ABNORMAL LOW (ref 160–370)
RBC: 3.4 MIL/uL — ABNORMAL LOW (ref 3.9–5.2)
RDW: 21.5 % — ABNORMAL HIGH (ref 11.7–14.4)
Seg Neut %: 63.5 %
WBC: 4.2 10*3/uL (ref 4.0–10.0)

## 2015-06-20 LAB — MAGNESIUM, PLASMA: Magnesium, PL: 1.5 mEq/L (ref 1.3–2.1)

## 2015-06-20 LAB — NEUTROPHIL #-INSTRUMENT: Neutrophil #-Instrument: 2.7 10*3/uL

## 2015-06-20 LAB — TACROLIMUS LEVEL: Tacrolimus: 7.5 ng/mL

## 2015-06-20 NOTE — Progress Notes (Signed)
Here for port draw. IVAD previously accessed, good blood return noted, flushing easily. Labs drawn per order. Patient complaining of right side pain that started yesterday, better today. Writer instructed to tell provider at clinic appointment. Also having intermittent waves of nausea. IVAD remains accessed, dressing clean, dry, and intact. Discharged to clinic in stable condition.

## 2015-06-20 NOTE — Telephone Encounter (Signed)
Pt instructed to increase tacro to 1 mg in AM and 1.5 mg in PM.  Pt instructed to use dex rinse for only 1 week at most. Also instructed to add miralax 1 dose per day as well as adding colace.  Pt reassured that BMBx did not show any leukemia cells.  Pt expressed understanding of instructions and appreciation for the phone call.

## 2015-06-21 LAB — CHROMOSOME ANALYSIS
Cells Analyzed: 7
Cells Counted: 7
Karyo Made: 2

## 2015-06-21 LAB — CHROMOSOMES REVIEW

## 2015-06-22 ENCOUNTER — Ambulatory Visit: Payer: Self-pay

## 2015-06-23 ENCOUNTER — Ambulatory Visit: Payer: Self-pay | Admitting: Hematology

## 2015-06-23 ENCOUNTER — Telehealth: Payer: Self-pay | Admitting: Hematology

## 2015-06-23 ENCOUNTER — Ambulatory Visit: Payer: Self-pay

## 2015-06-23 ENCOUNTER — Telehealth: Payer: Self-pay

## 2015-06-23 VITALS — BP 140/78 | HR 86 | Temp 98.1°F | Resp 18 | Ht 65.75 in | Wt 241.7 lb

## 2015-06-23 DIAGNOSIS — C9200 Acute myeloblastic leukemia, not having achieved remission: Secondary | ICD-10-CM

## 2015-06-23 DIAGNOSIS — D471 Chronic myeloproliferative disease: Secondary | ICD-10-CM

## 2015-06-23 DIAGNOSIS — Z9481 Bone marrow transplant status: Secondary | ICD-10-CM

## 2015-06-23 LAB — CBC AND DIFFERENTIAL
Baso # K/uL: 0 10*3/uL (ref 0.0–0.1)
Basophil %: 0.3 %
Eos # K/uL: 0 10*3/uL (ref 0.0–0.4)
Eosinophil %: 0.5 %
Hematocrit: 28 % — ABNORMAL LOW (ref 34–45)
Hemoglobin: 9.2 g/dL — ABNORMAL LOW (ref 11.2–15.7)
IMM Granulocytes #: 0.2 10*3/uL — ABNORMAL HIGH (ref 0.0–0.1)
IMM Granulocytes: 2.9 %
Lymph # K/uL: 0.5 10*3/uL — ABNORMAL LOW (ref 1.2–3.7)
Lymphocyte %: 7.7 %
MCH: 29 pg/cell (ref 26–32)
MCHC: 33 g/dL (ref 32–36)
MCV: 88 fL (ref 79–95)
Mono # K/uL: 0.5 10*3/uL (ref 0.2–0.9)
Monocyte %: 9.1 %
Neut # K/uL: 4.7 10*3/uL (ref 1.6–6.1)
Nucl RBC # K/uL: 0 10*3/uL (ref 0.0–0.0)
Nucl RBC %: 0 /100 WBC (ref 0.0–0.2)
Platelets: 167 10*3/uL (ref 160–370)
RBC: 3.2 MIL/uL — ABNORMAL LOW (ref 3.9–5.2)
RDW: 20.9 % — ABNORMAL HIGH (ref 11.7–14.4)
Seg Neut %: 79.5 %
WBC: 6 10*3/uL (ref 4.0–10.0)

## 2015-06-23 LAB — TYPE AND SCREEN
ABO RH Blood Type: A POS
Antibody Screen: NEGATIVE

## 2015-06-23 LAB — COMPREHENSIVE METABOLIC PANEL, PL
ALT, PL: 35 U/L (ref 0–35)
AST, PL: 27 U/L (ref 0–35)
Albumin, PL: 4.7 g/dL (ref 3.5–5.2)
Alk Phos, PL: 84 U/L (ref 35–105)
Anion Gap,PL: 17 — ABNORMAL HIGH (ref 7–16)
Bilirubin Total, PL: 0.7 mg/dL (ref 0.0–1.2)
CO2,Plasma: 20 mmol/L (ref 20–28)
Calcium, PL: 9.8 mg/dL (ref 8.6–10.2)
Chloride,Plasma: 98 mmol/L (ref 96–108)
Creatinine: 0.91 mg/dL (ref 0.51–0.95)
GFR,Black: 82 *
GFR,Caucasian: 71 *
Glucose,Plasma: 246 mg/dL — ABNORMAL HIGH (ref 60–99)
Potassium,Plasma: 5.9 mmol/L — ABNORMAL HIGH (ref 3.4–4.7)
Sodium,Plasma: 135 mmol/L (ref 133–145)
Total Protein, PL: 7.7 g/dL (ref 6.3–7.7)
UN,Plasma: 23 mg/dL — ABNORMAL HIGH (ref 6–20)

## 2015-06-23 LAB — TACROLIMUS LEVEL: Tacrolimus: 4.8 ng/mL

## 2015-06-23 LAB — LD, PL: LD, PL: 194 U/L (ref 118–225)

## 2015-06-23 LAB — NEUTROPHIL #-INSTRUMENT: Neutrophil #-Instrument: 4.7 10*3/uL

## 2015-06-23 LAB — MAGNESIUM, PLASMA: Magnesium, PL: 1.7 mEq/L (ref 1.3–2.1)

## 2015-06-23 MED ORDER — FLUDROCORTISONE ACETATE 100 MCG PO TABS *I*
0.1000 mg | ORAL_TABLET | Freq: Every day | ORAL | 2 refills | Status: DC
Start: 2015-06-23 — End: 2015-07-19

## 2015-06-23 NOTE — Telephone Encounter (Signed)
I called and requested lab draw be done on Monday rather than Tuesday next week for the reason K was 5.9 and we started Florinef. [name] would like to see labs on Monday. Mo the scheduler said this will be arranged.   Labs may routinely be done on Tuesday's after that, unless there is a clinical reason to do otherwise.Marland Kitchen

## 2015-06-23 NOTE — Telephone Encounter (Signed)
Note faxed to [facility] requesting they pick up the O2 equipment from patient's home.

## 2015-06-23 NOTE — Progress Notes (Signed)
Pt tolerated port draw well, positive blood return obtained and blood work sent for labs. Port remains accessed for treatment. Discharged home in stable condition.

## 2015-06-23 NOTE — Telephone Encounter (Signed)
Clarified with [facility] no visit needed tomorrow. They asked if Tuesday of next week will be OK as patient's nurse does not work on Monday. I advised this will be OK.

## 2015-06-23 NOTE — Progress Notes (Signed)
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    PMH, Oncology History & Problem List  Reviewed    Interval History/Review of Systems  On her last visit she c/o persistent right shoulder pain. This has now almost resolved.   Her tongue was also very sensitive and painful interfering with eating her dentisit gave her mycelex tabs but she hasdn't noticed an improvement. Therefore last time she was given dexamethasone rinse which she says has worked very well and she is now being able to eat and her weight also came up by 1.5 Lb since her last visit.      Wanted to start driving short distances and hasnt needed any O2 since the first 3 days after her d/c home and hence wanted to have the oxygen taken away from her home.     Other pertinent findings checked/documented below  _0  fevers  _1  palpitations _2  urinary frequency   _3  chills _4  chest pain _5  dysuria    _6  dry eyes _7  nausea/emesis  _8  rash   _9  ear pain _10  abdominal cramping _11  skin changes   _12  sores in mouth _13  abdominal discomfort _14  pain   _15  sore throat _16  loose stool, non-formed _17  fatigue   _18  cough  _19  diarrhea, watery _20  enlarged lymph nodes   _21  sputum production  _22  weight loss _23  bleeding   _24  shortness of breath _25  insufficient oral intake _26  none of the above     Medications  Reviewed medications with patient today & electronically reconciled  Current Outpatient Prescriptions on File Prior to Visit   Medication Sig Dispense Refill    clotrimazole (MYCELEX) 10 MG troche Take 10 mg by mouth 5 times daily   Slowly dissolve in mouth. Do not swallow whole.      NONFORMULARY, OTHER, ORDER *I* Take 5 mLs by mouth 3 times daily   Dexamethasone 0.05% oral rinse 49m swish (3-5 minutes) and spit TID. Dispense: 210 mL      sulfamethoxazole-trimethoprim (BACTRIM DS,SEPTRA DS) 800-160 MG per tablet Take 1 tablet by mouth three times a week 15 tablet 6     MAGNESIUM PO Take 3 tablets by mouth 3 times daily         albuterol-ipratropium (COMBIVENT RESPIMAT) 20-100 MCG/ACT inhaler Inhale 1 puff into the lungs 4 times daily 1 Inhaler 3    generic DME Use 2 lpm of oxygen via nasal cannula continuously 1 each 0    acyclovir (ZOVIRAX) 400 MG tablet Take 1 tablet (400 mg total) by mouth 2 times daily 120 tablet 5    diltiazem (CARDIZEM SR) 120 MG 12 hr capsule Take 2 capsules (240 mg total) by mouth 2 times daily 60 capsule 1    fluconazole (DIFLUCAN) 200 MG tablet Take 1 tablet (200 mg total) by mouth nightly 120 tablet 5    metoprolol (LOPRESSOR) 50 MG tablet Take 1 tablet (50 mg total) by mouth 2 times daily 60 tablet 1    nicotine (NICODERM CQ) 14 MG/24HR patch Place 1 patch onto the skin daily   Remove & discard patch after 24 hours. 21 patch 2    nicotine polacrilex (COMMIT) 2 MG lozenge No more than 5 lozenges in 6 hours or 20 lozenges per 24 hours 108 lozenge 1    pantoprazole (PROTONIX) 40 MG EC tablet Take 1 tablet (40 mg total) by mouth every morning   Swallow whole. Do  not crush, break, or chew. 60 tablet 5    ondansetron (ZOFRAN) 4 MG tablet Take 1 tablet (4 mg total) by mouth 3 times daily as needed (nausea) 90 tablet 0    generic DME Magnesium sulfate 4 gm mixed in 543m NS infuse via central over 2-4 hours daily. 14 each 6    tacrolimus (PROGRAF) 1 MG capsule Take 3 capsules (3 mg total) by mouth 2 times daily   And as directed by MD/NP (Patient taking differently: Take 1 mg by mouth 2 times daily   ) 180 capsule 6    tacrolimus (PROGRAF) 0.5 MG capsule Take 1 capsule (0.5 mg total) by mouth 2 times daily   And as directed by MD/NP 60 capsule 6    prochlorperazine (COMPAZINE) 10 MG tablet Take 1 tablet (10 mg total) by mouth 4 times daily as needed for Nausea 40 tablet 6    escitalopram (LEXAPRO) 10 MG tablet Take 15 mg by mouth daily       No current facility-administered medications on file prior to visit.        Recent labs  Reviewed, see  below    VS, weight, KPS  Reviewed Vital signs, stable    Wt Readings from Last 3 Encounters:   06/23/15 109.6 kg (241 lb 11.2 oz)   06/23/15 109.6 kg (241 lb 11.2 oz)   06/20/15 109 kg (240 lb 4.8 oz)       KPS%: 70    Physical Exam   No acute distress, non-toxic appearing, cooperative with exam  Appropriate affect and dress with normal speech and motor activity  Normocephalic, scalp atraumatic  Conjunctiva & corneas clear, no purulent eye drainage  Lips, oral mucosa and tongue moist, without lesions; no postpharyngeal exudate.  Neck is supple, normal skin turgor  Normal S1 & S2, regular, no murmur, rub, or gallop  Respirations unlabored, lung sounds clear to A&P bilateral auscultation  Bowel sounds are normoactive in four quadrants; abdomen is tympanic, soft, non-tender, and without masses or organomegaly  Radial pulses +2 and symmetric, CMS is intact  No lower extremity edema  Skin is without rashes or lesions, darkened due to chemotherapy    Review of EPIC data performed including Laboratory, Pathology, Radiology and clinical records was performed.     Clinical Staging & Grading of Acute Graft-versus-host Disease in Adults1  Organ involvement (maximum stage in prior 3 days)    Skin Stage2 Liver Stage Gut Stage   extent of rash using Rule of [place] TB  in mg/dlb   volume of diarrhea in ml/day   _0  None   _1  None _2  None   _3  1   BSA <25%   _4  1   TB 2-3 _5  1   >500 or persistent nauseac   _6  2  BSA 25-50%   _7  2   TB >3-6 _8  2  >1000   _9  3   BSA >50%   _10  3   TB >6-15 _11  3  >1500   _12  4   Skin with generalized erythroderma   (diffuse erythema and scaling) & bullous formation _13  4   TB >15 _14  4   severe abdominal pain with or without ileus     aRule of nine      bDowngrade one stage if an additional cause of elevated bilirubin has been documented.  cPersistent nausea with histologic evidence of GVHD in the stomach or duodenum.     Overall grade    Graded  Skin Liver GI   _0  None  None None None   _1  I Stage  1-2 None None   _2  II Stage 3 or Stage 1 or Stage 1   _3  III --- Stage 2-3 or Stage 2-4   _4  IVe Stage 4 Stage 4 ---   dCriteria for grading given as a minimum degree of organ involvement required to confer that grade.  eGrade IV may also include lesser organ involvement with an extreme decrease in performance status.        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 The patient came to clinic today for routine follow up    Atypical MPN  Completed Decitabine 68m/m2 IV daily x 5 days - Cycle # 1 12/15/14  On intermittent/dose adjusted hydroxyurea prior to transplant, now discontinued  Conditioning with Bu/Flu followed by MUD 10/10 HLA matched, female, allo PBSCT ABO: A+/A+, CMV: neg/neg   Day 30 marrow (6/6) hypercellular marrow, no evidence of malignancy, chimerism 2% recipient    Today is BMT day +37    Heme  Reviewed CBC, no transfusion needs    ID  Hx Cholecystitis, pneumonia 05/2015  No signs of infection today  Immunocompromised  CMV PCR not required both negative    Viral prophylaxis with acyclovir  Fungal prophylaxis with fluconazole  PCP ppx with Bactrim (can cause hyperkalemia monitor potassium)    IgG 755 last week, checking monthly   May require IVIG in future, if <400 or clinical indication, will follow monthly   Re-immunizations at 1 yr post BMT as able to give     CV/Pulm  A-flutter- cont Cardizem SR 120 mg daily and Metoprolol 50 mg BID  Cardioogy follow up on 6/14    Has oxygen at home, but has not needed it. She will call home care and have the O2 taken out from her home. Reports breathing feels good  Possible OSA, needs sleep study at some point soon.     At Risk for GVHD  Full dose MTX given day 1,3,6. Day 11 MTX held due to elevated bilirubin and mucositis  Tacro goal 8-12  Tacro level 7.5 last time and so Increased Tacro dose 1 mg qAM and 1.5 mg QPM (was on 1 mg BID).  We will call her back today with  her tacro results if she needs further dose adjustments  No signs of GVHD on exam today    Renal  Creatinine better @ 0.91   Encouraged oral intake    Hypomagnesium  Mag 1.7 today, WNL  IVF with magnesium spaced to every other day and magnesium supplement increased to 4 tablets daily. If she tolerates them for another week then we may be able to stop the IV magnesium.     Hyperkalemia  Potassium 5.9 today. She has been high consistently. Given instructions for dietary modifications and now will start florinef for 4 days and recheck labs on Monday to see how her K does. With her Cr improving I think this will work better than K escalate.     GI  Cont protonix  PRN Zofran and Compazine for nausea, occasional use  Weight loss, monitor  Constipation advised her take colace BID and add miralax and senna if no BM    Mucositis  WHO Grade 1-2  Initially used mycelex troches for 5 days now, no improvement instructed her to stop using them  Her pain on  bilateral lateral sides of her tongue interfering with eating eprescribed dexamethasone s/s TID x7 days to try which worked very well and she almost is having no symptoms now.   Encouraged good oral care, saline rinses    Derm  Skin hyperpigmentation related to chemotherapy  Discussed with her this is not unexpected    L Shoulder pain  Resolved. Was likely MSK.     Smoking Cessation  Denies smoking  Not using NRT currently, but has it available  Consider smoking cessation referral if needed    Follow up  BMT clinic: twice weekly, consider spacing visits out to weekly at next appointment    Bloodwork frequency: twice weekly    Case discussed with Dr Lytle Butte.     Wynona Dove, MD    BMT attending [name]  Primary care physician [name]  Lives in [place]

## 2015-06-23 NOTE — Telephone Encounter (Signed)
Per [name]- increase Tacrolimus from 1 mg in the am and 1.5 mg in the pm to 1.5 mg twice daily.  Ceona stated clear understanding. She is aware she will have a home nurse visit on Monday for lab draw.

## 2015-06-27 ENCOUNTER — Telehealth: Payer: Self-pay | Admitting: Hematology

## 2015-06-27 DIAGNOSIS — Z9481 Bone marrow transplant status: Secondary | ICD-10-CM

## 2015-06-27 LAB — SURGICAL PATHOLOGY

## 2015-06-27 MED ORDER — NONFORMULARY (OTHER) ORDER *I*
0 refills | Status: DC
Start: 2015-06-27 — End: 2015-07-01

## 2015-06-27 NOTE — Telephone Encounter (Signed)
Script sent to [phone]. Confirmation received.

## 2015-06-27 NOTE — Telephone Encounter (Signed)
Fax request to pick up O2 equipment was sent to [facility].Confirmation obtained. I spoke to [place] this morning  and they denies receipt.  Script will be faxed alter today.

## 2015-06-28 ENCOUNTER — Telehealth: Payer: Self-pay | Admitting: Hematology

## 2015-06-28 ENCOUNTER — Other Ambulatory Visit: Payer: Self-pay | Admitting: Oncology

## 2015-06-28 ENCOUNTER — Other Ambulatory Visit: Payer: Self-pay

## 2015-06-28 DIAGNOSIS — D471 Chronic myeloproliferative disease: Secondary | ICD-10-CM

## 2015-06-28 DIAGNOSIS — Z9481 Bone marrow transplant status: Secondary | ICD-10-CM

## 2015-06-28 LAB — LACTATE DEHYDROGENASE: LD: 165 mmol/L (ref 118–225)

## 2015-06-28 LAB — CBC AND DIFFERENTIAL
Baso # K/uL: 0.1 10*3/uL (ref 0.0–0.1)
Basophil %: 0.6 % (ref 0.1–1.2)
Eos # K/uL: 0.3 10*3/uL (ref 0.0–0.4)
Eosinophil %: 3.1 % (ref 0.7–5.8)
Hematocrit: 29.5 % — ABNORMAL LOW (ref 34.1–44.9)
Hemoglobin: 10.1 g/dL — ABNORMAL LOW (ref 11.2–15.7)
Immature Granulocytes Absolute: 0.2 10*3/uL (ref 0.0–0.2)
Immature Granulocytes: 2.5 % — ABNORMAL HIGH (ref 0.0–2.0)
Lymph # K/uL: 0.7 10*3/uL — ABNORMAL LOW (ref 1.2–3.7)
Lymphocyte %: 9.1 % — ABNORMAL LOW (ref 19.3–51.7)
MCH: 29.4 pg (ref 25.6–32.2)
MCHC: 34.2 g/dL (ref 32.2–35.5)
MCV: 86 fL (ref 79.4–94.8)
Mono # K/uL: 0.8 10*3/uL (ref 0.2–0.9)
Monocyte %: 9.9 % (ref 4.7–12.5)
Neut # K/uL: 5.9 10*3/uL (ref 1.6–6.0)
Nucl RBC # K/uL: 0 /100 WBC (ref 0.0–0.2)
Platelets: 192 10*3/uL (ref 182–369)
RBC Distribution Width-SD: 62.4 fL — ABNORMAL HIGH (ref 36.4–46.3)
RBC: 3.43 10*6/uL — ABNORMAL LOW (ref 3.93–5.22)
RDW: 20.3 % — ABNORMAL HIGH (ref 11.7–14.4)
Seg Neut %: 74.8 % — ABNORMAL HIGH (ref 34.0–71.1)
WBC: 7.9 10*3/uL (ref 4.0–10.0)

## 2015-06-28 LAB — COMPREHENSIVE METABOLIC PANEL
ALT: 33 U/L (ref 0–35)
AST: 25 U/L (ref 0–35)
Albumin: 4.4 mg/dL (ref 3.5–5.2)
Alk Phos: 97 U/L (ref 35–105)
Anion Gap: 18 — ABNORMAL HIGH (ref 7–16)
Bilirubin,Total: 0.6 mg/dL (ref 0.0–1.2)
CO2: 21 mmol/L (ref 20–28)
Calcium: 9.5 mg/dL (ref 8.6–10.2)
Chloride: 97 mmol/L (ref 96–108)
Creatinine: 1.01 mg/dL — ABNORMAL HIGH (ref 0.51–0.95)
GFR,Black: 69 mL/min/{1.73_m2}
GFR,Caucasian: 57 mL/min/{1.73_m2}
Glucose: 110 mg/dL — ABNORMAL HIGH (ref 60–99)
Lab: 32 mg/dL — ABNORMAL HIGH (ref 6–20)
Potassium: 4.9 mmol/L — ABNORMAL HIGH (ref 3.4–4.7)
Sodium: 136 mmol/L (ref 133–145)
Total Protein: 7.8 g/dL — ABNORMAL HIGH (ref 6.3–7.7)

## 2015-06-28 LAB — MAGNESIUM: Magnesium: 2 mg/dL (ref 1.56–2.52)

## 2015-06-28 NOTE — Telephone Encounter (Signed)
Let Carly Higgins know her results are not in yet. We should have them in the morning. I will call her with changes.

## 2015-06-29 LAB — TACROLIMUS LEVEL: Tacrolimus: 4.9 ng/mL

## 2015-06-29 NOTE — Telephone Encounter (Signed)
Per [name] NP: Increase Tacrolimus from 1.5 mg twice daily to 2 mg twice daily.  I spoke to Carly Higgins and she stated understanding.

## 2015-06-29 NOTE — Telephone Encounter (Signed)
Counts and Chems stable to improved. Awaiting Tacro level.

## 2015-06-30 ENCOUNTER — Encounter: Payer: Self-pay | Admitting: Gastroenterology

## 2015-07-01 ENCOUNTER — Encounter: Payer: Self-pay | Admitting: Internal Medicine

## 2015-07-01 ENCOUNTER — Ambulatory Visit: Payer: Self-pay

## 2015-07-01 ENCOUNTER — Ambulatory Visit: Payer: Self-pay | Admitting: Internal Medicine

## 2015-07-01 VITALS — BP 108/67 | HR 86 | Temp 97.7°F | Resp 18 | Ht 65.75 in | Wt 241.6 lb

## 2015-07-01 DIAGNOSIS — Z9481 Bone marrow transplant status: Secondary | ICD-10-CM

## 2015-07-01 DIAGNOSIS — D471 Chronic myeloproliferative disease: Secondary | ICD-10-CM

## 2015-07-01 DIAGNOSIS — D849 Immunodeficiency, unspecified: Secondary | ICD-10-CM

## 2015-07-01 DIAGNOSIS — E875 Hyperkalemia: Secondary | ICD-10-CM

## 2015-07-01 LAB — CBC AND DIFFERENTIAL
Baso # K/uL: 0 10*3/uL (ref 0.0–0.1)
Basophil %: 0.3 %
Eos # K/uL: 0.1 10*3/uL (ref 0.0–0.4)
Eosinophil %: 3.5 %
Hematocrit: 28 % — ABNORMAL LOW (ref 34–45)
Hemoglobin: 8.8 g/dL — ABNORMAL LOW (ref 11.2–15.7)
IMM Granulocytes #: 0.2 10*3/uL — ABNORMAL HIGH (ref 0.0–0.1)
IMM Granulocytes: 4.8 %
Lymph # K/uL: 0.4 10*3/uL — ABNORMAL LOW (ref 1.2–3.7)
Lymphocyte %: 9.8 %
MCH: 28 pg/cell (ref 26–32)
MCHC: 31 g/dL — ABNORMAL LOW (ref 32–36)
MCV: 90 fL (ref 79–95)
Mono # K/uL: 0.4 10*3/uL (ref 0.2–0.9)
Monocyte %: 9.8 %
Neut # K/uL: 2.8 10*3/uL (ref 1.6–6.1)
Nucl RBC # K/uL: 0 10*3/uL (ref 0.0–0.0)
Nucl RBC %: 0 /100 WBC (ref 0.0–0.2)
Platelets: 134 10*3/uL — ABNORMAL LOW (ref 160–370)
RBC: 3.1 MIL/uL — ABNORMAL LOW (ref 3.9–5.2)
RDW: 20.2 % — ABNORMAL HIGH (ref 11.7–14.4)
Seg Neut %: 71.8 %
WBC: 4 10*3/uL (ref 4.0–10.0)

## 2015-07-01 LAB — COMPREHENSIVE METABOLIC PANEL, PL
ALT, PL: 61 U/L — ABNORMAL HIGH (ref 0–35)
AST, PL: 43 U/L — ABNORMAL HIGH (ref 0–35)
Albumin, PL: 4.3 g/dL (ref 3.5–5.2)
Alk Phos, PL: 86 U/L (ref 35–105)
Anion Gap,PL: 17 — ABNORMAL HIGH (ref 7–16)
Bilirubin Total, PL: 0.6 mg/dL (ref 0.0–1.2)
CO2,Plasma: 21 mmol/L (ref 20–28)
Calcium, PL: 9.9 mg/dL (ref 8.6–10.2)
Chloride,Plasma: 100 mmol/L (ref 96–108)
Creatinine: 0.81 mg/dL (ref 0.51–0.95)
GFR,Black: 95 *
GFR,Caucasian: 82 *
Glucose,Plasma: 132 mg/dL — ABNORMAL HIGH (ref 60–99)
Potassium,Plasma: 5 mmol/L — ABNORMAL HIGH (ref 3.4–4.7)
Sodium,Plasma: 138 mmol/L (ref 133–145)
Total Protein, PL: 7 g/dL (ref 6.3–7.7)
UN,Plasma: 22 mg/dL — ABNORMAL HIGH (ref 6–20)

## 2015-07-01 LAB — LD, PL: LD, PL: 168 U/L (ref 118–225)

## 2015-07-01 LAB — NEUTROPHIL #-INSTRUMENT: Neutrophil #-Instrument: 2.8 10*3/uL

## 2015-07-01 LAB — MAGNESIUM, PLASMA: Magnesium, PL: 1.5 mEq/L (ref 1.3–2.1)

## 2015-07-01 LAB — TACROLIMUS LEVEL: Tacrolimus: 7.6 ng/mL

## 2015-07-01 NOTE — Progress Notes (Signed)
Outer port de-accessed and inner port accessed. Good blood return noted, flushing well. Labs drawn and sent per order. Pt remains accessed at this time. Pt discharged to clinic.

## 2015-07-01 NOTE — Treatment Plan (Addendum)
Pt back to infusion center to deaccess port. Tolerated well, next appointment set.

## 2015-07-01 NOTE — Progress Notes (Signed)
[facility] Clinic Progress Note    CC/HPI  Myeloproliferative neoplasm, MUD allo day +45, immunocompromised, at risk for GVHD    PMH, Oncology History & Problem List  Reviewed    Interval History/Review of Systems  - overall continues to improve; feels energy level and appetite are better.  Still with taste changes  - increased PO Mg 4 tabs daily, discontinued IVF Mg  - using zofran approx ~3x week   - cancelled cardiology follow up; advised to reschedule this    Other pertinent findings checked/documented below  _0  fevers  _1  palpitations _2  urinary frequency   _3  chills _4  chest pain _5  dysuria    _6  dry eyes _7  nausea - mild _8  rash   _9  ear pain _10  abdominal cramping _11  skin changes   _12  taste changes _13  abdominal discomfort _14  pain   _15  sore throat _16  loose stool, non-formed _17  fatigue   _18  cough  _19  diarrhea, watery _20  enlarged lymph nodes   _21  sputum production  _22  weight loss _23  bleeding   _24  shortness of breath _25  insufficient oral intake _26  none of the above     Medications  Reviewed medications with patient today & electronically reconciled  Current Outpatient Prescriptions on File Prior to Visit   Medication Sig Dispense Refill    NONFORMULARY, OTHER, ORDER *I* Home O2 is discontinued. Please remove equipment from patient's home. 1 each 0    fludrocortisone (FLORINEF) 0.1 mg tablet Take 1 tablet (0.1 mg total) by mouth daily 4 tablet 2    NONFORMULARY, OTHER, ORDER *I* Take 5 mLs by mouth 3 times daily   Dexamethasone 0.05% oral rinse 43m swish (3-5 minutes) and spit TID. Dispense: 210 mL      sulfamethoxazole-trimethoprim (BACTRIM DS,SEPTRA DS) 800-160 MG per tablet Take 1 tablet by mouth three times a week 15 tablet 6    MAGNESIUM PO Take 2 tablets by mouth 2 times daily         albuterol-ipratropium (COMBIVENT RESPIMAT) 20-100 MCG/ACT inhaler Inhale 1 puff into the lungs 4 times daily 1 Inhaler 3    generic DME Use 2 lpm of oxygen via nasal cannula continuously 1  each 0    acyclovir (ZOVIRAX) 400 MG tablet Take 1 tablet (400 mg total) by mouth 2 times daily 120 tablet 5    diltiazem (CARDIZEM SR) 120 MG 12 hr capsule Take 2 capsules (240 mg total) by mouth 2 times daily 60 capsule 1    fluconazole (DIFLUCAN) 200 MG tablet Take 1 tablet (200 mg total) by mouth nightly 120 tablet 5    metoprolol (LOPRESSOR) 50 MG tablet Take 1 tablet (50 mg total) by mouth 2 times daily 60 tablet 1    nicotine (NICODERM CQ) 14 MG/24HR patch Place 1 patch onto the skin daily   Remove & discard patch after 24 hours. 21 patch 2    nicotine polacrilex (COMMIT) 2 MG lozenge No more than 5 lozenges in 6 hours or 20 lozenges per 24 hours 108 lozenge 1    pantoprazole (PROTONIX) 40 MG EC tablet Take 1 tablet (40 mg total) by mouth every morning   Swallow whole. Do not crush, break, or chew. 60 tablet 5    ondansetron (ZOFRAN) 4 MG tablet Take 1 tablet (4 mg total) by mouth 3 times daily as needed (nausea) 90 tablet 0    generic DME Magnesium sulfate 4 gm mixed in 5071mNS infuse via central over 2-4 hours daily. [place]  each 6    tacrolimus (PROGRAF) 1 MG capsule Take 3 capsules (3 mg total) by mouth 2 times daily   And as directed by MD/NP (Patient taking differently: Take 2 mg by mouth 2 times daily   ) 180 capsule 6    tacrolimus (PROGRAF) 0.5 MG capsule Take 1 capsule (0.5 mg total) by mouth 2 times daily   And as directed by MD/NP 60 capsule 6    prochlorperazine (COMPAZINE) 10 MG tablet Take 1 tablet (10 mg total) by mouth 4 times daily as needed for Nausea 40 tablet 6    escitalopram (LEXAPRO) 10 MG tablet Take 15 mg by mouth daily       No current facility-administered medications on file prior to visit.        Recent labs  Reviewed, see below    VS, weight, KPS  There were no vitals taken for this visit.    Wt Readings from Last 3 Encounters:   06/23/15 109.6 kg (241 lb 11.2 oz)   06/23/15 109.6 kg (241 lb 11.2 oz)   06/20/15 109 kg (240 lb 4.8 oz)       KPS%: 60    Physical Exam   No  acute distress, chronically ill appearing, cooperative with exam  Appropriate affect and dress with normal speech and motor activity  Normocephalic, scalp atraumatic  Conjunctiva & corneas clear, no purulent eye drainage  Lips, oral mucosa and tongue moist, without lesions; no postpharyngeal exudate  Neck is supple, normal skin turgor  Normal S1 & S2, regular, no murmur, rub, or gallop  Respirations unlabored, lung sounds clear to A&P bilateral auscultation  Bowel sounds are normoactive in four quadrants; abdomen is tympanic, soft, non-tender, and without masses or organomegaly  Radial pulses +2 and symmetric, CMS is intact  No lower extremity edema  Skin is without rashes or lesions    Review of EPIC data performed including Laboratory, Pathology, Radiology and clinical records was performed.     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 The patient came to clinic today for routine follow up    Atypical MPN  Completed Decitabine 43m/m2 IV daily x 5 days - Cycle # 1 12/15/14  On intermittent/dose adjusted hydroxyurea prior to transplant, now discontinued  Conditioning with Bu/Flu followed by MUD 10/10 HLA matched, female, allo PBSCT ABO: A+/A+, CMV: neg/neg   Day 30 marrow (6/6) hypercellular marrow, no evidence of malignancy, chimerism 2% recipient    Today is BMT day +45    Heme  Reviewed CBC, HCT 28, Plts 134, ANC 2800 - no need for transfusions    ID  Hx Cholecystitis, pneumonia 05/2015  Immunocompromised  CMV PCR not required both negative    Viral prophylaxis with acyclovir  Fungal prophylaxis with fluconazole  PCP ppx with Bactrim (can cause hyperkalemia monitor potassium)    IgG level 6/1 755  May require IVIG in future, if <400 or clinical indication, will follow monthly     CV/Pulm  A-flutter- cont Cardizem SR 120 mg daily and Metoprolol 50 mg BID  Cancelled follow up with cardiology - advised to  reschedule    Has oxygen at home, but has not needed it. Reports breathing feels good  Possible OSA, needs sleep study at some point soon    At Risk for GVHD  Full dose MTX given day 1,3,6. Day 11 MTX held due to elevated bilirubin and  mucositis  Tacro goal 8-12  Tacro level 7.6  Tacro dose: 41m BID, no dose adjustment  No signs of GVHD on exam today    Renal  Creatinine: 0.81    Hypomagnesium  Mag: 1.5  Increase magnesium supplement to 4 tablets daily  Will discontinue IV Mg    Hyperkalemia  Potassium: 5.0, taking Florinef daily    GI  Cont protonix  PRN Zofran and Compazine for nausea, occasional use  Weight loss, monitor  Constipation advised her take colace BID and add miralax and senna if no BM  RLQ pain, monitor maybe related to constiaption    Mucositis  WHO Grade 1-2; now resolved  eprescribed dexamethasone s/s TID x7 - worked well, course finished  Encouraged good oral care, saline rinses    Derm  Skin hyperpigmentation related to chemotherapy  Discussed with her this is not unexpected    Smoking Cessation  Denies smoking  Not using NRT currently, but has it available  Consider smoking cessation referral if needed    Follow up  BMT clinic: space visits to weekly at next appointment   Bloodwork frequency: twice weekly    JJarome Lamas NP    BMT attending Dr. LLytle Butte Primary care physician Dr. CLenn Cal Lives in [place]

## 2015-07-05 ENCOUNTER — Telehealth: Payer: Self-pay | Admitting: Hematology

## 2015-07-05 ENCOUNTER — Other Ambulatory Visit: Payer: Self-pay | Admitting: Hematology

## 2015-07-05 LAB — COMPREHENSIVE METABOLIC PANEL
ALT: 44 U/L — ABNORMAL HIGH (ref 0–35)
AST: 29 U/L (ref 0–35)
Albumin: 4.1 mg/dL (ref 3.5–5.2)
Alk Phos: 99 U/L (ref 35–105)
Anion Gap: 16 (ref 7–16)
Bilirubin,Total: 0.6 mg/dL (ref 0.0–1.2)
CO2: 22 mmol/L (ref 20–28)
Calcium: 9.7 mg/dL (ref 8.6–10.2)
Chloride: 98 mmol/L (ref 96–108)
Creatinine: 1.26 mg/dL — ABNORMAL HIGH (ref 0.51–0.95)
GFR,Black: 53 mL/min/{1.73_m2}
GFR,Caucasian: 44 mL/min/{1.73_m2}
Glucose: 163 mg/dL — ABNORMAL HIGH (ref 60–99)
Lab: 23 mg/dL — ABNORMAL HIGH (ref 6–20)
Potassium: 5.6 mmol/L — ABNORMAL HIGH (ref 3.4–4.7)
Sodium: 136 mmol/L (ref 133–145)
Total Protein: 7.2 g/dL (ref 6.3–7.7)

## 2015-07-05 LAB — CBC AND DIFFERENTIAL
Baso # K/uL: 0 10*3/uL (ref 0.0–0.1)
Basophil %: 0.6 % (ref 0.1–1.2)
Eos # K/uL: 0.2 10*3/uL (ref 0.0–0.4)
Eosinophil %: 3.6 % (ref 0.7–5.8)
Hematocrit: 27.5 % — ABNORMAL LOW (ref 34.1–44.9)
Hemoglobin: 9.1 g/dL — ABNORMAL LOW (ref 11.2–15.7)
Immature Granulocytes Absolute: 0.18 10*3/uL (ref 0.0–0.2)
Immature Granulocytes: 3.6 % — ABNORMAL HIGH (ref 0.0–2.0)
Lymph # K/uL: 0.5 10*3/uL — ABNORMAL LOW (ref 1.2–3.7)
Lymphocyte %: 9.5 % — ABNORMAL LOW (ref 19.3–51.7)
MCH: 29.1 pg (ref 25.6–32.2)
MCHC: 33.1 g/dL (ref 32.2–35.5)
MCV: 87.9 fL (ref 79.4–94.8)
Mono # K/uL: 0.5 10*3/uL (ref 0.2–0.9)
Monocyte %: 9.7 % (ref 4.7–12.5)
Neut # K/uL: 3.7 10*3/uL (ref 1.6–6.0)
Nucl RBC # K/uL: 0 /100 WBC (ref 0.0–0.2)
Platelets: 158 10*3/uL — ABNORMAL LOW (ref 182–369)
RBC Distribution Width-SD: 62.1 fL — ABNORMAL HIGH (ref 36.4–46.3)
RBC: 3.13 10*6/uL — ABNORMAL LOW (ref 3.93–5.22)
RDW: 19.5 % — ABNORMAL HIGH (ref 11.7–14.4)
Seg Neut %: 73 % — ABNORMAL HIGH (ref 34.0–71.1)
WBC: 5.1 10*3/uL (ref 4.0–10.0)

## 2015-07-05 LAB — LACTATE DEHYDROGENASE: LD: 162 mmol/L (ref 118–225)

## 2015-07-05 LAB — MAGNESIUM: Magnesium: 1.8 mg/dL (ref 1.56–2.52)

## 2015-07-05 NOTE — Telephone Encounter (Signed)
I advised Carly Higgins with Home Care that nursing services should continue until,at least, around day 100. Will be update at that time.

## 2015-07-06 ENCOUNTER — Telehealth: Payer: Self-pay

## 2015-07-06 LAB — TACROLIMUS LEVEL: Tacrolimus: 19 ng/mL

## 2015-07-06 NOTE — Telephone Encounter (Signed)
Patient confirms she took Tacrolimus before the lab draw- hence the high level.  She will remember to hold for the draw tomorrow.

## 2015-07-07 ENCOUNTER — Ambulatory Visit: Payer: Self-pay

## 2015-07-07 ENCOUNTER — Ambulatory Visit: Payer: Self-pay | Admitting: Oncology

## 2015-07-07 ENCOUNTER — Telehealth: Payer: Self-pay | Admitting: Hematology

## 2015-07-07 ENCOUNTER — Telehealth: Payer: Self-pay

## 2015-07-07 VITALS — BP 116/56 | HR 94 | Temp 97.7°F | Resp 18 | Ht 65.75 in | Wt 243.3 lb

## 2015-07-07 DIAGNOSIS — D471 Chronic myeloproliferative disease: Secondary | ICD-10-CM

## 2015-07-07 DIAGNOSIS — Z9481 Bone marrow transplant status: Secondary | ICD-10-CM

## 2015-07-07 LAB — CBC AND DIFFERENTIAL
Baso # K/uL: 0 10*3/uL (ref 0.0–0.1)
Basophil %: 0.4 %
Eos # K/uL: 0.2 10*3/uL (ref 0.0–0.4)
Eosinophil %: 3.3 %
Hematocrit: 27 % — ABNORMAL LOW (ref 34–45)
Hemoglobin: 8.8 g/dL — ABNORMAL LOW (ref 11.2–15.7)
IMM Granulocytes #: 0.1 10*3/uL (ref 0.0–0.1)
IMM Granulocytes: 3.1 %
Lymph # K/uL: 0.5 10*3/uL — ABNORMAL LOW (ref 1.2–3.7)
Lymphocyte %: 10.1 %
MCH: 29 pg/cell (ref 26–32)
MCHC: 33 g/dL (ref 32–36)
MCV: 88 fL (ref 79–95)
Mono # K/uL: 0.6 10*3/uL (ref 0.2–0.9)
Monocyte %: 12.8 %
Neut # K/uL: 3.2 10*3/uL (ref 1.6–6.1)
Nucl RBC # K/uL: 0 10*3/uL (ref 0.0–0.0)
Nucl RBC %: 0 /100 WBC (ref 0.0–0.2)
Platelets: 134 10*3/uL — ABNORMAL LOW (ref 160–370)
RBC: 3 MIL/uL — ABNORMAL LOW (ref 3.9–5.2)
RDW: 19.3 % — ABNORMAL HIGH (ref 11.7–14.4)
Seg Neut %: 70.3 %
WBC: 4.5 10*3/uL (ref 4.0–10.0)

## 2015-07-07 LAB — COMPREHENSIVE METABOLIC PANEL, PL
ALT, PL: 47 U/L — ABNORMAL HIGH (ref 0–35)
AST, PL: 29 U/L (ref 0–35)
Albumin, PL: 4.3 g/dL (ref 3.5–5.2)
Alk Phos, PL: 100 U/L (ref 35–105)
Anion Gap,PL: 17 — ABNORMAL HIGH (ref 7–16)
Bilirubin Total, PL: 0.5 mg/dL (ref 0.0–1.2)
CO2,Plasma: 22 mmol/L (ref 20–28)
Calcium, PL: 10.3 mg/dL — ABNORMAL HIGH (ref 8.6–10.2)
Chloride,Plasma: 99 mmol/L (ref 96–108)
Creatinine: 1.35 mg/dL — ABNORMAL HIGH (ref 0.51–0.95)
GFR,Black: 51 * — AB
GFR,Caucasian: 44 * — AB
Glucose,Plasma: 142 mg/dL — ABNORMAL HIGH (ref 60–99)
Potassium,Plasma: 5.7 mmol/L — ABNORMAL HIGH (ref 3.4–4.7)
Sodium,Plasma: 138 mmol/L (ref 133–145)
Total Protein, PL: 7.2 g/dL (ref 6.3–7.7)
UN,Plasma: 25 mg/dL — ABNORMAL HIGH (ref 6–20)

## 2015-07-07 LAB — TACROLIMUS LEVEL: Tacrolimus: 9.8 ng/mL

## 2015-07-07 LAB — NEUTROPHIL #-INSTRUMENT: Neutrophil #-Instrument: 3.2 10*3/uL

## 2015-07-07 LAB — MAGNESIUM, PLASMA: Magnesium, PL: 1.4 mEq/L (ref 1.3–2.1)

## 2015-07-07 LAB — LD, PL: LD, PL: 176 U/L (ref 118–225)

## 2015-07-07 MED ORDER — DILTIAZEM HCL CR 120 MG PO CP12 *I*
240.0000 mg | ORAL_CAPSULE | Freq: Two times a day (BID) | ORAL | 1 refills | Status: DC
Start: 2015-07-07 — End: 2015-08-25

## 2015-07-07 MED ORDER — PANTOPRAZOLE SODIUM 40 MG PO TBEC *I*
40.0000 mg | DELAYED_RELEASE_TABLET | Freq: Two times a day (BID) | ORAL | 5 refills | Status: DC
Start: 2015-07-07 — End: 2015-12-29

## 2015-07-07 NOTE — Progress Notes (Signed)
[facility] Clinic Progress Note    CC/HPI  Myeloproliferative neoplasm, MUD allo day +45, immunocompromised, at risk for GVHD    PMH, Oncology History & Problem List  Reviewed    Interval History/Review of Systems  Feeling good overall   Good energy  Still does not have cardiology follow up   Having a lot of reflux- confirms taking protonix once daily  Tolerating magnesium PO  Denies any fever, cp, palpitations, sob, ha, vision changes, tremor, diarrhea or abdominal pain    Other pertinent findings checked/documented below  _0  fevers  _1  palpitations _2  urinary frequency   _3  chills _4  chest pain _5  dysuria    _6  dry eyes _7  nausea/emesis  _8  rash   _9  ear pain _10  abdominal cramping _11  skin changes   _12  sores in mouth _13  abdominal discomfort _14  pain   _15  sore throat _16  loose stool, non-formed _17  fatigue   _18  cough  _19  diarrhea, watery _20  enlarged lymph nodes   _21  sputum production  _22  weight loss _23  bleeding   _24  shortness of breath _25  insufficient oral intake _26  none of the above     Medications  Reviewed medications with patient today & electronically reconciled  Current Outpatient Prescriptions on File Prior to Visit   Medication Sig Dispense Refill    fludrocortisone (FLORINEF) 0.1 mg tablet Take 1 tablet (0.1 mg total) by mouth daily 4 tablet 2    sulfamethoxazole-trimethoprim (BACTRIM DS,SEPTRA DS) 800-160 MG per tablet Take 1 tablet by mouth three times a week 15 tablet 6    MAGNESIUM PO Take 2 tablets by mouth 2 times daily         albuterol-ipratropium (COMBIVENT RESPIMAT) 20-100 MCG/ACT inhaler Inhale 1 puff into the lungs 4 times daily 1 Inhaler 3    acyclovir (ZOVIRAX) 400 MG tablet Take 1 tablet (400 mg total) by mouth 2 times daily 120 tablet 5    diltiazem (CARDIZEM SR) 120 MG 12 hr capsule Take 2 capsules (240 mg total) by mouth 2 times daily 60 capsule 1    fluconazole (DIFLUCAN) 200 MG tablet Take 1 tablet (200 mg total) by mouth nightly 120 tablet 5    metoprolol  (LOPRESSOR) 50 MG tablet Take 1 tablet (50 mg total) by mouth 2 times daily 60 tablet 1    nicotine (NICODERM CQ) 14 MG/24HR patch Place 1 patch onto the skin daily   Remove & discard patch after 24 hours. 21 patch 2    nicotine polacrilex (COMMIT) 2 MG lozenge No more than 5 lozenges in 6 hours or 20 lozenges per 24 hours 108 lozenge 1    pantoprazole (PROTONIX) 40 MG EC tablet Take 1 tablet (40 mg total) by mouth every morning   Swallow whole. Do not crush, break, or chew. 60 tablet 5    ondansetron (ZOFRAN) 4 MG tablet Take 1 tablet (4 mg total) by mouth 3 times daily as needed (nausea) 90 tablet 0    tacrolimus (PROGRAF) 0.5 MG capsule Take 1 capsule (0.5 mg total) by mouth 2 times daily   And as directed by MD/NP 60 capsule 6    prochlorperazine (COMPAZINE) 10 MG tablet Take 1 tablet (10 mg total) by mouth 4 times daily as needed for Nausea 40 tablet 6    escitalopram (LEXAPRO) 10 MG tablet Take 15 mg by mouth daily       No current facility-administered medications on file prior to visit.  Recent labs  Reviewed, see below    VS, weight, KPS  There were no vitals taken for this visit.    Wt Readings from Last 3 Encounters:   07/07/15 110.4 kg (243 lb 4.8 oz)   07/01/15 109.6 kg (241 lb 9.6 oz)   07/01/15 109.6 kg (241 lb 9.6 oz)       KPS%: 90    Physical Exam   No acute distress, non-toxic appearing, cooperative with exam  Appropriate affect and dress with normal speech and motor activity  Normocephalic, scalp atraumatic  Conjunctiva & corneas clear, no purulent eye drainage  Lips, oral mucosa and tongue moist, without lesions; no postpharyngeal exudate  Neck is supple, normal skin turgor  Normal S1 & S2, regular, no murmur, rub, or gallop  Respirations unlabored, lung sounds clear to A&P bilateral auscultation. mediport site is clean and dry  Bowel sounds are normoactive in four quadrants; abdomen is tympanic, soft, non-tender, and without masses or organomegaly  Radial pulses +2 and symmetric,  CMS is intact  No lower extremity edema  Skin is without rashes or lesions    Review of EPIC data performed including Laboratory, Pathology, Radiology and clinical records was performed.       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 The patient came to clinic today for routine follow up    Atypical MPN  Completed Decitabine 70m/m2 IV daily x 5 days - Cycle # 1 12/15/14  On intermittent/dose adjusted hydroxyurea prior to transplant, now discontinued  Conditioning with Bu/Flu followed by MUD 10/10 HLA matched, female, allo PBSCT ABO: A+/A+, CMV: neg/neg   Day 30 marrow (6/6) hypercellular marrow, no evidence of malignancy, chimerism 2% recipient    Today is BMT day +51    Heme  Reviewed CBC, HCT 27, Plts 134, ANC 3200 - no need for transfusions    ID  Hx Cholecystitis, pneumonia 05/2015  Immunocompromised  CMV PCR not required both negative    Viral prophylaxis with acyclovir  Fungal prophylaxis with fluconazole  PCP ppx with Bactrim (can cause hyperkalemia monitor potassium)    IgG level 6/1 755  May require IVIG in future, if <400 or clinical indication, will follow monthly     CV/Pulm  A-flutter- cont Cardizem SR 120 mg daily and Metoprolol 50 mg BID  Cancelled follow up with cardiology - advised to reschedule    Has oxygen at home, but has not needed it. Reports breathing feels good  Possible OSA, needs sleep study at some point soon    At Risk for GVHD  Full dose MTX given day 1,3,6. Day 11 MTX held due to elevated bilirubin and mucositis  Tacro goal 8-12  Tacro level 9.8  Tacro dose: 235mBID, no dose adjustment  No signs of GVHD on exam today    Renal  Creatinine: 1.35.  Increasing every visit.  Advised to hydrate adequately throughout the day.  She is no longer receiving IVF mag    Hypomagnesium  Mag: 1.4  Continue magnesium supplement to 4 tablets daily    Hyperkalemia  Potassium: 5.7, taking Florinef  daily    GI  Cont protonix; increase to twice daily   PRN Zofran and Compazine for nausea, occasional use  Weight loss, monitor    Mucositis  WHO Grade 1-2; now resolved  eprescribed dexamethasone s/s TID x7 - worked well, course finished  Encouraged good oral care, saline  rinses    Derm  Skin hyperpigmentation related to chemotherapy  Discussed with her this is not unexpected    Smoking Cessation  Denies smoking  Not using NRT currently, but has it available  Consider smoking cessation referral if needed    Follow up  BMT clinic: weekly  Bloodwork frequency: twice weekly    Vivianne Master, NP    BMT [name]  Referring [name]  Lives in [place]

## 2015-07-07 NOTE — Progress Notes (Signed)
Pt outer port of IVAD accessed. Positive blood return noted. Labs obtained and sent. Port flushed, heparinized and deaccessed. Pt discharged to clinic, next appointment set.

## 2015-07-07 NOTE — Telephone Encounter (Signed)
Pharmacist notifying us that the script is only for a 15 day supply. Per Vivianne Master NP: please dispense 120 tabs- 30 day supply.

## 2015-07-07 NOTE — Telephone Encounter (Signed)
I let Carly Higgins know that her Tacrolimus level is OK today but I need to reiterate the instructions from [place] today that she needs to push oral fluids as her kidney function got a bit worse since Monday. I explained the lab results, the effect of Tacrolimus on the kidneys and the importance of adequate hydration to keep creatinine normal and K at safe levels.  Carly Higgins stated understanding.

## 2015-07-07 NOTE — Progress Notes (Deleted)
[facility] Clinic Progress Note    CC/HPI  ***malignancy, ***BMT type, immunocompromised, at risk for GVHD***    PMH, Oncology History & Problem List  Reviewed    Interval History/Review of Systems        Other pertinent findings checked/documented below  []  fevers  []  palpitations []  urinary frequency   []  chills []  chest pain []  dysuria    []  dry eyes []  nausea/emesis  []  rash   []  ear pain []  abdominal cramping []  skin changes   []  sores in mouth []  abdominal discomfort []  pain   []  sore throat []  loose stool, non-formed []  fatigue   []  cough  []  diarrhea, watery []  enlarged lymph nodes   []  sputum production  []  weight loss []  bleeding   []  shortness of breath []  insufficient oral intake []  none of the above     Medications  Reviewed medications with patient today & electronically reconciled  Current Outpatient Prescriptions on File Prior to Visit   Medication Sig Dispense Refill    fludrocortisone (FLORINEF) 0.1 mg tablet Take 1 tablet (0.1 mg total) by mouth daily 4 tablet 2    sulfamethoxazole-trimethoprim (BACTRIM DS,SEPTRA DS) 800-160 MG per tablet Take 1 tablet by mouth three times a week 15 tablet 6    MAGNESIUM PO Take 2 tablets by mouth 2 times daily         albuterol-ipratropium (COMBIVENT RESPIMAT) 20-100 MCG/ACT inhaler Inhale 1 puff into the lungs 4 times daily 1 Inhaler 3    acyclovir (ZOVIRAX) 400 MG tablet Take 1 tablet (400 mg total) by mouth 2 times daily 120 tablet 5    diltiazem (CARDIZEM SR) 120 MG 12 hr capsule Take 2 capsules (240 mg total) by mouth 2 times daily 60 capsule 1    fluconazole (DIFLUCAN) 200 MG tablet Take 1 tablet (200 mg total) by mouth nightly 120 tablet 5    metoprolol (LOPRESSOR) 50 MG tablet Take 1 tablet (50 mg total) by mouth 2 times daily 60 tablet 1    nicotine (NICODERM CQ) 14 MG/24HR patch Place 1 patch onto the skin daily   Remove & discard patch after 24 hours. 21 patch 2    nicotine polacrilex (COMMIT) 2 MG lozenge No more  than 5 lozenges in 6 hours or 20 lozenges per 24 hours 108 lozenge 1    pantoprazole (PROTONIX) 40 MG EC tablet Take 1 tablet (40 mg total) by mouth every morning   Swallow whole. Do not crush, break, or chew. 60 tablet 5    ondansetron (ZOFRAN) 4 MG tablet Take 1 tablet (4 mg total) by mouth 3 times daily as needed (nausea) 90 tablet 0    tacrolimus (PROGRAF) 0.5 MG capsule Take 1 capsule (0.5 mg total) by mouth 2 times daily   And as directed by MD/NP 60 capsule 6    prochlorperazine (COMPAZINE) 10 MG tablet Take 1 tablet (10 mg total) by mouth 4 times daily as needed for Nausea 40 tablet 6    escitalopram (LEXAPRO) 10 MG tablet Take 15 mg by mouth daily       No current facility-administered medications on file prior to visit.        Recent labs  Reviewed, see below    VS, weight, KPS  There were no vitals taken for this visit.    Wt Readings from Last 3 Encounters:   07/01/15 109.6 kg (241 lb 9.6 oz)   07/01/15 109.6 kg (241 lb  9.6 oz)   06/23/15 109.6 kg (241 lb 11.2 oz)       KPS%: ***    Physical Exam   No acute distress, non-toxic appearing, cooperative with exam  Appropriate affect and dress with normal speech and motor activity  Normocephalic, scalp atraumatic  Conjunctiva & corneas clear, no purulent eye drainage  Lips, oral mucosa and tongue moist, without lesions; no postpharyngeal exudate  Neck is supple, normal skin turgor  Normal S1 & S2, regular, no murmur, rub, or gallop  Respirations unlabored, lung sounds clear to A&P bilateral auscultation  Bowel sounds are normoactive in four quadrants; abdomen is tympanic, soft, non-tender, and without masses or organomegaly  Radial pulses +2 and symmetric, CMS is intact  No lower extremity edema  Skin is without rashes or lesions    Review of EPIC data performed including Laboratory, Pathology, Radiology and clinical records was performed.     ***include GVHD eval here or at end of note, if needed   (smart phrases are dot_[id number] or  [id number])    Assessment and Plan  ***please address post-SCT major toxicities (mucositis, infection, GVHD, etc)  The patient came to clinic today for ***          Follow up  BMT clinic:      Bloodwork frequency:    Vivianne Master, NP    BMT***   Referring***  PCP***

## 2015-07-08 ENCOUNTER — Encounter: Payer: Self-pay | Admitting: Gastroenterology

## 2015-07-13 ENCOUNTER — Ambulatory Visit: Payer: Self-pay

## 2015-07-13 ENCOUNTER — Ambulatory Visit: Payer: Self-pay | Admitting: Oncology

## 2015-07-13 ENCOUNTER — Encounter: Payer: Self-pay | Admitting: Oncology

## 2015-07-13 VITALS — BP 109/54 | HR 88 | Temp 98.1°F | Resp 18 | Ht 65.75 in | Wt 241.3 lb

## 2015-07-13 DIAGNOSIS — Z9481 Bone marrow transplant status: Secondary | ICD-10-CM

## 2015-07-13 DIAGNOSIS — D471 Chronic myeloproliferative disease: Secondary | ICD-10-CM

## 2015-07-13 DIAGNOSIS — E875 Hyperkalemia: Secondary | ICD-10-CM

## 2015-07-13 DIAGNOSIS — R5383 Other fatigue: Secondary | ICD-10-CM

## 2015-07-13 DIAGNOSIS — R239 Unspecified skin changes: Secondary | ICD-10-CM

## 2015-07-13 DIAGNOSIS — D849 Immunodeficiency, unspecified: Secondary | ICD-10-CM

## 2015-07-13 LAB — CBC AND DIFFERENTIAL
Baso # K/uL: 0 10*3/uL (ref 0.0–0.1)
Basophil %: 0.5 %
Eos # K/uL: 0.2 10*3/uL (ref 0.0–0.4)
Eosinophil %: 4.7 %
Hematocrit: 27 % — ABNORMAL LOW (ref 34–45)
Hemoglobin: 8.8 g/dL — ABNORMAL LOW (ref 11.2–15.7)
IMM Granulocytes #: 0.1 10*3/uL (ref 0.0–0.1)
IMM Granulocytes: 3.1 %
Lymph # K/uL: 0.5 10*3/uL — ABNORMAL LOW (ref 1.2–3.7)
Lymphocyte %: 10.9 %
MCH: 28 pg/cell (ref 26–32)
MCHC: 32 g/dL (ref 32–36)
MCV: 87 fL (ref 79–95)
Mono # K/uL: 0.4 10*3/uL (ref 0.2–0.9)
Monocyte %: 10.2 %
Neut # K/uL: 3 10*3/uL (ref 1.6–6.1)
Nucl RBC # K/uL: 0 10*3/uL (ref 0.0–0.0)
Nucl RBC %: 0.2 /100 WBC (ref 0.0–0.2)
Platelets: 161 10*3/uL (ref 160–370)
RBC: 3.1 MIL/uL — ABNORMAL LOW (ref 3.9–5.2)
RDW: 18.9 % — ABNORMAL HIGH (ref 11.7–14.4)
Seg Neut %: 70.6 %
WBC: 4.2 10*3/uL (ref 4.0–10.0)

## 2015-07-13 LAB — COMPREHENSIVE METABOLIC PANEL, PL
ALT, PL: 48 U/L — ABNORMAL HIGH (ref 0–35)
AST, PL: 32 U/L (ref 0–35)
Albumin, PL: 4.3 g/dL (ref 3.5–5.2)
Alk Phos, PL: 110 U/L — ABNORMAL HIGH (ref 35–105)
Anion Gap,PL: 15 (ref 7–16)
Bilirubin Total, PL: 0.5 mg/dL (ref 0.0–1.2)
CO2,Plasma: 22 mmol/L (ref 20–28)
Calcium, PL: 10 mg/dL (ref 8.6–10.2)
Chloride,Plasma: 98 mmol/L (ref 96–108)
Creatinine: 1.55 mg/dL — ABNORMAL HIGH (ref 0.51–0.95)
GFR,Black: 43 * — AB
GFR,Caucasian: 37 * — AB
Glucose,Plasma: 173 mg/dL — ABNORMAL HIGH (ref 60–99)
Potassium,Plasma: 5.6 mmol/L — ABNORMAL HIGH (ref 3.4–4.7)
Sodium,Plasma: 135 mmol/L (ref 133–145)
Total Protein, PL: 7.2 g/dL (ref 6.3–7.7)
UN,Plasma: 31 mg/dL — ABNORMAL HIGH (ref 6–20)

## 2015-07-13 LAB — NEUTROPHIL #-INSTRUMENT: Neutrophil #-Instrument: 3 10*3/uL

## 2015-07-13 LAB — TACROLIMUS LEVEL: Tacrolimus: 10.8 ng/mL

## 2015-07-13 LAB — MAGNESIUM, PLASMA: Magnesium, PL: 1.4 mEq/L (ref 1.3–2.1)

## 2015-07-13 LAB — IGG: IgG: 780 mg/dL (ref 700–1600)

## 2015-07-13 LAB — LD, PL: LD, PL: 165 U/L (ref 118–225)

## 2015-07-13 NOTE — Progress Notes (Signed)
Pt tolerated port draw well, positive blood return obtained and blood work sent for labs. Port heparinized and de-accessed. Discharged to clinic in stable condition.

## 2015-07-13 NOTE — Progress Notes (Signed)
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    PMH, Oncology History & Problem List  Reviewed    Interval History/Review of Systems  Nausea problematic (general queasy feeling)  Feels shaky  Fatigues easily    Other pertinent findings checked/documented below  []  fevers  []  palpitations []  urinary frequency   []  chills []  chest pain []  dysuria    []  dry eyes [x]  nausea/emesis  []  rash   []  ear pain []  abdominal cramping []  skin changes   []  sores in mouth []  abdominal discomfort []  pain   []  sore throat []  loose stool, non-formed [x]  fatigue   []  cough  []  diarrhea, watery []  enlarged lymph nodes   []  sputum production  []  weight loss []  bleeding   []  shortness of breath []  insufficient oral intake []  none of the above     Medications  Reviewed medications with patient today & electronically reconciled  Current Outpatient Prescriptions on File Prior to Visit   Medication Sig Dispense Refill    pantoprazole (PROTONIX) 40 MG EC tablet Take 1 tablet (40 mg total) by mouth 2 times daily   Swallow whole. Do not crush, break, or chew. 60 tablet 5    diltiazem (CARDIZEM SR) 120 MG 12 hr capsule Take 2 capsules (240 mg total) by mouth 2 times daily 60 capsule 1    fludrocortisone (FLORINEF) 0.1 mg tablet Take 1 tablet (0.1 mg total) by mouth daily 4 tablet 2    sulfamethoxazole-trimethoprim (BACTRIM DS,SEPTRA DS) 800-160 MG per tablet Take 1 tablet by mouth three times a week 15 tablet 6    MAGNESIUM PO Take 2 tablets by mouth 2 times daily         acyclovir (ZOVIRAX) 400 MG tablet Take 1 tablet (400 mg total) by mouth 2 times daily 120 tablet 5    fluconazole (DIFLUCAN) 200 MG tablet Take 1 tablet (200 mg total) by mouth nightly 120 tablet 5    metoprolol (LOPRESSOR) 50 MG tablet Take 1 tablet (50 mg total) by mouth 2 times daily 60 tablet 1    ondansetron (ZOFRAN) 4 MG tablet Take 1 tablet (4 mg total) by mouth 3  times daily as needed (nausea) 90 tablet 0    tacrolimus (PROGRAF) 0.5 MG capsule Take 1 capsule (0.5 mg total) by mouth 2 times daily   And as directed by MD/NP 60 capsule 6    escitalopram (LEXAPRO) 10 MG tablet Take 15 mg by mouth daily      albuterol-ipratropium (COMBIVENT RESPIMAT) 20-100 MCG/ACT inhaler Inhale 1 puff into the lungs 4 times daily 1 Inhaler 3    nicotine polacrilex (COMMIT) 2 MG lozenge No more than 5 lozenges in 6 hours or 20 lozenges per 24 hours 108 lozenge 1    prochlorperazine (COMPAZINE) 10 MG tablet Take 1 tablet (10 mg total) by mouth 4 times daily as needed for Nausea 40 tablet 6     No current facility-administered medications on file prior to visit.        Recent labs  Reviewed, see below    VS, weight, KPS  Visit Vitals    BP 109/54    Pulse 88    Temp 36.7 C (98.1 F)    Resp 18    Ht 167 cm (5' 5.75")    Wt 109.5 kg (241 lb 4.8 oz)    SpO2 100%  BMI 39.25 kg/m2       Wt Readings from Last 3 Encounters:   07/13/15 109.5 kg (241 lb 4.8 oz)   07/13/15 109.5 kg (241 lb 4.8 oz)   07/07/15 110.4 kg (243 lb 4.8 oz)       KPS%: 90    Physical Exam     General Appearance:  Mental status  No acute distress, non-toxic appearing  Normal affect, speech, dress, motor activity   Head:  Normocephalic, atraumatic   Eyes:  Conjunctiva/corneas clear, no eye drainage   Oral/Throat: Lips, mucosa, and tongue moist,   no postpharyngeal exudate   Neck: Supple, normal turgor   Heart: S1, S2 normal, regular rhythm, no murmur, rub or gallop   Lungs:   Respirations unlabored, clear to A&P auscultation bilaterally   Abdomen:   Soft, non-tender, bowel sounds active all four quadrants,   tympanic, no masses, no organomegaly   Extremities: No edema   Pulses: 2+ and symmetric   Skin: No rashes or lesions     Review of EPIC data performed including Laboratory, Pathology, Radiology and clinical records was performed.     Assessment and Plan  Carly Higgins is a 55 yo female with unclassifiable  myeloproliferative neoplasm s/p MUD allo PBSCT on 05/17/15. The patient came to clinic today for routine follow up.    Atypical MPN S/P URD, day +57  Completed Decitabine 19m/m2 IV daily x 5 days - Cycle # 1 12/15/14  Was on intermittent/dose adjusted hydroxyurea prior to transplant, discontinued prior to BMT  Conditioning with Bu/Flu followed by MUD 10/10 HLA matched, female, allo PBSCT ABO: A+/A+, CMV: neg/neg   Day 30 marrow (6/6) hypercellular marrow, no evidence of malignancy, chimerism 2% recipient  Next marrow due ~day +100 (along with PFTs & Echo); not scheduled yet    Heme  Engrafted  Transfusion independent    ID  Hx Cholecystitis, pneumonia 05/2015  Immunocompromised  CMV PCR not required both negative    Continue acyclovir and fluconazole ppx  PCP ppx with Bactrim (can cause hyperkalemia monitor potassium)    IgG level 7/5 780  May require IVIG in future, if <400 or clinical indication, will follow monthly     CV/Pulm  A-flutter- cont Cardizem SR 120 mg daily and Metoprolol 50 mg BID  Patient cancelled recent follow up with cardiology-rescheduled for 7/11  Has oxygen at home, but has not needed it  Possible OSA, needs sleep study at some point soon    At Risk for GVHD  Full dose MTX given day 1,3,6. Day 11 mtx held due to elevated bilirubin and mucositis  Tacro goal 8-12  Tacro dose: 234mBID  No signs of GVHD on exam today    Renal  Creatinine 1.55; monitor  Has been slightly increased each visit  Drinking well    Hypomagnesium  Oral magnesium supplement: 4 tablets daily    Hyperkalemia  Taking Florinef daily  Serum K stable    GI  Cont protonix twice daily   Recommeded taking Zofran TID and compazine prn (patient has these medications)  Weight loss, monitor    Derm  Skin hyperpigmentation related to chemotherapy  Skin in axilla peeling a bit  Discussed that this is not unexpected and will resolve    Fatigue  Not unexpected at this point after BMT, monitor    Follow up  BMT clinic:  weekly  Bloodwork frequency: twice weekly    BMT Dr. LiLytle ButteReferring Dr. CaLenn CalLives  in [place], NP

## 2015-07-14 ENCOUNTER — Telehealth: Payer: Self-pay | Admitting: Hematology

## 2015-07-14 NOTE — Telephone Encounter (Signed)
Per [name]: Hold off on Florinef for now. K is elevated but stable. We will continue to monitor and may need in the future.  Carly Higgins stated understanding. She is avoiding Potassium rich food and drinks. She will continue to drink plenty of fluids daily.

## 2015-07-15 ENCOUNTER — Telehealth: Payer: Self-pay | Admitting: Hematology

## 2015-07-15 ENCOUNTER — Other Ambulatory Visit: Payer: Self-pay | Admitting: Hematology

## 2015-07-15 LAB — COMPREHENSIVE METABOLIC PANEL
ALT: 38 U/L — ABNORMAL HIGH (ref 0–35)
AST: 29 U/L (ref 0–35)
Albumin: 4.3 g/dL (ref 3.5–5.2)
Alk Phos: 120 U/L — ABNORMAL HIGH (ref 35–105)
Anion Gap: 14 (ref 7–16)
Bilirubin,Total: 0.5 mg/dL (ref 0.0–1.2)
CO2: 22 mmol/L (ref 20–28)
Calcium: 10.1 mg/dL (ref 8.6–10.2)
Chloride: 97 mmol/L (ref 96–108)
Creatinine: 2.04 mg/dL — ABNORMAL HIGH (ref 0.51–0.95)
GFR,Black: 31 mL/min/{1.73_m2}
GFR,Caucasian: 25 mL/min/{1.73_m2}
Glucose: 109 mg/dL — ABNORMAL HIGH (ref 60–99)
Lab: 33 mg/dL — ABNORMAL HIGH (ref 6–20)
Potassium: 5.9 mmol/L — ABNORMAL HIGH (ref 3.4–4.7)
Sodium: 133 mmol/L (ref 133–145)
Total Protein: 7.7 g/dL (ref 6.3–7.7)

## 2015-07-15 LAB — CBC AND DIFFERENTIAL
Baso # K/uL: 0 10*3/uL (ref 0.0–0.1)
Basophil %: 0.5 % (ref 0.1–1.2)
Eos # K/uL: 0.2 10*3/uL (ref 0.0–0.4)
Eosinophil %: 3.7 % (ref 0.7–5.8)
Hematocrit: 26.5 % — ABNORMAL LOW (ref 34.1–44.9)
Hemoglobin: 8.9 g/dL — ABNORMAL LOW (ref 11.2–15.7)
Immature Granulocytes Absolute: 0.13 10*3/uL (ref 0.0–0.2)
Immature Granulocytes: 3 % — ABNORMAL HIGH (ref 0.0–2.0)
Lymph # K/uL: 0.5 10*3/uL — ABNORMAL LOW (ref 1.2–3.7)
Lymphocyte %: 11.2 % — ABNORMAL LOW (ref 19.3–51.7)
MCH: 29.2 pg (ref 25.6–32.2)
MCHC: 33.6 g/dL (ref 32.2–35.5)
MCV: 86.9 fL (ref 79.4–94.8)
Mono # K/uL: 0.5 10*3/uL (ref 0.2–0.9)
Monocyte %: 11.9 % (ref 4.7–12.5)
Neut # K/uL: 3.1 10*3/uL (ref 1.6–6.0)
Nucl RBC # K/uL: 0 /100 WBC (ref 0.0–0.2)
Platelets: 178 10*3/uL — ABNORMAL LOW (ref 182–369)
RBC Distribution Width-SD: 58.8 fL — ABNORMAL HIGH (ref 36.4–46.3)
RBC: 3.05 10*6/uL — ABNORMAL LOW (ref 3.93–5.22)
RDW: 18.6 % — ABNORMAL HIGH (ref 11.7–14.4)
Seg Neut %: 69.7 % (ref 34.0–71.1)
WBC: 4.4 10*3/uL (ref 4.0–10.0)

## 2015-07-15 LAB — MAGNESIUM: Magnesium: 2 mg/dL (ref 1.56–2.52)

## 2015-07-15 LAB — LACTATE DEHYDROGENASE: LD: 171 mmol/L (ref 118–225)

## 2015-07-15 NOTE — Telephone Encounter (Signed)
Home RN had called and will be there at 43.

## 2015-07-16 LAB — TACROLIMUS LEVEL: Tacrolimus: 9.5 ng/mL

## 2015-07-16 NOTE — Patient Instructions (Signed)
July 2017   Sunday Monday Tuesday Wednesday Thursday Friday Saturday                                 1       2     3     4     5 6     7     8       9     10     11     FOLLOW UP   11:15 AM  Cardiology at [facility] 12     13     INJECTION   11:15 AM     FOLLOW UP    11:30 AM 14     15       16     17     18     INJECTION   10:00 AM     FOLLOW UP    10:30 AM 19     20     21     22       23     24     25     26     27     28     INJECTION   11:00 AM     FOLLOW UP  11:30 AM 29       30     31 

## 2015-07-16 NOTE — Patient Instructions (Signed)
July 2017   Sunday Monday Tuesday Wednesday Thursday Friday Saturday                                 1       2     3     4     5   6     7     8       9     10     11   12     13     INJECTION   11:15 AM       FOLLOW UP   11:30 AM 14     15       16     17     18     INJECTION   10:00 AM     FOLLOW UP  10:30 AM 19     20     21     22       23     24     25     26     27     28     INJECTION   11:00 AM     FOLLOW UP    11:30 AM 29       30     08 September 2015   Sunday Monday Tuesday Wednesday Thursday Friday Saturday             1     2     3     INJECTION   10:30 AM 4     5       6     7     8     9     10     11     INJECTION   10:00 AM     FOLLOW UP    10:30 AM 12       13     14     15     16     17     18     INJECTION   10:00 AM     FOLLOW UP   10:30 AM 19       20     21     22     23     INJECTION    9:30 AM     FOLLOW UP    10:00 AM 24     25     26       27     28     29     30     31      INJECTION   10:30 AM     FOLLOW UP    11:00 AM

## 2015-07-18 ENCOUNTER — Encounter: Payer: Self-pay | Admitting: Cardiology

## 2015-07-18 NOTE — Progress Notes (Deleted)
Cardiology Office Revisit Note    Date of Visit: 07/18/2015 Patient: Carly Higgins   PCP: Benedict Needy, MD  Cardiologist: None established Patient DOB: [birth year]   CC: Follow up: AF/flutter     Subjective/Reason For Visit     I had the pleasure of seeing Carly Higgins in cardiology follow up on 07/18/2015.     HPI:  Carly Higgins a 55 y.o. 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  The patient experienced expected side effects of treatment including nausea/vomiting, diarrhea, and mucositis requiring a PCA. Patient had increased abdominal pain/bloating, nausea/vomiting, and total bilirubin elevation with jaundice on 5/19. [place] with Doppler revealed no reversal of flow but did show gallbladder sludge with distention and wall thickening. She was started on broad-spectrum antibiotics. On 5/20 she suddenly became tachycardic to the 160s, not responsive to fluid boluses or increased IV fluids. Cultures had no growth to date, and CXR at that time showed pulmonary edema. She was diuresed without effect on her heart rate, and metoprolol was started and then increased without effect. On 5/21 she became hypoxic, fluids were stopped and she was further diuresed. Echo with no abnormalities, CTA was negative for PE and showed likely pneumonia. Cardiology was consulted and patient was started on diltiazem drip for a-flutter with subsequent rate control and normal sinus rhythm on telemetry. She continued with severe abdominal pain/distention, and CT A/P revealed ascites and gallbladder sludge with distention/wall thickening. Her total bilirubin  trended down and normalized. Diuresis was continued  with good urine output and improved shortness of breath. diltiazem drip was converted to oral with maintained rate control for about 3 days, but unfortunately she became tachycardic to the 150s-160s again  overnight 5/26. IV metoprolol was given several times at wide intervals with no lasting effect. Cardiology was re-consulted and increased her oral diltiazem dose with good effect. She had an episode of rate-controlled a-flutter on 5/29 with a short run of asymptomatic VT; Cardiology was made aware and gave no change in recommendations. CT chest was obtained and revealed likely pulmonary edema as well as new RLL opacities. She was discharged on Diltiazem 12 hr capsules 219m PO every 12 hours,  Metoprolol 553mPO every 12 hours,Tacrolimus 2.34m56mID, Oxygen 2L via NC-  titrate between 1-6 LPM,  albuterol/Iipatropium inhaler 1 puff four times a day and nicotine 52m23mhr patch, lozenges .   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 Do not crush, break, or chew.    diltiazem (CARDIZEM SR) 120 MG 12 hr capsule Take 2 capsules (240 mg total)  by mouth 2 times daily    fludrocortisone (FLORINEF) 0.1 mg tablet Take 1 tablet (0.1 mg total) by mouth daily    sulfamethoxazole-trimethoprim (BACTRIM DS,SEPTRA DS) 800-160 MG per tablet Take 1 tablet by mouth three times a week    MAGNESIUM PO Take 2 tablets by mouth 2 times daily       albuterol-ipratropium (COMBIVENT RESPIMAT) 20-100 MCG/ACT inhaler Inhale 1 puff into the lungs 4 times daily    acyclovir (ZOVIRAX) 400 MG tablet Take 1 tablet (400 mg  total) by mouth 2 times daily    fluconazole (DIFLUCAN) 200 MG tablet Take 1 tablet (200 mg total) by mouth nightly    metoprolol (LOPRESSOR) 50 MG tablet Take 1 tablet (50 mg total) by mouth 2 times daily    nicotine polacrilex (COMMIT) 2 MG lozenge No more than 5 lozenges in 6 hours or 20 lozenges per 24 hours    ondansetron (ZOFRAN) 4 MG tablet Take 1 tablet (4 mg total) by mouth 3 times daily as needed (nausea)    tacrolimus (PROGRAF) 0.5 MG capsule Take 1 capsule (0.5 mg total) by mouth 2 times daily   And as directed by MD/NP    prochlorperazine (COMPAZINE) 10 MG tablet Take 1 tablet (10 mg total) by mouth 4 times daily as needed for Nausea    escitalopram (LEXAPRO) 10 MG tablet Take 15 mg by mouth daily     No Known Allergies (drug, envir, food or latex)  Vitals and Physical Exam     Dachelle's  vitals were not taken for this visit. There is no height or weight on file to calculate BMI.    Physical Exam  Laboratory Data     Hematology:   Results in Past 365 Days  Result Component Current Result Previous Result   WBC 4.4 (07/15/2015) 4.2 (07/13/2015)   Hemoglobin 8.9 (L) (07/15/2015) 8.8 (L) (07/13/2015)   Hematocrit 26.5 (L) (07/15/2015) 27 (L) (07/13/2015)   Platelets 178 (L) (07/15/2015) 161 (07/13/2015)     Chemistry:   Results in Past 365 Days  Result Component Current Result Previous Result   Sodium 133 (07/15/2015) 136 (07/05/2015)   Potassium 5.9 (H) (07/15/2015) 5.6 (H) (07/05/2015)   Creatinine 2.04 (H) (07/15/2015) 1.55 (H) (07/13/2015)   Glucose 109 (H) (07/15/2015) 163 (H) (07/05/2015)   Calcium 10.1 (07/15/2015) 9.7 (07/05/2015)   AST 29 (07/15/2015) 29 (07/05/2015)   ALT 38 (H) (07/15/2015) 44 (H) (07/05/2015)   TSH 3.63 (12/31/2014) Not in Time Range     Coagulation Studies:   Results in Past 365 Days  Result Component Current Result Previous Result   aPTT 26.5 (05/11/2015) 23.8 (L) (05/03/2015)   INR 1.3 (H) (05/27/2015) 1.1 (05/11/2015)     Cardiac:   No results found for requested labs within last 365 days.     Lipids:   Results  in Past 365 Days  Result Component Current Result Previous Result   Triglycerides 156 (!) (05/26/2015) 347 (!) (05/11/2015)     Cardiac/Imaging Data     ECG:       Echo Complete 05/29/2015    Narrative  Tachycardia NOS.   Mildly hypertrophied LV with left atrial enlargement.    Normal calculated resting LVEF (60%) with reduced stroke volume but no   significant wall motion abnormalities.    No significant valvular abnormalities.    Normal right heart size and function with estimated normal pulmonary   artery pressures.  Impression and Plan     Patient Active Problem List   Diagnosis Code    MPN (myeloproliferative neoplasm) D47.1    Myeloproliferative disease D47.1    post allogenic MUD (F)  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 Z94.81    Immunocompromised D84.9    Hypomagnesemia E83.42    Impaired mobility and ADLs Z74.09    Hypoxia R09.02    On home oxygen therapy Z99.81    Tongue coating and burning sensation with eating  K14.3    Skin changes related to chemotherapy R23.4, T45.1X5A    Hyperkalemia E87.5       This is an 55 y.o. female with     1. A-Fib/flutter:   - cont Cardizem SR 120 mg daily and Metoprolol 50 mg BID  - Eliquis 44m BID    2. Follow up in clinic in 3 months         ATeddy Spike NP, Cardiology   Electronically signed on 07/18/2015 at 9:07 PM.

## 2015-07-19 ENCOUNTER — Other Ambulatory Visit: Payer: Self-pay | Admitting: Hematology

## 2015-07-19 ENCOUNTER — Telehealth: Payer: Self-pay

## 2015-07-19 ENCOUNTER — Ambulatory Visit: Payer: Self-pay | Admitting: Cardiology

## 2015-07-19 ENCOUNTER — Other Ambulatory Visit: Payer: Self-pay | Admitting: Gastroenterology

## 2015-07-19 VITALS — BP 117/64 | HR 74 | Ht 66.0 in | Wt 241.0 lb

## 2015-07-19 DIAGNOSIS — Z9481 Bone marrow transplant status: Secondary | ICD-10-CM

## 2015-07-19 DIAGNOSIS — R0602 Shortness of breath: Secondary | ICD-10-CM

## 2015-07-19 DIAGNOSIS — I4891 Unspecified atrial fibrillation: Secondary | ICD-10-CM

## 2015-07-19 DIAGNOSIS — E875 Hyperkalemia: Secondary | ICD-10-CM

## 2015-07-19 DIAGNOSIS — T50905A Adverse effect of unspecified drugs, medicaments and biological substances, initial encounter: Secondary | ICD-10-CM

## 2015-07-19 LAB — CBC AND DIFFERENTIAL
Baso # K/uL: 0 10*3/uL (ref 0.0–0.1)
Basophil %: 0.6 % (ref 0.1–1.2)
Eos # K/uL: 0.3 10*3/uL (ref 0.0–0.4)
Eosinophil %: 5.1 % (ref 0.7–5.8)
Hematocrit: 26.6 % — ABNORMAL LOW (ref 34.1–44.9)
Hemoglobin: 8.9 g/dL — ABNORMAL LOW (ref 11.2–15.7)
Immature Granulocytes Absolute: 0.23 10*3/uL — ABNORMAL HIGH (ref 0.0–0.2)
Immature Granulocytes: 4.6 % — ABNORMAL HIGH (ref 0.0–2.0)
Lymph # K/uL: 0.5 10*3/uL — ABNORMAL LOW (ref 1.2–3.7)
Lymphocyte %: 9.9 % — ABNORMAL LOW (ref 19.3–51.7)
MCH: 28.9 pg (ref 25.6–32.2)
MCHC: 33.5 g/dL (ref 32.2–35.5)
MCV: 86.4 fL (ref 79.4–94.8)
Mono # K/uL: 0.5 10*3/uL (ref 0.2–0.9)
Monocyte %: 10.9 % (ref 4.7–12.5)
Neut # K/uL: 3.4 10*3/uL (ref 1.6–6.0)
Nucl RBC # K/uL: 0 /100 WBC (ref 0.0–0.2)
Platelets: 179 10*3/uL — ABNORMAL LOW (ref 182–369)
RBC Distribution Width-SD: 58.7 fL — ABNORMAL HIGH (ref 36.4–46.3)
RBC: 3.08 10*6/uL — ABNORMAL LOW (ref 3.93–5.22)
RDW: 18.6 % — ABNORMAL HIGH (ref 11.7–14.4)
Seg Neut %: 68.9 % (ref 34.0–71.1)
WBC: 5 10*3/uL (ref 4.0–10.0)

## 2015-07-19 LAB — COMPREHENSIVE METABOLIC PANEL
ALT: 32 U/L (ref 0–35)
AST: 26 U/L (ref 0–35)
Albumin: 4.4 mg/dL (ref 3.5–5.2)
Alk Phos: 117 U/L — ABNORMAL HIGH (ref 35–105)
Anion Gap: 15 (ref 7–16)
Bilirubin,Total: 0.4 mg/dL (ref 0.0–1.2)
CO2: 21 mmol/L (ref 20–28)
Calcium: 10 mg/dL (ref 8.6–10.2)
Chloride: 98 mmol/L (ref 96–108)
Creatinine: 2.06 mg/dL — ABNORMAL HIGH (ref 0.51–0.95)
GFR,Black: 30 mL/min/{1.73_m2}
GFR,Caucasian: 25 mL/min/{1.73_m2}
Glucose: 163 mg/dL — ABNORMAL HIGH (ref 60–99)
Lab: 30 mg/dL — ABNORMAL HIGH (ref 6–20)
Potassium: 6 mmol/L (ref 3.4–4.7)
Sodium: 134 mmol/L (ref 133–145)
Total Protein: 7.6 g/dL (ref 6.3–7.7)

## 2015-07-19 LAB — MAGNESIUM: Magnesium: 1.9 mg/dL (ref 1.56–2.52)

## 2015-07-19 LAB — LACTATE DEHYDROGENASE: LD: 168 mmol/L (ref 118–225)

## 2015-07-19 MED ORDER — FLUDROCORTISONE ACETATE 100 MCG PO TABS *I*
0.1000 mg | ORAL_TABLET | Freq: Every day | ORAL | 2 refills | Status: DC
Start: 2015-07-19 — End: 2015-09-29

## 2015-07-19 MED ORDER — APIXABAN 5 MG PO TABS *I*
5.0000 mg | ORAL_TABLET | Freq: Two times a day (BID) | ORAL | 11 refills | Status: DC
Start: 2015-07-19 — End: 2015-10-05

## 2015-07-19 NOTE — Telephone Encounter (Signed)
K is 6 today and Creatinine is 2.06. Per Irene Pap FNP:  Push fluids- mainly water. Start Florinef 0.1 mg daily. Reduce Tacrolimus from 2 mg twice daily to 1.5 mg twice daily.   I spoke to Ashtin and gave her these instructions. Script sent for Florinef to her [facility], as requested. She stated understanding.  We will see her in clinic the day after tomorrow and labs will be repeated.

## 2015-07-19 NOTE — Patient Instructions (Addendum)
1. Continue current medications     2. Start Eliquis (apixaban) 5mg  twice daily to prevent blood clots from forming    3. Follow up with Dr. Orvil Feil in 1 month    4. See attached for information on Eliquis. If you note any unusual bleeding in your urine, stool, sputum or unusual nose bleeding please call [phone].    5. Weigh yourself once a week. If you note a 5 pound weight gain in one week or increasing shortness of breath please notify the clinic. You my need your medications adjusted. [id number]            Atrial Fibrillation    The Basics   Written by the doctors and editors at UpToDate   What is atrial fibrillation?--Atrial fibrillation is the most common heart rhythm problem people have (figure 1). The condition puts you at risk of stroke, heart attack, and other problems. Another term for atrial fibrillation is A-fib.   In people with A-fib, the electrical signals that control the heartbeat become abnormal. As a result, the top 2 chambers of the heart stop pumping effectively, and a small amount of the blood that should move out of these chambers gets left behind. As the blood pools, it can start to form clots. These clots can travel to the brain through the blood vessels, and cause strokes.   In some people, A-fib never goes away. In others, A-fib can come and go. If you had one or more bouts of A-fib, but have a normal heart rhythm now, ask your doctor what you can do to keep A-fib from coming back. Some people can reduce their chances of having A-fib again by:  ?Controlling their blood pressure  ?Not drinking a lot of alcohol in one sitting (limit to 1 to 2 drinks in one day)  ?Cutting down on caffeine   ?Getting treatment for an overactive thyroid gland  ?Getting regular exercise  What are the symptoms of atrial fibrillation?--Some people with A-fib have no symptoms. When symptoms do occur, they can include:   ?Feeling as though your heart is racing, skipping beats, or beating out of sync   ?Mild  chest tightness or pain  ?Feeling lightheaded, dizzy, or like you might pass out  ?Having trouble breathing, especially with exercise   Is there a test for atrial fibrillation?--Yes. If your doctor or nurse suspects you have A-fib, he or she will probably do a test called an electrocardiogram. This test, also known as an ECG or EKG, measures the electrical activity in your heart.  How is atrial fibrillation treated?--Treatment can include one or more of these:  ?Medicines to control the speed or rhythm of the heartbeat   ?Medicines to keep clots from forming  ?A treatment called cardioversion that involves applying a mild electrical current to the heart to fix its rhythm  ?Treatments called ablation, which use heat (radiofrequency ablation) or cold (cryoablation) to destroy the small part of the heart that is sending abnormal electrical signals  ?A device called a pacemaker that is implanted in your body and sends electrical signals to the heart to control the heartbeat   ?Surgery to create scar tissue in the heart to block the flow of electrical signals   What will my life be like?--Many people with A-fib are able to live normal lives. Still, it is important that you take the medicines your doctor prescribes every day. Taking your medicines as directed can help reduce the chances that your A-fib will cause  a stroke. Its also a good idea to learn what the signs and symptoms of a stroke are (figure 2).  All topics are updated as new evidence becomes available and our peer review process is complete.  This topic retrieved from UpToDate on: Apr 29, 2013.  Topic 15327 Version 9.0  Release: 23.3 - C23.74  2015UpToDate, Inc.All rights reserved.  figure 1: Atrial fibrillation     This drawing shows where the heart is located in the chest. In atrial fibrillation, the electrical signals that control the heartbeat become abnormal. As a result, the top two chambers of the heart (shown in purple) stop  pumping effectively, and blood that should move out of these chambers gets left behind.  Graphic [phone] Version 2.0  figure 2: Signs of stroke     The letters in the word "fast" help you remember the signs of stroke. If a person shows any of these signs, call an ambulance right away. In the [place] and [place], [place] 9-1-1.   Graphic [phone] Version 3.0  Consumer Information Use and Disclaimer   This information is not specific medical advice and does not replace information you receive from your health care provider. This is only a brief summary of general information. It does NOT include all information about conditions, illnesses, injuries, tests, procedures, treatments, therapies, discharge instructions or life-style choices that may apply to you. You must talk with your health care provider for complete information about your health and treatment options. This information should not be used to decide whether or not to accept your health care provider's advice, instructions or recommendations. Only your health care provider has the knowledge and training to provide advice that is right for you.The use of UpToDate content is governed by the UpToDate Terms of Use. 2015 [employer], [employer] rights reserved.  Copyright   2015UpToDate, Inc.All rights reserved.        Apixaban (a PIX a ban)    Brand Names: [place] Eliquis   Brand Names: [place] Eliquis   Warning    Do not stop taking this drug without talking to your doctor. Stopping this drug when you are not supposed to may raise the chance of blood clots. This includes stroke in certain people. You may need to stop this drug before certain types of dental or health care. Your doctor will tell you when to start taking it again. Follow what your doctor tells you closely.   People who have any type of spinal or epidural procedure are more likely to have bleeding problems around the spine when already on this drug. This bleeding rarely happens, but can lead to not being able to  move body (paralysis) long-term or paralysis that will not go away. The risk is raised in people who have problems with their spine, a certain type of epidural catheter, or have had spinal surgery. The risk is also raised in people who take any other drugs that may affect how the blood clots like blood-thinner drugs (like warfarin), aspirin, or nonsteroidal anti-inflammatory drugs (NSAIDs). Talk with the doctor.   Tell your doctor you use this drug before you have a spinal or epidural procedure. Call your doctor right away if you have any signs of nerve problems like back pain, numbness or tingling, muscle weakness, paralysis, or loss of bladder or bowel control.   Talk with your doctor if you have recently had or will be having a spinal or epidural procedure. Some time may need to pass between the use of  this drug and your procedure. Talk with your doctor.  What is this drug used for?    It is used to thin the blood so that clots will not form.  What do I need to tell my doctor BEFORE I take this drug?    If you have an allergy to apixaban or any other part of this drug.   If you are allergic to any drugs like this one, any other drugs, foods, or other substances. Tell your doctor about the allergy and what signs you had, like rash; hives; itching; shortness of breath; wheezing; cough; swelling of face, lips, tongue, or throat; or any other signs.   If you have any of these health problems: Active bleeding or liver problems.   If you have had a heart valve replaced.   If you are taking any of these drugs: Carbamazepine, phenytoin, rifampin, or St. John's wort.   This is not a list of all drugs or health problems that interact with this drug.   Tell your doctor and pharmacist about all of your drugs (prescription or OTC, natural products, vitamins) and health problems. You must check to make sure that it is safe for you to take this drug with all of your drugs and health problems. Do not start, stop, or  change the dose of any drug without checking with your doctor.  What are some things I need to know or do while I take this drug?    Tell dentists, surgeons, and other doctors that you use this drug.   Check blood work regularly.   Do not run out of this drug.   You may bleed more easily. Be careful and avoid injury. Use a soft toothbrush and an Copy.   Very bad and sometimes deadly bleeding problems have happened with this drug. Talk with the doctor.   If you are 86 or older, use this drug with care. You could have more side effects.   Tell your doctor if you are pregnant or plan on getting pregnant. You will need to talk about the benefits and risks of using this drug while you are pregnant.   Tell your doctor if you are breast-feeding. You will need to talk about any risks to your baby.  What are some side effects that I need to call my doctor about right away?    WARNING/CAUTION: Even though it may be rare, some people may have very bad and sometimes deadly side effects when taking a drug. Tell your doctor or get medical help right away if you have any of the following signs or symptoms that may be related to a very bad side effect:   Signs of an allergic reaction, like rash; hives; itching; red, swollen, blistered, or peeling skin with or without fever; wheezing; tightness in the chest or throat; trouble breathing or talking; unusual hoarseness; or swelling of the mouth, face, lips, tongue, or throat.   Signs of bleeding like throwing up blood or throw up that looks like coffee grounds; coughing up blood; blood in the urine; black, red, or tarry stools; bleeding from the gums; vaginal bleeding that is not normal; bruises without a reason or that get bigger; or any bleeding that is very bad or that you cannot stop.   Very bad dizziness or passing out.   A fall or crash when you hit your head. Talk with your doctor even if you feel fine.   Change in thinking clearly and with logic.  Very  bad headache.   Very bad joint pain or swelling.  What are some other side effects of this drug?    All drugs may cause side effects. However, many people have no side effects or only have minor side effects. Call your doctor or get medical help if you have any side effects that bother you or do not go away.   These are not all of the side effects that may occur. If you have questions about side effects, call your doctor. Call your doctor for medical advice about side effects.   You may report side effects to your national health agency.  How is this drug best taken?    Use this drug as ordered by your doctor. Read all information given to you. Follow all instructions closely.   Take as you have been told, even if you feel well.   To gain the most benefit, do not miss doses.   Take with or without food.  What do I do if I miss a dose?    Take a missed dose as soon as you think about it on the same day you missed the dose.   Do not take 2 doses at the same time or extra doses.  How do I store and/or throw out this drug?    Store at room temperature.   Store in a dry place. Do not store in a bathroom.   Keep all drugs out of the reach of children and pets.   Check with your pharmacist about how to throw out unused drugs.  General drug facts    If your symptoms or health problems do not get better or if they become worse, call your doctor.   Do not share your drugs with others and do not take anyone else's drugs.   Keep a list of all your drugs (prescription, natural products, vitamins, OTC) with you. Give this list to your doctor.   Talk with the doctor before starting any new drug, including prescription or OTC, natural products, or vitamins.   Some drugs may have another patient information leaflet. If you have any questions about this drug, please talk with your doctor, pharmacist, or other health care provider.   If you think there has been an overdose, call your poison control center or get  medical care right away. Be ready to tell or show what was taken, how much, and when it happened.  Consumer Information Use and Disclaimer    This information should not be used to decide whether or not to take this medicine or any other medicine. Only the healthcare provider has the knowledge and training to decide which medicines are right for a specific patient. This information does not endorse any medicine as safe, effective, or approved for treating any patient or health condition. This is only a brief summary of general information about this medicine. It does NOT include all information about the possible uses, directions, warnings, precautions, interactions, adverse effects, or risks that may apply to this medicine. This information is not specific medical advice and does not replace information you receive from the healthcare provider. You must talk with the healthcare provider for complete information about the risks and benefits of using this medicine.  Last Reviewed Date   2012-03-26  Copyright     2015 [employer] its affiliates and/or licensors. All rights reserved.

## 2015-07-19 NOTE — Progress Notes (Signed)
Cardiology Office Revisit Note    Date of Visit: 07/19/2015 Patient: [name]   PCP: Benedict Needy, MD  Cardiologist: None established Patient DOB: Jul 22, 1960   CC: Follow up: AF/flutter     Subjective/Reason For Visit     I had the pleasure of seeing KEITH CANCIO in cardiology follow up on 07/19/2015.     HPI:  ANDRIEA HASEGAWA a 55 y.o. female with a history of myeloproliferative disorder  who presents for follow up after admission on 05/17/2015 to undergo busulfan/fludarabine conditioning followed by a MUD allogeneic PBSCT. On 5/20 she suddenly became tachycardic to the 160s, not responsive to fluid boluses or increased IV fluids.  CXR showed pulmonary edema. She was diuresed without effect on her heart rate, and metoprolol was started and then increased without effect. On 5/21 she became hypoxic, fluids were stopped and she was further diuresed. Echo with no abnormalities, CTA was negative for PE and showed likely pneumonia. Cardiology was consulted and patient was started on diltiazem drip for a-flutter with subsequent rate control and normal sinus rhythm on telemetry.  Diuresis was continued  with good urine output and improved shortness of breath. Diltiazem drip was converted to oral with maintained rate control for 3 days. She became tachycardic (HR 150s-160s) , again overnight 5/26. IV metoprolol was given several times at wide intervals with no lasting effect. Cardiology was re-consulted and increased her oral diltiazem dose with good effect. She had an episode of rate-controlled a-flutter on 5/29 with a short run of asymptomatic VT; Cardiology was made aware and gave no change in recommendations. CT chest was obtained and revealed likely pulmonary edema as well as new RLL opacities. She was discharged on Diltiazem 12 hr capsules '240mg'$  PO every 12 hours, metoprolol '50mg'$  PO every 12 hours,Tacrolimus 2.'5mg'$  BID, Oxygen 2L via NC-  titrate between 1-6 LPM,  albuterol/Ipatropium inhaler and  nicotine '24mg'$ /24hr patch, lozenges. Since discharge Geneieve relays that her SOB has improved and she is not using oxygen or inhalers. She continues with fatigue but is able to complete her ADLs without symptoms. She is able to go for short walks ie: grocery store but feels exhausted afterwards. She denies chest pain, palpitations, DOE, SOB, PND, orthopnea, edema. She is followed closely by Heme/Onc with blood work and tacrolimus levels twice weekly. She was notified  this morning that her potassium was elevated (6.0) and kidney function worsened (creat 2.06). She was advised to start florinef and decrease her tacrolimus dose per Heme/Onc. She recently underwent a bone marrow biopsy revealing successful bone marrow transplant according to the pt.       Past Medical History:   Diagnosis Date    Atrial fibrillation     fib/flutter 05/2015    CHF (congestive heart failure)     pulmonary edema 05/28/15    GERD (gastroesophageal reflux disease)     Leukocytosis 09/22/2013    post allogenic MUD (F)  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    Conditioned w/ Busulfan 130 mg/m2/d and Fludarabine 40 mg/m2/d x4 days followed by 10/10 HLA MUD (F) Allogeneic PBSCT (pt/donor: ABO: A+/A+, CMV N/N)    Pulmonary edema     Shortness of breath      Past Surgical History:   Procedure Laterality Date    ROTATOR CUFF REPAIR Right        Review of Systems   Constitutional: Positive for malaise/fatigue. Negative for chills and fever.   HENT: Negative.    Respiratory: Positive  for shortness of breath. Negative for cough.         See HPI   Cardiovascular: Negative.  Negative for chest pain, palpitations, orthopnea, claudication, leg swelling and PND.   Gastrointestinal: Positive for heartburn and nausea.   Neurological: Negative.  Negative for dizziness.   Psychiatric/Behavioral: Negative.      Medications     Current Outpatient Prescriptions   Medication Sig    pantoprazole (PROTONIX) 40 MG EC tablet Take 1 tablet (40 mg total) by mouth 2  times daily   Swallow whole. Do not crush, break, or chew.    sulfamethoxazole-trimethoprim (BACTRIM DS,SEPTRA DS) 800-160 MG per tablet Take 1 tablet by mouth three times a week    MAGNESIUM PO Take 2 tablets by mouth 2 times daily       acyclovir (ZOVIRAX) 400 MG tablet Take 1 tablet (400 mg total) by mouth 2 times daily    fluconazole (DIFLUCAN) 200 MG tablet Take 1 tablet (200 mg total) by mouth nightly    metoprolol (LOPRESSOR) 50 MG tablet Take 1 tablet (50 mg total) by mouth 2 times daily    ondansetron (ZOFRAN) 4 MG tablet Take 1 tablet (4 mg total) by mouth 3 times daily as needed (nausea)    tacrolimus (PROGRAF) 0.5 MG capsule Take 1 capsule (0.5 mg total) by mouth 2 times daily   And as directed by MD/NP    prochlorperazine (COMPAZINE) 10 MG tablet Take 1 tablet (10 mg total) by mouth 4 times daily as needed for Nausea    escitalopram (LEXAPRO) 10 MG tablet Take 15 mg by mouth daily    fludrocortisone (FLORINEF) 0.1 mg tablet Take 1 tablet (0.1 mg total) by mouth daily    apixaban (ELIQUIS) 5 MG tablet Take 1 tablet (5 mg total) by mouth 2 times daily    diltiazem (CARDIZEM SR) 120 MG 12 hr capsule Take 2 capsules (240 mg total) by mouth 2 times daily    albuterol-ipratropium (COMBIVENT RESPIMAT) 20-100 MCG/ACT inhaler Inhale 1 puff into the lungs 4 times daily    nicotine polacrilex (COMMIT) 2 MG lozenge No more than 5 lozenges in 6 hours or 20 lozenges per 24 hours     No Known Allergies (drug, envir, food or latex)  Vitals and Physical Exam     Leona's  height is 1.676 m ('5\' 6"'$ ) and weight is 109.3 kg (241 lb). Her blood pressure is 117/64 and her pulse is 74.  Body mass index is 38.9 kg/(m^2).    Physical Exam   Constitutional: She is oriented to person, place, and time. She appears well-developed and well-nourished. No distress.   HENT:   Head: Normocephalic and atraumatic.   Neck: Normal range of motion. Neck supple. No JVD present. No thyromegaly present.   Cardiovascular: Normal  rate, regular rhythm, S1 normal, S2 normal, normal heart sounds and intact distal pulses.  Exam reveals no gallop and no friction rub.    No murmur heard.  Pulmonary/Chest: Effort normal and breath sounds normal. No respiratory distress.   Abdominal: Soft. Bowel sounds are normal. She exhibits distension.   Musculoskeletal: Normal range of motion. She exhibits no edema.   Neurological: She is alert and oriented to person, place, and time.   Skin: Skin is warm and dry.   Psychiatric: She has a normal mood and affect. Her behavior is normal. Judgment and thought content normal.     Laboratory Data     Hematology:   Results in Past 365  Days  Result Component Current Result Previous Result   WBC 5.0 (07/19/2015) 4.4 (07/15/2015)   Hemoglobin 8.9 (L) (07/19/2015) 8.9 (L) (07/15/2015)   Hematocrit 26.6 (L) (07/19/2015) 26.5 (L) (07/15/2015)   Platelets 179 (L) (07/19/2015) 178 (L) (07/15/2015)     Chemistry:   Results in Past 365 Days  Result Component Current Result Previous Result   Sodium 134 (07/19/2015) 133 (07/15/2015)   Potassium 6.0 (HH) (07/19/2015) 5.9 (H) (07/15/2015)   Creatinine 2.06 (H) (07/19/2015) 2.04 (H) (07/15/2015)   Glucose 163 (H) (07/19/2015) 109 (H) (07/15/2015)   Calcium 10.0 (07/19/2015) 10.1 (07/15/2015)   AST 26 (07/19/2015) 29 (07/15/2015)   ALT 32 (07/19/2015) 38 (H) (07/15/2015)   TSH 3.63 (12/31/2014) Not in Time Range     Coagulation Studies:   Results in Past 365 Days  Result Component Current Result Previous Result   aPTT 26.5 (05/11/2015) 23.8 (L) (05/03/2015)   INR 1.3 (H) (05/27/2015) 1.1 (05/11/2015)     Cardiac:   No results found for requested labs within last 365 days.     Lipids:   Results in Past 365 Days  Result Component Current Result Previous Result   Triglycerides 156 (!) (05/26/2015) 347 (!) (05/11/2015)     Cardiac/Imaging Data     ECG: NSR 68       Echo Complete 05/29/2015    Narrative  Tachycardia NOS.   Mildly hypertrophied LV with left atrial enlargement.    Normal calculated resting LVEF (60%) with reduced  stroke volume but no   significant wall motion abnormalities.    No significant valvular abnormalities.    Normal right heart size and function with estimated normal pulmonary   artery pressures.        Impression and Plan     Patient Active Problem List   Diagnosis Code    MPN (myeloproliferative neoplasm) D47.1    Myeloproliferative disease D47.1    post allogenic MUD (F)  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 Z94.81    Immunocompromised D84.9    Hypomagnesemia E83.42    Impaired mobility and ADLs Z74.09    Hypoxia R09.02    On home oxygen therapy Z99.81    Tongue coating and burning sensation with eating  K14.3    Skin changes related to chemotherapy R23.4, T45.1X5A    Hyperkalemia E87.5       This is an 55 y.o. female with myeloproliferative neoplasm s/p peripheral blood stem cell transplant on 5/9 who developed AF/ flutter while hospitalized and was started on diltiazem and metoprolol. She presents today for follow up. She denies cardiac symptoms, endorsing fatigue, nausea associated with chemotherapy. She is euvolemic on exam. Echo 05/2015 revealed normal LVEF 60%. Her BP is within goal. EKG reveals NSR HR 74. She is hyperkalemic (K+ 6.0) and is being treated with florinef with close follow up per heme/onc. Creatinine elevated 2.06: tacro dosing decreased to 1.48m BID per heme/onc. Given propensity to hypercoagulable state 2/2 neoplasm as well as CHADSVasc score of 2, anticoagulation with Eliquis will be initiated. I have discussed this with Dr. LLytle Butteand she is in agreement. From a cardiac perspective KLoneyis stable.    1. A-Fib/flutter: EKG NSR: asymptomatic without evidence of recurrent AF: CHADSVasc score = 2  - cont Cardizem SR 2476mBID and metoprolol 50 mg BID for rhythm and rate control  - Start Eliquis 80m42mID for anticoagulation    2. Hyperkalemia:  - continue florinef per heme/onc  -  labs biweekly per heme/onc  3. Elevated creatinine:   - tacro dose decreased per heme/onc  - labs biweekly per  heme/onc    4. Follow up with Dr. Kathi Der in one month             Teddy Spike, NP, Cardiology   Electronically signed on 07/19/2015 at 4:44 PM.    [place] smoking cessation

## 2015-07-20 LAB — TACROLIMUS LEVEL: Tacrolimus: 10 ng/mL

## 2015-07-21 ENCOUNTER — Ambulatory Visit: Payer: Self-pay

## 2015-07-21 ENCOUNTER — Ambulatory Visit: Payer: Self-pay | Admitting: Hematology

## 2015-07-21 ENCOUNTER — Telehealth: Payer: Self-pay

## 2015-07-21 VITALS — BP 119/63 | HR 85 | Temp 97.2°F | Resp 18 | Ht 65.75 in | Wt 240.6 lb

## 2015-07-21 DIAGNOSIS — D471 Chronic myeloproliferative disease: Secondary | ICD-10-CM

## 2015-07-21 DIAGNOSIS — T50905A Adverse effect of unspecified drugs, medicaments and biological substances, initial encounter: Secondary | ICD-10-CM

## 2015-07-21 DIAGNOSIS — E875 Hyperkalemia: Secondary | ICD-10-CM

## 2015-07-21 DIAGNOSIS — Z9481 Bone marrow transplant status: Secondary | ICD-10-CM

## 2015-07-21 LAB — CBC AND DIFFERENTIAL
Baso # K/uL: 0 10*3/uL (ref 0.0–0.1)
Basophil %: 0.6 %
Eos # K/uL: 0.2 10*3/uL (ref 0.0–0.4)
Eosinophil %: 4.2 %
Hematocrit: 26 % — ABNORMAL LOW (ref 34–45)
Hemoglobin: 8.4 g/dL — ABNORMAL LOW (ref 11.2–15.7)
IMM Granulocytes #: 0.2 10*3/uL — ABNORMAL HIGH (ref 0.0–0.1)
IMM Granulocytes: 3.8 %
Lymph # K/uL: 0.6 10*3/uL — ABNORMAL LOW (ref 1.2–3.7)
Lymphocyte %: 11.5 %
MCH: 29 pg/cell (ref 26–32)
MCHC: 33 g/dL (ref 32–36)
MCV: 88 fL (ref 79–95)
Mono # K/uL: 0.6 10*3/uL (ref 0.2–0.9)
Monocyte %: 12.5 %
Neut # K/uL: 3.3 10*3/uL (ref 1.6–6.1)
Nucl RBC # K/uL: 0 10*3/uL (ref 0.0–0.0)
Nucl RBC %: 0 /100 WBC (ref 0.0–0.2)
Platelets: 150 10*3/uL — ABNORMAL LOW (ref 160–370)
RBC: 2.9 MIL/uL — ABNORMAL LOW (ref 3.9–5.2)
RDW: 18.7 % — ABNORMAL HIGH (ref 11.7–14.4)
Seg Neut %: 67.4 %
WBC: 5 10*3/uL (ref 4.0–10.0)

## 2015-07-21 LAB — COMPREHENSIVE METABOLIC PANEL, PL
ALT, PL: 36 U/L — ABNORMAL HIGH (ref 0–35)
AST, PL: 33 U/L (ref 0–35)
Albumin, PL: 4.4 g/dL (ref 3.5–5.2)
Alk Phos, PL: 124 U/L — ABNORMAL HIGH (ref 35–105)
Anion Gap,PL: 16 (ref 7–16)
Bilirubin Total, PL: 0.4 mg/dL (ref 0.0–1.2)
CO2,Plasma: 22 mmol/L (ref 20–28)
Calcium, PL: 10.3 mg/dL — ABNORMAL HIGH (ref 8.6–10.2)
Chloride,Plasma: 94 mmol/L — ABNORMAL LOW (ref 96–108)
Creatinine: 2.18 mg/dL — ABNORMAL HIGH (ref 0.51–0.95)
GFR,Black: 29 * — AB
GFR,Caucasian: 25 * — AB
Glucose,Plasma: 135 mg/dL — ABNORMAL HIGH (ref 60–99)
Potassium,Plasma: 6.1 mmol/L — ABNORMAL HIGH (ref 3.4–4.7)
Sodium,Plasma: 132 mmol/L — ABNORMAL LOW (ref 133–145)
Total Protein, PL: 7.7 g/dL (ref 6.3–7.7)
UN,Plasma: 35 mg/dL — ABNORMAL HIGH (ref 6–20)

## 2015-07-21 LAB — TYPE AND SCREEN
ABO RH Blood Type: A POS
Antibody Screen: NEGATIVE

## 2015-07-21 LAB — TACROLIMUS LEVEL: Tacrolimus: 7.7 ng/mL

## 2015-07-21 LAB — MAGNESIUM, PLASMA: Magnesium, PL: 1.6 mEq/L (ref 1.3–2.1)

## 2015-07-21 LAB — NEUTROPHIL #-INSTRUMENT: Neutrophil #-Instrument: 3.3 10*3/uL

## 2015-07-21 LAB — LD, PL: LD, PL: 178 U/L (ref 118–225)

## 2015-07-21 MED ORDER — SODIUM POLYSTYRENE SULFONATE 15 GM/60ML PO SUSP *WRAPPED*
30.0000 g | Freq: Once | ORAL | 6 refills | Status: DC
Start: 2015-07-21 — End: 2015-07-21

## 2015-07-21 MED ORDER — SODIUM POLYSTYRENE SULFONATE 15 GM/60ML PO SUSP *WRAPPED*
30.0000 g | Freq: Once | ORAL | 6 refills | Status: AC
Start: 2015-07-21 — End: 2015-07-21

## 2015-07-21 NOTE — Progress Notes (Signed)
IVAD (outer port) accessed with good blood return. Labs obtained per orders. Flushed with normal saline and heparin and deaccessed. Tolerated well.

## 2015-07-21 NOTE — Telephone Encounter (Signed)
Patient unable to get Kayexalate at local Rx. It so happens patient has three doses at home.  I instructed her to take 15 G, 60 mls now and, if she does not have diarrhea in the next 2 hours, she should take a second dose. She should push oral fluids, mainly water, today and hold Tacrolimus this evening.  [facility] will visit her tomorrow to draw a CMP. If she does not get a call from the BMT office regarding her K by the end of the day tomorrow she should call to have someone check on the result.  Carly Higgins agreed to all of this.

## 2015-07-21 NOTE — Progress Notes (Signed)
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    PMH, Oncology History & Problem List  Reviewed    Interval History/Review of Systems  Nausea problematic; no consistent diarrhea; had some nausea last weekend.   Feels shaky  Fatigues easily, but fewer naps.   Fewer urges to start cigarettes again.     Other pertinent findings checked/documented below  []  fevers  []  palpitations []  urinary frequency   []  chills []  chest pain []  dysuria    []  dry eyes [x]  nausea/emesis  []  rash   []  ear pain []  abdominal cramping []  skin changes   []  sores in mouth []  abdominal discomfort []  pain   []  sore throat []  loose stool, non-formed [x]  fatigue   []  cough  []  diarrhea, watery []  enlarged lymph nodes   []  sputum production  []  weight loss []  bleeding   []  shortness of breath []  insufficient oral intake []  none of the above     Medications  Reviewed medications with patient today & electronically reconciled  Current Outpatient Prescriptions on File Prior to Visit   Medication Sig Dispense Refill    fludrocortisone (FLORINEF) 0.1 mg tablet Take 1 tablet (0.1 mg total) by mouth daily 30 tablet 2    apixaban (ELIQUIS) 5 MG tablet Take 1 tablet (5 mg total) by mouth 2 times daily 60 tablet 11    pantoprazole (PROTONIX) 40 MG EC tablet Take 1 tablet (40 mg total) by mouth 2 times daily   Swallow whole. Do not crush, break, or chew. 60 tablet 5    diltiazem (CARDIZEM SR) 120 MG 12 hr capsule Take 2 capsules (240 mg total) by mouth 2 times daily 60 capsule 1    sulfamethoxazole-trimethoprim (BACTRIM DS,SEPTRA DS) 800-160 MG per tablet Take 1 tablet by mouth three times a week 15 tablet 6    MAGNESIUM PO Take 2 tablets by mouth 2 times daily         acyclovir (ZOVIRAX) 400 MG tablet Take 1 tablet (400 mg total) by mouth 2 times daily 120 tablet 5    fluconazole (DIFLUCAN) 200 MG tablet Take 1 tablet (200 mg total) by mouth nightly 120  tablet 5    metoprolol (LOPRESSOR) 50 MG tablet Take 1 tablet (50 mg total) by mouth 2 times daily 60 tablet 1    ondansetron (ZOFRAN) 4 MG tablet Take 1 tablet (4 mg total) by mouth 3 times daily as needed (nausea) 90 tablet 0    tacrolimus (PROGRAF) 0.5 MG capsule Take 1 capsule (0.5 mg total) by mouth 2 times daily   And as directed by MD/NP 60 capsule 6    prochlorperazine (COMPAZINE) 10 MG tablet Take 1 tablet (10 mg total) by mouth 4 times daily as needed for Nausea 40 tablet 6    escitalopram (LEXAPRO) 10 MG tablet Take 15 mg by mouth daily      albuterol-ipratropium (COMBIVENT RESPIMAT) 20-100 MCG/ACT inhaler Inhale 1 puff into the lungs 4 times daily 1 Inhaler 3    nicotine polacrilex (COMMIT) 2 MG lozenge No more than 5 lozenges in 6 hours or 20 lozenges per 24 hours 108 lozenge 1     No current facility-administered medications on file prior to visit.        Recent labs  Reviewed, see below    VS, weight, KPS  Visit Vitals    BP 119/63    Pulse  85    Temp 36.2 C (97.2 F)    Resp 18    Ht 167 cm (5' 5.75")    Wt 109.1 kg (240 lb 9.6 oz)    SpO2 100%    BMI 39.13 kg/m2       Wt Readings from Last 3 Encounters:   07/21/15 109.1 kg (240 lb 9.6 oz)   07/21/15 109.1 kg (240 lb 9.6 oz)   07/19/15 109.3 kg (241 lb)       KPS%: 90    Physical Exam     General Appearance:  Mental status  No acute distress, non-toxic appearing  Normal affect, speech, dress, motor activity   Head:  Normocephalic, atraumatic   Eyes:  Conjunctiva/corneas clear, no eye drainage   Oral/Throat: Lips, mucosa, and tongue moist,   no postpharyngeal exudate   Neck: Supple, normal turgor   Heart: S1, S2 normal, regular rhythm, no murmur, rub or gallop   Lungs:   Respirations unlabored, clear to A&P auscultation bilaterally   Abdomen:   Soft, non-tender, bowel sounds active all four quadrants,   tympanic, no masses, no organomegaly   Extremities: No edema   Pulses: 2+ and symmetric   Skin: No rashes or lesions; has several  hyperpigmented lesions over anterior abdominal wall.      Recent Results (from the past 24 hour(s))   CBC and differential    Collection Time: 07/21/15 11:30 AM   Result Value Ref Range    WBC 5.0 4.0 - 10.0 THOU/uL    RBC 2.9 (L) 3.9 - 5.2 MIL/uL    Hemoglobin 8.4 (L) 11.2 - 15.7 g/dL    Hematocrit 26 (L) 34 - 45 %    MCV 88 79 - 95 fL    MCH 29 26 - 32 pg/cell    MCHC 33 32 - 36 g/dL    RDW 18.7 (H) 11.7 - 14.4 %    Platelets 150 (L) 160 - 370 THOU/uL    Seg Neut % 67.4 %    Lymphocyte % 11.5 %    Monocyte % 12.5 %    Eosinophil % 4.2 %    Basophil % 0.6 %    Neut # K/uL 3.3 1.6 - 6.1 THOU/uL    Lymph # K/uL 0.6 (L) 1.2 - 3.7 THOU/uL    Mono # K/uL 0.6 0.2 - 0.9 THOU/uL    Eos # K/uL 0.2 0.0 - 0.4 THOU/uL    Baso # K/uL 0.0 0.0 - 0.1 THOU/uL    Nucl RBC % 0.0 0.0 - 0.2 /100 WBC    Nucl RBC # K/uL 0.0 0.0 - 0.0 THOU/uL    IMM Granulocytes # 0.2 (H) 0.0 - 0.1 THOU/uL    IMM Granulocytes 3.8 %   Tacrolimus level    Collection Time: 07/21/15 11:30 AM   Result Value Ref Range    Tacrolimus 7.7 ng/mL   Comprehensive Metabolic Panel, PL    Collection Time: 07/21/15 11:30 AM   Result Value Ref Range    Potassium,Plasma 6.1 (H) 3.4 - 4.7 mmol/L    Sodium,Plasma 132 (L) 133 - 145 mmol/L    Anion Gap,PL 16 7 - 16    UN,Plasma 35 (H) 6 - 20 mg/dL    Creatinine 2.18 (H) 0.51 - 0.95 mg/dL    GFR,Caucasian 25 (!) *    GFR,Black 29 (!) *    Glucose,Plasma 135 (H) 60 - 99 mg/dL    Calcium, PL 10.3 (H) 8.6 - 10.2  mg/dL    Chloride,Plasma 94 (L) 96 - 108 mmol/L    CO2,Plasma 22 20 - 28 mmol/L    Alk Phos, PL 124 (H) 35 - 105 U/L    AST, PL 33 0 - 35 U/L    ALT, PL 36 (H) 0 - 35 U/L    Albumin, PL 4.4 3.5 - 5.2 g/dL    Bilirubin Total, PL 0.4 0.0 - 1.2 mg/dL    Total Protein, PL 7.7 6.3 - 7.7 g/dL   LD, PL    Collection Time: 07/21/15 11:30 AM   Result Value Ref Range    LD, PL 178 118 - 225 U/L   Magnesium, Plasma    Collection Time: 07/21/15 11:30 AM   Result Value Ref Range    Magnesium, PL 1.6 1.3 - 2.1 mEq/L   Neutrophil  #-Instrument    Collection Time: 07/21/15 11:30 AM   Result Value Ref Range    Neutrophil #-Instrument 3.3 THOU/uL   Type and screen    Collection Time: 07/21/15 11:33 AM   Result Value Ref Range    ABO RH Blood Type A RH POS     Antibody Screen Negative           Assessment and Plan  Carly Higgins is a 55 yo female with unclassifiable myeloproliferative neoplasm s/p MUD allo PBSCT on 05/17/15. The patient came to clinic today for routine follow up.    Atypical MPN S/P URD, day +65  Completed Decitabine 22m/m2 IV daily x 5 days - Cycle # 1 12/15/14  Was on intermittent/dose adjusted hydroxyurea prior to transplant, discontinued prior to BMT  Conditioning with Bu/Flu followed by MUD 10/10 HLA matched, female, allo PBSCT ABO: A+/A+, CMV: neg/neg   Day 30 marrow (6/6) hypercellular marrow, no evidence of malignancy, chimerism 2% recipient  Next marrow due ~day +100 (along with PFTs & Echo); not scheduled yet    Heme  Engrafted  Transfusion independent; remains anemic; suspect due to creatinine elevation.     ID  Hx Cholecystitis, pneumonia 05/2015  Immunocompromised  CMV PCR not required both negative    Continue acyclovir and fluconazole ppx  PCP ppx with Bactrim (can cause hyperkalemia monitor potassium); we may need to hold this as Cr is elevated as well.     May require IVIG in future, if <400 or clinical indication, will follow monthly     CV/Pulm  A-flutter- cont Cardizem SR 120 mg daily and Metoprolol 50 mg BID  Cardiology has placed her on eliquis; will monitor this with current platelet count being adequate .   Has oxygen at home, but has not needed it  Possible OSA, needs sleep study at some point soon    At Risk for GVHD  Full dose MTX given day 1,3,6. Day 11 mtx held due to elevated bilirubin and mucositis  Tacro goal 8-12  Tacro dose: will hold dose and will continue at 1.5 mg bid.   No signs of GVHD on exam today    Renal  Creatinine elevated >2.   Has been slightly increased each visit  Will hold  tacro and bactrim as noted above   Potassium is 6, so we will give a dose of kayexelate; 15g x2,     Hypomagnesium  Oral magnesium supplement: 4 tablets daily    Hyperkalemia  Taking Florinef daily  Serum K still >6; will hold this after a while.     GI  Cont protonix twice daily   Recommeded  taking Zofran TID and compazine prn (patient has these medications)    Derm  Skin hyperpigmentation related to chemotherapy  Skin in axilla peeling a bit  Discussed that this is not unexpected and will resolve    Fatigue  Not unexpected at this point after BMT, monitor    Smoking cessation--has been able to abstain thus far    Follow up  BMT clinic: weekly  Bloodwork frequency: twice weekly    BMT [name]  Referring [name]  Lives in [place], [place]

## 2015-07-22 ENCOUNTER — Telehealth: Payer: Self-pay

## 2015-07-22 ENCOUNTER — Other Ambulatory Visit: Payer: Self-pay | Admitting: Hematology

## 2015-07-22 ENCOUNTER — Encounter: Payer: Self-pay | Admitting: Gastroenterology

## 2015-07-22 LAB — CBC AND DIFFERENTIAL
Baso # K/uL: 0 10*3/uL (ref 0.0–0.1)
Basophil %: 0.6 % (ref 0.1–1.2)
Eos # K/uL: 0.2 10*3/uL (ref 0.0–0.4)
Eosinophil %: 4.1 % (ref 0.7–5.8)
Hematocrit: 26.4 % — ABNORMAL LOW (ref 34.1–44.9)
Hemoglobin: 8.8 g/dL — ABNORMAL LOW (ref 11.2–15.7)
Immature Granulocytes Absolute: 0.12 10*3/uL (ref 0.0–0.2)
Immature Granulocytes: 2.3 % — ABNORMAL HIGH (ref 0.0–2.0)
Lymph # K/uL: 0.5 10*3/uL — ABNORMAL LOW (ref 1.2–3.7)
Lymphocyte %: 9.7 % — ABNORMAL LOW (ref 19.3–51.7)
MCH: 29.1 pg (ref 25.6–32.2)
MCHC: 33.3 g/dL (ref 32.2–35.5)
MCV: 87.4 fL (ref 79.4–94.8)
Mono # K/uL: 0.6 10*3/uL (ref 0.2–0.9)
Monocyte %: 10.7 % (ref 4.7–12.5)
Neut # K/uL: 3.7 10*3/uL (ref 1.6–6.0)
Nucl RBC # K/uL: 0 /100 WBC (ref 0.0–0.2)
Platelets: 177 10*3/uL — ABNORMAL LOW (ref 182–369)
RBC Distribution Width-SD: 59.1 fL — ABNORMAL HIGH (ref 36.4–46.3)
RBC: 3.02 10*6/uL — ABNORMAL LOW (ref 3.93–5.22)
RDW: 18.6 % — ABNORMAL HIGH (ref 11.7–14.4)
Seg Neut %: 72.6 % — ABNORMAL HIGH (ref 34.0–71.1)
WBC: 5.1 10*3/uL (ref 4.0–10.0)

## 2015-07-22 LAB — COMPREHENSIVE METABOLIC PANEL
ALT: 38 U/L — ABNORMAL HIGH (ref 0–35)
AST: 31 U/L (ref 0–35)
Albumin: 4.2 g/dL (ref 3.5–5.2)
Alk Phos: 125 U/L — ABNORMAL HIGH (ref 35–105)
Anion Gap: 14 (ref 7–16)
Bilirubin,Total: 0.4 mg/dL (ref 0.0–1.2)
CO2: 23 mmol/L (ref 20–28)
Calcium: 10 mg/dL (ref 8.6–10.2)
Chloride: 99 mmol/L (ref 96–108)
Creatinine: 2.06 mg/dL — ABNORMAL HIGH (ref 0.51–0.95)
GFR,Black: 30 mL/min/{1.73_m2}
GFR,Caucasian: 25 mL/min/{1.73_m2}
Glucose: 104 mg/dL — ABNORMAL HIGH (ref 60–99)
Lab: 31 mg/dL — ABNORMAL HIGH (ref 6–20)
Potassium: 5.4 mmol/L — ABNORMAL HIGH (ref 3.4–4.7)
Sodium: 136 mmol/L (ref 133–145)
Total Protein: 7.7 g/dL (ref 6.3–7.7)

## 2015-07-22 LAB — MAGNESIUM: Magnesium: 1.8 mg/dL (ref 1.56–2.52)

## 2015-07-22 LAB — LACTATE DEHYDROGENASE: LD: 158 mmol/L (ref 118–225)

## 2015-07-22 NOTE — Telephone Encounter (Signed)
Spoke to Carly Higgins. Potassium level down to 5.4 today but Creatinine is still elevated at 2. I advised drinking at least 2 liters of water over the course of the day and continuing to abstain from potassium rich foods and drinks. She is on Florinef 0.1 mg daily.  Carly Higgins reports feeling well. She said she needed to take two doses of 15G Kayexalate in order to have diarrhea.  She will be seen in clinic on on Tuesday with labs.

## 2015-07-23 LAB — TACROLIMUS LEVEL: Tacrolimus: 6 ng/mL

## 2015-07-26 ENCOUNTER — Ambulatory Visit: Payer: Self-pay | Admitting: Oncology

## 2015-07-26 ENCOUNTER — Ambulatory Visit: Payer: Self-pay

## 2015-07-26 VITALS — BP 114/62 | HR 77 | Temp 97.5°F | Resp 18 | Ht 65.75 in | Wt 240.7 lb

## 2015-07-26 DIAGNOSIS — D849 Immunodeficiency, unspecified: Secondary | ICD-10-CM

## 2015-07-26 DIAGNOSIS — E875 Hyperkalemia: Secondary | ICD-10-CM

## 2015-07-26 DIAGNOSIS — T50905A Adverse effect of unspecified drugs, medicaments and biological substances, initial encounter: Secondary | ICD-10-CM

## 2015-07-26 DIAGNOSIS — Z9481 Bone marrow transplant status: Secondary | ICD-10-CM

## 2015-07-26 DIAGNOSIS — D471 Chronic myeloproliferative disease: Secondary | ICD-10-CM

## 2015-07-26 LAB — COMPREHENSIVE METABOLIC PANEL, PL
ALT, PL: 37 U/L — ABNORMAL HIGH (ref 0–35)
AST, PL: 33 U/L (ref 0–35)
Albumin, PL: 4.3 g/dL (ref 3.5–5.2)
Alk Phos, PL: 116 U/L — ABNORMAL HIGH (ref 35–105)
Anion Gap,PL: 14 (ref 7–16)
Bilirubin Total, PL: 0.3 mg/dL (ref 0.0–1.2)
CO2,Plasma: 23 mmol/L (ref 20–28)
Calcium, PL: 9.9 mg/dL (ref 8.6–10.2)
Chloride,Plasma: 98 mmol/L (ref 96–108)
Creatinine: 1.77 mg/dL — ABNORMAL HIGH (ref 0.51–0.95)
GFR,Black: 37 * — AB
GFR,Caucasian: 32 * — AB
Glucose,Plasma: 170 mg/dL — ABNORMAL HIGH (ref 60–99)
Potassium,Plasma: 5.3 mmol/L — ABNORMAL HIGH (ref 3.4–4.7)
Sodium,Plasma: 135 mmol/L (ref 133–145)
Total Protein, PL: 7.2 g/dL (ref 6.3–7.7)
UN,Plasma: 27 mg/dL — ABNORMAL HIGH (ref 6–20)

## 2015-07-26 LAB — CBC AND DIFFERENTIAL
Baso # K/uL: 0 10*3/uL (ref 0.0–0.1)
Basophil %: 0.4 %
Eos # K/uL: 0.2 10*3/uL (ref 0.0–0.4)
Eosinophil %: 4.3 %
Hematocrit: 26 % — ABNORMAL LOW (ref 34–45)
Hemoglobin: 8.5 g/dL — ABNORMAL LOW (ref 11.2–15.7)
IMM Granulocytes #: 0.1 10*3/uL (ref 0.0–0.1)
IMM Granulocytes: 2.4 %
Lymph # K/uL: 0.5 10*3/uL — ABNORMAL LOW (ref 1.2–3.7)
Lymphocyte %: 10.5 %
MCH: 29 pg/cell (ref 26–32)
MCHC: 33 g/dL (ref 32–36)
MCV: 88 fL (ref 79–95)
Mono # K/uL: 0.5 10*3/uL (ref 0.2–0.9)
Monocyte %: 10.5 %
Neut # K/uL: 3.4 10*3/uL (ref 1.6–6.1)
Nucl RBC # K/uL: 0 10*3/uL (ref 0.0–0.0)
Nucl RBC %: 0 /100 WBC (ref 0.0–0.2)
Platelets: 158 10*3/uL — ABNORMAL LOW (ref 160–370)
RBC: 3 MIL/uL — ABNORMAL LOW (ref 3.9–5.2)
RDW: 18.4 % — ABNORMAL HIGH (ref 11.7–14.4)
Seg Neut %: 71.9 %
WBC: 4.7 10*3/uL (ref 4.0–10.0)

## 2015-07-26 LAB — TACROLIMUS LEVEL: Tacrolimus: 9.5 ng/mL

## 2015-07-26 LAB — MAGNESIUM, PLASMA: Magnesium, PL: 1.5 mEq/L (ref 1.3–2.1)

## 2015-07-26 LAB — LD, PL: LD, PL: 155 U/L (ref 118–225)

## 2015-07-26 LAB — NEUTROPHIL #-INSTRUMENT: Neutrophil #-Instrument: 3.4 10*3/uL

## 2015-07-26 MED ORDER — METOPROLOL TARTRATE 50 MG PO TABS *I*
50.0000 mg | ORAL_TABLET | Freq: Two times a day (BID) | ORAL | 4 refills | Status: DC
Start: 2015-07-26 — End: 2015-11-29

## 2015-07-26 MED ORDER — METOPROLOL TARTRATE 50 MG PO TABS *I*
50.0000 mg | ORAL_TABLET | Freq: Two times a day (BID) | ORAL | 3 refills | Status: DC
Start: 2015-07-26 — End: 2015-08-05

## 2015-07-26 NOTE — Progress Notes (Signed)
Pt had labs drawn via inner port of IVAD, tolerated well. Deaccessed, discharged to clinic.

## 2015-07-26 NOTE — Progress Notes (Signed)
BMT SOCIAL WORK NOTE    Contacts:  [employer]    [name] contacted SW yesterday by phone asking to see me during her clinic visit today.  SW met with Carly Higgins, who is managing well, and feeling better "everyday".  She is hoping that she will be able to return to driving in the near future.  She is in good spirits, and does not have any significant transplant related concerns.    She completed the application for the [employer] grant.  SW has completed our portion, and the application will be sent to the [employer] later today.  It will take a few weeks before they make a decision about the applcation.  SW will notify [name] at that time of the decision.    Social work will continue to follow providing psychosocial support, and Journalist, newspaper.  Should any needs arise prior to her next visit please notify social work so that I may provide assistance at that time.      [facility] SOCIAL WORK:   [facility] Social Work Therapist, nutritional:     Social Work contact made?: Yes      Method of contact:  In-person    Risk Factors:  Financial  Referral:     Time spent on this encounter (in minutes):  [place], [place]

## 2015-07-26 NOTE — Progress Notes (Signed)
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    PMH, Oncology History & Problem List  Reviewed and updated as needed    Interval History/Review of Systems  Carly Higgins is feeling better  More energy less fatigue  Denies N/V or diarrhea  No constipation   No dysuria   skin is less hyperpigmented no itching or tenderness  Is on florinef, had to use kayexalate last week for hyperkalemia  - hoping not to need again did have diarrhea after second dose has 1 left at home   Will arrange day 100 BM likely late August early Sept  Creat elevated last week Bactrim placed on hold - re-evaluate next week - likely will need pentamidine     Other pertinent findings checked/documented below     A comprehensive review of 13 systems was performed; pertinent positives checked below:  _0  fevers  _1  fast pulse _2  decreased appetite  _3  weakness   _4  chills _5  chest pain _6  altered taste  _7  poor mobility    _8  dry eyes _9  nausea _10  insufficient oral intake  _11  impaired ADLs improving   _12  visual changes  _13  emesis  _14  skin changes _15  insomnia    _16  headache  _17  abdominal cramping  _18  rash  _19  leg edema better   _20  sores in mouth _21  abdominal discomfort _22  urinary frequency _23     _24  sore tongue  _25  diarrhea, watery _26  dysuria and hematuria  _27  anxiety    _28  cough   _29  loose stool, non-formed _30  bleeding  _31  poor coping    _32  sputum production  _33  constipation   _34  pain _35  neuropathy   _36  shortness of breath/DOE   _37   weight loss  _38  fatigue better _39  none of the above        Medications  Reviewed medications with patient today & electronically reconciled  Current Outpatient Prescriptions   Medication Sig Note    metoprolol (LOPRESSOR) 50 MG tablet Take 1 tablet (50 mg total) by mouth 2 times  daily     metoprolol (LOPRESSOR) 50 MG tablet Take 1 tablet (50 mg total) by mouth 2 times daily     fludrocortisone (FLORINEF) 0.1 mg tablet Take 1 tablet (0.1 mg total) by mouth daily     apixaban (ELIQUIS) 5 MG tablet Take 1 tablet (5 mg total) by mouth 2 times daily     pantoprazole (PROTONIX) 40 MG EC tablet Take 1 tablet (40 mg total) by mouth 2 times daily   Swallow whole. Do not crush, break, or chew.     diltiazem (CARDIZEM SR) 120 MG 12 hr capsule Take 2 capsules (240 mg total) by mouth 2 times daily     sulfamethoxazole-trimethoprim (BACTRIM DS,SEPTRA DS) 800-160 MG per tablet Take 1 tablet by mouth three times a week 07/26/2015: On Hold.    MAGNESIUM PO Take 2 tablets by mouth 2 times daily        acyclovir (ZOVIRAX) 400 MG tablet Take 1 tablet (400 mg total) by mouth 2 times daily     fluconazole (DIFLUCAN) 200 MG tablet Take 1 tablet (200 mg total) by mouth nightly     ondansetron (ZOFRAN) 4 MG tablet Take  1 tablet (4 mg total) by mouth 3 times daily as needed (nausea)     tacrolimus (PROGRAF) 0.5 MG capsule Take 1 capsule (0.5 mg total) by mouth 2 times daily   And as directed by MD/NP 07/19/2015: Reduced to 1.5 mg twice daily    prochlorperazine (COMPAZINE) 10 MG tablet Take 1 tablet (10 mg total) by mouth 4 times daily as needed for Nausea     escitalopram (LEXAPRO) 10 MG tablet Take 15 mg by mouth daily     albuterol-ipratropium (COMBIVENT RESPIMAT) 20-100 MCG/ACT inhaler Inhale 1 puff into the lungs 4 times daily     nicotine polacrilex (COMMIT) 2 MG lozenge No more than 5 lozenges in 6 hours or 20 lozenges per 24 hours      No current facility-administered medications for this visit.          Recent labs - reviewed    Recent Results (from the past 72 hour(s))   CBC and differential    Collection Time: 07/26/15 10:05 AM   Result Value Ref Range    WBC 4.7 4.0 - 10.0 THOU/uL    RBC 3.0 (L) 3.9 - 5.2 MIL/uL    Hemoglobin 8.5 (L) 11.2 - 15.7 g/dL    Hematocrit 26 (L) 34 - 45 %    MCV 88  79 - 95 fL    MCH 29 26 - 32 pg/cell    MCHC 33 32 - 36 g/dL    RDW 18.4 (H) 11.7 - 14.4 %    Platelets 158 (L) 160 - 370 THOU/uL    Seg Neut % 71.9 %    Lymphocyte % 10.5 %    Monocyte % 10.5 %    Eosinophil % 4.3 %    Basophil % 0.4 %    Neut # K/uL 3.4 1.6 - 6.1 THOU/uL    Lymph # K/uL 0.5 (L) 1.2 - 3.7 THOU/uL    Mono # K/uL 0.5 0.2 - 0.9 THOU/uL    Eos # K/uL 0.2 0.0 - 0.4 THOU/uL    Baso # K/uL 0.0 0.0 - 0.1 THOU/uL    Nucl RBC % 0.0 0.0 - 0.2 /100 WBC    Nucl RBC # K/uL 0.0 0.0 - 0.0 THOU/uL    IMM Granulocytes # 0.1 0.0 - 0.1 THOU/uL    IMM Granulocytes 2.4 %   Tacrolimus level    Collection Time: 07/26/15 10:05 AM   Result Value Ref Range    Tacrolimus 9.5 ng/mL   Neutrophil #-Instrument    Collection Time: 07/26/15 10:05 AM   Result Value Ref Range    Neutrophil #-Instrument 3.4 THOU/uL   Comprehensive Metabolic Panel, PL    Collection Time: 07/26/15 10:10 AM   Result Value Ref Range    Potassium,Plasma 5.3 (H) 3.4 - 4.7 mmol/L    Sodium,Plasma 135 133 - 145 mmol/L    Anion Gap,PL 14 7 - 16    UN,Plasma 27 (H) 6 - 20 mg/dL    Creatinine 1.77 (H) 0.51 - 0.95 mg/dL    GFR,Caucasian 32 (!) *    GFR,Black 37 (!) *    Glucose,Plasma 170 (H) 60 - 99 mg/dL    Calcium, PL 9.9 8.6 - 10.2 mg/dL    Chloride,Plasma 98 96 - 108 mmol/L    CO2,Plasma 23 20 - 28 mmol/L    Alk Phos, PL 116 (H) 35 - 105 U/L    AST, PL 33 0 - 35 U/L    ALT, PL 37 (  H) 0 - 35 U/L    Albumin, PL 4.3 3.5 - 5.2 g/dL    Bilirubin Total, PL 0.3 0.0 - 1.2 mg/dL    Total Protein, PL 7.2 6.3 - 7.7 g/dL   LD, PL    Collection Time: 07/26/15 10:10 AM   Result Value Ref Range    LD, PL 155 118 - 225 U/L   Magnesium, Plasma    Collection Time: 07/26/15 10:10 AM   Result Value Ref Range    Magnesium, PL 1.5 1.3 - 2.1 mEq/L   CBC and differential    Collection Time: 07/28/15  9:05 AM   Result Value Ref Range    WBC 4.6 4.0 - 10.0 Th/mm3    RBC 3.00 (L) 3.93 - 5.22 Mil/mm3    Hemoglobin 8.7 (L) 11.2 - 15.7 g/dL    Hematocrit 26.6 (L) 34.1 - 44.9 %    MCV  88.7 79.4 - 94.8 fL    MCH 29.0 25.6 - 32.2 pg    MCHC 32.7 32.2 - 35.5 g/dL    RBC Distribution Width-SD 59.4 (H) 36.4 - 46.3 fL    RDW 18.3 (H) 11.7 - 14.4 %    Platelets 168 (L) 182 - 369 Th/mm3    Nucl RBC # K/uL 0.0 0.0 - 0.2 /100 WBC    Seg Neut % 74.7 (H) 34.0 - 71.1 %    Lymphocyte % 9.6 (L) 19.3 - 51.7 %    Monocyte % 9.4 4.7 - 12.5 %    Eosinophil % 3.9 0.7 - 5.8 %    Basophil % 0.7 0.1 - 1.2 %    Immature Granulocytes 1.7 0.0 - 2.0 %    Neut # K/uL 3.4 1.6 - 6.0 Th/mm3    Lymph # K/uL 0.4 (L) 1.2 - 3.7 Th/mm3    Mono # K/uL 0.4 0.2 - 0.9 Th/mm3    Eos # K/uL 0.2 0.0 - 0.4 Th/mm3    Baso # K/uL 0.0 0.0 - 0.1 Th/mm3    Immature Granulocytes Absolute 0.08 0.0 - 0.2 Th/mm3   Magnesium    Collection Time: 07/28/15  9:05 AM   Result Value Ref Range    Magnesium 1.70 1.56 - 2.52 mg/dL   Comprehensive metabolic panel    Collection Time: 07/28/15  9:05 AM   Result Value Ref Range    Sodium 136 133 - 145 mmol/L    Potassium 5.2 (H) 3.4 - 4.7 mmol/L    Chloride 98 96 - 108 mmol/L    CO2 23 20 - 28 mmol/L    Anion Gap 15 7 - 16    UN 31 (H) 6 - 20 mg/dL    Glucose 154 (H) 60 - 99 mg/dL    Creatinine 1.75 (H) 0.51 - 0.95 mg/dL    GFR,Caucasian 30 mL/min/1.31m    GFR,Black 37 mL/min/1.775m   Calcium 9.9 8.6 - 10.2 mg/dL    Total Protein 7.6 6.3 - 7.7 g/dL    Albumin 4.2 3.5 - 5.2 g/dL    Bilirubin,Total 0.4 0.0 - 1.2 mg/dL    Alk Phos 122 (H) 35 - 105 U/L    AST 36 (H) 0 - 35 U/L    ALT 41 (H) 0 - 35 U/L   Lactate dehydrogenase    Collection Time: 07/28/15  9:05 AM   Result Value Ref Range    LD 166 118 - 225 mmol/L         Wt Readings from Last 3 Encounters:  07/26/15 109.2 kg (240 lb 11.2 oz)   07/26/15 109.2 kg (240 lb 11.2 oz)   07/21/15 109.1 kg (240 lb 9.6 oz)     Radiology impressions (last 3 days):  No results found.        Physical Exam   BP 114/62  Pulse 77  Temp 36.4 C (97.5 F)  Resp 18  Ht 167 cm (5' 5.75")  Wt 109.2 kg (240 lb 11.2 oz)  SpO2 98%  BMI 39.15 kg/m2   VSS take in infusion   KPS  70%  Pain  0/10   No acute distress, non-toxic appearing, cooperative with exam   Appropriate affect and dress with normal speech and motor activity  scalp atraumatic, alopecia noted   PERRLA- EOMI. Conjunctiva & corneas clear, no eye drainage. Sclera anicteric OU  Lips, oral mucosa and tongue moist, without lesions; no postpharyngeal exudate Dentition good repair   Neck is supple, no rigidity   Lymph nodes: No cervical, submandibular, supraclavicular, infraclavicular, axillary, or inguinal palpated  Normal S1 & S2, regular, no murmur, rub, or gallop  Respirations unlabored, lung sounds clear to A&P bilateral auscultation  Bowel sounds are normoactive in four quadrants; abdomen is soft, non-tender, and without masses or organomegaly  Radial pulses +2 and symmetric, CMS is intact  trace bilat lower extremity edema  Neuro AOx3, speech/language WNL, moving all extremities, strength/sensation grossly intact. Normal broad based gait. Non-focal exam  Skin warm dry intact no lesions, No rash, generalized hyperpigmentation noted.   Central linesRight mediport NT site clean dry .     Assessment   Pleasant  55 yo female with MPN post allogeneic MUD (F) PBSCT, here for follow up. No clinical signs of infection or GVHD. Hyperkalemia and renal dysfunction better today, mild transaminitis noted     Plan  MPN post HSCT  Regimen: Fludarabine 40 mg/m2/d and Busulfan 130 mg/m2/d x 4 days   Transplant type: Allogeneic MUD PBSC 10/10 55 yr old female ABO : A+/O\A+. CMV titer:N/N  Transplant day 0:  05/17/15  Transplant day:  + 70  Day 30 marrow 6/6 The hypercellularity and architectural pattern is somewhat unusual, but no definitive evidence of malignancy is seen. Chimerism results indicate only low level (2%) recipient DNA.   Day 100 marrow early Sept -scheduled     Hematology:   Blood Counts stable mild anemia   ANC 3400  No signs of bleeding.  No transfusion  Central line Mediport site clean and dry     GvHD:   Donor MUD (female)  Completed all  doses of MTX   Tacrolimus 1.5 mg BID   Goal levels 8-10 as tolerated.  Level today 9.5   No change   No clinical signs of GVHD   Grade 0     ID:  Afebrile   No clinical signs of active infection  Immunocompromised  CMV PCR not required both negative  IgG 780 on 7/5, checking monthly   May require IVIG in future, if <400 or clinical indication, will follow monthly   Cont ppx acyclovir    Cont ppx diflucan  PCP ppx cont Bactrim M/W/F on hold 7/13 with inc creat - if not able to resume week of 7/24 will need to arrange neb pentamidine  Re-immunizations at 1 yr post BMT as able to give     Cardiovascular/pulmonary:   BP WNL  HR stable   Cont cardizem and metoprolol for now   Cardiology follow up as needed  Likely has OSA- will eventually need testing when stronger   H/O pulm edema while IP requiring diuresis   leg edema better today    Doing well with nicotine cravings- using nicotine patches  Refer to smoking cessation clinic as needed   Aware smoking can increase infectious risks and pot influence lung GVHD with increase inflammation   .   Renal/FEN:   Creat elevated but improved 1.77 (2.06)  Cont PO magneium -   Hyperkalemia mild, likely related to Tacro use  cont florinef 0.1 mg daily - BP stable   No dysuria or hematuria  Other lytes stable  Weight stable    Gastrointestinal:   No N/V   No diarrhea.   Cont protonix   Liver function tests stable WNL  Eating well and drinking better  LFTs mild transaminitis T bili WNL  No actigall at this time if labs worsen can start actigall 300 mg BID    Endocrine:   Stable.   Eventually bone health    Neurologic:   Nonfocal; no deficits.   No H/A or visual changes.   Monitor for neuro changes on Tacro     Musculoskeletal:   activity as tolerated   continued improvement over time   Walking more    Fatigue expected to improve over time     Derm/eyes:  No eye problems  Alopecia   Skin intact  No rashes     hyperpigmentation mild, no itching or tenderness.   If needed can use  aquaphor or radiagel     Psychosocial:   Family supportive.   Continue our support.   Marland Kitchen   BMT attending Lytle Butte   BMT coordinator Meyler   PCP Cahnhidalgo  Adjust plan of care as needed.     Follow up  BMT clinic twice weekly for now and PRN  Labs twice weekly (Usually M/Th)  BM bx scheduled for early Sept per pt request     Irene Pap, NP  07/26/15  10:45 AM

## 2015-07-28 ENCOUNTER — Other Ambulatory Visit: Payer: Self-pay | Admitting: Hematology

## 2015-07-28 ENCOUNTER — Encounter: Payer: Self-pay | Admitting: Oncology

## 2015-07-28 LAB — CBC AND DIFFERENTIAL
Baso # K/uL: 0 10*3/uL (ref 0.0–0.1)
Basophil %: 0.7 % (ref 0.1–1.2)
Eos # K/uL: 0.2 10*3/uL (ref 0.0–0.4)
Eosinophil %: 3.9 % (ref 0.7–5.8)
Hematocrit: 26.6 % — ABNORMAL LOW (ref 34.1–44.9)
Hemoglobin: 8.7 g/dL — ABNORMAL LOW (ref 11.2–15.7)
Immature Granulocytes Absolute: 0.08 10*3/uL (ref 0.0–0.2)
Immature Granulocytes: 1.7 % (ref 0.0–2.0)
Lymph # K/uL: 0.4 10*3/uL — ABNORMAL LOW (ref 1.2–3.7)
Lymphocyte %: 9.6 % — ABNORMAL LOW (ref 19.3–51.7)
MCH: 29 pg (ref 25.6–32.2)
MCHC: 32.7 g/dL (ref 32.2–35.5)
MCV: 88.7 fL (ref 79.4–94.8)
Mono # K/uL: 0.4 10*3/uL (ref 0.2–0.9)
Monocyte %: 9.4 % (ref 4.7–12.5)
Neut # K/uL: 3.4 10*3/uL (ref 1.6–6.0)
Nucl RBC # K/uL: 0 /100 WBC (ref 0.0–0.2)
Platelets: 168 10*3/uL — ABNORMAL LOW (ref 182–369)
RBC Distribution Width-SD: 59.4 fL — ABNORMAL HIGH (ref 36.4–46.3)
RBC: 3 10*6/uL — ABNORMAL LOW (ref 3.93–5.22)
RDW: 18.3 % — ABNORMAL HIGH (ref 11.7–14.4)
Seg Neut %: 74.7 % — ABNORMAL HIGH (ref 34.0–71.1)
WBC: 4.6 10*3/uL (ref 4.0–10.0)

## 2015-07-28 LAB — COMPREHENSIVE METABOLIC PANEL
ALT: 41 U/L — ABNORMAL HIGH (ref 0–35)
AST: 36 U/L — ABNORMAL HIGH (ref 0–35)
Albumin: 4.2 g/dL (ref 3.5–5.2)
Alk Phos: 122 U/L — ABNORMAL HIGH (ref 35–105)
Anion Gap: 15 (ref 7–16)
Bilirubin,Total: 0.4 mg/dL (ref 0.0–1.2)
CO2: 23 mmol/L (ref 20–28)
Calcium: 9.9 mg/dL (ref 8.6–10.2)
Chloride: 98 mmol/L (ref 96–108)
Creatinine: 1.75 mg/dL — ABNORMAL HIGH (ref 0.51–0.95)
GFR,Black: 37 mL/min/{1.73_m2}
GFR,Caucasian: 30 mL/min/{1.73_m2}
Glucose: 154 mg/dL — ABNORMAL HIGH (ref 60–99)
Lab: 31 mg/dL — ABNORMAL HIGH (ref 6–20)
Potassium: 5.2 mmol/L — ABNORMAL HIGH (ref 3.4–4.7)
Sodium: 136 mmol/L (ref 133–145)
Total Protein: 7.6 g/dL (ref 6.3–7.7)

## 2015-07-28 LAB — LACTATE DEHYDROGENASE: LD: 166 mmol/L (ref 118–225)

## 2015-07-28 LAB — MAGNESIUM: Magnesium: 1.7 mg/dL (ref 1.56–2.52)

## 2015-07-29 LAB — TACROLIMUS LEVEL: Tacrolimus: 9 ng/mL

## 2015-07-29 NOTE — Patient Instructions (Addendum)
August 2017   Sunday Monday Tuesday Wednesday Thursday Friday Saturday             1     2     INJECTION   10:30 AM     FOLLOW UP    11:00 AM 3     4     5       6     7     8     9     10     11     INJECTION   10:00 AM     FOLLOW UP    10:30 AM 12       13     14     15     16     17     FOLLOW UP    12:30 PM   Mieszczanska, Hanna, MD [facility] [place] 18     INJECTION   10:00 AM     FOLLOW UP    10:30 AM 19       20     21     22     23     INJECTION    9:30 AM     FOLLOW UP    10:00 AM 24     25     26       27     28     29     30     31     INJECTION   10:30 AM     FOLLOW UP    11:00 AM                      September 2017   Sunday Monday Tuesday Wednesday Thursday Friday Saturday                            1     2       3     4     5     6     7     INJECTION   10:00 AM     FOLLOW UP    10:30 AM     BONE MORROW BIOPSY    11:00 AM 8     9       10     11     12     13     14     INJECTION   10:00 AM     FOLLOW UP    10:30 AM 15     16       17     18     19     20     21     INJECTION   10:30 AM     FOLLOW UP    11:00 AM 22     23       24     25     26     27     28     INJECTION    9:30 AM     FOLLOW UP   10:00 AM 29     30               If you are negative for this gene called G6PD then we will start Dapsone to  prevent PCP pneumonia this will be in place of the bactrim

## 2015-07-30 ENCOUNTER — Encounter: Payer: Self-pay | Admitting: Radiology

## 2015-08-02 ENCOUNTER — Other Ambulatory Visit: Payer: Self-pay | Admitting: Hematology

## 2015-08-02 LAB — COMPREHENSIVE METABOLIC PANEL
ALT: 34 U/L (ref 0–35)
AST: 27 U/L (ref 0–35)
Albumin: 4.2 mg/dL (ref 3.5–5.2)
Alk Phos: 128 U/L — ABNORMAL HIGH (ref 35–105)
Anion Gap: 18 ^^L — ABNORMAL HIGH (ref 7–16)
Bilirubin,Total: 0.4 mg/dL (ref 0.0–1.2)
CO2: 22 mmol/L (ref 20–28)
Calcium: 10 mg/dL (ref 8.6–10.2)
Chloride: 97 mmol/L (ref 96–108)
Creatinine: 1.58 mg/dL — ABNORMAL HIGH (ref 0.51–0.95)
GFR,Black: 41 mL/min/{1.73_m2}
GFR,Caucasian: 34 mL/min/{1.73_m2}
Glucose: 172 mg/dL — ABNORMAL HIGH (ref 60–99)
Lab: 25 mg/dL — ABNORMAL HIGH (ref 6–20)
Potassium: 5.2 mmol/L — ABNORMAL HIGH (ref 3.4–4.7)
Sodium: 137 mmol/L (ref 133–145)
Total Protein: 7.6 g/dL (ref 6.3–7.7)

## 2015-08-02 LAB — CBC AND DIFFERENTIAL
Baso # K/uL: 0 10*3/uL (ref 0.0–0.1)
Basophil %: 0.8 % (ref 0.1–1.2)
Eos # K/uL: 0.3 10*3/uL (ref 0.0–0.4)
Eosinophil %: 5.7 % (ref 0.7–5.8)
Hematocrit: 28.9 % — ABNORMAL LOW (ref 34.1–44.9)
Hemoglobin: 9.5 g/dL — ABNORMAL LOW (ref 11.2–15.7)
Immature Granulocytes Absolute: 0.07 10*3/uL (ref 0.0–0.2)
Immature Granulocytes: 1.3 % (ref 0.0–2.0)
Lymph # K/uL: 0.4 10*3/uL — ABNORMAL LOW (ref 1.2–3.7)
Lymphocyte %: 7.4 % — ABNORMAL LOW (ref 19.3–51.7)
MCH: 29.3 pg (ref 25.6–32.2)
MCHC: 32.9 g/dL (ref 32.2–35.5)
MCV: 89.2 fL (ref 79.4–94.8)
Mono # K/uL: 0.6 10*3/uL (ref 0.2–0.9)
Monocyte %: 10.4 % (ref 4.7–12.5)
Neut # K/uL: 3.9 10*3/uL (ref 1.6–6.0)
Nucl RBC # K/uL: 0 /100 WBC (ref 0.0–0.2)
Platelets: 188 10*3/uL (ref 182–369)
RBC Distribution Width-SD: 59.4 fL — ABNORMAL HIGH (ref 36.4–46.3)
RBC: 3.24 10*6/uL — ABNORMAL LOW (ref 3.93–5.22)
RDW: 18.3 % — ABNORMAL HIGH (ref 11.7–14.4)
Seg Neut %: 74.4 % — ABNORMAL HIGH (ref 34.0–71.1)
WBC: 5.3 10*3/uL (ref 4.0–10.0)

## 2015-08-02 LAB — LACTATE DEHYDROGENASE: LD: 159 mmol/L (ref 118–225)

## 2015-08-02 LAB — MAGNESIUM: Magnesium: 1.9 mg/dL (ref 1.56–2.52)

## 2015-08-03 ENCOUNTER — Encounter: Payer: Self-pay | Admitting: Gastroenterology

## 2015-08-03 LAB — TACROLIMUS LEVEL: Tacrolimus: 8 ng/mL

## 2015-08-05 ENCOUNTER — Ambulatory Visit: Payer: Self-pay

## 2015-08-05 ENCOUNTER — Ambulatory Visit: Payer: Self-pay | Admitting: Oncology

## 2015-08-05 VITALS — BP 119/74 | HR 74 | Temp 98.4°F | Resp 18 | Ht 65.75 in | Wt 237.8 lb

## 2015-08-05 DIAGNOSIS — D849 Immunodeficiency, unspecified: Secondary | ICD-10-CM

## 2015-08-05 DIAGNOSIS — R5383 Other fatigue: Secondary | ICD-10-CM

## 2015-08-05 DIAGNOSIS — Z9481 Bone marrow transplant status: Secondary | ICD-10-CM

## 2015-08-05 DIAGNOSIS — R110 Nausea: Secondary | ICD-10-CM

## 2015-08-05 DIAGNOSIS — D471 Chronic myeloproliferative disease: Secondary | ICD-10-CM

## 2015-08-05 DIAGNOSIS — H04123 Dry eye syndrome of bilateral lacrimal glands: Secondary | ICD-10-CM

## 2015-08-05 LAB — CBC AND DIFFERENTIAL
Baso # K/uL: 0 10*3/uL (ref 0.0–0.1)
Basophil %: 0.6 %
Eos # K/uL: 0.3 10*3/uL (ref 0.0–0.4)
Eosinophil %: 5.6 %
Hematocrit: 28 % — ABNORMAL LOW (ref 34–45)
Hemoglobin: 9.1 g/dL — ABNORMAL LOW (ref 11.2–15.7)
IMM Granulocytes #: 0.1 10*3/uL (ref 0.0–0.1)
IMM Granulocytes: 1.1 %
Lymph # K/uL: 0.7 10*3/uL — ABNORMAL LOW (ref 1.2–3.7)
Lymphocyte %: 12.5 %
MCH: 29 pg/cell (ref 26–32)
MCHC: 33 g/dL (ref 32–36)
MCV: 89 fL (ref 79–95)
Mono # K/uL: 0.6 10*3/uL (ref 0.2–0.9)
Monocyte %: 11.4 %
Neut # K/uL: 3.7 10*3/uL (ref 1.6–6.1)
Nucl RBC # K/uL: 0 10*3/uL (ref 0.0–0.0)
Nucl RBC %: 0 /100 WBC (ref 0.0–0.2)
Platelets: 200 10*3/uL (ref 160–370)
RBC: 3.1 MIL/uL — ABNORMAL LOW (ref 3.9–5.2)
RDW: 18.2 % — ABNORMAL HIGH (ref 11.7–14.4)
Seg Neut %: 68.8 %
WBC: 5.4 10*3/uL (ref 4.0–10.0)

## 2015-08-05 LAB — COMPREHENSIVE METABOLIC PANEL, PL
ALT, PL: 42 U/L — ABNORMAL HIGH (ref 0–35)
AST, PL: 36 U/L — ABNORMAL HIGH (ref 0–35)
Albumin, PL: 4.3 g/dL (ref 3.5–5.2)
Alk Phos, PL: 123 U/L — ABNORMAL HIGH (ref 35–105)
Anion Gap,PL: 15 (ref 7–16)
Bilirubin Total, PL: 0.3 mg/dL (ref 0.0–1.2)
CO2,Plasma: 23 mmol/L (ref 20–28)
Calcium, PL: 10 mg/dL (ref 8.6–10.2)
Chloride,Plasma: 100 mmol/L (ref 96–108)
Creatinine: 1.42 mg/dL — ABNORMAL HIGH (ref 0.51–0.95)
GFR,Black: 48 * — AB
GFR,Caucasian: 42 * — AB
Glucose,Plasma: 104 mg/dL — ABNORMAL HIGH (ref 60–99)
Potassium,Plasma: 5.4 mmol/L — ABNORMAL HIGH (ref 3.4–4.7)
Sodium,Plasma: 138 mmol/L (ref 133–145)
Total Protein, PL: 7.5 g/dL (ref 6.3–7.7)
UN,Plasma: 27 mg/dL — ABNORMAL HIGH (ref 6–20)

## 2015-08-05 LAB — G6PD PROFILE: G6PD Screen: NORMAL

## 2015-08-05 LAB — MAGNESIUM, PLASMA: Magnesium, PL: 1.4 mEq/L (ref 1.3–2.1)

## 2015-08-05 LAB — TACROLIMUS LEVEL: Tacrolimus: 6.9 ng/mL

## 2015-08-05 LAB — NEUTROPHIL #-INSTRUMENT: Neutrophil #-Instrument: 3.7 10*3/uL

## 2015-08-05 LAB — LD, PL: LD, PL: 152 U/L (ref 118–225)

## 2015-08-05 MED ORDER — DAPSONE 100 MG PO TABS *I*
100.0000 mg | ORAL_TABLET | Freq: Every day | ORAL | 6 refills | Status: DC
Start: 2015-08-05 — End: 2015-08-31

## 2015-08-05 NOTE — Progress Notes (Signed)
Pt had labs drawn via outer port of IVAD, tolerated well. Deaccessed, discharged to clinic.

## 2015-08-05 NOTE — Progress Notes (Signed)
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    PMH, Oncology History & Problem List  Reviewed    Interval History/Review of Systems  Taking Zofran TID, helping w/ nausea  Occasional loose stool  Fatigued  Mouth is better  Mild dry eyes    Other pertinent findings checked/documented below  _0  fevers  _1  palpitations _2  urinary frequency   _3  chills _4  chest pain _5  dysuria    _6  dry eyes _7  nausea/emesis  _8  rash   _9  ear pain _10  abdominal cramping _11  skin changes   _12  sores in mouth _13  abdominal discomfort _14  pain   _15  sore throat _16  loose stool, non-formed _17  fatigue   _18  cough  _19  diarrhea, watery _20  enlarged lymph nodes   _21  sputum production  _22  weight loss _23  bleeding   _24  shortness of breath _25  insufficient oral intake _26  none of the above     Medications  Reviewed medications with patient today & electronically reconciled  Current Outpatient Prescriptions on File Prior to Visit   Medication Sig Dispense Refill    metoprolol (LOPRESSOR) 50 MG tablet Take 1 tablet (50 mg total) by mouth 2 times daily 60 tablet 4    metoprolol (LOPRESSOR) 50 MG tablet Take 1 tablet (50 mg total) by mouth 2 times daily 60 tablet 3    fludrocortisone (FLORINEF) 0.1 mg tablet Take 1 tablet (0.1 mg total) by mouth daily 30 tablet 2    apixaban (ELIQUIS) 5 MG tablet Take 1 tablet (5 mg total) by mouth 2 times daily 60 tablet 11    pantoprazole (PROTONIX) 40 MG EC tablet Take 1 tablet (40 mg total) by mouth 2 times daily   Swallow whole. Do not crush, break, or chew. 60 tablet 5    diltiazem (CARDIZEM SR) 120 MG 12 hr capsule Take 2 capsules (240 mg total) by mouth 2 times daily 60 capsule 1    sulfamethoxazole-trimethoprim (BACTRIM DS,SEPTRA DS) 800-160 MG per tablet Take 1 tablet by mouth three times a week 15 tablet 6    MAGNESIUM PO Take 2 tablets by mouth 2 times daily         albuterol-ipratropium (COMBIVENT RESPIMAT)  20-100 MCG/ACT inhaler Inhale 1 puff into the lungs 4 times daily 1 Inhaler 3    acyclovir (ZOVIRAX) 400 MG tablet Take 1 tablet (400 mg total) by mouth 2 times daily 120 tablet 5    fluconazole (DIFLUCAN) 200 MG tablet Take 1 tablet (200 mg total) by mouth nightly 120 tablet 5    nicotine polacrilex (COMMIT) 2 MG lozenge No more than 5 lozenges in 6 hours or 20 lozenges per 24 hours 108 lozenge 1    ondansetron (ZOFRAN) 4 MG tablet Take 1 tablet (4 mg total) by mouth 3 times daily as needed (nausea) 90 tablet 0    tacrolimus (PROGRAF) 0.5 MG capsule Take 1 capsule (0.5 mg total) by mouth 2 times daily   And as directed by MD/NP 60 capsule 6    prochlorperazine (COMPAZINE) 10 MG tablet Take 1 tablet (10 mg total) by mouth 4 times daily as needed for Nausea 40 tablet 6    escitalopram (LEXAPRO) 10 MG tablet Take 15 mg by mouth daily       No current facility-administered medications on file prior to visit.        Recent labs  Reviewed, see below    VS, weight,  KPS  There were no vitals taken for this visit.    Wt Readings from Last 3 Encounters:   07/26/15 109.2 kg (240 lb 11.2 oz)   07/26/15 109.2 kg (240 lb 11.2 oz)   07/21/15 109.1 kg (240 lb 9.6 oz)       KPS%: 90    Physical Exam     General Appearance:  Mental status  No acute distress, non-toxic appearing  Normal affect, speech, dress, motor activity   Head:  Normocephalic, atraumatic   Eyes:  Conjunctiva/corneas clear, no eye drainage   Oral/Throat: Lips, mucosa, and tongue moist,   no postpharyngeal exudate   Neck: Supple, normal turgor   Heart: S1, S2 normal, regular rhythm, no murmur, rub or gallop   Lungs:   Respirations unlabored, clear to A&P auscultation bilaterally   Abdomen:   Soft, non-tender, bowel sounds active all four quadrants,   tympanic, no masses, no organomegaly   Extremities: No edema   Pulses: 2+ and symmetric   Skin: No rashes or lesions; has several hyperpigmented lesions over anterior abdominal wall.      No results found for  this or any previous visit (from the past 24 hour(s)).       Assessment and Plan  Carly Higgins is a 55 yo female with unclassifiable myeloproliferative neoplasm and completed Decitabine 23m/m2 IV daily x 5 days - Cycle # 1 12/15/14 and had been on Hydre prior to transplant. 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     The patient came to clinic today for routine follow up.    Atypical MPN S/P URD allo, day +80  Day 30 marrow (6/6) hypercellular marrow, no evidence of malignancy, chimerism 2% recipient  Next marrow due ~day +100 (along with PFTs & Echo); scheduled on 9/7    Heme  Engrafted  Transfusion independent; remains anemic; suspect due to creatinine elevation.     At Risk for GVHD  Full dose MTX given day 1,3,6. Day 11 mtx held due to elevated bilirubin and mucositis  Tacro goal 8-12  Tacro dose: 1.5 mg bid.   Tacro level 6.9, no adjustment made  No signs of GVHD on exam today    Dry eyes recommended artificial tears PRN    ID  Hx Cholecystitis, pneumonia 05/2015  Immunocompromised  CMV PCR not required both negative    Continue acyclovir and fluconazole ppx  G6PD normal  eprescribed Dapsone 100 mg/day for PCP ppx (unable to tolerate Bactrim due to hyperkalemia)    If IgG <400 or clinical indication, will consider IVIG  IgG 780 on 7/5    CV/Pulm  Hx A-flutter  Cont Cardizem SR 120 mg daily and Metoprolol 50 mg BID  Cardiology has placed her on eliquis; will monitor this with current platelet count being adequate .   Has oxygen at home, but has not needed it  Possible OSA, needs sleep study at some point soon    Renal  Hx CKD  Acute KI on CKD r/t tacrolimus  Creatinine 1.42 improving  Oral magnesium supplement: 4 tablets daily    Hyperkalemia r/t medication and acute KI (tacro)  Cont Florinef  K 5.4    GI  Cont protonix twice daily   Zofran TID and compazine prn     Derm  Skin hyperpigmentation  related to chemotherapy  Skin in axilla peeling a bit  Discussed that this is not  unexpected and will resolve    Fatigue  Not unexpected at this point after BMT, monitor    Smoking cessation--has been able to abstain thus far    Follow up  BMT clinic: weekly  Bloodwork frequency: twice weekly    BMT [name]  Referring [name]  Lives in [place], NP    Clinical Staging & Grading of Acute Graft-versus-host Disease in Adults1  Organ involvement (maximum stage in prior 3 days)    Skin Stage2 Liver Stage Gut Stage   extent of rash using Rule of Ninea TB  in mg/dlb   volume of diarrhea in ml/day   _0  None   _1  None _2  None   _3  1   BSA <25%   _4  1   TB 2-3 _5  1   >500 or persistent nauseac   _6  2  BSA 25-50%   _7  2   TB >3-6 _8  2  >1000   _9  3   BSA >50%   _10  3   TB >6-15 _11  3  >1500   _12  4   Skin with generalized erythroderma   (diffuse erythema and scaling) & bullous formation _13  4   TB >15 _14  4   severe abdominal pain with or without ileus     aRule of nine      bDowngrade one stage if an additional cause of elevated bilirubin has been documented.  cPersistent nausea with histologic evidence of GVHD in the stomach or duodenum.     Overall grade    Graded Skin Liver GI   _15  None  None None None   _16  I Stage 1-2 None None   _17  II Stage 3 or Stage 1 or Stage 1   _18  III --- Stage 2-3 or Stage 2-4   _19  IVe Stage 4 Stage 4 ---   dCriteria for grading given as a minimum degree of organ involvement required to confer that grade.  eGrade IV may also include lesser organ involvement with an extreme decrease in performance status.    Acute GVHD diagnostic categories [in the absence of any feature(s) of chronic GVHD]3  _20  Classic acute GVHD (before day +100)  _21  Persistent, recurrent, or late acute GVHD (after day +100)   _22   Persistent   _23   Recurrent   _24   Late      References  1Przepiorka et al. (1995). Bone Marrow Transplantation 15, 826.  2Glucksberg et al. [phone]). Transplantation  19, 295-304.  3Filipovich et al. (2005). Biology of Blood and Marrow Transplantation 11, 945-956.

## 2015-08-09 NOTE — Patient Instructions (Signed)
August 2017   Sunday Monday Tuesday Wednesday Thursday Friday Saturday             1     2     INJECTION   10:30 AM     FOLLOW UP    11:00 AM 3     4     5       6     7     8     9     10     11     INJECTION   10:00 AM     FOLLOW UP    10:30 AM 12       13     14     15     16     17     FOLLOW UP    12:30 PM   Mieszczanska, Hanna, MD [facility] [place] 18     INJECTION   10:00 AM     FOLLOW UP    10:30 AM 19       20     21     22     23     INJECTION    9:30 AM     FOLLOW UP    10:00 AM 24     25     26       27     28     29     30     31     INJECTION   10:30 AM     FOLLOW UP    11:00 AM                      September 2017   Sunday Monday Tuesday Wednesday Thursday Friday Saturday                            1     2       3     4     5     6     7     INJECTION   10:00 AM     FOLLOW UP    10:30 AM     BONE MORROW BIOPSY    11:00 AM 8     9       10     11     12     13     14     INJECTION   10:00 AM     FOLLOW UP    10:30 AM 15     16       17     18     19     20     21     INJECTION   10:30 AM     FOLLOW UP    11:00 AM 22     23       24     25     26     27     28     INJECTION    9:30 AM     FOLLOW UP   10:00 AM 29     30 

## 2015-08-10 ENCOUNTER — Encounter: Payer: Self-pay | Admitting: Oncology

## 2015-08-10 ENCOUNTER — Ambulatory Visit: Payer: Self-pay

## 2015-08-10 ENCOUNTER — Ambulatory Visit: Payer: Self-pay | Admitting: Oncology

## 2015-08-10 ENCOUNTER — Telehealth: Payer: Self-pay

## 2015-08-10 VITALS — BP 112/67 | HR 77 | Temp 97.5°F | Resp 18 | Ht 65.75 in | Wt 236.2 lb

## 2015-08-10 DIAGNOSIS — Z9481 Bone marrow transplant status: Secondary | ICD-10-CM

## 2015-08-10 DIAGNOSIS — D471 Chronic myeloproliferative disease: Secondary | ICD-10-CM

## 2015-08-10 DIAGNOSIS — D849 Immunodeficiency, unspecified: Secondary | ICD-10-CM

## 2015-08-10 LAB — CBC AND DIFFERENTIAL
Baso # K/uL: 0 10*3/uL (ref 0.0–0.1)
Basophil %: 0.4 %
Eos # K/uL: 0.3 10*3/uL (ref 0.0–0.4)
Eosinophil %: 4.8 %
Hematocrit: 28 % — ABNORMAL LOW (ref 34–45)
Hemoglobin: 9.3 g/dL — ABNORMAL LOW (ref 11.2–15.7)
IMM Granulocytes #: 0.1 10*3/uL (ref 0.0–0.1)
IMM Granulocytes: 1.5 %
Lymph # K/uL: 0.6 10*3/uL — ABNORMAL LOW (ref 1.2–3.7)
Lymphocyte %: 11.6 %
MCH: 29 pg/cell (ref 26–32)
MCHC: 33 g/dL (ref 32–36)
MCV: 87 fL (ref 79–95)
Mono # K/uL: 0.5 10*3/uL (ref 0.2–0.9)
Monocyte %: 10.2 %
Neut # K/uL: 3.7 10*3/uL (ref 1.6–6.1)
Nucl RBC # K/uL: 0 10*3/uL (ref 0.0–0.0)
Nucl RBC %: 0 /100 WBC (ref 0.0–0.2)
Platelets: 205 10*3/uL (ref 160–370)
RBC: 3.2 MIL/uL — ABNORMAL LOW (ref 3.9–5.2)
RDW: 17.3 % — ABNORMAL HIGH (ref 11.7–14.4)
Seg Neut %: 71.5 %
WBC: 5.2 10*3/uL (ref 4.0–10.0)

## 2015-08-10 LAB — IGG: IgG: 867 mg/dL (ref 700–1600)

## 2015-08-10 LAB — MAGNESIUM, PLASMA: Magnesium, PL: 1.5 mEq/L (ref 1.3–2.1)

## 2015-08-10 LAB — TACROLIMUS LEVEL: Tacrolimus: 6.1 ng/mL

## 2015-08-10 LAB — COMPREHENSIVE METABOLIC PANEL, PL
ALT, PL: 39 U/L — ABNORMAL HIGH (ref 0–35)
AST, PL: 29 U/L (ref 0–35)
Albumin, PL: 4.1 g/dL (ref 3.5–5.2)
Alk Phos, PL: 117 U/L — ABNORMAL HIGH (ref 35–105)
Anion Gap,PL: 15 (ref 7–16)
Bilirubin Total, PL: 0.3 mg/dL (ref 0.0–1.2)
CO2,Plasma: 24 mmol/L (ref 20–28)
Calcium, PL: 9.9 mg/dL (ref 8.6–10.2)
Chloride,Plasma: 99 mmol/L (ref 96–108)
Creatinine: 1.5 mg/dL — ABNORMAL HIGH (ref 0.51–0.95)
GFR,Black: 45 * — AB
GFR,Caucasian: 39 * — AB
Glucose,Plasma: 118 mg/dL — ABNORMAL HIGH (ref 60–99)
Potassium,Plasma: 4.9 mmol/L — ABNORMAL HIGH (ref 3.4–4.7)
Sodium,Plasma: 138 mmol/L (ref 133–145)
Total Protein, PL: 7.2 g/dL (ref 6.3–7.7)
UN,Plasma: 24 mg/dL — ABNORMAL HIGH (ref 6–20)

## 2015-08-10 LAB — LD, PL: LD, PL: 138 U/L (ref 118–225)

## 2015-08-10 LAB — NEUTROPHIL #-INSTRUMENT: Neutrophil #-Instrument: 3.7 10*3/uL

## 2015-08-10 NOTE — Progress Notes (Signed)
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    PMH, Oncology History & Problem List  Reviewed    Interval History/Review of Systems  Taking Zofran TID, helping w/ nausea  Reported a bump on right hip, small, non tender    Other pertinent findings checked/documented below  _0  fevers  _1  palpitations _2  urinary frequency   _3  chills _4  chest pain _5  dysuria    _6  dry eyes _7  nausea _8  rash   _9  ear pain _10  abdominal cramping _11  skin changes   _12  sores in mouth _13  abdominal discomfort _14  pain   _15  sore throat _16  loose stool, non-formed _17  fatigue   _18  cough  _19  diarrhea, watery _20  enlarged lymph nodes   _21  sputum production  _22  weight loss _23  bleeding   _24  shortness of breath _25  insufficient oral intake _26  none of the above     Medications  Reviewed medications with patient today & electronically reconciled  Current Outpatient Prescriptions on File Prior to Visit   Medication Sig Dispense Refill    dapsone 100 MG tablet Take 1 tablet (100 mg total) by mouth daily for 30 days 30 tablet 6    metoprolol (LOPRESSOR) 50 MG tablet Take 1 tablet (50 mg total) by mouth 2 times daily 60 tablet 4    fludrocortisone (FLORINEF) 0.1 mg tablet Take 1 tablet (0.1 mg total) by mouth daily 30 tablet 2    apixaban (ELIQUIS) 5 MG tablet Take 1 tablet (5 mg total) by mouth 2 times daily 60 tablet 11    pantoprazole (PROTONIX) 40 MG EC tablet Take 1 tablet (40 mg total) by mouth 2 times daily   Swallow whole. Do not crush, break, or chew. 60 tablet 5    diltiazem (CARDIZEM SR) 120 MG 12 hr capsule Take 2 capsules (240 mg total) by mouth 2 times daily 60 capsule 1    sulfamethoxazole-trimethoprim (BACTRIM DS,SEPTRA DS) 800-160 MG per tablet Take 1 tablet by mouth three times a week 15 tablet 6    MAGNESIUM PO Take 2 tablets by mouth 2 times daily         albuterol-ipratropium (COMBIVENT RESPIMAT) 20-100 MCG/ACT inhaler Inhale 1 puff  into the lungs 4 times daily 1 Inhaler 3    acyclovir (ZOVIRAX) 400 MG tablet Take 1 tablet (400 mg total) by mouth 2 times daily 120 tablet 5    fluconazole (DIFLUCAN) 200 MG tablet Take 1 tablet (200 mg total) by mouth nightly 120 tablet 5    nicotine polacrilex (COMMIT) 2 MG lozenge No more than 5 lozenges in 6 hours or 20 lozenges per 24 hours 108 lozenge 1    ondansetron (ZOFRAN) 4 MG tablet Take 1 tablet (4 mg total) by mouth 3 times daily as needed (nausea) 90 tablet 0    tacrolimus (PROGRAF) 0.5 MG capsule Take 1 capsule (0.5 mg total) by mouth 2 times daily   And as directed by MD/NP 60 capsule 6    prochlorperazine (COMPAZINE) 10 MG tablet Take 1 tablet (10 mg total) by mouth 4 times daily as needed for Nausea 40 tablet 6    escitalopram (LEXAPRO) 10 MG tablet Take 15 mg by mouth daily       No current facility-administered medications on file prior to visit.        Recent labs  Reviewed, see below    VS, weight, KPS  Visit Vitals  BP 112/67    Pulse 77    Temp 36.4 C (97.5 F)    Resp 18    Ht 167 cm (5' 5.75")    Wt 107.1 kg (236 lb 3.2 oz)    SpO2 99%    BMI 38.42 kg/m2       Wt Readings from Last 3 Encounters:   08/10/15 107.1 kg (236 lb 3.2 oz)   08/10/15 107.1 kg (236 lb 3.2 oz)   08/05/15 107.9 kg (237 lb 12.8 oz)       KPS%: 90    Physical Exam     General Appearance:  Mental status  No acute distress, non-toxic appearing  Normal affect, speech, dress, motor activity   Head:  Normocephalic, atraumatic   Eyes:  Conjunctiva/corneas clear, no eye drainage   Oral/Throat: Lips, mucosa, and tongue moist,   no postpharyngeal exudate   Neck: Supple, normal turgor   Heart: S1, S2 normal, regular rhythm, no murmur, rub or gallop   Lungs:   Respirations unlabored, clear to A&P auscultation bilaterally   Abdomen:   Soft, non-tender, bowel sounds active all four quadrants,   tympanic, no masses, no organomegaly   Extremities: No edema   Pulses: 2+ and symmetric   Skin: No rashes or lesions;  has several hyperpigmented lesions over anterior abdominal wall and shins     Pea-sized, soft, mobile, non-tender cyst right hip region  Acute GVHD assessment below    Assessment and Plan  Carly Higgins is a 55 yo female with unclassifiable myeloproliferative neoplasm and completed Decitabine 58m/m2 IV daily x 5 days - Cycle # 1 12/15/14 and had been on Hydre prior to transplant. 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     The patient came to clinic today for routine follow up.    Atypical MPN S/P URD allo, day +85  Day 30 marrow (6/6) hypercellular marrow, no evidence of malignancy, chimerism 2% recipient  Next marrow due ~day +100 (along with PFTs & Echo); scheduled on 9/7    Heme  Engrafted  Transfusion independent; remains anemic    At Risk for GVHD  Full dose MTX given day 1,3,6. Day 11 mtx held due to elevated bilirubin and mucositis  Tacro goal 8-12  Tacro dose: 1.5 mg bid.   No signs of GVHD on exam today    ID  Hx Cholecystitis, pneumonia 05/2015  Immunocompromised  CMV PCR not required both negative    Continue acyclovir and fluconazole ppx  G6PD normal  eprescribed Dapsone 100 mg/day for PCP ppx (unable to tolerate Bactrim due to hyperkalemia)    If IgG <400 or clinical indication, will consider IVIG  IgG 780 on 7/5    CV/Pulm  Hx A-flutter  Cont Cardizem SR 120 mg daily and Metoprolol 50 mg BID  Cardiology has placed her on eliquis; will monitor this with current platelet count being adequate.   Has oxygen at home, but has not needed it  Possible OSA, needs sleep study at some point soon    Acute KI on CKD r/t tacrolimus  Creatinine stable  Oral magnesium supplement: 4 tablets daily    Hyperkalemia r/t medication and acute KI (tacro)  Cont Florinef  K on SST tube today 4.9    GI  Cont protonix twice daily   Zofran prn     Derm  Skin hyperpigmentation related to chemotherapy  Discussed that  this  is not unexpected and will resolve    Apparent pea sized cyst right hip region; patient will monitor for changes    Fatigue  Not unexpected at this point after BMT, monitor    Smoking cessation  Has been able to abstain thus far    Follow up  BMT clinic: weekly  Bloodwork frequency: twice weekly  Patient's cell phone reception is spotty; Mom's cell is [phone]    BMT [name]  Referring [name]  Lives in [place], NP    Clinical Staging & Grading of Acute Graft-versus-host Disease in Adults  Organ involvement (maximum stage in prior 3 days)  Skin Stage Liver Stage Gut Stage   extent of rash using Rule of Ninea TB  in mg/dlb   volume of diarrhea in ml/day   _0  None   _1  None _2  None   _3  1   BSA <25%   _4  1   TB 2-3 _5  1   >500 or persistent nauseac   _6  2  BSA 25-50%   _7  2   TB >3-6 _8  2  >1000   _9  3   BSA >50%   _10  3   TB >6-15 _11  3  >1500   _12  4   Skin with generalized erythroderma   (diffuse erythema and scaling) & bullous formation _13  4   TB >15 _14  4   severe abdominal pain with or without ileus      Overall grade  Graded Skin Liver GI   _15  None  None None None   _16  I Stage 1-2 None None   _17  II Stage 3 or Stage 1 or Stage 1   _18  III --- Stage 2-3 or Stage 2-4   _19  IVe Stage 4 Stage 4 ---

## 2015-08-10 NOTE — Progress Notes (Signed)
Patient here for port draw. Inner lumen double IVAD accessed, positive blood return, labs sent. IVAD de-accessed per protocol, flushed with 20cc normal saline, 50 units heparin flush. Next appointment set, discharged to BMT clinic.

## 2015-08-11 ENCOUNTER — Ambulatory Visit: Payer: Self-pay

## 2015-08-11 ENCOUNTER — Ambulatory Visit: Payer: Self-pay | Admitting: Hematology

## 2015-08-11 NOTE — Telephone Encounter (Signed)
Left VMs instructing Segen to increase Tacrolimus from 1.5 mg twice daity to 1.5 mg in the morning and 2 mg in the evening, per [name] NP.  I asked for a call to confirm.

## 2015-08-12 ENCOUNTER — Other Ambulatory Visit: Payer: Self-pay | Admitting: Hematology

## 2015-08-12 LAB — CBC AND DIFFERENTIAL
Baso # K/uL: 0 10*3/uL (ref 0.0–0.1)
Basophil %: 0.6 % (ref 0.1–1.2)
Eos # K/uL: 0.3 10*3/uL (ref 0.0–0.4)
Eosinophil %: 5.3 % (ref 0.7–5.8)
Hematocrit: 28.5 % — ABNORMAL LOW (ref 34.1–44.9)
Hemoglobin: 9.5 g/dL — ABNORMAL LOW (ref 11.2–15.7)
Immature Granulocytes Absolute: 0.07 10*3/uL (ref 0.0–0.2)
Immature Granulocytes: 1.3 % (ref 0.0–2.0)
Lymph # K/uL: 0.6 10*3/uL — ABNORMAL LOW (ref 1.2–3.7)
Lymphocyte %: 10.7 % — ABNORMAL LOW (ref 19.3–51.7)
MCH: 29.4 pg (ref 25.6–32.2)
MCHC: 33.3 g/dL (ref 32.2–35.5)
MCV: 88.2 fL (ref 79.4–94.8)
Mono # K/uL: 0.5 10*3/uL (ref 0.2–0.9)
Monocyte %: 8.6 % (ref 4.7–12.5)
Neut # K/uL: 4 10*3/uL (ref 1.6–6.0)
Nucl RBC # K/uL: 0 /100 WBC (ref 0.0–0.2)
Platelets: 227 10*3/uL (ref 182–369)
RBC Distribution Width-SD: 55.6 fL — ABNORMAL HIGH (ref 36.4–46.3)
RBC: 3.23 10*6/uL — ABNORMAL LOW (ref 3.93–5.22)
RDW: 17.2 % — ABNORMAL HIGH (ref 11.7–14.4)
Seg Neut %: 73.5 % — ABNORMAL HIGH (ref 34.0–71.1)
WBC: 5.4 10*3/uL (ref 4.0–10.0)

## 2015-08-12 LAB — COMPREHENSIVE METABOLIC PANEL
ALT: 32 U/L (ref 0–35)
AST: 26 U/L (ref 0–35)
Albumin: 4.2 g/dL (ref 3.5–5.2)
Alk Phos: 115 U/L — ABNORMAL HIGH (ref 35–105)
Anion Gap: 15 ^^L (ref 7–16)
Bilirubin,Total: 0.3 mg/dL (ref 0.0–1.2)
CO2: 23 mmol/L (ref 20–28)
Calcium: 10 mg/dL (ref 8.6–10.2)
Chloride: 101 mmol/L (ref 96–108)
Creatinine: 1.33 mg/dL — ABNORMAL HIGH (ref 0.51–0.95)
GFR,Black: 50 mL/min/{1.73_m2}
GFR,Caucasian: 41 mL/min/{1.73_m2}
Glucose: 133 mg/dL — ABNORMAL HIGH (ref 60–99)
Lab: 22 mg/dL — ABNORMAL HIGH (ref 6–20)
Potassium: 5.2 mmol/L — ABNORMAL HIGH (ref 3.4–4.7)
Sodium: 139 mmol/L (ref 133–145)
Total Protein: 7.4 g/dL (ref 6.3–7.7)

## 2015-08-12 LAB — MAGNESIUM: Magnesium: 1.8 mg/dL (ref 1.56–2.52)

## 2015-08-12 LAB — LACTATE DEHYDROGENASE: LD: 148 mmol/L (ref 118–225)

## 2015-08-13 LAB — TACROLIMUS LEVEL: Tacrolimus: 9.3 ng/mL

## 2015-08-14 ENCOUNTER — Encounter: Payer: Self-pay | Admitting: Gastroenterology

## 2015-08-15 NOTE — Patient Instructions (Signed)
August 2017   Sunday Monday Tuesday Wednesday Thursday Friday Saturday             1     2       3     4     5       6     7     8     9     10     11     INJECTION   10:00 AM     FOLLOW UP    10:30 AM 12       13     14     15     16     17     FOLLOW UP    12:30 PM   Mieszczanska, Hanna, MD [facility] [place] 18     INJECTION   10:00 AM     FOLLOW UP    10:30 AM 19       20     21     22     23     INJECTION    9:30 AM     FOLLOW UP    10:00 AM 24     25     26       27     28     29     30     31     INJECTION   10:30 AM     FOLLOW UP    11:00 AM                      September 2017   Sunday Monday Tuesday Wednesday Thursday Friday Saturday                            1     2       3     4     5     6     7     INJECTION   10:00 AM     FOLLOW UP    10:30 AM     BONE MORROW BIOPSY    11:00 AM 8     9       10     11     12     13     14     INJECTION   10:00 AM     FOLLOW UP    10:30 AM 15     16       17     18     19     20     21     INJECTION   10:30 AM     FOLLOW UP    11:00 AM 22     23       24     25     26     27     28     INJECTION    9:30 AM     FOLLOW UP   10:00 AM 29     30 

## 2015-08-16 ENCOUNTER — Other Ambulatory Visit: Payer: Self-pay | Admitting: Oncology

## 2015-08-16 ENCOUNTER — Other Ambulatory Visit: Payer: Self-pay

## 2015-08-16 DIAGNOSIS — Z9481 Bone marrow transplant status: Secondary | ICD-10-CM

## 2015-08-16 DIAGNOSIS — D471 Chronic myeloproliferative disease: Secondary | ICD-10-CM

## 2015-08-16 LAB — COMPREHENSIVE METABOLIC PANEL
ALT: 39 U/L — ABNORMAL HIGH (ref 0–35)
AST: 30 U/L (ref 0–35)
Albumin: 4.2 g/dL (ref 3.5–5.2)
Alk Phos: 120 U/L — ABNORMAL HIGH (ref 35–105)
Anion Gap: 16 ^^L (ref 7–16)
Bilirubin,Total: 0.3 mg/dL (ref 0.0–1.2)
CO2: 23 mmol/L (ref 20–28)
Calcium: 10.1 mg/dL (ref 8.6–10.2)
Chloride: 99 mmol/L (ref 96–108)
Creatinine: 1.39 mg/dL — ABNORMAL HIGH (ref 0.51–0.95)
GFR,Black: 48 mL/min/{1.73_m2}
GFR,Caucasian: 39 mL/min/{1.73_m2}
Glucose: 120 mg/dL — ABNORMAL HIGH (ref 60–99)
Lab: 23 mg/dL — ABNORMAL HIGH (ref 6–20)
Potassium: 5.1 mmol/L — ABNORMAL HIGH (ref 3.4–4.7)
Sodium: 138 mmol/L (ref 133–145)
Total Protein: 7.7 g/dL (ref 6.3–7.7)

## 2015-08-16 LAB — CBC AND DIFFERENTIAL
Baso # K/uL: 0 10*3/uL (ref 0.0–0.1)
Basophil %: 0.5 % (ref 0.1–1.2)
Eos # K/uL: 0.3 10*3/uL (ref 0.0–0.4)
Eosinophil %: 5.6 % (ref 0.7–5.8)
Hematocrit: 29.4 % — ABNORMAL LOW (ref 34.1–44.9)
Hemoglobin: 9.6 g/dL — ABNORMAL LOW (ref 11.2–15.7)
Immature Granulocytes Absolute: 0.05 10*3/uL (ref 0.0–0.2)
Immature Granulocytes: 0.9 % (ref 0.0–2.0)
Lymph # K/uL: 0.7 10*3/uL — ABNORMAL LOW (ref 1.2–3.7)
Lymphocyte %: 11.5 % — ABNORMAL LOW (ref 19.3–51.7)
MCH: 28.9 pg (ref 25.6–32.2)
MCHC: 32.7 g/dL (ref 32.2–35.5)
MCV: 88.6 fL (ref 79.4–94.8)
Mono # K/uL: 0.5 10*3/uL (ref 0.2–0.9)
Monocyte %: 9.3 % (ref 4.7–12.5)
Neut # K/uL: 4.1 10*3/uL (ref 1.6–6.0)
Nucl RBC # K/uL: 0 /100 WBC (ref 0.0–0.2)
Platelets: 232 10*3/uL (ref 182–369)
RBC Distribution Width-SD: 55 fL — ABNORMAL HIGH (ref 36.4–46.3)
RBC: 3.32 10*6/uL — ABNORMAL LOW (ref 3.93–5.22)
RDW: 16.9 % — ABNORMAL HIGH (ref 11.7–14.4)
Seg Neut %: 72.2 % — ABNORMAL HIGH (ref 34.0–71.1)
WBC: 5.7 10*3/uL (ref 4.0–10.0)

## 2015-08-16 LAB — MAGNESIUM: Magnesium: 1.9 mg/dL (ref 1.56–2.52)

## 2015-08-16 LAB — LACTATE DEHYDROGENASE: LD: 147 mmol/L (ref 118–225)

## 2015-08-17 LAB — TACROLIMUS LEVEL: Tacrolimus: 7.3 ng/mL

## 2015-08-19 ENCOUNTER — Ambulatory Visit: Payer: Self-pay | Admitting: Oncology

## 2015-08-19 ENCOUNTER — Ambulatory Visit: Payer: Self-pay | Admitting: Adult Health

## 2015-08-19 VITALS — BP 107/66 | HR 80 | Temp 98.6°F | Resp 18 | Ht 65.75 in | Wt 236.1 lb

## 2015-08-19 DIAGNOSIS — Z9481 Bone marrow transplant status: Secondary | ICD-10-CM

## 2015-08-19 DIAGNOSIS — D471 Chronic myeloproliferative disease: Secondary | ICD-10-CM

## 2015-08-19 DIAGNOSIS — E875 Hyperkalemia: Secondary | ICD-10-CM

## 2015-08-19 DIAGNOSIS — D849 Immunodeficiency, unspecified: Secondary | ICD-10-CM

## 2015-08-19 DIAGNOSIS — R5383 Other fatigue: Secondary | ICD-10-CM

## 2015-08-19 LAB — COMPREHENSIVE METABOLIC PANEL, PL
ALT, PL: 53 U/L — ABNORMAL HIGH (ref 0–35)
AST, PL: 41 U/L — ABNORMAL HIGH (ref 0–35)
Albumin, PL: 4.4 g/dL (ref 3.5–5.2)
Alk Phos, PL: 124 U/L — ABNORMAL HIGH (ref 35–105)
Anion Gap,PL: 15 (ref 7–16)
Bilirubin Total, PL: 0.4 mg/dL (ref 0.0–1.2)
CO2,Plasma: 23 mmol/L (ref 20–28)
Calcium, PL: 9.9 mg/dL (ref 8.6–10.2)
Chloride,Plasma: 99 mmol/L (ref 96–108)
Creatinine: 1.56 mg/dL — ABNORMAL HIGH (ref 0.51–0.95)
GFR,Black: 43 * — AB
GFR,Caucasian: 37 * — AB
Glucose,Plasma: 161 mg/dL — ABNORMAL HIGH (ref 60–99)
Potassium,Plasma: 5 mmol/L — ABNORMAL HIGH (ref 3.4–4.7)
Sodium,Plasma: 137 mmol/L (ref 133–145)
Total Protein, PL: 7.4 g/dL (ref 6.3–7.7)
UN,Plasma: 26 mg/dL — ABNORMAL HIGH (ref 6–20)

## 2015-08-19 LAB — CBC AND DIFFERENTIAL
Baso # K/uL: 0 10*3/uL (ref 0.0–0.1)
Basophil %: 0.5 %
Eos # K/uL: 0.4 10*3/uL (ref 0.0–0.4)
Eosinophil %: 5.7 %
Hematocrit: 30 % — ABNORMAL LOW (ref 34–45)
Hemoglobin: 9.6 g/dL — ABNORMAL LOW (ref 11.2–15.7)
IMM Granulocytes #: 0.1 10*3/uL (ref 0.0–0.1)
IMM Granulocytes: 0.8 %
Lymph # K/uL: 0.8 10*3/uL — ABNORMAL LOW (ref 1.2–3.7)
Lymphocyte %: 12.2 %
MCH: 29 pg/cell (ref 26–32)
MCHC: 32 g/dL (ref 32–36)
MCV: 88 fL (ref 79–95)
Mono # K/uL: 0.6 10*3/uL (ref 0.2–0.9)
Monocyte %: 9.9 %
Neut # K/uL: 4.4 10*3/uL (ref 1.6–6.1)
Nucl RBC # K/uL: 0 10*3/uL (ref 0.0–0.0)
Nucl RBC %: 0.2 /100 WBC (ref 0.0–0.2)
Platelets: 215 10*3/uL (ref 160–370)
RBC: 3.4 MIL/uL — ABNORMAL LOW (ref 3.9–5.2)
RDW: 17 % — ABNORMAL HIGH (ref 11.7–14.4)
Seg Neut %: 70.9 %
WBC: 6.1 10*3/uL (ref 4.0–10.0)

## 2015-08-19 LAB — MAGNESIUM, PLASMA: Magnesium, PL: 1.6 mEq/L (ref 1.3–2.1)

## 2015-08-19 LAB — LD, PL: LD, PL: 157 U/L (ref 118–225)

## 2015-08-19 LAB — NEUTROPHIL #-INSTRUMENT: Neutrophil #-Instrument: 4.4 10*3/uL

## 2015-08-19 LAB — TACROLIMUS LEVEL: Tacrolimus: 9 ng/mL

## 2015-08-19 NOTE — Progress Notes (Signed)
Outer lumen of IVAD accessed per protocol. Blood return noted without difficulty. IVAD heparinized and de-accessed per policy. Labs drawn per orders. Patient discharged in stable condition to clinic appointment.

## 2015-08-19 NOTE — Progress Notes (Signed)
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    PMH, Oncology History & Problem List  Reviewed    Interval History/Review of Systems  Using less Zofran twice a day  Formed, soft stools  Dry mouth  Denies dry eyes   Reports not drinking as much as she was, creatinine elevated      Other pertinent findings checked/documented below  _0  fevers  _1  palpitations _2  urinary frequency   _3  chills _4  chest pain _5  dysuria    _6  dry eyes _7  nausea _8  rash   _9  ear pain _10  abdominal cramping _11  skin changes   _12  sores in mouth _13  abdominal discomfort _14  pain   _15  sore throat _16  loose stool, non-formed _17  fatigue   _18  cough  _19  diarrhea, watery _20  enlarged lymph nodes   _21  sputum production  _22  weight loss _23  bleeding   _24  shortness of breath _25  insufficient oral intake _26  none of the above     Medications  Reviewed medications with patient today & electronically reconciled  Current Outpatient Prescriptions on File Prior to Visit   Medication Sig Dispense Refill    dapsone 100 MG tablet Take 1 tablet (100 mg total) by mouth daily for 30 days 30 tablet 6    metoprolol (LOPRESSOR) 50 MG tablet Take 1 tablet (50 mg total) by mouth 2 times daily 60 tablet 4    fludrocortisone (FLORINEF) 0.1 mg tablet Take 1 tablet (0.1 mg total) by mouth daily 30 tablet 2    apixaban (ELIQUIS) 5 MG tablet Take 1 tablet (5 mg total) by mouth 2 times daily 60 tablet 11    pantoprazole (PROTONIX) 40 MG EC tablet Take 1 tablet (40 mg total) by mouth 2 times daily   Swallow whole. Do not crush, break, or chew. 60 tablet 5    diltiazem (CARDIZEM SR) 120 MG 12 hr capsule Take 2 capsules (240 mg total) by mouth 2 times daily 60 capsule 1    MAGNESIUM PO Take 2 tablets by mouth 2 times daily         acyclovir (ZOVIRAX) 400 MG tablet Take 1 tablet (400 mg total) by mouth 2 times daily 120 tablet 5    fluconazole (DIFLUCAN) 200 MG tablet Take 1 tablet  (200 mg total) by mouth nightly 120 tablet 5    ondansetron (ZOFRAN) 4 MG tablet Take 1 tablet (4 mg total) by mouth 3 times daily as needed (nausea) 90 tablet 0    tacrolimus (PROGRAF) 0.5 MG capsule Take 1 capsule (0.5 mg total) by mouth 2 times daily   And as directed by MD/NP 60 capsule 6    prochlorperazine (COMPAZINE) 10 MG tablet Take 1 tablet (10 mg total) by mouth 4 times daily as needed for Nausea 40 tablet 6    escitalopram (LEXAPRO) 10 MG tablet Take 15 mg by mouth daily       No current facility-administered medications on file prior to visit.        Recent labs  Reviewed, see below    VS, weight, KPS  There were no vitals taken for this visit.    Wt Readings from Last 3 Encounters:   08/10/15 107.1 kg (236 lb 3.2 oz)   08/10/15 107.1 kg (236 lb 3.2 oz)   08/05/15 107.9 kg (237 lb 12.8 oz)       KPS%: 90    Physical Exam  General Appearance:  Mental status  No acute distress, non-toxic appearing  Normal affect, speech, dress, motor activity   Head:  Normocephalic, atraumatic   Eyes:  Conjunctiva/corneas clear, no eye drainage   Oral/Throat: Lips, mucosa, and tongue moist,   no postpharyngeal exudate   Neck: Supple, normal turgor   Heart: S1, S2 normal, regular rhythm, no murmur, rub or gallop   Lungs:   Respirations unlabored, clear to A&P auscultation bilaterally   Abdomen:   Soft, non-tender, bowel sounds active all four quadrants,   tympanic, no masses, no organomegaly   Extremities: No edema   Pulses: 2+ and symmetric   Skin: No rashes or lesions; has several hyperpigmented lesions over anterior abdominal wall and shins     Pea-sized, soft, mobile, non-tender cyst right hip region  Acute GVHD assessment below    Assessment and Plan  Carly Higgins is a 55 yo female with unclassifiable myeloproliferative neoplasm and completed Decitabine 21m/m2 IV daily x 5 days - Cycle # 1 12/15/14 and had been on Hydre prior to transplant. 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     The patient came to clinic today for routine follow up.    Atypical MPN S/P URD allo, day +94  Day 30 marrow (6/6) hypercellular marrow, no evidence of malignancy, chimerism 2% recipient  Next marrow due ~day +100 (along with PFTs & Echo); scheduled on 9/7    Heme  Engrafted  Transfusion independent; remains anemic    At Risk for GVHD  Full dose MTX given day 1,3,6. Day 11 mtx held due to elevated bilirubin and mucositis  Tacro goal 8-12  Tacro dose: 1.5 mg qAM and 2 mg qPM   Tacro level 9.0 today  No signs of GVHD on exam today    LFTs elevated- monitor    ID  Hx Cholecystitis, pneumonia 05/2015  Immunocompromised  CMV PCR not required both negative    Continue acyclovir and fluconazole ppx  Cont Dapsone 100 mg/day for PCP ppx (unable to tolerate Bactrim due to hyperkalemia)    If IgG <400 or clinical indication, will consider IVIG  IgG 780 on 7/5    CV/Pulm  Hx A-flutter  Cont Cardizem SR 120 mg daily and Metoprolol 50 mg BID  Cardiology has placed her on eliquis; will monitor this with current platelet count being adequate.   Has oxygen at home, but has not needed it  Possible OSA, needs sleep study at some point soon    Acute KI on CKD r/t tacrolimus  Creatinine 1.56 today (baseline prior to transplant was 0.7)  Encouraged PO intake  Oral magnesium supplement:4 tabs/day    Hyperkalemia r/t medication and acute KI (tacro)  Cont Florinef  K 5.0    GI  Cont protonix twice daily   Zofran prn     Derm  Skin hyperpigmentation related to chemotherapy    Apparent pea sized cyst right hip region; patient will monitor for changes    Fatigue  Not unexpected at this point after BMT, monitor    Smoking cessation  Has been able to abstain thus far    Follow up  BMT clinic: weekly  Bloodwork frequency: twice weekly  Patient's cell phone reception is spotty; Mom's cell is [phone]   BMT Dr. LLytle Butte Referring Dr. [place]enn [place]  Clinical Staging & Grading of Acute Graft-versus-host Disease in Adults  Organ involvement (maximum stage in prior 3 days)  Skin Stage Liver Stage Gut Stage   extent of rash using Rule of Ninea TB  in mg/dlb   volume of diarrhea in ml/day   _0  None   _1  None _2  None   _3  1   BSA <25%   _4  1   TB 2-3 _5  1   >500 or persistent nauseac   _6  2  BSA 25-50%   _7  2   TB >3-6 _8  2  >1000   _9  3   BSA >50%   _10  3   TB >6-15 _11  3  >1500   _12  4   Skin with generalized erythroderma   (diffuse erythema and scaling) & bullous formation _13  4   TB >15 _14  4   severe abdominal pain with or without ileus      Overall grade  Graded Skin Liver GI   _15  None  None None None   _16  I Stage 1-2 None None   _17  II Stage 3 or Stage 1 or Stage 1   _18  III --- Stage 2-3 or Stage 2-4   _19  IVe Stage 4 Stage 4 ---

## 2015-08-23 ENCOUNTER — Other Ambulatory Visit: Payer: Self-pay | Admitting: Oncology

## 2015-08-23 LAB — CBC AND DIFFERENTIAL
Baso # K/uL: 0 10*3/uL (ref 0.0–0.1)
Basophil %: 0.4 % (ref 0.1–1.2)
Eos # K/uL: 0.3 10*3/uL (ref 0.0–0.4)
Eosinophil %: 4.8 % (ref 0.7–5.8)
Hematocrit: 29.2 % — ABNORMAL LOW (ref 34.1–44.9)
Hemoglobin: 9.4 g/dL — ABNORMAL LOW (ref 11.2–15.7)
Immature Granulocytes Absolute: 0.05 10*3/uL (ref 0.0–0.2)
Immature Granulocytes: 1 % (ref 0.0–2.0)
Lymph # K/uL: 0.7 10*3/uL — ABNORMAL LOW (ref 1.2–3.7)
Lymphocyte %: 12.4 % — ABNORMAL LOW (ref 19.3–51.7)
MCH: 28.7 pg (ref 25.6–32.2)
MCHC: 32.2 g/dL (ref 32.2–35.5)
MCV: 89.3 fL (ref 79.4–94.8)
Mono # K/uL: 0.5 10*3/uL (ref 0.2–0.9)
Monocyte %: 9.4 % (ref 4.7–12.5)
Neut # K/uL: 3.8 10*3/uL (ref 1.6–6.0)
Nucl RBC # K/uL: 0 /100 WBC (ref 0.0–0.2)
Platelets: 229 10*3/uL (ref 182–369)
RBC Distribution Width-SD: 54.4 fL — ABNORMAL HIGH (ref 36.4–46.3)
RBC: 3.27 10*6/uL — ABNORMAL LOW (ref 3.93–5.22)
RDW: 16.6 % — ABNORMAL HIGH (ref 11.7–14.4)
Seg Neut %: 72 % — ABNORMAL HIGH (ref 34.0–71.1)
WBC: 5.2 10*3/uL (ref 4.0–10.0)

## 2015-08-24 LAB — TACROLIMUS LEVEL: Tacrolimus: 7.4 ng/mL

## 2015-08-24 NOTE — Patient Instructions (Addendum)
August 2017   Sunday Monday Tuesday Wednesday Thursday Friday Saturday             1     2       3     4     5       6     7     8     9     10     11   12       13     14     15     16     17   18   19       20     21     22     23     INJECTION    9:30 AM     FOLLOW UP    10:00 AM 24     25     26       27     28     29     30     31     INJECTION   10:30 AM     FOLLOW UP    11:00 AM                      September 2017   Sunday Monday Tuesday Wednesday Thursday Friday Saturday                            1     2       3     4     5     6     7     INJECTION   10:00 AM     FOLLOW UP    10:30 AM     BONE MORROW BIOPSY    11:00 AM 8     9       10     11     12     13     14     INJECTION   10:00 AM     FOLLOW UP    10:30 AM 15     16       17     18     19     20     21     INJECTION   10:30 AM     FOLLOW UP    11:00 AM 22     23       24     25     26     27     28     INJECTION    9:30 AM     FOLLOW UP   10:00 AM 29     30             You may drive, no restrictions as long as you are feeling well.

## 2015-08-25 ENCOUNTER — Encounter: Payer: Self-pay | Admitting: Cardiology

## 2015-08-25 ENCOUNTER — Ambulatory Visit: Payer: Self-pay | Admitting: Cardiology

## 2015-08-25 ENCOUNTER — Other Ambulatory Visit: Payer: Self-pay | Admitting: Gastroenterology

## 2015-08-25 ENCOUNTER — Encounter: Payer: Self-pay | Admitting: Gastroenterology

## 2015-08-25 VITALS — BP 97/52 | HR 63 | Ht 66.0 in | Wt 236.0 lb

## 2015-08-25 DIAGNOSIS — I4892 Unspecified atrial flutter: Secondary | ICD-10-CM

## 2015-08-25 MED ORDER — PERFLUTREN PROTEIN A MICROSPH (OPTISON) IV SUSP *I*
INTRAVENOUS | 0 refills | Status: DC
Start: 2015-08-25 — End: 2015-08-26

## 2015-08-25 NOTE — Progress Notes (Signed)
I had the pleasure of seeing Carly Higgins in cardiology follow up on 08/25/2015.     HPI:  Carly Higgins a 55 y.o. female with a history of myeloproliferative disorder  who presents for follow up visit. In May she underwent busulfan/fludarabine conditioning followed by a MUD allogeneic PBSCT. On 5/20 she suddenly became tachycardic to the 160s. CXR showed pulmonary edema. She was diuresed without effect on her heart rate, and metoprolol was started and then increased without effect. On 5/21 she became hypoxic, fluids were stopped and she was further diuresed. Echo with no abnormalities, CTA was negative for PE and showed likely pneumonia. Cardiology was consulted and patient was started on diltiazem drip for a-flutter with subsequent rate control and normal sinus rhythm on telemetry.  Diuresis was continued  with good urine output and improved shortness of breath. Diltiazem drip was converted to oral with maintained rate control for 3 days. She became tachycardic (HR 150s-160s) , again overnight 5/26. IV metoprolol was given several times at wide intervals with no lasting effect. Cardiology was re-consulted and increased her oral diltiazem dose with good effect. She had an episode of rate-controlled a-flutter on 5/29 with a short run of asymptomatic VT; Cardiology was made aware and gave no change in recommendations. She was discharged on Diltiazem 12 hr capsules 21m PO every 12 hours, metoprolol 52mPO every 12 hours,Tacrolimus 2.69m61mID, Oxygen 2L via NC-  titrate between 1-6 LPM,  albuterol/Ipatropium inhaler and nicotine 67m669mhr patch, lozenges. Since discharge Carly Higgins that her SOB has improved. She remains in NSR and is doing better.. She continues with fatigue but is able to complete her ADLs without symptoms. She denies chest pain, palpitations, DOE, SOB, PND, orthopnea, edema. She is followed closely by Heme/Onc.    Past Medical History:   Diagnosis Date    Atrial fibrillation      fib/flutter 05/2015    CHF (congestive heart failure)     pulmonary edema 05/28/15    GERD (gastroesophageal reflux disease)     Hypoxia 06/11/2015    Impaired mobility and ADLs 06/11/2015    Leukocytosis 09/22/2013    post allogenic MUD (F)  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    Conditioned w/ Busulfan 130 mg/m2/d and Fludarabine 40 mg/m2/d x4 days followed by 10/10 HLA MUD (F) Allogeneic PBSCT (pt/donor: ABO: A+/A+, CMV N/N)    Pulmonary edema     Shortness of breath      Past Surgical History:   Procedure Laterality Date    ROTATOR CUFF REPAIR Right        Review of Systems   Constitutional: Positive for malaise/fatigue. Negative for chills and fever.   HENT: Negative.    Respiratory: Positive for shortness of breath. Negative for cough.         See HPI   Cardiovascular: Negative.  Negative for chest pain, palpitations, orthopnea, claudication, leg swelling and PND.   Gastrointestinal: Positive for heartburn and nausea.   Neurological: Negative.  Negative for dizziness.   Psychiatric/Behavioral: Negative.      Medications     Current Outpatient Prescriptions   Medication Sig    biotin 5 MG tablet Take 5 mg by mouth daily    tacrolimus (PROGRAF) 1 MG capsule 1.69mg 58mAM and 2mg i42mM    dapsone 100 MG tablet Take 1 tablet (100 mg total) by mouth daily for 30 days    metoprolol (LOPRESSOR) 50 MG tablet Take 1  tablet (50 mg total) by mouth 2 times daily    fludrocortisone (FLORINEF) 0.1 mg tablet Take 1 tablet (0.1 mg total) by mouth daily    apixaban (ELIQUIS) 5 MG tablet Take 1 tablet (5 mg total) by mouth 2 times daily    pantoprazole (PROTONIX) 40 MG EC tablet Take 1 tablet (40 mg total) by mouth 2 times daily   Swallow whole. Do not crush, break, or chew.    diltiazem (CARDIZEM SR) 120 MG 12 hr capsule Take 2 capsules (240 mg total) by mouth 2 times daily    MAGNESIUM PO Take 2 tablets by mouth 2 times daily       acyclovir (ZOVIRAX) 400 MG tablet Take 1 tablet (400 mg total) by mouth 2 times daily       fluconazole (DIFLUCAN) 200 MG tablet Take 1 tablet (200 mg total) by mouth nightly    ondansetron (ZOFRAN) 4 MG tablet Take 1 tablet (4 mg total) by mouth 3 times daily as needed (nausea)    tacrolimus (PROGRAF) 0.5 MG capsule Take 1 capsule (0.5 mg total) by mouth 2 times daily   And as directed by MD/NP    prochlorperazine (COMPAZINE) 10 MG tablet Take 1 tablet (10 mg total) by mouth 4 times daily as needed for Nausea    escitalopram (LEXAPRO) 10 MG tablet Take 15 mg by mouth daily          No Known Allergies (drug, envir, food or latex)  Vitals and Physical Exam     Carly Higgins's  height is 1.676 m (5' 6") and weight is 107 kg (236 lb). Her blood pressure is 97/52 and her pulse is 63.  Body mass index is 38.09 kg/(m^2).    Physical Exam   Constitutional: She is oriented to person, place, and time. She appears well-developed and well-nourished. No distress.   HENT:   Head: Normocephalic and atraumatic.   Neck: Normal range of motion. Neck supple. No JVD present. No thyromegaly present.   Cardiovascular: Normal rate, regular rhythm, S1 normal, S2 normal, normal heart sounds and intact distal pulses.  Exam reveals no gallop and no friction rub.    No murmur heard.  Pulmonary/Chest: Effort normal and breath sounds normal. No respiratory distress.   Abdominal: Soft. Bowel sounds are normal. She exhibits distension.   Musculoskeletal: Normal range of motion. She exhibits no edema.   Neurological: She is alert and oriented to person, place, and time.   Skin: Skin is warm and dry.   Psychiatric: She has a normal mood and affect. Her behavior is normal. Judgment and thought content normal.     Laboratory Data         Lab results: 08/23/15  1300   WBC 5.2   Hemoglobin 9.4*   Hematocrit 29.2*   RBC 3.27*   Platelets 229         Chemistry        Lab results: 08/19/15  1024 08/16/15  0910   Sodium  --  138   Sodium,Plasma 137  --    Potassium  --  5.1*   Potassium,Plasma 5.0*  --    Chloride  --  99   Chloride,Plasma 99  --     CO2  --  23   CO2,Plasma 23  --    GFR,Caucasian 37* 39   GFR,Black 43* 48   UN  --  23*   UN,Plasma 26*  --    Creatinine 1.56* 1.39*  Lab results: 08/19/15  1024 08/16/15  0910   Glucose  --  120*   Glucose,Plasma 161*  --    Calcium  --  10.1   Calcium, PL 9.9  --    Total Protein  --  7.7   Total Protein, PL 7.4  --    Albumin  --  4.2   Albumin, PL 4.4  --    ALT  --  39*   ALT, PL 53*  --    AST  --  30   AST, PL 41*  --    Alk Phos  --  120*   Alk Phos, PL 124*  --    Bilirubin,Total  --  0.3   Bilirubin Total, PL 0.4  --             Lab results: 05/26/15  0315 05/11/15  1649   Triglycerides 156* 347*            Echo Complete 05/29/2015    Narrative  Tachycardia NOS.   Mildly hypertrophied LV with left atrial enlargement.    Normal calculated resting LVEF (60%) with reduced stroke volume but no   significant wall motion abnormalities.    No significant valvular abnormalities.    Normal right heart size and function with estimated normal pulmonary   artery pressures.        Impression and Plan     This is an 55 y.o. female with myeloproliferative neoplasm s/p peripheral blood stem cell transplant on 5/9 who developed AF/ flutter while hospitalized and was started on diltiazem and metoprolol. She presents today for follow up visit. She denies cardiac symptoms, endorsing fatigue, nausea associated with chemotherapy. She is euvolemic on exam. Echo 05/2015 revealed normal LVEF 60%. Her BP is within goal. EKG reveals NSR HR 74.   Given propensity to hypercoagulable state 2/2 neoplasm as well as CHADSVasc score of 2, anticoagulation with Eliquis was initiated.     1. A-Fib/flutter: EKG NSR: asymptomatic without evidence of recurrent AF: CHADSVasc score = 2  - stop Cardizem as BP low. Continue metoprolol 50 mg BID.  - On Eliquis 67m BID for anticoagulation. [facility]   Holter prior to the next visit. If no A. Fib/or A. Flutter we may be able to stop AG in the future.  Echo to reevaluate  LVEF.     2. Hyperkalemia: better    3. Elevated creatinine: - labs biweekly per heme/onc    4. Unclassifiable myeloproliferative neoplasm s/p MUD allo PBSCT on 05/17/15.         Thank-you very much for allowing me to participate in the care of KKAMERA DUBAS Please feel free to contact our office if you have any further questions/concerns.    Sincerely,    HBillie RuddyMD

## 2015-08-26 ENCOUNTER — Ambulatory Visit: Payer: Self-pay | Admitting: Oncology

## 2015-08-26 ENCOUNTER — Ambulatory Visit: Payer: Self-pay

## 2015-08-26 VITALS — BP 128/77 | HR 85 | Temp 97.5°F | Resp 18 | Ht 65.98 in | Wt 236.0 lb

## 2015-08-26 DIAGNOSIS — D849 Immunodeficiency, unspecified: Secondary | ICD-10-CM

## 2015-08-26 DIAGNOSIS — D471 Chronic myeloproliferative disease: Secondary | ICD-10-CM

## 2015-08-26 DIAGNOSIS — R5383 Other fatigue: Secondary | ICD-10-CM

## 2015-08-26 DIAGNOSIS — R110 Nausea: Secondary | ICD-10-CM

## 2015-08-26 DIAGNOSIS — Z9481 Bone marrow transplant status: Secondary | ICD-10-CM

## 2015-08-26 LAB — COMPREHENSIVE METABOLIC PANEL, PL
ALT, PL: 49 U/L — ABNORMAL HIGH (ref 0–35)
AST, PL: 44 U/L — ABNORMAL HIGH (ref 0–35)
Albumin, PL: 4.2 g/dL (ref 3.5–5.2)
Alk Phos, PL: 123 U/L — ABNORMAL HIGH (ref 35–105)
Anion Gap,PL: 15 (ref 7–16)
Bilirubin Total, PL: 0.3 mg/dL (ref 0.0–1.2)
CO2,Plasma: 24 mmol/L (ref 20–28)
Calcium, PL: 9.8 mg/dL (ref 8.6–10.2)
Chloride,Plasma: 98 mmol/L (ref 96–108)
Creatinine: 1.47 mg/dL — ABNORMAL HIGH (ref 0.51–0.95)
GFR,Black: 46 * — AB
GFR,Caucasian: 40 * — AB
Glucose,Plasma: 162 mg/dL — ABNORMAL HIGH (ref 60–99)
Potassium,Plasma: 4.9 mmol/L — ABNORMAL HIGH (ref 3.4–4.7)
Sodium,Plasma: 137 mmol/L (ref 133–145)
Total Protein, PL: 7.4 g/dL (ref 6.3–7.7)
UN,Plasma: 40 mg/dL — ABNORMAL HIGH (ref 6–20)

## 2015-08-26 LAB — CBC AND DIFFERENTIAL
Baso # K/uL: 0 10*3/uL (ref 0.0–0.1)
Basophil %: 0.5 %
Eos # K/uL: 0.3 10*3/uL (ref 0.0–0.4)
Eosinophil %: 4.9 %
Hematocrit: 28 % — ABNORMAL LOW (ref 34–45)
Hemoglobin: 9.3 g/dL — ABNORMAL LOW (ref 11.2–15.7)
IMM Granulocytes #: 0.1 10*3/uL (ref 0.0–0.1)
IMM Granulocytes: 0.9 %
Lymph # K/uL: 0.7 10*3/uL — ABNORMAL LOW (ref 1.2–3.7)
Lymphocyte %: 11.3 %
MCH: 29 pg/cell (ref 26–32)
MCHC: 33 g/dL (ref 32–36)
MCV: 89 fL (ref 79–95)
Mono # K/uL: 0.4 10*3/uL (ref 0.2–0.9)
Monocyte %: 7.7 %
Neut # K/uL: 4.3 10*3/uL (ref 1.6–6.1)
Nucl RBC # K/uL: 0 10*3/uL (ref 0.0–0.0)
Nucl RBC %: 0 /100 WBC (ref 0.0–0.2)
Platelets: 209 10*3/uL (ref 160–370)
RBC: 3.2 MIL/uL — ABNORMAL LOW (ref 3.9–5.2)
RDW: 16.8 % — ABNORMAL HIGH (ref 11.7–14.4)
Seg Neut %: 74.7 %
WBC: 5.7 10*3/uL (ref 4.0–10.0)

## 2015-08-26 LAB — NEUTROPHIL #-INSTRUMENT: Neutrophil #-Instrument: 4.3 10*3/uL

## 2015-08-26 LAB — LD, PL: LD, PL: 162 U/L (ref 118–225)

## 2015-08-26 LAB — TACROLIMUS LEVEL: Tacrolimus: 6.8 ng/mL

## 2015-08-26 LAB — MAGNESIUM, PLASMA: Magnesium, PL: 1.6 mEq/L (ref 1.3–2.1)

## 2015-08-26 NOTE — Patient Instructions (Addendum)
Hold Eliquis dose on the evening of Sept 6th and the morning of September 7th in prep for Bone Marrow Biopsy on Sept 7. Resume the evening of Sept 7, unless there is bleeding. If there is bleeding from biopsy site, call for instructions.

## 2015-08-26 NOTE — Progress Notes (Signed)
Pt inner port accessed. Blood return noted, labs drawn and sent per order. Port flushed, heparinized and deaccessed. Pt discharged to clinic.

## 2015-08-26 NOTE — Progress Notes (Signed)
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    PMH, Oncology History & Problem List  Reviewed    Interval History/Review of Systems  Occasional nausea- Zofran 1-2xs/day  Soft stools  Hair coming back in  Dry mouth stable- no mucosal changes  Encouraged oral intake      Other pertinent findings checked/documented below  []  fevers  []  palpitations []  urinary frequency   []  chills []  chest pain []  dysuria    []  dry eyes [x]  nausea []  rash   []  ear pain []  abdominal cramping []  skin changes   []  sores in mouth []  abdominal discomfort []  pain   []  sore throat []  loose stool, non-formed [x]  fatigue   []  cough  []  diarrhea, watery []  enlarged lymph nodes   []  sputum production  []  weight loss []  bleeding   []  shortness of breath [x]  insufficient oral intake []  none of the above     Medications  Reviewed medications with patient today & electronically reconciled  Current Outpatient Prescriptions on File Prior to Visit   Medication Sig Dispense Refill    biotin 5 MG tablet Take 5 mg by mouth daily      tacrolimus (PROGRAF) 1 MG capsule 1.51m in AM and 2355min PM      perflutren protein A microsph (OPTISON) injection Optison 1.55m33miluted in 20 ml NS administered slow IVP ittrated to effect based on ultrasound image enhancement.  May repeat x1. 20 mL 0    dapsone 100 MG tablet Take 1 tablet (100 mg total) by mouth daily for 30 days 30 tablet 6    metoprolol (LOPRESSOR) 50 MG tablet Take 1 tablet (50 mg total) by mouth 2 times daily 60 tablet 4    fludrocortisone (FLORINEF) 0.1 mg tablet Take 1 tablet (0.1 mg total) by mouth daily 30 tablet 2    apixaban (ELIQUIS) 5 MG tablet Take 1 tablet (5 mg total) by mouth 2 times daily 60 tablet 11    pantoprazole (PROTONIX) 40 MG EC tablet Take 1 tablet (40 mg total) by mouth 2 times daily   Swallow whole. Do not crush, break, or chew. 60 tablet 5    MAGNESIUM PO Take 2 tablets by  mouth 2 times daily         acyclovir (ZOVIRAX) 400 MG tablet Take 1 tablet (400 mg total) by mouth 2 times daily 120 tablet 5    fluconazole (DIFLUCAN) 200 MG tablet Take 1 tablet (200 mg total) by mouth nightly 120 tablet 5    ondansetron (ZOFRAN) 4 MG tablet Take 1 tablet (4 mg total) by mouth 3 times daily as needed (nausea) 90 tablet 0    tacrolimus (PROGRAF) 0.5 MG capsule Take 1 capsule (0.5 mg total) by mouth 2 times daily   And as directed by MD/NP 60 capsule 6    prochlorperazine (COMPAZINE) 10 MG tablet Take 1 tablet (10 mg total) by mouth 4 times daily as needed for Nausea 40 tablet 6    escitalopram (LEXAPRO) 10 MG tablet Take 15 mg by mouth daily       No current facility-administered medications on file prior to visit.        Recent labs  Reviewed, see below    VS, weight, KPS  There were no vitals taken for this visit.    Wt Readings from Last 3 Encounters:   08/25/15 107 kg (236  lb)   08/19/15 107.1 kg (236 lb 1.6 oz)   08/19/15 107.1 kg (236 lb 1.6 oz)       KPS%: 90    Physical Exam     General Appearance:  Mental status  No acute distress, non-toxic appearing  Normal affect, speech, dress, motor activity   Head:  Normocephalic, atraumatic   Eyes:  Conjunctiva/corneas clear, no eye drainage   Oral/Throat: Lips, mucosa, and tongue moist,   no postpharyngeal exudate   Neck: Supple, normal turgor   Heart: S1, S2 normal, regular rhythm, no murmur, rub or gallop   Lungs:   Respirations unlabored, clear to A&P auscultation bilaterally   Abdomen:   Soft, non-tender, bowel sounds active all four quadrants,   tympanic, no masses, no organomegaly   Extremities: No edema   Pulses: 2+ and symmetric   Skin: No rashes or lesions; has several hyperpigmented lesions over anterior abdominal wall and shins     Pea-sized, soft, mobile, non-tender cyst right hip region  Acute GVHD assessment below    Assessment and Plan  Carly Higgins is a 55 yo female with unclassifiable myeloproliferative neoplasm and  completed Decitabine 59m/m2 IV daily x 5 days - Cycle # 1 12/15/14 and had been on Hydre prior to transplant. 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     The patient came to clinic today for routine follow up.    Atypical MPN S/P URD allo, day +101  Day 30 marrow (6/6) hypercellular marrow, no evidence of malignancy, chimerism 2% recipient  Next marrow due ~day +100 (along with PFTs & Echo); scheduled on 9/7    Heme  Engrafted  Transfusion independent; remains anemic    At Risk for GVHD  Full dose MTX given day 1,3,6. Day 11 mtx held due to elevated bilirubin and mucositis  Tacro goal 8-12  Tacro dose: 1.5 mg qAM and 2 mg qPM   Tacro level 6.8, no dose change since no GVH s/s and plan to taper after day 100 bmbx soon  No signs of GVHD on exam today    LFTs elevated- monitor    ID  Hx Cholecystitis, pneumonia 05/2015  Immunocompromised  CMV PCR not required both negative    Continue acyclovir and fluconazole ppx  Cont Dapsone 100 mg/day for PCP ppx (unable to tolerate Bactrim due to hyperkalemia)    If IgG <400 or clinical indication, will consider IVIG  IgG 867 on 8/2    CV/Pulm  Hx A-flutter  Cont Metoprolol 50 mg BID  Followed by Dr. MKathi Der Cardiology has placed her on eliquis (will hold eliquis night before and morning of BMBX)  Off cardizem, stopped by Dr. MKathi Der Has oxygen at home, but has not needed it  Possible OSA, needs sleep study at some point soon    Acute KI on CKD r/t tacrolimus  Creatinine 1.47 today (baseline prior to transplant was 0.7)  Encouraged PO intake  Oral magnesium supplement:4 tabs/day    Hyperkalemia r/t medication and acute KI (tacro)  Cont Florinef    GI  Cont protonix twice daily   Zofran prn     Derm  Skin hyperpigmentation related to chemotherapy    Apparent pea sized cyst right hip region; patient will monitor for changes    Fatigue  Not unexpected at this  point after BMT, monitor    Smoking cessation  Has been able to  abstain thus far    Follow up  BMT clinic: weekly  Bloodwork frequency: twice weekly  Patient's cell phone reception is spotty; Mom's cell is [phone]    BMT [name]  Referring [name]  Lives in [place], NP    Clinical Staging & Grading of Acute Graft-versus-host Disease in Adults  Organ involvement (maximum stage in prior 3 days)  Skin Stage Liver Stage Gut Stage   extent of rash using Rule of Ninea TB  in mg/dlb   volume of diarrhea in ml/day   [x]  None   [x]  None [x]  None   []  1   BSA <25%   []  1   TB 2-3 []  1   >500 or persistent nauseac   []  2  BSA 25-50%   []  2   TB >3-6 []  2  >1000   []  3   BSA >50%   []  3   TB >6-15 []  3  >1500   []  4   Skin with generalized erythroderma   (diffuse erythema and scaling) & bullous formation []  4   TB >15 []  4   severe abdominal pain with or without ileus      Overall grade  Graded Skin Liver GI   [x]  None  None None None   []  I Stage 1-2 None None   []  II Stage 3 or Stage 1 or Stage 1   []  III --- Stage 2-3 or Stage 2-4   []  IVe Stage 4 Stage 4 ---

## 2015-08-29 LAB — TACROLIMUS LEVEL: Tacrolimus: 7.4 ng/mL

## 2015-08-31 ENCOUNTER — Ambulatory Visit: Payer: Self-pay

## 2015-08-31 ENCOUNTER — Ambulatory Visit: Payer: Self-pay | Admitting: Oncology

## 2015-08-31 VITALS — BP 149/72 | HR 70 | Temp 98.1°F | Resp 18 | Ht 65.75 in | Wt 235.9 lb

## 2015-08-31 DIAGNOSIS — Z9481 Bone marrow transplant status: Secondary | ICD-10-CM

## 2015-08-31 DIAGNOSIS — R5383 Other fatigue: Secondary | ICD-10-CM

## 2015-08-31 DIAGNOSIS — E875 Hyperkalemia: Secondary | ICD-10-CM

## 2015-08-31 DIAGNOSIS — N289 Disorder of kidney and ureter, unspecified: Secondary | ICD-10-CM

## 2015-08-31 DIAGNOSIS — D849 Immunodeficiency, unspecified: Secondary | ICD-10-CM

## 2015-08-31 DIAGNOSIS — D471 Chronic myeloproliferative disease: Secondary | ICD-10-CM

## 2015-08-31 LAB — COMPREHENSIVE METABOLIC PANEL, PL
ALT, PL: 39 U/L — ABNORMAL HIGH (ref 0–35)
AST, PL: 32 U/L (ref 0–35)
Albumin, PL: 4.3 g/dL (ref 3.5–5.2)
Alk Phos, PL: 101 U/L (ref 35–105)
Anion Gap,PL: 13 (ref 7–16)
Bilirubin Total, PL: 0.3 mg/dL (ref 0.0–1.2)
CO2,Plasma: 25 mmol/L (ref 20–28)
Calcium, PL: 10 mg/dL (ref 8.6–10.2)
Chloride,Plasma: 103 mmol/L (ref 96–108)
Creatinine: 1.44 mg/dL — ABNORMAL HIGH (ref 0.51–0.95)
GFR,Black: 47 * — AB
GFR,Caucasian: 41 * — AB
Glucose,Plasma: 108 mg/dL — ABNORMAL HIGH (ref 60–99)
Potassium,Plasma: 5.1 mmol/L — ABNORMAL HIGH (ref 3.4–4.7)
Sodium,Plasma: 141 mmol/L (ref 133–145)
Total Protein, PL: 7.2 g/dL (ref 6.3–7.7)
UN,Plasma: 28 mg/dL — ABNORMAL HIGH (ref 6–20)

## 2015-08-31 LAB — CBC AND DIFFERENTIAL
Baso # K/uL: 0 10*3/uL (ref 0.0–0.1)
Basophil %: 0.4 %
Eos # K/uL: 0.3 10*3/uL (ref 0.0–0.4)
Eosinophil %: 6.4 %
Hematocrit: 30 % — ABNORMAL LOW (ref 34–45)
Hemoglobin: 9.6 g/dL — ABNORMAL LOW (ref 11.2–15.7)
IMM Granulocytes #: 0.1 10*3/uL (ref 0.0–0.1)
IMM Granulocytes: 1.2 %
Lymph # K/uL: 0.7 10*3/uL — ABNORMAL LOW (ref 1.2–3.7)
Lymphocyte %: 13.6 %
MCH: 29 pg/cell (ref 26–32)
MCHC: 32 g/dL (ref 32–36)
MCV: 88 fL (ref 79–95)
Mono # K/uL: 0.5 10*3/uL (ref 0.2–0.9)
Monocyte %: 9.5 %
Neut # K/uL: 3.3 10*3/uL (ref 1.6–6.1)
Nucl RBC # K/uL: 0 10*3/uL (ref 0.0–0.0)
Nucl RBC %: 0 /100 WBC (ref 0.0–0.2)
Platelets: 204 10*3/uL (ref 160–370)
RBC: 3.4 MIL/uL — ABNORMAL LOW (ref 3.9–5.2)
RDW: 16.6 % — ABNORMAL HIGH (ref 11.7–14.4)
Seg Neut %: 68.9 %
WBC: 4.8 10*3/uL (ref 4.0–10.0)

## 2015-08-31 LAB — NEUTROPHIL #-INSTRUMENT: Neutrophil #-Instrument: 3.3 10*3/uL

## 2015-08-31 LAB — MAGNESIUM, PLASMA: Magnesium, PL: 1.6 mEq/L (ref 1.3–2.1)

## 2015-08-31 LAB — TACROLIMUS LEVEL: Tacrolimus: 5.7 ng/mL

## 2015-08-31 LAB — LD, PL: LD, PL: 158 U/L (ref 118–225)

## 2015-08-31 LAB — IGG: IgG: 825 mg/dL (ref 700–1600)

## 2015-08-31 MED ORDER — DAPSONE 100 MG PO TABS *I*
100.0000 mg | ORAL_TABLET | Freq: Every day | ORAL | 6 refills | Status: DC
Start: 2015-08-31 — End: 2015-09-29

## 2015-08-31 NOTE — Progress Notes (Signed)
Pt had labs drawn via outer port of IVAD, tolerated well. Deaccessed, discharged to clinic.

## 2015-08-31 NOTE — Progress Notes (Signed)
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    PMH, Oncology History & Problem List  Reviewed    Interval History/Review of Systems  Still has some mild occasional nausea- though is stable  Soft stools  Dry mouth stable  Eating and drinking good, weight stable  Fatigue    Other pertinent findings checked/documented below  []  fevers  []  palpitations []  urinary frequency   []  chills []  chest pain []  dysuria    []  dry eyes [x]  nausea []  rash   []  ear pain []  abdominal cramping []  skin changes   []  sores in mouth []  abdominal discomfort []  pain   []  sore throat []  loose stool, non-formed [x]  fatigue   []  cough  []  diarrhea, watery []  enlarged lymph nodes   []  sputum production  []  weight loss []  bleeding   []  shortness of breath [x]  insufficient oral intake []  none of the above     Medications  Reviewed medications with patient today & electronically reconciled  Current Outpatient Prescriptions on File Prior to Visit   Medication Sig Dispense Refill    biotin 5 MG tablet Take 5 mg by mouth daily      tacrolimus (PROGRAF) 1 MG capsule 1.42m in AM and 242min PM      dapsone 100 MG tablet Take 1 tablet (100 mg total) by mouth daily for 30 days 30 tablet 6    metoprolol (LOPRESSOR) 50 MG tablet Take 1 tablet (50 mg total) by mouth 2 times daily 60 tablet 4    fludrocortisone (FLORINEF) 0.1 mg tablet Take 1 tablet (0.1 mg total) by mouth daily 30 tablet 2    apixaban (ELIQUIS) 5 MG tablet Take 1 tablet (5 mg total) by mouth 2 times daily 60 tablet 11    pantoprazole (PROTONIX) 40 MG EC tablet Take 1 tablet (40 mg total) by mouth 2 times daily   Swallow whole. Do not crush, break, or chew. 60 tablet 5    MAGNESIUM PO Take 2 tablets by mouth 2 times daily         acyclovir (ZOVIRAX) 400 MG tablet Take 1 tablet (400 mg total) by mouth 2 times daily 120 tablet 5    fluconazole (DIFLUCAN) 200 MG tablet Take 1 tablet (200  mg total) by mouth nightly 120 tablet 5    ondansetron (ZOFRAN) 4 MG tablet Take 1 tablet (4 mg total) by mouth 3 times daily as needed (nausea) 90 tablet 0    tacrolimus (PROGRAF) 0.5 MG capsule Take 1 capsule (0.5 mg total) by mouth 2 times daily   And as directed by MD/NP 60 capsule 6    prochlorperazine (COMPAZINE) 10 MG tablet Take 1 tablet (10 mg total) by mouth 4 times daily as needed for Nausea 40 tablet 6    escitalopram (LEXAPRO) 10 MG tablet Take 15 mg by mouth daily       No current facility-administered medications on file prior to visit.        Recent labs  Reviewed, see below    VS, weight, KPS  There were no vitals taken for this visit.    Wt Readings from Last 3 Encounters:   08/26/15 107 kg (236 lb)   08/26/15 107 kg (236 lb)   08/25/15 107 kg (236 lb)       KPS%: 90    Physical Exam     General Appearance:  Mental status  No acute distress, non-toxic appearing  Normal affect, speech, dress, motor activity   Head:  Normocephalic, atraumatic   Eyes:  Conjunctiva/corneas clear, no eye drainage   Oral/Throat: Lips, mucosa, and tongue moist,   no postpharyngeal exudate   Neck: Supple, normal turgor   Heart: S1, S2 normal, regular rhythm, no murmur, rub or gallop   Lungs:   Respirations unlabored, clear to A&P auscultation bilaterally   Abdomen:   Soft, non-tender, bowel sounds active all four quadrants,   tympanic, no masses, no organomegaly   Extremities: No edema   Pulses: 2+ and symmetric   Skin: No rashes or lesions; has several hyperpigmented lesions over anterior abdominal wall and shins     Pea-sized, soft, mobile, non-tender cyst right hip region  Acute GVHD assessment below    Assessment and Plan  Carly Higgins is a 55 yo female with unclassifiable myeloproliferative neoplasm and completed Decitabine 76m/m2 IV daily x 5 days - Cycle # 1 12/15/14 and had been on Hydre prior to transplant. She was conditioned with Bu/Flu followed by a 10/10 HLA matched URD (female) Allogeneic PBSCT on  05/17/15 (pt/donor: ABO: A+/A+, CMV: neg/neg).     The patient came to clinic today for routine follow up.    Atypical MPN S/P URD allo, day +106  Day 30 marrow (6/6) hypercellular marrow, no evidence of malignancy, chimerism 2% recipient  Next marrow due ~day +100 (along with PFTs & Echo); scheduled on 9/7    Heme  Engrafted  Transfusion independent; remains anemic    At Risk for GVHD  Full dose MTX given day 1,3,6. Day 11 mtx held due to elevated bilirubin and mucositis  Tacro goal 8-12  Tacro dose: 1.5 mg qAM and 2 mg qPM   Tacro level 5.7, no dose change since no GVH s/s and plan to taper after day 100 bmbx soon  No signs of GVHD on exam today    LFTs elevated- monitor    ID  Hx Cholecystitis, pneumonia 05/2015  Immunocompromised  CMV PCR not required both negative    Continue acyclovir and fluconazole ppx  Cont Dapsone 100 mg/day for PCP ppx (unable to tolerate Bactrim due to hyperkalemia)    If IgG <400 or clinical indication, will consider IVIG  IgG 867 on 8/2    CV/Pulm  Hx A-flutter  Cont Metoprolol 50 mg BID  Followed by Dr. MKathi Der Cardiology has placed her on eliquis (will hold eliquis night before and morning of BMBX)  Off cardizem, stopped by Dr. MKathi Der Has oxygen at home, but has not needed it  Possible OSA, needs sleep study at some point soon    Acute KI on CKD r/t tacrolimus  Creatinine 1.44 today (baseline prior to transplant was 0.7)  Encouraged PO intake  Oral magnesium supplement:4 tabs/day    Hyperkalemia r/t medication and acute KI (tacro)  Cont Florinef    GI  Cont protonix twice daily   Zofran prn     Derm  Skin hyperpigmentation related to chemotherapy    Apparent pea sized cyst right hip region; patient will monitor for changes    Fatigue  Not unexpected at this point after BMT, monitor    Smoking cessation  Has been able to abstain thus far    Follow up  BMT clinic: weekly  Bloodwork frequency: twice weekly  Patient's cell phone reception is spotty; Mom's cell is  [phone]   BMT Dr. LLytle Butte Referring Dr. CLenn Cal  Lives in [place], NP    Clinical Staging & Grading of Acute Graft-versus-host Disease in Adults  Organ involvement (maximum stage in prior 3 days)  Skin Stage Liver Stage Gut Stage   extent of rash using Rule of Ninea TB  in mg/dlb   volume of diarrhea in ml/day   [x]  None   [x]  None [x]  None   []  1   BSA <25%   []  1   TB 2-3 []  1   >500 or persistent nauseac   []  2  BSA 25-50%   []  2   TB >3-6 []  2  >1000   []  3   BSA >50%   []  3   TB >6-15 []  3  >1500   []  4   Skin with generalized erythroderma   (diffuse erythema and scaling) & bullous formation []  4   TB >15 []  4   severe abdominal pain with or without ileus      Overall grade  Graded Skin Liver GI   [x]  None  None None None   []  I Stage 1-2 None None   []  II Stage 3 or Stage 1 or Stage 1   []  III --- Stage 2-3 or Stage 2-4   []  IVe Stage 4 Stage 4 ---

## 2015-09-01 NOTE — Patient Instructions (Addendum)
September 2017   Sunday Monday Tuesday Wednesday Thursday Friday Saturday                            1     2       3     4     5     6     7     INJECTION   10:00 AM     FOLLOW UP    10:30 AM     BONE MORROW BIOPSY    11:00 AM 8     9       10     11     12     13     14     INJECTION   10:00 AM     FOLLOW UP    10:30 AM 15     16       17     18     19     20     21     INJECTION   10:30 AM     FOLLOW UP    11:00 AM 22     23       24     25     26     27     28     INJECTION    9:30 AM     FOLLOW UP   10:00 AM 29     30 

## 2015-09-02 ENCOUNTER — Other Ambulatory Visit: Payer: Self-pay | Admitting: Oncology

## 2015-09-02 LAB — CBC AND DIFFERENTIAL
Baso # K/uL: 0 10*3/uL (ref 0.0–0.1)
Basophil %: 0.4 % (ref 0.1–1.2)
Eos # K/uL: 0.3 10*3/uL (ref 0.0–0.4)
Eosinophil %: 5.3 % (ref 0.7–5.8)
Hematocrit: 28.7 % — ABNORMAL LOW (ref 34.1–44.9)
Hemoglobin: 9.6 g/dL — ABNORMAL LOW (ref 11.2–15.7)
Immature Granulocytes Absolute: 0.06 10*3/uL (ref 0.0–0.2)
Immature Granulocytes: 1.1 % (ref 0.0–2.0)
Lymph # K/uL: 0.7 10*3/uL — ABNORMAL LOW (ref 1.2–3.7)
Lymphocyte %: 12.1 % — ABNORMAL LOW (ref 19.3–51.7)
MCH: 29.2 pg (ref 25.6–32.2)
MCHC: 33.4 g/dL (ref 32.2–35.5)
MCV: 87.2 fL (ref 79.4–94.8)
Mono # K/uL: 0.5 10*3/uL (ref 0.2–0.9)
Monocyte %: 9.1 % (ref 4.7–12.5)
Neut # K/uL: 4 10*3/uL (ref 1.6–6.0)
Nucl RBC # K/uL: 0 /100 WBC (ref 0.0–0.2)
Platelets: 200 10*3/uL (ref 182–369)
RBC Distribution Width-SD: 54 fL — ABNORMAL HIGH (ref 36.4–46.3)
RBC: 3.29 10*6/uL — ABNORMAL LOW (ref 3.93–5.22)
RDW: 16.8 % — ABNORMAL HIGH (ref 11.7–14.4)
Seg Neut %: 72 % — ABNORMAL HIGH (ref 34.0–71.1)
WBC: 5.5 10*3/uL (ref 4.0–10.0)

## 2015-09-02 LAB — COMPREHENSIVE METABOLIC PANEL
ALT: 27 U/L (ref 0–35)
AST: 22 U/L (ref 0–35)
Albumin: 4.2 g/dL (ref 3.5–5.2)
Alk Phos: 98 U/L (ref 35–105)
Anion Gap: 15 ^^L (ref 7–16)
Bilirubin,Total: 0.3 mg/dL (ref 0.0–1.2)
CO2: 24 mmol/L (ref 20–28)
Calcium: 9.9 mg/dL (ref 8.6–10.2)
Chloride: 97 mmol/L (ref 96–108)
Creatinine: 1.22 mg/dL — ABNORMAL HIGH (ref 0.51–0.95)
GFR,Black: 55 mL/min/{1.73_m2}
GFR,Caucasian: 46 mL/min/{1.73_m2}
Glucose: 129 mg/dL — ABNORMAL HIGH (ref 60–99)
Lab: 31 mg/dL — ABNORMAL HIGH (ref 6–20)
Potassium: 4.9 mmol/L — ABNORMAL HIGH (ref 3.4–4.7)
Sodium: 136 mmol/L (ref 133–145)
Total Protein: 7.1 g/dL (ref 6.3–7.7)

## 2015-09-02 LAB — MAGNESIUM: Magnesium: 1.8 mg/dL (ref 1.56–2.52)

## 2015-09-02 LAB — LACTATE DEHYDROGENASE: LD: 151 mmol/L (ref 118–225)

## 2015-09-03 LAB — TACROLIMUS LEVEL: Tacrolimus: 7.8 ng/mL

## 2015-09-05 LAB — TACROLIMUS LEVEL: Tacrolimus: 7.8 ng/mL

## 2015-09-06 ENCOUNTER — Other Ambulatory Visit: Payer: Self-pay | Admitting: Hematology

## 2015-09-06 LAB — CBC AND DIFFERENTIAL
Baso # K/uL: 0 10*3/uL (ref 0.0–0.1)
Basophil %: 0.6 % (ref 0.1–1.2)
Eos # K/uL: 0.3 10*3/uL (ref 0.0–0.4)
Eosinophil %: 5 % (ref 0.7–5.8)
Hematocrit: 29.5 % — ABNORMAL LOW (ref 34.1–44.9)
Hemoglobin: 9.7 g/dL — ABNORMAL LOW (ref 11.2–15.7)
Immature Granulocytes Absolute: 0.03 10*3/uL (ref 0.0–0.2)
Immature Granulocytes: 0.6 % (ref 0.0–2.0)
Lymph # K/uL: 0.7 10*3/uL — ABNORMAL LOW (ref 1.2–3.7)
Lymphocyte %: 13 % — ABNORMAL LOW (ref 19.3–51.7)
MCH: 29 pg (ref 25.6–32.2)
MCHC: 32.9 g/dL (ref 32.2–35.5)
MCV: 88.1 fL (ref 79.4–94.8)
Mono # K/uL: 0.4 10*3/uL (ref 0.2–0.9)
Monocyte %: 7.4 % (ref 4.7–12.5)
Neut # K/uL: 3.8 10*3/uL (ref 1.6–6.0)
Nucl RBC # K/uL: 0 /100 WBC (ref 0.0–0.2)
Platelets: 195 10*3/uL (ref 182–369)
RBC Distribution Width-SD: 53.5 fL — ABNORMAL HIGH (ref 36.4–46.3)
RBC: 3.35 10*6/uL — ABNORMAL LOW (ref 3.93–5.22)
RDW: 16.4 % — ABNORMAL HIGH (ref 11.7–14.4)
Seg Neut %: 73.4 % — ABNORMAL HIGH (ref 34.0–71.1)
WBC: 5.2 10*3/uL (ref 4.0–10.0)

## 2015-09-06 LAB — COMPREHENSIVE METABOLIC PANEL
ALT: 30 U/L (ref 0–35)
AST: 26 U/L (ref 0–35)
Albumin: 4.1 mg/dL (ref 3.5–5.2)
Alk Phos: 100 U/L (ref 35–105)
Anion Gap: 15 ^^L (ref 7–16)
Bilirubin,Total: 0.3 mg/dL (ref 0.0–1.2)
CO2: 24 mmol/L (ref 20–28)
Calcium: 9.8 mg/dL (ref 8.6–10.2)
Chloride: 99 mmol/L (ref 96–108)
Creatinine: 1.26 mg/dL — ABNORMAL HIGH (ref 0.51–0.95)
GFR,Black: 53 mL/min/{1.73_m2}
GFR,Caucasian: 44 mL/min/{1.73_m2}
Glucose: 143 mg/dL — ABNORMAL HIGH (ref 60–99)
Lab: 27 mg/dL — ABNORMAL HIGH (ref 6–20)
Potassium: 4.6 mmol/L (ref 3.4–4.7)
Sodium: 138 mmol/L (ref 133–145)
Total Protein: 7.4 g/dL (ref 6.3–7.7)

## 2015-09-06 LAB — MAGNESIUM: Magnesium: 1.8 mg/dL (ref 1.56–2.52)

## 2015-09-06 LAB — LACTATE DEHYDROGENASE: LD: 156 mmol/L (ref 118–225)

## 2015-09-07 ENCOUNTER — Telehealth: Payer: Self-pay | Admitting: Hematology

## 2015-09-07 DIAGNOSIS — Z9481 Bone marrow transplant status: Secondary | ICD-10-CM

## 2015-09-07 DIAGNOSIS — R110 Nausea: Secondary | ICD-10-CM

## 2015-09-07 LAB — TACROLIMUS LEVEL: Tacrolimus: 8.3 ng/mL

## 2015-09-07 MED ORDER — ONDANSETRON HCL 4 MG PO TABS *I*
4.0000 mg | ORAL_TABLET | Freq: Three times a day (TID) | ORAL | 0 refills | Status: DC | PRN
Start: 2015-09-07 — End: 2015-12-29

## 2015-09-07 NOTE — Telephone Encounter (Signed)
Sent!

## 2015-09-08 ENCOUNTER — Ambulatory Visit: Payer: Self-pay

## 2015-09-08 ENCOUNTER — Ambulatory Visit: Payer: Self-pay | Admitting: Hematology

## 2015-09-08 VITALS — BP 167/89 | HR 71 | Temp 97.2°F | Resp 18 | Ht 65.75 in | Wt 232.6 lb

## 2015-09-08 DIAGNOSIS — D471 Chronic myeloproliferative disease: Secondary | ICD-10-CM

## 2015-09-08 DIAGNOSIS — Z9481 Bone marrow transplant status: Secondary | ICD-10-CM

## 2015-09-08 LAB — TYPE AND SCREEN
ABO RH Blood Type: A POS
Antibody Screen: NEGATIVE

## 2015-09-08 LAB — CBC AND DIFFERENTIAL
Baso # K/uL: 0 10*3/uL (ref 0.0–0.1)
Basophil %: 0.4 %
Eos # K/uL: 0.3 10*3/uL (ref 0.0–0.4)
Eosinophil %: 5.5 %
Hematocrit: 30 % — ABNORMAL LOW (ref 34–45)
Hemoglobin: 9.8 g/dL — ABNORMAL LOW (ref 11.2–15.7)
IMM Granulocytes #: 0 10*3/uL (ref 0.0–0.1)
IMM Granulocytes: 0.8 %
Lymph # K/uL: 0.7 10*3/uL — ABNORMAL LOW (ref 1.2–3.7)
Lymphocyte %: 14.4 %
MCH: 29 pg/cell (ref 26–32)
MCHC: 33 g/dL (ref 32–36)
MCV: 88 fL (ref 79–95)
Mono # K/uL: 0.5 10*3/uL (ref 0.2–0.9)
Monocyte %: 10.4 %
Neut # K/uL: 3.2 10*3/uL (ref 1.6–6.1)
Nucl RBC # K/uL: 0 10*3/uL (ref 0.0–0.0)
Nucl RBC %: 0 /100 WBC (ref 0.0–0.2)
Platelets: 185 10*3/uL (ref 160–370)
RBC: 3.4 MIL/uL — ABNORMAL LOW (ref 3.9–5.2)
RDW: 16.3 % — ABNORMAL HIGH (ref 11.7–14.4)
Seg Neut %: 68.5 %
WBC: 4.7 10*3/uL (ref 4.0–10.0)

## 2015-09-08 LAB — COMPREHENSIVE METABOLIC PANEL, PL
ALT, PL: 30 U/L (ref 0–35)
AST, PL: 25 U/L (ref 0–35)
Albumin, PL: 4 g/dL (ref 3.5–5.2)
Alk Phos, PL: 98 U/L (ref 35–105)
Anion Gap,PL: 12 (ref 7–16)
Bilirubin Total, PL: 0.3 mg/dL (ref 0.0–1.2)
CO2,Plasma: 26 mmol/L (ref 20–28)
Calcium, PL: 10.3 mg/dL — ABNORMAL HIGH (ref 8.6–10.2)
Chloride,Plasma: 99 mmol/L (ref 96–108)
Creatinine: 1.48 mg/dL — ABNORMAL HIGH (ref 0.51–0.95)
GFR,Black: 46 * — AB
GFR,Caucasian: 40 * — AB
Glucose,Plasma: 116 mg/dL — ABNORMAL HIGH (ref 60–99)
Potassium,Plasma: 5.1 mmol/L — ABNORMAL HIGH (ref 3.4–4.7)
Sodium,Plasma: 137 mmol/L (ref 133–145)
Total Protein, PL: 7.3 g/dL (ref 6.3–7.7)
UN,Plasma: 31 mg/dL — ABNORMAL HIGH (ref 6–20)

## 2015-09-08 LAB — LD, PL: LD, PL: 162 U/L (ref 118–225)

## 2015-09-08 LAB — MAGNESIUM, PLASMA: Magnesium, PL: 1.5 mEq/L (ref 1.3–2.1)

## 2015-09-08 LAB — TACROLIMUS LEVEL: Tacrolimus: 8.1 ng/mL

## 2015-09-08 LAB — NEUTROPHIL #-INSTRUMENT: Neutrophil #-Instrument: 3.2 10*3/uL

## 2015-09-08 NOTE — Progress Notes (Signed)
Pt arrived for port draw.  IVAD accessed; positive for blood return and flushes well.  Labs drawn.  Port deaccessed.

## 2015-09-08 NOTE — Progress Notes (Signed)
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   She is thinking of returning to work in May and has some questions about social security disability.    No eye, respiratory, GU symptoms.   She has bilateral shoulder pain which she has had for some time.  She does not want to have these X-rayed at present.     Other pertinent findings checked/documented below  _0  fevers  _1  palpitations _2  urinary frequency   _3  chills _4  chest pain _5  dysuria    _6  dry eyes _7  nausea _8  rash   _9  ear pain _10  abdominal cramping _11  skin changes   _12  sores in mouth _13  abdominal discomfort _14  pain   _15  sore throat _16  loose stool, non-formed _17  fatigue   _18  cough  _19  diarrhea, watery _20  enlarged lymph nodes   _21  sputum production  _22  weight loss _23  bleeding   _24  shortness of breath _25  insufficient oral intake _26  none of the above     Medications  Reviewed medications with patient today & electronically reconciled  Current Outpatient Prescriptions on File Prior to Visit   Medication Sig Dispense Refill    ondansetron (ZOFRAN) 4 MG tablet Take 1 tablet (4 mg total) by mouth 3 times daily as needed (nausea) 90 tablet 0    dapsone 100 MG tablet Take 1 tablet (100 mg total) by mouth daily for 30 days 30 tablet 6    biotin 5 MG tablet Take 5 mg by mouth daily      metoprolol (LOPRESSOR) 50 MG tablet Take 1 tablet (50 mg total) by mouth 2 times daily 60 tablet 4    fludrocortisone (FLORINEF) 0.1 mg tablet Take 1 tablet (0.1 mg total) by mouth daily 30 tablet 2     apixaban (ELIQUIS) 5 MG tablet Take 1 tablet (5 mg total) by mouth 2 times daily 60 tablet 11    pantoprazole (PROTONIX) 40 MG EC tablet Take 1 tablet (40 mg total) by mouth 2 times daily   Swallow whole. Do not crush, break, or chew. 60 tablet 5    MAGNESIUM PO Take 2 tablets by mouth 2 times daily         acyclovir (ZOVIRAX) 400 MG tablet Take 1 tablet (400 mg total) by mouth 2 times daily 120 tablet 5    fluconazole (DIFLUCAN) 200 MG tablet Take 1 tablet (200 mg total) by mouth nightly 120 tablet 5    prochlorperazine (COMPAZINE) 10 MG tablet Take 1 tablet (10 mg total) by mouth 4 times daily as needed for Nausea 40 tablet 6    escitalopram (LEXAPRO) 10 MG tablet Take 15 mg by mouth daily      tacrolimus (PROGRAF) 1 MG capsule 1.75m in AM and 264min PM      tacrolimus (PROGRAF) 0.5 MG capsule Take 1 capsule (0.5 mg total) by mouth 2 times daily   And as directed by MD/NP 60 capsule 6     No current facility-administered medications  on file prior to visit.        Recent labs  Reviewed, see below    VS, weight, KPS  BP 167/89   Pulse 71   Temp 36.2 C (97.2 F)   Resp 18   Ht 167 cm (5' 5.75")   Wt 105.5 kg (232 lb 9.6 oz)   SpO2 98%   BMI 37.83 kg/m2    Wt Readings from Last 3 Encounters:   09/08/15 105.5 kg (232 lb 9.6 oz)   09/08/15 105.5 kg (232 lb 9.6 oz)   08/31/15 107 kg (235 lb 14.4 oz)       KPS%: 90    Physical Exam     General Appearance:  Mental status  No acute distress, non-toxic appearing; came with mother  Normal affect, speech, dress, motor activity   Head:  Normocephalic, atraumatic   Eyes:  Conjunctiva/corneas clear, no eye drainage   Oral/Throat: Lips, mucosa, and tongue moist,   no postpharyngeal exudate   Neck: Supple, normal turgor   Heart: S1, S2 normal, regular rhythm, no murmur, rub or gallop   Lungs:   Respirations unlabored, clear to A&P auscultation bilaterally   Abdomen:   Soft, non-tender, bowel sounds active all four quadrants,   tympanic, no masses, no organomegaly    Extremities: No edema   Pulses: 2+ and symmetric   Skin: No rashes or lesions; has several hyperpigmented lesions over anterior abdominal wall and shins; these appear to be chemotherapy mediated.   Shoulder ROM appears intact.      Pea-sized, soft, mobile, non-tender cyst right hip region  Acute GVHD assessment below  Recent Results (from the past 24 hour(s))   Tacrolimus level    Collection Time: 09/08/15 10:52 AM   Result Value Ref Range    Tacrolimus 8.1 ng/mL   Type and screen    Collection Time: 09/08/15 10:52 AM   Result Value Ref Range    ABO RH Blood Type A RH POS     Antibody Screen Negative    CBC and differential    Collection Time: 09/08/15 10:53 AM   Result Value Ref Range    WBC 4.7 4.0 - 10.0 THOU/uL    RBC 3.4 (L) 3.9 - 5.2 MIL/uL    Hemoglobin 9.8 (L) 11.2 - 15.7 g/dL    Hematocrit 30 (L) 34 - 45 %    MCV 88 79 - 95 fL    MCH 29 26 - 32 pg/cell    MCHC 33 32 - 36 g/dL    RDW 16.3 (H) 11.7 - 14.4 %    Platelets 185 160 - 370 THOU/uL    Seg Neut % 68.5 %    Lymphocyte % 14.4 %    Monocyte % 10.4 %    Eosinophil % 5.5 %    Basophil % 0.4 %    Neut # K/uL 3.2 1.6 - 6.1 THOU/uL    Lymph # K/uL 0.7 (L) 1.2 - 3.7 THOU/uL    Mono # K/uL 0.5 0.2 - 0.9 THOU/uL    Eos # K/uL 0.3 0.0 - 0.4 THOU/uL    Baso # K/uL 0.0 0.0 - 0.1 THOU/uL    Nucl RBC % 0.0 0.0 - 0.2 /100 WBC    Nucl RBC # K/uL 0.0 0.0 - 0.0 THOU/uL    IMM Granulocytes # 0.0 0.0 - 0.1 THOU/uL    IMM Granulocytes 0.8 %   Comprehensive Metabolic Panel, PL    Collection Time: 09/08/15 10:53 AM  Result Value Ref Range    Potassium,Plasma 5.1 (H) 3.4 - 4.7 mmol/L    Sodium,Plasma 137 133 - 145 mmol/L    Anion Gap,PL 12 7 - 16    UN,Plasma 31 (H) 6 - 20 mg/dL    Creatinine 1.48 (H) 0.51 - 0.95 mg/dL    GFR,Caucasian 40 (!) *    GFR,Black 46 (!) *    Glucose,Plasma 116 (H) 60 - 99 mg/dL    Calcium, PL 10.3 (H) 8.6 - 10.2 mg/dL    Chloride,Plasma 99 96 - 108 mmol/L    CO2,Plasma 26 20 - 28 mmol/L    Alk Phos, PL 98 35 - 105 U/L    AST, PL 25 0 - 35 U/L     ALT, PL 30 0 - 35 U/L    Albumin, PL 4.0 3.5 - 5.2 g/dL    Bilirubin Total, PL 0.3 0.0 - 1.2 mg/dL    Total Protein, PL 7.3 6.3 - 7.7 g/dL   LD, PL    Collection Time: 09/08/15 10:53 AM   Result Value Ref Range    LD, PL 162 118 - 225 U/L   Magnesium, Plasma    Collection Time: 09/08/15 10:53 AM   Result Value Ref Range    Magnesium, PL 1.5 1.3 - 2.1 mEq/L   Neutrophil #-Instrument    Collection Time: 09/08/15 10:53 AM   Result Value Ref Range    Neutrophil #-Instrument 3.2 THOU/uL       Assessment and Plan  HOORAIN KOZAKIEWICZ is a 55 yo female with unclassifiable myeloproliferative neoplasm and completed Decitabine 58m/m2 IV daily x 5 days - Cycle # 1 12/15/14 and had been on Hydrea prior to transplant. 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     The patient came to clinic today for routine follow up.    Atypical MPN S/P URD allo, day +114  Day 30 marrow (6/6) hypercellular marrow, no evidence of malignancy, chimerism 2% recipient  Next marrow due ~day +100 (along with PFTs & Echo); scheduled on 9/7    Heme  Engrafted  Transfusion independent; remains anemic; this is no doubt multifactorial; will obtain w/u if worsens.  Should improve once tacrolimus is tapered.     At Risk for GVHD  Full dose MTX given day 1,3,6. Day 11 mtx held due to elevated bilirubin and mucositis  Tacro goal 8-12  Tacro dose: 1.5 mg qAM and 2 mg qPM   Tacro level 8.1, no dose change since no GVH s/s and plan to taper after day 100 bmbx soon  No signs of GVHD on exam today    LFTs elevated- monitor    ID  Hx Cholecystitis, pneumonia 05/2015  Immunocompromised  CMV PCR not required both negative    Continue acyclovir and fluconazole ppx  Cont Dapsone 100 mg/day for PCP ppx (unable to tolerate Bactrim due to hyperkalemia)    If IgG <400 or clinical indication, will consider IVIG; she has not needed this.     CV/Pulm  Hx A-flutter  Cont  Metoprolol 50 mg BID  Followed by Dr. MKathi Der Cardiology has placed her on eliquis (will hold eliquis night before and morning of BMBX)  Off cardizem, stopped by Dr. MKathi Der Has oxygen at home, but has not needed it  Possible OSA, needs sleep study at some point soon    Acute KI on CKD r/t tacrolimus  Creatinine 1.48 today (baseline prior to transplant was 0.7)  Encouraged PO intake  Oral magnesium supplement:4 tabs/day    Hyperkalemia r/t medication and acute KI (tacro)  Cont Florinef; potassium still high at 5.1.     GI  Cont protonix twice daily   Zofran prn     Derm  Skin hyperpigmentation related to chemotherapy    Apparent pea sized cyst right hip region; patient will monitor for changes    Fatigue  Not unexpected at this point after BMT, monitor    Smoking cessation  Has been able to abstain thus far    Follow up  BMT clinic: weekly; will obtain shoulder films if pain persists. She will resume zofran.   Bloodwork frequency: twice weekly  Patient's cell phone reception is spotty; Mom's cell is [phone]    BMT [name]  Referring [name]  Lives in [place], [place]

## 2015-09-13 ENCOUNTER — Other Ambulatory Visit: Payer: Self-pay | Admitting: Hematology

## 2015-09-13 LAB — CBC AND DIFFERENTIAL
Baso # K/uL: 0 10*3/uL (ref 0.0–0.1)
Basophil %: 0.4 % (ref 0.1–1.2)
Eos # K/uL: 0.3 10*3/uL (ref 0.0–0.4)
Eosinophil %: 6.3 % — ABNORMAL HIGH (ref 0.7–5.8)
Hematocrit: 30.9 % — ABNORMAL LOW (ref 34.1–44.9)
Hemoglobin: 10 g/dL — ABNORMAL LOW (ref 11.2–15.7)
Immature Granulocytes Absolute: 0.04 10*3/uL (ref 0.0–0.2)
Immature Granulocytes: 0.7 % (ref 0.0–2.0)
Lymph # K/uL: 0.7 10*3/uL — ABNORMAL LOW (ref 1.2–3.7)
Lymphocyte %: 12.3 % — ABNORMAL LOW (ref 19.3–51.7)
MCH: 28.7 pg (ref 25.6–32.2)
MCHC: 32.4 g/dL (ref 32.2–35.5)
MCV: 88.5 fL (ref 79.4–94.8)
Mono # K/uL: 0.5 10*3/uL (ref 0.2–0.9)
Monocyte %: 8.8 % (ref 4.7–12.5)
Neut # K/uL: 3.8 10*3/uL (ref 1.6–6.0)
Nucl RBC # K/uL: 0 /100 WBC (ref 0.0–0.2)
Platelets: 204 10*3/uL (ref 182–369)
RBC Distribution Width-SD: 52.3 fL — ABNORMAL HIGH (ref 36.4–46.3)
RBC: 3.49 10*6/uL — ABNORMAL LOW (ref 3.93–5.22)
RDW: 16.1 % — ABNORMAL HIGH (ref 11.7–14.4)
Seg Neut %: 71.5 % — ABNORMAL HIGH (ref 34.0–71.1)
WBC: 5.4 10*3/uL (ref 4.0–10.0)

## 2015-09-13 LAB — COMPREHENSIVE METABOLIC PANEL
ALT: 28 U/L (ref 0–35)
AST: 24 U/L (ref 0–35)
Albumin: 4.1 g/dL (ref 3.5–5.2)
Alk Phos: 92 U/L (ref 35–105)
Anion Gap: 16 ^^L (ref 7–16)
Bilirubin,Total: 0.4 mg/dL (ref 0.0–1.2)
CO2: 23 mmol/L (ref 20–28)
Calcium: 9.9 mg/dL (ref 8.6–10.2)
Chloride: 101 mmol/L (ref 96–108)
Creatinine: 1.33 mg/dL — ABNORMAL HIGH (ref 0.51–0.95)
GFR,Black: 50 mL/min/{1.73_m2}
GFR,Caucasian: 41 mL/min/{1.73_m2}
Glucose: 120 mg/dL — ABNORMAL HIGH (ref 60–99)
Lab: 27 mg/dL — ABNORMAL HIGH (ref 6–20)
Potassium: 4.8 mmol/L — ABNORMAL HIGH (ref 3.4–4.7)
Sodium: 140 mmol/L (ref 133–145)
Total Protein: 7.2 g/dL (ref 6.3–7.7)

## 2015-09-13 LAB — MAGNESIUM: Magnesium: 1.7 mg/dL (ref 1.56–2.52)

## 2015-09-13 LAB — LACTATE DEHYDROGENASE: LD: 165 mmol/L (ref 118–225)

## 2015-09-14 LAB — TACROLIMUS LEVEL: Tacrolimus: 9.2 ng/mL

## 2015-09-14 LAB — EKG 12-LEAD: Rate: 68 {beats}/min

## 2015-09-15 ENCOUNTER — Ambulatory Visit
Admission: RE | Admit: 2015-09-15 | Discharge: 2015-09-15 | Disposition: A | Discharge status: Home / Self Care | Payer: Self-pay | Source: Ambulatory Visit | Attending: Hematology | Admitting: Hematology

## 2015-09-15 ENCOUNTER — Ambulatory Visit: Payer: Self-pay | Admitting: Oncology

## 2015-09-15 ENCOUNTER — Other Ambulatory Visit: Payer: Self-pay

## 2015-09-15 ENCOUNTER — Encounter: Payer: Self-pay | Admitting: Oncology

## 2015-09-15 ENCOUNTER — Ambulatory Visit: Payer: Self-pay

## 2015-09-15 VITALS — BP 180/105 | HR 79 | Temp 97.5°F | Resp 20 | Ht 65.75 in | Wt 234.8 lb

## 2015-09-15 DIAGNOSIS — D471 Chronic myeloproliferative disease: Secondary | ICD-10-CM

## 2015-09-15 DIAGNOSIS — Z9481 Bone marrow transplant status: Secondary | ICD-10-CM

## 2015-09-15 DIAGNOSIS — R110 Nausea: Secondary | ICD-10-CM

## 2015-09-15 DIAGNOSIS — M25512 Pain in left shoulder: Secondary | ICD-10-CM

## 2015-09-15 DIAGNOSIS — M25511 Pain in right shoulder: Secondary | ICD-10-CM

## 2015-09-15 LAB — CBC AND DIFFERENTIAL
Baso # K/uL: 0 10*3/uL (ref 0.0–0.1)
Basophil %: 0.6 %
Eos # K/uL: 0.3 10*3/uL (ref 0.0–0.4)
Eosinophil %: 5.8 %
Hematocrit: 31 % — ABNORMAL LOW (ref 34–45)
Hemoglobin: 10.2 g/dL — ABNORMAL LOW (ref 11.2–15.7)
IMM Granulocytes #: 0.1 10*3/uL (ref 0.0–0.1)
IMM Granulocytes: 0.9 %
Lymph # K/uL: 0.8 10*3/uL — ABNORMAL LOW (ref 1.2–3.7)
Lymphocyte %: 14.5 %
MCH: 29 pg/cell (ref 26–32)
MCHC: 33 g/dL (ref 32–36)
MCV: 89 fL (ref 79–95)
Mono # K/uL: 0.6 10*3/uL (ref 0.2–0.9)
Monocyte %: 10.8 %
Neut # K/uL: 3.6 10*3/uL (ref 1.6–6.1)
Nucl RBC # K/uL: 0 10*3/uL (ref 0.0–0.0)
Nucl RBC %: 0 /100 WBC (ref 0.0–0.2)
Platelets: 205 10*3/uL (ref 160–370)
RBC: 3.5 MIL/uL — ABNORMAL LOW (ref 3.9–5.2)
RDW: 16.3 % — ABNORMAL HIGH (ref 11.7–14.4)
Seg Neut %: 67.4 %
WBC: 5.4 10*3/uL (ref 4.0–10.0)

## 2015-09-15 LAB — LD, PL: LD, PL: 190 U/L (ref 118–225)

## 2015-09-15 LAB — COMPREHENSIVE METABOLIC PANEL, PL
ALT, PL: 33 U/L (ref 0–35)
AST, PL: 32 U/L (ref 0–35)
Albumin, PL: 4.4 g/dL (ref 3.5–5.2)
Alk Phos, PL: 88 U/L (ref 35–105)
Anion Gap,PL: 12 (ref 7–16)
Bilirubin Total, PL: 0.4 mg/dL (ref 0.0–1.2)
CO2,Plasma: 25 mmol/L (ref 20–28)
Calcium, PL: 10 mg/dL (ref 8.6–10.2)
Chloride,Plasma: 101 mmol/L (ref 96–108)
Creatinine: 1.51 mg/dL — ABNORMAL HIGH (ref 0.51–0.95)
GFR,Black: 44 * — AB
GFR,Caucasian: 39 * — AB
Glucose,Plasma: 109 mg/dL — ABNORMAL HIGH (ref 60–99)
Potassium,Plasma: 4.9 mmol/L — ABNORMAL HIGH (ref 3.4–4.7)
Sodium,Plasma: 138 mmol/L (ref 133–145)
Total Protein, PL: 7.1 g/dL (ref 6.3–7.7)
UN,Plasma: 30 mg/dL — ABNORMAL HIGH (ref 6–20)

## 2015-09-15 LAB — NEUTROPHIL #-INSTRUMENT: Neutrophil #-Instrument: 3.6 10*3/uL

## 2015-09-15 LAB — TACROLIMUS LEVEL: Tacrolimus: 8.1 ng/mL

## 2015-09-15 LAB — MAGNESIUM, PLASMA: Magnesium, PL: 1.4 mEq/L (ref 1.3–2.1)

## 2015-09-15 MED ORDER — BUDESONIDE 3 MG PO CPEP *I*
3.0000 mg | DELAYED_RELEASE_CAPSULE | Freq: Two times a day (BID) | ORAL | 6 refills | Status: DC
Start: 2015-09-15 — End: 2015-12-26

## 2015-09-15 NOTE — Patient Instructions (Signed)
Decrease Tacro to 1.5 BID  Start budesonide  Bilateral shoulder films for bilat shoulder pain worsening   BM bx today   Hold Eliquis tonight (post bx)

## 2015-09-15 NOTE — Progress Notes (Signed)
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    Will be getting films today  Also has ongoing intermittent nausea  Has started using PRN meds more frequently  Will be starting budesonide as a trial if no better consider GI consult  Has lost some weight  Able to keep food and drink down   No rash  No diarrhea  No cough or SON      Other pertinent findings checked/documented below     A comprehensive review of 13 systems was performed; pertinent positives checked below:  _0  fevers  _1  fast pulse _2  decreased appetite  _3  weakness   _4  chills _5  chest pain _6  altered taste  _7  poor mobility    _8  dry eyes _9  nausea _10  insufficient oral intake  _11  impaired ADLs   _12  visual changes  _13  emesis  _14  skin changes _15  insomnia    _16  headache  _17  abdominal cramping  _18  rash  _19  leg edema better   _20  sores in mouth _21  abdominal discomfort _22  urinary frequency _23     _24  sore tongue  _25  diarrhea, watery _26  dysuria and hematuria  _27  anxiety    _28  cough   _29  loose stool, non-formed _30  bleeding  _31  poor coping    _32  sputum production  _33  constipation   _34  pain shoulders  _35  neuropathy   _36  shortness of breath/DOE   _37   weight loss  _38  fatigue better _39  none of the above        Medications  Reviewed medications with patient today & electronically reconciled  Current Outpatient Prescriptions   Medication Sig Note    ondansetron (ZOFRAN) 4 MG tablet Take 1 tablet (4 mg total) by mouth 3 times daily as needed (nausea)     dapsone 100 MG  tablet Take 1 tablet (100 mg total) by mouth daily for 30 days     biotin 5 MG tablet Take 5 mg by mouth daily     tacrolimus (PROGRAF) 1 MG capsule 1.43m in AM and 279min PM 08/25/2015: Received from: External Pharmacy    metoprolol (LOPRESSOR) 50 MG tablet Take 1 tablet (50 mg total) by mouth 2 times daily     fludrocortisone (FLORINEF) 0.1 mg tablet Take 1 tablet (0.1 mg total) by mouth daily     apixaban (ELIQUIS) 5 MG tablet Take 1 tablet (5 mg total) by mouth 2 times daily     pantoprazole (PROTONIX) 40 MG EC tablet Take 1 tablet (40 mg total) by mouth 2 times daily   Swallow whole. Do not crush, break, or chew.     MAGNESIUM PO Take 2 tablets by mouth 2 times daily        acyclovir (ZOVIRAX) 400 MG tablet Take 1 tablet (400 mg total) by mouth 2 times daily     fluconazole (DIFLUCAN) 200 MG tablet Take 1 tablet (200 mg total) by mouth nightly  tacrolimus (PROGRAF) 0.5 MG capsule Take 1 capsule (0.5 mg total) by mouth 2 times daily   And as directed by MD/NP 08/10/2015: Increased 1.5 mg in the morning and 2 mg in the evening    escitalopram (LEXAPRO) 10 MG tablet Take 15 mg by mouth daily     Budesonide (ENTOCORT EC) 3 MG 24 hr capsule Take 1 capsule (3 mg total) by mouth 2 times daily 09/15/2015: To start 9/7     prochlorperazine (COMPAZINE) 10 MG tablet Take 1 tablet (10 mg total) by mouth 4 times daily as needed for Nausea      No current facility-administered medications for this visit.          Recent labs - reviewed    Recent Results (from the past 72 hour(s))   CBC and differential    Collection Time: 09/15/15 10:17 AM   Result Value Ref Range    WBC 5.4 4.0 - 10.0 THOU/uL    RBC 3.5 (L) 3.9 - 5.2 MIL/uL    Hemoglobin 10.2 (L) 11.2 - 15.7 g/dL    Hematocrit 31 (L) 34 - 45 %    MCV 89 79 - 95 fL    MCH 29 26 - 32 pg/cell    MCHC 33 32 - 36 g/dL    RDW 16.3 (H) 11.7 - 14.4 %    Platelets 205 160 - 370 THOU/uL    Seg Neut % 67.4 %    Lymphocyte % 14.5 %    Monocyte % 10.8 %    Eosinophil % 5.8 %     Basophil % 0.6 %    Neut # K/uL 3.6 1.6 - 6.1 THOU/uL    Lymph # K/uL 0.8 (L) 1.2 - 3.7 THOU/uL    Mono # K/uL 0.6 0.2 - 0.9 THOU/uL    Eos # K/uL 0.3 0.0 - 0.4 THOU/uL    Baso # K/uL 0.0 0.0 - 0.1 THOU/uL    Nucl RBC % 0.0 0.0 - 0.2 /100 WBC    Nucl RBC # K/uL 0.0 0.0 - 0.0 THOU/uL    IMM Granulocytes # 0.1 0.0 - 0.1 THOU/uL    IMM Granulocytes 0.9 %   Tacrolimus level    Collection Time: 09/15/15 10:17 AM   Result Value Ref Range    Tacrolimus 8.1 ng/mL   Comprehensive Metabolic Panel, PL    Collection Time: 09/15/15 10:17 AM   Result Value Ref Range    Potassium,Plasma 4.9 (H) 3.4 - 4.7 mmol/L    Sodium,Plasma 138 133 - 145 mmol/L    Anion Gap,PL 12 7 - 16    UN,Plasma 30 (H) 6 - 20 mg/dL    Creatinine 1.51 (H) 0.51 - 0.95 mg/dL    GFR,Caucasian 39 (!) *    GFR,Black 44 (!) *    Glucose,Plasma 109 (H) 60 - 99 mg/dL    Calcium, PL 10.0 8.6 - 10.2 mg/dL    Chloride,Plasma 101 96 - 108 mmol/L    CO2,Plasma 25 20 - 28 mmol/L    Alk Phos, PL 88 35 - 105 U/L    AST, PL 32 0 - 35 U/L    ALT, PL 33 0 - 35 U/L    Albumin, PL 4.4 3.5 - 5.2 g/dL    Bilirubin Total, PL 0.4 0.0 - 1.2 mg/dL    Total Protein, PL 7.1 6.3 - 7.7 g/dL   LD, PL    Collection Time: 09/15/15 10:17 AM   Result Value Ref Range  LD, PL 190 118 - 225 U/L   Neutrophil #-Instrument    Collection Time: 09/15/15 10:17 AM   Result Value Ref Range    Neutrophil #-Instrument 3.6 THOU/uL   Magnesium, Plasma    Collection Time: 09/15/15 10:17 AM   Result Value Ref Range    Magnesium, PL 1.4 1.3 - 2.1 mEq/L         Wt Readings from Last 3 Encounters:   09/15/15 106.5 kg (234 lb 12.8 oz)   09/15/15 106.5 kg (234 lb 12.8 oz)   09/08/15 105.5 kg (232 lb 9.6 oz)     Radiology impressions (last 3 days):  No results found.        Physical Exam   BP (!) 180/105 (BP Location: Left arm)   Pulse 79   Temp 36.4 C (97.5 F)   Resp 20   Ht 167 cm (5' 5.75")   Wt 106.5 kg (234 lb 12.8 oz)   SpO2 95%   BMI 38.19 kg/m2   VSS take in infusion   KPS  70-80%  Pain 0/10   No  acute distress, non-toxic appearing, cooperative with exam   Appropriate affect and dress with normal speech and motor activity  scalp atraumatic, alopecia noted   PERRLA- EOMI. Conjunctiva & corneas clear, no eye drainage. Sclera anicteric OU  Lips, oral mucosa and tongue moist, without lesions; no postpharyngeal exudate Dentition good repair   Neck is supple, no rigidity   Lymph nodes: No cervical, submandibular, supraclavicular, infraclavicular, axillary, or inguinal palpated  Normal S1 & S2, regular, no murmur, rub, or gallop  Respirations unlabored, lung sounds clear to A&P bilateral auscultation  Bowel sounds are normoactive in four quadrants; abdomen is soft, non-tender, and without masses or organomegaly  Radial pulses +2 and symmetric, CMS is intact  trace bilat lower extremity edema  Neuro AOx3, speech/language WNL, moving all extremities, strength/sensation grossly intact. Normal broad based gait. Non-focal exam  Skin warm dry intact no lesions, No rash  Central linesRight mediport NT site clean dry .     Assessment   Pleasant  55 yo female with MPN post allogeneic MUD (F) PBSCT, here for follow up. No clinical signs of infection or GVHD. Shoulder pain worse xrays pending, intermittent nausea starting budesonide if no improvement will consider GI consult for further work up. Tacro dose reduced slightly to 1,5 mg BID.    Plan  MPN post HSCT  Regimen: Fludarabine 40 mg/m2/d and Busulfan 130 mg/m2/d x 4 days   Transplant type: Allogeneic MUD PBSC 10/10 55 yr old female ABO : A+/O\A+. CMV titer:N/N  Transplant day 0:  05/17/15  Transplant day:  + 121  Day 30 marrow 6/6 The hypercellularity and architectural pattern is somewhat unusual, but no definitive evidence of malignancy is seen. Chimerism results indicate only low level (2%) recipient DNA.   Day 100 marrow 09/15/15     Hematology:   Blood Counts stable mild anemia   ANC 3400  No signs of bleeding.  No transfusion  Central line Mediport site clean and dry      GvHD:   Donor MUD (female)  Completed all doses of MTX   Tacrolimus 1.5 mg am 2 mg pm    Goal levels 6-8 as tolerated.  Level today 8.1   Reduce to 1.5 mg BID    No clinical signs of GVHD   Grade 0     ID:  Afebrile   No clinical signs of active infection  Immunocompromised  CMV PCR not required both negative  IgG 825 on 8/23, checking monthly   May require IVIG in future, if <400 or clinical indication, will follow monthly   Cont ppx acyclovir    Cont ppx diflucan  PCP ppx dapsone - consider holding in nausea cont to see if cause    Re-immunizations at 1 yr post BMT as able to give     Cardiovascular/pulmonary:   BP WNL  HR stable   Cont  metoprolol for now   Cardiology follow up as needed    Likely has OSA- will eventually need testing when stronger   H/O pulm edema while IP requiring diuresis   leg edema better today    Doing well with nicotine cravings- using nicotine patches  Refer to smoking cessation clinic as needed   .   Renal/FEN:   Creat elevated but improved 1.77 (2.06)  Cont PO magneium -   Hyperkalemia mild, likely related to Tacro use, controlled with florinef  cont florinef 0.1 mg daily - BP stable   No dysuria or hematuria  Other lytes stable    Gastrointestinal:   Intermittent nausea no emesis   No diarrhea.   Cont protonix   Liver function tests stable WNL  Eating well and drinking fair   Some weight loss noted  Trying budesonide for on going intermittent nausea  May be related to medications or perhaps mild GVHD - following   LFTs  WNL    Endocrine:   Stable.   Eventually bone health    Neurologic:   Nonfocal; no deficits.   No H/A or visual changes.   Monitor for neuro changes on Tacro     Musculoskeletal:   activity as tolerated   Walking more    Able to move back to her home as her ADLs have improved     Derm/eyes:  No eye problems  Alopecia   Skin intact  No rashes       Psychosocial:   Family supportive.   Continue our support.   Marland Kitchen   BMT attending Lytle Butte   BMT coordinator Meyler    PCP Cahnhidalgo  Adjust plan of care as needed.     Follow up  BMT clinic weekly with labs   BMbx results  Started Tacro taper- very mild reduction 0.5 mg in evening dose   Follow up with shoulder films if negative may need additional imaging     Irene Pap, NP

## 2015-09-15 NOTE — Procedures (Signed)
Bone Marrow Aspirate and Biopsy Procedure Note    Carly Higgins is a 55 y.o. female who comes to the [facility] today for a scheduled bone marrow aspirate and biopsy procedure to evaluate MDS and graft status after undergoing an Allogeneic HSCT     Both informed and written consent was obtained after discussing potential risks of the procedure including but not limited to bleeding, infection and pain. All questions were answered satisfactorily and patient verbalizes a good understanding of bone marrow aspirate and biopsy procedure and is willing to have procedure done here today.   Per policy a time out was taken to verify the patient's identity with the spelling of Carly Higgins last name and date of birth.    What Procedure Was Performed: bone marrow aspirate and biopsy.   Correct Procedure: Yes   Correct Patient: (use 2 Identifiers) Yes   Correct Site: Yes right posterior crest   Site marked: Yes   Correct Side: Yes   Correct Patient Position: Yes prone   Consent Verified: Yes   Appropriate Hand Hygiene Used: Yes   Appropriate barrier: fenestrated drape in place entire time  List of Participants Involved in Time-Out process on 09/15/15 11am:  Irene Pap FNP, Camille Bal  (BM tech)     [name] was placed in the prone position and the right posterior iliac crest was prepped with Betadine and sterilely draped. Approximately 15cc 1% lidocaine was used to locally anesthetize the skin and soft tissues directly to the periosteum.    An 8 gauge T-lok bone marrow biopsy needle was introduced into the iliac marrow space and 1 cc of aspirate was obtained and then hand to hematopathology technologist who determined that the aspirate had spicule's present, slides were made. An additional  11 ml of particulate aspirate was obtained after one pass/attempt with out difficulty and sent for flow cytometry, cytogenetics, FISH and chimerism. The bone marrow needle was advanced and a 1.3 cm core bone marrow biopsy  was obtained for surgical pathology, with the first pass     The needle was removed and direct pressure was applied to the site for greater than 5 minutes. The area was then cleaned and a sterile dry pressure dressing was placed over the site with folded 2x2's and elastoplasty tape. Pt had minimal bleeding and discomfort. No other immediate complications.   The patient was advised to leave the dressing on until the following morning and was instructed to refer to the provided, written post-procedure information and/or to call Dr Lytle Butte  or myself with any increased redness, swelling, drainage including bleeding, persistent or worsening pain at the biopsy site, or fever.     The procedure was tolerated well and there were no complications.   Procedure started at approx   11am completed by    11:30am.   Pt stayed approx 20-30 min after procedure to make sure no problems.   Left with family.     Pt has a follow-up appointment with [name]  Or BMT provider next week to discuss these results as they become available     Nurse Practitioner: Irene Pap

## 2015-09-15 NOTE — Progress Notes (Signed)
Outer port accessed and lab work drawn per orders. Deaccessed prior to being discharged to clinic apt.

## 2015-09-16 ENCOUNTER — Encounter: Payer: Self-pay | Admitting: Gastroenterology

## 2015-09-19 LAB — CHIMERISM: Percent Recipient: 2

## 2015-09-19 LAB — CHIM REVIEW

## 2015-09-22 ENCOUNTER — Ambulatory Visit: Payer: Self-pay

## 2015-09-22 ENCOUNTER — Ambulatory Visit: Payer: Self-pay | Admitting: Oncology

## 2015-09-22 VITALS — BP 156/85 | HR 71 | Temp 98.4°F | Resp 18 | Ht 65.75 in | Wt 232.2 lb

## 2015-09-22 DIAGNOSIS — D849 Immunodeficiency, unspecified: Secondary | ICD-10-CM

## 2015-09-22 DIAGNOSIS — H04123 Dry eye syndrome of bilateral lacrimal glands: Secondary | ICD-10-CM

## 2015-09-22 DIAGNOSIS — D471 Chronic myeloproliferative disease: Secondary | ICD-10-CM

## 2015-09-22 DIAGNOSIS — R5383 Other fatigue: Secondary | ICD-10-CM

## 2015-09-22 DIAGNOSIS — Z9481 Bone marrow transplant status: Secondary | ICD-10-CM

## 2015-09-22 LAB — COMPREHENSIVE METABOLIC PANEL, PL
ALT, PL: 31 U/L (ref 0–35)
AST, PL: 28 U/L (ref 0–35)
Albumin, PL: 4.4 g/dL (ref 3.5–5.2)
Alk Phos, PL: 77 U/L (ref 35–105)
Anion Gap,PL: 13 (ref 7–16)
Bilirubin Total, PL: 0.4 mg/dL (ref 0.0–1.2)
CO2,Plasma: 26 mmol/L (ref 20–28)
Calcium, PL: 9.9 mg/dL (ref 8.6–10.2)
Chloride,Plasma: 102 mmol/L (ref 96–108)
Creatinine: 1.15 mg/dL — ABNORMAL HIGH (ref 0.51–0.95)
GFR,Black: 62 *
GFR,Caucasian: 54 * — AB
Glucose,Plasma: 101 mg/dL — ABNORMAL HIGH (ref 60–99)
Potassium,Plasma: 4.6 mmol/L (ref 3.4–4.7)
Sodium,Plasma: 141 mmol/L (ref 133–145)
Total Protein, PL: 7.3 g/dL (ref 6.3–7.7)
UN,Plasma: 26 mg/dL — ABNORMAL HIGH (ref 6–20)

## 2015-09-22 LAB — CBC AND DIFFERENTIAL
Baso # K/uL: 0 10*3/uL (ref 0.0–0.1)
Basophil %: 0.5 %
Eos # K/uL: 0.2 10*3/uL (ref 0.0–0.4)
Eosinophil %: 3.4 %
Hematocrit: 31 % — ABNORMAL LOW (ref 34–45)
Hemoglobin: 10 g/dL — ABNORMAL LOW (ref 11.2–15.7)
IMM Granulocytes #: 0.1 10*3/uL (ref 0.0–0.1)
IMM Granulocytes: 0.8 %
Lymph # K/uL: 1 10*3/uL — ABNORMAL LOW (ref 1.2–3.7)
Lymphocyte %: 15.2 %
MCH: 29 pg/cell (ref 26–32)
MCHC: 32 g/dL (ref 32–36)
MCV: 91 fL (ref 79–95)
Mono # K/uL: 0.7 10*3/uL (ref 0.2–0.9)
Monocyte %: 10.6 %
Neut # K/uL: 4.4 10*3/uL (ref 1.6–6.1)
Nucl RBC # K/uL: 0 10*3/uL (ref 0.0–0.0)
Nucl RBC %: 0 /100 WBC (ref 0.0–0.2)
Platelets: 209 10*3/uL (ref 160–370)
RBC: 3.4 MIL/uL — ABNORMAL LOW (ref 3.9–5.2)
RDW: 16.4 % — ABNORMAL HIGH (ref 11.7–14.4)
Seg Neut %: 69.5 %
WBC: 6.3 10*3/uL (ref 4.0–10.0)

## 2015-09-22 LAB — LD, PL: LD, PL: 175 U/L (ref 118–225)

## 2015-09-22 LAB — TACROLIMUS LEVEL: Tacrolimus: 3.4 ng/mL

## 2015-09-22 LAB — MAGNESIUM, PLASMA: Magnesium, PL: 1.5 mEq/L (ref 1.3–2.1)

## 2015-09-22 LAB — NEUTROPHIL #-INSTRUMENT: Neutrophil #-Instrument: 4.4 10*3/uL

## 2015-09-22 NOTE — Progress Notes (Signed)
Pt inner port of IVAD accessed. Blood return noted, flushing well. Labs obtained and sent per order. Port heparinized per protocol and deaccessed. Discharged to clinic.

## 2015-09-22 NOTE — Progress Notes (Signed)
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    PMH, Oncology History & Problem List  Reviewed    Interval History/Review of Systems  Recent bilat shoulder x-rays showed degenerative changes  Since starting budesonide the nausea, stomach pain has resolved  Formed stool  Dry mouth  Plans to go to [place] in January for 1 month    Other pertinent findings checked/documented below  []  fevers  []  palpitations []  urinary frequency   []  chills []  chest pain []  dysuria    []  dry eyes [x]  nausea []  rash   []  ear pain []  abdominal cramping []  skin changes   []  sores in mouth []  abdominal discomfort []  pain   []  sore throat []  loose stool, non-formed [x]  fatigue   []  cough  []  diarrhea, watery []  enlarged lymph nodes   []  sputum production  []  weight loss []  bleeding   []  shortness of breath [x]  insufficient oral intake []  none of the above     Medications  Reviewed medications with patient today & electronically reconciled  Current Outpatient Prescriptions on File Prior to Visit   Medication Sig Dispense Refill    Budesonide (ENTOCORT EC) 3 MG 24 hr capsule Take 1 capsule (3 mg total) by mouth 2 times daily 60 capsule 6    ondansetron (ZOFRAN) 4 MG tablet Take 1 tablet (4 mg total) by mouth 3 times daily as needed (nausea) 90 tablet 0    dapsone 100 MG tablet Take 1 tablet (100 mg total) by mouth daily for 30 days 30 tablet 6    biotin 5 MG tablet Take 5 mg by mouth daily      tacrolimus (PROGRAF) 1 MG capsule 1.15m in AM and 237min PM      metoprolol (LOPRESSOR) 50 MG tablet Take 1 tablet (50 mg total) by mouth 2 times daily 60 tablet 4    fludrocortisone (FLORINEF) 0.1 mg tablet Take 1 tablet (0.1 mg total) by mouth daily 30 tablet 2    apixaban (ELIQUIS) 5 MG tablet Take 1 tablet (5 mg total) by mouth 2 times daily 60 tablet 11    pantoprazole (PROTONIX) 40 MG EC tablet Take 1 tablet (40 mg total) by mouth 2 times  daily   Swallow whole. Do not crush, break, or chew. 60 tablet 5    MAGNESIUM PO Take 2 tablets by mouth 2 times daily         acyclovir (ZOVIRAX) 400 MG tablet Take 1 tablet (400 mg total) by mouth 2 times daily 120 tablet 5    fluconazole (DIFLUCAN) 200 MG tablet Take 1 tablet (200 mg total) by mouth nightly 120 tablet 5    tacrolimus (PROGRAF) 0.5 MG capsule Take 1 capsule (0.5 mg total) by mouth 2 times daily   And as directed by MD/NP 60 capsule 6    prochlorperazine (COMPAZINE) 10 MG tablet Take 1 tablet (10 mg total) by mouth 4 times daily as needed for Nausea 40 tablet 6    escitalopram (LEXAPRO) 10 MG tablet Take 15 mg by mouth daily       No current facility-administered medications on file prior to visit.        Recent labs  Reviewed, see below    VS, weight, KPS  BP 156/85   Pulse 71   Temp 36.9 C (98.4 F)   Resp 18   Ht 167 cm (5'  5.75")   Wt 105.3 kg (232 lb 3.2 oz)   SpO2 97%   BMI 37.77 kg/m2    Wt Readings from Last 3 Encounters:   09/22/15 105.3 kg (232 lb 3.2 oz)   09/22/15 105.3 kg (232 lb 3.2 oz)   09/15/15 106.5 kg (234 lb 12.8 oz)       KPS%: 90    Physical Exam     General Appearance:  Mental status  No acute distress, non-toxic appearing; came with mother  Normal affect, speech, dress, motor activity   Head:  Normocephalic, atraumatic   Eyes:  Conjunctiva/corneas clear, no eye drainage   Oral/Throat: Lips, mucosa, and tongue moist,   no postpharyngeal exudate   Neck: Supple, normal turgor   Heart: S1, S2 normal, regular rhythm, no murmur, rub or gallop   Lungs:   Respirations unlabored, clear to A&P auscultation bilaterally   Abdomen:   Soft, non-tender, bowel sounds active all four quadrants,   tympanic, no masses, no organomegaly   Extremities: No edema   Pulses: 2+ and symmetric   Skin: No rashes or lesions; has several hyperpigmented lesions over anterior abdominal wall and shins; these appear to be chemotherapy mediated.   Shoulder ROM appears intact.      Pea-sized, soft,  mobile, non-tender cyst right hip region  Acute GVHD assessment below  Recent Results (from the past 24 hour(s))   CBC and differential    Collection Time: 09/22/15 10:33 AM   Result Value Ref Range    WBC 6.3 4.0 - 10.0 THOU/uL    RBC 3.4 (L) 3.9 - 5.2 MIL/uL    Hemoglobin 10.0 (L) 11.2 - 15.7 g/dL    Hematocrit 31 (L) 34 - 45 %    MCV 91 79 - 95 fL    MCH 29 26 - 32 pg/cell    MCHC 32 32 - 36 g/dL    RDW 16.4 (H) 11.7 - 14.4 %    Platelets 209 160 - 370 THOU/uL    Seg Neut % 69.5 %    Lymphocyte % 15.2 %    Monocyte % 10.6 %    Eosinophil % 3.4 %    Basophil % 0.5 %    Neut # K/uL 4.4 1.6 - 6.1 THOU/uL    Lymph # K/uL 1.0 (L) 1.2 - 3.7 THOU/uL    Mono # K/uL 0.7 0.2 - 0.9 THOU/uL    Eos # K/uL 0.2 0.0 - 0.4 THOU/uL    Baso # K/uL 0.0 0.0 - 0.1 THOU/uL    Nucl RBC % 0.0 0.0 - 0.2 /100 WBC    Nucl RBC # K/uL 0.0 0.0 - 0.0 THOU/uL    IMM Granulocytes # 0.1 0.0 - 0.1 THOU/uL    IMM Granulocytes 0.8 %   Tacrolimus level    Collection Time: 09/22/15 10:33 AM   Result Value Ref Range    Tacrolimus 3.4 ng/mL   Comprehensive Metabolic Panel, PL    Collection Time: 09/22/15 10:33 AM   Result Value Ref Range    Potassium,Plasma 4.6 3.4 - 4.7 mmol/L    Sodium,Plasma 141 133 - 145 mmol/L    Anion Gap,PL 13 7 - 16    UN,Plasma 26 (H) 6 - 20 mg/dL    Creatinine 1.15 (H) 0.51 - 0.95 mg/dL    GFR,Caucasian 54 (!) *    GFR,Black 62 *    Glucose,Plasma 101 (H) 60 - 99 mg/dL    Calcium, PL 9.9 8.6 - 10.2  mg/dL    Chloride,Plasma 102 96 - 108 mmol/L    CO2,Plasma 26 20 - 28 mmol/L    Alk Phos, PL 77 35 - 105 U/L    AST, PL 28 0 - 35 U/L    ALT, PL 31 0 - 35 U/L    Albumin, PL 4.4 3.5 - 5.2 g/dL    Bilirubin Total, PL 0.4 0.0 - 1.2 mg/dL    Total Protein, PL 7.3 6.3 - 7.7 g/dL   LD, PL    Collection Time: 09/22/15 10:33 AM   Result Value Ref Range    LD, PL 175 118 - 225 U/L   Magnesium, Plasma    Collection Time: 09/22/15 10:33 AM   Result Value Ref Range    Magnesium, PL 1.5 1.3 - 2.1 mEq/L   Neutrophil #-Instrument    Collection  Time: 09/22/15 10:33 AM   Result Value Ref Range    Neutrophil #-Instrument 4.4 THOU/uL       Assessment and Plan  KONSTANCE HAPPEL is a 55 yo female with unclassifiable myeloproliferative neoplasm and completed Decitabine 40m/m2 IV daily x 5 days - Cycle # 1 12/15/14 and had been on Hydrea prior to transplant. 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     The patient came to clinic today for routine follow up.    Atypical MPN S/P URD allo, day +128  Day 30 marrow (6/6) hypercellular marrow, no evidence of malignancy, chimerism 2% recipient  Day 100 BMbx (09/15/15) Normochromic normocytic anemia. Mild lymphopenia. Mildly hypercellular bone marrow with trilineage hematopoiesis. Chimerism 2% recipient.    Heme  Engrafted  Transfusion independent; remains anemic; this is no doubt multifactorial; will obtain w/u if worsens.  Should improve once tacrolimus is tapered.     GVHD  Full dose MTX given day 1,3,6. Day 11 mtx held due to elevated bilirubin and mucositis  Tacro taper  Tacro dose: 1.5 mg BID since 9/7  Budesonide BID started 9/7    ID  Hx Cholecystitis, pneumonia 05/2015  Immunocompromised  CMV PCR not required both negative    Continue acyclovir and fluconazole ppx  Cont Dapsone 100 mg/day for PCP ppx (unable to tolerate Bactrim due to hyperkalemia)    If IgG <400 or clinical indication, will consider IVIG; she has not needed this.    IgG (8/23) 825    CV/Pulm  Hx A-flutter  Cont Metoprolol 50 mg BID  Followed by Dr. MKathi Der Cardiology  Off cardizem, stopped by Dr. MKathi Der Has oxygen at home, but has not needed it  Possible OSA, needs sleep study at some point soon    Acute KI on CKD r/t tacrolimus- resolving  Creatinine 1.15 today (baseline prior to transplant was 0.7)  Encouraged PO intake  Oral magnesium supplement:4 tabs/day    Hyperkalemia r/t medication and acute KI (tacro)- resolved  Cont  Florinef; potassium 4.6 today    GI  Cont protonix twice daily   Zofran prn     Derm  Skin hyperpigmentation related to chemotherapy    Apparent pea sized cyst right hip region; patient will monitor for changes    Fatigue  Not unexpected at this point after BMT, monitor    Smoking cessation  Has been able to abstain thus far    Follow up  BMT clinic: weekly; will obtain shoulder films if pain persists. She will resume zofran.   Bloodwork frequency:  twice weekly  Patient's cell phone reception is spotty; Mom's cell is [phone]    BMT [name]  Referring [name]  Lives in [place], NP    Clinical Staging & Grading of Acute Graft-versus-host Disease in Adults1  Organ involvement (maximum stage in prior 3 days)    Skin Stage2 Liver Stage Gut Stage   extent of rash using Rule of [place] TB  in mg/dlb   volume of diarrhea in ml/day   [x]  None   [x]  None [x]  None   []  1   BSA <25%   []  1   TB 2-3 []  1   >500 or persistent nauseac   []  2  BSA 25-50%   []  2   TB >3-6 []  2  >1000   []  3   BSA >50%   []  3   TB >6-15 []  3  >1500   []  4   Skin with generalized erythroderma   (diffuse erythema and scaling) & bullous formation []  4   TB >15 []  4   severe abdominal pain with or without ileus     aRule of nine      bDowngrade one stage if an additional cause of elevated bilirubin has been documented.  cPersistent nausea with histologic evidence of GVHD in the stomach or duodenum.     Overall grade    Graded Skin Liver GI   [x]  None  None None None   []  I Stage 1-2 None None   []  II Stage 3 or Stage 1 or Stage 1   []  III --- Stage 2-3 or Stage 2-4   []  IVe Stage 4 Stage 4 ---   dCriteria for grading given as a minimum degree of organ involvement required to confer that grade.  eGrade IV may also include lesser organ involvement with an extreme decrease in performance status.    Acute GVHD diagnostic categories [in the absence of any feature(s) of chronic GVHD]3  []  Classic acute GVHD (before  day +100)  []  Persistent, recurrent, or late acute GVHD (after day +100)   []   Persistent   []   Recurrent   []   Late      References  1Przepiorka et al. (1995). Bone Marrow Transplantation 15, 826.  2Glucksberg et al. [phone]). Transplantation 19, 295-304.  3Filipovich et al. (2005). Biology of Blood and Marrow Transplantation 11, 945-956.

## 2015-09-23 LAB — CHROMOSOME ANALYSIS
Cells Analyzed: 20
Cells Counted: 20
Karyo Made: 2

## 2015-09-23 LAB — CHROMOSOMES REVIEW

## 2015-09-24 LAB — EKG 12-LEAD: Rate: 56 {beats}/min

## 2015-09-29 ENCOUNTER — Ambulatory Visit: Payer: Self-pay

## 2015-09-29 ENCOUNTER — Ambulatory Visit: Payer: Self-pay | Admitting: Hematology

## 2015-09-29 ENCOUNTER — Telehealth: Payer: Self-pay | Admitting: Hematology

## 2015-09-29 DIAGNOSIS — Z9481 Bone marrow transplant status: Secondary | ICD-10-CM

## 2015-09-29 DIAGNOSIS — E875 Hyperkalemia: Secondary | ICD-10-CM

## 2015-09-29 DIAGNOSIS — T50905A Adverse effect of unspecified drugs, medicaments and biological substances, initial encounter: Secondary | ICD-10-CM

## 2015-09-29 DIAGNOSIS — D849 Immunodeficiency, unspecified: Secondary | ICD-10-CM

## 2015-09-29 MED ORDER — DAPSONE 100 MG PO TABS *I*
100.0000 mg | ORAL_TABLET | Freq: Every day | ORAL | 6 refills | Status: DC
Start: 2015-09-29 — End: 2015-12-29

## 2015-09-29 MED ORDER — FLUDROCORTISONE ACETATE 100 MCG PO TABS *I*
0.1000 mg | ORAL_TABLET | Freq: Every day | ORAL | 2 refills | Status: DC
Start: 2015-09-29 — End: 2015-10-06

## 2015-09-29 NOTE — Telephone Encounter (Unsigned)
Scripts for Dapsone and Fludrocortisone sent.   I advised Miamor she should contact the Eliquis prescriber for refills on that script- Felipa Eth NP or her team.  Shely stated understanding.

## 2015-09-30 NOTE — Patient Instructions (Signed)
October 2017   Sunday Monday Tuesday Wednesday Thursday Friday Saturday   1     2     3     4     5     6     7       8     9     10     11     12     INJECTION   10:00 AM     FOLLOW UP    11:00 AM 13     14       15     16     17     18     19     20     21       22     23     24     25     26     INJECTION    9:30 AM     FOLLOW UP   10:00 AM 27     28       29     30     31 

## 2015-10-04 NOTE — Progress Notes (Signed)
[facility] SOCIAL WORK:   [facility] Social Work Therapist, nutritional:     Risk Factors:  Financial    Social Work post assessment plan:  Referral  Referral:     Financial support:  Other    Lexicographer Support:  Information faxed to [facility] as per their request.  Release of information scanned in record.    Time spent on this encounter (in minutes):  [place], LMSW  Senior Social Worker  [employer]  [phone]

## 2015-10-05 ENCOUNTER — Other Ambulatory Visit: Payer: Self-pay | Admitting: Cardiology

## 2015-10-05 DIAGNOSIS — I4891 Unspecified atrial fibrillation: Secondary | ICD-10-CM

## 2015-10-05 MED ORDER — APIXABAN 5 MG PO TABS *I*
5.0000 mg | ORAL_TABLET | Freq: Two times a day (BID) | ORAL | 11 refills | Status: DC
Start: 2015-10-05 — End: 2015-12-15

## 2015-10-05 NOTE — Telephone Encounter (Signed)
Needs refill for Eliquis 5 mg twice daily #60 sent to a different pharmacy:    [facility]

## 2015-10-06 ENCOUNTER — Ambulatory Visit: Payer: Self-pay | Admitting: Oncology

## 2015-10-06 ENCOUNTER — Ambulatory Visit: Payer: Self-pay

## 2015-10-06 ENCOUNTER — Encounter: Payer: Self-pay | Admitting: Oncology

## 2015-10-06 ENCOUNTER — Telehealth: Payer: Self-pay

## 2015-10-06 VITALS — BP 133/87 | HR 78 | Temp 97.7°F | Resp 18 | Ht 65.75 in | Wt 234.1 lb

## 2015-10-06 DIAGNOSIS — D849 Immunodeficiency, unspecified: Secondary | ICD-10-CM

## 2015-10-06 DIAGNOSIS — Z9481 Bone marrow transplant status: Secondary | ICD-10-CM

## 2015-10-06 DIAGNOSIS — R110 Nausea: Secondary | ICD-10-CM

## 2015-10-06 DIAGNOSIS — D471 Chronic myeloproliferative disease: Secondary | ICD-10-CM

## 2015-10-06 LAB — CBC AND DIFFERENTIAL
Baso # K/uL: 0 10*3/uL (ref 0.0–0.1)
Basophil %: 0.6 %
Eos # K/uL: 0.2 10*3/uL (ref 0.0–0.4)
Eosinophil %: 3 %
Hematocrit: 33 % — ABNORMAL LOW (ref 34–45)
Hemoglobin: 10.7 g/dL — ABNORMAL LOW (ref 11.2–15.7)
IMM Granulocytes #: 0.1 10*3/uL (ref 0.0–0.1)
IMM Granulocytes: 1.1 %
Lymph # K/uL: 0.8 10*3/uL — ABNORMAL LOW (ref 1.2–3.7)
Lymphocyte %: 14.9 %
MCH: 29 pg/cell (ref 26–32)
MCHC: 32 g/dL (ref 32–36)
MCV: 91 fL (ref 79–95)
Mono # K/uL: 0.6 10*3/uL (ref 0.2–0.9)
Monocyte %: 10.1 %
Neut # K/uL: 3.8 10*3/uL (ref 1.6–6.1)
Nucl RBC # K/uL: 0 10*3/uL (ref 0.0–0.0)
Nucl RBC %: 0 /100 WBC (ref 0.0–0.2)
Platelets: 197 10*3/uL (ref 160–370)
RBC: 3.7 MIL/uL — ABNORMAL LOW (ref 3.9–5.2)
RDW: 16.2 % — ABNORMAL HIGH (ref 11.7–14.4)
Seg Neut %: 70.3 %
WBC: 5.4 10*3/uL (ref 4.0–10.0)

## 2015-10-06 LAB — COMPREHENSIVE METABOLIC PANEL, PL
ALT, PL: 93 U/L — ABNORMAL HIGH (ref 0–35)
AST, PL: 58 U/L — ABNORMAL HIGH (ref 0–35)
Albumin, PL: 4.2 g/dL (ref 3.5–5.2)
Alk Phos, PL: 99 U/L (ref 35–105)
Anion Gap,PL: 10 (ref 7–16)
Bilirubin Total, PL: 0.6 mg/dL (ref 0.0–1.2)
CO2,Plasma: 27 mmol/L (ref 20–28)
Calcium, PL: 9.6 mg/dL (ref 8.6–10.2)
Chloride,Plasma: 102 mmol/L (ref 96–108)
Creatinine: 0.9 mg/dL (ref 0.51–0.95)
GFR,Black: 83 *
GFR,Caucasian: 72 *
Glucose,Plasma: 89 mg/dL (ref 60–99)
Potassium,Plasma: 4.4 mmol/L (ref 3.4–4.7)
Sodium,Plasma: 139 mmol/L (ref 133–145)
Total Protein, PL: 7 g/dL (ref 6.3–7.7)
UN,Plasma: 19 mg/dL (ref 6–20)

## 2015-10-06 LAB — NEUTROPHIL #-INSTRUMENT: Neutrophil #-Instrument: 3.8 10*3/uL

## 2015-10-06 LAB — MAGNESIUM, PLASMA: Magnesium, PL: 1.5 mEq/L (ref 1.3–2.1)

## 2015-10-06 LAB — LD, PL: LD, PL: 171 U/L (ref 118–225)

## 2015-10-06 NOTE — Progress Notes (Signed)
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    PMH, Oncology History & Problem List  Reviewed and updated as needed    Interval History/Review of Systems  Carly Higgins is feeling well today  ongoing intermittent nausea overall better on budesonide  Finds mild nausea when feeling hungry, the burning lower stomache resolved since starting budesonide  Eating well  No diarrhea  No cough or SOB  No rash  At home, more active     Other pertinent findings checked/documented below     A comprehensive review of 13 systems was performed; pertinent positives checked below:  _0  fevers  _1  fast pulse _2  decreased appetite  _3  weakness   _4  chills _5  chest pain _6  altered taste  _7  poor mobility    _8  dry eyes _9  nausea better  _10  insufficient oral intake  _11  impaired ADLs   _12  visual changes  _13  emesis  _14  skin changes _15  insomnia    _16  headache  _17  abdominal cramping  _18  rash  _19  leg edema better   _20  sores in mouth _21  abdominal discomfort _22  urinary frequency _23     _24  sore tongue  _25  diarrhea, watery _26  dysuria and hematuria  _27  anxiety    _28  cough   _29  loose stool, non-formed _30  bleeding  _31  poor coping    _32  sputum production  _33  constipation   _34  pain gen mild stable _35  neuropathy   _36  shortness of breath/DOE   _37   weight loss  _38  fatigue  _39  none of the above      Patient Active Problem List   Diagnosis Code    MPN (myeloproliferative neoplasm) D47.1    Myeloproliferative disease D47.1    post allogenic MUD (F)  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 Z94.81    Immunocompromised D84.9    Hypomagnesemia E83.42    Hyperkalemia E87.5    Shoulder pain, bilateral M25.511, M25.512    Nausea R11.0       Medications  Reviewed medications with patient today & electronically  reconciled  Current Outpatient Prescriptions   Medication Sig    apixaban (ELIQUIS) 5 MG tablet Take 1 tablet (5 mg total) by mouth 2 times daily    dapsone 100 MG tablet Take 1 tablet (100 mg total) by mouth daily for 30 days    Budesonide (ENTOCORT EC) 3 MG 24 hr capsule Take 1 capsule (3 mg total) by mouth 2 times daily    biotin 5 MG tablet Take 5 mg by mouth daily    tacrolimus (PROGRAF) 1 MG capsule 1 mg   1 mg in AM and 1.5  in PM    metoprolol (LOPRESSOR) 50 MG tablet Take 1 tablet (50 mg total) by mouth 2 times daily    pantoprazole (PROTONIX) 40 MG EC tablet Take 1 tablet (40 mg total) by mouth 2 times daily   Swallow whole. Do not crush, break, or chew.    MAGNESIUM PO Take 2 tablets by mouth 2 times daily       acyclovir (ZOVIRAX) 400 MG tablet Take 1 tablet (400 mg total) by mouth 2 times daily    fluconazole (DIFLUCAN) 200 MG tablet Take 1 tablet (200  mg total) by mouth nightly    tacrolimus (PROGRAF) 0.5 MG capsule Take 1 capsule (0.5 mg total) by mouth 2 times daily   And as directed by MD/NP (Patient taking differently: Take 0.5 mg by mouth every evening   Decreased 9/28 to 1/1,5 from 1.5 BID)    escitalopram (LEXAPRO) 10 MG tablet Take 15 mg by mouth daily    ondansetron (ZOFRAN) 4 MG tablet Take 1 tablet (4 mg total) by mouth 3 times daily as needed (nausea)    prochlorperazine (COMPAZINE) 10 MG tablet Take 1 tablet (10 mg total) by mouth 4 times daily as needed for Nausea     No current facility-administered medications for this visit.          Recent labs - reviewed    Recent Results (from the past 72 hour(s))   CBC and differential    Collection Time: 10/06/15  9:50 AM   Result Value Ref Range    WBC 5.4 4.0 - 10.0 THOU/uL    RBC 3.7 (L) 3.9 - 5.2 MIL/uL    Hemoglobin 10.7 (L) 11.2 - 15.7 g/dL    Hematocrit 33 (L) 34 - 45 %    MCV 91 79 - 95 fL    MCH 29 26 - 32 pg/cell    MCHC 32 32 - 36 g/dL    RDW 16.2 (H) 11.7 - 14.4 %    Platelets 197 160 - 370 THOU/uL    Seg Neut % 70.3 %     Lymphocyte % 14.9 %    Monocyte % 10.1 %    Eosinophil % 3.0 %    Basophil % 0.6 %    Neut # K/uL 3.8 1.6 - 6.1 THOU/uL    Lymph # K/uL 0.8 (L) 1.2 - 3.7 THOU/uL    Mono # K/uL 0.6 0.2 - 0.9 THOU/uL    Eos # K/uL 0.2 0.0 - 0.4 THOU/uL    Baso # K/uL 0.0 0.0 - 0.1 THOU/uL    Nucl RBC % 0.0 0.0 - 0.2 /100 WBC    Nucl RBC # K/uL 0.0 0.0 - 0.0 THOU/uL    IMM Granulocytes # 0.1 0.0 - 0.1 THOU/uL    IMM Granulocytes 1.1 %   Comprehensive Metabolic Panel, PL    Collection Time: 10/06/15  9:50 AM   Result Value Ref Range    Potassium,Plasma 4.4 3.4 - 4.7 mmol/L    Sodium,Plasma 139 133 - 145 mmol/L    Anion Gap,PL 10 7 - 16    UN,Plasma 19 6 - 20 mg/dL    Creatinine 0.90 0.51 - 0.95 mg/dL    GFR,Caucasian 72 *    GFR,Black 83 *    Glucose,Plasma 89 60 - 99 mg/dL    Calcium, PL 9.6 8.6 - 10.2 mg/dL    Chloride,Plasma 102 96 - 108 mmol/L    CO2,Plasma 27 20 - 28 mmol/L    Alk Phos, PL 99 35 - 105 U/L    AST, PL 58 (H) 0 - 35 U/L    ALT, PL 93 (H) 0 - 35 U/L    Albumin, PL 4.2 3.5 - 5.2 g/dL    Bilirubin Total, PL 0.6 0.0 - 1.2 mg/dL    Total Protein, PL 7.0 6.3 - 7.7 g/dL   LD, PL    Collection Time: 10/06/15  9:50 AM   Result Value Ref Range    LD, PL 171 118 - 225 U/L   Magnesium, Plasma    Collection  Time: 10/06/15  9:50 AM   Result Value Ref Range    Magnesium, PL 1.5 1.3 - 2.1 mEq/L   Neutrophil #-Instrument    Collection Time: 10/06/15  9:50 AM   Result Value Ref Range    Neutrophil #-Instrument 3.8 THOU/uL         Wt Readings from Last 3 Encounters:   10/06/15 106.2 kg (234 lb 1.6 oz)   10/06/15 106.2 kg (234 lb 1.6 oz)   09/22/15 105.3 kg (232 lb 3.2 oz)     Radiology impressions (last 3 days):  No results found.        Physical Exam   BP 133/87   Pulse 78   Temp 36.5 C (97.7 F)   Resp 18   Ht 167 cm (5' 5.75")   Wt 106.2 kg (234 lb 1.6 oz)   SpO2 96%   BMI 38.07 kg/m2   KPS  80%  Pain 0/10   No acute distress, non-toxic appearing, cooperative with exam   Appropriate affect and dress with normal speech and motor  activity  scalp atraumatic, alopecia noted   PERRLA- EOMI. Conjunctiva & corneas clear, no eye drainage. Sclera anicteric OU  Lips, oral mucosa and tongue moist, without lesions; no postpharyngeal exudate    Neck is supple, no rigidity   Lymph nodes: No cervical, submandibular, supraclavicular, infraclavicular, axillary, palpated  Normal S1 & S2, regular, no murmur, rub, or gallop  Respirations unlabored, lung sounds clear to A&P bilateral auscultation  Bowel sounds are normoactive in four quadrants; abdomen is soft, non-tender, and without masses or organomegaly  Radial pulses +2 and symmetric, CMS is intact  Trace non pitting  bilat lower extremity edema  Neuro AOx3, speech/language WNL, moving all extremities, strength/sensation grossly intact. Normal gait. Non-focal exam  Skin warm dry intact no lesions, No rash mild hyperpigmentation  Central linesRight mediport NT site clean dry .     Assessment   Pleasant  55 yo female with MPN post allogeneic MUD (F) PBSCT, here for follow up. No clinical signs of infection or GVHD. Shoulder pain improved. Budesonide has helped lower GI buring, still with some nausea when hungry. Tacro dose reduced by 0.5 mg .    Plan  MPN post HSCT  Regimen: Fludarabine 40 mg/m2/d and Busulfan 130 mg/m2/d x 4 days   Transplant type: Allogeneic MUD PBSC 10/10 55 yr old female ABO : A+/O\A+. CMV titer:N/N  Transplant day 0:  05/17/15  Transplant day:  + 142  Day 30 marrow 6/6 The hypercellularity and architectural pattern is somewhat unusual, but no definitive evidence of malignancy is seen. Chimerism results indicate only low level (2%) recipient DNA.   Day 100 marrow 09/15/15 chimerism still 2%, no MDS  1 yr marrow to be scheduled closer to date     Hematology:   Blood Counts stable mild anemia 33%  ANC 3800  PLT WNL  No signs of bleeding.  No transfusion  Central line Mediport site clean and dry     GvHD:   Donor MUD (female)  Completed all doses of MTX   Tacrolimus 1.5 mg BID  Decrease to 1  mg am and 1.5 mg pm     Next taper in approx 4 weeks if no GVHD   No need to check levels on IST taper .  No clinical signs of GVHD   Grade 0     ID:  Afebrile   No clinical signs of active infection  Immunocompromised  CMV PCR  not required both negative  IgG 825 on 8/23, checking monthly   May require IVIG in future, if <400 or clinical indication, will follow monthly   Cont ppx acyclovir    Cont ppx diflucan  PCP ppx dapsone - consider holding in nausea cont, to see if cause    Re-immunizations at 1 yr post BMT as able to give     Cardiovascular/pulmonary:   BP WNL  HR stable   Cont  metoprolol for now   Cardiology follow up as needed    Likely has OSA- will eventually need testing    No leg edema today    Doing well with nicotine cravings- off nicotine patches  Refer to smoking cessation clinic as needed   .   Renal/FEN:   Creat improved with Tacrolimus taper now WNL  Cont PO magneium - reduce to 1 tab tid, cont to decrease with Tacro taper  Hyperkalemia better likely related to Tacro use, controlled with florinef   Tapering Tacro- stop florinef   No dysuria or hematuria  Other lytes stable    Gastrointestinal:   Intermittent nausea no emesis   No diarrhea.   Cont protonix   Liver function tests stable WNL  Eating well and drinking well   budesonide has helped lower abd burning feeling   May be related to medications or perhaps mild GVHD - following   LFTs  Mild transamitis AST and ALT mildly elevated following    Endocrine:   Stable.   Eventually bone health meds and Dexa scan at 1 yr     Neurologic:   Nonfocal; no deficits.   No H/A or visual changes.   Monitor for neuro changes on Tacro   Shoulder pain better with degenerative changes    Musculoskeletal:   activity as tolerated   Walking more and doing more now that she is home  Able to move back to her home as her ADLs have improved   Shoulder pain better   Films degenerative changes    Derm  Alopecia   Skin intact  No rashes    Mild hyperpigmentation  stable     Psychosocial:   Family supportive.   Continue our support.   Happy to be in her own home   .   BMT attending Lytle Butte   BMT coordinator Me[place]   PCP [place]go  Adjust plan of care as needed.     Follow up  BMT clinic weekly with labs   BMbx results  Cont  Tacro taper- reduction 0.5 mg in am dose cont taper every 4 weeks    Carly Pap, NP

## 2015-10-06 NOTE — Progress Notes (Signed)
Pt tolerated port draw well, positive blood return obtained from outer lumen of port and blood work sent for labs. Outer lumen heparinized and de-accessed. Discharged to clinic in stable condition.

## 2015-10-06 NOTE — Telephone Encounter (Signed)
Left VM for Santiago Glad: Per Irene Pap FNP. WE are tapering tacrolimus so she may stop Florinef.  I asked that she call to confirm she got this message.

## 2015-10-11 ENCOUNTER — Other Ambulatory Visit: Payer: Self-pay | Admitting: Oncology

## 2015-10-11 DIAGNOSIS — E875 Hyperkalemia: Secondary | ICD-10-CM

## 2015-10-11 DIAGNOSIS — Z9481 Bone marrow transplant status: Secondary | ICD-10-CM

## 2015-10-11 DIAGNOSIS — T50905A Adverse effect of unspecified drugs, medicaments and biological substances, initial encounter: Secondary | ICD-10-CM

## 2015-10-12 ENCOUNTER — Telehealth: Payer: Self-pay | Admitting: Hematology

## 2015-10-12 NOTE — Telephone Encounter (Signed)
noted 

## 2015-10-12 NOTE — Telephone Encounter (Signed)
Noted  

## 2015-10-14 NOTE — Patient Instructions (Addendum)
October 2017   Sunday Monday Tuesday Wednesday Thursday Friday Saturday   1     2     3     4     5     6     7       8     9     10     11     12   13     14       15     16     17     18     19     20     21       22     23     24     25     26     INJECTION    9:30 AM     FOLLOW UP   10:00 AM 27     28       29     30     31                                    November 2017   Sunday Monday Tuesday Wednesday Thursday Friday Saturday                  1     2     3     4       5     6     7     8     9     INJECTION   10:00 AM     FOLLOW UP    10:30 AM 10     11       12     13     14   ECHO   11:00 AM  Cardiology at [place]     CV HOLTER MONITOR    4:00 PM   Card[facility] at [place] 15     16     17     18       19     20     21     INJECTION    9:30 AM     FOLLOW UP    10:00 AM 22     23    HOLIDAY  24    HOLIDAY  25       26     27     28     29      FOLLOW UP     2:00 PM  [place]   Cardiology at [place]

## 2015-10-17 ENCOUNTER — Telehealth: Payer: Self-pay | Admitting: Hematology

## 2015-10-17 NOTE — Telephone Encounter (Signed)
Patient wondering if it's ok she's using ibuprofen for hip and shoulder pain.  Reviewed with [facility] NP, ok to use, limit dose to 600 mg at a time.  Patient stated understanding.  She has an appointment with her PCP later this week and will review this at this time, per [facility] NP recommendation.  Patient stated understanding.

## 2015-10-20 ENCOUNTER — Ambulatory Visit: Payer: Self-pay | Admitting: Oncology

## 2015-10-20 ENCOUNTER — Encounter: Payer: Self-pay | Admitting: Oncology

## 2015-10-20 ENCOUNTER — Ambulatory Visit: Payer: Self-pay

## 2015-10-20 VITALS — BP 112/81 | HR 88 | Temp 98.2°F | Resp 18 | Ht 65.75 in | Wt 233.5 lb

## 2015-10-20 DIAGNOSIS — D849 Immunodeficiency, unspecified: Secondary | ICD-10-CM

## 2015-10-20 DIAGNOSIS — Z9481 Bone marrow transplant status: Secondary | ICD-10-CM

## 2015-10-20 DIAGNOSIS — M25559 Pain in unspecified hip: Secondary | ICD-10-CM

## 2015-10-20 DIAGNOSIS — D471 Chronic myeloproliferative disease: Secondary | ICD-10-CM

## 2015-10-20 LAB — COMPREHENSIVE METABOLIC PANEL, PL
ALT, PL: 63 U/L — ABNORMAL HIGH (ref 0–35)
AST, PL: 34 U/L (ref 0–35)
Albumin, PL: 4.4 g/dL (ref 3.5–5.2)
Alk Phos, PL: 93 U/L (ref 35–105)
Anion Gap,PL: 13 (ref 7–16)
Bilirubin Total, PL: 0.5 mg/dL (ref 0.0–1.2)
CO2,Plasma: 26 mmol/L (ref 20–28)
Calcium, PL: 9.8 mg/dL (ref 8.6–10.2)
Chloride,Plasma: 98 mmol/L (ref 96–108)
Creatinine: 0.89 mg/dL (ref 0.51–0.95)
GFR,Black: 84 *
GFR,Caucasian: 73 *
Glucose,Plasma: 141 mg/dL — ABNORMAL HIGH (ref 60–99)
Potassium,Plasma: 4.4 mmol/L (ref 3.4–4.7)
Sodium,Plasma: 137 mmol/L (ref 133–145)
Total Protein, PL: 7.4 g/dL (ref 6.3–7.7)
UN,Plasma: 26 mg/dL — ABNORMAL HIGH (ref 6–20)

## 2015-10-20 LAB — TYPE AND SCREEN
ABO RH Blood Type: A POS
Antibody Screen: NEGATIVE

## 2015-10-20 LAB — CBC AND DIFFERENTIAL
Baso # K/uL: 0 10*3/uL (ref 0.0–0.1)
Basophil %: 0.7 %
Eos # K/uL: 0.2 10*3/uL (ref 0.0–0.4)
Eosinophil %: 3 %
Hematocrit: 36 % (ref 34–45)
Hemoglobin: 11.6 g/dL (ref 11.2–15.7)
IMM Granulocytes #: 0.1 10*3/uL (ref 0.0–0.1)
IMM Granulocytes: 1.7 %
Lymph # K/uL: 0.7 10*3/uL — ABNORMAL LOW (ref 1.2–3.7)
Lymphocyte %: 11.6 %
MCH: 30 pg/cell (ref 26–32)
MCHC: 33 g/dL (ref 32–36)
MCV: 92 fL (ref 79–95)
Mono # K/uL: 0.7 10*3/uL (ref 0.2–0.9)
Monocyte %: 11.6 %
Neut # K/uL: 4.3 10*3/uL (ref 1.6–6.1)
Nucl RBC # K/uL: 0 10*3/uL (ref 0.0–0.0)
Nucl RBC %: 0 /100 WBC (ref 0.0–0.2)
Platelets: 211 10*3/uL (ref 160–370)
RBC: 3.9 MIL/uL (ref 3.9–5.2)
RDW: 16.5 % — ABNORMAL HIGH (ref 11.7–14.4)
Seg Neut %: 71.4 %
WBC: 6 10*3/uL (ref 4.0–10.0)

## 2015-10-20 LAB — NEUTROPHIL #-INSTRUMENT: Neutrophil #-Instrument: 4.3 10*3/uL

## 2015-10-20 LAB — MAGNESIUM, PLASMA: Magnesium, PL: 1.6 mEq/L (ref 1.3–2.1)

## 2015-10-20 LAB — IGG: IgG: 828 mg/dL (ref 700–1600)

## 2015-10-20 LAB — LD, PL: LD, PL: 178 U/L (ref 118–225)

## 2015-10-20 NOTE — Progress Notes (Signed)
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 GVHD    PMH, Oncology History & Problem List  Reviewed and updated as needed    Interval History/Review of Systems  Carly Higgins is feeling well today  nausea better   shoulder pain improved after taking ibuprofen   Has bilateral hip pain as well, some improvement after taking ibuprophen  Has arthritis, had pain prior to BMT  Seeing PCP next week for additional managemet  May need to see orthopedics  Busy at home  Planning to go to [place] for a month after X-mas  Will drive down with folks and fly back   Will provide a blood script so can get labs checked while there - closer to date   Aware of need to avoid sun due to GVHD risk  Eating better  No diarrhea  No cough or SOB  No rash    Other pertinent findings checked/documented below     A comprehensive review of 13 systems was performed; pertinent positives checked below:  []  fevers  []  fast pulse []  decreased appetite  []  weakness   []  chills []  chest pain []  altered taste  []  poor mobility    []  dry eyes [x]  nausea better  []  insufficient oral intake  []  impaired ADLs   []  visual changes  []  emesis  [x]  skin changes alopecia []  insomnia    []  headache  []  abdominal cramping  []  rash  []  leg edema better   []  sores in mouth []  abdominal discomfort []  urinary frequency []     []  sore tongue  []  diarrhea, watery []  dysuria and hematuria  []  anxiety    []  cough   []  loose stool, non-formed []  bleeding  []  poor coping    []  sputum production  []  constipation   [x]  pain shoulder/hip joints []  neuropathy   []  shortness of breath/DOE   []   weight loss  []  fatigue  []  none of the above      Patient Active Problem List   Diagnosis Code    MPN (myeloproliferative neoplasm) D47.1     Myeloproliferative disease D47.1    post allogenic MUD (F)  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 Z94.81    Immunocompromised D84.9    Hypomagnesemia E83.42    Hyperkalemia E87.5    Shoulder pain, bilateral M25.511, M25.512    Nausea R11.0       Medications  Reviewed medications with patient today & electronically reconciled  Current Outpatient Prescriptions   Medication Sig    fludrocortisone (FLORINEF) 0.1 mg tablet TAKE 1 TABLET (0.1 MG TOTAL) BY MOUTH DAILY    apixaban (ELIQUIS) 5 MG tablet Take 1 tablet (5 mg total) by mouth 2 times daily    dapsone 100 MG tablet Take 1 tablet (100 mg total) by mouth daily for 30 days    Budesonide (ENTOCORT EC) 3 MG 24 hr capsule Take 1 capsule (3 mg total) by mouth 2 times daily    ondansetron (ZOFRAN) 4 MG tablet Take 1 tablet (4 mg total) by mouth 3 times daily as needed (nausea)    biotin 5 MG tablet Take 5 mg by mouth daily    tacrolimus (PROGRAF) 1 MG capsule 1  mg   1 mg in AM and 1.5  in PM    metoprolol (LOPRESSOR) 50 MG tablet Take 1 tablet (50 mg total) by mouth 2 times daily    pantoprazole (PROTONIX) 40 MG EC tablet Take 1 tablet (40 mg total) by mouth 2 times daily   Swallow whole. Do not crush, break, or chew.    MAGNESIUM PO Take 2 tablets by mouth 2 times daily       acyclovir (ZOVIRAX) 400 MG tablet Take 1 tablet (400 mg total) by mouth 2 times daily    fluconazole (DIFLUCAN) 200 MG tablet Take 1 tablet (200 mg total) by mouth nightly    tacrolimus (PROGRAF) 0.5 MG capsule Take 1 capsule (0.5 mg total) by mouth 2 times daily   And as directed by MD/NP (Patient taking differently: Take 0.5 mg by mouth every evening   Decreased 9/28 to 1/1,5 from 1.5 BID)    prochlorperazine (COMPAZINE) 10 MG tablet Take 1 tablet (10 mg total) by mouth 4 times daily as needed for Nausea    escitalopram (LEXAPRO) 10 MG tablet Take 15 mg by mouth daily     No current facility-administered medications for this visit.          Recent labs - reviewed    Recent Results (from  the past 72 hour(s))   CBC and differential    Collection Time: 10/20/15 10:14 AM   Result Value Ref Range    WBC 6.0 4.0 - 10.0 THOU/uL    RBC 3.9 3.9 - 5.2 MIL/uL    Hemoglobin 11.6 11.2 - 15.7 g/dL    Hematocrit 36 34 - 45 %    MCV 92 79 - 95 fL    MCH 30 26 - 32 pg/cell    MCHC 33 32 - 36 g/dL    RDW 16.5 (H) 11.7 - 14.4 %    Platelets 211 160 - 370 THOU/uL    Seg Neut % 71.4 %    Lymphocyte % 11.6 %    Monocyte % 11.6 %    Eosinophil % 3.0 %    Basophil % 0.7 %    Neut # K/uL 4.3 1.6 - 6.1 THOU/uL    Lymph # K/uL 0.7 (L) 1.2 - 3.7 THOU/uL    Mono # K/uL 0.7 0.2 - 0.9 THOU/uL    Eos # K/uL 0.2 0.0 - 0.4 THOU/uL    Baso # K/uL 0.0 0.0 - 0.1 THOU/uL    Nucl RBC % 0.0 0.0 - 0.2 /100 WBC    Nucl RBC # K/uL 0.0 0.0 - 0.0 THOU/uL    IMM Granulocytes # 0.1 0.0 - 0.1 THOU/uL    IMM Granulocytes 1.7 %   IgG    Collection Time: 10/20/15 10:14 AM   Result Value Ref Range    IgG 828 700 - 1600 mg/dL   Type and screen    Collection Time: 10/20/15 10:14 AM   Result Value Ref Range    ABO RH Blood Type A RH POS     Antibody Screen Negative    Comprehensive Metabolic Panel, PL    Collection Time: 10/20/15 10:14 AM   Result Value Ref Range    Potassium,Plasma 4.4 3.4 - 4.7 mmol/L    Sodium,Plasma 137 133 - 145 mmol/L    Anion Gap,PL 13 7 - 16    UN,Plasma 26 (H) 6 - 20 mg/dL    Creatinine 0.89 0.51 - 0.95 mg/dL    GFR,Caucasian 73 *  GFR,Black 84 *    Glucose,Plasma 141 (H) 60 - 99 mg/dL    Calcium, PL 9.8 8.6 - 10.2 mg/dL    Chloride,Plasma 98 96 - 108 mmol/L    CO2,Plasma 26 20 - 28 mmol/L    Alk Phos, PL 93 35 - 105 U/L    AST, PL 34 0 - 35 U/L    ALT, PL 63 (H) 0 - 35 U/L    Albumin, PL 4.4 3.5 - 5.2 g/dL    Bilirubin Total, PL 0.5 0.0 - 1.2 mg/dL    Total Protein, PL 7.4 6.3 - 7.7 g/dL   LD, PL    Collection Time: 10/20/15 10:14 AM   Result Value Ref Range    LD, PL 178 118 - 225 U/L   Magnesium, Plasma    Collection Time: 10/20/15 10:14 AM   Result Value Ref Range    Magnesium, PL 1.6 1.3 - 2.1 mEq/L   Neutrophil  #-Instrument    Collection Time: 10/20/15 10:14 AM   Result Value Ref Range    Neutrophil #-Instrument 4.3 THOU/uL         Wt Readings from Last 3 Encounters:   10/20/15 105.9 kg (233 lb 8 oz)   10/20/15 105.9 kg (233 lb 8 oz)   10/06/15 106.2 kg (234 lb 1.6 oz)     Radiology impressions (last 3 days):  No results found.        Physical Exam   BP 112/81   Pulse 88   Temp 36.8 C (98.2 F)   Resp 18   Ht 167 cm (5' 5.75")   Wt 105.9 kg (233 lb 8 oz)   BMI 37.98 kg/m2   KPS  80%  Pain 0/10   No acute distress, non-toxic appearing, cooperative with exam   Appropriate affect and dress with normal speech and motor activity  scalp atraumatic, hair growing back   PERRLA- EOMI. Conjunctiva & corneas clear, no eye drainage. Sclera anicteric OU  Lips, oral mucosa and tongue moist, without lesions; no postpharyngeal exudate    Neck is supple, no rigidity   Lymph nodes: No cervical, submandibular, supraclavicular, infraclavicular, axillary, palpated  Normal S1 & S2, regular, no murmur, rub, or gallop  Respirations unlabored, lung sounds clear to A&P bilateral auscultation  Bowel sounds are normoactive in four quadrants; abdomen is soft, non-tender, and without masses or organomegaly  Radial pulses +2 and symmetric, CMS is intact  Trace non pitting  bilat lower extremity edema, limited mobility with shoulder hip pain   Neuro AOx3, speech/language WNL, moving all extremities, pain noted in hips.  Non-focal exam  Skin warm dry intact no lesions, No rash mild hyperpigmentation  Central linesRight mediport NT site clean dry .     Assessment   Pleasant  55 yo female with MPN post allogeneic MUD (F) PBSCT, here for follow up. No clinical signs of infection or GVHD. Shoulder pain improved after ibuprofen . Hip pain flared as well. To see PCP for joint pain next week. Tacro dose reduced today by 0.5 mg, dose now 1 mg BID  .    Plan  MPN post HSCT  Regimen: Fludarabine 40 mg/m2/d and Busulfan 130 mg/m2/d x 4 days   Transplant type:  Allogeneic MUD PBSC 10/10 54 yr old female ABO : A+/O\A+. CMV titer:N/N  Transplant day 0:  05/17/15  Transplant day:  + 156  Day 30 marrow 6/6 The hypercellularity and architectural pattern is somewhat unusual, but no definitive evidence of  malignancy is seen. Chimerism results indicate only low level (2%) recipient DNA.   Day 100 marrow 09/15/15 chimerism still 2%, no MDS  1 yr marrow to be scheduled closer to date     Hematology:   Blood Counts stable   ANC 4300  PLT WNL  No signs of bleeding.  No transfusion  Central line Mediport site clean and dry   Would like to get mediport out - shared we may be able to after taper IST    GvHD:   Donor MUD (female)  Completed all doses of MTX   Tacrolimus 1 mg am and 1.5 mg pm     Decrease to 1 mg BID on 10/12  Next taper in approx 4 weeks if no GVHD   No need to check levels on IST taper .  No clinical signs of GVHD   Grade 0     ID:  Afebrile   No clinical signs of active infection  Immunocompromised  CMV PCR not required both negative  IgG >800 this month    May require IVIG in future, if <400 or clinical indication, will follow monthly   Cont ppx acyclovir    Cont ppx diflucan  PCP ppx dapsone     Re-immunizations at 1 yr post BMT as able to give     Cardiovascular/pulmonary:   BP WNL  HR stable   Cont  metoprolol for now   Cardiology follow up as needed    Likely has OSA- will eventually need testing    No leg edema     Doing well with nicotine cravings- off nicotine patches  Refer to smoking cessation clinic as needed   .   Renal/FEN:   Creat improved with Tacrolimus taper now WNL  Cont PO magneium - reduce to 1 tab bid, cont to decrease with Tacro taper, level good  Hyperkalemia better with taper   Tapering Tacro- off florinef late Sept - levels good  No dysuria or hematuria  Other lytes stable    Gastrointestinal:   Nausea better has PRN medications as needed, not using    No diarrhea.   Cont protonix   Eating well and drinking well   budesonide has helped lower abd  burning feeling , atper as able   May be related to medications or perhaps mild GVHD - following - better   LFTs  Mild transamitis ALT mildly elevated following- beter and AST now normal     Endocrine:   Stable.   Eventually bone health meds and Dexa scan at 1 yr     Neurologic:   Nonfocal; no deficits.   No H/A or visual changes.   Monitor for neuro changes on Tacro   Shoulder pain  with degenerative changes ibuprofen helpful  Hip pain as well to see PCP for additional management    Musculoskeletal:   activity as tolerated   Walking more and doing more now that she is home, may explain incease joint pain   Shoulder pain cont   Hip pain flare  Films degenerative changes    Derm  Alopecia better   Skin intact  No rashes    Mild hyperpigmentation stable     Psychosocial:   Family supportive.   Continue our support.   Traveling to [place] late December for 1 month   .   BMT attending [facility]   BMT coordinator Meyler   PCP [place]  Adjust plan of care as needed.     Follow up  BMT clinic every other week with labs   Cont  Tacro taper- reduction 0.5 mg in am dose cont taper every 4 weeks    Irene Pap, NP

## 2015-10-20 NOTE — Progress Notes (Signed)
Pt inner port of IVAD accessed, tolerated well. Labs drawn and sent. Port flushed, heparinized and deaccessed. Pt discharged to clinic, next appointment set

## 2015-10-22 DIAGNOSIS — M25552 Pain in left hip: Secondary | ICD-10-CM | POA: Insufficient documentation

## 2015-10-22 DIAGNOSIS — M25551 Pain in right hip: Secondary | ICD-10-CM | POA: Insufficient documentation

## 2015-10-27 NOTE — Patient Instructions (Addendum)
November 2017   Sunday Monday Tuesday Wednesday Thursday Friday Saturday                  1     2  Port removal  [address]     3     4       5     6     7     8     9    Labs prior to appointment      FOLLOW UP   10:30 AM 10     11       12     13     14      ECHO   11:00 AM  Cardiology at [place]    HOLTER MONITOR    4:00 PM  Cardiology at [place] 15     16     17     18       19     20     21    Labs prior to appointment      FOLLOW UP    10:00 AM 22     23     24     25       26     27     28     29      FOLLOW UP    2:00 PM  Cardiology at [facility] Flud[facility]  You will need to stop Anticoagulation for a number of days before the port removal- follow instructions by IR.    PORT REMOVAL  Nothing to eat or drink 6 hours prior  Will need a driver

## 2015-11-03 ENCOUNTER — Ambulatory Visit: Payer: Self-pay

## 2015-11-03 ENCOUNTER — Ambulatory Visit: Payer: Self-pay | Admitting: Hematology

## 2015-11-03 VITALS — BP 126/78 | HR 76 | Temp 98.4°F | Resp 18 | Ht 65.75 in | Wt 233.3 lb

## 2015-11-03 DIAGNOSIS — Z9481 Bone marrow transplant status: Secondary | ICD-10-CM

## 2015-11-03 DIAGNOSIS — D471 Chronic myeloproliferative disease: Secondary | ICD-10-CM

## 2015-11-03 LAB — CBC AND DIFFERENTIAL
Baso # K/uL: 0 10*3/uL (ref 0.0–0.1)
Basophil %: 0.7 %
Eos # K/uL: 0.2 10*3/uL (ref 0.0–0.4)
Eosinophil %: 4 %
Hematocrit: 34 % (ref 34–45)
Hemoglobin: 10.9 g/dL — ABNORMAL LOW (ref 11.2–15.7)
IMM Granulocytes #: 0.1 10*3/uL (ref 0.0–0.1)
IMM Granulocytes: 1.1 %
Lymph # K/uL: 0.8 10*3/uL — ABNORMAL LOW (ref 1.2–3.7)
Lymphocyte %: 15.1 %
MCH: 30 pg/cell (ref 26–32)
MCHC: 32 g/dL (ref 32–36)
MCV: 94 fL (ref 79–95)
Mono # K/uL: 0.5 10*3/uL (ref 0.2–0.9)
Monocyte %: 9.4 %
Neut # K/uL: 3.9 10*3/uL (ref 1.6–6.1)
Nucl RBC # K/uL: 0 10*3/uL (ref 0.0–0.0)
Nucl RBC %: 0 /100 WBC (ref 0.0–0.2)
Platelets: 198 10*3/uL (ref 160–370)
RBC: 3.6 MIL/uL — ABNORMAL LOW (ref 3.9–5.2)
RDW: 15.9 % — ABNORMAL HIGH (ref 11.7–14.4)
Seg Neut %: 69.7 %
WBC: 5.6 10*3/uL (ref 4.0–10.0)

## 2015-11-03 LAB — LIPID PANEL
Chol/HDL Ratio: 3.3
Cholesterol: 194 mg/dL
HDL: 58 mg/dL
LDL Calculated: 116 mg/dL
Non HDL Cholesterol: 136 mg/dL
Triglycerides: 100 mg/dL

## 2015-11-03 LAB — COMPREHENSIVE METABOLIC PANEL, PL
ALT, PL: 69 U/L — ABNORMAL HIGH (ref 0–35)
AST, PL: 35 U/L (ref 0–35)
Albumin, PL: 4.2 g/dL (ref 3.5–5.2)
Alk Phos, PL: 88 U/L (ref 35–105)
Anion Gap,PL: 9 (ref 7–16)
Bilirubin Total, PL: 0.5 mg/dL (ref 0.0–1.2)
CO2,Plasma: 28 mmol/L (ref 20–28)
Calcium, PL: 9.7 mg/dL (ref 8.6–10.2)
Chloride,Plasma: 99 mmol/L (ref 96–108)
Creatinine: 0.94 mg/dL (ref 0.51–0.95)
GFR,Black: 79 *
GFR,Caucasian: 68 *
Glucose,Plasma: 120 mg/dL — ABNORMAL HIGH (ref 60–99)
Potassium,Plasma: 4.6 mmol/L (ref 3.4–4.7)
Sodium,Plasma: 136 mmol/L (ref 133–145)
Total Protein, PL: 7.2 g/dL (ref 6.3–7.7)
UN,Plasma: 23 mg/dL — ABNORMAL HIGH (ref 6–20)

## 2015-11-03 LAB — LD, PL: LD, PL: 188 U/L (ref 118–225)

## 2015-11-03 LAB — NEUTROPHIL #-INSTRUMENT: Neutrophil #-Instrument: 3.9 10*3/uL

## 2015-11-03 LAB — MAGNESIUM, PLASMA: Magnesium, PL: 1.6 mEq/L (ref 1.3–2.1)

## 2015-11-03 NOTE — Progress Notes (Signed)
Pt tolerated port draw well, positive blood return obtained from inner port of IVAD, and blood work sent for labs. Port heparinized and de-accessed. Discharged to clinic in stable condition.

## 2015-11-03 NOTE — Progress Notes (Signed)
[facility] Progress Note    No chief complaint on file.    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    shoulder pain improved after taking ibuprofen; now the pain has migrated to her right hip and low back agai.    The aches come and go as related to arthritis she has had for some time.   Preparing for her parents to move back to [facility]  Will provide a blood script so can get labs checked while there - closer to date   Aware of need to avoid sun due to GVHD risk  Eating better  No diarrhea  No cough or SOB  No rash  No eye or mouth difficulties  Targeting work return one year after transplant.   She would like to have port out.     Other pertinent findings checked/documented below     A comprehensive review of 13 systems was performed; pertinent positives checked below:  _0  fevers  _1  fast pulse _2  decreased appetite  _3  weakness   _4  chills _5  chest pain _6  altered taste  _7  poor mobility    _8  dry eyes _9  nausea better  _10  insufficient oral intake  _11  impaired ADLs   _12  visual changes  _13  emesis  _14  skin changes alopecia _15  insomnia    _16  headache  _17  abdominal cramping  _18  rash  _19  leg edema better   _20  sores in mouth _21  abdominal discomfort _22  urinary frequency _23     _24  sore tongue  _25  diarrhea, watery _26  dysuria and hematuria  _27  anxiety    _28  cough   _29  loose stool, non-formed _30  bleeding  _31  poor coping    _32  sputum production  _33  constipation   _34  pain shoulder/hip joints _35  neuropathy   _36  shortness of breath/DOE   _37   weight loss  _38  fatigue  _39  none of the above      Patient Active Problem List   Diagnosis Code    MPN (myeloproliferative  neoplasm) D47.1    Myeloproliferative disease D47.1    post allogenic MUD (F)  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 Z94.81    Immunocompromised D84.9    Hypomagnesemia E83.42    Hyperkalemia E87.5    Shoulder pain, bilateral M25.511, M25.512    Nausea R11.0    Hip pain M25.559       Medications  Reviewed medications with patient today & electronically reconciled  Current Outpatient Prescriptions   Medication Sig Note    ibuprofen (ADVIL,MOTRIN) 200 MG tablet Take 600 mg by mouth daily     apixaban (ELIQUIS) 5 MG tablet Take 1 tablet (5 mg total) by mouth 2 times daily     dapsone 100 MG tablet Take 1 tablet (100 mg total) by mouth daily for 30 days     Budesonide (ENTOCORT EC) 3 MG 24 hr capsule Take 1 capsule (3 mg total) by mouth 2 times daily     ondansetron (ZOFRAN) 4 MG tablet Take 1 tablet (4 mg total) by mouth 3 times daily as needed (nausea)     biotin 5 MG tablet Take 5 mg  by mouth daily     tacrolimus (PROGRAF) 1 MG capsule 1 mg   1 mg in AM and 1.5  in PM 11/03/2015: 1 mg twice daily.    metoprolol (LOPRESSOR) 50 MG tablet Take 1 tablet (50 mg total) by mouth 2 times daily     pantoprazole (PROTONIX) 40 MG EC tablet Take 1 tablet (40 mg total) by mouth 2 times daily   Swallow whole. Do not crush, break, or chew.     MAGNESIUM PO Take 2 tablets by mouth 2 times daily        acyclovir (ZOVIRAX) 400 MG tablet Take 1 tablet (400 mg total) by mouth 2 times daily     fluconazole (DIFLUCAN) 200 MG tablet Take 1 tablet (200 mg total) by mouth nightly     tacrolimus (PROGRAF) 0.5 MG capsule Take 1 capsule (0.5 mg total) by mouth 2 times daily   And as directed by MD/NP (Patient taking differently: Take 1 mg by mouth 2 times daily   )     escitalopram (LEXAPRO) 10 MG tablet Take 15 mg by mouth daily     prochlorperazine (COMPAZINE) 10 MG tablet Take 1 tablet (10 mg total) by mouth 4 times daily as needed for Nausea      No current facility-administered medications for this visit.          Recent labs -  reviewed    Recent Results (from the past 72 hour(s))   CBC and differential    Collection Time: 11/03/15  9:49 AM   Result Value Ref Range    WBC 5.6 4.0 - 10.0 THOU/uL    RBC 3.6 (L) 3.9 - 5.2 MIL/uL    Hemoglobin 10.9 (L) 11.2 - 15.7 g/dL    Hematocrit 34 34 - 45 %    MCV 94 79 - 95 fL    MCH 30 26 - 32 pg/cell    MCHC 32 32 - 36 g/dL    RDW 15.9 (H) 11.7 - 14.4 %    Platelets 198 160 - 370 THOU/uL    Seg Neut % 69.7 %    Lymphocyte % 15.1 %    Monocyte % 9.4 %    Eosinophil % 4.0 %    Basophil % 0.7 %    Neut # K/uL 3.9 1.6 - 6.1 THOU/uL    Lymph # K/uL 0.8 (L) 1.2 - 3.7 THOU/uL    Mono # K/uL 0.5 0.2 - 0.9 THOU/uL    Eos # K/uL 0.2 0.0 - 0.4 THOU/uL    Baso # K/uL 0.0 0.0 - 0.1 THOU/uL    Nucl RBC % 0.0 0.0 - 0.2 /100 WBC    Nucl RBC # K/uL 0.0 0.0 - 0.0 THOU/uL    IMM Granulocytes # 0.1 0.0 - 0.1 THOU/uL    IMM Granulocytes 1.1 %   Comprehensive Metabolic Panel, PL    Collection Time: 11/03/15  9:49 AM   Result Value Ref Range    Potassium,Plasma 4.6 3.4 - 4.7 mmol/L    Sodium,Plasma 136 133 - 145 mmol/L    Anion Gap,PL 9 7 - 16    UN,Plasma 23 (H) 6 - 20 mg/dL    Creatinine 0.94 0.51 - 0.95 mg/dL    GFR,Caucasian 68 *    GFR,Black 79 *    Glucose,Plasma 120 (H) 60 - 99 mg/dL    Calcium, PL 9.7 8.6 - 10.2 mg/dL    Chloride,Plasma 99 96 - 108 mmol/L  CO2,Plasma 28 20 - 28 mmol/L    Alk Phos, PL 88 35 - 105 U/L    AST, PL 35 0 - 35 U/L    ALT, PL 69 (H) 0 - 35 U/L    Albumin, PL 4.2 3.5 - 5.2 g/dL    Bilirubin Total, PL 0.5 0.0 - 1.2 mg/dL    Total Protein, PL 7.2 6.3 - 7.7 g/dL   LD, PL    Collection Time: 11/03/15  9:49 AM   Result Value Ref Range    LD, PL 188 118 - 225 U/L   Magnesium, Plasma    Collection Time: 11/03/15  9:49 AM   Result Value Ref Range    Magnesium, PL 1.6 1.3 - 2.1 mEq/L   Neutrophil #-Instrument    Collection Time: 11/03/15  9:49 AM   Result Value Ref Range    Neutrophil #-Instrument 3.9 THOU/uL   Lipid panel    Collection Time: 11/03/15  9:49 AM   Result Value Ref Range    Cholesterol  194 mg/dL    Triglycerides 100 mg/dL    HDL 58 mg/dL    LDL Calculated 116 mg/dL    Non HDL Cholesterol 136 mg/dL    Chol/HDL Ratio 3.3          Wt Readings from Last 3 Encounters:   11/03/15 105.8 kg (233 lb 4.8 oz)   11/03/15 105.8 kg (233 lb 4.8 oz)   10/20/15 105.9 kg (233 lb 8 oz)     Radiology impressions (last 3 days):  No results found.    Physical Exam   BP 126/78   Pulse 76   Temp 36.9 C (98.4 F)   Resp 18   Ht 167 cm (5' 5.75")   Wt 105.8 kg (233 lb 4.8 oz)   SpO2 95%   BMI 37.94 kg/m2   KPS  80%  Pain 0/10   No acute distress, llooks well and well-groomed.   Appropriate affect and dress with normal speech and motor activity  scalp atraumatic, hair growing back   PERRLA- EOMI. Conjunctiva & corneas clear, no eye drainage. Sclera anicteric OU  Lips, oral mucosa and tongue moist, without lesions; no postpharyngeal exudate    Shoulder ROM without pain  Lymph nodes: No cervical, submandibular, supraclavicular, infraclavicular, axillary, palpated  Normal S1 & S2, regular, no murmur, rub, or gallop  Respirations unlabored, lung sounds clear to A&P bilateral auscultation  Bowel sounds are normoactive in four quadrants; abdomen is soft, non-tender, and without masses or organomegaly  Trace non pitting  bilat lower extremity edema,; tender to palpation over right posterior pelvic brim  Neuro AOx3, speech/language WNL, moving all extremities, pain noted in hips.  Non-focal exam  Skin warm dry intact no lesions, No rash mild hyperpigmentation  Central linesRight mediport NT site clean and  dry .     Assessment   Pleasant  55 yo female with MPN post allogeneic MUD (F) PBSCT, here for follow up. No clinical signs of infection or GVHD. Shoulder pain improved after ibuprofen . Hip pain flared as well. Tacro dose reduced today by 0.5 mg, dose now 1 mg and 1.0  .    Plan  MPN post HSCT  Regimen: Fludarabine 40 mg/m2/d and Busulfan 130 mg/m2/d x 4 days   Transplant type: Allogeneic MUD PBSC 10/10 55 yr old female ABO :  A+/O\A+. CMV titer:N/N  Transplant day 0:  05/17/15  Transplant day:  + 170  Day 30 marrow 6/6 The hypercellularity  and architectural pattern is somewhat unusual, but no definitive evidence of malignancy is seen. Chimerism results indicate only low level (2%) recipient DNA.   Day 100 marrow 09/15/15 chimerism still 2%, no MDS  1 yr marrow to be scheduled closer to date     Hematology:   Blood Counts stable   PLT WNL  No signs of bleeding.  No transfusion  Central line Mediport site clean and dry   Would like to get mediport out - shared we may be able to after taper IST; we will start arranging this although we told her we often leave it in until  Year post-SCT.     GvHD:   Donor MUD (female)  Completed all doses of MTX   Tacrolimus 1 mg am and 1.0 mg pm     Decrease to 1 mg BID on 10/12; will leave dose as at present .  Next taper in approx 4 weeks if no GVHD   No need to check levels on IST taper .  No clinical signs of GVHD   Grade 0     ID:  Afebrile   No clinical signs of active infection  Immunocompromised  CMV PCR not required both negative  IgG >800 this month    May require IVIG in future, if <400 or clinical indication, will follow monthly   Cont ppx acyclovir    Cont ppx diflucan  PCP ppx dapsone     Re-immunizations at 1 yr post BMT as able to give     Cardiovascular/pulmonary:   BP WNL  HR stable   Cont  metoprolol for now   Cardiology follow up as needed    Likely has OSA- will eventually need testing    No leg edema     Doing well with nicotine cravings- off nicotine patches  Refer to smoking cessation clinic as needed   ON ELIQUIS FOR A-FIB PER CARDIOLOGY  .   Renal/FEN:   Creat improved with Tacrolimus taper now WNL  Cont PO magneium - reduce to 1 tab bid, cont to decrease with Tacro taper, level good  Hyperkalemia better with taper; can stop fluorinef   No dysuria or hematuria  Other lytes stable    Gastrointestinal:   Nausea better has PRN medications as needed, not using    No diarrhea.   Cont  protonix and zofran as needed .  Eating well and drinking well   budesonide has helped lower abd burning feeling , atper as able   May be related to medications (NSAIDS) or perhaps mild GVHD - following - better   LFTs  Mild transamitis ALT mildly elevated following- beter and AST now normal     Endocrine:   Stable.   Eventually bone health meds and Dexa scan at 1 yr     Neurologic:   Nonfocal; no deficits.   Shoulder pain  with degenerative changes ibuprofen helpful  Hip pain as well to see PCP for additional management    Musculoskeletal:   activity as tolerated   Walking more and doing more now that she is home, may explain incease joint pain   Shoulder pain cont   Hip pain flare  Films degenerative changes    Derm  Alopecia better   Skin intact  No rashes    Mild hyperpigmentation stable     Psychosocial:   Family supportive.   Continue our support.   Traveling to [place] late December for 1 month   .   BMT  attending Lytle [place]   BMT coordinator Meyler   PCP [place] plan of care as needed.     Follow up  BMT clinic every other week with labs; if she has port out, this will require holding eliquis for a few days and possilby the ibuprofen, also.   Cont  Tacro taper- reduction 0.5 mg in am dose cont taper every 4 weeks    Cheron Schaumann, MD

## 2015-11-04 ENCOUNTER — Telehealth: Payer: Self-pay

## 2015-11-04 LAB — HEMOGLOBIN A1C: Hemoglobin A1C: 4.4 % (ref 4.0–6.0)

## 2015-11-04 NOTE — Telephone Encounter (Signed)
Left voice message for pt to confirm 0945 appointment scheduled at [facility] on 11/10/15. Provided [facility] address and phone number and asked that pt return call to review pre procedure details.

## 2015-11-07 ENCOUNTER — Telehealth: Payer: Self-pay

## 2015-11-07 DIAGNOSIS — Z01812 Encounter for preprocedural laboratory examination: Secondary | ICD-10-CM

## 2015-11-07 LAB — VITAMIN D
25-OH VIT D2: <4 ng/mL
25-OH VIT D3: 21 ng/mL
25-OH Vit Total: 21 ng/mL — ABNORMAL LOW (ref 30–60)

## 2015-11-07 NOTE — Telephone Encounter (Signed)
Pt returned phone call to confirm appointment to have port removed on 11/02 at [facility]. Pt stated she is holding Eliquis and will be having INR drawn prior to this appointment.

## 2015-11-08 ENCOUNTER — Other Ambulatory Visit
Admission: RE | Admit: 2015-11-08 | Discharge: 2015-11-08 | Disposition: A | Discharge status: Home / Self Care | Payer: Self-pay | Source: Ambulatory Visit

## 2015-11-08 DIAGNOSIS — Z9481 Bone marrow transplant status: Secondary | ICD-10-CM

## 2015-11-08 DIAGNOSIS — D471 Chronic myeloproliferative disease: Secondary | ICD-10-CM

## 2015-11-08 DIAGNOSIS — Z01812 Encounter for preprocedural laboratory examination: Secondary | ICD-10-CM

## 2015-11-08 LAB — CBC AND DIFFERENTIAL
Baso # K/uL: 0 10*3/uL (ref 0.0–0.1)
Basophil %: 0.4 %
Eos # K/uL: 0.1 10*3/uL (ref 0.0–0.4)
Eosinophil %: 2.6 %
Hematocrit: 34 % (ref 34–45)
Hemoglobin: 11.1 g/dL — ABNORMAL LOW (ref 11.2–15.7)
IMM Granulocytes #: 0.1 10*3/uL (ref 0.0–0.1)
IMM Granulocytes: 1.5 %
Lymph # K/uL: 0.7 10*3/uL — ABNORMAL LOW (ref 1.2–3.7)
Lymphocyte %: 13.3 %
MCH: 30 pg/cell (ref 26–32)
MCHC: 33 g/dL (ref 32–36)
MCV: 93 fL (ref 79–95)
Mono # K/uL: 0.5 10*3/uL (ref 0.2–0.9)
Monocyte %: 10.1 %
Neut # K/uL: 3.9 10*3/uL (ref 1.6–6.1)
Nucl RBC # K/uL: 0 10*3/uL (ref 0.0–0.0)
Nucl RBC %: 0 /100 WBC (ref 0.0–0.2)
Platelets: 172 10*3/uL (ref 160–370)
RBC: 3.7 MIL/uL — ABNORMAL LOW (ref 3.9–5.2)
RDW: 15.9 % — ABNORMAL HIGH (ref 11.7–14.4)
Seg Neut %: 72.1 %
WBC: 5.3 10*3/uL (ref 4.0–10.0)

## 2015-11-08 LAB — COMPREHENSIVE METABOLIC PANEL, PL
ALT, PL: 139 U/L — ABNORMAL HIGH (ref 0–35)
AST, PL: 84 U/L — ABNORMAL HIGH (ref 0–35)
Albumin, PL: 4.2 g/dL (ref 3.5–5.2)
Alk Phos, PL: 98 U/L (ref 35–105)
Anion Gap,PL: 11 (ref 7–16)
Bilirubin Total, PL: 0.5 mg/dL (ref 0.0–1.2)
CO2,Plasma: 28 mmol/L (ref 20–28)
Calcium, PL: 9.8 mg/dL (ref 8.6–10.2)
Chloride,Plasma: 99 mmol/L (ref 96–108)
Creatinine: 0.99 mg/dL — ABNORMAL HIGH (ref 0.51–0.95)
GFR,Black: 74 *
GFR,Caucasian: 64 *
Glucose,Plasma: 109 mg/dL — ABNORMAL HIGH (ref 60–99)
Potassium,Plasma: 5.1 mmol/L — ABNORMAL HIGH (ref 3.4–4.7)
Sodium,Plasma: 138 mmol/L (ref 133–145)
Total Protein, PL: 7 g/dL (ref 6.3–7.7)
UN,Plasma: 29 mg/dL — ABNORMAL HIGH (ref 6–20)

## 2015-11-08 LAB — PROTIME-INR
INR: 1 (ref 0.9–1.1)
Protime: 10.6 s (ref 10.0–12.9)

## 2015-11-08 LAB — MULTIPLE ORDERING DOCS

## 2015-11-08 LAB — LD, PL: LD, PL: 217 U/L (ref 118–225)

## 2015-11-08 LAB — MAGNESIUM, PLASMA: Magnesium, PL: 1.6 mEq/L (ref 1.3–2.1)

## 2015-11-08 LAB — NEUTROPHIL #-INSTRUMENT: Neutrophil #-Instrument: 3.9 10*3/uL

## 2015-11-08 LAB — TYPE AND SCREEN
ABO RH Blood Type: A POS
Antibody Screen: NEGATIVE

## 2015-11-09 NOTE — Patient Instructions (Addendum)
November 2017   Sunday Monday Tuesday Wednesday Thursday Friday Saturday                  1     2     3     4       5     6     7     8     9    Labs prior to appointment      FOLLOW UP   10:30 AM 10     11       12     13     14      ECHO   11:00 AM  Cardiology at [place]    HOLTER MONITOR    4:00 PM  Cardiology at [place] 15     16     17     18       19     20     21    Labs prior to appointment      FOLLOW UP    10:00 AM 22     23     24     25       26     27     28     29      FOLLOW UP    2:00 PM  Cardiology at [place]

## 2015-11-10 ENCOUNTER — Ambulatory Visit
Admission: RE | Admit: 2015-11-10 | Discharge: 2015-11-10 | Disposition: A | Discharge status: Home / Self Care | Payer: Self-pay | Source: Ambulatory Visit | Attending: Hematology | Admitting: Hematology

## 2015-11-10 ENCOUNTER — Encounter: Payer: Self-pay | Admitting: Hematology

## 2015-11-10 MED ORDER — ONDANSETRON HCL 4 MG PO TABS *I*
4.0000 mg | ORAL_TABLET | Freq: Once | ORAL | Status: AC
Start: 2015-11-10 — End: 2015-11-10
  Administered 2015-11-10: 4 mg via ORAL

## 2015-11-10 MED ORDER — FENTANYL CITRATE 50 MCG/ML IJ SOLN *WRAPPED*
INTRAMUSCULAR | Status: AC | PRN
Start: 2015-11-10 — End: 2015-11-10
  Administered 2015-11-10: 25 ug via INTRAVENOUS
  Administered 2015-11-10: 50 ug via INTRAVENOUS

## 2015-11-10 MED ORDER — MIDAZOLAM HCL 1 MG/ML IJ SOLN *I* WRAPPED
INTRAMUSCULAR | Status: AC | PRN
Start: 2015-11-10 — End: 2015-11-10
  Administered 2015-11-10: 2 mg via INTRAVENOUS
  Administered 2015-11-10: 1 mg via INTRAVENOUS

## 2015-11-10 NOTE — Preop H&P (Signed)
OUTPATIENT  Chief Complaint: MediPort removal.    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    Conditioned w/ Busulfan 130 mg/m2/d and Fludarabine 40 mg/m2/d x4 days followed by 10/10 HLA MUD (F) Allogeneic PBSCT (pt/donor: ABO: A+/A+, CMV N/N)    Pulmonary edema     Shortness of breath      Past Surgical History:   Procedure Laterality Date    ROTATOR CUFF REPAIR Right      Family History   Problem Relation Age of Onset    No Known Problems Mother     No Known Problems Father     No Known Problems Sister     No Known Problems Brother      Social History     Social History    Marital status: Divorced     Spouse name: N/A    Number of children: N/A    Years of education: N/A     Social History Main Topics    Smoking status: Former Smoker     Packs/day: 0.50     Years: 30.00     Quit date: 05/11/2015    Smokeless tobacco: Never Used      Comment: nicoderm patches started 05/11/15    Alcohol use No      Comment: rare     Drug use: No    Sexual activity: Not Currently     Other Topics Concern    None     Social History Narrative       Allergies: No Known Allergies (drug, envir, food or latex)    Current Outpatient Prescriptions   Medication    ibuprofen (ADVIL,MOTRIN) 200 MG tablet    apixaban (ELIQUIS) 5 MG tablet    Budesonide (ENTOCORT EC) 3 MG 24 hr capsule    ondansetron (ZOFRAN) 4 MG tablet    biotin 5 MG tablet    tacrolimus (PROGRAF) 1 MG capsule    metoprolol (LOPRESSOR) 50 MG tablet    pantoprazole (PROTONIX) 40 MG EC tablet    MAGNESIUM PO    acyclovir (ZOVIRAX) 400 MG tablet    fluconazole (DIFLUCAN) 200 MG tablet    tacrolimus (PROGRAF) 0.5 MG capsule     prochlorperazine (COMPAZINE) 10 MG tablet    escitalopram (LEXAPRO) 10 MG tablet    dapsone 100 MG tablet     Current Facility-Administered Medications   Medication Dose Route Frequency    ondansetron (ZOFRAN) tablet 4 mg  4 mg Oral Once        Review of Systems:   ROS    Last Nursing documented pain:  0-10 Scale: 0 (11/10/15 1018)      Patient Vitals for the past 24 hrs:   BP Temp Temp src Pulse Resp SpO2 Height Weight   11/10/15 1018 119/68 36.2 C (97.2 F) TEMPORAL 67 17 98 % 169 cm (5' 6.53") 104.3 kg (230 lb)            Physical Exam    Lab Results:   All labs in the last 72 hours:  Recent  Results (from the past 72 hour(s))   Multiple ordering docs    Collection Time: 11/08/15  7:08 AM   Result Value Ref Range    Mult Providers see below    Protime-INR    Collection Time: 11/08/15  7:13 AM   Result Value Ref Range    Protime 10.6 10.0 - 12.9 sec    INR 1.0 0.9 - 1.1   CBC and differential    Collection Time: 11/08/15  7:13 AM   Result Value Ref Range    WBC 5.3 4.0 - 10.0 THOU/uL    RBC 3.7 (L) 3.9 - 5.2 MIL/uL    Hemoglobin 11.1 (L) 11.2 - 15.7 g/dL    Hematocrit 34 34 - 45 %    MCV 93 79 - 95 fL    MCH 30 26 - 32 pg/cell    MCHC 33 32 - 36 g/dL    RDW 15.9 (H) 11.7 - 14.4 %    Platelets 172 160 - 370 THOU/uL    Seg Neut % 72.1 %    Lymphocyte % 13.3 %    Monocyte % 10.1 %    Eosinophil % 2.6 %    Basophil % 0.4 %    Neut # K/uL 3.9 1.6 - 6.1 THOU/uL    Lymph # K/uL 0.7 (L) 1.2 - 3.7 THOU/uL    Mono # K/uL 0.5 0.2 - 0.9 THOU/uL    Eos # K/uL 0.1 0.0 - 0.4 THOU/uL    Baso # K/uL 0.0 0.0 - 0.1 THOU/uL    Nucl RBC % 0.0 0.0 - 0.2 /100 WBC    Nucl RBC # K/uL 0.0 0.0 - 0.0 THOU/uL    IMM Granulocytes # 0.1 0.0 - 0.1 THOU/uL    IMM Granulocytes 1.5 %   Type and screen    Collection Time: 11/08/15  7:13 AM   Result Value Ref Range    ABO RH Blood Type A RH POS     Antibody Screen Negative    Comprehensive Metabolic Panel, PL    Collection Time: 11/08/15  7:13 AM   Result Value Ref Range    Potassium,Plasma  5.1 (H) 3.4 - 4.7 mmol/L    Sodium,Plasma 138 133 - 145 mmol/L    Anion Gap,PL 11 7 - 16    UN,Plasma 29 (H) 6 - 20 mg/dL    Creatinine 0.99 (H) 0.51 - 0.95 mg/dL    GFR,Caucasian 64 *    GFR,Black 74 *    Glucose,Plasma 109 (H) 60 - 99 mg/dL    Calcium, PL 9.8 8.6 - 10.2 mg/dL    Chloride,Plasma 99 96 - 108 mmol/L    CO2,Plasma 28 20 - 28 mmol/L    Alk Phos, PL 98 35 - 105 U/L    AST, PL 84 (H) 0 - 35 U/L    ALT, PL 139 (H) 0 - 35 U/L    Albumin, PL 4.2 3.5 - 5.2 g/dL    Bilirubin Total, PL 0.5 0.0 - 1.2 mg/dL    Total Protein, PL 7.0 6.3 - 7.7 g/dL   LD, PL    Collection Time: 11/08/15  7:13 AM   Result Value Ref Range    LD, PL 217 118 - 225 U/L   Magnesium, Plasma    Collection Time: 11/08/15  7:13 AM   Result Value Ref Range    Magnesium, PL 1.6 1.3 - 2.1 mEq/L   Neutrophil #-Instrument    Collection Time: 11/08/15  7:13  AM   Result Value Ref Range    Neutrophil #-Instrument 3.9 THOU/uL       Radiology impressions (last 3 days):  No results found.    Currently Active/Followed Hospital Problems:  There are no active hospital problems to display for this patient.      Assessment: Patient was nauseated, felt better after Zofran. No other acute complaints.    Plan: Ok to proceed with MediPort removal.    Author: Eber Hong Judd Mccubbin, MD  Note created: 11/10/2015  at: 11:18 AM

## 2015-11-10 NOTE — Progress Notes (Signed)
[employer] Nursing Procedure Note    Carly Higgins  [id number]    Procedure: medi-port removal.        Status: Completed    Patient tolerated procedure well     Specimen Collection: no    Sponge count: Yes. Pre- Procedure Count: 10.  Post- Procedure Count: 10.    Fluid Removed:N/A  Procedure Dressing Site located:Right IJ  Dressing Type:sterile gauze/tegaderm  Biopatch:no  Dressing status:Clean, dry and intact   Hematoma:Not evident  Medication received:Versed 3mg  and Fentanyl 4mcg  Cardiovascular:   Peripheral Pulses: Present post procedure      Fistula: N/A    Neuro Assessment:Patient is at pre-procedure baseline    Implant patient information given to patient or parent/guardian:N/A    Report given [id number] recovery nurse. Name: Nellie-RN      Last Filed Vitals    11/10/15 1140   BP: 119/67   Pulse: 61   Resp: 18   Temp:    SpO2: 100%

## 2015-11-10 NOTE — Procedures (Signed)
Procedure Report    PROCEDURE NOTE    Time out documentation completed in Procedure navigator prior to procedure:  Yes    Indications  Mediport removal.    Procedure Details  Successful mediport removal.    Complications  None.    Condition  good    Plan/Orders  Followup with oncology.    EBL: Minimal.    Specimens  N/A.    Disposition  Pt to return to recovery area.    Eber Hong Johncarlo Maalouf, MD  11/10/2015  11:48 AM              Moderate Sedation Face Times  Start Time: [id number]  End Time: 1150  Duration (minutes): 32 Minutes

## 2015-11-10 NOTE — Discharge Instructions (Signed)
Interventional Radiology Mediport Remove Discharge Instructions    11/10/2015             12:10 PM     Provider performing test/procedure:Dr. Mart Piggs    Patient given Medication: Yes. You have been given medicine, Versed and Fentanyl, that may make you sleepy. Do not drive, operate  heavy machinery, drink alcoholic beverages, make important personal or business decisions, or sign legal documents until the next day.    [x] Impla nt card given to patient N/A    PATIENT INSTRUCTIONS   After the surgery, the incision will be covered with a gauze dressing. This dressing may be removed after 48 hours.      Under the dressing you will find several thin paper tapes (steri-strips) covering the incision. The steri-strips should be left in place.  The stitches are underneath the skin and will dissolve.      You may shower in 48 hours once the dressing is removed.     Do not scrub the incision. Simply let the water run over it and then gently pat it dry.       Do not submerge the incision for 1 week (no baths, hot tubs or swimming pools.)     If the port is accessed (a needle in place) at the time of placement, do not remove the needle. This will be done by the cancer/infusion center. It must be removed within 3 days.  Once the needle is removed, you may follow the above instructions.     You may resume your regular diet with your doctors permission.     You may resume all your medications as directed by your doctor, including blood thinners.    COMFORT MEASURES   For the first few days, it is common for the area around the incision to be swollen, discolored (black & blue), and sore. To help reduce swelling, apply an ice pack to the swollen area for 15 to 20 minutes every hour for 3 days or more as needed.      Wear loose, comfortable clothing.      We recommend the use of over-the-counter anti-inflammatory medication such as ibuprofen (Advil, Motrin, Aleve), if not allergic,  to minimize swelling, inflammation, and  pain. Take medication every 4 - 6 hours with food.      ACTIVITY   Lifting with arm on side of mediport should be restricted to 10-15 pounds or less for 2 weeks.    Avoid pushing, pulling, straining, or any strenuous activity.    You may climb stairs.   You may drive if not using narcotic pain medication and if you are comfortable enough behind the wheel and can safely operate the vehicle.    RETURN TO WORK   You may return to work, when you feel you are ready at any point after surgery if otherwise cleared by your doctor.    WHEN TO CALL THE OFFICE - Do not hesitate to call the office if you develop a fever  (temperature greater than 101), shaking chills, bleeding, increasing redness or drainage around the site.    Monday - Friday 8am-5pm: For questions or concerns regarding your procedure please call [facility] ([facility] [phone].    After hours/Weekends:  [facility] patients may call [phone] and ask to speak with the Radiology resident on call.   [facility] patients may call [phone] to speak with the [place] or call [phone] and ask to  speak with the Radiology resident on call.    The Emergency Department is open 24 hours a day at [facility] if emergency treatment is required.

## 2015-11-10 NOTE — Progress Notes (Signed)
Patient present to [facility] SP for medi-port remove.Port remove successfully,no complications.right upper chest area covered with dry,sterill dressing.Patient discharged home,Alert and oriented x 3,vss stable,no pain.Given all discharge instructions and answered questions with good understanding.Thanks.

## 2015-11-16 LAB — SURGICAL PATHOLOGY

## 2015-11-16 NOTE — Progress Notes (Signed)
BMT SOCIAL WORK NOTE    Contacts:  Carly Higgins    SW received a telephone call from the patient.  She has paperwork for LTD, and questions about SSD.  She will be in clinic tomorrow and would like assistance with these letters/documents.  She will bring them with her to the appointment.      [facility] SOCIAL WORK:   [facility] Social Work Therapist, nutritional:     Social Work contact made?: Yes      Method of contact:  Telephone    Risk Factors:  Financial    Social Work post assessment plan:  As needed follow-up  Referral:     Time spent on this encounter (in minutes):  Carly Higgins, Carly Higgins

## 2015-11-17 ENCOUNTER — Encounter: Payer: Self-pay | Admitting: Oncology

## 2015-11-17 ENCOUNTER — Ambulatory Visit: Payer: Self-pay

## 2015-11-17 ENCOUNTER — Ambulatory Visit: Payer: Self-pay | Admitting: Oncology

## 2015-11-17 ENCOUNTER — Other Ambulatory Visit
Admission: RE | Admit: 2015-11-17 | Discharge: 2015-11-17 | Disposition: A | Discharge status: Home / Self Care | Payer: Self-pay | Source: Ambulatory Visit | Attending: Oncology | Admitting: Oncology

## 2015-11-17 VITALS — BP 122/59 | HR 69 | Temp 96.1°F | Resp 18 | Ht 65.75 in | Wt 239.6 lb

## 2015-11-17 DIAGNOSIS — D471 Chronic myeloproliferative disease: Secondary | ICD-10-CM

## 2015-11-17 DIAGNOSIS — Z9481 Bone marrow transplant status: Secondary | ICD-10-CM

## 2015-11-17 DIAGNOSIS — D849 Immunodeficiency, unspecified: Secondary | ICD-10-CM

## 2015-11-17 LAB — CBC AND DIFFERENTIAL
Baso # K/uL: 0 10*3/uL (ref 0.0–0.1)
Basophil %: 0.8 %
Eos # K/uL: 0.3 10*3/uL (ref 0.0–0.4)
Eosinophil %: 5.5 %
Hematocrit: 35 % (ref 34–45)
Hemoglobin: 10.9 g/dL — ABNORMAL LOW (ref 11.2–15.7)
IMM Granulocytes #: 0.1 10*3/uL (ref 0.0–0.1)
IMM Granulocytes: 2.1 %
Lymph # K/uL: 0.7 10*3/uL — ABNORMAL LOW (ref 1.2–3.7)
Lymphocyte %: 13.5 %
MCH: 31 pg/cell (ref 26–32)
MCHC: 32 g/dL (ref 32–36)
MCV: 97 fL — ABNORMAL HIGH (ref 79–95)
Mono # K/uL: 0.7 10*3/uL (ref 0.2–0.9)
Monocyte %: 13 %
Neut # K/uL: 3.4 10*3/uL (ref 1.6–6.1)
Nucl RBC # K/uL: 0 10*3/uL (ref 0.0–0.0)
Nucl RBC %: 0 /100 WBC (ref 0.0–0.2)
Platelets: 188 10*3/uL (ref 160–370)
RBC: 3.6 MIL/uL — ABNORMAL LOW (ref 3.9–5.2)
RDW: 15.8 % — ABNORMAL HIGH (ref 11.7–14.4)
Seg Neut %: 65.1 %
WBC: 5.3 10*3/uL (ref 4.0–10.0)

## 2015-11-17 LAB — TYPE AND SCREEN
ABO RH Blood Type: A POS
Antibody Screen: NEGATIVE

## 2015-11-17 LAB — COMPREHENSIVE METABOLIC PANEL, PL
ALT, PL: 280 U/L — ABNORMAL HIGH (ref 0–35)
AST, PL: 104 U/L — ABNORMAL HIGH (ref 0–35)
Albumin, PL: 4 g/dL (ref 3.5–5.2)
Alk Phos, PL: 127 U/L — ABNORMAL HIGH (ref 35–105)
Anion Gap,PL: 11 (ref 7–16)
Bilirubin Total, PL: 0.5 mg/dL (ref 0.0–1.2)
CO2,Plasma: 29 mmol/L — ABNORMAL HIGH (ref 20–28)
Calcium, PL: 9.6 mg/dL (ref 8.6–10.2)
Chloride,Plasma: 99 mmol/L (ref 96–108)
Creatinine: 1.1 mg/dL — ABNORMAL HIGH (ref 0.51–0.95)
GFR,Black: 65 *
GFR,Caucasian: 56 * — AB
Glucose,Plasma: 92 mg/dL (ref 60–99)
Potassium,Plasma: 4.8 mmol/L — ABNORMAL HIGH (ref 3.4–4.7)
Sodium,Plasma: 139 mmol/L (ref 133–145)
Total Protein, PL: 6.9 g/dL (ref 6.3–7.7)
UN,Plasma: 23 mg/dL — ABNORMAL HIGH (ref 6–20)

## 2015-11-17 LAB — IGG: IgG: 769 mg/dL (ref 700–1600)

## 2015-11-17 LAB — LD, PL: LD, PL: 206 U/L (ref 118–225)

## 2015-11-17 LAB — MAGNESIUM, PLASMA: Magnesium, PL: 1.7 mEq/L (ref 1.3–2.1)

## 2015-11-17 LAB — NEUTROPHIL #-INSTRUMENT: Neutrophil #-Instrument: 3.4 10*3/uL

## 2015-11-17 NOTE — Progress Notes (Signed)
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    PMH, Oncology History & Problem List  Reviewed and updated as needed    Interval History/Review of Systems  Madicyn is feeling well today  Shoulder/hip pain improved after taking ibuprofen   Has known arthritis, had pain prior to BMT  Seeing PCP for additional managemet  Busy at home  Planning to go to [place] for a month after X-mas  Will drive down with folks and fly back   Will provide a blood script so can get labs checked while there - closer to date   Aware of need to avoid sun due to GVHD risk  Eating better  No diarrhea  No cough or SOB  No rash  mediport removed 11/2    Other pertinent findings checked/documented below     A comprehensive review of 13 systems was performed; pertinent positives checked below:  _0  fevers  _1  fast pulse _2  decreased appetite  _3  weakness   _4  chills _5  chest pain _6  altered taste  _7  poor mobility    _8  dry eyes _9  nausea better  _10  insufficient oral intake  _11  impaired ADLs   _12  visual changes  _13  emesis  _14  skin changes alopecia _15  insomnia    _16  headache  _17  abdominal cramping  _18  rash  _19  leg edema better   _20  sores in mouth _21  abdominal discomfort _22  urinary frequency _23     _24  sore tongue  _25  diarrhea, watery _26  dysuria and hematuria  _27  anxiety    _28  cough   _29  loose stool, non-formed _30  bleeding  _31  poor coping    _32  sputum production  _33  constipation   _34  pain shoulder/hip joints _35  neuropathy   _36  shortness of breath/DOE   _37   weight loss  _38  fatigue  _39  none of the above      Patient Active Problem List   Diagnosis Code    MPN (myeloproliferative neoplasm) D47.1    Myeloproliferative disease D47.1    post allogenic MUD (F)  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 Z94.81     Immunocompromised D84.9    Hypomagnesemia E83.42    Hyperkalemia E87.5    Shoulder pain, bilateral M25.511, M25.512    Nausea R11.0    Hip pain M25.559       Medications  Reviewed medications with patient today & electronically reconciled  Current Outpatient Prescriptions   Medication Sig    ERGOCALCIFEROL 50000 UNITS capsule Take 1 capsule by mouth once a week    ibuprofen (ADVIL,MOTRIN) 200 MG tablet Take 600 mg by mouth daily    apixaban (ELIQUIS) 5 MG tablet Take 1 tablet (5 mg total) by mouth 2 times daily    dapsone 100 MG tablet Take 1 tablet (100 mg total) by mouth daily for 30 days    Budesonide (ENTOCORT EC) 3 MG 24 hr capsule Take 1 capsule (3 mg total) by mouth 2 times daily    ondansetron (ZOFRAN) 4 MG tablet Take 1 tablet (4 mg total) by mouth 3 times daily as needed (nausea)    biotin 5 MG tablet Take 5 mg by mouth daily    tacrolimus (PROGRAF) 1 MG capsule Take 1  mg by mouth 2 times daily   1 mg BID as of 10/12    metoprolol (LOPRESSOR) 50 MG tablet Take 1 tablet (50 mg total) by mouth 2 times daily    pantoprazole (PROTONIX) 40 MG EC tablet Take 1 tablet (40 mg total) by mouth 2 times daily   Swallow whole. Do not crush, break, or chew.    MAGNESIUM PO Take 2 tablets by mouth 2 times daily       acyclovir (ZOVIRAX) 400 MG tablet Take 1 tablet (400 mg total) by mouth 2 times daily    fluconazole (DIFLUCAN) 200 MG tablet Take 1 tablet (200 mg total) by mouth nightly    prochlorperazine (COMPAZINE) 10 MG tablet Take 1 tablet (10 mg total) by mouth 4 times daily as needed for Nausea    escitalopram (LEXAPRO) 10 MG tablet Take 15 mg by mouth daily     No current facility-administered medications for this visit.          Recent labs - reviewed    Recent Results (from the past 72 hour(s))   CBC and differential    Collection Time: 11/17/15 10:21 AM   Result Value Ref Range    WBC 5.3 4.0 - 10.0 THOU/uL    RBC 3.6 (L) 3.9 - 5.2 MIL/uL    Hemoglobin 10.9 (L) 11.2 - 15.7 g/dL    Hematocrit  35 34 - 45 %    MCV 97 (H) 79 - 95 fL    MCH 31 26 - 32 pg/cell    MCHC 32 32 - 36 g/dL    RDW 15.8 (H) 11.7 - 14.4 %    Platelets 188 160 - 370 THOU/uL    Seg Neut % 65.1 %    Lymphocyte % 13.5 %    Monocyte % 13.0 %    Eosinophil % 5.5 %    Basophil % 0.8 %    Neut # K/uL 3.4 1.6 - 6.1 THOU/uL    Lymph # K/uL 0.7 (L) 1.2 - 3.7 THOU/uL    Mono # K/uL 0.7 0.2 - 0.9 THOU/uL    Eos # K/uL 0.3 0.0 - 0.4 THOU/uL    Baso # K/uL 0.0 0.0 - 0.1 THOU/uL    Nucl RBC % 0.0 0.0 - 0.2 /100 WBC    Nucl RBC # K/uL 0.0 0.0 - 0.0 THOU/uL    IMM Granulocytes # 0.1 0.0 - 0.1 THOU/uL    IMM Granulocytes 2.1 %   Type and screen    Collection Time: 11/17/15 10:21 AM   Result Value Ref Range    ABO RH Blood Type A RH POS    Neutrophil #-Instrument    Collection Time: 11/17/15 10:21 AM   Result Value Ref Range    Neutrophil #-Instrument 3.4 THOU/uL     Results for JAMAYAH, MYSZKA (MRN [id number])    Ref. Range 11/17/2015 10:21   Sodium,Plasma Latest Ref Range: 133 - 145 mmol/L 139   Potassium,Plasma Latest Ref Range: 3.4 - 4.7 mmol/L 4.8 (H)   Chloride,Plasma Latest Ref Range: 96 - 108 mmol/L 99   CO2,Plasma Latest Ref Range: 20 - 28 mmol/L 29 (H)   Anion Gap,PL Latest Ref Range: 7 - 16  11   UN,Plasma Latest Ref Range: 6 - 20 mg/dL 23 (H)   Creatinine Latest Ref Range: 0.51 - 0.95 mg/dL 1.10 (H)   GFR,Black Latest Units: * 65   GFR,Caucasian Latest Units: * 56 (!)   Glucose,Plasma Latest Ref  Range: 60 - 99 mg/dL 92   Calcium, PL Latest Ref Range: 8.6 - 10.2 mg/dL 9.6   Magnesium, PL Latest Ref Range: 1.3 - 2.1 mEq/L 1.7   Total Protein, PL Latest Ref Range: 6.3 - 7.7 g/dL 6.9   Albumin, PL Latest Ref Range: 3.5 - 5.2 g/dL 4.0   LD, PL Latest Ref Range: 118 - 225 U/L 206   ALT, PL Latest Ref Range: 0 - 35 U/L 280 (H)   AST, PL Latest Ref Range: 0 - 35 U/L 104 (H)   Alk Phos, PL Latest Ref Range: 35 - 105 U/L 127 (H)   Bilirubin Total, PL Latest Ref Range: 0.0 - 1.2 mg/dL 0.5   WBC Latest Ref Range: 4.0 - 10.0 THOU/uL 5.3   RBC Latest Ref  Range: 3.9 - 5.2 MIL/uL 3.6 (L)   Hemoglobin Latest Ref Range: 11.2 - 15.7 g/dL 10.9 (L)   Hematocrit Latest Ref Range: 34 - 45 % 35   MCV Latest Ref Range: 79 - 95 fL 97 (H)   MCH Latest Ref Range: 26 - 32 pg/cell 31   MCHC Latest Ref Range: 32 - 36 g/dL 32   RDW Latest Ref Range: 11.7 - 14.4 % 15.8 (H)   Platelets Latest Ref Range: 160 - 370 THOU/uL 188   Neut # K/uL Latest Ref Range: 1.6 - 6.1 THOU/uL 3.4   Neutrophil #-Instrument Latest Units: THOU/uL 3.4   Lymph # K/uL Latest Ref Range: 1.2 - 3.7 THOU/uL 0.7 (L)   Mono # K/uL Latest Ref Range: 0.2 - 0.9 THOU/uL 0.7   Eos # K/uL Latest Ref Range: 0.0 - 0.4 THOU/uL 0.3   Baso # K/uL Latest Ref Range: 0.0 - 0.1 THOU/uL 0.0   IMM Granulocytes # Latest Ref Range: 0.0 - 0.1 THOU/uL 0.1   Nucl RBC # K/uL Latest Ref Range: 0.0 - 0.0 THOU/uL 0.0   Seg Neut % Latest Units: % 65.1   Lymphocyte % Latest Units: % 13.5   Monocyte % Latest Units: % 13.0   Eosinophil % Latest Units: % 5.5   Basophil % Latest Units: % 0.8   IMM Granulocytes Latest Units: % 2.1   Nucl RBC % Latest Ref Range: 0.0 - 0.2 /100 WBC 0.0   IgG Latest Ref Range: 700 - 1600 mg/dL 769       Wt Readings from Last 3 Encounters:   11/17/15 108.7 kg (239 lb 10.2 oz)   11/10/15 104.3 kg (230 lb)   11/03/15 105.8 kg (233 lb 4.8 oz)     Radiology impressions (last 3 days):  No results found.    Physical Exam   BP 122/59 (BP Location: Left arm, Patient Position: Sitting, Cuff Size: large adult)   Pulse 69   Temp 35.6 C (96.1 F) (Temporal)    Resp 18   Ht 167 cm (5' 5.75")   Wt 108.7 kg (239 lb 10.2 oz)   SpO2 97%   BMI 38.98 kg/m2   KPS  80%  Pain 0/10   No acute distress, non-toxic appearing, cooperative with exam   Appropriate affect and dress with normal speech and motor activity  scalp atraumatic, hair growing back   PERRLA- EOMI. Conjunctiva & corneas clear, no eye drainage. Sclera anicteric OU  Lips, oral mucosa and tongue moist, without lesions; no postpharyngeal exudate    Neck is supple, no rigidity    Lymph nodes: No cervical, submandibular, supraclavicular, infraclavicular, axillary, palpated  Normal S1 & S2, regular, no  murmur, rub, or gallop  Respirations unlabored, lung sounds clear to A&P bilateral auscultation  Bowel sounds are normoactive in four quadrants; abdomen is soft, non-tender, and without masses or organomegaly  Radial pulses +2 and symmetric, CMS is intact  Trace non pitting bilat lower extremity edema, limited mobility with shoulder hip pain   Neuro AOx3, speech/language WNL, moving all extremities, pain noted in hips.  Non-focal exam  Skin warm dry intact no lesions, No rash mild hyperpigmentation fading  Central lines Right mediport  site clean dry - removed 11/2.     Assessment   Pleasant  55 yo female with MPN post allogeneic MUD (F) PBSCT, here for follow up. No clinical signs of infection or GVHD. On IST taper, Tacro dose 1 mg BID, no change this visit . LFTs (enzymes) noted be higher today after pt left, considering actigall and liver US. No abd pain.     Plan  MPN post HSCT  Regimen: Fludarabine 40 mg/m2/d and Busulfan 130 mg/m2/d x 4 days   Transplant type: Allogeneic MUD PBSC 10/10 55 yr old female ABO : A+/O\A+. CMV titer:N/N  Transplant day 0:  05/17/15  Transplant day:  + 184  Day 30 marrow 6/6 The hypercellularity and architectural pattern is somewhat unusual, but no definitive evidence of malignancy is seen. Chimerism results indicate only low level (2%) recipient DNA.   Day 100 marrow 09/15/15 chimerism still 2%, no MDS  Will check blood chimerism this month 11/21to follow trend  1 yr marrow to be scheduled closer to date     Hematology:   Blood Counts stable   ANC 3400  PLT WNL  No signs of bleeding.  No transfusion  Central line Mediport site clean and dry   mediport out - 11/2     GvHD:   Donor MUD (female)  Completed all doses of MTX  Budesonide BID decrease to daily     Tacrolimus 1 mg BID on 10/12  Next taper in approx 4 weeks if no GVHD - due 11/21 by 0.5 mg  No need to  check levels on IST taper .  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  LFTs more elevated today - consider actigal if worsen further   Grade 0     ID:  Afebrile   No clinical signs of active infection  Immunocompromised  CMV PCR not required both negative  IgG >800 this month    May require IVIG in future, if <400 or clinical indication, will follow monthly   Cont ppx acyclovir    Cont ppx diflucan  PCP ppx dapsone     Re-immunizations at 1 yr post BMT as able to give     Cardiovascular/pulmonary:   BP WNL  HR stable   Cont  metoprolol for now   Cardiology follow up as needed  seeng later this month  Likely has OSA- will eventually need testing    No leg edema     Doing well with nicotine cravings- off nicotine patches  Refer to smoking cessation clinic as needed   .   Renal/FEN:   Creat improved with Tacrolimus taper now WNL  Cont PO magneium - reduce to 1 tab bid, cont to decrease with Tacro taper, level good  Hyperkalemia better with taper   Tapering Tacro- off florinef late Sept - levels good  No dysuria or hematuria  Other lytes stable    Gastrointestinal:   Nausea better has PRN medications as  needed, not using    No diarrhea.   Cont protonix   Eating well and drinking well   Weight up  LFTs  transamitis - slightly worse - considering actigall  - possible GVHD vs medication vs other etiology  Consider liver US as well     Endocrine:   Stable.   Eventually bone health meds and Dexa scan at 1 yr     Neurologic:   Nonfocal; no deficits.   No H/A or visual changes.   Monitor for neuro changes on Tacro   Shoulder pain  with degenerative changes ibuprofen helpful  Hip pain as well to see PCP for additional management    Musculoskeletal:   activity as tolerated   Walking more and doing more, may explain incease joint pain   Joint pain shoulders/hip - arthritis PCP following  Films degenerative changes    Derm  Alopecia improving - hair growth    Skin intact  No rashes    Mild  hyperpigmentation better     Psychosocial:   Family supportive.   Continue our support.   Traveling to [place] late December for 1 month   Labs at least once while there   .   BMT attending [place]   BMT coordinator [place]   PCP [place]  Adjust plan of care as needed.     Follow up  BMT clinic every other week with labs   Tacro taper every 4 weeks - due 11/21 as long as no worsening of LFTS    Irene Pap, NP

## 2015-11-21 NOTE — Patient Instructions (Addendum)
November 2017   Sunday Monday Tuesday Wednesday Thursday Friday Saturday                  1     2     3     4       5     6     7     8     9   10     11       12     13     14   15     16     17     18       19     20     21   22     23     24     25       26     27     28     29     FOLLOW UP     2:00 PM   Cardiology at [place] 07 January 2016   Sunday Monday Tuesday Wednesday Thursday Friday Saturday                            1     2       3     4     5     6     7     INJECTION   10:00 AM     FOLLOW UP   10:30 AM 8     9       10     11     12     13     14     15     16       17     18     19     20     21     INJECTION    9:30 AM     FOLLOW UP   10:00 AM 22     23       24     25     26     27     28     29     30       31                                             Resume Dapsone  Decrease budesonide to once daily

## 2015-11-22 ENCOUNTER — Ambulatory Visit
Admission: RE | Admit: 2015-11-22 | Discharge: 2015-11-22 | Disposition: A | Discharge status: Home / Self Care | Payer: Self-pay | Source: Ambulatory Visit | Attending: Cardiology | Admitting: Cardiology

## 2015-11-22 ENCOUNTER — Ambulatory Visit: Payer: Self-pay | Admitting: Cardiology

## 2015-11-22 ENCOUNTER — Ambulatory Visit
Admission: RE | Admit: 2015-11-22 | Discharge: 2015-11-22 | Disposition: A | Discharge status: Home / Self Care | Payer: Self-pay | Source: Ambulatory Visit | Admitting: Cardiology

## 2015-11-22 DIAGNOSIS — I4892 Unspecified atrial flutter: Secondary | ICD-10-CM

## 2015-11-22 LAB — ECHO COMPLETE
Aortic Diameter (mid tubular): 3.1 cm
Aortic Diameter (sinus of Valsalva): 3.1 cm
BMI: 39.1 kg/m2
BSA: 2.25 m2
Deceleration Time - MV: 307 ms
E/A ratio: 0.94
Heart Rate: 60 {beats}/min
Height: 65.748 in
LA Diameter BSA Index: 1.7 cm/m2
LA Diameter Height Index: 2.3 cm/m
LA Diameter: 3.9 cm
LA Systolic Vol BSA Index: 17.3 mL/m2
LA Systolic Vol Height Index: 23.4 mL/m
LA Systolic Volume: 39 mL
LV ASE Mass BSA Index: 92.1 gm/m2
LV ASE Mass Height 2.7 Index: 51.9 gm/m2.7
LV ASE Mass Height Index: 124 gm/m
LV ASE Mass: 207.1 gm
LV Posterior Wall Thickness: 1.1 cm
LV Septal Thickness: 1.1 cm
LVED Diameter BSA Index: 2.2 cm/m2
LVED Diameter Height Index: 3 cm/m
LVED Diameter: 5 cm
LVOT Area (calculated): 2.83 cm2
LVOT Cardiac Index: 1.35 L/min/m2
LVOT Cardiac Output: 3.04 L/min
LVOT Diameter: 1.9 cm
LVOT PWD VTI: 17.9 cm
LVOT PWD Velocity (mean): 64.5 cm/s
LVOT PWD Velocity (peak): 96.2 cm/s
LVOT SV BSA Index: 22.54 mL/m2
LVOT SV Height Index: 30.4 mL/m
LVOT Stroke Rate (mean): 182.8 mL/s
LVOT Stroke Rate (peak): 272.6 mL/s
LVOT Stroke Volume: 50.73 cc
MV Peak A Velocity: 57.8 cm/s
MV Peak E Velocity: 54.4 cm/s
Mitral Annular E/Ea Vel Ratio: 6.89
Mitral Annular Ea Velocity: 7.9 cm/s
RA Volume BSA Index: 13.8 mL/m2
RA Volume Height Index: 18.6 mL/m
RA Volume: 31 mL
RR Interval: 1000 ms
Weight (lbs): 239.64 [lb_av]
Weight: 3834.24 oz

## 2015-11-22 NOTE — Discharge Instructions (Signed)
This recording of your ECG is a common procedure that requires your cooperation for the best possible results.   Write any symptoms that you feel as you go about your normal daily activities.  For each event, please note any activity you were doing at that time (see examples).     Symptoms May Include:   Chest, arm or neck pain.  Shortness of breath or difficulty breathing.  Skipped beats, palpitations or pounding heart beats.  Dizziness, lightheadedness, nausea or faintness.   Any symptom that caused you to see your doctor.     Activities May Include:   Going to bed and waking up.  Start and stop of exercise (walking, running, cycling).  Time of medication.    IMPORTANT!    If your symptom is severe, seek medical attention.   The recorder cannot help you and will not contact medical personnel.    Keep the recorder dry! Sponge bath if necessary.    Avoid electric blankets and industrial machinery.     "Sample Diary"    Date and Time of Event    Symptom/Activity   9:30am    Dizziness/ Walked to car   11:12am    Chest Pain/ Watching TV            How to remove the monitor:  · Remove the recorder after the appropriate amount of time (24 or 48 hours) by peeling off the adhesive electrodes.  Discard the electrodes.  You do not have to turn off the monitor.    · Place the wires, recorder, carrying pouch and diary in the plastic bag and then in the padded envelope with the green label.    · Please return the recorder as soon as possible after the removal.    Where to return the monitor:    Sunday to Saturday 7:00 am to 7:00 pm  Please return the monitor to Patient Discharge.  Parking staff may / or may not be able to take the monitor, depending on their availability.  The attendant may take the monitor or, if not, you may be directed to a parking spot.  If the attendant is not available, please bring the padded envelope into the monitor drop box across from the [employer], adjacent to Patient Discharge (just past the  parking ticket machine) and place it in the designated slot.    After Hours: 7:00 pm to 7:00 am  Please return the monitor to the Information Desk in the Main Lobby of [facility].  Drive into the traffic circle in the front of the hospital ([address] side) and pull to the far right.  Put the flashers on the car and bring the monitor into the Information Desk and hand it to someone behind the desk.

## 2015-11-23 ENCOUNTER — Other Ambulatory Visit: Payer: Self-pay | Admitting: Oncology

## 2015-11-23 DIAGNOSIS — R748 Abnormal levels of other serum enzymes: Secondary | ICD-10-CM | POA: Insufficient documentation

## 2015-11-23 DIAGNOSIS — Z9481 Bone marrow transplant status: Secondary | ICD-10-CM

## 2015-11-23 DIAGNOSIS — D471 Chronic myeloproliferative disease: Secondary | ICD-10-CM

## 2015-11-23 MED ORDER — URSODIOL 300 MG PO CAPS *I*
300.0000 mg | ORAL_CAPSULE | Freq: Two times a day (BID) | ORAL | 6 refills | Status: DC
Start: 2015-11-23 — End: 2016-03-01

## 2015-11-25 ENCOUNTER — Telehealth: Payer: Self-pay | Admitting: Oncology

## 2015-11-25 NOTE — Telephone Encounter (Signed)
Spoke with Carly Higgins   ultrasound has been scheduled for 11/29/15 at 1pm at [address]  Explained to patient she needs to have nothing to eat or drink 6 hours prior   Patient verbalized understanding

## 2015-11-25 NOTE — Telephone Encounter (Signed)
Called [place]   Left vm   Explained her liver enzymes were more elevated and wanted to start a medication and do [place]  Asked her to call me back for more details    Irene Pap, NP

## 2015-11-29 ENCOUNTER — Ambulatory Visit: Payer: Self-pay

## 2015-11-29 ENCOUNTER — Ambulatory Visit: Payer: Self-pay | Admitting: Oncology

## 2015-11-29 ENCOUNTER — Ambulatory Visit
Admission: RE | Admit: 2015-11-29 | Discharge: 2015-11-29 | Disposition: A | Discharge status: Home / Self Care | Payer: Self-pay | Source: Ambulatory Visit | Attending: Hematology | Admitting: Hematology

## 2015-11-29 ENCOUNTER — Telehealth: Payer: Self-pay | Admitting: Cardiology

## 2015-11-29 ENCOUNTER — Other Ambulatory Visit
Admission: RE | Admit: 2015-11-29 | Discharge: 2015-11-29 | Disposition: A | Discharge status: Home / Self Care | Payer: Self-pay | Source: Ambulatory Visit | Attending: Oncology | Admitting: Oncology

## 2015-11-29 VITALS — BP 124/93 | HR 68 | Temp 97.0°F | Resp 18 | Ht 65.75 in | Wt 239.8 lb

## 2015-11-29 DIAGNOSIS — R748 Abnormal levels of other serum enzymes: Secondary | ICD-10-CM

## 2015-11-29 DIAGNOSIS — D849 Immunodeficiency, unspecified: Secondary | ICD-10-CM

## 2015-11-29 DIAGNOSIS — E875 Hyperkalemia: Secondary | ICD-10-CM

## 2015-11-29 DIAGNOSIS — Z9481 Bone marrow transplant status: Secondary | ICD-10-CM

## 2015-11-29 DIAGNOSIS — D471 Chronic myeloproliferative disease: Secondary | ICD-10-CM

## 2015-11-29 LAB — HOLTER MONITOR - 48 HOUR
AF Count: 0
AIVR/IVR Runs: 0
Analysis Time: 0:0 {titer}
Analyze Time Length Hours: 48
Analyze Time Length Minutes: 33
Bigeminy: 0
Bradycardia Runs: 0
Couplet: 0
Max Heart Rate: 98
Mean Heart Rate: 70
Min Heart Rate: 56
Pauses: 0
R on T: 0
SVE Max Per Hour Time: 4:0 {titer}
SVE Max Per Hour: 8
SVE Percent Beats: 0.05 %
SVE Total Beats: 100
SVE couplet: 3
SVT Runs: 2
SVT longest run: 3
SVT max rate: 123 {beats}/min
Tachycardia Runs: 0
Total Beats: 192800
Trigeminy: 0
Triplet: 0
VE Max Per Hour Time: 16:0 {titer}
VE Max Per Hour: 5
VE Percent Beats: 0.01 %
VE Total Beats: 25
VT Runs: 0

## 2015-11-29 LAB — COMPREHENSIVE METABOLIC PANEL, PL
ALT, PL: 78 U/L — ABNORMAL HIGH (ref 0–35)
AST, PL: 31 U/L (ref 0–35)
Albumin, PL: 4.3 g/dL (ref 3.5–5.2)
Alk Phos, PL: 103 U/L (ref 35–105)
Anion Gap,PL: 11 (ref 7–16)
Bilirubin Total, PL: 0.4 mg/dL (ref 0.0–1.2)
CO2,Plasma: 28 mmol/L (ref 20–28)
Calcium, PL: 9.6 mg/dL (ref 8.6–10.2)
Chloride,Plasma: 98 mmol/L (ref 96–108)
Creatinine: 0.95 mg/dL (ref 0.51–0.95)
GFR,Black: 78 *
GFR,Caucasian: 67 *
Glucose,Plasma: 95 mg/dL (ref 60–99)
Potassium,Plasma: 5.1 mmol/L — ABNORMAL HIGH (ref 3.4–4.7)
Sodium,Plasma: 137 mmol/L (ref 133–145)
Total Protein, PL: 7 g/dL (ref 6.3–7.7)
UN,Plasma: 26 mg/dL — ABNORMAL HIGH (ref 6–20)

## 2015-11-29 LAB — CBC AND DIFFERENTIAL
Baso # K/uL: 0.1 10*3/uL (ref 0.0–0.1)
Basophil %: 0.9 %
Eos # K/uL: 0.7 10*3/uL — ABNORMAL HIGH (ref 0.0–0.4)
Eosinophil %: 11.3 %
Hematocrit: 37 % (ref 34–45)
Hemoglobin: 11.9 g/dL (ref 11.2–15.7)
IMM Granulocytes #: 0.1 10*3/uL (ref 0.0–0.1)
IMM Granulocytes: 1.2 %
Lymph # K/uL: 0.9 10*3/uL — ABNORMAL LOW (ref 1.2–3.7)
Lymphocyte %: 15.1 %
MCH: 30 pg/cell (ref 26–32)
MCHC: 32 g/dL (ref 32–36)
MCV: 96 fL — ABNORMAL HIGH (ref 79–95)
Mono # K/uL: 0.6 10*3/uL (ref 0.2–0.9)
Monocyte %: 10.8 %
Neut # K/uL: 3.5 10*3/uL (ref 1.6–6.1)
Nucl RBC # K/uL: 0 10*3/uL (ref 0.0–0.0)
Nucl RBC %: 0 /100 WBC (ref 0.0–0.2)
Platelets: 197 10*3/uL (ref 160–370)
RBC: 3.9 MIL/uL (ref 3.9–5.2)
RDW: 15.4 % — ABNORMAL HIGH (ref 11.7–14.4)
Seg Neut %: 60.7 %
WBC: 5.8 10*3/uL (ref 4.0–10.0)

## 2015-11-29 LAB — LD, PL: LD, PL: 182 U/L (ref 118–225)

## 2015-11-29 LAB — NEUTROPHIL #-INSTRUMENT: Neutrophil #-Instrument: 3.5 10*3/uL

## 2015-11-29 LAB — MAGNESIUM, PLASMA: Magnesium, PL: 1.6 mEq/L (ref 1.3–2.1)

## 2015-11-29 LAB — TYPE AND SCREEN
ABO RH Blood Type: A POS
Antibody Screen: NEGATIVE

## 2015-11-29 MED ORDER — METOPROLOL TARTRATE 50 MG PO TABS *I*
50.0000 mg | ORAL_TABLET | Freq: Two times a day (BID) | ORAL | 4 refills | Status: DC
Start: 2015-11-29 — End: 2016-03-29

## 2015-11-29 NOTE — Telephone Encounter (Signed)
Message  Received: 2 days ago     Carly Ruddy, MD  [facility] Cardiology Nurse                Echo was wnl.

## 2015-11-29 NOTE — Progress Notes (Signed)
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  LFTs improving, will monitor.   Occasional nausea- using Zofran. Complaining of dry mouth, getting some biotene rinse.   Denies diarrhea, vomiting, fever, URI s/sx  Encouraged flu shot  Going to [place] next month, worried about refilling her medications before the end of December  Denies smoking  Cont current Tacro dose, will monitor nausea, dry mouth, and LFTs.  Potassium 5.1, monitor.        Other pertinent findings checked/documented below  []  fevers  []  palpitations []  urinary frequency   []  chills []  chest pain []  dysuria    []  dry eyes [x]  nausea []  rash   []  ear pain []  abdominal cramping []  skin changes   [x]  dry mouth []  abdominal discomfort []  pain   []  sore throat []  loose stool, non-formed []  fatigue   []  cough  []  diarrhea, watery []  enlarged lymph nodes   []  sputum production  []  weight loss []  bleeding   []  shortness of breath []  insufficient oral intake []  none of the above     Medications  Reviewed medications with patient today & electronically reconciled  Current Outpatient Prescriptions on File Prior to Visit   Medication Sig Dispense Refill    ERGOCALCIFEROL 50000 UNITS capsule Take 1 capsule by mouth once a week      ibuprofen (ADVIL,MOTRIN) 200 MG tablet Take 600 mg by mouth daily      apixaban (ELIQUIS) 5 MG tablet Take 1 tablet (5 mg total) by mouth 2 times daily 60 tablet 11    Budesonide (ENTOCORT EC) 3 MG 24 hr capsule Take 1 capsule (3 mg total) by mouth 2 times daily 60 capsule 6    ondansetron (ZOFRAN) 4 MG tablet Take 1 tablet (4 mg total) by mouth 3 times daily as needed (nausea) 90 tablet 0    biotin 5 MG tablet Take 5 mg by mouth daily      tacrolimus (PROGRAF) 1  MG capsule Take 1 mg by mouth 2 times daily   1 mg BID as of 10/12      pantoprazole (PROTONIX) 40 MG EC tablet Take 1 tablet (40 mg total) by mouth 2 times daily   Swallow whole. Do not crush, break, or chew. 60 tablet 5    MAGNESIUM PO Take 2 tablets by mouth 2 times daily         acyclovir (ZOVIRAX) 400 MG tablet Take 1 tablet (400 mg total) by mouth 2 times daily 120 tablet 5    fluconazole (DIFLUCAN) 200 MG tablet Take 1 tablet (200 mg total) by mouth nightly 120 tablet 5    escitalopram (LEXAPRO) 10 MG tablet Take 15 mg by mouth daily      ursodiol (ACTIGALL) 300 MG capsule Take 1 capsule (300 mg total) by mouth 2 times daily   And as directed by MD/NP 90 capsule 6    dapsone 100 MG tablet Take 1 tablet (100 mg total) by mouth daily for 30 days 30 tablet 6    prochlorperazine (COMPAZINE) 10 MG tablet Take 1 tablet (10 mg total) by mouth 4 times daily as needed for Nausea 40 tablet 6     No current  facility-administered medications on file prior to visit.        Recent labs  Reviewed, see below    VS, weight, KPS  BP (!) 124/93 (BP Location: Right arm, Patient Position: Sitting, Cuff Size: large adult)   Pulse 68   Temp 36.1 C (97 F) (Temporal)    Resp 18   Ht 167 cm (5' 5.75")   Wt 108.7 kg (239 lb 12 oz)   SpO2 97%   BMI 38.99 kg/m2    Wt Readings from Last 3 Encounters:   11/29/15 108.7 kg (239 lb 12 oz)   11/22/15 108.7 kg (239 lb 10.2 oz)   11/17/15 108.7 kg (239 lb 10.2 oz)       KPS%: 90    Physical Exam     General Appearance:  Mental status  No acute distress, non-toxic appearing; came with mother  Normal affect, speech, dress, motor activity   Head:  Normocephalic, atraumatic   Eyes:  Conjunctiva/corneas clear, no eye drainage   Oral/Throat: Lips, mucosa, and tongue moist,   no postpharyngeal exudate   Neck: Supple, normal turgor   Heart: S1, S2 normal, regular rhythm, no murmur, rub or gallop   Lungs:   Respirations unlabored, clear to A&P auscultation bilaterally   Abdomen:   Soft,  non-tender, bowel sounds active all four quadrants,   tympanic, no masses, no organomegaly   Extremities: No edema   Pulses: 2+ and symmetric   Skin: No rashes or lesion       Recent Results (from the past 24 hour(s))   CBC and differential    Collection Time: 11/29/15  9:50 AM   Result Value Ref Range    WBC 5.8 4.0 - 10.0 THOU/uL    RBC 3.9 3.9 - 5.2 MIL/uL    Hemoglobin 11.9 11.2 - 15.7 g/dL    Hematocrit 37 34 - 45 %    MCV 96 (H) 79 - 95 fL    MCH 30 26 - 32 pg/cell    MCHC 32 32 - 36 g/dL    RDW 15.4 (H) 11.7 - 14.4 %    Platelets 197 160 - 370 THOU/uL    Seg Neut % 60.7 %    Lymphocyte % 15.1 %    Monocyte % 10.8 %    Eosinophil % 11.3 %    Basophil % 0.9 %    Neut # K/uL 3.5 1.6 - 6.1 THOU/uL    Lymph # K/uL 0.9 (L) 1.2 - 3.7 THOU/uL    Mono # K/uL 0.6 0.2 - 0.9 THOU/uL    Eos # K/uL 0.7 (H) 0.0 - 0.4 THOU/uL    Baso # K/uL 0.1 0.0 - 0.1 THOU/uL    Nucl RBC % 0.0 0.0 - 0.2 /100 WBC    Nucl RBC # K/uL 0.0 0.0 - 0.0 THOU/uL    IMM Granulocytes # 0.1 0.0 - 0.1 THOU/uL    IMM Granulocytes 1.2 %   Neutrophil #-Instrument    Collection Time: 11/29/15  9:50 AM   Result Value Ref Range    Neutrophil #-Instrument 3.5 THOU/uL       Assessment and Plan  Carly Higgins is a 55 yo female with unclassifiable myeloproliferative neoplasm and completed Decitabine 65m/m2 IV daily x 5 days - Cycle # 1 12/15/14 and had been on Hydrea prior to transplant. 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  The patient came to clinic today for routine follow up.    Atypical MPN S/P URD allo, day +196  Day 30 marrow (6/6) hypercellular marrow, no evidence of malignancy, chimerism 2% recipient  Day 100 BMbx (09/15/15) Normochromic normocytic anemia. Mild lymphopenia. Mildly hypercellular bone marrow with trilineage hematopoiesis. Chimerism 2% recipient.    Heme  Engrafted  Transfusion independent; remains anemic; this is no doubt  multifactorial; will obtain w/u if worsens.  Should improve once tacrolimus is tapered.     GVHD  Full dose MTX given day 1,3,6. Day 11 mtx held due to elevated bilirubin and mucositis  Tacro taper  Tacro dose: 1 mg BID  Budesonide- now daily  No signs of GVHD today, occasional nausea and recent elevation of LFTs, monitor.     ID  Hx Cholecystitis, pneumonia 05/2015  Immunocompromised  CMV PCR not required both negative    Continue acyclovir and fluconazole ppx  Cont Dapsone 100 mg/day for PCP ppx (unable to tolerate Bactrim due to hyperkalemia)    If IgG <400 or clinical indication, will consider IVIG; she has not needed this.    IgG 769 on 11/9    Encouraged Flu shot  No signs of infection today    CV/Pulm  Hx A-flutter  Cont Metoprolol 50 mg BID  Followed by Dr. Kathi Der, Cardiology  Off cardizem, stopped by Dr. Kathi Der  Has oxygen at home, but has not needed it   Possible OSA, needs sleep study at some point soon    Acute KI on CKD r/t tacrolimus- resolving  Creatinine 0.95 today, stable  Encouraged PO intake    Magnesium 1.6 today, oral magnesium supplement:4 tabs/day    Hyperkalemia r/t medication and acute KI (tacro)  Off florinef  Potassium 5.1 today, monitor    GI  Cont protonix twice daily   Zofran prn   LFTs were elevated- was started on actigall, but did not actually start taking medication. Will pick up today.   RUQ [place] today- pending.  LFTs much improved, monitor.     Smoking cessation  Has been able to abstain thus far    Follow up  BMT clinic: every 2 weeks  Bloodwork frequency: labs prior    BMT [name]  Referring [name]  Lives in [place], NP

## 2015-12-05 LAB — CHIMERISM

## 2015-12-05 LAB — CHIM REVIEW

## 2015-12-07 ENCOUNTER — Ambulatory Visit: Payer: Self-pay

## 2015-12-07 ENCOUNTER — Ambulatory Visit: Payer: Self-pay | Admitting: Cardiology

## 2015-12-07 VITALS — BP 139/85 | HR 63 | Ht 66.0 in | Wt 240.0 lb

## 2015-12-07 DIAGNOSIS — I4892 Unspecified atrial flutter: Secondary | ICD-10-CM

## 2015-12-07 NOTE — Progress Notes (Signed)
I have had the pleasure of seeing your patient, Carly Higgins in the Cardiology Clinic at the outpatient office at [facility].  Below is a summary of our recent visit:       HPI:  [name] a 55 y.o. female with a history of myeloproliferative disorder  who presents for follow up visit. In May she underwent busulfan/fludarabine conditioning followed by a MUD allogeneic PBSCT in May 2017. During the hospitalization she was tachycardic with A. Flutter to the 160s and had pulmonary edema. She was diuresed without effect on her heart rate, and metoprolol was started and then increased.. On 5/21 she became hypoxic, fluids were stopped and she was further diuresed. Echo with no abnormalities, CTA was negative for PE and showed likely pneumonia. She was noted to have a short run of asymptomatic VT on telemetry. She remains in NSR and is doing better.She continues with fatigue but is able to complete her ADLs without symptoms. She denies chest pain, palpitations, DOE, SOB, PND, orthopnea, leg edema. She lost 30 Ib since April 2017. She also stopped smoking (used to smoke 1 PPD).    Past Medical History:   Diagnosis Date    Atrial fibrillation     fib/flutter 05/2015    CHF (congestive heart failure)     pulmonary edema 05/28/15    GERD (gastroesophageal reflux disease)     Hypoxia 06/11/2015    Impaired mobility and ADLs 06/11/2015    Leukocytosis 09/22/2013    post allogenic MUD (F)  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    Conditioned w/ Busulfan 130 mg/m2/d and Fludarabine 40 mg/m2/d x4 days followed by 10/10 HLA MUD (F) Allogeneic PBSCT (pt/donor: ABO: A+/A+, CMV N/N)    Pulmonary edema     Shortness of breath    Depression/anxiety.  Past Surgical History:   Procedure Laterality Date    ROTATOR CUFF REPAIR Right        Review of Systems   Constitutional: Positive for malaise/fatigue. Negative for chills and fever.   HENT: Negative.    Respiratory: Positive for shortness of breath. Negative for  cough.         See HPI   Cardiovascular: Negative.  Negative for chest pain, palpitations, orthopnea, claudication, leg swelling and PND.   Gastrointestinal: Positive for heartburn and nausea.   Neurological: Negative.  Negative for dizziness.   Psychiatric/Behavioral: Negative.      Medications     Current Outpatient Prescriptions   Medication Sig    metoprolol (LOPRESSOR) 50 MG tablet Take 1 tablet (50 mg total) by mouth 2 times daily    ursodiol (ACTIGALL) 300 MG capsule Take 1 capsule (300 mg total) by mouth 2 times daily   And as directed by MD/NP    ERGOCALCIFEROL 50000 UNITS capsule Take 1 capsule by mouth once a week    ibuprofen (ADVIL,MOTRIN) 200 MG tablet Take 600 mg by mouth daily    apixaban (ELIQUIS) 5 MG tablet Take 1 tablet (5 mg total) by mouth 2 times daily    dapsone 100 MG tablet Take 1 tablet (100 mg total) by mouth daily for 30 days    Budesonide (ENTOCORT EC) 3 MG 24 hr capsule Take 1 capsule (3 mg total) by mouth 2 times daily (Patient taking differently: Take 3 mg by mouth every morning   )    ondansetron (ZOFRAN) 4 MG tablet Take 1 tablet (4 mg total) by mouth 3 times daily as needed (nausea)  biotin 5 MG tablet Take 5 mg by mouth daily    tacrolimus (PROGRAF) 1 MG capsule Take 1 mg by mouth 2 times daily   1 mg BID as of 10/12    pantoprazole (PROTONIX) 40 MG EC tablet Take 1 tablet (40 mg total) by mouth 2 times daily   Swallow whole. Do not crush, break, or chew.    MAGNESIUM PO Take 2 tablets by mouth 2 times daily       acyclovir (ZOVIRAX) 400 MG tablet Take 1 tablet (400 mg total) by mouth 2 times daily    fluconazole (DIFLUCAN) 200 MG tablet Take 1 tablet (200 mg total) by mouth nightly    prochlorperazine (COMPAZINE) 10 MG tablet Take 1 tablet (10 mg total) by mouth 4 times daily as needed for Nausea    escitalopram (LEXAPRO) 10 MG tablet Take 15 mg by mouth daily         No Known Allergies (drug, envir, food or latex)  Vitals and Physical Exam     Carly Higgins's  height  is 1.676 m ('5\' 6"'$ ) and weight is 108.9 kg (240 lb). Her blood pressure is 139/85 and her pulse is 63.  Body mass index is 38.74 kg/(m^2).  Repeat BP 132/80 mmHg.  Physical Exam   Constitutional: She is oriented to person, place, and time. She appears well-developed and well-nourished. No distress.   HENT:   Head: Normocephalic and atraumatic.   Neck: Normal range of motion. Neck supple. No JVD present. No thyromegaly present.   Cardiovascular: Normal rate, regular rhythm, S1 normal, S2 normal, normal heart sounds and intact distal pulses.  Exam reveals no gallop and no friction rub.    No murmur heard.  Pulmonary/Chest: Effort normal and breath sounds normal. No respiratory distress.   Abdominal: Soft. Bowel sounds are normal. She exhibits distension.   Musculoskeletal: Normal range of motion. She exhibits no edema.   Neurological: She is alert and oriented to person, place, and time.   Skin: Skin is warm and dry.   Psychiatric: She has a normal mood and affect. Her behavior is normal. Judgment and thought content normal.     Laboratory Data         Lab results: 11/29/15  0950   WBC 5.8   Hemoglobin 11.9   Hematocrit 37   RBC 3.9   Platelets 197         Chemistry        Lab results: 11/29/15  0950  09/13/15  0900   Sodium  --   --  140   Sodium,Plasma 137  < >  --    Potassium  --   --  4.8*   Potassium,Plasma 5.1*  < >  --    Chloride  --   --  101   Chloride,Plasma 98  < >  --    CO2  --   --  23   CO2,Plasma 28  < >  --    GFR,Caucasian 67  < > 41   GFR,Black 78  < > 50   UN  --   --  27*   UN,Plasma 26*  < >  --    Creatinine 0.95  < > 1.33*   < > = values in this interval not displayed.     Lab results: 11/29/15  0950  09/13/15  0900   Glucose  --   --  120*   Glucose,Plasma 95  < >  --  Calcium  --   --  9.9   Calcium, PL 9.6  < >  --    Total Protein  --   --  7.2   Total Protein, PL 7.0  < >  --    Albumin  --   --  4.1   Albumin, PL 4.3  < >  --    ALT  --   --  28   ALT, PL 78*  < >  --    AST  --   --   24   AST, PL 31  < >  --    Alk Phos  --   --  92   Alk Phos, PL 103  < >  --    Bilirubin,Total  --   --  0.4   Bilirubin Total, PL 0.4  < >  --    < > = values in this interval not displayed.         Lab results: 11/03/15  0949 05/26/15  0315 05/11/15  1649   Cholesterol 194  --   --    HDL 58  --   --    LDL Calculated 116  --   --    Triglycerides 100 156* 347*   Non HDL Cholesterol 136  --   --    Chol/HDL Ratio 3.3  --   --             Echo Complete 11/22/2015    Narrative Normal LV size, mild increase in LV mass, no regional dysfunction and   normal LV ejection fraction.  Mild mitral annular and aortic valvular   sclerosis without stenosis. Unable to accurately estimated pulmonary   artery systolic pressures but RV size and function appear normal.         Holter: Sinus rhythm. No AV block. No pauses exceeding 2.0 seconds were noted. Rare supraventricular ectopic beats. One SVE run (rate less then 100 BPM). Two SVT runs (rates over 100 BPM). Rare ventricular ectopic beats were noted. No VT. During patient symptoms rates in the 70's and 54' were observed with no ectopy present.    ECG (August 2017): NSR, first degree AV block.    Impression and Plan     This is an 55 y.o. female with myeloproliferative neoplasm s/p peripheral blood stem cell transplant on 5/9 who developed AF/ flutter while hospitalized and was started on diltiazem and metoprolol. She presents today for follow up visit. She denies cardiac symptoms, endorsing fatigue, nausea associated with chemotherapy. She is euvolemic on exam. Echo revealed normal LVEF 60%. Her BP is within goal. EKG in August with NSR. Holter with sinus rhythm.    1. A-Fib/flutter: EKG NSR: asymptomatic without evidence of recurrent AF: CHADSVasc score = 2.  Given propensity to hypercoagulable state 2/2 neoplasm as well as CHADSVasc score of 2, anticoagulation with Eliquis was initiated. She could stop Eliquis from cardiac standpoint, but then would have to be on Ec Aspirin  81 mg daily.  Continue metoprolol 50 mg BID.  Holter with no A. Fib/or A. Flutter. Echo with normal LVEF.  SBP was initially 139 mmHg, repeated 132 mmHg. Will continue Metoprolol for now.   2. Hyperkalemia: stable. Reduce potassium rich food intake.  3. Elevated creatinine: -normalized.   4. Unclassifiable myeloproliferative neoplasm s/p MUD allo PBSCT on 05/17/15.         Thank-you very much for allowing me to participate in the care of AUTRY DROEGE.  Jeneen Rinks. Please feel free to contact our office if you have any further questions/concerns.    Sincerely,    Billie Ruddy MD

## 2015-12-08 NOTE — Patient Instructions (Signed)
December 2017   Sunday Monday Tuesday Wednesday Thursday Friday Saturday                            1     2       3     4     5     6     7     INJECTION   10:00 AM     FOLLOW UP   10:30 AM 8     9       10     11     12     13     14     15     16       17     18     19     20     21     INJECTION    9:30 AM     FOLLOW UP   10:00 AM 22     23       24     25     26     27     28     29     30       31 

## 2015-12-15 ENCOUNTER — Other Ambulatory Visit
Admission: RE | Admit: 2015-12-15 | Discharge: 2015-12-15 | Disposition: A | Discharge status: Home / Self Care | Payer: Self-pay | Source: Ambulatory Visit | Attending: Oncology | Admitting: Oncology

## 2015-12-15 ENCOUNTER — Ambulatory Visit: Payer: Self-pay

## 2015-12-15 ENCOUNTER — Ambulatory Visit: Payer: Self-pay | Admitting: Hematology

## 2015-12-15 VITALS — BP 132/80 | HR 63 | Temp 96.1°F | Resp 18 | Wt 243.4 lb

## 2015-12-15 DIAGNOSIS — D471 Chronic myeloproliferative disease: Secondary | ICD-10-CM

## 2015-12-15 DIAGNOSIS — Z9481 Bone marrow transplant status: Secondary | ICD-10-CM

## 2015-12-15 LAB — CBC AND DIFFERENTIAL
Baso # K/uL: 0.1 10*3/uL (ref 0.0–0.1)
Basophil %: 0.9 %
Eos # K/uL: 0.7 10*3/uL — ABNORMAL HIGH (ref 0.0–0.4)
Eosinophil %: 12.7 %
Hematocrit: 38 % (ref 34–45)
Hemoglobin: 12 g/dL (ref 11.2–15.7)
IMM Granulocytes #: 0.1 10*3/uL (ref 0.0–0.1)
IMM Granulocytes: 1.8 %
Lymph # K/uL: 0.8 10*3/uL — ABNORMAL LOW (ref 1.2–3.7)
Lymphocyte %: 14.2 %
MCH: 31 pg/cell (ref 26–32)
MCHC: 32 g/dL (ref 32–36)
MCV: 97 fL — ABNORMAL HIGH (ref 79–95)
Mono # K/uL: 0.6 10*3/uL (ref 0.2–0.9)
Monocyte %: 10.4 %
Neut # K/uL: 3.3 10*3/uL (ref 1.6–6.1)
Nucl RBC # K/uL: 0 10*3/uL (ref 0.0–0.0)
Nucl RBC %: 0 /100 WBC (ref 0.0–0.2)
Platelets: 201 10*3/uL (ref 160–370)
RBC: 3.9 MIL/uL (ref 3.9–5.2)
RDW: 14.5 % — ABNORMAL HIGH (ref 11.7–14.4)
Seg Neut %: 60 %
WBC: 5.5 10*3/uL (ref 4.0–10.0)

## 2015-12-15 LAB — COMPREHENSIVE METABOLIC PANEL, PL
ALT, PL: 106 U/L — ABNORMAL HIGH (ref 0–35)
AST, PL: 59 U/L — ABNORMAL HIGH (ref 0–35)
Albumin, PL: 4.2 g/dL (ref 3.5–5.2)
Alk Phos, PL: 149 U/L — ABNORMAL HIGH (ref 35–105)
Anion Gap,PL: 10 (ref 7–16)
Bilirubin Total, PL: 0.6 mg/dL (ref 0.0–1.2)
CO2,Plasma: 29 mmol/L — ABNORMAL HIGH (ref 20–28)
Calcium, PL: 9.7 mg/dL (ref 8.6–10.2)
Chloride,Plasma: 98 mmol/L (ref 96–108)
Creatinine: 1.01 mg/dL — ABNORMAL HIGH (ref 0.51–0.95)
GFR,Black: 72 *
GFR,Caucasian: 63 *
Glucose,Plasma: 94 mg/dL (ref 60–99)
Potassium,Plasma: 5.2 mmol/L — ABNORMAL HIGH (ref 3.4–4.7)
Sodium,Plasma: 137 mmol/L (ref 133–145)
Total Protein, PL: 7.1 g/dL (ref 6.3–7.7)
UN,Plasma: 26 mg/dL — ABNORMAL HIGH (ref 6–20)

## 2015-12-15 LAB — IGG: IgG: 717 mg/dL (ref 700–1600)

## 2015-12-15 LAB — TYPE AND SCREEN
ABO RH Blood Type: A POS
Antibody Screen: NEGATIVE

## 2015-12-15 LAB — MAGNESIUM, PLASMA: Magnesium, PL: 1.7 mEq/L (ref 1.3–2.1)

## 2015-12-15 LAB — LD, PL: LD, PL: 191 U/L (ref 118–225)

## 2015-12-15 LAB — NEUTROPHIL #-INSTRUMENT: Neutrophil #-Instrument: 3.3 10*3/uL

## 2015-12-15 MED ORDER — INFLUENZA VAC SPLIT QUAD (FLULAVAL) 0.5 ML IM SUSY PF(>=6 MONTHS) *I*
0.5000 mL | PREFILLED_SYRINGE | Freq: Once | INTRAMUSCULAR | Status: AC
Start: 2015-12-15 — End: 2015-12-15
  Administered 2015-12-15: 0.5 mL via INTRAMUSCULAR

## 2015-12-15 NOTE — Progress Notes (Signed)
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   She has very dry mouth and her jaws hurt when she wakes up in the morning.   She has been having aches in both shoulders, right hip, and behind left knee.  Takes motrin for that; that is the only thing that relieves this.   She will be spending a couple months in [facility], and we reviewed that.   Occasional nausea- using Zofran  Denies diarrhea, vomiting, fever, URI s/sx  Encouraged flu shot, and she is amenable to that today.   Denies smoking  More active; looking forward to time in warm weather.   No palpitations and cardiology states she can stop the anticoagulation and start baby aspirin.     Other pertinent findings checked/documented below  _0  fevers  _1  palpitations _2  urinary frequency   _3  chills _4  chest pain _5  dysuria    _6  dry eyes _7  nausea _8  rash   _9  ear pain _10  abdominal cramping _11  skin changes   _12  dry mouth _13  abdominal discomfort _14  pain   _15  sore throat _16  loose stool, non-formed _17  fatigue   _18  cough  _19  diarrhea, watery _20  enlarged lymph nodes   _21  sputum production  _22  weight loss _23  bleeding   _24  shortness of breath _25  insufficient oral intake _26  none of the above     Medications  Reviewed medications with patient today & electronically reconciled  Current Outpatient Prescriptions on File Prior to Visit   Medication Sig Dispense Refill    metoprolol (LOPRESSOR) 50 MG tablet Take 1 tablet (50 mg total) by mouth 2 times daily 60 tablet 4    ursodiol (ACTIGALL) 300 MG capsule Take 1 capsule (300 mg total) by mouth 2 times daily   And as directed by MD/NP 90 capsule 6    ibuprofen (ADVIL,MOTRIN) 200 MG tablet Take 600 mg by mouth daily      dapsone 100 MG  tablet Take 1 tablet (100 mg total) by mouth daily for 30 days 30 tablet 6    Budesonide (ENTOCORT EC) 3 MG 24 hr capsule Take 1 capsule (3 mg total) by mouth 2 times daily (Patient taking differently: Take 3 mg by mouth every morning   ) 60 capsule 6    ondansetron (ZOFRAN) 4 MG tablet Take 1 tablet (4 mg total) by mouth 3 times daily as needed (nausea) 90 tablet 0    biotin 5 MG tablet Take 5 mg by mouth daily      tacrolimus (PROGRAF) 1 MG capsule Take 1 mg by mouth 2 times daily   1 mg BID as of 10/12      pantoprazole (PROTONIX) 40 MG EC tablet Take 1 tablet (40 mg total) by mouth 2 times daily   Swallow whole. Do not crush, break, or chew. 60 tablet 5    MAGNESIUM PO Take 2 tablets by mouth 2 times daily         acyclovir (ZOVIRAX) 400 MG tablet Take 1 tablet (400 mg total) by mouth 2 times daily 120 tablet 5    fluconazole (DIFLUCAN) 200 MG tablet Take 1 tablet (200 mg total) by mouth nightly 120 tablet  5    escitalopram (LEXAPRO) 10 MG tablet Take 15 mg by mouth daily      prochlorperazine (COMPAZINE) 10 MG tablet Take 1 tablet (10 mg total) by mouth 4 times daily as needed for Nausea 40 tablet 6     No current facility-administered medications on file prior to visit.        Recent labs  Reviewed, see below    VS, weight, KPS  BP 132/80 (BP Location: Left arm, Patient Position: Sitting, Cuff Size: large adult)   Pulse 63   Temp 35.6 C (96.1 F) (Temporal)    Resp 18   Wt 110.4 kg (243 lb 6.2 oz)   SpO2 97%   BMI 39.28 kg/m2    Wt Readings from Last 3 Encounters:   12/15/15 110.4 kg (243 lb 6.2 oz)   12/07/15 108.9 kg (240 lb)   11/29/15 108.7 kg (239 lb 12 oz)       KPS%: 90    Physical Exam     General Appearance:  Mental status  No acute distress, non-toxic appearing; came with mother  Normal affect, speech, dress, motor activity   Head:  Normocephalic, atraumatic   Eyes:  Conjunctiva/corneas clear, no eye drainage   Oral/Throat: Lips, mucosa, and tongue moist, tongue is coated.   no  postpharyngeal exudate; no lichenoid changes   Neck: Supple, normal turgor   Heart: S1, S2 normal, regular rhythm, no murmur, rub or gallop   Lungs:   Respirations unlabored, clear to A&P auscultation bilaterally   Abdomen:   Soft, non-tender, bowel sounds active all four quadrants,   tympanic, no masses, no organomegaly   Extremities: No edema   Pulses: 2+ and symmetric   Skin: No rashes or lesion       Recent Results (from the past 24 hour(s))   CBC and differential    Collection Time: 12/15/15 10:05 AM   Result Value Ref Range    WBC 5.5 4.0 - 10.0 THOU/uL    RBC 3.9 3.9 - 5.2 MIL/uL    Hemoglobin 12.0 11.2 - 15.7 g/dL    Hematocrit 38 34 - 45 %    MCV 97 (H) 79 - 95 fL    MCH 31 26 - 32 pg/cell    MCHC 32 32 - 36 g/dL    RDW 14.5 (H) 11.7 - 14.4 %    Platelets 201 160 - 370 THOU/uL    Seg Neut % 60.0 %    Lymphocyte % 14.2 %    Monocyte % 10.4 %    Eosinophil % 12.7 %    Basophil % 0.9 %    Neut # K/uL 3.3 1.6 - 6.1 THOU/uL    Lymph # K/uL 0.8 (L) 1.2 - 3.7 THOU/uL    Mono # K/uL 0.6 0.2 - 0.9 THOU/uL    Eos # K/uL 0.7 (H) 0.0 - 0.4 THOU/uL    Baso # K/uL 0.1 0.0 - 0.1 THOU/uL    Nucl RBC % 0.0 0.0 - 0.2 /100 WBC    Nucl RBC # K/uL 0.0 0.0 - 0.0 THOU/uL    IMM Granulocytes # 0.1 0.0 - 0.1 THOU/uL    IMM Granulocytes 1.8 %   IgG    Collection Time: 12/15/15 10:05 AM   Result Value Ref Range    IgG 717 700 - 1600 mg/dL   Type and screen    Collection Time: 12/15/15 10:05 AM   Result Value Ref Range    ABO RH  Blood Type A RH POS     Antibody Screen Negative    Comprehensive Metabolic Panel, PL    Collection Time: 12/15/15 10:05 AM   Result Value Ref Range    Potassium,Plasma 5.2 (H) 3.4 - 4.7 mmol/L    Sodium,Plasma 137 133 - 145 mmol/L    Anion Gap,PL 10 7 - 16    UN,Plasma 26 (H) 6 - 20 mg/dL    Creatinine 1.01 (H) 0.51 - 0.95 mg/dL    GFR,Caucasian 63 *    GFR,Black 72 *    Glucose,Plasma 94 60 - 99 mg/dL    Calcium, PL 9.7 8.6 - 10.2 mg/dL    Chloride,Plasma 98 96 - 108 mmol/L    CO2,Plasma 29 (H) 20 - 28  mmol/L    Alk Phos, PL 149 (H) 35 - 105 U/L    AST, PL 59 (H) 0 - 35 U/L    ALT, PL 106 (H) 0 - 35 U/L    Albumin, PL 4.2 3.5 - 5.2 g/dL    Bilirubin Total, PL 0.6 0.0 - 1.2 mg/dL    Total Protein, PL 7.1 6.3 - 7.7 g/dL   LD, PL    Collection Time: 12/15/15 10:05 AM   Result Value Ref Range    LD, PL 191 118 - 225 U/L   Magnesium, Plasma    Collection Time: 12/15/15 10:05 AM   Result Value Ref Range    Magnesium, PL 1.7 1.3 - 2.1 mEq/L   Neutrophil #-Instrument    Collection Time: 12/15/15 10:05 AM   Result Value Ref Range    Neutrophil #-Instrument 3.3 THOU/uL       Assessment and Plan  Carly Higgins is a 55 yo female with unclassifiable myeloproliferative neoplasm and completed Decitabine 30m/m2 IV daily x 5 days - Cycle # 1 12/15/14 and had been on Hydrea prior to transplant. 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     The patient came to clinic today for routine follow up.    Atypical MPN S/P URD allo, day +212  Day 30 marrow (6/6) hypercellular marrow, no evidence of malignancy, chimerism 2% recipient  Day 100 BMbx (09/15/15) Normochromic normocytic anemia. Mild lymphopenia. Mildly hypercellular bone marrow with trilineage hematopoiesis. Chimerism 2% recipient.    Heme  Engrafted  Counts are adequate; no signs of disease recurrence.     GVHD  Full dose MTX given day 1,3,6. Day 11 mtx held due to elevated bilirubin and mucositis  Tacro taper  Tacro dose: 1 mg BID; will leave at current dose given dry mouth, mild eosinophilia and LFT changes in past.   Budesonide- now daily  No signs of GVHD today, occasional nausea and recent elevation of LFTs, monitor.     ID  Hx Cholecystitis, pneumonia 05/2015  Immunocompromised  CMV PCR not required both negative    Continue acyclovir and fluconazole ppx  Cont Dapsone 100 mg/day for PCP ppx (unable to tolerate Bactrim due to hyperkalemia)    If IgG <400 or clinical  indication, will consider IVIG; she has not needed this.    IgG 769 on 11/9    Flu shot today.   No signs of infection today    CV/Pulm  Hx A-flutter  Cont Metoprolol 50 mg BID  Followed by Dr. MKathi Der Cardiology  Off cardizem, stopped by Dr. MKathi Der Has oxygen at home, but has not needed it  Possible OSA, needs sleep study at some point soon  Will stop DOAC and she can start EC 81 mg ASA.     Acute KI on CKD r/t tacrolimus- resolving  Creatinine 0.95 today, stable  Encouraged PO intake  Cont oral magnesium supplementation.     Hyperkalemia r/t medication and acute KI (tacro)  Off florinef  Monitor K     GI  Cont protonix twice daily   Zofran prn   LFTs much improved, monitor.     Smoking cessation  Has been able to abstain thus     Diffuse bone aches possibly due to arthritis; no left knee swelling; will further image if this persists and check ANA, RF, etc.   She was told to walk frequently on drive to [facility].   She will continue topical therapies for the mouth dryness; no signs of GVHD on exam at present.     Follow up  BMT clinic: every 2 weeks; she has one appt before [facility] visit. Will give her printed lab reqs then for use in [facility].   Bloodwork frequency: labs prior    BMT [name]  Referring [name]  Lives in [place]    Cheron Schaumann, MD

## 2015-12-26 ENCOUNTER — Other Ambulatory Visit: Payer: Self-pay | Admitting: Hematology

## 2015-12-26 DIAGNOSIS — R110 Nausea: Secondary | ICD-10-CM

## 2015-12-26 DIAGNOSIS — Z9481 Bone marrow transplant status: Secondary | ICD-10-CM

## 2015-12-26 DIAGNOSIS — D471 Chronic myeloproliferative disease: Secondary | ICD-10-CM

## 2015-12-26 MED ORDER — BUDESONIDE 3 MG PO CPEP *I*
3.0000 mg | DELAYED_RELEASE_CAPSULE | Freq: Every morning | ORAL | 3 refills | Status: DC
Start: 2015-12-26 — End: 2015-12-29

## 2015-12-26 MED ORDER — FLUCONAZOLE 200 MG PO TABS *I*
200.0000 mg | ORAL_TABLET | Freq: Every evening | ORAL | 3 refills | Status: DC
Start: 2015-12-26 — End: 2016-06-26

## 2015-12-26 MED ORDER — ACYCLOVIR 400 MG PO TABS *I*
400.0000 mg | ORAL_TABLET | Freq: Two times a day (BID) | ORAL | 3 refills | Status: DC
Start: 2015-12-26 — End: 2016-03-29

## 2015-12-29 ENCOUNTER — Ambulatory Visit: Payer: Self-pay | Admitting: Oncology

## 2015-12-29 ENCOUNTER — Other Ambulatory Visit
Admission: RE | Admit: 2015-12-29 | Discharge: 2015-12-29 | Disposition: A | Discharge status: Home / Self Care | Payer: Self-pay | Source: Ambulatory Visit | Attending: Oncology | Admitting: Oncology

## 2015-12-29 ENCOUNTER — Encounter: Payer: Self-pay | Admitting: Oncology

## 2015-12-29 ENCOUNTER — Ambulatory Visit: Payer: Self-pay

## 2015-12-29 VITALS — BP 134/87 | HR 74 | Temp 96.6°F | Resp 18 | Wt 247.7 lb

## 2015-12-29 DIAGNOSIS — Z9481 Bone marrow transplant status: Secondary | ICD-10-CM

## 2015-12-29 DIAGNOSIS — K219 Gastro-esophageal reflux disease without esophagitis: Secondary | ICD-10-CM

## 2015-12-29 DIAGNOSIS — D849 Immunodeficiency, unspecified: Secondary | ICD-10-CM

## 2015-12-29 DIAGNOSIS — D471 Chronic myeloproliferative disease: Secondary | ICD-10-CM

## 2015-12-29 DIAGNOSIS — E875 Hyperkalemia: Secondary | ICD-10-CM

## 2015-12-29 DIAGNOSIS — R110 Nausea: Secondary | ICD-10-CM

## 2015-12-29 LAB — LD, PL: LD, PL: 201 U/L (ref 118–225)

## 2015-12-29 LAB — CBC AND DIFFERENTIAL
Baso # K/uL: 0.1 10*3/uL (ref 0.0–0.1)
Basophil %: 0.9 %
Eos # K/uL: 0.7 10*3/uL — ABNORMAL HIGH (ref 0.0–0.4)
Eosinophil %: 11.3 %
Hematocrit: 39 % (ref 34–45)
Hemoglobin: 12.4 g/dL (ref 11.2–15.7)
IMM Granulocytes #: 0.1 10*3/uL (ref 0.0–0.1)
IMM Granulocytes: 2.1 %
Lymph # K/uL: 0.8 10*3/uL — ABNORMAL LOW (ref 1.2–3.7)
Lymphocyte %: 13.2 %
MCH: 31 pg/cell (ref 26–32)
MCHC: 32 g/dL (ref 32–36)
MCV: 96 fL — ABNORMAL HIGH (ref 79–95)
Mono # K/uL: 0.6 10*3/uL (ref 0.2–0.9)
Monocyte %: 11.1 %
Neut # K/uL: 3.5 10*3/uL (ref 1.6–6.1)
Nucl RBC # K/uL: 0 10*3/uL (ref 0.0–0.0)
Nucl RBC %: 0 /100 WBC (ref 0.0–0.2)
Platelets: 185 10*3/uL (ref 160–370)
RBC: 4 MIL/uL (ref 3.9–5.2)
RDW: 14.2 % (ref 11.7–14.4)
Seg Neut %: 61.4 %
WBC: 5.7 10*3/uL (ref 4.0–10.0)

## 2015-12-29 LAB — COMPREHENSIVE METABOLIC PANEL, PL
ALT, PL: 64 U/L — ABNORMAL HIGH (ref 0–35)
AST, PL: 33 U/L (ref 0–35)
Albumin, PL: 4 g/dL (ref 3.5–5.2)
Alk Phos, PL: 118 U/L — ABNORMAL HIGH (ref 35–105)
Anion Gap,PL: 11 (ref 7–16)
Bilirubin Total, PL: 0.5 mg/dL (ref 0.0–1.2)
CO2,Plasma: 27 mmol/L (ref 20–28)
Calcium, PL: 9.8 mg/dL (ref 8.6–10.2)
Chloride,Plasma: 98 mmol/L (ref 96–108)
Creatinine: 0.82 mg/dL (ref 0.51–0.95)
GFR,Black: 93 *
GFR,Caucasian: 80 *
Glucose,Plasma: 101 mg/dL — ABNORMAL HIGH (ref 60–99)
Potassium,Plasma: 4.9 mmol/L — ABNORMAL HIGH (ref 3.4–4.7)
Sodium,Plasma: 136 mmol/L (ref 133–145)
Total Protein, PL: 7.1 g/dL (ref 6.3–7.7)
UN,Plasma: 18 mg/dL (ref 6–20)

## 2015-12-29 LAB — TYPE AND SCREEN
ABO RH Blood Type: A POS
Antibody Screen: NEGATIVE

## 2015-12-29 LAB — MAGNESIUM, PLASMA: Magnesium, PL: 1.6 mEq/L (ref 1.3–2.1)

## 2015-12-29 LAB — NEUTROPHIL #-INSTRUMENT: Neutrophil #-Instrument: 3.5 10*3/uL

## 2015-12-29 MED ORDER — ONDANSETRON HCL 4 MG PO TABS *I*
4.0000 mg | ORAL_TABLET | Freq: Three times a day (TID) | ORAL | 3 refills | Status: DC | PRN
Start: 2015-12-29 — End: 2016-10-04

## 2015-12-29 MED ORDER — TACROLIMUS 1 MG PO CAPS *I*
1.0000 mg | ORAL_CAPSULE | Freq: Two times a day (BID) | ORAL | 6 refills | Status: DC
Start: 2015-12-29 — End: 2016-03-01

## 2015-12-29 MED ORDER — DAPSONE 100 MG PO TABS *I*
100.0000 mg | ORAL_TABLET | Freq: Every day | ORAL | 6 refills | Status: DC
Start: 2015-12-29 — End: 2016-03-01

## 2015-12-29 MED ORDER — PANTOPRAZOLE SODIUM 40 MG PO TBEC *I*
40.0000 mg | DELAYED_RELEASE_TABLET | Freq: Two times a day (BID) | ORAL | 3 refills | Status: DC
Start: 2015-12-29 — End: 2016-06-25

## 2015-12-29 NOTE — Progress Notes (Signed)
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    PMH, Oncology History & Problem List  Reviewed and updated as needed    Interval History/Review of Systems  Ariann is feeling well today  Shoulder/hip pain  Require ibuprofen = known arthritis  Planning to go to [place] for a month after X-mas  Will drive down with folks and fly back   Aware of need to avoid sun due to GVHD risk  Eating better, stopping Budesonide today   No diarrhea  No cough or SOB  No rash  mediport removed 11/2, site well healed     Other pertinent findings checked/documented below     A comprehensive review of 13 systems was performed; pertinent positives checked below:  []  fevers  []  fast pulse []  decreased appetite  []  weakness   []  chills []  chest pain []  altered taste  []  poor mobility    []  dry eyes []  nausea   []  insufficient oral intake  []  impaired ADLs   []  visual changes  []  emesis  [x]  skin changes alopecia []  insomnia    []  headache  []  abdominal cramping  []  rash  []  leg edema better   []  sores in mouth []  abdominal discomfort []  urinary frequency []     []  sore tongue  []  diarrhea, watery []  dysuria and hematuria  []  anxiety    []  cough   []  loose stool, non-formed []  bleeding  []  poor coping    []  sputum production  []  constipation   [x]  pain shoulder/hip joints []  neuropathy   []  shortness of breath/DOE   []   weight loss  []  fatigue  []  none of the above      Patient Active Problem List   Diagnosis Code    MPN (myeloproliferative neoplasm) D47.1    Myeloproliferative disease D47.1    post allogenic MUD (F)  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 Z94.81    Immunocompromised D84.9    Hypomagnesemia E83.42    Hyperkalemia E87.5    Shoulder pain, bilateral M25.511, M25.512    Nausea R11.0    Hip pain M25.559     Elevated liver enzymes R74.8       Medications  Reviewed medications with patient today & electronically reconciled  Current Outpatient Prescriptions   Medication Sig Note    dapsone 100 MG tablet Take 1 tablet (100 mg total) by mouth daily     tacrolimus (PROGRAF) 1 MG capsule Take 1 capsule (1 mg total) by mouth 2 times daily   And as directed by MD/NP     pantoprazole (PROTONIX) 40 MG EC tablet Take 1 tablet (40 mg total) by mouth 2 times daily   Swallow whole. Do not crush, break, or chew.     acyclovir (ZOVIRAX) 400 MG tablet Take 1 tablet (400 mg total) by mouth 2 times daily     fluconazole (DIFLUCAN) 200 MG tablet Take 1 tablet (200 mg total) by mouth nightly     metoprolol (LOPRESSOR) 50 MG tablet Take 1 tablet (50 mg total) by mouth 2 times daily     ursodiol (ACTIGALL) 300 MG capsule Take 1 capsule (300 mg total) by mouth 2 times daily   And as  directed by MD/NP 01/03/2016: Stopped 12/30/15    ibuprofen (ADVIL,MOTRIN) 200 MG tablet Take 600 mg by mouth 3 times daily as needed for Pain        biotin 5 MG tablet Take 5 mg by mouth daily     MAGNESIUM PO Take 1 tablet by mouth 2 times daily        escitalopram (LEXAPRO) 10 MG tablet Take 15 mg by mouth daily     ondansetron (ZOFRAN) 4 MG tablet Take 1 tablet (4 mg total) by mouth 3 times daily as needed (nausea)     aspirin 81 MG EC tablet Take 81 mg by mouth daily     prochlorperazine (COMPAZINE) 10 MG tablet Take 1 tablet (10 mg total) by mouth 4 times daily as needed for Nausea      No current facility-administered medications for this visit.          Recent labs - reviewed    Recent Results (from the past 72 hour(s))   CBC and differential    Collection Time: 12/29/15  9:32 AM   Result Value Ref Range    WBC 5.7 4.0 - 10.0 THOU/uL    RBC 4.0 3.9 - 5.2 MIL/uL    Hemoglobin 12.4 11.2 - 15.7 g/dL    Hematocrit 39 34 - 45 %    MCV 96 (H) 79 - 95 fL    MCH 31 26 - 32 pg/cell    MCHC 32 32 - 36 g/dL    RDW 14.2 11.7 - 14.4 %    Platelets 185  160 - 370 THOU/uL    Seg Neut % 61.4 %    Lymphocyte % 13.2 %    Monocyte % 11.1 %    Eosinophil % 11.3 %    Basophil % 0.9 %    Neut # K/uL 3.5 1.6 - 6.1 THOU/uL    Lymph # K/uL 0.8 (L) 1.2 - 3.7 THOU/uL    Mono # K/uL 0.6 0.2 - 0.9 THOU/uL    Eos # K/uL 0.7 (H) 0.0 - 0.4 THOU/uL    Baso # K/uL 0.1 0.0 - 0.1 THOU/uL    Nucl RBC % 0.0 0.0 - 0.2 /100 WBC    Nucl RBC # K/uL 0.0 0.0 - 0.0 THOU/uL    IMM Granulocytes # 0.1 0.0 - 0.1 THOU/uL    IMM Granulocytes 2.1 %   Neutrophil #-Instrument    Collection Time: 12/29/15  9:32 AM   Result Value Ref Range    Neutrophil #-Instrument 3.5 THOU/uL       Wt Readings from Last 3 Encounters:   12/29/15 112.4 kg (247 lb 11 oz)   12/15/15 110.4 kg (243 lb 6.2 oz)   12/07/15 108.9 kg (240 lb)     Radiology impressions (last 3 days):  No results found.    Physical Exam   BP 134/87 (BP Location: Left arm, Patient Position: Sitting, Cuff Size: large adult)   Pulse 74   Temp 35.9 C (96.6 F) (Temporal)    Resp 18   Wt 112.4 kg (247 lb 11 oz)   SpO2 97%   BMI 39.98 kg/m2   KPS  80%  Pain 0/10   No acute distress, non-toxic appearing, cooperative with exam   Appropriate affect and dress with normal speech and motor activity  scalp atraumatic, hair growing back   PERRLA- EOMI. Conjunctiva & corneas clear, no eye drainage. Sclera anicteric OU  Lips, oral mucosa and tongue moist, without lesions;  no postpharyngeal exudate    Neck is supple, no rigidity   Lymph nodes: No cervical, submandibular, supraclavicular, infraclavicular, axillary, palpated  Normal S1 & S2, regular, no murmur, rub, or gallop  Respirations unlabored, lung sounds clear to A&P bilateral auscultation  Bowel sounds are normoactive in four quadrants; abdomen is soft, non-tender, and without masses or organomegaly  Radial pulses +2 and symmetric, CMS is intact  Trace non pitting bilat lower extremity edema, limited mobility with shoulder hip pain   Neuro AOx3, speech/language WNL, moving all extremities, pain noted in hips.   Non-focal exam  Skin warm dry intact no lesions, No rash mild hyperpigmentation fading  Old mediport incision site clean dry - healed (removed 11/2.)    Assessment   Pleasant  55 yo female with MPN post allogeneic MUD (F) PBSCT, here for follow up. No clinical signs of infection or GVHD. On IST taper, Tacro dose 1 mg BID, no change this visit . LFTs (enzymes) noted be higher today after pt left, considering actigall and liver US. No abd pain.     Plan  MPN post HSCT  Regimen: Fludarabine 40 mg/m2/d and Busulfan 130 mg/m2/d x 4 days   Transplant type: Allogeneic MUD PBSC 10/10 55 yr old female ABO : A+/O\A+. CMV titer:N/N  Transplant day 0:  05/17/15  Transplant day:  + 226  Day 30 marrow 6/6 The hypercellularity and architectural pattern is somewhat unusual, but no definitive evidence of malignancy is seen. Chimerism results indicate only low level (2%) recipient DNA.   Day 100 marrow 09/15/15 chimerism still 2%, no MDS  chimerism 11/21 trace recipient  Check in January when pt returns from trip  1 yr marrow to be scheduled closer to date     Hematology:   Blood Counts stable   ANC 3500  PLT WNL  No signs of bleeding.  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  LFTs better today - stop actigal 12/22  Grade 0     ID:  Afebrile   No clinical signs of active infection  Immunocompromised  CMV PCR not required both negative  IgG >700 this month    May require IVIG in future, if <400 or clinical indication, will follow monthly   Cont ppx acyclovir    Cont ppx diflucan  PCP ppx dapsone     Re-immunizations at 1 yr post BMT as able to give     Cardiovascular/pulmonary:   BP WNL  HR stable   Cont  metoprolol for now   Cardiology follow up as needed  seeng later this month  Likely has OSA-  will eventually need testing    No leg edema     Doing well with nicotine cravings- off nicotine patches  Refer to smoking cessation clinic as needed   .   Renal/FEN:   Creat improved with Tacrolimus taper now WNL  Cont PO magneium -1 tab bid, cont to decrease with Tacro taper, level good  Hyperkalemia better with taper   Tapering Tacro- off florinef late Sept - levels stable  No dysuria or hematuria  Other lytes stable    Gastrointestinal:   Nausea better has PRN  medications as needed, not using    No diarrhea.   Cont protonix   Eating well and drinking well   Weight up  LFTs  transamitis - better - possible GVHD vs medication vs other etiology  Consider liver US if worsen     Endocrine:   Stable.   Eventually bone health meds and Dexa scan at 1 yr     Neurologic:   Nonfocal; no deficits.   No H/A or visual changes.   No neuro changes on Tacro   Shoulder pain with degenerative changes ibuprofen helpful  Hip pain seeing PCP for additional management    Musculoskeletal:   activity as tolerated   Walking more and doing more, may explain incease joint pain   Joint pain shoulders/hip - arthritis PCP following  Films degenerative changes    Derm  Skin intact  No rashes    Aware to limit sun exposure and use sun screen while in [place]     Psychosocial:   Family supportive.   Continue our support.   Traveling to [place] late December for 1 month   Labs at least once while there   .   BMT attending Lytle Butte   BMT coordinator Meyler   PCP Cahnhidalgo  Adjust plan of care as needed.     Follow up  BMT clinic every other week with labs   Tacro taper every 4 weeks - will delay further taper until back from [place], NP

## 2015-12-30 ENCOUNTER — Telehealth: Payer: Self-pay | Admitting: Oncology

## 2015-12-30 NOTE — Telephone Encounter (Signed)
Called pt instructed to stop ursodiol   Will hold on changing Tacro as going OOT for about 1 month   Will resume taper on return  Verbalized understanding     Irene Pap, NP

## 2016-02-20 ENCOUNTER — Telehealth: Payer: Self-pay | Admitting: Hematology

## 2016-02-20 NOTE — Telephone Encounter (Signed)
Spoke with Carly Higgins   appointment has been scheduled per request

## 2016-02-24 ENCOUNTER — Other Ambulatory Visit
Admission: RE | Admit: 2016-02-24 | Discharge: 2016-02-24 | Disposition: A | Discharge status: Home / Self Care | Payer: Self-pay | Source: Ambulatory Visit | Attending: Oncology | Admitting: Oncology

## 2016-02-24 ENCOUNTER — Ambulatory Visit: Payer: Self-pay | Admitting: Oncology

## 2016-02-24 VITALS — BP 123/72 | HR 96 | Temp 97.0°F | Resp 18 | Wt 252.8 lb

## 2016-02-24 DIAGNOSIS — M25531 Pain in right wrist: Secondary | ICD-10-CM

## 2016-02-24 DIAGNOSIS — Z9481 Bone marrow transplant status: Secondary | ICD-10-CM

## 2016-02-24 DIAGNOSIS — R0602 Shortness of breath: Secondary | ICD-10-CM

## 2016-02-24 DIAGNOSIS — R110 Nausea: Secondary | ICD-10-CM

## 2016-02-24 DIAGNOSIS — D471 Chronic myeloproliferative disease: Secondary | ICD-10-CM

## 2016-02-24 DIAGNOSIS — D849 Immunodeficiency, unspecified: Secondary | ICD-10-CM

## 2016-02-24 DIAGNOSIS — M25562 Pain in left knee: Secondary | ICD-10-CM

## 2016-02-24 LAB — COMPREHENSIVE METABOLIC PANEL, PL
ALT, PL: 36 U/L — ABNORMAL HIGH (ref 0–35)
AST, PL: 30 U/L (ref 0–35)
Albumin, PL: 4 g/dL (ref 3.5–5.2)
Alk Phos, PL: 128 U/L — ABNORMAL HIGH (ref 35–105)
Anion Gap,PL: 12 (ref 7–16)
Bilirubin Total, PL: 0.5 mg/dL (ref 0.0–1.2)
CO2,Plasma: 26 mmol/L (ref 20–28)
Calcium, PL: 9.3 mg/dL (ref 8.6–10.2)
Chloride,Plasma: 102 mmol/L (ref 96–108)
Creatinine: 1.02 mg/dL — ABNORMAL HIGH (ref 0.51–0.95)
GFR,Black: 71 *
GFR,Caucasian: 62 *
Glucose,Plasma: 127 mg/dL — ABNORMAL HIGH (ref 60–99)
Potassium,Plasma: 4.6 mmol/L (ref 3.4–4.7)
Sodium,Plasma: 140 mmol/L (ref 133–145)
Total Protein, PL: 6.7 g/dL (ref 6.3–7.7)
UN,Plasma: 21 mg/dL — ABNORMAL HIGH (ref 6–20)

## 2016-02-24 LAB — CBC AND DIFFERENTIAL
Baso # K/uL: 0 10*3/uL (ref 0.0–0.1)
Basophil %: 0.7 %
Eos # K/uL: 0.6 10*3/uL — ABNORMAL HIGH (ref 0.0–0.4)
Eosinophil %: 10.1 %
Hematocrit: 33 % — ABNORMAL LOW (ref 34–45)
Hemoglobin: 10.6 g/dL — ABNORMAL LOW (ref 11.2–15.7)
IMM Granulocytes #: 0.1 10*3/uL (ref 0.0–0.1)
IMM Granulocytes: 1.7 %
Lymph # K/uL: 0.8 10*3/uL — ABNORMAL LOW (ref 1.2–3.7)
Lymphocyte %: 14.5 %
MCH: 32 pg/cell (ref 26–32)
MCHC: 32 g/dL (ref 32–36)
MCV: 99 fL — ABNORMAL HIGH (ref 79–95)
Mono # K/uL: 0.5 10*3/uL (ref 0.2–0.9)
Monocyte %: 8.6 %
Neut # K/uL: 3.7 10*3/uL (ref 1.6–6.1)
Nucl RBC # K/uL: 0 10*3/uL (ref 0.0–0.0)
Nucl RBC %: 0 /100 WBC (ref 0.0–0.2)
Platelets: 206 10*3/uL (ref 160–370)
RBC: 3.3 MIL/uL — ABNORMAL LOW (ref 3.9–5.2)
RDW: 13.7 % (ref 11.7–14.4)
Seg Neut %: 64.4 %
WBC: 5.7 10*3/uL (ref 4.0–10.0)

## 2016-02-24 LAB — IGG: IgG: 569 mg/dL — ABNORMAL LOW (ref 700–1600)

## 2016-02-24 LAB — TYPE AND SCREEN
ABO RH Blood Type: A POS
Antibody Screen: NEGATIVE

## 2016-02-24 LAB — NEUTROPHIL #-INSTRUMENT: Neutrophil #-Instrument: 3.7 10*3/uL

## 2016-02-24 LAB — MAGNESIUM, PLASMA: Magnesium, PL: 1.3 mEq/L (ref 1.3–2.1)

## 2016-02-24 LAB — LD, PL: LD, PL: 233 U/L — ABNORMAL HIGH (ref 118–225)

## 2016-02-24 NOTE — Patient Instructions (Addendum)
February 2018   Sunday Monday Tuesday Wednesday Thursday Friday Saturday                       1     2     3       4     5     6     7     8     9     10       11     12     13     14     15     16   17       18     19  ULTRASOUND  7AM   [address]    20     21     22     23     24       25     26     27     04 April 2016   Sunday Monday Tuesday Wednesday Thursday Friday Saturday                       1     2     FOLLOW UP     2:30 PM 3       4     5     6     7     8     9     10       11     12     13     14     15     16     17       18     19     20     21  pft 1030  [facility] elev[facility]   Main lobby 3rd floor    22     23     24       25     26     27     28     29     30     31 

## 2016-02-24 NOTE — Progress Notes (Signed)
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    PMH, Oncology History & Problem List  Reviewed    Interval History/Review of Systems  Reports pain from elbow to wrist bilaterally, with Right hand middle 3 fingers having tingling- Ortho consult  Difficulty rising up from a chair with her knees  Pain behind the Left knee- LLE dopp ordered today   Increased SOB with activity - ordered PFTs  Reports nausea w/pizza twice in past 2 weeks  Very fatigued per pt more so lately      Other pertinent findings checked/documented below  _0  fevers  _1  palpitations _2  urinary frequency   _3  chills _4  chest pain _5  dysuria    _6  dry eyes _7  nausea _8  rash   _9  ear pain _10  abdominal cramping _11  skin changes   _12  dry mouth _13  abdominal discomfort _14  pain   _15  sore throat _16  loose stool, non-formed _17  fatigue   _18  cough  _19  diarrhea, watery _20  enlarged lymph nodes   _21  sputum production  _22  weight loss _23  bleeding   _24  shortness of breath _25  insufficient oral intake _26  none of the above     Medications  Reviewed medications with patient today & electronically reconciled  Current Outpatient Prescriptions on File Prior to Visit   Medication Sig Dispense Refill    dapsone 100 MG tablet Take 1 tablet (100 mg total) by mouth daily 30 tablet 6    ondansetron (ZOFRAN) 4 MG tablet Take 1 tablet (4 mg total) by mouth 3 times daily as needed (nausea) 60 tablet 3    tacrolimus (PROGRAF) 1 MG capsule Take 1 capsule (1 mg total) by mouth 2 times daily   And as directed by MD/NP 120 capsule 6    pantoprazole (PROTONIX) 40 MG EC tablet Take 1 tablet (40 mg total) by mouth 2 times daily   Swallow whole. Do not crush, break, or chew. 60 tablet 3    acyclovir (ZOVIRAX) 400 MG tablet Take 1 tablet (400 mg total) by mouth 2 times daily 180 tablet 3    fluconazole (DIFLUCAN) 200 MG tablet Take 1 tablet (200 mg total) by mouth nightly 90 tablet 3     aspirin 81 MG EC tablet Take 81 mg by mouth daily      metoprolol (LOPRESSOR) 50 MG tablet Take 1 tablet (50 mg total) by mouth 2 times daily 60 tablet 4    biotin 5 MG tablet Take 5 mg by mouth daily      MAGNESIUM PO Take 1 tablet by mouth 2 times daily         escitalopram (LEXAPRO) 10 MG tablet Take 15 mg by mouth daily      ursodiol (ACTIGALL) 300 MG capsule Take 1 capsule (300 mg total) by mouth 2 times daily   And as directed by MD/NP 90 capsule 6    ibuprofen (ADVIL,MOTRIN) 200 MG tablet Take 600 mg by mouth 3 times daily as needed for Pain         prochlorperazine (COMPAZINE) 10 MG tablet Take 1 tablet (10 mg total) by mouth 4 times daily as needed for Nausea 40 tablet 6     No current facility-administered medications on file prior to visit.        Recent labs  Reviewed, see below    VS, weight, KPS  BP 123/72 (BP Location: Left arm, Patient Position: Sitting, Cuff  Size: large adult)   Pulse 96   Temp 36.1 C (97 F) (Temporal)    Resp 18   Wt 114.7 kg (252 lb 12.1 oz)   SpO2 98%   BMI 40.8 kg/m2    Wt Readings from Last 3 Encounters:   02/24/16 114.7 kg (252 lb 12.1 oz)   12/29/15 112.4 kg (247 lb 11 oz)   12/15/15 110.4 kg (243 lb 6.2 oz)       KPS%: 90    Physical Exam     General Appearance:  Mental status  No acute distress, non-toxic appearing; came with mother  Normal affect, speech, dress, motor activity   Head:  Normocephalic, atraumatic   Eyes:  Conjunctiva/corneas clear, no eye drainage   Oral/Throat: Lips, mucosa, and tongue moist,   no postpharyngeal exudate   Neck: Supple, normal turgor   Heart: S1, S2 normal, regular rhythm, no murmur, rub or gallop   Lungs:   Respirations unlabored, clear to A&P auscultation bilaterally   Abdomen:   Soft, non-tender, bowel sounds active all four quadrants,   tympanic, no masses, no organomegaly   Extremities: No edema   Pulses: 2+ and symmetric   Skin: No rashes or lesion       Recent Results (from the past 24 hour(s))   CBC and differential     Collection Time: 02/24/16  2:53 PM   Result Value Ref Range    WBC 5.7 4.0 - 10.0 THOU/uL    RBC 3.3 (L) 3.9 - 5.2 MIL/uL    Hemoglobin 10.6 (L) 11.2 - 15.7 g/dL    Hematocrit 33 (L) 34 - 45 %    MCV 99 (H) 79 - 95 fL    MCH 32 26 - 32 pg/cell    MCHC 32 32 - 36 g/dL    RDW 13.7 11.7 - 14.4 %    Platelets 206 160 - 370 THOU/uL    Seg Neut % 64.4 %    Lymphocyte % 14.5 %    Monocyte % 8.6 %    Eosinophil % 10.1 %    Basophil % 0.7 %    Neut # K/uL 3.7 1.6 - 6.1 THOU/uL    Lymph # K/uL 0.8 (L) 1.2 - 3.7 THOU/uL    Mono # K/uL 0.5 0.2 - 0.9 THOU/uL    Eos # K/uL 0.6 (H) 0.0 - 0.4 THOU/uL    Baso # K/uL 0.0 0.0 - 0.1 THOU/uL    Nucl RBC % 0.0 0.0 - 0.2 /100 WBC    Nucl RBC # K/uL 0.0 0.0 - 0.0 THOU/uL    IMM Granulocytes # 0.1 0.0 - 0.1 THOU/uL    IMM Granulocytes 1.7 %   IgG    Collection Time: 02/24/16  2:53 PM   Result Value Ref Range    IgG 569 (L) 700 - 1600 mg/dL   Type and screen    Collection Time: 02/24/16  2:53 PM   Result Value Ref Range    ABO RH Blood Type A RH POS     Antibody Screen Negative    Comprehensive Metabolic Panel, PL    Collection Time: 02/24/16  2:53 PM   Result Value Ref Range    Potassium,Plasma 4.6 3.4 - 4.7 mmol/L    Sodium,Plasma 140 133 - 145 mmol/L    Anion Gap,PL 12 7 - 16    UN,Plasma 21 (H) 6 - 20 mg/dL    Creatinine 1.02 (H) 0.51 - 0.95 mg/dL  GFR,Caucasian 62 *    GFR,Black 71 *    Glucose,Plasma 127 (H) 60 - 99 mg/dL    Calcium, PL 9.3 8.6 - 10.2 mg/dL    Chloride,Plasma 102 96 - 108 mmol/L    CO2,Plasma 26 20 - 28 mmol/L    Alk Phos, PL 128 (H) 35 - 105 U/L    AST, PL 30 0 - 35 U/L    ALT, PL 36 (H) 0 - 35 U/L    Albumin, PL 4.0 3.5 - 5.2 g/dL    Bilirubin Total, PL 0.5 0.0 - 1.2 mg/dL    Total Protein, PL 6.7 6.3 - 7.7 g/dL   LD, PL    Collection Time: 02/24/16  2:53 PM   Result Value Ref Range    LD, PL 233 (H) 118 - 225 U/L   Magnesium, Plasma    Collection Time: 02/24/16  2:53 PM   Result Value Ref Range    Magnesium, PL 1.3 1.3 - 2.1 mEq/L   Neutrophil #-Instrument     Collection Time: 02/24/16  2:53 PM   Result Value Ref Range    Neutrophil #-Instrument 3.7 THOU/uL       Assessment and Plan  Carly Higgins is a 56 yo female with unclassifiable myeloproliferative neoplasm and completed Decitabine 47m/m2 IV daily x 5 days - Cycle # 1 12/15/14 and had been on Hydrea prior to transplant. 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     The patient came to clinic today for routine follow up.    Atypical MPN S/P URD allo, day +283  Day 30 marrow (6/6) hypercellular marrow, no evidence of malignancy, chimerism 2% recipient  Day 100 BMbx (09/15/15) Normochromic normocytic anemia. Mild lymphopenia. Mildly hypercellular bone marrow with trilineage hematopoiesis. Chimerism 2% recipient.    Heme  Engrafted  Transfusion independent; remains anemic; this is no doubt multifactorial; will obtain w/u if worsens.  Should improve once tacrolimus is tapered.     GVHD  Full dose MTX given day 1,3,6. Day 11 mtx held due to elevated bilirubin and mucositis  Tacro taper  Tacro dose: 1 mg BID  Budesonide- now daily  No signs of GVHD today, occasional nausea and recent elevation of LFTs, monitor.     Increased SOB- ordered PFTs may need pulmonary referral     ID  Hx Cholecystitis, pneumonia 05/2015  Immunocompromised  CMV PCR not required both negative    Continue acyclovir and fluconazole ppx  Cont Dapsone 100 mg/day for PCP ppx (unable to tolerate Bactrim due to hyperkalemia)    If IgG <400 or clinical indication, will consider IVIG; she has not needed this.    IgG 769 on 11/9    Encouraged Flu shot  No signs of infection today    CV/Pulm  Hx A-flutter  Cont Metoprolol 50 mg BID  Followed by Dr. MKathi Der Cardiology  Off cardizem, stopped by Dr. MKathi Der Has oxygen at home, but has not needed it   Possible OSA, needs sleep study at some point soon    Acute KI on CKD r/t tacrolimus-  resolving  Creatinine 0.95 today, stable  Encouraged PO intake    Magnesium 1.6 today, oral magnesium supplement:4 tabs/day    Hyperkalemia r/t medication and acute KI (tacro)  Off florinef  Potassium 5.1 today, monitor    GI  Cont protonix twice daily   Zofran prn   LFTs  elevated- cont actigall   RUQ [place]; minimally heterogenous echotexture the liver w/o definite lesion. Punctuate echogenic foci are nonspecific and may represent granulomas. No gallstones or biliary dilatation.     LFTs much improved, monitor.     MSK  Pain behind L knee -  LLE dopp ordered- 2/21  Pain from elbow to wrist; with tingling of right middle fingers- Ortho consulted    Smoking cessation  Has been able to abstain thus far    Follow up  BMT clinic: every 2 weeks  Bloodwork frequency: labs prior    BMT [name]  Referring [name]  Lives in [place], NP

## 2016-02-27 ENCOUNTER — Ambulatory Visit
Admission: RE | Admit: 2016-02-27 | Discharge: 2016-02-27 | Disposition: A | Discharge status: Home / Self Care | Payer: Self-pay | Source: Ambulatory Visit | Attending: Gastroenterology | Admitting: Gastroenterology

## 2016-03-01 ENCOUNTER — Telehealth: Payer: Self-pay | Admitting: Hematology

## 2016-03-01 DIAGNOSIS — D849 Immunodeficiency, unspecified: Secondary | ICD-10-CM

## 2016-03-01 DIAGNOSIS — Z9481 Bone marrow transplant status: Secondary | ICD-10-CM

## 2016-03-01 MED ORDER — DAPSONE 100 MG PO TABS *I*
100.0000 mg | ORAL_TABLET | Freq: Every day | ORAL | 6 refills | Status: DC
Start: 2016-03-01 — End: 2016-07-05

## 2016-03-01 MED ORDER — TACROLIMUS 1 MG PO CAPS *I*
1.0000 mg | ORAL_CAPSULE | Freq: Two times a day (BID) | ORAL | 6 refills | Status: DC
Start: 2016-03-01 — End: 2016-06-07

## 2016-03-01 NOTE — Telephone Encounter (Signed)
Catalaya stopped Budesonide. GI symptoms have been stable since then. She was informed by someone looking at her med list that she was supposed to have stopped actigal also. She did not, is taking one tab twice daily. Should she stop now?  I discussed with Irene Pap FNP. Reviewed labs from 2/16. Per Lucy: Stop Actigal. We will follow her routine labs. Next draw will be March 2, with clinic visit.  Basya stated understanding.  She requested refills on Tacrolimus and Dapsone be sent to her local RX. Done.

## 2016-03-09 ENCOUNTER — Telehealth: Payer: Self-pay | Admitting: Hematology

## 2016-03-09 ENCOUNTER — Ambulatory Visit: Payer: Self-pay

## 2016-03-09 NOTE — Telephone Encounter (Signed)
Noted.  Will ask scheduler to call patient to reschedule.

## 2016-03-09 NOTE — Telephone Encounter (Signed)
Spoke with Maha   Cancelled due to weather  appointment has been rescheduled for 03/12/16

## 2016-03-12 ENCOUNTER — Other Ambulatory Visit
Admission: RE | Admit: 2016-03-12 | Discharge: 2016-03-12 | Disposition: A | Discharge status: Home / Self Care | Payer: PRIVATE HEALTH INSURANCE | Source: Ambulatory Visit | Attending: Oncology | Admitting: Oncology

## 2016-03-12 ENCOUNTER — Ambulatory Visit: Payer: PRIVATE HEALTH INSURANCE | Attending: Internal Medicine | Admitting: Internal Medicine

## 2016-03-12 VITALS — BP 136/63 | HR 69 | Temp 96.3°F | Resp 18 | Wt 252.1 lb

## 2016-03-12 DIAGNOSIS — D471 Chronic myeloproliferative disease: Secondary | ICD-10-CM | POA: Insufficient documentation

## 2016-03-12 DIAGNOSIS — D849 Immunodeficiency, unspecified: Secondary | ICD-10-CM | POA: Insufficient documentation

## 2016-03-12 DIAGNOSIS — M2550 Pain in unspecified joint: Secondary | ICD-10-CM | POA: Insufficient documentation

## 2016-03-12 DIAGNOSIS — Z9481 Bone marrow transplant status: Secondary | ICD-10-CM

## 2016-03-12 DIAGNOSIS — D89813 Graft-versus-host disease, unspecified: Secondary | ICD-10-CM | POA: Insufficient documentation

## 2016-03-12 DIAGNOSIS — R682 Dry mouth, unspecified: Secondary | ICD-10-CM | POA: Insufficient documentation

## 2016-03-12 DIAGNOSIS — R7401 Elevation of levels of liver transaminase levels: Secondary | ICD-10-CM

## 2016-03-12 DIAGNOSIS — R740 Nonspecific elevation of levels of transaminase and lactic acid dehydrogenase [LDH]: Secondary | ICD-10-CM | POA: Insufficient documentation

## 2016-03-12 LAB — CBC AND DIFFERENTIAL
Baso # K/uL: 0 10*3/uL (ref 0.0–0.1)
Basophil %: 0.4 %
Eos # K/uL: 0.3 10*3/uL (ref 0.0–0.4)
Eosinophil %: 6.9 %
Hematocrit: 32 % — ABNORMAL LOW (ref 34–45)
Hemoglobin: 10.2 g/dL — ABNORMAL LOW (ref 11.2–15.7)
IMM Granulocytes #: 0.1 10*3/uL (ref 0.0–0.1)
IMM Granulocytes: 1 %
Lymph # K/uL: 0.8 10*3/uL — ABNORMAL LOW (ref 1.2–3.7)
Lymphocyte %: 16.4 %
MCH: 32 pg/cell (ref 26–32)
MCHC: 32 g/dL (ref 32–36)
MCV: 99 fL — ABNORMAL HIGH (ref 79–95)
Mono # K/uL: 0.4 10*3/uL (ref 0.2–0.9)
Monocyte %: 8.7 %
Neut # K/uL: 3.2 10*3/uL (ref 1.6–6.1)
Nucl RBC # K/uL: 0 10*3/uL (ref 0.0–0.0)
Nucl RBC %: 0 /100 WBC (ref 0.0–0.2)
Platelets: 182 10*3/uL (ref 160–370)
RBC: 3.2 MIL/uL — ABNORMAL LOW (ref 3.9–5.2)
RDW: 13.5 % (ref 11.7–14.4)
Seg Neut %: 66.6 %
WBC: 4.8 10*3/uL (ref 4.0–10.0)

## 2016-03-12 LAB — COMPREHENSIVE METABOLIC PANEL, PL
ALT, PL: 59 U/L — ABNORMAL HIGH (ref 0–35)
AST, PL: 36 U/L — ABNORMAL HIGH (ref 0–35)
Albumin, PL: 4.2 g/dL (ref 3.5–5.2)
Alk Phos, PL: 93 U/L (ref 35–105)
Anion Gap,PL: 12 (ref 7–16)
Bilirubin Total, PL: 0.4 mg/dL (ref 0.0–1.2)
CO2,Plasma: 26 mmol/L (ref 20–28)
Calcium, PL: 9.3 mg/dL (ref 8.6–10.2)
Chloride,Plasma: 101 mmol/L (ref 96–108)
Creatinine: 1.04 mg/dL — ABNORMAL HIGH (ref 0.51–0.95)
GFR,Black: 70 *
GFR,Caucasian: 60 *
Glucose,Plasma: 107 mg/dL — ABNORMAL HIGH (ref 60–99)
Potassium,Plasma: 4.8 mmol/L — ABNORMAL HIGH (ref 3.4–4.7)
Sodium,Plasma: 139 mmol/L (ref 133–145)
Total Protein, PL: 6.4 g/dL (ref 6.3–7.7)
UN,Plasma: 29 mg/dL — ABNORMAL HIGH (ref 6–20)

## 2016-03-12 LAB — LD, PL: LD, PL: 214 U/L (ref 118–225)

## 2016-03-12 LAB — MAGNESIUM, PLASMA: Magnesium, PL: 1.3 mEq/L (ref 1.3–2.1)

## 2016-03-12 LAB — TYPE AND SCREEN
ABO RH Blood Type: A POS
Antibody Screen: NEGATIVE

## 2016-03-12 LAB — NEUTROPHIL #-INSTRUMENT: Neutrophil #-Instrument: 3.2 10*3/uL

## 2016-03-12 MED ORDER — DEXAMETHASONE 0.05% CUSTOM COMPOUND ORAL RINSE *I*
5.0000 mg | Freq: Three times a day (TID) | ORAL | 0 refills | Status: DC
Start: 2016-03-12 — End: 2016-03-27

## 2016-03-12 MED ORDER — DEXAMETHASONE 0.05% CUSTOM COMPOUND ORAL RINSE *I*
5.0000 mg | Freq: Two times a day (BID) | ORAL | 0 refills | Status: DC
Start: 2016-03-12 — End: 2016-03-12

## 2016-03-12 NOTE — Student Note (Addendum)
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" and "no matter what I do it is still dry"- given Dexamethasone compound solution  SOB continues- PFT's ordered for 3/21  Eating and drinking well    Other pertinent findings checked/documented below  []  fevers  []  palpitations []  urinary frequency   []  chills []  chest pain []  dysuria    []  dry eyes []  nausea/emesis  []  rash   []  ear pain []  abdominal cramping []  skin changes   [x]  sores in mouth []  abdominal discomfort [x]  pain   []  sore throat []  loose stool, non-formed []  fatigue   []  cough  []  diarrhea, watery []  enlarged lymph nodes   []  sputum production  []  weight loss []  bleeding   []  shortness of breath []  insufficient oral intake []  none of the above     Medications  Reviewed medications with patient today & electronically reconciled  Current Outpatient Prescriptions on File Prior to Visit   Medication Sig Dispense Refill    tacrolimus (PROGRAF) 1 MG capsule Take 1 capsule (1 mg total) by mouth 2 times daily   And as directed by MD/NP 120 capsule 6    dapsone 100 MG tablet Take 1 tablet (100 mg total) by mouth daily 30 tablet 6    ondansetron (ZOFRAN) 4 MG tablet Take 1 tablet (4 mg total) by mouth 3 times daily as needed (nausea) 60 tablet 3    pantoprazole (PROTONIX) 40 MG EC tablet Take 1 tablet (40 mg total) by mouth 2 times daily   Swallow whole. Do not crush, break, or chew. 60 tablet 3    acyclovir (ZOVIRAX) 400 MG tablet Take 1 tablet (400 mg total) by mouth 2 times daily 180 tablet 3    fluconazole (DIFLUCAN) 200 MG tablet Take 1 tablet (200 mg total) by  mouth nightly 90 tablet 3    aspirin 81 MG EC tablet Take 81 mg by mouth daily      metoprolol (LOPRESSOR) 50 MG tablet Take 1 tablet (50 mg total) by mouth 2 times daily 60 tablet 4    ibuprofen (ADVIL,MOTRIN) 200 MG tablet Take 600 mg by mouth 3 times daily as needed for Pain         biotin 5 MG tablet Take 5 mg by mouth daily      MAGNESIUM PO Take 1 tablet by mouth 2 times daily         prochlorperazine (COMPAZINE) 10 MG tablet Take 1 tablet (10 mg total) by mouth 4 times daily as needed for Nausea 40 tablet 6    escitalopram (LEXAPRO) 10 MG tablet Take 15 mg by mouth daily       No current facility-administered medications on file prior to visit.        Other clinical data  Review of EPIC data was performed including Laboratory, Pathology, Radiology and clinical records.     VS, weight, KPS  BP 136/63 (BP Location: Left arm, Patient Position: Sitting, Cuff Size: large adult)   Pulse 69   Temp 35.7 C (96.3 F) (Temporal)  Resp 18   Wt 114.4 kg (252 lb 1.5 oz)   SpO2 96%   BMI 40.69 kg/m2    Wt Readings from Last 3 Encounters:   03/12/16 114.4 kg (252 lb 1.5 oz)   02/24/16 114.7 kg (252 lb 12.1 oz)   12/29/15 112.4 kg (247 lb 11 oz)       KPS%: 90%    Physical Exam   General Appearance:  Mental status  No acute distress, non-toxic appearing  Normal affect, speech, dress, motor activity   Head:  Normocephalic, atraumatic   Eyes:  Conjunctiva/corneas clear, no eye drainage   Oral/Throat: Lips, mucosa, and tongue dry, mucoceles,   no postpharyngeal exudate   Neck: Supple, normal turgor   Heart: S1, S2 normal, regular rhythm, no murmur, rub or gallop   Lungs:   Respirations unlabored, clear to A&P auscultation bilaterally   Abdomen:   Soft, non-tender, bowel sounds active all four quadrants, tympanic, no masses, no organomegaly   Extremities: No edema   Pulses: 2+ and symmetric   Skin: No rashes or lesions             Assessment and Plan  Carly Higgins is a 56 yo female with unclassifiable  myeloproliferative neoplasm and completed Decitabine 34m/m2 IV daily x 5 days - Cycle # 1 12/15/14 and had been on Hydrea prior to transplant. 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     The patient came to clinic today for routine follow up.    Atypical MPN S/P URD allo, day +300  Day 30 marrow (6/6) hypercellular marrow, no evidence of malignancy, chimerism 2% recipient  Day 100 BMbx (09/15/15) Normochromic normocytic anemia. Mild lymphopenia. Mildly hypercellular bone marrow with trilineage hematopoiesis. Chimerism 2% recipient.    Heme  Engrafted  Transfusion independent; remains anemic; this is no doubt multifactorial; will obtain w/u if worsens.  Should improve once tacrolimus is tapered.     GVHD  Full dose MTX given day 1,3,6. Day 11 mtx held due to elevated bilirubin and mucositis  Tacro taper  Tacro dose: 1 mg BID  No signs of GVHD today, occasional nausea and recent elevation of LFTs, monitor.     Increased dry mouth with mucoceles- Dexamethasone oral compound solution ordered  Increased SOB - PFTs ordered for 3/21    ID  Hx Cholecystitis, pneumonia 05/2015  Immunocompromised  CMV PCR not required both negative     Continue acyclovir and fluconazole ppx  Cont Dapsone 100 mg/day for PCP ppx (unable to tolerate Bactrim due to hyperkalemia)    If IgG <400 or clinical indication, will consider IVIG; she has not needed this.    IgG 769 on 11/9    Encouraged Flu shot  No signs of infection today    CV/Pulm  Hx A-flutter  Cont Metoprolol 50 mg BID  Followed by Dr. MKathi Der Cardiology  Off cardizem, stopped by Dr. MKathi Der Has oxygen at home, but has not needed it   Possible OSA, needs sleep study at some point soon    Acute KI on CKD r/t tacrolimus- resolving  Creatinine 1.04 today, stable  Encouraged PO intake    Magnesium 1.6 today, oral magnesium supplement:4 tabs/day    Hyperkalemia  r/t medication and acute KI (tacro)  Off florinef  Potassium 4.8 today, monitor    GI  Cont protonix twice daily   Zofran prn   LFTs  continue to be elevated, unclear if this is GVHD  RUQ [place]; minimally heterogenous echotexture the liver w/o definite lesion. Punctuate echogenic foci are nonspecific and may represent granulomas. No gallstones or biliary dilatation.       MSK  Pain behind L knee -  LLE dopp ordered- 2/21 NEG  Pain from elbow to wrist; with tingling of right middle fingers  Pain in bilateral knees with shooting sensation when attempting to stand- Ortho consulted    Smoking cessation   Has been able to abstain thus far    Follow up  BMT clinic: every 2 weeks  Bloodwork frequency: labs prior    BMT [name]  Referring [name]  Lives in [facility] Student NP    Patient seen and discussed with NP student agree with findings  Jarome Lamas, NP

## 2016-03-12 NOTE — Progress Notes (Signed)
Patient seen and discussed with NP student.  Agree with findings. See full note for documentation    J Jermika Olden FNP-C

## 2016-03-12 NOTE — Patient Instructions (Signed)
Decadron mouth rinse, swish and spit 3 times daily. Do not eat or drink within 30 minutes AFTER using solution

## 2016-03-27 ENCOUNTER — Telehealth: Payer: Self-pay

## 2016-03-27 ENCOUNTER — Other Ambulatory Visit
Admission: RE | Admit: 2016-03-27 | Discharge: 2016-03-27 | Disposition: A | Discharge status: Home / Self Care | Payer: PRIVATE HEALTH INSURANCE | Source: Ambulatory Visit | Attending: Oncology | Admitting: Oncology

## 2016-03-27 ENCOUNTER — Ambulatory Visit: Payer: PRIVATE HEALTH INSURANCE | Admitting: Internal Medicine

## 2016-03-27 VITALS — BP 122/66 | HR 79 | Temp 96.3°F | Resp 18 | Wt 255.5 lb

## 2016-03-27 DIAGNOSIS — Z9481 Bone marrow transplant status: Secondary | ICD-10-CM | POA: Insufficient documentation

## 2016-03-27 DIAGNOSIS — D471 Chronic myeloproliferative disease: Secondary | ICD-10-CM

## 2016-03-27 DIAGNOSIS — R7401 Elevation of levels of liver transaminase levels: Secondary | ICD-10-CM

## 2016-03-27 DIAGNOSIS — D89813 Graft-versus-host disease, unspecified: Secondary | ICD-10-CM

## 2016-03-27 DIAGNOSIS — D849 Immunodeficiency, unspecified: Secondary | ICD-10-CM

## 2016-03-27 DIAGNOSIS — R682 Dry mouth, unspecified: Secondary | ICD-10-CM

## 2016-03-27 LAB — CBC AND DIFFERENTIAL
Baso # K/uL: 0 10*3/uL (ref 0.0–0.1)
Basophil %: 0.4 %
Eos # K/uL: 0.2 10*3/uL (ref 0.0–0.4)
Eosinophil %: 4.8 %
Hematocrit: 37 % (ref 34–45)
Hemoglobin: 11.3 g/dL (ref 11.2–15.7)
IMM Granulocytes #: 0.1 10*3/uL (ref 0.0–0.1)
IMM Granulocytes: 1.5 %
Lymph # K/uL: 0.8 10*3/uL — ABNORMAL LOW (ref 1.2–3.7)
Lymphocyte %: 16.1 %
MCH: 31 pg/cell (ref 26–32)
MCHC: 31 g/dL — ABNORMAL LOW (ref 32–36)
MCV: 100 fL — ABNORMAL HIGH (ref 79–95)
Mono # K/uL: 0.4 10*3/uL (ref 0.2–0.9)
Monocyte %: 9 %
Neut # K/uL: 3.3 10*3/uL (ref 1.6–6.1)
Nucl RBC # K/uL: 0 10*3/uL (ref 0.0–0.0)
Nucl RBC %: 0 /100 WBC (ref 0.0–0.2)
Platelets: 152 10*3/uL — ABNORMAL LOW (ref 160–370)
RBC: 3.7 MIL/uL — ABNORMAL LOW (ref 3.9–5.2)
RDW: 14.1 % (ref 11.7–14.4)
Seg Neut %: 68.2 %
WBC: 4.8 10*3/uL (ref 4.0–10.0)

## 2016-03-27 LAB — MAGNESIUM, PLASMA: Magnesium, PL: 1.3 mEq/L (ref 1.3–2.1)

## 2016-03-27 LAB — COMPREHENSIVE METABOLIC PANEL, PL
ALT, PL: 299 U/L — ABNORMAL HIGH (ref 0–35)
AST, PL: 67 U/L — ABNORMAL HIGH (ref 0–35)
Albumin, PL: 4.1 g/dL (ref 3.5–5.2)
Alk Phos, PL: 129 U/L — ABNORMAL HIGH (ref 35–105)
Anion Gap,PL: 12 (ref 7–16)
Bilirubin Total, PL: 0.5 mg/dL (ref 0.0–1.2)
CO2,Plasma: 27 mmol/L (ref 20–28)
Calcium, PL: 9.6 mg/dL (ref 8.6–10.2)
Chloride,Plasma: 99 mmol/L (ref 96–108)
Creatinine: 0.94 mg/dL (ref 0.51–0.95)
GFR,Black: 79 *
GFR,Caucasian: 68 *
Glucose,Plasma: 140 mg/dL — ABNORMAL HIGH (ref 60–99)
Potassium,Plasma: 4.9 mmol/L — ABNORMAL HIGH (ref 3.4–4.7)
Sodium,Plasma: 138 mmol/L (ref 133–145)
Total Protein, PL: 6.8 g/dL (ref 6.3–7.7)
UN,Plasma: 21 mg/dL — ABNORMAL HIGH (ref 6–20)

## 2016-03-27 LAB — IGG: IgG: 554 mg/dL — ABNORMAL LOW (ref 700–1600)

## 2016-03-27 LAB — TYPE AND SCREEN
ABO RH Blood Type: A POS
Antibody Screen: NEGATIVE

## 2016-03-27 LAB — NEUTROPHIL #-INSTRUMENT: Neutrophil #-Instrument: 3.3 10*3/uL

## 2016-03-27 LAB — LD, PL: LD, PL: 181 U/L (ref 118–225)

## 2016-03-27 MED ORDER — DEXAMETHASONE 0.05% CUSTOM COMPOUND ORAL RINSE *I*
5.0000 mg | Freq: Two times a day (BID) | ORAL | 2 refills | Status: DC
Start: 2016-03-27 — End: 2016-05-15

## 2016-03-27 NOTE — Patient Instructions (Signed)
DECREASE tacro to 1mg  in AM and 0.5mg  PM

## 2016-03-27 NOTE — Progress Notes (Signed)
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  Has PFTs scheduled tomorrow  - feels joint pain is improved  - heading to [place] x3-4 weeks with family, will be seen in BMT clinic after     Other pertinent findings checked/documented below  []  fevers  []  palpitations []  urinary frequency   []  chills []  chest pain []  dysuria    [x]  dry mouth []  nausea/emesis  []  rash   []  ear pain []  abdominal cramping []  skin changes   []  dry mouth []  abdominal discomfort []  pain   []  sore throat []  loose stool, non-formed []  fatigue   []  cough  []  diarrhea, watery []  enlarged lymph nodes   []  sputum production  []  weight loss []  bleeding   [x]  shortness of breath []  insufficient oral intake []  none of the above     Medications  Reviewed medications with patient today & electronically reconciled  Current Outpatient Prescriptions on File Prior to Visit   Medication Sig Dispense Refill    dapsone 100 MG tablet Take 1 tablet (100 mg total) by mouth daily 30 tablet 6    pantoprazole (PROTONIX) 40 MG EC tablet Take 1 tablet (40 mg total) by mouth 2 times daily   Swallow whole. Do not crush, break, or chew. 60 tablet 3    acyclovir (ZOVIRAX) 400 MG tablet Take 1 tablet (400 mg total) by mouth 2 times daily 180 tablet 3    fluconazole (DIFLUCAN) 200 MG tablet Take 1 tablet (200 mg total) by mouth nightly 90 tablet 3    aspirin 81 MG EC tablet Take 81 mg by mouth daily      metoprolol (LOPRESSOR) 50 MG tablet Take 1 tablet (50 mg total) by mouth 2 times daily 60 tablet 4    ibuprofen  (ADVIL,MOTRIN) 200 MG tablet Take 600 mg by mouth 3 times daily as needed for Pain         biotin 5 MG tablet Take 5 mg by mouth daily      MAGNESIUM PO Take 1 tablet by mouth 2 times daily         escitalopram (LEXAPRO) 10 MG tablet Take 15 mg by mouth daily      tacrolimus (PROGRAF) 1 MG capsule Take 1 capsule (1 mg total) by mouth 2 times daily   And as directed by MD/NP 120 capsule 6    ondansetron (ZOFRAN) 4 MG tablet Take 1 tablet (4 mg total) by mouth 3 times daily as needed (nausea) 60 tablet 3    prochlorperazine (COMPAZINE) 10 MG tablet Take 1 tablet (10 mg total) by mouth 4 times daily as needed for Nausea 40 tablet 6     No current facility-administered medications on file prior to visit.        Other clinical data  Review of EPIC data was performed including Laboratory, Pathology, Radiology and clinical records.     VS, weight, KPS  BP 122/66 (BP Location: Right arm, Patient  Position: Sitting, Cuff Size: adult)   Pulse 79   Temp 35.7 C (96.3 F) (Temporal)    Resp 18   Wt 115.9 kg (255 lb 8.2 oz)   SpO2 96%   BMI 41.24 kg/m2    Wt Readings from Last 3 Encounters:   03/27/16 115.9 kg (255 lb 8.2 oz)   03/12/16 114.4 kg (252 lb 1.5 oz)   02/24/16 114.7 kg (252 lb 12.1 oz)       KPS%: 80    Physical Exam   General Appearance:  Mental status  No acute distress, non-toxic appearing  Normal affect, speech, dress, motor activity   Head:  Normocephalic, atraumatic   Eyes:  Conjunctiva/corneas clear, no eye drainage   Oral/Throat: Lips, mucosa, and tongue dry,   no postpharyngeal exudate   Neck: Supple, normal turgor   Heart: S1, S2 normal, regular rhythm, no murmur, rub or gallop   Lungs:   Respirations unlabored, clear to A&P auscultation bilaterally   Abdomen:   Soft, non-tender, bowel sounds active all four quadrants, tympanic, no masses, no organomegaly   Extremities: No edema   Pulses: 2+ and symmetric   Skin: No rashes or lesions       Assessment and Plan  Carly Higgins is a 56 yo female with  unclassifiable myeloproliferative neoplasm and completed Decitabine 61m/m2 IV daily x 5 days - Cycle # 1 12/15/14 and had been on Hydrea prior to transplant. 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     The patient came to clinic today for routine follow up.    Atypical MPN S/P URD allo, day +315  Day 30 marrow (6/6) hypercellular marrow, no evidence of malignancy, chimerism 2% recipient  Day 100 BMbx (09/15/15) Normochromic normocytic anemia. Mild lymphopenia. Mildly hypercellular bone marrow with trilineage hematopoiesis. Chimerism 2% recipient.    Heme  Engrafted  Transfusion independent; remains anemic; multifactorial; obtain w/u if worsens. Should improve once tacrolimus is tapered.     GVHD  Full dose MTX given day 1,3,6. Day 11 mtx held due to elevated bilirubin and mucositis  Tacro taper on HOLD  Tacro dose: 1 mg BID, no taper given increasing LFTs  Has mild elevation in LFTs, dry mouth, these are stable and not worsening. Discussed hypervigilance during tacro taper and to contact BMT office if she notes any s/s of GVHD    Increased dry mouth with mucoceles- using dexamethasone oral compound solution intermittently; will pick up refill today   Increased SOB - PFTs ordered for 3/21    ID  Hx Cholecystitis, pneumonia 05/2015  Immunocompromised  CMV PCR not required both negative     Continue acyclovir and fluconazole ppx  Cont Dapsone 100 mg/day for PCP ppx (unable to tolerate Bactrim due to hyperkalemia)    If IgG <400 or clinical indication, will consider IVIG; she has not needed this.   IgG 769 on 11/9    CV/Pulm  Hx A-flutter  Cont Metoprolol 50 mg BID  Followed by Dr. MKathi Der Cardiology  Off cardizem, stopped by Dr. MKathi Der Has oxygen at home, but has not needed it   Possible OSA, needs sleep study at some point soon    Acute KI on CKD r/t tacrolimus- resolving  Creatinine  0.94  Encouraged PO intake    Magnesium 1.3, oral magnesium supplement:4 tabs/day    Hyperkalemia r/t medication and acute KI (tacro)  Off florinef  Potassium 4.8 today, monitor    GI  Cont protonix twice daily   Zofran prn   LFTs continue to be elevated, unclear if this is GVHD0  RUQ [place]; minimally heterogenous echotexture the liver w/o definite lesion. Punctuate echogenic foci are nonspecific and may represent granulomas. No gallstones or biliary dilatation.       MSK  Pain behind L knee - LLE dopp ordered- 2/21 NEG  Pain from elbow to wrist; with tingling of right middle fingers  Pain in bilateral knees with shooting sensation when attempting to stand- Ortho consulted    Smoking cessation   Has been able to abstain thus far    Follow up  BMT clinic: every 2 weeks  Bloodwork frequency: labs prior    BMT [name]  Referring [name]  Lives in [place]      Follow up  BMT clinic in 1 week(s), with blood work prior    Jarome Lamas, NP

## 2016-03-27 NOTE — Telephone Encounter (Signed)
Per Jarome Lamas NP: LFTs elevated today. DO NOT reduce Tacrolimus- remain on 1 mg twice daily. In addition, resume Actigal 300 mg twice daily.  I spoke to Carly Higgins. I explained the reason for the medication changes. She stated understanding. She has Actigal in her possession. She will call if she needs a refill. She will not reduce Tacrolimus dose.

## 2016-03-28 ENCOUNTER — Telehealth: Payer: Self-pay | Admitting: Hematology

## 2016-03-28 ENCOUNTER — Ambulatory Visit: Payer: PRIVATE HEALTH INSURANCE | Attending: Pulmonology

## 2016-03-28 DIAGNOSIS — M25562 Pain in left knee: Secondary | ICD-10-CM | POA: Insufficient documentation

## 2016-03-28 DIAGNOSIS — R0602 Shortness of breath: Secondary | ICD-10-CM | POA: Insufficient documentation

## 2016-03-28 MED ORDER — URSODIOL 300 MG PO CAPS *I*
300.0000 mg | ORAL_CAPSULE | Freq: Two times a day (BID) | ORAL | 4 refills | Status: DC
Start: 2016-03-28 — End: 2016-07-27

## 2016-03-28 NOTE — Telephone Encounter (Signed)
Bernell does not have Actigal. [facility].  She had difficulty getting the automated system at [facility] to recognize her script numbers for other refills. She will call the pharmacist and discuss in person.  If she needs refills sent from [place] she will call back.

## 2016-03-29 ENCOUNTER — Other Ambulatory Visit: Payer: Self-pay | Admitting: Hematology

## 2016-03-29 DIAGNOSIS — Z9481 Bone marrow transplant status: Secondary | ICD-10-CM

## 2016-03-29 MED ORDER — ACYCLOVIR 400 MG PO TABS *I*
400.0000 mg | ORAL_TABLET | Freq: Two times a day (BID) | ORAL | 3 refills | Status: DC
Start: 2016-03-29 — End: 2016-04-11

## 2016-03-29 MED ORDER — METOPROLOL TARTRATE 50 MG PO TABS *I*
50.0000 mg | ORAL_TABLET | Freq: Two times a day (BID) | ORAL | 4 refills | Status: DC
Start: 2016-03-29 — End: 2016-09-03

## 2016-03-29 NOTE — Telephone Encounter (Signed)
Sent!

## 2016-04-04 ENCOUNTER — Telehealth: Payer: Self-pay | Admitting: Hematology

## 2016-04-04 NOTE — Telephone Encounter (Signed)
A script was sent last month with 6 refills.  I reminded Kambria. She said the pharmacy does not have it.  [facility]. They have the script and will refill. The pharmacy staff said Suanne called asking for a new script so they were confused.  I updated Ariely.

## 2016-04-08 ENCOUNTER — Other Ambulatory Visit: Payer: Self-pay | Admitting: Oncology

## 2016-04-08 DIAGNOSIS — E875 Hyperkalemia: Secondary | ICD-10-CM

## 2016-04-08 DIAGNOSIS — T50905A Adverse effect of unspecified drugs, medicaments and biological substances, initial encounter: Secondary | ICD-10-CM

## 2016-04-08 DIAGNOSIS — Z9481 Bone marrow transplant status: Secondary | ICD-10-CM

## 2016-04-11 ENCOUNTER — Telehealth: Payer: Self-pay | Admitting: Hematology

## 2016-04-11 DIAGNOSIS — Z9481 Bone marrow transplant status: Secondary | ICD-10-CM

## 2016-04-11 MED ORDER — ACYCLOVIR 400 MG PO TABS *I*
400.0000 mg | ORAL_TABLET | Freq: Two times a day (BID) | ORAL | 0 refills | Status: DC
Start: 2016-04-11 — End: 2016-07-24

## 2016-04-11 NOTE — Telephone Encounter (Signed)
Carly Higgins is reporting increased nausea, somewhat responsive to Zofran. Denies vomiting and diarrhea. Her eyes and mouth are stable. Denies rash. He appetite is diminished and she is feeling extremely fatigued. She returns to [place] on Saturday afternoon.  I asked if she could have labs checked in her location and she said it will be very expensive- around$500 dollars and she cannot afford it.    I will ask Abbie Sons to call her with an appt to be seen in clinic on Monday or Tuesday of this coming week and she will have labs done at that time.  I explained that, as requested, we sent a script for 90 day supply of Acyclovir to her pharmacy here on March 22. Carly Higgins said she did not pick it up. I suggested that she could ask [facility] [place] [facility] and ask if they can transfer the script. We can also send a script for just enough acyclovir to cover her until she gets home.  Carly Higgins said she will discuss with her RX.

## 2016-04-16 ENCOUNTER — Ambulatory Visit: Payer: PRIVATE HEALTH INSURANCE | Attending: Oncology | Admitting: Oncology

## 2016-04-16 ENCOUNTER — Other Ambulatory Visit
Admission: RE | Admit: 2016-04-16 | Discharge: 2016-04-16 | Disposition: A | Discharge status: Home / Self Care | Payer: No Typology Code available for payment source | Source: Ambulatory Visit | Attending: Oncology | Admitting: Oncology

## 2016-04-16 VITALS — BP 139/80 | HR 98 | Temp 96.8°F | Resp 18 | Wt 253.5 lb

## 2016-04-16 DIAGNOSIS — Z9481 Bone marrow transplant status: Secondary | ICD-10-CM | POA: Insufficient documentation

## 2016-04-16 DIAGNOSIS — D471 Chronic myeloproliferative disease: Secondary | ICD-10-CM | POA: Insufficient documentation

## 2016-04-16 DIAGNOSIS — R5383 Other fatigue: Secondary | ICD-10-CM | POA: Insufficient documentation

## 2016-04-16 DIAGNOSIS — I4891 Unspecified atrial fibrillation: Secondary | ICD-10-CM | POA: Insufficient documentation

## 2016-04-16 DIAGNOSIS — D89813 Graft-versus-host disease, unspecified: Secondary | ICD-10-CM | POA: Insufficient documentation

## 2016-04-16 DIAGNOSIS — D849 Immunodeficiency, unspecified: Secondary | ICD-10-CM | POA: Insufficient documentation

## 2016-04-16 DIAGNOSIS — R110 Nausea: Secondary | ICD-10-CM | POA: Insufficient documentation

## 2016-04-16 LAB — IGG: IgG: 576 mg/dL — ABNORMAL LOW (ref 700–1600)

## 2016-04-16 LAB — CBC AND DIFFERENTIAL
Baso # K/uL: 0 10*3/uL (ref 0.0–0.1)
Basophil %: 0.6 %
Eos # K/uL: 0.1 10*3/uL (ref 0.0–0.4)
Eosinophil %: 2.4 %
Hematocrit: 36 % (ref 34–45)
Hemoglobin: 11.9 g/dL (ref 11.2–15.7)
IMM Granulocytes #: 0 10*3/uL (ref 0.0–0.1)
IMM Granulocytes: 0.7 %
Lymph # K/uL: 1.1 10*3/uL — ABNORMAL LOW (ref 1.2–3.7)
Lymphocyte %: 20.6 %
MCH: 31 pg/cell (ref 26–32)
MCHC: 33 g/dL (ref 32–36)
MCV: 96 fL — ABNORMAL HIGH (ref 79–95)
Mono # K/uL: 0.6 10*3/uL (ref 0.2–0.9)
Monocyte %: 10.3 %
Neut # K/uL: 3.5 10*3/uL (ref 1.6–6.1)
Nucl RBC # K/uL: 0 10*3/uL (ref 0.0–0.0)
Nucl RBC %: 0 /100 WBC (ref 0.0–0.2)
Platelets: 179 10*3/uL (ref 160–370)
RBC: 3.8 MIL/uL — ABNORMAL LOW (ref 3.9–5.2)
RDW: 13.4 % (ref 11.7–14.4)
Seg Neut %: 65.4 %
WBC: 5.4 10*3/uL (ref 4.0–10.0)

## 2016-04-16 LAB — COMPREHENSIVE METABOLIC PANEL, PL
ALT, PL: 28 U/L (ref 0–35)
AST, PL: 21 U/L (ref 0–35)
Albumin, PL: 4.3 g/dL (ref 3.5–5.2)
Alk Phos, PL: 121 U/L — ABNORMAL HIGH (ref 35–105)
Anion Gap,PL: 11 (ref 7–16)
Bilirubin Total, PL: 0.5 mg/dL (ref 0.0–1.2)
CO2,Plasma: 27 mmol/L (ref 20–28)
Calcium, PL: 9.6 mg/dL (ref 8.6–10.2)
Chloride,Plasma: 104 mmol/L (ref 96–108)
Creatinine: 1.13 mg/dL — ABNORMAL HIGH (ref 0.51–0.95)
GFR,Black: 63 *
GFR,Caucasian: 55 * — AB
Glucose,Plasma: 108 mg/dL — ABNORMAL HIGH (ref 60–99)
Potassium,Plasma: 5.3 mmol/L — ABNORMAL HIGH (ref 3.4–4.7)
Sodium,Plasma: 142 mmol/L (ref 133–145)
Total Protein, PL: 7 g/dL (ref 6.3–7.7)
UN,Plasma: 23 mg/dL — ABNORMAL HIGH (ref 6–20)

## 2016-04-16 LAB — MAGNESIUM, PLASMA: Magnesium, PL: 1.5 mEq/L (ref 1.3–2.1)

## 2016-04-16 LAB — NEUTROPHIL #-INSTRUMENT: Neutrophil #-Instrument: 3.5 10*3/uL

## 2016-04-16 LAB — LD, PL: LD, PL: 290 U/L — ABNORMAL HIGH (ref 118–225)

## 2016-04-16 MED ORDER — BUDESONIDE 3 MG PO CPEP *I*
3.0000 mg | DELAYED_RELEASE_CAPSULE | Freq: Two times a day (BID) | ORAL | 2 refills | Status: DC
Start: 2016-04-16 — End: 2016-07-09

## 2016-04-16 NOTE — Progress Notes (Signed)
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   Denies diarrhea, eating and drinking okay.  Reviewed labs- stable.   Encouraged melatonin for sleep  Started budesonide 3 mg BID  Encouraged Zofran every morning       Other pertinent findings checked/documented below  _0  fevers  _1  palpitations _2  urinary frequency   _3  chills _4  chest pain _5  dysuria    _6  dry mouth _7  nausea/emesis  _8  rash   _9  ear pain _10  abdominal cramping _11  skin changes   _12  dry mouth _13  abdominal discomfort _14  pain   _15  sore throat _16  loose stool, non-formed _17  fatigue   _18  cough  _19  diarrhea, watery _20  enlarged lymph nodes   _21  sputum production  _22  weight loss _23  bleeding   _24  shortness of breath _25  insufficient oral intake _26  none of the above     Medications  Reviewed medications with patient today & electronically reconciled  Current Outpatient Prescriptions on File Prior to Visit   Medication Sig Dispense Refill    acyclovir (ZOVIRAX) 400 MG tablet Take 1 tablet (400 mg total) by mouth 2 times daily 16 tablet 0    fludrocortisone (FLORINEF) 0.1 mg tablet TAKE 1 TABLET (0.1 MG TOTAL) BY MOUTH DAILY 30 tablet 2    metoprolol (LOPRESSOR) 50 MG tablet Take 1 tablet (50 mg total) by mouth 2 times daily 60 tablet 4    ursodiol (ACTIGALL) 300 MG capsule Take 1 capsule (300 mg total) by mouth 2 times daily 60 capsule 4    tacrolimus (PROGRAF) 1 MG capsule Take 1 capsule (1 mg total) by mouth 2 times daily   And as directed by MD/NP 120 capsule 6    dapsone 100 MG tablet Take 1 tablet (100 mg total) by mouth daily 30 tablet 6    ondansetron (ZOFRAN) 4 MG tablet Take 1 tablet (4 mg total) by mouth 3 times daily as needed  (nausea) 60 tablet 3    pantoprazole (PROTONIX) 40 MG EC tablet Take 1 tablet (40 mg total) by mouth 2 times daily   Swallow whole. Do not crush, break, or chew. 60 tablet 3    fluconazole (DIFLUCAN) 200 MG tablet Take 1 tablet (200 mg total) by mouth nightly 90 tablet 3    aspirin 81 MG EC tablet Take 81 mg by mouth daily      ibuprofen (ADVIL,MOTRIN) 200 MG tablet Take 600 mg by mouth 3 times daily as needed for Pain         biotin 5 MG tablet Take 5 mg by mouth daily      MAGNESIUM PO Take 1 tablet by mouth 2 times daily         escitalopram (LEXAPRO) 10 MG tablet Take 15 mg by mouth daily      dexamethasone 0.5 mg/mL in sterile water *compounded oral rinse* Swish and spit 10 mLs (5 mg total) every 12 hours 1000 mL 2    prochlorperazine (COMPAZINE) 10 MG tablet Take 1 tablet (10 mg total) by mouth 4 times daily as needed for Nausea 40 tablet 6     No current facility-administered medications on file  prior to visit.        Other clinical data  Review of EPIC data was performed including Laboratory, Pathology, Radiology and clinical records.     VS, weight, KPS  BP 139/80 (BP Location: Left arm, Patient Position: Sitting, Cuff Size: large adult)   Pulse 98   Temp 36 C (96.8 F) (Temporal)    Resp 18   Wt 115 kg (253 lb 8.5 oz)   SpO2 97%   BMI 40.92 kg/m2    Wt Readings from Last 3 Encounters:   04/16/16 115 kg (253 lb 8.5 oz)   03/27/16 115.9 kg (255 lb 8.2 oz)   03/12/16 114.4 kg (252 lb 1.5 oz)       KPS%: 80    Physical Exam   General Appearance:  Mental status  No acute distress, non-toxic appearing  Normal affect, speech, dress, motor activity   Head:  Normocephalic, atraumatic   Eyes:  Conjunctiva/corneas clear, no eye drainage   Oral/Throat: Lips, mucosa, and tongue dry,   no postpharyngeal exudate   Neck: Supple, normal turgor   Heart: S1, S2 normal, regular rhythm, no murmur, rub or gallop   Lungs:   Respirations unlabored, clear to A&P auscultation bilaterally   Abdomen:   Soft, non-tender,  bowel sounds active all four quadrants, tympanic, no masses, no organomegaly   Extremities: No edema   Pulses: 2+ and symmetric   Skin: No rashes or lesions       Assessment and Plan  Carly Higgins is a 56 yo female with unclassifiable myeloproliferative neoplasm and completed Decitabine 74m/m2 IV daily x 5 days - Cycle # 1 12/15/14 and had been on Hydrea prior to transplant. 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     The patient came to clinic today for routine follow up.    Atypical MPN S/P URD allo, day +335  Day 30 marrow (6/6) hypercellular marrow, no evidence of malignancy, chimerism 2% recipient  Day 100 BMbx (09/15/15) Normochromic normocytic anemia. Mild lymphopenia. Mildly hypercellular bone marrow with trilineage hematopoiesis. Chimerism 2% recipient.    Heme  Reviewed cbc, counts stable    GVHD  Full dose MTX given day 1,3,6. Day 11 mtx held due to elevated bilirubin and mucositis  Was on Tacro taper, but has been on hold since oral GVHD and elevated LFTs.  Oral GVHD stable, using dexamethasone rinse as needed  LFTs now WNL  Cont Tacro 1 mg BID    Nausea, dry heaves  Budesonide 3 mg BID started, eprescribed    Repeat PFTs completed last month- no changed from prior    ID  Hx Cholecystitis, pneumonia 05/2015  Immunocompromised  CMV PCR not required both negative     Continue acyclovir and fluconazole ppx  Cont Dapsone 100 mg/day for PCP ppx (unable to tolerate Bactrim due to hyperkalemia)    No signs of infection today    CV/Pulm  Hx A-flutter  Cont Metoprolol 50 mg BID  Followed by Dr. MKathi Der Cardiology  Possible OSA, needs sleep study at some point soon    Renal  Creatinine 1.13 today, mildly elevated  Encouraged PO intake    Magnesium 1.5, oral magnesium supplement 1 tab BID    Hyperkalemia r/t medication  Off florinef  Potassium 5.3, monitor    GI  Cont protonix twice  daily   Zofran prn   Encouraged morning dose of Zofran  Will try budesonide  LFTs now WNL  Consider discontinuing actigall      Insomnia  Encouraged melatonin    Smoking cessation   Has been able to abstain thus far    Follow up  BMT clinic: 2 weeks  Bloodwork frequency: labs prior    BMT [name]  Referring [name]  Lives in [place]      Follow up  BMT clinic in 1 week(s), with blood work prior    Thalia Party, NP

## 2016-04-16 NOTE — Patient Instructions (Addendum)
Melatonin for sleep  Zofran every morning before getting out to bed  Budesonide 3 mg twice daily  Call Thursday or Friday if not feeling better

## 2016-04-18 NOTE — Progress Notes (Signed)
BMT SOCIAL WORK NOTE    Contact:  [facility] [phone] (fax)    SW received disability papers from [employer] re: Ms. Christian.  The patient's chart was reviewed and the forms were provided to [name] for review and signature.  After the signature is received the forms will be faxed to [employer], and a copy of the form will be entered into the patient's medical record.    [facility] SOCIAL WORK:   [facility] Social Work Therapist, nutritional:     Social Work contact made?: No      Reason:  Patient already followed    Risk Factors:  Barriers to Care and Financial    Barriers to care:  Insurance    Social Work post assessment plan:  As needed follow-up  Referral:     Time spent on this encounter (in minutes):  [place], [place]  BMT/Leukemia Social Worker  [phone]

## 2016-04-30 ENCOUNTER — Ambulatory Visit: Payer: PRIVATE HEALTH INSURANCE | Admitting: Oncology

## 2016-04-30 ENCOUNTER — Other Ambulatory Visit
Admission: RE | Admit: 2016-04-30 | Discharge: 2016-04-30 | Disposition: A | Discharge status: Home / Self Care | Payer: No Typology Code available for payment source | Source: Ambulatory Visit | Attending: Oncology | Admitting: Oncology

## 2016-04-30 VITALS — BP 135/74 | HR 83 | Temp 95.7°F | Resp 17 | Ht 65.98 in | Wt 252.2 lb

## 2016-04-30 DIAGNOSIS — Z9481 Bone marrow transplant status: Secondary | ICD-10-CM

## 2016-04-30 DIAGNOSIS — D849 Immunodeficiency, unspecified: Secondary | ICD-10-CM

## 2016-04-30 DIAGNOSIS — D89813 Graft-versus-host disease, unspecified: Secondary | ICD-10-CM

## 2016-04-30 DIAGNOSIS — D471 Chronic myeloproliferative disease: Secondary | ICD-10-CM | POA: Insufficient documentation

## 2016-04-30 DIAGNOSIS — R5383 Other fatigue: Secondary | ICD-10-CM

## 2016-04-30 LAB — COMPREHENSIVE METABOLIC PANEL, PL
ALT, PL: 31 U/L (ref 0–35)
AST, PL: 24 U/L (ref 0–35)
Albumin, PL: 4.2 g/dL (ref 3.5–5.2)
Alk Phos, PL: 94 U/L (ref 35–105)
Anion Gap,PL: 13 (ref 7–16)
Bilirubin Total, PL: 0.4 mg/dL (ref 0.0–1.2)
CO2,Plasma: 25 mmol/L (ref 20–28)
Calcium, PL: 9.7 mg/dL (ref 8.6–10.2)
Chloride,Plasma: 100 mmol/L (ref 96–108)
Creatinine: 0.89 mg/dL (ref 0.51–0.95)
GFR,Black: 84 *
GFR,Caucasian: 73 *
Glucose,Plasma: 153 mg/dL — ABNORMAL HIGH (ref 60–99)
Potassium,Plasma: 4.5 mmol/L (ref 3.4–4.7)
Sodium,Plasma: 138 mmol/L (ref 133–145)
Total Protein, PL: 6.9 g/dL (ref 6.3–7.7)
UN,Plasma: 21 mg/dL — ABNORMAL HIGH (ref 6–20)

## 2016-04-30 LAB — CBC AND DIFFERENTIAL
Baso # K/uL: 0.1 10*3/uL (ref 0.0–0.1)
Basophil %: 0.7 %
Eos # K/uL: 0.2 10*3/uL (ref 0.0–0.4)
Eosinophil %: 3.1 %
Hematocrit: 36 % (ref 34–45)
Hemoglobin: 11.5 g/dL (ref 11.2–15.7)
IMM Granulocytes #: 0.1 10*3/uL (ref 0.0–0.1)
IMM Granulocytes: 0.9 %
Lymph # K/uL: 1.1 10*3/uL — ABNORMAL LOW (ref 1.2–3.7)
Lymphocyte %: 16.3 %
MCH: 31 pg/cell (ref 26–32)
MCHC: 32 g/dL (ref 32–36)
MCV: 97 fL — ABNORMAL HIGH (ref 79–95)
Mono # K/uL: 0.6 10*3/uL (ref 0.2–0.9)
Monocyte %: 9.2 %
Neut # K/uL: 4.8 10*3/uL (ref 1.6–6.1)
Nucl RBC # K/uL: 0 10*3/uL (ref 0.0–0.0)
Nucl RBC %: 0 /100 WBC (ref 0.0–0.2)
Platelets: 186 10*3/uL (ref 160–370)
RBC: 3.8 MIL/uL — ABNORMAL LOW (ref 3.9–5.2)
RDW: 14.1 % (ref 11.7–14.4)
Seg Neut %: 69.8 %
WBC: 6.8 10*3/uL (ref 4.0–10.0)

## 2016-04-30 LAB — LD, PL: LD, PL: 189 U/L (ref 118–225)

## 2016-04-30 LAB — MAGNESIUM, PLASMA: Magnesium, PL: 1.4 mEq/L (ref 1.3–2.1)

## 2016-04-30 LAB — NEUTROPHIL #-INSTRUMENT: Neutrophil #-Instrument: 4.8 10*3/uL

## 2016-04-30 NOTE — Progress Notes (Signed)
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    The patient came to clinic today for routine follow up    PMH & Problem List  Reviewed in EPIC    Interval History/Review of Systems  Feeling much better after starting budesonide BID- nausea and stomach upset has resolved   Fatigue plagues her and makes it difficult to accomplish things, she would like to go back to work but can't imagine it with the degree of fatigue she still experiencing  She had pain that began in her right ear stretched across her scalp/forehead to her left ear last 7-10 days then completely resolved on its own  Formed stools       Other pertinent findings checked/documented below  [] fevers  [] palpitations [] urinary frequency   [] chills [] chest pain [] dysuria    [] dry mouth [x] nausea/emesis  [] rash   [] ear pain [] abdominal cramping [] skin changes   [] dry mouth [] abdominal discomfort [] pain   [] sore throat [] loose stool, non-formed [x] fatigue   [] cough  [] diarrhea, watery [] enlarged lymph nodes   [] sputum production  [] weight loss [] bleeding   [x] shortness of breath [] insufficient oral intake [] none of the above     Medications  Reviewed medications with patient today & electronically reconciled  Current Outpatient Prescriptions on File Prior to Visit   Medication Sig Dispense Refill    Budesonide (ENTOCORT EC) 3 MG 24 hr capsule Take 1 capsule (3 mg total) by mouth 2 times daily 60 capsule 2    acyclovir (ZOVIRAX) 400 MG tablet Take 1 tablet (400 mg total) by mouth 2 times daily 16 tablet 0    fludrocortisone (FLORINEF) 0.1 mg tablet TAKE 1 TABLET (0.1 MG TOTAL) BY MOUTH DAILY 30 tablet 2    metoprolol (LOPRESSOR) 50 MG tablet Take 1 tablet (50 mg total) by mouth 2 times daily 60 tablet 4    ursodiol (ACTIGALL) 300 MG capsule Take 1 capsule (300 mg total) by mouth 2 times daily 60 capsule 4    tacrolimus (PROGRAF) 1  MG capsule Take 1 capsule (1 mg total) by mouth 2 times daily   And as directed by MD/NP 120 capsule 6    dapsone 100 MG tablet Take 1 tablet (100 mg total) by mouth daily 30 tablet 6    pantoprazole (PROTONIX) 40 MG EC tablet Take 1 tablet (40 mg total) by mouth 2 times daily   Swallow whole. Do not crush, break, or chew. 60 tablet 3    fluconazole (DIFLUCAN) 200 MG tablet Take 1 tablet (200 mg total) by mouth nightly 90 tablet 3    aspirin 81 MG EC tablet Take 81 mg by mouth daily      biotin 5 MG tablet Take 5 mg by mouth daily      MAGNESIUM PO Take 1 tablet by mouth 2 times daily         escitalopram (LEXAPRO) 10 MG tablet Take 15 mg by mouth daily      dexamethasone 0.5 mg/mL in sterile water *compounded oral rinse* Swish and spit 10 mLs (5 mg total) every 12 hours 1000 mL 2    ondansetron (ZOFRAN) 4 MG tablet Take 1 tablet (4 mg total) by mouth 3 times daily as needed (nausea) 60 tablet 3  ibuprofen (ADVIL,MOTRIN) 200 MG tablet Take 600 mg by mouth 3 times daily as needed for Pain         prochlorperazine (COMPAZINE) 10 MG tablet Take 1 tablet (10 mg total) by mouth 4 times daily as needed for Nausea 40 tablet 6     No current facility-administered medications on file prior to visit.        Other clinical data  Review of EPIC data was performed including Laboratory, Pathology, Radiology and clinical records.     VS, weight, KPS  BP 135/74 (BP Location: Left arm, Patient Position: Sitting, Cuff Size: adult)   Pulse 83   Temp 35.4 C (95.7 F) (Temporal)    Resp 17   Ht 167.6 cm (5' 5.98")   Wt 114.4 kg (252 lb 3.3 oz)   SpO2 97%   BMI 40.73 kg/m2    Wt Readings from Last 3 Encounters:   04/30/16 114.4 kg (252 lb 3.3 oz)   04/16/16 115 kg (253 lb 8.5 oz)   03/27/16 115.9 kg (255 lb 8.2 oz)       KPS%: 80    Physical Exam   General Appearance:  Mental status  No acute distress, non-toxic appearing  Normal affect, speech, dress, motor activity   Head:  Normocephalic, atraumatic   Eyes:   Conjunctiva/corneas clear, no eye drainage   Oral/Throat: Lips, mucosa, and tongue dry,   no postpharyngeal exudate   Neck: Supple, normal turgor   Heart: S1, S2 normal, regular rhythm, no murmur, rub or gallop   Lungs:   Respirations unlabored, clear to A&P auscultation bilaterally   Abdomen:   Soft, non-tender, bowel sounds active all four quadrants, tympanic, no masses, no organomegaly   Extremities: No edema   Pulses: 2+ and symmetric   Skin: No rashes or lesions       Assessment and Plan  Carly Higgins is a 56 yo female with unclassifiable myeloproliferative neoplasm and completed Decitabine 48m/m2 IV daily x 5 days - Cycle # 1 12/15/14 and had been on Hydrea prior to transplant. 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     The patient came to clinic today for routine follow up.    Atypical MPN S/P URD allo, day +349  Day 30 marrow (6/6) hypercellular marrow, no evidence of malignancy, chimerism 2% recipient  Day 100 BMbx (09/15/15) Normochromic normocytic anemia. Mild lymphopenia. Mildly hypercellular bone marrow with trilineage hematopoiesis. Chimerism 2% recipient.    1 yr BMbx scheduled on May 8th    Heme  Reviewed cbc, counts stable    GVHD  Full dose MTX given day 1,3,6. Day 11 mtx held due to elevated bilirubin and mucositis  Oral GVHD stable, using dexamethasone rinse as needed  LFTs now WNL  Resumed tapering Tacro dose to 1 mg qAM and 0.5 mg qPM on 4/23  Cont Budesonide 3 mg BID       Repeat PFTs completed last month- no changed from prior    ID  Hx Cholecystitis, pneumonia 05/2015  Immunocompromised  CMV PCR not required both negative     Continue acyclovir and fluconazole ppx  Cont Dapsone 100 mg/day for PCP ppx (unable to tolerate Bactrim due to hyperkalemia)    No signs of infection today    CV/Pulm  Hx A-flutter  Cont Metoprolol 50 mg BID  Followed by Dr. MKathi Der  Cardiology  Possible OSA, needs sleep  study at some point soon    Renal  Creatinine 0.89 today, mildly elevated  Encouraged PO intake    Magnesium 1.4, oral magnesium supplement 1 tab BID    Hyperkalemia r/t medication  Off florinef  Potassium 4.5, monitor    GI  Cont protonix twice daily   Zofran prn   Encouraged morning dose of Zofran  LFTs now WNL    Insomnia  Encouraged melatonin    Smoking cessation   Has been able to abstain thus far    Follow up  BMT clinic: 2 weeks  Bloodwork frequency: labs prior    BMT [name]  Referring [name]  Lives in [place]      Follow up  BMT clinic in 1 week(s), with blood work prior    [place], NP    Organ scoring of cGVHD    Performance status  KPS 100%  [x]  0=Asymptomatic, fully active, KPS 100%  [] 1=Symtomatic, fully ambulatory, restricted only in physically strenuous activity, KPS 80-90%  [] 2=Symptomatic, ambulatory, capable of self-care, >50% if waking hours out of bed, KPS  60-70%  []  3=Symptomatic, limited self care, >50% of waking hours in bed, KPS <60%    Skin  [] Maculopapular rash  [] Lichen planus-like features  [] Papulosquamous lesions or icthyosis  [] Hyperpigmentation  [] Hypopigmentation  [] Keratosis pilaris  [] Erythema  [] Erythroderma  [] Poikiloderma  [] Sclerotic features  [] Pruritis  [] Hair involvement  [] Nail involvement    [x] 0=no symptoms  [] 1=<18% BSA, NO sclerotic features  [] 2=19-50% BSA OR superficial sclerotic features but NOT hidebound  [] 3=>50% BSA OR deep sclerosis/hidebound OR impaired mobility, ulceration, or severe pruritis    Mouth  [] 0=No symptoms  [x] 1=Mild symptoms with disease signs but NOT limiting oral intake significantly  [] 2=Moderate symptoms with disease WITH partial limitation of oral intake  [] 3=Severe symptoms with disease signs on examination WITH major limitation of oral intake    Eyes  [x] 0=No symptoms  [] 1=Mild dry eye symptoms not affecting ADL, requiring eyedrops <3x  per day, OR asymptomatic signs of keratoconjunctivitis sicca  [] 2=Moderate dry eye symptoms partially affecting ADL, requiring eyedrops >3x per day or punctal plugs, WITHOUT vision impairment  [] 3=Severe dry eye symptoms significantly affecting ADL (special eyewear to relieve pain), OR unable to work because of ocular symptoms, OR loss of vision caused by keratoconjunctivitis sicca    GI symptoms  [x] 0=No symptoms  [] 1=Symptoms such as dysphagia, anorexia, nausea, vomiting, abdominal pain, or diarrhea WITHOUT significant weight loss  [] 2=Symptoms associated with mild to moderate weight loss (5-15%)  [] 3=Symptoms associated with significant weight loss >15%, requires nutritional supplement for most calorie needs OR espohageal dilation    Liver  [x] 0=Normal LFT  [] 1=Elevated TB, AP, AST, or ALT less than 2x ULN (TB<2.68m/dl;AP<210U/L, AST &/or ALT<70U/L)  [] 2=TB>315mdL or TB, enzymes 2-5x ULN (TB 2.5-11m61ml; AP 211-525 U/L, AST &/or ALT 71-175U/L)  [] 3=Bilirubin or enzymes >5x ULN (TB>11mg17m AP>525U/L, AST &/or ALT>175U/L)    Lungs  [x] 0=No symptoms AND FEV1 >80%  [] 1=Mild symptoms (shortness of breath after climbing one flight of steps) AND FEV1 60-79%  [] 2=Moderate symptoms (shortness of breath after walking on flat ground) AND FEV1 40-59%  [] 3=Severe symptoms (shortness of breath at rest, requiring oxygen) AND FEV1<39%    Joints and fascia  [x] 0=No  symptoms  [] 1=Mild tightness of arms or legs, normal or mild decreased ROM and NOT affecting ADL  [] 2=Tightness of arms or legs OR joint contractures, erythema thought due to fasciitis, moderate decrease ROM AND moderate limitation of ADL  [] 3=Contractures WITH significant decrease ROM AND significant limitation of ADL (unable to tie shoes, button shirts, dress self etc.)    Genital tract  [x] 0=No symptoms or female  [] 1=Symptomatic with mild signs on exam AND no effect on coitus and minimal discomfort with gynecologic exam  [] 2=Symptomatic with  moderate signs on exam AND no effect on coitus and mild dyspareunia or discomfort with gynecologic exam  [] 3=Symptomatic with advanced signs (stricture, labial agglutination or severe ulceration) AND severe pain with coitus or inability to insert vaginal speculum    Global scoring of cGVHD  In the absence of histologic or clinical signs or symptoms of chronic GVHD, the persistence, recurrence, or new onset of characteristic skin, GI tract, or liver abnormalities should be classified as acute GVHD regardless of the time after transplantation.    Note that the global scoring system can be applied only AFTER the diagnosis of chronic GVHD is confirmed by either (1) the presence of a diagnostic feature, or, if a diagnostic feature is not present, (2) at least 1 distinctive manifestation of chronic GVHD with the diagnosis supported by histologic, radiologic, or laboratory evidence of GVHD from any site.    [] No cGVHD     [x]  Mild chronic GVHD involves  only 1 or 2 organs or sites (except the lung: see below), with no clinically significant functional impairment (maximum of score 1 in all affected organs or sites)       []  Moderate chronic GVHD involves  (1) at least 1 organ or site with clinically significant but no major disability (maximum score of 2 in any affected organ or site) or (2) 3 or more organs or sites with no clinically significant functional impairment (maximum score of 1 in all affected organs or sites). A lung score of 1 will also be considered moderate chronic GVHD.     []  Severe chronic GVHD indicates  major disability caused by chronic GVHD (score of 3 in any organ or site). A lung score of 2 or greater will also be considered severe chronic GVHD.

## 2016-04-30 NOTE — Patient Instructions (Signed)
Continue Budesonide twice a day    Decrease Tacrolimus 1 mg morning and 0.5 mg nightly    In 3-4 weeks being observing for any GVHD symptoms (nasuea, vomiting, diarrhea, new skin changes or rashes) please call [phone]

## 2016-05-03 ENCOUNTER — Other Ambulatory Visit
Admission: RE | Admit: 2016-05-03 | Discharge: 2016-05-03 | Disposition: A | Discharge status: Home / Self Care | Payer: PRIVATE HEALTH INSURANCE | Source: Ambulatory Visit | Attending: Obstetrics and Gynecology | Admitting: Obstetrics and Gynecology

## 2016-05-03 ENCOUNTER — Ambulatory Visit: Payer: PRIVATE HEALTH INSURANCE | Admitting: Hematology

## 2016-05-03 DIAGNOSIS — Z124 Encounter for screening for malignant neoplasm of cervix: Secondary | ICD-10-CM | POA: Insufficient documentation

## 2016-05-08 LAB — HPV DNA PROBE WITH CYTOLOGY
HPV Other High Risk: NEGATIVE
HPV Type 16: NEGATIVE
HPV Type 18: NEGATIVE

## 2016-05-10 LAB — GYN CYTOLOGY

## 2016-05-15 ENCOUNTER — Other Ambulatory Visit
Admission: RE | Admit: 2016-05-15 | Discharge: 2016-05-15 | Disposition: A | Discharge status: Home / Self Care | Payer: PRIVATE HEALTH INSURANCE | Source: Ambulatory Visit | Attending: Oncology | Admitting: Oncology

## 2016-05-15 ENCOUNTER — Ambulatory Visit: Payer: No Typology Code available for payment source | Attending: Oncology | Admitting: Oncology

## 2016-05-15 ENCOUNTER — Encounter: Payer: Self-pay | Admitting: Oncology

## 2016-05-15 ENCOUNTER — Ambulatory Visit: Payer: No Typology Code available for payment source | Admitting: Oncology

## 2016-05-15 VITALS — BP 117/61 | HR 62 | Temp 97.0°F | Resp 18 | Wt 248.8 lb

## 2016-05-15 DIAGNOSIS — D471 Chronic myeloproliferative disease: Secondary | ICD-10-CM

## 2016-05-15 DIAGNOSIS — Z9481 Bone marrow transplant status: Secondary | ICD-10-CM | POA: Insufficient documentation

## 2016-05-15 DIAGNOSIS — D849 Immunodeficiency, unspecified: Secondary | ICD-10-CM | POA: Insufficient documentation

## 2016-05-15 DIAGNOSIS — E875 Hyperkalemia: Secondary | ICD-10-CM

## 2016-05-15 DIAGNOSIS — M25551 Pain in right hip: Secondary | ICD-10-CM | POA: Insufficient documentation

## 2016-05-15 DIAGNOSIS — M25552 Pain in left hip: Secondary | ICD-10-CM | POA: Insufficient documentation

## 2016-05-15 LAB — CBC AND DIFFERENTIAL
Baso # K/uL: 0 10*3/uL (ref 0.0–0.1)
Basophil %: 0.6 %
Eos # K/uL: 0.1 10*3/uL (ref 0.0–0.4)
Eosinophil %: 1.7 %
Hematocrit: 39 % (ref 34–45)
Hemoglobin: 12.1 g/dL (ref 11.2–15.7)
IMM Granulocytes #: 0.1 10*3/uL (ref 0.0–0.1)
IMM Granulocytes: 1.1 %
Lymph # K/uL: 1.4 10*3/uL (ref 1.2–3.7)
Lymphocyte %: 21.5 %
MCH: 31 pg/cell (ref 26–32)
MCHC: 31 g/dL — ABNORMAL LOW (ref 32–36)
MCV: 99 fL — ABNORMAL HIGH (ref 79–95)
Mono # K/uL: 0.6 10*3/uL (ref 0.2–0.9)
Monocyte %: 9.8 %
Neut # K/uL: 4.2 10*3/uL (ref 1.6–6.1)
Nucl RBC # K/uL: 0 10*3/uL (ref 0.0–0.0)
Nucl RBC %: 0 /100 WBC (ref 0.0–0.2)
Platelets: 218 10*3/uL (ref 160–370)
RBC: 3.9 MIL/uL (ref 3.9–5.2)
RDW: 14.8 % — ABNORMAL HIGH (ref 11.7–14.4)
Seg Neut %: 65.3 %
WBC: 6.4 10*3/uL (ref 4.0–10.0)

## 2016-05-15 LAB — COMPREHENSIVE METABOLIC PANEL, PL
ALT, PL: 50 U/L — ABNORMAL HIGH (ref 0–35)
AST, PL: 28 U/L (ref 0–35)
Albumin, PL: 4.3 g/dL (ref 3.5–5.2)
Alk Phos, PL: 110 U/L — ABNORMAL HIGH (ref 35–105)
Anion Gap,PL: 10 (ref 7–16)
Bilirubin Total, PL: 0.5 mg/dL (ref 0.0–1.2)
CO2,Plasma: 29 mmol/L — ABNORMAL HIGH (ref 20–28)
Calcium, PL: 10.1 mg/dL (ref 8.6–10.2)
Chloride,Plasma: 102 mmol/L (ref 96–108)
Creatinine: 0.92 mg/dL (ref 0.51–0.95)
GFR,Black: 81 *
GFR,Caucasian: 70 *
Glucose,Plasma: 114 mg/dL — ABNORMAL HIGH (ref 60–99)
Potassium,Plasma: 5.2 mmol/L — ABNORMAL HIGH (ref 3.4–4.7)
Sodium,Plasma: 141 mmol/L (ref 133–145)
Total Protein, PL: 7.4 g/dL (ref 6.3–7.7)
UN,Plasma: 19 mg/dL (ref 6–20)

## 2016-05-15 LAB — NEUTROPHIL #-INSTRUMENT: Neutrophil #-Instrument: 4.2 10*3/uL

## 2016-05-15 LAB — MAGNESIUM, PLASMA: Magnesium, PL: 1.8 mEq/L (ref 1.3–2.1)

## 2016-05-15 LAB — LD, PL: LD, PL: 206 U/L (ref 118–225)

## 2016-05-15 NOTE — Progress Notes (Signed)
See Irene Pap, NP note.

## 2016-05-15 NOTE — Progress Notes (Signed)
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 GVHD    PMH, Problem List  Reviewed and updated as needed    Interval History/Review of Systems  Carly Higgins is feeling well today  Tolerated BMbx well   Shoulder/hip pain  Require ibuprofen for known arthritis  No fevers chills   No N/V or diarrhea no abd pain   No cough or SOB  No rash  No mouth sores or difficulty swalling  Dryness in mouth better   Active     Other pertinent findings checked/documented below     A comprehensive review of 13 systems was performed; pertinent positives checked below:  _0  fevers  _1  fast pulse _2  decreased appetite  _3  weakness   _4  chills _5  chest pain _6  altered taste  _7  poor mobility    _8  dry eyes _9  nausea   _10  insufficient oral intake  _11  impaired ADLs   _12  visual changes  _13  emesis  _14  skin changes alopecia _15  insomnia    _16  headache  _17  abdominal cramping  _18  rash  _19  leg edema better   _20  sores in mouth _21  abdominal discomfort _22  urinary frequency _23     _24  sore tongue  _25  diarrhea, watery _26  dysuria and hematuria  _27  anxiety    _28  cough   _29  loose stool, non-formed _30  bleeding  _31  poor coping    _32  sputum production  _33  constipation   _34  pain joints _35  neuropathy   _36  shortness of breath/DOE   _37   weight loss  _38  fatigue  _39  none of the above      Patient Active Problem List   Diagnosis Code    MPN (myeloproliferative neoplasm) D47.1    Myeloproliferative disease D47.1    post allogenic MUD (F)  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 Z94.81    Immunocompromised D84.9    Hyperkalemia E87.5    A-fib I48.91       Medications  Reviewed medications with patient today & electronically reconciled  Current Outpatient Prescriptions   Medication Sig Note    escitalopram (LEXAPRO) 20 MG tablet  Take 20 mg by mouth daily 05/15/2016: Received from: External Pharmacy Received Sig: TAKE 1 TABLETS ORALLY EVERY DAY    Budesonide (ENTOCORT EC) 3 MG 24 hr capsule Take 1 capsule (3 mg total) by mouth 2 times daily (Patient taking differently: Take 3 mg by mouth every morning   Tapered to daily on 5/8)     acyclovir (ZOVIRAX) 400 MG tablet Take 1 tablet (400 mg total) by mouth 2 times daily     fludrocortisone (FLORINEF) 0.1 mg tablet TAKE 1 TABLET (0.1 MG TOTAL) BY MOUTH DAILY     metoprolol (LOPRESSOR) 50 MG tablet Take 1 tablet (50 mg total) by mouth 2 times daily     ursodiol (ACTIGALL) 300 MG capsule Take 1 capsule (300 mg total) by mouth 2 times daily (Patient taking differently: Take 300 mg by mouth every morning   Tapered to daily on 5/8)     tacrolimus (PROGRAF) 1 MG capsule Take 1 capsule (1 mg total) by mouth 2 times daily   And as directed by MD/NP (Patient taking differently: Take by mouth   1 mg am  and 0.5 mg pm  Decrease to 0.5 mg BID on 5/8/17And as directed by MD/NP)     dapsone 100 MG tablet Take 1 tablet (100 mg total) by mouth daily     pantoprazole (PROTONIX) 40 MG EC tablet Take 1 tablet (40 mg total) by mouth 2 times daily   Swallow whole. Do not crush, break, or chew.     fluconazole (DIFLUCAN) 200 MG tablet Take 1 tablet (200 mg total) by mouth nightly     aspirin 81 MG EC tablet Take 81 mg by mouth daily     ibuprofen (ADVIL,MOTRIN) 200 MG tablet Take 600 mg by mouth 3 times daily as needed for Pain        biotin 5 MG tablet Take 5 mg by mouth daily     MAGNESIUM PO Take 1 tablet by mouth daily        ondansetron (ZOFRAN) 4 MG tablet Take 1 tablet (4 mg total) by mouth 3 times daily as needed (nausea)     prochlorperazine (COMPAZINE) 10 MG tablet Take 1 tablet (10 mg total) by mouth 4 times daily as needed for Nausea      No current facility-administered medications for this visit.          Recent labs - reviewed    Recent Results (from the past 72 hour(s))   CBC and  differential    Collection Time: 05/15/16  9:53 AM   Result Value Ref Range    WBC 6.4 4.0 - 10.0 THOU/uL    RBC 3.9 3.9 - 5.2 MIL/uL    Hemoglobin 12.1 11.2 - 15.7 g/dL    Hematocrit 39 34 - 45 %    MCV 99 (H) 79 - 95 fL    MCH 31 26 - 32 pg/cell    MCHC 31 (L) 32 - 36 g/dL    RDW 14.8 (H) 11.7 - 14.4 %    Platelets 218 160 - 370 THOU/uL    Seg Neut % 65.3 %    Lymphocyte % 21.5 %    Monocyte % 9.8 %    Eosinophil % 1.7 %    Basophil % 0.6 %    Neut # K/uL 4.2 1.6 - 6.1 THOU/uL    Lymph # K/uL 1.4 1.2 - 3.7 THOU/uL    Mono # K/uL 0.6 0.2 - 0.9 THOU/uL    Eos # K/uL 0.1 0.0 - 0.4 THOU/uL    Baso # K/uL 0.0 0.0 - 0.1 THOU/uL    Nucl RBC % 0.0 0.0 - 0.2 /100 WBC    Nucl RBC # K/uL 0.0 0.0 - 0.0 THOU/uL    IMM Granulocytes # 0.1 0.0 - 0.1 THOU/uL    IMM Granulocytes 1.1 %   Comprehensive Metabolic Panel, PL    Collection Time: 05/15/16  9:53 AM   Result Value Ref Range    Potassium,Plasma 5.2 (H) 3.4 - 4.7 mmol/L    Sodium,Plasma 141 133 - 145 mmol/L    Anion Gap,PL 10 7 - 16    UN,Plasma 19 6 - 20 mg/dL    Creatinine 0.92 0.51 - 0.95 mg/dL    GFR,Caucasian 70 *    GFR,Black 81 *    Glucose,Plasma 114 (H) 60 - 99 mg/dL    Calcium, PL 10.1 8.6 - 10.2 mg/dL    Chloride,Plasma 102 96 - 108 mmol/L    CO2,Plasma 29 (H) 20 - 28 mmol/L    Alk Phos, PL 110 (H) 35 - 105 U/L  AST, PL 28 0 - 35 U/L    ALT, PL 50 (H) 0 - 35 U/L    Albumin, PL 4.3 3.5 - 5.2 g/dL    Bilirubin Total, PL 0.5 0.0 - 1.2 mg/dL    Total Protein, PL 7.4 6.3 - 7.7 g/dL   LD, PL    Collection Time: 05/15/16  9:53 AM   Result Value Ref Range    LD, PL 206 118 - 225 U/L   Magnesium, Plasma    Collection Time: 05/15/16  9:53 AM   Result Value Ref Range    Magnesium, PL 1.8 1.3 - 2.1 mEq/L   Neutrophil #-Instrument    Collection Time: 05/15/16  9:53 AM   Result Value Ref Range    Neutrophil #-Instrument 4.2 THOU/uL       Wt Readings from Last 3 Encounters:   05/15/16 112.9 kg (248 lb 12.6 oz)   04/30/16 114.4 kg (252 lb 3.3 oz)   04/16/16 115 kg (253 lb 8.5  oz)     Physical Exam   ONCBCN VITALS 05/15/2016   Height    Height Method    Weight 112.85 kg   Weight Method    BSA (m2)    Pain Score (Outpatient) 0   Temp 36.1 C   Temp Source Temporal   Pulse 62   Resp 18   SpO2 98   BP 117/61   BP Location Left arm      KPS  90%  Pain 0/10   No acute distress, non-toxic appearing, cooperative with exam   Appropriate affect and dress with normal speech and motor activity  scalp atraumatic, hair grown back    PERRLA- EOMI. Conjunctiva & corneas clear, no eye drainage. Sclera anicteric OU  Lips, oral mucosa and tongue mildly dry, without lesions; no postpharyngeal exudate    Neck is supple, no rigidity   Lymph nodes: No cervical, submandibular, supraclavicular, infraclavicular, axillary, palpated  Normal S1 & S2, regular, no murmur, rub, or gallop  Respirations unlabored, lung sounds clear to A&P bilateral auscultation  Bowel sounds are normoactive in four quadrants; abdomen is soft, non-tender, and without masses or organomegaly  Radial pulses +2 and symmetric, CMS is intact  Trace non pitting bilat lower extremity edema, limited mobility at times with shoulder hip pain   Neuro AOx3, speech/language WNL, moving all extremities.  Non-focal exam  Skin warm dry intact no lesions, No rash mild hyperpigmentation fading    Assessment   Pleasant  56 yo female with MPN post allogeneic MUD (F) PBSCT, here for follow up. No clinical signs of infection or GVHD. On IST taper, Tacro dose 1 mg am and 0,5 mg pm decreased to 0.5 BID this visit . LFTs (enzymes) better decrease actigall to daily, and budesonide to daily as no GI symptoms.      Plan  MPN post HSCT  Regimen: Fludarabine 40 mg/m2/d and Busulfan 130 mg/m2/d x 4 days   Transplant type: Allogeneic MUD PBSC 10/10 56 yr old female ABO : A+/O\A+. CMV titer:N/N  Transplant day 0:  05/17/15  Transplant day:  + 364  Day 30 marrow 6/6 The hypercellularity and architectural pattern is somewhat unusual, but no definitive evidence of malignancy is  seen. Chimerism results indicate only low level (2%) recipient DNA.   Day 100 marrow 09/15/15 chimerism still 2%, no MDS  chimerism 11/21 trace recipient  Check in January when pt returns from trip  1 yr marrow today and tolerated well  Hematology:   Blood Counts stable   ANC WNL  PLT WNL  No signs of bleeding.  No transfusion  mediport out - 11/2   Ferritin check next visit 5/31    GvHD:   Donor MUD (female)  Completed all doses of MTX  Budesonide tapered to daily on 05/15/16   Tacrolimus 1 mg am 0.5 mg pm taper to 0.5 mg BID 05/15/16  Cont q 4 week taper as able   No clinical signs of GVHD   LFTs better today -decrease actigal to daily  GVHD Grade 0 - see chart below     ID:  Afebrile   No clinical signs of active infection  Immunocompromised  CMV PCR not required both negative  Cont ppx acyclovir    Cont ppx diflucan  PCP ppx dapsone     Re-immunizations at 1 yr post BMT as able to give - once on minimal Tacro can start    Cardiovascular/pulmonary:   BP WNL  HR stable   Cont  metoprolol for now - H/O afib  Cardiology follow up as needed   No leg edema     Doing well with nicotine cravings- off nicotine patches for some time  Has not smoked in just over 1 year, smoking cessation clinic as needed   Last  PFTs March 2018- no sig change from baseline- isolated reduction in diffusing capacity- c/w pulm vasc disease   Consider ECHO - will discuss next visit    Renal/FEN:   Creat improved with Tacrolimus taper now WNL  taper PO magneium -1 tab daily cont to decrease with Tacro taper, level good  Hyperkalemia better with taper   Stop florinef in near future   No dysuria or hematuria  Other lytes stable    Gastrointestinal:   Denies nausea better has PRN medications if needed, not using    No diarrhea.   Cont protonix   Eating well and drinking well   Weight up  LFTs mild transamitis - better -likely medication vs other etiology  Consider liver US if worsen     Endocrine:   Stable.   Eventually bone health meds and Dexa  scan at 1 yr - will discuss next visit  Checking TFTs next appt as well    Neurologic:   Nonfocal; no deficits.   No H/A or visual changes.   No neuro changes on Tacro   Hip & shoulder pain with degenerative changes ibuprofen helpful followed by PCP     Musculoskeletal:   Remains activitive   Walking more and doing more    Derm  Skin intact  No rashes    Aware to limit sun exposure and use sun screen      Psychosocial:   Family supportive.   Continue our support.       BMT attending Lytle Butte   BMT coordinator Meyler   PCP Cahnhidalgo  Adjust plan of care as needed.     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    Organ scoring of cGVHD    Performance status  KPS 90%  _0   0=Asymptomatic, fully active, KPS 100%  _1   1=Symtomatic, fully ambulatory, restricted only in physically strenuous activity, KPS 80-90%  _2   2=Symptomatic, ambulatory,  capable of self-care, >50% if waking hours out of bed, KPS 60-70%  _0   3=Symptomatic, limited self care, >50% of waking hours in bed, KPS <60%    Skin  _1   Maculopapular rash  <[id number]>_1<\/[id number]>_4  Lichen planus-like features  _3  Papulosquamous lesions or icthyosis  _4  Hyperpigmentation  _5  Hypopigmentation  _6  Keratosis pilaris  _7  Erythema  _8  Erythroderma  _9  Poikiloderma  _10  Sclerotic features  _11  Pruritis  _12  Hair involvement  _13  Nail involvement    BSA involved 0%    _14  0=no symptoms  _15  1=<18% BSA, NO sclerotic features  _16  2=19-50% BSA OR superficial sclerotic features but NOT hidebound  _17  3=>50% BSA OR deep sclerosis/hidebound OR impaired mobility, ulceration, or severe pruritis    Mouth  _18  0=No symptoms  _19  1=Mild symptoms with disease signs but NOT limiting oral intake significantly  _20  2=Moderate symptoms with disease WITH partial limitation of oral intake  _21  3=Severe symptoms with disease signs on  examination WITH major limitation of oral intake    Eyes    _22  0=No symptoms  _23  1=Mild dry eye symptoms not affecting ADL, requiring eyedrops <3x per day, OR asymptomatic signs of keratoconjunctivitis sicca  _24  2=Moderate dry eye symptoms partially affecting ADL, requiring eyedrops >3x per day or punctal plugs, WITHOUT vision impairment  _25  3=Severe dry eye symptoms significantly affecting ADL (special eyewear to relieve pain), OR unable to work because of ocular symptoms, OR loss of vision caused by keratoconjunctivitis sicca    GI tract  Today's weight in kilograms 112; Weight 3 months ago in kilograms114    _26  0=No symptoms  _27  1=Symptoms such as dysphagia, anorexia, nausea, vomiting, abdominal pain, or diarrhea WITHOUT significant weight loss  _28  2=Symptoms associated with mild to moderate weight loss (5-15%)  _29  3=Symptoms associated with significant weight loss >15%, requires nutritional supplement for most calorie needs OR espohageal dilation    Liver  _30  0=Normal LFT  _31  1=Elevated TB, AP, AST, or ALT less than 2x ULN (TB<2.80m/dl;AP<210U/L, AST &/or ALT<70U/L)  _32  2=TB>344mdL or TB, enzymes 2-5x ULN (TB 2.5-27m23ml; AP 211-525 U/L, AST &/or ALT 71-175U/L)  _33  3=Bilirubin or enzymes >5x ULN (TB>27mg11m AP>525U/L, AST &/or ALT>175U/L)    Lungs  _34  0=No symptoms AND FEV1 >80%  _35  1=Mild symptoms (shortness of breath after climbing one flight of steps) AND FEV1 60-79%  _36  2=Moderate symptoms (shortness of breath after walking on flat ground) AND FEV1 40-59%  _37  3=Severe symptoms (shortness of breath at rest, requiring oxygen) AND FEV1<39%    Joints and fascia  _38  0=No symptoms  _39  1=Mild tightness of arms or legs, normal or mild decreased ROM and NOT affecting ADL  _40  2=Tightness of arms or legs OR joint contractures, erythema thought due to fasciitis, moderate decrease ROM AND moderate limitation of ADL  _41  3=Contractures WITH significant decrease ROM AND significant limitation of ADL (unable to tie shoes,  button shirts, dress self etc.)    Genital tract  _42  0=No symptoms or female  _43  1=Symptomatic with mild signs on exam AND no effect on coitus and minimal discomfort with gynecologic exam  _44  2=Symptomatic with moderate signs on exam AND no effect on coitus and mild dyspareunia or discomfort with gynecologic exam  _45  3=Symptomatic with advanced signs (stricture, labial agglutination or severe ulceration) AND severe pain with coitus or inability to insert vaginal speculum    Global scoring of cGVHD  In the absence of histologic or clinical signs or symptoms of chronic GVHD, the  persistence, recurrence, or new onset of characteristic skin, GI tract, or liver abnormalities should be classified as acute GVHD regardless of the time after transplantation.    Note that the global scoring system can be applied only AFTER the diagnosis of chronic GVHD is confirmed by either (1) the presence of a diagnostic feature, or, if a diagnostic feature is not present, (2) at least 1 distinctive manifestation of chronic GVHD with the diagnosis supported by histologic, radiologic, or laboratory evidence of GVHD from any site.     _0  No cGVHD     _1  Mild chronic GVHD involves  only 1 or 2 organs or sites (except the lung: see below), with no clinically significant functional impairment (maximum of score 1 in all affected organs or sites)     _2  Moderate chronic GVHD involves  (1) at least 1 organ or site with clinically significant but no major disability (maximum score of 2 in any affected organ or site) or (2) 3 or more organs or sites with no clinically significant functional impairment (maximum score of 1 in all affected organs or sites). A lung score of 1 will also be considered moderate chronic GVHD.      _3  Severe chronic GVHD indicates  major disability caused by chronic GVHD (score of 3 in any organ or site). A lung score of 2 or greater will also be considered severe chronic GVHD.      Other indicators, clinical manifestations,  or complications IF PRESENT AND PRESUMED RELATED to cGVHD     (0=none, 1=mild, 2= moderate, 3=severe)     Indicator, manifestation, complication  Score  Comments   Esophageal stricture or web   _4  0 _5  1 _6  2 _7  3   _8  None evident  _9  Not evaluated/evaluable at this assessment   Pericardial effusion   _10  0 _11  1 _12  2 _13  3    _14  None evident  _15  Not evaluated/evaluable at this assessment   Pleural effusion   _16  0 _17  1 _18  2 _19  3    _20  None evident  _21  Not evaluated/evaluable at this assessment   Ascites (serositis)   _22  0 _23  1 _24  2 _25  3    _26  None evident  _27  Not evaluated/evaluable at this assessment   Nephrotic syndrome  _28  0 _29  1 _30  2 _31  3     _32  None evident  _33  Not evaluated/evaluable at this assessment   Peripheral neuropathy   _34  0 _35  1 _36  2 _37  3    _38  None evident  _39  Not evaluated/evaluable at this assessment   Myasthenia Gravis   _40  0 _41  1 _42  2 _43  3    _44  None evident  _45  Not evaluated/evaluable at this assessment   Cardiomyopathy   _46  0 _47  1 _48  2 _49  3    _50  None evident  _51  Not evaluated/evaluable at this assessment   Polymyositis   _52  0 _53  1 _54  2 _55  3    _56  None evident  _57  Not evaluated/evaluable at this assessment   Cardiac conduction defects   _58  0 _59  1 _60  2 _61  3    _62  None evident  _63  Not evaluated/evaluable at this assessment   Coronary artery invovement   _64  0 _65  1 _66  2 _67  3    _68  None evident  _69  Not evaluated/evaluable at this assessment   Progressive onset  _70  Yes _71  No  _72  progressive _73  overlap _74  classic    Eosinophilis >500/microL  _75   Yes _0  No  _1  presumed R/T cGVHD _2  possibly R/T cGVHD   _3  presumed NOT R/T cGVHD     Platelets <100,000/microL  _4  Yes _5  No  _6  presumed R/T cGVHD _7  possibly R/T cGVHD   _8  presumed NOT R/T cGVHD     Other (specify)  _9  Yes _10  No  _11  presumed R/T cGVHD _12  possibly R/T cGVHD   _13  presumed NOT R/T cGVHD

## 2016-05-15 NOTE — Procedures (Signed)
Bone Marrow Aspirate and Biopsy Procedure Note    Carly Higgins is a 56 y.o. female who comes to the [facility] today for a scheduled bone marrow aspirate and biopsy procedure to evaluate MPN and graft status after undergoing an Allogeneic MUD HSCT     Both informed and written consent was obtained after discussing potential risks of the procedure including but not limited to bleeding, infection and pain. All questions were answered satisfactorily and patient verbalizes a good understanding of bone marrow aspirate and biopsy procedure and is willing to have procedure done here today.   Per policy a time out was taken to verify the patient's identity with the spelling of Carly Higgins last name and date of birth.     What Procedure Was Performed: bone marrow aspirate and biopsy.   Correct Procedure: Yes   Correct Patient: (use 2 Identifiers) Yes   Correct Site: Yes right posterior crest   Site marked: Yes   Correct Side: Yes   Correct Patient Position: Yes prone   Consent Verified: Yes   Appropriate Hand Hygiene Used: Yes   Appropriate barrier: fenestrated drape in place entire time  List of Participants Involved in Time-Out process on 05/15/2016   10:10am:  Irene Pap FNP, Angelia Mould  RN and Mortimer Fries [facility] tech)     Carly Higgins was placed in the prone position and the right posterior iliac crest was prepped with Betadine and sterilely draped. Approximately 15cc 1% lidocaine was used to locally anesthetize the skin and soft tissues directly to the periosteum.    An 8 gauge T-lok bone marrow biopsy needle was introduced into the iliac marrow space and 1 -2 cc of aspirate was obtained and then hand to hematopathology technologist who determined that the aspirate had spicule's present, slides were made. An additional  9 ml of particulate aspirate was obtained after one pass/attempt with out difficulty and sent for flow cytometry, cytogenetics, and molecular studies/chimerism, FISH can be added if needed. The  bone marrow needle was advanced and a 1 cm core bone marrow biopsy was obtained for surgical pathology, with the first pass     The needle was removed and direct pressure was applied to the site for greater than 5 minutes. The area was then cleaned and a sterile dry pressure dressing was placed over the site with folded 2x2's and elastoplasty tape. Pt had minimal bleeding and discomfort. No other immediate complications.   The patient was advised to leave the dressing on until the following morning and was instructed to refer to the provided, written post-procedure information and/or to call Dr Lytle Butte or myself with any increased redness, swelling, drainage including bleeding, persistent or worsening pain at the biopsy site, or fever.     The procedure was tolerated well and there were no complications.   Procedure started at approx   10:10am completed by    10:35 am.   Pt stayed approx 20-30 min after procedure to make sure no problems and for PE to evaluate for GVHD - see separate note.   Left with family- mother.     Discharge instructions reviewed per [facility] 329, copy provided to pt with contact information and post bone marrow aspirate and biopsy procedure care  Pt  verbalize understanding    Pt has follow-up scheduled with [name] to discuss these results once they become available - or sooner by phone if needed    Nurse Practitioner: Irene Pap

## 2016-05-18 LAB — CHIMERISM: Percent Recipient: 0

## 2016-05-18 LAB — CHIM REVIEW

## 2016-05-30 ENCOUNTER — Telehealth: Payer: Self-pay | Admitting: Hematology

## 2016-05-30 NOTE — Telephone Encounter (Signed)
Spoke with Carly Higgins-   Explained taht we did get her message and the new rx will be sent in tomorrow AM  We will touch base when this has been completed.     Carly Higgins is asking if she could take 1mg  once per day  Instead of 0.5mg  twice per day.  I'm concerned that this would not allow for a consistent blood level and would therefore not be recommended.   Will confirm this with the team- and respond when we talk tomorrow.    Pt states understanding of plan.    [facility]- for  Help with the rx.

## 2016-05-31 ENCOUNTER — Other Ambulatory Visit: Payer: Self-pay | Admitting: Oncology

## 2016-05-31 DIAGNOSIS — Z9481 Bone marrow transplant status: Secondary | ICD-10-CM

## 2016-05-31 MED ORDER — TACROLIMUS 0.5 MG PO CAPS *WRAPPED*
0.5000 mg | ORAL_CAPSULE | Freq: Two times a day (BID) | ORAL | 3 refills | Status: DC
Start: 2016-05-31 — End: 2016-09-25

## 2016-05-31 NOTE — Telephone Encounter (Signed)
Carly Higgins is notified that Tacro- 0.5 is called in.     Confirmed that [name] is clear on the importance of taking 0.5mg  twice daily  Pt states understanding of plan.

## 2016-06-06 ENCOUNTER — Ambulatory Visit: Payer: Self-pay

## 2016-06-07 ENCOUNTER — Other Ambulatory Visit
Admission: RE | Admit: 2016-06-07 | Discharge: 2016-06-07 | Disposition: A | Discharge status: Home / Self Care | Payer: No Typology Code available for payment source | Source: Ambulatory Visit | Attending: Oncology | Admitting: Oncology

## 2016-06-07 ENCOUNTER — Ambulatory Visit: Payer: No Typology Code available for payment source | Admitting: Oncology

## 2016-06-07 VITALS — BP 128/69 | HR 68 | Temp 96.3°F | Resp 18 | Wt 254.3 lb

## 2016-06-07 DIAGNOSIS — Z9481 Bone marrow transplant status: Secondary | ICD-10-CM

## 2016-06-07 DIAGNOSIS — M25552 Pain in left hip: Secondary | ICD-10-CM

## 2016-06-07 DIAGNOSIS — D849 Immunodeficiency, unspecified: Secondary | ICD-10-CM

## 2016-06-07 DIAGNOSIS — M25551 Pain in right hip: Secondary | ICD-10-CM

## 2016-06-07 DIAGNOSIS — D471 Chronic myeloproliferative disease: Secondary | ICD-10-CM

## 2016-06-07 LAB — COMPREHENSIVE METABOLIC PANEL, PL
ALT, PL: 60 U/L — ABNORMAL HIGH (ref 0–35)
AST, PL: 43 U/L — ABNORMAL HIGH (ref 0–35)
Albumin, PL: 4.2 g/dL (ref 3.5–5.2)
Alk Phos, PL: 103 U/L (ref 35–105)
Anion Gap,PL: 15 (ref 7–16)
Bilirubin Total, PL: 0.4 mg/dL (ref 0.0–1.2)
CO2,Plasma: 24 mmol/L (ref 20–28)
Calcium, PL: 9.6 mg/dL (ref 8.6–10.2)
Chloride,Plasma: 102 mmol/L (ref 96–108)
Creatinine: 0.84 mg/dL (ref 0.51–0.95)
GFR,Black: 90 *
GFR,Caucasian: 78 *
Glucose,Plasma: 126 mg/dL — ABNORMAL HIGH (ref 60–99)
Potassium,Plasma: 4.2 mmol/L (ref 3.4–4.7)
Sodium,Plasma: 141 mmol/L (ref 133–145)
Total Protein, PL: 7 g/dL (ref 6.3–7.7)
UN,Plasma: 24 mg/dL — ABNORMAL HIGH (ref 6–20)

## 2016-06-07 LAB — CHROMOSOME ANALYSIS
Cells Analyzed: 20
Cells Counted: 20
Karyo Made: 2

## 2016-06-07 LAB — CBC AND DIFFERENTIAL
Baso # K/uL: 0 10*3/uL (ref 0.0–0.1)
Basophil %: 0.6 %
Eos # K/uL: 0.2 10*3/uL (ref 0.0–0.4)
Eosinophil %: 3.5 %
Hematocrit: 37 % (ref 34–45)
Hemoglobin: 11.8 g/dL (ref 11.2–15.7)
IMM Granulocytes #: 0.1 10*3/uL (ref 0.0–0.1)
IMM Granulocytes: 1 %
Lymph # K/uL: 1.2 10*3/uL (ref 1.2–3.7)
Lymphocyte %: 18.7 %
MCH: 32 pg/cell (ref 26–32)
MCHC: 32 g/dL (ref 32–36)
MCV: 98 fL — ABNORMAL HIGH (ref 79–95)
Mono # K/uL: 0.6 10*3/uL (ref 0.2–0.9)
Monocyte %: 9.4 %
Neut # K/uL: 4.2 10*3/uL (ref 1.6–6.1)
Nucl RBC # K/uL: 0 10*3/uL (ref 0.0–0.0)
Nucl RBC %: 0 /100 WBC (ref 0.0–0.2)
Platelets: 211 10*3/uL (ref 160–370)
RBC: 3.8 MIL/uL — ABNORMAL LOW (ref 3.9–5.2)
RDW: 14.6 % — ABNORMAL HIGH (ref 11.7–14.4)
Seg Neut %: 66.8 %
WBC: 6.3 10*3/uL (ref 4.0–10.0)

## 2016-06-07 LAB — NEUTROPHIL #-INSTRUMENT: Neutrophil #-Instrument: 4.2 10*3/uL

## 2016-06-07 LAB — MAGNESIUM, PLASMA: Magnesium, PL: 1.6 mEq/L (ref 1.3–2.1)

## 2016-06-07 LAB — FERRITIN: Ferritin: 1196 ng/mL — ABNORMAL HIGH (ref 10–120)

## 2016-06-07 LAB — TSH: TSH: 5.26 u[IU]/mL — ABNORMAL HIGH (ref 0.27–4.20)

## 2016-06-07 LAB — CHROMOSOMES REVIEW

## 2016-06-07 LAB — T4, FREE: Free T4: 1 ng/dL (ref 0.9–1.7)

## 2016-06-07 LAB — LD, PL: LD, PL: 210 U/L (ref 118–225)

## 2016-06-11 ENCOUNTER — Encounter: Payer: Self-pay | Admitting: Oncology

## 2016-06-11 LAB — SURGICAL PATHOLOGY

## 2016-06-11 NOTE — Progress Notes (Signed)
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, immunocompromised, at risk for GVHD    PMH, Problem List  Reviewed and updated as needed    Interval History/Review of Systems  Lenix reports her bilateral hip pain is worse  Difficulty with mobility  Do not see imaging of hips prior - pt know sh e had back imaging a long time ago  Will discuss with Dr Lytle Butte likely will need MRI   Reviewed recent bm bx results - no MDS, chimerism 0, happy  No fevers chills   No N/V or diarrhea no abd pain   No cough or SOB  No rash  No mouth sores or difficulty swalling    Other pertinent findings checked/documented below     A comprehensive review of 13 systems was performed; pertinent positives checked below:  []  fevers  []  fast pulse []  decreased appetite  []  weakness   []  chills []  chest pain []  altered taste  []  poor mobility    []  dry eyes []  nausea   []  insufficient oral intake  []  impaired ADLs   []  visual changes  []  emesis  [x]  skin changes dry []  insomnia    []  headache  []  abdominal cramping  []  rash  []  leg edema better   []  sores in mouth []  abdominal discomfort []  urinary frequency []     []  sore tongue  []  diarrhea, watery []  dysuria and hematuria  []  anxiety    []  cough   []  loose stool, non-formed []  bleeding  []  poor coping    []  sputum production  []  constipation   [x]  pain joints hip- worse   []  neuropathy   []  shortness of breath/DOE   []   weight loss  [x]  fatigue at times  []  none of the above      Patient Active Problem List   Diagnosis Code    MPN (myeloproliferative neoplasm) D47.1    Myeloproliferative disease D47.1    post allogenic MUD (F)  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 Z94.81    Immunocompromised D84.9    Hyperkalemia E87.5    Bilateral hip pain. M25.551, M25.552    A-fib I48.91        Medications  Reviewed medications with patient today & electronically reconciled  Current Outpatient Prescriptions   Medication Sig Note    tacrolimus (PROGRAF) 0.5 MG capsule Take 1 capsule (0.5 mg total) by mouth 2 times daily 06/07/2016: Since May 8 0.5/0.5    escitalopram (LEXAPRO) 20 MG tablet Take 20 mg by mouth daily 05/15/2016: Received from: External Pharmacy Received Sig: TAKE 1 TABLETS ORALLY EVERY DAY    Budesonide (ENTOCORT EC) 3 MG 24 hr capsule Take 1 capsule (3 mg total) by mouth 2 times daily (Patient taking differently: Take 3 mg by mouth every morning   Tapered to daily on 5/8)     acyclovir (ZOVIRAX) 400 MG tablet Take 1 tablet (400 mg total) by mouth 2 times daily     fludrocortisone (FLORINEF) 0.1 mg tablet TAKE 1 TABLET (0.1 MG TOTAL) BY MOUTH DAILY     metoprolol (LOPRESSOR) 50 MG tablet Take 1 tablet (50 mg total) by mouth 2 times daily     ursodiol (ACTIGALL) 300  MG capsule Take 1 capsule (300 mg total) by mouth 2 times daily (Patient taking differently: Take 300 mg by mouth every morning   Tapered to daily on 5/8)     dapsone 100 MG tablet Take 1 tablet (100 mg total) by mouth daily     pantoprazole (PROTONIX) 40 MG EC tablet Take 1 tablet (40 mg total) by mouth 2 times daily   Swallow whole. Do not crush, break, or chew.     fluconazole (DIFLUCAN) 200 MG tablet Take 1 tablet (200 mg total) by mouth nightly     aspirin 81 MG EC tablet Take 81 mg by mouth daily     biotin 5 MG tablet Take 5 mg by mouth daily     MAGNESIUM PO Take 1 tablet by mouth daily        ondansetron (ZOFRAN) 4 MG tablet Take 1 tablet (4 mg total) by mouth 3 times daily as needed (nausea)     ibuprofen (ADVIL,MOTRIN) 200 MG tablet Take 600 mg by mouth 3 times daily as needed for Pain        prochlorperazine (COMPAZINE) 10 MG tablet Take 1 tablet (10 mg total) by mouth 4 times daily as needed for Nausea      No current facility-administered medications for this visit.          Recent labs -  reviewed    Results for CAMBRY, SPAMPINATO (MRN [id number]) as of 06/11/2016 00:31   Ref. Range 06/07/2016 10:58   Sodium,Plasma Latest Ref Range: 133 - 145 mmol/L 141   Potassium,Plasma Latest Ref Range: 3.4 - 4.7 mmol/L 4.2   Chloride,Plasma Latest Ref Range: 96 - 108 mmol/L 102   CO2,Plasma Latest Ref Range: 20 - 28 mmol/L 24   Anion Gap,PL Latest Ref Range: 7 - 16  15   UN,Plasma Latest Ref Range: 6 - 20 mg/dL 24 (H)   Creatinine Latest Ref Range: 0.51 - 0.95 mg/dL 0.84   GFR,Black Latest Units: * 90   GFR,Caucasian Latest Units: * 78   Glucose,Plasma Latest Ref Range: 60 - 99 mg/dL 126 (H)   Calcium, PL Latest Ref Range: 8.6 - 10.2 mg/dL 9.6   Magnesium, PL Latest Ref Range: 1.3 - 2.1 mEq/L 1.6   Total Protein, PL Latest Ref Range: 6.3 - 7.7 g/dL 7.0   Albumin, PL Latest Ref Range: 3.5 - 5.2 g/dL 4.2   LD, PL Latest Ref Range: 118 - 225 U/L 210   ALT, PL Latest Ref Range: 0 - 35 U/L 60 (H)   AST, PL Latest Ref Range: 0 - 35 U/L 43 (H)   Alk Phos, PL Latest Ref Range: 35 - 105 U/L 103   Bilirubin Total, PL Latest Ref Range: 0.0 - 1.2 mg/dL 0.4   Ferritin Latest Ref Range: 10 - 120 ng/mL 1196 (H)   TSH Latest Ref Range: 0.27 - 4.20 uIU/mL 5.26 (H)   Free T4 Latest Ref Range: 0.9 - 1.7 ng/dL 1.0   WBC Latest Ref Range: 4.0 - 10.0 THOU/uL 6.3   RBC Latest Ref Range: 3.9 - 5.2 MIL/uL 3.8 (L)   Hemoglobin Latest Ref Range: 11.2 - 15.7 g/dL 11.8   Hematocrit Latest Ref Range: 34 - 45 % 37   MCV Latest Ref Range: 79 - 95 fL 98 (H)   MCH Latest Ref Range: 26 - 32 pg/cell 32   MCHC Latest Ref Range: 32 - 36 g/dL 32   RDW Latest Ref Range: 11.7 - 14.4 %  14.6 (H)   Platelets Latest Ref Range: 160 - 370 THOU/uL 211   Neut # K/uL Latest Ref Range: 1.6 - 6.1 THOU/uL 4.2   Neutrophil #-Instrument Latest Units: THOU/uL 4.2   Lymph # K/uL Latest Ref Range: 1.2 - 3.7 THOU/uL 1.2   Mono # K/uL Latest Ref Range: 0.2 - 0.9 THOU/uL 0.6   Eos # K/uL Latest Ref Range: 0.0 - 0.4 THOU/uL 0.2   Baso # K/uL Latest Ref Range: 0.0 - 0.1 THOU/uL 0.0    IMM Granulocytes # Latest Ref Range: 0.0 - 0.1 THOU/uL 0.1   Nucl RBC # K/uL Latest Ref Range: 0.0 - 0.0 THOU/uL 0.0   Seg Neut % Latest Units: % 66.8   Lymphocyte % Latest Units: % 18.7   Monocyte % Latest Units: % 9.4   Eosinophil % Latest Units: % 3.5   Basophil % Latest Units: % 0.6   IMM Granulocytes Latest Units: % 1.0   Nucl RBC % Latest Ref Range: 0.0 - 0.2 /100 WBC 0.0       Wt Readings from Last 3 Encounters:   06/07/16 115.4 kg (254 lb 4.8 oz)   05/15/16 112.9 kg (248 lb 12.6 oz)   04/30/16 114.4 kg (252 lb 3.3 oz)     Physical Exam   BP 128/69 (BP Location: Right arm, Patient Position: Sitting, Cuff Size: large adult)   Pulse 68   Temp 35.7 C (96.3 F) (Temporal)    Resp 18   Wt 115.4 kg (254 lb 4.8 oz)   SpO2 98%   BMI 41.07 kg/m2  KPS  80-90%  Pain 7/10 hip pain   No acute distress, non-toxic appearing, cooperative with exam   Appropriate affect and dress with normal speech and motor activity  scalp atraumatic, hair grown back    PERRLA- EOMI. Conjunctiva & corneas clear, no eye drainage. Sclera anicteric OU  Lips, oral mucosa and tongue mildly dry, without lesions; no postpharyngeal exudate    Neck is supple, no rigidity   Lymph nodes: No cervical, submandibular, supraclavicular, infraclavicular, axillary, palpated  Normal S1 & S2, regular, no murmur, rub, or gallop  Respirations unlabored, lung sounds clear to A&P bilateral auscultation  Bowel sounds are normoactive in four quadrants; abdomen is soft, non-tender, and without masses or organomegaly  Radial pulses +2 and symmetric, CMS is intact  Trace non pitting bilat lower extremity edema, limited mobility at times with shoulder hip pain   Neuro AOx3, speech/language WNL, moving all extremities.  Non-focal exam  Skin warm dry intact no lesions, No rash mild hyperpigmentation fading    Assessment   Pleasant  56 yo female with MPN post allogeneic MUD (F) PBSCT, here for follow up. No clinical signs of infection or GVHD. On IST taper, no dose  reduction today,  LFTs (enzymes) stable. Blood counts WNL. Worsening hip pain, unclear etiology will order  plain films to start and if necessary additional imaging with MRI    Plan  MPN post HSCT  Regimen: Fludarabine 40 mg/m2/d and Busulfan 130 mg/m2/d x 4 days   Transplant type: Allogeneic MUD PBSC 10/10 56 yr old female ABO : A+/O\A+. CMV titer:N/N  Transplant day 0:  05/17/15  Transplant day:  12 months   Day 30 marrow 6/6 The hypercellularity and architectural pattern is somewhat unusual, but no definitive evidence of malignancy is seen. Chimerism results indicate only low level (2%) recipient DNA.   Day 100 marrow 09/15/15 chimerism still 2%, no MDS  chimerism 11/21  trace recipient  Check in January when pt returns from trip  1 yr marrow 05/15/16 no MDS,   Chimerism 0% recipient. Normal Karyotype     Hematology:   Blood Counts stable   ANC WNL  PLT WNL  No signs of bleeding.  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)  Completed all doses of MTX  Budesonide tapered to daily on 05/15/16   Tacrolimus  0.5 mg BID 05/15/16, no change today   Cont q 4 week taper as able - consider late June   No clinical signs of GVHD   LFTs stable mild elevation -actigal to daily  Can stop actigal next visit   No GVHD grade 0    ID:  Afebrile   No clinical signs of active infection  Immunocompromised  CMV PCR not required both negative  Cont ppx acyclovir    Cont ppx diflucan- may be able to stop soon   PCP ppx dapsone   - may be able to stop soon  Re-immunizations at 1 yr post BMT as able to give - once on minimal Tacro can start- consider next  Month     Cardiovascular/pulmonary:   BP WNL  HR stable   Cont  metoprolol for now - H/O afib  Cardiology follow up as needed   No leg edema     Doing well with nicotine cravings- off nicotine patches for some time  Has not smoked in over 1 year, smoking cessation clinic as needed   Last  PFTs March 2018- no sig change from baseline- isolated  reduction in diffusing capacity- c/w pulm vasc disease   Consider ECHO - will discuss next visit    Renal/FEN:   Creat improved with Tacrolimus taper now WNL  Stop PO magneium   Off  florinef    No dysuria or hematuria  Other lytes stable    Gastrointestinal:   Denies nausea  has PRN medications if needed, not using    No diarrhea.   Cont protonix   Eating well and drinking well   Weight up  LFTs mild transamitis - satble -likely medication vs other etiology  Consider liver US if worsen - ferritin elevated possible iron overload     Endocrine:   Stable.   Eventually bone health meds and Dexa scan at 1 yr - will discuss next visit  Checking TFTs today TSH high Free T 4 normal - no medication at tis time recheck in 3-6 months     Neurologic:   Nonfocal; no deficits.   No H/A or visual changes.   No neuro changes on Tacro   Hip pain worse, ibuprofen not helpful   Order plain films bothe sides, if negative or consern check MRI    If pain worsens can [place] can order Oxy IR prn short term    Musculoskeletal:   Activity as tolerated, limited now due to bilat hip pain     Derm  Skin intact  No rashes    Aware to limit sun exposure and use sun screen      Psychosocial:   Family supportive.   Continue our support.       BMT attending Lytle Butte   BMT coordinator Meyler   PCP Cahnhidalgo  Adjust plan of care as needed.     Follow up  BMT clinic 4 weeks with labs - sooner if needed  Will call with  any issues or concerns   Tacro taper every 4 weeks - if no GVHD   Consider DEXA consider ECHO  follo wup with hip films, consider MRI if negative or additional information needed     Irene Pap, NP

## 2016-06-13 ENCOUNTER — Ambulatory Visit: Payer: Self-pay | Admitting: Nurse Practitioner

## 2016-06-25 ENCOUNTER — Other Ambulatory Visit: Payer: Self-pay | Admitting: Oncology

## 2016-06-25 DIAGNOSIS — K219 Gastro-esophageal reflux disease without esophagitis: Secondary | ICD-10-CM

## 2016-06-25 DIAGNOSIS — Z9481 Bone marrow transplant status: Secondary | ICD-10-CM

## 2016-06-26 ENCOUNTER — Other Ambulatory Visit: Payer: Self-pay | Admitting: Hematology

## 2016-06-26 ENCOUNTER — Telehealth: Payer: Self-pay | Admitting: Hematology

## 2016-06-26 DIAGNOSIS — Z9481 Bone marrow transplant status: Secondary | ICD-10-CM

## 2016-06-26 NOTE — Telephone Encounter (Signed)
I advised Carly Higgins that she may have plain films of her hips done either here at the hospital or at the [facility]. The orders are in.  She stated understanding.

## 2016-07-03 ENCOUNTER — Ambulatory Visit
Admission: RE | Admit: 2016-07-03 | Discharge: 2016-07-03 | Disposition: A | Discharge status: Home / Self Care | Payer: No Typology Code available for payment source | Source: Ambulatory Visit

## 2016-07-03 ENCOUNTER — Telehealth: Payer: Self-pay | Admitting: Hematology

## 2016-07-03 ENCOUNTER — Other Ambulatory Visit
Admission: RE | Admit: 2016-07-03 | Discharge: 2016-07-03 | Disposition: A | Discharge status: Home / Self Care | Payer: No Typology Code available for payment source | Source: Ambulatory Visit | Attending: Oncology | Admitting: Oncology

## 2016-07-03 ENCOUNTER — Ambulatory Visit
Admission: RE | Admit: 2016-07-03 | Discharge: 2016-07-03 | Disposition: A | Discharge status: Home / Self Care | Payer: No Typology Code available for payment source | Source: Ambulatory Visit | Attending: Oncology | Admitting: Oncology

## 2016-07-03 ENCOUNTER — Other Ambulatory Visit: Payer: Self-pay | Admitting: Oncology

## 2016-07-03 DIAGNOSIS — Z9481 Bone marrow transplant status: Secondary | ICD-10-CM

## 2016-07-03 DIAGNOSIS — M25551 Pain in right hip: Secondary | ICD-10-CM

## 2016-07-03 DIAGNOSIS — M160 Bilateral primary osteoarthritis of hip: Secondary | ICD-10-CM | POA: Insufficient documentation

## 2016-07-03 DIAGNOSIS — M25552 Pain in left hip: Secondary | ICD-10-CM | POA: Insufficient documentation

## 2016-07-03 DIAGNOSIS — D471 Chronic myeloproliferative disease: Secondary | ICD-10-CM | POA: Insufficient documentation

## 2016-07-03 LAB — COMPREHENSIVE METABOLIC PANEL, PL
ALT, PL: 59 U/L — ABNORMAL HIGH (ref 0–35)
AST, PL: 37 U/L — ABNORMAL HIGH (ref 0–35)
Albumin, PL: 4 g/dL (ref 3.5–5.2)
Alk Phos, PL: 103 U/L (ref 35–105)
Anion Gap,PL: 11 (ref 7–16)
Bilirubin Total, PL: 0.4 mg/dL (ref 0.0–1.2)
CO2,Plasma: 29 mmol/L — ABNORMAL HIGH (ref 20–28)
Calcium, PL: 9.7 mg/dL (ref 8.6–10.2)
Chloride,Plasma: 100 mmol/L (ref 96–108)
Creatinine: 0.87 mg/dL (ref 0.51–0.95)
GFR,Black: 86 *
GFR,Caucasian: 75 *
Glucose,Plasma: 95 mg/dL (ref 60–99)
Potassium,Plasma: 4.8 mmol/L — ABNORMAL HIGH (ref 3.4–4.7)
Sodium,Plasma: 140 mmol/L (ref 133–145)
Total Protein, PL: 6.8 g/dL (ref 6.3–7.7)
UN,Plasma: 21 mg/dL — ABNORMAL HIGH (ref 6–20)

## 2016-07-03 LAB — CBC AND DIFFERENTIAL
Baso # K/uL: 0.1 10*3/uL (ref 0.0–0.1)
Basophil %: 1 %
Eos # K/uL: 0.3 10*3/uL (ref 0.0–0.4)
Eosinophil %: 4.3 %
Hematocrit: 38 % (ref 34–45)
Hemoglobin: 11.8 g/dL (ref 11.2–15.7)
IMM Granulocytes #: 0.1 10*3/uL (ref 0.0–0.1)
IMM Granulocytes: 1.3 %
Lymph # K/uL: 1.2 10*3/uL (ref 1.2–3.7)
Lymphocyte %: 20.7 %
MCH: 31 pg/cell (ref 26–32)
MCHC: 31 g/dL — ABNORMAL LOW (ref 32–36)
MCV: 100 fL — ABNORMAL HIGH (ref 79–95)
Mono # K/uL: 0.6 10*3/uL (ref 0.2–0.9)
Monocyte %: 9.3 %
Neut # K/uL: 3.8 10*3/uL (ref 1.6–6.1)
Nucl RBC # K/uL: 0 10*3/uL (ref 0.0–0.0)
Nucl RBC %: 0 /100 WBC (ref 0.0–0.2)
Platelets: 190 10*3/uL (ref 160–370)
RBC: 3.8 MIL/uL — ABNORMAL LOW (ref 3.9–5.2)
RDW: 13.9 % (ref 11.7–14.4)
Seg Neut %: 63.4 %
WBC: 6 10*3/uL (ref 4.0–10.0)

## 2016-07-03 LAB — LD, PL: LD, PL: 208 U/L (ref 118–225)

## 2016-07-03 LAB — NEUTROPHIL #-INSTRUMENT: Neutrophil #-Instrument: 3.8 10*3/uL

## 2016-07-03 LAB — MAGNESIUM, PLASMA: Magnesium, PL: 1.7 mEq/L (ref 1.3–2.1)

## 2016-07-03 NOTE — Telephone Encounter (Signed)
Orders are in the system. Carly Higgins can have her labs drawn today if it is convenient for her.

## 2016-07-04 NOTE — Progress Notes (Signed)
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, immunocompromised, at risk for GVHD    PMH, Problem List  Reviewed and updated as needed    Interval History/Review of Systems  Shadiyah reports her bilateral hip pain is better today but pain cont to flucuate   Recent plain films with degenerative changes  Difficulty with mobility at times  ibuprofen not always helpful, pain can be unbearable at times   Feeling a little down today, fatigued, may be weather heat   No fevers chills   No N/V or diarrhea no abd pain   No cough or SOB  No rash  No mouth sores or difficulty swalling  Mouth less dry     Other pertinent findings checked/documented below     A comprehensive review of 13 systems was performed; pertinent positives checked below:  _0  fevers  _1  fast pulse _2  decreased appetite  _3  weakness   _4  chills _5  chest pain _6  altered taste  _7  poor mobility    _8  dry eyes _9  nausea   _10  insufficient oral intake  _11  impaired ADLs   _12  visual changes  _13  emesis  _14  skin changes dry _15  insomnia    _16  headache  _17  abdominal cramping  _18  rash  _19  leg edema better   _20  sores in mouth _21  abdominal discomfort _22  urinary frequency _23     _24  sore tongue  _25  diarrhea, watery _26  dysuria and hematuria  _27  anxiety    _28  cough   _29  loose stool, non-formed _30  bleeding  _31  poor coping    _32  sputum production  _33  constipation   _34  pain joints hip   _35  neuropathy   _36  shortness of breath/DOE   _37   weight loss  _38  fatigue   _39  none of the above      Patient Active Problem List   Diagnosis Code    MPN (myeloproliferative neoplasm) D47.1    Myeloproliferative disease D47.1    post allogenic MUD (F)  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 Z94.81    Immunocompromised D84.9    Hyperkalemia E87.5    Bilateral hip pain. M25.551, M25.552    A-fib I48.91        Medications  Reviewed medications with patient today & electronically reconciled  Current Outpatient Prescriptions   Medication Sig Note    fluconazole (DIFLUCAN) 200 MG tablet TAKE 1 TABLET (200 MG TOTAL) BY MOUTH NIGHTLY     pantoprazole (PROTONIX) 40 MG EC tablet TAKE 1 TABLET (40 MG TOTAL) BY MOUTH 2 TIMES DAILY SWALLOW WHOLE. DO NOT CRUSH, BREAK, OR CHEW.     tacrolimus (PROGRAF) 0.5 MG capsule Take 1 capsule (0.5 mg total) by mouth 2 times daily (Patient taking differently: Take 0.5 mg by mouth daily   ) 06/07/2016: Since May 8 0.5/0.5    escitalopram (LEXAPRO) 20 MG tablet Take 20 mg by mouth daily 05/15/2016: Received from: External Pharmacy Received Sig: TAKE 1 TABLETS ORALLY EVERY DAY    acyclovir (ZOVIRAX) 400 MG tablet Take 1 tablet (400 mg total) by mouth 2 times daily     metoprolol (LOPRESSOR) 50 MG tablet Take 1 tablet (50 mg total) by mouth 2 times daily     ursodiol (ACTIGALL) 300 MG capsule Take  1 capsule (300 mg total) by mouth 2 times daily (Patient taking differently: Take 300 mg by mouth every morning   Tapered to daily on 5/8)     aspirin 81 MG EC tablet Take 81 mg by mouth daily     ibuprofen (ADVIL,MOTRIN) 200 MG tablet Take 600 mg by mouth 3 times daily as needed for Pain        biotin 5 MG tablet Take 5 mg by mouth daily     traMADol (ULTRAM) 50 MG tablet Take 1 tablet (50 mg total) by mouth every 6 hours as needed for Pain   Max daily dose: 200 mg     ondansetron (ZOFRAN) 4 MG tablet Take 1 tablet (4 mg total) by mouth 3 times daily as needed (nausea)     prochlorperazine (COMPAZINE) 10 MG tablet Take 1 tablet (10 mg total) by mouth 4 times daily as needed for Nausea      No current facility-administered medications for this visit.          Recent labs - reviewed    Recent Results (from the past 72 hour(s))   CBC and differential    Collection Time: 07/03/16 11:05 AM   Result Value Ref Range    WBC 6.0 4.0 - 10.0 THOU/uL    RBC 3.8 (L) 3.9 - 5.2 MIL/uL    Hemoglobin 11.8  11.2 - 15.7 g/dL    Hematocrit 38 34 - 45 %    MCV 100 (H) 79 - 95 fL    MCH 31 26 - 32 pg/cell    MCHC 31 (L) 32 - 36 g/dL    RDW 13.9 11.7 - 14.4 %    Platelets 190 160 - 370 THOU/uL    Seg Neut % 63.4 %    Lymphocyte % 20.7 %    Monocyte % 9.3 %    Eosinophil % 4.3 %    Basophil % 1.0 %    Neut # K/uL 3.8 1.6 - 6.1 THOU/uL    Lymph # K/uL 1.2 1.2 - 3.7 THOU/uL    Mono # K/uL 0.6 0.2 - 0.9 THOU/uL    Eos # K/uL 0.3 0.0 - 0.4 THOU/uL    Baso # K/uL 0.1 0.0 - 0.1 THOU/uL    Nucl RBC % 0.0 0.0 - 0.2 /100 WBC    Nucl RBC # K/uL 0.0 0.0 - 0.0 THOU/uL    IMM Granulocytes # 0.1 0.0 - 0.1 THOU/uL    IMM Granulocytes 1.3 %   Comprehensive Metabolic Panel, PL    Collection Time: 07/03/16 11:05 AM   Result Value Ref Range    Potassium,Plasma 4.8 (H) 3.4 - 4.7 mmol/L    Sodium,Plasma 140 133 - 145 mmol/L    Anion Gap,PL 11 7 - 16    UN,Plasma 21 (H) 6 - 20 mg/dL    Creatinine 0.87 0.51 - 0.95 mg/dL    GFR,Caucasian 75 *    GFR,Black 86 *    Glucose,Plasma 95 60 - 99 mg/dL    Calcium, PL 9.7 8.6 - 10.2 mg/dL    Chloride,Plasma 100 96 - 108 mmol/L    CO2,Plasma 29 (H) 20 - 28 mmol/L    Alk Phos, PL 103 35 - 105 U/L    AST, PL 37 (H) 0 - 35 U/L    ALT, PL 59 (H) 0 - 35 U/L    Albumin, PL 4.0 3.5 - 5.2 g/dL    Bilirubin Total, PL 0.4 0.0 - 1.2  mg/dL    Total Protein, PL 6.8 6.3 - 7.7 g/dL   LD, PL    Collection Time: 07/03/16 11:05 AM   Result Value Ref Range    LD, PL 208 118 - 225 U/L   Magnesium, Plasma    Collection Time: 07/03/16 11:05 AM   Result Value Ref Range    Magnesium, PL 1.7 1.3 - 2.1 mEq/L   Neutrophil #-Instrument    Collection Time: 07/03/16 11:05 AM   Result Value Ref Range    Neutrophil #-Instrument 3.8 THOU/uL     Radiology impressions (last 3 days):  07/03/16 Bilateral hip films    FINDINGS / IMPRESSION:   No acute fracture or dislocation. No widening of the SI joints or pubic symphysis. No evidence of avascular necrosis.  Mild to moderate degenerative changes of the bilateral femoral acetabular joints with  subchondral sclerosis      Wt Readings from Last 3 Encounters:   06/07/16 115.4 kg (254 lb 4.8 oz)   05/15/16 112.9 kg (248 lb 12.6 oz)   04/30/16 114.4 kg (252 lb 3.3 oz)     Physical Exam   BP 134/61 (BP Location: Left arm, Patient Position: Sitting, Cuff Size: large adult)   Pulse 60   Temp 35.2 C (95.4 F) (Temporal)    Resp 18   Wt 116.8 kg (257 lb 9.7 oz)   SpO2 97%   BMI 41.6 kg/m2  KPS  80-90%  Pain 7/10 hip pain   No acute distress, non-toxic appearing, cooperative with exam   Appropriate affect and dress with normal speech and motor activity  scalp atraumatic, hair grown back    PERRLA- EOMI. Conjunctiva & corneas clear, no eye drainage. Sclera anicteric OU  Lips, oral mucosa and tongue mildly dry, without lesions; no postpharyngeal exudate    Neck is supple, no rigidity   Lymph nodes: No cervical, submandibular, supraclavicular, infraclavicular, axillary, palpated  Normal S1 & S2, regular, no murmur, rub, or gallop  Respirations unlabored, lung sounds clear to A&P bilateral auscultation  Bowel sounds are normoactive in four quadrants; abdomen is soft, non-tender, and without masses or organomegaly  Radial pulses +2 and symmetric, CMS is intact  Trace non pitting bilat lower extremity edema, limited mobility at times with shoulder hip pain   Neuro AOx3, speech/language WNL, moving all extremities.  Non-focal exam  Skin warm dry intact no lesions, No rash mild hyperpigmentation fading    Assessment   Pleasant  56 yo female with MPN post allogeneic MUD (F) PBSCT, here for follow up. No clinical signs of infection or GVHD. On IST taper, dose reduction today to 0.5 mg daily. LFTs (enzymes) stable. Blood counts WNL. Hip films with degenerative changes, pain fluctuates, trial PRN tramadol.     Plan  MPN post HSCT  Regimen: Fludarabine 40 mg/m2/d and Busulfan 130 mg/m2/d x 4 days   Transplant type: Allogeneic MUD PBSC 10/10 56 yr old female ABO : A+/O\A+. CMV titer:N/N  Transplant day 0:  05/17/15  Transplant day:   13 months   Day 30 marrow 6/6 The hypercellularity and architectural pattern is somewhat unusual, but no definitive evidence of malignancy is seen. Chimerism results indicate only low level (2%) recipient DNA.   Day 100 marrow 09/15/15 chimerism still 2%, no MDS  chimerism 11/21 trace recipient  1 yr marrow 05/15/16 no MDS,   Chimerism 0% recipient. Normal Karyotype     Hematology:   Blood Counts stable WNL  ANC WNL  PLT WNL  No signs of bleeding.  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)  Completed all doses of MTX  Budesonide tapered to daily on 05/15/16 - stopped 6/28  Tacrolimus  0.5 mg BID 05/15/16 to 07/05/16,  change today 0.5 mg daily   No clinical signs of GVHD   LFTs stable mild elevation -actigal daily consider stopping in July   No GVHD grade 0    ID:  Afebrile   No clinical signs of active infection  Immunocompromised  CMV PCR not required both negative  Cont ppx acyclovir    Cont ppx diflucan- stop next month if remains on low dose Tac  PCP ppx dapsone   -stop 6/28  Re-immunizations at 1 yr post BMT as able to give - on minimal Tacro can start- consider in July     Cardiovascular/pulmonary:   BP WNL  HR stable   Cont  metoprolol for now - H/O afib  Cardiology follow up as needed   No leg edema     Has not smoked in over 1 year, smoking cessation clinic as needed   Last  PFTs March 2018- no sig change from baseline- isolated reduction in diffusing capacity- c/w pulm vasc disease   ECHO - 11/2015, consider repeating in fall 2018     Renal/FEN:   Creat  WNL  No dysuria or hematuria  Other lytes stable    Gastrointestinal:   Denies nausea      No diarrhea.   Cont protonix   Weight stable  LFTs mild transamitis - -likely medication vs other etiology  actigal likely not beneficial wil stop next visit   Consider liver US if worsen - ferritin elevated possible iron overload     Endocrine:   Stable.   Eventually bone health meds and Dexa scan at 1 yr - will  discuss next visit  Last TFTs May 2018 TSH high Free T 4 normal - no medication at tis time recheck in 3-6 months - after August     Neurologic:   Nonfocal; no deficits.   No H/A or visual changes.   No neuro changes on Tacro   Hip pain flucuates, ibuprofen not helpful   Will trial tramadol   plain films hips negative for AVN, fx   Consider orthopedic consult   encouraged physical therapy     Musculoskeletal:   Activity as tolerated, limited at times due to bilat hip pain   Pin flucuates     Derm  Skin intact  No rashes    Aware to limit sun exposure and use sun screen      Psychosocial:   Family supportive.   Continue our support.       BMT attending Lytle Butte   BMT coordinator Meyler   PCP Cahnhidalgo  Adjust plan of care as needed.     Follow up  BMT clinic 4 weeks with labs - sooner if needed  Will call with any issues or concerns   Consider DEXA scam     Irene Pap, NP

## 2016-07-05 ENCOUNTER — Ambulatory Visit: Payer: No Typology Code available for payment source | Attending: Oncology | Admitting: Oncology

## 2016-07-05 ENCOUNTER — Encounter: Payer: Self-pay | Admitting: Oncology

## 2016-07-05 ENCOUNTER — Other Ambulatory Visit: Payer: Self-pay | Admitting: Internal Medicine

## 2016-07-05 VITALS — BP 134/61 | HR 60 | Temp 95.4°F | Resp 18 | Wt 257.6 lb

## 2016-07-05 DIAGNOSIS — Z9481 Bone marrow transplant status: Secondary | ICD-10-CM | POA: Insufficient documentation

## 2016-07-05 DIAGNOSIS — M25551 Pain in right hip: Secondary | ICD-10-CM

## 2016-07-05 DIAGNOSIS — Z1239 Encounter for other screening for malignant neoplasm of breast: Secondary | ICD-10-CM

## 2016-07-05 DIAGNOSIS — D849 Immunodeficiency, unspecified: Secondary | ICD-10-CM | POA: Insufficient documentation

## 2016-07-05 DIAGNOSIS — M25552 Pain in left hip: Secondary | ICD-10-CM | POA: Insufficient documentation

## 2016-07-05 DIAGNOSIS — D471 Chronic myeloproliferative disease: Secondary | ICD-10-CM | POA: Insufficient documentation

## 2016-07-06 MED ORDER — TRAMADOL HCL 50 MG PO TABS *I*
50.0000 mg | ORAL_TABLET | Freq: Four times a day (QID) | ORAL | 0 refills | Status: DC | PRN
Start: 2016-07-06 — End: 2020-05-19

## 2016-07-24 ENCOUNTER — Other Ambulatory Visit: Payer: Self-pay | Admitting: Hematology

## 2016-07-24 DIAGNOSIS — Z9481 Bone marrow transplant status: Secondary | ICD-10-CM

## 2016-07-27 ENCOUNTER — Ambulatory Visit: Payer: No Typology Code available for payment source

## 2016-07-27 ENCOUNTER — Ambulatory Visit: Payer: No Typology Code available for payment source | Attending: Oncology | Admitting: Oncology

## 2016-07-27 ENCOUNTER — Other Ambulatory Visit
Admission: RE | Admit: 2016-07-27 | Discharge: 2016-07-27 | Disposition: A | Discharge status: Home / Self Care | Payer: No Typology Code available for payment source | Source: Ambulatory Visit | Attending: Oncology | Admitting: Oncology

## 2016-07-27 VITALS — BP 127/58 | HR 64 | Temp 95.4°F | Resp 18 | Wt 257.1 lb

## 2016-07-27 DIAGNOSIS — Z9481 Bone marrow transplant status: Secondary | ICD-10-CM | POA: Insufficient documentation

## 2016-07-27 DIAGNOSIS — R4189 Other symptoms and signs involving cognitive functions and awareness: Secondary | ICD-10-CM | POA: Insufficient documentation

## 2016-07-27 DIAGNOSIS — D849 Immunodeficiency, unspecified: Secondary | ICD-10-CM

## 2016-07-27 DIAGNOSIS — D471 Chronic myeloproliferative disease: Secondary | ICD-10-CM

## 2016-07-27 DIAGNOSIS — Z23 Encounter for immunization: Secondary | ICD-10-CM | POA: Insufficient documentation

## 2016-07-27 LAB — COMPREHENSIVE METABOLIC PANEL
ALT: 36 U/L — ABNORMAL HIGH (ref 0–35)
AST: 28 U/L (ref 0–35)
Albumin: 4.4 g/dL (ref 3.5–5.2)
Alk Phos: 103 U/L (ref 35–105)
Anion Gap: 12 (ref 7–16)
Bilirubin,Total: 0.4 mg/dL (ref 0.0–1.2)
CO2: 28 mmol/L (ref 20–28)
Calcium: 10.1 mg/dL (ref 8.6–10.2)
Chloride: 101 mmol/L (ref 96–108)
Creatinine: 0.84 mg/dL (ref 0.51–0.95)
GFR,Black: 90 *
GFR,Caucasian: 78 *
Glucose: 106 mg/dL — ABNORMAL HIGH (ref 60–99)
Lab: 18 mg/dL (ref 6–20)
Potassium: 4.8 mmol/L (ref 3.3–5.1)
Sodium: 141 mmol/L (ref 133–145)
Total Protein: 6.7 g/dL (ref 6.3–7.7)

## 2016-07-27 LAB — CBC AND DIFFERENTIAL
Baso # K/uL: 0.1 10*3/uL (ref 0.0–0.1)
Basophil %: 0.9 %
Eos # K/uL: 0.6 10*3/uL — ABNORMAL HIGH (ref 0.0–0.4)
Eosinophil %: 10.8 %
Hematocrit: 39 % (ref 34–45)
Hemoglobin: 12.8 g/dL (ref 11.2–15.7)
IMM Granulocytes #: 0 10*3/uL (ref 0.0–0.1)
IMM Granulocytes: 0.6 %
Lymph # K/uL: 1 10*3/uL — ABNORMAL LOW (ref 1.2–3.7)
Lymphocyte %: 19 %
MCH: 31 pg/cell (ref 26–32)
MCHC: 33 g/dL (ref 32–36)
MCV: 96 fL — ABNORMAL HIGH (ref 79–95)
Mono # K/uL: 0.6 10*3/uL (ref 0.2–0.9)
Monocyte %: 11.9 %
Neut # K/uL: 3 10*3/uL (ref 1.6–6.1)
Nucl RBC # K/uL: 0 10*3/uL (ref 0.0–0.0)
Nucl RBC %: 0 /100 WBC (ref 0.0–0.2)
Platelets: 193 10*3/uL (ref 160–370)
RBC: 4.1 MIL/uL (ref 3.9–5.2)
RDW: 13.4 % (ref 11.7–14.4)
Seg Neut %: 56.8 %
WBC: 5.4 10*3/uL (ref 4.0–10.0)

## 2016-07-27 LAB — MAGNESIUM: Magnesium: 1.8 mEq/L (ref 1.3–2.1)

## 2016-07-27 LAB — LACTATE DEHYDROGENASE: LD: 187 U/L (ref 118–225)

## 2016-07-27 LAB — NEUTROPHIL #-INSTRUMENT: Neutrophil #-Instrument: 3 10*3/uL

## 2016-07-27 MED ORDER — PNEUMOCOCCAL 13-VAL CONJ VACC IM SUSP *I*
0.5000 mL | Freq: Once | INTRAMUSCULAR | Status: DC
Start: 2016-07-27 — End: 2016-07-27
  Filled 2016-07-27: qty 0.5

## 2016-07-27 MED ORDER — DIPHTH-ACELL PERT-TETANUS 25-58-10 LF-MCG/0.5 IM SUSP *I*
0.5000 mL | Freq: Once | INTRAMUSCULAR | Status: DC
Start: 2016-07-27 — End: 2016-07-27
  Filled 2016-07-27: qty 0.5

## 2016-07-27 MED ORDER — POLIOVIRUS VACCINE INACTIVATED SC INJ *I*
0.5000 mL | INJECTION | Freq: Once | INTRAMUSCULAR | Status: DC
Start: 2016-07-27 — End: 2016-07-27
  Filled 2016-07-27: qty 0.5

## 2016-07-27 MED ORDER — HAEMOPHILUS B POLYSAC CONJ VAC 10-25 MCG IM SOLR *I*
0.5000 mL | Freq: Once | INTRAMUSCULAR | Status: DC
Start: 2016-07-27 — End: 2016-07-27
  Filled 2016-07-27: qty 0.5

## 2016-07-27 MED ORDER — HEPATITIS B VAC RECOMBINANT 20 MCG/ML IJ VIAL/SYR *WRAPPED*
20.0000 ug | Freq: Once | INTRAMUSCULAR | Status: DC
Start: 2016-07-27 — End: 2016-07-27
  Filled 2016-07-27: qty 1

## 2016-07-27 MED ORDER — MENINGOCOCCAL A C Y&W-135 OLIG IM SOLR *I*
0.5000 mL | Freq: Once | INTRAMUSCULAR | Status: DC
Start: 2016-07-27 — End: 2016-07-27
  Filled 2016-07-27: qty 0.5

## 2016-07-27 NOTE — Progress Notes (Signed)
Patient arrived to infusion center, stable, here for 12 months post BMT immunizations. Vitals reviewed and stbale, no new complaints today. Immunizations released from treatment plan. Pt placed in waiting area until injection arrival. At 1030 patient called writer to ask why it was taking so long? Writer explained the process of getting immunizations over to infusion center from inpatient pharmacy. Pt stated that she could not wait any longer because she had another appointment to get to that was 40 minutes away. Writer informed pharmacy to hold vaccines at this time. Pt's appointment has been re-scheduled for 7/25 and immunizations are re-ordered in plan. Encouraged patient to call when on way to infusion center on Wednesday 7/25 to allow pharmacy more time to prep immunizations. Patient verbalized understanding. Discharged to next appointment in stable condition.

## 2016-08-01 ENCOUNTER — Ambulatory Visit
Admission: RE | Admit: 2016-08-01 | Discharge: 2016-08-01 | Disposition: A | Discharge status: Home / Self Care | Payer: No Typology Code available for payment source | Source: Ambulatory Visit | Attending: Internal Medicine | Admitting: Internal Medicine

## 2016-08-01 ENCOUNTER — Ambulatory Visit: Payer: No Typology Code available for payment source

## 2016-08-01 VITALS — BP 118/69 | HR 74 | Temp 96.1°F | Resp 18

## 2016-08-01 DIAGNOSIS — Z1231 Encounter for screening mammogram for malignant neoplasm of breast: Secondary | ICD-10-CM | POA: Insufficient documentation

## 2016-08-01 DIAGNOSIS — D849 Immunodeficiency, unspecified: Secondary | ICD-10-CM

## 2016-08-01 DIAGNOSIS — Z1239 Encounter for other screening for malignant neoplasm of breast: Secondary | ICD-10-CM

## 2016-08-01 DIAGNOSIS — Z9481 Bone marrow transplant status: Secondary | ICD-10-CM

## 2016-08-01 MED ORDER — MENINGOCOCCAL A C Y&W-135 OLIG IM SOLR *I*
0.5000 mL | Freq: Once | INTRAMUSCULAR | Status: AC
Start: 2016-08-01 — End: 2016-08-01
  Administered 2016-08-01: 0.5 mL via INTRAMUSCULAR
  Filled 2016-08-01: qty 0.5

## 2016-08-01 MED ORDER — HAEMOPHILUS B POLYSAC CONJ VAC 10-25 MCG IM SOLR *I*
0.5000 mL | Freq: Once | INTRAMUSCULAR | Status: AC
Start: 2016-08-01 — End: 2016-08-01
  Administered 2016-08-01: 0.5 mL via INTRAMUSCULAR
  Filled 2016-08-01: qty 0.5

## 2016-08-01 MED ORDER — HEPATITIS B VAC RECOMBINANT 20 MCG/ML IJ VIAL/SYR *WRAPPED*
20.0000 ug | Freq: Once | INTRAMUSCULAR | Status: AC
Start: 2016-08-01 — End: 2016-08-01
  Administered 2016-08-01: 20 ug via INTRAMUSCULAR
  Filled 2016-08-01: qty 1

## 2016-08-01 MED ORDER — POLIOVIRUS VACCINE INACTIVATED SC INJ *I*
0.5000 mL | INJECTION | Freq: Once | INTRAMUSCULAR | Status: AC
Start: 2016-08-01 — End: 2016-08-01
  Administered 2016-08-01: 0.5 mL via SUBCUTANEOUS
  Filled 2016-08-01: qty 0.5

## 2016-08-01 MED ORDER — PNEUMOCOCCAL 13-VAL CONJ VACC IM SUSP *I*
0.5000 mL | Freq: Once | INTRAMUSCULAR | Status: AC
Start: 2016-08-01 — End: 2016-08-01
  Administered 2016-08-01: 0.5 mL via INTRAMUSCULAR
  Filled 2016-08-01: qty 0.5

## 2016-08-01 MED ORDER — DIPHTH-ACELL PERT-TETANUS 25-58-10 LF-MCG/0.5 IM SUSP *I*
0.5000 mL | Freq: Once | INTRAMUSCULAR | Status: AC
Start: 2016-08-01 — End: 2016-08-01
  Administered 2016-08-01: 0.5 mL via INTRAMUSCULAR
  Filled 2016-08-01: qty 0.5

## 2016-08-01 NOTE — Progress Notes (Signed)
Pt tolerated 12 month post BMT vaccines in bilateral arms without issue. VIS forms given. Muscle movement and tylenol encouraged for muscle soreness. Pt discharged home in stable condition.

## 2016-08-03 DIAGNOSIS — R4189 Other symptoms and signs involving cognitive functions and awareness: Secondary | ICD-10-CM | POA: Insufficient documentation

## 2016-08-03 NOTE — Progress Notes (Signed)
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, immunocompromised, at risk for GVHD    PMH, Problem List  Reviewed and updated as needed    Interval History/Review of Systems  Carly Higgins reports her bilateral hip pain is better today but pain cont to flucuate   Did not like how tramadol made her feel, not sure if helped  Concerned about short term memory nit as good and has trouble finding words at times, knows want she wants to say but cant remember word  Fatigue stable   No fevers chills   No N/V or diarrhea no abd pain   No cough or SOB  No rash  No mouth sores or difficulty swalling  Mouth less dry   No eye discomfort or visual changes  Starting immunizations,stopping actigal, stopping diflucan   Follow up 2 months    Other pertinent findings checked/documented below     A comprehensive review of 13 systems was performed; pertinent positives checked below:  []  fevers  []  fast pulse []  decreased appetite  []  weakness   []  chills []  chest pain []  altered taste  []  poor mobility    []  dry eyes []  nausea   []  insufficient oral intake  []  impaired ADLs   []  visual changes  []  emesis  [x]  skin changes dry []  insomnia    []  headache  []  abdominal cramping  []  rash  []  leg edema better   []  sores in mouth []  abdominal discomfort []  urinary frequency [x]  short term memory changes    []  sore tongue  []  diarrhea, watery []  dysuria and hematuria  []  anxiety    []  cough   []  loose stool, non-formed []  bleeding  []  poor coping    []  sputum production  []  constipation   [x]  pain joints hip   []  neuropathy   []  shortness of breath/DOE   []   weight loss  [x]  fatigue   []  none of the above      Patient Active Problem List   Diagnosis Code    MPN (myeloproliferative neoplasm) D47.1    Myeloproliferative disease D47.1    post  allogenic MUD (F)  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 Z94.81    Immunocompromised D84.9    Hyperkalemia E87.5    Bilateral hip pain. M25.551, M25.552    A-fib I48.91       Medications  Reviewed medications with patient today & electronically reconciled  Current Outpatient Prescriptions   Medication Sig Note    acyclovir (ZOVIRAX) 400 MG tablet TAKE 1 TABLET (400 MG TOTAL) BY MOUTH 2 TIMES DAILY     traMADol (ULTRAM) 50 MG tablet Take 1 tablet (50 mg total) by mouth every 6 hours as needed for Pain   Max daily dose: 200 mg     pantoprazole (PROTONIX) 40 MG EC tablet TAKE 1 TABLET (40 MG TOTAL) BY MOUTH 2 TIMES DAILY SWALLOW WHOLE. DO NOT CRUSH, BREAK, OR CHEW.     escitalopram (LEXAPRO) 20 MG tablet Take 20 mg by mouth daily 05/15/2016: Received from: External Pharmacy Received Sig: TAKE 1 TABLETS ORALLY EVERY DAY    metoprolol (LOPRESSOR) 50 MG tablet Take 1 tablet (50 mg total) by mouth 2 times daily  aspirin 81 MG EC tablet Take 81 mg by mouth daily     ibuprofen (ADVIL,MOTRIN) 200 MG tablet Take 600 mg by mouth 3 times daily as needed for Pain        tacrolimus (PROGRAF) 0.5 MG capsule Take 1 capsule (0.5 mg total) by mouth 2 times daily (Patient taking differently: Take 0.5 mg by mouth daily   ) 06/07/2016: Since May 8 0.5/0.5    ondansetron (ZOFRAN) 4 MG tablet Take 1 tablet (4 mg total) by mouth 3 times daily as needed (nausea)     biotin 5 MG tablet Take 5 mg by mouth daily     prochlorperazine (COMPAZINE) 10 MG tablet Take 1 tablet (10 mg total) by mouth 4 times daily as needed for Nausea      No current facility-administered medications for this visit.          Recent labs - reviewed  Results for Carly, Higgins (MRN [id number])    Ref. Range 07/27/2016 08:20   LD Latest Ref Range: 118 - 225 U/L 187   Sodium Latest Ref Range: 133 - 145 mmol/L 141   Potassium Latest Ref Range: 3.3 - 5.1 mmol/L 4.8   Chloride Latest Ref Range: 96 - 108 mmol/L 101   CO2 Latest Ref Range: 20 - 28 mmol/L 28   Anion Gap Latest  Ref Range: 7 - 16  12   UN Latest Ref Range: 6 - 20 mg/dL 18   Creatinine Latest Ref Range: 0.51 - 0.95 mg/dL 0.84   GFR,Black Latest Units: * 90   GFR,Caucasian Latest Units: * 78   Glucose Latest Ref Range: 60 - 99 mg/dL 106 (H)   Calcium Latest Ref Range: 8.6 - 10.2 mg/dL 10.1   Magnesium Latest Ref Range: 1.3 - 2.1 mEq/L 1.8   Total Protein Latest Ref Range: 6.3 - 7.7 g/dL 6.7   Albumin Latest Ref Range: 3.5 - 5.2 g/dL 4.4   ALT Latest Ref Range: 0 - 35 U/L 36 (H)   AST Latest Ref Range: 0 - 35 U/L 28   Alk Phos Latest Ref Range: 35 - 105 U/L 103   Bilirubin,Total Latest Ref Range: 0.0 - 1.2 mg/dL 0.4   WBC Latest Ref Range: 4.0 - 10.0 THOU/uL 5.4   RBC Latest Ref Range: 3.9 - 5.2 MIL/uL 4.1   Hemoglobin Latest Ref Range: 11.2 - 15.7 g/dL 12.8   Hematocrit Latest Ref Range: 34 - 45 % 39   MCV Latest Ref Range: 79 - 95 fL 96 (H)   MCH Latest Ref Range: 26 - 32 pg/cell 31   MCHC Latest Ref Range: 32 - 36 g/dL 33   RDW Latest Ref Range: 11.7 - 14.4 % 13.4   Platelets Latest Ref Range: 160 - 370 THOU/uL 193   Neut # K/uL Latest Ref Range: 1.6 - 6.1 THOU/uL 3.0   Neutrophil #-Instrument Latest Units: THOU/uL 3.0   Lymph # K/uL Latest Ref Range: 1.2 - 3.7 THOU/uL 1.0 (L)   Mono # K/uL Latest Ref Range: 0.2 - 0.9 THOU/uL 0.6   Eos # K/uL Latest Ref Range: 0.0 - 0.4 THOU/uL 0.6 (H)   Baso # K/uL Latest Ref Range: 0.0 - 0.1 THOU/uL 0.1   IMM Granulocytes # Latest Ref Range: 0.0 - 0.1 THOU/uL 0.0   Nucl RBC # K/uL Latest Ref Range: 0.0 - 0.0 THOU/uL 0.0   Seg Neut % Latest Units: % 56.8   Lymphocyte % Latest Units: % 19.0  Monocyte % Latest Units: % 11.9   Eosinophil % Latest Units: % 10.8   Basophil % Latest Units: % 0.9   IMM Granulocytes Latest Units: % 0.6   Nucl RBC % Latest Ref Range: 0.0 - 0.2 /100 WBC 0.0       Wt Readings from Last 3 Encounters:   07/27/16 116.6 kg (257 lb 0.9 oz)   07/05/16 116.8 kg (257 lb 9.7 oz)   06/07/16 115.4 kg (254 lb 4.8 oz)     Physical Exam   BP 127/58 (BP Location: Left arm, Patient  Position: Sitting, Cuff Size: adult)   Pulse 64   Temp 35.2 C (95.4 F) (Temporal)    Resp 18   Wt 116.6 kg (257 lb 0.9 oz)   SpO2 97%   BMI 41.51 kg/m2  KPS  80-90%  Pain 0/10   No acute distress, non-toxic appearing, cooperative with exam   Appropriate affect and dress with normal speech and motor activity  scalp atraumatic, hair grown back    PERRLA- EOMI. Conjunctiva & corneas clear, no eye drainage. Sclera anicteric OU  Lips, oral mucosa and tongue mildly dry, without lesions; no postpharyngeal exudate no lichenoid changes    Neck is supple, no palp LN   Normal S1 & S2, regular, no murmur, rub, or gallop  Respirations unlabored, lung sounds clear to A&P bilateral auscultation  Bowel sounds are normoactive in four quadrants; abdomen is soft, non-tender, and without masses or organomegaly  Radial pulses +2 and symmetric, CMS is intact  Trace non pitting bilat lower extremity edema, limited mobility at times with shoulder hip pain   Neuro AOx3, speech/language WNL, moving all extremities.  Non-focal exam  Skin warm dry intact no lesions, No rash no nail changes     Assessment   Pleasant  56 yo female with MPN post allogeneic MUD (F) PBSCT, here for follow up. No clinical signs of infection or GVHD. On IST taper, cont Tacrolimus at 0.5 mg daily. LFTs (enzymes) stable. Blood counts WNL. Start immunizations Consider Dexa scan, in Sept..     Plan  MPN post HSCT  Regimen: Fludarabine 40 mg/m2/d and Busulfan 130 mg/m2/d x 4 days   Transplant type: Allogeneic MUD PBSC 10/10 56 yr old female ABO : A+/O\A+. CMV titer:N/N  Transplant day 0:  05/17/15  Transplant day:  14 months   Day 30 marrow 6/6 The hypercellularity and architectural pattern is somewhat unusual, but no definitive evidence of malignancy is seen. Chimerism results indicate only low level (2%) recipient DNA.   Day 100 marrow 09/15/15 chimerism still 2%, no MDS  chimerism 11/21 trace recipient  1 yr marrow 05/15/16 no MDS,   Chimerism 0% recipient. Normal  Karyotype     Hematology:   Blood Counts stable WNL  ANC WNL  PLT WNL  No signs of bleeding.  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)  Completed all doses of MTX  Budesonide tapered to daily on 05/15/16 - stopped 6/28  Tacrolimus  (0.5 mg BID 05/15/16 to 07/05/16)  0.5 mg daily 6/29- to date   Keep current dose   No clinical signs of GVHD   LFTs stable mild elevation ALT 36    No GVHD grade 0    ID:  Afebrile   No clinical signs of active infection  Immunocompromised  CMV PCR not required both negative  Cont ppx acyclovir  Cont ppx diflucan- discontinued today 7/20  PCP ppx dapsone   -stopped 6/28  Re-immunizations at 1 yr post BMT as able to give - on minimal Tacro can start-  Started today, but delay in infusion will come back 7/25      Cardiovascular/pulmonary:   BP WNL  HR stable   Cont  metoprolol for now - H/O afib  Cardiology follow up as needed   No leg edema     Has not smoked in over 1 year, smoking cessation clinic as needed   Last  PFTs March 2018- no sig change from baseline- isolated reduction in diffusing capacity- c/w pulm vasc disease   ECHO - 11/2015, consider repeating in fall 2018     Renal/FEN:   Creat  WNL  No dysuria or hematuria  Other lytes stable    Gastrointestinal:   Denies nausea      No diarrhea.   Cont protonix   Weight stable  LFTs mild transamitis - ALT 36 -likely medication   actigal likely not beneficial discontinued today   Consider liver US if worsen - ferritin elevated possible iron overload     Endocrine:   Stable.   Eventually bone health meds and Dexa scan at 1 yr - will discuss next visit  Last TFTs May 2018 TSH high Free T 4 normal - no medication at tis time recheck in 3-6 months - after August     Neurologic:   Nonfocal; no deficits.   No H/A or visual changes.   No neuro changes on Tacro   Having some short term memory issues and word find at times  If worsens will consider neuro consult for cognitive evaluation    Hip pain flucuates, ibuprofen not helpful   Tried tramadol did nit like how it made her feel  Recent plain films hips negative for AVN, fx   Consider orthopedic consult   encouraged physical therapy     Musculoskeletal:   Activity as tolerated, limited at times due to bilat hip pain   Pain flucuates     Derm  Skin intact  No rashes    Aware to limit sun exposure and use sun screen      Psychosocial:   Family supportive.   Continue our support.       BMT attending Lytle Butte   BMT coordinator Meyler   PCP Cahnhidalgo  Adjust plan of care as needed.     Follow up  BMT clinic 8 weeks with labs - sooner if needed  Immunizations   Will call with any issues or concerns   Consider DEXA scam     Irene Pap, NP

## 2016-09-03 ENCOUNTER — Other Ambulatory Visit: Payer: Self-pay | Admitting: Hematology

## 2016-09-03 DIAGNOSIS — Z9481 Bone marrow transplant status: Secondary | ICD-10-CM

## 2016-09-24 ENCOUNTER — Ambulatory Visit: Payer: No Typology Code available for payment source | Admitting: Oncology

## 2016-09-25 ENCOUNTER — Other Ambulatory Visit: Payer: Self-pay | Admitting: Oncology

## 2016-09-25 ENCOUNTER — Ambulatory Visit: Payer: No Typology Code available for payment source

## 2016-09-25 ENCOUNTER — Other Ambulatory Visit
Admission: RE | Admit: 2016-09-25 | Discharge: 2016-09-25 | Disposition: A | Discharge status: Home / Self Care | Payer: No Typology Code available for payment source | Source: Ambulatory Visit | Attending: Oncology | Admitting: Oncology

## 2016-09-25 ENCOUNTER — Ambulatory Visit: Payer: No Typology Code available for payment source | Attending: Oncology | Admitting: Hematology

## 2016-09-25 VITALS — BP 132/63 | HR 55 | Temp 95.4°F | Resp 18 | Wt 260.8 lb

## 2016-09-25 VITALS — Resp 18

## 2016-09-25 DIAGNOSIS — D471 Chronic myeloproliferative disease: Secondary | ICD-10-CM | POA: Insufficient documentation

## 2016-09-25 DIAGNOSIS — C9310 Chronic myelomonocytic leukemia not having achieved remission: Secondary | ICD-10-CM | POA: Insufficient documentation

## 2016-09-25 DIAGNOSIS — Z9481 Bone marrow transplant status: Secondary | ICD-10-CM

## 2016-09-25 DIAGNOSIS — D849 Immunodeficiency, unspecified: Secondary | ICD-10-CM | POA: Insufficient documentation

## 2016-09-25 DIAGNOSIS — Z23 Encounter for immunization: Secondary | ICD-10-CM | POA: Insufficient documentation

## 2016-09-25 LAB — LACTATE DEHYDROGENASE: LD: 207 U/L (ref 118–225)

## 2016-09-25 LAB — CBC AND DIFFERENTIAL
Baso # K/uL: 0.1 10*3/uL (ref 0.0–0.1)
Basophil %: 0.9 %
Eos # K/uL: 0.6 10*3/uL — ABNORMAL HIGH (ref 0.0–0.4)
Eosinophil %: 11.6 %
Hematocrit: 38 % (ref 34–45)
Hemoglobin: 12.4 g/dL (ref 11.2–15.7)
IMM Granulocytes #: 0 10*3/uL (ref 0.0–0.1)
IMM Granulocytes: 0.7 %
Lymph # K/uL: 1.3 10*3/uL (ref 1.2–3.7)
Lymphocyte %: 23.4 %
MCH: 31 pg/cell (ref 26–32)
MCHC: 33 g/dL (ref 32–36)
MCV: 94 fL (ref 79–95)
Mono # K/uL: 0.6 10*3/uL (ref 0.2–0.9)
Monocyte %: 11.4 %
Neut # K/uL: 2.9 10*3/uL (ref 1.6–6.1)
Nucl RBC # K/uL: 0 10*3/uL (ref 0.0–0.0)
Nucl RBC %: 0 /100 WBC (ref 0.0–0.2)
Platelets: 192 10*3/uL (ref 160–370)
RBC: 4 MIL/uL (ref 3.9–5.2)
RDW: 13.2 % (ref 11.7–14.4)
Seg Neut %: 52 %
WBC: 5.5 10*3/uL (ref 4.0–10.0)

## 2016-09-25 LAB — COMPREHENSIVE METABOLIC PANEL
ALT: 35 U/L (ref 0–35)
AST: 29 U/L (ref 0–35)
Albumin: 4.3 g/dL (ref 3.5–5.2)
Alk Phos: 96 U/L (ref 35–105)
Anion Gap: 11 (ref 7–16)
Bilirubin,Total: 0.4 mg/dL (ref 0.0–1.2)
CO2: 26 mmol/L (ref 20–28)
Calcium: 9.5 mg/dL (ref 8.6–10.2)
Chloride: 101 mmol/L (ref 96–108)
Creatinine: 0.82 mg/dL (ref 0.51–0.95)
GFR,Black: 92 *
GFR,Caucasian: 80 *
Glucose: 95 mg/dL (ref 60–99)
Lab: 22 mg/dL — ABNORMAL HIGH (ref 6–20)
Potassium: 4.7 mmol/L (ref 3.3–5.1)
Sodium: 138 mmol/L (ref 133–145)
Total Protein: 6.5 g/dL (ref 6.3–7.7)

## 2016-09-25 LAB — NEUTROPHIL #-INSTRUMENT: Neutrophil #-Instrument: 2.9 10*3/uL

## 2016-09-25 LAB — MAGNESIUM: Magnesium: 1.7 mEq/L (ref 1.3–2.1)

## 2016-09-25 MED ORDER — DIPHTH-ACELL PERT-TETANUS 25-58-10 LF-MCG/0.5 IM SUSP *I*
0.5000 mL | Freq: Once | INTRAMUSCULAR | Status: AC
Start: 2016-09-25 — End: 2016-09-25
  Administered 2016-09-25: 0.5 mL via INTRAMUSCULAR
  Filled 2016-09-25: qty 0.5

## 2016-09-25 MED ORDER — POLIOVIRUS VACCINE INACTIVATED SC INJ *I*
0.5000 mL | INJECTION | Freq: Once | INTRAMUSCULAR | Status: AC
Start: 2016-09-25 — End: 2016-09-25
  Administered 2016-09-25: 0.5 mL via SUBCUTANEOUS
  Filled 2016-09-25: qty 0.5

## 2016-09-25 MED ORDER — INFLUENZA VAC SPLIT QUAD (FLULAVAL) 0.5 ML IM SUSY PF(>=6 MONTHS) *I*
0.5000 mL | PREFILLED_SYRINGE | INTRAMUSCULAR | Status: AC
Start: 2016-09-25 — End: 2016-09-25
  Administered 2016-09-25: 0.5 mL via INTRAMUSCULAR
  Filled 2016-09-25: qty 0.5

## 2016-09-25 MED ORDER — PNEUMOCOCCAL 13-VAL CONJ VACC IM SUSP *I*
0.5000 mL | Freq: Once | INTRAMUSCULAR | Status: AC
Start: 2016-09-25 — End: 2016-09-25
  Administered 2016-09-25: 0.5 mL via INTRAMUSCULAR
  Filled 2016-09-25: qty 0.5

## 2016-09-25 MED ORDER — HEPATITIS B VAC RECOMBINANT 20 MCG/ML IJ VIAL/SYR *WRAPPED*
20.0000 ug | Freq: Once | INTRAMUSCULAR | Status: AC
Start: 2016-09-25 — End: 2016-09-25
  Administered 2016-09-25: 20 ug via INTRAMUSCULAR
  Filled 2016-09-25: qty 1

## 2016-09-25 MED ORDER — HAEMOPHILUS B POLYSAC CONJ VAC 10-25 MCG IM SOLR *I*
0.5000 mL | Freq: Once | INTRAMUSCULAR | Status: AC
Start: 2016-09-25 — End: 2016-09-25
  Administered 2016-09-25: 0.5 mL via INTRAMUSCULAR
  Filled 2016-09-25: qty 0.5

## 2016-09-25 NOTE — Progress Notes (Signed)
Pt tolerated 14 months post transplant vaccines in bilateral arms without issue. VIS forms given. Tylenol encouraged for muscle soreness. Pt discharged home in stable condition.

## 2016-09-25 NOTE — Progress Notes (Signed)
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 She complains of feeling fatigued and b/l hip pain which is her significant complaint. Her pain in the hips are worse with movement especially going up and down the stairs and with walking. She also complains of tingling in her left hand and forearm which is bothering her a lot. She is taking Ibuprofen intermittently but do not seem to have much improvement. She had hip x rays done which showed b/l osteoarthritis changes. She has a rash on her chest in the distribution of her bra which started a week ago. She is selling her house due to stairs and now she cannot use stairs. She is going to buy an apartment and in the interim moving into her mom's place.     She denies any fever, chills, nausea, vomiting, diarrhea, abdominal pain, cough, SOB, mouth sores, difficulty swallowing.     Other pertinent findings checked/documented below     A comprehensive review of 13 systems was performed; pertinent positives checked below:  []  fevers  []  fast pulse []  decreased appetite  []  weakness   []  chills []  chest pain []  altered taste  []  poor mobility    []  dry eyes []  nausea   []  insufficient oral intake  []  impaired ADLs   []  visual changes  []  emesis  [x]  skin changes dry []  insomnia    []  headache  []  abdominal cramping  []  rash  []  leg edema better   []  sores in mouth []  abdominal discomfort []  urinary frequency [x]  short term memory changes    []  sore tongue  []  diarrhea, watery []  dysuria and hematuria  []  anxiety    []  cough   []  loose stool, non-formed []  bleeding  []  poor coping    []  sputum production  []  constipation   [x]  pain joints hip   []  neuropathy   []  shortness of breath/DOE   []    weight loss  [x]  fatigue   []  none of the above      Patient Active Problem List   Diagnosis Code    MPN (myeloproliferative neoplasm) D47.1    Myeloproliferative disease D47.1    post allogenic MUD (F)  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 Z94.81    Immunocompromised D84.9    Hyperkalemia E87.5    Bilateral hip pain. M25.551, M25.552    A-fib I48.91    Cognitive impairment R41.89       Medications  Reviewed medications with patient today & electronically reconciled  Current Outpatient Prescriptions   Medication Sig Note    metoprolol (LOPRESSOR) 50 MG tablet TAKE 1 TABLET (50 MG TOTAL) BY MOUTH 2 TIMES DAILY     acyclovir (ZOVIRAX) 400 MG tablet TAKE 1 TABLET (400 MG TOTAL) BY MOUTH 2 TIMES DAILY     traMADol (ULTRAM) 50 MG tablet Take 1 tablet (50 mg total) by mouth every 6 hours as needed for Pain   Max daily dose: 200 mg     pantoprazole (PROTONIX) 40 MG EC tablet TAKE 1 TABLET (40 MG TOTAL) BY MOUTH 2 TIMES DAILY SWALLOW WHOLE. DO NOT CRUSH, BREAK, OR CHEW.     tacrolimus (PROGRAF) 0.5 MG capsule  Take 1 capsule (0.5 mg total) by mouth 2 times daily (Patient taking differently: Take 0.5 mg by mouth daily   ) 06/07/2016: Since May 8 0.5/0.5    escitalopram (LEXAPRO) 20 MG tablet Take 20 mg by mouth daily 05/15/2016: Received from: External Pharmacy Received Sig: TAKE 1 TABLETS ORALLY EVERY DAY    ondansetron (ZOFRAN) 4 MG tablet Take 1 tablet (4 mg total) by mouth 3 times daily as needed (nausea)     aspirin 81 MG EC tablet Take 81 mg by mouth daily     ibuprofen (ADVIL,MOTRIN) 200 MG tablet Take 600 mg by mouth 3 times daily as needed for Pain        biotin 5 MG tablet Take 5 mg by mouth daily     prochlorperazine (COMPAZINE) 10 MG tablet Take 1 tablet (10 mg total) by mouth 4 times daily as needed for Nausea      No current facility-administered medications for this visit.          Recent labs - reviewed  Recent Results (from the past 24 hour(s))   CBC and differential    Collection Time: 09/25/16 11:02 AM    Result Value Ref Range    WBC 5.5 4.0 - 10.0 THOU/uL    RBC 4.0 3.9 - 5.2 MIL/uL    Hemoglobin 12.4 11.2 - 15.7 g/dL    Hematocrit 38 34 - 45 %    MCV 94 79 - 95 fL    MCH 31 26 - 32 pg/cell    MCHC 33 32 - 36 g/dL    RDW 13.2 11.7 - 14.4 %    Platelets 192 160 - 370 THOU/uL    Seg Neut % 52.0 %    Lymphocyte % 23.4 %    Monocyte % 11.4 %    Eosinophil % 11.6 %    Basophil % 0.9 %    Neut # K/uL 2.9 1.6 - 6.1 THOU/uL    Lymph # K/uL 1.3 1.2 - 3.7 THOU/uL    Mono # K/uL 0.6 0.2 - 0.9 THOU/uL    Eos # K/uL 0.6 (H) 0.0 - 0.4 THOU/uL    Baso # K/uL 0.1 0.0 - 0.1 THOU/uL    Nucl RBC % 0.0 0.0 - 0.2 /100 WBC    Nucl RBC # K/uL 0.0 0.0 - 0.0 THOU/uL    IMM Granulocytes # 0.0 0.0 - 0.1 THOU/uL    IMM Granulocytes 0.7 %   Comprehensive metabolic panel    Collection Time: 09/25/16 11:02 AM   Result Value Ref Range    Sodium 138 133 - 145 mmol/L    Potassium 4.7 3.3 - 5.1 mmol/L    Chloride 101 96 - 108 mmol/L    CO2 26 20 - 28 mmol/L    Anion Gap 11 7 - 16    UN 22 (H) 6 - 20 mg/dL    Creatinine 0.82 0.51 - 0.95 mg/dL    GFR,Caucasian 80 *    GFR,Black 92 *    Glucose 95 60 - 99 mg/dL    Calcium 9.5 8.6 - 10.2 mg/dL    Total Protein 6.5 6.3 - 7.7 g/dL    Albumin 4.3 3.5 - 5.2 g/dL    Bilirubin,Total 0.4 0.0 - 1.2 mg/dL    AST 29 0 - 35 U/L    ALT 35 0 - 35 U/L    Alk Phos 96 35 - 105 U/L   Lactate dehydrogenase    Collection Time:  09/25/16 11:02 AM   Result Value Ref Range    LD 207 118 - 225 U/L   Magnesium    Collection Time: 09/25/16 11:02 AM   Result Value Ref Range    Magnesium 1.7 1.3 - 2.1 mEq/L   Neutrophil #-Instrument    Collection Time: 09/25/16 11:02 AM   Result Value Ref Range    Neutrophil #-Instrument 2.9 THOU/uL           Wt Readings from Last 3 Encounters:   09/25/16 118.3 kg (260 lb 12.9 oz)   07/27/16 116.6 kg (257 lb 0.9 oz)   07/05/16 116.8 kg (257 lb 9.7 oz)     Physical Exam   BP 132/63 (BP Location: Left arm, Patient Position: Sitting, Cuff Size: adult)   Pulse 55   Temp 35.2 C (95.4 F) (Temporal)     Resp 18   Wt 118.3 kg (260 lb 12.9 oz)   SpO2 95%   BMI 42.12 kg/m2  KPS  80-90%    No acute distress, non-toxic appearing, cooperative with exam   Appropriate affect and dress with normal speech and motor activity  scalp atraumatic, hair grown back    PERRLA- EOMI. Conjunctiva & corneas clear, no eye drainage. Sclera anicteric OU  Lips, oral mucosa and tongue mildly dry, without lesions; no postpharyngeal exudate no lichenoid changes    Neck is supple, no palp LN   Normal S1 & S2, regular, no murmur, rub, or gallop  Respirations unlabored, lung sounds clear to A&P bilateral auscultation  Bowel sounds are normoactive in four quadrants; abdomen is soft, non-tender, and without masses or organomegaly  Radial pulses +2 and symmetric, CMS is intact  Trace non pitting bilat lower extremity edema, limited mobility at times with shoulder hip pain   Neuro AOx3, speech/language WNL, moving all extremities.  Non-focal exam  Skin warm dry intact no lesions, No rash no nail changes     Assessment   56 yo female with MPN post allogeneic MUD (F) PBSCT, here for follow up.   No clinical signs of infection or GVHD.   On IST taper, taper down Tacrolimus 0.5 mg every other day.   LFT's are stable. Blood counts WNL.   She is getting her immunizations again today.   Consider Dexa scan during next visit.     Plan  MPN post HSCT  Regimen: Fludarabine 40 mg/m2/d and Busulfan 130 mg/m2/d x 4 days   Transplant type: Allogeneic MUD PBSC 10/10 56 yr old female ABO : A+/O\A+. CMV titer:N/N  Transplant day 0:  05/17/15  Transplant day:  16 months   Day 30 marrow 6/6 The hypercellularity and architectural pattern is somewhat unusual, but no definitive evidence of malignancy is seen. Chimerism results indicate only low level (2%) recipient DNA.   Day 100 marrow 09/15/15 chimerism still 2%, no MDS  chimerism 11/21 trace recipient  1 yr marrow 05/15/16 no MDS,   Chimerism 0% recipient. Normal Karyotype     Hematology:   Blood Counts stable WNL  ANC  WNL  PLT WNL  No signs of bleeding.  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)  Completed all doses of MTX  Budesonide tapered to daily on 05/15/16 - stopped 6/28  Tacrolimus  (0.5 mg BID 05/15/16 to 07/05/16)  0.5 mg daily 6/29- 09/25/16.  Will further taper down on the Tacrolimus to 0.5 mg every other day  from 09/26/16  No clinical signs of GVHD   LFTs stable.  No GVHD grade 0    ID:  Afebrile   No clinical signs of active infection  Immunocompromised  CMV PCR not required both negative  Cont ppx acyclovir    Cont ppx diflucan- discontinued today 7/20  PCP ppx dapsone   -stopped 6/28  Re-immunizations at 1 yr post BMT     Cardiovascular/pulmonary:   BP WNL  HR stable   Cont  metoprolol for now - H/O afib  Cardiology follow up as needed   No leg edema     Has not smoked in over 1 year, smoking cessation clinic as needed   Last  PFTs March 2018- no sig change from baseline- isolated reduction in diffusing capacity- c/w pulm vasc disease   ECHO - 11/2015, consider repeating in fall 2018     Renal/FEN:   Creat  WNL  No dysuria or hematuria  Other lytes stable    Gastrointestinal:   Denies nausea      No diarrhea.   Cont protonix   Weight stable  LFTs stable  Monitor Ferritin.    Endocrine:   Stable.   Eventually bone health meds and Dexa scan at 1 yr - will discuss next visit  Last TFTs May 2018 TSH high Free T 4 normal. Will recheck the TFT's next visit.     Neurologic:   Nonfocal; no deficits.   No H/A or visual changes.   No neuro changes on Tacro   Having some short term memory issues and word find at times  If worsens will consider neuro consult for cognitive evaluation     Musculoskeletal:   Activity as tolerated, limited at times due to bilat hip pain   Pain fluctuates. This likely looks related to osteoarthritis.   She is seeing her PCP next month.  Using NSAID's as needed, but not very helpful.   If pain persists, she might need an MRI to evaluate for AVN, as  she got some steroids during the transplant process.   Also she complained of tingling and numbness in the left wrist, 3rd and 4th finger which is bothering her.   Will refer to orthopedics.     Derm  Skin intact  Has rash near the bra line x 1 week. Asked her to keep the area.   Aware to limit sun exposure and use sun screen      Psychosocial:   Family supportive.   Continue our support.       BMT attending Lytle Butte   BMT coordinator Meyler   PCP Cahnhidalgo  Adjust plan of care as needed.     Follow up  BMT clinic 8 weeks with labs - sooner if needed  Immunizations   Will call with any issues or concerns   Consider DEXA scan.    Patient seen and discussed with [name].     Carly Higgins Carly Higgins, MBBS   Hematology Oncology Fellow, [phone]

## 2016-10-04 ENCOUNTER — Telehealth: Payer: Self-pay | Admitting: Hematology

## 2016-10-04 DIAGNOSIS — R110 Nausea: Secondary | ICD-10-CM

## 2016-10-04 DIAGNOSIS — Z9481 Bone marrow transplant status: Secondary | ICD-10-CM

## 2016-10-04 MED ORDER — ONDANSETRON HCL 4 MG PO TABS *I*
4.0000 mg | ORAL_TABLET | Freq: Three times a day (TID) | ORAL | 3 refills | Status: DC | PRN
Start: 2016-10-04 — End: 2017-12-26

## 2016-10-08 ENCOUNTER — Telehealth: Payer: Self-pay | Admitting: Hematology

## 2016-10-10 ENCOUNTER — Telehealth: Payer: Self-pay | Admitting: Hematology

## 2016-10-10 NOTE — Telephone Encounter (Signed)
Patient lives in [place] and is requesting the appt to see [facility] be closer to her house.   I confirmed with our scheduler that Dr. Duwayne Heck see patients at only a couple of locations.  Patient said her appt is in [place]. I explained that her other option would be [facility].  Carly Higgins decided to keep the appointment at the original location.

## 2016-10-16 ENCOUNTER — Ambulatory Visit: Payer: No Typology Code available for payment source | Attending: Hematology | Admitting: Orthopedic Surgery

## 2016-10-16 ENCOUNTER — Encounter: Payer: Self-pay | Admitting: Orthopedic Surgery

## 2016-10-16 VITALS — BP 139/68 | HR 62 | Ht 66.0 in | Wt 260.0 lb

## 2016-10-16 DIAGNOSIS — G5602 Carpal tunnel syndrome, left upper limb: Secondary | ICD-10-CM

## 2016-10-16 NOTE — Patient Instructions (Signed)
Dear Carly Higgins,    Your physician has determined that you require durable medical equipment (DME) as a part of your treatment.  Knee braces, cast boots, walking boots, crutches, etc. Are DME.  These items offer protection and provide for your safety.  The type and quality of DME has been prescribed for you by your provider.      We cannot determine how much of the cost of this product will be paid by your insurance carrier.  Therefore, you may receive a bill for the outstanding balance.  It is your responsibility to pay whatever fee your insurance carrier does not.    DME Return Policy:     Braces and boots are not returnable due to hygiene concerns.   Poorly fitting braces can be exchanged for a correct fit within 1 week if the DME is in excellent condition.   DME that was not dispensed by [facility] will not be accepted.    Special order braces are billed at the time of order and are non-refundable.   Brace parts can be ordered and replaced if they become damaged or worn out.  This may include a charge.    Your type of brace: 7in Lace-Up Wrist Support (LT/M)    Patient Signature: __________________________  10/16/2016

## 2016-10-16 NOTE — Progress Notes (Signed)
Subjective:      Carly Higgins is a 56 y.o. female who presents with pain in hands, hand paresthesias and numbness and tingling of ring and middle finger. Onset of the symptoms was 2 months ago. Current symptoms include pain involving the left hanfd  and tingling/numbness involving the left hand. Aggravating factors: worse at night or first thing in the morning and worse with activity. Symptoms have gradually worsened. Evaluation to date: none. Treatment to date: wrist splints used for a few weeks, which has been not very effective.  Associated disorders: none.  Symptoms improve with: alleviation, hanging hands down and shaking hands.    Patient's medications, allergies, past medical, surgical, social and family histories were reviewed and updated as appropriate.    Review of Systems  Pertinent items are noted in HPI.      Objective:      BP 139/68   Pulse 62   Ht 1.676 m (5\' 6" )   Wt 117.9 kg (260 lb)   BMI 41.97 kg/m2  Right wrist:  normal   Left wrist:  carpal tunnel compression test is positive, no thenar or hypothenar atrophy, Phalen's: positive, sensation diminished on the ring and middle finger aspect of the hand, Tinel's sign: positive and wrist has normal range of motion       Assessment:      Carpal tunnel syndrome      Plan:      Discussed the diagnosis with the patient and all questions answered.  Fit with wrist splints.  Schedule electromyography (EMG) and nerve conduction velocity (NCV) tests.  Follow up after EMG

## 2016-11-06 ENCOUNTER — Telehealth: Payer: Self-pay | Admitting: Hematology

## 2016-11-06 ENCOUNTER — Other Ambulatory Visit: Payer: Self-pay | Admitting: Hematology

## 2016-11-06 DIAGNOSIS — Z9481 Bone marrow transplant status: Secondary | ICD-10-CM

## 2016-11-06 MED ORDER — ACYCLOVIR 400 MG PO TABS *I*
400.0000 mg | ORAL_TABLET | Freq: Two times a day (BID) | ORAL | 2 refills | Status: DC
Start: 2016-11-06 — End: 2017-11-28

## 2016-11-06 NOTE — Telephone Encounter (Signed)
Refill sent to [name] for signature and will be sent to [facility] as requested.  Patient aware.

## 2016-11-07 ENCOUNTER — Encounter: Payer: Self-pay | Admitting: Physical Medicine and Rehabilitation

## 2016-11-07 ENCOUNTER — Ambulatory Visit
Payer: No Typology Code available for payment source | Attending: Hematology | Admitting: Physical Medicine and Rehabilitation

## 2016-11-07 VITALS — BP 129/56 | HR 54 | Ht 66.0 in | Wt 250.0 lb

## 2016-11-07 DIAGNOSIS — G5602 Carpal tunnel syndrome, left upper limb: Secondary | ICD-10-CM

## 2016-11-07 NOTE — Progress Notes (Signed)
[facility]  [address] [place], [place] [address]    Truett Mainland MD [phone]  Jamesetta Orleans MD [phone]  Test Date:  11/07/2016    Patient: Carly Higgins DOB: [birth year] Physician: Dr. Tonye Becket.   Sex: Female Height: 5\' 6"  Ref Phys: Dr.[email or web address]//Dr.[email or web address]///   ID#: [id number] Weight: 260 lbs.       Patient Complaints: Presents for EMG/NCS of her left upper extremity.    Patient reports that she has been having numbness and tingling in her 3rd and 4th digits of her left hand only for the past 2-3 months.  She first noticed symptoms while she was sleeping at night.   The symptoms gradually got worse and started occurring during the day.  It is worse towards the end of the day or when she has to do activities with her hands.  She is currently packing up her house and this has made her symptoms worse.  She doesnt really endorse pain.  No radiating symptoms from the neck or shoulder.  She has started to wear a wrist splint at night and is finding that helpful.      Examination:  Gen: NAD, pleasant and appears stated age  Spurlings: Negative on the left for radicular symptoms  Motor: 5/5 bilaterally with shoulder abduction, elbow flexion/extension, wrist extension, finger abduction, and thump opposition; no atrophy.  Sensation: Intact to light touch bilaterally over C5-T1 dermatomes  Refelxes: 2+ in biceps, triceps, brachioradialis    NCV & EMG Findings:    Limb temperature monitoring, with limb temperature maintained greater than 32C in the upper extremities and greater than 30C in the lower extremities.      Left median sensory response is abnormal with slow conduction, partial conduction block and axon loss at the wrist.   The left median motor response is abnormal with slow conduction at the wrist.    All remaining nerves (as indicated in the following tables) were within normal limits.   There is a Myrtis Hopping anastomosis in the forearm.       Concentric needle evaluation of the left opponens pollicis muscle showed increased motor unit amplitude and increased motor unit duration.  All other muscles tested were normal.      Impression: This is an abnormal study.  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    Recommendations:  Patient will continue wearing nocturnal wrist spints.    Patient will return to follow up with referring provider, Dr. Patsey Berthold for further management.      ___________________________  Dr. Jamesetta Orleans        Nerve Conduction Studies  Anti Sensory Summary Table     Stim Site NR Onset (ms) Peak (ms) Norm Peak (ms) P-T Amp (V) Norm P-T Amp Site1 Site2 Delta-0 (ms) Dist (cm) Vel (m/s) Norm Vel (m/s)   Left Median Anti Sensory (2nd Digit)   Wrist    4.3 5.8 4 5.9 19 Palm 2nd Digit 1.1 7.0 64    Palm    1.1 1.5 2.3 15.8  Wrist Palm 3.2 7.0 22    Left Ulnar Anti Sensory (5th Digit)   Wrist    2.7 3.2 <4.0 31.7 >13 Wrist 5th Digit 2.7 14.0 52      Motor Summary Table     Stim Site NR Onset (ms) Norm Onset (ms) O-P Amp (mV) Norm O-P Amp Neg Area (mVms) Site1 Site2 Delta-0 (ms) Dist (  cm) Vel (m/s) Norm Vel (m/s)   Left Median Motor (Abd Poll Brev)   Wrist    5.9 4.5 6.7 4.0 18.95 Elbow Wrist 2.2 22.5 102 >49   Elbow    8.1  6.7  20.68         Left Ulnar Motor (Abd Dig Minimi)   Wrist    2.7 3.7 10.4 7.9 24.14 B Elbow Wrist 2.6 17.0 65 >52   B Elbow    5.3  9.2  22.57 A Elbow B Elbow 1.6 10.0 62 >50   A Elbow    6.9  8.0  20.23           Comparison Summary Table     Stim Site NR Onset (ms) Peak (ms) Norm Peak (ms) P-T Amp (V) Site1 Site2 Delta-P (ms) Norm Delta (ms)   Left Median/Radial Dig I Comparison (Digit 1 - 10cm)   Median    4.1 4.8  2.9 Median Digit 1 - 10cm 4.8 <0.5   Radial    2.0 2.2  19.5 Radial Digit 1 - 10cm 2.2      EMG     Side Muscle Nerve Root Ins Act Fibs Fascic Amp Dur Poly Recrt Config Comment   Left Deltoid Axillary C5-6 Nml Nml None Nml Nml  Nml Nml Nml    Left Triceps Radial C6-7-8 Nml Nml None Nml Nml Nml Nml Nml    Left Biceps Musculocut C5-6 Nml Nml None Nml Nml Nml Nml Nml    Left FlexCarRad Median C6-7 Nml Nml None Nml Nml Nml Nml Nml    Left 1stDorInt Ulnar C8-T1 Nml Nml None Nml Nml Nml Nml Nml    Left Opp Pollicis Median M0-N0 Nml Nml None Incr Incr Nml Nml Nml            Waveforms:

## 2016-11-20 ENCOUNTER — Ambulatory Visit: Payer: No Typology Code available for payment source | Admitting: Orthopedic Surgery

## 2016-11-20 ENCOUNTER — Ambulatory Visit: Payer: No Typology Code available for payment source | Admitting: Hematology

## 2016-11-20 ENCOUNTER — Ambulatory Visit: Payer: No Typology Code available for payment source

## 2016-11-22 ENCOUNTER — Other Ambulatory Visit: Payer: No Typology Code available for payment source

## 2016-11-22 ENCOUNTER — Other Ambulatory Visit
Admission: RE | Admit: 2016-11-22 | Discharge: 2016-11-22 | Disposition: A | Discharge status: Home / Self Care | Payer: No Typology Code available for payment source | Source: Ambulatory Visit | Attending: Oncology | Admitting: Oncology

## 2016-11-22 ENCOUNTER — Ambulatory Visit: Payer: No Typology Code available for payment source | Attending: Oncology | Admitting: Hematology

## 2016-11-22 ENCOUNTER — Encounter: Payer: Self-pay | Admitting: Hematology

## 2016-11-22 ENCOUNTER — Ambulatory Visit: Payer: No Typology Code available for payment source

## 2016-11-22 VITALS — BP 144/74 | HR 65 | Temp 97.2°F | Resp 16 | Ht 65.98 in | Wt 251.3 lb

## 2016-11-22 DIAGNOSIS — M87059 Idiopathic aseptic necrosis of unspecified femur: Secondary | ICD-10-CM

## 2016-11-22 DIAGNOSIS — Z9481 Bone marrow transplant status: Secondary | ICD-10-CM

## 2016-11-22 DIAGNOSIS — Z23 Encounter for immunization: Secondary | ICD-10-CM | POA: Insufficient documentation

## 2016-11-22 DIAGNOSIS — D849 Immunodeficiency, unspecified: Secondary | ICD-10-CM

## 2016-11-22 DIAGNOSIS — D471 Chronic myeloproliferative disease: Secondary | ICD-10-CM

## 2016-11-22 LAB — CBC AND DIFFERENTIAL
Baso # K/uL: 0 10*3/uL (ref 0.0–0.1)
Basophil %: 0.6 %
Eos # K/uL: 0.7 10*3/uL — ABNORMAL HIGH (ref 0.0–0.4)
Eosinophil %: 11 %
Hematocrit: 40 % (ref 34–45)
Hemoglobin: 12.8 g/dL (ref 11.2–15.7)
IMM Granulocytes #: 0 10*3/uL (ref 0.0–0.1)
IMM Granulocytes: 0.3 %
Lymph # K/uL: 1.3 10*3/uL (ref 1.2–3.7)
Lymphocyte %: 21.1 %
MCH: 30 pg/cell (ref 26–32)
MCHC: 32 g/dL (ref 32–36)
MCV: 94 fL (ref 79–95)
Mono # K/uL: 0.7 10*3/uL (ref 0.2–0.9)
Monocyte %: 11 %
Neut # K/uL: 3.5 10*3/uL (ref 1.6–6.1)
Nucl RBC # K/uL: 0 10*3/uL (ref 0.0–0.0)
Nucl RBC %: 0 /100 WBC (ref 0.0–0.2)
Platelets: 189 10*3/uL (ref 160–370)
RBC: 4.2 MIL/uL (ref 3.9–5.2)
RDW: 13.1 % (ref 11.7–14.4)
Seg Neut %: 56 %
WBC: 6.2 10*3/uL (ref 4.0–10.0)

## 2016-11-22 LAB — COMPREHENSIVE METABOLIC PANEL
ALT: 67 U/L — ABNORMAL HIGH (ref 0–35)
AST: 49 U/L — ABNORMAL HIGH (ref 0–35)
Albumin: 4.5 g/dL (ref 3.5–5.2)
Alk Phos: 127 U/L — ABNORMAL HIGH (ref 35–105)
Anion Gap: 12 (ref 7–16)
Bilirubin,Total: 0.6 mg/dL (ref 0.0–1.2)
CO2: 27 mmol/L (ref 20–28)
Calcium: 9.6 mg/dL (ref 8.6–10.2)
Chloride: 101 mmol/L (ref 96–108)
Creatinine: 0.82 mg/dL (ref 0.51–0.95)
GFR,Black: 92 *
GFR,Caucasian: 80 *
Glucose: 98 mg/dL (ref 60–99)
Lab: 20 mg/dL (ref 6–20)
Potassium: 4.5 mmol/L (ref 3.3–5.1)
Sodium: 140 mmol/L (ref 133–145)
Total Protein: 6.9 g/dL (ref 6.3–7.7)

## 2016-11-22 LAB — LACTATE DEHYDROGENASE: LD: 206 U/L (ref 118–225)

## 2016-11-22 LAB — MAGNESIUM: Magnesium: 1.7 mEq/L (ref 1.3–2.1)

## 2016-11-22 LAB — NEUTROPHIL #-INSTRUMENT: Neutrophil #-Instrument: 3.5 10*3/uL

## 2016-11-22 MED ORDER — HAEMOPHILUS B POLYSAC CONJ VAC 10-25 MCG IM SOLR *I*
0.5000 mL | Freq: Once | INTRAMUSCULAR | Status: AC
Start: 2016-11-22 — End: 2016-11-22
  Administered 2016-11-22: 0.5 mL via INTRAMUSCULAR
  Filled 2016-11-22: qty 0.5

## 2016-11-22 MED ORDER — POLIOVIRUS VACCINE INACTIVATED SC INJ *I*
0.5000 mL | INJECTION | Freq: Once | INTRAMUSCULAR | Status: AC
Start: 2016-11-22 — End: 2016-11-22
  Administered 2016-11-22: 0.5 mL via SUBCUTANEOUS
  Filled 2016-11-22: qty 0.5

## 2016-11-22 MED ORDER — DIPHTH-ACELL PERT-TETANUS 25-58-10 LF-MCG/0.5 IM SUSP *I*
0.5000 mL | Freq: Once | INTRAMUSCULAR | Status: AC
Start: 2016-11-22 — End: 2016-11-22
  Administered 2016-11-22: 0.5 mL via INTRAMUSCULAR
  Filled 2016-11-22: qty 0.5

## 2016-11-22 MED ORDER — PNEUMOCOCCAL 13-VAL CONJ VACC IM SUSP *I*
0.5000 mL | Freq: Once | INTRAMUSCULAR | Status: AC
Start: 2016-11-22 — End: 2016-11-22
  Administered 2016-11-22: 0.5 mL via INTRAMUSCULAR
  Filled 2016-11-22: qty 0.5

## 2016-11-22 NOTE — Patient Instructions (Signed)
Stop Tacrolimus after you use up current supply.

## 2016-11-22 NOTE — Progress Notes (Signed)
[facility] SAME DAY TREATMENT HAND-OFF:   SITUATION:   Scheduled treatment category for today:     Cancer treatment:  Immunization    Is this a new cancer treatment?: No      Consent obtained:  Not needed    Labs complete:  Not needed    Current patient status:  Scheduled treatment    OK to treat for scheduled treatment:  Yes

## 2016-11-22 NOTE — Progress Notes (Signed)
Patient arrived to clinic in stable condition to receive 16 month Immunizations- no new complaints to report. Patient tolerated injections to both arms well without complication. Encouraged to call with questions/concerns. Discharged in stable condition to home, ambulatory.

## 2016-11-22 NOTE — Progress Notes (Signed)
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 She will be living with her parents until her new apartment is ready. She will be going to [facility] for part of the winter; mostly to drive her parents there.  Tylenol does not work for the pain; nor does tramadol. She tried flexeril, but it made her too sleepy.   -Also has pain over right buttock in area of sciatic notch. She had fallen and had injured the right knee.   -feels dry mouth is improved since using decadron s/s,  - still with SOB but feels this is improving. Has not started smoking again.   -She has been evaluated by orthopedics for median nerve compression; wears splints.      Other pertinent findings checked/documented below  []  fevers  []  palpitations []  urinary frequency   []  chills []  chest pain []  dysuria    [x]  dry mouth []  nausea/emesis  []  rash   []  ear pain []  abdominal cramping []  skin changes   []  dry mouth []  abdominal discomfort []  pain   []  sore throat []  loose stool, non-formed []  fatigue   []  cough  []  diarrhea, watery []  enlarged lymph nodes   []  sputum production  []  weight loss []  Joint pains   [x]  shortness of breath []  insufficient oral intake []  none of the above   Will be seeing eye doctor as right eye vision is blurry.   Medications  Reviewed medications with patient today & electronically reconciled  Current Outpatient Prescriptions on File Prior to Visit   Medication Sig Dispense Refill    acyclovir (ZOVIRAX) 400 MG tablet Take 1 tablet (400 mg total) by mouth 2 times daily 180 tablet 2    ondansetron (ZOFRAN) 4 MG tablet Take 1 tablet (4 mg total) by mouth 3 times daily as needed (nausea) 60 tablet 3     tacrolimus (PROGRAF) 0.5 MG capsule TAKE 1 CAPSULE (0.5 MG TOTAL) BY MOUTH 2 TIMES DAILY 60 capsule 3    metoprolol (LOPRESSOR) 50 MG tablet TAKE 1 TABLET (50 MG TOTAL) BY MOUTH 2 TIMES DAILY 60 tablet 4    traMADol (ULTRAM) 50 MG tablet Take 1 tablet (50 mg total) by mouth every 6 hours as needed for Pain   Max daily dose: 200 mg 60 tablet 0    pantoprazole (PROTONIX) 40 MG EC tablet TAKE 1 TABLET (40 MG TOTAL) BY MOUTH 2 TIMES DAILY SWALLOW WHOLE. DO NOT CRUSH, BREAK, OR CHEW. 60 tablet 3    escitalopram (LEXAPRO) 20 MG tablet Take 20 mg by mouth daily  5    aspirin 81 MG EC tablet Take 81 mg by mouth daily      biotin 5 MG tablet Take 5 mg by mouth daily      prochlorperazine (COMPAZINE) 10 MG tablet Take 1 tablet (10 mg total) by mouth 4 times daily as needed for Nausea 40 tablet 6     No current facility-administered medications on file prior to visit.    Note: she is taking tacro 0.5 mg every other day .    Other clinical data  Review of  EPIC data was performed including Laboratory, Pathology, Radiology and clinical records.     VS, weight, KPS  BP 144/74 (BP Location: Left arm, Patient Position: Sitting, Cuff Size: adult)    Pulse 65    Temp 36.2 C (97.2 F) (Temporal)    Resp 16    Ht 167.6 cm (5' 5.98")    Wt 114 kg (251 lb 5.2 oz)    SpO2 98%    BMI 40.58 kg/m     Wt Readings from Last 3 Encounters:   11/22/16 114 kg (251 lb 5.2 oz)   11/07/16 113.4 kg (250 lb)   10/16/16 117.9 kg (260 lb)       KPS%: 80    Physical Exam   General Appearance:  Mental status  No acute distress, non-toxic appearing  Normal affect, speech, dress, motor activity   Head:  Normocephalic, atraumatic   Eyes:  Conjunctiva/corneas clear, no eye drainage   Oral/Throat: Lips, mucosa, and tongue dry,   no postpharyngeal exudate   Neck: Supple, normal turgor   Heart: S1, S2 normal, regular rhythm, no murmur, rub or gallop   Lungs:   Respirations unlabored, clear to A&P auscultation bilaterally   Abdomen:   Soft, non-tender,  bowel sounds active all four quadrants, tympanic, no masses, no organomegaly   Extremities: No edema; no obvious right knee edema; no tenderness over sciatic notch.    Pulses: Not checked.    Skin: No rashes or lesions     Recent Results (from the past 24 hour(s))   CBC and differential    Collection Time: 11/22/16  2:20 PM   Result Value Ref Range    WBC 6.2 4.0 - 10.0 THOU/uL    RBC 4.2 3.9 - 5.2 MIL/uL    Hemoglobin 12.8 11.2 - 15.7 g/dL    Hematocrit 40 34 - 45 %    MCV 94 79 - 95 fL    MCH 30 26 - 32 pg/cell    MCHC 32 32 - 36 g/dL    RDW 13.1 11.7 - 14.4 %    Platelets 189 160 - 370 THOU/uL    Seg Neut % 56.0 %    Lymphocyte % 21.1 %    Monocyte % 11.0 %    Eosinophil % 11.0 %    Basophil % 0.6 %    Neut # K/uL 3.5 1.6 - 6.1 THOU/uL    Lymph # K/uL 1.3 1.2 - 3.7 THOU/uL    Mono # K/uL 0.7 0.2 - 0.9 THOU/uL    Eos # K/uL 0.7 (H) 0.0 - 0.4 THOU/uL    Baso # K/uL 0.0 0.0 - 0.1 THOU/uL    Nucl RBC % 0.0 0.0 - 0.2 /100 WBC    Nucl RBC # K/uL 0.0 0.0 - 0.0 THOU/uL    IMM Granulocytes # 0.0 0.0 - 0.1 THOU/uL    IMM Granulocytes 0.3 %   Comprehensive metabolic panel    Collection Time: 11/22/16  2:20 PM   Result Value Ref Range    Sodium 140 133 - 145 mmol/L    Potassium 4.5 3.3 - 5.1 mmol/L    Chloride 101 96 - 108 mmol/L    CO2 27 20 - 28 mmol/L    Anion Gap 12 7 - 16    UN 20 6 - 20 mg/dL    Creatinine 0.82 0.51 - 0.95 mg/dL    GFR,Caucasian 80 *    GFR,Black 92 *    Glucose 98 60 -  99 mg/dL    Calcium 9.6 8.6 - 10.2 mg/dL    Total Protein 6.9 6.3 - 7.7 g/dL    Albumin 4.5 3.5 - 5.2 g/dL    Bilirubin,Total 0.6 0.0 - 1.2 mg/dL    AST 49 (H) 0 - 35 U/L    ALT 67 (H) 0 - 35 U/L    Alk Phos 127 (H) 35 - 105 U/L   Lactate dehydrogenase    Collection Time: 11/22/16  2:20 PM   Result Value Ref Range    LD 206 118 - 225 U/L   Magnesium    Collection Time: 11/22/16  2:20 PM   Result Value Ref Range    Magnesium 1.7 1.3 - 2.1 mEq/L   Neutrophil #-Instrument    Collection Time: 11/22/16  2:20 PM   Result Value Ref Range     Neutrophil #-Instrument 3.5 THOU/uL       Assessment and Plan  Carly Higgins is a 56 yo female with unclassifiable myeloproliferative neoplasm and completed Decitabine 43m/m2 IV daily x 5 days - Cycle # 1 12/15/14 and had been on Hydrea prior to transplant. 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     The patient came to clinic today for routine follow up.     Atypical MPN S/P URD allo, now at 18 months.   Day 30 marrow (6/6) hypercellular marrow, no evidence of malignancy, chimerism 2% recipient  Day 100 BMbx (09/15/15) Normochromic normocytic anemia. Mild lymphopenia. Mildly hypercellular bone marrow with trilineage hematopoiesis. Chimerism 2% recipient.  1 year marrow with normocellularity and 100% donor chimerism   Blood counts are excellent.     GVHD  Full dose MTX given day 1,3,6. Day 11 mtx held due to elevated bilirubin and mucositis  Tacro taper on HOLD  Tacro dose: will stop tacro when supply ended as she has just been on 0.5 mg every other day.   Has mild elevation in LFTs, dry mouth, these are stable and not worsening. Discussed hypervigilance during tacro taper and to contact BMT office if she notes any s/s of GVHD    Increased dry mouth with mucoceles- using dexamethasone oral compound solution intermittently; will pick up refill today   Increased SOB - PFTs were stable at 1 year and symptoms are stable.     ID  Hx Cholecystitis, pneumonia 05/2015  Immunocompromised  CMV PCR not required both negative   Will continue acyclovir for now.     If IgG <400 or clinical indication, will consider IVIG; she has not needed this.   IgG 769 on 11/9  Completing vaccines; had flu shot.     CV/Pulm  Hx A-flutter  Cont Metoprolol 50 mg BID  Followed by Dr. MKathi Der Cardiology  Off cardizem, stopped by Dr. MKathi Der Has oxygen at home, but has not needed it   Possible OSA, needs sleep study at  some point soon    Acute KI on CKD r/t tacrolimus- resolving  Creatinine 0.94  Encouraged PO intake    Magnesium 1.3, oral magnesium supplement:4 tabs/day    GI  Cont protonix  Zofran prn   LFTs continue to be elevated, unclear if this is GVHD0  RUQ [place] minimally heterogenous echotexture the liver w/o definite lesion. Punctuate echogenic foci are nonspecific and may represent granulomas. No gallstones or biliary dilatation.       MSK  Pain behind L knee -  LLE dopp ordered- 2/21 NEG  Pain from elbow to wrist; with tingling of right middle fingers  Pain in bilateral knees with shooting sensation when attempting to stand-and bilateral hip pain; plain films show degenerative changes; she has not had MRIs to r/o AVN and she has had steroid exposure in past for sciatic type pain. Will obtain MRI of hips and refer to orthopedics as this has been very limiting for her.     Smoking cessation   Has been able to abstain thus far    Follow up  BMT clinic: IN 6 weeks    Bloodwork frequency: labs prior    BMT [name]  Referring [name]  Lives in [place]    Cheron Schaumann, MD

## 2016-12-13 ENCOUNTER — Other Ambulatory Visit: Payer: Self-pay | Admitting: Oncology

## 2016-12-13 ENCOUNTER — Ambulatory Visit
Admission: RE | Admit: 2016-12-13 | Discharge: 2016-12-13 | Disposition: A | Discharge status: Home / Self Care | Payer: No Typology Code available for payment source | Source: Ambulatory Visit | Attending: Radiology | Admitting: Radiology

## 2016-12-13 ENCOUNTER — Ambulatory Visit
Admission: RE | Admit: 2016-12-13 | Discharge: 2016-12-13 | Disposition: A | Discharge status: Home / Self Care | Payer: No Typology Code available for payment source | Source: Ambulatory Visit

## 2016-12-13 ENCOUNTER — Telehealth: Payer: Self-pay | Admitting: Hematology

## 2016-12-13 ENCOUNTER — Ambulatory Visit
Admission: RE | Admit: 2016-12-13 | Discharge: 2016-12-13 | Disposition: A | Discharge status: Home / Self Care | Payer: No Typology Code available for payment source | Source: Ambulatory Visit | Attending: Hematology | Admitting: Hematology

## 2016-12-13 DIAGNOSIS — S8991XA Unspecified injury of right lower leg, initial encounter: Secondary | ICD-10-CM

## 2016-12-13 DIAGNOSIS — R52 Pain, unspecified: Secondary | ICD-10-CM

## 2016-12-13 DIAGNOSIS — M25551 Pain in right hip: Secondary | ICD-10-CM | POA: Insufficient documentation

## 2016-12-13 DIAGNOSIS — M1712 Unilateral primary osteoarthritis, left knee: Secondary | ICD-10-CM | POA: Insufficient documentation

## 2016-12-13 DIAGNOSIS — Z9481 Bone marrow transplant status: Secondary | ICD-10-CM

## 2016-12-13 DIAGNOSIS — M87059 Idiopathic aseptic necrosis of unspecified femur: Secondary | ICD-10-CM | POA: Insufficient documentation

## 2016-12-13 DIAGNOSIS — M25552 Pain in left hip: Secondary | ICD-10-CM

## 2016-12-13 DIAGNOSIS — M160 Bilateral primary osteoarthritis of hip: Secondary | ICD-10-CM | POA: Insufficient documentation

## 2016-12-13 NOTE — Telephone Encounter (Signed)
Spoke to [employer]. I pointed out they have all the orders the called about. They now realize this and are all set.

## 2016-12-14 ENCOUNTER — Ambulatory Visit
Admission: RE | Admit: 2016-12-14 | Discharge: 2016-12-14 | Disposition: A | Discharge status: Home / Self Care | Payer: No Typology Code available for payment source | Source: Ambulatory Visit

## 2016-12-14 ENCOUNTER — Ambulatory Visit
Admission: RE | Admit: 2016-12-14 | Discharge: 2016-12-14 | Disposition: A | Discharge status: Home / Self Care | Payer: No Typology Code available for payment source | Source: Ambulatory Visit | Attending: Hematology | Admitting: Hematology

## 2016-12-14 DIAGNOSIS — M25552 Pain in left hip: Secondary | ICD-10-CM

## 2016-12-14 DIAGNOSIS — Z9484 Stem cells transplant status: Secondary | ICD-10-CM

## 2016-12-14 DIAGNOSIS — M87059 Idiopathic aseptic necrosis of unspecified femur: Secondary | ICD-10-CM

## 2016-12-14 DIAGNOSIS — M25551 Pain in right hip: Secondary | ICD-10-CM

## 2016-12-14 MED ORDER — GADOBUTROL 1 MMOL/ML (GADAVIST) IV SOLN *WRAPPED*
12.0000 mL | Freq: Once | INTRAVENOUS | Status: AC
Start: 2016-12-14 — End: 2016-12-14
  Administered 2016-12-14: 12 mL via INTRAVENOUS

## 2016-12-27 ENCOUNTER — Other Ambulatory Visit
Admission: RE | Admit: 2016-12-27 | Discharge: 2016-12-27 | Disposition: A | Discharge status: Home / Self Care | Payer: No Typology Code available for payment source | Source: Ambulatory Visit | Attending: Oncology | Admitting: Oncology

## 2016-12-27 ENCOUNTER — Ambulatory Visit: Payer: No Typology Code available for payment source | Attending: Hematology | Admitting: Hematology

## 2016-12-27 VITALS — BP 127/65 | HR 83 | Temp 96.3°F | Resp 18 | Wt 256.2 lb

## 2016-12-27 DIAGNOSIS — Z9481 Bone marrow transplant status: Secondary | ICD-10-CM

## 2016-12-27 DIAGNOSIS — D471 Chronic myeloproliferative disease: Secondary | ICD-10-CM | POA: Insufficient documentation

## 2016-12-27 DIAGNOSIS — C9200 Acute myeloblastic leukemia, not having achieved remission: Secondary | ICD-10-CM

## 2016-12-27 LAB — COMPREHENSIVE METABOLIC PANEL
ALT: 45 U/L — ABNORMAL HIGH (ref 0–35)
AST: 28 U/L (ref 0–35)
Albumin: 4.1 g/dL (ref 3.5–5.2)
Alk Phos: 108 U/L — ABNORMAL HIGH (ref 35–105)
Anion Gap: 13 (ref 7–16)
Bilirubin,Total: 0.3 mg/dL (ref 0.0–1.2)
CO2: 25 mmol/L (ref 20–28)
Calcium: 9.4 mg/dL (ref 8.6–10.2)
Chloride: 103 mmol/L (ref 96–108)
Creatinine: 0.83 mg/dL (ref 0.51–0.95)
GFR,Black: 91 *
GFR,Caucasian: 79 *
Glucose: 111 mg/dL — ABNORMAL HIGH (ref 60–99)
Lab: 22 mg/dL — ABNORMAL HIGH (ref 6–20)
Potassium: 4.3 mmol/L (ref 3.3–5.1)
Sodium: 141 mmol/L (ref 133–145)
Total Protein: 6.5 g/dL (ref 6.3–7.7)

## 2016-12-27 LAB — CBC AND DIFFERENTIAL
Baso # K/uL: 0 10*3/uL (ref 0.0–0.1)
Basophil %: 0.6 %
Eos # K/uL: 0.5 10*3/uL — ABNORMAL HIGH (ref 0.0–0.4)
Eosinophil %: 6.6 %
Hematocrit: 40 % (ref 34–45)
Hemoglobin: 12.8 g/dL (ref 11.2–15.7)
IMM Granulocytes #: 0.1 10*3/uL (ref 0.0–0.1)
IMM Granulocytes: 0.7 %
Lymph # K/uL: 1.8 10*3/uL (ref 1.2–3.7)
Lymphocyte %: 25.5 %
MCH: 31 pg/cell (ref 26–32)
MCHC: 32 g/dL (ref 32–36)
MCV: 95 fL (ref 79–95)
Mono # K/uL: 0.6 10*3/uL (ref 0.2–0.9)
Monocyte %: 8.3 %
Neut # K/uL: 4.1 10*3/uL (ref 1.6–6.1)
Nucl RBC # K/uL: 0 10*3/uL (ref 0.0–0.0)
Nucl RBC %: 0 /100 WBC (ref 0.0–0.2)
Platelets: 234 10*3/uL (ref 160–370)
RBC: 4.2 MIL/uL (ref 3.9–5.2)
RDW: 14 % (ref 11.7–14.4)
Seg Neut %: 58.3 %
WBC: 7.1 10*3/uL (ref 4.0–10.0)

## 2016-12-27 LAB — LACTATE DEHYDROGENASE: LD: 171 U/L (ref 118–225)

## 2016-12-27 LAB — MAGNESIUM: Magnesium: 1.8 mEq/L (ref 1.3–2.1)

## 2016-12-27 LAB — NEUTROPHIL #-INSTRUMENT: Neutrophil #-Instrument: 4.1 10*3/uL

## 2016-12-27 NOTE — Progress Notes (Signed)
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  She hopes to exercise while she is there.  She had two brief steroid courses for sciatic notch pain/buttock pain on the right.  This helped somewhat, but she gained weight during that time and wants to take off those pounds.  She has not restarted smoking.   --MRI of hips did not show AVN, but they did show significant arthritis changes.  She has been using a lot of ibuprofen for the pain.   -Knee is better.   -feels dry mouth is improved since using decadron s/s,  - Breathing is stable; doing well.   -She has been evaluated by orthopedics for median nerve compression; wears splints.    -No other signs of GVHD noted; she is now off tacrolimus.     Other pertinent findings checked/documented below  _0  fevers  _1  palpitations _2  urinary frequency   _3  chills _4  chest pain _5  dysuria    _6  dry mouth _7  nausea/emesis  _8  rash   _9  ear pain _10  abdominal cramping _11  skin changes   _12  dry mouth _13  abdominal discomfort _14  pain   _15  sore throat _16  loose stool, non-formed _17  fatigue   _18  cough  _19  diarrhea, watery _20  enlarged lymph nodes   _21  sputum production  _22  weight loss _23  Joint pains   _24  shortness of breath _25  insufficient oral intake _26  none of the above     Medications  Reviewed medications with patient today & electronically reconciled  Current Outpatient Prescriptions on File Prior to Visit   Medication Sig Dispense Refill    acyclovir (ZOVIRAX) 400 MG tablet Take 1 tablet (400 mg total) by mouth 2 times daily 180 tablet 2    metoprolol (LOPRESSOR) 50 MG tablet TAKE 1 TABLET (50 MG TOTAL) BY MOUTH 2 TIMES DAILY 60 tablet 4    traMADol (ULTRAM) 50 MG tablet Take 1  tablet (50 mg total) by mouth every 6 hours as needed for Pain   Max daily dose: 200 mg 60 tablet 0    pantoprazole (PROTONIX) 40 MG EC tablet TAKE 1 TABLET (40 MG TOTAL) BY MOUTH 2 TIMES DAILY SWALLOW WHOLE. DO NOT CRUSH, BREAK, OR CHEW. 60 tablet 3    escitalopram (LEXAPRO) 20 MG tablet Take 20 mg by mouth daily  5    aspirin 81 MG EC tablet Take 81 mg by mouth daily      biotin 5 MG tablet Take 5 mg by mouth daily      ondansetron (ZOFRAN) 4 MG tablet Take 1 tablet (4 mg total) by mouth 3 times daily as needed (nausea) 60 tablet 3    prochlorperazine (COMPAZINE) 10 MG tablet Take 1 tablet (10 mg total) by mouth 4 times daily as needed for Nausea 40 tablet 6     No current facility-administered medications on file prior to visit.      VS, weight, KPS  BP 127/65 (BP Location: Left arm, Patient Position: Sitting, Cuff Size: large adult)    Pulse 83    Temp 35.7 C (96.3 F) (Temporal)    Resp 18    Wt 116.2 kg (256  lb 2.8 oz)    SpO2 97%    BMI 41.37 kg/m     Wt Readings from Last 3 Encounters:   12/27/16 116.2 kg (256 lb 2.8 oz)   12/14/16 116.5 kg (256 lb 13.4 oz)   12/13/16 113.9 kg (251 lb)       KPS%: 80    Physical Exam   General Appearance:  Mental status  Sits leaning to left to protect right buttock.   Normal affect, speech, dress, motor activity   Head:  Normocephalic, atraumatic   Eyes:  Conjunctivae/corneas clear, no eye drainage   Oral/Throat: Lips, mucosa, and tongue dry,   no postpharyngeal exudate   Neck: Supple, normal turgor; no adenopathy.    Heart: S1, S2 normal, regular rhythm, no murmur, rub or gallop   Lungs:   Respirations unlabored, clear to A&P auscultation bilaterally   Abdomen:   Soft, non-tender, bowel sounds active all four quadrants, tympanic, no masses, no organomegaly   Extremities: No edema; no obvious right knee edema; no tenderness over sciatic notch.    Pulses: Not checked.    Skin: No rashes or lesions     Recent Results (from the past 24 hour(s))   CBC and differential     Collection Time: 12/27/16 12:53 PM   Result Value Ref Range    WBC 7.1 4.0 - 10.0 THOU/uL    RBC 4.2 3.9 - 5.2 MIL/uL    Hemoglobin 12.8 11.2 - 15.7 g/dL    Hematocrit 40 34 - 45 %    MCV 95 79 - 95 fL    MCH 31 26 - 32 pg/cell    MCHC 32 32 - 36 g/dL    RDW 14.0 11.7 - 14.4 %    Platelets 234 160 - 370 THOU/uL    Seg Neut % 58.3 %    Lymphocyte % 25.5 %    Monocyte % 8.3 %    Eosinophil % 6.6 %    Basophil % 0.6 %    Neut # K/uL 4.1 1.6 - 6.1 THOU/uL    Lymph # K/uL 1.8 1.2 - 3.7 THOU/uL    Mono # K/uL 0.6 0.2 - 0.9 THOU/uL    Eos # K/uL 0.5 (H) 0.0 - 0.4 THOU/uL    Baso # K/uL 0.0 0.0 - 0.1 THOU/uL    Nucl RBC % 0.0 0.0 - 0.2 /100 WBC    Nucl RBC # K/uL 0.0 0.0 - 0.0 THOU/uL    IMM Granulocytes # 0.1 0.0 - 0.1 THOU/uL    IMM Granulocytes 0.7 %   Neutrophil #-Instrument    Collection Time: 12/27/16 12:53 PM   Result Value Ref Range    Neutrophil #-Instrument 4.1 THOU/uL     Chemistries are still pending.     Assessment and Plan  Carly Higgins is a 56 yo female with unclassifiable myeloproliferative neoplasm and completed Decitabine 8m/m2 IV daily x 5 days - Cycle # 1 12/15/14 and had been on Hydrea prior to transplant. 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     The patient came to clinic today for routine follow up. She is now 19 months from transplant.     Atypical MPN S/P URD allo, now at 18 months.   Day 30 marrow (6/6) hypercellular marrow, no evidence of malignancy, chimerism 2% recipient  Day 100 BMbx (09/15/15) Normochromic normocytic anemia. Mild lymphopenia. Mildly hypercellular bone  marrow with trilineage hematopoiesis. Chimerism 2% recipient.  1 year marrow with normocellularity and 100% donor chimerism   Blood counts are excellent.     GVHD  Full dose MTX given day 1,3,6. Day 11 mtx held due to elevated bilirubin and mucositis    Has mild elevation in LFTs, dry mouth, these are stable and  not worsening. Discussed hypervigilance during tacro taper and to contact BMT office if she notes any s/s of GVHD; Will f/u on LFTs from today.     Increased dry mouth with mucoceles- using dexamethasone oral compound solution intermittently; will pick up refill today   Increased SOB - PFTs were stable at 1 year and symptoms are stable.     ID  Hx Cholecystitis, pneumonia 05/2015  Immunocompromised  CMV PCR not required both negative   Will continue acyclovir for now.     If IgG <400 or clinical indication, will consider IVIG; she has not needed this.     Completing vaccines; had flu shot; will check on shingrix vaccines. Marland Kitchen     CV/Pulm  Hx A-flutter  Cont Metoprolol 50 mg BID  Followed by Dr. Kathi Der, Cardiology  Off cardizem, stopped by Dr. Kathi Der  Possible OSA, needs sleep study at some point soon    Acute KI on CKD r/t tacrolimus- resolving  Creatinine pending  Encouraged PO intake      GI  Cont protonix; especially as she is using NSAIDs.   Zofran prn   LFTs continue to be elevated, unclear if this is GVHD\  RUQ [place]; minimally heterogenous echotexture the liver w/o definite lesion. Punctuate echogenic foci are nonspecific and may represent granulomas. No gallstones or biliary dilatation.       MSK  Pain from elbow to wrist; with tingling of right middle fingers; has wrist splint to use.   Pain in bilateral knees with shooting sensation when attempting to stand-and bilateral hip pain; No AVN on hip films; still has pain over right sciatic notch.     Smoking cessation   Has been able to abstain thus far    Follow up  BMT clinic:On return from [facility]; vaccines to resume in February.   Bloodwork frequency: labs prior    Referring [name]  Lives in [place]    Cheron Schaumann, MD

## 2017-01-10 ENCOUNTER — Encounter: Payer: Self-pay | Admitting: Hematology

## 2017-01-10 NOTE — Telephone Encounter (Signed)
Error

## 2017-01-21 ENCOUNTER — Telehealth: Payer: Self-pay | Admitting: Hematology

## 2017-01-21 NOTE — Telephone Encounter (Signed)
Routed to the BMT scheduler.

## 2017-01-23 ENCOUNTER — Telehealth: Payer: Self-pay | Admitting: Hematology

## 2017-01-23 NOTE — Telephone Encounter (Signed)
I called Carly Higgins, returning her call, in regards to coming in sooner than her March appointment, as she is back in town.  I left her a message to call back.

## 2017-01-25 ENCOUNTER — Telehealth: Payer: Self-pay | Admitting: Hematology

## 2017-02-01 NOTE — Progress Notes (Signed)
BMT SOCIAL WORK NOTE    Contacts:  Marco Adelson [phone]    Ms. Duignan left a [employer] for SW requesting a call back.  During our telephone conversation she had questions about SSD.  She has apparently moved and was wondering about the impact of her SSD, and Life Insurance through [employer].  SW educated her about the process for ongoing evaluation of her eligibility for SSD.  SSA typically reviews disability cases every 18-24 months.  Providing the individual continues to meet criteria for disability based on a survey and feedback from the medical providers the benefit could continue.    Ms. Balla appeared to understand the information, and was satisfied with the information that was shared.  She does not have any other questions or concerns at this time.  Social work will continue to follow providing psychosocial support, and Journalist, newspaper.  Should any needs arise prior to her next visit please notify social work so that I may provide assistance at that time.       [facility] SOCIAL WORK:   [facility] Social Work Therapist, nutritional:     Social Work contact made?: Yes      Method of contact:  Telephone    Risk Factors:  Barriers to Care and Financial    Barriers to care:  Insurance    Social Work post assessment plan:  As needed follow-up  Referral:     Time spent on this encounter (in minutes):  [place], [place]

## 2017-02-05 ENCOUNTER — Telehealth: Payer: Self-pay | Admitting: Hematology

## 2017-02-05 NOTE — Telephone Encounter (Signed)
Called Carly Higgins and rescheduled her appointments for Dr Lytle Butte and vaccines from March to February, as requested.

## 2017-02-21 ENCOUNTER — Encounter: Payer: Self-pay | Admitting: Hematology

## 2017-02-21 ENCOUNTER — Ambulatory Visit: Payer: No Typology Code available for payment source | Attending: Hematology | Admitting: Sports Medicine

## 2017-02-21 ENCOUNTER — Encounter: Payer: Self-pay | Admitting: Sports Medicine

## 2017-02-21 ENCOUNTER — Ambulatory Visit: Payer: No Typology Code available for payment source

## 2017-02-21 ENCOUNTER — Ambulatory Visit: Payer: No Typology Code available for payment source | Attending: Hematology | Admitting: Hematology

## 2017-02-21 ENCOUNTER — Other Ambulatory Visit
Admission: RE | Admit: 2017-02-21 | Discharge: 2017-02-21 | Disposition: A | Discharge status: Home / Self Care | Payer: No Typology Code available for payment source | Source: Ambulatory Visit | Attending: Oncology | Admitting: Oncology

## 2017-02-21 VITALS — BP 151/63 | HR 64 | Temp 98.6°F | Resp 18 | Ht 65.98 in | Wt 264.4 lb

## 2017-02-21 VITALS — BP 134/80 | Ht 65.98 in | Wt 264.0 lb

## 2017-02-21 DIAGNOSIS — M1610 Unilateral primary osteoarthritis, unspecified hip: Secondary | ICD-10-CM

## 2017-02-21 DIAGNOSIS — D471 Chronic myeloproliferative disease: Secondary | ICD-10-CM

## 2017-02-21 DIAGNOSIS — M7061 Trochanteric bursitis, right hip: Secondary | ICD-10-CM

## 2017-02-21 DIAGNOSIS — D849 Immunodeficiency, unspecified: Secondary | ICD-10-CM

## 2017-02-21 DIAGNOSIS — Z9481 Bone marrow transplant status: Secondary | ICD-10-CM

## 2017-02-21 DIAGNOSIS — Z23 Encounter for immunization: Secondary | ICD-10-CM | POA: Insufficient documentation

## 2017-02-21 LAB — CBC AND DIFFERENTIAL
Baso # K/uL: 0.1 10*3/uL (ref 0.0–0.1)
Basophil %: 0.7 %
Eos # K/uL: 0.6 10*3/uL — ABNORMAL HIGH (ref 0.0–0.4)
Eosinophil %: 8.2 %
Hematocrit: 41 % (ref 34–45)
Hemoglobin: 12.9 g/dL (ref 11.2–15.7)
IMM Granulocytes #: 0 10*3/uL
IMM Granulocytes: 0.6 %
Lymph # K/uL: 1.7 10*3/uL (ref 1.2–3.7)
Lymphocyte %: 24.6 %
MCH: 30 pg/cell (ref 26–32)
MCHC: 32 g/dL (ref 32–36)
MCV: 95 fL (ref 79–95)
Mono # K/uL: 0.6 10*3/uL (ref 0.2–0.9)
Monocyte %: 8.8 %
Neut # K/uL: 3.9 10*3/uL (ref 1.6–6.1)
Nucl RBC # K/uL: 0 10*3/uL (ref 0.0–0.0)
Nucl RBC %: 0 /100 WBC (ref 0.0–0.2)
Platelets: 256 10*3/uL (ref 160–370)
RBC: 4.3 MIL/uL (ref 3.9–5.2)
RDW: 13.8 % (ref 11.7–14.4)
Seg Neut %: 57.1 %
WBC: 6.8 10*3/uL (ref 4.0–10.0)

## 2017-02-21 LAB — COMPREHENSIVE METABOLIC PANEL
ALT: 38 U/L — ABNORMAL HIGH (ref 0–35)
AST: 31 U/L (ref 0–35)
Albumin: 4.4 g/dL (ref 3.5–5.2)
Alk Phos: 96 U/L (ref 35–105)
Anion Gap: 10 (ref 7–16)
Bilirubin,Total: 0.5 mg/dL (ref 0.0–1.2)
CO2: 31 mmol/L — ABNORMAL HIGH (ref 20–28)
Calcium: 10 mg/dL (ref 8.6–10.2)
Chloride: 100 mmol/L (ref 96–108)
Creatinine: 0.9 mg/dL (ref 0.51–0.95)
GFR,Black: 82 *
GFR,Caucasian: 71 *
Glucose: 96 mg/dL (ref 60–99)
Lab: 20 mg/dL (ref 6–20)
Potassium: 5.3 mmol/L — ABNORMAL HIGH (ref 3.3–5.1)
Sodium: 141 mmol/L (ref 133–145)
Total Protein: 7 g/dL (ref 6.3–7.7)

## 2017-02-21 LAB — NEUTROPHIL #-INSTRUMENT: Neutrophil #-Instrument: 3.9 10*3/uL

## 2017-02-21 LAB — LACTATE DEHYDROGENASE: LD: 197 U/L (ref 118–225)

## 2017-02-21 LAB — MAGNESIUM: Magnesium: 2.2 mg/dL (ref 1.6–2.5)

## 2017-02-21 MED ORDER — MELOXICAM 15 MG PO TABS *I*
15.0000 mg | ORAL_TABLET | Freq: Every day | ORAL | 1 refills | Status: DC
Start: 2017-02-21 — End: 2017-03-14

## 2017-02-21 MED ORDER — ZOSTER VAC RECOMB ADJUVANTED 50 MCG IM SUSR *I*
50.0000 ug | Freq: Once | INTRAMUSCULAR | Status: AC
Start: 2017-02-21 — End: 2017-02-21
  Administered 2017-02-21: 50 ug via INTRAMUSCULAR
  Filled 2017-02-21: qty 0.5

## 2017-02-21 MED ORDER — HEPATITIS B VAC RECOMBINANT 20 MCG/ML IJ VIAL/SYR *WRAPPED*
20.0000 ug | Freq: Once | INTRAMUSCULAR | Status: AC
Start: 2017-02-21 — End: 2017-02-21
  Administered 2017-02-21: 20 ug via INTRAMUSCULAR
  Filled 2017-02-21: qty 1

## 2017-02-21 MED ORDER — PNEUMOCOCCAL VAC POLYVALENT 25 MCG/0.5ML INJ *WRAPPED*
0.5000 mL | INJECTION | Freq: Once | INTRAMUSCULAR | Status: AC
Start: 2017-02-21 — End: 2017-02-21
  Administered 2017-02-21: 0.5 mL via INTRAMUSCULAR
  Filled 2017-02-21: qty 0.5

## 2017-02-21 NOTE — Progress Notes (Signed)
Patient scheduled for BMT vaccines, received shingrex, pneumo23, and energix for prophylaxis. Tolerated in both upper arms well, AVS given, ambulated independently from [facility].

## 2017-02-21 NOTE — Progress Notes (Signed)
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  She will be seeing orthopedics this afternoon as her right sciatic notch pain has reached the point of being unbearable.  She has trouble sitting or sleepgin.   She had two brief steroid courses for sciatic notch pain/buttock pain, and this time, they did not seem to help.  She also gets some numbness in her right leg and foot.    --MRI of hips did not show AVN, but they did show significant arthritis changes.  She has been using a lot of ibuprofen for the pain. She had taken 4 this morning .  -Knee is better.   -feels dry mouth is improved since using decadron s/s,  - Breathing is stable; doing well.   -She has been evaluated by orthopedics for median nerve compression; wears splints.   No GI, GU, or other symptoms to suggest GVHD activity.   She has a cataract and has seen Dr. Merlene Morse.     Other pertinent findings checked/documented below  []  fevers  []  palpitations []  urinary frequency   []  chills []  chest pain []  dysuria    [x]  dry mouth []  nausea/emesis  []  rash   []  ear pain []  abdominal cramping []  skin changes   []  dry mouth []  abdominal discomfort []  pain   []  sore throat []  loose stool, non-formed []  fatigue   []  cough  []  diarrhea, watery []  enlarged lymph nodes   []  sputum production  []  weight loss []  Joint pains   [x]  shortness of breath []  insufficient oral intake []  none of the above     Medications  Reviewed medications with patient today & electronically reconciled  Current Outpatient Prescriptions on File Prior to Visit   Medication Sig Dispense Refill    acyclovir (ZOVIRAX) 400 MG tablet Take 1 tablet (400 mg total) by mouth 2 times daily 180 tablet 2    ondansetron  (ZOFRAN) 4 MG tablet Take 1 tablet (4 mg total) by mouth 3 times daily as needed (nausea) 60 tablet 3    metoprolol (LOPRESSOR) 50 MG tablet TAKE 1 TABLET (50 MG TOTAL) BY MOUTH 2 TIMES DAILY 60 tablet 4    traMADol (ULTRAM) 50 MG tablet Take 1 tablet (50 mg total) by mouth every 6 hours as needed for Pain   Max daily dose: 200 mg 60 tablet 0    pantoprazole (PROTONIX) 40 MG EC tablet TAKE 1 TABLET (40 MG TOTAL) BY MOUTH 2 TIMES DAILY SWALLOW WHOLE. DO NOT CRUSH, BREAK, OR CHEW. 60 tablet 3    escitalopram (LEXAPRO) 20 MG tablet Take 20 mg by mouth daily  5    aspirin 81 MG EC tablet Take 81 mg by mouth daily      biotin 5 MG tablet Take 5 mg by mouth daily      prochlorperazine (COMPAZINE) 10 MG tablet Take 1 tablet (10 mg total) by mouth 4 times daily as needed for Nausea 40 tablet 6     No current facility-administered medications on file prior to visit.      VS, weight, KPS  BP 151/63    Pulse 64    Temp 37 C (98.6  F) (Temporal)    Resp 18    Ht 167.6 cm (5' 5.98")    Wt 120 kg (264 lb 7.1 oz)    SpO2 97%    BMI 42.70 kg/m     Wt Readings from Last 3 Encounters:   02/21/17 119.7 kg (264 lb)   02/21/17 120 kg (264 lb 7.1 oz)   12/27/16 116.2 kg (256 lb 2.8 oz)       KPS%: 80    Physical Exam   General Appearance:  Mental status  Sits leaning to left to protect right buttock.   Normal affect, speech, dress, motor activity   Head:  Normocephalic, atraumatic   Eyes:  Conjunctivae/corneas clear, no eye drainage   Oral/Throat: Lips, mucosa, and tongue dry,   no postpharyngeal exudate   Neck: Supple, normal turgor; no adenopathy.    Heart: S1, S2 normal, regular rhythm, no murmur, rub or gallop   Lungs:   Respirations unlabored, clear to A&P auscultation bilaterally   Abdomen:   Soft, non-tender, bowel sounds active all four quadrants, tympanic, no masses, no organomegaly   Extremities: No edema; no obvious right knee edema; no tenderness over sciatic notch.Motor strength appears intact.     Pulses: Not  checked.    Skin: No rashes or lesions     Recent Results (from the past 24 hour(s))   CBC and differential    Collection Time: 02/21/17 10:25 AM   Result Value Ref Range    WBC 6.8 4.0 - 10.0 THOU/uL    RBC 4.3 3.9 - 5.2 MIL/uL    Hemoglobin 12.9 11.2 - 15.7 g/dL    Hematocrit 41 34 - 45 %    MCV 95 79 - 95 fL    MCH 30 26 - 32 pg/cell    MCHC 32 32 - 36 g/dL    RDW 13.8 11.7 - 14.4 %    Platelets 256 160 - 370 THOU/uL    Seg Neut % 57.1 %    Lymphocyte % 24.6 %    Monocyte % 8.8 %    Eosinophil % 8.2 %    Basophil % 0.7 %    Neut # K/uL 3.9 1.6 - 6.1 THOU/uL    Lymph # K/uL 1.7 1.2 - 3.7 THOU/uL    Mono # K/uL 0.6 0.2 - 0.9 THOU/uL    Eos # K/uL 0.6 (H) 0.0 - 0.4 THOU/uL    Baso # K/uL 0.1 0.0 - 0.1 THOU/uL    Nucl RBC % 0.0 0.0 - 0.2 /100 WBC    Nucl RBC # K/uL 0.0 0.0 - 0.0 THOU/uL    IMM Granulocytes # 0.0 THOU/uL    IMM Granulocytes 0.6 %   Comprehensive metabolic panel    Collection Time: 02/21/17 10:25 AM   Result Value Ref Range    Sodium 141 133 - 145 mmol/L    Potassium 5.3 (H) 3.3 - 5.1 mmol/L    Chloride 100 96 - 108 mmol/L    CO2 31 (H) 20 - 28 mmol/L    Anion Gap 10 7 - 16    UN 20 6 - 20 mg/dL    Creatinine 0.90 0.51 - 0.95 mg/dL    GFR,Caucasian 71 *    GFR,Black 82 *    Glucose 96 60 - 99 mg/dL    Calcium 10.0 8.6 - 10.2 mg/dL    Total Protein 7.0 6.3 - 7.7 g/dL    Albumin 4.4 3.5 - 5.2 g/dL  Bilirubin,Total 0.5 0.0 - 1.2 mg/dL    AST 31 0 - 35 U/L    ALT 38 (H) 0 - 35 U/L    Alk Phos 96 35 - 105 U/L   Lactate dehydrogenase    Collection Time: 02/21/17 10:25 AM   Result Value Ref Range    LD 197 118 - 225 U/L   Magnesium    Collection Time: 02/21/17 10:25 AM   Result Value Ref Range    Magnesium 2.2 1.6 - 2.5 mg/dL   Neutrophil #-Instrument    Collection Time: 02/21/17 10:25 AM   Result Value Ref Range    Neutrophil #-Instrument 3.9 THOU/uL       Assessment and Plan  NATAUSHA JUNGWIRTH is a 57 yo female with unclassifiable myeloproliferative neoplasm and completed Decitabine 53m/m2 IV daily x 5 days  - Cycle # 1 12/15/14 and had been on Hydrea prior to transplant. 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     The patient came to clinic today for routine follow up. She is now 21 months from transplant.     Atypical MPN S/P URD allo, now at 21 months.   Day 30 marrow (6/6) hypercellular marrow, no evidence of malignancy, chimerism 2% recipient  Day 100 BMbx (09/15/15) Normochromic normocytic anemia. Mild lymphopenia. Mildly hypercellular bone marrow with trilineage hematopoiesis. Chimerism 2% recipient.  1 year marrow with normocellularity and 100% donor chimerism   Blood counts are excellent.     GVHD  Full dose MTX given day 1,3,6. Day 11 mtx held due to elevated bilirubin and mucositis    Has mild elevation in LFTs, dry mouth, these are stable and not worsening. Discussed hypervigilance during tacro taper and to contact BMT office if she notes any s/s of GVHD; Will f/u on LFTs from today.     Increased dry mouth with mucoceles- using dexamethasone oral compound solution intermittently; will pick up refill today   Increased SOB - PFTs were stable at 1 year and symptoms are stable.     ID  Hx Cholecystitis, pneumonia 05/2015  Immunocompromised  CMV PCR not required both negative   Will continue acyclovir for now.     If IgG <400 or clinical indication, will consider IVIG; she has not needed this.     Completing vaccines; had flu shot; will check on shingrix vaccines. .Marland Kitchen    CV/Pulm  Hx A-flutter  Cont Metoprolol 50 mg BID  Followed by Dr. MKathi Der Cardiology  Off cardizem, stopped by Dr. MKathi Der Possible OSA, needs sleep study at some point soon    Acute KI on CKD r/t tacrolimus- resolving  Creatinine pending  Encouraged PO intake      GI  Cont protonix; especially as she is using NSAIDs.   Zofran prn   LFTs are better.   RUQ [place] minimally heterogenous echotexture the liver w/o  definite lesion. Punctuate echogenic foci are nonspecific and may represent granulomas. No gallstones or biliary dilatation.       MSK  Pain from elbow to wrist; with tingling of right middle fingers; has wrist splint to use.   Pain in bilateral knees with shooting sensation when attempting to stand-and bilateral hip pain; No AVN on hip films; still has pain over right sciatic notch. This has become severe and disabling to her.  She will see orthopedics today.  I am not sure if she will benefit  from a steroid injection or if she requires imaging of low back/pelvis.  No actual back pain.  She has tramadol and has been using a large amount of NSAIDs.     Smoking cessation   Has been able to abstain thus far    Follow up  BMT clinic:1 month  Bloodwork frequency: labs prior    Referring [name]  Lives in [place]    Cheron Schaumann, MD

## 2017-02-21 NOTE — Progress Notes (Addendum)
C.C.: Presents for right buttocks and hip pain.    HPI: Patient states symptoms started  several months ago.  denies injury or trauma. She has pain deep in the right glut that is exacerbated by sitting and relieved by lying down. She had this issue years ago and it resolved with oral steroid treatment which has not worked this time. The pain came back in September and has steadily worsened, it originally started after a long car drive to [place] when it first occurred years ago. She gets minimal relief with ibuprofen, and is taking over 10 daily. She is also having paresthesias in the right foot that occasionally rise the posterior calf when the pain comes on.     Pain is located deep in the gluteus muscle inferior and lateral to SI joint, superior to piriformis origin, and much superior to hamstring origins at ischium.    Symptoms are located deep in the right gluts, does not radiate.       Symptoms are Persistent, and are worsening since onset.  Pain is described as aching, similar to prior episodes, throbbing and uncomfortable  Aggravated by pressure on injured area and sitting, alleviated with lying down, standing and walking.  denies swelling, redness, warmth, bruising.      Patient has tried:  []  icing  []  elevation   [x]  rest  []  heat  [x]  anti-inflammatories:Advil  [x]  Tylenol  []  injection:  []  PT  []  Brace  []  Chiropractic  []  Massage therapy  []  Acupuncture  []  Other: ___    Patient has not previously been seen for this. Consult requested by PCP.     Patient's medications, allergies, and surgical histories: Per patient questionnaire, reviewed and confirmed.  Past medical history: Per patient questionnaire, reviewed and confirmed.  Details include myeloproliferative disorder and s/p bone marrow transplant.  Social history: Per patient questionnaire, reviewed and confirmed.   reports that she quit smoking about 21 months ago. She has a 15.00 pack-year smoking history. She has never used smokeless  tobacco..  Family history: Per patient questionnaire, reviewed and confirmed.    Evaluation to date: I personally reviewed patient's MRI which shows bilateral hip OA as well as fluid likely showing inflammation at the greater trochanter.  A radiologist report is available for review.     ROS:  Review of systems:  Per patient questionnaire.  Details include: depression and foot numbness.  Denies fever, chills, rash, night sweats, unintentional weight loss.  All other systems negative.           OBJECTIVE:   Vital Signs:    Vitals:    02/21/17 1623   BP: 134/80   Weight: 119.7 kg (264 lb)   Height: 1.676 m (5' 5.98")       General: WDWN, in moderate discomfort and alert, oriented Caucasian  female. Tearful, frustrated  Mental Status:  Alert and oriented x3, pleasant and cooperative with exam.  Extremities: No swelling, erythema, ecchymosis or edema.  Gait: antalgic    Musculoskeletal:   R Hip: hip ROM full but with groin pain with internal and external rotation. Some tenderness at greater trochanter on right. 5/5 strength. Paresthesias in right foot to ankle without specific peripheral or radicular distribution. Nontender to palpation at ischium and hamstring attachments and SI joint. Full back flexion and extension without reproduction of symptoms, straight leg raise on right reproduces gluteal pain without radicular symptoms, bowstring test does the same. No lower back tenderness to palpation.  ASSESSMENT / PLAN:  Impression:   57 y.o. female with gluteal pain and wide differential. Patient definitely has right hip OA by imaging and exam, though provocative testing produces a different pain in the groin than the self described debilitating pain she is here for today. Top of differential is myofascial gluteal pain so will send patient to PT. Piriformis syndrome is also a possibility with her foot paresthesias, though would expect antagonizing sciatic nerve to create symptoms traveling down the leg and not two  separate areas of foot and buttocks. SI joint is non tender but is not viewed on current MRI and provocative testing is negative on exam. There is some greater trochanter tenderness but again is not nearly the level or location of the pain she is trying to get treated here today.     Plan:   - Greater trochanter CSI today under US guidance, see procedure note- will do diagnostic injections with conservative therapy as patient is in much distress today and symptoms do not fit any of multiple of potential pain generators in this area  - PT referral for gluteal strengthening  - Meloxicam prescription  - Follow up in 2-3 weeks    Questions solicited and answered.     Precautions reviewed.  Medication changes, refills reviewed including directions, side effects and monitoring.    Teofilo Pod  PGY-3 PM&R  02/21/2017  5:49 PM    Attestation:    I saw and evaluated the patient. I have reviewed and edited the resident's/fellow's note and confirm the findings and plan of care as documented above.  I was present for procedure.    Edman Circle, MD, [place]  Sports Medicine  Attending Physician

## 2017-03-05 ENCOUNTER — Other Ambulatory Visit: Payer: Self-pay | Admitting: Hematology

## 2017-03-05 DIAGNOSIS — Z9481 Bone marrow transplant status: Secondary | ICD-10-CM

## 2017-03-07 ENCOUNTER — Ambulatory Visit: Payer: No Typology Code available for payment source | Admitting: Sports Medicine

## 2017-03-11 NOTE — Progress Notes (Signed)
BMT SOCIAL WORK NOTE    Contact:  [facility] [phone] (fax)    SW received disability papers from [employer] re: Ms. Klasen.  The patient's chart was reviewed and the forms were provided to [name] for review and signature.  After the signature is received the forms will be faxed to [employer], and a copy of the form will be entered into the patient's medical record.    [facility] SOCIAL WORK:   [facility] Social Work Therapist, nutritional:     Risk Factors:  Barriers to Care and Financial    Barriers to care:  Insurance    Social Work post assessment plan:  As needed follow-up  Referral:     Time spent on this encounter (in minutes):  [place], [place]  BMT/Leukemia Social Worker  [phone]

## 2017-03-12 ENCOUNTER — Ambulatory Visit: Payer: No Typology Code available for payment source | Admitting: Sports Medicine

## 2017-03-14 ENCOUNTER — Ambulatory Visit: Payer: No Typology Code available for payment source | Attending: Sports Medicine | Admitting: Sports Medicine

## 2017-03-14 ENCOUNTER — Ambulatory Visit: Payer: No Typology Code available for payment source

## 2017-03-14 ENCOUNTER — Encounter: Payer: Self-pay | Admitting: Sports Medicine

## 2017-03-14 ENCOUNTER — Ambulatory Visit: Payer: No Typology Code available for payment source | Admitting: Hematology

## 2017-03-14 VITALS — BP 148/96 | Ht 66.0 in | Wt 264.5 lb

## 2017-03-14 DIAGNOSIS — M1610 Unilateral primary osteoarthritis, unspecified hip: Secondary | ICD-10-CM

## 2017-03-14 DIAGNOSIS — M7061 Trochanteric bursitis, right hip: Secondary | ICD-10-CM

## 2017-03-14 MED ORDER — BETAMETHASONE ACET & SOD PHOS 6 (3-3) MG/ML IJ SUSP *I*
12.0000 mg | Freq: Once | INTRAMUSCULAR | Status: AC | PRN
Start: 2017-03-14 — End: 2017-03-14
  Administered 2017-03-14: 12 mg via INTRA_ARTICULAR

## 2017-03-14 MED ORDER — LIDOCAINE HCL 1 % IJ SOLN *I*
5.0000 mL | Freq: Once | INTRAMUSCULAR | Status: AC | PRN
Start: 2017-02-21 — End: 2017-02-21
  Administered 2017-02-21: 5 mL via INTRA_ARTICULAR

## 2017-03-14 MED ORDER — LIDOCAINE HCL 2 % IJ SOLN *I*
1.0000 mL | Freq: Once | INTRAMUSCULAR | Status: AC | PRN
Start: 2017-03-14 — End: 2017-03-14
  Administered 2017-03-14: 1 mL via INTRA_ARTICULAR

## 2017-03-14 MED ORDER — LIDOCAINE HCL 1 % IJ SOLN *I*
1.0000 mL | Freq: Once | INTRAMUSCULAR | Status: AC | PRN
Start: 2017-02-21 — End: 2017-02-21
  Administered 2017-02-21: 1 mL via INTRA_ARTICULAR

## 2017-03-14 MED ORDER — LIDOCAINE HCL 2 % IJ SOLN *I*
5.0000 mL | Freq: Once | INTRAMUSCULAR | Status: AC | PRN
Start: 2017-03-14 — End: 2017-03-14
  Administered 2017-03-14: 5 mL via INTRA_ARTICULAR

## 2017-03-14 MED ORDER — LIDOCAINE HCL 2 % IJ SOLN *I*
2.0000 mL | Freq: Once | INTRAMUSCULAR | Status: AC | PRN
Start: 2017-03-14 — End: 2017-03-14
  Administered 2017-03-14: 2 mL via INTRA_ARTICULAR

## 2017-03-14 MED ORDER — LIDOCAINE HCL 1 % IJ SOLN *I*
2.0000 mL | Freq: Once | INTRAMUSCULAR | Status: AC | PRN
Start: 2017-02-21 — End: 2017-02-21
  Administered 2017-02-21: 2 mL via INTRA_ARTICULAR

## 2017-03-14 MED ORDER — BETAMETHASONE ACET & SOD PHOS 6 (3-3) MG/ML IJ SUSP *I*
12.0000 mg | Freq: Once | INTRAMUSCULAR | Status: AC | PRN
Start: 2017-02-21 — End: 2017-02-21
  Administered 2017-02-21: 12 mg via INTRA_ARTICULAR

## 2017-03-14 NOTE — Procedures (Signed)
Large Joint Aspiration/Injection Procedure    Date/Time: 03/14/2017 4:13 PM  Consent given by: patient  Site marked: site marked  Timeout: Immediately prior to procedure a time out was called to verify the correct patient, procedure, equipment, support staff and site/side marked as required     Procedure Details    Location: hip - R hip joint  Preparation: The site was prepped using the usual aseptic technique.  Ultrasound guidance:  Ultrasound was utilized to improve needle visualization, injection accuracy, and anatomic localization.    Anesthetics administered: 5 mL lidocaine 2 %; 1 mL lidocaine 2 %; 2 mL lidocaine 2 %  Intra-Articular Steroids administered: 12 mg betamethasone acetate & sodium phosphate 6 (3-3) MG/ML  Dressing:  A dry, sterile dressing was applied.  Patient tolerance: patient tolerated the procedure well with no immediate complications

## 2017-03-14 NOTE — Procedures (Signed)
Large Joint Aspiration/Injection Procedure    Date/Time: 02/21/2017 3:58 PM  Consent given by: patient  Site marked: site marked  Timeout: Immediately prior to procedure a time out was called to verify the correct patient, procedure, equipment, support staff and site/side marked as required     Procedure Details    Location: hip - R greater trochanteric bursa  Preparation: The site was prepped using the usual aseptic technique.  Ultrasound guidance:  Ultrasound was utilized to improve needle visualization, injection accuracy, and anatomic localization.    Anesthetics administered: 5 mL lidocaine hcl 1 %; 2 mL lidocaine hcl 1 %; 1 mL lidocaine hcl 1 %  Intra-Articular Steroids administered: 12 mg betamethasone acetate & sodium phosphate 6 (3-3) MG/ML  Dressing:  A dry, sterile dressing was applied.  Patient tolerance: patient tolerated the procedure well with no immediate complications

## 2017-03-14 NOTE — Progress Notes (Signed)
HISTORY OF PRESENT ILLNESS    Carly Higgins is a 57 y.o. female who returns today for followup care of right hip pain    Patient's medications, allergies, past medical, surgical, social and family histories were reviewed and updated as appropriate.   Patient   reports that she quit smoking about 22 months ago. She has a 15.00 pack-year smoking history. She has never used smokeless tobacco.    Review of systems:  As above, otherwise negative or noncontributory.    OBJECTIVE    Vital Signs:    Vitals:    03/14/17 1527   BP: (!) 148/96   Weight: 120 kg (264 lb 8 oz)   Height: 1.676 m (5\' 6" )     General: NAD  Pain with hip rotation    ASSESSMENT / PLAN:  1. Trochanteric bursitis of right hip     2. Hip arthritis       She is not feeling any better after lateral hip inj last time.  She maybe had transient relief for 2-3 days  USG intraarticular hip inj today.  May consider piriformis vs SI inj next time if not improving  Weight loss and exercise strongly recommended    Precautions reviewed.  Medication changes, refills reviewed including directions, side effects and monitoring.

## 2017-03-26 ENCOUNTER — Telehealth: Payer: Self-pay | Admitting: Hematology

## 2017-03-26 NOTE — Telephone Encounter (Signed)
Routed to BMT Social Worker, Chilton [place].

## 2017-03-27 ENCOUNTER — Telehealth: Payer: Self-pay | Admitting: Hematology

## 2017-03-27 NOTE — Telephone Encounter (Signed)
Called Carly Higgins and advised her I have already routed her previous call and will route this call to Carly Higgins MSW. I am not aware of the paperwork she is calling about and there is nothing in media in her chart. Carly Higgins usually manages the disability paperwork for our patients.  Carly Higgins is satisfied with this plan.

## 2017-05-02 ENCOUNTER — Encounter: Payer: Self-pay | Admitting: Sports Medicine

## 2017-05-02 ENCOUNTER — Ambulatory Visit: Payer: No Typology Code available for payment source | Attending: Sports Medicine | Admitting: Sports Medicine

## 2017-05-02 VITALS — BP 110/64 | Ht 66.0 in | Wt 271.6 lb

## 2017-05-02 DIAGNOSIS — M1610 Unilateral primary osteoarthritis, unspecified hip: Secondary | ICD-10-CM

## 2017-05-02 DIAGNOSIS — E669 Obesity, unspecified: Secondary | ICD-10-CM

## 2017-05-02 DIAGNOSIS — R4589 Other symptoms and signs involving emotional state: Secondary | ICD-10-CM

## 2017-05-02 DIAGNOSIS — F329 Major depressive disorder, single episode, unspecified: Secondary | ICD-10-CM

## 2017-05-03 ENCOUNTER — Other Ambulatory Visit
Admission: RE | Admit: 2017-05-03 | Discharge: 2017-05-03 | Disposition: A | Discharge status: Home / Self Care | Payer: No Typology Code available for payment source | Source: Ambulatory Visit | Attending: Internal Medicine | Admitting: Internal Medicine

## 2017-05-03 DIAGNOSIS — Z0001 Encounter for general adult medical examination with abnormal findings: Secondary | ICD-10-CM | POA: Insufficient documentation

## 2017-05-03 LAB — LIPID PANEL
Chol/HDL Ratio: 3.2
Cholesterol: 180 mg/dL
HDL: 57 mg/dL
LDL Calculated: 106 mg/dL
Non HDL Cholesterol: 123 mg/dL
Triglycerides: 83 mg/dL

## 2017-05-03 LAB — TSH: TSH: 5.21 u[IU]/mL — ABNORMAL HIGH (ref 0.27–4.20)

## 2017-05-06 LAB — VITAMIN D
25-OH VIT D2: <4 ng/mL
25-OH VIT D3: 27 ng/mL
25-OH Vit Total: 27 ng/mL — ABNORMAL LOW (ref 30–60)

## 2017-05-08 ENCOUNTER — Other Ambulatory Visit: Payer: Self-pay | Admitting: Gastroenterology

## 2017-05-08 ENCOUNTER — Other Ambulatory Visit
Admission: RE | Admit: 2017-05-08 | Discharge: 2017-05-08 | Disposition: A | Discharge status: Home / Self Care | Payer: No Typology Code available for payment source | Source: Ambulatory Visit | Attending: Anatomic Pathology & Clinical Pathology | Admitting: Anatomic Pathology & Clinical Pathology

## 2017-05-08 DIAGNOSIS — Z1211 Encounter for screening for malignant neoplasm of colon: Secondary | ICD-10-CM | POA: Insufficient documentation

## 2017-05-08 NOTE — Procedures (Signed)
[facility]    Colonoscopy    Date of Procedure: 05/08/2017   Primary Physician: Benedict Needy, MD        Attending Physician: Carmelina Noun, M.D.  Patient Name: Carly Higgins      Indications: Colorectal cancer screening-average risk    Previous colonoscopy: No    Medications:Fentanyl 100 mcg IV and Midazolam 5 mg IV were administered incrementally over the course of the procedure to achieve an adequate level of conscious sedation.    Pre-Procedure Physical:  Patient's medications, allergies, past medical, surgical, social and family histories were reviewed and updated as appropriate.    Well appearing Caucasian female   Airway:  normal  Heart:  normal S1 and S2  Lungs:  clear  Abdomen:  soft, nontender, normal bowel sounds  Mental Status:  awake and alert; oriented to person, place, and time     ASA Class: 2    Informed Consent: Prior to the procedure, the patient was explained the risk, benefits and alternatives including the risk of bleeding, infection or perforation and informed consent was obtained.     Procedure Details: The patient was placed in the left lateral decubitus position and monitored continuously with ECG tracing, pulse oximetry, blood pressure monitoring and direct observations. After anorectal examination was performed, the Olympus CF-H180was inserted into the rectum and advanced under direct vision to the cecum, which was identified by  the ileocecal valve and the appendiceal orifice. The procedure was considered more difficult than average. Additional maneuvers used: applied abdominal pressure.    Narrative:  During withdrawal examination, the final quality of the prep was [facility] Bowel Prep Scale   Prep:    Right Colon: Grade3- (entire mucosa of colon segment seen well, with no residual staining, small fragments of stool, or opaque liguid)    Transverse Colon: Grade 3- (entire mucosa of colon segment seen well, with no residual staining, small  fragments of stool, or opaque liguid)    Left Colon: Grade 3- (entire mucosa of colon segment seen well, with no residual staining, small fragments of stool, or opaque liguid)    A careful inspection was made as the colonoscope was withdrawn, a retroflexed view of the rectum was included; findings and interventions are described below. Appropriate photo documentation was obtained. The patient  recovered in the [place] recovery unit.    Findings:     Terminal Ileum: Not seen    Colon: In the sigmoid colon a 12mm polyp was removed with biopsy forceps. There is mild, angulated sigmoid diverticulosis.      Complications: The patient tolerated the procedure well.    Impression: Small polyp; mild diverticulosis    Recommendations: 1. Await pathology  2. If hyperplastic polyp, repeat colonoscopy in 10 years.  3. If adenomatous polyp, repeat colonoscopy in 5 years.    Electronically signed: Koren Shiver, MD    Note created: 05/08/2017  Cell: [phone] Office Phone # [phone]

## 2017-05-09 ENCOUNTER — Encounter: Payer: Self-pay | Admitting: Hematology

## 2017-05-09 ENCOUNTER — Ambulatory Visit: Payer: No Typology Code available for payment source | Attending: Hematology | Admitting: Hematology

## 2017-05-09 ENCOUNTER — Other Ambulatory Visit
Admission: RE | Admit: 2017-05-09 | Discharge: 2017-05-09 | Disposition: A | Discharge status: Home / Self Care | Payer: No Typology Code available for payment source | Source: Ambulatory Visit | Attending: Oncology | Admitting: Oncology

## 2017-05-09 VITALS — BP 144/78 | HR 88 | Temp 97.2°F | Resp 17 | Ht 65.98 in | Wt 267.4 lb

## 2017-05-09 DIAGNOSIS — D471 Chronic myeloproliferative disease: Secondary | ICD-10-CM

## 2017-05-09 DIAGNOSIS — Z9481 Bone marrow transplant status: Secondary | ICD-10-CM | POA: Insufficient documentation

## 2017-05-09 LAB — COMPREHENSIVE METABOLIC PANEL
ALT: 35 U/L (ref 0–35)
AST: 29 U/L (ref 0–35)
Albumin: 4.2 g/dL (ref 3.5–5.2)
Alk Phos: 101 U/L (ref 35–105)
Anion Gap: 13 (ref 7–16)
Bilirubin,Total: 0.4 mg/dL (ref 0.0–1.2)
CO2: 26 mmol/L (ref 20–28)
Calcium: 9.6 mg/dL (ref 8.6–10.2)
Chloride: 102 mmol/L (ref 96–108)
Creatinine: 0.83 mg/dL (ref 0.51–0.95)
GFR,Black: 91 *
GFR,Caucasian: 79 *
Glucose: 105 mg/dL — ABNORMAL HIGH (ref 60–99)
Lab: 18 mg/dL (ref 6–20)
Potassium: 4.3 mmol/L (ref 3.3–5.1)
Sodium: 141 mmol/L (ref 133–145)
Total Protein: 6.9 g/dL (ref 6.3–7.7)

## 2017-05-09 LAB — NEUTROPHIL #-INSTRUMENT: Neutrophil #-Instrument: 4.6 10*3/uL

## 2017-05-09 LAB — CBC AND DIFFERENTIAL
Baso # K/uL: 0.1 10*3/uL (ref 0.0–0.1)
Basophil %: 0.7 %
Eos # K/uL: 0.4 10*3/uL (ref 0.0–0.4)
Eosinophil %: 5.6 %
Hematocrit: 38 % (ref 34–45)
Hemoglobin: 12.5 g/dL (ref 11.2–15.7)
IMM Granulocytes #: 0.1 10*3/uL
IMM Granulocytes: 0.7 %
Lymph # K/uL: 1.6 10*3/uL (ref 1.2–3.7)
Lymphocyte %: 21.9 %
MCH: 31 pg/cell (ref 26–32)
MCHC: 33 g/dL (ref 32–36)
MCV: 94 fL (ref 79–95)
Mono # K/uL: 0.6 10*3/uL (ref 0.2–0.9)
Monocyte %: 8 %
Neut # K/uL: 4.6 10*3/uL (ref 1.6–6.1)
Nucl RBC # K/uL: 0 10*3/uL (ref 0.0–0.0)
Nucl RBC %: 0 /100 WBC (ref 0.0–0.2)
Platelets: 218 10*3/uL (ref 160–370)
RBC: 4.1 MIL/uL (ref 3.9–5.2)
RDW: 13.2 % (ref 11.7–14.4)
Seg Neut %: 63.1 %
WBC: 7.3 10*3/uL (ref 4.0–10.0)

## 2017-05-09 LAB — MAGNESIUM: Magnesium: 2.2 mg/dL (ref 1.6–2.5)

## 2017-05-09 LAB — LACTATE DEHYDROGENASE: LD: 188 U/L (ref 118–225)

## 2017-05-10 LAB — SURGICAL PATHOLOGY

## 2017-05-10 NOTE — Progress Notes (Signed)
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 Overall she admits that she is quite depressed with weight gain and joint pain. She notes that she is going to start physical therapy and seek out a counselor. Otherwise she denies any fevers, chills, chest pain, or abdominal pain. She is having regular BMs. No changes in vision, mouth pain/sores or new rashes. She does have intermittent nausea.     Other pertinent findings checked/documented below  []  fevers  []  palpitations []  urinary frequency   []  chills []  chest pain []  dysuria    []  dry mouth [x]  nausea/emesis  []  rash   []  ear pain []  abdominal cramping []  skin changes   []  dry mouth []  abdominal discomfort []  pain   []  sore throat []  loose stool, non-formed [x]  fatigue   []  cough  []  diarrhea, watery []  enlarged lymph nodes   []  sputum production  []  weight loss [x]  Joint pains   []  shortness of breath []  insufficient oral intake []  none of the above     Medications  Reviewed medications with patient today & electronically reconciled  Current Outpatient Prescriptions on File Prior to Visit   Medication Sig Dispense Refill    metoprolol (LOPRESSOR) 50 MG tablet TAKE 1 TABLET (50 MG TOTAL) BY MOUTH 2 TIMES DAILY 180 tablet 0    acyclovir (ZOVIRAX) 400 MG tablet Take 1 tablet (400 mg total) by mouth 2 times daily 180 tablet 2    ondansetron (ZOFRAN) 4 MG tablet Take 1 tablet (4 mg total) by mouth 3 times daily as needed (nausea) 60 tablet 3    traMADol (ULTRAM) 50 MG tablet Take 1 tablet (50 mg total) by mouth every 6 hours as needed for Pain   Max daily dose: 200 mg 60 tablet 0    pantoprazole (PROTONIX) 40 MG EC tablet TAKE 1 TABLET (40 MG TOTAL) BY MOUTH 2 TIMES DAILY SWALLOW WHOLE. DO NOT  CRUSH, BREAK, OR CHEW. (Patient taking differently: daily) 60 tablet 3    escitalopram (LEXAPRO) 20 MG tablet Take 20 mg by mouth daily  5    aspirin 81 MG EC tablet Take 81 mg by mouth daily      biotin 5 MG tablet Take 5 mg by mouth daily      prochlorperazine (COMPAZINE) 10 MG tablet Take 1 tablet (10 mg total) by mouth 4 times daily as needed for Nausea 40 tablet 6     No current facility-administered medications on file prior to visit.      VS, weight, KPS  BP 144/78    Pulse 88    Temp 36.2 C (97.2 F) (Temporal)    Resp 17    Ht 167.6 cm (5' 5.98")    Wt 121.3 kg (267 lb 6.7 oz)    SpO2 96%    BMI 43.18 kg/m     Wt Readings from Last 3 Encounters:   05/09/17 121.3 kg (267 lb 6.7 oz)   05/02/17 123.2 kg (271 lb 9 oz)   03/14/17 120 kg (264 lb 8 oz)       KPS%: 80    Physical Exam   General Appearance:  Mental status  NAD  Normal  affect, speech, dress, motor activity   Head:  Normocephalic, atraumatic   Eyes:  Conjunctivae/corneas clear, no eye drainage   Oral/Throat: MMM   Neck: Supple, normal turgor; no adenopathy.    Heart: S1, S2 normal, regular rhythm, no murmur, rub or gallop   Lungs:   Respirations unlabored, clear to A&P auscultation bilaterally   Abdomen:   Soft, non-tender, bowel sounds active all four quadrants, tympanic, no masses, no organomegaly   Extremities: No edema   Pulses: Not checked.    Skin: No rashes or lesions     Assessment and Plan  Carly Higgins is a 57 yo female with unclassifiable myeloproliferative neoplasm and completed Decitabine 66m/m2 IV daily x 5 days - Cycle # 1 12/15/14 and had been on Hydrea prior to transplant. 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 She is now 23 months from transplant.     Atypical MPN S/P URD allo, now at 23 months.   Day 30 marrow (6/6) hypercellular marrow, no evidence of malignancy,  chimerism 2% recipient  Day 100 BMbx (09/15/15) Normochromic normocytic anemia. Mild lymphopenia. Mildly hypercellular bone marrow with trilineage hematopoiesis. Chimerism 2% recipient.  1 year marrow with normocellularity and 100% donor chimerism   Blood counts remain stable without any signs of relapse    GVHD  Full dose MTX given day 1,3,6. Day 11 mtx held due to elevated bilirubin and mucositis  Off tacrolimus  Prior elevation in LFTs have resolved. Dry mouth improved.   Shortness of breath stable    ID  Hx Cholecystitis, pneumonia 05/2015  Immunocompromised  CMV PCR not required both negative   Will continue acyclovir for now.   If IgG <400 or clinical indication, will consider IVIG; she has not needed this.   Completing vaccines; return in august for next vaccinations    CV/Pulm  Hx A-flutter  Cont Metoprolol 50 mg BID  Followed by Dr. MKathi Der Cardiology  Off cardizem, stopped by Dr. MKathi Der Possible OSA, Obtaining home sleep study    Acute KI on CKD r/t tacrolimus- resolving  Creatinine stable  Encouraged PO intake    GI  Cont protonix; especially as she is using NSAIDs.   Zofran prn   LFTs improved and stable  RUQ [place] minimally heterogenous echotexture the liver w/o definite lesion. Punctuate echogenic foci are nonspecific and may represent granulomas. No gallstones or biliary dilatation.     MSK  Pain from elbow to wrist; with tingling of right middle fingers; has wrist splint to use.   Pain in bilateral knees with shooting sensation when attempting to stand-and bilateral hip pain; No AVN on hip films; still has pain over right sciatic notch. This has become severe and disabling to her.  Follows with orthopedics and has obtained injections  She will be starting PT    Smoking cessation   Has been able to abstain thus far    Follow up  BMT clinic: 3 months  Bloodwork frequency: labs prior    Referring Dr. CLenn Cal [place]

## 2017-05-26 NOTE — Progress Notes (Addendum)
HISTORY OF PRESENT ILLNESS    Carly Higgins is a 57 y.o. female who returns today for followup care of right hip pain.  She is still having pain.      Patient's medications, allergies, past medical, surgical, social and family histories were reviewed and updated as appropriate.   Patient   reports that she quit smoking about 2 years ago. She has a 15.00 pack-year smoking history. She has never used smokeless tobacco.    Review of systems:  As above, otherwise negative or noncontributory.    OBJECTIVE    Vital Signs:    Vitals:    05/02/17 0834   BP: 110/64   Weight: 123.2 kg (271 lb 9 oz)   Height: 1.676 m (5\' 6" )     General: NAD  Hip :  Pain with hip rotation still  Gait:  Mildly antalgic    ASSESSMENT / PLAN:  1. Hip arthritis     2. Depressed mood     3. Obesity       She is not feeling any better after intraarticular hip inj last time.  She maybe had transient relief for 2-3 days so certainly some of this pain could be from hip joint  She may wish to consider cymbalta with PCP instead of Lexapro as this may help pain more  Weight loss and exercise strongly recommended - not a surgical candidate due to BMI.  When BMI is less than 40, I would recommend consultation with surgeon for possible arthroplasty.    Precautions reviewed.  Medication changes, refills reviewed including directions, side effects and monitoring.

## 2017-06-21 ENCOUNTER — Other Ambulatory Visit
Admission: RE | Admit: 2017-06-21 | Discharge: 2017-06-21 | Disposition: A | Discharge status: Home / Self Care | Payer: No Typology Code available for payment source | Source: Ambulatory Visit | Attending: Internal Medicine | Admitting: Internal Medicine

## 2017-06-21 DIAGNOSIS — Z0189 Encounter for other specified special examinations: Secondary | ICD-10-CM | POA: Insufficient documentation

## 2017-06-21 LAB — TSH: TSH: 4.55 u[IU]/mL — ABNORMAL HIGH (ref 0.27–4.20)

## 2017-06-25 ENCOUNTER — Encounter: Payer: Self-pay | Admitting: Gastroenterology

## 2017-07-23 ENCOUNTER — Other Ambulatory Visit: Payer: Self-pay | Admitting: Hematology

## 2017-07-23 ENCOUNTER — Other Ambulatory Visit
Admission: RE | Admit: 2017-07-23 | Discharge: 2017-07-23 | Disposition: A | Discharge status: Home / Self Care | Payer: No Typology Code available for payment source | Source: Ambulatory Visit | Attending: Internal Medicine | Admitting: Internal Medicine

## 2017-07-23 DIAGNOSIS — K219 Gastro-esophageal reflux disease without esophagitis: Secondary | ICD-10-CM

## 2017-07-23 DIAGNOSIS — E039 Hypothyroidism, unspecified: Secondary | ICD-10-CM | POA: Insufficient documentation

## 2017-07-23 DIAGNOSIS — Z9481 Bone marrow transplant status: Secondary | ICD-10-CM

## 2017-07-23 LAB — TSH: TSH: 2.29 u[IU]/mL (ref 0.27–4.20)

## 2017-08-05 ENCOUNTER — Telehealth: Payer: Self-pay | Admitting: Hematology

## 2017-08-05 NOTE — Telephone Encounter (Signed)
Called patient and rescheduled her appointment, per her request.   Changed from 8/1 to 8/8, with Dr Lytle Butte and vaccines to follow

## 2017-08-08 ENCOUNTER — Ambulatory Visit: Payer: No Typology Code available for payment source | Admitting: Hematology

## 2017-08-08 ENCOUNTER — Ambulatory Visit: Payer: No Typology Code available for payment source

## 2017-08-15 ENCOUNTER — Ambulatory Visit: Payer: No Typology Code available for payment source

## 2017-08-15 ENCOUNTER — Ambulatory Visit: Payer: No Typology Code available for payment source | Attending: Hematology | Admitting: Hematology

## 2017-08-15 ENCOUNTER — Encounter: Payer: Self-pay | Admitting: Hematology

## 2017-08-15 ENCOUNTER — Other Ambulatory Visit
Admission: RE | Admit: 2017-08-15 | Discharge: 2017-08-15 | Disposition: A | Discharge status: Home / Self Care | Payer: No Typology Code available for payment source | Source: Ambulatory Visit | Attending: Oncology | Admitting: Oncology

## 2017-08-15 VITALS — BP 113/83 | HR 71 | Temp 97.0°F | Resp 16 | Ht 65.98 in | Wt 265.3 lb

## 2017-08-15 DIAGNOSIS — Z9481 Bone marrow transplant status: Secondary | ICD-10-CM | POA: Insufficient documentation

## 2017-08-15 DIAGNOSIS — D849 Immunodeficiency, unspecified: Secondary | ICD-10-CM | POA: Insufficient documentation

## 2017-08-15 DIAGNOSIS — D471 Chronic myeloproliferative disease: Secondary | ICD-10-CM | POA: Insufficient documentation

## 2017-08-15 DIAGNOSIS — Z23 Encounter for immunization: Secondary | ICD-10-CM | POA: Insufficient documentation

## 2017-08-15 LAB — COMPREHENSIVE METABOLIC PANEL
ALT: 41 U/L — ABNORMAL HIGH (ref 0–35)
AST: 29 U/L (ref 0–35)
Albumin: 4.1 g/dL (ref 3.5–5.2)
Alk Phos: 106 U/L — ABNORMAL HIGH (ref 35–105)
Anion Gap: 13 (ref 7–16)
Bilirubin,Total: 0.3 mg/dL (ref 0.0–1.2)
CO2: 25 mmol/L (ref 20–28)
Calcium: 9.3 mg/dL (ref 8.6–10.2)
Chloride: 103 mmol/L (ref 96–108)
Creatinine: 0.82 mg/dL (ref 0.51–0.95)
GFR,Black: 92 *
GFR,Caucasian: 80 *
Glucose: 150 mg/dL — ABNORMAL HIGH (ref 60–99)
Lab: 15 mg/dL (ref 6–20)
Potassium: 4.3 mmol/L (ref 3.3–5.1)
Sodium: 141 mmol/L (ref 133–145)
Total Protein: 6.8 g/dL (ref 6.3–7.7)

## 2017-08-15 LAB — MAGNESIUM: Magnesium: 2 mg/dL (ref 1.6–2.5)

## 2017-08-15 LAB — CBC AND DIFFERENTIAL
Baso # K/uL: 0 10*3/uL (ref 0.0–0.1)
Basophil %: 0.5 %
Eos # K/uL: 0.4 10*3/uL (ref 0.0–0.4)
Eosinophil %: 5.9 %
Hematocrit: 40 % (ref 34–45)
Hemoglobin: 12.4 g/dL (ref 11.2–15.7)
IMM Granulocytes #: 0.1 10*3/uL
IMM Granulocytes: 0.8 %
Lymph # K/uL: 1.7 10*3/uL (ref 1.2–3.7)
Lymphocyte %: 26.6 %
MCH: 29 pg/cell (ref 26–32)
MCHC: 31 g/dL — ABNORMAL LOW (ref 32–36)
MCV: 92 fL (ref 79–95)
Mono # K/uL: 0.5 10*3/uL (ref 0.2–0.9)
Monocyte %: 7.8 %
Neut # K/uL: 3.8 10*3/uL (ref 1.6–6.1)
Nucl RBC # K/uL: 0 10*3/uL (ref 0.0–0.0)
Nucl RBC %: 0 /100 WBC (ref 0.0–0.2)
Platelets: 189 10*3/uL (ref 160–370)
RBC: 4.3 MIL/uL (ref 3.9–5.2)
RDW: 13.9 % (ref 11.7–14.4)
Seg Neut %: 58.4 %
WBC: 6.4 10*3/uL (ref 4.0–10.0)

## 2017-08-15 LAB — LACTATE DEHYDROGENASE: LD: 180 U/L (ref 118–225)

## 2017-08-15 LAB — NEUTROPHIL #-INSTRUMENT: Neutrophil #-Instrument: 3.8 10*3/uL

## 2017-08-15 MED ORDER — HEPATITIS B VAC RECOMBINANT 20 MCG/ML IJ VIAL/SYR *WRAPPED*
20.0000 ug | Freq: Once | INTRAMUSCULAR | Status: AC
Start: 2017-08-15 — End: 2017-08-15
  Administered 2017-08-15: 20 ug via INTRAMUSCULAR
  Filled 2017-08-15: qty 1

## 2017-08-15 MED ORDER — ZOSTER VAC RECOMB ADJUVANTED 50 MCG IM SUSR *I*
50.0000 ug | Freq: Once | INTRAMUSCULAR | Status: AC
Start: 2017-08-15 — End: 2017-08-15
  Administered 2017-08-15: 50 ug via INTRAMUSCULAR
  Filled 2017-08-15: qty 0.5

## 2017-08-15 MED ORDER — CLOTRIMAZOLE 1 % EX CREA *I*
TOPICAL_CREAM | Freq: Two times a day (BID) | CUTANEOUS | 6 refills | Status: DC
Start: 2017-08-15 — End: 2019-11-05

## 2017-08-15 MED ORDER — PNEUMOCOCCAL VAC POLYVALENT 25 MCG/0.5ML INJ *WRAPPED*
0.5000 mL | INJECTION | Freq: Once | INTRAMUSCULAR | Status: AC
Start: 2017-08-15 — End: 2017-08-15
  Administered 2017-08-15: 0.5 mL via INTRAMUSCULAR
  Filled 2017-08-15: qty 0.5

## 2017-08-15 NOTE — Progress Notes (Signed)
Patient here for post BMT vaccines. Vaccines placed in both arms bilat. Tolerated well, sites benign. VIS forms given. Patient discharged home in stable condition.

## 2017-08-15 NOTE — Progress Notes (Addendum)
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 Overall she admits that she is quite depressed; this time related to having to put her dog down a couple weeks ago.  The dog was 13 and was like a child to her.She still has hip and back pain but soldiers through this.  Her diet has deteriorated due to the stress of her dog's demise.  Otherwise she denies any fevers, chills, chest pain, or abdominal pain. She is having regular BMs. No changes in vision, mouth pain/sores or new rashes. She does have intermittent nausea. She has also noted a new rash beneath the breasts.  She has been putting triamcinolone cream on it.    Other pertinent findings checked/documented below  _0  fevers  _1  palpitations _2  urinary frequency   _3  chills _4  chest pain _5  dysuria    _6  dry mouth _7  nausea/emesis  _8  rash   _9  ear pain _10  abdominal cramping _11  skin changes   _12  dry mouth _13  abdominal discomfort _14  pain   _15  sore throat _16  loose stool, non-formed _17  fatigue   _18  cough  _19  diarrhea, watery _20  enlarged lymph nodes   _21  sputum production  _22  weight loss _23  Joint pains   _24  shortness of breath _25  insufficient oral intake _26  none of the above     Medications  Reviewed medications with patient today & electronically reconciled  Current Outpatient Prescriptions on File Prior to Visit   Medication Sig Dispense Refill    pantoprazole (PROTONIX) 40 MG EC tablet TAKE 1 TABLET (40 MG TOTAL) BY MOUTH 2 TIMES DAILY SWALLOW WHOLE. DO NOT CRUSH, BREAK, OR CHEW. (Patient taking differently: TAKE 1 TABLET (40 MG TOTAL) BY MOUTH DAILY SWALLOW WHOLE. DO NOT CRUSH, BREAK, OR CHEW.) 60 tablet 3    metoprolol (LOPRESSOR) 50 MG tablet TAKE 1 TABLET (50 MG TOTAL) BY MOUTH 2 TIMES DAILY 180  tablet 0    acyclovir (ZOVIRAX) 400 MG tablet Take 1 tablet (400 mg total) by mouth 2 times daily 180 tablet 2    escitalopram (LEXAPRO) 20 MG tablet Take 20 mg by mouth daily  5    aspirin 81 MG EC tablet Take 81 mg by mouth daily      ondansetron (ZOFRAN) 4 MG tablet Take 1 tablet (4 mg total) by mouth 3 times daily as needed (nausea) 60 tablet 3    traMADol (ULTRAM) 50 MG tablet Take 1 tablet (50 mg total) by mouth every 6 hours as needed for Pain   Max daily dose: 200 mg 60 tablet 0    biotin 5 MG tablet Take 5 mg by mouth daily      prochlorperazine (COMPAZINE) 10 MG tablet Take 1 tablet (10 mg total) by mouth 4 times daily as needed for Nausea 40 tablet 6     No current facility-administered medications on file prior to visit.      VS, weight, KPS  BP 113/83 (BP Location: Right arm, Patient Position: Sitting, Cuff Size: adult)    Pulse 71    Temp 36.1 C (97 F) (Temporal)    Resp 16    Ht 167.6 cm (5' 5.98")    Wt 120.4 kg (265 lb 5.2  oz)    SpO2 95%    BMI 42.85 kg/m     Wt Readings from Last 3 Encounters:   08/15/17 120.4 kg (265 lb 5.2 oz)   05/09/17 121.3 kg (267 lb 6.7 oz)   05/02/17 123.2 kg (271 lb 9 oz)       KPS%: 80    Physical Exam   General Appearance:  Mental status  NAD  Tearful but well-groomed and conversive.    Head:  Normocephalic, atraumatic   Eyes:  Conjunctivae/corneas clear, no eye drainage   Oral/Throat: MMM   Neck: Supple, normal turgor; no adenopathy.    Heart: S1, S2 normal, regular rhythm, no murmur, rub or gallop   Lungs:   Respirations unlabored, clear to A&P auscultation bilaterally   Abdomen:   Soft, non-tender, bowel sounds active all four quadrants, tympanic, no masses, no organomegaly   Extremities: No edema   Pulses: Not checked.    Skin: Skin tanned.  Fungal appearing rash beneath both breasts with some satellite lesions; none in the bones.      Recent Results (from the past 24 hour(s))   CBC and differential    Collection Time: 08/15/17 10:40 AM   Result Value Ref  Range    WBC 6.4 4.0 - 10.0 THOU/uL    RBC 4.3 3.9 - 5.2 MIL/uL    Hemoglobin 12.4 11.2 - 15.7 g/dL    Hematocrit 40 34 - 45 %    MCV 92 79 - 95 fL    MCH 29 26 - 32 pg/cell    MCHC 31 (L) 32 - 36 g/dL    RDW 13.9 11.7 - 14.4 %    Platelets 189 160 - 370 THOU/uL    Seg Neut % 58.4 %    Lymphocyte % 26.6 %    Monocyte % 7.8 %    Eosinophil % 5.9 %    Basophil % 0.5 %    Neut # K/uL 3.8 1.6 - 6.1 THOU/uL    Lymph # K/uL 1.7 1.2 - 3.7 THOU/uL    Mono # K/uL 0.5 0.2 - 0.9 THOU/uL    Eos # K/uL 0.4 0.0 - 0.4 THOU/uL    Baso # K/uL 0.0 0.0 - 0.1 THOU/uL    Nucl RBC % 0.0 0.0 - 0.2 /100 WBC    Nucl RBC # K/uL 0.0 0.0 - 0.0 THOU/uL    IMM Granulocytes # 0.1 THOU/uL    IMM Granulocytes 0.8 %   Comprehensive metabolic panel    Collection Time: 08/15/17 10:40 AM   Result Value Ref Range    Sodium 141 133 - 145 mmol/L    Potassium 4.3 3.3 - 5.1 mmol/L    Chloride 103 96 - 108 mmol/L    CO2 25 20 - 28 mmol/L    Anion Gap 13 7 - 16    UN 15 6 - 20 mg/dL    Creatinine 0.82 0.51 - 0.95 mg/dL    GFR,Caucasian 80 *    GFR,Black 92 *    Glucose 150 (H) 60 - 99 mg/dL    Calcium 9.3 8.6 - 10.2 mg/dL    Total Protein 6.8 6.3 - 7.7 g/dL    Albumin 4.1 3.5 - 5.2 g/dL    Bilirubin,Total 0.3 0.0 - 1.2 mg/dL    AST 29 0 - 35 U/L    ALT 41 (H) 0 - 35 U/L    Alk Phos 106 (H) 35 - 105 U/L   Lactate dehydrogenase  Collection Time: 08/15/17 10:40 AM   Result Value Ref Range    LD 180 118 - 225 U/L   Magnesium    Collection Time: 08/15/17 10:40 AM   Result Value Ref Range    Magnesium 2.0 1.6 - 2.5 mg/dL   Neutrophil #-Instrument    Collection Time: 08/15/17 10:40 AM   Result Value Ref Range    Neutrophil #-Instrument 3.8 THOU/uL       Assessment and Plan  Carly Higgins is a 57 yo female with unclassifiable myeloproliferative neoplasm and completed Decitabine 75m/m2 IV daily x 5 days - Cycle # 1 12/15/14 and had been on Hydrea prior to transplant. She was conditioned with Bu/Flu followed by a 10/10 HLA matched URD (female) Allogeneic PBSCT on  05/17/15 (pt/donor: ABO: A+/A+, CMV: neg/neg).The patient came to clinic today for routine follow up. She is now 26 months from transplant.     Atypical MPN S/P URD allo, now at 23 months.   Day 30 marrow (6/6) hypercellular marrow, no evidence of malignancy, chimerism 2% recipient  Day 100 BMbx (09/15/15) Normochromic normocytic anemia. Mild lymphopenia. Mildly hypercellular bone marrow with trilineage hematopoiesis. Chimerism 2% recipient.  1 year marrow with normocellularity and 100% donor chimerism   Blood counts remain stable without any signs of relapse    GVHD  Full dose MTX given day 1,3,6. Day 11 mtx held due to elevated bilirubin and mucositis  Off tacrolimus  Prior elevation in LFTs have resolved. Dry mouth improved.   Shortness of breath stable    ID  Hx Cholecystitis, pneumonia 05/2015  CMV PCR not required both negative   Will continue acyclovir for now.    Completing vaccines;having a panel today with last of two shingrix vaccines.  She has MMR left.  Can have flu shot this autumn.    CV/Pulm  Hx A-flutter  Cont Metoprolol 50 mg BID  Followed by Dr. MKathi Der Cardiology  Off cardizem, stopped by Dr. MKathi Der Possible OSA,    Acute KI on CKD r/t tacrolimus- resolving  Creatinine stable  Encouraged PO intake    GI  Cont protonix; especially as she is using NSAIDs but not as much as in past. .   Zofran prn     MSK  Pain from elbow to wrist; with tingling of right middle fingers; has wrist splint to use.   Pain in bilateral knees with shooting sensation when attempting to stand-and bilateral hip pain; No AVN on hip films; still has pain over right sciatic notch. This has become severe and disabling to her.  Follows with orthopedics and has obtained injections  She will be starting PT    Smoking cessation   Has been able to abstain thus far    Depression--somewhat situational she will ask Dr. CLenn Calif she can increase effexor.     Derm--has intertriginous rash--will  prescribe lotrimin cream as this appears candidal.     Follow up  BMT clinic: 3 months  Bloodwork frequency: labs prior    Referring Dr. CLenn Cal Lives [place]

## 2017-08-15 NOTE — Progress Notes (Signed)
[facility] TREATMENT HAND-OFF TOOL:   SITUATION:   Scheduled treatment category for today:     Cancer treatment:  Immunization    Is this a new cancer treatment?: No      Patient seen by MD    Consent obtained:  Not needed    Labs complete:  Yes    Within parameters: Yes      Current patient status:  Scheduled treatment    OK to treat for scheduled treatment:  Yes

## 2017-09-11 ENCOUNTER — Other Ambulatory Visit: Payer: Self-pay | Admitting: Hematology

## 2017-09-11 DIAGNOSIS — Z9481 Bone marrow transplant status: Secondary | ICD-10-CM

## 2017-10-28 ENCOUNTER — Telehealth: Payer: Self-pay | Admitting: Hematology

## 2017-10-28 NOTE — Telephone Encounter (Signed)
Reassured pt that she has been reimmunized 3 times since BMT- part of DPT injection.  Pt expressed understanding.

## 2017-11-14 ENCOUNTER — Other Ambulatory Visit
Admission: RE | Admit: 2017-11-14 | Discharge: 2017-11-14 | Disposition: A | Discharge status: Home / Self Care | Payer: No Typology Code available for payment source | Source: Ambulatory Visit | Attending: Oncology | Admitting: Oncology

## 2017-11-14 ENCOUNTER — Ambulatory Visit: Payer: No Typology Code available for payment source | Attending: Hematology | Admitting: Hematology

## 2017-11-14 ENCOUNTER — Encounter: Payer: Self-pay | Admitting: Hematology

## 2017-11-14 VITALS — BP 160/72 | HR 69 | Temp 96.8°F | Resp 16 | Ht 65.98 in | Wt 277.8 lb

## 2017-11-14 DIAGNOSIS — D471 Chronic myeloproliferative disease: Secondary | ICD-10-CM | POA: Insufficient documentation

## 2017-11-14 DIAGNOSIS — C9310 Chronic myelomonocytic leukemia not having achieved remission: Secondary | ICD-10-CM | POA: Insufficient documentation

## 2017-11-14 DIAGNOSIS — Z9481 Bone marrow transplant status: Secondary | ICD-10-CM | POA: Insufficient documentation

## 2017-11-14 LAB — CBC AND DIFFERENTIAL
Baso # K/uL: 0 10*3/uL (ref 0.0–0.1)
Basophil %: 0.5 %
Eos # K/uL: 0.3 10*3/uL (ref 0.0–0.4)
Eosinophil %: 5.1 %
Hematocrit: 41 % (ref 34–45)
Hemoglobin: 12.8 g/dL (ref 11.2–15.7)
IMM Granulocytes #: 0 10*3/uL
IMM Granulocytes: 0.6 %
Lymph # K/uL: 1.9 10*3/uL (ref 1.2–3.7)
Lymphocyte %: 29.3 %
MCH: 29 pg/cell (ref 26–32)
MCHC: 32 g/dL (ref 32–36)
MCV: 93 fL (ref 79–95)
Mono # K/uL: 0.6 10*3/uL (ref 0.2–0.9)
Monocyte %: 8.9 %
Neut # K/uL: 3.7 10*3/uL (ref 1.6–6.1)
Nucl RBC # K/uL: 0 10*3/uL (ref 0.0–0.0)
Nucl RBC %: 0 /100 WBC (ref 0.0–0.2)
Platelets: 231 10*3/uL (ref 160–370)
RBC: 4.4 MIL/uL (ref 3.9–5.2)
RDW: 13.7 % (ref 11.7–14.4)
Seg Neut %: 55.6 %
WBC: 6.6 10*3/uL (ref 4.0–10.0)

## 2017-11-14 LAB — COMPREHENSIVE METABOLIC PANEL
ALT: 30 U/L (ref 0–35)
AST: 24 U/L (ref 0–35)
Albumin: 4.1 g/dL (ref 3.5–5.2)
Alk Phos: 99 U/L (ref 35–105)
Anion Gap: 11 (ref 7–16)
Bilirubin,Total: 0.3 mg/dL (ref 0.0–1.2)
CO2: 27 mmol/L (ref 20–28)
Calcium: 9.4 mg/dL (ref 8.6–10.2)
Chloride: 101 mmol/L (ref 96–108)
Creatinine: 0.81 mg/dL (ref 0.51–0.95)
GFR,Black: 93 *
GFR,Caucasian: 81 *
Glucose: 153 mg/dL — ABNORMAL HIGH (ref 60–99)
Lab: 19 mg/dL (ref 6–20)
Potassium: 4.6 mmol/L (ref 3.3–5.1)
Sodium: 139 mmol/L (ref 133–145)
Total Protein: 6.6 g/dL (ref 6.3–7.7)

## 2017-11-14 LAB — LACTATE DEHYDROGENASE: LD: 175 U/L (ref 118–225)

## 2017-11-14 LAB — MAGNESIUM: Magnesium: 2.1 mg/dL (ref 1.6–2.5)

## 2017-11-14 LAB — NEUTROPHIL #-INSTRUMENT: Neutrophil #-Instrument: 3.7 10*3/uL

## 2017-11-14 NOTE — Progress Notes (Signed)
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 Overall she admits that she is quite depressed; this time related to having to put her dog down last summer.  She is still very tearful when discussing this.  The dog was 13 and was like a child to her.She still has hip and back pain but overall this is much better.. She is worried about her weight. She does have almost a daily hunger spasm like nausea that then resolves when she eats. She is worried this contributes to the weight gain  She has no actual emesis.  No other burning epigastric pain. She also notes some diaphoresis when this happens.   Otherwise she denies any fevers, chills, chest pain, or abdominal pain. She is having regular BMs. No changes in vision, mouth pain/sores or new rashes. She is hoping to go to [facility] with her parents for part of the winter.  She had a flu shot. Other pertinent findings checked/documented below  _0  fevers  _1  palpitations _2  urinary frequency   _3  chills _4  chest pain _5  dysuria    _6  dry mouth _7  nausea/emesis  _8  rash   _9  ear pain _10  abdominal cramping _11  skin changes   _12  dry mouth _13  abdominal discomfort _14  pain   _15  sore throat _16  loose stool, non-formed _17  fatigue   _18  cough  _19  diarrhea, watery _20  enlarged lymph nodes   _21  sputum production  _22  weight loss _23  Joint pains   _24  shortness of breath _25  insufficient oral intake _26  none of the above     Medications  Reviewed medications with patient today & electronically reconciled  Current Outpatient Medications on File Prior to Visit   Medication Sig Dispense Refill    metoprolol (LOPRESSOR) 50 MG tablet TAKE 1 TABLET (50 MG TOTAL) BY MOUTH 2 TIMES DAILY 60 tablet 2    levothyroxine (SYNTHROID, LEVOTHROID)  50 MCG tablet Take 50 mcg by mouth daily (before breakfast)      clotrimazole (LOTRIMIN) 1 % cream Apply topically 2 times daily 15 g 6    pantoprazole (PROTONIX) 40 MG EC tablet TAKE 1 TABLET (40 MG TOTAL) BY MOUTH 2 TIMES DAILY SWALLOW WHOLE. DO NOT CRUSH, BREAK, OR CHEW. (Patient taking differently: TAKE 1 TABLET (40 MG TOTAL) BY MOUTH DAILY SWALLOW WHOLE. DO NOT CRUSH, BREAK, OR CHEW.) 60 tablet 3    acyclovir (ZOVIRAX) 400 MG tablet Take 1 tablet (400 mg total) by mouth 2 times daily 180 tablet 2    escitalopram (LEXAPRO) 20 MG tablet Take 20 mg by mouth daily  5    aspirin 81 MG EC tablet Take 81 mg by mouth daily      ondansetron (ZOFRAN) 4 MG tablet Take 1 tablet (4 mg total) by mouth 3 times daily as needed (nausea) (Patient not taking: Reported on 11/14/2017) 60 tablet 3    traMADol (ULTRAM) 50 MG tablet Take 1 tablet (50 mg total) by mouth every 6 hours as needed for Pain   Max daily dose: 200 mg (Patient not taking: Reported on 11/14/2017) 60 tablet 0    prochlorperazine (COMPAZINE) 10 MG tablet Take 1 tablet (10 mg total) by mouth 4 times daily as needed for  Nausea (Patient not taking: Reported on 11/14/2017) 40 tablet 6     No current facility-administered medications on file prior to visit.      VS, weight, KPS  BP 160/72 (BP Location: Left arm, Patient Position: Sitting, Cuff Size: adult)    Pulse 69    Temp 36 C (96.8 F) (Temporal)    Resp 16    Ht 167.6 cm (5' 5.98")    Wt 126 kg (277 lb 12.5 oz)    SpO2 97%    BMI 44.86 kg/m     Wt Readings from Last 3 Encounters:   11/14/17 126 kg (277 lb 12.5 oz)   08/15/17 120.4 kg (265 lb 5.2 oz)   05/09/17 121.3 kg (267 lb 6.7 oz)       KPS%: 80    Physical Exam   General Appearance:  Mental status  NAD  Tearful at times but well-groomed and conversive.    Head:  Normocephalic, atraumatic   Eyes:  Conjunctivae/corneas clear, no eye drainage   Oral/Throat: MMM   Neck: Supple, normal turgor; no adenopathy.    Heart: S1, S2 normal, regular rhythm, no  murmur, rub or gallop   Lungs:   Respirations unlabored, clear to A&P auscultation bilaterally   Abdomen:   Soft, non-tender, bowel sounds active all four quadrants, tympanic, no masses, no organomegaly   Extremities: No edema   Pulses: Not checked.    Skin: Skin tanned.  Fungal appearing rash beneath both breasts with some satellite lesions; none in the bones.      Recent Results (from the past 24 hour(s))   Magnesium    Collection Time: 11/14/17  2:31 PM   Result Value Ref Range    Magnesium 2.1 1.6 - 2.5 mg/dL   Lactate dehydrogenase    Collection Time: 11/14/17  2:31 PM   Result Value Ref Range    LD 175 118 - 225 U/L   Comprehensive metabolic panel    Collection Time: 11/14/17  2:31 PM   Result Value Ref Range    Sodium 139 133 - 145 mmol/L    Potassium 4.6 3.3 - 5.1 mmol/L    Chloride 101 96 - 108 mmol/L    CO2 27 20 - 28 mmol/L    Anion Gap 11 7 - 16    UN 19 6 - 20 mg/dL    Creatinine 0.81 0.51 - 0.95 mg/dL    GFR,Caucasian 81 *    GFR,Black 93 *    Glucose 153 (H) 60 - 99 mg/dL    Calcium 9.4 8.6 - 10.2 mg/dL    Total Protein 6.6 6.3 - 7.7 g/dL    Albumin 4.1 3.5 - 5.2 g/dL    Bilirubin,Total 0.3 0.0 - 1.2 mg/dL    AST 24 0 - 35 U/L    ALT 30 0 - 35 U/L    Alk Phos 99 35 - 105 U/L   CBC and differential    Collection Time: 11/14/17  2:31 PM   Result Value Ref Range    WBC 6.6 4.0 - 10.0 THOU/uL    RBC 4.4 3.9 - 5.2 MIL/uL    Hemoglobin 12.8 11.2 - 15.7 g/dL    Hematocrit 41 34 - 45 %    MCV 93 79 - 95 fL    MCH 29 26 - 32 pg/cell    MCHC 32 32 - 36 g/dL    RDW 13.7 11.7 - 14.4 %    Platelets 231 160 - 370  THOU/uL    Seg Neut % 55.6 %    Lymphocyte % 29.3 %    Monocyte % 8.9 %    Eosinophil % 5.1 %    Basophil % 0.5 %    Neut # K/uL 3.7 1.6 - 6.1 THOU/uL    Lymph # K/uL 1.9 1.2 - 3.7 THOU/uL    Mono # K/uL 0.6 0.2 - 0.9 THOU/uL    Eos # K/uL 0.3 0.0 - 0.4 THOU/uL    Baso # K/uL 0.0 0.0 - 0.1 THOU/uL    Nucl RBC % 0.0 0.0 - 0.2 /100 WBC    Nucl RBC # K/uL 0.0 0.0 - 0.0 THOU/uL    IMM Granulocytes # 0.0  THOU/uL    IMM Granulocytes 0.6 %   Neutrophil #-Instrument    Collection Time: 11/14/17  2:31 PM   Result Value Ref Range    Neutrophil #-Instrument 3.7 THOU/uL       Assessment and Plan  Carly Higgins is a 57 yo female with unclassifiable myeloproliferative neoplasm and completed Decitabine '20mg'$ /m2 IV daily x 5 days - Cycle # 1 12/15/14 and had been on Hydrea prior to transplant. 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 She is now 29 months from transplant.     Atypical MPN S/P URD allo, now at 23 months.   Day 30 marrow (6/6) hypercellular marrow, no evidence of malignancy, chimerism 2% recipient  Day 100 BMbx (09/15/15) Normochromic normocytic anemia. Mild lymphopenia. Mildly hypercellular bone marrow with trilineage hematopoiesis. Chimerism 2% recipient.  1 year marrow with normocellularity and 100% donor chimerism   Blood counts remain stable without any signs of relapse    GVHD  Full dose MTX given day 1,3,6. Day 11 mtx held due to elevated bilirubin and mucositis  Off tacrolimus  Prior elevation in LFTs have resolved. Dry mouth improved.   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     ID  Hx Cholecystitis, pneumonia 05/2015  CMV PCR not required both negative   Will continue acyclovir for now.    Completing vaccines;having a panel today with last of two shingrix vaccines.  She has MMR left (June, 2020).  Had flu shot .    CV/Pulm  Hx A-flutter  Cont Metoprolol 50 mg BID  Followed by Dr. Kathi Der, Cardiology  Off cardizem, stopped by Dr. Kathi Der  Possible OSA,    Acute KI on CKD r/t tacrolimus- resolving  Creatinine stable  Encouraged PO intake    GI  Cont protonix; especially as she is using NSAIDs but not as much as in past. .   Zofran prn     MSK  Pain from elbow to wrist; with tingling of right middle fingers; has wrist splint to use.   Pain in  bilateral knees with shooting sensation when attempting to stand-and bilateral hip pain; No AVN on hip films; still has pain over right sciatic notch. This has become severe and disabling to her.  Follows with orthopedics and has obtained injections; this is much better now.     Smoking cessation   Has been able to abstain thus far    Depression--somewhat situational    Derm--hs intertriginous rash--    Daily nausea--will ask her see GI for possible endospcopy to r/o ulcer, gastritis, UGI GVHD (doubt this far from transplant)     Follow up  BMT  clinic:[facility], 2020  Bloodwork frequency: labs prior      Cheron Schaumann, MD

## 2017-11-28 ENCOUNTER — Other Ambulatory Visit: Payer: Self-pay | Admitting: Hematology

## 2017-11-28 DIAGNOSIS — Z9481 Bone marrow transplant status: Secondary | ICD-10-CM

## 2017-12-15 ENCOUNTER — Other Ambulatory Visit: Payer: Self-pay | Admitting: Hematology

## 2017-12-15 DIAGNOSIS — Z9481 Bone marrow transplant status: Secondary | ICD-10-CM

## 2017-12-19 ENCOUNTER — Other Ambulatory Visit: Payer: Self-pay | Admitting: Sports Medicine

## 2017-12-19 DIAGNOSIS — M1610 Unilateral primary osteoarthritis, unspecified hip: Secondary | ICD-10-CM

## 2017-12-26 ENCOUNTER — Telehealth: Payer: Self-pay | Admitting: Hematology

## 2017-12-26 DIAGNOSIS — R110 Nausea: Secondary | ICD-10-CM

## 2017-12-26 DIAGNOSIS — Z9481 Bone marrow transplant status: Secondary | ICD-10-CM

## 2017-12-26 MED ORDER — ONDANSETRON HCL 4 MG PO TABS *I*
4.0000 mg | ORAL_TABLET | Freq: Three times a day (TID) | ORAL | 3 refills | Status: DC | PRN
Start: 2017-12-26 — End: 2018-02-18

## 2017-12-26 NOTE — Telephone Encounter (Signed)
zofran sent to [facility] in [facility] as requested

## 2018-01-17 ENCOUNTER — Other Ambulatory Visit: Payer: Self-pay | Admitting: Hematology

## 2018-01-17 DIAGNOSIS — Z9481 Bone marrow transplant status: Secondary | ICD-10-CM

## 2018-02-17 ENCOUNTER — Other Ambulatory Visit: Payer: Self-pay | Admitting: Hematology

## 2018-02-17 DIAGNOSIS — Z9481 Bone marrow transplant status: Secondary | ICD-10-CM

## 2018-02-18 ENCOUNTER — Telehealth: Payer: Self-pay | Admitting: Hematology

## 2018-02-18 ENCOUNTER — Telehealth: Payer: Self-pay

## 2018-02-18 DIAGNOSIS — K219 Gastro-esophageal reflux disease without esophagitis: Secondary | ICD-10-CM

## 2018-02-18 DIAGNOSIS — R110 Nausea: Secondary | ICD-10-CM

## 2018-02-18 DIAGNOSIS — Z9481 Bone marrow transplant status: Secondary | ICD-10-CM

## 2018-02-18 MED ORDER — ACYCLOVIR 400 MG PO TABS *I*
400.0000 mg | ORAL_TABLET | Freq: Two times a day (BID) | ORAL | 3 refills | Status: DC
Start: 2018-02-18 — End: 2018-07-10

## 2018-02-18 MED ORDER — PANTOPRAZOLE SODIUM 40 MG PO TBEC *I*
DELAYED_RELEASE_TABLET | ORAL | 3 refills | Status: DC
Start: 2018-02-18 — End: 2018-10-27

## 2018-02-18 MED ORDER — METOPROLOL TARTRATE 50 MG PO TABS *I*
50.0000 mg | ORAL_TABLET | Freq: Two times a day (BID) | ORAL | 2 refills | Status: DC
Start: 2018-02-18 — End: 2018-06-09

## 2018-02-18 MED ORDER — ONDANSETRON HCL 4 MG PO TABS *I*
4.0000 mg | ORAL_TABLET | Freq: Three times a day (TID) | ORAL | 3 refills | Status: DC | PRN
Start: 2018-02-18 — End: 2018-07-10

## 2018-02-18 NOTE — Telephone Encounter (Signed)
Returned call to patient   Refills sent to provider with new pharmacy per request

## 2018-02-18 NOTE — Telephone Encounter (Signed)
Called patient to reschedule bumped NPV appointment with Isabella Stalling. No answer left voicemail and mailed letter.

## 2018-02-27 ENCOUNTER — Telehealth: Payer: Self-pay

## 2018-02-27 NOTE — Telephone Encounter (Signed)
Copied from [place] [id number]. Topic: Return Call - Schedule Appointment  >> Feb 27, 2018  8:05 AM Lillia Carmel D wrote:  Carly Higgins is returning a call from [facility]. She states this is regarding rescheduling bumped appt. Writer rescheduled patient for:    Date: 05/12/18  Time: 8am  Location: [facility]  Provider: C. Casper Harrison    The patient can be reached if necessary at [phone].

## 2018-03-03 ENCOUNTER — Ambulatory Visit: Payer: Medicare Other | Attending: Sports Medicine | Admitting: Sports Medicine

## 2018-03-03 VITALS — BP 134/80 | Ht 65.98 in | Wt 277.0 lb

## 2018-03-03 DIAGNOSIS — M1610 Unilateral primary osteoarthritis, unspecified hip: Secondary | ICD-10-CM

## 2018-03-03 NOTE — Progress Notes (Addendum)
Chief Complaint:  Hip pain.    History of Present Illness:  This is a 58 y.o. female who presents for follow up regarding her right hip pain. Last seen on 05/02/17 for this complaint. She received a right intra-articular hip injection with 70% relief over 4-5 months. Patient now experiencing left hip pain as a result from her poor gait and attempting to offload her right hip. Has pain when trying to sleep on right side. Patient has been unable to lose weight. She reports she is not interested in surgical correction of this problem. Patient started smoking cigarettes again in September 2019 after the loss of her dog. Currently smokes 3-4 cigarettes. Patent reports her groin pain is the most significant but also has greater troch pain as well. Hoping to have her right hip injected today with possible left hip in the future. Patient does not want to explore surgical options at this point in time.        Treatment History:    Physical therapy: too painful   Yes _0  No _1  Helpful _2  Somewhat helpful _3  Not helpful _4     Exercise: too painful  Yes _5  No _6  Helpful _7  Somewhat helpful _8  Not helpful _9     Activity modification:   Yes _10  No _11  Helpful _12  Somewhat helpful _13  Not helpful _14     Brace:  Yes _15   No _16   Helpful _17   Somewhat helpful _18   Not helpful _19      Acetaminophen:   Yes _20  No _21  Helpful _22  Somewhat helpful _23  Not helpful _24     NSAIDs:  Ibuprofen 876m daily   Yes _25  No _26  Helpful _27  Somewhat helpful _28  Not helpful _29      Topicals:  Yes _30   No _31   Helpful _32   Somewhat helpful _33   Not helpful _34      Other Medications: Lexapro  Yes _35   No _36   Helpful _37   Somewhat helpful _38   Not helpful _39      Alternative Treatments:  Yes _40   No _41   Helpful _42   Somewhat helpful _43   Not helpful _44      Corticosteroid injections:   Right intra-articular hip and greater trochanteric bursa  Yes _45  No _46  Helpful _47  Somewhat helpful _48  Not helpful _49     Viscosupplementation:   Yes _50  No _51  Helpful _52  Somewhat helpful _53   Not helpful _54     Radiofrequency Ablation:  Yes _55   No _56   Helpful _57   Somewhat helpful _58   Not helpful _59      Surgery:   Yes _60  No _61  Helpful _62  Somewhat helpful _63  Not helpful _64       Past Medical History:  Past Medical History:   Diagnosis Date    Atrial fibrillation     fib/flutter 05/2015    CHF (congestive heart failure)     pulmonary edema 05/28/15    GERD (gastroesophageal reflux disease)     Hypoxia 06/11/2015    Impaired mobility and ADLs 06/11/2015    Leukocytosis 09/22/2013    post allogenic MUD (F)  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    Conditioned w/ Busulfan 130 mg/m2/d and Fludarabine 40 mg/m2/d x4 days followed by 10/10 HLA MUD (F) Allogeneic PBSCT (pt/donor: ABO: A+/A+, CMV N/N)    Pulmonary edema     Shortness of breath        Past Surgical History:  Past Surgical History:   Procedure Laterality Date    ROTATOR CUFF REPAIR Right  Social History:  Social History     Socioeconomic History    Marital status: Divorced     Spouse name: Not on file    Number of children: Not on file    Years of education: Not on file    Highest education level: Not on file   Occupational History    Not on file   Tobacco Use    Smoking status: Former Smoker     Packs/day: 0.50     Years: 30.00     Pack years: 15.00     Last attempt to quit: 05/11/2015     Years since quitting: 2.8    Smokeless tobacco: Never Used    Tobacco comment: nicoderm patches started 05/11/15   Substance and Sexual Activity    Alcohol use: No     Comment: rare     Drug use: No    Sexual activity: Not Currently   Social History Narrative    Not on file         Family History:  Family History   Problem Relation Age of Onset    No Known Problems Mother     No Known Problems Father     No Known Problems Sister     No Known Problems Brother     Breast cancer Paternal Aunt        Review of Systems:  A complete 12 point review of systems is negative except as in HPI and listed below:  Bilateral Hip pain      Medications:  Current  Outpatient Medications on File Prior to Visit   Medication Sig Dispense Refill    acyclovir (ZOVIRAX) 400 MG tablet Take 1 tablet (400 mg total) by mouth 2 times daily 60 tablet 3    ondansetron (ZOFRAN) 4 MG tablet Take 1 tablet (4 mg total) by mouth 3 times daily as needed (nausea) 60 tablet 3    metoprolol (LOPRESSOR) 50 MG tablet Take 1 tablet (50 mg total) by mouth 2 times daily 60 tablet 2    pantoprazole (PROTONIX) 40 MG EC tablet TAKE 1 TABLET (40 MG TOTAL) BY MOUTH 2 TIMES DAILY SWALLOW WHOLE. DO NOT CRUSH, BREAK, OR CHEW. 60 tablet 3    meloxicam (MOBIC) 15 MG tablet TAKE 1 TABLET (15 MG TOTAL) BY MOUTH DAILY TAKE WITH FOOD. 30 tablet 1    levothyroxine (SYNTHROID, LEVOTHROID) 50 MCG tablet Take 50 mcg by mouth daily (before breakfast)      clotrimazole (LOTRIMIN) 1 % cream Apply topically 2 times daily 15 g 6    traMADol (ULTRAM) 50 MG tablet Take 1 tablet (50 mg total) by mouth every 6 hours as needed for Pain   Max daily dose: 200 mg (Patient not taking: Reported on 11/14/2017) 60 tablet 0    escitalopram (LEXAPRO) 20 MG tablet Take 20 mg by mouth daily  5    aspirin 81 MG EC tablet Take 81 mg by mouth daily      prochlorperazine (COMPAZINE) 10 MG tablet Take 1 tablet (10 mg total) by mouth 4 times daily as needed for Nausea (Patient not taking: Reported on 11/14/2017) 40 tablet 6     No current facility-administered medications on file prior to visit.        Allergies:  No Known Allergies (drug, envir, food or latex)      Physical Exam:  There were no vitals filed for this visit.  Blood pressure 134/80, height 1.676 m (5' 5.98"), weight 125.6 kg (  277 lb).  Taken by patient care tech    General: NAD, obese  HEENT: MMM   CVS: good perfusion   Pulm: normal work of breathing   Abd: benign   Skin: no rashes   Pysch: normal affect  MSK/neuro:     Hip/ Low back exam:  Inspection reveals no obvious lumbosacral spinal deformity.  No scars, rashes or other skin lesions.  No obvious hip deformity.  No  scars, rashes or other skin lesions.  No significant atrophy of thighs and calves bilaterally.   ROM- Hip IR bilaterally is significantly reduced with concordant pain,  ER limited with moderate pain  ROM- Lumbar flexion is achieved to the ankles without pain. Lumbar extension is full without pain.  Palpation over the greater trochanter is mildly painful but lumbar spinous processes, paraspinal muscles and bilateral PSIS were non pain provoking.   Strength 5/5 bilateral lower extremities.  Sensation is intact to light touch in bilateral lower extremities.  Straight leg raise negative.   Stinchfield test positive  FABER test positive.  Logroll positive  Gait antalgic    Assessment:  This is a 58 y.o. 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 Please see procedure note  -Follow-up: Will schedule left hip intraarticular injection in coming weeks     Thank you for allowing [place] to take part in this patient's care and contact us if you have any questions.    Patient seen and assessed with attending, Dr. Marjory Lies, MD    Levonne Spiller, DO  PGY2  11:49 AM  03/03/18    Attestation:    I saw and evaluated the patient. I have reviewed and edited the resident's/fellow's note and confirm the findings and plan of care as documented above.  I was present for procedure.    Edman Circle, MD, [place]  [facility]  Attending Physician

## 2018-03-07 ENCOUNTER — Ambulatory Visit: Payer: No Typology Code available for payment source | Admitting: Internal Medicine

## 2018-03-07 MED ORDER — LIDOCAINE HCL 2 % IJ SOLN *I*
5.0000 mL | Freq: Once | INTRAMUSCULAR | Status: AC | PRN
Start: 2018-03-07 — End: 2018-03-07
  Administered 2018-03-07: 5 mL via INTRA_ARTICULAR

## 2018-03-07 MED ORDER — LIDOCAINE HCL 2 % IJ SOLN *I*
1.0000 mL | Freq: Once | INTRAMUSCULAR | Status: AC | PRN
Start: 2018-03-07 — End: 2018-03-07
  Administered 2018-03-07: 1 mL via INTRA_ARTICULAR

## 2018-03-07 MED ORDER — BETAMETHASONE ACET & SOD PHOS 6 (3-3) MG/ML IJ SUSP *I*
12.0000 mg | Freq: Once | INTRAMUSCULAR | Status: AC | PRN
Start: 2018-03-07 — End: 2018-03-07
  Administered 2018-03-07: 12 mg via INTRA_ARTICULAR

## 2018-03-07 MED ORDER — LIDOCAINE HCL 2 % IJ SOLN *I*
2.0000 mL | Freq: Once | INTRAMUSCULAR | Status: AC | PRN
Start: 2018-03-07 — End: 2018-03-07
  Administered 2018-03-07: 16:00:00 2 mL via INTRA_ARTICULAR

## 2018-03-07 NOTE — Procedures (Signed)
Large Joint Aspiration/Injection Procedure: R hip joint    Date/Time: 03/03/2018 11:00 AM EST  Consent given by: patient  Site marked: site marked  Timeout: Immediately prior to procedure a time out was called to verify the correct patient, procedure, equipment, support staff and site/side marked as required     Procedure Details    Location: hip - R hip joint  Preparation: The site was prepped using the usual aseptic technique.  Ultrasound guidance:  Ultrasound was utilized to improve needle visualization, injection accuracy, and anatomic localization.    Anesthetics administered: 5 mL lidocaine 2 %; 1 mL lidocaine 2 %; 2 mL lidocaine 2 %  Intra-Articular Steroids administered: 12 mg betamethasone acetate & sodium phosphate 6 (3-3) MG/ML  Dressing:  A dry, sterile dressing was applied.  Patient tolerance: patient tolerated the procedure well with no immediate complications

## 2018-05-01 ENCOUNTER — Telehealth: Payer: Self-pay | Admitting: Gastroenterology

## 2018-05-01 NOTE — Telephone Encounter (Signed)
Call Outcome:  Patient requested to cancel appointment    In the midst of the [facility] crisis, [facility] has initiated a pandemic response.  [facility] is postponing all elective procedures and clinic visits. Do not come to your scheduled appointment.  A provider has reviewed your chart and will contact you around the time of your previously scheduled appointment. Please be aware that you may be called from a private number. Confirmation of this message is required to avoid cancellation of your appointment, please call us back at [phone].    What is the best number to contact you?   Phone number: [phone]

## 2018-05-07 ENCOUNTER — Telehealth: Payer: Self-pay | Admitting: Hematology

## 2018-05-07 NOTE — Telephone Encounter (Signed)
Changed appointment as requested.  Cancelled vaccine appointment

## 2018-05-07 NOTE — Telephone Encounter (Signed)
Patient agreeable to telehome visit, doesn't want to come in for visit.   Will ask schedulers to change visit.

## 2018-05-12 ENCOUNTER — Ambulatory Visit: Payer: No Typology Code available for payment source | Admitting: Gastroenterology

## 2018-05-13 ENCOUNTER — Other Ambulatory Visit
Admission: RE | Admit: 2018-05-13 | Discharge: 2018-05-13 | Disposition: A | Discharge status: Home / Self Care | Payer: Medicare Other | Source: Ambulatory Visit | Attending: Oncology | Admitting: Oncology

## 2018-05-13 DIAGNOSIS — Z9481 Bone marrow transplant status: Secondary | ICD-10-CM | POA: Insufficient documentation

## 2018-05-13 DIAGNOSIS — D471 Chronic myeloproliferative disease: Secondary | ICD-10-CM

## 2018-05-13 LAB — CBC AND DIFFERENTIAL
Baso # K/uL: 0.1 10*3/uL (ref 0.0–0.1)
Basophil %: 0.7 %
Eos # K/uL: 0.5 10*3/uL — ABNORMAL HIGH (ref 0.0–0.4)
Eosinophil %: 4.8 %
Hematocrit: 44 % (ref 34–45)
Hemoglobin: 14 g/dL (ref 11.2–15.7)
IMM Granulocytes #: 0.1 10*3/uL — ABNORMAL HIGH (ref 0.0–0.0)
IMM Granulocytes: 0.9 %
Lymph # K/uL: 2.8 10*3/uL (ref 1.2–3.7)
Lymphocyte %: 28.3 %
MCH: 30 pg/cell (ref 26–32)
MCHC: 32 g/dL (ref 32–36)
MCV: 95 fL (ref 79–95)
Mono # K/uL: 0.8 10*3/uL (ref 0.2–0.9)
Monocyte %: 7.7 %
Neut # K/uL: 5.7 10*3/uL (ref 1.6–6.1)
Nucl RBC # K/uL: 0 10*3/uL (ref 0.0–0.0)
Nucl RBC %: 0 /100 WBC (ref 0.0–0.2)
Platelets: 252 10*3/uL (ref 160–370)
RBC: 4.7 MIL/uL (ref 3.9–5.2)
RDW: 14.1 % (ref 11.7–14.4)
Seg Neut %: 57.6 %
WBC: 9.9 10*3/uL (ref 4.0–10.0)

## 2018-05-13 LAB — COMPREHENSIVE METABOLIC PANEL
ALT: 33 U/L (ref 0–35)
AST: 25 U/L (ref 0–35)
Albumin: 4.3 g/dL (ref 3.5–5.2)
Alk Phos: 100 U/L (ref 35–105)
Anion Gap: 12 (ref 7–16)
Bilirubin,Total: 0.3 mg/dL (ref 0.0–1.2)
CO2: 27 mmol/L (ref 20–28)
Calcium: 9.8 mg/dL (ref 8.6–10.2)
Chloride: 99 mmol/L (ref 96–108)
Creatinine: 0.96 mg/dL — ABNORMAL HIGH (ref 0.51–0.95)
GFR,Black: 75 *
GFR,Caucasian: 65 *
Glucose: 124 mg/dL — ABNORMAL HIGH (ref 60–99)
Lab: 19 mg/dL (ref 6–20)
Potassium: 5.6 mmol/L — ABNORMAL HIGH (ref 3.3–5.1)
Sodium: 138 mmol/L (ref 133–145)
Total Protein: 6.8 g/dL (ref 6.3–7.7)

## 2018-05-13 LAB — LACTATE DEHYDROGENASE: LD: 157 U/L (ref 118–225)

## 2018-05-13 LAB — MAGNESIUM: Magnesium: 2.2 mg/dL (ref 1.6–2.5)

## 2018-05-13 LAB — NEUTROPHIL #-INSTRUMENT: Neutrophil #-Instrument: 5.7 10*3/uL

## 2018-05-15 ENCOUNTER — Ambulatory Visit: Payer: No Typology Code available for payment source | Attending: Hematology | Admitting: Hematology

## 2018-05-15 ENCOUNTER — Ambulatory Visit: Payer: No Typology Code available for payment source

## 2018-05-15 DIAGNOSIS — D471 Chronic myeloproliferative disease: Secondary | ICD-10-CM | POA: Insufficient documentation

## 2018-05-15 NOTE — Progress Notes (Signed)
Telephone Visit     This is an established patient visit.    Reason for visit: COVID-19 Concern and Follow-up (post-stem cell transplant)      HPI:  Carly Higgins main issue remains her arthritis and diffuse aching . The main site of difficulty is the right groin and flank area.  She had an injection; ? Cortisone,and this did not seem to help much.  She is going to get a new Westie, so she is hoping the puppy will keep her active. She still has a rash beneath the breasts.  It has gotten better in the last couple weeks. She had a prior Staph skin inffection as well.  She has some dyspnea with exertion . No eye or mouth symptoms  GI symtoms stable but still using compazine.  No urinary symptoms . She is staying isolated during [place] other than to help her elderly parents.     Current Outpatient Medications   Medication Sig Dispense Refill    acyclovir (ZOVIRAX) 400 MG tablet Take 1 tablet (400 mg total) by mouth 2 times daily 60 tablet 3    metoprolol (LOPRESSOR) 50 MG tablet Take 1 tablet (50 mg total) by mouth 2 times daily 60 tablet 2    pantoprazole (PROTONIX) 40 MG EC tablet TAKE 1 TABLET (40 MG TOTAL) BY MOUTH 2 TIMES DAILY SWALLOW WHOLE. DO NOT CRUSH, BREAK, OR CHEW. 60 tablet 3    meloxicam (MOBIC) 15 MG tablet TAKE 1 TABLET (15 MG TOTAL) BY MOUTH DAILY TAKE WITH FOOD. 30 tablet 1    levothyroxine (SYNTHROID, LEVOTHROID) 50 MCG tablet Take 50 mcg by mouth daily (before breakfast)      traMADol (ULTRAM) 50 MG tablet Take 1 tablet (50 mg total) by mouth every 6 hours as needed for Pain   Max daily dose: 200 mg 60 tablet 0    escitalopram (LEXAPRO) 20 MG tablet Take 20 mg by mouth daily  5    aspirin 81 MG EC tablet Take 81 mg by mouth daily      prochlorperazine (COMPAZINE) 10 MG tablet Take 1 tablet (10 mg total) by mouth 4 times daily as needed for Nausea 40 tablet 6    ondansetron (ZOFRAN) 4 MG tablet Take 1 tablet (4 mg total) by mouth 3 times daily as needed (nausea) (Patient not taking: Reported on  05/15/2018) 60 tablet 3    clotrimazole (LOTRIMIN) 1 % cream Apply topically 2 times daily 15 g 6     No current facility-administered medications for this visit.    NOTE; SHE NEVER STARTED THE TRAMADOL.     Patient's problem list, allergies, and medications were reviewed and updated as appropriate. Please see the EHR for full details.    Physical Exam:    This visit was performed during a pandemic event, thus the physical exam was not performed.      Recent Results (from the past 72 hour(s))   Magnesium    Collection Time: 05/13/18  9:19 AM   Result Value Ref Range    Magnesium 2.2 1.6 - 2.5 mg/dL   Lactate dehydrogenase    Collection Time: 05/13/18  9:19 AM   Result Value Ref Range    LD 157 118 - 225 U/L   Comprehensive metabolic panel    Collection Time: 05/13/18  9:19 AM   Result Value Ref Range    Sodium 138 133 - 145 mmol/L    Potassium 5.6 (H) 3.3 - 5.1 mmol/L    Chloride 99  96 - 108 mmol/L    CO2 27 20 - 28 mmol/L    Anion Gap 12 7 - 16    UN 19 6 - 20 mg/dL    Creatinine 0.96 (H) 0.51 - 0.95 mg/dL    GFR,Caucasian 65 *    GFR,Black 75 *    Glucose 124 (H) 60 - 99 mg/dL    Calcium 9.8 8.6 - 10.2 mg/dL    Total Protein 6.8 6.3 - 7.7 g/dL    Albumin 4.3 3.5 - 5.2 g/dL    Bilirubin,Total 0.3 0.0 - 1.2 mg/dL    AST 25 0 - 35 U/L    ALT 33 0 - 35 U/L    Alk Phos 100 35 - 105 U/L   CBC and differential    Collection Time: 05/13/18  9:19 AM   Result Value Ref Range    WBC 9.9 4.0 - 10.0 THOU/uL    RBC 4.7 3.9 - 5.2 MIL/uL    Hemoglobin 14.0 11.2 - 15.7 g/dL    Hematocrit 44 34 - 45 %    MCV 95 79 - 95 fL    MCH 30 26 - 32 pg/cell    MCHC 32 32 - 36 g/dL    RDW 14.1 11.7 - 14.4 %    Platelets 252 160 - 370 THOU/uL    Seg Neut % 57.6 %    Lymphocyte % 28.3 %    Monocyte % 7.7 %    Eosinophil % 4.8 %    Basophil % 0.7 %    Neut # K/uL 5.7 1.6 - 6.1 THOU/uL    Lymph # K/uL 2.8 1.2 - 3.7 THOU/uL    Mono # K/uL 0.8 0.2 - 0.9 THOU/uL    Eos # K/uL 0.5 (H) 0.0 - 0.4 THOU/uL    Baso # K/uL 0.1 0.0 - 0.1 THOU/uL    Nucl RBC  % 0.0 0.0 - 0.2 /100 WBC    Nucl RBC # K/uL 0.0 0.0 - 0.0 THOU/uL    IMM Granulocytes # 0.1 (H) 0.0 - 0.0 THOU/uL    IMM Granulocytes 0.9 %   Neutrophil #-Instrument    Collection Time: 05/13/18  9:19 AM   Result Value Ref Range    Neutrophil #-Instrument 5.7 THOU/uL       Assessment Plan:  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 She is now almost 36 months from transplant.     Atypical MPN S/P URD allo, now at 23 months.   Day 30 marrow (6/6) hypercellular marrow, no evidence of malignancy, chimerism 2% recipient  Day 100 BMbx (09/15/15) Normochromic normocytic anemia. Mild lymphopenia. Mildly hypercellular bone marrow with trilineage hematopoiesis. Chimerism 2% recipient.  1 year marrow with normocellularity and 100% donor chimerism   Blood counts remain stable without any signs of relapse    GVHD  Full dose MTX given day 1,3,6. Day 11 mtx held due to elevated bilirubin and mucositis  Off tacrolimus  Prior elevation in LFTs have resolved. Dry mouth improved.   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     ID  Hx Cholecystitis, pneumonia 05/2015  CMV PCR not required both negative   Will continue acyclovir for now.    Completing vaccines;having a panel today with last of two shingrix vaccines.  She has  MMR left (June, 2020).  Gets flu shot every autumn     CV/Pulm  Hx A-flutter  Cont Metoprolol 50 mg BID  Followed by Dr. Kathi Der, Cardiology  Off cardizem, stopped by Dr. Kathi Der  Possible OSA,    Acute KI on CKD r/t tacrolimus- resolving  Creatinine stable  Encouraged PO intake  Potassium is elevated; not on any medications that should contribute to that. Will follow .    GI  Cont protonix; especially as she is using NSAIDs occasionaly;   Zofran prn     MSK  Pain from elbow to wrist;  with tingling of right middle fingers; has wrist splint to use.   Follows with orthopedics     Derm--hs intertriginous rash--Will be seeing a dermatologist and has topical treatments to use.     The plan was discussed with the patient and the patient/patient rep demonstrated understanding to the provider's satisfaction.    Consent was previously obtained from the patient to complete this telephone consult; including the potential for financial liability.    21+ minutes were spent on the phone with the patient, patient representatives, and/or other attendees.               Cheron Schaumann, MD

## 2018-05-16 ENCOUNTER — Telehealth: Payer: Self-pay

## 2018-05-16 NOTE — Telephone Encounter (Signed)
Patient will repeat labs in a couple weeks per [name] for elevated K+

## 2018-06-09 ENCOUNTER — Other Ambulatory Visit: Payer: Self-pay

## 2018-06-09 DIAGNOSIS — Z9481 Bone marrow transplant status: Secondary | ICD-10-CM

## 2018-06-09 MED ORDER — METOPROLOL TARTRATE 50 MG PO TABS *I*
50.0000 mg | ORAL_TABLET | Freq: Two times a day (BID) | ORAL | 3 refills | Status: DC
Start: 2018-06-09 — End: 2019-04-16

## 2018-06-30 ENCOUNTER — Other Ambulatory Visit
Admission: RE | Admit: 2018-06-30 | Discharge: 2018-06-30 | Disposition: A | Discharge status: Home / Self Care | Payer: No Typology Code available for payment source | Source: Ambulatory Visit | Attending: Hematology | Admitting: Hematology

## 2018-06-30 DIAGNOSIS — D471 Chronic myeloproliferative disease: Secondary | ICD-10-CM

## 2018-06-30 LAB — CBC AND DIFFERENTIAL
Baso # K/uL: 0 10*3/uL (ref 0.0–0.1)
Basophil %: 0.5 %
Eos # K/uL: 0.5 10*3/uL — ABNORMAL HIGH (ref 0.0–0.4)
Eosinophil %: 6.3 %
Hematocrit: 40 % (ref 34–45)
Hemoglobin: 12.7 g/dL (ref 11.2–15.7)
IMM Granulocytes #: 0.1 10*3/uL — ABNORMAL HIGH (ref 0.0–0.0)
IMM Granulocytes: 0.6 %
Lymph # K/uL: 2 10*3/uL (ref 1.2–3.7)
Lymphocyte %: 24.7 %
MCH: 31 pg/cell (ref 26–32)
MCHC: 31 g/dL — ABNORMAL LOW (ref 32–36)
MCV: 97 fL — ABNORMAL HIGH (ref 79–95)
Mono # K/uL: 0.5 10*3/uL (ref 0.2–0.9)
Monocyte %: 6.8 %
Neut # K/uL: 4.9 10*3/uL (ref 1.6–6.1)
Nucl RBC # K/uL: 0 10*3/uL (ref 0.0–0.0)
Nucl RBC %: 0 /100 WBC (ref 0.0–0.2)
Platelets: 232 10*3/uL (ref 160–370)
RBC: 4.2 MIL/uL (ref 3.9–5.2)
RDW: 13.6 % (ref 11.7–14.4)
Seg Neut %: 61.1 %
WBC: 8 10*3/uL (ref 4.0–10.0)

## 2018-06-30 LAB — COMPREHENSIVE METABOLIC PANEL
ALT: 34 U/L (ref 0–35)
AST: 23 U/L (ref 0–35)
Albumin: 3.9 g/dL (ref 3.5–5.2)
Alk Phos: 96 U/L (ref 35–105)
Anion Gap: 14 (ref 7–16)
Bilirubin,Total: 0.3 mg/dL (ref 0.0–1.2)
CO2: 24 mmol/L (ref 20–28)
Calcium: 9.4 mg/dL (ref 8.6–10.2)
Chloride: 102 mmol/L (ref 96–108)
Creatinine: 0.89 mg/dL (ref 0.51–0.95)
GFR,Black: 83 *
GFR,Caucasian: 72 *
Glucose: 131 mg/dL — ABNORMAL HIGH (ref 60–99)
Lab: 18 mg/dL (ref 6–20)
Potassium: 5.1 mmol/L (ref 3.3–5.1)
Sodium: 140 mmol/L (ref 133–145)
Total Protein: 6.2 g/dL — ABNORMAL LOW (ref 6.3–7.7)

## 2018-06-30 LAB — MULTIPLE ORDERING DOCS

## 2018-06-30 LAB — LACTATE DEHYDROGENASE: LD: 146 U/L (ref 118–225)

## 2018-06-30 LAB — NEUTROPHIL #-INSTRUMENT: Neutrophil #-Instrument: 4.9 10*3/uL

## 2018-07-07 ENCOUNTER — Other Ambulatory Visit: Payer: Self-pay | Admitting: Sports Medicine

## 2018-07-07 DIAGNOSIS — M1610 Unilateral primary osteoarthritis, unspecified hip: Secondary | ICD-10-CM

## 2018-07-09 MED ORDER — MELOXICAM 15 MG PO TABS *I*
15.0000 mg | ORAL_TABLET | Freq: Every day | ORAL | 1 refills | Status: DC
Start: 2018-07-09 — End: 2019-08-31

## 2018-07-10 ENCOUNTER — Telehealth: Payer: Self-pay | Admitting: Hematology

## 2018-07-10 ENCOUNTER — Other Ambulatory Visit: Payer: Self-pay

## 2018-07-10 DIAGNOSIS — Z9481 Bone marrow transplant status: Secondary | ICD-10-CM

## 2018-07-10 DIAGNOSIS — R110 Nausea: Secondary | ICD-10-CM

## 2018-07-10 MED ORDER — ACYCLOVIR 400 MG PO TABS *I*
400.0000 mg | ORAL_TABLET | Freq: Two times a day (BID) | ORAL | 3 refills | Status: DC
Start: 2018-07-10 — End: 2019-08-17

## 2018-07-10 MED ORDER — ONDANSETRON HCL 4 MG PO TABS *I*
4.0000 mg | ORAL_TABLET | Freq: Three times a day (TID) | ORAL | 3 refills | Status: DC | PRN
Start: 2018-07-10 — End: 2019-04-01

## 2018-07-10 NOTE — Telephone Encounter (Signed)
Patient updated with lab results  Reviewed with [name]  zofran refill sent per request, patient stated understanding.

## 2018-08-18 NOTE — Progress Notes (Signed)
BMT SOCIAL WORK NOTE    Contact:  [facility] [phone] (fax)    SW received disability papers from [employer] [facility] re: Cynara Tatham. Lovena Le.  The patient's chart was reviewed and the forms were provided to [name] for review and signature.  After the signature is received the forms will be faxed to [employer] [facility], and a copy of the form will be entered into the patient's medical record.    [facility] SOCIAL WORK:   [facility] Social Work Therapist, nutritional:     Patient referred based on distress screen?: No      Social Work contact made?: No      Social Work Assessment:  Museum/gallery curator and Barriers to Care    Barriers to care:  Insurance    Social Work post assessment plan:  As needed follow-up and Referral  Referral:     Time spent on this encounter (in minutes):  [place], [place]  BMT/Leukemia Social Worker  [phone]

## 2018-08-27 ENCOUNTER — Telehealth: Payer: Self-pay | Admitting: Hematology

## 2018-08-27 NOTE — Telephone Encounter (Signed)
Verified with SW he has disability paperwork  Patient stated understanding.

## 2018-10-09 ENCOUNTER — Other Ambulatory Visit: Payer: Self-pay | Admitting: Sports Medicine

## 2018-10-09 DIAGNOSIS — M1610 Unilateral primary osteoarthritis, unspecified hip: Secondary | ICD-10-CM

## 2018-10-15 ENCOUNTER — Ambulatory Visit: Payer: No Typology Code available for payment source | Admitting: Sports Medicine

## 2018-10-21 ENCOUNTER — Ambulatory Visit: Payer: No Typology Code available for payment source | Admitting: Sports Medicine

## 2018-10-27 ENCOUNTER — Other Ambulatory Visit: Payer: Self-pay

## 2018-10-27 DIAGNOSIS — K219 Gastro-esophageal reflux disease without esophagitis: Secondary | ICD-10-CM

## 2018-10-27 DIAGNOSIS — Z9481 Bone marrow transplant status: Secondary | ICD-10-CM

## 2018-10-27 MED ORDER — PANTOPRAZOLE SODIUM 40 MG PO TBEC *I*
DELAYED_RELEASE_TABLET | ORAL | 3 refills | Status: DC
Start: 2018-10-27 — End: 2019-03-09

## 2018-10-30 ENCOUNTER — Ambulatory Visit: Payer: No Typology Code available for payment source | Admitting: Sports Medicine

## 2018-11-13 ENCOUNTER — Ambulatory Visit: Payer: No Typology Code available for payment source | Admitting: Sports Medicine

## 2018-11-18 ENCOUNTER — Ambulatory Visit: Payer: No Typology Code available for payment source | Admitting: Hematology

## 2018-11-19 ENCOUNTER — Telehealth: Payer: Self-pay | Admitting: Hematology

## 2018-11-20 ENCOUNTER — Ambulatory Visit
Payer: No Typology Code available for payment source | Attending: Hematology and Oncology | Admitting: Hematology and Oncology

## 2018-11-20 ENCOUNTER — Other Ambulatory Visit
Admission: RE | Admit: 2018-11-20 | Discharge: 2018-11-20 | Disposition: A | Discharge status: Home / Self Care | Payer: No Typology Code available for payment source | Source: Ambulatory Visit

## 2018-11-20 DIAGNOSIS — Z9481 Bone marrow transplant status: Secondary | ICD-10-CM | POA: Insufficient documentation

## 2018-11-20 DIAGNOSIS — F32A Depression, unspecified: Secondary | ICD-10-CM

## 2018-11-20 DIAGNOSIS — D471 Chronic myeloproliferative disease: Secondary | ICD-10-CM | POA: Insufficient documentation

## 2018-11-20 DIAGNOSIS — F329 Major depressive disorder, single episode, unspecified: Secondary | ICD-10-CM | POA: Insufficient documentation

## 2018-11-20 LAB — CBC AND DIFFERENTIAL
Baso # K/uL: 0 10*3/uL (ref 0.0–0.1)
Basophil %: 0.5 %
Eos # K/uL: 0.4 10*3/uL (ref 0.0–0.4)
Eosinophil %: 5.7 %
Hematocrit: 44 % (ref 34–45)
Hemoglobin: 13.5 g/dL (ref 11.2–15.7)
IMM Granulocytes #: 0 10*3/uL (ref 0.0–0.0)
IMM Granulocytes: 0.5 %
Lymph # K/uL: 2.1 10*3/uL (ref 1.2–3.7)
Lymphocyte %: 32.1 %
MCH: 30 pg/cell (ref 26–32)
MCHC: 31 g/dL — ABNORMAL LOW (ref 32–36)
MCV: 96 fL — ABNORMAL HIGH (ref 79–95)
Mono # K/uL: 0.5 10*3/uL (ref 0.2–0.9)
Monocyte %: 8.4 %
Neut # K/uL: 3.4 10*3/uL (ref 1.6–6.1)
Nucl RBC # K/uL: 0 10*3/uL (ref 0.0–0.0)
Nucl RBC %: 0 /100 WBC (ref 0.0–0.2)
Platelets: 213 10*3/uL (ref 160–370)
RBC: 4.5 MIL/uL (ref 3.9–5.2)
RDW: 13.6 % (ref 11.7–14.4)
Seg Neut %: 52.8 %
WBC: 6.5 10*3/uL (ref 4.0–10.0)

## 2018-11-20 LAB — COMPREHENSIVE METABOLIC PANEL
ALT: 55 U/L — ABNORMAL HIGH (ref 0–35)
AST: 36 U/L — ABNORMAL HIGH (ref 0–35)
Albumin: 4.4 g/dL (ref 3.5–5.2)
Alk Phos: 93 U/L (ref 35–105)
Anion Gap: 9 (ref 7–16)
Bilirubin,Total: 0.4 mg/dL (ref 0.0–1.2)
CO2: 26 mmol/L (ref 20–28)
Calcium: 9.8 mg/dL (ref 8.6–10.2)
Chloride: 103 mmol/L (ref 96–108)
Creatinine: 0.79 mg/dL (ref 0.51–0.95)
GFR,Black: 95 *
GFR,Caucasian: 83 *
Glucose: 97 mg/dL (ref 60–99)
Lab: 14 mg/dL (ref 6–20)
Potassium: 5 mmol/L (ref 3.3–5.1)
Sodium: 138 mmol/L (ref 133–145)
Total Protein: 6.5 g/dL (ref 6.3–7.7)

## 2018-11-20 LAB — LACTATE DEHYDROGENASE: LD: 164 U/L (ref 118–225)

## 2018-11-20 LAB — NEUTROPHIL #-INSTRUMENT: Neutrophil #-Instrument: 3.4 10*3/uL

## 2018-11-20 NOTE — Progress Notes (Signed)
Telephone Visit     This is an established patient visit.    Reason for visit: MPN, s/p MUD allogeneic PBSCT    HPI:  Continues with groin pain that is unrelieved with prns but states it is overall stable.   Mother broke her hip which has placed a lot of caregiver responsibilities on her and has caused significant fatigue and increase in depressive symptoms. She is going to reach out to her PCP about possibly increasing her dose of lexapro or alternative treatment options.   Had a flare of rash under breasts when she ran out of clotrimazole, is going to refill her script this week.   Plans to go to [facility] for flu vaccine.        Current Outpatient Medications   Medication Sig Dispense Refill    cholecalciferol (VITAMIN D) 50 MCG (2000 UT) tablet Take 1,000 Units by mouth daily      pantoprazole (PROTONIX) 40 MG EC tablet TAKE 1 TABLET (40 MG TOTAL) BY MOUTH 2 TIMES DAILY SWALLOW WHOLE. DO NOT CRUSH, BREAK, OR CHEW. 60 tablet 3    ondansetron (ZOFRAN) 4 MG tablet Take 1 tablet (4 mg total) by mouth 3 times daily as needed (nausea) 60 tablet 3    acyclovir (ZOVIRAX) 400 MG tablet Take 1 tablet (400 mg total) by mouth 2 times daily 180 tablet 3    metoprolol (LOPRESSOR) 50 MG tablet Take 1 tablet (50 mg total) by mouth 2 times daily 120 tablet 3    levothyroxine (SYNTHROID, LEVOTHROID) 50 MCG tablet Take 50 mcg by mouth daily (before breakfast)      escitalopram (LEXAPRO) 20 MG tablet Take 20 mg by mouth daily  5    aspirin 81 MG EC tablet Take 81 mg by mouth daily      prochlorperazine (COMPAZINE) 10 MG tablet Take 1 tablet (10 mg total) by mouth 4 times daily as needed for Nausea 40 tablet 6    meloxicam (MOBIC) 15 MG tablet Take 1 tablet (15 mg total) by mouth daily Take with food. (Patient not taking: Reported on 11/20/2018) 30 tablet 1    clotrimazole (LOTRIMIN) 1 % cream Apply topically 2 times daily (Patient not taking: Reported on 11/20/2018) 15 g 6    traMADol (ULTRAM) 50 MG tablet Take 1 tablet  (50 mg total) by mouth every 6 hours as needed for Pain   Max daily dose: 200 mg (Patient not taking: Reported on 11/20/2018) 60 tablet 0     No current facility-administered medications for this visit.        Patient's problem list, allergies, and medications were reviewed and updated as appropriate. Please see the EHR for full details.    Physical Exam:    This visit was performed during a pandemic event, thus the physical exam was not performed.      Recent Results (from the past 72 hour(s))   CBC and differential    Collection Time: 11/20/18  9:42 AM   Result Value Ref Range    WBC 6.5 4.0 - 10.0 THOU/uL    RBC 4.5 3.9 - 5.2 MIL/uL    Hemoglobin 13.5 11.2 - 15.7 g/dL    Hematocrit 44 34 - 45 %    MCV 96 (H) 79 - 95 fL    MCH 30 26 - 32 pg/cell    MCHC 31 (L) 32 - 36 g/dL    RDW 13.6 11.7 - 14.4 %    Platelets 213 160 - 370  THOU/uL    Seg Neut % 52.8 %    Lymphocyte % 32.1 %    Monocyte % 8.4 %    Eosinophil % 5.7 %    Basophil % 0.5 %    Neut # K/uL 3.4 1.6 - 6.1 THOU/uL    Lymph # K/uL 2.1 1.2 - 3.7 THOU/uL    Mono # K/uL 0.5 0.2 - 0.9 THOU/uL    Eos # K/uL 0.4 0.0 - 0.4 THOU/uL    Baso # K/uL 0.0 0.0 - 0.1 THOU/uL    Nucl RBC % 0.0 0.0 - 0.2 /100 WBC    Nucl RBC # K/uL 0.0 0.0 - 0.0 THOU/uL    IMM Granulocytes # 0.0 0.0 - 0.0 THOU/uL    IMM Granulocytes 0.5 %   Neutrophil #-Instrument    Collection Time: 11/20/18  9:42 AM   Result Value Ref Range    Neutrophil #-Instrument 3.4 THOU/uL       Assessment Plan:  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 She is now +3 yrs, 6 mo post transplant.     Atypical MPN S/P URD allo, now at 3 yrs 6 months  Day 30 marrow (6/6) hypercellular marrow, no evidence of malignancy, chimerism 2% recipient  Day 100 BMbx (09/15/15) Normochromic  normocytic anemia. Mild lymphopenia. Mildly hypercellular bone marrow with trilineage hematopoiesis. Chimerism 2% recipient.  1 year marrow with normocellularity and 100% donor chimerism   Blood counts remain stable without any signs of relapse    GVHD  Full dose MTX given day 1,3,6. Day 11 mtx held due to elevated bilirubin and mucositis  Off tacrolimus  Prior elevation in LFTs have resolved. Dry mouth improved.   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     ID  Hx Cholecystitis, pneumonia 05/2015  CMV PCR not required both negative   Will continue acyclovir for now.    Completing vaccines. She has MMR left (June, 2020).  Gets flu shot every autumn-planning to go within the next week or so    CV/Pulm  Hx A-flutter  Cont Metoprolol 50 mg BID  Followed by Dr. Kathi Der, Cardiology  Off cardizem, stopped by Dr. Kathi Der  Possible OSA    Acute KI on CKD r/t tacrolimus- resolving  Creatinine pending  Encouraged PO intake    GI  Cont protonix; especially as she is using NSAIDs occasionaly;   Zofran prn     MSK  Pain from elbow to wrist; with tingling of right middle fingers; has wrist splint to use.   Follows with orthopedics     Derm--hs intertriginous rash--Will be seeing a dermatologist and has topical treatments to use.     The plan was discussed with the patient and the patient/patient rep demonstrated understanding to the provider's satisfaction.    Consent was previously obtained from the patient to complete this telephone consult; including the potential for financial liability.    21+ minutes were spent on the phone with the patient, patient representatives, and/or other attendees.     Barrett Henle, PA

## 2018-11-25 ENCOUNTER — Telehealth: Payer: Self-pay | Admitting: Hematology

## 2018-11-25 NOTE — Telephone Encounter (Signed)
Spoke to patient   She wanted to know what her labs from last week are   Reviewed together   No further questions   She states understanding

## 2018-11-28 ENCOUNTER — Telehealth: Payer: No Typology Code available for payment source | Admitting: Physician Assistant

## 2019-03-09 ENCOUNTER — Other Ambulatory Visit: Payer: Self-pay

## 2019-03-09 DIAGNOSIS — Z9481 Bone marrow transplant status: Secondary | ICD-10-CM

## 2019-03-09 DIAGNOSIS — K219 Gastro-esophageal reflux disease without esophagitis: Secondary | ICD-10-CM

## 2019-03-09 MED ORDER — PANTOPRAZOLE SODIUM 40 MG PO TBEC *I*
DELAYED_RELEASE_TABLET | ORAL | 3 refills | Status: DC
Start: 2019-03-09 — End: 2020-06-15

## 2019-03-11 ENCOUNTER — Telehealth: Payer: Self-pay | Admitting: Hematology

## 2019-03-11 NOTE — Telephone Encounter (Signed)
Patient is in [place] script was transferred by pharmacy staff/

## 2019-04-01 ENCOUNTER — Telehealth: Payer: Self-pay | Admitting: Hematology

## 2019-04-01 DIAGNOSIS — R110 Nausea: Secondary | ICD-10-CM

## 2019-04-01 DIAGNOSIS — Z9481 Bone marrow transplant status: Secondary | ICD-10-CM

## 2019-04-01 MED ORDER — ONDANSETRON HCL 4 MG PO TABS *I*
4.0000 mg | ORAL_TABLET | Freq: Three times a day (TID) | ORAL | 3 refills | Status: DC | PRN
Start: 2019-04-01 — End: 2019-12-11

## 2019-04-01 MED ORDER — ONDANSETRON HCL 4 MG PO TABS *I*
4.0000 mg | ORAL_TABLET | Freq: Three times a day (TID) | ORAL | 3 refills | Status: DC | PRN
Start: 2019-04-01 — End: 2019-04-01

## 2019-04-05 ENCOUNTER — Other Ambulatory Visit: Payer: Self-pay | Admitting: Pulmonary Disease

## 2019-04-05 DIAGNOSIS — Z23 Encounter for immunization: Secondary | ICD-10-CM

## 2019-04-16 ENCOUNTER — Other Ambulatory Visit: Payer: Self-pay

## 2019-04-16 DIAGNOSIS — Z9481 Bone marrow transplant status: Secondary | ICD-10-CM

## 2019-04-16 MED ORDER — METOPROLOL TARTRATE 50 MG PO TABS *I*
50.0000 mg | ORAL_TABLET | Freq: Two times a day (BID) | ORAL | 3 refills | Status: DC
Start: 2019-04-16 — End: 2019-08-12

## 2019-05-18 ENCOUNTER — Telehealth: Payer: Self-pay | Admitting: Hematology

## 2019-05-18 ENCOUNTER — Telehealth: Payer: Self-pay

## 2019-05-18 NOTE — Telephone Encounter (Signed)
Returned patient's call regarding 5/13 appointment. Let patient know that unfortunately [name] did not have any earlier appointments that day; re-scheduled patient for 6/10 at 3 PM. Patient is aware of re-scheduled appointments.

## 2019-05-21 ENCOUNTER — Ambulatory Visit: Payer: No Typology Code available for payment source | Admitting: Hematology

## 2019-06-10 ENCOUNTER — Telehealth: Payer: Self-pay | Admitting: Hematology

## 2019-06-10 DIAGNOSIS — Z9481 Bone marrow transplant status: Secondary | ICD-10-CM

## 2019-06-10 DIAGNOSIS — C9311 Chronic myelomonocytic leukemia, in remission: Secondary | ICD-10-CM

## 2019-06-10 DIAGNOSIS — D471 Chronic myeloproliferative disease: Secondary | ICD-10-CM

## 2019-06-10 NOTE — Telephone Encounter (Signed)
Labs for next appt entered as well as yearly labs, patient aware to fast.

## 2019-06-11 ENCOUNTER — Ambulatory Visit: Payer: No Typology Code available for payment source | Admitting: Orthopedic Surgery

## 2019-06-18 ENCOUNTER — Ambulatory Visit: Payer: No Typology Code available for payment source | Attending: Hematology | Admitting: Hematology

## 2019-06-18 VITALS — BP 140/77 | HR 68 | Temp 96.4°F | Resp 17 | Ht 65.98 in | Wt 250.4 lb

## 2019-06-18 DIAGNOSIS — D471 Chronic myeloproliferative disease: Secondary | ICD-10-CM | POA: Insufficient documentation

## 2019-06-18 NOTE — Progress Notes (Signed)
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    The patient came to clinic today for routine follow up    PMH & Problem List  Reviewed in EPIC    Interval History/Review of Systems  [place] presents today for followup. Her last couple visits with [place] have been by telemedicien. Overall she admits that she is quite depressed based on having to place her father in a nursing home yesterday due to dementia  She and her mother tried caring for him at home but that was not possible  Also, her mother had a long recovery after a hip fracture. Joleena herself  still has hip and back pain. This is in the tailbone, the pryiformis and in the gluteal area almost at the sciatic notch.  It is hard to get comfortable while she sleeps.  She will see orthopedics about a joint injection next week.   She has tried to lose weight but she has not had success during COVID.  She has lost about 20 pounds over 1 year in any case.   Otherwise she denies any fevers, chills, chest pain, or abdominal pain. She is having regular BMs. No changes in vision, mouth pain/sores or new rashes.  She has had the COVID vaccine.   []  fevers  []  palpitations []  urinary frequency   []  chills []  chest pain []  dysuria    []  dry mouth []  nausea/emesis  []  rash   []  ear pain []  abdominal cramping []  skin changes   []  dry mouth []  abdominal discomfort []  pain   []  sore throat []  loose stool, non-formed [x]  fatigue   []  cough  []  diarrhea, watery []  enlarged lymph nodes   []  sputum production  []  weight loss [x]  Joint pains   []  shortness of breath []  insufficient oral intake []  none of the above     Medications  Reviewed medications with patient today & electronically reconciled  Current Outpatient Medications on File Prior to Visit   Medication Sig Dispense Refill    metoprolol tartrate (LOPRESSOR) 50 MG tablet Take 1 tablet (50 mg total) by mouth 2 times daily 120 tablet  3    ondansetron (ZOFRAN) 4 MG tablet Take 1 tablet (4 mg total) by mouth 3 times daily as needed (nausea) 60 tablet 3    pantoprazole (PROTONIX) 40 MG EC tablet TAKE 1 TABLET (40 MG TOTAL) BY MOUTH 2 TIMES DAILY SWALLOW WHOLE. DO NOT CRUSH, BREAK, OR CHEW. 180 tablet 3    cholecalciferol (VITAMIN D) 50 MCG (2000 UT) tablet Take 1,000 Units by mouth daily      acyclovir (ZOVIRAX) 400 MG tablet Take 1 tablet (400 mg total) by mouth 2 times daily 180 tablet 3    clotrimazole (LOTRIMIN) 1 % cream Apply topically 2 times daily 15 g 6    escitalopram (LEXAPRO) 20 MG tablet Take 20 mg by mouth daily  5    aspirin 81 MG EC tablet Take 81 mg by mouth daily      meloxicam (MOBIC) 15 MG tablet Take 1 tablet (15 mg total) by mouth daily Take with food. (Patient not taking: Reported on 06/18/2019) 30 tablet 1    levothyroxine (SYNTHROID, LEVOTHROID) 50 MCG tablet Take 50 mcg by mouth daily (before breakfast)      traMADol (ULTRAM) 50 MG tablet Take 1 tablet (50 mg total) by mouth every 6 hours as needed for Pain  Max daily dose: 200 mg (Patient not taking: Reported on 11/20/2018) 60 tablet 0    prochlorperazine (COMPAZINE) 10 MG tablet Take 1 tablet (10 mg total) by mouth 4 times daily as needed for Nausea (Patient not taking: Reported on 06/18/2019) 40 tablet 6     No current facility-administered medications on file prior to visit.     VS, weight, KPS  BP 140/77 (BP Location: Left arm, Patient Position: Sitting, Cuff Size: adult)    Pulse 68    Temp 35.8 C (96.4 F) (Temporal)    Resp 17    Ht 167.6 cm (5' 5.98")    Wt 113.6 kg (250 lb 7.1 oz)    SpO2 98%    BMI 40.44 kg/m     Wt Readings from Last 3 Encounters:   06/18/19 113.6 kg (250 lb 7.1 oz)   03/03/18 125.6 kg (277 lb)   11/14/17 126 kg (277 lb 12.5 oz)       KPS%: 80    Physical Exam   General Appearance:  Mental status  NAD  Looks well but unable to sit in one position for long.    Head:  Normocephalic, atraumatic   Eyes:  Conjunctivae/corneas clear, no  eye drainage   Oral/Throat: MMM   Neck: Supple, normal turgor; no adenopathy.    Heart: S1, S2 normal, regular rhythm, no murmur, rub or gallop   Lungs:   Respirations unlabored, clear to A&P auscultation bilaterally   Abdomen:   Soft, non-tender, bowel sounds active all four quadrants, tympanic, no masses, no organomegaly   Extremities: No edema   Pulses: Not checked.    Skin: No rash.      No results found for this or any previous visit (from the past 24 hour(s)).  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  Assessment and Plan  MARIBELLE HOPPLE is a 59 yo female with unclassifiable myeloproliferative neoplasm and completed Decitabine 74m/m2 IV daily x 5 days - Cycle # 1 12/15/14 and had been on Hydrea prior to transplant. 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 She is now over 4 years from transplant.     Atypical MPN S/P URD allo, now at 23 months.   Day 30 marrow (6/6) hypercellular marrow, no evidence of malignancy, chimerism 2% recipient  Day 100 BMbx (09/15/15) Normochromic normocytic anemia. Mild lymphopenia. Mildly hypercellular bone marrow with trilineage hematopoiesis. Chimerism 2% recipient.  1 year marrow with normocellularity and 100% donor chimerism   Blood counts remain stable without any signs of relapse; await lab draw on 6/10.     GVHD  Full dose MTX given day 1,3,6. Day 11 mtx held due to elevated bilirubin and mucositis  Off tacrolimus  . Dry mouth improved.   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   Will follow LFTs.     ID  Hx Cholecystitis, pneumonia 05/2015  CMV PCR not required;  both negative   Will continue acyclovir for now.    Completed vaccines.  She has MMR left (June, 2020).  Had flu shot and COVID vaccine .    CV/Pulm  Hx A-flutter  Cont Metoprolol 50 mg BID  Followed by Dr. MKathi Der Cardiology  Off cardizem, stopped by Dr.  MKathi Der   Acute KI on CKD r/t tacrolimus- resolving  Creatinine stable  Encouraged PO intake  She takes a lot  of ibuprofen, so will have to watch renal function closely.     GI  Cont protonix; especially as she is using NSAIDs but not as much as in past. .   Zofran prn     MSK  Pain from elbow to wrist; with tingling of right middle fingers; has wrist splint to use.   Pain in bilateral knees with shooting sensation when attempting to stand-and bilateral hip pain; No AVN on hip films; still has pain over right sciatic notch. This has become severe and disabling to her.  Follows with orthopedics and has obtained injections;     Smoking cessation   Has been able to abstain thus far    Depression--somewhat situational    Derm--hs intertriginous rash at times and has topcials for this--    Daily nausea--will ask her see GI for possible endospcopy to r/o ulcer, gastritis, UGI GVHD (doubt this far from transplant)     Follow up  BMT clinic:6 months  Will f/u on blood work from tomorrow.   Bloodwork frequency: labs prior      Cheron Schaumann, MD

## 2019-06-24 ENCOUNTER — Other Ambulatory Visit
Admission: RE | Admit: 2019-06-24 | Discharge: 2019-06-24 | Disposition: A | Discharge status: Home / Self Care | Payer: No Typology Code available for payment source | Source: Ambulatory Visit | Attending: Hematology | Admitting: Hematology

## 2019-06-24 DIAGNOSIS — D471 Chronic myeloproliferative disease: Secondary | ICD-10-CM | POA: Insufficient documentation

## 2019-06-24 DIAGNOSIS — Z9481 Bone marrow transplant status: Secondary | ICD-10-CM | POA: Insufficient documentation

## 2019-06-24 DIAGNOSIS — E119 Type 2 diabetes mellitus without complications: Secondary | ICD-10-CM | POA: Insufficient documentation

## 2019-06-24 DIAGNOSIS — C9311 Chronic myelomonocytic leukemia, in remission: Secondary | ICD-10-CM | POA: Insufficient documentation

## 2019-06-24 LAB — FOLLICLE STIMULATING HORMONE: FSH: 48 m[IU]/mL

## 2019-06-24 LAB — COMPREHENSIVE METABOLIC PANEL
ALT: 24 U/L (ref 0–35)
AST: 20 U/L (ref 0–35)
Albumin: 4 g/dL (ref 3.5–5.2)
Alk Phos: 99 U/L (ref 35–105)
Anion Gap: 8 (ref 7–16)
Bilirubin,Total: 0.2 mg/dL (ref 0.0–1.2)
CO2: 29 mmol/L — ABNORMAL HIGH (ref 20–28)
Calcium: 9.7 mg/dL (ref 8.6–10.2)
Chloride: 102 mmol/L (ref 96–108)
Creatinine: 0.89 mg/dL (ref 0.51–0.95)
GFR,Black: 82 *
GFR,Caucasian: 71 *
Glucose: 118 mg/dL — ABNORMAL HIGH (ref 60–99)
Lab: 19 mg/dL (ref 6–20)
Potassium: 5.3 mmol/L — ABNORMAL HIGH (ref 3.3–5.1)
Sodium: 139 mmol/L (ref 133–145)
Total Protein: 6.5 g/dL (ref 6.3–7.7)

## 2019-06-24 LAB — CBC AND DIFFERENTIAL
Baso # K/uL: 0.1 10*3/uL (ref 0.0–0.1)
Basophil %: 0.7 %
Eos # K/uL: 0.8 10*3/uL — ABNORMAL HIGH (ref 0.0–0.4)
Eosinophil %: 10.9 %
Hematocrit: 43 % (ref 34–45)
Hemoglobin: 13.3 g/dL (ref 11.2–15.7)
IMM Granulocytes #: 0 10*3/uL (ref 0.0–0.0)
IMM Granulocytes: 0.6 %
Lymph # K/uL: 2.1 10*3/uL (ref 1.2–3.7)
Lymphocyte %: 28.9 %
MCH: 30 pg (ref 26–32)
MCHC: 31 g/dL — ABNORMAL LOW (ref 32–36)
MCV: 96 fL — ABNORMAL HIGH (ref 79–95)
Mono # K/uL: 0.6 10*3/uL (ref 0.2–0.9)
Monocyte %: 7.9 %
Neut # K/uL: 3.7 10*3/uL (ref 1.6–6.1)
Nucl RBC # K/uL: 0 10*3/uL (ref 0.0–0.0)
Nucl RBC %: 0 /100 WBC (ref 0.0–0.2)
Platelets: 208 10*3/uL (ref 160–370)
RBC: 4.5 MIL/uL (ref 3.9–5.2)
RDW: 13.9 % (ref 11.7–14.4)
Seg Neut %: 51 %
WBC: 7.3 10*3/uL (ref 4.0–10.0)

## 2019-06-24 LAB — LIPID PANEL
Chol/HDL Ratio: 2.9
Cholesterol: 180 mg/dL
HDL: 62 mg/dL — ABNORMAL HIGH (ref 40–60)
LDL Calculated: 101 mg/dL
Non HDL Cholesterol: 118 mg/dL
Triglycerides: 85 mg/dL

## 2019-06-24 LAB — MULTIPLE ORDERING DOCS

## 2019-06-24 LAB — LUTEINIZING HORMONE: LH: 24.3 m[IU]/mL

## 2019-06-24 LAB — TSH: TSH: 3.23 u[IU]/mL (ref 0.27–4.20)

## 2019-06-24 LAB — NEUTROPHIL #-INSTRUMENT: Neutrophil #-Instrument: 3.7 10*3/uL

## 2019-06-24 LAB — TESTOSTERONE: Testosterone: 24 ng/dL (ref 3–41)

## 2019-06-24 LAB — LACTATE DEHYDROGENASE: LD: 147 U/L (ref 118–225)

## 2019-06-25 ENCOUNTER — Ambulatory Visit: Payer: No Typology Code available for payment source | Admitting: Orthopedic Surgery

## 2019-06-25 ENCOUNTER — Other Ambulatory Visit
Admission: RE | Admit: 2019-06-25 | Discharge: 2019-06-25 | Disposition: A | Discharge status: Home / Self Care | Payer: No Typology Code available for payment source | Source: Ambulatory Visit | Attending: Hematology | Admitting: Hematology

## 2019-06-25 ENCOUNTER — Encounter: Payer: Self-pay | Admitting: Orthopedic Surgery

## 2019-06-25 VITALS — BP 145/68 | Ht 66.0 in | Wt 248.0 lb

## 2019-06-25 DIAGNOSIS — D471 Chronic myeloproliferative disease: Secondary | ICD-10-CM | POA: Insufficient documentation

## 2019-06-25 DIAGNOSIS — Z9481 Bone marrow transplant status: Secondary | ICD-10-CM | POA: Insufficient documentation

## 2019-06-25 DIAGNOSIS — C9311 Chronic myelomonocytic leukemia, in remission: Secondary | ICD-10-CM | POA: Insufficient documentation

## 2019-06-25 DIAGNOSIS — M1612 Unilateral primary osteoarthritis, left hip: Secondary | ICD-10-CM

## 2019-06-25 DIAGNOSIS — M7061 Trochanteric bursitis, right hip: Secondary | ICD-10-CM

## 2019-06-25 DIAGNOSIS — M1610 Unilateral primary osteoarthritis, unspecified hip: Secondary | ICD-10-CM

## 2019-06-25 LAB — CHIMERISM: Percent Recipient: 0

## 2019-06-25 LAB — HEMOGLOBIN A1C: Hemoglobin A1C: 5.8 % — ABNORMAL HIGH

## 2019-06-25 LAB — CHIM REVIEW

## 2019-06-25 MED ORDER — LIDOCAINE HCL 1 % IJ SOLN *I*
5.0000 mL | Freq: Once | INTRAMUSCULAR | Status: AC | PRN
Start: 2019-06-25 — End: 2019-06-25
  Administered 2019-06-25: 08:00:00 5 mL via INTRA_ARTICULAR

## 2019-06-25 MED ORDER — ETHYL CHLORIDE EX AERO (MULTIPLE PATIENTS) *I*
1.0000 | INHALATION_SPRAY | Freq: Once | CUTANEOUS | Status: AC | PRN
Start: 2019-06-25 — End: 2019-06-25
  Administered 2019-06-25: 08:00:00 1 via TOPICAL

## 2019-06-25 MED ORDER — LIDOCAINE HCL 1 % IJ SOLN *I*
1.5000 mL | Freq: Once | INTRAMUSCULAR | Status: AC | PRN
Start: 2019-06-25 — End: 2019-06-25
  Administered 2019-06-25: 08:00:00 1.5 mL via INTRA_ARTICULAR

## 2019-06-25 MED ORDER — LIDOCAINE HCL 1 % IJ SOLN *I*
0.5000 mL | Freq: Once | INTRAMUSCULAR | Status: AC | PRN
Start: 2019-06-25 — End: 2019-06-25
  Administered 2019-06-25: 08:00:00 .5 mL via INTRA_ARTICULAR

## 2019-06-25 MED ORDER — LIDOCAINE HCL 1 % IJ SOLN *I*
1.0000 mL | Freq: Once | INTRAMUSCULAR | Status: AC | PRN
Start: 2019-06-25 — End: 2019-06-25
  Administered 2019-06-25: 08:00:00 1 mL via INTRA_ARTICULAR

## 2019-06-25 MED ORDER — BETAMETHASONE ACET & SOD PHOS 6 (3-3) MG/ML IJ SUSP *I*
12.0000 mg | Freq: Once | INTRAMUSCULAR | Status: AC | PRN
Start: 2019-06-25 — End: 2019-06-25
  Administered 2019-06-25: 08:00:00 12 mg via INTRA_ARTICULAR

## 2019-06-25 NOTE — Procedures (Signed)
Large Joint Aspiration/Injection Procedure: R greater trochanteric bursa    Date/Time: 06/25/2019  8:00 AM EDT  Consent given by: patient  Site marked: site marked  Timeout: Immediately prior to procedure a time out was called to verify the correct patient, procedure, equipment, support staff and site/side marked as required     Procedure Details    Location: hip - R greater trochanteric bursa  Preparation: The site was prepped using the usual aseptic technique.  Anesthetics administered: 5 mL lidocaine hcl 1 %; 1 mL lidocaine hcl 1 %; 1 spray ethyl chloride; 1.5 mL lidocaine hcl 1 %; 0.5 mL lidocaine hcl 1 %  Intra-Articular Steroids administered: 12 mg betamethasone acetate & sodium phosphate 6 (3-3) MG/ML  Dressing:  A dry, sterile dressing was applied.  Patient tolerance: patient tolerated the procedure well with no immediate complications

## 2019-06-25 NOTE — Patient Instructions (Signed)
Injection Instructions    · The injection you received today may take 5-7 days to work in the case of cortisone or 3 weeks or more for hyaluronic acid viscosupplements (Synvisc, Hyalgan, Orthovisc or others).   · You may find the area that was injected to be more painful for 1 or 2 days after the injection, after the numbing medicine wears off.  This happens as the medicine is being absorbed in your body.  · Rest for 2-3 days, activities of daily living are fine.   · It may be helpful to put ice on the body part that was injected to ease the pain and rest the joint for today.  · You may also use pain medication - Tylenol, Advil/Motrin or Aleve as appropriate.  · A cortisone injection may cause systemic reactions, including flushing, feeling hot, irritability or inability to sleep.  · If you are diabetic, a cortisone injection can increase your blood sugar level for several days.  Monitor your glucose levels closely.  · Contact the office [phone] if you feel ill, have excessive pain, if the area turns red or you have a fever

## 2019-06-25 NOTE — Progress Notes (Signed)
Chief Complaint:  Hip pain.    History of Present Illness:  This is a 59 y.o. female who presents for follow up regarding her left > right hip pain. Last seen on 03/03/18 for this complaint. She received a right intra-articular hip injection with partial relief over 4-5 months.  Patient is now experiencing more left hip pain though she also has right hip.  She states her pain has started to change including more lateral symptoms and symptoms traveling down her leg to her knee.one of her more significant complaints is pain when trying to sleep on her sides at night.  Patient does not want to explore surgical options at this point in time.        Treatment History:    Physical therapy: too painful   Yes _0  No _1  Helpful _2  Somewhat helpful _3  Not helpful _4     Exercise: too painful  Yes _5  No _6  Helpful _7  Somewhat helpful _8  Not helpful _9     Activity modification:   Yes _10  No _11  Helpful _12  Somewhat helpful _13  Not helpful _14     Brace:  Yes _15   No _16   Helpful _17   Somewhat helpful _18   Not helpful _19      Acetaminophen:   Yes _20  No _21  Helpful _22  Somewhat helpful _23  Not helpful _24     NSAIDs:  Ibuprofen 861m daily   Yes _25  No _26  Helpful _27  Somewhat helpful _28  Not helpful _29      Topicals:  Yes _30   No _31   Helpful _32   Somewhat helpful _33   Not helpful _34      Other Medications: Lexapro  Yes _35   No _36   Helpful _37   Somewhat helpful _38   Not helpful _39      Alternative Treatments:  Yes _40   No _41   Helpful _42   Somewhat helpful _43   Not helpful _44      Corticosteroid injections:   Right intra-articular hip and greater trochanteric bursa  Yes _45  No _46  Helpful _47  Somewhat helpful _48  Not helpful _49     Viscosupplementation:   Yes _50  No _51  Helpful _52  Somewhat helpful _53  Not helpful _54     Radiofrequency Ablation:  Yes _55   No _56   Helpful _57   Somewhat helpful _58   Not helpful _59      Surgery:   Yes _60  No _61  Helpful _62  Somewhat helpful _63  Not helpful _64       Past Medical History:  Past Medical History:   Diagnosis Date    Atrial  fibrillation     fib/flutter 05/2015    CHF (congestive heart failure)     pulmonary edema 05/28/15    GERD (gastroesophageal reflux disease)     Hypoxia 06/11/2015    Impaired mobility and ADLs 06/11/2015    Leukocytosis 09/22/2013    post allogenic MUD (F)  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    Conditioned w/ Busulfan 130 mg/m2/d and Fludarabine 40 mg/m2/d x4 days followed by 10/10 HLA MUD (F) Allogeneic PBSCT (pt/donor: ABO: A+/A+, CMV N/N)    Pulmonary edema     Shortness of breath        Past Surgical History:  Past Surgical History:   Procedure Laterality Date    ROTATOR CUFF REPAIR Right        Social History:  Social History     Socioeconomic History    Marital status: Divorced     Spouse name: Not on file    Number of children: Not on file    Years of education: Not  on file    Highest education level: Not on file   Occupational History    Not on file   Tobacco Use    Smoking status: Former Smoker     Packs/day: 0.50     Years: 30.00     Pack years: 15.00     Quit date: 05/11/2015     Years since quitting: 4.1    Smokeless tobacco: Never Used    Tobacco comment: nicoderm patches started 05/11/15   Substance and Sexual Activity    Alcohol use: No     Comment: rare     Drug use: No    Sexual activity: Not Currently   Social History Narrative    Not on file         Family History:  Family History   Problem Relation Age of Onset    No Known Problems Mother     No Known Problems Father     No Known Problems Sister     No Known Problems Brother     Breast cancer Paternal Aunt        Review of Systems:  A complete 12 point review of systems is negative except as in HPI and listed below:  Bilateral Hip pain      Medications:  Current Outpatient Medications on File Prior to Visit   Medication Sig Dispense Refill    metoprolol tartrate (LOPRESSOR) 50 MG tablet Take 1 tablet (50 mg total) by mouth 2 times daily 120 tablet 3    ondansetron (ZOFRAN) 4 MG tablet Take 1 tablet (4 mg total) by mouth 3 times  daily as needed (nausea) 60 tablet 3    pantoprazole (PROTONIX) 40 MG EC tablet TAKE 1 TABLET (40 MG TOTAL) BY MOUTH 2 TIMES DAILY SWALLOW WHOLE. DO NOT CRUSH, BREAK, OR CHEW. 180 tablet 3    cholecalciferol (VITAMIN D) 50 MCG (2000 UT) tablet Take 1,000 Units by mouth daily      acyclovir (ZOVIRAX) 400 MG tablet Take 1 tablet (400 mg total) by mouth 2 times daily 180 tablet 3    meloxicam (MOBIC) 15 MG tablet Take 1 tablet (15 mg total) by mouth daily Take with food. (Patient not taking: Reported on 06/18/2019) 30 tablet 1    levothyroxine (SYNTHROID, LEVOTHROID) 50 MCG tablet Take 50 mcg by mouth daily (before breakfast)      clotrimazole (LOTRIMIN) 1 % cream Apply topically 2 times daily 15 g 6    traMADol (ULTRAM) 50 MG tablet Take 1 tablet (50 mg total) by mouth every 6 hours as needed for Pain   Max daily dose: 200 mg (Patient not taking: Reported on 11/20/2018) 60 tablet 0    escitalopram (LEXAPRO) 20 MG tablet Take 20 mg by mouth daily  5    aspirin 81 MG EC tablet Take 81 mg by mouth daily      prochlorperazine (COMPAZINE) 10 MG tablet Take 1 tablet (10 mg total) by mouth 4 times daily as needed for Nausea (Patient not taking: Reported on 06/18/2019) 40 tablet 6     No current facility-administered medications on file prior to visit.       Allergies:  No Known Allergies (drug, envir, food or latex)      Physical Exam:  Pain    06/25/19 0749   PainSc:  10 - Worst pain ever     Blood pressure 145/68, height 1.676 m ('5\' 6"'$ ), weight 112.5 kg (248 lb).  Taken by patient care tech  General: NAD, BMI 40   Pulm: normal work of breathing   Abd: benign   Skin: No rashes   Pysch: Normal affect, current stressors for her father is hospitalized  MSK/neuro:     Hip/ Low back exam:  Inspection reveals no obvious lumbosacral spinal deformity.  No scars, rashes or other skin lesions.  No obvious hip deformity.  No scars, rashes or other skin lesions.  No significant atrophy of thighs and calves bilaterally.   ROM-  Hip IR bilaterally is reduced with concordant pain,  ER limited with moderate pain  ROM- Lumbar flexion is achieved to the ankles without pain. Lumbar extension is full without pain.  Significant bilateral trochanteric tenderness and along IT band  Strength 5/5 bilateral lower extremities.  Sensation is intact to light touch in bilateral lower extremities.  Straight leg raise negative.   FABER test positive.  Logroll positive    Assessment:  This is a 59 y.o. female with chronic bilateral hip pain secondary to hip arthritis and gluteal tendinopathy.  As her symptoms have started to change and now bother her more so along the leg and when sleeping we will trial trochanteric injections today.  If there is incomplete relief she will return in 3-4 weeks for bilateral ultrasound-guided intra-articular injections.    Plan:  -b/l GT injections performed today, see separate procedure notes  -Discussed surgery and patient is not interested in surgical correction at this time  -Resume HEP after 2-3 days rest  -Follow-up: 3-4 weeks for bilateral intra-articular hip injections    Thank you for allowing [place] to take part in this patient's care and contact us if you have any questions.

## 2019-06-25 NOTE — Procedures (Signed)
Large Joint Aspiration/Injection Procedure: L greater trochanteric bursa    Date/Time: 06/25/2019  8:00 AM EDT  Consent given by: patient  Site marked: site marked  Timeout: Immediately prior to procedure a time out was called to verify the correct patient, procedure, equipment, support staff and site/side marked as required     Procedure Details    Location: hip - L greater trochanteric bursa  Preparation: The site was prepped using the usual aseptic technique.  Anesthetics administered: 5 mL lidocaine hcl 1 %; 1 mL lidocaine hcl 1 %; 1 spray ethyl chloride; 1.5 mL lidocaine hcl 1 %; 0.5 mL lidocaine hcl 1 %  Intra-Articular Steroids administered: 12 mg betamethasone acetate & sodium phosphate 6 (3-3) MG/ML  Dressing:  A dry, sterile dressing was applied.  Patient tolerance: patient tolerated the procedure well with no immediate complications

## 2019-08-04 ENCOUNTER — Ambulatory Visit: Payer: No Typology Code available for payment source | Admitting: Sports Medicine

## 2019-08-12 ENCOUNTER — Telehealth: Payer: Self-pay | Admitting: Hematology

## 2019-08-12 DIAGNOSIS — Z9481 Bone marrow transplant status: Secondary | ICD-10-CM

## 2019-08-12 MED ORDER — METOPROLOL TARTRATE 50 MG PO TABS *I*
50.0000 mg | ORAL_TABLET | Freq: Two times a day (BID) | ORAL | 3 refills | Status: DC
Start: 2019-08-12 — End: 2019-10-16

## 2019-08-12 NOTE — Telephone Encounter (Signed)
Refill sent.

## 2019-08-16 ENCOUNTER — Other Ambulatory Visit: Payer: Self-pay | Admitting: Hematology

## 2019-08-16 DIAGNOSIS — Z9481 Bone marrow transplant status: Secondary | ICD-10-CM

## 2019-08-31 ENCOUNTER — Ambulatory Visit: Payer: Medicare Other | Admitting: Sports Medicine

## 2019-08-31 ENCOUNTER — Encounter: Payer: Self-pay | Admitting: Sports Medicine

## 2019-08-31 VITALS — BP 118/76 | Ht 66.0 in | Wt 248.0 lb

## 2019-08-31 DIAGNOSIS — M1611 Unilateral primary osteoarthritis, right hip: Secondary | ICD-10-CM

## 2019-08-31 DIAGNOSIS — M1612 Unilateral primary osteoarthritis, left hip: Secondary | ICD-10-CM

## 2019-08-31 DIAGNOSIS — M1610 Unilateral primary osteoarthritis, unspecified hip: Secondary | ICD-10-CM

## 2019-08-31 DIAGNOSIS — M25551 Pain in right hip: Secondary | ICD-10-CM

## 2019-08-31 DIAGNOSIS — M25552 Pain in left hip: Secondary | ICD-10-CM

## 2019-08-31 MED ORDER — MELOXICAM 7.5 MG PO TABS *I*
7.5000 mg | ORAL_TABLET | Freq: Every day | ORAL | 0 refills | Status: DC
Start: 2019-08-31 — End: 2020-05-19

## 2019-08-31 NOTE — Progress Notes (Signed)
Chief Complaint:  Hip pain.    History of Present Illness:  This is a 59 y.o. female who presents for follow up regarding her left > right hip pain. Last seen on 06/25/19 for this complaint and received bilateral GT injections.  She reports improvement however this only last for a few days.  Patient returns today with bilateral groin pain and feels the intra-articular injections are more beneficial.  Patient does not want to explore surgical options at this point in time.        Treatment History:  Physical therapy: too painful   Yes _0  No _1  Helpful _2  Somewhat helpful _3  Not helpful _4     Exercise: too painful  Yes _5  No _6  Helpful _7  Somewhat helpful _8  Not helpful _9     Activity modification:   Yes _10  No _11  Helpful _12  Somewhat helpful _13  Not helpful _14     Brace:  Yes _15   No _16   Helpful _17   Somewhat helpful _18   Not helpful _19      Acetaminophen:   Yes _20  No _21  Helpful _22  Somewhat helpful _23  Not helpful _24     NSAIDs:  Ibuprofen 868m daily   Yes _25  No _26  Helpful _27  Somewhat helpful _28  Not helpful _29      Topicals:  Yes _30   No _31   Helpful _32   Somewhat helpful _33   Not helpful _34      Other Medications: Lexapro  Yes _35   No _36   Helpful _37   Somewhat helpful _38   Not helpful _39      Alternative Treatments:  Yes _40   No _41   Helpful _42   Somewhat helpful _43   Not helpful _44      Corticosteroid injections:   Right intra-articular hip and greater trochanteric bursa, more relief with IA  Yes _45  No _46  Helpful _47  Somewhat helpful _48  Not helpful _49     Viscosupplementation:   Yes _50  No _51  Helpful _52  Somewhat helpful _53  Not helpful _54     Radiofrequency Ablation:  Yes _55   No _56   Helpful _57   Somewhat helpful _58   Not helpful _59      Surgery:   Yes _60  No _61  Helpful _62  Somewhat helpful _63  Not helpful _64       Past Medical History:  Past Medical History:   Diagnosis Date    Atrial fibrillation     fib/flutter 05/2015    CHF (congestive heart failure)     pulmonary edema 05/28/15    GERD (gastroesophageal reflux disease)      Hypoxia 06/11/2015    Impaired mobility and ADLs 06/11/2015    Leukocytosis 09/22/2013    post allogenic MUD (F)  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    Conditioned w/ Busulfan 130 mg/m2/d and Fludarabine 40 mg/m2/d x4 days followed by 10/10 HLA MUD (F) Allogeneic PBSCT (pt/donor: ABO: A+/A+, CMV N/N)    Pulmonary edema     Shortness of breath        Past Surgical History:  Past Surgical History:   Procedure Laterality Date    ROTATOR CUFF REPAIR Right        Social History:  Social History     Socioeconomic History    Marital status: Divorced     Spouse name: Not on file    Number of children: Not on file    Years of education: Not on file    Highest education level: Not on file   Occupational History    Not on file   Tobacco Use    Smoking status: Former  Smoker     Packs/day: 0.50     Years: 30.00     Pack years: 15.00     Quit date: 05/11/2015     Years since quitting: 4.3    Smokeless tobacco: Never Used    Tobacco comment: nicoderm patches started 05/11/15   Substance and Sexual Activity    Alcohol use: No     Comment: rare     Drug use: No    Sexual activity: Not Currently   Social History Narrative    Not on file         Family History:  Family History   Problem Relation Age of Onset    No Known Problems Mother     No Known Problems Father     No Known Problems Sister     No Known Problems Brother     Breast cancer Paternal Aunt        Review of Systems:  A complete 12 point review of systems is negative except as in HPI and listed below:  Bilateral Hip pain      Evaluation to date:  MRI L hip 12/14/16  1. Moderate degenerative disease of the left hip with associated acetabular cartilage loss, degenerative tearing of the labrum and a para labral cyst posterosuperiorly.      2. Mild soft tissue inflammation superficial to the greater trochanter without organized fluid collection. Moderate tendinopathy of the gluteus minimus tendon at the insertion on the greater trochanter. The findings may  correlate with symptoms of greater   trochanteric bursitis.      3. No avascular necrosis or insufficiency fracture. No joint effusion.      END REPORT     MRI R hip 12/14/16  1. Moderate degenerative disease of the right hip with a focal area of chondral loss involving the anterosuperior acetabulum and subchondral cysts.      2. Mild soft tissue inflammation superficial to the greater trochanter without organized fluid collection. Mild tendinopathy of the gluteus medius and minimus tendons at the insertion on the greater trochanter. The findings may correlate with symptoms of   greater trochanteric bursitis.      3. No avascular necrosis or insufficiency fracture. No joint effusion.      END REPORT         Physical Exam:  Pain    08/31/19 1116   PainSc:   8   PainLoc: Hip     Blood pressure 118/76, height 1.676 m (_0 ), weight 112.5 kg (248 lb).  Taken by patient care tech    General: NAD, BMI 40   Pulm: normal work of breathing   Abd: benign   Skin: No rashes   Pysch: Normal affect, current stressors for her father is hospitalized  MSK/neuro:     Hip/ Low back exam:  Inspection reveals no obvious lumbosacral spinal deformity.  No scars, rashes or other skin lesions.  Increased pain when rising from flexion.   No obvious hip deformity.  No scars, rashes or other skin lesions.  No significant atrophy of thighs and calves bilaterally.   ROM- Hip IR bilaterally is reduced with concordant pain,  ER limited with moderate pain  Mild bilateral trochanteric tenderness and along IT band  Strength 5/5 bilateral lower extremities  Sensation is intact to light touch in bilateral lower extremities.    Straight leg raise negative.   FABER test positive.  Logroll positive.    Assessment:  This is a 59 y.o. female  with chronic bilateral hip pain secondary to hip arthritis and gluteal tendinopathy.  Patient is here today interested in bilateral ultrasound-guided intra-articular injections.    Plan:  -b/l USG intra-articular  injections performed today, see separate procedure notes  -Short course of Meloxicam refilled.  Discussed prolonged use and further refills would require follow up with PCP  -Discussed surgery and patient is not interested in surgical correction at this time, reviewed further weight loss  -Resume HEP after 2-3 days rest  -Follow-up: as needed    Questions solicited and answered.  Thank you for allowing [place] to take part in this patient's care and contact us if you have any questions.

## 2019-08-31 NOTE — Patient Instructions (Addendum)
Injection Instructions    · The injection you received today may take 5-7 days to work in the case of cortisone or 3 weeks or more for hyaluronic acid viscosupplements (Synvics, Hyalgan, Orthovisc or others).   · You may find the area that was injected to be more painful for 1 or 2 days after the injection.  This happens as the medicine is being absorbed in your body.  · It may be helpful to put ice on the body part that was injected to ease the pain.  · You may also use pain medication - Tylenol, Advil/Motrin or Aleve as appropriate.  · A cortisone injection may cause systemic reactions, including flushing, feeling hot, irritability or inability to sleep  · If you are diabetic, a cortisone injection can increase your blood sugar level for several days.  Monitor your glucose levels closely.  · Contact the office [phone] if you feel ill, have excessive pain, if the area turns red or you have a fever      [facility] is pleased to provide care for your concerns today. We take pride in careful listening, thorough diagnosis and testing, and providing the most up to date and appropriate treatment for your condition. We have a particular focus on medical and nonsurgical care of musculoskeletal problems for active people of all ages and backgrounds. Of course, if your condition requires surgery, we will facilitate your prompt consultation with one of our board certified surgeons.     We care about you and your experience as our patient. We understand that injuries and illnesses can be very stressful, particularly during these times, whether you see us in person or via Zoom. While not all problems are curable, we aim to help you reach the best possible outcome. We recognize that your time is valuable and that it is difficult to be in pain. We see both urgent and chronic problems each day, which can influence your wait time both to be scheduled or to be seen on the day  of your visit. We always try to accommodate patients as quickly as possible and return you to your activities as soon as it is safe. As an academic practice, we teach the next generation of medical providers and so it is common for you to see several health care providers during your visit.    We are dedicated to providing our patients with outstanding medical service. We hope that you have been completely satisfied with our efforts. Just as we use lab tests and X-rays to get a clearer picture of how we can improve your medical condition, we use the [employer] Patient Experience Survey to learn how we can improve your health care experience. You may be receiving this survey in the mail soon. [employer] is a patient experience company based in [place], [place]. We partner with them to improve our patient experience. Since this is an independent company, there is no chance for bias in the survey questions or reported results. For more than 30 years, [employer] has been the industry’s recognized leader in improving the patient experience. Today, [employer] works with more than 26,000 health care facilities, including over 60 percent of all [place] hospitals, to help reduce patient suffering and improve the quality, safety, and experience of care. Our clinician ratings are important to us. Please consider completing this survey with your positive experiences when it arrives. However, if there was anything about your visit that   fell short of your expectations, please send us a Mychart message with your concerns first before completing the survey so that we can further address or explain them.  Please consider recommending us to others. Word of mouth is one of our most valued sources of referrals. You can tell your primary care provider, family and friends, or other referring health care provider about the quality of your experience. You may also wish to let others know about your positive experiences via Healthgrades,  Vitals, Yelp, Google, Facebook or other internet forums.     If you feel that anyone involved in your care today has gone above and beyond in delivering extraordinary medical service, please consider nominating them for a [facility] star:  [email or web address]    We thank you for your trust in [facility].

## 2019-08-31 NOTE — Procedures (Signed)
Large Joint Aspiration/Injection Procedure: L hip joint    Date/Time: 08/31/2019 11:15 AM EDT  Consent given by: patient  Site marked: site marked  Timeout: Immediately prior to procedure a time out was called to verify the correct patient, procedure, equipment, support staff and site/side marked as required     Procedure Details    Location: hip - L hip joint  Preparation: The site was prepped using the usual aseptic technique.  Ultrasound guidance:  Ultrasound was utilized to improve needle visualization, injection accuracy, and anatomic localization.    Anesthetics administered: 5 mL lidocaine hcl 1 %  Intra-Articular Steroids administered: 12 mg betamethasone acetate & sodium phosphate 6 (3-3) MG/ML  Dressing:  A dry, sterile dressing was applied.  Patient tolerance: patient tolerated the procedure well with no immediate complications

## 2019-08-31 NOTE — Procedures (Signed)
Large Joint Aspiration/Injection Procedure: R hip joint    Date/Time: 08/31/2019 11:15 AM EDT  Consent given by: patient  Site marked: site marked  Timeout: Immediately prior to procedure a time out was called to verify the correct patient, procedure, equipment, support staff and site/side marked as required     Procedure Details    Location: hip - R hip joint  Preparation: The site was prepped using the usual aseptic technique.  Ultrasound guidance:  Ultrasound was utilized to improve needle visualization, injection accuracy, and anatomic localization.    Anesthetics administered: 5 mL lidocaine 2 %; 1 mL lidocaine 2 %; 2 mL lidocaine 2 %  Intra-Articular Steroids administered: 12 mg betamethasone acetate & sodium phosphate 6 (3-3) MG/ML  Dressing:  A dry, sterile dressing was applied.  Patient tolerance: patient tolerated the procedure well with no immediate complications

## 2019-09-14 MED ORDER — LIDOCAINE HCL 2 % IJ SOLN *I*
2.0000 mL | Freq: Once | INTRAMUSCULAR | Status: AC | PRN
Start: 2019-08-31 — End: 2019-08-31
  Administered 2019-08-31: 11:00:00 2 mL via INTRA_ARTICULAR

## 2019-09-14 MED ORDER — LIDOCAINE HCL 1 % IJ SOLN *I*
5.0000 mL | Freq: Once | INTRAMUSCULAR | Status: AC | PRN
Start: 2019-08-31 — End: 2019-08-31
  Administered 2019-08-31: 11:00:00 5 mL via INTRA_ARTICULAR

## 2019-09-14 MED ORDER — LIDOCAINE HCL 2 % IJ SOLN *I*
1.0000 mL | Freq: Once | INTRAMUSCULAR | Status: AC | PRN
Start: 2019-08-31 — End: 2019-08-31
  Administered 2019-08-31: 11:00:00 1 mL via INTRA_ARTICULAR

## 2019-09-14 MED ORDER — BETAMETHASONE ACET & SOD PHOS 6 (3-3) MG/ML IJ SUSP *I*
12.0000 mg | Freq: Once | INTRAMUSCULAR | Status: AC | PRN
Start: 2019-08-31 — End: 2019-08-31
  Administered 2019-08-31: 11:00:00 12 mg via INTRA_ARTICULAR

## 2019-09-14 MED ORDER — LIDOCAINE HCL 2 % IJ SOLN *I*
5.0000 mL | Freq: Once | INTRAMUSCULAR | Status: AC | PRN
Start: 2019-08-31 — End: 2019-08-31
  Administered 2019-08-31: 11:00:00 5 mL via INTRA_ARTICULAR

## 2019-09-23 NOTE — Progress Notes (Signed)
BMT SOCIAL WORK NOTE    Contact: MATTILYNN FORRER [phone]       [employer] [phone] (fax)     SW received disability papers from [employer] re: Ms. Giebler.  The patient's chart was reviewed and the forms were provided to [name] for review and signature.  After the signature is received the forms will be faxed to [employer], and a copy of the form will be entered into the patient's medical record.     [facility] SOCIAL WORK:   [facility] Social Work Therapist, nutritional:     Patient referred based on distress screen?: No      Social Work contact made?: No      Social Work Assessment:  Museum/gallery curator and Barriers to Care    Barriers to care:  Insurance    Social Work post assessment plan:  As needed follow-up and Referral  Referral:     Time spent on this encounter (in minutes):  [place], [place]  BMT/Leukemia Social Worker  [phone]

## 2019-10-15 ENCOUNTER — Other Ambulatory Visit: Payer: Self-pay | Admitting: Hematology

## 2019-10-15 DIAGNOSIS — Z9481 Bone marrow transplant status: Secondary | ICD-10-CM

## 2019-10-19 ENCOUNTER — Telehealth: Payer: Self-pay | Admitting: Hematology

## 2019-10-19 ENCOUNTER — Other Ambulatory Visit: Payer: Self-pay | Admitting: Internal Medicine

## 2019-10-19 DIAGNOSIS — Z1231 Encounter for screening mammogram for malignant neoplasm of breast: Secondary | ICD-10-CM

## 2019-10-28 ENCOUNTER — Ambulatory Visit: Payer: No Typology Code available for payment source

## 2019-11-02 ENCOUNTER — Ambulatory Visit: Payer: Medicare Other | Admitting: Orthopedic Surgery

## 2019-11-02 ENCOUNTER — Ambulatory Visit
Admission: RE | Admit: 2019-11-02 | Discharge: 2019-11-02 | Disposition: A | Discharge status: Home / Self Care | Payer: Medicare Other | Source: Ambulatory Visit

## 2019-11-02 ENCOUNTER — Encounter: Payer: Self-pay | Admitting: Orthopedic Surgery

## 2019-11-02 VITALS — BP 118/60 | Ht 66.0 in | Wt 258.0 lb

## 2019-11-02 DIAGNOSIS — M1610 Unilateral primary osteoarthritis, unspecified hip: Secondary | ICD-10-CM

## 2019-11-02 DIAGNOSIS — M7061 Trochanteric bursitis, right hip: Secondary | ICD-10-CM

## 2019-11-02 DIAGNOSIS — M160 Bilateral primary osteoarthritis of hip: Secondary | ICD-10-CM

## 2019-11-02 NOTE — Progress Notes (Signed)
Chief Complaint:  Hip pain.    History of Present Illness:  This is a 59 y.o. female who presents for follow up regarding her left > right hip pain. Last seen on 08/31/19 for this complaint. She received bilateralt intra-articular hip injection has been doing relatively well with them, endorsing partial relief that is still lasting.  She endorses significant persisting left-sided symptoms that did not respond to the injection.  These symptoms are at the lateral hip, though the gluts and leg.  This is worse when trying to sleep at night.  Patient does not want to explore surgical options at this point in time.  Denies numbness/tingling.       Treatment History:    Physical therapy: too painful   Yes _0  No _1  Helpful _2  Somewhat helpful _3  Not helpful _4     Exercise: too painful  Yes _5  No _6  Helpful _7  Somewhat helpful _8  Not helpful _9     Activity modification:   Yes _10  No _11  Helpful _12  Somewhat helpful _13  Not helpful _14     Brace:  Yes _15   No _16   Helpful _17   Somewhat helpful _18   Not helpful _19      Acetaminophen:   Yes _20  No _21  Helpful _22  Somewhat helpful _23  Not helpful _24     NSAIDs:  Ibuprofen 829m daily   Yes _25  No _26  Helpful _27  Somewhat helpful _28  Not helpful _29      Topicals:  Yes _30   No _31   Helpful _32   Somewhat helpful _33   Not helpful _34      Other Medications: Lexapro  Yes _35   No _36   Helpful _37   Somewhat helpful _38   Not helpful _39      Alternative Treatments:  Yes _40   No _41   Helpful _42   Somewhat helpful _43   Not helpful _44      Corticosteroid injections:   Right intra-articular hip 08/31/19 and greater trochanteric bursa 06/25/19.  There is better, long lasting improvement with intra-articular injections.   Yes _45  No _46  Helpful _47  Somewhat helpful _48  Not helpful _49     Viscosupplementation:   Yes _50  No _51  Helpful _52  Somewhat helpful _53  Not helpful _54     Radiofrequency Ablation:  Yes _55   No _56   Helpful _57   Somewhat helpful _58   Not helpful _59      Surgery:   Yes _60  No _61  Helpful _62  Somewhat helpful _63   Not helpful _64     Evaluation to date: I personally reviewed patient's updated x-rays which demonstrate progression of advanced bilateral hip osteoarthritis, with joint space narrowing and subchondral cystic changes.  Moderate osteophytosis.  Radiologist report is not available for review.      MRI L hip 12/14/16  1. Moderate degenerative disease of the left hip with associated acetabular cartilage loss, degenerative tearing of the labrum and a para labral cyst posterosuperiorly.      2. Mild soft tissue inflammation superficial to the greater trochanter without organized fluid collection. Moderate tendinopathy of the gluteus minimus tendon at the insertion on the greater trochanter. The findings may correlate with symptoms of greater   trochanteric bursitis.      3. No avascular necrosis or insufficiency fracture. No joint effusion.      END REPORT       MRI R hip 12/14/16  1. Moderate degenerative disease of the right hip with a focal area of chondral loss involving the anterosuperior acetabulum and subchondral cysts.      2. Mild soft tissue inflammation superficial to the greater trochanter without  organized fluid collection. Mild tendinopathy of the gluteus medius and minimus tendons at the insertion on the greater trochanter. The findings may correlate with symptoms of   greater trochanteric bursitis.      3. No avascular necrosis or insufficiency fracture. No joint effusion.      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    Conditioned w/ Busulfan 130 mg/m2/d and Fludarabine 40 mg/m2/d x4 days followed by 10/10 HLA MUD (F) Allogeneic PBSCT (pt/donor: ABO: A+/A+, CMV N/N)     Pulmonary edema     Shortness of breath        Past Surgical History:  Past Surgical History:   Procedure Laterality Date    ROTATOR CUFF REPAIR Right        Social History:  Social History     Socioeconomic History    Marital status: Divorced     Spouse name: Not on file    Number of children: Not on file    Years of education: Not on file    Highest education level: Not on file   Occupational History    Not on file   Tobacco Use    Smoking status: Former Smoker     Packs/day: 0.50     Years: 30.00     Pack years: 15.00     Quit date: 05/11/2015     Years since quitting: 4.4    Smokeless tobacco: Never Used    Tobacco comment: nicoderm patches started 05/11/15   Substance and Sexual Activity    Alcohol use: No     Comment: rare     Drug use: No    Sexual activity: Not Currently   Social History Narrative    Not on file         Family History:  Family History   Problem Relation Age of Onset    No Known Problems Mother     No Known Problems Father     No Known Problems Sister     No Known Problems Brother     Breast cancer Paternal Aunt        Review of Systems:  A complete 12 point review of systems is negative except as in HPI and listed below:  Bilateral Hip pain      Medications:    Allergies:  No Known Allergies (drug, envir, food or latex)      OBJECTIVE:   Vital signs:    Vitals:    11/02/19 1444   BP: 118/60   Weight: 117 kg (258 lb)   Height: 1.676 m (_0 )     General: no acute distress female  Mental Status:  Alert and oriented x3, pleasant and cooperative with exam  Extremities: All 4 extremities without edema, clubbing or cyanosis  Gait: Minimally antalgic, able to toe walk, heel walk.     Spine: No swelling, erythema, ecchymosis or step-off deformity.  Patient is nontender to palpation midline or over paravertebral soft tissue.  Nontender to palpation over SI joints bilaterally.  Full active  range of motion, increased pain with left lateral bending through the groin and gluts.  Lower  extremity strength is 4+/5, symmetric compared to contralateral leg.  Neurovascularly intact.    B/L Hip and Pelvis:  No swelling, erythema, ecchymosis or deformity. No warmth. TTP: Greater Trochanter: yes. There is some diminished range with pain.  Neurovascularly intact.  Decreased strength with hip flexion, extension, abduction and adduction, increased pain with abduction against resistance.     Patrick's / FABER test: positive.     FADIR test: positive.    Hip scouring: positive.        Assessment:  This is a 59 y.o. female with chronic bilateral L > R hip pain secondary to hip arthritis and gluteal tendinopathy.  She has better pain relief with ultrasound-guided intra-articular injections compared to trochanteric.    Plan:  -Encouraged physical therapy and weight loss, patient elects to contact the office for referral when she is planning to start  -Tylenol/OTC topicals and occasional NSAIDs as needed for pain  -Reviewed the role of repeat injection, she only had a few days relief with trochanteric injections therefore these are deferred.  She is eligible for repeat intra-articular injections after 12/01/2019.  She will contact the office around this time she would like to proceed  -Follow-up: As needed for repeat bilateral intra-articular hip injections after 12/01/2019, sooner if needed    Thank you for allowing [place] to take part in this patient's care and contact us if you have any questions.

## 2019-11-04 NOTE — Progress Notes (Addendum)
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  The symptoms lasted approximately 5 to 6 minutes, were sudden in onset, and were relieved prior to the MD entering the room.  She is encountering similar symptoms before with heartburn over 5 years ago.  She reports no radiation to the jaw or arm.  She reports no associated nausea or abdominal pain.  Reports no associated dyspnea, palpitations, chest pain or tightness, diaphoresis, or loss of consciousness/syncope.      The patient notes that she had a atypical meal last night consisting of steak, egg salad, lots of junk food, chips, and a considerable amount of candy bars.  Prior to presentation today she felt anxious and lightheaded with a shaky sensation.  This is occurred for her on occasion.  The symptoms abated somewhat when she ate a sandwich consisting of ham, female, and bland red peppers.  Her symptoms developed shortly thereafter.      She does report arthritis of the hip and groin on the left side.  Has had CSI in the past with considerable improvement, notes not as helpful recently.  Reports a burning sensation radiating from her gluteals, and strings, down the leg.  No longer taking Tylenol meloxicam or Ultram as these are no longer working.  Taking multiple ibuprofen without relief.  Seeing orthopedics for this currently.    Disease symptomatology: Reports ongoing fatigue.    GVHD symptomatology: Reports occasional diarrhea.  Reports longstanding skin rash for the last 1 years.  He is seeing dermatology for this.  Reports this is mainly along the bra line and can be irritated and occasionally bleeds.  Currently trying topical tea tree soap, medicated treatment, and antifungal  powder.    Continues to report daily nausea if not taking ondansetron.  Daily PPI.    Social update: Father passed away recently.  Got a new dog.    []  fevers  []  palpitations []  urinary frequency   []  chills [x]  chest pain []  dysuria    []  dry mouth []  nausea/emesis  []  rash   []  ear pain []  abdominal cramping []  skin changes   []  dry mouth []  abdominal discomfort []  pain   []  sore throat []  loose stool, non-formed [x]  fatigue   []  cough  []  diarrhea, watery []  enlarged lymph nodes   []  sputum production  []  weight loss [x]  Joint pains   []  shortness of breath []  insufficient oral intake []  none of the above     Medications  Reviewed medications with patient today & electronically reconciled  Current Outpatient Medications on File Prior to Visit   Medication Sig Dispense Refill    metoprolol tartrate (LOPRESSOR) 50 MG tablet TAKE 1 TABLET(50 MG) BY MOUTH TWICE DAILY 120 tablet 3    meloxicam (MOBIC) 7.5 MG tablet Take 1 tablet (7.5 mg total) by mouth daily 60 tablet 0    acyclovir (ZOVIRAX) 400 MG tablet TAKE 1 TABLET(400 MG) BY MOUTH TWICE DAILY 180 tablet 3    ondansetron (ZOFRAN) 4 MG tablet Take 1 tablet (4 mg total) by mouth 3 times daily as needed (nausea) 60 tablet 3    pantoprazole (PROTONIX) 40 MG EC tablet TAKE 1 TABLET (40 MG TOTAL) BY  MOUTH 2 TIMES DAILY SWALLOW WHOLE. DO NOT CRUSH, BREAK, OR CHEW. 180 tablet 3    cholecalciferol (VITAMIN D) 50 MCG (2000 UT) tablet Take 1,000 Units by mouth daily      levothyroxine (SYNTHROID, LEVOTHROID) 50 MCG tablet Take 50 mcg by mouth daily (before breakfast)      clotrimazole (LOTRIMIN) 1 % cream Apply topically 2 times daily 15 g 6    traMADol (ULTRAM) 50 MG tablet Take 1 tablet (50 mg total) by mouth every 6 hours as needed for Pain   Max daily dose: 200 mg 60 tablet 0    escitalopram (LEXAPRO) 20 MG tablet Take 20 mg by mouth daily  5    aspirin 81 MG EC tablet Take 81 mg by mouth daily      prochlorperazine (COMPAZINE) 10 MG tablet Take 1 tablet  (10 mg total) by mouth 4 times daily as needed for Nausea 40 tablet 6     No current facility-administered medications on file prior to visit.     VS, weight, KPS  There were no vitals taken for this visit.    Wt Readings from Last 3 Encounters:   11/02/19 117 kg (258 lb)   08/31/19 112.5 kg (248 lb)   06/25/19 112.5 kg (248 lb)       KPS%: 80    Physical Exam   General Appearance:    NAD, shifting frequently on examination due to pain and ambulates to the examination table and down the hall with a pronounced limp   Head:  Normocephalic, atraumatic   Eyes:  Conjunctivae/corneas clear, no eye drainage   Oral/Throat: MMM without lesions, irritation or exudate   Neck: Supple, normal turgor; no adenopathy.    Heart: S1, S2 normal, regular rhythm, 2/6 systolic ejection murmur noted radiating to bilateral carotids; no appreciable JVD   Lungs:   Respirations unlabored, CTAB without wheezes, rales or rhonchi   Abdomen:   Soft, non-tender, bowel sounds active all four quadrants, no masses, no organomegaly   Extremities: No edema   Pulses: Intact in all 4 extremities    Skin: Hypopigmented, macular rash noted on the abdomen and below the bra line without erythema or bleeding     No results found for this or any previous visit (from the past 24 hour(s)).  Will obtain blood work following this visit.    TTE 11/22/2015 with Normal LV size, mild increase in LV mass, no regional dysfunction and normal LV ejection fraction.  Mild mitral annular and aortic valvular sclerosis without stenosis. Unable to accurately estimated pulmonary artery systolic pressures but RV size and function appear normal.       Reviewed last EKG in 2017: Without ischemic changes.    Assessment and Plan  Carly Higgins is a 59 y.o. female with unclassifiable myeloproliferative neoplasm and completed Decitabine 63m/m2 IV daily x 5 days - Cycle # 1 12/15/14 and had been on Hydrea prior to transplant. She was conditioned with Bu/Flu followed by a 10/10 HLA  matched URD (female) Allogeneic PBSCT on 05/17/15 (pt/donor: ABO: A+/A+, CMV: neg/neg).    Atypical MPN S/P URD allo, now at 23 months.   Day 30 marrow (6/6) hypercellular marrow, no evidence of malignancy, chimerism 2% recipient  Day 100 BMbx (09/15/15) Normochromic normocytic anemia. Mild lymphopenia. Mildly hypercellular bone marrow with trilineage hematopoiesis. Chimerism 2% recipient.  1 year marrow with normocellularity and 100% donor chimerism   Await lab draw today    GVHD  Full dose MTX  given day 1,3,6. 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   Will follow LFTs.     ID  Hx Cholecystitis, pneumonia 05/2015  CMV PCR not required;  both negative   Will continue acyclovir for now.    Completed vaccines.  She has MMR left (June, 2020).  Had flu shot and COVID vaccine .    CV/Pulm  Hx A-flutter  Cont Metoprolol 50 mg BID  Followed by Dr. Kathi Der, Cardiology  Off cardizem, stopped by Dr. Kathi Der    Acute substernal burning chest discomfort: Nonanginal meeting 1/3 Diamond-Forrester Criteria, without radiation to the jaw or arm or gastrointestinal upset concerning for atypical ACS in female patient.  Low pretest probability for ACS given previously normal echocardiogram and EKG as above.  Overall consistent with gastritis/esophagitis/GERD given history and presentation.  - C/o daily nausea with use of ondansetron daily  - Consistent use of daily PPI  - Continue these medications given previous excellent control of GERD  - Declines EKG      Acute KI on CKD r/t tacrolimus  Repeat creatinine today  Encouraged PO intake  She takes a lot of ibuprofen, so will have to watch renal function closely.     GI  Cont protonix; especially as she is using NSAIDs but not as much as in past. .   Zofran prn    Defer to primary care provider if they believe re-referral to GI is indicated for persistent nausea requiring near daily use of zofran; unlikely to be  sequelae of GVHD this far out from transplant without other symptoms    MSK  Pain from elbow to wrist; with tingling of right middle fingers; has wrist splint to use.   Pain in bilateral knees with shooting sensation when attempting to stand-and bilateral hip pain; No AVN on hip films; still has pain over right sciatic notch. This has become severe and disabling to her.    Pain in LLE as above  Follows with orthopedics and has obtained CSI previously    Smoking cessation   Has been able to abstain thus far    Depression--somewhat situational    Derm  Rash overall c/w tinea vesicolor, recommend Selsun blue      Follow up  BMT clinic:6 months  Will f/u on blood work  [employer] frequency: labs prior    This note was dictated using Lobby[employer].  Despite proofreading, typographical errors may be present and are unintentional.      Carin Primrose, MD

## 2019-11-05 ENCOUNTER — Ambulatory Visit: Payer: No Typology Code available for payment source | Attending: Hematology | Admitting: Hematology

## 2019-11-05 ENCOUNTER — Other Ambulatory Visit: Payer: Self-pay

## 2019-11-05 ENCOUNTER — Other Ambulatory Visit
Admission: RE | Admit: 2019-11-05 | Discharge: 2019-11-05 | Disposition: A | Discharge status: Home / Self Care | Payer: No Typology Code available for payment source | Source: Ambulatory Visit

## 2019-11-05 VITALS — BP 153/78 | HR 74 | Temp 97.0°F | Resp 18 | Wt 263.7 lb

## 2019-11-05 DIAGNOSIS — D471 Chronic myeloproliferative disease: Secondary | ICD-10-CM | POA: Insufficient documentation

## 2019-11-05 LAB — CBC AND DIFFERENTIAL
Baso # K/uL: 0 10*3/uL (ref 0.0–0.1)
Basophil %: 0.4 %
Eos # K/uL: 0.3 10*3/uL (ref 0.0–0.4)
Eosinophil %: 2.9 %
Hematocrit: 43 % (ref 34–45)
Hemoglobin: 13.7 g/dL (ref 11.2–15.7)
IMM Granulocytes #: 0.1 10*3/uL — ABNORMAL HIGH (ref 0.0–0.0)
IMM Granulocytes: 0.6 %
Lymph # K/uL: 3 10*3/uL (ref 1.2–3.7)
Lymphocyte %: 27.3 %
MCH: 31 pg (ref 26–32)
MCHC: 32 g/dL (ref 32–36)
MCV: 98 fL — ABNORMAL HIGH (ref 79–95)
Mono # K/uL: 0.9 10*3/uL (ref 0.2–0.9)
Monocyte %: 8.7 %
Neut # K/uL: 6.6 10*3/uL — ABNORMAL HIGH (ref 1.6–6.1)
Nucl RBC # K/uL: 0 10*3/uL (ref 0.0–0.0)
Nucl RBC %: 0 /100 WBC (ref 0.0–0.2)
Platelets: 235 10*3/uL (ref 160–370)
RBC: 4.4 MIL/uL (ref 3.9–5.2)
RDW: 13.6 % (ref 11.7–14.4)
Seg Neut %: 60.1 %
WBC: 10.9 10*3/uL — ABNORMAL HIGH (ref 4.0–10.0)

## 2019-11-05 LAB — COMPREHENSIVE METABOLIC PANEL
ALT: 42 U/L — ABNORMAL HIGH (ref 0–35)
AST: 27 U/L (ref 0–35)
Albumin: 4.3 g/dL (ref 3.5–5.2)
Alk Phos: 97 U/L (ref 35–105)
Anion Gap: 12 (ref 7–16)
Bilirubin,Total: 0.3 mg/dL (ref 0.0–1.2)
CO2: 25 mmol/L (ref 20–28)
Calcium: 9.3 mg/dL (ref 8.6–10.2)
Chloride: 103 mmol/L (ref 96–108)
Creatinine: 0.81 mg/dL (ref 0.51–0.95)
GFR,Black: 92 *
GFR,Caucasian: 80 *
Glucose: 102 mg/dL — ABNORMAL HIGH (ref 60–99)
Lab: 18 mg/dL (ref 6–20)
Potassium: 4.9 mmol/L (ref 3.3–5.1)
Sodium: 140 mmol/L (ref 133–145)
Total Protein: 6.7 g/dL (ref 6.3–7.7)

## 2019-11-05 LAB — NEUTROPHIL #-INSTRUMENT: Neutrophil #-Instrument: 6.6 10*3/uL

## 2019-11-05 LAB — LACTATE DEHYDROGENASE: LD: 168 U/L (ref 118–225)

## 2019-11-05 MED ORDER — CALCIUM CARBONATE ANTACID 500 MG PO CHEW *I*
500.0000 mg | CHEWABLE_TABLET | Freq: Once | ORAL | Status: AC
Start: 2019-11-05 — End: 2019-11-06

## 2019-11-05 MED ORDER — FAMOTIDINE 20 MG PO TABS *I*
20.0000 mg | ORAL_TABLET | Freq: Two times a day (BID) | ORAL | 6 refills | Status: DC
Start: 2019-11-05 — End: 2020-05-19
  Filled 2019-11-05: qty 30, 15d supply, fill #0

## 2019-11-05 MED ORDER — CALCIUM CARBONATE ANTACID 750 MG PO CHEW *I*
1.0000 | CHEWABLE_TABLET | Freq: Three times a day (TID) | ORAL | 6 refills | Status: AC
Start: 2019-11-05 — End: 2019-12-05
  Filled 2019-11-05: qty 90, 30d supply, fill #0

## 2019-11-15 ENCOUNTER — Other Ambulatory Visit: Payer: Self-pay

## 2019-12-11 ENCOUNTER — Telehealth: Payer: Self-pay | Admitting: Hematology

## 2019-12-11 DIAGNOSIS — Z9481 Bone marrow transplant status: Secondary | ICD-10-CM

## 2019-12-11 DIAGNOSIS — R110 Nausea: Secondary | ICD-10-CM

## 2019-12-11 MED ORDER — ONDANSETRON HCL 4 MG PO TABS *I*
4.0000 mg | ORAL_TABLET | Freq: Three times a day (TID) | ORAL | 3 refills | Status: DC | PRN
Start: 2019-12-11 — End: 2021-01-04

## 2019-12-11 NOTE — Telephone Encounter (Signed)
Refill sent.

## 2019-12-11 NOTE — Telephone Encounter (Signed)
Spoke with patient, she is in [place], her father passed away.  Script will be sent to [facility] in [place]

## 2019-12-24 ENCOUNTER — Ambulatory Visit: Payer: No Typology Code available for payment source | Admitting: Hematology

## 2020-02-26 ENCOUNTER — Telehealth: Payer: Self-pay | Admitting: Hematology

## 2020-02-26 DIAGNOSIS — Z9481 Bone marrow transplant status: Secondary | ICD-10-CM

## 2020-02-26 MED ORDER — METOPROLOL TARTRATE 50 MG PO TABS *I*
50.0000 mg | ORAL_TABLET | Freq: Two times a day (BID) | ORAL | 3 refills | Status: DC
Start: 2020-02-26 — End: 2020-10-17

## 2020-02-26 NOTE — Telephone Encounter (Signed)
Refill sent per request.

## 2020-03-31 ENCOUNTER — Other Ambulatory Visit: Payer: Self-pay

## 2020-03-31 DIAGNOSIS — Z9481 Bone marrow transplant status: Secondary | ICD-10-CM

## 2020-03-31 MED ORDER — ACYCLOVIR 400 MG PO TABS *I*
400.0000 mg | ORAL_TABLET | Freq: Two times a day (BID) | ORAL | 3 refills | Status: AC
Start: 2020-03-31 — End: 2020-06-29

## 2020-05-09 ENCOUNTER — Ambulatory Visit: Payer: No Typology Code available for payment source | Admitting: Sports Medicine

## 2020-05-12 ENCOUNTER — Ambulatory Visit: Payer: No Typology Code available for payment source | Admitting: Hematology

## 2020-05-18 ENCOUNTER — Ambulatory Visit
Admission: RE | Admit: 2020-05-18 | Discharge: 2020-05-18 | Disposition: A | Discharge status: Home / Self Care | Payer: Medicare Other | Source: Ambulatory Visit | Attending: Internal Medicine | Admitting: Internal Medicine

## 2020-05-18 DIAGNOSIS — R928 Other abnormal and inconclusive findings on diagnostic imaging of breast: Secondary | ICD-10-CM

## 2020-05-18 DIAGNOSIS — Z1231 Encounter for screening mammogram for malignant neoplasm of breast: Secondary | ICD-10-CM | POA: Insufficient documentation

## 2020-05-19 ENCOUNTER — Other Ambulatory Visit
Admission: RE | Admit: 2020-05-19 | Discharge: 2020-05-19 | Disposition: A | Discharge status: Home / Self Care | Payer: Medicare Other | Source: Ambulatory Visit

## 2020-05-19 ENCOUNTER — Ambulatory Visit: Payer: Medicare Other | Attending: Hematology | Admitting: Hematology

## 2020-05-19 VITALS — BP 142/81 | HR 70 | Temp 95.9°F | Resp 17 | Wt 259.3 lb

## 2020-05-19 DIAGNOSIS — D471 Chronic myeloproliferative disease: Secondary | ICD-10-CM | POA: Insufficient documentation

## 2020-05-19 LAB — CBC AND DIFFERENTIAL
Baso # K/uL: 0.1 10*3/uL (ref 0.0–0.1)
Basophil %: 0.7 %
Eos # K/uL: 0.3 10*3/uL (ref 0.0–0.4)
Eosinophil %: 3.5 %
Hematocrit: 44 % (ref 34–45)
Hemoglobin: 14.2 g/dL (ref 11.2–15.7)
IMM Granulocytes #: 0 10*3/uL (ref 0.0–0.0)
IMM Granulocytes: 0.3 %
Lymph # K/uL: 2.7 10*3/uL (ref 1.2–3.7)
Lymphocyte %: 34.6 %
MCH: 30 pg (ref 26–32)
MCHC: 32 g/dL (ref 32–36)
MCV: 92 fL (ref 79–95)
Mono # K/uL: 0.5 10*3/uL (ref 0.2–0.9)
Monocyte %: 7 %
Neut # K/uL: 4.2 10*3/uL (ref 1.6–6.1)
Nucl RBC # K/uL: 0 10*3/uL (ref 0.0–0.0)
Nucl RBC %: 0 /100 WBC (ref 0.0–0.2)
Platelets: 260 10*3/uL (ref 160–370)
RBC: 4.8 MIL/uL (ref 3.9–5.2)
RDW: 13.3 % (ref 11.7–14.4)
Seg Neut %: 53.9 %
WBC: 7.7 10*3/uL (ref 4.0–10.0)

## 2020-05-19 LAB — COMPREHENSIVE METABOLIC PANEL
ALT: 28 U/L (ref 0–35)
AST: 28 U/L (ref 0–35)
Albumin: 4.5 g/dL (ref 3.5–5.2)
Alk Phos: 99 U/L (ref 35–105)
Anion Gap: 12 (ref 7–16)
Bilirubin,Total: 0.5 mg/dL (ref 0.0–1.2)
CO2: 25 mmol/L (ref 20–28)
Calcium: 9.7 mg/dL (ref 8.6–10.2)
Chloride: 100 mmol/L (ref 96–108)
Creatinine: 0.78 mg/dL (ref 0.51–0.95)
Glucose: 114 mg/dL — ABNORMAL HIGH (ref 60–99)
Lab: 17 mg/dL (ref 6–20)
Potassium: 5.3 mmol/L — ABNORMAL HIGH (ref 3.3–5.1)
Sodium: 137 mmol/L (ref 133–145)
Total Protein: 6.9 g/dL (ref 6.3–7.7)
eGFR BY CREAT: 87 *

## 2020-05-19 LAB — LACTATE DEHYDROGENASE: LD: 184 U/L (ref 118–225)

## 2020-05-19 LAB — IGG: IgG: 801 mg/dL (ref 700–1600)

## 2020-05-19 LAB — NEUTROPHIL #-INSTRUMENT: Neutrophil #-Instrument: 4.2 10*3/uL

## 2020-05-19 LAB — FERRITIN: Ferritin: 429 ng/mL — ABNORMAL HIGH (ref 10–120)

## 2020-05-19 LAB — TSH: TSH: 2.9 u[IU]/mL (ref 0.27–4.20)

## 2020-05-19 LAB — MULTIPLE ORDERING DOCS

## 2020-05-19 NOTE — Progress Notes (Signed)
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  Has had CSI in the past with considerable improvement, and she is hoping she can have more as her hip replacement has been delayed. This is severely limiting her mobility .  Reports a burning sensation radiating from her gluteals, and strings, down the leg.  No longer taking Tylenol meloxicam or Ultram as these are no longer working.  Taking multiple ibuprofen as prescribed by Dr. Rito Ehrlich.  She would like to lose weight but it is a difficult cycle as mobility is limited by pain.     Disease symptomatology: Reports ongoing fatigue.    GVHD symptomatology: Has had rash in intertriginous areas; no new rash, eye or mouth dryness. Breathing is stable.  No change in bowel or bladder function.   Continues to report daily nausea if not taking ondansetron. On PPI.     Social update: Father passed away recently.  She sees her mom only. Got a new dog.    []  fevers  []  palpitations []  urinary frequency   []  chills []  chest pain []  dysuria    []  dry mouth []  nausea/emesis  []  rash   []  ear pain []  abdominal cramping []  skin changes   []  dry mouth []  abdominal discomfort []  pain   []  sore throat []  loose stool, non-formed [x]  fatigue   []  cough  []  diarrhea, watery []  enlarged lymph nodes   []  sputum production  []  weight loss [x]  Joint pains   []  shortness of breath []  insufficient oral intake []  none of the above     Medications  Reviewed medications with patient today & electronically reconciled  Current Outpatient Medications on File Prior to Visit   Medication Sig Dispense Refill    acyclovir (ZOVIRAX) 400 mg tablet Take 1 tablet (400 mg total) by mouth 2 times daily 180 tablet 3    metoprolol  tartrate (LOPRESSOR) 50 mg tablet Take 1 tablet (50 mg total) by mouth 2 times daily 120 tablet 3    pantoprazole (PROTONIX) 40 MG EC tablet TAKE 1 TABLET (40 MG TOTAL) BY MOUTH 2 TIMES DAILY SWALLOW WHOLE. DO NOT CRUSH, BREAK, OR CHEW. (Patient taking differently: 40 mg daily  TAKE 1 TABLET (40 MG TOTAL) BY MOUTH 2 TIMES DAILY SWALLOW WHOLE. DO NOT CRUSH, BREAK, OR CHEW.) 180 tablet 3    cholecalciferol (VITAMIN D) 50 MCG (2000 UT) tablet Take 1,000 Units by mouth daily      levothyroxine (SYNTHROID, LEVOTHROID) 50 MCG tablet Take 50 mcg by mouth daily (before breakfast)      escitalopram (LEXAPRO) 20 MG tablet Take 20 mg by mouth daily  5    aspirin 81 MG EC tablet Take 81 mg by mouth daily      ondansetron (ZOFRAN) 4 mg tablet Take 1 tablet (4 mg total) by mouth 3 times daily as needed (nausea) 60 tablet 3     No current facility-administered medications on file prior to visit.     VS, weight, KPS  BP 142/81    Pulse 70    Temp 35.5 C (95.9 F) (Temporal)    Resp 17    Wt 117.6 kg (  259 lb 4.2 oz)    SpO2 95%    BMI 41.85 kg/m     Wt Readings from Last 3 Encounters:   05/19/20 117.6 kg (259 lb 4.2 oz)   11/05/19 119.6 kg (263 lb 10.7 oz)   11/02/19 117 kg (258 lb)       KPS%: 80    Physical Exam   General Appearance:    NAD, shifting frequently on examination due to pain and ambulates to the examination table and down the hall with a pronounced limp   Head:  Normocephalic, atraumatic   Eyes:  Conjunctivae/corneas clear, no eye drainage   Oral/Throat: MMM without lesions, irritation or exudate   Neck: Supple, normal turgor; no adenopathy.    Heart: S1, S2 normal, regular rhythm, 2/6 systolic ejection murmur noted radiating to bilateral carotids; no appreciable JVD   Lungs:   Respirations unlabored, CTAB without wheezes, rales or rhonchi   Abdomen:   Soft, non-tender, bowel sounds active all four quadrants, no masses, no organomegaly   Extremities: No edema   Pulses: Not checked.    Skin: Skin is tanned  wiithout obvious rash.      Recent Results (from the past 24 hour(s))   Multiple ordering docs    Collection Time: 05/19/20  9:47 AM   Result Value Ref Range    Mult Providers see below    CBC and differential    Collection Time: 05/19/20  9:50 AM   Result Value Ref Range    WBC 7.7 4.0 - 10.0 THOU/uL    RBC 4.8 3.9 - 5.2 MIL/uL    Hemoglobin 14.2 11.2 - 15.7 g/dL    Hematocrit 44 34 - 45 %    MCV 92 79 - 95 fL    MCH 30 26 - 32 pg    MCHC 32 32 - 36 g/dL    RDW 13.3 11.7 - 14.4 %    Platelets 260 160 - 370 THOU/uL    Seg Neut % 53.9 %    Lymphocyte % 34.6 %    Monocyte % 7.0 %    Eosinophil % 3.5 %    Basophil % 0.7 %    Neut # K/uL 4.2 1.6 - 6.1 THOU/uL    Lymph # K/uL 2.7 1.2 - 3.7 THOU/uL    Mono # K/uL 0.5 0.2 - 0.9 THOU/uL    Eos # K/uL 0.3 0.0 - 0.4 THOU/uL    Baso # K/uL 0.1 0.0 - 0.1 THOU/uL    Nucl RBC % 0.0 0.0 - 0.2 /100 WBC    Nucl RBC # K/uL 0.0 0.0 - 0.0 THOU/uL    IMM Granulocytes # 0.0 0.0 - 0.0 THOU/uL    IMM Granulocytes 0.3 %   Neutrophil #-Instrument    Collection Time: 05/19/20  9:50 AM   Result Value Ref Range    Neutrophil #-Instrument 4.2 THOU/uL         Lab results: 05/19/20  0950 11/05/19  1451   Sodium 137 140   Potassium 5.3* 4.9   Chloride 100 103   CO2 25 25   UN 17 18   Creatinine 0.78 0.81   GFR,Caucasian  --  80   GFR,Black  --  92   Glucose 114* 102*   Calcium 9.7 9.3   Total Protein 6.9 6.7   Albumin 4.5 4.3   ALT 28 42*   AST 28 27   Alk Phos 99 97   Bilirubin,Total 0.5 0.3  TTE 11/22/2015 with Normal LV size, mild increase in LV mass, no regional dysfunction and normal LV ejection fraction.  Mild mitral annular and aortic valvular sclerosis without stenosis. Unable to accurately estimated pulmonary artery systolic pressures but RV size and function appear normal.       Reviewed last EKG in 2017: Without ischemic changes.    Assessment and Plan  Carly Higgins is a 60 y.o. female with unclassifiable myeloproliferative neoplasm and completed Decitabine 32m/m2 IV daily x 5  days - Cycle # 1 12/15/14 and had been on Hydrea prior to transplant. 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    Atypical MPN S/P URD allo, now at 5 years  Day 30 marrow (6/6) hypercellular marrow, no evidence of malignancy, chimerism 2% recipient  Day 100 BMbx (09/15/15) Normochromic normocytic anemia. Mild lymphopenia. Mildly hypercellular bone marrow with trilineage hematopoiesis. Chimerism 2% recipient.  1 year marrow with normocellularity and 100% donor chimerism     GVHD  Full dose MTX given day 1,3,6. 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   Will follow LFTs.      ID  Hx Cholecystitis, pneumonia 05/2015  CMV PCR not required;  both negative   Will continue acyclovir for now.    Completed vaccines.  She has MMR left (June, 2020).  Had flu shot and COVID vaccine and one boost.  We discussed a 2nd boost would be safe for her and that should he get COVID, she should let Dr. CLenn Calor myself know as she could get Paxlovid .    CV/Pulm  Hx A-flutter  Cont Metoprolol 50 mg BID  Followed by Dr. MKathi Der [facility]    Acute KI on CKD r/t tacrolimus  Repeat creatinine today  Encouraged PO intake  She takes a lot of ibuprofen, so will have to watch renal function closely.     GI  Cont protonix; especially as she is using NSAIDs but not as much as in past. .   Zofran prn      MSK  Pain from elbow to wrist; with tingling of right middle fingers; has wrist splint to use.   Pain in bilateral knees with shooting sensation when attempting to stand-and bilateral hip pain; No AVN on hip films; still has pain overt sciatic notch. This has become severe and disabling to her.  She is under evaluation for a left hip replacement.    Follows with orthopedics and has obtained CSI previously    Smoking cessation   Has been able to abstain thus  far    Depression--somewhat situational    Derm  Rash overall c/w tinea vesicolor; skin is now tanned and healthy appearing.     Follow up  BMT clinic:6 months;; we discussed that 5 year post-SCT is an important benchmark.   Bloodwork frequency: labs prior      JCheron Schaumann MD

## 2020-06-03 ENCOUNTER — Other Ambulatory Visit: Payer: Self-pay

## 2020-06-03 DIAGNOSIS — R928 Other abnormal and inconclusive findings on diagnostic imaging of breast: Secondary | ICD-10-CM

## 2020-06-07 ENCOUNTER — Other Ambulatory Visit: Payer: Self-pay | Admitting: Surgery

## 2020-06-07 ENCOUNTER — Ambulatory Visit
Admission: RE | Admit: 2020-06-07 | Discharge: 2020-06-07 | Disposition: A | Discharge status: Home / Self Care | Payer: Medicare Other | Source: Ambulatory Visit | Attending: Diagnostic Radiology | Admitting: Diagnostic Radiology

## 2020-06-07 DIAGNOSIS — R928 Other abnormal and inconclusive findings on diagnostic imaging of breast: Secondary | ICD-10-CM | POA: Insufficient documentation

## 2020-06-07 DIAGNOSIS — N6321 Unspecified lump in the left breast, upper outer quadrant: Secondary | ICD-10-CM | POA: Insufficient documentation

## 2020-06-08 ENCOUNTER — Encounter: Payer: Self-pay | Admitting: Sports Medicine

## 2020-06-08 ENCOUNTER — Ambulatory Visit: Payer: Medicare Other | Admitting: Sports Medicine

## 2020-06-08 VITALS — BP 150/78 | HR 59 | Ht 66.0 in | Wt 260.0 lb

## 2020-06-08 DIAGNOSIS — M1610 Unilateral primary osteoarthritis, unspecified hip: Secondary | ICD-10-CM

## 2020-06-08 MED ORDER — BETAMETHASONE ACET & SOD PHOS 6 (3-3) MG/ML IJ SUSP *I*
12.0000 mg | Freq: Once | INTRAMUSCULAR | Status: AC | PRN
Start: 2020-06-08 — End: 2020-06-08
  Administered 2020-06-08: 12 mg via INTRA_ARTICULAR

## 2020-06-08 MED ORDER — LIDOCAINE HCL 1 % IJ SOLN *I*
5.0000 mL | Freq: Once | INTRAMUSCULAR | Status: AC | PRN
Start: 2020-06-08 — End: 2020-06-08
  Administered 2020-06-08: 5 mL via INTRA_ARTICULAR

## 2020-06-08 NOTE — Procedures (Signed)
Large Joint Aspiration/Injection Procedure: L hip joint    Date/Time: 06/08/2020  9:30 AM EDT  Consent given by: patient  Site marked: site marked  Timeout: Immediately prior to procedure a time out was called to verify the correct patient, procedure, equipment, support staff and site/side marked as required     Procedure Details    Location: hip - L hip joint  Preparation: The site was prepped using the usual aseptic technique.  Ultrasound guidance:  Ultrasound was utilized to improve needle visualization, injection accuracy, and anatomic localization.    Anesthetics administered: 5 mL lidocaine HCL 1 %  Intra-Articular Steroids administered: 12 mg betamethasone acetate-betamethasone sodium phosphate 6 (3-3) mg/mL  Dressing:  A dry, sterile dressing was applied.  Patient tolerance: patient tolerated the procedure well with no immediate complications

## 2020-06-08 NOTE — Progress Notes (Signed)
Chief Complaint:  Hip pain.    History of Present Illness:  This is a 60 y.o. female who presents for follow up regarding her left > right hip pain. Last seen on 08/31/19 for this complaint. She received bilateralt intra-articular hip injection has been doing relatively well with them, endorsing partial relief that is still lasting.  She endorses significant persisting left-sided symptoms that did not respond to the injection.  These symptoms are at the lateral hip, though the gluts and leg.  This is worse when trying to sleep at night.  Patient does not want to explore surgical options at this point in time.  Denies numbness/tingling.       Treatment History:    Physical therapy: too painful   Yes _0  No _1  Helpful _2  Somewhat helpful _3  Not helpful _4     Exercise: too painful  Yes _5  No _6  Helpful _7  Somewhat helpful _8  Not helpful _9     Activity modification:   Yes _10  No _11  Helpful _12  Somewhat helpful _13  Not helpful _14     Brace:  Yes _15   No _16   Helpful _17   Somewhat helpful _18   Not helpful _19      Acetaminophen:   Yes _20  No _21  Helpful _22  Somewhat helpful _23  Not helpful _24     NSAIDs:  Ibuprofen 825m daily   Yes _25  No _26  Helpful _27  Somewhat helpful _28  Not helpful _29      Topicals:  Yes _30   No _31   Helpful _32   Somewhat helpful _33   Not helpful _34      Other Medications: Lexapro  Yes _35   No _36   Helpful _37   Somewhat helpful _38   Not helpful _39      Alternative Treatments:  Yes _40   No _41   Helpful _42   Somewhat helpful _43   Not helpful _44      Corticosteroid injections:   Right intra-articular hip 08/31/19 and greater trochanteric bursa 06/25/19.  There is better, long lasting improvement with intra-articular injections.   Yes _45  No _46  Helpful _47  Somewhat helpful _48  Not helpful _49     Viscosupplementation:   Yes _50  No _51  Helpful _52  Somewhat helpful _53  Not helpful _54     Radiofrequency Ablation:  Yes _55   No _56   Helpful _57   Somewhat helpful _58   Not helpful _59      Surgery:   Yes _60  No _61  Helpful _62  Somewhat helpful _63   Not helpful _64     Evaluation to date: I personally reviewed patient's updated x-rays which demonstrate progression of advanced bilateral hip osteoarthritis, with joint space narrowing and subchondral cystic changes.  Moderate osteophytosis.  Radiologist report is not available for review.      MRI L hip 12/14/16  1. Moderate degenerative disease of the left hip with associated acetabular cartilage loss, degenerative tearing of the labrum and a para labral cyst posterosuperiorly.      2. Mild soft tissue inflammation superficial to the greater trochanter without organized fluid collection. Moderate tendinopathy of the gluteus minimus tendon at the insertion on the greater trochanter. The findings may correlate with symptoms of greater   trochanteric bursitis.      3. No avascular necrosis or insufficiency fracture. No joint effusion.      END REPORT       MRI R hip 12/14/16  1. Moderate degenerative disease of the right hip with a focal area of chondral loss involving the anterosuperior acetabulum and subchondral cysts.      2. Mild soft tissue inflammation superficial to the greater trochanter without  organized fluid collection. Mild tendinopathy of the gluteus medius and minimus tendons at the insertion on the greater trochanter. The findings may correlate with symptoms of   greater trochanteric bursitis.      3. No avascular necrosis or insufficiency fracture. No joint effusion.      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    Conditioned w/ Busulfan 130 mg/m2/d and Fludarabine 40 mg/m2/d x4 days followed by 10/10 HLA MUD (F) Allogeneic PBSCT (pt/donor: ABO: A+/A+, CMV N/N)     Pulmonary edema     Shortness of breath        Past Surgical History:  Past Surgical History:   Procedure Laterality Date    ROTATOR CUFF REPAIR Right        Social History:  Social History     Socioeconomic History    Marital status: Divorced     Spouse name: Not on file    Number of children: Not on file    Years of education: Not on file    Highest education level: Not on file   Occupational History    Not on file   Tobacco Use    Smoking status: Former Smoker     Packs/day: 0.50     Years: 30.00     Pack years: 15.00     Quit date: 05/11/2015     Years since quitting: 5.0    Smokeless tobacco: Never Used    Tobacco comment: nicoderm patches started 05/11/15   Substance and Sexual Activity    Alcohol use: No     Comment: rare     Drug use: No    Sexual activity: Not Currently   Social History Narrative    Not on file         Family History:  Family History   Problem Relation Age of Onset    No Known Problems Mother     No Known Problems Father     No Known Problems Sister     No Known Problems Brother     Breast cancer Paternal Aunt        Review of Systems:  A complete 12 point review of systems is negative except as in HPI and listed below:  Bilateral Hip pain      Medications:    Allergies:  No Known Allergies (drug, envir, food or latex)      OBJECTIVE:   Vital signs:    Vitals:    06/08/20 0931   BP: 150/78   Pulse: 59   Weight: 117.9 kg (260 lb)   Height: 1.676 m ($Remove'5\' 6"'yrWudXA$ )     General: no acute distress female  Mental Status:  Alert and oriented x3, pleasant and cooperative with exam  Extremities: All 4 extremities without edema, clubbing or cyanosis  Gait: Minimally antalgic, able to toe walk, heel walk.     Spine: No swelling, erythema, ecchymosis or step-off deformity.  Patient is nontender to palpation midline or over paravertebral soft tissue.  Nontender to palpation over SI joints  bilaterally.  Full active range of motion, increased pain with left lateral bending through the groin and  gluts.  Lower extremity strength is 4+/5, symmetric compared to contralateral leg.  Neurovascularly intact.    B/L Hip and Pelvis:  No swelling, erythema, ecchymosis or deformity. No warmth. TTP: Greater Trochanter: yes. There is some diminished range with pain.  Neurovascularly intact.  Decreased strength with hip flexion, extension, abduction and adduction, increased pain with abduction against resistance.     Patrick's / FABER test: positive.     FADIR test: positive.    Hip scouring: positive.        Assessment:  This is a 60 y.o. female with chronic bilateral L > R hip pain secondary to hip arthritis and gluteal tendinopathy.  She has better pain relief with ultrasound-guided intra-articular injections compared to trochanteric.    Plan:  -Encouraged physical therapy and weight loss, patient elects to contact the office for referral when she is planning to start  -Tylenol/OTC topicals and occasional NSAIDs as needed for pain  -Reviewed the role of repeat injection, she only had a few days relief with trochanteric injections therefore these are deferred.  She is eligible for repeat intra-articular injections after 12/01/2019.  She will contact the office around this time she would like to proceed  -Follow-up: As needed for repeat bilateral intra-articular hip injections after 12/01/2019, sooner if needed    Thank you for allowing [place] to take part in this patient's care and contact us if you have any questions.

## 2020-06-09 ENCOUNTER — Ambulatory Visit
Admission: RE | Admit: 2020-06-09 | Discharge: 2020-06-09 | Disposition: A | Discharge status: Home / Self Care | Payer: Medicare Other | Source: Ambulatory Visit | Attending: Diagnostic Radiology | Admitting: Diagnostic Radiology

## 2020-06-09 ENCOUNTER — Ambulatory Visit
Admission: RE | Admit: 2020-06-09 | Discharge: 2020-06-09 | Disposition: A | Discharge status: Home / Self Care | Payer: Medicare Other | Source: Ambulatory Visit

## 2020-06-09 DIAGNOSIS — R928 Other abnormal and inconclusive findings on diagnostic imaging of breast: Secondary | ICD-10-CM | POA: Insufficient documentation

## 2020-06-09 DIAGNOSIS — N6321 Unspecified lump in the left breast, upper outer quadrant: Secondary | ICD-10-CM | POA: Insufficient documentation

## 2020-06-09 DIAGNOSIS — Z171 Estrogen receptor negative status [ER-]: Secondary | ICD-10-CM

## 2020-06-09 DIAGNOSIS — C50412 Malignant neoplasm of upper-outer quadrant of left female breast: Secondary | ICD-10-CM

## 2020-06-09 MED ORDER — LIDOCAINE HCL 1 % IJ SOLN *I*
INTRAMUSCULAR | Status: AC
Start: 2020-06-09 — End: 2020-06-09
  Filled 2020-06-09: qty 20

## 2020-06-09 NOTE — Discharge Instructions (Signed)
Discharge Instructions    · Rest today. No heavy lifting, strenuous exercise. Resume routine activity tomorrow.  · Resume your normal diet.  · Avoid any over the counter medications that contain aspirin or nonsteroidal anti-inflammatory agents (examples: ibuprofen, Naprosyn) for 24 hours.  ·  You may use Tylenol (Acetaminophen).  · Use an ice pack over your incision site for 10-15 minutes every 2 hours for the rest of today.   · Keep dressing dry  · Keep your incision dry for 24 hours  · Wear bra at all times  · Sponge bath or tub bath- do not get dressing wet for 1st 24 hours  · You may take a bath/shower after 24 hours  · Keep steri-strips on for 3-4 days or until they fall off    If you have any problems related to your biopsy:  Before 4:00 PM, Please [phone] ([facility] Location)    Go to the nearest Emergency ([place], [place], [place],   [place]) if it is after 4 PM and you have:  · Severe PAIN not relieved with pain medication   · Swelling and/or Drainage at the incision site  · Heavy bleeding that will not stop after holding compression for 15 minutes  · Fever over 101 degrees F/ 38.4 degrees C

## 2020-06-13 ENCOUNTER — Telehealth: Payer: Self-pay

## 2020-06-13 NOTE — Telephone Encounter (Signed)
Spoke to patient schedule a npv for genetic consult on 06/15/20 at 2:00 PM with [facility]. Patient confirmed appointment time

## 2020-06-13 NOTE — Telephone Encounter (Signed)
-----   Message from Rodman Pickle, RN sent at 06/13/2020  2:15 PM EDT -----  Tish Men!    Pt above is scheduled with Dr. Bennie Hind this Wednesday at [place] and is interested in genetics. Would you please be able to help set pt up with appt?    Thank you!  Museum/gallery conservator

## 2020-06-13 NOTE — Progress Notes (Signed)
Breast New Patient Pathway   REGISTRATION:    Date of referral: 06/13/2020   Diagnosis: Left Invasive Ductal Carcinoma ER/PR/HER2 pending   Diagnosis date (pathology date): 06/09/2020   Appointment date: 06/15/2020   Discipline: Surgical Oncology   Nurse Navigator encounter type: Telephone and Chart Review   Time spent on this encounter (in minutes): 40  TREATMENT:    Evaluation by Surgical Oncology prior to Medical Oncology appointment: Yes  ASSESSMENT:   Need for Social Work involvement: Yes   Financial Obstacles: No   Travel Obstacles: No   Genetics referral: Yes    Telephone call made to patient prior to upcoming consult with Dr. Freda Higgins. Pt overwhelmed and surprised with new dx. Pt explained 3 months ago she picked her dog up and her left breast started aching. Pt was at the time in [place] with mother, and called the doctor when she was home. Pt states her father passed away 1 year ago and took her mother to [place] to get away.     Carly Higgins states she did have a BMT 5 years ago, received chemo pills for 1 year for MPN. 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 Carly Higgins had questions regarding treatment plan, writer explained this will be discussed at upcoming visit. Pt currently a smoker, educated pt on risk, pt aware and wants to stop. Carly Higgins also interested in genetic testing, will touch base with genetics team to help pt get scheduled. Pt states no financial or travel concerns.      Discussed writers role as Art therapist and reviewed the following related to her appointment:   Confirmed appointment date and time.  Encouraged patient to arrive at least 30 minutes prior to new patient visit to fill out paperwork and check in.  Support person coming to appointment: mother Carly Higgins and sister Carly Higgins what to expect with new patient appointment, who they could meet with, and length of appointment.    Discussed what to bring  with them:   -medication list  -HCP or living will if available  -new patient paperwork  -disability paperwork/FMLA if needed    Carly Higgins appreciative of call and knows to reach out with any questions or concerns.

## 2020-06-15 ENCOUNTER — Encounter: Payer: Self-pay | Admitting: Surgical Oncology

## 2020-06-15 ENCOUNTER — Ambulatory Visit: Payer: Medicare Other | Attending: Surgical Oncology | Admitting: Surgical Oncology

## 2020-06-15 ENCOUNTER — Other Ambulatory Visit: Payer: Self-pay | Admitting: Surgery

## 2020-06-15 ENCOUNTER — Other Ambulatory Visit: Payer: Self-pay | Admitting: Internal Medicine

## 2020-06-15 ENCOUNTER — Ambulatory Visit: Payer: Medicare Other | Admitting: Family

## 2020-06-15 VITALS — BP 124/82 | HR 60 | Temp 96.1°F | Ht 65.83 in | Wt 261.0 lb

## 2020-06-15 DIAGNOSIS — Z7183 Encounter for nonprocreative genetic counseling: Secondary | ICD-10-CM | POA: Insufficient documentation

## 2020-06-15 DIAGNOSIS — C50919 Malignant neoplasm of unspecified site of unspecified female breast: Secondary | ICD-10-CM | POA: Insufficient documentation

## 2020-06-15 DIAGNOSIS — C50412 Malignant neoplasm of upper-outer quadrant of left female breast: Secondary | ICD-10-CM | POA: Insufficient documentation

## 2020-06-15 DIAGNOSIS — C50912 Malignant neoplasm of unspecified site of left female breast: Secondary | ICD-10-CM

## 2020-06-15 DIAGNOSIS — F17200 Nicotine dependence, unspecified, uncomplicated: Secondary | ICD-10-CM

## 2020-06-15 DIAGNOSIS — D471 Chronic myeloproliferative disease: Secondary | ICD-10-CM | POA: Insufficient documentation

## 2020-06-15 NOTE — Progress Notes (Signed)
PATIENT INFORMATION/HPI: Carly Higgins 60 y.o. is a pleasant female who was seen for an appointment today with the [facility] Scre[facility] Risk Reduction [facility] for hereditary cancer risk assessment.    The patient was counseled and evaluated by me for genetic testing on 06/15/20, she is interested in genetic testing due to significant personal and family history of cancer.      Patient's problem list, allergies, and medications were not reviewed and updated as appropriate (N/A, counseling only). Please see the EHR for full details.    REFERRING: No ref. provider found     Care Team     Name Relationship Contact Number     [place], [place], MD      PCP - General [phone]     Marga Hoots, RN      Registered Nurse, Oncology           PERSONAL HISTORY OF CANCER:  The patient has a personal history of invasive breast cancer diagnosed at age 26. She also has a history of myeloproliferative disorder requiring bone marrow transplant.     The patient has intact breasts bilaterally, as well as an intact uterus/ovaries.     FAMILY HISTORY OF CANCER:   A three generation cancer family history was obtained at this visit.    - On maternal side, patient is of European descent, no Ashkenazi Jewish heritage.   Relevant maternal family history includes:   No known maternal family history of cancer.     - On paternal side, patient is of European descent, no Ashkenazi Jewish heritage.   Relevant paternal family history includes:  Father with throat cancer, smoker. Deceased. Paternal great aunt (grandmother's sister) with breast cancer, deceased.     Carly Higgins has one brother and one sister, no cancers.   Carly Higgins has no children.      ASSESSMENT:    Carly Higgins 60 y.o. female presents to the [facility]red[facility] Cancer Scre[facility] Risk Reduction Program for hereditary cancer risk assessment due to significant personal and family history of cancer.     Genetic  counseling was provided at today's appointment that discussed the following topics:     - I presented the patient an overview of cancer genetics, inheritance patterns, and current risk for hereditary cancer syndromes (5-10% of cancers explained by pathogenic germline variants, 65% of cancers considered sporadic). Some cancers (around 25%) are not explained by germline pathogenic variants but run in families and this risk is considered multifactorial (shared exposures, similar lifestyle, polymorphisms, copy number variants, and other potential causes not well defined).   - I explained the difference between a sporadic and inherited cancer. We discussed that most cancers are considered to be sporadic or environmentally-based (and a result of exposures throughout life as well as other inherited factors).   - I discussed panel testing, limited panel testing, and/or specific site testing/gene specific testing.   - When a familial cause for cancer (pathogenic variant) is unknown, panel testing is recommended. NCCN (in conjunction with current literature) supports broad panel testing when a hereditary cancer syndrome is unknown. Recent literature suggests up to 40% of variants are missed when focused testing is ordered Rip Harbour et al, 2021).   - I shared with the patient the meaning of results (for both the patient and her family members). This includes recommendations for enhanced screenings/surveillance for those with a germline pathogenic variant. If the patient is found to have a  pathogenic or likely pathogenic variant (mutation) in a gene associated with cancer risk, the variant is known or strongly suspected to be related to an increased risk for cancer based on what has been phenotypically observed in individuals with this germline pathogenic variant.   - I shared with Carly Higgins that because of her history of bone marrow transplant, she is not a candidate for saliva, blood or buccal testing. I shared with her that she  would need to have a fibroblast sample obtained and cultured for germline testing (since circulating blood cells in her body are likely that of her donor). I shared that this would require a skin punch biopsy and send off to an outside lab ([facility]) for culture, then sent to one of our laboratories that does not align with ASBrS guidelines, so she would be responsible for a bill, as insurance would not cover. I notified her that I am unable to provide her with a cost estimate for this testing, as she would likely get two separate bills (one for $250 for testing and another from [place]).   - I reassured her that the likelihood of her cancer being genetic is 5% or less based on current clinical data/literature. She has limited family history and does not meet NCCN criteria for testing. She is comfortable with not proceeding at this time.   - I did share that if more family history was to be identified, we could consider 1.) testing her with fibroblast if she met NCCN criteria or 2.) testing relative who was diagnosed. She is agreeable to this plan.   PLAN:    The patient and I decided not to proceed with testing. She will reach out if she changes her mind at any time, or if she learns about any other family history of cancer.     The patient was encouraged to call the hereditary clinic office at any time if problems or questions arise. The patient was counseled that questions may arise throughout the process, and were encouraged to call or utilize MyChart if applicable. She had the opportunity to ask questions.  All her questions were answered to her satisfaction. She is agreeable with this plan.    20 minutes was spent on the calendar day of the encounter reviewing the EMR and management of this patient. This time was also spent with the patient reviewing cancer genetics, cancer risk, cancer screening, and risk reduction.  More than 50% of the time with the patient was spent counseling the patient about coordination of  care and emotional support related to genetic testing.      Patient and patient visitor complied with masking policy throughout duration of appointment. Writer maintained use of mask and faceshield/eyewear throughout duration of appointment. Writer was within 6 feet of patient for >15 minutes due to the nature of the treatment / visit.     Helvi Royals L. Nash Dimmer, FNP-BC  [place] [facility]  [facility]   [phone]

## 2020-06-15 NOTE — Progress Notes (Addendum)
Breast New Patient Pathway   REGISTRATION:    Appointment date: 06/15/2020   Discipline: Surgical Oncology   Nurse Navigator encounter type: Clinic/Office  TREATMENT:    Date of Medical Oncology appointment (if initially seen by Surg/Onc): 06/24/2020  Time spent on this encounter (in minutes): 34    Writer met with Carly Higgins prior to her appointment with Dr. Freda Munro. Carly Higgins is accompanied by her mother Carly Higgins and sister Carly Higgins. Carly Higgins states she is ready to hear what the plan is moving forward and has many questions. Carly Higgins asked about what treatment is going to look like and recovery time. Writer explained that this will all depend on what recommendations are discussed today and what the will be. [name] has a great support system between [place]. Writer provided pt new patient binder and contact card, encouraged pt and family to reach out with any follow up questions, concerns or support after appointment.

## 2020-06-15 NOTE — H&P (Signed)
[facility]  [id number]    Referring Physician: Benedict Needy, MD     [address]  [place] [address]     [phone]    Chief Complaint: The patient is referred for consultation regarding management of her newly diagnosed breast cancer.    HPI: Carly Higgins is a pleasant 60 y.o. White female seen on 06/15/2020 for consultation at [place] [facility]. She had a screening mammogram recently showing an abnormality on the left. That was biopsied and found to be a "triple negative" cancer. She had only been aware of discomfort at the site which had been present for a few months. She did not appreciate a lump or skin changes. She has a personal history of myeloproliferative disorder for which she had a bone marrow transplant in 2017. She had a good deal of chemotherapy prior. No family history of malignancy except throat cancer in her father. She is scheduled for genetics testing today. Her oncologic history is as follows:     Oncology History   IDC left 2 o'clock 2.5 cm, ER/PR- her 2-   05/18/2020 Significant Radiology Findings    Screening [place]: scattered tissue, architectural distortion left 2 o'clock. BIRAD 0       06/07/2020 Significant Radiology Findings    Ultrasound [place]: irregular hypoechoic mass left 2 o'clock, 19x19x12 mm, 25 mm radial. BIRAD 4  Ultrasound guided core IDC     06/15/2020 Initial Diagnosis    IDC left 2 o'clock 2.5 cm, ER/PR- her 2-     06/15/2020 Cancer Staged    Staging form: Breast, AJCC 8th Edition  - Clinical stage from 06/15/2020: Stage IIB (cT2, cN0, cM0, G3, ER-, PR-, HER2-)           Medical History:   Past Medical History:   Diagnosis Date    Atrial fibrillation     fib/flutter 05/2015    CHF (congestive heart failure)     pulmonary edema 05/28/15    GERD (gastroesophageal reflux disease)     Hypoxia 06/11/2015    Impaired mobility and ADLs 06/11/2015    Leukocytosis 09/22/2013    post  allogenic MUD (F)  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    Conditioned w/ Busulfan 130 mg/m2/d and Fludarabine 40 mg/m2/d x4 days followed by 10/10 HLA MUD (F) Allogeneic PBSCT (pt/donor: ABO: A+/A+, CMV N/N)    Pulmonary edema     Shortness of breath          Medications:   Current Outpatient Medications   Medication Sig Note    acyclovir (ZOVIRAX) 400 mg tablet Take 1 tablet (400 mg total) by mouth 2 times daily     metoprolol tartrate (LOPRESSOR) 50 mg tablet Take 1 tablet (50 mg total) by mouth 2 times daily     ondansetron (ZOFRAN) 4 mg tablet Take 1 tablet (4 mg total) by mouth 3 times daily as needed (nausea)     cholecalciferol (VITAMIN D) 50 MCG (2000 UT) tablet Take 1,000 Units by mouth daily     levothyroxine (SYNTHROID, LEVOTHROID) 50 MCG tablet Take 50 mcg by mouth daily (before breakfast)     escitalopram (LEXAPRO) 20 MG tablet Take 20 mg by mouth daily 05/15/2016: Received from: External Pharmacy Received Sig: TAKE 1 TABLETS ORALLY EVERY DAY    aspirin 81 MG EC tablet Take 81 mg by mouth daily  No current facility-administered medications for this visit.       Allergies: has No Known Allergies (drug, envir, food or latex).    Surgical history:   Past Surgical History:   Procedure Laterality Date    ROTATOR CUFF REPAIR Right        Social:   Social History     Socioeconomic History    Marital status: Divorced     Spouse name: Not on file    Number of children: Not on file    Years of education: Not on file    Highest education level: Not on file   Occupational History    Not on file   Tobacco Use    Smoking status: Former Smoker     Packs/day: 0.50     Years: 30.00     Pack years: 15.00     Quit date: 05/11/2015     Years since quitting: 5.1    Smokeless tobacco: Never Used    Tobacco comment: nicoderm patches started 05/11/15   Substance and Sexual Activity    Alcohol use: No     Comment: rare     Drug use: No    Sexual activity: Not Currently   Social History Narrative    Not on file           Problem List:    Patient Active Problem List   Diagnosis Code    MPN (myeloproliferative neoplasm) D47.1    Myeloproliferative disease D47.1    post allogenic MUD (F)  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 Z94.81    Immunocompromised D84.9    Hyperkalemia E87.5    Bilateral hip pain. M25.551, M25.552    A-fib I48.91    Cognitive impairment R41.89    IDC left 2 o'clock 2.5 cm, ER/PR- her 2- C50.412       Family History:   Family History   Problem Relation Age of Onset    No Known Problems Mother     No Known Problems Father     No Known Problems Sister     No Known Problems Brother     Breast cancer Paternal Aunt       Family Status   Relation Name Status    Mother  Alive    Father  Alive    Sister  Alive    Brother  Alive    PAunt  (Not Specified)        late 8s       Review of Systems: Comprehensive ROS was taken on the patient intake form and reviewed with the patient. Pertinent items are noted in HPI.      Physical Exam:  Vitals: BP 124/82    Pulse 60    Temp 35.6 C (96.1 F) (Temporal)    Ht 167.2 cm (5' 5.83")    Wt 118.4 kg (261 lb 0.4 oz)    SpO2 96%    BMI 42.35 kg/m    General appearance: alert, appears stated age and cooperative  Head: Normocephalic, without obvious abnormality, atraumatic  Eyes: conjunctivae/corneas clear.  Ears: External ears are normal, hearing is grossly intact.  Neck: no adenopathy, supple, symmetrical, trachea midline and thyroid not enlarged, symmetric, no tenderness/mass/nodules  Lungs: clear to auscultation bilaterally  Heart: regular rate and rhythm, S1, S2 normal, no murmur, click, rub or gallop  Abdomen: Soft, nontender, nondistended, no organomegaly, no palpable masses.  Extremities: extremities normal, atraumatic, no cyanosis or edema  Skin: Skin color, texture, turgor normal. No  rashes or lesions  Lymph nodes: Cervical, supraclavicular, and axillary nodes normal.  Breasts: Comprehensive breast examination was performed in the seated and supine positions. The  breasts are of large size and markedly ptotic/pendulous. The breasts are symmetric with normal shape and contour. The skin is without erythema, edema, nodules or other lesions but there is a prominent bruise at the site of biopsy on the left. Nipples are normal and everted. On palpation there are no masses palpable on the right. There is a masslike area at 2 o'clock on the left which feels to be a hematoma. No discharge is expressible.     Imaging:     All breast imaging studies were personally reviewed by [place] and findings are per the HPI.    Pathology:   All pathology reports were personally reviewed by [place] and findings are per the HPI. Slides have been requested for our review.      Impression/Plan:  This is a 60 year old with a "triple negative" left breast cancer that appears to be about 2.5 cm. We discussed the nature of her diagnosis and treatment options in great detail. Chemotherapy is almost always recommended for tumors of this histology. Her prior treatment may well complicate these decisions. I would like her to see medical oncology as soon as possible to begin discussions. Consideration might be given to neoadjuvant treatment if the tumor is larger than it appears or if she ultimately decides that mastectomy with reconstruction is her preference.     She is likely a candidate for breast conservation but I have ordered an MRI to better define extent of disease. She is scheduled for genetics counseling later today (that cannot be done with blood in her case). Bilateral mastectomy would only be an advantage to her if she were found to have a high risk genetic mutation. Sentinel node biopsy is indicated for staging.     I have ordered a metastatic workup as this tumor is higher risk. I have also initiated referral to plastic surgery in the event she chooses mastectomy or it is indicated based on genetics results or extent of disease.

## 2020-06-16 NOTE — Progress Notes (Signed)
[facility] SOCIAL WORK:   [facility] Social Work Therapist, nutritional:     Patient referred based on distress screen?: Yes      Social Work contact made?: Yes      Method of contact:  Telephone    Other method of contact:  [phone]     Social Work Assessment:  Adjustment to Diagnosis and Emotional    Social Work post assessment plan:  Referral  Referral:     Financial support:  [place] and Patient [place]    Time spent on this encounter (in minutes):  7      Writer reached out to pt as a supportive check in, due to high distress-screening score of 8 yesterday. Writer explained the supportive role of social work and provided Dentist. Patient is interested in applying for SNAP, but does not have Medicaid. Writer explained the process for applying for SNAP to see if she may meet the income level required. Writer to provide 1 $25 [employer] gift card to patient when she comes to her next clinic visit on 06/24/20. Writer also offered [facility] FAA application. Social work to [employer] on this date.]Social work to continue to follow for support and intervention as needed.       Benito Mccreedy, [place]  Oncology Social Worker  [facility]  [phone]

## 2020-06-17 ENCOUNTER — Telehealth: Payer: Self-pay

## 2020-06-17 ENCOUNTER — Telehealth: Payer: Self-pay | Admitting: Surgical Oncology

## 2020-06-17 ENCOUNTER — Other Ambulatory Visit: Payer: No Typology Code available for payment source

## 2020-06-17 NOTE — Telephone Encounter (Signed)
Lm. Not able to move up CT CAP sooner than July 1st. Provided radiology number [phone] for the patient to call to check for cancellations as they do not have a cancellation list.

## 2020-06-17 NOTE — Telephone Encounter (Signed)
Called pt, she states that she had an appointment yesterday for CT, states she was not given information that she had to arrive 1 hr earlier and that she could not have anything to eat or drink prior to scan so scan ended up being rescheduled. The next appointment they could give her was 07/08/20. Writer will see if clerical can get CT sooner and notify patient. Pt agreeable to plan.

## 2020-06-17 NOTE — Telephone Encounter (Signed)
pt was calling to Let Dr Freda Munro know pt had to reschedule pt CT 07/08/20 please call with questions

## 2020-06-19 ENCOUNTER — Encounter: Payer: Self-pay | Admitting: Hematology

## 2020-06-20 ENCOUNTER — Ambulatory Visit
Admission: RE | Admit: 2020-06-20 | Discharge: 2020-06-20 | Disposition: A | Discharge status: Home / Self Care | Payer: Medicare Other | Source: Ambulatory Visit | Attending: Surgical Oncology | Admitting: Surgical Oncology

## 2020-06-20 ENCOUNTER — Ambulatory Visit
Admission: RE | Admit: 2020-06-20 | Discharge: 2020-06-20 | Disposition: A | Discharge status: Home / Self Care | Payer: Medicare Other | Source: Ambulatory Visit

## 2020-06-20 DIAGNOSIS — C50412 Malignant neoplasm of upper-outer quadrant of left female breast: Secondary | ICD-10-CM | POA: Insufficient documentation

## 2020-06-20 MED ORDER — TECHNETIUM TC-99M MEDRONATE (MDP) IV *I*
18.0000 | PACK | Freq: Once | INTRAVENOUS | Status: AC
Start: 2020-06-20 — End: 2020-06-20
  Administered 2020-06-20: 20.02 via INTRAVENOUS

## 2020-06-22 ENCOUNTER — Ambulatory Visit
Admission: RE | Admit: 2020-06-22 | Discharge: 2020-06-22 | Disposition: A | Discharge status: Home / Self Care | Payer: Medicare Other | Source: Ambulatory Visit | Attending: Surgical Oncology | Admitting: Surgical Oncology

## 2020-06-22 ENCOUNTER — Ambulatory Visit: Payer: Medicare Other | Admitting: Surgery

## 2020-06-22 VITALS — BP 123/65 | HR 64 | Temp 96.5°F | Resp 16 | Ht 65.83 in | Wt 264.0 lb

## 2020-06-22 DIAGNOSIS — C50412 Malignant neoplasm of upper-outer quadrant of left female breast: Secondary | ICD-10-CM | POA: Insufficient documentation

## 2020-06-22 DIAGNOSIS — Z7189 Other specified counseling: Secondary | ICD-10-CM | POA: Insufficient documentation

## 2020-06-22 MED ORDER — IOHEXOL 350 MG/ML (OMNIPAQUE) IV SOLN *I*
1.0000 mL | Freq: Once | INTRAVENOUS | Status: AC
Start: 2020-06-22 — End: 2020-06-22
  Administered 2020-06-22: 149 mL via INTRAVENOUS

## 2020-06-22 MED ORDER — BARIUM SULFATE (REDI-CAT) 2 % PO SUSP *I*
900.0000 mL | Freq: Once | ORAL | Status: AC
Start: 2020-06-22 — End: 2020-06-22
  Administered 2020-06-22: 900 mL via ORAL

## 2020-06-22 NOTE — H&P (Signed)
New Patient Consultation    Referred by No ref. provider found    History of Present Illness    Today I had the pleasure of meeting Carly Higgins. She is a 60 y.o. female who presents with a chief complaint of Left IDC breast cancer.    Her breast cancer history is listed below:  Oncology History   IDC left 2 o'clock 2.5 cm, ER/PR- her 2-   05/18/2020 Significant Radiology Findings    Screening [place]: scattered tissue, architectural distortion left 2 o'clock. BIRAD 0       06/07/2020 Significant Radiology Findings    Ultrasound [place]: irregular hypoechoic mass left 2 o'clock, 19x19x12 mm, 25 mm radial. BIRAD 4  Ultrasound guided core IDC     06/15/2020 Initial Diagnosis    IDC left 2 o'clock 2.5 cm, ER/PR- her 2-     06/15/2020 Cancer Staged    Staging form: Breast, AJCC 8th Edition  - Clinical stage from 06/15/2020: Stage IIB (cT2, cN0, cM0, G3, ER-, PR-, HER2-)          She currently wears a size 42B bra and would like to ideally be size B.   She does not have a history of breast/chest wall radiation.   She has a history of 3 pregnancies and 2 children, 0 of which were delivered by C-section.    She breastfed for a total of 0 months and does not have plans for future pregnancies or breastfeeding.    She is considering undergoing unilateral partial or total mastectomy and is interested in breast reconstruction.     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  She  has a past surgical history that includes Rotator cuff repair (Right).  She Cancer-related family history includes Breast cancer in her paternal aunt.  She has No Known Allergies (drug, envir, food or latex).  She has a current medication list which includes the following  prescription(s): acyclovir, metoprolol tartrate, cholecalciferol, levothyroxine, escitalopram, aspirin, and ondansetron.  She  reports that she quit smoking about 5 years ago. She has a 15.00 pack-year smoking history. She has never used smokeless tobacco. She reports that she does not drink alcohol and does not use drugs.  She currently is not working.  She is Divorced and lives alone.    A full 12-point review of systems was obtained in our intake paperwork. Of note, it is positive for fatigue, weight gain, breast pain on the left.    Physical Exam    Vital signs: BP 123/65    Pulse 64    Temp 35.8 C (96.5 F) (Temporal)    Resp 16    Ht 1.672 m (5' 5.83")    Wt 119.7 kg (264 lb)    SpO2 97%    BMI 42.83 kg/m   General appearance: Alert, well appearing, and in no distress.   Skin: Skin color, texture and turgor are overall normal for age.  Breasts:   RIGHT LEFT   Size  Right is   equal to the left large large   Ptosis 2 2   Shape/contour normal normal   Capsule grade N/A N/A   Upper pole pinch N/A N/A   Skin normal  bruised    Scars No No   Shoulder grooving  none none   Skin changes with arm maneuvers none none   Nipples everted everted   Nipple discharge no no   Masses none palpated none palpated      MEASUREMENTS RIGHT LEFT   Sternal notch - nipple 31 cm 33 cm   Nipple - IMF 13 cm 11 cm   Base width 17 cm 17 cm       Back: adequate donor skin and tissue  Abdomen: adequate donor skin and tissue for flap reconstruction, adequate donor fat for fat grafting.      Assessment and Recommendations    Carly Higgins is a 61 y.o. female with a Left IDC who is considering undergoing breast reconstruction following left partial mastectomy or total mastectomy (unsure yet).     We discussed the following:   Reconstruction options in detail, comparing various autologous and prosthetic techniques, as well as oncoplastic techniques.     Reconstruction timing in detail, comparing immediate, delayed, and immediate-delayed  options.    The success of any reconstruction depends on the recovery of the breast skin after mastectomy, and occasionally the reconstructive plan may need to change suddenly and drastically depending on mastectomy skin survival, final pathology, and necessary adjuvant therapies, especially radiation.   The risks of surgery under general anesthesia, including but not limited to infection, bleeding, damage to surrounding structures, asymmetry, numbness, pain, scar, wound healing problems, dissatisfaction, need for additional procedures, and DVT/PE. We also discussed the anticipated course of wound healing and scar maturation, the need for future revisionary surgery(ies), and the expected recovery process.    She is a poor candidate for immediate breast reconstruction due to obesity, smoking and comorbidities. She was encouraged to proceed with her oncologic surgery and focus on modifying her risk factors so that she may become a candidate for reconstruction in the future. We discussed the timing of delayed reconstruction, and based on her current status, would plan to pursue reconstruction if needed. She already has an appointment with the [facility] for smoking cessation.     At the end of our discussion, Joelyn decided to proceed with her oncologic treatments and follow up once she is optimized for reconstruction.    Photos taken: No  ASPS brochure provided: Yes  Implant brochure/materials provided: No  Preop instructions provided: No  Referral to support groups: No     Reginia Naas, NP

## 2020-06-23 ENCOUNTER — Telehealth: Payer: Self-pay | Admitting: Surgical Oncology

## 2020-06-23 LAB — SURGICAL PATHOLOGY

## 2020-06-23 NOTE — Telephone Encounter (Signed)
Pt is calling to discuss the results of her CT scan.

## 2020-06-23 NOTE — Telephone Encounter (Signed)
Spoke with Santiago Glad. Reviewed CT results showing known left breast mass and ? Left axillary LN though no evidence of distant metastatic disease. Scheduled for MRI on Tuesday 6/21 which will look closer and the breast and LN. She is scheduled to see Dr. Isaiah Serge tomorrow. She was thankful for the call.     Oralia Manis, NP

## 2020-06-24 ENCOUNTER — Ambulatory Visit: Payer: Medicare Other | Attending: Hematology and Oncology | Admitting: Hematology and Oncology

## 2020-06-24 ENCOUNTER — Encounter: Payer: Self-pay | Admitting: Hematology and Oncology

## 2020-06-24 ENCOUNTER — Telehealth: Payer: Self-pay | Admitting: Surgical Oncology

## 2020-06-24 VITALS — BP 125/80 | HR 65 | Temp 97.9°F | Ht 66.06 in | Wt 262.8 lb

## 2020-06-24 DIAGNOSIS — Z7189 Other specified counseling: Secondary | ICD-10-CM | POA: Insufficient documentation

## 2020-06-24 DIAGNOSIS — Z171 Estrogen receptor negative status [ER-]: Secondary | ICD-10-CM | POA: Insufficient documentation

## 2020-06-24 DIAGNOSIS — C50412 Malignant neoplasm of upper-outer quadrant of left female breast: Secondary | ICD-10-CM

## 2020-06-24 MED ORDER — DIAZEPAM 5 MG PO TABS *I*
5.0000 mg | ORAL_TABLET | Freq: Two times a day (BID) | ORAL | 0 refills | Status: DC | PRN
Start: 2020-06-24 — End: 2020-07-15

## 2020-06-24 NOTE — Telephone Encounter (Signed)
Calling to get a script for a sedative sent to [facility]

## 2020-06-24 NOTE — Progress Notes (Signed)
[facility] SOCIAL WORK:   [facility] Social Work Therapist, nutritional:     Patient referred based on distress screen?: No      Social Work contact made?: Yes      Method of contact:  In-person    Social Work Assessment:  Museum/gallery curator    Social Work post assessment plan:  As needed follow-up and Referral  Referral:     Financial support:  Patient Carly Higgins    Time spent on this encounter (in minutes):  5    Pt's primary SW is currently out-of-office Water quality scientist assisted with this case today.   Pt was in for appt today, reports there is supposed to be an envelope at front-desk for her but she doesn't see one.     Per previous SW note, pt is expecting PNF. Writer provided pt with 1 $25 [employer] PNF GC. Pt very appreciative.    No other SW questions or needs identified at this time. Social work to remain available throughout the course of pt's care.     Franny Selvage-, MSEd., [place]  Oncology Social Worker  [phone]

## 2020-06-24 NOTE — Patient Instructions (Addendum)
You have been diagnosed with invasive breast cancer involving the left breast. The type of cancer that you have is referred to as triple negative breast cancer since the tumor cells lack expression of estrogen and progesterone receptors as well as over-expression of HER2. Triple negative breast cancer carries a higher risk for spreading to sites outside of the breast (such as bone, liver, lungs and lymph nodes). Fortunately, the staging scans did not show evidence of spread of the cancer to distant parts of your body, although several lymph nodes under the left arm are somewhat suspicious for containing metastatic disease.      For triple negative breast cancer we typically give chemotherapy to reduce the chance of having a recurrence in distant sites (referred to as distant metastatic disease) by about 30-40%. In addition, there is a similar reduction in risk of dying from breast cancer. Chemotherapy can be given either before surgery or after surgery. Given the features of your tumor, pre-operative chemotherapy would usually be considered.     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 I would like to talk to [name] to get her input regarding the status of your bone marrow before deciding on a treatment plan. In the meantime, you should get the MRI scan next week as scheduled. This may provide more information regarding the size of the tumor and whether any axillary lymph nodes appear abnormal. If there are some abnormal appearing lymph nodes, then we might consider obtaining a biopsy if you are going to receive chemotherapy prior to surgery.     Our goal for treatment is to cure your breast cancer.    RECOMMEND     MRI of breasts on July 28, 2020     Chemotherapy either before (preferred) or after surgery. Regimen to be used will be determined after discussion with [name].     Echocardiogram in the next 10 days to assess cardiac function prior to starting  chemotherapy.     Follow up in 1 week to finalize plan regarding chemotherapy.                   June 2022      'Sunday Monday Tuesday Wednesday Thursday Friday Saturday                  1    INJECTION   9:30 AM   (15 min.)   Mirabelli, Mark, MD   Department of Orthopaedics and Rehabilitation 2    RIS BI US INVASIVE 60  10:00 AM   (60 min.)   RIS, SMH BI1 (SBDG1)   [facility]    RIS BI MG POST CLIP  11:00 AM   (30 min.)   RIS, SMH BI1 (SBDG1)   [facility] 3     4       5     6     7     8    NEW PATIENT VISIT  10:00 AM   (60 min.)   Olzinski-Kunze, Ann, MD   [facility]    NEW PATIENT VISIT   2:00 PM   (30 min.)   Hendershot, Ashley, NP   [facility] Hereditary Risk Screen 9     10     11       12     13'$     NM BONE SCAN WHOLE BODY  INJ  12:00 PM   (15 min.)   RIS, RR NM INJ (RRNMINJ)   [facility] [place]   3:00 PM   (60 min.)   RIS, RR NM3 (RRNM3)   [facility] 14     15    NEW PATIENT VISIT   2:00 PM   (30 min.)   Reginia Naas, NP   Division of Plastic Surgery    CT ABD PELVIS WITH CONTRAST   5:50 PM   (20 min.)   RIS, RR CT1 (RRCT1)   [facility] 16     17    NEW PATIENT VISIT   1:00 PM   (60 min.)   Beverely Pace, MD   [facility] $RemoveBeforeDE'18       19     20     21    'XtnmvPaYKBloiLj$ RIS BI MR 60   4:30 PM   (60 min.)   RIS, RR MR1 (RRMR1)   [facility] $RemoveB'22     23     24     25       26     27     28     29     30                           'ftIwyWOm$ July 2022      'Sunday Monday Tuesday Wednesday Thursday Friday Saturday                            1     2       3     4     5     6     7     8     9       10     11    NEW PATIENT VISIT  10:00 AM   (60 min.)   Mallaber, Patricia Renee, NP   [facility] 12     13     14     15     16       17      18     19     20     21     22     23       24     25     26     27     28     29     30       31'$                                                August 2022      'Sunday Monday Tuesday Wednesday Thursday Friday Saturday'$         '1     2     3     4     5     6       7     8     '$ 9  _0 31

## 2020-06-24 NOTE — Progress Notes (Signed)
ECOG Performance 1    Intermittent (L) breast pain.      ROS:  Appetite normal yes  Weight change yes - up 15-20 # in past 3 months  Fever   no  Night sweats  no - ++ hot flashes  Fatigue  yes - moderate  Bleeding   no  Cough    no  Dyspnea  no  Chest pain  no  Palpitations  no  Nausea  yes -  Takes Zofran every morning to prevent nausea  Vomiting  no  Dysphagia  no  Mucositis  no  Diarrhea  no  Constipation  no  Abdominal pain no  Urinary symptoms no  Back pain  no  Joint pain  yes - (L) hip x past 5 months.  PCP aware.  Cortisone in the past with relief.  Recent injection not helpful.  Ibuprofen bid with relief.    Bone pain  no  Leg/arm swelling no  Headache  no  Mental status change no  Motor weakness yes - (L) leg weakness d/t hip pain.    Sensory change no  Gait disturbance Yes - d/t (L) hip pain.    Rash    no    LMP:  MMG:  GYN:  DEXA:  COLONOSCOPY:

## 2020-06-24 NOTE — Telephone Encounter (Signed)
Valium sent to pharmacy 

## 2020-06-24 NOTE — H&P (Signed)
06/24/2020     Minus Liberty, MD  [address]  [place],  [place] [address]    RE: Carly Higgins, Rozas  DOB: Jun 17, 1960  Chart: [id number]    Dear Carly Higgins:    I had the pleasure of seeing Deniqua Perry for recommendations regarding management of newly diagnosed left breast cancer.    As you know, Ms. 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  In February 2022 she began to experience discomfort in the left breast.  She first attributed this to having been struck with a part of her dog when she picked it up about 1 week earlier.  She continued to experience discomfort intermittently.  She was in [place] at the time and did not seek any evaluation.      When she returned to [place] she scheduled a screening mammogram, having not had an exam since July 2018.  This was performed on May 18, 2020 and revealed an area of architectural distortion in the middle third of the left breast upper outer quadrant at the 2 o'clock position.    On Jun 07, 2020 diagnostic mammogram and breast ultrasound was performed.  The mammogram revealed scattered areas of fibroglandular densities with an area of architectural distortion again noted at the 2 o'clock position of the left breast.  Breast ultrasound revealed an irregular mass measuring 19 x 19 x 12 mm in the middle third upper outer quadrant of the left breast.  Internal echotexture was hypoechoic and there was internal vascularity.      Based on these findings, ultrasound-guided core biopsy of the left breast 2:00 lesion was performed on June 09, 2020.  Biopsy revealed invasive ductal carcinoma.  Provisional combined Nottingham grade was 3 of 3 (tubule formation score 3, nuclear grade 3, mitotic rate score 2).  Tumor cells were negative for estrogen receptors and progesterone receptors.  Tumor cells were negative for HER2 by immunohistochemical staining with a  staining score of 1+.  Ki-67 staining was seen in approximately 70% of tumor cells.    She was seen by you for surgical consultation at which time she was noted to have a masslike area at the 2 o'clock position of the left breast which felt like a hematoma.  Staging scans were requested.    She was seen in the Hereditary Oncology clinic on June 13, 2020.  Because of her prior allogeneic bone marrow transplant, she was not eligible for standard germline testing with blood or saliva specimen.      Whole-body bone scan performed on June 20, 2020 revealed no abnormal focus of radiotracer uptake to suggest osseous metastatic disease.    CT of the chest performed with contrast on June 22, 2020 revealed an ill-defined enhancing lesion in the lateral left breast measuring 1.7 x 1.2 x 1.1 cm.  There were asymmetrically prominent level 1 left axillary lymph nodes that demonstrated enhancement of uncertain significance.  These were felt possibly to represent metastatic disease.  There were no enlarged mediastinal, hilar, intramammary or supraclavicular lymph nodes.  No suspicious pulmonary nodules were identified.  CT of the abdomen pelvis performed at the same time revealed no evidence of metastatic disease within the abdomen and pelvis.  A small fat filled umbilical hernia was noted.    She reports that she is scheduled for breast MRI on June 28, 2020.    PAST MEDICAL/SURGICAL HISTORY:   1.  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  Prior to bone marrow transplant she received 5 cycles of decitabine.  She received fludarabine, busulfan and and hydroxyurea for the transplant and received methotrexate for graft-versus-host prophylaxis.  2. Hypothyroid.  3. Anxiety.  4. Osteoarthritis of the hips, left greater than right.  5. Mild diverticulosis of the sigmoid colon.  6. History of atrial fibrillation and  congestive heart failure in 2017 related to the bone marrow transplant.  7. Right cataract surgery.  8. Right rotator cuff surgery.    MEDICATIONS:      Current Outpatient Medications   Medication Sig    acyclovir (ZOVIRAX) 400 mg tablet Take 1 tablet (400 mg total) by mouth 2 times daily    metoprolol tartrate (LOPRESSOR) 50 mg tablet Take 1 tablet (50 mg total) by mouth 2 times daily    ondansetron (ZOFRAN) 4 mg tablet Take 1 tablet (4 mg total) by mouth 3 times daily as needed (nausea)    cholecalciferol (VITAMIN D) 50 MCG (2000 UT) tablet Take 1,000 Units by mouth daily    levothyroxine (SYNTHROID, LEVOTHROID) 50 MCG tablet Take 50 mcg by mouth daily (before breakfast)    escitalopram (LEXAPRO) 20 MG tablet Take 20 mg by mouth daily    aspirin 81 MG EC tablet Take 81 mg by mouth daily       ALLERGIES:  Allergies: No Known Allergies (drug, envir, food or latex)    SOCIAL HISTORY: She is divorced.  She lives alone in an apartment.  She has stairs in an elevator.  She has no children.  She is not presently working.  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  She has smoked cigarettes for approximately 45 years.  She stopped for several years following the bone marrow transplant but resumed smoking in 2019.  She averages 1/2 pack/day.  She drinks wine or mixed drinks several times per week.  She does not use other recreational drugs.  She has 1 pet dog.    FAMILY HISTORY: Her father had stage I throat cancer treated with radiation.  He was a smoker.  He died at the age of 54.  His death was not related to cancer.  Her mother is 31 and in good health.  Her sister has a benign tumor involving the spine.  Her brother has a benign growth of the throat.  There is no family history of breast cancer or ovarian cancer.  Her maternal grandmother had psoriatic arthritis.  A paternal aunt had Alzheimer's disease.  There is no family history of diabetes  mellitus or venous thromboembolic events.  Her maternal grandfather had heart disease.    REVIEW OF SYSTEMS:  Constitutional: She considers her weight to be her biggest health issue prior to the diagnosis of breast cancer.  Appetite is good.  She has gained 15 to 20 pounds in the past 3 months which she attributes to stress.  She denies recent fevers or symptoms of infection.  She denies night sweats.  She has occasional hot flashes, especially in the hot weather.  She has moderate fatigue and states she is not as active secondary to chronic left hip pain related to osteoarthritis.  She has received 3 doses of the Pfizer COVID-19 vaccine.  She does not believe that she has had a COVID-19 infection.   HEENT:  Vision is good.  She has had a stye on her right eyes since she went on a cruise in March.  She wears reading glasses.  She has floaters in her right eye and believes she has a cataract surgery on the right eye.  Hearing is good.  She denies frequent mouth sores, gingival bleeding or epistaxis.  She had a bottom left tooth removed last year.  Otherwise she has had no dental problems and sees a dentist on a regular basis for routine dental care.  Hematologic:  She denies excessive bleeding or bruising.  She is unaware of peripheral lymph enlargement.  Respiratory: She denies respiratory symptoms including cough and dyspnea.  Cardiovascular:  She has occasional palpitations with anxiety attacks.  With this she often feels lightheaded.  Otherwise she has no cardiac symptoms including exertional chest pain.  She denies swelling of her extremities.   Gastrointestinal: She takes ondansetron every morning to prevent nausea.  She reports that she has not tried going without ondansetron to see if she still needs this.  She denies vomiting.  She denies dysphagia.  She denies heartburn or indigestion.  She has normal bowel movements without diarrhea or constipation.  She denies abdominal pain or bloating.  Her last  colonoscopy was on May 09, 2017 at which time a hyperplastic polyp was removed from the sigmoid colon.   Genitourinary:  She has no urinary symptoms including frequency and dysuria.  Gynecologic:  She has no gynecologic problems.  She has not had a GYN exam in quite some time.  Her last menstrual period was at age 13.  She has never been on hormone replacement therapy.   Musculoskeletal: She has been experiencing left hip pain for the past 5 months.  She has undergone evaluation for this and x-rays revealed osteoarthritis.  She has received cortisone injections in the past with relief.  Recent injection was not as helpful.  She is using ibuprofen 600 mg twice a day with relief.  She also has right hip pain to a lesser extent.  Otherwise she has no musculoskeletal symptoms including back pain and bone pain.  She has never had a bone density scan.  Neurologic: She denies frequent headaches.  She reports that she is forgetful at times and has noted that her memory has not been as good since undergoing bone marrow transplant.  She feels that her left leg is weaker than her right due to the arthritis in the hip.  Otherwise she has no focal motor symptoms but has generalized decreased strength.  She denies sensory symptoms including numbness and tingling in her extremities.  She denies problems with balance and coordination.   Dermatologic: She has no active skin problems.   Psych:  She has chronic anxiety for which she is on escitalopram.  She reports occasional symptoms of depression.    PHYSICAL EXAMINATION:      Vitals:    06/24/20 1306   BP: 125/80   Pulse: 65   Temp: 36.6 C (97.9 F)   TempSrc: Temporal   SpO2: 95% on room air    Weight: 119.2 kg (262 lb 12.6 oz)   Height: 167.8 cm (5' 6.06")        General:  Slightly overweight but otherwise well-appearing, well-nourished woman.  ECOG performance status is 0.    HEENT:  Without gross external abnormality.  There is no obvious stye on her right eye.  Sclerae are  anicteric.  Conjunctivae are pink.  Pupils are equal, round  and reactive to light.  Extraocular movements are intact.  Oral mucosa is without obvious abnormality.  Oropharynx is unremarkable. She is missing the second to last molar on the lower left.  She has good dentition.   Neck:  Without palpable mass or thyromegaly.  No bruits are appreciated.    Lymph Nodes:  No appreciable cervical, supraclavicular, axillary, or inguinal lymph node enlargement, including the left axilla.   Lungs: Clear to auscultation and percussion.  Cardiovascular: Regular rate and rhythm.  No murmur is noted.  Chest: There is a scar at the base of the neck and upper chest on the right side from a previous central line.  Breasts: Right breast is without abnormality.  Left breast has a palpable mass in the upper outer quadrant at approximately 2 o'clock position.  Mass is mobile and measures approximately 4 cm.  There is a resolving ecchymosis in the overlying skin.  There is no obvious skin involvement and no edema is present.  Abdomen:  Abdomen is flat.  Bowel sounds are normoactive.  There is no tenderness to palpation. There is no hepatosplenomegaly, mass, or ascites appreciated by palpation and percussion.  Musculoskeletal:  There is no tenderness to palpation along the spine.  Extremities:   Without clubbing, cyanosis, or edema.    Neurologic: Grossly nonfocal.  The patient is alert and oriented x3.  Cranial nerves II through XII appear grossly intact.  There is no focal motor deficit.  Biceps reflexes are 1+ and symmetric.  Knee jerk reflexes are difficult to elicit.  Skin:  Without obvious rash, petechiae, or ecchymoses.  There is a raised mole on her back on the right.  There are patches of vitiligo in the upper abdomen.  Multiple actinic keratoses are noted on her legs.    LABORATORY DATA: May 19, 2020 white blood count was 7.7 with absolute neutrophil count 4200, absolute lymphocyte count 2700, hematocrit 44% with hemoglobin 14.2,  MCV 92 and platelet count 260,000.  Serum chemistries including liver enzymes were within normal limits except for a mildly elevated potassium of 5.3 and glucose of 114.  Creatinine was 0.78, total bilirubin 0.5, alkaline phosphatase 99, albumin 4.5 and calcium 9.7.  LDH was normal at 184.  Ferritin was elevated at 429 and TSH was 2.90.  IgG level was low normal at 801 mg/dL.    IMAGING DATA: Breast imaging, bone scan and CT of the chest, abdomen and pelvis results as noted above.    IMPRESSION: Ms. 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  I have spent more than 70 minutes with her reviewing the diagnosis, prognosis, management options and answering questions.    It was explained to her that triple negative breast cancer carries a higher risk for spreading to sites outside of the breast.  Fortunately staging scans did not show evidence of distant metastatic disease.  The significance of the asymmetric left axillary lymph nodes seen on CT is unclear.  MRI made better evaluate these lymph nodes.      The role of systemic chemotherapy was reviewed.  It was explained the use of chemotherapy either after surgery or prior to surgery has been demonstrated to reduce the risk of developing a distant recurrence by approximately 30 to 40%, resulting in a comparable reduction in risk of dying from breast cancer.  I explained to her that given the features of her tumor,  neoadjuvant chemotherapy is often considered.  Her bone marrow transplant may complicate treatment.  I would like to talk to [name] to get her input regarding the status of Ms. Tellado's bone marrow before deciding on a treatment plan.     It was explained to her at our goal for treatment is to cure her breast cancer.    I have encouraged her to try to cut back on her cigarette smoking, preferably stopping.    RECOMMEND:       MRI of  breasts on July 28, 2020     Chemotherapy either before (preferred) or after surgery. Regimen to be used will be determined after discussion with [name].     Echocardiogram in the next 10 days to assess cardiac function prior to starting chemotherapy.     Follow up in 1 week to finalize plan regarding chemotherapy.     Due to concerns regarding COVID-19, a facemask was worn by myself as well as the patient throughout the office visit.    I personally spent more than 80 minutes on the calendar day of the encounter, including pre and post visit work as well as time spent directly with the patient reviewing the diagnosis, prognosis, management options and answering questions.    I thank you for allowing me the opportunity to take part in the care of this pleasant woman.    Sincerely,        Mike Craze. Isaiah Serge, MD    xc: Erin Sons, MD   Minus Liberty, MD   Assunta Curtis, MD

## 2020-06-24 NOTE — Telephone Encounter (Signed)
Called pt to notify that prescription was sent to her pharmacy. Pt reports understanding.

## 2020-06-28 ENCOUNTER — Ambulatory Visit
Admission: RE | Admit: 2020-06-28 | Discharge: 2020-06-28 | Disposition: A | Discharge status: Home / Self Care | Payer: Medicare Other | Source: Ambulatory Visit | Attending: Surgical Oncology | Admitting: Surgical Oncology

## 2020-06-28 DIAGNOSIS — Z803 Family history of malignant neoplasm of breast: Secondary | ICD-10-CM | POA: Insufficient documentation

## 2020-06-28 DIAGNOSIS — C50912 Malignant neoplasm of unspecified site of left female breast: Secondary | ICD-10-CM

## 2020-06-28 DIAGNOSIS — C50412 Malignant neoplasm of upper-outer quadrant of left female breast: Secondary | ICD-10-CM

## 2020-06-28 MED ORDER — GADOTERIDOL 279.3 MG/ML (PROHANCE) IV SOLN *I*
24.0000 mL | Freq: Once | INTRAVENOUS | Status: AC
Start: 2020-06-28 — End: 2020-06-28
  Administered 2020-06-28: 24 mL via INTRAVENOUS

## 2020-06-28 NOTE — Progress Notes (Signed)
Department of Imaging Sciences  Outpatient Self-Administered Sedative in MRI   Intake Note      Date of Exam:06/28/2020  Carly Higgins  [id number]  Type of Exam:  MRI    Patient self administered sedative for todays exam as prescribed by Provider: Ernesto Rutherford PA   Sedative taken (name): Diazepam Dose: 5 mg  Time sedative taken: 1600  Patient has driver: Yes  Name of driver: Carly Higgins   Drivers location and/or contact number: [phone]  If no driver available, Radiologist notified: N/A  Name of Radiologist: NA    Transportation arrangements made by Imaging site: N/A  Transportation Service reserved:NA    Comments: NA

## 2020-06-29 ENCOUNTER — Encounter: Payer: Self-pay | Admitting: Hematology and Oncology

## 2020-06-29 ENCOUNTER — Telehealth: Payer: Self-pay | Admitting: Hematology and Oncology

## 2020-06-29 ENCOUNTER — Telehealth: Payer: Self-pay | Admitting: Surgical Oncology

## 2020-06-29 NOTE — Telephone Encounter (Signed)
Reviewed MRI results with patient, which showed known left breast cancer and no evidence of disease in her right breast. Patient has a new patient appointment with Dr. Isaiah Serge this week

## 2020-06-29 NOTE — Telephone Encounter (Signed)
error 

## 2020-06-29 NOTE — Telephone Encounter (Signed)
Pt. Calling to speak with the team in regards to Pt. MRI results.

## 2020-06-29 NOTE — Telephone Encounter (Signed)
Asking for a call back to go over MRI results

## 2020-06-30 ENCOUNTER — Other Ambulatory Visit: Payer: Self-pay | Admitting: Hematology and Oncology

## 2020-06-30 ENCOUNTER — Other Ambulatory Visit: Payer: No Typology Code available for payment source

## 2020-07-01 ENCOUNTER — Telehealth: Payer: Self-pay

## 2020-07-01 ENCOUNTER — Ambulatory Visit: Payer: No Typology Code available for payment source | Admitting: Hematology and Oncology

## 2020-07-01 ENCOUNTER — Telehealth: Payer: Self-pay | Admitting: Hematology and Oncology

## 2020-07-01 DIAGNOSIS — C50412 Malignant neoplasm of upper-outer quadrant of left female breast: Secondary | ICD-10-CM

## 2020-07-01 DIAGNOSIS — Z171 Estrogen receptor negative status [ER-]: Secondary | ICD-10-CM

## 2020-07-01 NOTE — Telephone Encounter (Signed)
Spoke with patient in regards to [place] I scheduled for 6/27.

## 2020-07-01 NOTE — Telephone Encounter (Signed)
I contacted the patient regarding the breast MRI results from earlier this week.   I had previously reached out to Dr. Honor Junes who read the MRI and inquired as to whether the left axillary lymph nodes were evaluated since there was some prominence of these lymph nodes seen on CT.  Dr. Honor Junes indicated that the lymph nodes did not appear abnormal by MRI.  She suggested targeted ultrasound of the left axilla to assess further.  I explained to Carly Higgins that it is important to know the status of her lymph nodes prior to starting neoadjuvant chemotherapy.  She is agreeable to undergoing the targeted ultrasound with the plan for biopsy should the lymph nodes appear abnormal.    I also informed Carly Higgins that I have reached out to [name] regarding the use of chemotherapy in the setting of previous allogenic bone marrow transplant.  [name] felt that Carly Higgins bone marrow was fully engrafted and that she should be able to tolerate chemotherapy.  I again explained Carly Higgins that neoadjuvant chemotherapy would be preferred in the setting, especially if she were found to have node positive disease.  I remain reluctant to utilize immunotherapy as part of the treatment given her prior allogeneic bone marrow transplant.    Carly Higgins was scheduled for a follow-up appointment today.  We will reschedule with this to be done after the ultrasound of the axilla.  She is scheduled for echocardiogram on July 05, 2020.    [name], MD

## 2020-07-04 ENCOUNTER — Other Ambulatory Visit: Payer: Self-pay | Admitting: Hematology and Oncology

## 2020-07-04 ENCOUNTER — Telehealth: Payer: Self-pay | Admitting: Physician Assistant

## 2020-07-04 ENCOUNTER — Ambulatory Visit
Admission: RE | Admit: 2020-07-04 | Discharge: 2020-07-04 | Disposition: A | Discharge status: Home / Self Care | Payer: Medicare Other | Source: Ambulatory Visit | Attending: Hematology and Oncology | Admitting: Hematology and Oncology

## 2020-07-04 DIAGNOSIS — Z171 Estrogen receptor negative status [ER-]: Secondary | ICD-10-CM | POA: Insufficient documentation

## 2020-07-04 DIAGNOSIS — C50912 Malignant neoplasm of unspecified site of left female breast: Secondary | ICD-10-CM | POA: Insufficient documentation

## 2020-07-04 DIAGNOSIS — C50412 Malignant neoplasm of upper-outer quadrant of left female breast: Secondary | ICD-10-CM | POA: Insufficient documentation

## 2020-07-04 DIAGNOSIS — R590 Localized enlarged lymph nodes: Secondary | ICD-10-CM | POA: Insufficient documentation

## 2020-07-04 NOTE — Telephone Encounter (Signed)
pt is having a left lymph node biopsy procedureon 07/05/20

## 2020-07-05 ENCOUNTER — Ambulatory Visit
Admission: RE | Admit: 2020-07-05 | Discharge: 2020-07-05 | Disposition: A | Discharge status: Home / Self Care | Payer: Medicare Other | Source: Ambulatory Visit

## 2020-07-05 ENCOUNTER — Ambulatory Visit
Admission: RE | Admit: 2020-07-05 | Discharge: 2020-07-05 | Disposition: A | Discharge status: Home / Self Care | Payer: Medicare Other | Source: Ambulatory Visit | Attending: Radiology | Admitting: Radiology

## 2020-07-05 ENCOUNTER — Ambulatory Visit: Payer: Medicare Other

## 2020-07-05 DIAGNOSIS — Z01818 Encounter for other preprocedural examination: Secondary | ICD-10-CM | POA: Insufficient documentation

## 2020-07-05 DIAGNOSIS — C50412 Malignant neoplasm of upper-outer quadrant of left female breast: Secondary | ICD-10-CM

## 2020-07-05 DIAGNOSIS — I083 Combined rheumatic disorders of mitral, aortic and tricuspid valves: Secondary | ICD-10-CM

## 2020-07-05 DIAGNOSIS — Z79899 Other long term (current) drug therapy: Secondary | ICD-10-CM

## 2020-07-05 DIAGNOSIS — R590 Localized enlarged lymph nodes: Secondary | ICD-10-CM

## 2020-07-05 DIAGNOSIS — Z171 Estrogen receptor negative status [ER-]: Secondary | ICD-10-CM

## 2020-07-05 LAB — ECHO COMPLETE
AV Area (LV SV Mtd): 1.81 cm2
AV Area (LV SV) BSA Index: 0.77 cm2/m2
AV Area (LVOT SR Mtd): 1.87 cm2
AV Area (LVOT SV Mtd): 1.65 cm2
AV CWD VTI: 41.4 cm
AV CWD Velocity (Mean): 145.2 cm/s
AV CWD Velocity (Peak): 206.7 cm/s
AV Gradient (mean): 6.9 mmHg
AV Gradient (peak): 11 mmHg
Aortic Diameter (mid tubular): 3.6 cm
Aortic Diameter (sinus of Valsalva): 3.1 cm
BMI: 42.7 kg/m2
BP Diastolic: 67 mmHg
BP Systolic: 124 mmHg
BSA: 2.36 m2
Deceleration Time - MV: 281 ms
E/A ratio: 0.58
Heart Rate: 70 {beats}/min
Height: 66 in
IVC Diameter: 1.4 cm
LA Diameter BSA Index: 1.7 cm/m2
LA Diameter Height Index: 2.3 cm/m
LA Diameter: 3.9 cm
LA Systolic Vol BSA Index: 26.7 mL/m2
LA Systolic Vol Height Index: 37.6 mL/m
LA Systolic Volume: 63 mL
LV ASE Mass BSA Index: 65.2 gm/m2
LV ASE Mass Height 2.7 Index: 38.1 gm/m2.7
LV ASE Mass Height Index: 91.7 gm/m
LV ASE Mass: 153.8 gm
LV CO BSA Index: 2.22 L/min/m2
LV Cardiac Output: 5.25 L/min
LV Diastolic Volume Index: 48.7 mL/m2
LV Isovolumic Relaxation Time: 83.7 ms
LV Posterior Wall Thickness: 0.9 cm
LV SV - LVOT SV Diff: 6.86 mL
LV SV - LVOT SV Diff: 9.15 %
LV SV BSA Index: 31.8 mL/m2
LV SV Height Index: 44.7 mL/m
LV Septal Thickness: 0.95 cm
LV Stroke Volume: 75 mL
LV Systolic Volume Index: 16.9 mL/m2
LV wall/cavity ratio: 0.38
LVED Diameter BSA Index: 2.04 cm/m2
LVED Diameter Height Index: 2.87 cm/m
LVED Diameter: 4.81 cm
LVED Volume BSA Index: 48.7 mL/m2
LVED Volume BSA Index: 49 ml/m2
LVED Volume Height Index: 68.6 mL/m
LVED Volume: 115 mL
LVEF (Volume): 65 %
LVES Volume BSA Index: 16.9 mL/m2
LVES Volume BSA Index: 17 ml/m2
LVES Volume Height Index: 23.9 mL/m
LVES Volume: 40 mL
LVOT + AV Gradient (mean): 9.6 mmHg
LVOT + AV Gradient (peak): 17.1 mmHg
LVOT Area (calculated): 3.14 cm2
LVOT Cardiac Index: 2.02 L/min/m2
LVOT Cardiac Output: 4.77 L/min
LVOT Diameter: 2 cm
LVOT PWD VTI: 21.7 cm
LVOT PWD Velocity (mean): 82 cm/s
LVOT PWD Velocity (peak): 123 cm/s
LVOT SV BSA Index: 28.87 mL/m2
LVOT SV Height Index: 40.6 mL/m
LVOT Stroke Rate (mean): 257.5 mL/s
LVOT Stroke Rate (peak): 386.2 mL/s
LVOT Stroke Volume: 68.14 cc
LVOT/AV Velocity Ratio: 0.6
MR Regurgitant Fraction (LV SV Mtd): 0.09
MR Regurgitant Volume (LV SV Mtd): 6.9 mL
MV Peak A Velocity: 72.2 cm/s
MV Peak E Velocity: 41.9 cm/s
Mitral Annular E/Ea Vel Ratio: 7.52
Mitral Annular Ea Velocity: 5.57 cm/s
RR Interval: 857.14 ms
SEM (LVOT Mean) mN-s: 58.7 mN-s
SEM (LVOT peak) mN-s: 88.05 mN-s
Tricuspid Annular S velocity: 14 cm/s
Weight (lbs): 264 [lb_av]
Weight: 4224 oz

## 2020-07-05 MED ORDER — PERFLUTREN PROTEIN A MICROSPH (OPTISON) IV SUSP *I*
1.5000 mL | INTRAVENOUS | Status: AC | PRN
Start: 2020-07-05 — End: 2020-07-05
  Administered 2020-07-05: 1.5 mL via INTRAVENOUS

## 2020-07-05 NOTE — Discharge Instructions (Signed)
Discharge Instructions    Rest today. No heavy lifting, strenuous exercise. Resume routine activity tomorrow.  Resume your normal diet.  Avoid any over the counter medications that contain aspirin or nonsteroidal anti-inflammatory agents (examples: ibuprofen, Naprosyn) for 24 hours.   You may use Tylenol (Acetaminophen).  Use an ice pack over your incision site for 10-15 minutes every 2 hours for the rest of today.   Keep dressing dry  Keep your incision dry for 24 hours  Remove your dressing after 24 hours.  Sponge bath or tub bath- do not get dressing wet for 1st 24 hours  You may take a bath/shower after 24 hours  Keep steri-strips on for 3-4 days or until they fall off    If you have any problems related to your biopsy:  Before 4:00 PM, Please [phone] ([place] location)    Go to the nearest Emergency ([place], [place], [place],   [place]) if it is after 4 PM and you have:  Severe PAIN not relieved with pain medication   Swelling and/or Drainage at the incision site  Heavy bleeding that will not stop after holding compression for 15 minutes  Fever over 101 degrees F/ 38.4 degrees C

## 2020-07-06 ENCOUNTER — Telehealth: Payer: Self-pay

## 2020-07-06 ENCOUNTER — Telehealth: Payer: Self-pay | Admitting: Hematology and Oncology

## 2020-07-06 LAB — SURGICAL PATHOLOGY

## 2020-07-06 NOTE — Telephone Encounter (Signed)
POST PROCEDURE UPDATE CALL    Procedure Date: 07/05/2020     Provider: Dr. Arizona Constable  Procedure:  Left lymph biopsy    Diet: Tolerating well    Activity:Restore normal activities    Dressing/ Condition of incision site:Dry and Intact    Are you having any bleeding or drainage from the site? No    Is there any redness or firmness at the area? No    Are you having pain related to the procedure:Please rate pain on a scale of 0-10:0    Questions/Comments: Spoke with Carly Higgins and gave benign results at the time of call. Told Carly Higgins that no follow up imaging is required, but to follow up with her oncologist. Carly Higgins agreeable with plan. No questions at this time.

## 2020-07-06 NOTE — Telephone Encounter (Signed)
Writer spoke with patient to arrange a start date for her chemotherapy.  Writer offered 07/08/20 or 07/15/20.  Patient states she prefer 7/8 as she will be going away for the July 4th holiday.  Writer confirmed 07/15/20 @ 0930 with Dr. Isaiah Serge with treatment following.  Writer also informed patient that her ECHO was fine and Dr. Isaiah Serge can review it with her at her follow up.  Patient verbalized understanding of all information given.

## 2020-07-06 NOTE — Telephone Encounter (Signed)
Calling to speak with the team in regards to Pt. Echo results.

## 2020-07-08 ENCOUNTER — Other Ambulatory Visit: Payer: Medicare Other

## 2020-07-08 ENCOUNTER — Telehealth: Payer: Self-pay | Admitting: Hematology and Oncology

## 2020-07-08 NOTE — Telephone Encounter (Signed)
Returning missed call

## 2020-07-08 NOTE — Telephone Encounter (Signed)
Spoke with patient. Patient was returning call from [facility]. Didn't know if she was supposed to call back. She was aware of the results she said were mentioned in the voicemail and does not need a call back

## 2020-07-08 NOTE — Telephone Encounter (Signed)
No call made from clinic.  Will forward to front desk team.

## 2020-07-14 ENCOUNTER — Other Ambulatory Visit: Payer: Self-pay | Admitting: Hematology and Oncology

## 2020-07-14 DIAGNOSIS — D471 Chronic myeloproliferative disease: Secondary | ICD-10-CM

## 2020-07-14 DIAGNOSIS — Z171 Estrogen receptor negative status [ER-]: Secondary | ICD-10-CM

## 2020-07-15 ENCOUNTER — Ambulatory Visit: Payer: Medicare Other | Attending: Hematology and Oncology

## 2020-07-15 ENCOUNTER — Other Ambulatory Visit
Admission: RE | Admit: 2020-07-15 | Discharge: 2020-07-15 | Disposition: A | Discharge status: Home / Self Care | Payer: Medicare Other | Source: Ambulatory Visit

## 2020-07-15 ENCOUNTER — Ambulatory Visit: Payer: Medicare Other | Admitting: Hematology and Oncology

## 2020-07-15 VITALS — BP 135/80 | HR 76 | Temp 96.8°F | Wt 266.1 lb

## 2020-07-15 DIAGNOSIS — Z5111 Encounter for antineoplastic chemotherapy: Secondary | ICD-10-CM | POA: Insufficient documentation

## 2020-07-15 DIAGNOSIS — Z171 Estrogen receptor negative status [ER-]: Secondary | ICD-10-CM | POA: Insufficient documentation

## 2020-07-15 DIAGNOSIS — D471 Chronic myeloproliferative disease: Secondary | ICD-10-CM | POA: Insufficient documentation

## 2020-07-15 DIAGNOSIS — C50412 Malignant neoplasm of upper-outer quadrant of left female breast: Secondary | ICD-10-CM

## 2020-07-15 LAB — COMPREHENSIVE METABOLIC PANEL
ALT: 31 U/L (ref 0–35)
AST: 26 U/L (ref 0–35)
Albumin: 4.4 g/dL (ref 3.5–5.2)
Alk Phos: 97 U/L (ref 35–105)
Anion Gap: 15 (ref 7–16)
Bilirubin,Total: 0.4 mg/dL (ref 0.0–1.2)
CO2: 23 mmol/L (ref 20–28)
Calcium: 9.7 mg/dL (ref 8.6–10.2)
Chloride: 103 mmol/L (ref 96–108)
Creatinine: 0.71 mg/dL (ref 0.51–0.95)
Glucose: 168 mg/dL — ABNORMAL HIGH (ref 60–99)
Lab: 20 mg/dL (ref 6–20)
Potassium: 5 mmol/L (ref 3.3–5.1)
Sodium: 141 mmol/L (ref 133–145)
Total Protein: 6.9 g/dL (ref 6.3–7.7)
eGFR BY CREAT: 97 *

## 2020-07-15 LAB — CBC AND DIFFERENTIAL
Baso # K/uL: 0 10*3/uL (ref 0.0–0.1)
Basophil %: 0.5 %
Eos # K/uL: 0.3 10*3/uL (ref 0.0–0.4)
Eosinophil %: 4.5 %
Hematocrit: 43 % (ref 34–45)
Hemoglobin: 14 g/dL (ref 11.2–15.7)
Lymph # K/uL: 2 10*3/uL (ref 1.2–3.7)
Lymphocyte %: 32.7 %
MCH: 31 pg (ref 26–32)
MCHC: 32 g/dL (ref 32–36)
MCV: 95 fL (ref 79–95)
Mono # K/uL: 0.5 10*3/uL (ref 0.2–0.9)
Monocyte %: 7.9 %
Neut # K/uL: 3.4 10*3/uL (ref 1.6–6.1)
Platelets: 215 10*3/uL (ref 160–370)
RBC: 4.6 MIL/uL (ref 3.9–5.2)
RDW: 15.1 % — ABNORMAL HIGH (ref 11.7–14.4)
Seg Neut %: 54.4 %
WBC: 6.2 10*3/uL (ref 4.0–10.0)

## 2020-07-15 LAB — NEUTROPHIL #-INSTRUMENT: Neutrophil #-Instrument: 3.4 10*3/uL

## 2020-07-15 MED ORDER — PALONOSETRON HCL 0.25 MG/5ML IV SOLN *I*
INTRAVENOUS | Status: AC
Start: 2020-07-15 — End: 2020-07-15
  Filled 2020-07-15: qty 5

## 2020-07-15 MED ORDER — ONDANSETRON HCL 8 MG PO TABS *I*
8.0000 mg | ORAL_TABLET | Freq: Three times a day (TID) | ORAL | 5 refills | Status: DC | PRN
Start: 2020-07-15 — End: 2021-01-04

## 2020-07-15 MED ORDER — DIPHENHYDRAMINE HCL 50 MG/ML IJ SOLN *I*
25.0000 mg | Freq: Once | INTRAMUSCULAR | Status: AC
Start: 2020-07-15 — End: 2020-07-15
  Administered 2020-07-15: 25 mg via INTRAVENOUS

## 2020-07-15 MED ORDER — PROCHLORPERAZINE MALEATE 10 MG PO TABS *I*
10.0000 mg | ORAL_TABLET | Freq: Four times a day (QID) | ORAL | 5 refills | Status: DC | PRN
Start: 2020-07-15 — End: 2021-01-24

## 2020-07-15 MED ORDER — DEXTROSE 5 % IV SOLN WRAPPED *I*
225.0000 mg | Freq: Once | INTRAVENOUS | Status: AC
Start: 2020-07-15 — End: 2020-07-15
  Administered 2020-07-15: 225 mg via INTRAVENOUS
  Filled 2020-07-15: qty 22.5

## 2020-07-15 MED ORDER — DEXAMETHASONE SODIUM PHOSPHATE 10 MG/ML IJ SOLN *I*
10.0000 mg | Freq: Once | INTRAMUSCULAR | Status: AC
Start: 2020-07-15 — End: 2020-07-15
  Administered 2020-07-15: 10 mg via INTRAVENOUS

## 2020-07-15 MED ORDER — PROCHLORPERAZINE MALEATE 10 MG PO TABS *I*
10.0000 mg | ORAL_TABLET | Freq: Once | ORAL | Status: AC
Start: 2020-07-15 — End: 2020-07-15
  Administered 2020-07-15: 10 mg via ORAL

## 2020-07-15 MED ORDER — SODIUM CHLORIDE 0.9 % IV SOLN WRAPPED *I*
30.0000 mL/h | Status: DC | PRN
Start: 2020-07-15 — End: 2020-07-15
  Administered 2020-07-15: 30 mL/h
  Administered 2020-07-15: 595 mL/h
  Administered 2020-07-15 (×2): 20 mL/h
  Administered 2020-07-15 (×2): 30 mL/h
  Administered 2020-07-15: 595 mL/h
  Administered 2020-07-15: 100 mL/h
  Administered 2020-07-15: 30 mL/h via INTRAVENOUS
  Administered 2020-07-15: 30 mL/h
  Administered 2020-07-15: 301 mL/h
  Administered 2020-07-15: 100 mL/h
  Administered 2020-07-15: 20 mL/h

## 2020-07-15 MED ORDER — DIPHENHYDRAMINE HCL 50 MG/ML IJ SOLN *I*
INTRAMUSCULAR | Status: AC
Start: 2020-07-15 — End: 2020-07-15
  Filled 2020-07-15: qty 1

## 2020-07-15 MED ORDER — SODIUM CHLORIDE 0.9 % IV SOLN WRAPPED *I*
80.0000 mg/m2 | Freq: Once | INTRAVENOUS | Status: AC
Start: 2020-07-15 — End: 2020-07-15
  Administered 2020-07-15: 187 mg via INTRAVENOUS
  Filled 2020-07-15: qty 31.17

## 2020-07-15 MED ORDER — PALONOSETRON HCL 0.25 MG/5ML IV SOLN *I*
0.2500 mg | Freq: Once | INTRAVENOUS | Status: AC
Start: 2020-07-15 — End: 2020-07-15
  Administered 2020-07-15: 0.25 mg via INTRAVENOUS

## 2020-07-15 MED ORDER — HEPARIN LOCK FLUSH 10 UNIT/ML IJ SOLN WRAPPED *I*
50.0000 [IU] | INTRAVENOUS | Status: DC | PRN
Start: 2020-07-15 — End: 2020-07-15

## 2020-07-15 MED ORDER — FAMOTIDINE (PF) 20 MG/2ML IV SOLN *I*
20.0000 mg | Freq: Once | INTRAVENOUS | Status: AC
Start: 2020-07-15 — End: 2020-07-15
  Administered 2020-07-15: 20 mg via INTRAVENOUS

## 2020-07-15 MED ORDER — FAMOTIDINE (PF) 20 MG/2ML IV SOLN *I*
INTRAVENOUS | Status: AC
Start: 2020-07-15 — End: 2020-07-15
  Filled 2020-07-15: qty 2

## 2020-07-15 MED ORDER — DEXAMETHASONE SOD PHOSPHATE PF 10 MG/ML IJ SOLN *I*
INTRAMUSCULAR | Status: AC
Start: 2020-07-15 — End: 2020-07-15
  Filled 2020-07-15: qty 1

## 2020-07-15 NOTE — Progress Notes (Signed)
ECOG Performance  1    ROS:  Appetite normal yes  Weight change yes - up 1.5 kg  Fever   no  Night sweats  no - mild sweats  Fatigue  yes - moderate  Bleeding   no  Cough    no  Dyspnea  no  Chest pain  no  Palpitations  no  Nausea  no  Vomiting  no  Dysphagia  no  Mucositis  no  Diarrhea  no  Constipation  no  Abdominal pain no  Urinary symptoms no  Back pain  no  Joint pain  yes - (L) hip pain x 5 months.  Patient inquiring if she may receive a cortisone injection.    Bone pain  no  Leg/arm swelling no  Headache  no  Mental status change no  Motor weakness no  Sensory change no  Gait disturbance yes - occasional r/t hip pain.    Rash    no    LMP:  MMG:  GYN:  DEXA:  COLONOSCOPY:

## 2020-07-15 NOTE — Plan of Care (Signed)
START ON PATHWAY REGIMEN - Breast    [id number]        Paclitaxel (Taxol)       Carboplatin       Doxorubicin (Adriamycin)       Cyclophosphamide       Pegfilgrastim-xxxx     **Always confirm dose/schedule in your pharmacy ordering system**    Patient Characteristics:  Preoperative or Nonsurgical Candidate (Clinical Staging), Neoadjuvant Therapy followed by Surgery, Invasive Disease, Chemotherapy, HER2 Negative/Unknown/Equivocal, ER Negative/Unknown, Platinum Therapy Indicated and Not a Candidate for Checkpoint Inhibitor  Therapeutic Status: Preoperative or Nonsurgical Candidate (Clinical Staging)  AJCC M Category: cM0  AJCC Grade: G3  Breast Surgical Plan: Neoadjuvant Therapy followed by Surgery  ER Status: Negative (-)  AJCC 8 Stage Grouping: IIB  HER2 Status: Negative (-)  AJCC T Category: cT2  AJCC N Category: cN0  PR Status: Negative (-)  Intent of Therapy:  Curative Intent, Discussed with Patient

## 2020-07-15 NOTE — Progress Notes (Addendum)
[place] TREATMENT HAND-OFF TOOL:   SITUATION:   Scheduled treatment category for today:     Cancer treatment:  Other    Others:  Patient scheduled for Taxol / Carbo    Is this a new cancer treatment?: No      Patient seen by MD    Consent obtained:  Yes    Location:  To be scanned    Labs complete:  Yes    Within parameters: Yes      Current patient status:  Scheduled treatment    OK to treat for scheduled treatment:  Yes    Patient is okay to start cycle 1 of paclitaxel and carboplatin today (07/15/2020).  [name], MD

## 2020-07-15 NOTE — Plan of Care (Addendum)
ALERT: Recent Pathways Treatment decision is outdated. Please await next Pathways decision 

## 2020-07-15 NOTE — Patient Instructions (Signed)
Caring For Yourself After Chemotherapy    General Tips     Stay active!  It is important to continue doing the activities that interest you.      Keep eating! Eat high fiber foods such as green leafy vegetables and whole grain breads. Unless you have been told otherwise, PLEASE remember to eat on your scheduled treatment days. Eating a light meal before treatments is recommended. Feel free to bring your own food to the infusion center.   Avoid fatty, fried, greasy, and spicy foods on the day of treatment.      Drinking fluids is very important for your kidneys. You should drink at least eight (8 ounce) glasses of non-caffeinated liquids each day.  Some therapies require larger amounts of daily fluid intake; your health care team will tell you if more liquids are needed with your particular chemotherapy. Avoid juices and foods that have a high acid content such as orange, grapefruit juice, tomato juice, etc.     Beer, wine and hard liquor may interfere with your treatment. Talk to your cancer care team to see if it is okay for you to drink alcohol.     Take your anti-nausea medication as directed. You should take your prescribed medicine as soon as you feel queasy to avoid vomiting. If the nausea medication is not effectively working, call your cancer care team for direction.     Some over the counter medications, supplements and herbs may interfere with the other medicines you are using. Please bring a list of what you are taking to your appointments and check with your cancer care team before taking any over the counter medicines, supplements, or herbs.    The medical oncology team at [facility] is here to help you. If you have any questions about your symptoms or treatment please ask [place]. You can call and talk to your nurse if you have questions or concerns any time during business hours.  You may choose to leave a message for your cancer care team and they will call you back. We always have someone  on-call after business hours, and on weekends for emergencies. We are here to help you.      The following lists are some common side effects related to chemotherapy treatment and how to care for yourself during this time. Your nurse will tell you which side effects you are more likely to have because of the treatment you are getting.    Important:    Please call your oncologist's office IMMEDIATELY if you have any of these side effects:    Fever greater than or equal to 38.0 C or 100.4 F   Shaking chills    Nosebleed that won't stop   Chest pain   Shortness of breath or trouble breathing   Nausea (sick to your stomach) or vomiting (throwing up) that prevents you from eating for 24 hours or more   Signs of infection:  - Burning sensation with urination (peeing)  - Rash, with itching skin  - New or worsening cough  - Sore throat  - Redness, pain or tenderness, drainage from the skin, mediport or IV site      Infection:    Because chemotherapy may change the way your immune system works, it is important to prevent infections by following these steps:   Wash your hands often, especially before eating and after using the restroom.   Take baths or showers often. Use lotions to keep your skin moist, to help  often. Use lotions to keep your skin moist, to help prevent drying or cracking skin. If radiation therapy is part of your treatment, please speak with your radiation nurse before using any lotions on the areas where you are getting radiation.   Wear gloves when gardening or cleaning up after pets to avoid cuts, scrapes and contact with animal germs   Talk with your oncologist before getting any dental work. If they say it is okay to get the dental work, be sure to tell your dentist you are getting chemotherapy.   Avoid people with known infections or colds.   Talk with your doctor before receiving any immunizations (shots to prevent disease).   Be cautious with the use of acetaminophen (Tylenol) as this may hide a fever   Avoid salon manicures and  pedicures while on treatment   Avoid large crowds, especially  if you feel weak or have a cold.    Bleeding:     Talk to your cancer care team before taking pain medicines like aspirin, Advil, Motrin, ibuprofen or naproxen (Aleve) as these can put you at risk for bleeding.    Avoid contact sports.   Avoid becoming constipated (unable to move your bowels) since this can cause straining and increase your risk for rectal bleeding.    If you have any of the following side effects, call your doctors office immediately:     Nose bleeds or bleeding gums   Bruising without a known cause   Blood in your urine or stool   Petechiae or red dots under your skin    Low Red blood Cell Count:     Red blood cells carry oxygen to the cells of your body. When your red blood cells are low you are anemic.     If you have any of the following side effects, call your doctors office:     Fast heart rate   Chest pain   Feeling that it is hard to catch your breath   Dizziness, especially when getting out of bed or up from a chair   Feeling very tired or weak    Having trouble staying warm    Nausea or Vomiting:     Eat small frequent meals. Bland foods are best, avoiding spicy or fried foods   Eat foods at room temperature or slightly cooler to decrease smells that can make you nauseous (feel sick to your stomach)   Rest after meals, keeping your head high   Eat a small amount on treatment days   If your doctor orders anti-nausea medicine, please carefully read the doctors instructions     Contact your doctors office if you experience:     Nausea and/or vomiting unrelieved by anti-nausea medications   Nausea and/or vomiting that prevents you from eating or drinking for 24 hours or more    Diarrhea:    Diarrhea is an increase in number of bowel movements (stools) compared to normal; stool may be watery.     Eat a low fiber diet, including bananas, white rice, applesauce, toast, mashed potatoes, skinless [place] or  chicken, fish and well-cooked eggs   Eat small meals and drink lots of fluid (at least eight glasses, 8 ounces each)   If your doctor orders anti-diarrhea medicine, please carefully read the doctors instructions.   Avoid milk and milk products, raw fruits and vegetables, greasy, fried foods, alcohol, substances containing sorbitol (sugar free candy or gum)    If you have any of  the following side effects, call your doctors office:     Diarrhea 3 times or more a day      Constipation:    Constipation means that a person has three or fewer bowel movements (stool, poop) in a week. The stool can be hard and dry. Sometimes it is painful to pass     Eat high fiber foods such as fruits (prune juice), vegetables, whole grains etc   Regular exercise, especially walking, is a good way to ease constipation. Before beginning any new activities, talk with your cancer care team about the right exercise plan for you   If your doctor orders laxative or stool-softener medicine, please carefully read the doctors instructions.    If you have any of the following side effects, call your doctors office:     If you have not had a bowel movement in 2 days    Nutrition:     Prepare meals before chemotherapy days so that you always have something nutritious ready    Eat a third (1/3) of your daily calories for breakfast since your appetite will usually decrease as the day goes on.   If you are not hungry or become full after eating a small amount, having small meals many times during the day can help you get the nutrition you need.   Light exercise may help increase your appetite, such as walking      day goes onEating foods from the 4 basic foods groups (fruits/vegetables, meats, grains and dairy) can help you get a well-balanced diet.    If you experience a bitter or metallic taste in your mouth try using plastic utensils. Sucking on hard candy or chewing gum may also be helpfulcan help.   Use nutritional supplements  (like protein powder or a high calorie drink) to make sure you are getting enough calories when your appetite is poor.   Nutrition During Chemotherapy video available at https://youtu.be/nCjQt6zmPKA or scan QR code with your smartphone camera:         Mouth Sores (Mucositis):     Use a soft toothbrush and brush gently to avoid damaging the soft tissue in your mouth. Replace your toothbrush every 3 months or as needed   Inspect your mouth daily for open areas   Avoid mouth washes that contain alcohol such as Scope or Listerine, as these can irritate and/or dry your mouth. Biotene is an alcohol free mouth wash that can be purchased over the counter or ask your dentist for alternatives.   Use a baking soda rinse four times daily;  teaspoon baking soda, 1/8 teaspoon salt, and 1 cup warm water.     If you have any of the following side effects, call your doctors office:   Mouth sores that are painful and keep you from eating or drinking for more than 24 hours   White patches on your tongue or mouth that do not wipe off while brushing    Fatigue:     Fatigue (feeling tired or weak) is a common side effect of chemotherapy.    Figure out which tasks must be done and which tasks can wait for another time   Set realistic and reachable goals.   Pace yourself; switch between periods of activity with periods of rest   Take short naps during the day (no more than 1 hour). Longer naps can make you more tired, decrease energy levels and disrupt a natural sleep pattern   Keep an activities journal to help  track fatigue patterns   Put together a list of friends and/or family members who can help you with meals, running errands and household chores. Letting others help you will allow you to save your energy for things only you can do   Stay hydrated   Regular exercise, especially walking, is a good way to ease fatigue. Before beginning any new activities, talk to your doctor about the right exercise plan for  you   Massage therapy can help with feelings of fatigue. Our licensed massage therapist is available free of charge. If interested, ask at the checkout desk    If you have any of the following side effects, call your doctors office:     Moderate fatigue. Fatigue that does not get better with rest and stops you from doing certain tasks is considered moderate   Severe fatigue. Fatigue that does not get better with rest and that stops you from doing important self-care tasks such as bathing and dressing is considered severe.      Hair Loss (Alopecia):     Not all chemotherapy makes hair fall out and some can make your hair thinner. Ask your team about your chemotherapy and hair loss       Use mild shampoos, soft hair brushes and a low heat setting on hair dryers or let hair air-dry   Avoid using chemicals on your hair (perms or hair dye), these can speed up hair loss   Avoid using hot rollers, curling irons or, brush rollers, hair clips, or hair tieNot all chemotherapy  makes hair to fall out  and some  can make your hair thinner . Ask your team about your chemotherapy and hair loss.         Typically, hair loss will begin 2-3 weeks after treatment beginsCutting hair short can delay hair loss but not prevent it.   It is helpful to buy wigs or head coverings (scarves, wraps, or hats) before your hair starts to fall out. Ask your nurse for a list of stores that sell these and for a prescription    Body Fluid Precautions:    Chemotherapy can stay in some of your body fluids (urine, stool, saliva, semen and vaginal secretions) for approximately 48-72 hours following your chemotherapy treatment.    The following can help keep the people you live with safe after you get an infusion or take pills:   Close toilet bowl lid and flush twice after use   Make sure small children and pets do not play in or around the toilet you use   You do not need to use special drinking glasses or utensils, normal washing will  remove the chemotherapy waste products   Condoms should be used during sexual intercourse to protect your partner   Oral sex should be avoided; kissing and other forms of affection are safe    Fertility and Sexuality:    Fertility is the ability to have a child. Some chemotherapy drugs can affect a mans and/or womans fertility. Your cancer care team can talk to you about your risks and about options to preserve your fertility     The way you feel about having sex can change when having cancer treatment. Your cancer care team can give you more information and answer your questions    It is recommended that you do not become pregnant (or impregnate someone) while receiving chemotherapy and for some time once treatment has finished. Your cancer care team can tell you how long  stop during chemotherapy, you can be still produce eggs and get pregnant. Chemotherapy drugs could harm the baby-, so you should always use a reliable form of birth control    Support Services:     Social Work: [place]:  [phone]    Offering: yoga, massage, acupuncture, creative arts, cooking classes, Chiropodist, exercise consultations, Marcelino Duster, and meditation   Nutritionist: Appointments are available at the [facility], [facility], or [facility]. Please call to schedule:   [place]: [phone]   [place]: [phone]   [facility]: [phone]    Local Resources:   Normal: [place]: [place]: [phone]      Helpful Resources:     [place]    https://www.cancercare.org/    [place]  www.cancer.[place]  www.cancer.gov   Leukemia/Lymphoma society www.leukemia-lymphoma.org   [facility] [facility]        http://www.cancersupportcommunity.net/      Important:    It is important that your treatments to stay on time. If an emergency or unexpected issue happens and you cant make it to an appointment please call us as soon as possible.      Address:    [facility]  [address]  [place], [place] [address]    Hours:   Monday - Friday 8 a.m. to 5 p.m.   After hours an answering service is available by calling our main numbers.    Contact:   Phone : [phone] or [phone].   Fax: [phone]    You may also contact your healthcare team using MyChart.   Please be aware that only non-urgent medical issues should be communicated through MyChart and messages are only monitored during normal business hours. For all urgent issues please call the office.      For non-urgent questions, you can contact us by using MyChart. Please know that messages are only checked on during normal business hours.    For all urgent issues please call the office.

## 2020-07-15 NOTE — Patient Instructions (Addendum)
In the event that you become nauseous please take the Compazine (prochlorperazine).   You may take (1) tablet every 6 hours as needed.  Please do not take the Zofran (ondansetron) for the first 72 hours after chemotherapy. In the event you become nauseous after 72 hours and the Compazine (prochlorperazine) is not effective then you may try the Zofran (ondansetron). Most common side effect for the Compazine (prochlorperzine) is sleepiness.  The most common side effect of the Zofran (ondansetron) is headache & constipation.  In the event that you experience nausea not relieved with the Compazine (prochlorperazine) or Zofran (ondansetron) and you are unable to eat or drink, please call the office @ [phone].          July 2022      Sunday Monday Tuesday Wednesday Thursday Friday Saturday                            1     2       3     4     5     6     7     8    Outpatient Visit   9:18 AM   [facility]    FOLLOW UP VISIT   9:30 AM   (30 min.)   ,  D, MD   [facility]    CHEMOTHERAPY  10:30 AM   (210 min.)   [facility]   [facility] 9       10     11    NEW PATIENT VISIT  10:00 AM   (60 min.)   Mallaber, Patricia Renee, NP    [facility] 12     13     14  LABS   15  Taxol / Carbo  8:30  16       17     18     19     20     21  LABS   22  Taxol / Carbo   8:30  23       24     25     26     27     28  LABS   29  OV 8:30   Taxol / Carbo  9:30  30       07 September 2020      Sunday Monday Tuesday Wednesday Thursday Friday Saturday        1     2     3     4  LABS   5  Taxol / Carbo  8:30  6       7     8     9     10     11  LABS   12  Taxol / Carbo  8:30  13       14     15     16     17     18   LABS   19  OV 8:30  Taxol / Carbo 9:30    20       21     22     23     24     25     26     27       28     29     30     31                                 September 2022       Sunday Monday Tuesday Wednesday Thursday Friday Saturday                       1     2     3       4     5     6     7     8     9     10       11     12     13     14     15     16     17       18     19     20     21     22     23     24       25     26     27     28     29     30                       Patient Education   Paclitaxel (By injection)   Paclitaxel (pak-li-TAX-el)  Treats lung, ovary, and breast cancer and Kaposi sarcoma.   Brand Name(s):   There may be other brand names for this medicine.  When This Medicine Should Not Be Used:   This medicine is not right for everyone. You should not receive it if you had an allergic reaction to paclitaxel or polyoxyl 35 castor oil or if you are pregnant.  How to Use This Medicine:   Injectable   Medicines used to treat cancer are very strong and can have many side effects. Before receiving this medicine, make sure you understand all the risks and benefits. It is important for you to work closely with your doctor during your treatment.   You will receive this medicine while you are in a hospital or cancer treatment center. A nurse or other trained health professional will give you this medicine.   Your doctor will prescribe your dose and schedule. This medicine is given through a needle placed in a vein.   You may also receive other medicines to help prevent allergic reactions and nausea or vomiting from paclitaxel.   Read and follow the patient instructions that come with this medicine. Talk to your doctor or pharmacist if you have any questions.   Missed dose: This medicine needs to be given on a fixed schedule. If you miss a dose, call your doctor, home health caregiver, or treatment clinic for instructions.  Drugs and Foods to Avoid:   Ask your doctor or pharmacist before using any other medicine, including over-the-counter medicines, vitamins, and herbal products.   Some medicines and foods can affect the way this drug works. Tell  your doctor if you  are also using atazanavir, buspirone, carbamazepine, clarithromycin, eletriptan, felodipine, gemfibrozil, indinavir, itraconazole, ketoconazole, lovastatin, midazolam, nefazodone, nelfinavir, repaglinide, rifampin, ritonavir, rosiglitazone, saquinavir, sildenafil, simvastatin, telithromycin, or triazolam.  Warnings While Using This Medicine:    It is not safe to take this medicine during pregnancy. It could harm an unborn baby. Tell your doctor right away if you become pregnant.   Tell your doctor if you are breastfeeding or if you have liver disease, heart disease, or heart rhythm problems. Also tell your doctor if you had an allergic reaction to cyclosporin or teniposide.   This medicine may cause the following problems:  ? Severe allergic reaction  ? Heart rhythm problems  ? High blood pressure  ? Severe reaction where the needle is placed (may happen up to 10 days later)   This medicine may make you bleed, bruise, or get infections more easily. Take precautions to prevent illness and injury. Wash your hands often.   Cancer medicine can cause nausea or vomiting, sometimes even after you receive medicine to prevent these effects. Ask your doctor or nurse about other ways to control any nausea or vomiting that might happen.   Your doctor will do lab tests at regular visits to check on the effects of this medicine. Keep all appointments.  Possible Side Effects While Using This Medicine:   Call your doctor right away if you notice any of these side effects:   Allergic reaction: Itching or hives, swelling in your face or hands, swelling or tingling in your mouth or throat, chest tightness, trouble breathing   Fast, slow, or uneven heartbeat   Fever, chills, cough, sore throat, and body aches   Lightheadedness, dizziness, or fainting   Numbness, tingling, or burning pain in your hands, arms, legs, or feet   Severe redness, pain, swelling, or peeling where the needle is placed   Unusual bleeding, bruising,  or weakness  If you notice these less serious side effects, talk with your doctor:    Hair loss   Mild nausea, vomiting, diarrhea   Mild redness, tenderness, swelling, or color changes where the needle is placed   Muscle, bone, or joint pain   Sores or white patches on your lips, mouth, or throat  If you notice other side effects that you think are caused by this medicine, tell your doctor.   Call your doctor for medical advice about side effects. You may report side effects to FDA at 1-800-FDA-1088   Copyright [place] Information is for End User's use only and may not be sold, redistributed or otherwise used for commercial purposes.  The above information is an educational aid only. It is not intended as medical advice for individual conditions or treatments. Talk to your doctor, nurse or pharmacist before following any medical regimen to see if it is safe and effective for you.           Patient Education   Carboplatin (By injection)   Carboplatin (kar-boe-PLA-tin)  Treats cancer, including cancer of the ovaries, head, and neck. Sometimes used in combination with other medicines.   Brand Name(s): CARBOplatin N+ Novaplus, CARBOplatin Novaplus, Paraplatin, PremierPro Rx CARBOplatin   There may be other brand names for this medicine.  When This Medicine Should Not Be Used:   This medicine is not right for everyone. Do not receive it if you had an allergic reaction to carboplatin, mannitol, or platinum, or if you are pregnant or have bone marrow or bleeding problems.  How to Use This Medicine:   Injectable   Medicines used to treat cancer are very strong and can have many side effects. Before receiving this medicine, make sure you understand all the risks and benefits. It is important for you to work closely with your doctor during your treatment.   You will receive this medicine while you are in a hospital or cancer treatment center. A nurse or other trained health professional will give you this  medicine.   Your doctor will prescribe your dose and schedule. This medicine is given through a needle placed in a vein.   You may also receive medicines to help prevent nausea and vomiting.   Missed dose: This medicine needs to be given on a fixed schedule. If you miss a dose, call your doctor, home health caregiver, or treatment clinic for instructions.  Drugs and Foods to Avoid:   Ask your doctor or pharmacist before using any other medicine, including over-the-counter medicines, vitamins, and herbal products.   Some medicines can affect how carboplatin works. Tell your doctor if you are taking any other cancer medicines, or if you took cancer medicines in the past (especially cisplatin or other platinum medicines).   Talk to your doctor before you get a flu shot or other vaccine while you are receiving carboplatin.  Warnings While Using This Medicine:    It is not safe to take this medicine during pregnancy. It could harm an unborn baby. Tell your doctor right away if you become pregnant.   Tell your doctor if you are breastfeeding, or if you have kidney disease, hearing problems, or an infection of any kind. Tell your doctor if you have been around anyone who has chickenpox or any other infections. Stay away from anyone who has recently received an oral polio vaccine.   This medicine may make you bleed, bruise, or get infections more easily. Take precautions to prevent illness and injury. Wash your hands often.   Cancer medicine can cause nausea or vomiting, sometimes even after you receive medicine to prevent these effects. Ask your doctor or nurse about other ways to control any nausea or vomiting that might happen.  Possible Side Effects While Using This Medicine:   Call your doctor right away if you notice any of these side effects:   Allergic reaction: Itching or hives, swelling in your face or hands, swelling or tingling in your mouth or throat, chest tightness, trouble breathing   Bloody or  black, tarry stools, or blood in your urine   Changes in vision   Fever, chills, or cough   Numbness, tingling, or burning pain in your hands, arms, legs, or feet   Redness, pain, or swelling where the IV needle is placed   Ringing in your ears or trouble hearing   Unusual bleeding, bruising, or weakness  If you notice these less serious side effects, talk with your doctor:    Hair loss   Nausea, vomiting, loss of appetite  If you notice other side effects that you think are caused by this medicine, tell your doctor.   Call your doctor for medical advice about side effects. You may report side effects to FDA at 1-800-FDA-1088   Copyright [place] Information is for End User's use only and may not be sold, redistributed or otherwise used for commercial purposes.  The above information is an educational aid only. It is not intended as medical advice for individual conditions or treatments. Talk to your doctor, nurse or  pharmacist before following any medical regimen to see if it is safe and effective for you.             Patient Education   Doxorubicin (By injection)   Doxorubicin (dox-oh-ROO-bi-sin)  Treats cancer.   Brand Name(s): Adriamycin, Adriamycin Novaplus, PremierPro Rx DOXOrubicin HCl   There may be other brand names for this medicine.  When This Medicine Should Not Be Used:   This medicine is not right for everyone. You should not receive this medicine if you had an allergic reaction to doxorubicin, are pregnant, or have had a recent heart attack.  How to Use This Medicine:   Injectable   Medicines used to treat cancer are very strong and can have many side effects. Before receiving this medicine, make sure you understand all the risks and benefits. It is important for you to work closely with your doctor during your treatment.   Your doctor will prescribe your dose and schedule. This medicine is given through a needle placed in a vein.   You will receive this medicine while you are  in a hospital or cancer treatment center. A nurse or other trained health professional will give you this medicine.   The medicine is usually given once every 21 to 28 days.   Do not get the medicine on your skin. If it does, wash the area well with soap and water, and tell your caregiver.   If you feel stinging or burning in your skin where the needle is placed, tell your caregiver right away. Apply ice to the skin for 15 minutes, 4 times each day, to relieve pain or swelling, Do this for 3 days. Tell your doctor if you have blisters, sores, or other skin changes where the needle was placed.   Drink extra fluids so you will urinate more often and help prevent kidney problems.   Read and follow the patient instructions that come with this medicine. Talk to your doctor or pharmacist if you have any questions.   This medicine needs to be given on a fixed schedule. If you miss a dose, call your doctor, home health caregiver, or treatment clinic for instructions.  Drugs and Foods to Avoid:   Ask your doctor or pharmacist before using any other medicine, including over-the-counter medicines, vitamins, and herbal products.   Some foods and medicines can affect how doxorubicin works. Tell your doctor if you are also using any of the following:  ? St John's wort  ? Mercaptopurine  ? Cyclophosphamide, paclitaxel, , trastuzumab  ? Phenobarbital, phenytoin  ? Verapamil  Warnings While Using This Medicine:    This medicine may cause birth defects if either partner is using it during conception or pregnancy. Tell your doctor right away if you or your partner becomes pregnant. Use an effective form of birth control during treatment and for 6 months after the last dose of this medicine.   Tell your doctor if you are breastfeeding, or if you have liver disease, heart disease, or blood or bone marrow problems (myelosuppression). Tell your doctor if you have received other cancer medicine or radiation treatment.   This  medicine may cause the following problems:  ? Heart damage, including heart failure (may be permanent)  ? Heart rhythm problem  ? Higher risk of new cancers  ? Skin damage near the injection site  ? Tumor lysis syndrome   This medicine could cause infertility. Talk with your doctor before using this medicine if you plan to  have children. This medicine may also cause premature menopause.   This medicine may make you bleed, bruise, or get infections more easily. Take precautions to prevent illness and injury. Wash your hands often.   Your doctor will do lab tests at regular visits to check on the effects of this medicine. Keep all appointments.  Possible Side Effects While Using This Medicine:   Call your doctor right away if you notice any of these side effects:   Allergic reaction: Itching or hives, swelling in your face or hands, swelling or tingling in your mouth or throat, chest tightness, trouble breathing   Chest pain   Fast, pounding, or uneven heartbeat   Fever, chills, cough, sore throat, and body aches   Rapid weight gain, swelling in your hands, ankles, or feet, trouble breathing, tiredness   Severe nausea, mouth sores, diarrhea, or uncontrollable vomiting   Unusual bleeding, bruising, or weakness  If you notice these less serious side effects, talk with your doctor:    Changes in menstrual periods   Hair loss   Mild nausea, vomiting, diarrhea, mouth sores   Red urine for 1 to 2 days after treatment  If you notice other side effects that you think are caused by this medicine, tell your doctor.   Call your doctor for medical advice about side effects. You may report side effects to FDA at 1-800-FDA-1088   Copyright [place] Information is for End User's use only and may not be sold, redistributed or otherwise used for commercial purposes.  The above information is an educational aid only. It is not intended as medical advice for individual conditions or treatments. Talk to your  doctor, nurse or pharmacist before following any medical regimen to see if it is safe and effective for you.           Patient Education   Cyclophosphamide (By injection)   Cyclophosphamide (sye-kloe-FOS-fa-mide)  Treats cancer, including leukemia and lymphomas, and nephrotic syndrome.   Brand Name(s): Amerinet Choice cyclophosphamide, PremierPro Rx cyclophosphamide, cyclophosphamide Novaplus   There may be other brand names for this medicine.  When This Medicine Should Not Be Used:   This medicine is not right for everyone. You should not receive it if you have had an allergic reaction to cyclophosphamide, or if you are pregnant or breastfeeding.  How to Use This Medicine:   Injectable   You will receive this medicine while you are in a hospital or cancer treatment center. A nurse or other trained health professional will give you this medicine. It may also be given by a home health caregiver.   Your doctor will prescribe your dose and schedule. This medicine is given through a needle placed in a vein.   Tell your caregiver right away if any of the medicine gets on your skin.   Drink extra fluids so you will urinate more often and help prevent kidney problems.   Missed dose: This medicine needs to be given on a fixed schedule. If you miss a dose, call your doctor, home health caregiver, or treatment clinic for instructions.  Drugs and Foods to Avoid:   Ask your doctor or pharmacist before using any other medicine, including over-the-counter medicines, vitamins, and herbal products.   Some medicines can affect how cyclophosphamide works. Tell your doctor if you are also using cyclosporine, etanercept, pentostatin, a protease inhibitor, tamoxifen, a thiazide diuretic (water pill), trastuzumab, or warfarin.   Talk to your doctor before you get any vaccines, such as  a flu shot.  Warnings While Using This Medicine:    This medicine may cause birth defects if either partner is using it during conception or  pregnancy. Tell your doctor right away if you or your partner becomes pregnant. Women should avoid pregnancy for 1 year after treatment ends. Men should use birth control for 4 months after treatment ends so their partner does not become pregnant.   Make sure your doctor knows if you have kidney disease, liver disease, heart disease, or lung disease.   This medicine may cause the following problems:   ? Infertility  ? Liver disease  ? Kidney and urinary tract problems  ? Heart and lung problems  ? Slow or delayed healing  ? Secondary cancers   This medicine may make you bleed, bruise, or get infections more easily. Take precautions to prevent illness and injury. Wash your hands often.   Tell any doctor or dentist who treats you that you are using this medicine. You may need to stop using this medicine several days before you have surgery or medical tests.   Your doctor will do lab tests at regular visits to check on the effects of this medicine. Keep all appointments.  Possible Side Effects While Using This Medicine:   Call your doctor right away if you notice any of these side effects:   Allergic reaction: Itching or hives, swelling in your face or hands, swelling or tingling in your mouth or throat, chest tightness, trouble breathing   Blistering, peeling, red skin rash   Blood in your urine or stools, painful urination   Chest pain, trouble breathing   Dark urine or pale stools, yellow skin or eyes   Fast, pounding, or uneven heartbeat   Fever, chills, cough, sore throat   Rapid weight gain, swelling in your hands, ankles, or feet   Unusual bleeding, bruising, or weakness  If you notice these less serious side effects, talk with your doctor:    Changes in your menstrual periods   Hair loss, changes in skin or nail color   Nausea, vomiting, loss of appetite   Pain, swelling, itching, or irritation where the IV needle is placed   Sores or white patches in your mouth or throat  If you notice other  side effects that you think are caused by this medicine, tell your doctor.   Call your doctor for medical advice about side effects. You may report side effects to FDA at 1-800-FDA-1088   Copyright [place] Information is for End User's use only and may not be sold, redistributed or otherwise used for commercial purposes.  The above information is an educational aid only. It is not intended as medical advice for individual conditions or treatments. Talk to your doctor, nurse or pharmacist before following any medical regimen to see if it is safe and effective for you.

## 2020-07-15 NOTE — Progress Notes (Signed)
[place]     FOLLOW-UP VISIT    PATIENT NAME:  Carly Higgins, Carly Higgins    DOB:  [birth year]   MRN:  [id number]  07/15/2020     DIAGNOSIS:  Clinical stage IIB left breast cancer    REASON FOR VISIT: Courteney Alderete is seen today in anticipation of starting cycle 1 of weekly paclitaxel and carboplatin as neoadjuvant chemotherapy for clinical stage IIB (cT2, cN0, cM0) grade 3, ER negative, PR negative, HER2 negative infiltrating ductal carcinoma of the left breast.    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  In February 2022 she began to experience discomfort in the left breast.  She first attributed this to having been struck with a part of her dog when she picked it up about 1 week earlier.  She continued to experience discomfort intermittently.  She was in [place] at the time and did not seek any evaluation.      When she returned to [place] she scheduled a screening mammogram, having not had an exam since July 2018.  This was performed on May 18, 2020 and revealed an area of architectural distortion in the middle third of the left breast upper outer quadrant at the 2 o'clock position.    On Jun 07, 2020 diagnostic mammogram and breast ultrasound was performed.  The mammogram revealed scattered areas of fibroglandular densities with an area of architectural distortion again noted at the 2 o'clock position of the left breast.  Breast ultrasound revealed an irregular mass measuring 19 x 19 x 12 mm in the middle third upper outer quadrant of the left breast.  Internal echotexture was hypoechoic and there was internal vascularity.      Based on these findings, ultrasound-guided core biopsy of the left breast 2:00 lesion was performed on June 09, 2020.  Biopsy revealed invasive ductal carcinoma.  Provisional combined Nottingham grade was 3 of 3 (tubule formation score 3, nuclear grade 3, mitotic rate  score 2).  Tumor cells were negative for estrogen receptors and progesterone receptors.  Tumor cells were negative for HER2 by immunohistochemical staining with a staining score of 1+.  Ki-67 staining was seen in approximately 70% of tumor cells.    She was seen by Dr. Freda Munro for surgical consultation at which time she was noted to have a masslike area at the 2 o'clock position of the left breast which felt like a hematoma.  Staging scans were requested.    She was seen in the Hereditary Oncology clinic on June 13, 2020. Because of her prior allogeneic bone marrow transplant, she was not eligible for standard germline testing with blood or saliva specimen.      Whole-body bone scan performed on June 20, 2020 revealed no abnormal focus of radiotracer uptake to suggest osseous metastatic disease.    CT of the chest performed with contrast on June 22, 2020 revealed an ill-defined enhancing lesion in the lateral left breast measuring 1.7 x 1.2 x 1.1 cm.  There were asymmetrically prominent level 1 left axillary lymph nodes that demonstrated enhancement of uncertain significance.  These were felt possibly to represent metastatic disease.  There were no enlarged mediastinal, hilar, intramammary or supraclavicular lymph nodes.  No suspicious pulmonary nodules were identified.  CT of the abdomen pelvis performed at the same time revealed no evidence of metastatic disease within the abdomen and pelvis.  A small fat filled umbilical hernia  was noted.    INTERVAL HISTORY:  She reports that there has been no change in her health since her initial visit.  Appetite is good.  She denies recent fever or symptoms of infection.  She reports mild sweats.  She has moderate fatigue but is doing most of her normal activities.  She has no respiratory symptoms including cough and dyspnea.  She denies cardiac symptoms including exertional chest pain and palpitations.  She has no gastrointestinal symptoms including nausea,  vomiting, dysphagia, abdominal pain and bloating.  Bowel movements are regular without constipation or diarrhea.  She has no urinary symptoms.  She continues to experience left hip pain which has been present for the past 5 months.  She denies back pain or focal bone pain.  She denies swelling of her extremities.  She denies headaches.  She has no cognitive changes.  She reports no focal motor or sensory symptoms.  Her gait is affected at times because of the hip pain.  She has no dermatologic symptoms.    PAST MEDICAL/SURGICAL HISTORY:   1. 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  Prior to bone marrow transplant she received 5 cycles of decitabine.  She received fludarabine, busulfan and and hydroxyurea for the transplant and received methotrexate for graft-versus-host prophylaxis.  2. Hypothyroid.  3. Anxiety.  4. Osteoarthritis of the hips, left greater than right.  5. Mild diverticulosis of the sigmoid colon.  6. History of atrial fibrillation and congestive heart failure in 2017 related to the bone marrow transplant.  7. Right cataract surgery.  8. Right rotator cuff surgery.    MEDICATIONS:     Current Outpatient Medications   Medication Sig    metoprolol tartrate (LOPRESSOR) 50 mg tablet Take 1 tablet (50 mg total) by mouth 2 times daily    ondansetron (ZOFRAN) 4 mg tablet Take 1 tablet (4 mg total) by mouth 3 times daily as needed (nausea)    cholecalciferol (VITAMIN D) 50 MCG (2000 UT) tablet Take 1,000 Units by mouth daily    levothyroxine (SYNTHROID, LEVOTHROID) 50 MCG tablet Take 50 mcg by mouth daily (before breakfast)    escitalopram (LEXAPRO) 20 MG tablet Take 20 mg by mouth daily    aspirin 81 MG EC tablet Take 81 mg by mouth daily    Tirzepatide (MOUNJARO SC) Inject into the skin    acyclovir (ZOVIRAX) 400 mg tablet Take 400 mg by mouth 2 times daily       ROS: As  outlined above and in the nursing assessment. Comprehensive review of systems is otherwise negative.       Vitals:    07/15/20 0939   BP: 135/80   Pulse: 76   Temp: 36 C (96.8 F)   TempSrc: Temporal   SpO2: 96% on room air    Weight: 120.7 kg (266 lb 1.5 oz)       PHYSICAL EXAMINATION: General: Reveals a well-appearing woman. ECOG performance status is 0.  Weight is up 1.5 kg from last visit. HEENT exam: grossly unremarkable.  Sclerae are anicteric. Conjunctivae are pink.  Oral mucosa is unremarkable. Lymph nodes: There is no palpable cervical, supraclavicular, axillary or inguinal lymph node enlargement. Lungs: clear to auscultation and percussion.  Cardiac exam:  reveals a regular rate and rhythm. No murmur is noted. Breasts: Left breast has a palpable mass in the upper outer quadrant measuring approximately 4 cm.  Overlying skin  is unremarkable.  Abdomen: is soft.  Bowel sounds are normoactive.  There is no hepatosplenomegaly, mass or ascites detected by palpation and percussion..  Extremities: are without edema.  Neurologic exam: is grossly nonfocal. Skin: is without rash, petechiae or ecchymoses.  Again noted are patches of vitiligo in the upper abdomen.    LABORATORY DATA:     Comprehensive metabolic panel    Collection Time: 07/15/20  9:27 AM   Result Value Ref Range    Sodium 141 133 - 145 mmol/L    Potassium 5.0 3.3 - 5.1 mmol/L    Chloride 103 96 - 108 mmol/L    CO2 23 20 - 28 mmol/L    Anion Gap 15 7 - 16    UN 20 6 - 20 mg/dL    Creatinine 0.71 0.51 - 0.95 mg/dL    eGFR BY CREAT 97 *    Glucose 168 (H) 60 - 99 mg/dL    Calcium 9.7 8.6 - 10.2 mg/dL    Total Protein 6.9 6.3 - 7.7 g/dL    Albumin 4.4 3.5 - 5.2 g/dL    Bilirubin,Total 0.4 0.0 - 1.2 mg/dL    AST 26 0 - 35 U/L    ALT 31 0 - 35 U/L    Alk Phos 97 35 - 105 U/L      CBC and differential    Collection Time: 07/15/20  9:27 AM   Result Value Ref Range    WBC 6.2 4.0 - 10.0 THOU/uL    RBC 4.6 3.9 - 5.2 MIL/uL    Hemoglobin 14.0 11.2 - 15.7 g/dL     Hematocrit 43 34 - 45 %    MCV 95 79 - 95 fL    MCH 31 26 - 32 pg    MCHC 32 32 - 36 g/dL    RDW 15.1 (H) 11.7 - 14.4 %    Platelets 215 160 - 370 THOU/uL    Seg Neut % 54.4 %    Lymphocyte % 32.7 %    Monocyte % 7.9 %    Eosinophil % 4.5 %    Basophil % 0.5 %    Neut # K/uL 3.4 1.6 - 6.1 THOU/uL    Lymph # K/uL 2.0 1.2 - 3.7 THOU/uL    Mono # K/uL 0.5 0.2 - 0.9 THOU/uL    Eos # K/uL 0.3 0.0 - 0.4 THOU/uL    Baso # K/uL 0.0 0.0 - 0.1 THOU/uL       IMAGING DATA: MRI of the breast performed on June 28, 2020 revealed an irregular mass with spiculated margins measuring 10 x 25 x 19 mm in the left breast upper outer quadrant at the 2 o'clock position located 3 cm from the nipple.    Echocardiogram performed on July 05, 2020 revealed normal left ventricular size and systolic function with no significant regional wall motion abnormalities.  Calculated left ventricular ejection fraction was 65%.  Normal right ventricular size and function was noted.  There was aortic and mitral valve sclerosis without stenosis.  Ascending aorta was felt to be borderline enlarged.    ASSESSMENT: Ms. Aquilar has clinical stage IIB high-grade triple negative left breast cancer.  Once again I explained to her that neoadjuvant chemotherapy is typically considered in this setting.  I have checked with [name] regarding the status of her bone marrow and whether she felt there was any contraindication to Ms. Lovena Le receiving chemotherapy.  [name] felt that her bone marrow was  in good condition and that she should be able to tolerate standard chemotherapy.  A preferred regimen in this setting would be paclitaxel and carboplatin given weekly on days 1, 8 and 15 of a 21-day cycle for 4 cycles followed by doxorubicin and cyclophosphamide given on day 1 of a 21-day cycle for 4 cycles.  In addition, immunotherapy with pembrolizumab is a consideration, administered on day 1 of each cycle preoperatively and then continued after surgery.  I have  recommended that she not receive immunotherapy in light of her allogeneic bone marrow transplant.  Although there are reports of successful use of immunotherapy in patients who have undergone allogeneic bone marrow transplant, the numbers are small these patients are typically receiving immunotherapy for advanced disease in which case the risk for precipitating graft-versus-host was worth taking.  Since this is adjuvant therapy, I have recommended against immunotherapy.    Potential toxicities of chemotherapy were reviewed, including but not limited to nausea and vomiting, alopecia, myelosuppression with increased risk of infection, fatigue, potential for acute allergic reaction with paclitaxel, risk for sensory neuropathy with paclitaxel, and the risk for cardiac toxicity with doxorubicin.  It was also explained that the goal of treatment would be to reduce the size of the tumor prior to surgery and also to reduce the risk of developing distant recurrence as well as risk for dying from breast cancer.    Ms. Taketa has expressed understanding of the recommendations and is agreeable to starting treatment today.  She has given signed written consent.  Given the number of chemotherapy treatments she will receive, she will benefit from placement of a Mediport.  She has adequate peripheral IV access to start treatment today.  We will try to arrange for Mediport placement within the next few weeks.    PLAN:     Clinical stage IIB triple negative left breast cancer     Start cycle 1 of weekly paclitaxel and carboplatin today.  Treatment will consist of paclitaxel 80 mg/m and carboplatin AUC of 1.5 administered on days 1, 8 and 15 of each 21-day cycle.     Follow-up in 3 weeks anticipation of starting cycle 2 of planned 4 cycles of paclitaxel and carboplatin.     Arrange for Mediport placement within the next few weeks.     After completion of 4 cycles of paclitaxel and carboplatin, patient will receive 4 cycles of  doxorubicin 60 mg/m and cyclophosphamide 600 mg/m on day 1 of each 21-day cycle.     Hold on immunotherapy with pembrolizumab in light of her previous allogenic bone marrow transplant.     Surgical resection after completion of neoadjuvant chemotherapy.    COVID-19     Patient has received the [place] COVID-19 vaccine, receiving the first dose on May 15, 2019, second dose on Jun 05, 2019 and booster dose on October 08, 2019.     She is eligible for a second booster dose.  Recommend that she consider getting this in the next few months, preferably with the newer version of the vaccine when it becomes available.     Patient advised to continue to take precautions to reduce risk of contracting COVID-19, including social distancing, frequent handwashing and using a mask when in public.    Follow-up     Return for follow-up in 3 weeks.    Due to concerns regarding COVID-19, a facemask was worn by myself as well as the patient throughout the office visit, except lately to allow for inspection of  the oral cavity.    I personally spent more than 40 minutes on the calendar day of the encounter, including pre and post visit work as well as time spent directly with the patient reviewing prognosis, management options and answering questions.      [name], MD    xc:       Erin Sons, MD              Minus Liberty, MD              Assunta Curtis, MD

## 2020-07-15 NOTE — Progress Notes (Signed)
Patient arrived for 1st time Taxol and carboplatin. Patient reported having had received chemotherapy teaching during clinic visit. Briefly discussed with patient side effects of Taxol and carboplatin such as infusion reaction, nausea/vomitting, diarrhea and peripheral neuropathy. Discussed tips for caring for self after chemotherapy such as hydration, chemotherapy excretions, nausea/vomitting and nutrition. Reviewed with patient reasons to call pluta. Patient communicated understanding and has no questions at this time.   PIV placed in left forearm, blood return noted. Pre medications aloxi, compazine, decadron, benadryl and pepcid 30 minutes prior to Taxol. Taxol titrated for first ten minutes, starting at a rate of 10 mL per hour then increasing to 20 mL per hour and then increasing to a max rate to run over an hour. Writer stayed with patient for first ten minutes, patient tolerated infusion well. Carboplatin then administered over 30 minutes. Patient tolerated well. PIV discontinued, blood return noted and site benign. Patient discharged in stable condition.       Patient complied with masking policy throughout the duration of treatment. Written maintained use of mask throughout duration of treatment as well and written was within 6 feet of patient for greater than 15 minutes due to the nature of the treatment.

## 2020-07-18 ENCOUNTER — Ambulatory Visit: Payer: No Typology Code available for payment source | Admitting: Oncology

## 2020-07-18 ENCOUNTER — Telehealth: Payer: Self-pay | Admitting: Oncology

## 2020-07-21 ENCOUNTER — Ambulatory Visit: Payer: No Typology Code available for payment source | Admitting: Hematology and Oncology

## 2020-07-21 ENCOUNTER — Other Ambulatory Visit: Payer: Self-pay | Admitting: Hematology and Oncology

## 2020-07-21 ENCOUNTER — Other Ambulatory Visit
Admission: RE | Admit: 2020-07-21 | Discharge: 2020-07-21 | Disposition: A | Discharge status: Home / Self Care | Payer: Medicare Other | Source: Ambulatory Visit | Attending: Hematology and Oncology | Admitting: Hematology and Oncology

## 2020-07-21 DIAGNOSIS — D471 Chronic myeloproliferative disease: Secondary | ICD-10-CM | POA: Insufficient documentation

## 2020-07-21 DIAGNOSIS — Z171 Estrogen receptor negative status [ER-]: Secondary | ICD-10-CM | POA: Insufficient documentation

## 2020-07-21 DIAGNOSIS — C50412 Malignant neoplasm of upper-outer quadrant of left female breast: Secondary | ICD-10-CM | POA: Insufficient documentation

## 2020-07-21 LAB — CBC AND DIFFERENTIAL
Baso # K/uL: 0.1 10*3/uL (ref 0.0–0.1)
Basophil %: 1 %
Eos # K/uL: 0.3 10*3/uL (ref 0.0–0.4)
Eosinophil %: 4.3 %
Hematocrit: 43 % (ref 34–45)
Hemoglobin: 13.9 g/dL (ref 11.2–15.7)
IMM Granulocytes #: 0 10*3/uL (ref 0.0–0.0)
IMM Granulocytes: 0.5 %
Lymph # K/uL: 2.5 10*3/uL (ref 1.2–3.7)
Lymphocyte %: 41.3 %
MCH: 31 pg (ref 26–32)
MCHC: 32 g/dL (ref 32–36)
MCV: 95 fL (ref 79–95)
Mono # K/uL: 0.4 10*3/uL (ref 0.2–0.9)
Monocyte %: 7.2 %
Neut # K/uL: 2.8 10*3/uL (ref 1.6–6.1)
Nucl RBC # K/uL: 0 10*3/uL (ref 0.0–0.0)
Nucl RBC %: 0 /100 WBC (ref 0.0–0.2)
Platelets: 235 10*3/uL (ref 160–370)
RBC: 4.5 MIL/uL (ref 3.9–5.2)
RDW: 14.4 % (ref 11.7–14.4)
Seg Neut %: 45.7 %
WBC: 6.1 10*3/uL (ref 4.0–10.0)

## 2020-07-21 LAB — COMPREHENSIVE METABOLIC PANEL
ALT: 48 U/L — ABNORMAL HIGH (ref 0–35)
AST: 29 U/L (ref 0–35)
Albumin: 4.6 g/dL (ref 3.5–5.2)
Alk Phos: 96 U/L (ref 35–105)
Anion Gap: 12 (ref 7–16)
Bilirubin,Total: 0.5 mg/dL (ref 0.0–1.2)
CO2: 24 mmol/L (ref 20–28)
Calcium: 9.8 mg/dL (ref 8.6–10.2)
Chloride: 100 mmol/L (ref 96–108)
Creatinine: 0.76 mg/dL (ref 0.51–0.95)
Glucose: 117 mg/dL — ABNORMAL HIGH (ref 60–99)
Lab: 22 mg/dL — ABNORMAL HIGH (ref 6–20)
Potassium: 5.7 mmol/L — ABNORMAL HIGH (ref 3.3–5.1)
Sodium: 136 mmol/L (ref 133–145)
Total Protein: 7 g/dL (ref 6.3–7.7)
eGFR BY CREAT: 90 *

## 2020-07-21 LAB — MULTIPLE ORDERING DOCS

## 2020-07-21 LAB — NEUTROPHIL #-INSTRUMENT: Neutrophil #-Instrument: 2.8 10*3/uL

## 2020-07-21 LAB — LACTATE DEHYDROGENASE: LD: 162 U/L (ref 118–225)

## 2020-07-22 ENCOUNTER — Ambulatory Visit: Payer: Medicare Other | Attending: Hematology and Oncology

## 2020-07-22 VITALS — BP 121/77 | HR 69 | Temp 95.2°F | Wt 265.2 lb

## 2020-07-22 DIAGNOSIS — Z171 Estrogen receptor negative status [ER-]: Secondary | ICD-10-CM | POA: Insufficient documentation

## 2020-07-22 DIAGNOSIS — D471 Chronic myeloproliferative disease: Secondary | ICD-10-CM | POA: Insufficient documentation

## 2020-07-22 DIAGNOSIS — Z5111 Encounter for antineoplastic chemotherapy: Secondary | ICD-10-CM | POA: Insufficient documentation

## 2020-07-22 DIAGNOSIS — C50412 Malignant neoplasm of upper-outer quadrant of left female breast: Secondary | ICD-10-CM | POA: Insufficient documentation

## 2020-07-22 MED ORDER — PALONOSETRON HCL 0.25 MG/5ML IV SOLN *I*
0.2500 mg | Freq: Once | INTRAVENOUS | Status: AC
Start: 2020-07-22 — End: 2020-07-22

## 2020-07-22 MED ORDER — DEXAMETHASONE SODIUM PHOSPHATE 10 MG/ML IJ SOLN *I*
10.0000 mg | Freq: Once | INTRAMUSCULAR | Status: AC
Start: 2020-07-22 — End: 2020-07-22

## 2020-07-22 MED ORDER — SODIUM CHLORIDE 0.9 % IV SOLN WRAPPED *I*
80.0000 mg/m2 | Freq: Once | INTRAVENOUS | Status: AC
Start: 2020-07-22 — End: 2020-07-22
  Administered 2020-07-22: 187 mg via INTRAVENOUS
  Filled 2020-07-22: qty 31.17

## 2020-07-22 MED ORDER — DIPHENHYDRAMINE HCL 50 MG/ML IJ SOLN *I*
12.5000 mg | Freq: Once | INTRAMUSCULAR | Status: AC
Start: 2020-07-22 — End: 2020-07-22
  Administered 2020-07-22: 12.5 mg via INTRAVENOUS

## 2020-07-22 MED ORDER — DIPHENHYDRAMINE HCL 50 MG/ML IJ SOLN *I*
25.0000 mg | Freq: Once | INTRAMUSCULAR | Status: DC
Start: 2020-07-22 — End: 2020-07-22

## 2020-07-22 MED ORDER — SODIUM CHLORIDE 0.9 % IV SOLN WRAPPED *I*
30.0000 mL/h | Status: DC | PRN
Start: 2020-07-22 — End: 2020-07-22
  Administered 2020-07-22: 30 mL/h
  Administered 2020-07-22 (×2): 301 mL/h
  Administered 2020-07-22: 100 mL/h
  Administered 2020-07-22: 20 mL/h
  Administered 2020-07-22: 301 mL/h
  Administered 2020-07-22: 100 mL/h
  Administered 2020-07-22: 150 mL/h
  Administered 2020-07-22: 20 mL/h
  Administered 2020-07-22: 30 mL/h via INTRAVENOUS

## 2020-07-22 MED ORDER — FAMOTIDINE (PF) 20 MG/2ML IV SOLN *I*
20.0000 mg | Freq: Once | INTRAVENOUS | Status: AC
Start: 2020-07-22 — End: 2020-07-22

## 2020-07-22 MED ORDER — DEXTROSE 5 % IV SOLN WRAPPED *I*
225.0000 mg | Freq: Once | INTRAVENOUS | Status: AC
Start: 2020-07-22 — End: 2020-07-22
  Administered 2020-07-22: 225 mg via INTRAVENOUS
  Filled 2020-07-22: qty 22.5

## 2020-07-22 MED ORDER — DEXAMETHASONE SOD PHOSPHATE PF 10 MG/ML IJ SOLN *I*
INTRAMUSCULAR | Status: AC
Start: 2020-07-22 — End: 2020-07-22
  Administered 2020-07-22: 10 mg via INTRAVENOUS
  Filled 2020-07-22: qty 1

## 2020-07-22 MED ORDER — PALONOSETRON HCL 0.25 MG/5ML IV SOLN *I*
INTRAVENOUS | Status: AC
Start: 2020-07-22 — End: 2020-07-22
  Administered 2020-07-22: 0.25 mg via INTRAVENOUS
  Filled 2020-07-22: qty 5

## 2020-07-22 MED ORDER — PROCHLORPERAZINE MALEATE 10 MG PO TABS *I*
10.0000 mg | ORAL_TABLET | Freq: Once | ORAL | Status: AC
Start: 2020-07-22 — End: 2020-07-22
  Administered 2020-07-22: 10 mg via ORAL

## 2020-07-22 MED ORDER — FAMOTIDINE (PF) 20 MG/2ML IV SOLN *I*
INTRAVENOUS | Status: AC
Start: 2020-07-22 — End: 2020-07-22
  Administered 2020-07-22: 20 mg via INTRAVENOUS
  Filled 2020-07-22: qty 2

## 2020-07-22 MED ORDER — HEPARIN LOCK FLUSH 10 UNIT/ML IJ SOLN WRAPPED *I*
50.0000 [IU] | INTRAVENOUS | Status: DC | PRN
Start: 2020-07-22 — End: 2020-07-22

## 2020-07-22 MED ORDER — DIPHENHYDRAMINE HCL 50 MG/ML IJ SOLN *I*
INTRAMUSCULAR | Status: DC
Start: 2020-07-22 — End: 2020-07-22
  Filled 2020-07-22: qty 1

## 2020-07-22 NOTE — Progress Notes (Signed)
Patient arrived for 2nd time Taxol. Labs and consent noted. PIV placed in left forearm, blood return noted. Pre medications aloxi, pepcid, decadron, compazine and benadryl given 30 minutes prior to Taxol (benadryl dose reduced to 12.5 mg). Taxol administered over 1 hour. Writer stayed with patient for first ten minutes, no signs or symptoms of any reaction. Carboplatin then infused over 30 minutes. Patient tolerated well. PIV discontinued, blood return noted and site benign. Patient discharged in stable condition.       Patient complied with masking policy throughout the duration of treatment. Written maintained use of mask throughout duration of treatment as well and written was within 6 feet of patient for greater than 15 minutes due to the nature of the treatment.

## 2020-07-25 ENCOUNTER — Other Ambulatory Visit: Payer: No Typology Code available for payment source

## 2020-07-27 ENCOUNTER — Ambulatory Visit: Payer: Medicare Other | Admitting: Oncology

## 2020-07-27 ENCOUNTER — Telehealth: Payer: Self-pay | Admitting: Oncology

## 2020-07-27 NOTE — Telephone Encounter (Signed)
07/27/2020  Attempted to reach patient for telephone visit today, she did not answer.  Left message encouraging patient to call me back to reschedule.  Melynda Keller, NP

## 2020-07-28 ENCOUNTER — Other Ambulatory Visit: Payer: Self-pay | Admitting: Hematology and Oncology

## 2020-07-28 ENCOUNTER — Other Ambulatory Visit
Admission: RE | Admit: 2020-07-28 | Discharge: 2020-07-28 | Disposition: A | Discharge status: Home / Self Care | Payer: Medicare Other | Source: Ambulatory Visit | Attending: Hematology and Oncology | Admitting: Hematology and Oncology

## 2020-07-28 DIAGNOSIS — D471 Chronic myeloproliferative disease: Secondary | ICD-10-CM | POA: Insufficient documentation

## 2020-07-28 DIAGNOSIS — C50412 Malignant neoplasm of upper-outer quadrant of left female breast: Secondary | ICD-10-CM | POA: Insufficient documentation

## 2020-07-28 DIAGNOSIS — Z171 Estrogen receptor negative status [ER-]: Secondary | ICD-10-CM | POA: Insufficient documentation

## 2020-07-28 LAB — CBC AND DIFFERENTIAL
Baso # K/uL: 0.1 10*3/uL (ref 0.0–0.1)
Basophil %: 0.7 %
Eos # K/uL: 0.3 10*3/uL (ref 0.0–0.4)
Eosinophil %: 3.7 %
Hematocrit: 42 % (ref 34–45)
Hemoglobin: 13.9 g/dL (ref 11.2–15.7)
IMM Granulocytes #: 0 10*3/uL (ref 0.0–0.0)
IMM Granulocytes: 0.4 %
Lymph # K/uL: 3.4 10*3/uL (ref 1.2–3.7)
Lymphocyte %: 49.2 %
MCH: 31 pg (ref 26–32)
MCHC: 33 g/dL (ref 32–36)
MCV: 92 fL (ref 79–95)
Mono # K/uL: 0.4 10*3/uL (ref 0.2–0.9)
Monocyte %: 6 %
Neut # K/uL: 2.7 10*3/uL (ref 1.6–6.1)
Nucl RBC # K/uL: 0 10*3/uL (ref 0.0–0.0)
Nucl RBC %: 0 /100 WBC (ref 0.0–0.2)
Platelets: 278 10*3/uL (ref 160–370)
RBC: 4.5 MIL/uL (ref 3.9–5.2)
RDW: 14.6 % — ABNORMAL HIGH (ref 11.7–14.4)
Seg Neut %: 40 %
WBC: 6.8 10*3/uL (ref 4.0–10.0)

## 2020-07-28 LAB — COMPREHENSIVE METABOLIC PANEL
ALT: 39 U/L — ABNORMAL HIGH (ref 0–35)
AST: 27 U/L (ref 0–35)
Albumin: 4.5 g/dL (ref 3.5–5.2)
Alk Phos: 93 U/L (ref 35–105)
Anion Gap: 17 — ABNORMAL HIGH (ref 7–16)
Bilirubin,Total: 0.4 mg/dL (ref 0.0–1.2)
CO2: 24 mmol/L (ref 20–28)
Calcium: 10 mg/dL (ref 8.6–10.2)
Chloride: 100 mmol/L (ref 96–108)
Creatinine: 0.98 mg/dL — ABNORMAL HIGH (ref 0.51–0.95)
Glucose: 113 mg/dL — ABNORMAL HIGH (ref 60–99)
Lab: 17 mg/dL (ref 6–20)
Potassium: 4.9 mmol/L (ref 3.3–5.1)
Sodium: 141 mmol/L (ref 133–145)
Total Protein: 6.8 g/dL (ref 6.3–7.7)
eGFR BY CREAT: 66 *

## 2020-07-28 LAB — MULTIPLE ORDERING DOCS

## 2020-07-28 LAB — LACTATE DEHYDROGENASE: LD: 211 U/L (ref 118–225)

## 2020-07-28 LAB — NEUTROPHIL #-INSTRUMENT: Neutrophil #-Instrument: 2.7 10*3/uL

## 2020-07-29 ENCOUNTER — Ambulatory Visit: Payer: Medicare Other | Admitting: Acupuncturist

## 2020-07-29 ENCOUNTER — Ambulatory Visit: Payer: Medicare Other | Attending: Hematology and Oncology

## 2020-07-29 VITALS — BP 116/85 | HR 74 | Temp 96.8°F | Resp 18 | Wt 266.1 lb

## 2020-07-29 DIAGNOSIS — Z171 Estrogen receptor negative status [ER-]: Secondary | ICD-10-CM | POA: Insufficient documentation

## 2020-07-29 DIAGNOSIS — C50412 Malignant neoplasm of upper-outer quadrant of left female breast: Secondary | ICD-10-CM | POA: Insufficient documentation

## 2020-07-29 DIAGNOSIS — Z5111 Encounter for antineoplastic chemotherapy: Secondary | ICD-10-CM | POA: Insufficient documentation

## 2020-07-29 DIAGNOSIS — D471 Chronic myeloproliferative disease: Secondary | ICD-10-CM | POA: Insufficient documentation

## 2020-07-29 MED ORDER — FAMOTIDINE (PF) 20 MG/2ML IV SOLN *I*
20.0000 mg | Freq: Once | INTRAVENOUS | Status: AC
Start: 2020-07-29 — End: 2020-07-29

## 2020-07-29 MED ORDER — FAMOTIDINE (PF) 20 MG/2ML IV SOLN *I*
INTRAVENOUS | Status: AC
Start: 2020-07-29 — End: 2020-07-29
  Administered 2020-07-29: 20 mg via INTRAVENOUS
  Filled 2020-07-29: qty 2

## 2020-07-29 MED ORDER — SEQUENCING OF DRUGS FOR ADMINISTRATION *I*
1.0000 | Status: DC | PRN
Start: 2020-07-29 — End: 2020-07-29
  Filled 2020-07-29: qty 1

## 2020-07-29 MED ORDER — PALONOSETRON HCL 0.25 MG/5ML IV SOLN *I*
0.2500 mg | Freq: Once | INTRAVENOUS | Status: AC
Start: 2020-07-29 — End: 2020-07-29

## 2020-07-29 MED ORDER — SODIUM CHLORIDE 0.9 % INJ (FLUSH) WRAPPED *I*
10.0000 mL | Status: DC | PRN
Start: 2020-07-29 — End: 2020-07-29
  Administered 2020-07-29: 10 mL

## 2020-07-29 MED ORDER — SODIUM CHLORIDE 0.9 % IV SOLN WRAPPED *I*
80.0000 mg/m2 | Freq: Once | INTRAVENOUS | Status: AC
Start: 2020-07-29 — End: 2020-07-29
  Administered 2020-07-29: 187 mg via INTRAVENOUS
  Filled 2020-07-29: qty 31.17

## 2020-07-29 MED ORDER — DEXAMETHASONE SOD PHOSPHATE PF 10 MG/ML IJ SOLN *I*
INTRAMUSCULAR | Status: AC
Start: 2020-07-29 — End: 2020-07-29
  Administered 2020-07-29: 10 mg via INTRAVENOUS
  Filled 2020-07-29: qty 1

## 2020-07-29 MED ORDER — DEXTROSE 5 % IV SOLN WRAPPED *I*
206.7000 mg | Freq: Once | INTRAVENOUS | Status: AC
Start: 2020-07-29 — End: 2020-07-29
  Administered 2020-07-29: 206.7 mg via INTRAVENOUS
  Filled 2020-07-29: qty 20.67

## 2020-07-29 MED ORDER — DIPHENHYDRAMINE HCL 50 MG/ML IJ SOLN *I*
12.5000 mg | Freq: Once | INTRAMUSCULAR | Status: AC
Start: 2020-07-29 — End: 2020-07-29

## 2020-07-29 MED ORDER — DEXAMETHASONE SODIUM PHOSPHATE 10 MG/ML IJ SOLN *I*
10.0000 mg | Freq: Once | INTRAMUSCULAR | Status: AC
Start: 2020-07-29 — End: 2020-07-29

## 2020-07-29 MED ORDER — SODIUM CHLORIDE 0.9 % IV SOLN WRAPPED *I*
30.0000 mL/h | Status: DC | PRN
Start: 2020-07-29 — End: 2020-07-29
  Administered 2020-07-29: 300 mL/h
  Administered 2020-07-29: 20 mL/h
  Administered 2020-07-29: 150 mL/h
  Administered 2020-07-29: 30 mL/h via INTRAVENOUS
  Administered 2020-07-29: 30 mL/h
  Administered 2020-07-29 (×2): 300 mL/h
  Administered 2020-07-29: 30 mL/h
  Administered 2020-07-29: 150 mL/h
  Administered 2020-07-29: 20 mL/h

## 2020-07-29 MED ORDER — PALONOSETRON HCL 0.25 MG/5ML IV SOLN *I*
INTRAVENOUS | Status: AC
Start: 2020-07-29 — End: 2020-07-29
  Administered 2020-07-29: 0.25 mg via INTRAVENOUS
  Filled 2020-07-29: qty 5

## 2020-07-29 MED ORDER — PROCHLORPERAZINE MALEATE 10 MG PO TABS *I*
10.0000 mg | ORAL_TABLET | Freq: Once | ORAL | Status: AC
Start: 2020-07-29 — End: 2020-07-29
  Administered 2020-07-29: 10 mg via ORAL

## 2020-07-29 MED ORDER — DIPHENHYDRAMINE HCL 50 MG/ML IJ SOLN *I*
INTRAMUSCULAR | Status: AC
Start: 2020-07-29 — End: 2020-07-29
  Administered 2020-07-29: 12.5 mg via INTRAVENOUS
  Filled 2020-07-29: qty 1

## 2020-07-29 NOTE — Progress Notes (Signed)
Acupuncture Treatment in Infusion room for Cancer Patient    Date: 07/29/2020  Patient Name: Carly Higgins  DOB: 10-18-1960    Diagnosis: IDC left , MPN (myeloproliferative neoplasm)    SUBJECTIVE:  Left hip pain for a couple of years, worse now. Today is ok.   OBJECTIVE:   Shen-spirit: good  Tongue body: Normal  Tongue coating: Dry and White  Sublingle Veins: Normal  Pulse Rate:  72  / minute  Pulse Quality: Moderate and Slippery    TCM ASSESSMENT: Local stagnation  TCM Treatment Principle: even    Acupuncture Points: DU20 (1), K7 (2), SP9 (1L), GB31 (1L), GB32 (1L), ST34 (1L), ASHI (1L)  Total Needles: 8  Needle Retain: 10 minutes  Other modalities: Tui Na  During treatment notes: Good    Recommendation: Full session acupuncture in [facility]       Patient provided verbal understanding and consent for acupuncture/acupressure in infusion room for the brief session treatment. They have no further questions at this time.    Patient and/ patient visitor complied with masking policy throughout duration of appointment. Writer maintained use of mask and face shield or goggles throughout duration of appointment. Writer was within 6 feet of patient for >5 minutes due to the nature of the treatment.

## 2020-07-29 NOTE — Progress Notes (Signed)
Patient presented to infusion for Taxol/Carbo, labs, consent and handoff noted, PIV placed in left wrist without issue, positive blood return noted, premeds Aloxi, Compazine, Decadron, Benadryl and Pepcid given, patient denies any questions or concerns, Taxol infused over 1 hour, followed by Carboplatin over 30 minutes, patient tolerated treatment well, post blood return noted, line flushed with normal saline, PIV discontinued, vital signs stable, patient discharged to home with self in good condition.    Patient complied with masking policy throughout duration of appointment. Writer maintained use of mask throughout duration of appointment. Writer was within 6 feet of patient for > 15 minutes due to the nature of the treatment.

## 2020-07-29 NOTE — Patient Instructions (Addendum)
July 2022      'Sunday Monday Tuesday Wednesday Thursday Friday Saturday                            1     2       3     4     5     6     7     8    Outpatient Visit   9:18 AM   [facility]    FOLLOW UP VISIT   9:30 AM   (30 min.)   Yirinec, Brian D, MD   [facility]    CHEMOTHERAPY  10:30 AM   (210 min.)   Ryan, Caitlin, RN   [facility] 9       10     11     12     13     14    Outpatient Visit   8:30 AM   [facility] 15    CHEMOTHERAPY   8:30 AM   (150 min.)   Ryan, Caitlin, RN   [facility] 16       17     18     19     20    NEW PATIENT VISIT   2:30 PM   (60 min.)   Mallaber, Patricia Renee, NP   [facility] 21    Outpatient Visit  10:45 AM   [facility] 22    CHEMOTHERAPY   8:30 AM   (150 min.)   Langworthy, Melissa M, RN   [facility]    ACUPUNCTURE   9:00 AM   (15 min.)   Li, Aizhong   [facility] 23       24     25     26     27     28     29    FOLLOW UP VISIT   8:30 AM   (30 min.)   Yirinec, Brian D, MD   [facility]    CHEMOTHERAPY   9:30 AM   (150 min.)   CAN CTR, PLUTA POD C   [facility] 30       07 September 2020      Sunday Monday Tuesday Wednesday Thursday Friday Saturday        1     2     3     4     5    CHEMOTHERAPY   8:30 AM   (150 min.)   CAN CTR, PLUTA POD B   [facility] 6       7     8     9     10     11     12    CHEMOTHERAPY   8:30 AM   (150 min.)   CAN CTR, PLUTA POD A   [facility] 13       14'$   $'15     16     17     18     19    'R$ FOLLOW UP VISIT   8:30 AM   (30 min.)   Beverely Pace, MD   [facility]    CHEMOTHERAPY   9:30 AM   (150 min.)   CAN CTR, PLUTA POD B   [facility] '20       21     22     23     24      25  '$ Labs   26  Taxol/ Carbo   '27       28     29     30     31                                '$ September 2022      'Sunday Monday Tuesday Wednesday Thursday Friday Saturday                       1  Labs   2  Taxol/ Carbo   3       4     5     6     7  Labs   8  OV 1:30 PM    Taxol/ Carbo 2:30 PM 9       10       11     12     13     14     15     16     17       18     19     20     21     22     23     24       25     26     27     28     29     06 November 2020      Sunday Monday Tuesday Wednesday Thursday Friday Saturday                                 1       2     3     4     5     6     7     8       9     10     11     12     13     14     15       16     17     18     19     20     21     22       23     24     25     26     27     28     29       30     31'$

## 2020-08-04 ENCOUNTER — Other Ambulatory Visit: Payer: Self-pay | Admitting: Hematology and Oncology

## 2020-08-04 ENCOUNTER — Other Ambulatory Visit
Admission: RE | Admit: 2020-08-04 | Discharge: 2020-08-04 | Disposition: A | Discharge status: Home / Self Care | Payer: Medicare Other | Source: Ambulatory Visit | Attending: Hematology and Oncology | Admitting: Hematology and Oncology

## 2020-08-04 DIAGNOSIS — D471 Chronic myeloproliferative disease: Secondary | ICD-10-CM | POA: Insufficient documentation

## 2020-08-04 DIAGNOSIS — C50412 Malignant neoplasm of upper-outer quadrant of left female breast: Secondary | ICD-10-CM | POA: Insufficient documentation

## 2020-08-04 DIAGNOSIS — Z171 Estrogen receptor negative status [ER-]: Secondary | ICD-10-CM | POA: Insufficient documentation

## 2020-08-04 LAB — COMPREHENSIVE METABOLIC PANEL
ALT: 51 U/L — ABNORMAL HIGH (ref 0–35)
AST: 34 U/L (ref 0–35)
Albumin: 4.1 g/dL (ref 3.5–5.2)
Alk Phos: 88 U/L (ref 35–105)
Anion Gap: 11 (ref 7–16)
Bilirubin,Total: 0.4 mg/dL (ref 0.0–1.2)
CO2: 26 mmol/L (ref 20–28)
Calcium: 9.4 mg/dL (ref 8.6–10.2)
Chloride: 101 mmol/L (ref 96–108)
Creatinine: 0.69 mg/dL (ref 0.51–0.95)
Glucose: 102 mg/dL — ABNORMAL HIGH (ref 60–99)
Lab: 19 mg/dL (ref 6–20)
Potassium: 4.9 mmol/L (ref 3.3–5.1)
Sodium: 138 mmol/L (ref 133–145)
Total Protein: 6.4 g/dL (ref 6.3–7.7)
eGFR BY CREAT: 99 *

## 2020-08-04 LAB — CBC AND DIFFERENTIAL
Baso # K/uL: 0.1 10*3/uL (ref 0.0–0.1)
Basophil %: 1.1 %
Eos # K/uL: 0.2 10*3/uL (ref 0.0–0.4)
Eosinophil %: 4.1 %
Hematocrit: 37 % (ref 34–45)
Hemoglobin: 12.1 g/dL (ref 11.2–15.7)
IMM Granulocytes #: 0 10*3/uL (ref 0.0–0.0)
IMM Granulocytes: 0.6 %
Lymph # K/uL: 2.4 10*3/uL (ref 1.2–3.7)
Lymphocyte %: 44 %
MCH: 31 pg (ref 26–32)
MCHC: 33 g/dL (ref 32–36)
MCV: 95 fL (ref 79–95)
Mono # K/uL: 0.5 10*3/uL (ref 0.2–0.9)
Monocyte %: 8.9 %
Neut # K/uL: 2.2 10*3/uL (ref 1.6–6.1)
Nucl RBC # K/uL: 0 10*3/uL (ref 0.0–0.0)
Nucl RBC %: 0 /100 WBC (ref 0.0–0.2)
Platelets: 250 10*3/uL (ref 160–370)
RBC: 3.9 MIL/uL (ref 3.9–5.2)
RDW: 15.3 % — ABNORMAL HIGH (ref 11.7–14.4)
Seg Neut %: 41.3 %
WBC: 5.4 10*3/uL (ref 4.0–10.0)

## 2020-08-04 LAB — LACTATE DEHYDROGENASE: LD: 188 U/L (ref 118–225)

## 2020-08-04 LAB — NEUTROPHIL #-INSTRUMENT: Neutrophil #-Instrument: 2.2 10*3/uL

## 2020-08-05 ENCOUNTER — Ambulatory Visit: Payer: Medicare Other | Admitting: Acupuncturist

## 2020-08-05 ENCOUNTER — Encounter: Payer: Self-pay | Admitting: Hematology and Oncology

## 2020-08-05 ENCOUNTER — Ambulatory Visit: Payer: Medicare Other | Attending: Hematology and Oncology | Admitting: Hematology and Oncology

## 2020-08-05 ENCOUNTER — Ambulatory Visit: Payer: Medicare Other

## 2020-08-05 VITALS — BP 139/85 | HR 77 | Temp 97.9°F | Wt 271.8 lb

## 2020-08-05 DIAGNOSIS — Z5111 Encounter for antineoplastic chemotherapy: Secondary | ICD-10-CM | POA: Insufficient documentation

## 2020-08-05 DIAGNOSIS — C50412 Malignant neoplasm of upper-outer quadrant of left female breast: Secondary | ICD-10-CM | POA: Insufficient documentation

## 2020-08-05 DIAGNOSIS — Z171 Estrogen receptor negative status [ER-]: Secondary | ICD-10-CM

## 2020-08-05 DIAGNOSIS — D471 Chronic myeloproliferative disease: Secondary | ICD-10-CM | POA: Insufficient documentation

## 2020-08-05 MED ORDER — SEQUENCING OF DRUGS FOR ADMINISTRATION *I*
1.0000 | Status: DC | PRN
Start: 2020-08-05 — End: 2020-08-05
  Filled 2020-08-05: qty 1

## 2020-08-05 MED ORDER — PROCHLORPERAZINE MALEATE 10 MG PO TABS *I*
10.0000 mg | ORAL_TABLET | Freq: Once | ORAL | Status: AC
Start: 2020-08-05 — End: 2020-08-05
  Administered 2020-08-05: 10 mg via ORAL

## 2020-08-05 MED ORDER — SODIUM CHLORIDE 0.9 % INJ (FLUSH) WRAPPED *I*
10.0000 mL | Status: DC | PRN
Start: 2020-08-05 — End: 2020-08-05
  Administered 2020-08-05: 10 mL

## 2020-08-05 MED ORDER — DEXTROSE 5 % IV SOLN WRAPPED *I*
225.0000 mg | Freq: Once | INTRAVENOUS | Status: AC
Start: 2020-08-05 — End: 2020-08-05
  Administered 2020-08-05: 225 mg via INTRAVENOUS
  Filled 2020-08-05: qty 22.5

## 2020-08-05 MED ORDER — DEXAMETHASONE SODIUM PHOSPHATE 10 MG/ML IJ SOLN *I*
8.0000 mg | Freq: Once | INTRAMUSCULAR | Status: AC
Start: 2020-08-05 — End: 2020-08-05

## 2020-08-05 MED ORDER — SODIUM CHLORIDE 0.9 % IV SOLN WRAPPED *I*
80.0000 mg/m2 | Freq: Once | INTRAVENOUS | Status: AC
Start: 2020-08-05 — End: 2020-08-05
  Administered 2020-08-05: 187 mg via INTRAVENOUS
  Filled 2020-08-05: qty 31.17

## 2020-08-05 MED ORDER — FAMOTIDINE (PF) 20 MG/2ML IV SOLN *I*
20.0000 mg | Freq: Once | INTRAVENOUS | Status: AC
Start: 2020-08-05 — End: 2020-08-05

## 2020-08-05 MED ORDER — HEPARIN LOCK FLUSH 10 UNIT/ML IJ SOLN WRAPPED *I*
50.0000 [IU] | INTRAVENOUS | Status: DC | PRN
Start: 2020-08-05 — End: 2020-08-05

## 2020-08-05 MED ORDER — FAMOTIDINE (PF) 20 MG/2ML IV SOLN *I*
INTRAVENOUS | Status: AC
Start: 2020-08-05 — End: 2020-08-05
  Administered 2020-08-05: 20 mg via INTRAVENOUS
  Filled 2020-08-05: qty 2

## 2020-08-05 MED ORDER — PALONOSETRON HCL 0.25 MG/5ML IV SOLN *I*
INTRAVENOUS | Status: AC
Start: 2020-08-05 — End: 2020-08-05
  Administered 2020-08-05: 0.25 mg via INTRAVENOUS
  Filled 2020-08-05: qty 5

## 2020-08-05 MED ORDER — DIPHENHYDRAMINE HCL 50 MG/ML IJ SOLN *I*
INTRAMUSCULAR | Status: AC
Start: 2020-08-05 — End: 2020-08-05
  Administered 2020-08-05: 12.5 mg via INTRAVENOUS
  Filled 2020-08-05: qty 1

## 2020-08-05 MED ORDER — DEXAMETHASONE SOD PHOSPHATE PF 10 MG/ML IJ SOLN *I*
INTRAMUSCULAR | Status: AC
Start: 2020-08-05 — End: 2020-08-05
  Administered 2020-08-05: 8 mg via INTRAVENOUS
  Filled 2020-08-05: qty 1

## 2020-08-05 MED ORDER — DIPHENHYDRAMINE HCL 50 MG/ML IJ SOLN *I*
12.5000 mg | Freq: Once | INTRAMUSCULAR | Status: AC
Start: 2020-08-05 — End: 2020-08-05

## 2020-08-05 MED ORDER — PALONOSETRON HCL 0.25 MG/5ML IV SOLN *I*
0.2500 mg | Freq: Once | INTRAVENOUS | Status: AC
Start: 2020-08-05 — End: 2020-08-05

## 2020-08-05 NOTE — Progress Notes (Signed)
Acupuncture Treatment in Infusion room for Cancer Patient    Date: 08/05/2020  Patient Name: Carly Higgins  DOB: [birth year]    Diagnosis: IDC left , MPN (myeloproliferative neoplasm)    SUBJECTIVE:  Left hip pain for a couple of years. Patient reports nice improvement after last treatment.  OBJECTIVE:   Shen-spirit: good  Tongue body: Normal  Tongue coating: Dry and White  Sublingle Veins: Normal  Pulse Rate:  80  / minute  Pulse Quality: Slippery    TCM ASSESSMENT: Local stagnation  TCM Treatment Principle: even  Posture: right side on flat chair.    Acupuncture Points: DU20 (1), FMS(L) , HIP ASHI (2 L), GB30 (L), GB29 (L), GB31 (L), GB32 (L), GB34 (L), GB39 (L)  Total Needles: 10  Needle Retain: 10 minutes  Other modalities: Tui Na  During treatment notes: Good    Recommendation: Full session acupuncture in [facility]       Patient provided verbal understanding and consent for acupuncture/acupressure in infusion room for the brief session treatment. They have no further questions at this time.    Patient and/ patient visitor complied with masking policy throughout duration of appointment. Writer maintained use of mask and face shield or goggles throughout duration of appointment. Writer was within 6 feet of patient for >5 minutes due to the nature of the treatment.

## 2020-08-05 NOTE — Progress Notes (Addendum)
ECOG Performance     ROS:  Appetite normal yes- too good from steroids  Weight change yes- up 2.6 kg  Fever   no  Night sweats  no  Fatigue  yes- takes naps, two good days a week  Bleeding   no  Cough    no  Dyspnea  yes- on exertion   Chest pain  no  Palpitations  no  Nausea  No-taking compazine  Vomiting  no  Dysphagia  no  Mucositis  no  Diarrhea  no  Constipation  yes- will start to take a stool softner  Abdominal pain no  Urinary symptoms no  Back pain  no  Joint pain  yes - left hip, not new, taking alleve   Bone pain  no  Leg/arm swelling no  Headache  no  Mental status change no  Motor weakness yes- with fatigue  Sensory change yes- in fingers and toes, goes away right away  Gait disturbance no  Rash    no    LMP:  MMG:  GYN:  DEXA:  COLONOSCOPY:

## 2020-08-05 NOTE — Progress Notes (Signed)
Patient presents for Taxol/Carbo infusions. Labs and assessment reviewed and appropriate for treatment. Consent current. Handoff noted- ok to treat.    PIV placed in right wrist, positive blood return noted pre and post treatment.    Premeds Aloxi, Compazine, Decadron, Benadryl and Pepcid given per order followed by a 30 minute wait time.     Taxol infused over 1 hour, Cryotherapy offered but pt declined at this time. Carboplatin over 30 minutes. Patient tolerated treatment well.    PIV flushed and discontinued, site benign and covered with guaze and coban.     Patient discharged to home in stable condition. Knows to call with any questions or concerns.      Patient complied with masking policy throughout duration of appointment. Writer maintaineduse of mask throughout duration of appointment. Writer waswithin 6 feet of patient for > 15 minutes due to the nature of the treatment.

## 2020-08-05 NOTE — Progress Notes (Signed)
[place]     FOLLOW-UP VISIT    PATIENT NAME:  Carly Higgins, Carly Higgins    DOB:  1960-02-28   MRN:  [id number]  08/05/2020     DIAGNOSIS:  Clinical stage IIB left breast cancer    REASON FOR VISIT: Carly Higgins is seen today in anticipation of starting cycle 2 of weekly paclitaxel and carboplatin as neoadjuvant chemotherapy for clinical stage IIB (cT2, cN0, cM0) grade 3, ER negative, PR negative, HER2 negative infiltrating ductal carcinoma of the left breast.    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  In February 2022 she began to experience discomfort in the left breast.  She first attributed this to having been struck with a part of her dog when she picked it up about 1 week earlier.  She continued to experience discomfort intermittently.  She was in [place] at the time and did not seek any evaluation.      When she returned to [place] she scheduled a screening mammogram, having not had an exam since July 2018.  This was performed on May 18, 2020 and revealed an area of architectural distortion in the middle third of the left breast upper outer quadrant at the 2 o'clock position.    On Jun 07, 2020 diagnostic mammogram and breast ultrasound was performed.  The mammogram revealed scattered areas of fibroglandular densities with an area of architectural distortion again noted at the 2 o'clock position of the left breast.  Breast ultrasound revealed an irregular mass measuring 19 x 19 x 12 mm in the middle third upper outer quadrant of the left breast.  Internal echotexture was hypoechoic and there was internal vascularity.      Based on these findings, ultrasound-guided core biopsy of the left breast 2:00 lesion was performed on June 09, 2020.  Biopsy revealed invasive ductal carcinoma.  Provisional combined Nottingham grade was 3 of 3 (tubule formation score 3, nuclear grade 3, mitotic rate  score 2).  Tumor cells were negative for estrogen receptors and progesterone receptors.  Tumor cells were negative for HER2 by immunohistochemical staining with a staining score of 1+.  Ki-67 staining was seen in approximately 70% of tumor cells.    She was seen by Dr. Freda Munro for surgical consultation at which time she was noted to have a masslike area at the 2 o'clock position of the left breast which felt like a hematoma.  Staging scans were requested.    She was seen in the [facility] clinic on June 13, 2020. Because of her prior allogeneic bone marrow transplant, she was not eligible for standard germline testing with blood or saliva specimen.      Whole-body bone scan performed on June 20, 2020 revealed no abnormal focus of radiotracer uptake to suggest osseous metastatic disease.    CT of the chest performed with contrast on June 22, 2020 revealed an ill-defined enhancing lesion in the lateral left breast measuring 1.7 x 1.2 x 1.1 cm.  There were asymmetrically prominent level 1 left axillary lymph nodes that demonstrated enhancement of uncertain significance.  These were felt possibly to represent metastatic disease.  There were no enlarged mediastinal, hilar, intramammary or supraclavicular lymph nodes.  No suspicious pulmonary nodules were identified.  CT of the abdomen pelvis performed at the same time revealed no evidence of metastatic disease within the abdomen and pelvis.  A small fat filled umbilical hernia  was noted.    MRI of the breast performed on June 28, 2020 revealed an irregular mass with spiculated margins measuring 10 x 25 x 19 mm in the left breast upper outer quadrant at the 2 o'clock position located 3 cm from the nipple.    Neoadjuvant chemotherapy was recommended and she began cycle 1 of weekly paclitaxel and carboplatin on July 15, 2020.    INTERVAL HISTORY:  She reports that she has tolerated chemotherapy reasonably well so far.  She has noted an increase in  appetite which she attributes to dexamethasone which she receives as a premedication for her treatment.  As a consequence, she has gained some weight.  She denies recent fever or symptoms of infection.  She denies drenching sweats.  She has mild to moderate fatigue and states she is taking naps on a more regular basis.  She denies mouth sores.  She has some mild dyspnea with strenuous exertion.  She denies cough.  She has no cardiac symptoms including exertional chest pain or palpitations.  She has constipation for which she has been using a stool softener.  Otherwise she has no gastrointestinal symptoms including nausea, vomiting, dysphagia, abdominal pain and bloating.  She has no urinary symptoms.  She continues to have left hip pain and states that she has been taking naproxen 220 mg twice a day.  She has no other musculoskeletal symptoms including back pain or bone pain.  She denies swelling of her extremities.  She denies headaches.  She reports no cognitive changes.  She has generalized weakness without focal motor deficits.  She has noted some mild tingling in her fingers and toes which is intermittent and lasts only a brief time.  She denies problems with balance and coordination.  She has no dermatologic symptoms.    She reports that she has been smoking several cigarettes a day.    PAST MEDICAL/SURGICAL HISTORY:   1. 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  Prior to bone marrow transplant she received 5 cycles of decitabine.  She received fludarabine, busulfan and and hydroxyurea for the transplant and received methotrexate for graft-versus-host prophylaxis.  2. Hypothyroid.  3. Anxiety.  4. Osteoarthritis of the hips, left greater than right.  5. Mild diverticulosis of the sigmoid colon.  6. History of atrial fibrillation and congestive heart failure in 2017 related to the bone  marrow transplant.  7. Right cataract surgery.  8. Right rotator cuff surgery.    MEDICATIONS:     Current Outpatient Medications   Medication Sig    acyclovir (ZOVIRAX) 400 mg tablet Take 400 mg by mouth 2 times daily    prochlorperazine (COMPAZINE) 10 mg tablet Take 1 tablet (10 mg total) by mouth every 6 hours as needed (nausea)    metoprolol tartrate (LOPRESSOR) 50 mg tablet Take 1 tablet (50 mg total) by mouth 2 times daily    cholecalciferol (VITAMIN D) 50 MCG (2000 UT) tablet Take 1,000 Units by mouth daily    levothyroxine (SYNTHROID, LEVOTHROID) 50 MCG tablet Take 50 mcg by mouth daily (before breakfast)    escitalopram (LEXAPRO) 20 MG tablet Take 20 mg by mouth daily    aspirin 81 MG EC tablet Take 81 mg by mouth daily    ondansetron (ZOFRAN) 8 mg tablet Take 1 tablet (8 mg total) by mouth every 8 hours as needed  Begin 72 hours after treatment if needed for  nausea (Patient not taking: Reported on 07/15/2020)    ondansetron (ZOFRAN) 4 mg tablet Take 1 tablet (4 mg total) by mouth 3 times daily as needed (nausea) (Patient not taking: Reported on 08/05/2020)       ROS: As outlined above and in the nursing assessment. Comprehensive review of systems is otherwise negative.        Vitals:    08/05/20 0821   BP: 139/85   Pulse: 77   Temp: 36.6 C (97.9 F)   TempSrc: Temporal   SpO2: 96% on room air    Weight: 123.3 kg (271 lb 13.2 oz)       PHYSICAL EXAMINATION: General: Reveals a well-appearing woman. ECOG performance status is 1.  Weight is up 2.6 kg from last visit. HEENT exam: grossly unremarkable.  Sclerae are anicteric. Conjunctivae are pink.  Oral mucosa is without stomatitis or thrush. Lymph nodes: There is no palpable cervical, supraclavicular, axillary or inguinal lymph node enlargement. Lungs: clear to auscultation and percussion.  Cardiac exam:  reveals a regular rate and rhythm. No murmur is noted. Breasts: Left breast has a palpable mass in the upper outer quadrant measuring approximately  3.5 cm.  Overlying skin is unremarkable.  Abdomen: is soft.  Bowel sounds are normoactive.  There is no hepatosplenomegaly, mass or ascites detected by palpation and percussion..  Extremities: are without edema.  Neurologic exam: is grossly nonfocal. Skin: is without rash, petechiae or ecchymoses.  Again noted are patches of vitiligo in the upper abdomen.    LABORATORY DATA:     Comprehensive metabolic panel    Collection Time: 08/04/20  8:24 AM   Result Value Ref Range    Sodium 138 133 - 145 mmol/L    Potassium 4.9 3.3 - 5.1 mmol/L    Chloride 101 96 - 108 mmol/L    CO2 26 20 - 28 mmol/L    Anion Gap 11 7 - 16    UN 19 6 - 20 mg/dL    Creatinine 0.69 0.51 - 0.95 mg/dL    Glucose 102 (H) 60 - 99 mg/dL    Calcium 9.4 8.6 - 10.2 mg/dL    Total Protein 6.4 6.3 - 7.7 g/dL    Albumin 4.1 3.5 - 5.2 g/dL    Bilirubin,Total 0.4 0.0 - 1.2 mg/dL    AST 34 0 - 35 U/L    ALT 51 (H) 0 - 35 U/L    Alk Phos 88 35 - 105 U/L      CBC and differential    Collection Time: 08/04/20  8:24 AM   Result Value Ref Range    WBC 5.4 4.0 - 10.0 THOU/uL    RBC 3.9 3.9 - 5.2 MIL/uL    Hemoglobin 12.1 11.2 - 15.7 g/dL    Hematocrit 37 34 - 45 %    MCV 95 79 - 95 fL    MCH 31 26 - 32 pg    MCHC 33 32 - 36 g/dL    RDW 15.3 (H) 11.7 - 14.4 %    Platelets 250 160 - 370 THOU/uL    Seg Neut % 41.3 %    Lymphocyte % 44.0 %    Monocyte % 8.9 %    Eosinophil % 4.1 %    Basophil % 1.1 %    Neut # K/uL 2.2 1.6 - 6.1 THOU/uL    Lymph # K/uL 2.4 1.2 - 3.7 THOU/uL    Mono # K/uL 0.5 0.2 - 0.9 THOU/uL  Eos # K/uL 0.2 0.0 - 0.4 THOU/uL    Baso # K/uL 0.1 0.0 - 0.1 THOU/uL    Nucl RBC % 0.0 0.0 - 0.2 /100 WBC    Nucl RBC # K/uL 0.0 0.0 - 0.0 THOU/uL    IMM Granulocytes # 0.0 0.0 - 0.0 THOU/uL    IMM Granulocytes 0.6 %      Lactate dehydrogenase    Collection Time: 08/04/20  8:24 AM   Result Value Ref Range    LD 188 118 - 225 U/L       IMAGING DATA: No new imaging data    ASSESSMENT: Ms. Huizar has tolerated the first cycle of weekly paclitaxel and carboplatin  reasonably well.  Still too early to tell how well her disease will respond to the neoadjuvant treatment, but there is no clear evidence of disease progression and the mass may in fact be slightly smaller on today's exam.  I have recommended that she start cycle 2 today.  She is agreeable to this.  She will return for follow-up in 3 weeks in anticipation of starting cycle 3.    She has been receiving her treatment through a peripheral IV.  We will make a referral for Mediport placed.    She has been having hip pain related to osteoarthritis.  She has expressed interest in massage therapy and acupuncture.  Will be referred to [facility] for this.    She has been smoking cigarettes.  I have recommended that she try if possible to stop smoking.    PLAN:     Clinical stage IIB triple negative left breast cancer     Start cycle 2 of weekly paclitaxel and carboplatin today.  Treatment will consist of paclitaxel 80 mg/m and carboplatin AUC of 1.5 administered on days 1, 8 and 15 of each 21-day cycle.     Follow-up in 3 weeks anticipation of starting cycle 3 of planned 4 cycles of paclitaxel and carboplatin.     Arrange for Mediport placement within the next few weeks.     After completion of 4 cycles of paclitaxel and carboplatin, patient will receive 4 cycles of doxorubicin 60 mg/m and cyclophosphamide 600 mg/m on day 1 of each 21-day cycle.     Hold on immunotherapy with pembrolizumab in light of her previous allogenic bone marrow transplant.     Surgical resection after completion of neoadjuvant chemotherapy.     Referral to [facility] for acupuncture and massage.    COVID-19     Patient has received the [facility] COVID-19 vaccine, receiving the first dose on May 15, 2019, second dose on Jun 05, 2019 and booster dose on October 08, 2019.     She is eligible for a second booster dose.    I have recommend that she consider getting this in the next few months, preferably with the newer  version of the vaccine when it becomes available.     Patient advised to continue to take precautions to reduce risk of contracting COVID-19, including social distancing, frequent handwashing and using a mask when in public.    Health maintenance     Patient encouraged to stop smoking cigarettes entirely.     Recommend patient receive flu vaccine in 2 to 3 months.    Follow-up     Return for follow-up in 3 weeks.    Due to concerns regarding COVID-19, a facemask was worn by myself as well as the patient throughout the office visit, except lately  to allow for inspection of the oral cavity.      [name], MD    HE:[id number] Fredda Hammed, MD  Minus Liberty, MD  Assunta Curtis, MD

## 2020-08-05 NOTE — Progress Notes (Addendum)
[facility] [facility] TREATMENT HAND-OFF TOOL:   SITUATION:   Scheduled treatment category for today:     Cancer treatment:  Chemotherapy    Is this a new cancer treatment?: No      Patient seen by MD    Consent obtained:  Yes    Location:  [facility]    Labs complete:  Yes    Within parameters: Yes      Current patient status:  Scheduled treatment    OK to treat for scheduled treatment:  Yes    Patient is okay for paclitaxel and carboplatin treatment today (08/05/2020).  [name], MD

## 2020-08-08 ENCOUNTER — Telehealth: Payer: Self-pay

## 2020-08-08 NOTE — Telephone Encounter (Signed)
LVM for pt to call the office to schedule acupuncture appointments

## 2020-08-11 ENCOUNTER — Other Ambulatory Visit
Admission: RE | Admit: 2020-08-11 | Discharge: 2020-08-11 | Disposition: A | Discharge status: Home / Self Care | Payer: Medicare Other | Source: Ambulatory Visit | Attending: Hematology and Oncology | Admitting: Hematology and Oncology

## 2020-08-11 ENCOUNTER — Other Ambulatory Visit: Payer: Self-pay | Admitting: Hematology and Oncology

## 2020-08-11 DIAGNOSIS — D471 Chronic myeloproliferative disease: Secondary | ICD-10-CM | POA: Insufficient documentation

## 2020-08-11 DIAGNOSIS — Z171 Estrogen receptor negative status [ER-]: Secondary | ICD-10-CM | POA: Insufficient documentation

## 2020-08-11 DIAGNOSIS — C50412 Malignant neoplasm of upper-outer quadrant of left female breast: Secondary | ICD-10-CM | POA: Insufficient documentation

## 2020-08-11 LAB — CBC AND DIFFERENTIAL
Baso # K/uL: 0.1 10*3/uL (ref 0.0–0.1)
Basophil %: 1.2 %
Eos # K/uL: 0.2 10*3/uL (ref 0.0–0.4)
Eosinophil %: 4.4 %
Hematocrit: 34 % (ref 34–45)
Hemoglobin: 11.5 g/dL (ref 11.2–15.7)
IMM Granulocytes #: 0 10*3/uL (ref 0.0–0.0)
IMM Granulocytes: 0.5 %
Lymph # K/uL: 1.6 10*3/uL (ref 1.2–3.7)
Lymphocyte %: 38.7 %
MCH: 31 pg (ref 26–32)
MCHC: 34 g/dL (ref 32–36)
MCV: 93 fL (ref 79–95)
Mono # K/uL: 0.2 10*3/uL (ref 0.2–0.9)
Monocyte %: 5.1 %
Neut # K/uL: 2.1 10*3/uL (ref 1.6–6.1)
Nucl RBC # K/uL: 0 10*3/uL (ref 0.0–0.0)
Nucl RBC %: 0 /100 WBC (ref 0.0–0.2)
Platelets: 207 10*3/uL (ref 160–370)
RBC: 3.7 MIL/uL — ABNORMAL LOW (ref 3.9–5.2)
RDW: 15.5 % — ABNORMAL HIGH (ref 11.7–14.4)
Seg Neut %: 50.1 %
WBC: 4.1 10*3/uL (ref 4.0–10.0)

## 2020-08-11 LAB — COMPREHENSIVE METABOLIC PANEL
ALT: 56 U/L — ABNORMAL HIGH (ref 0–35)
AST: 34 U/L (ref 0–35)
Albumin: 4.2 g/dL (ref 3.5–5.2)
Alk Phos: 89 U/L (ref 35–105)
Anion Gap: 12 (ref 7–16)
Bilirubin,Total: 0.4 mg/dL (ref 0.0–1.2)
CO2: 22 mmol/L (ref 20–28)
Calcium: 9.4 mg/dL (ref 8.6–10.2)
Chloride: 102 mmol/L (ref 96–108)
Creatinine: 0.63 mg/dL (ref 0.51–0.95)
Glucose: 199 mg/dL — ABNORMAL HIGH (ref 60–99)
Lab: 14 mg/dL (ref 6–20)
Potassium: 4.6 mmol/L (ref 3.3–5.1)
Sodium: 136 mmol/L (ref 133–145)
Total Protein: 6.5 g/dL (ref 6.3–7.7)
eGFR BY CREAT: 101 *

## 2020-08-11 LAB — LACTATE DEHYDROGENASE: LD: 198 U/L (ref 118–225)

## 2020-08-11 LAB — NEUTROPHIL #-INSTRUMENT: Neutrophil #-Instrument: 2.1 10*3/uL

## 2020-08-11 LAB — MULTIPLE ORDERING DOCS

## 2020-08-12 ENCOUNTER — Encounter: Payer: Self-pay | Admitting: Hematology and Oncology

## 2020-08-12 ENCOUNTER — Ambulatory Visit: Payer: Medicare Other | Attending: Hematology and Oncology

## 2020-08-12 ENCOUNTER — Telehealth: Payer: Self-pay | Admitting: Hematology and Oncology

## 2020-08-12 VITALS — BP 149/84 | HR 94 | Temp 97.7°F | Resp 18 | Wt 266.8 lb

## 2020-08-12 DIAGNOSIS — Z171 Estrogen receptor negative status [ER-]: Secondary | ICD-10-CM

## 2020-08-12 DIAGNOSIS — C50412 Malignant neoplasm of upper-outer quadrant of left female breast: Secondary | ICD-10-CM | POA: Insufficient documentation

## 2020-08-12 DIAGNOSIS — Z5111 Encounter for antineoplastic chemotherapy: Secondary | ICD-10-CM | POA: Insufficient documentation

## 2020-08-12 DIAGNOSIS — D471 Chronic myeloproliferative disease: Secondary | ICD-10-CM | POA: Insufficient documentation

## 2020-08-12 MED ORDER — SEQUENCING OF DRUGS FOR ADMINISTRATION *I*
1.0000 | Status: DC | PRN
Start: 2020-08-12 — End: 2020-08-12
  Filled 2020-08-12: qty 1

## 2020-08-12 MED ORDER — DEXAMETHASONE SODIUM PHOSPHATE 10 MG/ML IJ SOLN *I*
8.0000 mg | Freq: Once | INTRAMUSCULAR | Status: AC
Start: 2020-08-12 — End: 2020-08-12

## 2020-08-12 MED ORDER — SODIUM CHLORIDE 0.9 % IV SOLN WRAPPED *I*
80.0000 mg/m2 | Freq: Once | INTRAVENOUS | Status: AC
Start: 2020-08-12 — End: 2020-08-12
  Administered 2020-08-12: 187 mg via INTRAVENOUS
  Filled 2020-08-12: qty 31.17

## 2020-08-12 MED ORDER — DIPHENHYDRAMINE HCL 50 MG/ML IJ SOLN *I*
INTRAMUSCULAR | Status: AC
Start: 2020-08-12 — End: 2020-08-12
  Administered 2020-08-12: 12.5 mg via INTRAVENOUS
  Filled 2020-08-12: qty 1

## 2020-08-12 MED ORDER — SODIUM CHLORIDE 0.9 % IV SOLN WRAPPED *I*
30.0000 mL/h | Status: DC | PRN
Start: 2020-08-12 — End: 2020-08-12
  Administered 2020-08-12: 30 mL/h
  Administered 2020-08-12: 595 mL/h
  Administered 2020-08-12: 30 mL/h via INTRAVENOUS
  Administered 2020-08-12: 20 mL/h
  Administered 2020-08-12: 30 mL/h
  Administered 2020-08-12: 20 mL/h
  Administered 2020-08-12 (×2): 100 mL/h
  Administered 2020-08-12: 30 mL/h

## 2020-08-12 MED ORDER — DIPHENHYDRAMINE HCL 50 MG/ML IJ SOLN *I*
12.5000 mg | Freq: Once | INTRAMUSCULAR | Status: AC
Start: 2020-08-12 — End: 2020-08-12

## 2020-08-12 MED ORDER — DEXAMETHASONE SOD PHOSPHATE PF 10 MG/ML IJ SOLN *I*
INTRAMUSCULAR | Status: AC
Start: 2020-08-12 — End: 2020-08-12
  Administered 2020-08-12: 0 mg
  Administered 2020-08-12: 8 mg via INTRAVENOUS
  Filled 2020-08-12: qty 1

## 2020-08-12 MED ORDER — HEPARIN LOCK FLUSH 10 UNIT/ML IJ SOLN WRAPPED *I*
50.0000 [IU] | INTRAVENOUS | Status: DC | PRN
Start: 2020-08-12 — End: 2020-08-12

## 2020-08-12 MED ORDER — PALONOSETRON HCL 0.25 MG/5ML IV SOLN *I*
0.2500 mg | Freq: Once | INTRAVENOUS | Status: AC
Start: 2020-08-12 — End: 2020-08-12

## 2020-08-12 MED ORDER — FAMOTIDINE (PF) 20 MG/2ML IV SOLN *I*
20.0000 mg | Freq: Once | INTRAVENOUS | Status: AC
Start: 2020-08-12 — End: 2020-08-12

## 2020-08-12 MED ORDER — SODIUM CHLORIDE 0.9 % INJ (FLUSH) WRAPPED *I*
10.0000 mL | Status: DC | PRN
Start: 2020-08-12 — End: 2020-08-12
  Administered 2020-08-12: 10 mL

## 2020-08-12 MED ORDER — FAMOTIDINE (PF) 20 MG/2ML IV SOLN *I*
INTRAVENOUS | Status: AC
Start: 2020-08-12 — End: 2020-08-12
  Administered 2020-08-12: 20 mg via INTRAVENOUS
  Filled 2020-08-12: qty 2

## 2020-08-12 MED ORDER — PALONOSETRON HCL 0.25 MG/5ML IV SOLN *I*
INTRAVENOUS | Status: AC
Start: 2020-08-12 — End: 2020-08-12
  Administered 2020-08-12: 0.25 mg via INTRAVENOUS
  Filled 2020-08-12: qty 5

## 2020-08-12 MED ORDER — PROCHLORPERAZINE MALEATE 10 MG PO TABS *I*
10.0000 mg | ORAL_TABLET | Freq: Once | ORAL | Status: AC
Start: 2020-08-12 — End: 2020-08-12
  Administered 2020-08-12: 10 mg via ORAL

## 2020-08-12 MED ORDER — DEXTROSE 5 % IV SOLN WRAPPED *I*
225.0000 mg | Freq: Once | INTRAVENOUS | Status: AC
Start: 2020-08-12 — End: 2020-08-12
  Administered 2020-08-12: 225 mg via INTRAVENOUS
  Filled 2020-08-12: qty 22.5

## 2020-08-12 NOTE — Progress Notes (Signed)
Patient admitted to infusion clinic ambulatory with complaint of chronic left hip pain for Taxol Carboplatin. Labs and consent noted. Venous access placed right hand with positive blood return. Patient pre medicated. Patient tolerated infusions. Positive blood return from venous access. Venous access discontinued per policy. Patient discharged home in stable condition.    Patient complied with masking policy throughout duration of appointment. Writer maintained use of mask  throughout duration of appointment. Writer was within 6 feet of patient for > 5 minutes due to the nature of the treatment.

## 2020-08-12 NOTE — Telephone Encounter (Addendum)
Writer called and received fax number. Writer faxed order for port placement to [phone].

## 2020-08-15 ENCOUNTER — Telehealth: Payer: Self-pay | Admitting: Hematology and Oncology

## 2020-08-15 LAB — HEMOGLOBIN A1C: Hemoglobin A1C: 6.5 % — ABNORMAL HIGH

## 2020-08-15 NOTE — Telephone Encounter (Signed)
Calling to let the team know that pt can't afford the port placement. Writer made her aware that pt just called.

## 2020-08-15 NOTE — Telephone Encounter (Signed)
Pt called back and scheduled her appointment for Tuesday the 9th at 9:30 am.

## 2020-08-15 NOTE — Telephone Encounter (Signed)
Pt states [facility] reached out in regards to the port placement, and they told her it was going to be $1,300 which she cannot afford. States last time she had one put in, it was through [facility], and free. Wants to know if she is able to go through [facility] again.

## 2020-08-15 NOTE — Telephone Encounter (Signed)
Calling to get a copy on 7/29 office note and medication list. Fax is [phone]

## 2020-08-16 ENCOUNTER — Ambulatory Visit: Payer: Medicare Other | Admitting: Acupuncturist

## 2020-08-16 NOTE — Telephone Encounter (Signed)
After reaching out to [facility] for clarification it was discovered that they did not have patient's medicare coverage on file.  Once current insurance coverage was verified but had no out of pocket cost for the port placement.  Patient will be having port placed at [facility] on Thursday as previously scheduled.  Patient is aware and verbalized understanding and appreciation.

## 2020-08-16 NOTE — Progress Notes (Signed)
Acupuncture Treatment Form For Cancer Patient    Date: 08/16/2020  Patient Name: Carly Higgins, DOB: 1960/03/05    Diagnosis: Malignant neoplasm of upper-outer quadrant of left breast in female.     Subjective/Chief Complaint: Patient reports had no chief complaint listed for this encounter.    TCM Subjective:  Expression: Calm  Complexion: Normal  Voice: Normal  Inquiring: Thirsty and Cold Drinks  Appetite: Normal  Life Style: Tea and Water  Bowels: Constipation  Urine: Normal Color  General Body Aching: Neuropathy  Pain Characters: Tingling  Pain Level: 0  Numbness Level: 4  Mental: Sleep Good  Menstrual: None  Dizziness: no  Ringing in the Ear:  No     TCM Objective: Patient appears calm  Tongue Body: Pale  Coating: Thick, Dry and White  Pulse Characters: Left Side: Slow and Weak Right Side: Slow and Weak     Patient past history, current pertinent medical history, lab results, acupuncture procedure, benefits limitation were discussed with patient as needed. They have no further questions at this time.    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    Position Supine:  '[]'$  Scalp '[]'$  Forehead '[]'$  Face '[x]'$  Neck '[]'$  Shoulders '[]'$  Arms '[]'$  Hands '[]'$  Upper legs '[]'$  Lower legs '[]'$  Feet  Position Prone:  '[]'$  Left side-lying '[]'$  Right side-lying '[]'$  Back '[]'$  Neck '[]'$  Head '[]'$  Upper legs '[]'$  Lower legs '[]'$  Feet  Position Support:  '[]'$  Upper body incline '[x]'$  Head support '[x]'$  Knee support '[]'$  Breast bolster '[]'$  Shoulder support    Cancer Treatment Points:  1. Improve White Blood Cells and Boost Energy:  ??R 14      '[]'$    ??BL 17      '[]'$   ??R 6        '[]'$   ??R 4          '[]'$   ??R 14      '[]'$   ??UB 11 '[]'$   ??UB 20   '[]'$   ??UG 18    '[]'$   ??UB 21 '[]'$   ??GB 39   '[]'$    ??SP 10    '[]'$   ??P 6        '[]'$    ??R 4         '[]'$   ??R 6       '[]'$   ??BL 17    '[]'$    ??UB 20     '[]'$   ??K 3        '[x]'$    ???ST 36  '[x]'$    ??LR 3       '[]'$    ???SP 6    '[x]'$  Head 5 needles and extra in feet and hands     2. Warm Ronny Flurry and Tonic Qi (pale face and fatigue):  ??R 4  '[]'$  ??R 14 '[]'$   ??DU 4 '[]'$  ??UB 43 '[]'$   ??R 6  '[]'$  ???ST 36 '[x]'$   ???UB 24 '[]'$  UB 52  '[]'$   ??DU 20 '[x]'$  ??R 14 '[]'$     3. Spleen and Kidney Tonic (nausea or vomiting)  ???ST 36 '[x]'$  ???SP 6 '[x]'$    ??LI 11 '[x]'$  ??PR 11  '[x]'$   ????/?????  ??SP 10 '[]'$  ??R 6  '[]'$   ??R 6  '[]'$  ??R 4  '[]'$   ??P 6  '[]'$   ??  UB 17 '[]'$   ??P 6  '[]'$  ??K 6  '[]'$   ??P 7  '[]'$  ??SJ 5 '[]'$   ??SP 18 '[]'$  ??GB 43 '[]'$   ??LI 20 '[]'$  ?? LI 11 '[x]'$   ??BU 15 '[]'$  ??UB 20 '[]'$   ??PR 6 '[]'$   ????????  ??HT 8 '[]'$  ???ST 36 '[]'$     4.Tonic Qi and Ying (posible phase IV)  ???SP 9 '[x]'$  ???GB 34  '[x]'$   ??  ???Sp 9 '[]'$   ??K 1  '[]'$   ??R 14 '[]'$  ??BL 17 '[]'$   ??K 3  '[]'$  ???ST 36 '[]'$   ???SP 6 '[]'$  ???ST 36 '[]'$    ???SP 6 '[]'$  ??R 6  '[]'$   ??UB 20 '[]'$   ??UG 18 '[]'$   ??UB 21 '[]'$   ??R 6  '[]'$  ??R 4  '[]'$   ??K 6  '[]'$  ??R 6  '[]'$     5. Lump Soften or Expel Mucus  ??ST 40 '[]'$  ??SP 4 '[]'$   ??LI 2 '[]'$  ??LU10 '[]'$   ??Heart 3 '[]'$  ??Pericardium 5 '[]'$   ??SJ 5 '[]'$   ??LI 4 '[]'$   ??Pericardium 3 '[]'$   ??SJ 5 '[x]'$   ??UB 20 '[]'$  ??SJ 10 '[]'$   ??Lung 5 '[]'$   ?? Si1 '[]'$  ?? ??   ??Lung 9 '[]'$  ??Heart 7 '[]'$   ???? ?? ????     ??Lung 5 '[]'$  ?? SI1  '[]'$  ?? ??   ??LU 7 '[]'$  ???ST 36 '[]'$   ????  ??SP 4 '[]'$  ?? P 6  '[]'$  ???     Ear Acupuncture points:  '[]'$  ??? Endocrine IC 8  '[]'$  ???? Sympathetic Nerve AH4  '[]'$  ???? Sciatica AH8  '[]'$  ??? Lumbar Vertebrae AH10  '[]'$  ?? Thoracic Vertebrae AH11  '[]'$  ? Knees AH3   '[]'$  ??? Shoulders Joints SF7   '[]'$  ? Elbow SF 5  '[]'$  ? Wrists SF2  '[x]'$  ? Fingers SF1   '[]'$  ? Toe AH1  '[x]'$  ??? Ankle AH2   '[x]'$  ?? Tf1     Total Needles 47  Infrared Heat Lamp Applied Yes '[x]'$  No '[]'$   Patient response(s) to acupuncture treatment: patient reports positive benefits.      Plan / Recommendations:  Continue comfort acupuncture addressing specific symptoms as indicated.  Patient received 1 complementary acupuncture sessions.   Patient is eligible for 3 additional sessions .    Musical  Therapy: Argie Ramming, [place], Marily Memos, [place], [place], [place], [place], [place] Rachmaninoff and [place]     Patient complied with masking policy throughout duration of appointment. Corinna Gab maintained use of a PPE throughout duration of appointment. Corinna Gab was within 6 feet of patient for less than 5 minutes due to the nature of the treatment.Acupuncture Treatment Form For Cancer Patient    Date: 08/16/2020  Patient Name: Carly Higgins, DOB: 08/14/1960    Diagnosis: There were no encounter diagnoses.    Subjective/Chief Complaint: Patient reports had no chief complaint listed for this encounter.    TCM Subjective:  Expression: Calm  Complexion: Normal  Voice: Normal  Inquiring: Thirsty and Cold Drinks  Appetite: Normal  Life Style: Tea and Water  Bowels: Constipation  Urine: Normal Color  General Body Aching: Neuropathy  Pain Characters: Ache  Pain Level: 2  Numbness Level: 4  Mental: Insomnia  Menstrual: None  Dizziness: No  Ringing in the Ear:  No     TCM Objective: Patient appears calm  Tongue Body: Pale  Coating: Dry and White  Pulse Characters: Left Side: Slow and Weak Right Side: Slow and Weak  Patient past history, current pertinent medical history, lab results, acupuncture procedure, benefits limitation were discussed with patient as needed. They have no further questions at this time.    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    Position Supine:  '[]'$  Scalp '[]'$  Forehead '[]'$  Face '[x]'$  Neck '[]'$  Shoulders '[]'$  Arms '[]'$  Hands '[]'$  Upper legs '[]'$  Lower legs '[]'$  Feet  Position Prone:  '[]'$  Left side-lying '[]'$  Right side-lying '[]'$  Back '[]'$  Neck '[]'$  Head '[]'$  Upper legs '[]'$  Lower legs '[]'$  Feet  Position Support:  '[]'$  Upper body incline '[x]'$  Head  support '[x]'$  Knee support '[]'$  Breast bolster '[]'$  Shoulder support    Cancer Treatment Points:  1. Improve White Blood Cells and Boost Energy:  ??R 14      '[]'$    ??BL 17      '[]'$   ??R 6        '[]'$   ??R 4          '[]'$   ??R 14      '[]'$   ??UB 11 '[]'$   ??UB 20   '[]'$   ??UG 18    '[]'$   ??UB 21 '[]'$   ??GB 39   '[]'$    ??SP 10    '[x]'$   ??P 6        '[]'$    ??R 4         '[x]'$   ??R 6      '[]'$   ??BL 17    '[]'$    ??UB 20     '[]'$   ??K 3        '[x]'$    ???ST 36  '[x]'$   ??LR 3       '[x]'$    ???SP 6    '[x]'$  Head 5 needles and extra in hands and feet    2. Warm Ronny Flurry and Tonic Qi (pale face and fatigue):  ??R 4  '[]'$  ??R 14 '[]'$   ??DU 4 '[]'$  ??UB 43 '[]'$   ??R 6  '[]'$  ???ST 36 '[x]'$   ???UB 24 '[]'$  UB 52  '[]'$   ??DU 20 '[x]'$  ??R 14 '[]'$     3. Spleen and Kidney Tonic (nausea or vomiting)  ???ST 36 '[x]'$  ???SP 6 '[x]'$    ??LI 11 '[x]'$  ??PR 11  '[]'$   ????/?????  ??SP 10 '[x]'$  ??R 6  '[]'$   ??R 6  '[]'$  ??R 4  '[]'$   ??P 6  '[]'$   ??UB 17 '[]'$   ??P 6  '[]'$  ??K 6  '[]'$   ??P 7  '[]'$  ??SJ 5 '[x]'$   ??SP 18 '[]'$  ??GB 43 '[]'$   ??LI 20 '[]'$  ?? LI 11 '[]'$   ??BU 15 '[]'$  ??UB 20 '[]'$   ??PR 6 '[]'$   ????????  ??HT 8 '[]'$  ???ST 36 '[]'$     4.Tonic Qi and Ying (posible phase IV)  ???SP 9 '[x]'$  ???GB 34  '[x]'$   ??  ???Sp 9 '[]'$   ??K 1  '[]'$   ??R 14 '[]'$  ??BL 17 '[]'$   ??K 3  '[]'$  ???ST 36 '[]'$   ???SP 6 '[]'$  ???ST 36 '[]'$    ???SP 6 '[]'$  ??R 6  '[]'$   ??UB 20 '[]'$   ??UG 18 '[]'$   ??UB 21 '[]'$   ??R 6  '[]'$  ??R 4  '[]'$   ??K 6  '[]'$  ??R 6  '[]'$     5. Lump Soften or Expel  Mucus  ??ST 40 '[]'$  ??SP 4 '[]'$   ??LI 2 '[]'$  ??LU10 '[]'$   ??Heart 3 '[]'$  ??Pericardium 5 '[]'$   ??SJ 5 '[]'$   ??LI 4 '[]'$   ??Pericardium 3 '[]'$   ??SJ 5 '[]'$   ??UB 20 '[]'$  ??SJ 10 '[]'$   ??Lung 5 '[]'$   ?? Si1 '[]'$  ?? ??   ??Lung 9 '[]'$  ??Heart 7 '[]'$   ???? ?? ????     6. Promote Blood Flow and Dissolving Stagnation:  Ashi Points  ???SP 6 '[x]'$  ??LI 4 '[x]'$   ??SP 10 '[]'$  ??Pericardium 3 '[]'$   ??BL 17 '[]'$  ??UB 20 '[]'$  ??UB 21      '[]'$   ??UB 40 '[]'$  ??BL 17 '[]'$  ???UB 22   '[]'$   ??Lung 5 '[]'$  ??ST 44 '[]'$   ??LR 14 '[]'$  ???GB 34 '[]'$  ??DU20       '[]'$   ??SP 18 '[]'$  ?? GB 43  '[]'$  ?? ???    Ear Acupuncture points:  '[]'$  ??? Endocrine IC 8  '[]'$  ???? Sympathetic Nerve AH4  '[]'$  ???? Sciatica AH8  '[]'$  ??? Lumbar  Vertebrae AH10  '[]'$  ?? Thoracic Vertebrae AH11  '[]'$  ? Knees AH3   '[]'$  ??? Shoulders Joints SF7   '[]'$  ? Elbow SF 5  '[]'$  ? Wrists SF2  '[x]'$  ? Fingers SF1   '[x]'$  ? Toe AH1  '[]'$  ??? Ankle AH2   '[x]'$  ?? Tf1     Total Needles 47  Infrared Heat Lamp Applied Yes '[x]'$  No '[]'$   Patient response(s) to acupuncture treatment: patient reports positive benefits.    Plan / Recommendations:  Continue comfort acupuncture addressing specific symptoms as indicated.  Patient received 1 complementary acupuncture sessions.   Patient is eligible for 3 additional sessions .    Musical Therapy: Argie Ramming, [place], [place], [place], [place] Rachmaninoff and [facility]

## 2020-08-16 NOTE — Telephone Encounter (Signed)
Resolved.  See previous telephone encounter.

## 2020-08-16 NOTE — Telephone Encounter (Signed)
Note and medication lost sent.

## 2020-08-18 ENCOUNTER — Other Ambulatory Visit: Payer: Self-pay | Admitting: Hematology and Oncology

## 2020-08-18 ENCOUNTER — Encounter: Payer: Self-pay | Admitting: Gastroenterology

## 2020-08-18 ENCOUNTER — Other Ambulatory Visit
Admission: RE | Admit: 2020-08-18 | Discharge: 2020-08-18 | Disposition: A | Discharge status: Home / Self Care | Payer: Medicare Other | Source: Ambulatory Visit | Attending: Hematology and Oncology | Admitting: Hematology and Oncology

## 2020-08-18 DIAGNOSIS — C50412 Malignant neoplasm of upper-outer quadrant of left female breast: Secondary | ICD-10-CM | POA: Insufficient documentation

## 2020-08-18 DIAGNOSIS — D471 Chronic myeloproliferative disease: Secondary | ICD-10-CM | POA: Insufficient documentation

## 2020-08-18 DIAGNOSIS — Z171 Estrogen receptor negative status [ER-]: Secondary | ICD-10-CM | POA: Insufficient documentation

## 2020-08-18 LAB — COMPREHENSIVE METABOLIC PANEL
ALT: 53 U/L — ABNORMAL HIGH (ref 0–35)
AST: 35 U/L (ref 0–35)
Albumin: 4.3 g/dL (ref 3.5–5.2)
Alk Phos: 93 U/L (ref 35–105)
Anion Gap: 11 (ref 7–16)
Bilirubin,Total: 0.3 mg/dL (ref 0.0–1.2)
CO2: 27 mmol/L (ref 20–28)
Calcium: 9.4 mg/dL (ref 8.6–10.2)
Chloride: 102 mmol/L (ref 96–108)
Creatinine: 0.7 mg/dL (ref 0.51–0.95)
Glucose: 148 mg/dL — ABNORMAL HIGH (ref 60–99)
Lab: 19 mg/dL (ref 6–20)
Potassium: 4.4 mmol/L (ref 3.3–5.1)
Sodium: 140 mmol/L (ref 133–145)
Total Protein: 6.2 g/dL — ABNORMAL LOW (ref 6.3–7.7)
eGFR BY CREAT: 99 *

## 2020-08-18 LAB — CBC AND DIFFERENTIAL
Baso # K/uL: 0.1 10*3/uL (ref 0.0–0.1)
Basophil %: 1.8 %
Eos # K/uL: 0.1 10*3/uL (ref 0.0–0.4)
Eosinophil %: 2.6 %
Hematocrit: 34 % (ref 34–45)
Hemoglobin: 11.4 g/dL (ref 11.2–15.7)
Lymph # K/uL: 1.6 10*3/uL (ref 1.2–3.7)
Lymphocyte %: 37.7 %
MCH: 32 pg (ref 26–32)
MCHC: 34 g/dL (ref 32–36)
MCV: 94 fL (ref 79–95)
Mono # K/uL: 0.1 10*3/uL — ABNORMAL LOW (ref 0.2–0.9)
Monocyte %: 2.6 %
Neut # K/uL: 2.2 10*3/uL (ref 1.6–6.1)
Nucl RBC # K/uL: 0 10*3/uL (ref 0.0–0.0)
Nucl RBC %: 0 /100 WBC (ref 0.0–0.2)
Platelets: 204 10*3/uL (ref 160–370)
RBC: 3.6 MIL/uL — ABNORMAL LOW (ref 3.9–5.2)
RDW: 15.9 % — ABNORMAL HIGH (ref 11.7–14.4)
Seg Neut %: 54.4 %
WBC: 4.1 10*3/uL (ref 4.0–10.0)

## 2020-08-18 LAB — DIFF MANUAL
Diff Based On: 114 CELLS
Myelocyte %: 1 % — ABNORMAL HIGH (ref 0–0)

## 2020-08-18 LAB — MULTIPLE ORDERING DOCS

## 2020-08-18 LAB — NEUTROPHIL #-INSTRUMENT: Neutrophil #-Instrument: 1.7 10*3/uL

## 2020-08-18 LAB — LACTATE DEHYDROGENASE: LD: 196 U/L (ref 118–225)

## 2020-08-19 ENCOUNTER — Ambulatory Visit: Payer: Medicare Other | Attending: Hematology and Oncology

## 2020-08-19 VITALS — BP 134/78 | HR 92 | Temp 97.2°F | Wt 271.4 lb

## 2020-08-19 DIAGNOSIS — Z171 Estrogen receptor negative status [ER-]: Secondary | ICD-10-CM | POA: Insufficient documentation

## 2020-08-19 DIAGNOSIS — C50412 Malignant neoplasm of upper-outer quadrant of left female breast: Secondary | ICD-10-CM | POA: Insufficient documentation

## 2020-08-19 DIAGNOSIS — D471 Chronic myeloproliferative disease: Secondary | ICD-10-CM | POA: Insufficient documentation

## 2020-08-19 DIAGNOSIS — Z5111 Encounter for antineoplastic chemotherapy: Secondary | ICD-10-CM | POA: Insufficient documentation

## 2020-08-19 MED ORDER — PROCHLORPERAZINE MALEATE 10 MG PO TABS *I*
10.0000 mg | ORAL_TABLET | Freq: Once | ORAL | Status: AC
Start: 2020-08-19 — End: 2020-08-19
  Administered 2020-08-19: 10 mg via ORAL

## 2020-08-19 MED ORDER — FAMOTIDINE (PF) 20 MG/2ML IV SOLN *I*
20.0000 mg | Freq: Once | INTRAVENOUS | Status: AC
Start: 2020-08-19 — End: 2020-08-19
  Administered 2020-08-19: 20 mg via INTRAVENOUS

## 2020-08-19 MED ORDER — FAMOTIDINE (PF) 20 MG/2ML IV SOLN *I*
INTRAVENOUS | Status: AC
Start: 2020-08-19 — End: 2020-08-19
  Filled 2020-08-19: qty 2

## 2020-08-19 MED ORDER — DIPHENHYDRAMINE HCL 50 MG/ML IJ SOLN *I*
12.5000 mg | Freq: Once | INTRAMUSCULAR | Status: AC
Start: 2020-08-19 — End: 2020-08-19
  Administered 2020-08-19: 12.5 mg via INTRAVENOUS

## 2020-08-19 MED ORDER — SODIUM CHLORIDE 0.9 % INJ (FLUSH) WRAPPED *I*
10.0000 mL | Status: DC | PRN
Start: 2020-08-19 — End: 2020-08-19
  Administered 2020-08-19: 10 mL

## 2020-08-19 MED ORDER — PALONOSETRON HCL 0.25 MG/5ML IV SOLN *I*
INTRAVENOUS | Status: AC
Start: 2020-08-19 — End: 2020-08-19
  Filled 2020-08-19: qty 5

## 2020-08-19 MED ORDER — SEQUENCING OF DRUGS FOR ADMINISTRATION *I*
1.0000 | Status: DC | PRN
Start: 2020-08-19 — End: 2020-08-19
  Filled 2020-08-19: qty 1

## 2020-08-19 MED ORDER — DEXAMETHASONE SODIUM PHOSPHATE 10 MG/ML IJ SOLN *I*
8.0000 mg | Freq: Once | INTRAMUSCULAR | Status: AC
Start: 2020-08-19 — End: 2020-08-19
  Administered 2020-08-19: 8 mg via INTRAVENOUS

## 2020-08-19 MED ORDER — SODIUM CHLORIDE 0.9 % IV SOLN WRAPPED *I*
30.0000 mL/h | Status: DC | PRN
Start: 2020-08-19 — End: 2020-08-19
  Administered 2020-08-19: 100 mL/h
  Administered 2020-08-19: 30 mL/h via INTRAVENOUS
  Administered 2020-08-19: 600 mL/h
  Administered 2020-08-19 (×2): 30 mL/h
  Administered 2020-08-19: 301 mL/h
  Administered 2020-08-19 (×2): 100 mL/h
  Administered 2020-08-19: 301 mL/h

## 2020-08-19 MED ORDER — HEPARIN LOCK FLUSH 10 UNIT/ML IJ SOLN WRAPPED *I*
50.0000 [IU] | INTRAVENOUS | Status: DC | PRN
Start: 2020-08-19 — End: 2020-08-19
  Administered 2020-08-19: 50 [IU]

## 2020-08-19 MED ORDER — SODIUM CHLORIDE 0.9 % IV SOLN WRAPPED *I*
80.0000 mg/m2 | Freq: Once | INTRAVENOUS | Status: AC
Start: 2020-08-19 — End: 2020-08-19
  Administered 2020-08-19: 187 mg via INTRAVENOUS
  Filled 2020-08-19: qty 31.17

## 2020-08-19 MED ORDER — DIPHENHYDRAMINE HCL 50 MG/ML IJ SOLN *I*
INTRAMUSCULAR | Status: AC
Start: 2020-08-19 — End: 2020-08-19
  Administered 2020-08-19: 0 mg via INTRAVENOUS
  Filled 2020-08-19: qty 1

## 2020-08-19 MED ORDER — DEXTROSE 5 % IV SOLN WRAPPED *I*
225.0000 mg | Freq: Once | INTRAVENOUS | Status: AC
Start: 2020-08-19 — End: 2020-08-19
  Administered 2020-08-19: 225 mg via INTRAVENOUS
  Filled 2020-08-19: qty 22.5

## 2020-08-19 MED ORDER — DEXAMETHASONE SOD PHOSPHATE PF 10 MG/ML IJ SOLN *I*
INTRAMUSCULAR | Status: AC
Start: 2020-08-19 — End: 2020-08-19
  Administered 2020-08-19: 0 mg via INTRAVENOUS
  Filled 2020-08-19: qty 1

## 2020-08-19 MED ORDER — PALONOSETRON HCL 0.25 MG/5ML IV SOLN *I*
0.2500 mg | Freq: Once | INTRAVENOUS | Status: AC
Start: 2020-08-19 — End: 2020-08-19
  Administered 2020-08-19: 0.25 mg via INTRAVENOUS

## 2020-08-19 NOTE — Progress Notes (Signed)
Patient presents for C6 Taxol Carbo. Consent, labs, assessment, BSA reviewed, all are within parameters to treat. Pt notes neuropathy in feet is worsening with each treatment although it's not constant or affecting her ADLs - team was made aware. Pt declines cryotherapy to her feet today. Pt had a port paced at [facility] yesterday, left accessed for treatment - site is covered, clean, dry, and intact. Pt reports no pain at the site. OK to use noted and placed in scanning.    Port with brisk blood return. Pre medications administered per orders followed by a 30 minute wait period. Taxol was then infused over 1 hour followed by Carbo over 30 minutes, pt tolerated well.  Port deaccessed without issues and flushed with Heparin per policy. Occlusive gauze dressing placed over site.  Patient discharged in stable condition, aware to call with any questions or concerns.     Patient complied with masking policy throughout duration of appointment. Writer maintained use of mask throughout duration of appointment. Writer was within 6 feet of patient for > 15 minutes due to the nature of the treatment.

## 2020-08-23 DIAGNOSIS — E119 Type 2 diabetes mellitus without complications: Secondary | ICD-10-CM

## 2020-08-23 HISTORY — DX: Type 2 diabetes mellitus without complications: E11.9

## 2020-08-25 ENCOUNTER — Other Ambulatory Visit
Admission: RE | Admit: 2020-08-25 | Discharge: 2020-08-25 | Disposition: A | Discharge status: Home / Self Care | Payer: Medicare Other | Source: Ambulatory Visit | Attending: Oncology | Admitting: Oncology

## 2020-08-25 DIAGNOSIS — Z171 Estrogen receptor negative status [ER-]: Secondary | ICD-10-CM | POA: Insufficient documentation

## 2020-08-25 DIAGNOSIS — D471 Chronic myeloproliferative disease: Secondary | ICD-10-CM | POA: Insufficient documentation

## 2020-08-25 DIAGNOSIS — C50412 Malignant neoplasm of upper-outer quadrant of left female breast: Secondary | ICD-10-CM | POA: Insufficient documentation

## 2020-08-25 LAB — CBC AND DIFFERENTIAL
Baso # K/uL: 0.1 10*3/uL (ref 0.0–0.1)
Basophil %: 1.4 %
Eos # K/uL: 0.1 10*3/uL (ref 0.0–0.4)
Eosinophil %: 2.3 %
Hematocrit: 33 % — ABNORMAL LOW (ref 34–45)
Hemoglobin: 10.9 g/dL — ABNORMAL LOW (ref 11.2–15.7)
IMM Granulocytes #: 0 10*3/uL (ref 0.0–0.0)
IMM Granulocytes: 0.5 %
Lymph # K/uL: 2.1 10*3/uL (ref 1.2–3.7)
Lymphocyte %: 49.8 %
MCH: 31 pg (ref 26–32)
MCHC: 33 g/dL (ref 32–36)
MCV: 96 fL — ABNORMAL HIGH (ref 79–95)
Mono # K/uL: 0.3 10*3/uL (ref 0.2–0.9)
Monocyte %: 7.2 %
Neut # K/uL: 1.7 10*3/uL (ref 1.6–6.1)
Nucl RBC # K/uL: 0 10*3/uL (ref 0.0–0.0)
Nucl RBC %: 0 /100 WBC (ref 0.0–0.2)
Platelets: 202 10*3/uL (ref 160–370)
RBC: 3.5 MIL/uL — ABNORMAL LOW (ref 3.9–5.2)
RDW: 17.1 % — ABNORMAL HIGH (ref 11.7–14.4)
Seg Neut %: 38.8 %
WBC: 4.3 10*3/uL (ref 4.0–10.0)

## 2020-08-25 LAB — LACTATE DEHYDROGENASE: LD: 235 U/L — ABNORMAL HIGH (ref 118–225)

## 2020-08-25 LAB — COMPREHENSIVE METABOLIC PANEL
ALT: 68 U/L — ABNORMAL HIGH (ref 0–35)
AST: 41 U/L — ABNORMAL HIGH (ref 0–35)
Albumin: 4.3 g/dL (ref 3.5–5.2)
Alk Phos: 96 U/L (ref 35–105)
Anion Gap: 14 (ref 7–16)
Bilirubin,Total: 0.5 mg/dL (ref 0.0–1.2)
CO2: 23 mmol/L (ref 20–28)
Calcium: 9.6 mg/dL (ref 8.6–10.2)
Chloride: 100 mmol/L (ref 96–108)
Creatinine: 0.76 mg/dL (ref 0.51–0.95)
Glucose: 105 mg/dL — ABNORMAL HIGH (ref 60–99)
Lab: 19 mg/dL (ref 6–20)
Potassium: 5.1 mmol/L (ref 3.3–5.1)
Sodium: 137 mmol/L (ref 133–145)
Total Protein: 7 g/dL (ref 6.3–7.7)
eGFR BY CREAT: 90 *

## 2020-08-25 LAB — MULTIPLE ORDERING DOCS

## 2020-08-25 LAB — NEUTROPHIL #-INSTRUMENT: Neutrophil #-Instrument: 1.7 10*3/uL

## 2020-08-26 ENCOUNTER — Ambulatory Visit: Payer: Medicare Other | Attending: Hematology and Oncology | Admitting: Hematology and Oncology

## 2020-08-26 ENCOUNTER — Other Ambulatory Visit: Payer: Self-pay | Admitting: Hematology and Oncology

## 2020-08-26 ENCOUNTER — Ambulatory Visit: Payer: Medicare Other

## 2020-08-26 VITALS — BP 116/75 | HR 92 | Temp 97.3°F | Resp 20 | Wt 266.1 lb

## 2020-08-26 DIAGNOSIS — D471 Chronic myeloproliferative disease: Secondary | ICD-10-CM | POA: Insufficient documentation

## 2020-08-26 DIAGNOSIS — Z5111 Encounter for antineoplastic chemotherapy: Secondary | ICD-10-CM | POA: Insufficient documentation

## 2020-08-26 DIAGNOSIS — Z171 Estrogen receptor negative status [ER-]: Secondary | ICD-10-CM

## 2020-08-26 DIAGNOSIS — C50412 Malignant neoplasm of upper-outer quadrant of left female breast: Secondary | ICD-10-CM

## 2020-08-26 MED ORDER — SODIUM CHLORIDE 0.9 % INJ (FLUSH) WRAPPED *I*
10.0000 mL | Status: DC | PRN
Start: 2020-08-26 — End: 2020-08-26
  Administered 2020-08-26: 10 mL

## 2020-08-26 MED ORDER — PALONOSETRON HCL 0.25 MG/5ML IV SOLN *I*
INTRAVENOUS | Status: AC
Start: 2020-08-26 — End: 2020-08-26
  Filled 2020-08-26: qty 5

## 2020-08-26 MED ORDER — PALONOSETRON HCL 0.25 MG/5ML IV SOLN *I*
0.2500 mg | Freq: Once | INTRAVENOUS | Status: AC
Start: 2020-08-26 — End: 2020-08-26
  Administered 2020-08-26: 0.25 mg via INTRAVENOUS

## 2020-08-26 MED ORDER — DEXAMETHASONE SODIUM PHOSPHATE 10 MG/ML IJ SOLN *I*
8.0000 mg | Freq: Once | INTRAMUSCULAR | Status: AC
Start: 2020-08-26 — End: 2020-08-26
  Administered 2020-08-26: 8 mg via INTRAVENOUS

## 2020-08-26 MED ORDER — FAMOTIDINE (PF) 20 MG/2ML IV SOLN *I*
20.0000 mg | Freq: Once | INTRAVENOUS | Status: AC
Start: 2020-08-26 — End: 2020-08-26
  Administered 2020-08-26: 20 mg via INTRAVENOUS

## 2020-08-26 MED ORDER — DIPHENHYDRAMINE HCL 50 MG/ML IJ SOLN *I*
12.5000 mg | Freq: Once | INTRAMUSCULAR | Status: AC
Start: 2020-08-26 — End: 2020-08-26
  Administered 2020-08-26: 12.5 mg via INTRAVENOUS

## 2020-08-26 MED ORDER — FAMOTIDINE (PF) 20 MG/2ML IV SOLN *I*
INTRAVENOUS | Status: AC
Start: 2020-08-26 — End: 2020-08-26
  Filled 2020-08-26: qty 2

## 2020-08-26 MED ORDER — LIDOCAINE-PRILOCAINE 2.5-2.5 % EX CREA *I*
TOPICAL_CREAM | Freq: Once | CUTANEOUS | 2 refills | Status: AC | PRN
Start: 2020-08-26 — End: 2020-08-26

## 2020-08-26 MED ORDER — DIPHENHYDRAMINE HCL 50 MG/ML IJ SOLN *I*
INTRAMUSCULAR | Status: AC
Start: 2020-08-26 — End: 2020-08-26
  Filled 2020-08-26: qty 1

## 2020-08-26 MED ORDER — SODIUM CHLORIDE 0.9 % IV SOLN WRAPPED *I*
80.0000 mg/m2 | Freq: Once | INTRAVENOUS | Status: AC
Start: 2020-08-26 — End: 2020-08-26
  Administered 2020-08-26: 187 mg via INTRAVENOUS
  Filled 2020-08-26: qty 31.17

## 2020-08-26 MED ORDER — DEXTROSE 5 % IV SOLN WRAPPED *I*
225.0000 mg | Freq: Once | INTRAVENOUS | Status: AC
Start: 2020-08-26 — End: 2020-08-26
  Administered 2020-08-26: 225 mg via INTRAVENOUS
  Filled 2020-08-26: qty 22.5

## 2020-08-26 MED ORDER — PROCHLORPERAZINE MALEATE 10 MG PO TABS *I*
10.0000 mg | ORAL_TABLET | Freq: Once | ORAL | Status: AC
Start: 2020-08-26 — End: 2020-08-26
  Administered 2020-08-26: 10 mg via ORAL

## 2020-08-26 MED ORDER — SODIUM CHLORIDE 0.9 % IV SOLN WRAPPED *I*
30.0000 mL/h | Status: DC | PRN
Start: 2020-08-26 — End: 2020-08-26
  Administered 2020-08-26: 30 mL/h via INTRAVENOUS
  Administered 2020-08-26: 100 mL/h
  Administered 2020-08-26 (×2): 30 mL/h
  Administered 2020-08-26: 301 mL/h
  Administered 2020-08-26: 600 mL/h
  Administered 2020-08-26: 301 mL/h
  Administered 2020-08-26: 100 mL/h
  Administered 2020-08-26: 20 mL/h

## 2020-08-26 MED ORDER — SEQUENCING OF DRUGS FOR ADMINISTRATION *I*
1.0000 | Status: DC | PRN
Start: 2020-08-26 — End: 2020-08-26
  Filled 2020-08-26: qty 1

## 2020-08-26 MED ORDER — HEPARIN LOCK FLUSH 10 UNIT/ML IJ SOLN WRAPPED *I*
50.0000 [IU] | INTRAVENOUS | Status: DC | PRN
Start: 2020-08-26 — End: 2020-08-26
  Administered 2020-08-26: 50 [IU]

## 2020-08-26 MED ORDER — DEXAMETHASONE SOD PHOSPHATE PF 10 MG/ML IJ SOLN *I*
INTRAMUSCULAR | Status: AC
Start: 2020-08-26 — End: 2020-08-26
  Filled 2020-08-26: qty 1

## 2020-08-26 NOTE — Progress Notes (Signed)
Patient presents from clinic for weekly Taxol Carbo. Consent, handoff, labs, assessment, BSA reviewed all are within parameter to treat. Port accessed without difficulty. Flushes easily and blood return noted. Occlusive dressing placed, clean dry and intact. Pre medications administered per orders followed by a 30 minute wait period. Taxol administered over 1 hour followed by Carbo over 30 minutes. Pt tolerated well.  Port deaccessed without issues and flushed with Heparin per policy. Gauze dressing placed over site.  Patient discharged in stable condition, aware to call with any questions or concerns.     Patient complied with masking policy throughout duration of appointment. Writer maintained use of mask throughout duration of appointment. Writer was within 6 feet of patient for > 15 minutes due to the nature of the treatment.

## 2020-08-26 NOTE — Progress Notes (Signed)
Carly Higgins     FOLLOW-UP VISIT    PATIENT NAME:  Carly, Higgins    DOB:  [birth year]   MRN:  [id number]  08/26/2020     DIAGNOSIS:  Clinical stage IIB left breast cancer    REASON FOR VISIT: Carly Higgins is seen today in anticipation of starting cycle 3 of weekly paclitaxel and carboplatin as neoadjuvant chemotherapy for clinical stage IIB (cT2, cN0, cM0) grade 3, ER negative, PR negative, HER2 negative infiltrating ductal carcinoma of the left breast.    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  In February 2022 she began to experience discomfort in the left breast.  She first attributed this to having been struck with a part of her dog when she picked it up about 1 week earlier.  She continued to experience discomfort intermittently.  She was in [place] at the time and did not seek any evaluation.      When she returned to [place] she scheduled a screening mammogram, having not had an exam since July 2018.  This was performed on May 18, 2020 and revealed an area of architectural distortion in the middle third of the left breast upper outer quadrant at the 2 o'clock position.    On Jun 07, 2020 diagnostic mammogram and breast ultrasound was performed.  The mammogram revealed scattered areas of fibroglandular densities with an area of architectural distortion again noted at the 2 o'clock position of the left breast.  Breast ultrasound revealed an irregular mass measuring 19 x 19 x 12 mm in the middle third upper outer quadrant of the left breast.  Internal echotexture was hypoechoic and there was internal vascularity.      Based on these findings, ultrasound-guided core biopsy of the left breast 2:00 lesion was performed on June 09, 2020.  Biopsy revealed invasive ductal carcinoma.  Provisional combined Nottingham grade was 3 of 3 (tubule formation score 3, nuclear grade 3, mitotic  rate score 2).  Tumor cells were negative for estrogen receptors and progesterone receptors.  Tumor cells were negative for HER2 by immunohistochemical staining with a staining score of 1+.  Ki-67 staining was seen in approximately 70% of tumor cells.    She was seen by Dr. Freda Munro for surgical consultation at which time she was noted to have a masslike area at the 2 o'clock position of the left breast which felt like a hematoma.  Staging scans were requested.    She was seen in the [facility]red[facility] Oncology clinic on June 13, 2020. Because of her prior allogeneic bone marrow transplant, she was not eligible for standard germline testing with blood or saliva specimen.      Whole-body bone scan performed on June 20, 2020 revealed no abnormal focus of radiotracer uptake to suggest osseous metastatic disease.    CT of the chest performed with contrast on June 22, 2020 revealed an ill-defined enhancing lesion in the lateral left breast measuring 1.7 x 1.2 x 1.1 cm.  There were asymmetrically prominent level 1 left axillary lymph nodes that demonstrated enhancement of uncertain significance.  These were felt possibly to represent metastatic disease.  There were no enlarged mediastinal, hilar, intramammary or supraclavicular lymph nodes.  No suspicious pulmonary nodules were identified.  CT of the abdomen pelvis performed at the same time revealed no evidence of metastatic disease within the abdomen and pelvis.  A small  fat filled umbilical hernia was noted.    MRI of the breast performed on June 28, 2020 revealed an irregular mass with spiculated margins measuring 10 x 25 x 19 mm in the left breast upper outer quadrant at the 2 o'clock position located 3 cm from the nipple.    Neoadjuvant chemotherapy was recommended and she began cycle 1 of weekly paclitaxel and carboplatin on July 15, 2020.  Immunotherapy with pembrolizumab was not recommended due to her previous allogenic bone marrow transplant.    INTERVAL  HISTORY: On August 18, 2020 she underwent placement of PowerPort in the right internal jugular vein.    She reports that she was recently started on tirzepatide for treatment of type 2 diabetes mellitus.    She reports that she has continued to tolerate the treatment well.  She is no longer experiencing pain in the breast and feels that the mass has decreased in size.  Appetite has been good.  She denies fever or symptoms of infection.  She reports feeling fatigue a few days after each treatment.  She denies mouth sores.  She has noted some mild exertional dyspnea which is unchanged from her recent baseline.  She denies cough or other respiratory symptoms.  She has no cardiac symptoms including exertional chest pain palpitations.  She has noted nausea on occasion for which she has taken ondansetron and prochlorperazine.  She has not vomited.  She reports occasional constipation.  She has no other gastrointestinal symptoms including abdominal pain and bloating.  She has no urinary symptoms.  She continues to have left hip pain on occasion.  She has no other musculoskeletal symptoms including back pain or bone pain.  She denies swelling of her extremities.  She denies headaches.  She reports no cognitive changes.  She has no focal motor symptoms.  She has started to note some mild numbness in the first toe of each foot.  She denies numbness in her hands.  Gait remains unsteady due to her hip pain.  She has no dermatologic symptoms.    She reports that she has been smoking several cigarettes a day.    PAST MEDICAL/SURGICAL HISTORY:   1. 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  Prior to bone marrow transplant she received 5 cycles of decitabine.  She received fludarabine, busulfan and and hydroxyurea for the transplant and received methotrexate for graft-versus-host  prophylaxis.  2. Hypothyroid.  3. Anxiety.  4. Osteoarthritis of the hips, left greater than right.  5. Mild diverticulosis of the sigmoid colon.  6. History of atrial fibrillation and congestive heart failure in 2017 related to the bone marrow transplant.  7. Right cataract surgery.  8. Right rotator cuff surgery.    MEDICATIONS:     Current Outpatient Medications   Medication Sig    Tirzepatide [facility]) Inject into the skin    acyclovir (ZOVIRAX) 400 mg tablet Take 400 mg by mouth 2 times daily    ondansetron (ZOFRAN) 8 mg tablet Take 1 tablet (8 mg total) by mouth every 8 hours as needed  Begin 72 hours after treatment if needed for nausea    prochlorperazine (COMPAZINE) 10 mg tablet Take 1 tablet (10 mg total) by mouth every 6 hours as needed (nausea)    metoprolol tartrate (LOPRESSOR) 50 mg tablet Take 1 tablet (50 mg total) by mouth 2 times daily    ondansetron (ZOFRAN) 4 mg tablet  Take 1 tablet (4 mg total) by mouth 3 times daily as needed (nausea)    cholecalciferol (VITAMIN D) 50 MCG (2000 UT) tablet Take 1,000 Units by mouth daily    levothyroxine (SYNTHROID, LEVOTHROID) 50 MCG tablet Take 50 mcg by mouth daily (before breakfast)    escitalopram (LEXAPRO) 20 MG tablet Take 20 mg by mouth daily    aspirin 81 MG EC tablet Take 81 mg by mouth daily       ROS: As outlined above and in the nursing assessment. Comprehensive review of systems is otherwise negative.        Vitals:    08/26/20 0830   BP: 116/75   Pulse: 92   Resp: 20   Temp: 36.3 C (97.3 F)   SpO2: 95% on room air    Weight: 120.7 kg (266 lb 1.5 oz)       PHYSICAL EXAMINATION: General: Reveals a well-appearing woman. ECOG performance status is 1.  Weight is down 2.6 kg from last visit. HEENT exam: grossly unremarkable.  Sclerae are anicteric. Conjunctivae are pink.  Oral mucosa is without stomatitis or thrush. Lymph nodes: There is no palpable cervical, supraclavicular, axillary or inguinal lymph node enlargement. Lungs: clear to  auscultation and percussion.  Cardiac exam:  reveals a regular rate and rhythm. No murmur is noted.  Chest: PowerPort is present in the upper chest on the right.  Wounds are healing.  Breasts: Left breast has a palpable mass in the upper outer quadrant measuring approximately 2.5 cm.  This appears to be smaller than on prior exams.  Overlying skin is unremarkable.  Abdomen: is soft.  Bowel sounds are normoactive.  There is no hepatosplenomegaly, mass or ascites detected by palpation and percussion..  Extremities: are without edema.  Neurologic exam: is grossly nonfocal. Skin: is without rash, petechiae or ecchymoses.  Again noted are patches of vitiligo in the upper abdomen.    LABORATORY DATA:     CBC and differential    Collection Time: 08/25/20 10:36 AM   Result Value Ref Range    WBC 4.3 4.0 - 10.0 THOU/uL    RBC 3.5 (L) 3.9 - 5.2 MIL/uL    Hemoglobin 10.9 (L) 11.2 - 15.7 g/dL    Hematocrit 33 (L) 34 - 45 %    MCV 96 (H) 79 - 95 fL    MCH 31 26 - 32 pg    MCHC 33 32 - 36 g/dL    RDW 17.1 (H) 11.7 - 14.4 %    Platelets 202 160 - 370 THOU/uL    Seg Neut % 38.8 %    Lymphocyte % 49.8 %    Monocyte % 7.2 %    Eosinophil % 2.3 %    Basophil % 1.4 %    Neut # K/uL 1.7 1.6 - 6.1 THOU/uL    Lymph # K/uL 2.1 1.2 - 3.7 THOU/uL    Mono # K/uL 0.3 0.2 - 0.9 THOU/uL    Eos # K/uL 0.1 0.0 - 0.4 THOU/uL    Baso # K/uL 0.1 0.0 - 0.1 THOU/uL    Nucl RBC % 0.0 0.0 - 0.2 /100 WBC    Nucl RBC # K/uL 0.0 0.0 - 0.0 THOU/uL    IMM Granulocytes # 0.0 0.0 - 0.0 THOU/uL    IMM Granulocytes 0.5 %      Comprehensive metabolic panel    Collection Time: 08/25/20 10:36 AM   Result Value Ref Range    Sodium 137 133 -  145 mmol/L    Potassium 5.1 3.3 - 5.1 mmol/L    Chloride 100 96 - 108 mmol/L    CO2 23 20 - 28 mmol/L    Anion Gap 14 7 - 16    UN 19 6 - 20 mg/dL    Creatinine 0.76 0.51 - 0.95 mg/dL    eGFR BY CREAT 90 *    Glucose 105 (H) 60 - 99 mg/dL    Calcium 9.6 8.6 - 10.2 mg/dL    Total Protein 7.0 6.3 - 7.7 g/dL    Albumin 4.3 3.5 - 5.2  g/dL    Bilirubin,Total 0.5 0.0 - 1.2 mg/dL    AST 41 (H) 0 - 35 U/L    ALT 68 (H) 0 - 35 U/L    Alk Phos 96 35 - 105 U/L      Lactate dehydrogenase    Collection Time: 08/25/20 10:36 AM   Result Value Ref Range    LD 235 (H) 118 - 225 U/L       IMAGING DATA: No new imaging data    ASSESSMENT: Ms. Newby has continued to tolerate chemotherapy well.  She has noted some mild numbness in the first toe each foot on occasion.  This is not a contraindication to continuing with treatment but we will need to monitor closely for further progression of symptoms.  Her breast tumor appears to be responding to the chemotherapy.  I have recommended that she continue with treatment.  She is agreeable and will start cycle 3 today.  She will return for follow-up in 3 weeks anticipation of starting cycle 4 of planned 4 cycles of weekly paclitaxel and carboplatin.    She has been smoking cigarettes.  I have recommended that she try if possible to stop smoking.    PLAN:     Clinical stage IIB triple negative left breast cancer     Start cycle 3 of weekly paclitaxel and carboplatin today.  Treatment will consist of paclitaxel 80 mg/m and carboplatin AUC of 1.5 administered on days 1, 8 and 15 of each 21-day cycle.     Follow-up in 3 weeks anticipation of starting cycle 4 of planned 4 cycles of paclitaxel and carboplatin.     After completion of 4 cycles of paclitaxel and carboplatin, patient will receive 4 cycles of doxorubicin 60 mg/m and cyclophosphamide 600 mg/m on day 1 of each 21-day cycle.     Hold on immunotherapy with pembrolizumab in light of her previous allogenic bone marrow transplant.     Surgical resection after completion of neoadjuvant chemotherapy.    COVID-19     Patient has received the [place] COVID-19 vaccine, receiving the first dose on May 15, 2019, second dose on Jun 05, 2019 and booster dose on October 08, 2019.     She is eligible for a second booster dose.    I have recommend that she  consider getting this in the next few months, preferably with the newer version of the vaccine when it becomes available.     Patient advised to continue to take precautions to reduce risk of contracting COVID-19, including social distancing, frequent handwashing and using a mask when in public.    Health maintenance     Patient encouraged to stop smoking cigarettes entirely.     Recommend patient receive flu vaccine in the next few months.    Follow-up     Return for follow-up in 3 weeks.    Due to concerns regarding  COVID-19, a facemask was worn by myself as well as the patient throughout the office visit, except lately to allow for inspection of the oral cavity.      [name], MD    AY:[id number] Fredda Hammed, MD  Minus Liberty, MD  Assunta Curtis, MD

## 2020-08-26 NOTE — Progress Notes (Signed)
ECOG Performance     ROS:  Appetite normal yes  Weight change yes-down 123.3kg  Fever   no  Night sweats  no  Fatigue  yes- a few days after treatment  Bleeding   no  Cough    no  Dyspnea  yes- on exertion   Chest pain  no  Palpitations  no  Nausea  yes- taking zofran and compazine  Vomiting  no  Dysphagia  no  Mucositis  no  Diarrhea  no  Constipation  yes- occasionaly  Abdominal pain no  Urinary symptoms no  Back pain  no  Joint pain  yes-left hip  Bone pain  no  Leg/arm swelling no  Headache  no  Mental status change no  Motor weakness no  Sensory change yes-neuropathy starting in big toes  Gait disturbance yes-unsteady gait from hip pain  Rash    no    LMP:  MMG:  GYN:  DEXA:  COLONOSCOPY:

## 2020-08-26 NOTE — Progress Notes (Addendum)
[place] TREATMENT HAND-OFF TOOL:   SITUATION:   Scheduled treatment category for today:     Cancer treatment:  Chemotherapy    Is this a new cancer treatment?: No      Patient seen by MD    Consent obtained:  Yes    Location:  [facility]    Labs complete:  Yes    Within parameters: Yes      Current patient status:  Scheduled treatment    OK to treat for scheduled treatment:  Yes    Patient is okay for paclitaxel and carboplatin treatment today and the next 2 weeks provided treatment parameters are met (08/26/2020).  [name], MD

## 2020-08-26 NOTE — Patient Instructions (Addendum)
August 2022      'Sunday Monday Tuesday Wednesday Thursday Friday Saturday        1     2     3     4    Outpatient Visit   8:19 AM   [facility] 5    CHEMOTHERAPY   8:30 AM   (150 min.)   McMahon, Rachel Marie, RN   [facility] 6       7     8     9    ACUPUNCTURE   9:30 AM   (60 min.)   Fang, Jin, LAC   [facility] 10     11    Outpatient Visit   8:36 AM   [facility] 12    CHEMOTHERAPY   8:30 AM   (150 min.)   McMahon, Rachel Marie, RN   [facility] 13       14     15     16     17     18    Outpatient Visit  10:29 AM    Outpatient Lab 19    FOLLOW UP VISIT   8:30 AM   (30 min.)   Yirinec, Brian D, MD   [facility]    CHEMOTHERAPY   9:30 AM   (150 min.)   CAN CTR, PLUTA POD B   [facility] 20       21     22     23    ACUPUNCTURE   9:30 AM   (60 min.)   Fang, Jin, LAC   [facility] 24     25     26    CHEMOTHERAPY   8:30 AM   (150 min.)   CAN CTR, PLUTA POD A   [facility] 27       28     29     30    ACUPUNCTURE   9:30 AM   (60 min.)   Fang, Jin, LAC   [facility] 31                                September 2022      Sunday Monday Tuesday Wednesday Thursday Friday Saturday                       1     2    CHEMOTHERAPY   8:30 AM   (150 min.)   CAN CTR, PLUTA POD E   [facility] 3       4     5     6    ACUPUNCTURE   9:30 AM   (60 min.)   Fang, Jin, LAC   [facility] 7    Labs 8    FOLLOW UP VISIT   1:30 PM   (30 min.)   Miller, Michelle M, NP   [facility]    CHEMOTHERAPY   2:30 PM   (150 min.)   CAN CTR, PLUTA POD D   [facility] 9     10       11     12'$   $'13     14     15   'E$ Labs   16  Taxol/Carbo  8:'30  17       18     19     20     21     22   '$ Labs   23  Taxol/Carbo 8:'30    24        25     26     27     28  '$ LABS   29  FOLLOW UP VISIT   10:00 AM   (30 min.)   Beverely Pace, MD   [facility]    [facility] 11      30

## 2020-08-30 ENCOUNTER — Ambulatory Visit: Payer: Medicare Other | Admitting: Acupuncturist

## 2020-08-30 NOTE — Progress Notes (Signed)
Acupuncture Treatment Form For Cancer Patient    Date: 08/30/2020  Patient Name: Carly Higgins, DOB: [birth year]    Diagnosis: Malignant neoplasm of upper-outer quadrant of left breast in female    Subjective/Chief Complaint: Patient reports had no chief complaint listed for this encounter.    TCM Subjective:  Expression: Calm  Complexion: Pale  Voice: Normal  Inquiring: Thirsty and Cold Drinks  Appetite: Normal  Life Style: Water  Bowels: Normal  Urine: Normal Color  General Body Aching: Neuropathy  Pain Characters: Throbbing  Pain Level: 5  Numbness Level: 5  Mental: Sleep Good  Menstrual: None  Dizziness: No  Ringing in the Ear:  No     TCM Objective: Patient appears calm  Tongue Body: Pale  Coating: Thin  Pulse Characters: Left Side: Slow and Weak Right Side: Slow and Weak     Patient past history, current pertinent medical history, lab results, acupuncture procedure, benefits limitation were discussed with patient as needed. They have no further questions at this time.    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    Position Supine:  '[]'$  Scalp '[]'$  Forehead '[]'$  Face '[x]'$  Neck '[]'$  Shoulders '[]'$  Arms '[]'$  Hands '[]'$  Upper legs '[]'$  Lower legs '[]'$  Feet  Position Prone:  '[]'$  Left side-lying '[]'$  Right side-lying '[]'$  Back '[]'$  Neck '[]'$  Head '[]'$  Upper legs '[]'$  Lower legs '[]'$  Feet  Position Support:  '[]'$  Upper body incline '[x]'$  Head support '[x]'$  Knee support '[]'$  Breast bolster '[]'$  Shoulder support    Cancer Treatment Points:  1. Improve White Blood Cells and Boost Energy:  ??R 14      '[]'$    ??BL 17      '[]'$   ??R 6        '[]'$   ??R 4          '[]'$   ??R 14      '[]'$   ??UB 11 '[]'$   ??UB 20   '[]'$   ??UG 18    '[]'$   ??UB 21 '[]'$   ??GB 39   '[]'$    ??SP 10    '[x]'$   ??P 6        '[]'$    ??R 4         '[x]'$   ??R 6      '[]'$   ??BL 17    '[]'$    ??UB 20      '[]'$   ??K 3        '[x]'$    ???ST 36  '[x]'$    ??LR 3       '[]'$    ???SP 6    '[x]'$  Head 5 needles    2. Warm Ronny Flurry and Tonic Qi (pale face and fatigue):  ??R 4  '[]'$  ??R 14 '[]'$   ??DU 4 '[]'$  ??UB 43 '[]'$   ??R 6  '[]'$  ???ST 36 '[]'$   ???UB 24 '[]'$  UB 52  '[]'$   ??DU 20 '[x]'$  ??R 14 '[]'$     3. Spleen and Kidney Tonic (nausea or vomiting)  ???ST 36 '[x]'$  ???SP 6 '[x]'$    ??LI 11 '[x]'$  ??PR 11  '[]'$   ????/?????  ??SP 10 '[]'$  ??R 6  '[]'$   ??R 6  '[]'$  ??R 4  '[]'$   ??P 6  '[]'$   ??UB 17 '[]'$   ??P 6  '[]'$  ??K 6  '[]'$   ??  P 7  '[]'$  ??SJ 5 '[]'$   ??SP 18 '[]'$  ??GB 43 '[]'$   ??LI 20 '[]'$  ?? LI 11 '[]'$   ??BU 15 '[]'$  ??UB 20 '[]'$   ??PR 6 '[]'$   ????????  ??HT 8 '[]'$  ???ST 36 '[]'$     4.Tonic Qi and Ying (posible phase IV)  ???SP 9 '[x]'$  ???GB 34  '[x]'$   ??  ???Sp 9 '[]'$   ??K 1  '[]'$   ??R 14 '[]'$  ??BL 17 '[]'$     Total Needles 32  Infrared Heat Lamp Applied Yes '[x]'$  No '[]'$   Patient response(s) to acupuncture treatment: patient reports positive benefits.     Plan / Recommendations:  Continue comfort acupuncture addressing specific symptoms as indicated.  Patient received 2 complementary acupuncture sessions.   Patient is eligible for 2 additional sessions .    Musical Therapy: Ayesha Mohair Rachmaninoff and Jamas Lav     Patient complied with masking policy throughout duration of appointment. Corinna Gab maintained use of a PPE throughout duration of appointment. Corinna Gab was within 6 feet of patient for less than 5 minutes due to the nature of the treatment.

## 2020-09-01 ENCOUNTER — Other Ambulatory Visit
Admission: RE | Admit: 2020-09-01 | Discharge: 2020-09-01 | Disposition: A | Discharge status: Home / Self Care | Payer: Medicare Other | Source: Ambulatory Visit | Attending: Hematology and Oncology | Admitting: Hematology and Oncology

## 2020-09-01 ENCOUNTER — Other Ambulatory Visit: Payer: Self-pay | Admitting: Hematology and Oncology

## 2020-09-01 DIAGNOSIS — Z171 Estrogen receptor negative status [ER-]: Secondary | ICD-10-CM | POA: Insufficient documentation

## 2020-09-01 DIAGNOSIS — C50412 Malignant neoplasm of upper-outer quadrant of left female breast: Secondary | ICD-10-CM | POA: Insufficient documentation

## 2020-09-01 DIAGNOSIS — D471 Chronic myeloproliferative disease: Secondary | ICD-10-CM | POA: Insufficient documentation

## 2020-09-01 LAB — CBC AND DIFFERENTIAL
Baso # K/uL: 0 10*3/uL (ref 0.0–0.1)
Basophil %: 0 %
Eos # K/uL: 0 10*3/uL (ref 0.0–0.4)
Eosinophil %: 0 %
Hematocrit: 32 % — ABNORMAL LOW (ref 34–45)
Hemoglobin: 10.6 g/dL — ABNORMAL LOW (ref 11.2–15.7)
Lymph # K/uL: 1.5 10*3/uL (ref 1.2–3.7)
Lymphocyte %: 40.9 %
MCH: 32 pg (ref 26–32)
MCHC: 33 g/dL (ref 32–36)
MCV: 96 fL — ABNORMAL HIGH (ref 79–95)
Mono # K/uL: 0.1 10*3/uL — ABNORMAL LOW (ref 0.2–0.9)
Monocyte %: 3.5 %
Neut # K/uL: 2 10*3/uL (ref 1.6–6.1)
Nucl RBC # K/uL: 0 10*3/uL (ref 0.0–0.0)
Nucl RBC %: 0 /100 WBC (ref 0.0–0.2)
Platelets: 179 10*3/uL (ref 160–370)
RBC: 3.3 MIL/uL — ABNORMAL LOW (ref 3.9–5.2)
RDW: 17.4 % — ABNORMAL HIGH (ref 11.7–14.4)
Seg Neut %: 54.7 %
WBC: 3.6 10*3/uL — ABNORMAL LOW (ref 4.0–10.0)

## 2020-09-01 LAB — COMPREHENSIVE METABOLIC PANEL
ALT: 51 U/L — ABNORMAL HIGH (ref 0–35)
AST: 36 U/L — ABNORMAL HIGH (ref 0–35)
Albumin: 4.1 g/dL (ref 3.5–5.2)
Alk Phos: 91 U/L (ref 35–105)
Anion Gap: 12 (ref 7–16)
Bilirubin,Total: 0.6 mg/dL (ref 0.0–1.2)
CO2: 24 mmol/L (ref 20–28)
Calcium: 9.4 mg/dL (ref 8.6–10.2)
Chloride: 101 mmol/L (ref 96–108)
Creatinine: 0.78 mg/dL (ref 0.51–0.95)
Glucose: 134 mg/dL — ABNORMAL HIGH (ref 60–99)
Lab: 12 mg/dL (ref 6–20)
Potassium: 4.9 mmol/L (ref 3.3–5.1)
Sodium: 137 mmol/L (ref 133–145)
Total Protein: 6.8 g/dL (ref 6.3–7.7)
eGFR BY CREAT: 87 *

## 2020-09-01 LAB — DIFF MANUAL
Diff Based On: 115 CELLS
React Lymph %: 1 % (ref 0–6)

## 2020-09-01 LAB — MULTIPLE ORDERING DOCS

## 2020-09-01 LAB — LACTATE DEHYDROGENASE: LD: 203 U/L (ref 118–225)

## 2020-09-01 LAB — NEUTROPHIL #-INSTRUMENT: Neutrophil #-Instrument: 1.5 10*3/uL

## 2020-09-02 ENCOUNTER — Ambulatory Visit: Payer: Medicare Other | Admitting: Acupuncturist

## 2020-09-02 ENCOUNTER — Ambulatory Visit: Payer: Medicare Other | Attending: Hematology and Oncology

## 2020-09-02 ENCOUNTER — Telehealth: Payer: Self-pay | Admitting: Hematology and Oncology

## 2020-09-02 VITALS — BP 136/64 | HR 96 | Temp 97.7°F | Wt 265.4 lb

## 2020-09-02 DIAGNOSIS — Z5111 Encounter for antineoplastic chemotherapy: Secondary | ICD-10-CM | POA: Insufficient documentation

## 2020-09-02 DIAGNOSIS — Z171 Estrogen receptor negative status [ER-]: Secondary | ICD-10-CM | POA: Insufficient documentation

## 2020-09-02 DIAGNOSIS — C50412 Malignant neoplasm of upper-outer quadrant of left female breast: Secondary | ICD-10-CM | POA: Insufficient documentation

## 2020-09-02 DIAGNOSIS — D471 Chronic myeloproliferative disease: Secondary | ICD-10-CM | POA: Insufficient documentation

## 2020-09-02 DIAGNOSIS — L24A9 Irritant contact dermatitis due friction or contact with other specified body fluids: Secondary | ICD-10-CM

## 2020-09-02 LAB — AEROBIC CULTURE

## 2020-09-02 MED ORDER — DIPHENHYDRAMINE HCL 50 MG/ML IJ SOLN *I*
INTRAMUSCULAR | Status: AC
Start: 2020-09-02 — End: 2020-09-02
  Administered 2020-09-02: 12.5 mg via INTRAVENOUS
  Filled 2020-09-02: qty 1

## 2020-09-02 MED ORDER — PALONOSETRON HCL 0.25 MG/5ML IV SOLN *I*
0.2500 mg | Freq: Once | INTRAVENOUS | Status: AC
Start: 2020-09-02 — End: 2020-09-02

## 2020-09-02 MED ORDER — CEPHALEXIN 250 MG PO CAPS *I*: 500.0000 mg | ORAL_CAPSULE | Freq: Four times a day (QID) | ORAL | 0 refills | Status: AC

## 2020-09-02 MED ORDER — FAMOTIDINE (PF) 20 MG/2ML IV SOLN *I*
INTRAVENOUS | Status: AC
Start: 2020-09-02 — End: 2020-09-02
  Administered 2020-09-02: 20 mg via INTRAVENOUS
  Filled 2020-09-02: qty 2

## 2020-09-02 MED ORDER — PALONOSETRON HCL 0.25 MG/5ML IV SOLN *I*
INTRAVENOUS | Status: DC
Start: 2020-09-02 — End: 2020-09-02
  Filled 2020-09-02: qty 5

## 2020-09-02 MED ORDER — SODIUM CHLORIDE 0.9 % IV SOLN WRAPPED *I*
80.0000 mg/m2 | Freq: Once | INTRAVENOUS | Status: AC
Start: 2020-09-02 — End: 2020-09-02
  Administered 2020-09-02: 187 mg via INTRAVENOUS
  Filled 2020-09-02: qty 31.17

## 2020-09-02 MED ORDER — FAMOTIDINE (PF) 20 MG/2ML IV SOLN *I*
INTRAVENOUS | Status: DC
Start: 2020-09-02 — End: 2020-09-02
  Filled 2020-09-02: qty 2

## 2020-09-02 MED ORDER — DEXAMETHASONE SOD PHOSPHATE PF 10 MG/ML IJ SOLN *I*
INTRAMUSCULAR | Status: AC
Start: 2020-09-02 — End: 2020-09-02
  Administered 2020-09-02: 8 mg via INTRAVENOUS
  Filled 2020-09-02: qty 1

## 2020-09-02 MED ORDER — FAMOTIDINE (PF) 20 MG/2ML IV SOLN *I*
20.0000 mg | Freq: Once | INTRAVENOUS | Status: AC
Start: 2020-09-02 — End: 2020-09-02

## 2020-09-02 MED ORDER — DEXAMETHASONE SOD PHOSPHATE PF 10 MG/ML IJ SOLN *I*
INTRAMUSCULAR | Status: DC
Start: 2020-09-02 — End: 2020-09-02
  Filled 2020-09-02: qty 1

## 2020-09-02 MED ORDER — DIPHENHYDRAMINE HCL 50 MG/ML IJ SOLN *I*
12.5000 mg | Freq: Once | INTRAMUSCULAR | Status: AC
Start: 2020-09-02 — End: 2020-09-02

## 2020-09-02 MED ORDER — SODIUM CHLORIDE 0.9 % IV SOLN WRAPPED *I*
30.0000 mL/h | Status: DC | PRN
Start: 2020-09-02 — End: 2020-09-02
  Administered 2020-09-02 (×2): 100 mL/h
  Administered 2020-09-02: 595 mL/h
  Administered 2020-09-02 (×3): 20 mL/h
  Administered 2020-09-02: 50 mL/h
  Administered 2020-09-02: 30 mL/h
  Administered 2020-09-02: 30 mL/h via INTRAVENOUS
  Administered 2020-09-02: 595 mL/h
  Administered 2020-09-02: 30 mL/h

## 2020-09-02 MED ORDER — DEXTROSE 5 % IV SOLN WRAPPED *I*
225.0000 mg | Freq: Once | INTRAVENOUS | Status: AC
Start: 2020-09-02 — End: 2020-09-02
  Administered 2020-09-02: 225 mg via INTRAVENOUS
  Filled 2020-09-02: qty 22.5

## 2020-09-02 MED ORDER — PROCHLORPERAZINE MALEATE 10 MG PO TABS *I*
10.0000 mg | ORAL_TABLET | Freq: Once | ORAL | Status: AC
Start: 2020-09-02 — End: 2020-09-02
  Administered 2020-09-02: 10 mg via ORAL

## 2020-09-02 MED ORDER — PALONOSETRON HCL 0.25 MG/5ML IV SOLN *I*
INTRAVENOUS | Status: AC
Start: 2020-09-02 — End: 2020-09-02
  Administered 2020-09-02: 0.25 mg via INTRAVENOUS
  Filled 2020-09-02: qty 5

## 2020-09-02 MED ORDER — DIPHENHYDRAMINE HCL 50 MG/ML IJ SOLN *I*
INTRAMUSCULAR | Status: DC
Start: 2020-09-02 — End: 2020-09-02
  Filled 2020-09-02: qty 1

## 2020-09-02 MED ORDER — DEXAMETHASONE SODIUM PHOSPHATE 10 MG/ML IJ SOLN *I*
8.0000 mg | Freq: Once | INTRAMUSCULAR | Status: AC
Start: 2020-09-02 — End: 2020-09-02

## 2020-09-02 NOTE — Telephone Encounter (Signed)
Writer spoke with patient and confirmed abx as Keflex and she should be taking (2) 250 mg capsules = 500 mg every 6 hours for 7 days.  Patient verbalized understanding and appreciation for call.

## 2020-09-02 NOTE — Progress Notes (Signed)
Patient arrived for Taxol and Carboplatin. Labs and consent noted.   Writer noticed a small open area above port at the incision site with a small amount of drainage and redness. Patient reports noticing some redness around port for last couple of days. Patient denies any recent fevers or chills. NP Nyoka Lint and Dr. Isaiah Serge came to chairside to assess.   Per Dr. Isaiah Serge will not use port today due to open area and drainage but ok to treat through a peripheral. NP Cima prescribed 7 day course of antibiotics. Patient verbalized understanding of plan and has no questions or concerns at this time.   PIV placed with difficulty in right hand by RN Barbie, blood return noted.   Pre medications aloxi, compazine, decadron, benadryl and pepcid given 30 minutes prior to taxol. Taxol then administered over 1 hour. Carboplatin administered second over 30 minutes. Patient tolerated well.   PIV discontinued, no blood return noted but flushes easily and denies any burning. Site benign.   Re enforced to patient to call [facility] if she develops a fever or notices increased redness or yellow drainage from port. Patient communicated understanding.   Patient discharged in stable condition.       Patient complied with masking policy throughout the duration of treatment. Written maintained use of mask throughout duration of treatment as well and written was within 6 feet of patient for greater than 15 minutes due to the nature of the treatment.

## 2020-09-02 NOTE — Progress Notes (Signed)
Acupuncture Treatment in Infusion room for Cancer Patient    Date: 09/02/2020  Patient Name: Carly Higgins  DOB: [birth year]    Diagnosis:IDC left,MPN (myeloproliferative neoplasm)    SUBJECTIVE:Left hip pain for a couple of years. Nails pain, toes bother as well.    OBJECTIVE:  Shen-spirit: good  Tongue body:red tip  Tongue coating:Dry and White, slight thick  Sublingle Veins:Normal  Pulse Rate:78/ minute  Pulse Quality:Slippery    TCM ASSESSMENT:Local stagnation, with heat  TCM Treatment Principle:even  Posture: on chair.    Acupuncture Points:GB31 (L), GB32 (L), GB34 (L), SP10(2), SP6(2)  Total Needles:7  Needle Retain: minutes  Other modalities: Tui Na  During treatment notes: Good    Recommendation:   Do acupressure for prevention and treatment of NEUROPATHY, please follow the link at home: [email or web address].[email or web address]       Patient provided verbal understanding and consent for acupuncture/acupressure in infusion room for the brief session treatment. They have no further questions at this time.    Patient and/ patient visitor complied with masking policy throughout duration of appointment. Writer maintained use of mask and face shield or goggles throughout duration of appointment. Writer was within 6 feet of patient for >5 minutes due to the nature of the treatment.

## 2020-09-02 NOTE — Telephone Encounter (Signed)
Pt. Calling to speak with the team in regards to Pt. Medication. Pt. Is unsure how to take the medication. Asking for a call to discuss.

## 2020-09-02 NOTE — Progress Notes (Signed)
Carly Higgins is a 60 y.o. female with triple negative left breast cancer who presents to the infusion center today for C3D8 of carbo/taxol. Notified by infusion RN that Carly Higgins has a small open area at her port site. Her port was placed at [facility] on 08/18/20. Carly Higgins says the incision site seemed to be healing well and didn't notice any open areas. She says she frequently turns from side to side in bed and wonders if she irritated the incision site. She says she has been careful when she is in the shower to not let the water directly hit the port but does brush over the site with wash cloth to clean it. She noticed increased redness around the port the last few days. She denies any fevers, chills, or increased pain at the site. On exam, there is approximately a 0.5 inch open area directly along her incision site and it appears as if the surgical glue has come undone. There is a small amount of yellow drainage at the opening with surrounding erythema around site. No tenderness noted. Picture placed in media. Assessment and findings were discussed with Dr. Isaiah Serge who evaluated patient at chairside.     Given the incision site above the port is open and there is drainage present, the port will not be used today. Per Dr. Isaiah Serge, okay to proceed with treatment today through a PIV as blood counts are not expected to drop significantly. A 7-day course of cephalexin was prescribed for a possible infection. Instructed Carly Higgins to complete the full 7-day course of antibiotics as prescribed without missing any doses and to call the office if she develops fever, chills, increased redness, or pain. Advised Carly Higgins to avoid taking baths or swimming in ponds, lakes, etc. She verbalized good understanding.      An aerobic culture of the drainage was ordered; however, when RN attempted to collect the sample, the incision site started to close on its and there was not any drainage to culture. Primary oncology team made aware and at  chairside. Will hold off on culture at this time.       Carly Lint, NP

## 2020-09-06 ENCOUNTER — Ambulatory Visit: Payer: No Typology Code available for payment source

## 2020-09-07 ENCOUNTER — Telehealth: Payer: Self-pay

## 2020-09-07 NOTE — Telephone Encounter (Signed)
831 Left Vm for patient in regards to confirming appointment x1. Will try again later.

## 2020-09-08 ENCOUNTER — Other Ambulatory Visit
Admission: RE | Admit: 2020-09-08 | Discharge: 2020-09-08 | Disposition: A | Discharge status: Home / Self Care | Payer: Medicare Other | Source: Ambulatory Visit | Attending: Hematology and Oncology | Admitting: Hematology and Oncology

## 2020-09-08 ENCOUNTER — Other Ambulatory Visit: Payer: Self-pay | Admitting: Hematology and Oncology

## 2020-09-08 DIAGNOSIS — Z171 Estrogen receptor negative status [ER-]: Secondary | ICD-10-CM | POA: Insufficient documentation

## 2020-09-08 DIAGNOSIS — C50412 Malignant neoplasm of upper-outer quadrant of left female breast: Secondary | ICD-10-CM | POA: Insufficient documentation

## 2020-09-08 DIAGNOSIS — D471 Chronic myeloproliferative disease: Secondary | ICD-10-CM | POA: Insufficient documentation

## 2020-09-08 LAB — CBC AND DIFFERENTIAL
Baso # K/uL: 0 10*3/uL (ref 0.0–0.1)
Basophil %: 0.8 %
Eos # K/uL: 0.1 10*3/uL (ref 0.0–0.4)
Eosinophil %: 1.7 %
Hematocrit: 30 % — ABNORMAL LOW (ref 34–45)
Hemoglobin: 9.9 g/dL — ABNORMAL LOW (ref 11.2–15.7)
IMM Granulocytes #: 0 10*3/uL (ref 0.0–0.0)
IMM Granulocytes: 1.1 %
Lymph # K/uL: 1.7 10*3/uL (ref 1.2–3.7)
Lymphocyte %: 46.5 %
MCH: 33 pg — ABNORMAL HIGH (ref 26–32)
MCHC: 33 g/dL (ref 32–36)
MCV: 99 fL — ABNORMAL HIGH (ref 79–95)
Mono # K/uL: 0.2 10*3/uL (ref 0.2–0.9)
Monocyte %: 6.7 %
Neut # K/uL: 1.6 10*3/uL (ref 1.6–6.1)
Nucl RBC # K/uL: 0 10*3/uL (ref 0.0–0.0)
Nucl RBC %: 0 /100 WBC (ref 0.0–0.2)
Platelets: 198 10*3/uL (ref 160–370)
RBC: 3.1 MIL/uL — ABNORMAL LOW (ref 3.9–5.2)
RDW: 18.1 % — ABNORMAL HIGH (ref 11.7–14.4)
Seg Neut %: 43.2 %
WBC: 3.6 10*3/uL — ABNORMAL LOW (ref 4.0–10.0)

## 2020-09-08 LAB — COMPREHENSIVE METABOLIC PANEL
ALT: 50 U/L — ABNORMAL HIGH (ref 0–35)
AST: 36 U/L — ABNORMAL HIGH (ref 0–35)
Albumin: 4.3 g/dL (ref 3.5–5.2)
Alk Phos: 81 U/L (ref 35–105)
Anion Gap: 13 (ref 7–16)
Bilirubin,Total: 0.5 mg/dL (ref 0.0–1.2)
CO2: 23 mmol/L (ref 20–28)
Calcium: 9.5 mg/dL (ref 8.6–10.2)
Chloride: 101 mmol/L (ref 96–108)
Creatinine: 0.71 mg/dL (ref 0.51–0.95)
Glucose: 121 mg/dL — ABNORMAL HIGH (ref 60–99)
Lab: 15 mg/dL (ref 6–20)
Potassium: 4.8 mmol/L (ref 3.3–5.1)
Sodium: 137 mmol/L (ref 133–145)
Total Protein: 6.5 g/dL (ref 6.3–7.7)
eGFR BY CREAT: 97 *

## 2020-09-08 LAB — LACTATE DEHYDROGENASE: LD: 216 U/L (ref 118–225)

## 2020-09-08 LAB — NEUTROPHIL #-INSTRUMENT: Neutrophil #-Instrument: 1.6 10*3/uL

## 2020-09-08 LAB — MULTIPLE ORDERING DOCS

## 2020-09-09 ENCOUNTER — Ambulatory Visit: Payer: Medicare Other | Attending: Hematology and Oncology

## 2020-09-09 ENCOUNTER — Telehealth: Payer: Self-pay

## 2020-09-09 ENCOUNTER — Encounter: Payer: Self-pay | Admitting: Gastroenterology

## 2020-09-09 VITALS — BP 143/86 | HR 98 | Temp 97.9°F | Wt 264.3 lb

## 2020-09-09 DIAGNOSIS — C50412 Malignant neoplasm of upper-outer quadrant of left female breast: Secondary | ICD-10-CM | POA: Insufficient documentation

## 2020-09-09 DIAGNOSIS — Z171 Estrogen receptor negative status [ER-]: Secondary | ICD-10-CM | POA: Insufficient documentation

## 2020-09-09 DIAGNOSIS — D471 Chronic myeloproliferative disease: Secondary | ICD-10-CM | POA: Insufficient documentation

## 2020-09-09 DIAGNOSIS — Z01818 Encounter for other preprocedural examination: Secondary | ICD-10-CM

## 2020-09-09 DIAGNOSIS — Z5111 Encounter for antineoplastic chemotherapy: Secondary | ICD-10-CM | POA: Insufficient documentation

## 2020-09-09 MED ORDER — HEPARIN LOCK FLUSH 10 UNIT/ML IJ SOLN WRAPPED *I*
50.0000 [IU] | INTRAVENOUS | Status: DC | PRN
Start: 2020-09-09 — End: 2020-09-09

## 2020-09-09 MED ORDER — SODIUM CHLORIDE 0.9 % IV SOLN WRAPPED *I*
80.0000 mg/m2 | Freq: Once | INTRAVENOUS | Status: AC
Start: 2020-09-09 — End: 2020-09-09
  Administered 2020-09-09: 187 mg via INTRAVENOUS
  Filled 2020-09-09: qty 31.17

## 2020-09-09 MED ORDER — PALONOSETRON HCL 0.25 MG/5ML IV SOLN *I*
INTRAVENOUS | Status: AC
Start: 2020-09-09 — End: 2020-09-09
  Administered 2020-09-09: 0.25 mg via INTRAVENOUS
  Filled 2020-09-09: qty 5

## 2020-09-09 MED ORDER — DIPHENHYDRAMINE HCL 50 MG/ML IJ SOLN *I*
INTRAMUSCULAR | Status: AC
Start: 2020-09-09 — End: 2020-09-09
  Administered 2020-09-09: 12.5 mg via INTRAVENOUS
  Filled 2020-09-09: qty 1

## 2020-09-09 MED ORDER — DEXAMETHASONE SODIUM PHOSPHATE 10 MG/ML IJ SOLN *I*
8.0000 mg | Freq: Once | INTRAMUSCULAR | Status: AC
Start: 2020-09-09 — End: 2020-09-09

## 2020-09-09 MED ORDER — PROCHLORPERAZINE MALEATE 10 MG PO TABS *I*
10.0000 mg | ORAL_TABLET | Freq: Once | ORAL | Status: AC
Start: 2020-09-09 — End: 2020-09-09
  Administered 2020-09-09: 10 mg via ORAL

## 2020-09-09 MED ORDER — FAMOTIDINE (PF) 20 MG/2ML IV SOLN *I*
20.0000 mg | Freq: Once | INTRAVENOUS | Status: AC
Start: 2020-09-09 — End: 2020-09-09

## 2020-09-09 MED ORDER — SODIUM CHLORIDE 0.9 % IV BOLUS *I*
500.0000 mL | Freq: Once | Status: AC
Start: 2020-09-09 — End: 2020-09-09
  Administered 2020-09-09: 500 mL via INTRAVENOUS

## 2020-09-09 MED ORDER — DEXTROSE 5 % IV SOLN WRAPPED *I*
225.0000 mg | Freq: Once | INTRAVENOUS | Status: AC
Start: 2020-09-09 — End: 2020-09-09
  Administered 2020-09-09: 225 mg via INTRAVENOUS
  Filled 2020-09-09: qty 22.5

## 2020-09-09 MED ORDER — FAMOTIDINE (PF) 20 MG/2ML IV SOLN *I*
INTRAVENOUS | Status: AC
Start: 2020-09-09 — End: 2020-09-09
  Administered 2020-09-09: 20 mg via INTRAVENOUS
  Filled 2020-09-09: qty 2

## 2020-09-09 MED ORDER — SODIUM CHLORIDE 0.9 % IV SOLN WRAPPED *I*
30.0000 mL/h | Status: DC | PRN
Start: 2020-09-09 — End: 2020-09-09
  Administered 2020-09-09: 30 mL/h via INTRAVENOUS

## 2020-09-09 MED ORDER — DEXAMETHASONE SOD PHOSPHATE PF 10 MG/ML IJ SOLN *I*
INTRAMUSCULAR | Status: AC
Start: 2020-09-09 — End: 2020-09-09
  Administered 2020-09-09: 8 mg via INTRAVENOUS
  Filled 2020-09-09: qty 1

## 2020-09-09 MED ORDER — DIPHENHYDRAMINE HCL 50 MG/ML IJ SOLN *I*
12.5000 mg | Freq: Once | INTRAMUSCULAR | Status: AC
Start: 2020-09-09 — End: 2020-09-09

## 2020-09-09 MED ORDER — PALONOSETRON HCL 0.25 MG/5ML IV SOLN *I*
0.2500 mg | Freq: Once | INTRAVENOUS | Status: AC
Start: 2020-09-09 — End: 2020-09-09

## 2020-09-09 MED ORDER — LEUPROLIDE ACETATE 7.5 MG IM KIT *I*
PACK | INTRAMUSCULAR | Status: DC
Start: 2020-09-09 — End: 2020-09-09
  Filled 2020-09-09: qty 7.5

## 2020-09-09 MED ORDER — SODIUM CHLORIDE 0.9 % INJ (FLUSH) WRAPPED *I*
10.0000 mL | Status: DC | PRN
Start: 2020-09-09 — End: 2020-09-09
  Administered 2020-09-09: 10 mL

## 2020-09-09 NOTE — Progress Notes (Signed)
Patient arrived for Taxol and Carboplatin. Labs and consent noted. Patients reports that the open area along suture site of port is healing but still open. Pt reports having drainage from site. Patient is to finish her oral antibiotics today. Notified Dr. Isaiah Serge and Barbie of findings. Per team will treat through a peripheral IV today. Plan for patient to have port looked at after appointment today. Patient also reported some lightheadedness for past couple of days and worsening neuropathy. Discussed with NP S. Kooman, will give 500 mL of normal saline with treatment today. Neuropathy not interfering with ADLs, ok for treatment today. Re enforced to patient to call if neuropathy worsens or starts interfering with ADLs.   PIV placed with difficulty in right hand, blood return noted. Pre medications aloxi, benadryl, pepcid, decadron and compazine. Taxol started to be administered over 1 hour. About 2 minutes into infusion. Patient reported some burning at IV site. Infusion stopped. No swelling or reddness at site. Positive blood return noted. PIV discontinued, blood return noted and site benign. Due to the burning a new PIV placed in left AC, blood return noted. Taxol restarted in new IV. Patient tolerated infusion well. Patient utilized cryotherapy to hands.  Carboplatin infused second over 30 minutes. Patient tolerated well. 500 cc of normal saline administered concurrently with treatment. PIV discontinued, blood return noted and site benign. Patient discharged in stable condition.       Patient complied with masking policy throughout the duration of treatment. Written maintained use of mask throughout duration of treatment as well and written was within 6 feet of patient for greater than 15 minutes due to the nature of the treatment.

## 2020-09-09 NOTE — Telephone Encounter (Signed)
Attempt pre call. No answer. LMTCB for [facility] Placement appointment at [facility] at [address] [place] [address].   Your arrival time is  September 12 Monday at 08:30 am   Lab work orders have been placed in your Chart. Please bring I.D. with you to any [facility] that does blood draws.    You Do NOT need to fast for this Lab work. Please allow for 24 hours for lab results to be processed.   Lab work includes  PT/INR order.   No solid food after 02:30 am.   You may have clear liquids (water, apple juice, coffee or tea with no cream) until 06:30 am, 2 hours before your procedure.     Please take your prescribed medications with a sip of water in morning. If taking blood thinners currently, please call to speak to Imaging Nurse at [phone].     Sedation can be used for this procedure. You must have someone to drive you home after your procedure.  It cannot be a ride share [place] [facility], [place] or non-medical taxi) unless you have someone accompany you.     A Covid-PCR test is required 3 to 5 days prior to procedure. A Covid order has been placed in your chart.  Please go to [address] off of [address] in [place].  An order has been placed in your Chart. Please bring I.D. to Covid testing site.  This is a Producer, television/film/video. No appointment is necessary. The phone # to [facility] testing site is provided for your convenience:  [phone].     You should have someone spend the night with you after your procedure.     If you need to reschedule this appointment please call [phone].     If you have any questions in regards to the procedure and would like to speak to a Nurse, please call [phone].

## 2020-09-13 ENCOUNTER — Ambulatory Visit: Payer: No Typology Code available for payment source

## 2020-09-14 ENCOUNTER — Telehealth: Payer: Self-pay

## 2020-09-14 ENCOUNTER — Other Ambulatory Visit
Admission: RE | Admit: 2020-09-14 | Discharge: 2020-09-14 | Disposition: A | Discharge status: Home / Self Care | Payer: Medicare Other | Source: Ambulatory Visit | Attending: Hematology and Oncology | Admitting: Hematology and Oncology

## 2020-09-14 DIAGNOSIS — C50412 Malignant neoplasm of upper-outer quadrant of left female breast: Secondary | ICD-10-CM | POA: Insufficient documentation

## 2020-09-14 DIAGNOSIS — Z01818 Encounter for other preprocedural examination: Secondary | ICD-10-CM | POA: Insufficient documentation

## 2020-09-14 DIAGNOSIS — D471 Chronic myeloproliferative disease: Secondary | ICD-10-CM | POA: Insufficient documentation

## 2020-09-14 DIAGNOSIS — Z171 Estrogen receptor negative status [ER-]: Secondary | ICD-10-CM | POA: Insufficient documentation

## 2020-09-14 LAB — CBC AND DIFFERENTIAL
Baso # K/uL: 0 10*3/uL (ref 0.0–0.1)
Basophil %: 0.5 %
Eos # K/uL: 0.1 10*3/uL (ref 0.0–0.4)
Eosinophil %: 1.4 %
Hematocrit: 31 % — ABNORMAL LOW (ref 34–45)
Hemoglobin: 10.4 g/dL — ABNORMAL LOW (ref 11.2–15.7)
IMM Granulocytes #: 0 10*3/uL (ref 0.0–0.0)
IMM Granulocytes: 0.5 %
Lymph # K/uL: 1.7 10*3/uL (ref 1.2–3.7)
Lymphocyte %: 45.5 %
MCH: 33 pg — ABNORMAL HIGH (ref 26–32)
MCHC: 34 g/dL (ref 32–36)
MCV: 98 fL — ABNORMAL HIGH (ref 79–95)
Mono # K/uL: 0.2 10*3/uL (ref 0.2–0.9)
Monocyte %: 6.5 %
Neut # K/uL: 1.7 10*3/uL (ref 1.6–6.1)
Nucl RBC # K/uL: 0 10*3/uL (ref 0.0–0.0)
Nucl RBC %: 0 /100 WBC (ref 0.0–0.2)
Platelets: 195 10*3/uL (ref 160–370)
RBC: 3.2 MIL/uL — ABNORMAL LOW (ref 3.9–5.2)
RDW: 18.6 % — ABNORMAL HIGH (ref 11.7–14.4)
Seg Neut %: 45.6 %
WBC: 3.7 10*3/uL — ABNORMAL LOW (ref 4.0–10.0)

## 2020-09-14 LAB — COMPREHENSIVE METABOLIC PANEL
ALT: 47 U/L — ABNORMAL HIGH (ref 0–35)
AST: 35 U/L (ref 0–35)
Albumin: 4.7 g/dL (ref 3.5–5.2)
Alk Phos: 91 U/L (ref 35–105)
Anion Gap: 15 (ref 7–16)
Bilirubin,Total: 0.7 mg/dL (ref 0.0–1.2)
CO2: 23 mmol/L (ref 20–28)
Calcium: 9.7 mg/dL (ref 8.6–10.2)
Chloride: 100 mmol/L (ref 96–108)
Creatinine: 0.68 mg/dL (ref 0.51–0.95)
Glucose: 117 mg/dL — ABNORMAL HIGH (ref 60–99)
Lab: 20 mg/dL (ref 6–20)
Potassium: 5 mmol/L (ref 3.3–5.1)
Sodium: 138 mmol/L (ref 133–145)
Total Protein: 7.1 g/dL (ref 6.3–7.7)
eGFR BY CREAT: 99 *

## 2020-09-14 LAB — NEUTROPHIL #-INSTRUMENT: Neutrophil #-Instrument: 1.7 10*3/uL

## 2020-09-14 LAB — PROTIME-INR
INR: 0.9 (ref 0.9–1.1)
Protime: 10.1 s (ref 10.0–12.9)

## 2020-09-14 LAB — LACTATE DEHYDROGENASE: LD: 239 U/L — ABNORMAL HIGH (ref 118–225)

## 2020-09-14 NOTE — Telephone Encounter (Signed)
Attempt pre call to patient for port placement. Patient states port was already placed at [facility]. Patient would like to cancel appt. Scheduling notified.

## 2020-09-15 ENCOUNTER — Ambulatory Visit: Payer: Medicare Other

## 2020-09-15 ENCOUNTER — Ambulatory Visit: Payer: Medicare Other | Attending: Oncology | Admitting: Oncology

## 2020-09-15 VITALS — BP 127/80 | HR 103 | Temp 96.4°F | Wt 265.0 lb

## 2020-09-15 DIAGNOSIS — C50412 Malignant neoplasm of upper-outer quadrant of left female breast: Secondary | ICD-10-CM

## 2020-09-15 DIAGNOSIS — D471 Chronic myeloproliferative disease: Secondary | ICD-10-CM | POA: Insufficient documentation

## 2020-09-15 DIAGNOSIS — R42 Dizziness and giddiness: Secondary | ICD-10-CM

## 2020-09-15 DIAGNOSIS — R55 Syncope and collapse: Secondary | ICD-10-CM

## 2020-09-15 DIAGNOSIS — Z171 Estrogen receptor negative status [ER-]: Secondary | ICD-10-CM | POA: Insufficient documentation

## 2020-09-15 DIAGNOSIS — Z5111 Encounter for antineoplastic chemotherapy: Secondary | ICD-10-CM | POA: Insufficient documentation

## 2020-09-15 DIAGNOSIS — Z01818 Encounter for other preprocedural examination: Secondary | ICD-10-CM

## 2020-09-15 DIAGNOSIS — Z9481 Bone marrow transplant status: Secondary | ICD-10-CM

## 2020-09-15 MED ORDER — DEXAMETHASONE SODIUM PHOSPHATE 10 MG/ML IJ SOLN *I*
8.0000 mg | Freq: Once | INTRAMUSCULAR | Status: AC
Start: 2020-09-15 — End: 2020-09-15
  Administered 2020-09-15: 8 mg via INTRAVENOUS

## 2020-09-15 MED ORDER — FAMOTIDINE (PF) 20 MG/2ML IV SOLN *I*
20.0000 mg | Freq: Once | INTRAVENOUS | Status: AC
Start: 2020-09-15 — End: 2020-09-15
  Administered 2020-09-15: 20 mg via INTRAVENOUS

## 2020-09-15 MED ORDER — SEQUENCING OF DRUGS FOR ADMINISTRATION *I*
1.0000 | Status: DC | PRN
Start: 2020-09-15 — End: 2020-09-15
  Filled 2020-09-15: qty 1

## 2020-09-15 MED ORDER — DIPHENHYDRAMINE HCL 50 MG/ML IJ SOLN *I*
INTRAMUSCULAR | Status: AC
Start: 2020-09-15 — End: 2020-09-15
  Filled 2020-09-15: qty 1

## 2020-09-15 MED ORDER — DEXAMETHASONE SOD PHOSPHATE PF 10 MG/ML IJ SOLN *I*
INTRAMUSCULAR | Status: AC
Start: 2020-09-15 — End: 2020-09-15
  Filled 2020-09-15: qty 1

## 2020-09-15 MED ORDER — HEPARIN LOCK FLUSH 10 UNIT/ML IJ SOLN WRAPPED *I*
50.0000 [IU] | INTRAVENOUS | Status: DC | PRN
Start: 2020-09-15 — End: 2020-09-15
  Administered 2020-09-15: 50 [IU]

## 2020-09-15 MED ORDER — PROCHLORPERAZINE MALEATE 10 MG PO TABS *I*
10.0000 mg | ORAL_TABLET | Freq: Once | ORAL | Status: AC
Start: 2020-09-15 — End: 2020-09-15
  Administered 2020-09-15: 10 mg via ORAL

## 2020-09-15 MED ORDER — PALONOSETRON HCL 0.25 MG/5ML IV SOLN *I*
INTRAVENOUS | Status: AC
Start: 2020-09-15 — End: 2020-09-15
  Filled 2020-09-15: qty 5

## 2020-09-15 MED ORDER — SODIUM CHLORIDE 0.9 % IV SOLN WRAPPED *I*
30.0000 mL/h | Status: DC | PRN
Start: 2020-09-15 — End: 2020-09-15
  Administered 2020-09-15: 30 mL/h
  Administered 2020-09-15 (×3): 100 mL/h
  Administered 2020-09-15: 30 mL/h via INTRAVENOUS
  Administered 2020-09-15: 100 mL/h
  Administered 2020-09-15: 30 mL/h
  Administered 2020-09-15: 600 mL/h

## 2020-09-15 MED ORDER — SODIUM CHLORIDE 0.9 % INJ (FLUSH) WRAPPED *I*
10.0000 mL | Status: DC | PRN
Start: 2020-09-15 — End: 2020-09-15
  Administered 2020-09-15 (×2): 10 mL

## 2020-09-15 MED ORDER — DEXTROSE 5 % IV SOLN WRAPPED *I*
225.0000 mg | Freq: Once | INTRAVENOUS | Status: AC
Start: 2020-09-15 — End: 2020-09-15
  Administered 2020-09-15: 225 mg via INTRAVENOUS
  Filled 2020-09-15: qty 22.5

## 2020-09-15 MED ORDER — PALONOSETRON HCL 0.25 MG/5ML IV SOLN *I*
0.2500 mg | Freq: Once | INTRAVENOUS | Status: AC
Start: 2020-09-15 — End: 2020-09-15
  Administered 2020-09-15: 0.25 mg via INTRAVENOUS

## 2020-09-15 MED ORDER — DIPHENHYDRAMINE HCL 50 MG/ML IJ SOLN *I*
12.5000 mg | Freq: Once | INTRAMUSCULAR | Status: AC
Start: 2020-09-15 — End: 2020-09-15
  Administered 2020-09-15: 12.5 mg via INTRAVENOUS

## 2020-09-15 MED ORDER — FAMOTIDINE (PF) 20 MG/2ML IV SOLN *I*
INTRAVENOUS | Status: AC
Start: 2020-09-15 — End: 2020-09-15
  Filled 2020-09-15: qty 2

## 2020-09-15 MED ORDER — SODIUM CHLORIDE 0.9 % IV SOLN WRAPPED *I*
80.0000 mg/m2 | Freq: Once | INTRAVENOUS | Status: AC
Start: 2020-09-15 — End: 2020-09-15
  Administered 2020-09-15: 187 mg via INTRAVENOUS
  Filled 2020-09-15: qty 31.17

## 2020-09-15 NOTE — Progress Notes (Signed)
[facility]     FOLLOW-UP VISIT    PATIENT NAME:  Carly Higgins, Carly Higgins    DOB:  05/06/1960   MRN:  [id number]  09/15/2020     DIAGNOSIS:  Clinical stage IIB left breast cancer    REASON FOR VISIT: Alondra Vandeven is seen today in anticipation of starting cycle 4 of weekly paclitaxel and carboplatin as neoadjuvant chemotherapy for clinical stage IIB (cT2, cN0, cM0) grade 3, ER negative, PR negative, HER2 negative infiltrating ductal carcinoma of the left breast.    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  In February 2022 she began to experience discomfort in the left breast.  She first attributed this to having been struck with a part of her dog when she picked it up about 1 week earlier.  She continued to experience discomfort intermittently.  She was in [place] at the time and did not seek any evaluation.      When she returned to [place] she scheduled a screening mammogram, having not had an exam since July 2018.  This was performed on May 18, 2020 and revealed an area of architectural distortion in the middle third of the left breast upper outer quadrant at the 2 o'clock position.    On Jun 07, 2020 diagnostic mammogram and breast ultrasound was performed.  The mammogram revealed scattered areas of fibroglandular densities with an area of architectural distortion again noted at the 2 o'clock position of the left breast.  Breast ultrasound revealed an irregular mass measuring 19 x 19 x 12 mm in the middle third upper outer quadrant of the left breast.  Internal echotexture was hypoechoic and there was internal vascularity.      Based on these findings, ultrasound-guided core biopsy of the left breast 2:00 lesion was performed on June 09, 2020.  Biopsy revealed invasive ductal carcinoma.  Provisional combined Nottingham grade was 3 of 3 (tubule formation score 3, nuclear grade 3, mitotic rate  score 2).  Tumor cells were negative for estrogen receptors and progesterone receptors.  Tumor cells were negative for HER2 by immunohistochemical staining with a staining score of 1+.  Ki-67 staining was seen in approximately 70% of tumor cells.    She was seen by Dr. Freda Munro for surgical consultation at which time she was noted to have a masslike area at the 2 o'clock position of the left breast which felt like a hematoma.  Staging scans were requested.    She was seen in the Hereditary Oncology clinic on June 13, 2020. Because of her prior allogeneic bone marrow transplant, she was not eligible for standard germline testing with blood or saliva specimen.      Whole-body bone scan performed on June 20, 2020 revealed no abnormal focus of radiotracer uptake to suggest osseous metastatic disease.    CT of the chest performed with contrast on June 22, 2020 revealed an ill-defined enhancing lesion in the lateral left breast measuring 1.7 x 1.2 x 1.1 cm.  There were asymmetrically prominent level 1 left axillary lymph nodes that demonstrated enhancement of uncertain significance.  These were felt possibly to represent metastatic disease.  There were no enlarged mediastinal, hilar, intramammary or supraclavicular lymph nodes.  No suspicious pulmonary nodules were identified.  CT of the abdomen pelvis performed at the same time revealed no evidence of metastatic disease within the abdomen and pelvis.  A small fat filled umbilical hernia  was noted.    MRI of the breast performed on June 28, 2020 revealed an irregular mass with spiculated margins measuring 10 x 25 x 19 mm in the left breast upper outer quadrant at the 2 o'clock position located 3 cm from the nipple.    Neoadjuvant chemotherapy was recommended and she began cycle 1 of weekly paclitaxel and carboplatin on July 15, 2020.  Immunotherapy with pembrolizumab was not recommended due to her previous allogenic bone marrow transplant.  On August 18, 2020 she  underwent placement of PowerPort in the right internal jugular vein.      INTERVAL HISTORY:   She reports that she was recently started on tirzepatide for treatment of type 2 diabetes mellitus but her insurance won't cover it anymore.    She reports that she has continued to tolerate the treatment well.  She is no longer experiencing pain in the breast and feels that the mass has decreased in size.  Appetite has been good.  She denies fever or symptoms of infection.  She reports feeling severe fatigue a few days after each treatment.  This seems to be getting worse with each cycle.  She denies mouth sores.  She has noted some mild exertional dyspnea which is unchanged from her recent baseline.  She describes it as needing to sit for a minute when she sits up from a supine position because she knows the dizziness and heavy breathing are going to start.  She denies cough or other respiratory symptoms.  She has no cardiac symptoms including exertional chest pain palpitations.  She has noted nausea on occasion for which she has taken ondansetron and prochlorperazine.  She has not vomited.  She reports occasional constipation.  She has no other gastrointestinal symptoms including abdominal pain and bloating.  She has no urinary symptoms.  She continues to have left hip pain on occasion.  She has no other musculoskeletal symptoms including back pain or bone pain.  She denies swelling of her extremities.  She denies headaches.  She reports no cognitive changes.  She has no focal motor symptoms.  She has started to note some tingling in the first toe of each foot and reports it feels weird to have a bedsheet across them.  She feels like someone is hammering on her fingernails.  Gait remains unsteady due to her hip pain.  She has no dermatologic symptoms.    She reports that she has still been smoking several cigarettes a day and knows she needs to quit before she can have breast surgery.    PAST MEDICAL/SURGICAL HISTORY:    1. 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  Prior to bone marrow transplant she received 5 cycles of decitabine.  She received fludarabine, busulfan and and hydroxyurea for the transplant and received methotrexate for graft-versus-host prophylaxis.  2. Hypothyroid.  3. Anxiety.  4. Osteoarthritis of the hips, left greater than right.  5. Mild diverticulosis of the sigmoid colon.  6. History of atrial fibrillation and congestive heart failure in 2017 related to the bone marrow transplant.  7. Right cataract surgery.  8. Right rotator cuff surgery.    MEDICATIONS:     Current Outpatient Medications   Medication Sig Note    Tirzepatide [facility]) Inject into the skin     acyclovir (ZOVIRAX) 400 mg tablet Take 400 mg by mouth 2 times daily     ondansetron (ZOFRAN)  8 mg tablet Take 1 tablet (8 mg total) by mouth every 8 hours as needed  Begin 72 hours after treatment if needed for nausea     prochlorperazine (COMPAZINE) 10 mg tablet Take 1 tablet (10 mg total) by mouth every 6 hours as needed (nausea)     metoprolol tartrate (LOPRESSOR) 50 mg tablet Take 1 tablet (50 mg total) by mouth 2 times daily     ondansetron (ZOFRAN) 4 mg tablet Take 1 tablet (4 mg total) by mouth 3 times daily as needed (nausea)     cholecalciferol (VITAMIN D) 50 MCG (2000 UT) tablet Take 1,000 Units by mouth daily     levothyroxine (SYNTHROID, LEVOTHROID) 50 MCG tablet Take 50 mcg by mouth daily (before breakfast)     escitalopram (LEXAPRO) 20 MG tablet Take 20 mg by mouth daily 05/15/2016: Received from: External Pharmacy Received Sig: TAKE 1 TABLETS ORALLY EVERY DAY    aspirin 81 MG EC tablet Take 81 mg by mouth daily          ROS: As outlined above and in the nursing assessment. Comprehensive review of systems is otherwise negative.        Last Filed Vitals    09/15/20 1345   BP: 127/80   Pulse: 103    Temp: 35.8 C (96.4 F)   SpO2: 96%       PHYSICAL EXAMINATION: General: Reveals a well-appearing woman. ECOG performance status is 1.  Weight is relatively stable. HEENT exam: grossly unremarkable.  Sclerae are anicteric. Conjunctivae are pink.  Oral mucosa is without stomatitis or thrush. Lymph nodes: There is no palpable cervical, supraclavicular, axillary or inguinal lymph node enlargement. Lungs: clear to auscultation and percussion.  Cardiac exam:  reveals a regular rate and rhythm. No murmur is noted.  Chest: PowerPort is present in the upper chest on the right.  Breasts: Left breast has a palpable mass in the upper outer quadrant measuring approximately 2.5 cm x 1.5 cm.  Overlying skin is unremarkable.  Abdomen: is soft.  Bowel sounds are normoactive.  There is no hepatosplenomegaly, mass or ascites detected by palpation and percussion..  Extremities: are without edema.  Neurologic exam: is grossly nonfocal. Skin: is without rash, petechiae or ecchymoses.  Again noted are patches of vitiligo in the upper abdomen.  No erythema or dryness of palms, no nail changes.    LABORATORY DATA:         Lab results: 09/14/20  0942 09/08/20  0834   WBC 3.7* 3.6*   Hemoglobin 10.4* 9.9*   Hematocrit 31* 30*   Platelets 195 198   Neut # K/uL 1.7 1.6        Lab results: 09/14/20  0942 09/08/20  0834   Sodium 138 137   Potassium 5.0 4.8   Chloride 100 101   CO2 23 23   UN 20 15   Creatinine 0.68 0.71   Glucose 117* 121*   Calcium 9.7 9.5        Lab results: 09/14/20  0942 09/08/20  0834   AST 35 36*   ALT 47* 50*   Alk Phos 91 81   Bilirubin,Total 0.7 0.5           09/14/20 09/08/20  LD          239      216         IMAGING DATA: No new imaging data    ASSESSMENT: Ms. Quilling has continued to tolerate chemotherapy well.  She has noted some mild numbness in the first toe of each foot on occasion.  This is not a contraindication to continuing with treatment but we will need to monitor closely for further progression of symptoms.   She also has increasing fatigue.  Her breast tumor appears to be responding to the chemotherapy.  I have recommended that she continue with treatment.  She is agreeable and will start cycle 4 today.  She will return for follow-up in 3 weeks to receive the first of four cycles of doxorubicin and cyclophosphamide given every three weeks.     She has been smoking cigarettes and knows she needs to quit in order to have surgery.  We discussed strategies for cutting down by one cigarette at a time.    PLAN:     Clinical stage IIB triple negative left breast cancer     Start cycle 4 of weekly paclitaxel and carboplatin today.  Treatment will consist of paclitaxel 80 mg/m and carboplatin AUC of 1.5 administered on days 1, 8 and 15 of each 21-day cycle.     Follow-up in 3 weeks with anticipation of starting 4 cycles of doxorubicin 60 mg/m and cyclophosphamide 600 mg/m on day 1 of each 21-day cycle.     Hold on immunotherapy with pembrolizumab in light of her previous allogenic bone marrow transplant.     Surgical resection after completion of neoadjuvant chemotherapy.  We discussed how long that would take and that after surgery, she would need radiation therapy if she decides to have partial mastectomy.    Presyncope     I suspect this is from mild dehydration, as it sounds like she is orthostatic when she first gets up in the morning.  Recommended increasing water intake and decreasing caffeine intake.    COVID-19     Patient has received the [place] COVID-19 vaccine, receiving the first dose on May 15, 2019, second dose on Jun 05, 2019 and booster dose on October 08, 2019.     She is eligible for a second booster dose.    I have recommend that she consider getting this in the next few months, preferably with the newer version of the vaccine when it becomes available.     Patient advised to continue to take precautions to reduce risk of contracting COVID-19, including social distancing, frequent  handwashing and using a mask when in public.    Health maintenance     Patient encouraged to stop smoking cigarettes entirely.     Recommend patient receive flu vaccine in the next few months.    Follow-up     Return for follow-up in 3 weeks.    Due to concerns regarding COVID-19, a facemask was worn by myself as well as the patient throughout the office visit, except lately to allow for inspection of the oral cavity.      Royann Shivers, PA      HQ:PRFFM Cahn-Hidalgo, MD  Minus Liberty, MD  Assunta Curtis, MD

## 2020-09-15 NOTE — Progress Notes (Signed)
ECOG Performance 1    Patient states that she saw Dr/ Speller today to assess mediport.  Dr Roderic Scarce states that incision line is healing and it is ok to use.      ROS:  Appetite normal yes  Weight change yes - down 0.5 kg  Fever   no  Night sweats  no  Fatigue  yes - severe  Bleeding   no  Cough    no  Dyspnea  yes - Occasional, with any activity for the past few weeks.  Resolves within a minute of change in activity  Chest pain  no  Palpitations  no  Nausea  no  Vomiting  no  Dysphagia  no  Mucositis  no  Diarrhea  no  Constipation  yes - mild and resolves on its own.    Abdominal pain no  Urinary symptoms no  Back pain  no  Joint pain  yes - Chronic (L) hip pain which occasionally radiates down to the top pf the knee  Bone pain  no  Leg/arm swelling no  Headache  no  Mental status change no  Motor weakness yes - occasional weakness in legs.    Sensory change yes  - constant tingling in toes.  Taste changes for 3 days after treatment.    Gait disturbance yes - r/t hip pain.    Rash    no    LMP:  MMG:  GYN:  DEXA:  COLONOSCOPY:

## 2020-09-15 NOTE — Progress Notes (Addendum)
[facility] TREATMENT HAND-OFF TOOL:   SITUATION:   Scheduled treatment category for today:     Cancer treatment:  Chemotherapy and Other    Others:  Patient scheduled for Taxol / Carbo    Is this a new cancer treatment?: No      Patient seen by MD    Consent obtained:  Yes    Location:  [facility]    Labs complete:  Yes    Within parameters: Yes      Current patient status:  Scheduled treatment    OK to treat for scheduled treatment:  Yes    Okay for Taxol/carbo today.    [place], PA

## 2020-09-15 NOTE — Patient Instructions (Signed)
September 2022      'Sunday Monday Tuesday Wednesday Thursday Friday Saturday                       1    Outpatient Visit   8:25 AM   [facility]    CHEMOTHERAPY   8:30 AM   (150 min.)   Ryan, Caitlin, RN   [facility] 3       4     5     6     7    Outpatient Visit   9:31 AM   [facility]    FOLLOW UP VISIT   1:30 PM   (30 min.)   Dudrak, Kimberly A, PA   [facility] Medical Oncology    CHEMOTHERAPY   2:30 PM   (150 min.)   CAN CTR, PLUTA POD D   [facility] 9     10       11     12     13    ACUPUNCTURE  10:30 AM   (60 min.)   Fang, Jin, LAC   [facility] Integr[facility] On[facility] 14     15     16    CHEMOTHERAPY   8:30 AM   (150 min.)   CAN CTR, PLUTA POD D   [facility] 17       18     19     20    ACUPUNCTURE  10:30 AM   (60 min.)   Fang, Jin, LAC   [facility] 21     22     23    CHEMOTHERAPY   8:30 AM   (150 min.)   CAN CTR, PLUTA POD B   [facility] 24       25     26     27     28     29    FOLLOW UP VISIT  10:00 AM   (30 min.)   Yirinec, Brian D, MD   [facility]cology    CHEMOTHERAPY  11:00 AM   (150 min.)   CAN CTR, PLUTA POD D   [facility] 06 November 2020      Sunday Monday Tuesday Wednesday Thursday Friday Saturday                                 1       2     3     4     5     6     7     8       9     10     11     12     13     14     15       16     17     18     19     20     21     22       23     24'$   $'25     26     27     28     29       30     31                                          'i$ November 2022      'Sunday Monday Tuesday Wednesday Thursday Friday Saturday             1     2     3     4     5       6     7     8     9     10    FOLLOW UP VISIT   3:30 PM   (30 min.)   Liesveld, Jane, MD   [facility] 11     12       13     14     15     16      17     18     19       20     21     22     23     24     25     26       27     28     29     30'$

## 2020-09-15 NOTE — Progress Notes (Signed)
Patient presents from clinic for weekly Taxol Carbo. Consent, handoff, labs, assessment, BSA reviewed, all are within parameters to treat.     OK to use port noted on 9/2. Open area on incision site- scant purulent drainage on dressing. Port accessed without difficulty. Flushes easily and blood return noted. Hypafix dressing placed, clean dry and intact. Pre medications administered per orders followed by a 30 minute wait period. Taxol infused over 1 hour (pt utilized cyrotherapy to toes throughout) followed by Carbo over 30 minutes, pt tolerated well. Port deaccessed without issues and flushed with Heparin per policy. Gauze dressing placed over site and incision site was dressed with gauze and paper tape.  Patient discharged in stable condition, aware to call with any questions or concerns and was sent home with supplies for her dressing change - verbalized understanding of care at home.    Patient complied with masking policy throughout duration of appointment. Writer maintained use of mask throughout duration of appointment. Writer was within 6 feet of patient for > 15 minutes due to the nature of the treatment.

## 2020-09-19 ENCOUNTER — Other Ambulatory Visit: Payer: No Typology Code available for payment source

## 2020-09-20 ENCOUNTER — Ambulatory Visit: Payer: No Typology Code available for payment source | Admitting: Acupuncturist

## 2020-09-20 NOTE — Progress Notes (Incomplete)
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    Subjective/Chief Complaint: Patient reports     TCM Subjective:  Expression: Calm  Complexion: Pale  Voice: Normal  Inquiring: Cold Drinks  Appetite: Normal  Life Style: Coffee and Water  Bowels: Normal  Urine: Normal Color  General Body Aching: {General Body Aching:23093}  Pain Characters: {Pain Characters:23081}  Pain Level: {PAIN LEVELS:22940}  Numbness Level: {Numbness Level:25045}  Mental: {Mental:23079}  Menstrual: None  Dizziness: No  Ringing in the Ear:  No     TCM Objective: Patient appears calm  Tongue Body: Pale  Coating: Thin and Dry  Pulse Characters: Left Side: Slow and Weak Right Side: Slow and Weak     Patient past history, current pertinent medical history, lab results, acupuncture procedure, benefits limitation were discussed with patient as needed. They have no further questions at this time.    Observation/Assessment/TCM Diagnosis:  Kidney Qi Deficiency  Blood Stagnancy    Involved Meridians:   Kidney [facility]  Treatment: Provide 60 minutes of treatment which including intake and supportive care, hands on time of 45 minutes of acupuncture.    Position Supine:  '[]'$  Scalp '[]'$  Forehead '[]'$  Face '[x]'$  Neck '[]'$  Shoulders '[]'$  Arms '[]'$  Hands '[]'$  Upper legs '[]'$  Lower legs '[]'$  Feet  Position Prone:  '[]'$  Left side-lying '[]'$  Right side-lying '[]'$  Back '[]'$  Neck '[]'$  Head '[]'$  Upper legs '[]'$  Lower legs '[]'$  Feet  Position Support:  '[]'$  Upper body incline '[x]'$  Head support '[x]'$  Knee support '[]'$  Breast bolster '[]'$  Shoulder support    Cancer Treatment Points:  1. Improve White Blood Cells and Boost Energy:  ??R 14      '[]'$    ??BL 17      '[]'$   ??R 6        '[]'$   ??R 4          '[]'$   ??R 14      '[]'$   ??UB 11 '[]'$   ??UB 20   '[]'$   ??UG 18    '[]'$   ??UB 21 '[]'$   ??GB 39   '[]'$    ??SP 10    '[x]'$   ??P 6        '[]'$    ??R 4         '[]'$   ??R 6      '[]'$   ??BL 17    '[]'$    ??UB 20      '[]'$   ??K 3        '[]'$    ???ST 36  '[x]'$   ??LR 3       '[]'$    ???SP 6    '[x]'$  head 5 needles and     2. Warm Ronny Flurry and Tonic Qi (pale face and fatigue):  ??R 4  '[]'$  ??R 14 '[]'$   ??DU 4 '[]'$  ??UB 43 '[]'$   ??R 6  '[]'$  ???ST 36 '[]'$   ???UB 24 '[]'$  UB 52  '[]'$   ??DU 20 '[x]'$  ??R 14 '[]'$     3. Spleen and Kidney Tonic (nausea or vomiting)  ???ST 36 '[x]'$  ???SP 6 '[x]'$    ??LI 11 '[]'$  ??PR 11  '[]'$   ????/?????  ??SP 10 '[]'$  ??R 6  '[]'$   ??R 6  '[]'$  ??R 4  '[]'$   ??P 6  '[]'$   ??UB 17 '[]'$   ??P 6  '[]'$  ??K 6  '[]'$   ??P 7  '[]'$  ??  SJ 5 '[]'$   ??SP 18 '[]'$  ??GB 43 '[]'$     4.Tonic Qi and Ying (posible phase IV)  ???SP 9 '[x]'$  ???GB 34  '[x]'$   ??  ???Sp 9 '[]'$   ??K 1  '[]'$   ??R 14 '[]'$  ??BL 17 '[]'$   ??K 3  '[]'$  ???ST 36 '[]'$     Ear Acupuncture points:  '[]'$  ??? Endocrine IC 8  '[]'$  ???? Sympathetic Nerve AH4  '[]'$  ???? Sciatica AH8  '[]'$  ??? Lumbar Vertebrae AH10  '[]'$  ?? Thoracic Vertebrae AH11  '[]'$  ? Knees AH3   '[]'$  ??? Shoulders Joints SF7   '[]'$  ? Elbow SF 5  '[]'$  ? Wrists SF2  '[]'$  ? Fingers SF1   '[]'$  ? Toe AH1  '[]'$  ??? Ankle AH2   '[]'$  ?? Tf1     Total Needles ***  Infrared Heat Lamp Applied Yes '[x]'$  No '[]'$   Patient response(s) to acupuncture treatment: patient reports positive benefits. ***    Plan / Recommendations:  Continue comfort acupuncture addressing specific symptoms as indicated.  Patient received *** complementary acupuncture sessions.   Patient {IS/IS BH:[id number] eligible for *** additional sessions ***.    Musical Therapy: {Music Therapy:23138}     Patient complied with masking policy throughout duration of appointment. Corinna Gab maintained use of a PPE throughout duration of appointment. Corinna Gab was within 6 feet of patient for less than 5 minutes due to the nature of the treatment.

## 2020-09-22 ENCOUNTER — Other Ambulatory Visit
Admission: RE | Admit: 2020-09-22 | Discharge: 2020-09-22 | Disposition: A | Discharge status: Home / Self Care | Payer: Medicare Other | Source: Ambulatory Visit | Attending: Hematology and Oncology | Admitting: Hematology and Oncology

## 2020-09-22 DIAGNOSIS — C50412 Malignant neoplasm of upper-outer quadrant of left female breast: Secondary | ICD-10-CM | POA: Insufficient documentation

## 2020-09-22 DIAGNOSIS — D471 Chronic myeloproliferative disease: Secondary | ICD-10-CM | POA: Insufficient documentation

## 2020-09-22 DIAGNOSIS — Z171 Estrogen receptor negative status [ER-]: Secondary | ICD-10-CM | POA: Insufficient documentation

## 2020-09-22 LAB — COMPREHENSIVE METABOLIC PANEL
ALT: 38 U/L — ABNORMAL HIGH (ref 0–35)
AST: 30 U/L (ref 0–35)
Albumin: 4 g/dL (ref 3.5–5.2)
Alk Phos: 86 U/L (ref 35–105)
Anion Gap: 13 (ref 7–16)
Bilirubin,Total: 0.5 mg/dL (ref 0.0–1.2)
CO2: 21 mmol/L (ref 20–28)
Calcium: 9.4 mg/dL (ref 8.6–10.2)
Chloride: 101 mmol/L (ref 96–108)
Creatinine: 0.7 mg/dL (ref 0.51–0.95)
Glucose: 157 mg/dL — ABNORMAL HIGH (ref 60–99)
Lab: 11 mg/dL (ref 6–20)
Potassium: 4.6 mmol/L (ref 3.3–5.1)
Sodium: 135 mmol/L (ref 133–145)
Total Protein: 6.6 g/dL (ref 6.3–7.7)
eGFR BY CREAT: 99 *

## 2020-09-22 LAB — CBC AND DIFFERENTIAL
Baso # K/uL: 0 10*3/uL (ref 0.0–0.1)
Basophil %: 0 %
Eos # K/uL: 0.2 10*3/uL (ref 0.0–0.4)
Eosinophil %: 4.4 %
Hematocrit: 28 % — ABNORMAL LOW (ref 34–45)
Hemoglobin: 9.1 g/dL — ABNORMAL LOW (ref 11.2–15.7)
Lymph # K/uL: 1.4 10*3/uL (ref 1.2–3.7)
Lymphocyte %: 42.1 %
MCH: 33 pg — ABNORMAL HIGH (ref 26–32)
MCHC: 33 g/dL (ref 32–36)
MCV: 100 fL — ABNORMAL HIGH (ref 79–95)
Mono # K/uL: 0.1 10*3/uL — ABNORMAL LOW (ref 0.2–0.9)
Monocyte %: 3.5 %
Neut # K/uL: 1.6 10*3/uL (ref 1.6–6.1)
Nucl RBC # K/uL: 0 10*3/uL (ref 0.0–0.0)
Nucl RBC %: 0.9 /100 WBC — ABNORMAL HIGH (ref 0.0–0.2)
Platelets: 240 10*3/uL (ref 160–370)
RBC: 2.8 MIL/uL — ABNORMAL LOW (ref 3.9–5.2)
RDW: 19.8 % — ABNORMAL HIGH (ref 11.7–14.4)
Seg Neut %: 47.3 %
WBC: 3.4 10*3/uL — ABNORMAL LOW (ref 4.0–10.0)

## 2020-09-22 LAB — DIFF MANUAL
Bands %: 1 % (ref 0–10)
Diff Based On: 114 CELLS
Myelocyte %: 2 % — ABNORMAL HIGH (ref 0–0)

## 2020-09-22 LAB — NEUTROPHIL #-INSTRUMENT: Neutrophil #-Instrument: 1.6 10*3/uL

## 2020-09-22 LAB — LACTATE DEHYDROGENASE: LD: 246 U/L — ABNORMAL HIGH (ref 118–225)

## 2020-09-23 ENCOUNTER — Ambulatory Visit: Payer: Medicare Other | Attending: Oncology

## 2020-09-23 VITALS — BP 134/85 | HR 99 | Temp 96.8°F | Resp 16 | Wt 263.4 lb

## 2020-09-23 DIAGNOSIS — Z5111 Encounter for antineoplastic chemotherapy: Secondary | ICD-10-CM | POA: Insufficient documentation

## 2020-09-23 DIAGNOSIS — Z171 Estrogen receptor negative status [ER-]: Secondary | ICD-10-CM | POA: Insufficient documentation

## 2020-09-23 DIAGNOSIS — D471 Chronic myeloproliferative disease: Secondary | ICD-10-CM | POA: Insufficient documentation

## 2020-09-23 DIAGNOSIS — C50412 Malignant neoplasm of upper-outer quadrant of left female breast: Secondary | ICD-10-CM | POA: Insufficient documentation

## 2020-09-23 MED ORDER — FAMOTIDINE (PF) 20 MG/2ML IV SOLN *I*
INTRAVENOUS | Status: AC
Start: 2020-09-23 — End: 2020-09-23
  Administered 2020-09-23: 20 mg via INTRAVENOUS
  Filled 2020-09-23: qty 2

## 2020-09-23 MED ORDER — DEXTROSE 5 % IV SOLN WRAPPED *I*
225.0000 mg | Freq: Once | INTRAVENOUS | Status: AC
Start: 2020-09-23 — End: 2020-09-23
  Administered 2020-09-23: 225 mg via INTRAVENOUS
  Filled 2020-09-23: qty 22.5

## 2020-09-23 MED ORDER — SODIUM CHLORIDE 0.9 % IV SOLN WRAPPED *I*
30.0000 mL/h | Status: DC | PRN
Start: 2020-09-23 — End: 2020-09-23
  Administered 2020-09-23: 30 mL/h via INTRAVENOUS

## 2020-09-23 MED ORDER — DEXAMETHASONE SODIUM PHOSPHATE 10 MG/ML IJ SOLN *I*
8.0000 mg | Freq: Once | INTRAMUSCULAR | Status: AC
Start: 2020-09-23 — End: 2020-09-23

## 2020-09-23 MED ORDER — SODIUM CHLORIDE 0.9 % IV SOLN WRAPPED *I*
80.0000 mg/m2 | Freq: Once | INTRAVENOUS | Status: AC
Start: 2020-09-23 — End: 2020-09-23
  Administered 2020-09-23: 187 mg via INTRAVENOUS
  Filled 2020-09-23: qty 31.17

## 2020-09-23 MED ORDER — DEXAMETHASONE SOD PHOSPHATE PF 10 MG/ML IJ SOLN *I*
INTRAMUSCULAR | Status: AC
Start: 2020-09-23 — End: 2020-09-23
  Administered 2020-09-23: 8 mg via INTRAVENOUS
  Filled 2020-09-23: qty 1

## 2020-09-23 MED ORDER — SEQUENCING OF DRUGS FOR ADMINISTRATION *I*
1.0000 | Status: DC | PRN
Start: 2020-09-23 — End: 2020-09-23
  Filled 2020-09-23: qty 1

## 2020-09-23 MED ORDER — SULFAMETHOXAZOLE-TRIMETHOPRIM 800-160 MG PO TABS *I*
1.0000 | ORAL_TABLET | Freq: Two times a day (BID) | ORAL | 0 refills | Status: AC
Start: 2020-09-23 — End: 2020-09-28

## 2020-09-23 MED ORDER — HEPARIN LOCK FLUSH 10 UNIT/ML IJ SOLN WRAPPED *I*
50.0000 [IU] | INTRAVENOUS | Status: DC | PRN
Start: 2020-09-23 — End: 2020-09-23
  Administered 2020-09-23: 50 [IU]

## 2020-09-23 MED ORDER — PALONOSETRON HCL 0.25 MG/5ML IV SOLN *I*
INTRAVENOUS | Status: AC
Start: 2020-09-23 — End: 2020-09-23
  Administered 2020-09-23: 0.25 mg via INTRAVENOUS
  Filled 2020-09-23: qty 5

## 2020-09-23 MED ORDER — FAMOTIDINE (PF) 20 MG/2ML IV SOLN *I*
20.0000 mg | Freq: Once | INTRAVENOUS | Status: AC
Start: 2020-09-23 — End: 2020-09-23

## 2020-09-23 MED ORDER — DIPHENHYDRAMINE HCL 50 MG/ML IJ SOLN *I*
12.5000 mg | Freq: Once | INTRAMUSCULAR | Status: AC
Start: 2020-09-23 — End: 2020-09-23

## 2020-09-23 MED ORDER — PROCHLORPERAZINE MALEATE 10 MG PO TABS *I*
10.0000 mg | ORAL_TABLET | Freq: Once | ORAL | Status: AC
Start: 2020-09-23 — End: 2020-09-23
  Administered 2020-09-23: 10 mg via ORAL

## 2020-09-23 MED ORDER — DIPHENHYDRAMINE HCL 50 MG/ML IJ SOLN *I*
INTRAMUSCULAR | Status: AC
Start: 2020-09-23 — End: 2020-09-23
  Administered 2020-09-23: 12.5 mg via INTRAVENOUS
  Filled 2020-09-23: qty 1

## 2020-09-23 MED ORDER — SODIUM CHLORIDE 0.9 % INJ (FLUSH) WRAPPED *I*
10.0000 mL | Status: DC | PRN
Start: 2020-09-23 — End: 2020-09-23
  Administered 2020-09-23: 10 mL

## 2020-09-23 MED ORDER — PALONOSETRON HCL 0.25 MG/5ML IV SOLN *I*
0.2500 mg | Freq: Once | INTRAVENOUS | Status: AC
Start: 2020-09-23 — End: 2020-09-23

## 2020-09-23 NOTE — Progress Notes (Signed)
CTSP in infusion for complaint of worsening neuropathy and concern about redness at port site. She is there today for neoadjuvant treatment with paclitaxel/carboplatin.    Per patient report, she has tingling in great toes and tops of feel that is slightly increased since past visit. She also notes worsening finger nail pain that feels like "someone is hammering" her fingers. No numbness or tingling. She on to explain that her nails hurt when she tries to open jars, food packages or a can of soda. She is able to button, zipper, pick up small objects and write without difficulty. Other than as noted she does not feel that this is interfering with her ADL's.     Also asked to look at her port incision which is reddened and has a small open area. Per patient, she has need keeping it covered and applying neosporin ointment. She was prescribed a 7 day course of Keflex 8/26 which she completed. Of NOTE: she saw Dr. Roderic Scarce 9/7 who placed her port and he felt that the area was not infected. She reports that the appearance of the area is significant improved from last week. She has a "mosquito bite" on her chest wall that is itchy and she has scratched it.     She has no fevers, chills and otherwise feel well.     ANC was 1.6 on 9/15.       09/02/2020 Photo                                  09/23/2020 Photo        Assessment/Plan:    She has some neuropathy, however her primary complaint appears to be related to her nails. Recommended she keep her nails short and well groomed to avoid any lifting of the nails. Her foot complaint appears to be confined to the toes and does not limit her ADL's. Explained that the goal of treatment is curative and she is comfortable continuing with taxol at full dose. Recommended that she apply ice to the hands and feet to minimize risk of neuropathy. Also recommended acupuncture. She has an appointment scheduled on next week on Tuesday. Explained that it is also available during  treatments.    OK to use her port today. The area is somewhat red, but there is no warmth or tenderness on palpation. No evidence of active infection. Advised her to discontinue neosporin as she be experiencing sensitivity to neomycin. Advised her to keep the area clean and dry. She is immunocompromised. Also sent prescription to her pharmacy for Bactrim with instructions to take twice daily for 5 days.     -- sulfamethoxazole-trimethoprim (BACTRIM DS,SEPTRA DS) 800-160 mg tablet; Take 1 tablet by mouth every 12 hours for 5 days  for Infection of the Skin and/or Soft Tissue  Dispense: 10 tablet; Refill: 0      Greggory Brandy, PA-C

## 2020-09-23 NOTE — Addendum Note (Signed)
Addended by: Burnard Hawthorne on: 09/23/2020 01:42 PM     Modules accepted: Orders

## 2020-09-23 NOTE — Progress Notes (Signed)
Patient presents for Taxol and Carboplatin infusions. Vitals, assessment, consent, weight/BSA, and labs reviewed. Pt reports worsening neuropathy/nail pain in her bilateral hands and feet, making it difficult to open cans, and she has to be careful while walking not to trip on rugs (also has arthritic hip impacting mobility). She also has a new rash to her left arm, which got worse when she was in the sun this week. Education provided on staying out of the sun and wearing protective clothing. Erythema noted near port site and incision remains open in one area. Greggory Brandy PA assessed patient at chairside. 5-day course of Bactrim was prescribed, OK to use port today. OK to treat with full dose Taxol today.      -IVAD accessed with 20G 3/4 in needle, flushes easily with positive blood return.    -Pre-meds administered: PO Compazine, IV aloxi, decadron, benadryl, and pepcid    -30 min after pre-meds, Paclitaxel was administered over 1 hr, tolerated well    -Pt utilized cryotherapy to bilateral hands and feet for the duration of Paclitaxel infusion.    -Carboplatin administered over 30 min, tolerated well.    -IVAD with positive blood return post-infusions. Flushed with NS then instilled heparin and de-accessed. Site covered with gauze and tape.    -Pt aware to call with any questions or concerns. Pt discharged home in stable condition.    Patient complied with masking policy throughout duration of appointment. Writer maintained use of mask throughout duration of appointment. Writer was within 6 feet of patient for > 15 minutes due to the nature of the treatment.

## 2020-09-27 ENCOUNTER — Ambulatory Visit: Payer: Medicare Other | Admitting: Acupuncturist

## 2020-09-27 NOTE — Progress Notes (Signed)
Acupuncture Treatment Form For Cancer Patient    Date: 09/27/2020  Patient Name: Carly Higgins, DOB: 10/10/1960    Diagnosis: Malignant neoplasm of upper-out quadrant of left breast cancer    Subjective/Chief Complaint: Patient reports     TCM Subjective:  Expression: Calm  Complexion: Pale  Voice: Normal  Inquiring: Thirsty and Cold Drinks  Appetite: Normal  Life Style: Coffee, Tea and Water  Bowels: Normal  Urine: Normal Color  General Body Aching: hips  Pain Characters: Ache  Pain Level: 6  Numbness Level: 7  Mental: Sleep Good and Depression  Menstrual: None  Dizziness: No  Ringing in the Ear:  No     TCM Objective: Patient appears calm  Tongue Body: Pale  Coating: Thin and Dry  Pulse Characters: Left Side: Slow and Weak Right Side: Slow and Weak     Patient past history, current pertinent medical history, lab results, acupuncture procedure, benefits limitation were discussed with patient as needed. They have no further questions at this time.    Observation/Assessment/TCM Diagnosis:  Kidney Qi Deficiency  Blood Stagnancy    Involved Meridians:   K[facility]ney [facility]  Treatment: Provide 60 minutes of treatment which including intake and supportive care, hands on time of 45 minutes of acupuncture.    Position Supine:  []  Scalp []  Forehead []  Face [x]  Neck []  Shoulders []  Arms []  Hands []  Upper legs []  Lower legs []  Feet  Position Prone:  []  Left side-lying []  Right side-lying []  Back []  Neck []  Head []  Upper legs []  Lower legs []  Feet  Position Support:  []  Upper body incline [x]  Head support [x]  Knee support []  Breast bolster []  Shoulder support    Cancer Treatment Points:  1. Improve White Blood Cells and Boost Energy:  ??R 14      []    ??BL 17      []   ??R 6        []   ??R 4          []   ??R 14      []   ??UB 11 []   ??UB 20   []   ??UG 18    []   ??UB 21 []   ??GB 39   []    ??SP 10    [x]   ??P 6        []    ??R 4         [x]   ??R 6      []   ??BL 17    []    ??UB 20     []   ??K 3        [x]    ???ST 36  [x]   ??LR 3        []    ???SP 6    [x]  Head 5 needles    2. Warm Ronny Flurry and Tonic Qi (pale face and fatigue):  ??R 4  []  ??R 14 []   ??DU 4 []  ??UB 43 []   ??R 6  []  ???ST 36 []   ???UB 24 []  UB 52  []   ??DU 20 [x]  ??R 14 []     3. Spleen and Kidney Tonic (nausea or vomiting)  ???ST 36 [x]  ???SP 6 [x]    ??LI 11 []  ??PR 11  []   ????/?????  ??SP 10 []  ??R 6  []   ??R 6  []  ??R 4  []   ??P 6  []   ??UB 17 []   ??P 6  []  ??K 6  []   ??P 7  []  ??SJ  5 [x]   ??SP 18 []  ??GB 43 []   4.Tonic Qi and Ying (posible phase IV)  ???SP 9 [x]  ???GB 34  [x]   ??  ???Sp 9 []   ??K 1  []   ??R 14 []  ??BL 17 []   ??K 3  []  ???ST 36 []         Total Needles 33  Infrared Heat Lamp Applied Yes [x]  No []   Patient response(s) to acupuncture treatment: patient reports positive benefits.     Plan / Recommendations:  Continue comfort acupuncture addressing specific symptoms as indicated.  Patient received 3 complementary acupuncture sessions.   Patient is eligible for 1 additional sessions .    Musical Therapy: Argie Ramming, Mozart, Lilia Pro, Burton Apley Rachmaninoff and Jamas Lav     Patient complied with masking policy throughout duration of appointment. Corinna Gab maintained use of a PPE throughout duration of appointment. Corinna Gab was within 6 feet of patient for less than 5 minutes due to the nature of the treatment.

## 2020-09-29 ENCOUNTER — Other Ambulatory Visit: Payer: Self-pay | Admitting: Oncology

## 2020-09-29 ENCOUNTER — Other Ambulatory Visit
Admission: RE | Admit: 2020-09-29 | Discharge: 2020-09-29 | Disposition: A | Discharge status: Home / Self Care | Payer: Medicare Other | Source: Ambulatory Visit | Attending: Hematology and Oncology | Admitting: Hematology and Oncology

## 2020-09-29 DIAGNOSIS — D471 Chronic myeloproliferative disease: Secondary | ICD-10-CM | POA: Insufficient documentation

## 2020-09-29 DIAGNOSIS — Z171 Estrogen receptor negative status [ER-]: Secondary | ICD-10-CM | POA: Insufficient documentation

## 2020-09-29 DIAGNOSIS — C50412 Malignant neoplasm of upper-outer quadrant of left female breast: Secondary | ICD-10-CM | POA: Insufficient documentation

## 2020-09-29 LAB — CBC AND DIFFERENTIAL
Baso # K/uL: 0 10*3/uL (ref 0.0–0.1)
Basophil %: 0 %
Eos # K/uL: 0.1 10*3/uL (ref 0.0–0.4)
Eosinophil %: 1.7 %
Hematocrit: 29 % — ABNORMAL LOW (ref 34–45)
Hemoglobin: 9.2 g/dL — ABNORMAL LOW (ref 11.2–15.7)
Lymph # K/uL: 1.2 10*3/uL (ref 1.2–3.7)
Lymphocyte %: 33 %
MCH: 33 pg — ABNORMAL HIGH (ref 26–32)
MCHC: 32 g/dL (ref 32–36)
MCV: 103 fL — ABNORMAL HIGH (ref 79–95)
Mono # K/uL: 0.3 10*3/uL (ref 0.2–0.9)
Monocyte %: 9.6 %
Neut # K/uL: 2 10*3/uL (ref 1.6–6.1)
Nucl RBC # K/uL: 0 10*3/uL (ref 0.0–0.0)
Nucl RBC %: 0 /100 WBC (ref 0.0–0.2)
Platelets: 239 10*3/uL (ref 160–370)
RBC: 2.8 MIL/uL — ABNORMAL LOW (ref 3.9–5.2)
RDW: 20.1 % — ABNORMAL HIGH (ref 11.7–14.4)
Seg Neut %: 55.7 %
WBC: 3.5 10*3/uL — ABNORMAL LOW (ref 4.0–10.0)

## 2020-09-29 LAB — COMPREHENSIVE METABOLIC PANEL
ALT: 48 U/L — ABNORMAL HIGH (ref 0–35)
AST: 34 U/L (ref 0–35)
Albumin: 4.1 g/dL (ref 3.5–5.2)
Alk Phos: 90 U/L (ref 35–105)
Anion Gap: 13 (ref 7–16)
Bilirubin,Total: 0.5 mg/dL (ref 0.0–1.2)
CO2: 23 mmol/L (ref 20–28)
Calcium: 9.6 mg/dL (ref 8.6–10.2)
Chloride: 101 mmol/L (ref 96–108)
Creatinine: 0.84 mg/dL (ref 0.51–0.95)
Glucose: 166 mg/dL — ABNORMAL HIGH (ref 60–99)
Lab: 10 mg/dL (ref 6–20)
Potassium: 5 mmol/L (ref 3.3–5.1)
Sodium: 137 mmol/L (ref 133–145)
Total Protein: 6.7 g/dL (ref 6.3–7.7)
eGFR BY CREAT: 79 *

## 2020-09-29 LAB — LACTATE DEHYDROGENASE: LD: 242 U/L — ABNORMAL HIGH (ref 118–225)

## 2020-09-29 LAB — DIFF MANUAL: Diff Based On: 115 CELLS

## 2020-09-29 LAB — NEUTROPHIL #-INSTRUMENT: Neutrophil #-Instrument: 1.5 10*3/uL

## 2020-09-30 ENCOUNTER — Ambulatory Visit: Payer: Medicare Other | Attending: Oncology

## 2020-09-30 ENCOUNTER — Ambulatory Visit: Payer: Medicare Other | Admitting: Acupuncturist

## 2020-09-30 VITALS — BP 114/80 | HR 123 | Temp 96.6°F | Wt 263.4 lb

## 2020-09-30 DIAGNOSIS — Z5111 Encounter for antineoplastic chemotherapy: Secondary | ICD-10-CM | POA: Insufficient documentation

## 2020-09-30 DIAGNOSIS — C50412 Malignant neoplasm of upper-outer quadrant of left female breast: Secondary | ICD-10-CM | POA: Insufficient documentation

## 2020-09-30 DIAGNOSIS — Z171 Estrogen receptor negative status [ER-]: Secondary | ICD-10-CM | POA: Insufficient documentation

## 2020-09-30 DIAGNOSIS — D471 Chronic myeloproliferative disease: Secondary | ICD-10-CM | POA: Insufficient documentation

## 2020-09-30 MED ORDER — SODIUM CHLORIDE 0.9 % IV SOLN WRAPPED *I*
30.0000 mL/h | Status: DC | PRN
Start: 2020-09-30 — End: 2020-09-30
  Administered 2020-09-30: 30 mL/h via INTRAVENOUS
  Administered 2020-09-30: 20 mL/h
  Administered 2020-09-30: 595 mL/h
  Administered 2020-09-30 (×2): 30 mL/h
  Administered 2020-09-30: 150 mL/h
  Administered 2020-09-30: 30 mL/h
  Administered 2020-09-30: 595 mL/h

## 2020-09-30 MED ORDER — FAMOTIDINE (PF) 20 MG/2ML IV SOLN *I*
INTRAVENOUS | Status: AC
Start: 2020-09-30 — End: 2020-09-30
  Administered 2020-09-30: 20 mg via INTRAVENOUS
  Filled 2020-09-30: qty 2

## 2020-09-30 MED ORDER — DEXTROSE 5 % IV SOLN WRAPPED *I*
225.0000 mg | Freq: Once | INTRAVENOUS | Status: AC
Start: 2020-09-30 — End: 2020-09-30
  Administered 2020-09-30: 225 mg via INTRAVENOUS
  Filled 2020-09-30: qty 22.5

## 2020-09-30 MED ORDER — IBUPROFEN 200 MG PO TABS *I*
ORAL_TABLET | ORAL | Status: AC
Start: 2020-09-30 — End: 2020-09-30
  Administered 2020-09-30: 600 mg via ORAL
  Filled 2020-09-30: qty 3

## 2020-09-30 MED ORDER — DEXAMETHASONE SOD PHOSPHATE PF 10 MG/ML IJ SOLN *I*
INTRAMUSCULAR | Status: AC
Start: 2020-09-30 — End: 2020-09-30
  Administered 2020-09-30: 8 mg via INTRAVENOUS
  Filled 2020-09-30: qty 1

## 2020-09-30 MED ORDER — SODIUM CHLORIDE 0.9 % IV BOLUS *I*
500.0000 mL | Freq: Once | Status: AC
Start: 2020-09-30 — End: 2020-09-30
  Administered 2020-09-30: 500 mL via INTRAVENOUS

## 2020-09-30 MED ORDER — PALONOSETRON HCL 0.25 MG/5ML IV SOLN *I*
INTRAVENOUS | Status: AC
Start: 2020-09-30 — End: 2020-09-30
  Administered 2020-09-30: 0.25 mg via INTRAVENOUS
  Filled 2020-09-30: qty 5

## 2020-09-30 MED ORDER — HEPARIN LOCK FLUSH 10 UNIT/ML IJ SOLN WRAPPED *I*
50.0000 [IU] | INTRAVENOUS | Status: DC | PRN
Start: 2020-09-30 — End: 2020-09-30

## 2020-09-30 MED ORDER — FAMOTIDINE (PF) 20 MG/2ML IV SOLN *I*
20.0000 mg | Freq: Once | INTRAVENOUS | Status: AC
Start: 2020-09-30 — End: 2020-09-30

## 2020-09-30 MED ORDER — PROCHLORPERAZINE MALEATE 10 MG PO TABS *I*
10.0000 mg | ORAL_TABLET | Freq: Once | ORAL | Status: AC
Start: 2020-09-30 — End: 2020-09-30
  Administered 2020-09-30: 10 mg via ORAL

## 2020-09-30 MED ORDER — DIPHENHYDRAMINE HCL 50 MG/ML IJ SOLN *I*
INTRAMUSCULAR | Status: AC
Start: 2020-09-30 — End: 2020-09-30
  Administered 2020-09-30: 12.5 mg via INTRAVENOUS
  Filled 2020-09-30: qty 1

## 2020-09-30 MED ORDER — DIPHENHYDRAMINE HCL 50 MG/ML IJ SOLN *I*
12.5000 mg | Freq: Once | INTRAMUSCULAR | Status: AC
Start: 2020-09-30 — End: 2020-09-30

## 2020-09-30 MED ORDER — SODIUM CHLORIDE 0.9 % IV SOLN WRAPPED *I*
80.0000 mg/m2 | Freq: Once | INTRAVENOUS | Status: AC
Start: 2020-09-30 — End: 2020-09-30
  Administered 2020-09-30: 187 mg via INTRAVENOUS
  Filled 2020-09-30: qty 31.17

## 2020-09-30 MED ORDER — DEXAMETHASONE SODIUM PHOSPHATE 10 MG/ML IJ SOLN *I*
8.0000 mg | Freq: Once | INTRAMUSCULAR | Status: AC
Start: 2020-09-30 — End: 2020-09-30

## 2020-09-30 MED ORDER — IBUPROFEN 200 MG PO TABS *I*
600.0000 mg | ORAL_TABLET | Freq: Once | ORAL | Status: AC
Start: 2020-09-30 — End: 2020-09-30

## 2020-09-30 MED ORDER — PALONOSETRON HCL 0.25 MG/5ML IV SOLN *I*
0.2500 mg | Freq: Once | INTRAVENOUS | Status: AC
Start: 2020-09-30 — End: 2020-09-30

## 2020-09-30 NOTE — Progress Notes (Addendum)
Writer saw patient in infusion room today to check on her mediport as well as recent c/o fingernail pain.  Patient states the pain she was feeling in her nails has improved greatly.  Nails are without discoloration or lifting.  Patient will be utilizing ice on fingers and feet today with her treatment.  Mediportr site still with small opening along suture line about 42mm in length.  So significant redness around site.  Writer noted moisture within the opening but no drainage when palpated around the opening.  Patient denies any pain.  Writer instructed patient to keep the area clean & dry.  2x2 's and tegaderm provided. Patient know to call if site changes.

## 2020-09-30 NOTE — Progress Notes (Signed)
Acupuncture Treatment in Infusion room for Cancer Patient    Date: 09/30/2020  Patient Name: Carly Higgins  DOB: 22-Jul-1960    Diagnosis:IDC left,MPN (myeloproliferative neoplasm)    SUBJECTIVE:Left hip pain for a couple of years. Nails pain, toes bother as well. Today patient reports neuropathy feet.    OBJECTIVE:  Shen: Tired and sleepy.    Tongue body:normal  Tongue coating:Dry and White, slight thick  Sublingle Veins:Normal  Pulse Rate:95/ minute  Pulse Quality:fast and slippery    TCM ASSESSMENT:Local stagnation  TCM Treatment Principle:even  Posture: on chair.    Acupuncture Points:SP9(2), SP6(2), ST36 (2), BA FENG (8)  Total Needles:14  Needle Retain:10 minutes  Other modalities: Tui Na  During treatment notes: Good    Recommendation:   Do acupressure for prevention and treatment of NEUROPATHY, please follow the link at home: [email or web address].[email or web address]       Patient provided verbal understanding and consent for acupuncture/acupressure in infusion room for the brief session treatment. They have no further questions at this time.    Patient and/ patient visitor complied with masking policy throughout duration of appointment. Writer maintained use of mask and face shield or goggles throughout duration of appointment. Writer was within 6 feet of patient for >5 minutes due to the nature of the treatment.

## 2020-09-30 NOTE — Progress Notes (Signed)
Patient arrived for Taxol and Carboplatin. Labs and consent noted.  Patient reports some hip and back pain today. Ibuprofen given with good relief. Patient reported neuropathy has slightly improved since last week. Patient will utilize cryotherapy during Taxol infusion and acupuncture. Patient reports not drinking much the past couple of days. 500cc ordered and administered with treatment today.   IVAD accessed, blood return noted. Slight open area to port incision. Team has been following, pt is covering with Tegaderm and gauze.   Pre medications Compazine, aloxi, decadron, benadryl and pepcid given.   Taxol administered over 1 hour. Carboplatin administered over 30 minutes. Patient tolerated well. IVAD de accessed, blood return noted and flushed with NS and heparin. Patient discharged in stable condition.       Patient complied with masking policy throughout the duration of treatment. Written maintained use of mask throughout duration of treatment as well and written was within 6 feet of patient for greater than 15 minutes due to the nature of the treatment.

## 2020-10-04 ENCOUNTER — Ambulatory Visit: Payer: Medicare Other

## 2020-10-05 ENCOUNTER — Other Ambulatory Visit: Payer: Self-pay | Admitting: Hematology and Oncology

## 2020-10-05 ENCOUNTER — Other Ambulatory Visit
Admission: RE | Admit: 2020-10-05 | Discharge: 2020-10-05 | Disposition: A | Discharge status: Home / Self Care | Payer: Medicare Other | Source: Ambulatory Visit | Attending: Hematology and Oncology | Admitting: Hematology and Oncology

## 2020-10-05 DIAGNOSIS — D471 Chronic myeloproliferative disease: Secondary | ICD-10-CM | POA: Insufficient documentation

## 2020-10-05 DIAGNOSIS — Z171 Estrogen receptor negative status [ER-]: Secondary | ICD-10-CM | POA: Insufficient documentation

## 2020-10-05 DIAGNOSIS — C50412 Malignant neoplasm of upper-outer quadrant of left female breast: Secondary | ICD-10-CM | POA: Insufficient documentation

## 2020-10-05 LAB — CBC AND DIFFERENTIAL
Baso # K/uL: 0 10*3/uL (ref 0.0–0.1)
Basophil %: 1 %
Eos # K/uL: 0.1 10*3/uL (ref 0.0–0.4)
Eosinophil %: 2.3 %
Hematocrit: 28 % — ABNORMAL LOW (ref 34–45)
Hemoglobin: 9.4 g/dL — ABNORMAL LOW (ref 11.2–15.7)
IMM Granulocytes #: 0 10*3/uL (ref 0.0–0.0)
IMM Granulocytes: 0.6 %
Lymph # K/uL: 1.5 10*3/uL (ref 1.2–3.7)
Lymphocyte %: 46.9 %
MCH: 34 pg — ABNORMAL HIGH (ref 26–32)
MCHC: 34 g/dL (ref 32–36)
MCV: 100 fL — ABNORMAL HIGH (ref 79–95)
Mono # K/uL: 0.2 10*3/uL (ref 0.2–0.9)
Monocyte %: 4.9 %
Neut # K/uL: 1.4 10*3/uL — ABNORMAL LOW (ref 1.6–6.1)
Nucl RBC # K/uL: 0 10*3/uL (ref 0.0–0.0)
Nucl RBC %: 0 /100 WBC (ref 0.0–0.2)
Platelets: 208 10*3/uL (ref 160–370)
RBC: 2.8 MIL/uL — ABNORMAL LOW (ref 3.9–5.2)
RDW: 19.6 % — ABNORMAL HIGH (ref 11.7–14.4)
Seg Neut %: 44.3 %
WBC: 3.1 10*3/uL — ABNORMAL LOW (ref 4.0–10.0)

## 2020-10-05 LAB — COMPREHENSIVE METABOLIC PANEL
ALT: 50 U/L — ABNORMAL HIGH (ref 0–35)
AST: 33 U/L (ref 0–35)
Albumin: 4.1 g/dL (ref 3.5–5.2)
Alk Phos: 84 U/L (ref 35–105)
Anion Gap: 12 (ref 7–16)
Bilirubin,Total: 0.5 mg/dL (ref 0.0–1.2)
CO2: 21 mmol/L (ref 20–28)
Calcium: 9.1 mg/dL (ref 8.6–10.2)
Chloride: 102 mmol/L (ref 96–108)
Creatinine: 0.57 mg/dL (ref 0.51–0.95)
Glucose: 153 mg/dL — ABNORMAL HIGH (ref 60–99)
Lab: 12 mg/dL (ref 6–20)
Potassium: 4.6 mmol/L (ref 3.3–5.1)
Sodium: 135 mmol/L (ref 133–145)
Total Protein: 6.6 g/dL (ref 6.3–7.7)
eGFR BY CREAT: 104 *

## 2020-10-05 LAB — MULTIPLE ORDERING DOCS

## 2020-10-05 LAB — NEUTROPHIL #-INSTRUMENT: Neutrophil #-Instrument: 1.4 10*3/uL

## 2020-10-05 LAB — LACTATE DEHYDROGENASE: LD: 239 U/L — ABNORMAL HIGH (ref 118–225)

## 2020-10-06 ENCOUNTER — Ambulatory Visit: Payer: Medicare Other | Attending: Hematology and Oncology | Admitting: Hematology and Oncology

## 2020-10-06 ENCOUNTER — Other Ambulatory Visit: Payer: Self-pay

## 2020-10-06 ENCOUNTER — Encounter: Payer: Self-pay | Admitting: Hematology and Oncology

## 2020-10-06 ENCOUNTER — Telehealth: Payer: Self-pay | Admitting: Hematology

## 2020-10-06 ENCOUNTER — Ambulatory Visit: Payer: Medicare Other

## 2020-10-06 VITALS — BP 106/73 | HR 108 | Temp 95.5°F | Wt 265.2 lb

## 2020-10-06 VITALS — BP 106/70 | HR 87 | Temp 97.0°F | Resp 18

## 2020-10-06 DIAGNOSIS — Z5111 Encounter for antineoplastic chemotherapy: Secondary | ICD-10-CM | POA: Insufficient documentation

## 2020-10-06 DIAGNOSIS — Z171 Estrogen receptor negative status [ER-]: Secondary | ICD-10-CM | POA: Insufficient documentation

## 2020-10-06 DIAGNOSIS — C50412 Malignant neoplasm of upper-outer quadrant of left female breast: Secondary | ICD-10-CM

## 2020-10-06 DIAGNOSIS — D471 Chronic myeloproliferative disease: Secondary | ICD-10-CM | POA: Insufficient documentation

## 2020-10-06 DIAGNOSIS — F17210 Nicotine dependence, cigarettes, uncomplicated: Secondary | ICD-10-CM | POA: Insufficient documentation

## 2020-10-06 MED ORDER — HEPARIN LOCK FLUSH 10 UNIT/ML IJ SOLN WRAPPED *I*
50.0000 [IU] | INTRAVENOUS | Status: DC | PRN
Start: 2020-10-06 — End: 2020-10-06
  Administered 2020-10-06: 50 [IU]

## 2020-10-06 MED ORDER — SEQUENCING OF DRUGS FOR ADMINISTRATION *I*
1.0000 | Status: DC | PRN
Start: 2020-10-06 — End: 2020-10-06
  Filled 2020-10-06: qty 1

## 2020-10-06 MED ORDER — PEGFILGRASTIM (NEULASTA ONPRO) 6 MG/0.6ML SC PSKT *I*
6.0000 mg | PREFILLED_SYRINGE | Freq: Once | SUBCUTANEOUS | Status: AC
Start: 2020-10-06 — End: 2020-10-06

## 2020-10-06 MED ORDER — PALONOSETRON HCL 0.25 MG/5ML IV SOLN *I*
INTRAVENOUS | Status: AC
Start: 2020-10-06 — End: 2020-10-06
  Administered 2020-10-06: 0.25 mg via INTRAVENOUS
  Filled 2020-10-06: qty 5

## 2020-10-06 MED ORDER — DEXAMETHASONE 4 MG PO TABS *I*
ORAL_TABLET | ORAL | 0 refills | Status: DC
Start: 2020-10-06 — End: 2021-01-04

## 2020-10-06 MED ORDER — DEXAMETHASONE SODIUM PHOSPHATE 10 MG/ML IJ SOLN *I*
10.0000 mg | Freq: Once | INTRAMUSCULAR | Status: AC
Start: 2020-10-06 — End: 2020-10-06

## 2020-10-06 MED ORDER — SODIUM CHLORIDE 0.9 % IV SOLN WRAPPED *I*
30.0000 mL/h | Status: DC | PRN
Start: 2020-10-06 — End: 2020-10-06
  Administered 2020-10-06: 30 mL/h via INTRAVENOUS

## 2020-10-06 MED ORDER — APREPITANT 130 MG/18ML IV EMUL *I*
INTRAVENOUS | Status: AC
Start: 2020-10-06 — End: 2020-10-06
  Administered 2020-10-06: 130 mg via INTRAVENOUS
  Filled 2020-10-06: qty 18

## 2020-10-06 MED ORDER — PEGFILGRASTIM (NEULASTA ONPRO) 6 MG/0.6ML SC PSKT *I*
PREFILLED_SYRINGE | SUBCUTANEOUS | Status: AC
Start: 2020-10-06 — End: 2020-10-06
  Administered 2020-10-06: 6 mg via SUBCUTANEOUS
  Filled 2020-10-06: qty 0.6

## 2020-10-06 MED ORDER — DOXORUBICIN HCL (ADRIAMYCIN) 2 MG/ML IV SOLN *I*
60.0000 mg/m2 | Freq: Once | INTRAVENOUS | Status: AC
Start: 2020-10-06 — End: 2020-10-06
  Administered 2020-10-06: 140 mg via INTRAVENOUS
  Filled 2020-10-06: qty 70

## 2020-10-06 MED ORDER — DEXTROSE 5 % IV SOLN WRAPPED *I*
600.0000 mg/m2 | Freq: Once | INTRAVENOUS | Status: AC
Start: 2020-10-06 — End: 2020-10-06
  Administered 2020-10-06: 1404 mg via INTRAVENOUS
  Filled 2020-10-06: qty 7.02

## 2020-10-06 MED ORDER — DEXAMETHASONE SOD PHOSPHATE PF 10 MG/ML IJ SOLN *I*
INTRAMUSCULAR | Status: AC
Start: 2020-10-06 — End: 2020-10-06
  Administered 2020-10-06: 10 mg via INTRAVENOUS
  Filled 2020-10-06: qty 1

## 2020-10-06 MED ORDER — SODIUM CHLORIDE 0.9 % INJ (FLUSH) WRAPPED *I*
10.0000 mL | Status: DC | PRN
Start: 2020-10-06 — End: 2020-10-06
  Administered 2020-10-06: 10 mL

## 2020-10-06 MED ORDER — PALONOSETRON HCL 0.25 MG/5ML IV SOLN *I*
0.2500 mg | Freq: Once | INTRAVENOUS | Status: AC
Start: 2020-10-06 — End: 2020-10-06

## 2020-10-06 MED ORDER — APREPITANT 130 MG/18ML IV EMUL *I*
130.0000 mg | Freq: Once | INTRAVENOUS | Status: AC
Start: 2020-10-06 — End: 2020-10-06

## 2020-10-06 NOTE — Progress Notes (Addendum)
[facility] TREATMENT HAND-OFF TOOL:   SITUATION:   Scheduled treatment category for today:     Cancer treatment:  Other    Others:  Patient scheduled for first time [facility]    Is this a new cancer treatment?: Yes      New co-signed height/weight done within last 30 days: Yes    Patient seen by MD    Consent obtained:  Yes    Location:  [facility]    Labs complete:  Yes    Within parameters: Yes      Current patient status:  Scheduled treatment    OK to treat for scheduled treatment:  Yes    Patient is okay for cycle 1 of doxorubicin and cyclophosphamide  treatment today (10/06/2020).  [name], MD

## 2020-10-06 NOTE — Progress Notes (Signed)
Patient presents from clinic for first time [facility] w/ NOBI. Medication specific information discussed with patient such as duration of each chemotherapy agent, side effects such as neutropenia, thrombocytopenia, anemia, mucositis, nausea/vomiting, diarrhea, nail/skin changes, alopecia, bladder irritation, heart problems, fatigue, sinus pressure/headache, and potential for infusion reaction. Hand outs provided. Patient expressed understanding of the information provided. All questions answered to patients satisfaction. 2 bottles of oramagic and instructions for use provided.     -Handoff noted OK to treat. Vitals, assessment, consent, ECHO 07/05/20 EF 65%, weight/BSA, and labs reviewed. All within parameters to treat.    -IVAD accessed, flushes easily with positive blood return. There is a small open area above port site with mild surrounding erythema. Team aware and area was assessed by Dr. Isaiah Serge today.    -Pre-meds administered: Cinvanti, Aloxi and Decadron    -Doxorubicin administered IV push via a free flowing NS line over 15 min total. Pt tolerated well. Pt chewed on ice for duration of injection.    -Cytoxan administered over 30 min, tolerated well.    -IVAD with positive blood return post infusions. Flushed with NS then instilled heparin and de-accessed. Site covered with gauze and tape.    -NOBI applied to left arm. NOBI teaching completed including mechanism of action, indication for use, and side effects including bone pain. Encouraged pt to take Claritin daily for bone pain. Pt states understanding of its function and reasons to call (e.g., site feels wet, light turns red, NOBI falls off). Writer provided pt w instructions including time for removal - NOBI will infuse 27 hours from time of application, and may be removed in 28 hours.     -Pt aware to call with any questions or concerns. Home anti-emetic regimen and instructions for decadron reviewed. Pt to pick up decadron from pharmacy tonight, instructed  her to call us if decadron is not at her pharmacy. Pt already has compazine and zofran at home. Pt discharged home in stable condition.    Patient complied with masking policy throughout duration of appointment. Writer maintained use of mask throughout duration of appointment. Writer was within 6 feet of patient for > 15 minutes due to the nature of the treatment.

## 2020-10-06 NOTE — Patient Instructions (Addendum)
Start cycle 1 of doxorubicin and cyclophosphamide today.    Follow up in 3 weeks for cycle 2 of planned 4 cycles.    It is okay for you to receive steroid injection into your hip, but should be done no later than 4 days after a chemotherapy treatment or between 14 days and 19 days after the treatment.    You should receive flu and COVID-19 vaccines. Best to receive 14-18 days after chemotherapy treatment.    Recommend that you stop smoking. Consider calling for appointment in Smoking Cessation clinic.          September 2022      Sunday Monday Tuesday Wednesday Thursday Friday Saturday                       1    Outpatient Visit   8:25 AM   [facility]    CHEMOTHERAPY   8:30 AM   (150 min.)   Ryan, Caitlin, RN   [facility] 3       4     5     6     7    Outpatient Visit   9:31 AM   [facility]    FOLLOW UP VISIT   1:30 PM   (30 min.)   Dudrak, Kimberly A, PA   [facility]    CHEMOTHERAPY   2:30 PM   (150 min.)   McMahon, Rachel Marie, RN   [facility] 9     10       11     12     13     14     15    Outpatient Visit   8:56 AM   [facility]    CHEMOTHERAPY   8:30 AM   (150 min.)   Godnick, Sarah, RN   [facility] 17       18     19     20    ACUPUNCTURE  10:30 AM   (60 min.)   Fang, Jin, LAC   [facility] 21     22    Outpatient Visit   7:40 AM    [facility] 23    CHEMOTHERAPY   8:30 AM   (150 min.)   Ryan, Caitlin, RN   [facility]    ACUPUNCTURE  11:50 AM   (30 min.)   Li, Aizhong   [facility] 24       25     26     27     28     Outpatient Visit   9:04 AM   [facility] 29    FOLLOW UP VISIT  10:00 AM   (30 min.)   Beverely Pace, MD   [facility]    CHEMOTHERAPY  11:00 AM   (150 min.)   [facility]   S[facility] Davy 06 November 2020      Sunday Monday Tuesday Wednesday Thursday Friday Saturday   1       2     3     4     ACUPUNCTURE  11:00 AM   (60 min.)   Corinna Gab, [facility] 5     6     7     8       9     10     11     12     13     14     15       16     17     18     19     20   PORT________  OV__________  AC___________   21     22       23     24     25     26     27     28     29       30     31                                           November 2022      Sunday Monday Tuesday Wednesday Thursday Friday Saturday             1     2     3     4     5       6     7     8     9     10  PORT________  OV__________  AC___________    FOLLOW UP VISIT   3:30 PM   (30 min.)   Liesveld, Jane, MD   [facility] 11     12       13     14     15     16     17     18     19       20     21     22     23     24     25     26       27     28     29     30

## 2020-10-06 NOTE — Progress Notes (Signed)
ECOG Performance 0-1    ROS:  Appetite normal yes  Weight change yes - down 0.4 kg  Fever   no  Night sweats  no  Fatigue  yes - moderate to severe  Bleeding   no  Cough    no  Dyspnea  yes - Occasional with activity.    Chest pain  no  Palpitations  yes - Occasional  Nausea  Yes - occasional  Antiemetics prn with relief.  Vomiting  no  Dysphagia  no  Mucositis  no  Diarrhea  no  Constipation  yes - mild.  Resolves no its own.    Abdominal pain yes - Intermittent (L) abdominal discomfort.    Urinary symptoms no  Back pain  yes - Occaisonal  Joint pain  yes _ Chronic (L) hip pain.  Patient inquiring if she may receive a cortisone injection.    Bone pain  no  Leg/arm swelling no  Headache  no  Mental status change no  Motor weakness no  Sensory change yes - stable neuropathies in hands & feet.    Gait disturbance no  Rash    Yes - some skin changes on lower forearms.      LMP:  MMG:  GYN:  DEXA:  COLONOSCOPY:

## 2020-10-06 NOTE — Progress Notes (Signed)
[place]     FOLLOW-UP VISIT    PATIENT NAME:  Carly Higgins, Carly Higgins    DOB:  02-07-1960   MRN:  [id number]  10/06/2020     DIAGNOSIS:  Clinical stage IIB left breast cancer    REASON FOR VISIT: Carly Higgins is seen today in anticipation of starting cycle 1 of doxorubicin and cyclophosphamide as neoadjuvant chemotherapy for clinical stage IIB (cT2, cN0, cM0) grade 3, ER negative, PR negative, HER2 negative infiltrating ductal carcinoma of the left breast.  She has now completed 4 cycles of weekly paclitaxel and carboplatin.    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  In February 2022 she began to experience discomfort in the left breast.  She first attributed this to having been struck with a part of her dog when she picked it up about 1 week earlier.  She continued to experience discomfort intermittently.  She was in [place] at the time and did not seek any evaluation.      When she returned to [place] she scheduled a screening mammogram, having not had an exam since July 2018.  This was performed on May 18, 2020 and revealed an area of architectural distortion in the middle third of the left breast upper outer quadrant at the 2 o'clock position.    On Jun 07, 2020 diagnostic mammogram and breast ultrasound was performed.  The mammogram revealed scattered areas of fibroglandular densities with an area of architectural distortion again noted at the 2 o'clock position of the left breast.  Breast ultrasound revealed an irregular mass measuring 19 x 19 x 12 mm in the middle third upper outer quadrant of the left breast.  Internal echotexture was hypoechoic and there was internal vascularity.      Based on these findings, ultrasound-guided core biopsy of the left breast 2:00 lesion was performed on June 09, 2020.  Biopsy revealed invasive ductal carcinoma.  Provisional combined Nottingham  grade was 3 of 3 (tubule formation score 3, nuclear grade 3, mitotic rate score 2).  Tumor cells were negative for estrogen receptors and progesterone receptors.  Tumor cells were negative for HER2 by immunohistochemical staining with a staining score of 1+.  Ki-67 staining was seen in approximately 70% of tumor cells.    She was seen by Dr. Freda Munro for surgical consultation at which time she was noted to have a masslike area at the 2 o'clock position of the left breast which felt like a hematoma.  Staging scans were requested.    She was seen in the [facility]red[facility] Oncology clinic on June 13, 2020. Because of her prior allogeneic bone marrow transplant, she was not eligible for standard germline testing with blood or saliva specimen.      Whole-body bone scan performed on June 20, 2020 revealed no abnormal focus of radiotracer uptake to suggest osseous metastatic disease.    CT of the chest performed with contrast on June 22, 2020 revealed an ill-defined enhancing lesion in the lateral left breast measuring 1.7 x 1.2 x 1.1 cm.  There were asymmetrically prominent level 1 left axillary lymph nodes that demonstrated enhancement of uncertain significance.  These were felt possibly to represent metastatic disease.  There were no enlarged mediastinal, hilar, intramammary or supraclavicular lymph nodes.  No suspicious pulmonary nodules were identified.  CT of the abdomen pelvis performed at the same time revealed no evidence of metastatic disease within  the abdomen and pelvis.  A small fat filled umbilical hernia was noted.    MRI of the breast performed on June 28, 2020 revealed an irregular mass with spiculated margins measuring 10 x 25 x 19 mm in the left breast upper outer quadrant at the 2 o'clock position located 3 cm from the nipple.    Neoadjuvant chemotherapy was recommended and she began cycle 1 of weekly paclitaxel and carboplatin on July 15, 2020.  Immunotherapy with pembrolizumab was not recommended due  to her previous allogenic bone marrow transplant.    On August 18, 2020 she underwent placement of PowerPort in the right internal jugular vein.    INTERVAL HISTORY: **    She reports that she was recently started on tirzepatide for treatment of type 2 diabetes mellitus.    She reports that she has continued to tolerate the treatment well.  She is no longer experiencing pain in the breast and feels that the mass has decreased in size.  Appetite has been good.  She denies fever or symptoms of infection.  She reports feeling fatigue a few days after each treatment.  She denies mouth sores.  She has noted some mild exertional dyspnea which is unchanged from her recent baseline.  She denies cough or other respiratory symptoms.  She has no cardiac symptoms including exertional chest pain palpitations.  She has noted nausea on occasion for which she has taken ondansetron and prochlorperazine.  She has not vomited.  She reports occasional constipation.  She has no other gastrointestinal symptoms including abdominal pain and bloating.  She has no urinary symptoms.  She continues to have left hip pain on occasion.  She has no other musculoskeletal symptoms including back pain or bone pain.  She denies swelling of her extremities.  She denies headaches.  She reports no cognitive changes.  She has no focal motor symptoms.  She has started to note some mild numbness in the first toe of each foot.  She denies numbness in her hands.  Gait remains unsteady due to her hip pain.  She has no dermatologic symptoms.    She reports that she has been smoking several cigarettes a day.    PAST MEDICAL/SURGICAL HISTORY:   1. 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  Prior to bone marrow transplant she received 5 cycles of decitabine.  She received fludarabine, busulfan and and hydroxyurea for the transplant and  received methotrexate for graft-versus-host prophylaxis.  2. Hypothyroid.  3. Anxiety.  4. Osteoarthritis of the hips, left greater than right.  5. Mild diverticulosis of the sigmoid colon.  6. History of atrial fibrillation and congestive heart failure in 2017 related to the bone marrow transplant.  7. Right cataract surgery.  8. Right rotator cuff surgery.    MEDICATIONS:     Current Outpatient Medications   Medication Sig    acyclovir (ZOVIRAX) 400 mg tablet Take 400 mg by mouth 2 times daily    ondansetron (ZOFRAN) 8 mg tablet Take 1 tablet (8 mg total) by mouth every 8 hours as needed  Begin 72 hours after treatment if needed for nausea    prochlorperazine (COMPAZINE) 10 mg tablet Take 1 tablet (10 mg total) by mouth every 6 hours as needed (nausea)    cholecalciferol (VITAMIN D) 50 MCG (2000 UT) tablet Take 1,000 Units by mouth daily    levothyroxine (SYNTHROID, LEVOTHROID) 50 MCG tablet Take  50 mcg by mouth daily (before breakfast)    escitalopram (LEXAPRO) 20 MG tablet Take 20 mg by mouth daily    aspirin 81 MG EC tablet Take 81 mg by mouth daily    metoprolol tartrate (LOPRESSOR) 50 mg tablet Take 1 tablet (50 mg total) by mouth 2 times daily (Patient not taking: Reported on 10/06/2020)    ondansetron (ZOFRAN) 4 mg tablet Take 1 tablet (4 mg total) by mouth 3 times daily as needed (nausea)       ROS: As outlined above and in the nursing assessment. Comprehensive review of systems is otherwise negative.          Vitals:    10/06/20 0955   BP: 106/73   Pulse: 108   Temp: 35.3 C (95.5 F)   TempSrc: Temporal   SpO2: 97%   Weight: 120.3 kg (265 lb 3.4 oz)         PHYSICAL EXAMINATION:     General: Reveals a well-appearing woman. ECOG performance status is 1.  Weight is down 0.4 kg from last visit. HEENT exam: grossly unremarkable.  Sclerae are anicteric. Conjunctivae are pink.  Oral mucosa is without stomatitis or thrush. Lymph nodes: There is no palpable cervical, supraclavicular, axillary or inguinal  lymph node enlargement. Lungs: clear to auscultation and percussion.  Cardiac exam:  reveals a regular rate and rhythm. No murmur is noted.  Chest: PowerPort is present in the upper chest on the right.  Wounds are healing.  Breasts: Left breast has a palpable mass in the upper outer quadrant measuring approximately 2.5 cm.  This appears to be smaller than on prior exams.  Overlying skin is unremarkable.  Abdomen: is soft.  Bowel sounds are normoactive.  There is no hepatosplenomegaly, mass or ascites detected by palpation and percussion..  Extremities: are without edema.  Neurologic exam: is grossly nonfocal. Skin: is without rash, petechiae or ecchymoses.  Again noted are patches of vitiligo in the upper abdomen.    LABORATORY DATA:     Recent Results (from the past 72 hour(s))   Multiple ordering docs    Collection Time: 10/05/20  9:06 AM   Result Value Ref Range    Mult Providers see below    Comprehensive metabolic panel    Collection Time: 10/05/20  9:14 AM   Result Value Ref Range    Sodium 135 133 - 145 mmol/L    Potassium 4.6 3.3 - 5.1 mmol/L    Chloride 102 96 - 108 mmol/L    CO2 21 20 - 28 mmol/L    Anion Gap 12 7 - 16    UN 12 6 - 20 mg/dL    Creatinine 0.57 0.51 - 0.95 mg/dL    eGFR BY CREAT 104 *    Glucose 153 (H) 60 - 99 mg/dL    Calcium 9.1 8.6 - 10.2 mg/dL    Total Protein 6.6 6.3 - 7.7 g/dL    Albumin 4.1 3.5 - 5.2 g/dL    Bilirubin,Total 0.5 0.0 - 1.2 mg/dL    AST 33 0 - 35 U/L    ALT 50 (H) 0 - 35 U/L    Alk Phos 84 35 - 105 U/L   CBC and differential    Collection Time: 10/05/20  9:14 AM   Result Value Ref Range    WBC 3.1 (L) 4.0 - 10.0 THOU/uL    RBC 2.8 (L) 3.9 - 5.2 MIL/uL    Hemoglobin 9.4 (L) 11.2 - 15.7 g/dL  Hematocrit 28 (L) 34 - 45 %    MCV 100 (H) 79 - 95 fL    MCH 34 (H) 26 - 32 pg    MCHC 34 32 - 36 g/dL    RDW 19.6 (H) 11.7 - 14.4 %    Platelets 208 160 - 370 THOU/uL    Seg Neut % 44.3 %    Lymphocyte % 46.9 %    Monocyte % 4.9 %    Eosinophil % 2.3 %    Basophil % 1.0 %    Neut  # K/uL 1.4 (L) 1.6 - 6.1 THOU/uL    Lymph # K/uL 1.5 1.2 - 3.7 THOU/uL    Mono # K/uL 0.2 0.2 - 0.9 THOU/uL    Eos # K/uL 0.1 0.0 - 0.4 THOU/uL    Baso # K/uL 0.0 0.0 - 0.1 THOU/uL    Nucl RBC % 0.0 0.0 - 0.2 /100 WBC    Nucl RBC # K/uL 0.0 0.0 - 0.0 THOU/uL    IMM Granulocytes # 0.0 0.0 - 0.0 THOU/uL    IMM Granulocytes 0.6 %   Lactate dehydrogenase    Collection Time: 10/05/20  9:14 AM   Result Value Ref Range    LD 239 (H) 118 - 225 U/L   Neutrophil #-Instrument    Collection Time: 10/05/20  9:14 AM   Result Value Ref Range    Neutrophil #-Instrument 1.4 THOU/uL           Lab results: 10/05/20  0914 09/29/20  0744 09/22/20  0913 09/14/20  0942 09/08/20  0834 09/01/20  0820   WBC 3.1* 3.5* 3.4* 3.7* 3.6* 3.6*   Neut # K/uL 1.4* 2.0 1.6 1.7 1.6 2.0   Lymph # K/uL 1.5 1.2 1.4 1.7 1.7 1.5   Hematocrit 28* 29* 28* 31* 30* 32*   Hemoglobin 9.4* 9.2* 9.1* 10.4* 9.9* 10.6*   MCV 100* 103* 100* 98* 99* 96*   Platelets 208 239 240 195 198 179         IMAGING DATA: No new imaging data    ASSESSMENT: Ms. Simien has continued to tolerate chemotherapy well.  She has noted some mild numbness in the first toe each foot on occasion.  This is not a contraindication to continuing with treatment but we will need to monitor closely for further progression of symptoms.  Her breast tumor appears to be responding to the chemotherapy.  I have recommended that she continue with treatment.  She is agreeable and will start cycle 1 of doxorubicin and doxorubicin today.  She will return for follow-up in 3 weeks anticipation of starting cycle 2 of planned 4 cycles.    She continues to smoke cigarettes.  I have recommended that she try if possible to stop smoking.     PLAN:     Clinical stage IIB triple negative left breast cancer     Start cycle 1 of doxorubicin and cyclophosphamide. This will consist of doxorubicin 60 mg/m and cyclophosphamide 600 mg/m on day 1 of each 21-day cycle.  Pegfilgrastim will be administered on day  2.     Continue to hold on immunotherapy with pembrolizumab in light of her previous allogenic bone marrow transplant.     Surgical resection after completion of neoadjuvant chemotherapy.    COVID-19     Patient has received the [place] COVID-19 vaccine, receiving the first dose on May 15, 2019, second dose on Jun 05, 2019 and booster dose on October 08, 2019.  She is eligible for a second booster dose.    I have recommend that she receive this 14 to 18 days after the chemotherapy treatment.     Patient advised to continue to take precautions to reduce risk of contracting COVID-19, including social distancing, frequent handwashing and using a mask when in public.    Health maintenance     Patient encouraged to stop smoking cigarettes entirely.  I have encouraged her to schedule an appointment with the smoking cessation clinic.     Recommend patient receive flu vaccine in the next few months.   I have recommend that she receive this 14 to 18 days after the chemotherapy treatment.       Follow-up     Return for follow-up in 3 weeks.    Due to concerns regarding COVID-19, a facemask was worn by myself as well as the patient throughout the office visit, except lately to allow for inspection of the oral cavity.      Carly Craze Isaiah Serge, MD    PG:[id number] Fredda Hammed, MD  Minus Liberty, MD  Assunta Curtis, MD

## 2020-10-11 ENCOUNTER — Ambulatory Visit: Payer: Medicare Other

## 2020-10-12 ENCOUNTER — Encounter: Payer: Self-pay | Admitting: Hematology and Oncology

## 2020-10-17 ENCOUNTER — Other Ambulatory Visit: Payer: Self-pay

## 2020-10-17 DIAGNOSIS — Z9481 Bone marrow transplant status: Secondary | ICD-10-CM

## 2020-10-17 MED ORDER — METOPROLOL TARTRATE 50 MG PO TABS *I*
50.0000 mg | ORAL_TABLET | Freq: Two times a day (BID) | ORAL | 3 refills | Status: DC
Start: 2020-10-17 — End: 2021-06-15

## 2020-10-17 MED ORDER — ACYCLOVIR 400 MG PO TABS *I*
400.0000 mg | ORAL_TABLET | Freq: Two times a day (BID) | ORAL | 3 refills | Status: DC
Start: 2020-10-17 — End: 2021-09-21

## 2020-10-26 ENCOUNTER — Other Ambulatory Visit: Payer: Self-pay | Admitting: Oncology

## 2020-10-26 ENCOUNTER — Other Ambulatory Visit
Admission: RE | Admit: 2020-10-26 | Discharge: 2020-10-26 | Disposition: A | Discharge status: Home / Self Care | Payer: Medicare Other | Source: Ambulatory Visit | Attending: Hematology and Oncology | Admitting: Hematology and Oncology

## 2020-10-26 DIAGNOSIS — Z171 Estrogen receptor negative status [ER-]: Secondary | ICD-10-CM | POA: Insufficient documentation

## 2020-10-26 DIAGNOSIS — D471 Chronic myeloproliferative disease: Secondary | ICD-10-CM | POA: Insufficient documentation

## 2020-10-26 DIAGNOSIS — C50412 Malignant neoplasm of upper-outer quadrant of left female breast: Secondary | ICD-10-CM | POA: Insufficient documentation

## 2020-10-26 LAB — COMPREHENSIVE METABOLIC PANEL
ALT: 47 U/L — ABNORMAL HIGH (ref 0–35)
AST: 32 U/L (ref 0–35)
Albumin: 4.2 g/dL (ref 3.5–5.2)
Alk Phos: 108 U/L — ABNORMAL HIGH (ref 35–105)
Anion Gap: 13 (ref 7–16)
Bilirubin,Total: 0.4 mg/dL (ref 0.0–1.2)
CO2: 24 mmol/L (ref 20–28)
Calcium: 10.1 mg/dL (ref 8.6–10.2)
Chloride: 101 mmol/L (ref 96–108)
Creatinine: 0.83 mg/dL (ref 0.51–0.95)
Glucose: 132 mg/dL — ABNORMAL HIGH (ref 60–99)
Lab: 15 mg/dL (ref 6–20)
Potassium: 5.2 mmol/L — ABNORMAL HIGH (ref 3.3–5.1)
Sodium: 138 mmol/L (ref 133–145)
Total Protein: 6.8 g/dL (ref 6.3–7.7)
eGFR BY CREAT: 80 *

## 2020-10-26 LAB — CBC AND DIFFERENTIAL
Baso # K/uL: 0.1 10*3/uL (ref 0.0–0.1)
Basophil %: 1.6 %
Eos # K/uL: 0 10*3/uL (ref 0.0–0.4)
Eosinophil %: 0.5 %
Hematocrit: 28 % — ABNORMAL LOW (ref 34–45)
Hemoglobin: 8.7 g/dL — ABNORMAL LOW (ref 11.2–15.7)
IMM Granulocytes #: 0.1 10*3/uL — ABNORMAL HIGH (ref 0.0–0.0)
IMM Granulocytes: 1.9 %
Lymph # K/uL: 1.4 10*3/uL (ref 1.2–3.7)
Lymphocyte %: 23.3 %
MCH: 33 pg — ABNORMAL HIGH (ref 26–32)
MCHC: 32 g/dL (ref 32–36)
MCV: 105 fL — ABNORMAL HIGH (ref 79–95)
Mono # K/uL: 1 10*3/uL — ABNORMAL HIGH (ref 0.2–0.9)
Monocyte %: 16 %
Neut # K/uL: 3.5 10*3/uL (ref 1.6–6.1)
Nucl RBC # K/uL: 0 10*3/uL (ref 0.0–0.0)
Nucl RBC %: 0 /100 WBC (ref 0.0–0.2)
Platelets: 303 10*3/uL (ref 160–370)
RBC: 2.6 MIL/uL — ABNORMAL LOW (ref 3.9–5.2)
RDW: 19.9 % — ABNORMAL HIGH (ref 11.7–14.4)
Seg Neut %: 56.7 %
WBC: 6.2 10*3/uL (ref 4.0–10.0)

## 2020-10-26 LAB — MULTIPLE ORDERING DOCS

## 2020-10-26 LAB — LACTATE DEHYDROGENASE: LD: 243 U/L — ABNORMAL HIGH (ref 118–225)

## 2020-10-26 LAB — NEUTROPHIL #-INSTRUMENT: Neutrophil #-Instrument: 3.5 10*3/uL

## 2020-10-27 ENCOUNTER — Ambulatory Visit: Payer: Medicare Other

## 2020-10-27 ENCOUNTER — Other Ambulatory Visit: Payer: Self-pay

## 2020-10-27 ENCOUNTER — Ambulatory Visit: Payer: Medicare Other | Attending: Oncology | Admitting: Oncology

## 2020-10-27 VITALS — BP 119/70 | HR 88 | Temp 96.6°F | Wt 258.8 lb

## 2020-10-27 DIAGNOSIS — F17210 Nicotine dependence, cigarettes, uncomplicated: Secondary | ICD-10-CM | POA: Insufficient documentation

## 2020-10-27 DIAGNOSIS — Z9481 Bone marrow transplant status: Secondary | ICD-10-CM

## 2020-10-27 DIAGNOSIS — Z171 Estrogen receptor negative status [ER-]: Secondary | ICD-10-CM

## 2020-10-27 DIAGNOSIS — M898X9 Other specified disorders of bone, unspecified site: Secondary | ICD-10-CM

## 2020-10-27 DIAGNOSIS — G629 Polyneuropathy, unspecified: Secondary | ICD-10-CM

## 2020-10-27 DIAGNOSIS — D471 Chronic myeloproliferative disease: Secondary | ICD-10-CM

## 2020-10-27 DIAGNOSIS — Z5111 Encounter for antineoplastic chemotherapy: Secondary | ICD-10-CM | POA: Insufficient documentation

## 2020-10-27 DIAGNOSIS — Z01818 Encounter for other preprocedural examination: Secondary | ICD-10-CM

## 2020-10-27 DIAGNOSIS — M25552 Pain in left hip: Secondary | ICD-10-CM

## 2020-10-27 DIAGNOSIS — C50412 Malignant neoplasm of upper-outer quadrant of left female breast: Secondary | ICD-10-CM | POA: Insufficient documentation

## 2020-10-27 DIAGNOSIS — D849 Immunodeficiency, unspecified: Secondary | ICD-10-CM

## 2020-10-27 DIAGNOSIS — F17200 Nicotine dependence, unspecified, uncomplicated: Secondary | ICD-10-CM

## 2020-10-27 MED ORDER — SODIUM CHLORIDE 0.9 % INJ (FLUSH) WRAPPED *I*
10.0000 mL | Status: DC | PRN
Start: 2020-10-27 — End: 2020-10-27
  Administered 2020-10-27: 10 mL

## 2020-10-27 MED ORDER — SODIUM CHLORIDE 0.9 % IV SOLN WRAPPED *I*
30.0000 mL/h | Status: DC | PRN
Start: 2020-10-27 — End: 2020-10-27
  Administered 2020-10-27: 30 mL/h
  Administered 2020-10-27: 30 mL/h via INTRAVENOUS
  Administered 2020-10-27: 100 mL/h

## 2020-10-27 MED ORDER — DEXTROSE 5 % IV SOLN WRAPPED *I*
600.0000 mg/m2 | Freq: Once | INTRAVENOUS | Status: AC
Start: 2020-10-27 — End: 2020-10-27
  Administered 2020-10-27: 1404 mg via INTRAVENOUS
  Filled 2020-10-27: qty 7.02

## 2020-10-27 MED ORDER — APREPITANT 130 MG/18ML IV EMUL *I*
130.0000 mg | Freq: Once | INTRAVENOUS | Status: AC
Start: 2020-10-27 — End: 2020-10-27
  Administered 2020-10-27: 130 mg via INTRAVENOUS

## 2020-10-27 MED ORDER — DEXAMETHASONE SODIUM PHOSPHATE 10 MG/ML IJ SOLN *I*
10.0000 mg | Freq: Once | INTRAMUSCULAR | Status: AC
Start: 2020-10-27 — End: 2020-10-27
  Administered 2020-10-27: 10 mg via INTRAVENOUS

## 2020-10-27 MED ORDER — APREPITANT 130 MG/18ML IV EMUL *I*
INTRAVENOUS | Status: AC
Start: 2020-10-27 — End: 2020-10-28
  Filled 2020-10-27: qty 18

## 2020-10-27 MED ORDER — DEXAMETHASONE SOD PHOSPHATE PF 10 MG/ML IJ SOLN *I*
INTRAMUSCULAR | Status: AC
Start: 2020-10-27 — End: 2020-10-28
  Filled 2020-10-27: qty 1

## 2020-10-27 MED ORDER — PEGFILGRASTIM (NEULASTA ONPRO) 6 MG/0.6ML SC PSKT *I*
6.0000 mg | PREFILLED_SYRINGE | Freq: Once | SUBCUTANEOUS | Status: AC
Start: 2020-10-27 — End: 2020-10-27
  Administered 2020-10-27: 6 mg via SUBCUTANEOUS

## 2020-10-27 MED ORDER — DOXORUBICIN HCL (ADRIAMYCIN) 2 MG/ML IV SOLN *I*
60.0000 mg/m2 | Freq: Once | INTRAVENOUS | Status: AC
Start: 2020-10-27 — End: 2020-10-27
  Administered 2020-10-27: 140 mg via INTRAVENOUS
  Filled 2020-10-27: qty 70

## 2020-10-27 MED ORDER — HEPARIN LOCK FLUSH 10 UNIT/ML IJ SOLN WRAPPED *I*
50.0000 [IU] | INTRAVENOUS | Status: DC | PRN
Start: 2020-10-27 — End: 2020-10-27
  Administered 2020-10-27: 50 [IU]

## 2020-10-27 MED ORDER — PALONOSETRON HCL 0.25 MG/5ML IV SOLN *I*
0.2500 mg | Freq: Once | INTRAVENOUS | Status: AC
Start: 2020-10-27 — End: 2020-10-27
  Administered 2020-10-27: 0.25 mg via INTRAVENOUS

## 2020-10-27 NOTE — Progress Notes (Signed)
[facility]     FOLLOW-UP VISIT    PATIENT NAME:  Carly Higgins, Carly Higgins    DOB:  1960-08-14   MRN:  [id number]  10/27/2020     DIAGNOSIS:  Clinical stage IIB left breast cancer    REASON FOR VISIT: Carly Higgins is seen today in anticipation of starting cycle 2 of doxorubicin and cyclophosphamide as neoadjuvant chemotherapy for clinical stage IIB (cT2, cN0, cM0) grade 3, ER negative, PR negative, HER2 negative infiltrating ductal carcinoma of the left breast.  She has already completed 4 cycles of weekly paclitaxel and carboplatin.    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  In February 2022 she began to experience discomfort in the left breast.  She first attributed this to having been struck with a part of her dog when she picked it up about 1 week earlier.  She continued to experience discomfort intermittently.  She was in [place] at the time and did not seek any evaluation.      When she returned to [place] she scheduled a screening mammogram, having not had an exam since July 2018.  This was performed on May 18, 2020 and revealed an area of architectural distortion in the middle third of the left breast upper outer quadrant at the 2 o'clock position.    On Jun 07, 2020 diagnostic mammogram and breast ultrasound was performed.  The mammogram revealed scattered areas of fibroglandular densities with an area of architectural distortion again noted at the 2 o'clock position of the left breast.  Breast ultrasound revealed an irregular mass measuring 19 x 19 x 12 mm in the middle third upper outer quadrant of the left breast.  Internal echotexture was hypoechoic and there was internal vascularity.      Based on these findings, ultrasound-guided core biopsy of the left breast 2:00 lesion was performed on June 09, 2020.  Biopsy revealed invasive ductal carcinoma.  Provisional combined  Nottingham grade was 3 of 3 (tubule formation score 3, nuclear grade 3, mitotic rate score 2).  Tumor cells were negative for estrogen receptors and progesterone receptors.  Tumor cells were negative for HER2 by immunohistochemical staining with a staining score of 1+.  Ki-67 staining was seen in approximately 70% of tumor cells.    She was seen by Dr. Freda Munro for surgical consultation at which time she was noted to have a masslike area at the 2 o'clock position of the left breast which felt like a hematoma.  Staging scans were requested.    She was seen in the [facility] clinic on June 13, 2020. Because of her prior allogeneic bone marrow transplant, she was not eligible for standard germline testing with blood or saliva specimen.      Whole-body bone scan performed on June 20, 2020 revealed no abnormal focus of radiotracer uptake to suggest osseous metastatic disease.    CT of the chest performed with contrast on June 22, 2020 revealed an ill-defined enhancing lesion in the lateral left breast measuring 1.7 x 1.2 x 1.1 cm.  There were asymmetrically prominent level 1 left axillary lymph nodes that demonstrated enhancement of uncertain significance.  These were felt possibly to represent metastatic disease.  There were no enlarged mediastinal, hilar, intramammary or supraclavicular lymph nodes.  No suspicious pulmonary nodules were identified.  CT of the abdomen pelvis performed at the same time revealed no evidence of metastatic disease within  the abdomen and pelvis.  A small fat filled umbilical hernia was noted.    MRI of the breast performed on June 28, 2020 revealed an irregular mass with spiculated margins measuring 10 x 25 x 19 mm in the left breast upper outer quadrant at the 2 o'clock position located 3 cm from the nipple.    Neoadjuvant chemotherapy was recommended and she began cycle 1 of weekly paclitaxel and carboplatin on July 15, 2020.  Immunotherapy with pembrolizumab was not  recommended due to her previous allogenic bone marrow transplant.    On August 18, 2020 she underwent placement of PowerPort in the right internal jugular vein.    Final cycle (4) of paclitaxel and carboplatin was administered on September 30, 2020.    INTERVAL HISTORY:   Today is my first time meeting Carly Higgins.  She is accompanied by her mother today.  Since her visit with Dr. Isaiah Serge on October 06, 2020, when she also received cycle 1 dose dense cyclophosphamide and doxorubicin (with neulasta on day 2), she reports:    She did ok with the infusion and the on body injector.  She continues to feel very tired. This has slowly worsened with each treatment.  She lays down most of the day.  Her legs feel weak when she is up.  She is winded easily; although this has been better recently.  She denies cough or other respiratory symptoms.  She has history of left hip with occasional sciatic pain which acted up last week; she reports using ibuprofen and aleve for this with some relief.  It is getting better. Gait remains unsteady due to her baseline hip pain.   She also noticed, around the same time, ache in her lower jaw when biting.  She had not tried taking loratadine for a few days; was not aware (with [facility]).  She reports that she has numbness and tingling in the balls and toes of her feet bilaterally.  This started with taxol; initially involving only the great toe of each foot.  Overall, she thinks there is slight improvement since last visit.    She is no longer experiencing pain in the left  breast and feels that the mass has decreased in size.  Appetite has been good.  She noticed oral sensitivity for a couple days last week.  She denies nausea which she attributes to taking compazine (each AM) on days 1 and 2 and ondansetron daily thereafter.  She has not vomited.  She reports occasional constipation.  She has no other gastrointestinal symptoms including abdominal pain and bloating.     She denies fever or symptoms  of infection.    She has no cardiac symptoms including exertional chest pain palpitations.   She has no urinary symptoms.  Aside from left hip pain as discussed above, she has no other musculoskeletal symptoms including back pain or bone pain.  She denies swelling of her extremities.  She denies headaches.  She reports no cognitive changes.  She has no focal motor symptoms.  She has no dermatologic symptoms.    She reports that she has continued to smoke several cigarettes a day and "will be quitting".    ROS: As outlined above: comprehensive review of systems is otherwise negative.     Immunization History   Administered Date(s) Administered    Covid-19 mRNA vaccine (PFIZER) IM 30 mcg/0.5m 05/15/2019, 06/05/2019, 10/08/2019    DTaP(Infanrix) 08/01/2016, 09/25/2016, 11/22/2016    Hepatitis B Adult 08/01/2016, 09/25/2016, 02/21/2017, 08/15/2017  HiB(Acthib/Hiberix) 08/01/2016, 09/25/2016, 11/22/2016    IPV 08/01/2016, 09/25/2016, 11/22/2016    Influenza Historical [id number](IEP FLU) 11/14/2017    Influenza Quadrivalent 0.84m prefilled syringe/single dose vial(FluLaval,Fluzone,Afluria,Fluarix) 12/15/2015, 09/25/2016    Meningococcal Conjugate MCV4O (Menveo) 08/01/2016    Pneumococcal 13-valent Conjugate (Prevnar 13) 08/01/2016, 09/25/2016, 11/22/2016    Pneumococcal Polysaccharide (Pneumovax) 02/21/2017, 08/15/2017    Zoster(Shingrix) 02/21/2017, 08/15/2017     She reports that she received annual flu vaccine and COVID updated booster on October 25, 2020.    PAST MEDICAL/SURGICAL HISTORY:   1. 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  Prior to bone marrow transplant she received 5 cycles of decitabine.  She received fludarabine, busulfan and and hydroxyurea for the transplant and received methotrexate for graft-versus-host  prophylaxis.  2. Hypothyroid.  3. Anxiety.  4. Osteoarthritis of the hips, left greater than right.  5. Mild diverticulosis of the sigmoid colon.  6. History of atrial fibrillation and congestive heart failure in 2017 related to the bone marrow transplant.  7. Right cataract surgery.  8. Right rotator cuff surgery.    MEDICATIONS:     Current Outpatient Medications   Medication Sig    acyclovir (ZOVIRAX) 400 mg tablet Take 1 tablet (400 mg total) by mouth 2 times daily    metoprolol tartrate (LOPRESSOR) 50 mg tablet Take 1 tablet (50 mg total) by mouth 2 times daily    dexAMETHasone (DECADRON) 4 MG tablet Take 2 tablets (8 mg) by mouth once daily with food on days 2,3 and 4 of each chemotherapy cycle.    ondansetron (ZOFRAN) 8 mg tablet Take 1 tablet (8 mg total) by mouth every 8 hours as needed  Begin 72 hours after treatment if needed for nausea    prochlorperazine (COMPAZINE) 10 mg tablet Take 1 tablet (10 mg total) by mouth every 6 hours as needed (nausea)    ondansetron (ZOFRAN) 4 mg tablet Take 1 tablet (4 mg total) by mouth 3 times daily as needed (nausea)    cholecalciferol (VITAMIN D) 50 MCG (2000 UT) tablet Take 1,000 Units by mouth daily    levothyroxine (SYNTHROID, LEVOTHROID) 50 MCG tablet Take 50 mcg by mouth daily (before breakfast)    escitalopram (LEXAPRO) 20 MG tablet Take 20 mg by mouth daily    aspirin 81 MG EC tablet Take 81 mg by mouth daily     OBJECTIVE FINDINGS:    Vitals:    10/27/20 1150   BP: 119/70   Pulse: 88   Temp: 35.9 C (96.6 F)   TempSrc: Temporal   SpO2: 96%   Weight: 117.4 kg (258 lb 13.1 oz); down 2.9 kg since last visit     Wt Readings from Last 8 Encounters:   10/27/20 117.4 kg (258 lb 13.1 oz)   10/06/20 120.3 kg (265 lb 3.4 oz)   09/30/20 119.5 kg (263 lb 7.2 oz)   09/23/20 119.5 kg (263 lb 7.2 oz)   09/15/20 120.2 kg (264 lb 15.9 oz)   09/09/20 119.9 kg (264 lb 5.3 oz)   09/02/20 120.4 kg (265 lb 6.9 oz)   08/26/20 120.7 kg (266 lb 1.5 oz)     Pain    10/27/20  1150   PainSc:   0 - No pain     PHYSICAL EXAMINATION:     General: Reveals a well-appearing woman. ECOG performance status is 1.  HEENT exam: grossly unremarkable.  Sclerae are  anicteric. Conjunctivae are pink.  Oral mucosa is without stomatitis or thrush. Lymph nodes: There is no palpable cervical, supraclavicular, axillary or inguinal lymph node enlargement. Lungs: clear to auscultation and percussion.  Cardiac exam:  reveals a regular rate and rhythm. No murmur is noted.  Chest: PowerPort is present in the upper chest on the right.  Wounds are healing; small scabbed area remains near top border of port.  Breasts: Left breast has a palpable mass in the upper outer quadrant measuring approximately 2.0 cm.  This appears to be less distinct and smaller than as described on prior exams.  Overlying skin is unremarkable.  Abdomen: is soft.  Bowel sounds are normoactive.  There is no hepatosplenomegaly, mass or ascites detected by palpation and percussion..  Extremities: are without edema.  Neurologic exam: is grossly nonfocal. Skin: is without rash, petechiae or ecchymoses.  Again noted are patches of vitiligo in the upper abdomen.    LABORATORY DATA:     Lab results: 10/26/20  0740 10/05/20  0914 09/29/20  0744 09/22/20  0913   WBC 6.2 3.1* 3.5* 3.4*   Hemoglobin 8.7* 9.4* 9.2* 9.1*   Hematocrit 28* 28* 29* 28*   RBC 2.6* 2.8* 2.8* 2.8*   Platelets 303 208 239 240   Neut # K/uL 3.5 1.4* 2.0 1.6   Lymph # K/uL 1.4 1.5 1.2 1.4   Mono # K/uL 1.0* 0.2 0.3 0.1*   Eos # K/uL 0.0 0.1 0.1 0.2   Baso # K/uL 0.1 0.0 0.0 0.0   Seg Neut % 56.7 44.3 55.7 47.3   Lymphocyte % 23.3 46.9 33.0 42.1   Monocyte % 16.0 4.9 9.6 3.5   Eosinophil % 0.5 2.3 1.7 4.4   Basophil % 1.6 1.0 0.0 0.0   Bands %  --   --   --  1   Myelocyte %  --   --   --  2*         Lab results: 10/26/20  0740 10/05/20  0914 09/29/20  0744 09/22/20  0913 09/14/20  0942 09/08/20  0834   WBC 6.2 3.1* 3.5* 3.4* 3.7* 3.6*   Neut # K/uL 3.5 1.4* 2.0 1.6 1.7 1.6   Lymph #  K/uL 1.4 1.5 1.2 1.4 1.7 1.7   Hematocrit 28* 28* 29* 28* 31* 30*   Hemoglobin 8.7* 9.4* 9.2* 9.1* 10.4* 9.9*   MCV 105* 100* 103* 100* 98* 99*   Platelets 303 208 239 240 195 198           Lab results: 10/26/20  0740 10/05/20  0914 09/29/20  0744   Sodium 138 135 137   Potassium 5.2* 4.6 5.0   Chloride 101 102 101   CO2 _0 UN _1 Creatinine 0.83 0.57 0.84   GFR,Caucasian  --   --   --    GFR,Black  --   --   --    Glucose 132* 153* 166*   Calcium 10.1 9.1 9.6   Total Protein 6.8 6.6 6.7   Albumin 4.2 4.1 4.1   ALT 47* 50* 48*   AST 32 33 34   Alk Phos 108* 84 90   Bilirubin,Total 0.4 0.5 0.5     IMAGING DATA: No new imaging data    ASSESSMENT:     Ms. Lopes has now completed 4 cycles of paclitaxel and carboplatin, and more recently the first dose of dose dense adriamycin and cyclophosphamide.  She continues to tolerate chemotherapy with acceptable toxicity.  She experienced some increase in chronic left hip pain with a couple days of what she called sciatic type pain.  We discussed that she should limit use of NSAID's between treatments as it is likely that her platelet count is decreased between treatments.  I suggested that she begin loratadine 10 mg on the day following treatment and take this daily for 5 days since the peg filgrastim may have caused some of the pain she was experiencing.    She has noted mild numbness, initially in the first toe each foot, by the final cycle of taxol involving forefoot and toes.  Since her last visit she feels it may be slightly better; hopefully this will continue to improve as she moves further out from taxane exposure.      Her breast tumor appears to be responding to the chemotherapy.  I have recommended that she continue with treatment.  She is agreeable and will start cycle 2 of doxorubicin and doxorubicin today.  She will return for follow-up in 3 weeks anticipation of starting cycle 3 of planned 4 cycles.    She continues to smoke cigarettes.  I offered  to refer her to our smoking cessation program; she will let me know when she is ready to do this.     PLAN:     Clinical stage IIB triple negative left breast cancer    Start cycle 2 of doxorubicin and cyclophosphamide. This will consist of doxorubicin 60 mg/m and cyclophosphamide 600 mg/m on day 1 of each 21-day cycle.  Pegfilgrastim will be administered on day 2.    Loratadine 10 mg daily on days 1-5.    Return for follow up in 3 weeks; blood work one day prior.  Anticipate beginning cycle 3.    Continue to hold on immunotherapy with pembrolizumab in light of her previous allogenic bone marrow transplant.    Surgical resection after completion of neoadjuvant chemotherapy.      COVID-19    Patient has received the [place] COVID-19 vaccine, receiving the first dose on May 15, 2019, second dose on Jun 05, 2019 and booster dose on October 08, 2019.    She reports that she received updated/second booster dose on October 25, 2020.        Patient advised to continue to take precautions to reduce risk of contracting COVID-19, including social distancing, frequent handwashing and using a mask when in public.    Health maintenance  She reported annual flu vaccine on October 25, 2020.    Referral to tobacco cessation program when ready    Follow-up     Return for follow-up in 3 weeks.    Due to concerns regarding COVID-19, a facemask was worn by myself as well as the patient throughout the office visit, except lately to allow for inspection of the oral cavity.      Ival Bible ANP, BC      [id number] Fredda Hammed, MD  Minus Liberty, MD  Assunta Curtis, MD

## 2020-10-27 NOTE — Progress Notes (Deleted)
Patient presents for port draw prior to potential treatment. Port accessed without difficulty. Flushes easily and blood return noted. Occlusive dressing placed, clean dry and intact. Labs drawn per orders, port remains accessed. Discharged to clinic in stable condition.   Patient complied with masking policy throughout duration of appointment. Writer maintained use of mask throughout duration of appointment. Writer was within 6 feet of patient for > 15 minutes due to the nature of the treatment.

## 2020-10-27 NOTE — Progress Notes (Signed)
[facility] TREATMENT HAND-OFF TOOL:   SITUATION:   Scheduled treatment category for today:     Cancer treatment:  Chemotherapy and Other    Others:  Ok for cycle 2 adriamycin and cytoxan with  Neulasta OBI (please review use of claritin with her)        Is this a new cancer treatment?: No      Patient seen by APP    Consent obtained:  Yes    Location:  [facility]    Labs complete:  Yes    Within parameters: Yes      Date of lab work: 10/26/2020    Current patient status:  Scheduled treatment    OK to treat for scheduled treatment:  Yes

## 2020-10-27 NOTE — Progress Notes (Signed)
Pt presented to infusion room for Adriamycin &cytoxan infusion. Labs, ECHO, BSA/weight and consent reviewed.    IVAD accessed with 19g 3/4" needle,  w/o difficulty, good blood return noted pre treatment. Huber needle covered with occlusive transparent dressing.     Premeds Decadron, Aloxi & Cinvanti admin per order. Adriamycin administered IVP through y-port with free flowing NS running over 15 minutes. Blood return noted periodically while pushing. Oral cryotherapy utilized. Cytoxan administered over 30 minutes. Patient tolerated well.     IVAD with good blood return post treatment. IVAD de accessed, flushed w NS and heparin locked, site benign.     NOBI applied to L arm. Pt states understanding of its function and reasons to call (e.g., site feels wet, light turns red, NOBI falls off). Writer provided pt w instructions including time for removal - NOBI will infuse 27 hours from time of application, and may be removed in 28 hours.       Pt discharged in stable condition. She knows to contact office with any questions or concerns.     Patient complied with masking policy throughout duration of appointment. Writer maintained use of mask throughout duration of appointment. Writer was within 6 feet of patient for >5 minutes due to the nature of the treatment.

## 2020-10-27 NOTE — Patient Instructions (Addendum)
Clinical stage IIB triple negative left breast cancer    Start cycle 2 of doxorubicin and cyclophosphamide. Pegfilgrastim will be administered on day 2.    Continue to hold on immunotherapy with pembrolizumab in light of her previous allogenic bone marrow transplant.    Surgical resection after completion of neoadjuvant chemotherapy.    COVID-19    She is eligible for a second booster dose.   We have recommend that she receive this 14 to 18 days after the chemotherapy treatment.    Advised to continue to take precautions to reduce risk of contracting COVID-19, including social distancing, frequent handwashing and using a mask when in public.    Health maintenance    Patient encouraged to stop smoking cigarettes entirely.  I have encouraged her to schedule an appointment with the smoking cessation clinic.    Recommend patient receive flu vaccine in the next few months.   I have recommend that she receive this 14 to 18 days after the chemotherapy treatment.      Follow-up     Return for follow-up in 3 weeks. Blood work prior - anticipate beginning cycle 3.        October 2022      Sunday Monday Tuesday Wednesday Thursday Friday Saturday                                 1       2     3     4     5     6     7     8       9     10     11     12     13     14     15       16     17     18     19    Outpatient Visit   7:34 AM   [facility]    INJECTION  11:30 AM   (15 min.)   [facility]   [facility]    FOLLOW UP VISIT  12:30 PM   (30 min.)   ,  M, NP   [facility]    CHEMOTHERAPY   1:30 PM   (120 min.)   [facility], [facility] POD E   [facility] 21     22       23     24     25     26     27     28     29       30     31                                           November 2022      Sunday Monday Tuesday Wednesday Thursday Friday Saturday              1     2     3     4     5       6     7      8  9      10    INJECTION  11:45 AM   (15 min.)   [facility], [facility] [place] UP VISIT  12:30 PM   (30 min.)   Loman Chroman, NP   [facility]    CHEMOTHERAPY   1:30 PM   (120 min.)   CAN [facility], PLUTA POD A   [place] UP VISIT   3:30 PM   (30 min.)   Assunta Curtis, MD   [facility] 11     12       13     14     15     16     17     18     19       20     21     22     23     24     25     26       27     28     29      30

## 2020-11-01 ENCOUNTER — Telehealth: Payer: Self-pay | Admitting: Hematology

## 2020-11-02 ENCOUNTER — Encounter: Payer: Self-pay | Admitting: Hematology and Oncology

## 2020-11-03 NOTE — Telephone Encounter (Signed)
Writer called patient to r/s per patients request patient is all set with appt.     While speaking she was wondering if we have rec'd her paperwork for life insurance ( ? ) that was faxed over last week, she stated its a form [name] knew about and she always needs this re filled out once a year. I told her I was ask the team and let her know.       Please advise

## 2020-11-07 ENCOUNTER — Telehealth: Payer: Self-pay | Admitting: Hematology and Oncology

## 2020-11-07 NOTE — Telephone Encounter (Signed)
Writer spoke directly with patient.  See telephone encounter.

## 2020-11-07 NOTE — Telephone Encounter (Signed)
Writer spoke with patient regarding her recent broken tooth.  Patient states the tooth is not bothering her at this time.  Patient informed Probation officer that she has had problems with this tooth in the past and while eating a piece of candy a part of the tooth broke off.  Patient will be seeing her dentist tomorrow and patient will send writer a message regarding any recommendations from the dentist.  Understanding of plan verbalized.

## 2020-11-08 ENCOUNTER — Telehealth: Payer: Self-pay | Admitting: Hematology

## 2020-11-08 NOTE — Telephone Encounter (Signed)
Paperwork received will send it out today

## 2020-11-08 NOTE — Telephone Encounter (Signed)
Rielly is calling to make sure that Carly Higgins received the fax. She sent it about a half hour ago. Please call

## 2020-11-17 ENCOUNTER — Encounter: Payer: Self-pay | Admitting: Oncology

## 2020-11-17 ENCOUNTER — Ambulatory Visit: Payer: Medicare Other | Admitting: Hematology

## 2020-11-17 ENCOUNTER — Ambulatory Visit: Payer: Medicare Other

## 2020-11-17 ENCOUNTER — Encounter: Payer: Self-pay | Admitting: Family

## 2020-11-17 ENCOUNTER — Ambulatory Visit: Payer: Medicare Other | Attending: Oncology | Admitting: Oncology

## 2020-11-17 ENCOUNTER — Ambulatory Visit: Payer: Medicare Other | Admitting: Acupuncturist

## 2020-11-17 ENCOUNTER — Ambulatory Visit: Payer: No Typology Code available for payment source | Admitting: Hematology

## 2020-11-17 ENCOUNTER — Other Ambulatory Visit: Payer: Self-pay

## 2020-11-17 VITALS — BP 126/76 | Temp 96.8°F | Resp 20 | Wt 257.9 lb

## 2020-11-17 VITALS — BP 126/76 | HR 95 | Temp 96.8°F | Resp 20

## 2020-11-17 DIAGNOSIS — D471 Chronic myeloproliferative disease: Secondary | ICD-10-CM

## 2020-11-17 DIAGNOSIS — Z9481 Bone marrow transplant status: Secondary | ICD-10-CM

## 2020-11-17 DIAGNOSIS — Z0000 Encounter for general adult medical examination without abnormal findings: Secondary | ICD-10-CM

## 2020-11-17 DIAGNOSIS — F17210 Nicotine dependence, cigarettes, uncomplicated: Secondary | ICD-10-CM | POA: Insufficient documentation

## 2020-11-17 DIAGNOSIS — F17200 Nicotine dependence, unspecified, uncomplicated: Secondary | ICD-10-CM

## 2020-11-17 DIAGNOSIS — D6481 Anemia due to antineoplastic chemotherapy: Secondary | ICD-10-CM

## 2020-11-17 DIAGNOSIS — M898X9 Other specified disorders of bone, unspecified site: Secondary | ICD-10-CM

## 2020-11-17 DIAGNOSIS — C50412 Malignant neoplasm of upper-outer quadrant of left female breast: Secondary | ICD-10-CM

## 2020-11-17 DIAGNOSIS — Z171 Estrogen receptor negative status [ER-]: Secondary | ICD-10-CM

## 2020-11-17 DIAGNOSIS — D849 Immunodeficiency, unspecified: Secondary | ICD-10-CM

## 2020-11-17 DIAGNOSIS — G629 Polyneuropathy, unspecified: Secondary | ICD-10-CM

## 2020-11-17 DIAGNOSIS — T451X5A Adverse effect of antineoplastic and immunosuppressive drugs, initial encounter: Secondary | ICD-10-CM

## 2020-11-17 DIAGNOSIS — M25552 Pain in left hip: Secondary | ICD-10-CM

## 2020-11-17 DIAGNOSIS — Z01818 Encounter for other preprocedural examination: Secondary | ICD-10-CM

## 2020-11-17 DIAGNOSIS — Z5111 Encounter for antineoplastic chemotherapy: Secondary | ICD-10-CM | POA: Insufficient documentation

## 2020-11-17 DIAGNOSIS — R5383 Other fatigue: Secondary | ICD-10-CM

## 2020-11-17 DIAGNOSIS — R110 Nausea: Secondary | ICD-10-CM

## 2020-11-17 LAB — CBC AND DIFFERENTIAL
Baso # K/uL: 0.1 10*3/uL (ref 0.0–0.1)
Basophil %: 1.4 %
Eos # K/uL: 0.1 10*3/uL (ref 0.0–0.4)
Eosinophil %: 1 %
Hematocrit: 28 % — ABNORMAL LOW (ref 34–45)
Hemoglobin: 8.3 g/dL — ABNORMAL LOW (ref 11.2–15.7)
Lymph # K/uL: 1.1 10*3/uL — ABNORMAL LOW (ref 1.2–3.7)
Lymphocyte %: 22.3 %
MCH: 33 pg — ABNORMAL HIGH (ref 26–32)
MCHC: 30 g/dL — ABNORMAL LOW (ref 32–36)
MCV: 109 fL — ABNORMAL HIGH (ref 79–95)
Mono # K/uL: 0.9 10*3/uL (ref 0.2–0.9)
Monocyte %: 18 %
Neut # K/uL: 2.8 10*3/uL (ref 1.6–6.1)
Platelets: 323 10*3/uL (ref 160–370)
RBC: 2.5 MIL/uL — ABNORMAL LOW (ref 3.9–5.2)
RDW: 20.1 % — ABNORMAL HIGH (ref 11.7–14.4)
Seg Neut %: 57.3 %
WBC: 4.9 10*3/uL (ref 4.0–10.0)

## 2020-11-17 LAB — NEUTROPHIL #-INSTRUMENT: Neutrophil #-Instrument: 2.8 10*3/uL

## 2020-11-17 LAB — COMPREHENSIVE METABOLIC PANEL
ALT: 34 U/L (ref 0–35)
AST: 29 U/L (ref 0–35)
Albumin: 4.1 g/dL (ref 3.5–5.2)
Alk Phos: 85 U/L (ref 35–105)
Anion Gap: 13 (ref 7–16)
Bilirubin,Total: 0.5 mg/dL (ref 0.0–1.2)
CO2: 24 mmol/L (ref 20–28)
Calcium: 9.6 mg/dL (ref 8.6–10.2)
Chloride: 103 mmol/L (ref 96–108)
Creatinine: 0.72 mg/dL (ref 0.51–0.95)
Glucose: 117 mg/dL — ABNORMAL HIGH (ref 60–99)
Lab: 12 mg/dL (ref 6–20)
Potassium: 4.7 mmol/L (ref 3.3–5.1)
Sodium: 140 mmol/L (ref 133–145)
Total Protein: 6.3 g/dL (ref 6.3–7.7)
eGFR BY CREAT: 95 *

## 2020-11-17 MED ORDER — PEGFILGRASTIM (NEULASTA ONPRO) 6 MG/0.6ML SC PSKT *I*
PREFILLED_SYRINGE | SUBCUTANEOUS | Status: AC
Start: 2020-11-17 — End: 2020-11-17
  Administered 2020-11-17: 6 mg via SUBCUTANEOUS
  Filled 2020-11-17: qty 0.6

## 2020-11-17 MED ORDER — DEXAMETHASONE 4 MG PO TABS *I*
ORAL_TABLET | ORAL | Status: DC
Start: 2020-11-17 — End: 2020-11-17
  Filled 2020-11-17: qty 3

## 2020-11-17 MED ORDER — SODIUM CHLORIDE 0.9 % INJ (FLUSH) WRAPPED *I*
10.0000 mL | Status: DC | PRN
Start: 2020-11-17 — End: 2020-11-17
  Administered 2020-11-17: 10 mL

## 2020-11-17 MED ORDER — DOXORUBICIN HCL (ADRIAMYCIN) 2 MG/ML IV SOLN *I*
60.0000 mg/m2 | Freq: Once | INTRAVENOUS | Status: AC
Start: 2020-11-17 — End: 2020-11-17
  Administered 2020-11-17: 140 mg via INTRAVENOUS
  Filled 2020-11-17: qty 70

## 2020-11-17 MED ORDER — PALONOSETRON HCL 0.25 MG/5ML IV SOLN *I*
0.2500 mg | Freq: Once | INTRAVENOUS | Status: AC
Start: 2020-11-17 — End: 2020-11-17
  Administered 2020-11-17: 0.25 mg via INTRAVENOUS

## 2020-11-17 MED ORDER — HEPARIN LOCK FLUSH 10 UNIT/ML IJ SOLN WRAPPED *I*
50.0000 [IU] | INTRAVENOUS | Status: DC | PRN
Start: 2020-11-17 — End: 2020-11-17
  Administered 2020-11-17: 50 [IU]

## 2020-11-17 MED ORDER — SODIUM CHLORIDE 0.9 % IV SOLN WRAPPED *I*
30.0000 mL/h | Status: DC | PRN
Start: 2020-11-17 — End: 2020-11-17
  Administered 2020-11-17 (×2): 30 mL/h via INTRAVENOUS

## 2020-11-17 MED ORDER — PEGFILGRASTIM (NEULASTA ONPRO) 6 MG/0.6ML SC PSKT *I*
6.0000 mg | PREFILLED_SYRINGE | Freq: Once | SUBCUTANEOUS | Status: AC
Start: 2020-11-17 — End: 2020-11-17

## 2020-11-17 MED ORDER — DEXAMETHASONE SOD PHOSPHATE PF 10 MG/ML IJ SOLN *I*
INTRAMUSCULAR | Status: DC
Start: 2020-11-17 — End: 2020-11-17
  Filled 2020-11-17: qty 1

## 2020-11-17 MED ORDER — DEXAMETHASONE SODIUM PHOSPHATE 10 MG/ML IJ SOLN *I*
10.0000 mg | Freq: Once | INTRAMUSCULAR | Status: AC
Start: 2020-11-17 — End: 2020-11-17
  Administered 2020-11-17: 10 mg via INTRAVENOUS

## 2020-11-17 MED ORDER — APREPITANT 130 MG/18ML IV EMUL *I*
INTRAVENOUS | Status: AC
Start: 2020-11-17 — End: 2020-11-17
  Administered 2020-11-17: 130 mg via INTRAVENOUS
  Filled 2020-11-17: qty 18

## 2020-11-17 MED ORDER — APREPITANT 130 MG/18ML IV EMUL *I*
130.0000 mg | Freq: Once | INTRAVENOUS | Status: AC
Start: 2020-11-17 — End: 2020-11-17

## 2020-11-17 MED ORDER — DEXTROSE 5 % IV SOLN WRAPPED *I*
600.0000 mg/m2 | Freq: Once | INTRAVENOUS | Status: AC
Start: 2020-11-17 — End: 2020-11-17
  Administered 2020-11-17: 1404 mg via INTRAVENOUS
  Filled 2020-11-17: qty 7.02

## 2020-11-17 NOTE — Progress Notes (Signed)
[facility] TREATMENT HAND-OFF TOOL:   SITUATION:   Scheduled treatment category for today:     Cancer treatment:  Chemotherapy    Is this a new cancer treatment?: No      Patient seen by APP    Consent obtained:  Yes    Location:  [facility]    Labs complete:  Yes    Within parameters: Yes      Date of lab work: 11/17/2020    Current patient status:  Scheduled treatment    OK to treat for scheduled treatment:  Yes  Dulce Sellar, NP

## 2020-11-17 NOTE — Progress Notes (Addendum)
[facility]     FOLLOW-UP VISIT    PATIENT NAME:  Carly Higgins, Carly Higgins    DOB:  February 10, 1960   MRN:  [id number]  11/17/2020     DIAGNOSIS:  Clinical stage IIB left breast cancer    REASON FOR VISIT: Carine Nordgren is seen today in anticipation of starting cycle 3 of doxorubicin and cyclophosphamide as neoadjuvant chemotherapy for clinical stage IIB (cT2, cN0, cM0) grade 3, ER negative, PR negative, HER2 negative infiltrating ductal carcinoma of the left breast.  She has already completed 4 cycles of weekly paclitaxel and carboplatin.    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  In February 2022 she began to experience discomfort in the left breast.  She first attributed this to having been struck with a part of her dog when she picked it up about 1 week earlier.  She continued to experience discomfort intermittently. She scheduled a screening mammogram, having not had an exam since July 2018.         MPN (myeloproliferative neoplasm)   01/19/2014 Initial Diagnosis    MPN (myeloproliferative neoplasm)     07/15/2020 -  Chemotherapy     Began cycle 1 of weekly paclitaxel and carboplatin on July 15, 2020.  Immunotherapy with pembrolizumab was not recommended due to her previous allogenic bone marrow transplant.   Final cycle (4) of paclitaxel and carboplatin was administered on September 30, 2020.   Began dose dense cyclophosphamide and doxorubicin on October 06, 2020    OP Breast CARBOplatin Weekly Paclitaxel then [facility]   Plan Provider: Beverely Pace, MD  Treatment goal: Neoadjuvant  Line of treatment: [No plan line of treatment]     IDC left 2 o'clock 2.5 cm, ER/PR- her 2-   05/18/2020 Significant Radiology Findings    Screening [place]: scattered tissue, architectural  distortion left 2 o'clock. BIRAD 0       06/07/2020 Significant Radiology Findings    Ultrasound [place]: irregular hypoechoic mass left 2 o'clock, 19x19x12 mm, 25 mm radial. BIRAD 4  Ultrasound guided core IDC     06/15/2020 Initial Diagnosis    IDC left 2 o'clock 2.5 cm, ER/PR- her 2-     06/15/2020 Cancer Staged    Staging form: Breast, AJCC 8th Edition  - Clinical stage from 06/15/2020: Stage IIB (cT2, cN0, cM0, G3, ER-, PR-, HER2-)     07/15/2020 -  Chemotherapy     Began cycle 1 of weekly paclitaxel and carboplatin on July 15, 2020.  Immunotherapy with pembrolizumab was not recommended due to her previous allogenic bone marrow transplant.   Final cycle (4) of paclitaxel and carboplatin was administered on September 30, 2020.   Began dose dense cyclophosphamide and doxorubicin on October 06, 2020    OP Breast CARBOplatin Weekly Paclitaxel then [facility]   Plan Provider: Beverely Pace, MD  Treatment goal: Neoadjuvant  Line of treatment: [No plan line of treatment]     08/18/2020 Procedure    PowerPort in the right internal jugular vein.           INTERVAL HISTORY: Carly Higgins presents  accompanied by her mother today.  Since her visit with Ival Bible on October 27, 2020, when -she  also received cycle 2 dose dense cyclophosphamide and doxorubicin withNOBI ,since last visit  she reports:  She did ok with the last infusion and the on body injector however does experience fatigue 8-9/10 beginning in the 3 days following chemo, is more significant slowly worsened with each treatment this improves over the course of the week. She lays down most of the day and feels "winded easily", noting corresponding  intermittent dry cough,and short of breath with exertion. Denies wheezing or other respiratory symptoms. Denies fever chills, chest pain, dizziness. Headaches, vision changes, rashes, sores  Or sensitivity in the mouth.  She denies nausea which she attributes to taking compazine (each AM) on days 1 and 2 and ondansetron  daily thereafter.  She has not vomited.  She reports occasional constipation and bloating .  She has no other gastrointestinal symptoms including abdominal pain . Appetite has been fair denies weight loss. .      Continued chronic left hip pain 8/10 began  1 year and 2 months ago worse with  ambulation not aggravated by chemo. Hip pain improves following chemotherapy for a few days, has been taking aleve 3 tabs in am and 3 tabs in pm and most recently tylenol 2 tabs this morning unable Last cortisone injection was summer 2022 . Does feel the loratidine 10 mg daily helps with this. Planning for hip replacement after completion of chemotherapy. numbness and tingling, burning sensation in the balls and toes of her feet bilaterally Inand occasionally in her hands since start of chemo would like to work with [facility] acupuncturist and massage therapist.  Gait remains unsteady due to her baseline hip pain.   She no longer is experiencing ache in her lower jaw but does have a broken right lower jaw. She is no longer experiencing pain in the left  breast and feels that the mass has decreased in size. Denies  other musculoskeletal symptoms including back pain or bone pain.  She denies swelling of her extremities.  She denies headaches.  She reports no cognitive changes,focal motor symptoms or new dermatologic symptoms. Continues to smoke, not ready to quit, aware of tobacco cessation program through [facility].     Feels well enough to proceed with treatment today.     Would like to travel to [place] after surgery and is planning to pursue partial mastectomy, would like appt with surgery to discuss plan of care and to meet with radiation oncology.     ROS: As outlined above: comprehensive review of systems is otherwise negative.     Immunization History   Administered Date(s) Administered    COVID-19 MRNA Bivalent Vaccine Booster Pfizer-Biontech 30 mcg/0.58m IM SUSP 10/25/2020    Covid-19 mRNA vaccine (PFIZER) IM 30 mcg/0.379m 05/15/2019, 06/05/2019, 10/08/2019    DTaP(Infanrix) 08/01/2016, 09/25/2016, 11/22/2016    Hepatitis B Adult 08/01/2016, 09/25/2016, 02/21/2017, 08/15/2017    HiB(Acthib/Hiberix) 08/01/2016, 09/25/2016, 11/22/2016    IPV 08/01/2016, 09/25/2016, 11/22/2016    Influenza Historical [id number]([facility]) 11/14/2017    Influenza Quadrivalent 0.28m80mrefilled syringe/single dose vial(FluLaval,Fluzone,Afluria,Fluarix) 12/15/2015, 09/25/2016    Meningococcal Conjugate MCV4O (Menveo) 08/01/2016    Pneumococcal 13-valent Conjugate (Prevnar 13) 08/01/2016, 09/25/2016, 11/22/2016    Pneumococcal Polysaccharide (Pneumovax) 02/21/2017, 08/15/2017    Zoster(Shingrix) 02/21/2017, 08/15/2017     She reports that she received annual flu vaccine and COVID updated booster on October 25, 2020.    PAST MEDICAL/SURGICAL HISTORY:   1. 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  Prior to bone marrow transplant she received 5 cycles of decitabine.  She received fludarabine, busulfan and and hydroxyurea for the transplant and received methotrexate for graft-versus-host prophylaxis.  2. Hypothyroid.  3. Anxiety.  4. Osteoarthritis of the hips, left greater than right.  5. Mild diverticulosis of the sigmoid colon.  6. History of atrial fibrillation and congestive heart failure in 2017 related to the bone marrow transplant.  7. Right cataract surgery.  8. Right rotator cuff surgery.    MEDICATIONS:     Current Outpatient Medications   Medication Sig    acyclovir (ZOVIRAX) 400 mg tablet Take 1 tablet (400 mg total) by mouth 2 times daily    metoprolol tartrate (LOPRESSOR) 50 mg tablet Take 1 tablet (50 mg total) by mouth 2 times daily    dexAMETHasone (DECADRON) 4 MG tablet Take 2 tablets (8 mg) by mouth once daily with food on days 2,3 and 4 of each chemotherapy cycle.    ondansetron (ZOFRAN) 8 mg tablet Take 1  tablet (8 mg total) by mouth every 8 hours as needed  Begin 72 hours after treatment if needed for nausea    prochlorperazine (COMPAZINE) 10 mg tablet Take 1 tablet (10 mg total) by mouth every 6 hours as needed (nausea)    ondansetron (ZOFRAN) 4 mg tablet Take 1 tablet (4 mg total) by mouth 3 times daily as needed (nausea)    cholecalciferol (VITAMIN D) 50 MCG (2000 UT) tablet Take 1,000 Units by mouth daily    levothyroxine (SYNTHROID, LEVOTHROID) 50 MCG tablet Take 50 mcg by mouth daily (before breakfast)    escitalopram (LEXAPRO) 20 MG tablet Take 20 mg by mouth daily    aspirin 81 MG EC tablet Take 81 mg by mouth daily     OBJECTIVE FINDINGS:    Vitals:    10/27/20 1150   BP: 119/70   Pulse: 88   Temp: 35.9 C (96.6 F)   TempSrc: Temporal   SpO2: 96%   Weight: 117.4 kg (258 lb 13.1 oz); down 2.9 kg since last visit     Wt Readings from Last 8 Encounters:   11/17/20 117 kg (257 lb 15 oz)   10/27/20 117.4 kg (258 lb 13.1 oz)   10/06/20 120.3 kg (265 lb 3.4 oz)   09/30/20 119.5 kg (263 lb 7.2 oz)   09/23/20 119.5 kg (263 lb 7.2 oz)   09/15/20 120.2 kg (264 lb 15.9 oz)   09/09/20 119.9 kg (264 lb 5.3 oz)   09/02/20 120.4 kg (265 lb 6.9 oz)     Pain    11/17/20 1200   PainSc:   8   PainLoc: Hip        PHYSICAL EXAMINATION:     General: Reveals a well-appearing woman. ECOG performance status is 1.    HEENT : grossly unremarkable.  Sclerae are anicteric. Conjunctivae are pink.  Oral mucosa is without stomatitis or thrush.   Lymph nodes: There is no palpable cervical, supraclavicular, axillary or inguinal lymph node enlargement.   Chest: clear to auscultation and percussion.  PowerPort is present in the upper chest on the right no erythema, or edema  Cardiovacular: \regular rate and rhythm. No murmur    Breasts:   Left breast has a palpable mass in the upper outer quadrant measuring approximately 1.0 cm.   Right breast  No visible or palpable masses, no edema or erythema  Abdomen: is soft,non tender  Bowel  sounds are +  All quadrants.no hepatosplenomegaly, mass or ascites detected .   Extremities: are without edema.   Neurologic exam: is grossly nonfocal.   Skin: is dry  without rash, petechiae or ecchymoses. patches of vitiligo in the upper abdomen.    LABORATORY DATA:         Lab results: 11/17/20  1158 10/26/20  0740 10/05/20  0914 09/29/20  0744 09/22/20  0913 09/14/20  0942   WBC 4.9 6.2 3.1* 3.5* 3.4* 3.7*   Neut # K/uL 2.8 3.5 1.4* 2.0 1.6 1.7   Lymph # K/uL 1.1* 1.4 1.5 1.2 1.4 1.7   Hematocrit 28* 28* 28* 29* 28* 31*   Hemoglobin 8.3* 8.7* 9.4* 9.2* 9.1* 10.4*   MCV 109* 105* 100* 103* 100* 98*   Platelets 323 303 208 239 240 195               Lab results: 11/17/20  1158 10/26/20  0740 10/05/20  0914 09/29/20  0744   Sodium  --  138 135 137   Potassium  --  5.2* 4.6 5.0   Chloride  --  101 102 101   CO2  --  24 21 23    UN 12 15 12 10    Creatinine 0.72 0.83 0.57 0.84   Glucose  --  132* 153* 166*   Calcium  --  10.1 9.1 9.6           Lab results: 11/17/20  1158 10/26/20  0740 10/05/20  0914 09/29/20  0744 09/22/20  0913   Total Protein  --  6.8 6.6 6.7 6.6   Albumin  --  4.2 4.1 4.1 4.0   ALT  --  47* 50* 48* 38*   AST  --  32 33 34 30   Alk Phos  --  108* 84 90 86   Bilirubin,Total 0.5 0.4 0.5 0.5 0.5           IMAGING DATA: No new imaging data      ASSESSMENT:     Ms. Peterka has now completed 4 cycles of paclitaxel and carboplatin, and 2 cycles of  adriamycin and cyclophosphamide. She presents today in anticipation of cycle 3 of adriamycin and cyclophosphamide (cycle 7 of therapy plan) .  She continues to tolerate chemotherapy with some mild  Nausea without vomitting, moderate to high level of fatigue, and continuing neuropathy in hand and feet unchanged since she stopped taxane. She has continued chronic left hip pain which is slightly increased following chemotherapy and notes some improvement since starting loratadine 10 mg daily.    We discussed that she should limit use of NSAID's between treatments as  it is likely that her platelet count is decreased between treatments and she is exceeding the maxi um daily dose recommendations, placing her at risk for GI bleeding.        Her breast tumor appears to be responding to the chemotherapy.  I have recommended that she continue with treatment.  She is agreeable to continue with next cycle of o adriamycin and cyclophosphamide today. She will return for follow-up in 3 weeks anticipation of her 4th and final  planned cycle. She will be scheduled to follow up with sugrical team for surgical planning       PLAN:     Clinical stage IIB triple negative left breast cancer     Start cycle 3 of doxorubicin and cyclophosphamide. This will consist of doxorubicin 60 mg/m and cyclophosphamide 600 mg/m on day 1 of each  21-day cycle.  Pegfilgrastim will be administered on day 2.   Continue Loratadine 10 mg daily    Return for follow up in 3 weeks; blood work one day prior.  Anticipate beginning cycle 4.   Continue to hold on immunotherapy with pembrolizumab in light of her previous allogenic bone marrow transplant.   Surgical resection after completion of neoadjuvant chemotherapy. patient plans to have partial mastectomy following neoadjuvant chemo   AMB REFERRAL TO RADIATION ONCOLOGY:     2. Chemotherapy-induced nausea   ondansetron (ZOFRAN) 8 mg tablet Take 1 tablet (8 mg total) by mouth every 8 hours as needed Begin 72 hours after chemo treatment if needed for nausea    ondansetron (ZOFRAN) 4 mg tablet Take 1 tablet (4 mg total) by mouth 3 times daily as needed (nausea)    prochlorperazine (COMPAZINE) 10 mg tablet Take 1 tablet (10 mg total) by mouth every 6 hours as needed (nausea)     4. Chemotherapy-induced fatigue   Maintain physical activity   Maintain hydration    Recommend well balanced diet consisting of healthy foods to  help increase your energy levels include fruits, vegetables, whole-grain breads, low-fat dairy products, beans, lean meats, and fish.     5.  Peripheral neuropathy   May receive acupuncture during chemotherapy treatments      6. Antineoplastic chemotherapy induced anemia   CBC prior to next cycle of chemo   Monitor for increasing shortness of breath, fatigue, weakness or dizziness    7. Left hip pain   Notes some improvement following chemotherapy with loratadine 10 mg daily   Continue to follow with ortho; planning for hip replacement after completion of chemotherapy   limit use of NSAID's , not to exceed maximum daily dose from any source due to likelihood of decreased platelet count  between treatments and risk for GI bleed.     Discussed option of  taking tylenol extra strength 1000 mg up to every 6-8 hours (not exceeding recommended maximum daily dose) between NSAID medication.     8. Tobacco dependence   Patient aware of the availability and support of smoking cessation clinic.     9. Health maintenance    COVID-19     Patient has received the [place] COVID-19 vaccine, receiving the first dose on May 15, 2019, second dose on Jun 05, 2019  booster dose on October 08, 2019 and the COVID-19 MRNA Bivalent Vaccine Booster Pfizer-Biontech on  October 25, 2020.     Patient advised to continue to take precautions to reduce risk of contracting COVID-19, including social distancing, frequent handwashing and using a mask when in public.    Influenza    She reported annual flu vaccine on October 25, 2020.    Referral to tobacco cessation program when ready    Follow-up     Return for follow-up in 3 weeks in anticipation of cycle 4 with labs prior      Due to concerns regarding COVID-19, a facemask was worn by myself as well as the patient throughout the office visit, except lately to allow for inspection of the oral cavity.    Dulce Sellar, NP        KX:FGHWE Fredda Hammed, MD  Minus Liberty, MD  Assunta Curtis, MD

## 2020-11-17 NOTE — Progress Notes (Signed)
The patient arrived today for a port draw. Vital signs are stable. The patients port was accessed with a 19g, 3/4" huber needle without difficulty. Positive blood return noted. Labs have been collected and the port was flushed with normal saline. The port site has been dressed with an occlusive transparent dressing and will remain accessed for a potential treatment later today. The port site is benign. The patient is stable at the time of discharge.    The patient complied with masking policy throughout the duration of appointment. Writer maintained use of mask throughout duration of appointment. Writer was within 6 feet of patient for >5 minutes due to the nature of the treatment.    Neesha Langton D Vauda Salvucci, RN

## 2020-11-17 NOTE — Progress Notes (Signed)
Patent completed Covid-19 prescreening at check-in.     Patient seen today for acupuncture at [facility] infusion room.     Diagnosis:      Corinna Gab reviewed patient medications, medical history and cancer treatment history which informed acupuncture plan. This review includes, but is not limited to, patient's reported pain, areas of swelling or pain, level of fatigue, skin health & integrity, as well as risk of bruising, blood clots, and lymphedema.     Patient provided verbal understanding and consent. They have no further questions at this time.     SUBJECTIVE: Patient reports receiving AC today in infusion room and doing okay so far. Corinna Gab provided acupuncture to patient's top of the head, hand, foot and lower leg.     OBJECTIVE: Corinna Gab observed patient enjoy the acupuncture. Patient was very relaxed while having the treatment.      TREATMENT: Corinna Gab provided a 15-20  minutes acupuncture to patient's top of the head, hand, lower leg and/or feet while patient was seated in recliner.  Acupuncture points: Head 5 [x] , LU 4 [] , PC 6 [] , ST 36 [] , SP 6 [] , LV 2 [] , KI 4 [] .  Feet extra points     PATIENTS RESPONSE: Patient responded favorably.     Patient complied with masking policy throughout duration of appointment. Corinna Gab maintained use of a PPE throughout duration of appointment. Corinna Gab was within 6 feet of patient for less than 5 minutes due to the nature of the treatment.

## 2020-11-17 NOTE — Progress Notes (Signed)
Pt presented to infusion room for Adriamycin &cytoxan infusion. Labs, ECHO, BSA/weight and consent reviewed.    IVAD accessed with 19g 3/4" needle,  w/o difficulty, good blood return noted pre treatment. Huber needle covered with occlusive transparent dressing.      Premeds Decadron, Aloxi & Cinvanti admin per order. Adriamycin administered IVP through y-port with free flowing NS running over 15 minutes. Blood return noted periodically while pushing. Oral cryotherapy utilized. Cytoxan administered over 30 minutes. Patient tolerated well.     IVAD with good blood return post treatment. IVAD de accessed, flushed w NS and heparin locked, site benign.     NOBI applied to L arm. Pt states understanding of its function and reasons to call (e.g., site feels wet, light turns red, NOBI falls off). Writer provided pt w instructions including time for removal - NOBI will infuse 27 hours from time of application, and may be removed in 28 hours.     Pt discharged in stable condition. She knows to contact office with any questions or concerns.     Patient complied with masking policy throughout duration of appointment. Writer maintained use of mask throughout duration of appointment. Writer was within 6 feet of patient for >5 minutes due to the nature of the treatment.

## 2020-11-17 NOTE — Patient Instructions (Addendum)
November 2022      Sunday Monday Tuesday Wednesday Thursday Friday Saturday             1     2     3     4     5       6     7     8     9     10    INJECTION  11:45 AM   (15 min.)   Prosser, Anthony D, RN   [facility]    FOLLOW UP VISIT  12:30 PM   (30 min.)   Miller, Michelle M, NP   [facility]    CHEMOTHERAPY   1:30 PM   (120 min.)   CAN CTR, PLUTA POD A   [facility] 11     12       13     14     15     16     17    INJECTION  10:00 AM   (30 min.)   CAN CTR, BMT POD 2   [facility]    FOLLOW UP VISIT  11:00 AM   (30 min.)   Liesveld, Jane, MD   [facility] 18     19       20     21     22     23     24     25     26       27     28     29     06 January 2021      Sunday Monday Tuesday Wednesday Thursday Friday Saturday                       1.  Port draw ____  Follow up ___  Chemotherapy___       2     3       4     5     6     7     8     9     10       11     12     13     14     15     16     17       18     19     20     21     22     23     24       25     26     27     28     29     30     31 

## 2020-11-24 ENCOUNTER — Ambulatory Visit: Payer: Medicare Other | Attending: Hematology | Admitting: Hematology

## 2020-11-24 ENCOUNTER — Other Ambulatory Visit
Admission: RE | Admit: 2020-11-24 | Discharge: 2020-11-24 | Disposition: A | Discharge status: Home / Self Care | Payer: Medicare Other | Source: Ambulatory Visit

## 2020-11-24 ENCOUNTER — Ambulatory Visit: Payer: Medicare Other

## 2020-11-24 ENCOUNTER — Other Ambulatory Visit: Payer: Self-pay

## 2020-11-24 VITALS — BP 119/68 | HR 104 | Temp 96.6°F | Resp 16 | Ht 66.06 in | Wt 254.9 lb

## 2020-11-24 DIAGNOSIS — D471 Chronic myeloproliferative disease: Secondary | ICD-10-CM | POA: Insufficient documentation

## 2020-11-24 DIAGNOSIS — C50412 Malignant neoplasm of upper-outer quadrant of left female breast: Secondary | ICD-10-CM | POA: Insufficient documentation

## 2020-11-24 DIAGNOSIS — Z171 Estrogen receptor negative status [ER-]: Secondary | ICD-10-CM | POA: Insufficient documentation

## 2020-11-24 LAB — COMPREHENSIVE METABOLIC PANEL
ALT: 26 U/L (ref 0–35)
AST: 18 U/L (ref 0–35)
Albumin: 4.1 g/dL (ref 3.5–5.2)
Alk Phos: 95 U/L (ref 35–105)
Anion Gap: 11 (ref 7–16)
Bilirubin,Total: 0.6 mg/dL (ref 0.0–1.2)
CO2: 26 mmol/L (ref 20–28)
Calcium: 9.7 mg/dL (ref 8.6–10.2)
Chloride: 99 mmol/L (ref 96–108)
Creatinine: 0.61 mg/dL (ref 0.51–0.95)
Glucose: 138 mg/dL — ABNORMAL HIGH (ref 60–99)
Lab: 13 mg/dL (ref 6–20)
Potassium: 4.8 mmol/L (ref 3.3–5.1)
Sodium: 136 mmol/L (ref 133–145)
Total Protein: 6.2 g/dL — ABNORMAL LOW (ref 6.3–7.7)
eGFR BY CREAT: 102 *

## 2020-11-24 LAB — CBC AND DIFFERENTIAL
Baso # K/uL: 0 10*3/uL (ref 0.0–0.1)
Basophil %: 2.6 %
Eos # K/uL: 0 10*3/uL (ref 0.0–0.4)
Eosinophil %: 6.4 %
Hematocrit: 28 % — ABNORMAL LOW (ref 34–45)
Hemoglobin: 8.7 g/dL — ABNORMAL LOW (ref 11.2–15.7)
Lymph # K/uL: 0.5 10*3/uL — ABNORMAL LOW (ref 1.2–3.7)
Lymphocyte %: 82 %
MCH: 32 pg (ref 26–32)
MCHC: 31 g/dL — ABNORMAL LOW (ref 32–36)
MCV: 104 fL — ABNORMAL HIGH (ref 79–95)
Mono # K/uL: 0 10*3/uL — ABNORMAL LOW (ref 0.2–0.9)
Monocyte %: 1.3 %
Neut # K/uL: 0.1 10*3/uL — ABNORMAL LOW (ref 1.6–6.1)
Nucl RBC # K/uL: 0 10*3/uL (ref 0.0–0.0)
Nucl RBC %: 0 /100 WBC (ref 0.0–0.2)
Platelets: 145 10*3/uL — ABNORMAL LOW (ref 160–370)
RBC: 2.7 MIL/uL — ABNORMAL LOW (ref 3.9–5.2)
RDW: 17 % — ABNORMAL HIGH (ref 11.7–14.4)
Seg Neut %: 5.1 %
WBC: 0.6 10*3/uL — ABNORMAL LOW (ref 4.0–10.0)

## 2020-11-24 LAB — DIFF MANUAL
Bands %: 3 % (ref 0–10)
Diff Based On: 78 CELLS

## 2020-11-24 LAB — LACTATE DEHYDROGENASE: LD: 180 U/L (ref 118–225)

## 2020-11-24 LAB — NEUTROPHIL #-INSTRUMENT: Neutrophil #-Instrument: 0 10*3/uL

## 2020-11-24 LAB — MULTIPLE ORDERING DOCS

## 2020-11-24 NOTE — Progress Notes (Signed)
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 6 years post-SCT.   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 This presented as some left breast pain after her dog bruised her.     Chemotherapy    Began cycle 1 of weekly paclitaxel and carboplatin on July 15, 2020.  Immunotherapy with pembrolizumab was not recommended due to her previous allogenic bone marrow transplant.   Final cycle (4) of paclitaxel and carboplatin was administered on September 30, 2020.   Began dose dense cyclophosphamide and doxorubicin on October 06, 2020       She states she will have surgery and radiation after completion of the chemotherapy.   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  This is severely limiting her mobility.  She now has neuropathy that comes up to ankles as well. She is being extremely  careful with ambulation.   She will have a physical with [name] next week.   She plans to take her Mom to [facility] later this winter.     GVHD symptomatology:  no new rash, eye or mouth dryness. Breathing is stable.  No change in bowel or bladder function. She walked all the way from the garage and got tired but not winded.   Continues to report daily nausea if not taking ondansetron. On PPI.     []  fevers  []  palpitations []  urinary frequency   []  chills []  chest pain []  dysuria    []  dry mouth []  nausea/emesis  []  rash   []  ear pain []  abdominal cramping []  skin changes   []  dry mouth []  abdominal discomfort []  pain   []  sore throat []  loose stool, non-formed [x]  fatigue   []  cough  []  diarrhea, watery []  enlarged lymph nodes   []  sputum production  []  weight loss [x]  Joint pains   []  shortness of breath []  insufficient oral intake []  none of the above      Medications  Reviewed medications with patient today & electronically reconciled  Current Outpatient Medications on File Prior to Visit   Medication Sig Dispense Refill    acyclovir (ZOVIRAX) 400 mg tablet Take 1 tablet (400 mg total) by mouth 2 times daily 180 tablet 3    metoprolol tartrate (LOPRESSOR) 50 mg tablet Take 1 tablet (50 mg total) by mouth 2 times daily 120 tablet 3    dexAMETHasone (DECADRON) 4 MG tablet Take 2 tablets (8 mg) by mouth once daily with food on days 2,3 and 4 of each chemotherapy cycle. 24 tablet 0    ondansetron (ZOFRAN) 8 mg tablet Take 1 tablet (8 mg total) by mouth every 8 hours as needed  Begin 72 hours after treatment if needed for nausea 20 tablet 5    prochlorperazine (COMPAZINE) 10 mg tablet Take 1 tablet (10 mg total) by mouth every 6 hours as needed (nausea) 30 tablet 5    ondansetron (ZOFRAN) 4 mg tablet Take 1 tablet (4 mg total) by mouth 3 times daily as needed (nausea) 60 tablet 3    cholecalciferol (VITAMIN D) 50 MCG (2000 UT) tablet Take 1,000 Units by mouth daily      levothyroxine (SYNTHROID, LEVOTHROID) 50 MCG tablet  Take 50 mcg by mouth daily (before breakfast)      escitalopram (LEXAPRO) 20 MG tablet Take 20 mg by mouth daily  5    aspirin 81 MG EC tablet Take 81 mg by mouth daily       No current facility-administered medications on file prior to visit.     VS, weight, KPS  BP 119/68    Pulse 104    Temp 35.9 C (96.6 F) (Temporal)    Resp 16    Ht 167.8 cm (5' 6.06")    Wt 115.6 kg (254 lb 13.6 oz)    SpO2 99%    BMI 41.06 kg/m     Wt Readings from Last 3 Encounters:   11/24/20 115.6 kg (254 lb 13.6 oz)   11/17/20 117 kg (257 lb 15 oz)   10/27/20 117.4 kg (258 lb 13.1 oz)       KPS%: 80    Physical Exam   General Appearance:    NAD, Still walks with a limp   Head:  Normocephalic, atraumatic; now alopecic   Eyes:  Conjunctivae/corneas clear, no eye drainage   Oral/Throat: MMM without lesions, irritation or exudate   Neck: Supple, normal turgor; no  adenopathy.    Heart: S1, S2 normal, regular rhythm, 2/6 systolic ejection murmur; no appreciable JVD   Lungs:   Respirations unlabored, CTAB without wheezes, rales or rhonchi   Abdomen:   Soft, non-tender, bowel sounds active all four quadrants, no masses, no organomegaly   Extremities: No edema   Pulses: Not checked.    Skin: No rash     Recent Results (from the past 24 hour(s))   Multiple ordering docs    Collection Time: 11/24/20  9:39 AM   Result Value Ref Range    Mult Providers see below    Lactate dehydrogenase    Collection Time: 11/24/20  9:41 AM   Result Value Ref Range    LD 180 118 - 225 U/L   CBC and differential    Collection Time: 11/24/20  9:41 AM   Result Value Ref Range    WBC 0.6 (L) 4.0 - 10.0 THOU/uL    RBC 2.7 (L) 3.9 - 5.2 MIL/uL    Hemoglobin 8.7 (L) 11.2 - 15.7 g/dL    Hematocrit 28 (L) 34 - 45 %    MCV 104 (H) 79 - 95 fL    MCH 32 26 - 32 pg    MCHC 31 (L) 32 - 36 g/dL    RDW 17.0 (H) 11.7 - 14.4 %    Platelets 145 (L) 160 - 370 THOU/uL    Seg Neut % 5.1 %    Lymphocyte % 82.0 %    Monocyte % 1.3 %    Eosinophil % 6.4 %    Basophil % 2.6 %    Neut # K/uL 0.1 (L) 1.6 - 6.1 THOU/uL    Lymph # K/uL 0.5 (L) 1.2 - 3.7 THOU/uL    Mono # K/uL 0.0 (L) 0.2 - 0.9 THOU/uL    Eos # K/uL 0.0 0.0 - 0.4 THOU/uL    Baso # K/uL 0.0 0.0 - 0.1 THOU/uL    Nucl RBC % 0.0 0.0 - 0.2 /100 WBC    Nucl RBC # K/uL 0.0 0.0 - 0.0 THOU/uL   Comprehensive metabolic panel    Collection Time: 11/24/20  9:41 AM   Result Value Ref Range    Sodium 136 133 - 145 mmol/L    Potassium  4.8 3.3 - 5.1 mmol/L    Chloride 99 96 - 108 mmol/L    CO2 26 20 - 28 mmol/L    Anion Gap 11 7 - 16    UN 13 6 - 20 mg/dL    Creatinine 0.61 0.51 - 0.95 mg/dL    eGFR BY CREAT 102 *    Glucose 138 (H) 60 - 99 mg/dL    Calcium 9.7 8.6 - 10.2 mg/dL    Total Protein 6.2 (L) 6.3 - 7.7 g/dL    Albumin 4.1 3.5 - 5.2 g/dL    Bilirubin,Total 0.6 0.0 - 1.2 mg/dL    AST 18 0 - 35 U/L    ALT 26 0 - 35 U/L    Alk Phos 95 35 - 105 U/L   Neutrophil #-Instrument     Collection Time: 11/24/20  9:41 AM   Result Value Ref Range    Neutrophil #-Instrument 0.0 THOU/uL   Diff manual    Collection Time: 11/24/20  9:41 AM   Result Value Ref Range    Bands % 3 0 - 10 %    Dohle Bodies Present     Toxic Granulation Present     Giant PLTs Present     Manual DIFF RESULTS     Diff Based On 78 CELLS         Lab results: 11/24/20  0941   Sodium 136   Potassium 4.8   Chloride 99   CO2 26   UN 13   Creatinine 0.61   Glucose 138*   Calcium 9.7   Total Protein 6.2*   Albumin 4.1   ALT 26   AST 18   Alk Phos 95   Bilirubin,Total 0.6       Assessment and Plan  Carly Higgins is a 60 y.o. female with unclassifiable myeloproliferative neoplasm and completed Decitabine 3m/m2 IV daily x 5 days - Cycle # 1 12/15/14 and had been on Hydrea prior to transplant. 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    Atypical MPN S/P URD allo, now at 5. 5 years  Day 30 marrow (6/6) hypercellular marrow, no evidence of malignancy, chimerism 2% recipient  Day 100 BMbx (09/15/15) Normochromic normocytic anemia. Mild lymphopenia. Mildly hypercellular bone marrow with trilineage hematopoiesis. Chimerism 2% recipient.  1 year marrow with normocellularity and 100% donor chimerism     GVHD  Full dose MTX given day 1,3,6. 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     Breast cancer diagnosed in summer of 2022--now on neoadjuvant chemotherapy.   She is now neutropenic, and I instructed her to call Dr. YIsaiah Sergeor [place] she has a fever.   I do not know if G-CSF is routinely given with this regimen. She is on acyclovir but no other antibiotic prophylaxis.       ID  Hx Cholecystitis, pneumonia 05/2015  CMV PCR not required;  both negative   Will continue acyclovir for now.    Completed vaccines.  She has MMR left (June, 2020).  Had flu shot and COVID vaccine and one boost.     She has done influenza vaccine for this season, and she had the bivalent COVID vaccine booster.   CV/Pulm  Hx A-flutter  Cont Metoprolol 50 mg BID  Followed by Dr. MKathi [facility]    Acute  KI on CKD r/t tacrolimus  Cr is now normal.   GI  Cont protonix; especially as she is using NSAIDs but not as much as in past. .   Zofran prn      MSK  Joint replacement on hold due to breast cancer treatment.   Smoking cessation   Has been able to abstain thus far    Depression--somewhat situational    Derm  Rash overall c/w tinea vesicolor in past    Follow up  BMT clinic:1 year now that she is > 5 years post-SCT.   Bloodwork frequency: labs prior      Cheron Schaumann, MD

## 2020-11-29 DIAGNOSIS — D0590 Unspecified type of carcinoma in situ of unspecified breast: Secondary | ICD-10-CM | POA: Insufficient documentation

## 2020-11-29 HISTORY — DX: Unspecified type of carcinoma in situ of unspecified breast: D05.90

## 2020-12-07 ENCOUNTER — Other Ambulatory Visit: Payer: Self-pay | Admitting: Oncology

## 2020-12-08 ENCOUNTER — Ambulatory Visit: Payer: Medicare Other | Admitting: Acupuncturist

## 2020-12-08 ENCOUNTER — Other Ambulatory Visit: Payer: Self-pay

## 2020-12-08 ENCOUNTER — Ambulatory Visit: Payer: Medicare Other

## 2020-12-08 ENCOUNTER — Encounter: Payer: Self-pay | Admitting: Family

## 2020-12-08 ENCOUNTER — Ambulatory Visit: Payer: Medicare Other | Attending: Oncology | Admitting: Family

## 2020-12-08 ENCOUNTER — Telehealth: Payer: Self-pay | Admitting: Hematology and Oncology

## 2020-12-08 ENCOUNTER — Other Ambulatory Visit
Admission: RE | Admit: 2020-12-08 | Discharge: 2020-12-08 | Disposition: A | Discharge status: Home / Self Care | Payer: Medicare Other | Source: Ambulatory Visit

## 2020-12-08 VITALS — BP 114/78 | HR 99 | Temp 97.5°F

## 2020-12-08 DIAGNOSIS — D471 Chronic myeloproliferative disease: Secondary | ICD-10-CM

## 2020-12-08 DIAGNOSIS — Z171 Estrogen receptor negative status [ER-]: Secondary | ICD-10-CM

## 2020-12-08 DIAGNOSIS — Z9481 Bone marrow transplant status: Secondary | ICD-10-CM

## 2020-12-08 DIAGNOSIS — D6481 Anemia due to antineoplastic chemotherapy: Secondary | ICD-10-CM

## 2020-12-08 DIAGNOSIS — Z0000 Encounter for general adult medical examination without abnormal findings: Secondary | ICD-10-CM

## 2020-12-08 DIAGNOSIS — G629 Polyneuropathy, unspecified: Secondary | ICD-10-CM

## 2020-12-08 DIAGNOSIS — F17200 Nicotine dependence, unspecified, uncomplicated: Secondary | ICD-10-CM

## 2020-12-08 DIAGNOSIS — Z5111 Encounter for antineoplastic chemotherapy: Secondary | ICD-10-CM | POA: Insufficient documentation

## 2020-12-08 DIAGNOSIS — R110 Nausea: Secondary | ICD-10-CM

## 2020-12-08 DIAGNOSIS — D849 Immunodeficiency, unspecified: Secondary | ICD-10-CM

## 2020-12-08 DIAGNOSIS — T451X5A Adverse effect of antineoplastic and immunosuppressive drugs, initial encounter: Secondary | ICD-10-CM

## 2020-12-08 DIAGNOSIS — R5383 Other fatigue: Secondary | ICD-10-CM

## 2020-12-08 DIAGNOSIS — E119 Type 2 diabetes mellitus without complications: Secondary | ICD-10-CM | POA: Insufficient documentation

## 2020-12-08 DIAGNOSIS — M25552 Pain in left hip: Secondary | ICD-10-CM

## 2020-12-08 DIAGNOSIS — F17210 Nicotine dependence, cigarettes, uncomplicated: Secondary | ICD-10-CM | POA: Insufficient documentation

## 2020-12-08 DIAGNOSIS — C50412 Malignant neoplasm of upper-outer quadrant of left female breast: Secondary | ICD-10-CM | POA: Insufficient documentation

## 2020-12-08 LAB — CBC AND DIFFERENTIAL
Baso # K/uL: 0.1 10*3/uL (ref 0.0–0.1)
Basophil %: 1.4 %
Eos # K/uL: 0.1 10*3/uL (ref 0.0–0.4)
Eosinophil %: 1.5 %
Hematocrit: 28 % — ABNORMAL LOW (ref 34–45)
Hemoglobin: 8.7 g/dL — ABNORMAL LOW (ref 11.2–15.7)
Lymph # K/uL: 0.9 10*3/uL — ABNORMAL LOW (ref 1.2–3.7)
Lymphocyte %: 16 %
MCH: 33 pg — ABNORMAL HIGH (ref 26–32)
MCHC: 31 g/dL — ABNORMAL LOW (ref 32–36)
MCV: 106 fL — ABNORMAL HIGH (ref 79–95)
Mono # K/uL: 1.1 10*3/uL — ABNORMAL HIGH (ref 0.2–0.9)
Monocyte %: 18.1 %
Neut # K/uL: 3.7 10*3/uL (ref 1.6–6.1)
Platelets: 320 10*3/uL (ref 160–370)
RBC: 2.7 MIL/uL — ABNORMAL LOW (ref 3.9–5.2)
RDW: 19.1 % — ABNORMAL HIGH (ref 11.7–14.4)
Seg Neut %: 63 %
WBC: 5.8 10*3/uL (ref 4.0–10.0)

## 2020-12-08 LAB — COMPREHENSIVE METABOLIC PANEL
ALT: 35 U/L (ref 0–35)
AST: 35 U/L (ref 0–35)
Albumin: 4.1 g/dL (ref 3.5–5.2)
Alk Phos: 82 U/L (ref 35–105)
Anion Gap: 12 (ref 7–16)
Bilirubin,Total: 0.4 mg/dL (ref 0.0–1.2)
CO2: 24 mmol/L (ref 20–28)
Calcium: 9.7 mg/dL (ref 8.6–10.2)
Chloride: 102 mmol/L (ref 96–108)
Creatinine: 0.6 mg/dL (ref 0.51–0.95)
Glucose: 121 mg/dL — ABNORMAL HIGH (ref 60–99)
Lab: 14 mg/dL (ref 6–20)
Potassium: 4.4 mmol/L (ref 3.3–5.1)
Sodium: 138 mmol/L (ref 133–145)
Total Protein: 6.3 g/dL (ref 6.3–7.7)
eGFR BY CREAT: 102 *

## 2020-12-08 LAB — LIPID PANEL
Chol/HDL Ratio: 4.9
Cholesterol: 192 mg/dL
HDL: 39 mg/dL — ABNORMAL LOW (ref 40–60)
LDL Calculated: 119 mg/dL
Non HDL Cholesterol: 153 mg/dL
Triglycerides: 170 mg/dL — AB

## 2020-12-08 LAB — NEUTROPHIL #-INSTRUMENT: Neutrophil #-Instrument: 3.7 10*3/uL

## 2020-12-08 MED ORDER — SEQUENCING OF DRUGS FOR ADMINISTRATION *I*
1.0000 | Status: DC | PRN
Start: 2020-12-08 — End: 2020-12-08
  Filled 2020-12-08: qty 1

## 2020-12-08 MED ORDER — PEGFILGRASTIM (NEULASTA ONPRO) 6 MG/0.6ML SC PSKT *I*
PREFILLED_SYRINGE | SUBCUTANEOUS | Status: AC
Start: 2020-12-08 — End: 2020-12-08
  Administered 2020-12-08: 6 mg via SUBCUTANEOUS
  Filled 2020-12-08: qty 0.6

## 2020-12-08 MED ORDER — DEXAMETHASONE SODIUM PHOSPHATE 10 MG/ML IJ SOLN *I*
10.0000 mg | Freq: Once | INTRAMUSCULAR | Status: AC
Start: 2020-12-08 — End: 2020-12-08

## 2020-12-08 MED ORDER — PALONOSETRON HCL 0.25 MG/5ML IV SOLN *I*
0.2500 mg | Freq: Once | INTRAVENOUS | Status: AC
Start: 2020-12-08 — End: 2020-12-08
  Administered 2020-12-08: 0.25 mg via INTRAVENOUS

## 2020-12-08 MED ORDER — SODIUM CHLORIDE 0.9 % INJ (FLUSH) WRAPPED *I*
10.0000 mL | Status: DC | PRN
Start: 2020-12-08 — End: 2020-12-08

## 2020-12-08 MED ORDER — DOXORUBICIN HCL (ADRIAMYCIN) 2 MG/ML IV SOLN *I*
60.0000 mg/m2 | Freq: Once | INTRAVENOUS | Status: AC
Start: 2020-12-08 — End: 2020-12-08
  Administered 2020-12-08: 140 mg via INTRAVENOUS
  Filled 2020-12-08: qty 70

## 2020-12-08 MED ORDER — DEXTROSE 5 % IV SOLN WRAPPED *I*
600.0000 mg/m2 | Freq: Once | INTRAVENOUS | Status: AC
Start: 2020-12-08 — End: 2020-12-08
  Administered 2020-12-08: 1404 mg via INTRAVENOUS
  Filled 2020-12-08: qty 7.02

## 2020-12-08 MED ORDER — HEPARIN LOCK FLUSH 10 UNIT/ML IJ SOLN WRAPPED *I*
50.0000 [IU] | INTRAVENOUS | Status: DC | PRN
Start: 2020-12-08 — End: 2020-12-08
  Administered 2020-12-08: 50 [IU]

## 2020-12-08 MED ORDER — APREPITANT 130 MG/18ML IV EMUL *I*
INTRAVENOUS | Status: AC
Start: 2020-12-08 — End: 2020-12-08
  Administered 2020-12-08: 130 mg via INTRAVENOUS
  Filled 2020-12-08: qty 18

## 2020-12-08 MED ORDER — SODIUM CHLORIDE 0.9 % INJ (FLUSH) WRAPPED *I*
10.0000 mL | Status: DC | PRN
Start: 2020-12-08 — End: 2020-12-08
  Administered 2020-12-08: 10 mL

## 2020-12-08 MED ORDER — DEXAMETHASONE SOD PHOSPHATE PF 10 MG/ML IJ SOLN *I*
INTRAMUSCULAR | Status: AC
Start: 2020-12-08 — End: 2020-12-08
  Administered 2020-12-08: 10 mg via INTRAVENOUS
  Filled 2020-12-08: qty 1

## 2020-12-08 MED ORDER — APREPITANT 130 MG/18ML IV EMUL *I*
130.0000 mg | Freq: Once | INTRAVENOUS | Status: AC
Start: 2020-12-08 — End: 2020-12-08

## 2020-12-08 MED ORDER — PEGFILGRASTIM (NEULASTA ONPRO) 6 MG/0.6ML SC PSKT *I*
6.0000 mg | PREFILLED_SYRINGE | Freq: Once | SUBCUTANEOUS | Status: AC
Start: 2020-12-08 — End: 2020-12-08

## 2020-12-08 MED ORDER — SODIUM CHLORIDE 0.9 % FLUSH FOR PUMPS *I*
30.0000 mL | INTRAVENOUS | Status: DC | PRN
Start: 2020-12-08 — End: 2020-12-08
  Administered 2020-12-08: 500 mL via INTRAVENOUS

## 2020-12-08 NOTE — Progress Notes (Signed)
[facility] TREATMENT HAND-OFF TOOL:   SITUATION:   Scheduled treatment category for today:     Cancer treatment:  Chemotherapy    Is this a new cancer treatment?: No      Patient seen by APP    Consent obtained:  Yes    Location:  [facility]    Labs complete:  Yes    Within parameters: Yes      Date of lab work: 12/08/2020    Current patient status:  Scheduled treatment    OK to treat for scheduled treatment:  Yes  Ok for final cycle of Doxarubicin and Cyclophosphamide    Dulce Sellar, NP

## 2020-12-08 NOTE — Progress Notes (Signed)
[place]     FOLLOW-UP VISIT    PATIENT NAME:  Carly Higgins, Carly Higgins    DOB:  09-14-1960   MRN:  [id number]  12/08/2020     DIAGNOSIS:  Clinical stage IIB left breast cancer    REASON FOR VISIT: Carly Higgins is seen today in anticipation of starting cycle 4 of doxorubicin and cyclophosphamide as neoadjuvant chemotherapy for clinical stage IIB (cT2, cN0, cM0) grade 3, ER negative, PR negative, HER2 negative infiltrating ductal carcinoma of the left breast.  She has already completed 4 cycles of weekly paclitaxel and carboplatin.    No chief complaint on file.    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    In February 2022 she began to experience discomfort in the left breast.  She first attributed this to having been struck with a part of her dog when she picked it up about 1 week earlier.  She continued to experience discomfort intermittently. She scheduled a screening mammogram, having not had an exam since July 2018.         MPN (myeloproliferative neoplasm)   01/19/2014 Initial Diagnosis    MPN (myeloproliferative neoplasm)     07/15/2020 -  Chemotherapy     Began cycle 1 of weekly paclitaxel and carboplatin on July 15, 2020.  Immunotherapy with pembrolizumab was not recommended due to her previous allogenic bone marrow transplant.   Final cycle (4) of paclitaxel and carboplatin was administered on September 30, 2020.   Began dose dense cyclophosphamide and doxorubicin on October 06, 2020    OP Breast CARBOplatin Weekly Paclitaxel then [facility]   Plan Provider: Beverely Pace, Higgins  Treatment goal: Neoadjuvant  Line of treatment: [No plan line of treatment]     IDC left 2 o'clock 2.5 cm, ER/PR- her 2-   05/18/2020 Significant Radiology Findings    Screening [place]: scattered tissue, architectural distortion left 2 o'clock. BIRAD 0       06/07/2020 Significant  Radiology Findings    Ultrasound [place]: irregular hypoechoic mass left 2 o'clock, 19x19x12 mm, 25 mm radial. middle third upper outer quadrant of the left breast. BIRAD 4  Ultrasound guided core IDC     06/09/2020 Procedure    ultrasound-guided core biopsy of the left breast 2:00 lesion:   Biopsy revealed invasive ductal carcinoma.  Provisional combined Nottingham grade was 3 of 3 (tubule formation score 3, nuclear grade 3, mitotic rate score 2).  Tumor cells were negative for estrogen receptors and progesterone receptors.  Tumor cells were negative for HER2 by immunohistochemical staining with a staining score of 1+.  Ki-67 staining was seen in approximately 70% of tumor cells.        06/13/2020 Consultation    She was seen in the Hereditary Oncology clinic :  Because of her prior allogeneic bone marrow transplant, she was not eligible for standard germline testing with blood or saliva specimen.       06/15/2020 Initial Diagnosis    IDC left 2 o'clock 2.5 cm, ER/PR- her 2-     06/15/2020 Cancer Staged    Staging form: Breast, AJCC 8th Edition  - Clinical stage from 06/15/2020: Stage IIB (cT2, cN0, cM0, G3, ER-, PR-, HER2-)     06/15/2020 Consultation    Surgical consultation:  Dr. Freda Munro      06/20/2020 Significant Radiology Findings  Whole-body bone scan : No abnormal focus of radiotracer uptake to suggest osseous metastatic disease.     06/22/2020 Significant Radiology Findings    CT of the chest: mill-defined enhancing lesion in the lateral left breast measuring 1.7 x 1.2 x 1.1 cm.  There were asymmetrically prominent level 1 left axillary lymph nodes, enhancement of uncertain significance, possibly representing metastatic disease. No enlarged mediastinal, hilar, intramammary,  supraclavicular lymph nodes or suspicious pulmonary nodules were identified.       06/22/2020 Significant Radiology Findings    CT of the abdomen pelvis : no evidence of metastatic disease . small fat filled umbilical hernia noted.      06/28/2020 Significant Radiology Findings    MRI of the breast: irregular mass with spiculated margins measuring 10 x 25 x 19 mm in the left breast upper outer quadrant at the 2 o'clock position located 3 cm from the nipple.      07/15/2020 -  Chemotherapy     Began cycle 1 of weekly paclitaxel and carboplatin on July 15, 2020.  Immunotherapy with pembrolizumab was not recommended due to her previous allogenic bone marrow transplant.   Final cycle (4) of paclitaxel and carboplatin was administered on September 30, 2020.   Began dose dense cyclophosphamide and doxorubicin on October 06, 2020    OP Breast CARBOplatin Weekly Paclitaxel then [facility]   Plan Provider: Beverely Pace, Higgins  Treatment goal: Neoadjuvant  Line of treatment: [No plan line of treatment]     08/18/2020 Procedure    PowerPort in the right internal jugular vein.           INTERVAL HISTORY: Carly Higgins presents  unaccompanied by her mother.     She reports since her last visit  on November 17, 2020, when she also received cycle 3 dose dense cyclophosphamide and doxorubicin with NOBI since last visit : She did ok with the last infusion and the on body injector. Believes the NOBI administered an hour later than expected.   Poor appetite, unintentional weight loss. Does have interest in weight loss and is trying to make food choices but appetite is poor over the last month. He has a sensation of fullness and believes this is due to her diabetes medication  Carly 5 MG/0.5ML pen       She continues with nausea daily and throughout the day typically improves with rest,  compazine (each AM) on days 1 and 2 post chemo and ondansetron 16m daily thereafter.   she has not taken this today and has been having  She has not vomited, diarrhea or constipation     fatigue 8-9/10 beginning in the 3 days following chemo, improved after a week then returns on and of for the days prior to next cycle.Notes improvement in breathing,  dry cough has resolved.  short of  breath continues with exertion. Denies wheezing or other respiratory symptoms.     Denies fever chills, chest pain, dizziness. Headaches, vision changes, rashes, sores  or sensitivity in the mouth. .      Continued chronic left hip pain 8/10 began 1 year and 2 months ago worse with  ambulation not aggravated by chemo. Hip pain improves following chemotherapy for a few days, with steroid administration States she has  Been trying to decrease the amount of aleve she was taking. She has started  taking aleve 2 tabs in am and at times 2 tabs in pm. Last cortisone injection was summer 2022 .Does feel the loratidine  10 mg daily helps with this. Planning for hip replacement after completion of chemotherapy.       Continues with significant numbness and tingling, burning sensation in the balls and toes of her feet bilaterally and occasionally in her hands since start of chemo. Has tried acupuncture X 3 does not feel there is a benefit. Had a foot massage once which was calming but did not help with neuropathy.  Gait remains unsteady due to her baseline hip pain.       Denies jaw pain (HX broken right lower jaw). She is no longer experiencing pain in the left  breast and feels that the mass has not changes since last visit.    Denies  other musculoskeletal symptoms including back pain or bone pain.  She denies swelling of her extremities.  She denies headaches.  She reports no cognitive changes, focal motor symptoms or new dermatologic symptoms. Continues to smoke. States she is quitting today, aware of tobacco cessation program through [facility]. May consider medical marijuana in the future.    Feels well enough to proceed with treatment today.     Would like to travel to [place] after surgery and is planning to pursue partial mastectomy, would like appt with surgery to discuss plan of care and to meet with radiation oncology.     ROS: As outlined above: comprehensive review of systems is otherwise negative.     Immunization  History   Administered Date(s) Administered    COVID-19 MRNA Bivalent Vaccine Booster Pfizer-Biontech 30 mcg/0.70m IM SUSP 10/25/2020    Covid-19 mRNA vaccine (PFIZER) IM 30 mcg/0.311m05/07/2019, 06/05/2019, 10/08/2019    DTaP(Infanrix) 08/01/2016, 09/25/2016, 11/22/2016    Hepatitis B Adult 08/01/2016, 09/25/2016, 02/21/2017, 08/15/2017    HiB(Acthib/Hiberix) 08/01/2016, 09/25/2016, 11/22/2016    IPV 08/01/2016, 09/25/2016, 11/22/2016    Influenza Historical [id number](IEPLU) 11/14/2017    Influenza Quadrivalent 0.1m61mrefilled syringe/single dose vial(FluLaval,Fluzone,Afluria,Fluarix) 12/15/2015, 09/25/2016    Meningococcal Conjugate MCV4O (Menveo) 08/01/2016    Pneumococcal 13-valent Conjugate (Prevnar 13) 08/01/2016, 09/25/2016, 11/22/2016    Pneumococcal Polysaccharide (Pneumovax) 02/21/2017, 08/15/2017    Zoster(Shingrix) 02/21/2017, 08/15/2017     She reports that she received annual flu vaccine and COVID updated booster on October 25, 2020.    PAST MEDICAL/SURGICAL HISTORY:   1. 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  Prior to bone marrow transplant she received 5 cycles of decitabine.  She received fludarabine, busulfan and and hydroxyurea for the transplant and received methotrexate for graft-versus-host prophylaxis.  2. Hypothyroid.  3. Anxiety.  4. Osteoarthritis of the hips, left greater than right.  5. Mild diverticulosis of the sigmoid colon.  6. History of atrial fibrillation and congestive heart failure in 2017 related to the bone marrow transplant.  7. Right cataract surgery.  8. Right rotator cuff surgery.    MEDICATIONS:     Current Outpatient Medications   Medication Sig    acyclovir (ZOVIRAX) 400 mg tablet Take 1 tablet (400 mg total) by mouth 2 times daily    metoprolol tartrate (LOPRESSOR) 50 mg tablet Take 1 tablet (50 mg total) by mouth 2 times daily     dexAMETHasone (DECADRON) 4 MG tablet Take 2 tablets (8 mg) by mouth once daily with food on days 2,3 and 4 of each chemotherapy cycle.    ondansetron (ZOFRAN) 8 mg tablet Take 1 tablet (8 mg total) by mouth every 8 hours as needed  Begin 72 hours after treatment if needed for nausea    prochlorperazine (COMPAZINE) 10 mg tablet Take 1 tablet (10 mg total) by mouth every 6 hours as needed (nausea)    ondansetron (ZOFRAN) 4 mg tablet Take 1 tablet (4 mg total) by mouth 3 times daily as needed (nausea)    cholecalciferol (VITAMIN D) 50 MCG (2000 UT) tablet Take 1,000 Units by mouth daily    levothyroxine (SYNTHROID, LEVOTHROID) 50 MCG tablet Take 50 mcg by mouth daily (before breakfast)    escitalopram (LEXAPRO) 20 MG tablet Take 20 mg by mouth daily    aspirin 81 MG EC tablet Take 81 mg by mouth daily     OBJECTIVE FINDINGS:    Most Recent Height, Weight, and Vital Signs (last recorded)  Height: 167.8 cm (5' 6.06") (11/24/2020 10:21 AM)  Weight: 115.6 kg (254 lb 13.6 oz) (11/24/2020 10:21 AM)  BMI (Calculated): 41.1 (11/24/2020 10:21 AM)  BSA (Calculated - sq m): 2.32 sq meters (11/24/2020 10:21 AM)  IBW in kg (Calculated) : 59.44 (11/24/2020 10:21 AM)  BP: 114/78 (12/08/2020 12:28 PM)  Heart Rate: 99 (12/08/2020 12:28 PM)  Temp: 36.4 C (97.5 F) (12/08/2020 12:28 PM)  Resp: 16 (11/24/2020 10:21 AM)  SpO2: 99 % (12/08/2020 12:28 PM)    Wt Readings from Last 10 Encounters:   11/24/20 115.6 kg (254 lb 13.6 oz)   11/17/20 117 kg (257 lb 15 oz)   10/27/20 117.4 kg (258 lb 13.1 oz)   10/06/20 120.3 kg (265 lb 3.4 oz)   09/30/20 119.5 kg (263 lb 7.2 oz)   09/23/20 119.5 kg (263 lb 7.2 oz)   09/15/20 120.2 kg (264 lb 15.9 oz)   09/09/20 119.9 kg (264 lb 5.3 oz)   09/02/20 120.4 kg (265 lb 6.9 oz)   08/26/20 120.7 kg (266 lb 1.5 oz)       PHYSICAL EXAMINATION:     General: Reveals a well-appearing woman. ECOG performance status is 1.    HEENT : grossly unremarkable.  Sclerae are anicteric. Conjunctivae are pink.  Oral  mucosa is without stomatitis or thrush.   Lymph nodes: There is no palpable cervical, supraclavicular, axillary or inguinal lymph node enlargement.   Chest: clear to auscultation and percussion.  PowerPort is present in the upper chest on the right no erythema, or edema  Cardiovacular: \regular rate and rhythm. No murmur    Breasts:   Left breast known  mass in the upper outer quadrant no longer palpable. No new lesions, masses or skin changes   Right breast  No visible or palpable masses, no edema or erythema  Abdomen: is soft,non tender  Bowel sounds are +  All quadrants.no hepatosplenomegaly, mass or ascites detected .   Extremities: are without edema.   Neurologic exam: is grossly nonfocal.   Skin: is dry  without rash, petechiae or ecchymoses. patches of vitiligo in the upper abdomen.    LABORATORY DATA:         Lab results: 12/08/20  1242 11/24/20  0941 11/17/20  1158 10/26/20  0740 10/05/20  0914 09/29/20  0744   WBC 5.8 0.6* 4.9 6.2 3.1* 3.5*   Neut # K/uL 3.7 0.1* 2.8 3.5 1.4* 2.0   Lymph # K/uL 0.9* 0.5* 1.1* 1.4 1.5 1.2   Hematocrit 28* 28* 28* 28* 28* 29*   Hemoglobin 8.7* 8.7* 8.3* 8.7* 9.4* 9.2*   MCV 106* 104* 109* 105* 100* 103*   Platelets 320 145* 323 303 208 239  Lab results: 12/08/20  1242 11/24/20  0941 11/17/20  1158 10/26/20  0740   Sodium  --  136 140 138   Potassium  --  4.8 4.7 5.2*   Chloride  --  99 103 101   CO2  --  26 24 24    UN 14 13 12 15    Creatinine 0.60 0.61 0.72 0.83   Glucose  --  138* 117* 132*   Calcium  --  9.7 9.6 10.1           Lab results: 12/08/20  1242 11/24/20  0941 11/17/20  1158 10/26/20  0740 10/05/20  0914   Total Protein  --  6.2* 6.3 6.8 6.6   Albumin  --  4.1 4.1 4.2 4.1   ALT  --  26 34 47* 50*   AST  --  18 29 32 33   Alk Phos  --  95 85 108* 84   Bilirubin,Total 0.4 0.6 0.5 0.4 0.5           IMAGING DATA: No new imaging data      ASSESSMENT:     Carly Higgins has now completed 4 cycles of paclitaxel and carboplatin, and 3 cycles of adriamycin and  cyclophosphamide. She presents today in anticipation of cycle 4 of  adriamycin and cyclophosphamide (cycle 8 of therapy plan). She continues to tolerate chemotherapy with some mild  Nausea without vomiting relieved with  typically improves with rest,  compazine (each AM) on days 1 and 2 post chemo and ondansetron 17m daily thereafter   She notes poor appetite, sensation of fullness over the last month with weight loss of 20 LB since September 2022. She attributes this to [facility] MG/0.5ML pen  for management of her diabetes. Moderate to high level of fatigue, and continuing neuropathy in hand and feet unchanged since she stopped taxane. She has continued chronic left hip pain with some improvement since starting loratadine 10 mg daily. We revisited limiting the use of NSAID's between treatments as  she is continuing to exceedthe maximum daily dose recommendations, taking 2 tabs in am and at times 2 tabs in the pm,  placing her at risk for GI bleeding, and  likelihood this could decrease her platelet count between treatments.  Her breast tumor appears to be responding to the chemotherapy and is no longer palpable on physical exam.      I have recommended that she continue with her final treatment of adriamycin and cyclophosphamide today and she is agreeable.    She will return for follow-up in 3 weeks for post treatment toxicity check. We have ordered imaging for surgical planning.    PLAN:     Clinical stage IIB triple negative left breast cancer   Start cycle 4 of doxorubicin and cyclophosphamide. This will consist of doxorubicin 60 mg/m and cyclophosphamide 600 mg/m on day 1 of each 21-day cycle.  Pegfilgrastim will be administered on day 2.   Continue to hold on immunotherapy with pembrolizumab in light of her previous allogenic bone marrow transplant.   Continue Loratadine 10 mg daily    Return for follow up in 3 weeks for toxicity check; blood work one day prior.    Surgical resection after  completion of neoadjuvant chemotherapy. patient plans to have partial mastectomy following neoadjuvant chemo (scheduled for follow up December 7,2022).   Left diagnostic mammogram and ultrasound, Left breast MRI with and without contrast ordered in anticipation of surgical planning.  2. Chemotherapy-induced nausea   ondansetron (ZOFRAN) 8 mg tablet Take 1 tablet (8 mg total) by mouth every 8 hours as needed Begin 72 hours after chemo treatment if needed for nausea    ondansetron (ZOFRAN) 4 mg tablet Take 1 tablet (4 mg total) by mouth 3 times daily as needed (nausea)    prochlorperazine (COMPAZINE) 10 mg tablet Take 1 tablet (10 mg total) by mouth every 6 hours as needed (nausea)    Considering referral to medical marijuana clinic    4. Chemotherapy-induced fatigue   Maintain physical activity   Maintain hydration    Recommend well balanced diet consisting of healthy foods to  help increase your energy levels include fruits, vegetables, whole-grain breads, low-fat dairy products, beans, lean meats, and fish.     5. Peripheral neuropathy   Did not receive benefit from acupuncture X 3   To begin Gabepentin as prescribed by PCP    6. Antineoplastic chemotherapy induced anemia   CBC prior to next cycle of chemo   Monitor for increasing shortness of breath, fatigue, weakness or dizziness    7. Left hip pain   Notes some improvement following chemotherapy with loratadine 10 mg daily   Continue to follow with ortho; planning for hip replacement after completion of chemotherapy   limit use of NSAID's ,not to exceed maximum daily dose from any source due to likelihood of decreased platelet count  between treatments and risk for GI bleed.     Discussed option of  taking tylenol extra strength 1000 mg up to every 6-8 hours (not exceeding recommended maximum daily dose) between NSAID medication.    May try Arnica gel. 1 dime size drop applied to left hip at area of focal tenderness upon rising in am. Do not  take NSAIDS within 2 hours of application.   Considering referral to medical marijuana clinic    8. Tobacco dependence   Ready to quit smoking   Referred to tobacco cessation program     9. Health maintenance    COVID-19     Patient has received the [place] COVID-19 vaccine, receiving the first dose on May 15, 2019, second dose on Jun 05, 2019  booster dose on October 08, 2019 and the COVID-19 MRNA Bivalent Vaccine Booster Pfizer-Biontech on  October 25, 2020.     Patient advised to continue to take precautions to reduce risk of contracting COVID-19, including social distancing, frequent handwashing and using a mask when in public.    Influenza    She reported annual flu vaccine on October 25, 2020.      Follow-up     Return for follow-up in 3 weeks for toxicity check with Dr. Isaiah Serge   Appointment with Radiation Oncology 12/13/20   Appointment with Surgical oncology 12/14/20    Patient was reminded to call the clinic at any time with any questions or concerns that may arise in the interval period    Patient complied with masking policy throughout the duration of the appointment.   Writer maintained use of mask  throughout  the duration of the appointment.   Writer was within 6 feet of patient for < 5 minutes due to the nature of the appointment.    I personally spent 45 minutes minutes on the calender day of the encounter, including pre and post visit work.    Dulce Sellar, NP    UX:[id number] Fredda Hammed, Higgins  Minus Liberty, Higgins  Assunta Curtis, Higgins

## 2020-12-08 NOTE — Progress Notes (Signed)
Pt presented for a port draw. Port accessed, labs sent per order. Port flushed with NS and an occlusive dressing was applied. Pt was discharged to her clinic appointment and has potential last treatment after clinic. Pt complied with masking policy throughout appointment. Writer maintained use of mask throughout appointment. Writer was within 6 feet for greater than 5 minutes due to the nature of the treatment.

## 2020-12-08 NOTE — Telephone Encounter (Signed)
Calling to let the team know Pt.'s MRI breast left with and without contrast was scheduled for 12/8 at 3:30pm arrival time. This is scheduled at [facility] [address]. Asking for a call if scan is not needed in Jan. Stated the Pt. Needs to be notified.

## 2020-12-08 NOTE — Progress Notes (Signed)
Patient presents for Adriamycin and Cytoxan infusions and Nobi. Labs, consent, assessment, and handoff, all within parameters to treat. Port previously accessed, continues to flush well with positive blood return. Pre medicated patient with Aloxi, Cinvanti, and Decadron as ordered. Adriamycin administered IV push via the side port of a free flowing NS line over 15 minutes, tolerated well. Cytoxan administered over 30 minutes, tolerated well. Port continues to flush well with positive blood return post-treatment. Port flushed with NS, instilled with heparin, and needle deaccessed.     Nobi placed on patient's left arm. Reviewed with patient signs of device malfunction, when device will begin administering, when device may be removed, and other reasons to call. Patient verbalized understanding. Patient discharged home in stable condition.    Patient complied with masking policy throughout duration of appointment. Writer maintained use of mask throughout duration of appointment. Writer was within 6 feet of patient for > 15 minutes due to the nature of the treatment.

## 2020-12-09 ENCOUNTER — Telehealth: Payer: Self-pay | Admitting: Surgical Oncology

## 2020-12-09 LAB — HEMOGLOBIN A1C: Hemoglobin A1C: 5.1 %

## 2020-12-09 NOTE — Telephone Encounter (Signed)
Carly Higgins is calling to speak with Allie in regards to appointments. Please call.

## 2020-12-09 NOTE — Telephone Encounter (Signed)
Spoke with patient to confirm that her scans scheduled in the month of December are the only scans she needs to worry about. I also let her know that the B&I appointment was cancelled as well. Patient appreciated the clarification and verbalized understanding.

## 2020-12-12 NOTE — H&P (Signed)
RADIATION ONCOLOGY CONSULTATION    Breast surgeon:  Minus Liberty, MD   Medical oncologist: Maple Mirza, MD    DIAGNOSIS:    Invasive Ductal Carcinoma of the left  upper outer breast,  Stage IIB   ER negative, PR negative, HER2 negative (IHC1+), Ki-67 70%, Grade 3   Clinical Stage:  CT2 cN0 cM0    HISTORY OF PRESENT ILLNESS:   [name] is a 60 y.o. 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  She presents for consultation regarding the role of adjuvant radiation therapy.  The patient was seen at [facility] unaccompanied.        Oncology History:  Carly Higgins's oncologic history was obtained through chart review and was discussed in summary with her.     1. February 2022: She experienced discomfort in the left breast.    2. May 18, 2020: Screening mammogram demonstrated an area of architectural distortion in the middle third of the left breast upper outer quadrant 2:00.  In the right breast, no suspicious masses, calcifications or other abnormalities are seen.  3. Jun 07, 2020: Diagnostic mammogram demonstrated an area of architectural distortion in the middle third upper outer quadrant of the left breast  and ultrasound demonstrated an irregular mass measuring 19 x 19 x 12 mm in the middle third upper outer quadrant of the left breast. Internal echotexture is hypoechoic. There is internal vascularity. It measures up to 2.5 cm radial.  BIRADS 4.  4. June 09, 2020: Ultrasound-guided core needle biopsy of left breast mass at 2:00 with Hydromark coil placed.  Pathology demonstrated grade 3 invasive ductal carcinoma.  ER/PR negative.  HER2 negative (1+ by IHC).  Ki 67 70%.  Post-biopsy mammogram demonstrated biopsy marker to be 1 cm beyond the expected  location of the finding.   5. June 20, 2020: bone scan negative for osseous metastatic disease.  6. June 22, 2020: CT Chest Abdomen Pelvis demonstrated ill-defined enhancing lesion of the lateral left breast measuring 1.7 x 1.2 cm axially and measuring 1.1 cm craniocaudally, asymmetrically prominent left axillary level 1 lymph nodes as described above that demonstrates enhancement of uncertain significance however could represent metastatic disease given the presence of a left breast neoplasm. No clearly suspicious pulmonary nodules identified.  No evidence of metastatic disease within the abdomen or pelvis.  7. June 28, 2020: MR breast demonstrated an irregular mass with spiculated margins, and associated susceptibility artifact consistent with a biopsy clip measuring 10 x 25 x 19 mm seen in the anterior of the left breast upper outer quadrant at 2 o'clock located 3 centimeters from the nipple.  In the right breast, there was no evidence of any mass or suspicious enhancements.   8. July 04, 2020: Ultrasound of left axilla demonstrated a suspicious prominent lymph node in the left axilla with a 46m cortex, for which ultrasound was recommended.  9. July 05, 2020: Ultrasound-guided biopsy of left axillary lymph node with Hydromark coil placement.  Pathology negative for metastatic carcinoma.   10. July 14, 2020: She was initiated on paclitaxel and carboplatin.  Completed fourth cycles September 30, 2020.  Started cyclophosphamide and doxorubicin October 06, 2020 and most recently had her fourth and final cycle on December 08, 2020.    Breast Cancer Risk Factors:  FAMILY  HISTORY:  Breast cancer in paternal aunt  Genetic Testing:  No, Result:  NA; Age at Menarche:  62; OCP Use:  Yes, 6 months - discontinued; Fertility Treatment:  No; G0, P0; Age at First Live Birth:  NA; Total months breastfeeding:  NA; Menopausal status:  postmenopausal.  Postmenopausal HRT:  No; BMI at diagnosis:  40.52 kg/m2; Mammographic density:   Scattered areas of fibroglandular densities.  H/O breast cancer:  No.  Prior thoracic radiation?  No     Carly Higgins feels well, but has neuropathy in her feet and has noticed her finger nails have thinned.  She quit smoking this past week.  She is looking forward to moving on to the next step of treatment and then going to [place] with family.    BREAST HISTORY:    The patient notes bra cup size D    ACTIVE PROBLEMS:  Patient Active Problem List   Diagnosis Code    MPN (myeloproliferative neoplasm) D47.1    Myeloproliferative disease D47.1    post allogenic MUD (F)  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 Z94.81    Immunocompromised D84.9    Hyperkalemia E87.5    Bilateral hip pain. M25.551, M25.552    A-fib I48.91    Cognitive impairment R41.89    IDC left 2 o'clock 2.5 cm, ER/PR- her 2- C50.412       PAST MEDICAL HISTORY:  Past Medical History:   Diagnosis Date    Atrial fibrillation     fib/flutter 05/2015    CHF (congestive heart failure)     pulmonary edema 05/28/15    GERD (gastroesophageal reflux disease)     Hypoxia 06/11/2015    Impaired mobility and ADLs 06/11/2015    Leukocytosis 09/22/2013    post allogenic MUD (F)  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    Conditioned w/ Busulfan 130 mg/m2/d and Fludarabine 40 mg/m2/d x4 days followed by 10/10 HLA MUD (F) Allogeneic PBSCT (pt/donor: ABO: A+/A+, CMV N/N)    Pulmonary edema     Shortness of breath        PAST SURGICAL HISTORY:  Past Surgical History:   Procedure Laterality Date    ROTATOR CUFF REPAIR Right        PRIOR ONCOLOGIC THERAPY:   Prior history of radiation therapy: No   Prior history of chemotherapy: Yes    Family history, medications, and allergies were reviewed.    SOCIAL HISTORY:    Social History     Socioeconomic History    Marital status: Divorced   Occupational History    Occupation: on disability   Tobacco Use    Smoking status: Former     Packs/day: 0.25     Years: 30.00     Pack years: 7.50     Types: Cigarettes     Quit date:  12/06/2020     Years since quitting: 0.0    Smokeless tobacco: Never    Tobacco comments:     Quit for 2 years after BMT in 2017   Substance and Sexual Activity    Alcohol use: Yes     Comment: occasional    Drug use: No    Sexual activity: Not Currently   Social History Narrative        Marital status: single    Lives in a second floor apartment alone     No children    Pets:  1 dog    Hobbies and interests include nothing at this time  Physical activity: none at this time     REVIEW OF SYSTEMS: As per history of present illness.    Review of Systems      Constitutional    [+] Weight loss (5-6 pound loss in the past few weeks d/t dietary   changes) / Malaise/Fatigue (fatigue 7/10)     [-] Fever / Chills / Diaphoresis    Skin    [-] Rash     Comments: No Lupus or scleroderma     HENT    [-] Headaches / Hearing loss / Tinnitus / Congestion / Sore throat    Eyes    [-] Blurred vision / Double vision     Comments: Reading glasses   Cardiovascular    [-] Chest pain / Palpitations     Comments: No pacemaker   Respiratory    [+] Shortness of breath (DOE r/t chemo)     [-] Cough / Wheezing    Gastrointestinal    [-] Heartburn / Nausea / Abdominal pain / Diarrhea / Constipation /   Vomiting    Genitourinary    [+] Urgency     [-] Dysuria / Frequency    Musculoskeletal    [+] Joint pain (left hip pain.)     [-] Neck pain / Back pain    Endo/Heme/Aller    [-] Easy bruising/bleeding     Comments: Has  diabetes and  Hypothyroidism.  Is anemic r/t chemo        Neurological    [+] Tingling (in feet and to a lesser extent in her fingers) / Dizziness   (lightheaded at times, relates to feeling shaky)     [-] Sensory change    Psychological    [-] Depression / Nervous/Anxious    Edited by:    Ciro Backer Tue Dec 13, 2020 12:49 PM      RADIATION CONCERNS/RISK FACTORS:     Previous radiation: No   Pacemaker/defibrillator/Implanted Cardiac Device: No   Autoimmune disease: No   Difficulty lying supine or prone:  No   Good range of motion both arms: Yes   Claustrophobia: Yes; ok with CT   Able to follow commands: Yes   Possibility of Pregnancy: No   Language(s):  English    PHYSICAL EXAMINATION:  BP 127/76 (BP Location: Left arm, Patient Position: Sitting, Cuff Size: large adult)    Pulse 93    Temp 37.1 C (98.8 F) (Temporal)    Wt 114.1 kg (251 lb 8.7 oz)    BMI 40.52 kg/m   ECOG: (1) Restricted in physically strenuous activity, ambulatory and able to do work of light nature  General:  Patient is a caucasian woman who is in no apparent distress.    HEENT:  Normocephalic and atraumatic.    Eyes: Extraocular movements were intact bilaterally.  No ptosis or nystagmus.    Chest/Pulmonary:  Patient exhibited a good respiratory effort, breathing comfortably on room air.  Breasts:  Exam deferred until after surgery.  Psychiatric:  Patients insight and judgement were appropriate.  Memory intact, and patient responds appropriately to questions and comprehends directions.    ASSESSMENT/PLAN:   #1 Invasive Ductal Carcinoma of the left  upper outer breast,  Stage IIB   ER negative, PR negative, HER2 negative (IHC1+), Ki-67 70%, Grade 3   Clinical Stage:  cT2 cN0 cM0  #2 Smoking cessation    Carly Higgins is a 60 y.o. female with cT2 cN0 triple negative invasive ductal carcinoma of the  left  upper outer breast status post neoadjuvant chemotherapy.  It was a pleasure to meet with her.  Together we discussed her clinical course and details of imaging and pathology.     She is planned to meet with Dr. Freda Munro tomorrow and will have updated imaging on 12/8 and 12/14. She is currently interested in having a lumpectomy.    I explained that radiotherapy recommendations will ultimately depend on what is found on final pathology.    We discussed that if she were to have a lumpectomy, whole breast irradiation would be recommended to reduce the chance of locoregional recurrence as well as breast cancer mortality.    In addition, we  discussed the possibility of a tumor bed boost to reduce the risk of recurrence based on the initial size, high grade, and triple negative tumor markers.  Whole breast radiation can take place over the course of 2 to 4 weeks.  We discussed the [place] Fast Forward Trial, which examined whole breast irradiation over the course of 1 week [26 Gy in 5 daily fractions] (with an optional additional 4-5 fraction boost) versus the [place] and [place] moderate hypofractionation trials [40.05 Gy in 15 fractions or 42.56 Gy in 16 fractions, respectively] (with an optional additional 4-5 fraction boost).  Oncologic outcomes are the same with these two regimens.  We are awaiting long-term cosmetic data, but we believe it will be similar between these regimens.    I explained that clinically she does not have evidence of lymph node involvement; however sometimes despite clinical node negativity, involved lymph nodes are discovered pathologically. If there was evidence of lymph node involvement on pathology, comprehensive nodal irradiation would also be recommended.  If we are treating lymph nodes comprehensively, radiotherapy would range from 4-6 weeks; however as she is not planning for a mastectomy with reconstruction, I would favor a 4 week moderately hypofractionated regimen.    We discussed the potential short-term adverse effects of breast radiotherapy including but not limited to fatigue, increased nipple sensitivity, erythema, pruritis, tenderness, and desquamation of the breast.  We also discussed potential long-term adverse effects including hyperpigmentation, change in appearance of size of the irradiated breast, fibrosis, telangiectasias, lymphedema, rib fracture, plexopathy, and the low risk of secondary malignancy or cardiopulmonary effects.     We then discussed the simulation process for radiation planning. Exact treatment position will depend upon final pathology and if we need to treat regional nodes.    Carly Higgins is  hoping to go to [place] in between surgery and radiation and following radiation.  I explained that it will take a few weeks to have her final pathology post-surgery.  I will plan to see her back for a follow up and radiation planning scan 4-6 weeks post-surgery to allow for healing and for final pathology to return.  This timeline could change if there is a need for re-excision.  Approximately 2 weeks after the radiation planning scan, daily radiotherapy (likely 2-4 weeks of treatment Monday through Friday) will begin.     All questions were addressed to the patient's satisfaction, and she knows she can contact our department with further questions or concerns.     I had a greater than 45 minute face-to-face discussion with her, 95% of which was for counseling and coordination of care.  I personally spent 75 minutes on the calendar day of encounter, including pre and post visit work.

## 2020-12-12 NOTE — Progress Notes (Signed)
Carly Higgins is a 60 y.o. female who is seen today for initial consultation in Radiation Oncology with Dr. Rudi Rummage to discuss the role of radiation therapy for breast cancer.  She is unaccompanied.    History Relevant to Visit:    GYNECOLOGIC HISTORY:    Menarche:  Age 85    G0 P0   Hormonal Contraceptives?:  Yes    OCP:  6 months, Discontinued in the past   She had a natural chemotherapy induced menopause at age 79   She never used HRT    An annual screening GYN exam was years ago   Evaluated for infertility?:  No       Uterus/ovaries intact?:  Yes      Previous breast problems: no    Heritage:  [place]    Genetic testing - no   was not eligible due to history of allogenic BMT    Information significant for Radiation Therapy  Previous Radiation Therapy: - no  Previous Chemotherapy: - yes  Implanted Cardiac Device:  - no  Implanted insulin pump: - no  Autoimmune Disease:  - no  Comfortable in Supine position yes,  Prone position yes  Claustrophobic  yes  Ok with CT  Arm ROM - good  Bra Cup size -  D      Oncology History Overview Note   Carly Higgins is status post 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    In February 2022 she began to experience discomfort in the left breast.  She first attributed this to having been struck with a part of her dog when she picked it up about 1 week earlier.  She continued to experience discomfort intermittently. She scheduled a screening mammogram, having not had an exam since July 2018.         MPN (myeloproliferative neoplasm)   01/19/2014 Initial Diagnosis    MPN (myeloproliferative neoplasm)     07/15/2020 -  Chemotherapy     Began cycle 1 of weekly paclitaxel and carboplatin on July 15, 2020.  Immunotherapy with pembrolizumab was not recommended due to her previous allogenic bone marrow transplant.   Final cycle (4) of paclitaxel and carboplatin was administered on September 30, 2020.   Began dose dense cyclophosphamide and doxorubicin on October 06, 2020    OP Breast CARBOplatin Weekly Paclitaxel then [facility]   Plan Provider: Beverely Pace, MD  Treatment goal: Neoadjuvant  Line of treatment: [No plan line of treatment]     IDC left 2 o'clock 2.5 cm, ER/PR- her 2-   05/18/2020 Significant Radiology Findings    Screening [place]: scattered tissue, architectural distortion left 2 o'clock. BIRAD 0       06/07/2020 Significant Radiology Findings    Ultrasound [place]: irregular hypoechoic mass left 2 o'clock, 19x19x12 mm, 25 mm radial. middle third upper outer quadrant of the left breast. BIRAD 4  Ultrasound guided core IDC     06/09/2020 Procedure    ultrasound-guided core biopsy of the left breast 2:00 lesion:   Biopsy revealed invasive ductal carcinoma.  Provisional combined Nottingham grade was 3 of 3 (tubule formation score 3, nuclear grade 3, mitotic rate score 2).  Tumor cells were negative for estrogen receptors and progesterone receptors.  Tumor cells were negative for HER2 by immunohistochemical staining with a staining score of 1+.  Ki-67 staining was seen in approximately 70% of tumor cells.  06/13/2020 Consultation    She was seen in the Hereditary Oncology clinic :  Because of her prior allogeneic bone marrow transplant, she was not eligible for standard germline testing with blood or saliva specimen.       06/15/2020 Initial Diagnosis    IDC left 2 o'clock 2.5 cm, ER/PR- her 2-     06/15/2020 Cancer Staged    Staging form: Breast, AJCC 8th Edition  - Clinical stage from 06/15/2020: Stage IIB (cT2, cN0, cM0, G3, ER-, PR-, HER2-)     06/15/2020 Consultation    Surgical consultation:  Dr. Freda Munro      06/20/2020 Significant Radiology Findings    Whole-body bone scan : No abnormal focus of radiotracer uptake to suggest osseous metastatic disease.     06/22/2020 Significant Radiology Findings    CT of the chest: mill-defined enhancing lesion in the lateral left breast measuring 1.7 x  1.2 x 1.1 cm.  There were asymmetrically prominent level 1 left axillary lymph nodes, enhancement of uncertain significance, possibly representing metastatic disease. No enlarged mediastinal, hilar, intramammary,  supraclavicular lymph nodes or suspicious pulmonary nodules were identified.       06/22/2020 Significant Radiology Findings    CT of the abdomen pelvis : no evidence of metastatic disease . small fat filled umbilical hernia noted.     06/28/2020 Significant Radiology Findings    MRI of the breast: irregular mass with spiculated margins measuring 10 x 25 x 19 mm in the left breast upper outer quadrant at the 2 o'clock position located 3 cm from the nipple.      07/15/2020 -  Chemotherapy     Began cycle 1 of weekly paclitaxel and carboplatin on July 15, 2020.  Immunotherapy with pembrolizumab was not recommended due to her previous allogenic bone marrow transplant.   Final cycle (4) of paclitaxel and carboplatin was administered on September 30, 2020.   Began dose dense cyclophosphamide and doxorubicin on October 06, 2020    OP Breast CARBOplatin Weekly Paclitaxel then [facility]   Plan Provider: Beverely Pace, MD  Treatment goal: Neoadjuvant  Line of treatment: [No plan line of treatment]     08/18/2020 Procedure    PowerPort in the right internal jugular vein.           BP 127/76 (BP Location: Left arm, Patient Position: Sitting, Cuff Size: large adult)    Pulse 93    Temp 37.1 C (98.8 F) (Temporal)    Wt 114.1 kg (251 lb 8.7 oz)    BMI 40.52 kg/m     Pain    12/13/20 1222   PainSc:   0 - No pain       Karnofsky Performance Status: 80% - Normal activity with effort, some signs or symptoms of disease    Fatigue: 7 - Severe fatigue    Safety/Protective Mechanisms  Two Patient Identifiers Confirmed?: Yes    Fall Risk  RN assessed patient for risk of falls: Yes  Criteria for Risk of Falls: (!) Unsteady Gait  Patient at Risk for Falls: (!) Yes  Fall Risk Interventions: Patient assisted on/off exam tables, Patient  educated on prevention of falls      Past Medical History  Past Medical History:   Diagnosis Date    Atrial fibrillation     fib/flutter 05/2015    CHF (congestive heart failure)     pulmonary edema 05/28/15    GERD (gastroesophageal reflux disease)     Hypoxia 06/11/2015  Impaired mobility and ADLs 06/11/2015    Leukocytosis 09/22/2013    post allogenic MUD (F)  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    Conditioned w/ Busulfan 130 mg/m2/d and Fludarabine 40 mg/m2/d x4 days followed by 10/10 HLA MUD (F) Allogeneic PBSCT (pt/donor: ABO: A+/A+, CMV N/N)    Pulmonary edema     Shortness of breath        Past Surgical History:  Past Surgical History:   Procedure Laterality Date    ROTATOR CUFF REPAIR Right        Medications:  Current Outpatient Medications on File Prior to Visit   Medication Sig Dispense Refill    MOUNJARO 5 MG/0.5ML pen SMARTSIG:5 Milligram(s) SUB-Q Once a Week      GABAPENTIN 300 MG capsule Take 300 mg by mouth daily      acyclovir (ZOVIRAX) 400 mg tablet Take 1 tablet (400 mg total) by mouth 2 times daily 180 tablet 3    metoprolol tartrate (LOPRESSOR) 50 mg tablet Take 1 tablet (50 mg total) by mouth 2 times daily 120 tablet 3    ondansetron (ZOFRAN) 8 mg tablet Take 1 tablet (8 mg total) by mouth every 8 hours as needed  Begin 72 hours after treatment if needed for nausea 20 tablet 5    prochlorperazine (COMPAZINE) 10 mg tablet Take 1 tablet (10 mg total) by mouth every 6 hours as needed (nausea) 30 tablet 5    ondansetron (ZOFRAN) 4 mg tablet Take 1 tablet (4 mg total) by mouth 3 times daily as needed (nausea) 60 tablet 3    cholecalciferol (VITAMIN D) 50 MCG (2000 UT) tablet Take 1,000 Units by mouth daily      levothyroxine (SYNTHROID, LEVOTHROID) 50 MCG tablet Take 50 mcg by mouth daily (before breakfast)      escitalopram (LEXAPRO) 20 MG tablet Take 20 mg by mouth daily  5    aspirin 81 MG EC tablet Take 81 mg by mouth daily      dexAMETHasone (DECADRON) 4 MG tablet Take 2 tablets  (8 mg) by mouth once daily with food on days 2,3 and 4 of each chemotherapy cycle. (Patient not taking: Reported on 12/13/2020) 24 tablet 0     No current facility-administered medications on file prior to visit.       Allergies:  No Known Allergies (drug, envir, food or latex)    Family History:  Family History   Problem Relation Age of Onset    No Known Problems Mother     Throat cancer Father     No Known Problems Sister     No Known Problems Brother     Breast cancer Paternal Aunt     Ovarian cancer Neg Hx        Social History:  Social History     Socioeconomic History    Marital status: Divorced   Tobacco Use    Smoking status: Former     Packs/day: 0.25     Years: 30.00     Pack years: 7.50     Types: Cigarettes     Quit date: 12/06/2020     Years since quitting: 0.0    Smokeless tobacco: Never    Tobacco comments:     Quit for 2 years after BMT in 2017   Substance and Sexual Activity    Alcohol use: Yes     Comment: occasional    Drug use: No    Sexual activity: Not Currently   Social  History Narrative        Marital status: single    Lives in a second floor apartment alone     No children    Pets:  1 dog    Hobbies and interests include nothing at this time    Physical activity: none at this time       Review of Systems  As per HPI  Review of Systems      Constitutional    [+] Weight loss (5-6 pound loss in the past few weeks d/t dietary   changes) / Malaise/Fatigue (fatigue 7/10)     [-] Fever / Chills / Diaphoresis    Skin    [-] Rash     Comments: No Lupus or scleroderma     HENT    [-] Headaches / Hearing loss / Tinnitus / Congestion / Sore throat    Eyes    [-] Blurred vision / Double vision     Comments: Reading glasses   Cardiovascular    [-] Chest pain / Palpitations     Comments: No pacemaker   Respiratory    [+] Shortness of breath (DOE r/t chemo)     [-] Cough / Wheezing    Gastrointestinal    [-] Heartburn / Nausea / Abdominal pain / Diarrhea / Constipation /   Vomiting     Genitourinary    [+] Urgency     [-] Dysuria / Frequency    Musculoskeletal    [+] Joint pain (left hip pain.)     [-] Neck pain / Back pain    Endo/Heme/Aller    [-] Easy bruising/bleeding     Comments: Has  diabetes and  Hypothyroidism.  Is anemic r/t chemo        Neurological    [+] Tingling (in feet and to a lesser extent in her fingers) / Dizziness   (lightheaded at times, relates to feeling shaky)     [-] Sensory change    Psychological    [-] Depression / Nervous/Anxious    Edited by:    Ciro Backer Tue Dec 13, 2020 12:49 PM          PLAN: to be determined by Dr. Victorino December, RN  12/13/2020     Patient  complied with masking policy throughout the duration of the appointment.   Writer maintained use of mask and faceshield/eyewear throughout  the duration of the appointment.   Writer was within 4-5 feet of patient for < 15 minutes due to the nature of the

## 2020-12-13 ENCOUNTER — Ambulatory Visit: Payer: Medicare Other | Attending: Radiation Oncology | Admitting: Radiation Oncology

## 2020-12-13 ENCOUNTER — Other Ambulatory Visit: Payer: Self-pay

## 2020-12-13 ENCOUNTER — Encounter: Payer: Self-pay | Admitting: Radiation Oncology

## 2020-12-13 VITALS — BP 127/76 | HR 93 | Temp 98.8°F | Wt 251.5 lb

## 2020-12-13 DIAGNOSIS — Z171 Estrogen receptor negative status [ER-]: Secondary | ICD-10-CM | POA: Insufficient documentation

## 2020-12-13 DIAGNOSIS — C50412 Malignant neoplasm of upper-outer quadrant of left female breast: Secondary | ICD-10-CM | POA: Insufficient documentation

## 2020-12-14 ENCOUNTER — Encounter: Payer: Self-pay | Admitting: Surgical Oncology

## 2020-12-14 ENCOUNTER — Ambulatory Visit: Payer: Medicare Other | Attending: Surgical Oncology | Admitting: Surgical Oncology

## 2020-12-14 VITALS — BP 114/57 | HR 110 | Temp 95.9°F | Wt 253.1 lb

## 2020-12-14 DIAGNOSIS — Z01818 Encounter for other preprocedural examination: Secondary | ICD-10-CM | POA: Insufficient documentation

## 2020-12-14 DIAGNOSIS — C50412 Malignant neoplasm of upper-outer quadrant of left female breast: Secondary | ICD-10-CM | POA: Insufficient documentation

## 2020-12-14 DIAGNOSIS — Z171 Estrogen receptor negative status [ER-]: Secondary | ICD-10-CM | POA: Insufficient documentation

## 2020-12-14 NOTE — Patient Instructions (Addendum)
Pre-Operative Instructions [facility]   [place], [place] [address]         Your surgery is scheduled for ___1/17/23________.    PRIOR TO SURGERY    You need a COVID test 3-5 days before surgery. Please go on __1/13/23 or 1/14/23______ for testing.    [facility] Ambulatory Pre-Procedure Testing     [address]   [place] [address]   Monday - Friday 7:30 a.m. - 5 p.m.   Saturday - Sunday 8 a.m. - 12 p.m.   Phone: [phone]     Five days before surgery, please STOP taking if surgeon does not specify:     Anti-inflammatory medications (Ibuprofen, Motrin, Advil, Mobic, Meloxicam, Aleve, Naproxen, Voltaren, etc.)   Vitamins and herbal supplements, including herbal teas    YOU MAY TAKE ACETAMINOPHEN (TYLENOL) as needed    Hold Aspirin for 7 days before surgery.    FOLLOW YOUR SURGEON'S INSTRUCTIONS IF DIFFERENT THAN ABOVE.    DAY BEFORE SURGERY     Staff from [facility] will call you by 6pm the evening before the surgery to inform you of your arrival time.  The call will be Friday afternoon if your surgery is on a Monday. If you have not received a phone call by 6pm, please phone [phone] and ask to be transferred to the [facility].   Please arrange for transportation to and from the hospital. You must have a responsible adult to stay with you after your surgery.   Do not eat anything after midnight the night before your surgery.    Clear liquids are encouraged from midnight until 2 hours before your scheduled surgery. Examples include: Gatorade, clear apple, or water.   If you have been instructed, please perform your skin prep as directed.                                                                                         DAY OF SURGERY    DO NOT CONSUME FOOD OF ANY KIND.   Only Gatorade, clear apple juice or water are encouraged from midnight until 2 hours before your scheduled surgery.   Please bring photo identification and your insurance card with you for  registration.   If you are female, please be prepared to provide a urine sample on arrival to the preoperative area unless you are one year post menopause or have had a hysterectomy.    DO NOT WEAR: JEWELRY, MAKEUP, DARK NAIL POLISH, HAIR PINS, BODY LOTION OR SCENTS.     Please understand that rings and body piercings must be removed prior to surgery. If they are not removed your surgery is at risk of being delayed or cancelled. Please see a jeweler before your surgery if you cannot remove an item yourself   Please do not bring valuables/electronics/cash with you on the day of surgery.  [facility] is not responsible for loss/damage/theft of these items.  Thank you.   If wearing eyeglasses, please bring a case.    DO NOT WEAR CONTACT LENSES.   You may  shower, brush your teeth   If you have been instructed, please perform your skin prep as directed.    PLEASE BRING YOUR CPAP MACHINE INTO THE PREOPERATIVE AREA TO BE CHECKED IF YOU ARE STAYING OVERNIGHT, OTHERWISE YOU MAY LEAVE IT IN YOUR VEHICLE.Marland Kitchen   Please bring any medical supplies or equipment requested by your surgeon (Example: Back/neck brace).   Patients are permitted 2 visitors at a time.   All visitors must be 62 yrs of age or older.    MEDICATIONS: DAY OF SURGERY     Take your medications with a as directed on the medication list.   Anxiety and pain medications may be taken as prescribed at any time prior to arrival.    Bring your inhalers and eye drops on the day of surgery to be used as prescribed.   Medications from the [facility] will be administered to you under the direction of your surgeon. Please leave your prescriptions at home.    DIABETICS: ON THE DAY OF SURGERY     Do not take oral diabetes medications.   DO NOT TAKE ANY REGULAR OR HUMALOG INSULIN (THIS INSTRUCTION DOES NOT APPLY TO INSULIN PUMPS).    Take half the dose of NPH insulin.   Take Lantus or Levemir insulin as usual.   If you are diabetic and feeling symptomatic,  you may take sugar containing clear liquids after midnight the night before your surgery if your glucose level is less than 70.  Please limit your intake of these clear liquids to 8 ounces if at all possible.   If you are still feeling symptomatic, you may report to the [facility] early and report your symptoms to anesthesia personnel.   Please bring backup supplies if you are currently using an insulin pump.  Thank you.    [facility] [place] in the Main Ramp garage.  Enter the building through the [employer].  Please stop at the Information Desk so that they may direct you to [facility] [address].   Leave your belongings in the car (except for your CPAP) and your visitors may bring them to your room after surgery.    [place]    As a convenience, prior to your discharge we will provide any discharge medications you require.     The pharmacy is open from 9am to 5:30pm on weekdays and 10am-2pm on Saturdays.      If you will be alone on the day of surgery and want to leave with your prescribed medication, you may:             Bring a check made out to [facility]           Use a credit card to pay for your prescription           Have your family call the pharmacy with a credit card number           Only bring cash to the hospital as a last resort. Prescriptions cannot be filled without payment. Thank you for your consideration.    QUESTIONS?     Question about these instructions? Call [phone], select option 2, and leave a message for a nurse to return your call.   Any questions regarding specifics about your surgery or recovery? Please call your surgeons office.  [facility] at [facility]  Telephone: [phone]    Partial Mastectomy with or without Sentinel Lymph Node Biopsy  Postoperative Instructions    Wound Care:    The wound is sealed with tissue glue. Do not pick at the glue. Do not put any creams, lotions or  ointments over the glue. It will begin to peel off on its own after 1-2 weeks, at which point you can pull it off.     You may shower beginning the day after your surgery. Pat your incision(s) dry afterwards. Do not soak your incision(s) in a tub, hot tub, or pool for 7 days after your surgery.      The stitches are all under the skin and will dissolve on their own. They do not need to be removed.     You may benefit from wearing a sports bra or snuggly fitting bra around the clock for the first 5-7 days. This will compress the breast and may keep you much more comfortable.     Avoid jogging, aerobics or other strenuous activity for 2 weeks to allow the breast to heal. Walking or exercising on a stationary bicycle is permitted. (In other words, do not take part in activities that shake up your breast too much. It could cause bleeding and pain). If you had a sentinel node biopsy, do not do any heavy (anything heavier than a gallon of milk) lifting or vigorous upper body exercise for 2 weeks to allow the incision in the armpit to heal.     If you had a sentinel node biopsy and blue dye was used, you may note a green or blue color to your urine or bowel movements for a day or 2. This is normal and will go away on its own.    Postoperative Pain Management:   You should not have a lot of pain. The block you received at the time of surgery should keep you comfortable overnight.     You will not be given a prescription for pain medications when you leave the hospital. Use Tylenol or Ibuprofen (Advil, Aleve, Motrin, etc) as needed for pain.Good support and ice packs will help minimize the discomfort.    When to call:   Call if you have bleeding, severe pain, swelling, redness around the wound, or fever.     Call if you have any questions.    Call to make an appointment to see me in the office in one week for a check of your wound and to discuss the results of your biopsy.           Sentinel Node Biopsy/Nuclear Medication  Injection  The sentinel node/s are the lymph nodes that the breast drains to first, and are the first lymph nodes to which cancer cells are most likely to spread from a primary breast tumor. Usually, there is more than one sentinel lymph node.  What is Sentinel Lymph Node Biopsy?  A sentinel lymph node biopsy (SLNB) is a procedure in which the sentinel or guardian lymph nodes are identified, removed, and pathologically examined to determine whether cancer cells are present. It is thought that if breast cancer cells were to escape into the lymphatic system, they would travel to the sentinel nodes before moving on to other nodes and the sentinel lymph node (SLN) technique is based upon the observation that tumor cells migrating from a primary tumor metastasize to one or a few lymph nodes before involving other lymph nodes.  How are  the sentinel lymph nodes identified?  Sentinel Node Imaging: Lymphatic mapping is performed the day before, or on the morning of surgery within the nuclear medicine department. A small amount of radioactive material is injected into the breast (injection of the radioactive isotope sometimes stings.)  A radioactive substance is injected near the tumor. The injected material is located visually and/or with a device that detects radioactivity during the surgery. The sentinel node(s) (the first lymph node(s) to take up the material) is (are) removed and checked for cancer cells.          Wire Localization    What is Wire Localization?   Sometimes an abnormality detected on a mammogram or ultrasound cannot be biopsied using techniques offered by the breast center--it may require a surgical biopsy. Wire localization, also known as needle localization, is used to locate the lesion for the surgeon prior to the surgical biopsy. It is performed the same day as a surgical breast biopsy.     What Will the Procedure Be Like?   For you wire localization appointment, you should plan on being in the  center for about an hour. The procedure itself will only take about 20 to 30 minutes. The breast abnormality is located using either mammography or ultrasound techniques. If mammography is used, you will be sitting for the exam. If ultrasound is used, you will be lying still on your back.     Once the abnormality is located, a radiologist will clean the area and inject a local anesthetic into the skin and deeper tissues to numb the area. Some women experience minimal discomfort during this procedure. Others experience no discomfort at all. Using mammography or ultrasound, the radiologist will use a small needle to insert a guide wire next to the area of concern.     Once the wire is in place, a mammogram will be performed to document the position of the wire.  The wire is designed to be held securely in place until your surgical procedure is performed.     What Do I Need To Do Before My Procedure?  Be sure to follow any surgical prep guidelines as instructed by your surgeon. Please wear a comfortable, loose fitting two-piece outfit as you will be asked to remove your top prior to the exam. A soft robe will be provided for your comfort. Please refrain from wearing any powder, perfumes, deodorant and/or lotions on your underarms and breasts prior to the procedure. A moist towelette will be provided if you need it.           You are at risk for developing arm swelling due to the surgery on your lymph nodes. People at-risk for developing lymphedema should consider the following actions and precautions:     1. Medical check-ups: Medical check-ups or screening for lymphedema is recommended.     2. Reporting changes: Report to your health care provider any change in your at-risk body part, such as increase in size, change in sensation, color, temperature, or skin condition. Diagnosing and treating the onset of lymphedema at the earliest possible time improves outcomes.     3. Body weight: Obesity is known to be a major  risk factor for lymphedema. A person at risk for lymphedema should maintain a normal body weight and seek professional help to lose weight if their weight is above standard recommended guidelines. People who do not know their recommended weight for age and height should seek information from a health care provider.  4. Exercise: Follow recommendations on exercise for lymphedema, as specific forms of exercise may reduce the risk of developing lymphedema. Incorrect or unsafe exercise may increase the risk of lymphedema.     5. Compression garments: Consider the pros and cons of wearing compression garments during air travel before making a decision (see controversies below). Wearing compression garments during exercise is probably unnecessary unless you have noticed that swelling occurs during exercise, if exercise is more intense than usual, or in cases of significant over-activity. There is no clear guidance on this area of risk reduction; discuss your concerns with a lymphedema provider.     6. Infections (cellulitis): Treat all episodes of cellulitis (infection in subcutaneous tissue) in the at-risk body part as an urgent medical situation. Know the signs and symptoms of cellulitis skin infection in an area of impaired lymph drainage (signs may include redness, warmth, pain, fever, and feeling of overall illness or flu-like symptoms). Cellulitis episodes in the at-risk body part may lead to the onset of lymphedema.     7. Skin care: Maintain skin in good condition with proper hygiene and regularly use a moisturizer to avoid skin cracking.     8. Trauma: Avoid trauma to the at-risk area. Trauma includes any situation that might typically cause swelling in a person without lymphedema, but may lead to prolonged swelling in the area of impaired lymphatic drainage.    a. Protect against falls, fractures, and serious burns. If any of these occur, perform first aid or seek emergency care as appropriate. After the  emergency is controlled, if there is prolonged edema, contact a lymphedema provider.    b. If required to have venipuncture, inform the phlebotomist of your lymphedema risk and use a not-at-risk limb if possible. If not possible, inform the phlebotomist of your lymphedema risk condition and ask forthe most experienced phlebotomist. Do not allow multiple or traumatic searches for veins, which can increase tissue edema. If a traumatic venipuncture on an at-risk lymphedema extremity occurs, immediately wash the area, apply a cold pack, and then elevate until edema subsides. If it does not subside in 24 hours, contact your lymphedema provider.    c. For scratches, punctures, breaks in the skin of the at-risk body part, wash with soap and water, pat dry, then apply a topical antibacterial.    d. Wear non-constricting protective gear over the a- risk body part when doing an activity that could lead to puncture or trauma, e.g., shoes and socks, protective gloves or sleeves when gardening or working with animals that scratch or bite. Ask your lymphedema provider for specifics for your lymphedema.     e. Consider having nail care done by professionals, especially toenails. For people at-risk for arm lymphedema, good hand hygiene and softening the cuticles with  proper cuticle moisturizer is recommended. Be careful with manicures; use clean instruments and avoid cutting cuticles.     9. Constriction: Avoid excessive or prolonged constriction of the at-risk body part. Excessive constriction refers to tightening or squeezing in a manner that restricts lymph flow through that area or causes tissue trauma. Examples of excessive constriction include improperly fitting compression garments and clothing (tight sleeves on a lymphedema arm, tight stockings on a lymphedema leg, tight bra, or excessive pressure from an underwire on chest or breast lymphedema). Clothing and garments should be supportive and  have smooth compression.  Blood pressure cuffs used improperly or with extreme pressure may excessively constrict tissues (see controversies below).     10. Heat  and cold: Avoid exposure to extreme heat or cold to the extent that tissue injury could occur, such as burn or frostbite (see controversies below).     11. Surgery: If you need to have surgery on an area at risk for lymphedema, inform your surgeon of your risk for lymphedema. Ask your surgeon how long swelling is usually present after your type of surgery. You may wish to meet with a lymphedema provider prior to your surgery to have a post-operative lymphedema care plan in case of prolonged or excessive post-operative swelling. After surgery, if you experience prolonged or excessive swelling, notify your surgeon and request a referral to a lymphedema provider.     12. Air Travel: Air travel is associated with a risk of venous thromboembolism (blood clot in veins, or VTE) on long flights for people with and without lymphedema. The risk of VTE from long-haul air travel is believed to be caused by low cabin pressure combined with lowered oxygen levels, dehydration, and lack of movement. It is not known whether people at risk for lymphedema have more risk of VTE than the general population. The risk of precipitating the onset of lymphedema during air travel for people with lymphedema is unclear (see controversies below). It is recommended that people with lymphedema review the pros and cons of wearing prophylactic compression during air travel (detailed in the controversies section of this paper) and make an informed personal decision. Regardless of the use of prophylactic compression, it is important to move around, exercise the at-risk body part, and maintain good hydration during air travel.    Controversies regarding risk reduction practices:     1. Air travel: There is little evidence that lymphedema is caused or worsened by air travel. There is a theoretical risk of swelling in  the at-risk area on an airplane because of reduced cabin pressure. Stasis, or lack of movement, can cause swelling or venous blood clots during air travel for people with or without lymphedema. There are isolated case reports of people with or at risk for lymphedema who have developed swelling after air travel. One study showed that physically fit women at risk for breast cancer-related lymphedema had no increase in swelling from air travel. Another study showed that prophylactic compression had the potential to make swelling worse. We cannot specifically recommend or not recommend compression garments for prophylaxis in at-risk people who have not yet developed lymphedema. People at risk for lymphedema who decide to wear prophylactic compression on airplanes should work with an experienced garment fitter and should not self-purchase a garment. The person who chooses to wear prophylactic compression on an airplane should wear the garment several times prior to air travel to make sure the garment fits well and has no areas of constriction. If, while wearing a garment on an airplane, the swelling increases or the garment constricts, remove it immediately. It is recommended that people with a confirmed diagnosis of lymphedema wear properly fitting compression garments for air travel.     2. Blood pressure cuffs: Studies have not determined the actual risk of having BP taken on the at-risk arm. Some feel that an isolated, low-pressure, and brief BP assessment is unlikely to cause or worsen lymphedema. Some authors have claimed that because compression is used for lymphedema treatment, BP cuffs and air compression devices are safe. This is an erroneous conclusion due to the following considerations:     a. Pneumatic compression devices used for lymphedema treatment are sequential, gradient compression;  b. BP cuffs and tourniquets are high pressure focal compression that can lead to excessive constriction if not  properly used;     c. User-error with high-pressure BP machines repetitively cycling is quite different from a hand BP device pumped up to just a little higher than the usual upper level BP. Because lymphedema is a serious and progressive condition, if possible, use an uninvolved or not-at-risk extremity when taking blood pressure. In doctors offices or hospitals, where machine BPs are regularly taken, the patient can request a hand BP measurement and have the medical provider only pump the cuff to just a little above the usual BP, thereby avoiding repetitive pumping or painful squeezing.     3. Mammograms: There is no evidence that mammograms cause or worsen breast lymphedema. If you have concerns about breast tenderness, swelling, or soreness after a mammogram on a breast with or at risk for lymphedema, discuss the issue with your radiology technician or health care provider.     4. Razors: There is no evidence that shaving with a clean razor on clean skin causes or increases lymphedema. However, a common sense approach is as follows: when shaving an area with no feeling or that you cannot see, be very cautious and watch what you are doing directly or in a mirror. Do not shave dry skin or use rusty razors that can cause trauma to skin. Do not shave areas of severe lymphedema that have large skin folds, wounds, or deep creases.     5. Heat and cold: There is conflicting evidence on the risk of excessive heat or cold and lymphedema. Based on one survey study of gynecologic cancer survivors, legs may be more at risk than arms with exposure to heat.15 Individuals should use common sense, proceed very cautiously when using heat or cold therapy, and limit the length of exposure until you know the response of your at-risk body part. Monitor closely the effect of any change in environmental condition on your at-risk body part, and stop if there is increased swelling from exposure to extreme heat or cold. There is a  theoretical risk of worsening the lymphedema if he heat or cold is extreme or long enough to cause tissue damage. There is a theoretical risk of immersion moist heat (sauna, hot tub) when done to the point of raising body temperature. Topical heat may or may not have a positive or negative effect on lymphedema.     6. Hospitals, doctors offices and medical facilities: Medical facilities should have a written policy regarding the at-risk limb. Since many patients have varying levels of risk for lymphedema that cannot easily be determined, the facility should make a reasonable attempt to protect any limb the patient identifies as being at risk for lymphedema. Some medical professionals are unfamiliar with lymphedema and might not take reasonable precautions unless there is a policy. The ability of phlebotomists and professionals using tourniquets or BP machines varies. It is reasonable for an individual with or at risk of lymphedema to have his or her concern properly addressed by health care professionals and facilities. In a medical emergency or when there is no uninvolved limb, health care facilities should address the medical priority, but take reasonable precaution with venipuncture and BP limb constriction to an area of impaired lymphatic drainage. If swelling occurs in an area of impaired lymphatic drainage after a procedure, the provider should give care instructions to the patient.

## 2020-12-14 NOTE — Invasive Procedure Plan of Care (Addendum)
[place]  OR SURGICAL PROCEDURE                            Patient Name: Carly Higgins  [facility] 0[address] MR                                                              DOB: 11/30/1960         Please read this form or have someone read it to you.   It's important to understand all parts of this form. If something isn't clear, ask [place] to explain.   When you sign it, that means you understand the form and give [place] permission to do this surgery or procedure.     I agree for Minus Liberty, MD , and Surgical residents and advanced practice providers of the surgical oncology team along with any assistants* they may choose, to treat the following condition(s): Left breast cancer   By doing this surgery or procedure on me: Excision of left breast tissue and node   This is also known as: Left Needle localization partial mastectomy with axillary sentinel node biopsy, possible left axillary lymph node dissection    Laterality: Left     *if you'd like a list of the assistants, please ask. We can give that to you.    1. The care provider has explained my condition to me. They have told me how the procedure can help me. They have told me about other ways of treating my condition. I understand the care provider cannot guarantee the result of the procedure. If I don't have this procedure, my other choices are: No surgery    2. The care provider has told me the risks (problems that can happen) of the procedure. I understand there may be unwanted results. The risks that are related to this procedure include: Bleeding, Infection, fluid collection (seroma or hematoma)  Delayed wound healing, arm swelling (lymphedema) or numbness, potential need for further Surgery    3. I understand that during the procedure, my care provider may find a condition that we didn't know about before the treatment started. Therefore, I agree that my care provider can perform any other treatment which they think is necessary and  available.    4. I give permission to the hospital and/or its departments to examine and keep tissue, blood, body parts, fluids or materials removed from my body during the procedure(s) to aid in diagnosis and treatment, after which they may be used for scientific research or teaching by appropriate persons. If these materials are used for science or teaching, my identity will be protected. I will no longer own or have any rights to these materials regardless of how they may be used.    5. My care provider might want a representative from a [place] to be there during my procedure. I understand that person works for:          The ways they might help my care provider during my procedure include:            6. Here are my decisions about receiving blood, blood products, or tissues. I understand my decisions cover the time before, during and after my procedure, my treatment,  and my time in the hospital. After my procedure, if my condition changes a lot, my care provider will talk with me again about receiving blood or blood products. At that time, my care provider might need me to review and sign another consent form, about getting or refusing blood.    I understand that the blood is from the community blood supply. Volunteers donated the blood, the volunteers were screened for health problems. The blood was examined with very sensitive and accurate tests to look for hepatitis, HIV/AIDS, and other diseases. Before I receive blood, it is tested again to make sure it is the correct type.    My chances of getting a sickness from blood products are small. But no transfusion is 100% safe. I understand that my care provider feels the good I will receive from the blood is greater than the chances of something going wrong. My care provider has answered my questions about blood products.      My decision  about blood or  blood products   Not applicable.        My decision   about tissue  Implants     Not  applicable.          I understand this  form.    My care provider  or his/her  assistants have  explained:   What I am having done and why I need it.  What other choices I can make instead of having this done.  The benefits and possible risks (problems) to me of having this done.  The benefits and possible risks (problems) to me of receiving transplants, blood, or blood products.  There is no guarantee of the results.  The care provider may not stay with me the entire time that I am in the operating or procedure room.  My provider has explained how this may affect my procedure. My provider has answered my  questions about this.         I give my  permission for  this surgery or  procedure.            _______________________________________________                                     My signature  (or parent or other person authorized to sign for you, if you are unable to sign for  yourself or if you are under 71 years old)        ______           Date        _____        Time   Electronic Signatures will display at the bottom of the consent form.    Care provider's statement: I have discussed the planned procedure, including the possibility for transfusion of blood  products or receipt of tissue as necessary; expected benefits; the possible complications and risks; and possible alternatives  and their benefits and risks with the patients or the patient's surrogate. In my opinion, the patient or the patient's surrogate  understands the proposed procedure, its risks, benefits and alternatives.              Electronically signed by: Letitia Libra, PA  12/14/2020         Date        3:29 PM        Time

## 2020-12-14 NOTE — Progress Notes (Addendum)
Comprehensive Breast [facility] at [facility]  Patient Name: Carly Higgins  MRN: [id number]  DOB: [birth year]  Date of Service: 12/14/2020  Provider Name: Cherlynn Perches, PA-C   ?   DIAGNOSIS: Invasive ductal carcinoma, triple negative, left breast  ?  INTERVAL SINCE DIAGNOSIS:    HPI:    This is a 60 year old with a "triple negative" left breast cancer that appears to be about 2.5 cm who is here today to follow up. She is likely a candidate for breast conservation and has imaging pending this week to evaluate further, and sentinel node biopsy is indicated for staging. She has no current breast pain and has been undergoing chemotherapy without issue. She is scheduled to complete chemotherapy this month ( in total planned for 4 cycles of paclitaxel and carboplatin, patient will receive 4 cycles of doxorubicin 60 mg/m and cyclophosphamide 600 mg/m on day 1 of each 21-day cycle.) She is here today to discuss surgical plans following chemo completion.       REVIEW OF SYSTEMS: Comprehensive ROS was taken on the patient intake form and reviewed with the patient.  A comprehensive review of systems was negative.    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 There is no history of Ovarian cancer.    PHYSICAL EXAMINATION:  Vitals: BP 114/57     Pulse 110    Temp 35.5 C (95.9 F) (Temporal)    Wt 114.8 kg (253 lb 1.4 oz)    SpO2 100%    BMI 40.77 kg/m   Gen:  Well nourished, well developed female.  No acute distress.  Lungs: clear to auscultation bilaterally  Heart: regular rate and rhythm, S1, S2 normal, no murmur, click, rub or gallop  Lymphatics:  No cervical, supraclavicular or axillary adenopathy bilaterally.   Breast Exam: The patient is examined in the sitting and supine positions.  Symmetric in size and contour.  No erythema or peau d'orange.  Bilateral everted nipples with no spontaneous discharge and no perimembranous changes.  No discrete masses noted bilaterally.   Abdomen: Soft, nontender, nondistended, no organomegaly, no palpable masses.  Extremities: extremities normal, atraumatic, no cyanosis or edema. There is not evidence of lymphedema. ROM is within normal limits  Skin: Skin color, texture, turgor normal. No rashes or lesions  Neurologic: Grossly normal    IMAGING:     U/S Left breast 07/01/20  Reading Provider: Regis Bill, MD  Paulita Cradle, PA  Signed By:  Regis Bill, MD on 07/05/2020  2:55 PM  Prominent lymph node in the left breast is suspicious. Patient has known left breast cancer. Ultrasound guided core biopsy  is recommended.      The patient was made aware of the above findings and recommendations immediately after the exam.      Patient is scheduled to return for the procedure on 07/05/2020. Rosita Kea, RN spoke with Morey Hummingbird at [place] on 07/04/2020 regarding the recommendation for biopsy.      BI-RADS Category 4: Suspicious Abnormality     MRI pending this week      ?  ASSESSMENT AND PLAN:  This is a 60 year old with a "triple negative" left breast cancer that appeared to be about 2.5 cm who is here today to follow up.     She is a candidate for breast conservation and sentinel node biopsy is indicated for staging at this point, and will ensure imaging this week has not changed significantly from the last. She  has no current breast pain and has been undergoing chemotherapy without issue. She is scheduled to complete chemotherapy this month ( in total planned for 4 cycles of paclitaxel and carboplatin, patient will receive 4 cycles of doxorubicin 60 mg/m and cyclophosphamide 600 mg/m on day 1 of each 21-day cycle.) Will plan for left breast lumpectomy with SLNBx on Jan 17th. Patient has been consented in clinic and the risks and benefits have been discussed including bleeding, infection, damage to surrounding structures, need for further procedures, lymphedema and remaining risks listed on consents.    Total time spent on 12/14/20 for this visit in face-to-face and non face-to-face time reviewing, obtaining, and documenting clinical information, coordinating with the care team and other specialists, and counseling the patient: 30 minutes.     Audree Camel, MD    Attending attestation: I interviewed, examined and counseled the patient in the presence of the resident. I edited the above note and agree with what is documented.    When she presented, she had about a 2.5 cm tumor. She is scheduled for follow up imaging in the coming week. Her current breast exam is normal. She is a candidate for breast conservation and she prefers that approach. Sentinel node biopsy is planned for staging. She was clinically and radiographically node negative at presentation so I don't expect there to be disease. We discussed management of the axilla in the unlikely event that gross disease is identified. She would consent to axillary dissection if that were the case. Otherwise, we will plan to wait for final pathology to dictate that decision. Surgery is scheduled for 1/17.

## 2020-12-15 ENCOUNTER — Other Ambulatory Visit: Payer: Self-pay | Admitting: Surgical Oncology

## 2020-12-15 ENCOUNTER — Other Ambulatory Visit: Payer: Self-pay

## 2020-12-15 ENCOUNTER — Ambulatory Visit
Admission: RE | Admit: 2020-12-15 | Discharge: 2020-12-15 | Disposition: A | Discharge status: Home / Self Care | Payer: Medicare Other | Source: Ambulatory Visit | Attending: Family | Admitting: Family

## 2020-12-15 DIAGNOSIS — Z171 Estrogen receptor negative status [ER-]: Secondary | ICD-10-CM

## 2020-12-15 DIAGNOSIS — C50412 Malignant neoplasm of upper-outer quadrant of left female breast: Secondary | ICD-10-CM

## 2020-12-15 MED ORDER — GADOTERIDOL 279.3 MG/ML (PROHANCE) IV SOLN *I*
20.0000 mL | Freq: Once | INTRAVENOUS | Status: AC
Start: 2020-12-15 — End: 2020-12-15
  Administered 2020-12-15: 20 mL via INTRAVENOUS

## 2020-12-21 ENCOUNTER — Ambulatory Visit
Admission: RE | Admit: 2020-12-21 | Discharge: 2020-12-21 | Disposition: A | Discharge status: Home / Self Care | Payer: Medicare Other | Source: Ambulatory Visit | Attending: Family | Admitting: Family

## 2020-12-21 ENCOUNTER — Other Ambulatory Visit: Payer: Self-pay | Admitting: Family

## 2020-12-21 ENCOUNTER — Ambulatory Visit
Admission: RE | Admit: 2020-12-21 | Discharge: 2020-12-21 | Disposition: A | Discharge status: Home / Self Care | Payer: Medicare Other | Source: Ambulatory Visit

## 2020-12-21 ENCOUNTER — Other Ambulatory Visit: Payer: Self-pay

## 2020-12-21 DIAGNOSIS — Z171 Estrogen receptor negative status [ER-]: Secondary | ICD-10-CM

## 2020-12-21 DIAGNOSIS — C50412 Malignant neoplasm of upper-outer quadrant of left female breast: Secondary | ICD-10-CM | POA: Insufficient documentation

## 2020-12-27 ENCOUNTER — Telehealth: Payer: Self-pay | Admitting: Oncology

## 2020-12-27 ENCOUNTER — Ambulatory Visit: Payer: Medicare Other | Admitting: Oncology

## 2020-12-28 ENCOUNTER — Telehealth: Payer: Self-pay | Admitting: Oncology

## 2020-12-28 ENCOUNTER — Ambulatory Visit: Payer: Medicare Other | Admitting: Oncology

## 2020-12-28 NOTE — Telephone Encounter (Signed)
Appointment cancelled, will call patient tomorrow per patient reuqest

## 2020-12-29 ENCOUNTER — Telehealth: Payer: Self-pay | Admitting: Hematology and Oncology

## 2020-12-29 NOTE — Telephone Encounter (Signed)
Calling to reschedule both appts tomorrow until next week.

## 2020-12-29 NOTE — Telephone Encounter (Signed)
LVM for rescheduled NPV x1

## 2020-12-30 ENCOUNTER — Ambulatory Visit: Payer: Medicare Other

## 2020-12-30 ENCOUNTER — Ambulatory Visit: Payer: Medicare Other | Admitting: Hematology and Oncology

## 2021-01-03 ENCOUNTER — Other Ambulatory Visit: Payer: Self-pay | Admitting: Oncology

## 2021-01-03 NOTE — Progress Notes (Signed)
[place]     FOLLOW-UP VISIT    PATIENT NAME:  Carly Higgins, Carly Higgins    DOB:  September 07, 1960   MRN:  [id number]  01/04/2021     DIAGNOSIS:  Clinical stage IIB left breast cancer    REASON FOR VISIT: Carly Higgins is seen today for follow up to assess recovery from chemotherapy and readiness for surgery.    She received final; cycle 4 of doxorubicin and cyclophosphamide on December 08, 2020, as neoadjuvant chemotherapy for clinical stage IIB (cT2, cN0, cM0) grade 3, ER negative, PR negative, HER2 negative infiltrating ductal carcinoma of the left breast.  Just prior to that she completed 4 cycles of weekly paclitaxel and carboplatin.      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    In February 2022 she began to experience discomfort in the left breast.  She first attributed this to having been struck with a part of her dog when she picked it up about 1 week earlier.  She continued to experience discomfort intermittently. She scheduled a screening mammogram, having not had an exam since July 2018.         MPN (myeloproliferative neoplasm)   01/19/2014 Initial Diagnosis    MPN (myeloproliferative neoplasm)     07/15/2020 -  Chemotherapy     Began cycle 1 of weekly paclitaxel and carboplatin on July 15, 2020.  Immunotherapy with pembrolizumab was not recommended due to her previous allogenic bone marrow transplant.   Final cycle (4) of paclitaxel and carboplatin was administered on September 30, 2020.   Began dose dense cyclophosphamide and doxorubicin on October 06, 2020    OP Breast CARBOplatin Weekly Paclitaxel then [facility]   Plan Provider: Beverely Pace, MD  Treatment goal: Neoadjuvant  Line of treatment: [No plan line of treatment]     IDC left 2 o'clock 2.5 cm, ER/PR- her 2-   05/18/2020 Significant Radiology Findings    Screening [place]: scattered tissue,  architectural distortion left 2 o'clock. BIRAD 0       06/07/2020 Significant Radiology Findings    Ultrasound [place]: irregular hypoechoic mass left 2 o'clock, 19x19x12 mm, 25 mm radial. middle third upper outer quadrant of the left breast. BIRAD 4  Ultrasound guided core IDC     06/09/2020 Procedure    ultrasound-guided core biopsy of the left breast 2:00 lesion:   Biopsy revealed invasive ductal carcinoma.  Provisional combined Nottingham grade was 3 of 3 (tubule formation score 3, nuclear grade 3, mitotic rate score 2).  Tumor cells were negative for estrogen receptors and progesterone receptors.  Tumor cells were negative for HER2 by immunohistochemical staining with a staining score of 1+.  Ki-67 staining was seen in approximately 70% of tumor cells.        06/13/2020 Consultation    She was seen in the Hereditary Oncology clinic :  Because of her prior allogeneic bone marrow transplant, she was not eligible for standard germline testing with blood or saliva specimen.       06/15/2020 Initial Diagnosis    IDC left 2 o'clock 2.5 cm, ER/PR- her 2-     06/15/2020 Cancer Staged    Staging form: Breast, AJCC 8th Edition  - Clinical stage from 06/15/2020: Stage IIB (cT2, cN0, cM0, G3, ER-, PR-, HER2-)     06/15/2020 Consultation    Surgical consultation:  Dr. Freda Munro      06/20/2020 Significant Radiology Findings    Whole-body bone scan : No abnormal focus of radiotracer uptake to suggest osseous metastatic disease.     06/22/2020 Significant Radiology Findings    CT of the chest: mill-defined enhancing lesion in the lateral left breast measuring 1.7 x 1.2 x 1.1 cm.  There were asymmetrically prominent level 1 left axillary lymph nodes, enhancement of uncertain significance, possibly representing metastatic disease. No enlarged mediastinal, hilar, intramammary,  supraclavicular lymph nodes or suspicious pulmonary nodules were identified.       06/22/2020 Significant Radiology Findings    CT of the abdomen pelvis : no  evidence of metastatic disease . small fat filled umbilical hernia noted.     06/28/2020 Significant Radiology Findings    MRI of the breast: irregular mass with spiculated margins measuring 10 x 25 x 19 mm in the left breast upper outer quadrant at the 2 o'clock position located 3 cm from the nipple.      07/15/2020 -  Chemotherapy     Began cycle 1 of weekly paclitaxel and carboplatin on July 15, 2020.  Immunotherapy with pembrolizumab was not recommended due to her previous allogenic bone marrow transplant.   Final cycle (4) of paclitaxel and carboplatin was administered on September 30, 2020.   Began dose dense cyclophosphamide and doxorubicin on October 06, 2020    OP Breast CARBOplatin Weekly Paclitaxel then [facility]   Plan Provider: Beverely Pace, MD  Treatment goal: Neoadjuvant  Line of treatment: [No plan line of treatment]     08/18/2020 Procedure    PowerPort in the right internal jugular vein.         INTERVAL HISTORY: Carly Higgins presents alone today.  She was last seen in our office on December 08, 2020 (she received cycle 4 dose dense adriamycin and cyclophosphamide the same day).  Today she reports:    She tolerated cycle 4 much like the previous 3 cycles.  She experienced decreased appetite and increased fatigue for several days following treatment.  She has lost a little more weight.  She feels some of this is also related to sensation of fullness and believes this is due to her diabetes medication MOUNJARO.  She is happy to report that her HgbA1c has dropped to 5.1    She reports ongoing daily morning nausea for which ondansetron has been helpful.  She has experienced this since her BMT.  She has also noticed increased acid reflux recently, which may be contributing.        She continues with nausea daily and throughout the day typically improves with rest,  compazine (each AM) on days 1 and 2 post chemo and ondansetron 46m daily thereafter.   she has not taken this today and has been having  She  has not vomited, diarrhea or constipation.    Now that she is almost 4 weeks out from her last treatment she is noticing improved appetite, less fatigue, and is "feeling more alert".  She no longer experiences any pain in left breast; she cannot feel any masses or lumps in left breast.    She had bilateral breast MRI (12/15/20) and left breast mammogram and [place](12/21/20) for surgical planning.  Her left breast surgery is scheduled on January 24, 2021.      She also met with radiation oncology (12/11/20).  Radiation will be planned once recovered from surgery (generally 4-6 weeks later).  She is hoping to spend some  time with family in [place] while recovering from surgery and before radiation.    She was scheduled to have tobacco cessation consult on December 21.  She had to postpone this when she was not feeling well.  Her reschedule date is February 01, 2021; this is after her surgery.  She has been smoking again; has cut back quite a bit.    Otherwise, she denies bowel irregularities or abdominal pain.  She denies fever,chills, exertional chest pain, dizziness.      She continues to have chronic left hip pain.  This has been present > 1 year.  She noticed some improvement for a few days after steroids given with chemotherapy.  Pain is worst when ambulating (sometimes 8/10). Last cortisone injection was summer 2022 . Planning for hip replacement after completion of chemotherapy.     She has some baseline numbness in her first toe bilaterally.  Neuropathy symptoms worsened in her feet with paclitaxel.  She takes gabapentin at bedtime with some relief.  Today she reports that she has has noticed some improvement in numbness, tingling, burning sensation in the balls and toes of her feet bilaterally. Tried acupuncture X 3 does not feel there is a benefit. Had a foot massage once which was calming but did not help with neuropathy.  Gait remains unsteady but more due to left hip pain.       Toenails remain sensitive.   Fingernails have lifted some at the tips but are intact.  She has noticed the very beginning of hair regrowth on scalp.    She will be having tooth extracted tomorrow; this was put off until she finished chemotherapy.    ROS: As outlined above: comprehensive review of systems is otherwise negative.     Immunization History  She reports that she received annual flu vaccine on October 25, 2020.     Administered Date(s) Administered    COVID-19 MRNA Bivalent Vaccine Booster Pfizer-Biontech 30 mcg/0.59m IM SUSP 10/25/2020    Covid-19 mRNA vaccine (PFIZER) IM 30 mcg/0.362m05/07/2019, 06/05/2019, 10/08/2019    DTaP(Infanrix) 08/01/2016, 09/25/2016, 11/22/2016    Hepatitis B Adult 08/01/2016, 09/25/2016, 02/21/2017, 08/15/2017    HiB(Acthib/Hiberix) 08/01/2016, 09/25/2016, 11/22/2016    IPV 08/01/2016, 09/25/2016, 11/22/2016    Meningococcal Conjugate MCV4O (Menveo) 08/01/2016    Pneumococcal 13-valent Conjugate (Prevnar 13) 08/01/2016, 09/25/2016, 11/22/2016    Pneumococcal Polysaccharide (Pneumovax) 02/21/2017, 08/15/2017    Zoster(Shingrix) 02/21/2017, 08/15/2017       PAST MEDICAL/SURGICAL HISTORY:   1. 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  Prior to bone marrow transplant she received 5 cycles of decitabine.  She received fludarabine, busulfan and and hydroxyurea for the transplant and received methotrexate for graft-versus-host prophylaxis.  2. Hypothyroid.  3. Anxiety.  4. Osteoarthritis of the hips, left greater than right.  5. Mild diverticulosis of the sigmoid colon.  6. History of atrial fibrillation and congestive heart failure in 2017 related to the bone marrow transplant.  7. Right cataract surgery.  8. Right rotator cuff surgery.    MEDICATIONS:     Current Outpatient Medications   Medication Sig    atorvastatin (LIPITOR) 40 mg tablet SMARTSIG:1 Tablet(s) By Mouth Every  Evening    ondansetron (ZOFRAN) 4 mg tablet Take 1 tablet (4 mg total) by mouth 3 times daily as needed (nausea)    MOUNJARO 5 MG/0.5ML pen SMARTSIG:5 Milligram(s) SUB-Q Once a Week    GABAPENTIN  300 MG capsule Take 300 mg by mouth daily    acyclovir (ZOVIRAX) 400 mg tablet Take 1 tablet (400 mg total) by mouth 2 times daily    metoprolol tartrate (LOPRESSOR) 50 mg tablet Take 1 tablet (50 mg total) by mouth 2 times daily    cholecalciferol (VITAMIN D) 50 MCG (2000 UT) tablet Take 1,000 Units by mouth daily    levothyroxine (SYNTHROID, LEVOTHROID) 50 MCG tablet Take 50 mcg by mouth daily (before breakfast)    escitalopram (LEXAPRO) 20 MG tablet Take 20 mg by mouth daily    aspirin 81 MG EC tablet Take 81 mg by mouth daily    cephalexin (KEFLEX) 500 mg capsule SMARTSIG:Capsule(s) By Mouth    famotidine (PEPCID) 20 mg tablet Take 1 tablet (20 mg total) by mouth 2 times daily as needed for Heartburn  for Gastroesophageal Reflux Disease    prochlorperazine (COMPAZINE) 10 mg tablet Take 1 tablet (10 mg total) by mouth every 6 hours as needed (nausea) (Patient not taking: No sig reported)     OBJECTIVE FINDINGS:  Vitals:    01/04/21 0850   BP: 120/81   Pulse: 90   Temp: 35.9 C (96.6 F)   TempSrc: Temporal   SpO2: 97%   Weight: 113.6 kg (250 lb 7.1 oz); down 2.0 kg since 11/24/20    Down ~ 6 kg since beginning neoadjuvant chemotherapy 07/15/20        Pain    01/04/21 0850   PainSc:   0 - No pain     PHYSICAL EXAMINATION:     General: Reveals a well-appearing woman; pleasant and engaged. ECOG performance status is 1.    HEENT : grossly unremarkable.  Sclerae are anicteric. Conjunctivae are pink.  Oral mucosa is without stomatitis or thrush.   Lymph nodes: There is no palpable cervical, supraclavicular, axillary or inguinal lymph node enlargement.   Chest: clear to auscultation and percussion.  PowerPort is present in the upper chest on the right no erythema, or edema  Cardiovacular: RRR. No murmur    Breasts:  Exam deferred today as she has just had MMG/US and MRI for surgical planning.  Abdomen: is soft,non tender  Bowel sounds are +  All quadrants.no hepatosplenomegaly, mass or ascites detected .   Extremities: are without edema.   Neurologic exam: is grossly nonfocal.   Skin: is dry  without rash, petechiae or ecchymoses. patches of vitiligo in the upper abdomen. Mild lifting on distal fingernails; all intact.    LABORATORY DATA:     Lab results: 01/04/21  0900  27 days from cycle 4 [facility] 12/08/20  1242 11/24/20  0941   WBC 5.6 5.8 0.6*   Hemoglobin 9.4* 8.7* 8.7*   Hematocrit 31* 28* 28*   RBC 3.0* 2.7* 2.7*   Platelets 271 320 145*   Neut # K/uL 3.8 3.7 0.1*   Lymph # K/uL 0.9* 0.9* 0.5*   Mono # K/uL 0.7 1.1* 0.0*   Eos # K/uL 0.1 0.1 0.0   Baso # K/uL 0.1 0.1 0.0   Seg Neut % 68.2 63.0 5.1   Lymphocyte % 15.4 16.0 82.0   Monocyte % 13.0 18.1 1.3   Eosinophil % 2.1 1.5 6.4   Basophil % 1.3 1.4 2.6   Bands %  --   --  3   Myelocyte %  --   --   --            Lab results: 01/04/21  0900 12/08/20  1242 11/24/20  0941 11/17/20  1158 10/26/20  0740 10/05/20  0914   WBC 5.6 5.8 0.6* 4.9 6.2 3.1*   Neut # K/uL 3.8 3.7 0.1* 2.8 3.5 1.4*   Lymph # K/uL 0.9* 0.9* 0.5* 1.1* 1.4 1.5   Hematocrit 31* 28* 28* 28* 28* 28*   Hemoglobin 9.4* 8.7* 8.7* 8.3* 8.7* 9.4*   MCV 104* 106* 104* 109* 105* 100*   Platelets 271 320 145* 323 303 208         Lab results: 01/04/21  0900 12/08/20  1242 11/24/20  0941   Sodium 140 138 136   Potassium 4.3 4.4 4.8   Chloride 102 102 99   CO2 23 24 26    UN 14 14 13    Creatinine 0.62 0.60 0.61   Glucose 166* 121* 138*   Calcium 9.4 9.7 9.7   Total Protein 6.2* 6.3 6.2*   Albumin 4.2 4.1 4.1   ALT 21 35 26   AST 19 35 18   Alk Phos 93 82 95   Bilirubin,Total 0.5 0.4 0.6     IMAGING DATA:   MRI breasts on December 15, 2020:  Previously seen irregular spiculated enhancing mass in the left breast does not demonstrate significant residual enhancement. A biopsy clip is noted along the medial margin of the  previously seen mass.  There is no axillary or internal mammary lymphadenopathy. In the right breast, there is no evidence of any mass or suspicious enhancements.  Impression:  Area in the left breast is a known malignancy. No significant residual enhancement in the area of previously seen mass, consistent with excellent/complete radiologic response.     Left breast mammogram and ultrasound on December 21, 2020:  Impression:  Mass in the left breast upper outer quadrant at 2 o'clock located 8 centimeters from the nipple is a known malignancy. Surgical consultation is recommended. Patient completed chemotherapy treatment on 12/08/2020. Patient is scheduled for left lumpectomy on 01/24/2021.     Finding 2:  Normal lymph node and biopsy clip in the left axilla is benign.     Finding 3:  Stable calcifications in the left breast are benign.         ASSESSMENT:     Carly Higgins has now completed all cycles of neoadjuvant chemotherapy; she is now nearly 4 weeks post final dose.  Overall, she appears to be recovering quite well.  WBC and platelet count have recovered to normal range; anemia is slowly improving.    She has had repeat (surgical planning) breast imaging.  MRI found no significant residual enhancement in the area of previously seen mass, consistent with excellent/complete radiologic response. No abnormal lymph nodes were identified on mammogram/US. Her surgery is scheduled on January 24, 2021.    She was scheduled to have tobacco cessation consult on December 21.  She had to postpone this when she was not feeling well.  Her reschedule date is February 01, 2021; this is after her surgery.  I will reach out to Carly Needles NP (tobacco cessation coach) to see if she might be able to consult with Carly Higgins before surgery.  Carly Higgins reports that she has cut back on cigarettes quite a bit and feels she will be able to refrain entirely after surgery.    PLAN:     Clinical stage IIB triple negative left breast cancer   Neoadjuvant  chemotherapy; final dose administered December 08, 2020   Continue to hold on immunotherapy with pembrolizumab in light of her previous  allogenic  bone marrow transplant.    Left breast MRI and Left diagnostic mammogram and ultrasound for surgical planning as  included above    Left partial mastectomy (Dr. Freda Munro) on January 24, 2021 as planned; then follow  up on February 06, 2021   Radiation therapy to begin 4-6 weeks after surgery   Follow up here (medical oncology) in 4 weeks; we will arrange to see her same day she  is here for surgical follow up      2. Nausea/dyspepsia   She may continue to use ondansetron 4 mg by mouth 3 times daily as needed (nausea)    I have also suggested that she try famotidine 20 mg twice daily as needed for a couple  weeks      4. Chemotherapy-induced fatigue   Improving   Maintain physical activity   Maintain hydration    Recommend well balanced diet consisting of healthy foods to  help increase your energy levels include fruits, vegetables, whole-grain breads, low-fat dairy products, beans, lean meats, and fish.     5. Peripheral neuropathy   Did not receive benefit from acupuncture X 3   Will continue Gabepentin as prescribed by PCP   Some improvement recently; still bothersome at night when not distracted    6. Antineoplastic chemotherapy induced anemia   Improving   Monitor for increasing shortness of breath, fatigue, weakness or dizziness   Recheck CBC in 4 weeks    7. Left hip pain   Continue to follow with ortho; planning for hip replacement after completion of chemotherapy   limit use of NSAID's ,not to exceed maximum daily dose from any source due to likelihood of decreased platelet count  between treatments and risk for GI bleed.     Discussed option of  taking tylenol extra strength 1000 mg up to every 6-8 hours (not exceeding recommended maximum daily dose) between NSAID medication.    May continue Arnica gel 1 dime size drop applied to left hip  at area of focal tenderness upon rising in am. Do not take NSAIDS within 2 hours of application.    8. Tobacco dependence   Ready to quit smoking   Referred to tobacco cessation program    See discussion above in assessemnt    9. Health maintenance    COVID-19     Patient has received the [place] COVID-19 vaccine, receiving the first dose on May 15, 2019, second dose on Jun 05, 2019  booster dose on October 08, 2019 and the COVID-19 MRNA Bivalent Vaccine Booster Pfizer-Biontech on  October 25, 2020.     Patient advised to continue to take precautions to reduce risk of contracting COVID-19, including social distancing, frequent handwashing and using a mask when in public.    Influenza    She reported annual flu vaccine on October 25, 2020.      Follow-up     Return for follow-up in 4 weeks; we can see on 02/06/2021 when she will be here for surgical follow up   Left lumpectomy and SLNB on January 24, 2021 as scheduled   Follow up with Dr. Freda Munro (Surgical oncology) on February 06, 2021   Radiation treatment;  anticipate beginning radiation 4-6 weeks after surgery    Patient was reminded to call the clinic at any time with any questions or concerns that may arise in the interval period    Patient complied with masking policy throughout the duration of the appointment.   Writer maintained use  of mask  throughout  the duration of the appointment.   Writer was within 6 feet of patient for < 5 minutes due to the nature of the appointment.    I personally spent 45 minutes minutes on the calender day of the encounter, including pre and post visit work.    Ival Bible ANP, BC      BO:[id number] Fredda Hammed, MD  Minus Liberty, MD  Assunta Curtis, MD

## 2021-01-04 ENCOUNTER — Ambulatory Visit: Payer: Medicare Other

## 2021-01-04 ENCOUNTER — Ambulatory Visit: Payer: Medicare Other | Attending: Oncology | Admitting: Oncology

## 2021-01-04 ENCOUNTER — Other Ambulatory Visit: Payer: Self-pay

## 2021-01-04 VITALS — BP 120/81 | HR 90 | Temp 96.6°F | Wt 250.4 lb

## 2021-01-04 DIAGNOSIS — Z452 Encounter for adjustment and management of vascular access device: Secondary | ICD-10-CM | POA: Insufficient documentation

## 2021-01-04 DIAGNOSIS — D471 Chronic myeloproliferative disease: Secondary | ICD-10-CM

## 2021-01-04 DIAGNOSIS — C50412 Malignant neoplasm of upper-outer quadrant of left female breast: Secondary | ICD-10-CM | POA: Insufficient documentation

## 2021-01-04 DIAGNOSIS — M25552 Pain in left hip: Secondary | ICD-10-CM

## 2021-01-04 DIAGNOSIS — R110 Nausea: Secondary | ICD-10-CM

## 2021-01-04 DIAGNOSIS — Z9481 Bone marrow transplant status: Secondary | ICD-10-CM

## 2021-01-04 DIAGNOSIS — R1013 Epigastric pain: Secondary | ICD-10-CM

## 2021-01-04 DIAGNOSIS — R5383 Other fatigue: Secondary | ICD-10-CM

## 2021-01-04 DIAGNOSIS — D849 Immunodeficiency, unspecified: Secondary | ICD-10-CM

## 2021-01-04 DIAGNOSIS — T451X5A Adverse effect of antineoplastic and immunosuppressive drugs, initial encounter: Secondary | ICD-10-CM

## 2021-01-04 DIAGNOSIS — L608 Other nail disorders: Secondary | ICD-10-CM

## 2021-01-04 DIAGNOSIS — F17210 Nicotine dependence, cigarettes, uncomplicated: Secondary | ICD-10-CM | POA: Insufficient documentation

## 2021-01-04 DIAGNOSIS — G629 Polyneuropathy, unspecified: Secondary | ICD-10-CM

## 2021-01-04 DIAGNOSIS — F17200 Nicotine dependence, unspecified, uncomplicated: Secondary | ICD-10-CM

## 2021-01-04 DIAGNOSIS — Z171 Estrogen receptor negative status [ER-]: Secondary | ICD-10-CM | POA: Insufficient documentation

## 2021-01-04 LAB — CBC AND DIFFERENTIAL
Baso # K/uL: 0.1 10*3/uL (ref 0.0–0.1)
Basophil %: 1.3 %
Eos # K/uL: 0.1 10*3/uL (ref 0.0–0.4)
Eosinophil %: 2.1 %
Hematocrit: 31 % — ABNORMAL LOW (ref 34–45)
Hemoglobin: 9.4 g/dL — ABNORMAL LOW (ref 11.2–15.7)
Lymph # K/uL: 0.9 10*3/uL — ABNORMAL LOW (ref 1.2–3.7)
Lymphocyte %: 15.4 %
MCH: 32 pg (ref 26–32)
MCHC: 31 g/dL — ABNORMAL LOW (ref 32–36)
MCV: 104 fL — ABNORMAL HIGH (ref 79–95)
Mono # K/uL: 0.7 10*3/uL (ref 0.2–0.9)
Monocyte %: 13 %
Neut # K/uL: 3.8 10*3/uL (ref 1.6–6.1)
Platelets: 271 10*3/uL (ref 160–370)
RBC: 3 MIL/uL — ABNORMAL LOW (ref 3.9–5.2)
RDW: 19.1 % — ABNORMAL HIGH (ref 11.7–14.4)
Seg Neut %: 68.2 %
WBC: 5.6 10*3/uL (ref 4.0–10.0)

## 2021-01-04 LAB — COMPREHENSIVE METABOLIC PANEL
ALT: 21 U/L (ref 0–35)
AST: 19 U/L (ref 0–35)
Albumin: 4.2 g/dL (ref 3.5–5.2)
Alk Phos: 93 U/L (ref 35–105)
Anion Gap: 15 (ref 7–16)
Bilirubin,Total: 0.5 mg/dL (ref 0.0–1.2)
CO2: 23 mmol/L (ref 20–28)
Calcium: 9.4 mg/dL (ref 8.6–10.2)
Chloride: 102 mmol/L (ref 96–108)
Creatinine: 0.62 mg/dL (ref 0.51–0.95)
Glucose: 166 mg/dL — ABNORMAL HIGH (ref 60–99)
Lab: 14 mg/dL (ref 6–20)
Potassium: 4.3 mmol/L (ref 3.3–5.1)
Sodium: 140 mmol/L (ref 133–145)
Total Protein: 6.2 g/dL — ABNORMAL LOW (ref 6.3–7.7)
eGFR BY CREAT: 102 *

## 2021-01-04 LAB — NEUTROPHIL #-INSTRUMENT: Neutrophil #-Instrument: 3.8 10*3/uL

## 2021-01-04 LAB — LACTATE DEHYDROGENASE: LD: 172 U/L (ref 118–225)

## 2021-01-04 MED ORDER — HEPARIN LOCK FLUSH 10 UNIT/ML IJ SOLN WRAPPED *I*
50.0000 [IU] | INTRAVENOUS | Status: DC | PRN
Start: 2021-01-04 — End: 2021-01-04
  Administered 2021-01-04: 50 [IU]

## 2021-01-04 MED ORDER — ONDANSETRON HCL 4 MG PO TABS *I*
4.0000 mg | ORAL_TABLET | Freq: Three times a day (TID) | ORAL | 3 refills | Status: DC | PRN
Start: 2021-01-04 — End: 2021-07-17

## 2021-01-04 MED ORDER — FAMOTIDINE 20 MG PO TABS *I*
20.0000 mg | ORAL_TABLET | Freq: Two times a day (BID) | ORAL | 1 refills | Status: DC | PRN
Start: 2021-01-04 — End: 2021-02-06

## 2021-01-04 NOTE — Progress Notes (Signed)
Patient presents for port draw. Port accessed without difficulty. Flushes easily and blood return noted. Labs drawn per orders. Port deaccessed without issues and flushed with Heparin per policy. Gauze dressing placed over site. Discharged to clinic in stable condition.     Patient complied with masking policy throughout duration of appointment. Writer maintained use of mask throughout duration of appointment. Writer was within 6 feet of patient for > 15 minutes due to the nature of the treatment.

## 2021-01-04 NOTE — Patient Instructions (Signed)
Clinical stage IIB triple negative left breast cancer  Neoadjuvant chemotherapy; final dose administered December 08, 2020  Continue to hold on immunotherapy with pembrolizumab in light of her previous allogenic  bone marrow transplant.   Left breast MRI with and without contrast on December 15, 2020 and Left diagnostic mammogram and ultrasound (December 21, 2020) for surgical planning   Left partial mastectomy (Dr. Freda Munro) on January 24, 2021 as planned  Radiation therapy to begin 4-6 weeks after surgery  Follow up here (medical oncology) in 4 weeks            December 2022      Sunday Monday Tuesday Wednesday Thursday Friday Saturday                       1    INJECTION  12:30 PM   (15 min.)   Nash, Melissa, RN   [facility]    FOLLOW UP VISIT   1:00 PM   (60 min.)   Natale, Julie, NP   [facility]    Outpatient Visit   1:45 PM   [facility]    ACUPUNCTURE   2:00 PM   (30 min.)   Fang, Jin, LAC   [facility] Integrative [facility]    CHEMOTHERAPY   2:00 PM   (120 min.)   Davis, Alexander, RN   [facility] 2     3       4     5     6    NEW PATIENT VISIT  12:30 PM   (90 min.)   Gergelis, Kimberly Rose, MD   [facility] Radiation Oncology 7    FOLLOW UP VISIT   2:30 PM   (30 min.)   Olzinski-Kunze, Ann, MD   [facility] 8    RIS BI MR 60   3:30 PM   (60 min.)   RIS, RR MR1 (RRMR1)    [facility] [address] 9     10       11     12     13     14    RIS BI MG DIAGNOSTIC 30  10:30 AM   (30 min.)   RIS, RC BI2 (RCMM2)   [facility] [place]    RIS BI US 30  11:30 AM   (30 min.)   RIS, RC BI US1 (RCUS1)   [facility] [place] 15     16     17       18     19     20     21     22     23     24       25     26     27     28    INJECTION   8:30 AM   (15 min.)   [facility], [facility] QU[facility]   [facility]    FOLLOW UP VISIT   9:00  AM   (30 min.)   ,  M, NP   [facility] 29     30     07 February 2021  Sunday Monday Tuesday Wednesday Thursday Friday Saturday   1     2     3     4     5     6     7       8     9     10     11     12     13     14       15     16     17    RIS BI MG NEEDLE LOC 60   9:30 AM   (30 min.)   RIS, HH BI1 (HHDG1)   [facility]    RIS BI MG DIAGNOSTIC 30  10:15 AM   (15 min.)   RIS, HH BI1 (HHDG1)   [facility]    MASTECTOMY, PARTIAL, WITH SENTINEL LYMPH NODE BIOPSY   1:00 PM   Olzinski-Kunze, Ann, MD   [facility] OR 18     19     20     21       22     23     24     25    TELEHOME  10:00 AM   (60 min.)   Mallaber, Patricia Renee, NP   [facility] 26     27     28       29     30     FOLLOW UP VISIT   9:00 AM   (15 min.)   Minus Liberty, MD   [facility] 31

## 2021-01-10 ENCOUNTER — Telehealth: Payer: Self-pay

## 2021-01-10 NOTE — Telephone Encounter (Signed)
Left Voicemail Message to offer appointment tomorrow, 1/4, or Friday, 1/6. Asked to call back

## 2021-01-11 ENCOUNTER — Telehealth: Payer: Self-pay | Admitting: Oncology

## 2021-01-11 ENCOUNTER — Ambulatory Visit: Payer: Medicare Other | Attending: Oncology | Admitting: Oncology

## 2021-01-11 ENCOUNTER — Telehealth: Payer: Self-pay

## 2021-01-11 ENCOUNTER — Encounter: Payer: Self-pay | Admitting: Oncology

## 2021-01-11 DIAGNOSIS — Z171 Estrogen receptor negative status [ER-]: Secondary | ICD-10-CM

## 2021-01-11 DIAGNOSIS — C50412 Malignant neoplasm of upper-outer quadrant of left female breast: Secondary | ICD-10-CM | POA: Insufficient documentation

## 2021-01-11 DIAGNOSIS — C50919 Malignant neoplasm of unspecified site of unspecified female breast: Secondary | ICD-10-CM

## 2021-01-11 DIAGNOSIS — D471 Chronic myeloproliferative disease: Secondary | ICD-10-CM | POA: Insufficient documentation

## 2021-01-11 MED ORDER — NICOTINE POLACRILEX 2 MG MT GUM *I*
2.0000 mg | CHEWING_GUM | OROMUCOSAL | 1 refills | Status: DC | PRN
Start: 2021-01-11 — End: 2021-06-16

## 2021-01-11 NOTE — Progress Notes (Signed)
Tobacco Dependence Treatment Program      Telephone Visit     01/11/2021     Location of Patient: home    Location of Telemedicine Provider: home / other    Other participants in telemedicine encounter and roles:  Carly Higgins     This is a new patient visit.    Reason for visit: Nicotine Dependence    Carly Higgins is a 61 y.o. female with a history of MPN s/p PBSCT (2017) and left breast cancer which was diagnosed in 2022. She is scheduled for partial mastectomy on 01/24/2021 and plan is to start radiation treatments 4-6 weeks after surgery.    Historically, Carly Higgins has been a smoker since the age of 61 and she has generally been <10 cigarettes/day smoker.  She did trial the Vuse vape but dnot didn't like it, made her cough/choke.  Of note, she quit smoking when she was admitted for PBSCT and remain abstinent for 2 years.  More recently, she has gone up to 5 days without smoking at all (cold [place], no medications) in the past month or so.  She has been experiencing increasing hip/groin pain and may require a hip replacement this year, she would like to quit for his reason as well.    Patient's problem list, allergies, and medications were reviewed and updated as appropriate. Please see the EHR for full details.    Detailed Tobacco History:   Current Tobacco Use  Social History     Tobacco Use   Smoking Status Every Day    Packs/day: 0.25    Years: 30.00    Pack years: 7.50    Types: Cigarettes   Smokeless Tobacco Never   Tobacco Comments    Quit for 2 years after BMT in 2017     Initial Visit Tobacco History:  Current use History of  Type(s) of Tobacco   [x]  []  Combustible Cigarettes   []  []  Cigars, Cigarillos and little cigars   []  []  Smokeless Tobacco (chewing tobacco, snuf, snus, dip, or pouches)   []  [x]  Electronic Nicotine Delivery Systems (ENDS; ecigarettes, electronic cigarettes, vapes, mods)   []  []  Other (specify):     Carly Higgins reports smoking since she was 61 years old and current usage is as  follows: 6-7 cigarettes per day.  The most she has ever utilized is 10-15 cigarettes per day.  Her first use of tobacco after waking is 10 minutes.    Longest quit:  quit for 2 years after BMT.  Most recent quit: 5 days within the past month.    Date She rates her willingness to stop using tobacco  She rates her ability to stop using tobacco   01/11/2021 7 out of 10 8 out of 10          Tobacco Dependency Treatment History:   -Nicotine Patch:  Has used.  Reports the following: no relief from withdrawal symptoms and no side effects.  -Nicotine Gum:  Has not used.  -Nicotine Lozenge:  Has not used.  -Nicotine Inhaler:  Has not used.   -Nicotine Nasal Spray:  Has not used.  -Bupropion:  Has not used.  -Chantix: Has used.  Reports the following: good relief of symptoms.   -Cold [place]- with success at time of her BMT  -The following are contraindicated for her: N/A    Social History:   Employment status: Disabled since BMT.  Previously worked in urology at [employer]  Living situation: alone with her dog.  Other Smokers in the home:  No  .  Children: none.  Caffeine: coffee 1 /day.  Alcohol use: Rarely  Other drug use: when she was younger     Diagnostics/Testing:         Lab results: 01/04/21  0900   Sodium 140   Potassium 4.3   Chloride 102   CO2 23   UN 14   Creatinine 0.62   Glucose 166*   Calcium 9.4     Assessment/Plan:   MPN s/p PBSCT/Breast Cancer/Tobacco Dependence:  Carly Higgins reports smoking since age 61 and current usage is as follows: 6-7 cigarettes per day.  Scheduled for surgery in the coming weeks.    Discussed the benefits of quitting tobacco specifically related to cancer treatment:  Quitting smoking can make chemotherapy and radiation more effective, improve surgical wound healing, and reduce risk of infection.  Additionally quitting can reduce risk of cancer recurrence and decrease chances of developing a second primary cancer.     We reviewed treatment options including nicotine replacement therapy,  varenicline and bupropion.  Carly Higgins would like to try nicotine gum 2mg  for which she has no apparent contraindications.  We discussed the potential side effects of the medication in detail.  The "Chew and Park" method was reviewed in detail with her today in clinic. Encouraged her to utilize nicotine gum to extend her time to first cigarette as well as to replace cigarettes during the day.  The Nicotine gum are available over the counter and may not be covered by insurance.  I will send in the prescription to see if it may be covered but she is aware that it may be denied.  She can obtain products over the counter if this is the case.     We also discussed the following behavioral strategies:  changing brands, changing smoking patterns, delaying the first cigarette in the morning and using distractions.      Clinic follow up was requested in 3 week(s) and she will call my office in the meantime with any questions or concerns.  I will call her too check on her in about a week to see how things are going.    I have mailed the following handouts and materials: Quit Audiological scientist, Link to [email or web address], Contact information for [employer], Download information for mobile apps and texing resources, Tobacco Use Log.    The plan was discussed with the patient and the patient/patient rep demonstrated understanding to the provider's satisfaction.    Consent was previously obtained from the patient to complete this telephone consult; including the potential for financial liability.    21+ minutes was spent on the phone with the patient, patient representatives, and/or other attendees.     Thank you for allowing me to participate in the care of your patients.  If I can be of any assistance please contact me.    Isac Caddy, DNP, ANP-BC, CTTS, NCTTP  Program Director Tobacco Dependence Treatment Program  Nurse Practitioner/Certified Tobacco Treatment Specialist  [employer]  [phone]

## 2021-01-11 NOTE — Telephone Encounter (Signed)
Spoke to patient about Carly Higgins appointment today at 4pm via video. Patient does not have zoom. Patient wants to do in person next available.

## 2021-01-12 ENCOUNTER — Ambulatory Visit: Payer: Medicare Other | Admitting: Sports Medicine

## 2021-01-12 ENCOUNTER — Other Ambulatory Visit: Payer: Self-pay

## 2021-01-12 ENCOUNTER — Encounter: Payer: Self-pay | Admitting: Oncology

## 2021-01-12 ENCOUNTER — Encounter: Payer: Self-pay | Admitting: Sports Medicine

## 2021-01-12 VITALS — BP 118/72 | HR 95 | Ht 66.0 in | Wt 250.0 lb

## 2021-01-12 DIAGNOSIS — M1610 Unilateral primary osteoarthritis, unspecified hip: Secondary | ICD-10-CM

## 2021-01-12 DIAGNOSIS — M160 Bilateral primary osteoarthritis of hip: Secondary | ICD-10-CM

## 2021-01-12 MED ORDER — LIDOCAINE HCL 1 % IJ SOLN *I*
5.0000 mL | Freq: Once | INTRAMUSCULAR | Status: AC | PRN
Start: 2021-01-12 — End: 2021-01-12
  Administered 2021-01-12: 5 mL via INTRA_ARTICULAR

## 2021-01-12 MED ORDER — BETAMETHASONE ACET & SOD PHOS 6 (3-3) MG/ML IJ SUSP *I*
12.0000 mg | Freq: Once | INTRAMUSCULAR | Status: AC | PRN
Start: 2021-01-12 — End: 2021-01-12
  Administered 2021-01-12: 12 mg via INTRA_ARTICULAR

## 2021-01-12 MED ORDER — LIDOCAINE HCL 1 % IJ SOLN *I*
1.0000 mL | Freq: Once | INTRAMUSCULAR | Status: AC | PRN
Start: 2021-01-12 — End: 2021-01-12
  Administered 2021-01-12: 1 mL via INTRA_ARTICULAR

## 2021-01-12 NOTE — Progress Notes (Signed)
Chief Complaint:  Hip pain.    History of Present Illness:  This is a 61 y.o. female who presents for follow up regarding her left > right hip pain. Last seen on 08/31/19 for this complaint. She received bilateralt intra-articular hip injection has been doing relatively well with them, endorsing partial relief that is still lasting.  She endorses significant persisting left-sided symptoms that did not respond to the injection.  These symptoms are at the lateral hip, though the gluts and leg.  This is worse when trying to sleep at night.  Patient does not want to explore surgical options at this point in time.  Denies numbness/tingling.       Treatment History:    Physical therapy: too painful   Yes _0  No _1  Helpful _2  Somewhat helpful _3  Not helpful _4     Exercise: too painful  Yes _5  No _6  Helpful _7  Somewhat helpful _8  Not helpful _9     Activity modification:   Yes _10  No _11  Helpful _12  Somewhat helpful _13  Not helpful _14     Brace:  Yes _15   No _16   Helpful _17   Somewhat helpful _18   Not helpful _19      Acetaminophen:   Yes _20  No _21  Helpful _22  Somewhat helpful _23  Not helpful _24     NSAIDs:  Ibuprofen 825m daily   Yes _25  No _26  Helpful _27  Somewhat helpful _28  Not helpful _29      Topicals:  Yes _30   No _31   Helpful _32   Somewhat helpful _33   Not helpful _34      Other Medications: Lexapro  Yes _35   No _36   Helpful _37   Somewhat helpful _38   Not helpful _39      Alternative Treatments:  Yes _40   No _41   Helpful _42   Somewhat helpful _43   Not helpful _44      Corticosteroid injections:   Right intra-articular hip 08/31/19 and greater trochanteric bursa 06/25/19.  There is better, long lasting improvement with intra-articular injections.   Yes _45  No _46  Helpful _47  Somewhat helpful _48  Not helpful _49     Viscosupplementation:   Yes _50  No _51  Helpful _52  Somewhat helpful _53  Not helpful _54     Radiofrequency Ablation:  Yes _55   No _56   Helpful _57   Somewhat helpful _58   Not helpful _59      Surgery:   Yes _60  No _61  Helpful _62  Somewhat helpful _63   Not helpful _64     Evaluation to date: I personally reviewed patient's updated x-rays which demonstrate progression of advanced bilateral hip osteoarthritis, with joint space narrowing and subchondral cystic changes.  Moderate osteophytosis.  Radiologist report is not available for review.      MRI L hip 12/14/16  1. Moderate degenerative disease of the left hip with associated acetabular cartilage loss, degenerative tearing of the labrum and a para labral cyst posterosuperiorly.      2. Mild soft tissue inflammation superficial to the greater trochanter without organized fluid collection. Moderate tendinopathy of the gluteus minimus tendon at the insertion on the greater trochanter. The findings may correlate with symptoms of greater   trochanteric bursitis.      3. No avascular necrosis or insufficiency fracture. No joint effusion.      END REPORT       MRI R hip 12/14/16  1. Moderate degenerative disease of the right hip with a focal area of chondral loss involving the anterosuperior acetabulum and subchondral cysts.      2. Mild soft tissue inflammation superficial to the greater trochanter without  organized fluid collection. Mild tendinopathy of the gluteus medius and minimus tendons at the insertion on the greater trochanter. The findings may correlate with symptoms of   greater trochanteric bursitis.      3. No avascular necrosis or insufficiency fracture. No joint effusion.      END REPORT         Past Medical History:  Past Medical History:   Diagnosis Date    Atrial fibrillation     fib/flutter 05/2015    Breast cancer     CHF (congestive heart failure)     pulmonary edema 05/28/15    GERD (gastroesophageal reflux disease)     Hypoxia 06/11/2015    Impaired mobility and ADLs 06/11/2015    Leukocytosis 09/22/2013    post allogenic MUD (F)  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    Conditioned w/ Busulfan 130 mg/m2/d and Fludarabine 40 mg/m2/d x4 days followed by 10/10 HLA MUD (F) Allogeneic PBSCT  (pt/donor: ABO: A+/A+, CMV N/N)    Pulmonary edema     Shortness of breath     Tobacco use        Past Surgical History:  Past Surgical History:   Procedure Laterality Date    ROTATOR CUFF REPAIR Right        Social History:  Social History     Socioeconomic History    Marital status: Divorced   Occupational History    Occupation: on disability   Tobacco Use    Smoking status: Every Day     Packs/day: 0.25     Years: 30.00     Pack years: 7.50     Types: Cigarettes    Smokeless tobacco: Never    Tobacco comments:     Quit for 2 years after BMT in 2017   Substance and Sexual Activity    Alcohol use: Yes     Comment: occasional    Drug use: No    Sexual activity: Not Currently   Social History Narrative        Marital status: single    Lives in a second floor apartment alone     No children    Pets:  1 dog    Hobbies and interests include nothing at this time    Physical activity: none at this time         Family History:  Family History   Problem Relation Age of Onset    No Known Problems Mother     Throat cancer Father     No Known Problems Sister     No Known Problems Brother     Breast cancer Paternal Aunt     Ovarian cancer Neg Hx        Review of Systems:  A complete 12 point review of systems is negative except as in HPI and listed below:  Bilateral Hip pain      Medications:    Allergies:  No Known Allergies (drug, envir, food or latex)      OBJECTIVE:   Vital signs:    Vitals:    01/12/21 1309   BP: 118/72   Pulse: 95   Weight: 113.4 kg (250 lb)   Height: 1.676 m (_0 )     General: no acute distress female  Mental Status:  Alert and oriented x3, pleasant and cooperative with exam  Extremities: All 4 extremities without edema, clubbing or cyanosis  Gait: Minimally antalgic, able to toe walk, heel walk.  Spine: No swelling, erythema, ecchymosis or step-off deformity.  Patient is nontender to palpation midline or over paravertebral soft tissue.  Nontender to palpation over SI joints  bilaterally.  Full active range of motion, increased pain with left lateral bending through the groin and gluts.  Lower extremity strength is 4+/5, symmetric compared to contralateral leg.  Neurovascularly intact.    B/L Hip and Pelvis:  No swelling, erythema, ecchymosis or deformity. No warmth. TTP: Greater Trochanter: yes. There is some diminished range with pain.  Neurovascularly intact.  Decreased strength with hip flexion, extension, abduction and adduction, increased pain with abduction against resistance.     Patrick's / FABER test: positive.     FADIR test: positive.    Hip scouring: positive.        Assessment:  This is a 61 y.o. female with chronic bilateral L > R hip pain secondary to hip arthritis and gluteal tendinopathy.  She has better pain relief with ultrasound-guided intra-articular injections compared to trochanteric.    Plan:  -Encouraged physical therapy and weight loss, patient elects to contact the office for referral when she is planning to start  -Tylenol/OTC topicals and occasional NSAIDs as needed for pain  -Reviewed the role of repeat injection, she only had a few days relief with trochanteric injections therefore these are deferred.  She is eligible for repeat intra-articular injections after 12/01/2019.  She will contact the office around this time she would like to proceed  -Follow-up: As needed for repeat bilateral intra-articular hip injections after 12/01/2019, sooner if needed    Thank you for allowing [place] to take part in this patient's care and contact us if you have any questions.      Preop Quality Metrics:     Patients < 73 years old--HbA1c ?7   Patients 70 years and older--HbA1c ?7.5   If HbA1c is >7.5 -8, dialogue with Primary Care Physician is required-- please document   BMI < 40   No active infections in body   All medical conditions optimized   Smoking < 1 ppd (cessation encouraged)   Alcohol < 3 units/day   Not using IV drugs   Immunosuppressive medications stopped 1-6  weeks preop   Albumin > 3

## 2021-01-12 NOTE — Procedures (Signed)
Large Joint Aspiration/Injection Procedure: R hip joint    Date/Time: 01/12/2021  1:15 PM EST  Consent given by: patient  Site marked: site marked  Timeout: Immediately prior to procedure a time out was called to verify the correct patient, procedure, equipment, support staff and site/side marked as required     Procedure Details    Location: hip - R hip joint  Preparation: The site was prepped using the usual aseptic technique.  Ultrasound guidance:  Ultrasound was utilized to improve needle visualization, injection accuracy, and anatomic localization.    Anesthetics administered: 5 mL lidocaine HCL 1 %; 1 mL lidocaine HCL 1 %  Intra-Articular Steroids administered: 12 mg betamethasone acetate-betamethasone sodium phosphate 6 (3-3) mg/mL  Dressing:  A dry, sterile dressing was applied.  Patient tolerance: patient tolerated the procedure well with no immediate complications

## 2021-01-17 ENCOUNTER — Encounter: Payer: Self-pay | Admitting: Oncology

## 2021-01-19 ENCOUNTER — Telehealth: Payer: Self-pay | Admitting: Oncology

## 2021-01-19 NOTE — Telephone Encounter (Signed)
01/19/2021   Tobacco Dependence Treatment Program     Reached out to patient via phone to see how things are going.  I left message for her to call back or mychart message me if any questions/concerns regarding tobacco cessation.  Carly Keller, NP

## 2021-01-20 NOTE — H&P (View-Only) (Signed)
Anesthesia Pre-operative History and Physical for Carly Higgins    Highlighted Issues for this Procedure:  61 y.o. female with Malignant neoplasm of upper-outer quadrant of left breast in female, estrogen receptor negative (C50.412, Z17.1) presenting for Procedure(s) (LRB):  LEFT NEEDLE LOC PARTIAL MASTECTOMY WITH SENTINEL LYMPH NODE BIOPSY (WIRE@12  (NUC@1PM  (Left)  POSSIBLE LEFT ALND (Left) by Surgeon(s):  Minus Liberty, MD scheduled for 135 minutes.     Stress Test/Echocardiography:  TTE (07/05/20):  Normal left ventricular size and systolic function with no significant regional wall motion abnormalities.  Calculated LVEF is 65%.  Normal right ventricular size and function.  Aortic and mitral valve sclerosis without stenosis.  Unable to estimate RV systolic pressure.  Borderline enlarged ascending aorta.  Compared to the prior echo (11/22/2015), the ascending aorta is now borderline enlarged.      .  .  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, now resolved    GI/HEPATIC/RENAL   NPO: > 8hrs ago (solids) and > 2hrs ago (clears)      + GERD    + Nausea          chronic  Pertinent(-):  No  esophageal issues  NEURO/PSYCH/ORTHO    + Neuropsychiatric Issues          anxiety    + Peripheral Nerve Issue          peripheral neuropathy  Pertinent(-):  No chronic pain or cerebrovascular event    ENDO/OTHER    + Diabetes Mellitus          Type 2 well controlled  no insulin HgA1c: 5.1    + Thyroid Disease          HYPOthyroid well controlled    + Chemo Hx          doxorubicin    HEMATOLOGIC    + Anticoagulants/Antiplatelets          ASA    + Blood dyscrasia    + Arthritis  Pertinent(-):  No coagulopathy         Physical Exam Not Completed________________________________________________________________________  PLAN  ASA Score  3  Anesthetic Plan general       Induction (routine IV) General Anesthesia/Sedation Maintenance Plan (inhaled agents and propofol infusion);  Airway Manipulation (direct laryngoscopy); Airway (cuffed ETT); Line ( use current access); Monitoring (standard ASA); Positioning (supine and arms out); PONV Plan (dexamethasone, ondansetron, scopolamine and propofol infusion); Pain (per surgical team, intraop local, nerve block and PO Tylenol); PostOp (PACU)    Anesthesia Consent Not Performed

## 2021-01-20 NOTE — Anesthesia Preprocedure Evaluation (Addendum)
Anesthesia Pre-operative History and Physical for Carly Higgins    Highlighted Issues for this Procedure:  61 y.o. female with Malignant neoplasm of upper-outer quadrant of left breast in female, estrogen receptor negative (C50.412, Z17.1) presenting for Procedure(s) (LRB):  LEFT NEEDLE LOC PARTIAL MASTECTOMY WITH SENTINEL LYMPH NODE BIOPSY (WIRE@12  (NUC@1PM  (Left)  POSSIBLE LEFT ALND (Left) by Surgeon(s):  Sheliah Mends, MD scheduled for 135 minutes.     Stress Test/Echocardiography:  TTE (07/05/20):  Normal left ventricular size and systolic function with no significant regional wall motion abnormalities.  Calculated LVEF is 65%.  Normal right ventricular size and function.  Aortic and mitral valve sclerosis without stenosis.  Unable to estimate RV systolic pressure.  Borderline enlarged ascending aorta.  Compared to the prior echo (11/22/2015), the ascending aorta is now borderline enlarged.      .  .  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, now resolved    GI/HEPATIC/RENAL   NPO: > 8hrs ago (solids) and > 2hrs ago (clears)      + GERD    + Nausea          chronic  Pertinent(-):  No  esophageal issues  NEURO/PSYCH/ORTHO    + Neuropsychiatric Issues          anxiety    + Peripheral Nerve Issue          peripheral neuropathy  Pertinent(-):  No chronic pain or cerebrovascular event    ENDO/OTHER    + Diabetes Mellitus          Type 2 well controlled  no insulin HgA1c: 5.1    + Thyroid Disease          HYPOthyroid well controlled    + Chemo Hx          doxorubicin    HEMATOLOGIC    + Anticoagulants/Antiplatelets          ASA    + Blood dyscrasia    + Arthritis  Pertinent(-):  No coagulopathy         Physical Exam    Airway            Mouth opening: normal            Mallampati: I            TM distance (fb): >3 FB            Neck ROM: full            Airway Impression: easy  Dental   Normal Exam            Mental Status     oriented to person, place and time         ________________________________________________________________________  PLAN  ASA Score  3  Anesthetic Plan general       Induction (modified RSI)  General Anesthesia/Sedation Maintenance Plan (inhaled agents and propofol infusion);  Airway Manipulation (direct laryngoscopy); Airway (cuffed ETT); Line ( use current access); Monitoring (standard ASA); Positioning (supine and arms out); PONV Plan (dexamethasone, ondansetron, scopolamine and propofol infusion); Pain (intraop local, nerve block and PO Tylenol); PostOp (PACU)    Informed Consent     Risks:        Risks discussed were commensurate with the plan listed above with the following specific points: N/V, aspiration, sore throat and hypotension, Damage to: teeth, nerves and blood vessels.    Anesthetic Consent:         Anesthetic plan (and risks as noted above) were discussed with patient    Plan also discussed with team members including:       surgeon    Responsible Anesthesia Provider Attestation:  I attest that the patient or proxy understands and accepts the risks and benefits of the anesthesia plan. I also attest that I have personally performed a pre-anesthetic examination and evaluation, and prescribed the anesthetic plan for this particular location within 48 hours prior to the anesthetic as documented. Waverly Ferrari. Griffin Basil, MD  01/24/21, 2:49 PM

## 2021-01-21 ENCOUNTER — Other Ambulatory Visit: Payer: Medicare Other

## 2021-01-21 ENCOUNTER — Ambulatory Visit: Payer: Medicare Other | Attending: Surgical Oncology

## 2021-01-21 DIAGNOSIS — Z01812 Encounter for preprocedural laboratory examination: Secondary | ICD-10-CM | POA: Insufficient documentation

## 2021-01-21 DIAGNOSIS — Z20822 Contact with and (suspected) exposure to covid-19: Secondary | ICD-10-CM | POA: Insufficient documentation

## 2021-01-21 DIAGNOSIS — Z20828 Contact with and (suspected) exposure to other viral communicable diseases: Secondary | ICD-10-CM | POA: Insufficient documentation

## 2021-01-21 DIAGNOSIS — Z01818 Encounter for other preprocedural examination: Secondary | ICD-10-CM | POA: Insufficient documentation

## 2021-01-21 LAB — COVID-19 NAAT (PCR): COVID-19 NAAT (PCR): NEGATIVE

## 2021-01-24 ENCOUNTER — Ambulatory Visit: Payer: Medicare Other

## 2021-01-24 ENCOUNTER — Ambulatory Visit: Payer: Medicare Other | Admitting: Student in an Organized Health Care Education/Training Program

## 2021-01-24 ENCOUNTER — Ambulatory Visit
Admission: RE | Admit: 2021-01-24 | Discharge: 2021-01-24 | Disposition: A | Discharge status: Home / Self Care | Payer: Medicare Other | Source: Ambulatory Visit | Attending: Surgical Oncology | Admitting: Surgical Oncology

## 2021-01-24 ENCOUNTER — Other Ambulatory Visit: Payer: Self-pay

## 2021-01-24 ENCOUNTER — Encounter
Admission: RE | Disposition: A | Discharge status: Home / Self Care | Payer: Self-pay | Source: Ambulatory Visit | Attending: Surgical Oncology

## 2021-01-24 ENCOUNTER — Other Ambulatory Visit: Payer: No Typology Code available for payment source

## 2021-01-24 DIAGNOSIS — N6321 Unspecified lump in the left breast, upper outer quadrant: Secondary | ICD-10-CM

## 2021-01-24 DIAGNOSIS — Z7982 Long term (current) use of aspirin: Secondary | ICD-10-CM | POA: Insufficient documentation

## 2021-01-24 DIAGNOSIS — C50412 Malignant neoplasm of upper-outer quadrant of left female breast: Secondary | ICD-10-CM

## 2021-01-24 DIAGNOSIS — Z171 Estrogen receptor negative status [ER-]: Secondary | ICD-10-CM

## 2021-01-24 DIAGNOSIS — G8918 Other acute postprocedural pain: Secondary | ICD-10-CM

## 2021-01-24 DIAGNOSIS — N6489 Other specified disorders of breast: Secondary | ICD-10-CM

## 2021-01-24 DIAGNOSIS — D471 Chronic myeloproliferative disease: Secondary | ICD-10-CM

## 2021-01-24 DIAGNOSIS — F17210 Nicotine dependence, cigarettes, uncomplicated: Secondary | ICD-10-CM | POA: Insufficient documentation

## 2021-01-24 DIAGNOSIS — E119 Type 2 diabetes mellitus without complications: Secondary | ICD-10-CM | POA: Insufficient documentation

## 2021-01-24 DIAGNOSIS — R920 Mammographic microcalcification found on diagnostic imaging of breast: Secondary | ICD-10-CM

## 2021-01-24 DIAGNOSIS — Z853 Personal history of malignant neoplasm of breast: Secondary | ICD-10-CM

## 2021-01-24 DIAGNOSIS — N6012 Diffuse cystic mastopathy of left breast: Secondary | ICD-10-CM

## 2021-01-24 DIAGNOSIS — Z6841 Body Mass Index (BMI) 40.0 and over, adult: Secondary | ICD-10-CM | POA: Insufficient documentation

## 2021-01-24 HISTORY — PX: PR AXILLARY LYMPHADENECTOMY COMPLETE: 38745

## 2021-01-24 HISTORY — PX: PR MASTECTOMY PARTIAL: 19301

## 2021-01-24 LAB — POCT GLUCOSE
Glucose POCT: 100 mg/dL — ABNORMAL HIGH (ref 60–99)
Glucose POCT: 112 mg/dL — ABNORMAL HIGH (ref 60–99)

## 2021-01-24 SURGERY — MASTECTOMY, PARTIAL, WITH SENTINEL LYMPH NODE BIOPSY
Anesthesia: General | Site: Breast | Laterality: Left | Wound class: Clean

## 2021-01-24 MED ORDER — PHENYLEPHRINE 100 MCG/ML IN NS 10 ML *WRAPPED*
INTRAMUSCULAR | Status: DC | PRN
Start: 2021-01-24 — End: 2021-01-24
  Administered 2021-01-24 (×2): 100 ug via INTRAVENOUS

## 2021-01-24 MED ORDER — SODIUM CHLORIDE 0.9 % 25 ML IV SOLN *I*
5.0000 mg | Freq: Once | INTRAVENOUS | Status: DC | PRN
Start: 2021-01-24 — End: 2021-01-24

## 2021-01-24 MED ORDER — DEXAMETHASONE SODIUM PHOSPHATE 4 MG/ML INJ SOLN *WRAPPED*
INTRAMUSCULAR | Status: DC | PRN
Start: 2021-01-24 — End: 2021-01-24
  Administered 2021-01-24: 8 mg via INTRAVENOUS

## 2021-01-24 MED ORDER — LACTATED RINGERS IV SOLN *I*
75.0000 mL/h | INTRAVENOUS | Status: DC
Start: 2021-01-24 — End: 2021-01-25
  Administered 2021-01-24: 75 mL/h via INTRAVENOUS

## 2021-01-24 MED ORDER — METHYLENE BLUE 5 MG/ML (0.5%) IV SOLN *I*
INTRAVENOUS | Status: DC | PRN
Start: 2021-01-24 — End: 2021-01-24
  Administered 2021-01-24: 223.2 mg via SUBCUTANEOUS

## 2021-01-24 MED ORDER — SCOPOLAMINE BASE 1.5 MG TD PT72 *I*
1.0000 | MEDICATED_PATCH | Freq: Once | TRANSDERMAL | Status: DC
Start: 2021-01-24 — End: 2021-01-25
  Administered 2021-01-24: 1 via TRANSDERMAL
  Filled 2021-01-24: qty 1

## 2021-01-24 MED ORDER — FENTANYL CITRATE 50 MCG/ML IJ SOLN *WRAPPED*
INTRAMUSCULAR | Status: DC | PRN
Start: 2021-01-24 — End: 2021-01-24
  Administered 2021-01-24: 50 ug via INTRAVENOUS
  Administered 2021-01-24: 25 ug via INTRAVENOUS

## 2021-01-24 MED ORDER — TECHNETIUM TC-99M TILMANOCEPT (LYMPHOSEEK) IV *I*
1.0000 | Freq: Once | INTRAVENOUS | Status: AC | PRN
Start: 2021-01-24 — End: 2021-01-24
  Administered 2021-01-24: 1 via INTRADERMAL

## 2021-01-24 MED ORDER — LIDOCAINE HCL (PF) 1 % IJ SOLN *I*
0.1000 mL | INTRAMUSCULAR | Status: DC | PRN
Start: 2021-01-24 — End: 2021-01-25
  Administered 2021-01-24: 0.1 mL via SUBCUTANEOUS
  Filled 2021-01-24: qty 2

## 2021-01-24 MED ORDER — ONDANSETRON HCL 2 MG/ML IV SOLN *I*
INTRAMUSCULAR | Status: DC | PRN
Start: 2021-01-24 — End: 2021-01-24
  Administered 2021-01-24: 4 mg via INTRAMUSCULAR

## 2021-01-24 MED ORDER — ALBUTEROL SULFATE (2.5 MG/3ML) 0.083% IN NEBU *I*
2.5000 mg | INHALATION_SOLUTION | Freq: Once | RESPIRATORY_TRACT | Status: DC | PRN
Start: 2021-01-24 — End: 2021-01-24

## 2021-01-24 MED ORDER — ACETAMINOPHEN 500 MG PO TABS *I*
1000.0000 mg | ORAL_TABLET | Freq: Three times a day (TID) | ORAL | 0 refills | Status: AC | PRN
Start: 2021-01-24 — End: 2021-02-23
  Filled 2021-01-24: qty 100, 16d supply, fill #0

## 2021-01-24 MED ORDER — ACETAMINOPHEN 650 MG/20.3 ML WRAPPED *I*
975.0000 mg | Freq: Once | Status: AC
Start: 2021-01-24 — End: 2021-01-24

## 2021-01-24 MED ORDER — MIDAZOLAM HCL 1 MG/ML IJ SOLN *I* WRAPPED
INTRAMUSCULAR | Status: AC
Start: 2021-01-24 — End: 2021-01-24
  Filled 2021-01-24: qty 2

## 2021-01-24 MED ORDER — LIDOCAINE HCL 2 % IJ SOLN *I*
INTRAMUSCULAR | Status: DC | PRN
Start: 2021-01-24 — End: 2021-01-24
  Administered 2021-01-24: 100 mg via INTRAVENOUS

## 2021-01-24 MED ORDER — PROPOFOL INFUSION 10 MG/ML *I*
INTRAVENOUS | Status: DC | PRN
Start: 2021-01-24 — End: 2021-01-24
  Administered 2021-01-24: 75 ug/kg/min via INTRAVENOUS
  Administered 2021-01-24: 100 ug/kg/min via INTRAVENOUS

## 2021-01-24 MED ORDER — ACETAMINOPHEN 500 MG PO TABS *I*
1000.0000 mg | ORAL_TABLET | Freq: Three times a day (TID) | ORAL | Status: DC
Start: 2021-01-24 — End: 2021-01-25

## 2021-01-24 MED ORDER — BUPIVACAINE HCL 0.5 % IJ SOLUTION *WRAPPED*
INTRAMUSCULAR | Status: AC
Start: 2021-01-24 — End: 2021-01-24
  Filled 2021-01-24: qty 30

## 2021-01-24 MED ORDER — PROPOFOL 10 MG/ML IV EMUL (INTERMITTENT DOSING) WRAPPED *I*
INTRAVENOUS | Status: DC | PRN
Start: 2021-01-24 — End: 2021-01-24
  Administered 2021-01-24: 20 mg via INTRAVENOUS
  Administered 2021-01-24: 40 mg via INTRAVENOUS
  Administered 2021-01-24: 20 mg via INTRAVENOUS
  Administered 2021-01-24: 250 mg via INTRAVENOUS
  Administered 2021-01-24: 50 mg via INTRAVENOUS

## 2021-01-24 MED ORDER — ASPIRIN 81 MG PO TBEC *I*
81.0000 mg | DELAYED_RELEASE_TABLET | Freq: Every day | ORAL | Status: DC
Start: 2021-01-26 — End: 2021-10-21

## 2021-01-24 MED ORDER — DEXTROSE 5 % FLUSH FOR PUMPS *I*
0.0000 mL/h | INTRAVENOUS | Status: DC | PRN
Start: 2021-01-24 — End: 2021-01-25

## 2021-01-24 MED ORDER — FENTANYL CITRATE 50 MCG/ML IJ SOLN *WRAPPED*
25.0000 ug | INTRAMUSCULAR | Status: DC | PRN
Start: 2021-01-24 — End: 2021-01-25
  Administered 2021-01-24 (×2): 25 ug via INTRAVENOUS
  Filled 2021-01-24 (×2): qty 2

## 2021-01-24 MED ORDER — ALBUTEROL SULFATE HFA 108 (90 BASE) MCG/ACT IN AERS *I*
INHALATION_SPRAY | RESPIRATORY_TRACT | Status: DC | PRN
Start: 2021-01-24 — End: 2021-01-24
  Administered 2021-01-24: 10 via RESPIRATORY_TRACT

## 2021-01-24 MED ORDER — SUGAMMADEX SODIUM 100 MG/1ML IV SOLN *WRAPPED*
INTRAVENOUS | Status: DC | PRN
Start: 2021-01-24 — End: 2021-01-24
  Administered 2021-01-24: 200 mg via INTRAVENOUS

## 2021-01-24 MED ORDER — IBUPROFEN 600 MG PO TABS *I*
600.0000 mg | ORAL_TABLET | Freq: Four times a day (QID) | ORAL | 0 refills | Status: AC | PRN
Start: 2021-01-24 — End: 2021-02-23
  Filled 2021-01-24: qty 30, 7d supply, fill #0

## 2021-01-24 MED ORDER — ROCURONIUM BROMIDE 10 MG/ML IV SOLN *WRAPPED*
Status: DC | PRN
Start: 2021-01-24 — End: 2021-01-24
  Administered 2021-01-24: 30 mg via INTRAVENOUS

## 2021-01-24 MED ORDER — SODIUM CHLORIDE 0.9 % IV SOLN WRAPPED *I*
20.0000 mL/h | Status: DC
Start: 2021-01-24 — End: 2021-01-25

## 2021-01-24 MED ORDER — FENTANYL CITRATE 50 MCG/ML IJ SOLN *WRAPPED*
INTRAMUSCULAR | Status: AC
Start: 2021-01-24 — End: 2021-01-24
  Filled 2021-01-24: qty 2

## 2021-01-24 MED ORDER — SODIUM CHLORIDE 0.9 % FLUSH FOR PUMPS *I*
0.0000 mL/h | INTRAVENOUS | Status: DC | PRN
Start: 2021-01-24 — End: 2021-01-25

## 2021-01-24 MED ORDER — BUPIVACAINE HCL 0.25% IJ SOLN (FOR OSM) *I*
Status: DC | PRN
Start: 2021-01-24 — End: 2021-01-24
  Administered 2021-01-24: 20 mL via PERINEURAL
  Administered 2021-01-24: 10 mL via PERINEURAL

## 2021-01-24 MED ORDER — CEFAZOLIN 2000 MG IN STERILE WATER 20ML SYRINGE *I*
2000.0000 mg | PREFILLED_SYRINGE | Freq: Once | INTRAVENOUS | Status: AC
Start: 2021-01-24 — End: 2021-01-24
  Administered 2021-01-24: 2000 mg via INTRAVENOUS
  Filled 2021-01-24: qty 20

## 2021-01-24 MED ORDER — PROCHLORPERAZINE MALEATE 10 MG PO TABS *I*
10.0000 mg | ORAL_TABLET | Freq: Four times a day (QID) | ORAL | 5 refills | Status: DC | PRN
Start: 2021-01-24 — End: 2021-06-13
  Filled 2021-01-24: qty 30, 7d supply, fill #0

## 2021-01-24 MED ORDER — LABETALOL HCL 20 MG/4 ML IV SOLN WRAPPED *I*
INTRAVENOUS | Status: AC
Start: 2021-01-24 — End: 2021-01-24
  Filled 2021-01-24: qty 4

## 2021-01-24 MED ORDER — LACTATED RINGERS IV SOLN *I*
125.0000 mL/h | INTRAVENOUS | Status: DC
Start: 2021-01-24 — End: 2021-01-24
  Administered 2021-01-24: 125 mL/h via INTRAVENOUS

## 2021-01-24 MED ORDER — SUCCINYLCHOLINE CHLORIDE 20 MG/ML IV/IJ SOLN *WRAPPED*
Status: DC | PRN
Start: 2021-01-24 — End: 2021-01-24
  Administered 2021-01-24: 120 mg via INTRAVENOUS

## 2021-01-24 MED ORDER — HALOPERIDOL LACTATE 5 MG/ML IJ SOLN *I*
0.5000 mg | Freq: Once | INTRAMUSCULAR | Status: DC | PRN
Start: 2021-01-24 — End: 2021-01-24

## 2021-01-24 MED ORDER — ACETAMINOPHEN 500 MG PO TABS *I*
1000.0000 mg | ORAL_TABLET | Freq: Once | ORAL | Status: AC
Start: 2021-01-24 — End: 2021-01-24
  Administered 2021-01-24: 1000 mg via ORAL
  Filled 2021-01-24: qty 2

## 2021-01-24 SURGICAL SUPPLY — 33 items
ADHESIVE SKIN CLOSURE 0.7ML DERMABOND ADVANCED (Supply) ×3 IMPLANT
APPLIER CLIP MED SHORT 9-3/8IN (Supply) ×3 IMPLANT
BANDAGE ELAST SLF-CLSR 6X11 LF NONSTER (Dressing) ×3 IMPLANT
BLANKET LOWER BODY TEMP THERAPY (Supply) ×3 IMPLANT
CLIP LIGATING TI HORIZON WIDE SLOT RED SM (Supply) ×3 IMPLANT
COVER PROBE W/GEL AND BANDS 6X96 (Supply) ×3 IMPLANT
DRAPE BREAST/CHEST 15X10 FENESTRATED (Drape) ×3 IMPLANT
DRAPE SHEET 53X77 (Drape) ×2
DRAPE SUR 3 QTR W53XL77IN SMS POLYPR ~~LOC~~ (Drape) ×4 IMPLANT
ELECTRODE ADULT POLYHESIVE PAT RETURN (Supply) ×3 IMPLANT
FILTER NEPTUNE 4PORT MANIFOLD (Supply) ×3 IMPLANT
GLOVE BIOGEL PI MICRO SZ 7.5 (Glove) ×3 IMPLANT
GLOVE SURG BIOGEL PI ULTRATOUCH SZ 6.5 (Glove) ×3 IMPLANT
GOWN PACK XL (Gown) ×1
GOWN SIRUS NONREINFORCED SMALL (Gown) ×3 IMPLANT
KIT SKIN SCRUB (Supply) ×1
NEEDLE HYPO LF 22G X 1.5IN BLACK (Needle) ×3 IMPLANT
PACK CUSTOM GENERAL PACK (Pack) ×3 IMPLANT
PACK GOWN SURGICAL XL (Gown) ×2 IMPLANT
PREP CHG DYNA-HEX LIQUID 4PCT 4OZ (Supply) ×3 IMPLANT
SLEEVE COMP KNEE MED BLENDED (Supply) ×3 IMPLANT
SPONGE LAPAROTOMY W18XL18IN 7IN LOOP WHITE COTTON 4 PLY RADIOPAQUE PREWASHED DELINTED (Sponge) ×2 IMPLANT
SPONGE XRAY DETACHABLE 18X18 (Sponge) ×1
STAPLER SKIN 35W NL (Supply) ×1
STAPLER SKIN WIDE STPL LEG L3.9MM WIRE DIA0.58MM FIX HD PROX (Supply) ×2 IMPLANT
SUCTION YANKAUER BULB TIP WITH ON/OFF CONTROL STERILE (Supply) ×3 IMPLANT
SUTR MONOCRYL PLUS 4-0 18PS2 (Suture) ×9 IMPLANT
SUTR SILK 2-0 SH 30 IN BLACK (Suture) ×3 IMPLANT
SUTR VICRYL ANTIB 3-0 SH 18 VIOLET (Suture) ×3 IMPLANT
SUTR VICRYL ANTIB 3-0 SH 27 UNDY (Suture) ×6 IMPLANT
SYRINGE IRRIG 60ML BULB SOFT PLIABLE ONE HANDED USE W/TYVEK LID (Syringe) ×6 IMPLANT
TIP YANKAUER SUCT BULBOUS ON/OFF LF (Supply) ×3 IMPLANT
TRAY SCRUB DRY SKIN INCLUDES 6 WINGED SPONGES 6 SPONGE STICKS 2 COTTON TIP APPLICATORS ABSORBENT/BLOTTING TOWELS PREMIUM (Supply) ×2 IMPLANT

## 2021-01-24 NOTE — Discharge Instructions (Signed)
[facility]  Telephone: [phone]    Partial Mastectomy with or without Sentinel Lymph Node Biopsy  Postoperative Instructions    Wound Care:    The wound is sealed with tissue glue. Do not pick at the glue. Do not put any creams, lotions or ointments over the glue. It will begin to peel off on its own after 1-2 weeks, at which point you can pull it off.     You may shower beginning the day after your surgery. Pat your incision(s) dry afterwards. Do not soak your incision(s) in a tub, hot tub, or pool for 7 days after your surgery.      The stitches are all under the skin and will dissolve on their own. They do not need to be removed.     You may benefit from wearing a sports bra or snuggly fitting bra around the clock for the first 3-5 days. This will compress the breast and may keep you much more comfortable.     Avoid jogging, aerobics or other strenuous activity for 2 weeks to allow the breast to heal. Walking or exercising on a stationary bicycle is permitted. (In other words, do not take part in activities that shake up your breast too much. It could cause bleeding and pain). If you had a sentinel node biopsy, do not do any heavy (anything heavier than a gallon of milk) lifting or vigorous upper body exercise for 2 weeks to allow the incision in the armpit to heal.     If you had a sentinel node biopsy and blue dye was used, you may note a green or blue color to your urine or bowel movements for a day or 2. This is normal and will go away on its own.    Postoperative Pain Management:   You should not have a lot of pain. The block you received at the time of surgery should keep you comfortable overnight.     You will not be given a prescription for pain medications when you leave the hospital. Use Tylenol or Ibuprofen (Advil, Aleve, Motrin, etc) as needed for pain.Good support and ice packs will help minimize the discomfort.    When to call:   Call if you have bleeding, severe pain,  swelling, redness around the wound, or fever.     Call if you have any questions.    Call to make an appointment to see me in the office in one week for a check of your wound and to discuss the results of your biopsy.

## 2021-01-24 NOTE — Anesthesia Procedure Notes (Signed)
---------------------------------------------------------------------------------------------------------------------------------------    AIRWAY   GENERAL INFORMATION AND STAFF    Patient location during procedure: OR       Date of Procedure: 01/24/2021 3:28 PM  CONDITION PRIOR TO MANIPULATION     Current Airway/Neck Condition:  Normal        For more airway physical exam details, see Anesthesia PreOp Evaluation  AIRWAY METHOD     Patient Position:  Sniffing    Preoxygenated: yes      Maintained In-Line Stability: not needed, normal c-spine condition          To see details of medications used, see MAR    Induction: IV, RSI and Succinylcholine    Mask Difficulty Assessment:  0 - not attempted      Technique Used for Successful ETT Placement:  Direct laryngoscopy    Devices/Methods Used in Placement:  Intubating stylet    Blade Type:  Macintosh    Laryngoscope Blade/Video laryngoscope Blade Size:  3    Cormack-Lehane Classification:  Grade I - full view of glottis    Placement Verified by: capnometry      Number of Attempts at Approach:  1  FINAL AIRWAY DETAILS    Final Airway Type:  Endotracheal airway    Adjunct Airway: soft bite block    Final Endotracheal Airway:  ETT      Cuffed: cuffed    Insertion Site:  Oral    ETT Size (mm):  7.0  ----------------------------------------------------------------------------------------------------------------------------------------

## 2021-01-24 NOTE — Anesthesia Procedure Notes (Signed)
----------------------------------------------------------------------------------------------------------------------------------------    Distal Branch Nerve Block      Date of Procedure: 01/24/2021 3:18 PM    Laterality:  Left    Injection Technique: Single-shot    Location Details:Pec 1()    Reason for Block: Post-op pain management and At Surgeon's request        Patient Location: OR  CONSENT AND TIMEOUT     Consent:  Obtained per policy    Timeout Documented by Nursing  METHOD     Patient Position:  Supine    Monitoring:  Blood pressure, Continuous pulse ox with supplemental oxygen and EKG    Sedation Used:  Yes    Level of Sedation: General Anesthesia              for meds injected see MAR portion of chart      Sterile Technique:  Hand sanitizing, hat and mask    Prep:  Aseptic technique per protocol and Chloraprep      Approach:  In-plane and Superior    Technique: Ultrasound guided       Attempts (Skin Punctures):  1   NEEDLE     Type:  Short-bevel     Gauge: 22 G     Length: 8 cm  BLOCK EVENTS      No blood aspirated     No intravascular injection  STAFF     Performed by  [name] Grates., MD  ----------------------------------------------------------------------------------------------------------------------------------------

## 2021-01-24 NOTE — Anesthesia Postprocedure Evaluation (Signed)
Anesthesia Post-Op Note    Patient: Carly Higgins    Procedure(s) Performed:  Procedure Summary  Date:  01/24/2021 Anesthesia Start: 01/24/2021  3:06 PM Anesthesia Stop: 01/24/2021  4:47 PM Room / Location:  [id number]_OR_[id number] / [place]   Procedure(s):  LEFT NEEDLE LOC PARTIAL MASTECTOMY     sentinel lymph node biopsy Diagnosis:  Malignant neoplasm of upper-outer quadrant of left breast in female, estrogen receptor negative [C50.412, Z17.1] Surgeon(s):  Minus Liberty, MD  Debara Pickett, Alvino Blood, MD Responsible Anesthesia Provider:  [name] Grates., MD         Recovery Vitals  BP: 148/85 (01/24/2021  5:30 PM)  Heart Rate: 75 (01/24/2021  5:30 PM)  Heart Rate (via Pulse Ox): 75 (01/24/2021  5:30 PM)  Resp: 18 (01/24/2021  5:30 PM)  Temp: 36.2 C (97.2 F) (01/24/2021  5:30 PM)  SpO2: 100 % (01/24/2021  5:30 PM)  O2 Flow Rate: 10 L/min (01/24/2021  4:48 PM)   0-10 Scale: 5 (01/24/2021  5:30 PM)    Anesthesia type:  general  Complications Noted During Procedure or in PACU:  None   Comment:    Patient Location:  PACU  Level of Consciousness:    Awake  Temperature Status:    Normothermic  Oxygen Saturation:    Within patient's normal range  Cardiac Status:   Within patient's normal range  Fluid Status:    Stable  Airway Patency:     Yes  Pulmonary Status:    Baseline  Pain Management:    Adequate analgesia and ongoing pain management needed  Nausea and Vomiting:    Controlled    Post Op Assessment:    Tolerated procedure well  Responsible Anesthesia Provider Attestation:  All indicated post anesthesia care provided       -

## 2021-01-24 NOTE — Preop H&P (Signed)
Breast Surgery Preoperative H&P Note    Patient: Carly Higgins Attending: Minus Liberty, MD    HPI: Carly Higgins is a 61 y.o. female who presents today for L partial mastectomy and sentinel lymph node biopsy. They report feeling well with no recent illnesses, new complaints and no recent changes to their medical history.    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       Allergies:   No Known Allergies (drug, envir, food or latex)    Family History:   family history includes Breast cancer in her paternal aunt; No Known Problems in her brother, mother, and sister; Throat cancer in her father.    Social History:   Social History     Tobacco Use    Smoking status: Every Day     Packs/day: 0.25     Years: 30.00     Pack years: 7.50     Types: Cigarettes    Smokeless tobacco: Never    Tobacco comments:     Quit for 2 years after BMT in 2017   Substance Use Topics    Alcohol use: Yes     Comment: occasional        Review of Systems: Pertinent positives as in HPI     Physical Examination:  Vitals:     Height: 165.1 cm (5' 5" ) Weight: 112.5 kg (248 lb)    GENERAL: alert, no acute distress  HEENT: normocephalic, atraumatic, face symmetric  HEART: RRR  LUNGS:  nonlabored respirations on RA. Lungs clear to auscultation   EXTREMITIES: atraumatic, wwp    Lab Results:  No results for input(s): WBC, HGB, HCT, PLT, APTT, INR, ESR, CRP in the last 72 hours.    No components found with this basename: PT No results for input(s): NA, K, CL, CO2, UN, CREAT, GLU, CA, MG, PO4 in the last 72 hours. No results for input(s): AST, ALT, ALK, TB, DB, TP, AMY, LIP, ALB, PALB in the last 72 hours.     Imaging:   Breast localization needle single LEFT    Result Date: 01/24/2021  Successful left breast needle-wire localization. I reviewed the plan for this procedure with Arizona Constable, MD prior to beginning the procedure and Arizona Constable, MD reviewed the images after the procedure was completed. [facility] submits this DICOM format image data and final report to the [facility], an independent secure electronic health information exchange, on a reciprocally searchable basis (with patient authorization) for a minimum of 12 months after exam date.  ASSESSMENT & RECOMMENDATIONS  ANGELIGUE Higgins is a 61 y.o. female with h/o myoproliferative disorder s/p PBSCT 2017 and L IDC who presents today for L partial mastectomy with SLNB. No apparent barriers to surgery today.     OR for L partial mastectomy with SLNB    Author: Esmond Plants, MD as of: 01/24/2021  at: 12:59 PM

## 2021-01-24 NOTE — Anesthesia Case Conclusion (Signed)
CASE CONCLUSION  Emergence  Actions:  Soft bite block, suctioned and extubated  Criteria Used for Airway Removal:  Adequate Tv & RR, acceptable O2 saturation, following commands and sustained tetany  Assessment:  Agitated and emergence delirium  Transport  Directly to: PACU  Airway:  Facemask  Oxygen Delivery:  10 lpm  Monitoring:  Pulse oximetry  Position:  Upright  Patient Condition on Handoff  Level of Consciousness:  Moderately sedated  Patient Condition:  Stable  Handoff Report to:  RN

## 2021-01-24 NOTE — Interval H&P Note (Signed)
UPDATES TO PATIENT'S CONDITION on the DAY OF SURGERY/PROCEDURE    I. Updates to Patient's Condition (to be completed by a provider privileged to complete a H&P, following reassessment of the patient by the provider):    Day of Surgery/Procedure Update:  History  History reviewed and no change    Physical  Physical exam updated and no change            II. Procedure Readiness   I have reviewed the patient's H&P and updated condition. By completing and signing this form, I attest that this patient is ready for surgery/procedure.    III. Attestation   I have reviewed the updated information regarding the patient's condition and it is appropriate to proceed with the planned surgery/procedure.    I have reminded Carly Higgins that COVID-19 is still present in our community. She was advised that [facility] and its affiliates have made deliberate and widespread changes to policies and procedures, consistent with applicable directives, in order to reduce the risk of exposure in our facilities.     I further explained and Carly Higgins understands that given the communicability of the SARS CoV2 coronavirus, there remains a small but real risk of contracting the disease while receiving perioperative care - even with stringent preventive measures in place.   Carly Higgins understands the potential consequences of COVID-19 disease as it relates to their planned procedure and anticipated postoperative course.      The patient and I have considered and discussed the relative risks and benefits of proceeding with his/her surgery - both in terms of the procedure itself, and also in the context of the ongoing pandemic.  Carly Higgins wishes to proceed with the procedure.     Carly Skorupski Ubaldo Glassing, MD as of 12:41 PM 01/24/2021

## 2021-01-24 NOTE — Anesthesia Procedure Notes (Signed)
----------------------------------------------------------------------------------------------------------------------------------------    Chest Wall Nerve Block      Date of Procedure: 01/24/2021 3:17 PM    Laterality:  Left    Injection Technique: Single-shot    Location Details: Pec 2()    Reason for Block: Post-op pain management and At Surgeon's request        Patient Location: OR  CONSENT AND TIMEOUT     Consent:  Obtained per policy    Timeout Documented by Nursing  METHOD     Patient Position:  Supine    Monitoring:  Blood pressure, Continuous pulse ox with supplemental oxygen and EKG    Sedation Used:  Yes    Level of Sedation: General Anesthesia              for meds injected see MAR portion of chart      Sterile Technique:  Hand sanitizing, hat and mask    Prep:  Aseptic technique per protocol and Chloraprep      Approach:  In-plane and Superior    Technique: Ultrasound guided       Attempts (Skin Punctures):  1   NEEDLE     Type:  Short-bevel     Gauge: 22 G     Length: 8 cm  BLOCK EVENTS      No blood aspirated     No intravascular injection  STAFF     Performed by  [name] Grates., MD  ----------------------------------------------------------------------------------------------------------------------------------------

## 2021-01-25 ENCOUNTER — Telehealth: Payer: Self-pay | Admitting: Radiation Oncology

## 2021-01-25 ENCOUNTER — Encounter: Payer: Self-pay | Admitting: Surgical Oncology

## 2021-01-25 NOTE — Telephone Encounter (Signed)
Phone call to patient.  She just had lumpectomy yesterday.  She wants to "get away" as it has been a long 8 months.  States she asked Dr Bennie Hind how long she can wait after surgery until she starts radiation treatment and was told about 3 months.  Is scheduled for post op appointment on 02/06/21 and then will be leaving for [place] the next day.  Would like to wait until mid May for radiation treatment.  Told her this will be discussed with Dr Rudi Rummage and we will get back to her.  Patient voiced understanding and agreement with plan.

## 2021-01-25 NOTE — Op Note (Signed)
Operation performed for curative intent? Yes.    Anesthesia: General anesthesia with LMA.  Block done: Yes. Block Type: PECS1, PECS2 and Serratus.  Done by anesthesiologist.  Preemptive local analgesia (Local anesthesia given BEFORE incision): No.  Local anesthesia used (given AFTER incision): No.    Estimated Blood Loss: 25 cc.     Complications: No.    Unusual Findings?: No.    Side of Lesion: Left   Left  Location of lesion(s) in breast:  Multiple lesions: No. Lesion  Quadrant: Upper Outer Quadrant  Clock: 130   Cm From Nipple: 7.  Histology: Malignant:   Invasive: Yes.  Type: Ductal Cancer Staging  IDC left 2 o'clock 2.5 cm, ER/PR- her 2-  Staging form: Breast, AJCC 8th Edition  - Clinical stage from 06/15/2020: Stage IIB (cT2, cN0, cM0, G3, ER-, PR-, HER2-) - Signed by Minus Liberty, MD on 06/15/2020.  Neoadjuvant therapy given: Yes: Chemotherapy: Yes, Hormone therapy: No.  Patient a candidate for breast conservation: Yes.  Breast Conserving Surgery Performed: Yes     Partial Mastectomy:  Palpable mass: No.  Re-excision: No  Core biopsy done prior to surgery: Yes.  Localization done: Yes.   Mammographic.  Wire(s): Number placed: 1. Bracketing: No.  Ultrasound done in operating room to localize lesion: No.  Location of incision: UOQ  Orientation of Incision: Curvilinear  Oncoplastic procedure: None planned  Skin Excised: No  Taken down to pectoralis: No  Nipple or NAC excised: No.  Specimen oriented: Yes.  Wide excision done (attempt to achieve surgical margins): Yes.  Specimen mammogram done: Yes.   Clip captured: Yes.  Lesion captured: N/A.    Intraoperative gross assessment done: No  Extra margins taken: No.  Clips placed in cavity: Yes  Drain placed: No.  Deep space closed: No  Wound closure:   Deep SQ/dermal: 3-0 Vicryl simple interrupted .   Skin: 4-0 monocryl running subcuticular .  Marland Kitchen  Nodal staging indicated: Yes. Nodal staging done: Yes.     Nodal Staging Procedure:  Patient candidate for SLNB?: Yes.  Sentinel Node Dissection: Yes.     Sentinel node biopsy:   Localization method:   Nuclear Medicine:  Same day and Tc99 Timanocept (Lymphoseek)   and   Dye Injected:  Location of injection: Periareolar  Type of dye: Methylene Blue Dye    Incision: Axillary.  Sentinel nodes identified: Yes. Number removed (grossly): 3 Blue/green nodes include any colored node or an node (colored or not) seen at the end of a dye-filled (colored) lymphatic.  Location Hot Hot and Blue/Green Blue/Green Palpable   Level 1  3                                                                                 Counts of Hottest node: 266  Background counts: <10  Frozen Section done: No.  Were all hot nodes removed: Yes  Were all palpable suspicious nodes removed: N/A  Were all blue/green nodes removed: Yes  Were lymphatics re approximated: No.    Completion axillary dissection done: No. not indicated as yet     Axillary reverse mapping done: No  Drain placed: No.  Deep space closed: No  Wound closure:   Deep SQ/dermal: 3-0 Vicryl simple interrupted .   Skin: 4-0 monocryl running subcuticular .  Marland Kitchen    Operative Note (Surgical Case/Log ID: [id number])       Date of Surgery: 01/24/2021       Surgeons: Surgeon(s) and Role:     * Minus Liberty, MD - Primary     * Hilty, Alvino Blood, MD - Resident - Assisting   Assistants:         Pre-op Diagnosis: Pre-Op Diagnosis Codes:     * Malignant neoplasm of upper-outer quadrant of left breast in female, estrogen receptor negative [C50.412, Z17.1]       Post-op Diagnosis: Post-Op Diagnosis Codes:     * Malignant neoplasm of upper-outer quadrant of left breast in female, estrogen receptor negative [C50.412, Z17.1]       Procedure(s) Performed: Procedure(s) (LRB):  LEFT NEEDLE LOC PARTIAL MASTECTOMY    (Left)  sentinel lymph node biopsy (Left)       Anesthesia Type: General        Fluid Totals: I/O this shift:  01/18 0600 - 01/18 2259  In: 2000 (17.9 mL/kg) [P.O.:300; I.V.:1700]  Out: 25 (0.2 mL/kg)  [Blood:25]  Net: 1975  Weight: 111.6 kg        Estimated Blood Loss: 25 mL       Specimens to Pathology:  ID Type Source Tests Collected by Time Destination   A : left breast needle loc partial mastectomy short superior long lateral double deep, status post  neoadjuvant chemotherapy  TISSUE Tissue SURGICAL PATHOLOGY Jaynee Eagles, RN 01/24/2021 1544    B : Axillary sentinel lymph node count 163 TISSUE Lymph node SURGICAL PATHOLOGY Jaynee Eagles, RN 01/24/2021 1600           Temporary Implants:        Packing:                 Patient Condition: good       Indications: Left breast cancer       Findings (Including unexpected complications):      Description of Procedure: The patient was brought to the operating room and placed in the supine position with the arms extended. After induction of anesthesia and placement of blocks, the wire was trimmed and the neoprobe was used to confirm axillary isotope signal. Methylene blue injected in the subareolar plexus.The breast and axilla were prepped and draped in sterile fashion. The axillary incision was created and extended down to the fascia. That was opened to expose the axillary contents. Three nodes were identified harboring isotope with blue color and/or blue lymphatics leading to them. They were dissected free and sent for permanent section. No other hot or blue nodes could be identified. There were none abnormal to palpation. Background counts were very low. Hemostasis was assured and the axilla irrigated. The wound was closed as above.     Attention was turned to the breast. The incision was created as above. Skin flaps were raised and the wire was brought to lie in the wound. The tissue surrounding the wire was divided to free the specimen. It was oriented and radiographed. The clip was well contained in the specimen. Hemostasis was assured, the cavity irrigated and clips placed. The wound was closed as above. Both incisions were cleansed and dermabond  applied. Counts were correct x2. The patient tolerated the procedure well.     I, the attending surgeon, was present and scrubbed in through all  critical portions of the procedure.         Signed:  Tristina Sahagian Ubaldo Glassing, MD  on 01/25/2021 at 10:09 AM

## 2021-01-25 NOTE — Telephone Encounter (Signed)
[facility] would like to speak with Dr. Rudi Rummage in regards to her radiation treatment. Please call.

## 2021-01-26 NOTE — Telephone Encounter (Signed)
Call to patient after hearing from Dr Rudi Rummage.  Told patient that Dr Rudi Rummage prefers to start treatment within 8 weeks of surgery.   We counted out the weeks and she is in agreement with coming back from [place] on 03/24/21 at 2:30 for update and simulation.  Writer gave her a little leeway, because per Dr Rudi Rummage, radiation treatment must start date is a a bit longer than that.  Patient will look into getting radiation treatment in [place], but she wanted to make an appointment to come back for the appointment with Dr Rudi Rummage in the event she cannot get treatment in [place].  We are both in agreement and she voiced understanding.

## 2021-02-01 ENCOUNTER — Ambulatory Visit: Payer: Medicare Other | Attending: Oncology | Admitting: Oncology

## 2021-02-01 DIAGNOSIS — Z171 Estrogen receptor negative status [ER-]: Secondary | ICD-10-CM | POA: Insufficient documentation

## 2021-02-01 DIAGNOSIS — F17218 Nicotine dependence, cigarettes, with other nicotine-induced disorders: Secondary | ICD-10-CM | POA: Insufficient documentation

## 2021-02-01 DIAGNOSIS — C50412 Malignant neoplasm of upper-outer quadrant of left female breast: Secondary | ICD-10-CM | POA: Insufficient documentation

## 2021-02-01 NOTE — Progress Notes (Signed)
Tobacco Dependence Treatment Program    Telephone Visit     02/01/2021     Location of Patient: home    Location of Telemedicine Provider: home / other    Other participants in telemedicine encounter and roles:  SHERELL CHRISTOFFEL     This is an established patient visit.    Reason for visit: Nicotine Dependence    Carly Higgins is now s/p partial mastectomy 01/24/2021.  She states that surgery went well and her recovery has been going great.  She is planning to leave for [place] in the coming weeks.  Hoping to establish care for radiation treatments while she is in [place] if she is able to, having a hard time getting a return call from the centers she has reached out to thus far.    She notes that after surgery she was able to be smoke free for 3-4 days using the nicotine gum as needed.  She is currently smoking minimally, half cigarette or less at a time when she does smoke.  She states that "I'm not there yet, but I am getting there".  She notes that first thing in the morning is a big trigger with her coffee, she also feels bored following surgery.  She cannot go out and walk because of the cold.  Hoping that she will be able to stay busy when she is in [place].    [facility] Tobacco Dependence Treatment Program Summary:   Visit# Date Current Use/Treatment   1 01/11/2021 Ms. Nier is a 61 y.o. female with a history of MPN s/p PBSCT (2017) and left breast cancer which was diagnosed in 2022. She is scheduled for partial mastectomy on 01/24/2021 and plan is to start radiation treatments 4-6 weeks after surgery.  Historically, Lexany has been a smoker since the age of 72 and she has generally been <10 cigarettes/day smoker.  She did trial the Vuse vape but dnot didn't like it, made her cough/choke.  Of note, she quit smoking when she was admitted for PBSCT and remain abstinent for 2 years.  More recently, she has gone up to 5 days without smoking at all (cold [place], no medications) in the past month or so.  She has been experiencing  increasing hip/groin pain and may require a hip replacement this year, she would like to quit for his reason as well.   2 02/01/2021 Smoking minimally, a few partial cigarettes per day.  She has found the nicotine gum to be helpful to curb cravings.          Patient's problem list, allergies, and medications were reviewed and updated as appropriate. Please see the EHR for full details.    Detailed Tobacco History:   Current Tobacco Use  Social History     Tobacco Use   Smoking Status Every Day    Packs/day: 0.25    Years: 30.00    Pack years: 7.50    Types: Cigarettes   Smokeless Tobacco Never   Tobacco Comments    Quit for 2 years after BMT in 2017     Initial Visit Tobacco History:  Current use History of  Type(s) of Tobacco   [x]  []  Combustible Cigarettes   []  []  Cigars, Cigarillos and little cigars   []  []  Smokeless Tobacco (chewing tobacco, snuf, snus, dip, or pouches)   []  [x]  Electronic Nicotine Delivery Systems (ENDS; ecigarettes, electronic cigarettes, vapes, mods)   []  []  Other (specify):     Ms. Lyday reports smoking since she was  61 years old and current usage is as follows: 6-7 cigarettes per day.  The most she has ever utilized is 10-15 cigarettes per day.  Her first use of tobacco after waking is 10 minutes.    Longest quit:  quit for 2 years after BMT.  Most recent quit: 5 days within the past month.    Date She rates her willingness to stop using tobacco  She rates her ability to stop using tobacco   01/11/2021 7 out of 10 8 out of 10          Tobacco Dependency Treatment History:   -Nicotine Patch:  Has used.  Reports the following: no relief from withdrawal symptoms and no side effects.  -Nicotine Gum:  Currently Prescribed.  -Nicotine Lozenge:  Has not used.  -Nicotine Inhaler:  Has not used.   -Nicotine Nasal Spray:  Has not used.  -Bupropion:  Has not used.  -Chantix: Has used.  Reports the following: good relief of symptoms.   -Cold [place]- with success at time of her BMT  -The following  are contraindicated for her: N/A    Social History:   Employment status: Disabled since BMT.  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  Living situation: alone with her dog.  Other Smokers in the home:  No  .  Children: none.  Caffeine: coffee 1 /day.  Alcohol use: Rarely  Other drug use: when she was younger     Diagnostics/Testing:         Lab results: 01/04/21  0900   Sodium 140   Potassium 4.3   Chloride 102   CO2 23   UN 14   Creatinine 0.62   Glucose 166*   Calcium 9.4     Assessment/Plan:   MPN s/p PBSCT/Breast Cancer/Tobacco Dependence:  Kalleigh has found the nicotine gum to be helpful and was able to be smoke free for 3-4 days after surgery.  She is currently smoking minimally, a few partial cigarettes per day.    Because she will be starting radiation treatments in the coming months, we discussed the benefits of quitting tobacco specifically related to cancer treatment:  Quitting smoking can make chemotherapy and radiation more effective, improve surgical wound healing, and reduce risk of infection.  Additionally quitting can reduce risk of cancer recurrence and decrease chances of developing a second primary cancer.     Encouraged regular use of the nicotine gum.  Reviewed that the danger of smoking is in the thousands of chemicals in cigarette smoke.  Nicotine replacement therapy is proven to be a safe, effective way for patients to quit and does not pose the dangers that smoking does.  She verbalized understanding.     If she does not feel that the gum is adequate and she is having more withdrawal symptoms, may consider low dose nicotine patch in the future.  Because she is struggling with her first coffee in the morning we discussed changing brands of coffee or using a different drink in the morning (tea, water, juice) to help reduce cravings that are often triggered by coffee.    Clinic follow up was requested in 3-4 week(s) and she will call my office in the meantime with any questions or concerns.      The plan was discussed with the patient and the patient/patient rep demonstrated understanding to the provider's satisfaction.    Consent was previously obtained from the patient to complete this telephone consult; including the potential for financial liability.  5-10 minutes was spent on the phone with the patient, patient representatives, and/or other attendees.     Thank you for allowing me to participate in the care of your patients.  If I can be of any assistance please contact me.    Isac Caddy, DNP, ANP-BC, CTTS, NCTTP  Program Director Tobacco Dependence Treatment Program  Nurse Practitioner/Certified Tobacco Treatment Specialist  [employer]  [phone]

## 2021-02-03 ENCOUNTER — Telehealth: Payer: Self-pay | Admitting: Hematology and Oncology

## 2021-02-03 NOTE — Telephone Encounter (Signed)
Calling to see if 1/30 blood work and FUV with michelle can be a little bit later.

## 2021-02-03 NOTE — Telephone Encounter (Signed)
Spoke with patient to inform her we couldn't move up her appointment with Ival Bible, but we did move the patient's port draw from 8:30 to 9:30 am. Patient expressed concern about appointments being back to back and I let her know that the teams are aware and there is no problem. Patient verbalized understanding and showed appreciation.

## 2021-02-06 ENCOUNTER — Other Ambulatory Visit: Payer: Self-pay

## 2021-02-06 ENCOUNTER — Ambulatory Visit: Payer: Medicare Other | Admitting: Surgical Oncology

## 2021-02-06 ENCOUNTER — Encounter: Payer: Self-pay | Admitting: Surgical Oncology

## 2021-02-06 ENCOUNTER — Ambulatory Visit: Payer: Medicare Other | Admitting: Oncology

## 2021-02-06 ENCOUNTER — Telehealth: Payer: Self-pay | Admitting: Surgical Oncology

## 2021-02-06 ENCOUNTER — Ambulatory Visit: Payer: Medicare Other

## 2021-02-06 VITALS — BP 126/76 | HR 85 | Temp 95.5°F

## 2021-02-06 VITALS — BP 126/76 | HR 85 | Temp 95.5°F | Wt 246.5 lb

## 2021-02-06 DIAGNOSIS — C50412 Malignant neoplasm of upper-outer quadrant of left female breast: Secondary | ICD-10-CM | POA: Insufficient documentation

## 2021-02-06 DIAGNOSIS — D849 Immunodeficiency, unspecified: Secondary | ICD-10-CM

## 2021-02-06 DIAGNOSIS — F17218 Nicotine dependence, cigarettes, with other nicotine-induced disorders: Secondary | ICD-10-CM

## 2021-02-06 DIAGNOSIS — D471 Chronic myeloproliferative disease: Secondary | ICD-10-CM

## 2021-02-06 DIAGNOSIS — Z9481 Bone marrow transplant status: Secondary | ICD-10-CM

## 2021-02-06 DIAGNOSIS — R5383 Other fatigue: Secondary | ICD-10-CM

## 2021-02-06 DIAGNOSIS — G629 Polyneuropathy, unspecified: Secondary | ICD-10-CM

## 2021-02-06 DIAGNOSIS — Z452 Encounter for adjustment and management of vascular access device: Secondary | ICD-10-CM | POA: Insufficient documentation

## 2021-02-06 DIAGNOSIS — Z171 Estrogen receptor negative status [ER-]: Secondary | ICD-10-CM | POA: Insufficient documentation

## 2021-02-06 DIAGNOSIS — T451X5A Adverse effect of antineoplastic and immunosuppressive drugs, initial encounter: Secondary | ICD-10-CM

## 2021-02-06 DIAGNOSIS — L608 Other nail disorders: Secondary | ICD-10-CM

## 2021-02-06 DIAGNOSIS — F17210 Nicotine dependence, cigarettes, uncomplicated: Secondary | ICD-10-CM | POA: Insufficient documentation

## 2021-02-06 DIAGNOSIS — M25552 Pain in left hip: Secondary | ICD-10-CM

## 2021-02-06 LAB — COMPREHENSIVE METABOLIC PANEL
ALT: 32 U/L (ref 0–35)
AST: 26 U/L (ref 0–35)
Albumin: 4.4 g/dL (ref 3.5–5.2)
Alk Phos: 98 U/L (ref 35–105)
Anion Gap: 15 (ref 7–16)
Bilirubin,Total: 0.5 mg/dL (ref 0.0–1.2)
CO2: 24 mmol/L (ref 20–28)
Calcium: 9.6 mg/dL (ref 8.6–10.2)
Chloride: 101 mmol/L (ref 96–108)
Creatinine: 0.67 mg/dL (ref 0.51–0.95)
Glucose: 105 mg/dL — ABNORMAL HIGH (ref 60–99)
Lab: 15 mg/dL (ref 6–20)
Potassium: 4.6 mmol/L (ref 3.3–5.1)
Sodium: 140 mmol/L (ref 133–145)
Total Protein: 6.3 g/dL (ref 6.3–7.7)
eGFR BY CREAT: 100 *

## 2021-02-06 LAB — CBC AND DIFFERENTIAL
Baso # K/uL: 0.1 10*3/uL (ref 0.0–0.1)
Basophil %: 0.9 %
Eos # K/uL: 0.2 10*3/uL (ref 0.0–0.4)
Eosinophil %: 3.5 %
Hematocrit: 35 % (ref 34–45)
Hemoglobin: 10.6 g/dL — ABNORMAL LOW (ref 11.2–15.7)
Lymph # K/uL: 1.5 10*3/uL (ref 1.2–3.7)
Lymphocyte %: 26.1 %
MCH: 30 pg (ref 26–32)
MCHC: 31 g/dL — ABNORMAL LOW (ref 32–36)
MCV: 98 fL — ABNORMAL HIGH (ref 79–95)
Mono # K/uL: 0.5 10*3/uL (ref 0.2–0.9)
Monocyte %: 8.5 %
Neut # K/uL: 3.5 10*3/uL (ref 1.6–6.1)
Platelets: 265 10*3/uL (ref 160–370)
RBC: 3.6 MIL/uL — ABNORMAL LOW (ref 3.9–5.2)
RDW: 16.4 % — ABNORMAL HIGH (ref 11.7–14.4)
Seg Neut %: 61 %
WBC: 5.7 10*3/uL (ref 4.0–10.0)

## 2021-02-06 LAB — SURGICAL PATHOLOGY

## 2021-02-06 LAB — LACTATE DEHYDROGENASE: LD: 155 U/L (ref 118–225)

## 2021-02-06 LAB — NEUTROPHIL #-INSTRUMENT: Neutrophil #-Instrument: 3.5 10*3/uL

## 2021-02-06 MED ORDER — SODIUM CHLORIDE 0.9 % INJ (FLUSH) WRAPPED *I*
10.0000 mL | Status: DC | PRN
Start: 2021-02-06 — End: 2021-02-06
  Administered 2021-02-06: 10 mL

## 2021-02-06 MED ORDER — HEPARIN LOCK FLUSH 10 UNIT/ML IJ SOLN WRAPPED *I*
50.0000 [IU] | INTRAVENOUS | Status: DC | PRN
Start: 2021-02-06 — End: 2021-02-06
  Administered 2021-02-06: 50 [IU]

## 2021-02-06 NOTE — Progress Notes (Signed)
Patient presents for port draw. Port accessed without difficulty. Flushes easily and blood return noted. Labs drawn per orders. Port deaccessed without issues and flushed with Heparin per policy. Gauze dressing placed over site. Discharged in stable condition.     Patient complied with masking policy throughout duration of appointment. Writer maintained use of mask throughout duration of appointment. Writer was within 6 feet of patient for > 15 minutes due to the nature of the treatment.

## 2021-02-06 NOTE — Patient Instructions (Signed)
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     Left partial mastectomy (Dr. Freda Munro) performed January 24, 2021.  She had post surgical follow up just prior to our visit today (pathology still pending).      Radiation therapy to begin 4-6 weeks after surgery; as discussed in office visit note she is hoping for this to be administered in [place].     Typically we would like to see her back in about 12 weeks.  Since she is hoping to be in [place] until late May, we will arrange follow up here for first week in June.  She will notify is if her plans change.

## 2021-02-06 NOTE — Telephone Encounter (Signed)
Called pt to let her know that pathology had just come back and Dr. Freda Munro reports everything is good. Will have referral put in for rad onc in [place].

## 2021-02-06 NOTE — Telephone Encounter (Signed)
Patient returning call she received, no other detail provided. Please contact

## 2021-02-06 NOTE — Progress Notes (Signed)
Comprehensive [facility] at [facility]      Subjective:      Carly Higgins presents at [place] at [facility] after lumpectomy and sentinel node biopsy. No concerns about the wound.    Objective:     Vitals: BP 126/76    Pulse 85    Temp 35.3 C (95.5 F) (Temporal)    Wt 111.8 kg (246 lb 7.6 oz)    SpO2 96%    BMI 41.02 kg/m     General:  alert, appears stated age and cooperative   Axillary Incision: healing well   Breast Incision: healing well       Pathology: still pending     Assessment and Plan:     No evidence of complications. Will call with pathology report. Would like radiation in [place]. Will forward reports and try to facilitate scheduling. Follow up here in 6 months.

## 2021-02-06 NOTE — Progress Notes (Signed)
[facility]     FOLLOW-UP VISIT    PATIENT NAME:  Carly Higgins, Carly Higgins    DOB:  [birth year]   MRN:  [id number]  02/06/2021     DIAGNOSIS:  Clinical stage IIB left breast cancer    REASON FOR VISIT: Jakiera Ehler is seen today for follow up and ongoing recommendations for management of stage IIB (cT2, cN0, cM0) grade 3, ER negative, PR negative, HER2 negative infiltrating ductal carcinoma of the left breast.       She underwent left partial mastectomy and SLNB on January 24, 2021.  Just prior she received doxorubicin and cyclophosphamide (cycle 4 on December 08, 2020) following 4 cycles of weekly paclitaxel and carboplatin as neoadjuvant chemotherapy.      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    In February 2022 she began to experience discomfort in the left breast.  She first attributed this to having been struck with a part of her dog when she picked it up about 1 week earlier.  She continued to experience discomfort intermittently. She scheduled a screening mammogram, having not had an exam since July 2018.         MPN (myeloproliferative neoplasm)   01/19/2014 Initial Diagnosis    MPN (myeloproliferative neoplasm)     07/15/2020 -  Chemotherapy     Began cycle 1 of weekly paclitaxel and carboplatin on July 15, 2020.  Immunotherapy with pembrolizumab was not recommended due to her previous allogenic bone marrow transplant.   Final cycle (4) of paclitaxel and carboplatin was administered on September 30, 2020.   Began dose dense cyclophosphamide and doxorubicin on October 06, 2020    OP Breast CARBOplatin Weekly Paclitaxel then [facility]   Plan Provider: Beverely Pace, MD  Treatment goal: Neoadjuvant  Line of treatment: [No plan line of treatment]     IDC left 2 o'clock 2.5 cm, ER/PR- her 2-   05/18/2020 Significant Radiology Findings    Screening [place]:  scattered tissue, architectural distortion left 2 o'clock. BIRAD 0       06/07/2020 Significant Radiology Findings    Ultrasound [place]: irregular hypoechoic mass left 2 o'clock, 19x19x12 mm, 25 mm radial. middle third upper outer quadrant of the left breast. BIRAD 4  Ultrasound guided core IDC     06/09/2020 Procedure    ultrasound-guided core biopsy of the left breast 2:00 lesion:   Biopsy revealed invasive ductal carcinoma.  Provisional combined Nottingham grade was 3 of 3 (tubule formation score 3, nuclear grade 3, mitotic rate score 2).  Tumor cells were negative for estrogen receptors and progesterone receptors.  Tumor cells were negative for HER2 by immunohistochemical staining with a staining score of 1+.  Ki-67 staining was seen in approximately 70% of tumor cells.        06/13/2020 Consultation    She was seen in the Hereditary Oncology clinic :  Because of her prior allogeneic bone marrow transplant, she was not eligible for standard germline testing with blood or saliva specimen.       06/15/2020 Initial Diagnosis    IDC left 2 o'clock 2.5 cm, ER/PR- her 2-     06/15/2020 Cancer Staged    Staging form: Breast, AJCC 8th Edition  - Clinical stage from 06/15/2020: Stage IIB (cT2, cN0, cM0, G3, ER-, PR-, HER2-)     06/15/2020 Consultation  Surgical consultation:  Dr. Freda Munro      06/20/2020 Significant Radiology Findings    Whole-body bone scan : No abnormal focus of radiotracer uptake to suggest osseous metastatic disease.     06/22/2020 Significant Radiology Findings    CT of the chest: mill-defined enhancing lesion in the lateral left breast measuring 1.7 x 1.2 x 1.1 cm.  There were asymmetrically prominent level 1 left axillary lymph nodes, enhancement of uncertain significance, possibly representing metastatic disease. No enlarged mediastinal, hilar, intramammary,  supraclavicular lymph nodes or suspicious pulmonary nodules were identified.       06/22/2020 Significant Radiology Findings    CT of the  abdomen pelvis : no evidence of metastatic disease . small fat filled umbilical hernia noted.     06/28/2020 Significant Radiology Findings    MRI of the breast: irregular mass with spiculated margins measuring 10 x 25 x 19 mm in the left breast upper outer quadrant at the 2 o'clock position located 3 cm from the nipple.      07/15/2020 -  Chemotherapy     Began cycle 1 of weekly paclitaxel and carboplatin on July 15, 2020.  Immunotherapy with pembrolizumab was not recommended due to her previous allogenic bone marrow transplant.   Final cycle (4) of paclitaxel and carboplatin was administered on September 30, 2020.   Began dose dense cyclophosphamide and doxorubicin on October 06, 2020    OP Breast CARBOplatin Weekly Paclitaxel then [facility]   Plan Provider: Beverely Pace, MD  Treatment goal: Neoadjuvant  Line of treatment: [No plan line of treatment]     08/18/2020 Procedure    PowerPort in the right internal jugular vein.     12/21/2020 Significant Radiology Findings    Left breast mammogram and ultrasound on December 21, 2020:  Impression:  Mass in the left breast upper outer quadrant at 2 o'clock located 8 centimeters from the nipple is a known malignancy. Surgical consultation is recommended. Patient completed chemotherapy treatment on 12/08/2020. Patient is scheduled for left lumpectomy on 01/24/2021.     Finding 2:  Normal lymph node and biopsy clip in the left axilla is benign.     Finding 3:  Stable calcifications in the left breast are benign.        01/04/2021 Significant Radiology Findings    MRI breasts on December 15, 2020:  Previously seen irregular spiculated enhancing mass in the left breast does not demonstrate significant residual enhancement. A biopsy clip is noted along the medial margin of the previously seen mass.  There is no axillary or internal mammary lymphadenopathy. In the right breast, there is no evidence of any mass or suspicious enhancements.  Impression:  Area in the left breast is a  known malignancy. No significant residual enhancement in the area of previously seen mass, consistent with excellent/complete radiologic response.            INTERVAL HISTORY:  Maguire is here today with mother.  She was last seen in our office on January 04, 2021 for assessment following completion of neoadjuvant chemotherapy.  Since that time:    She met with tobacco cessation team on January 11, 2021.  She is using nicorette gum and hard candy to curb cravings.  She has cut back on cigarettes/day and did not smoke at all for 4 days following her surgery.  She will continue to meet with tobacco cessation team monthly.    She underwent left partial mastectomy and SLNB on January 24, 2021. Her post  op visit with Dr. Freda Munro was just prior to our visit.  She is disappointed that the pathology report is not back yet otherwise she denies any concerns related to her surgery/recovery.   She feels she is healing well.  Implanted central venous access port is still in place.    She and her mother will be leaving for [place] (driving) tomorrow morning.  She is hoping to be there until late May. It is recommended that she have left breast radiation therapy beginning in March.  She is hoping it can be arranged for her to have her radiation while in [place].  She reports that the surgical team is placing a referral.  In case this does not work out, she has a tentative appointment with radiation oncology here on March 24, 2021.    Otherwise, her appetite is good. She has lost about 20 pounds since August.  She feels some of this is related to sensation of fullness and believes this is due to her diabetes medication MOUNJARO.  She is happy to report that her HgbA1c has dropped.  She reports ongoing daily morning nausea for which ondansetron has been helpful.  She has experienced this since her BMT.  She has also noticed increased acid reflux recently, which may be contributing.  She has not vomited.  She denies diarrhea or  constipation.  Her energy level is slowly improving.  She has not had any recent fevers or symptoms of infection.  She denies respiratory symptoms.  She denies exertional chest pain or palpitations.  She is not experiencing any urinary symptoms.  She continues to have chronic left hip pain.  This has been present > 1 year.  She noticed some improvement for a few days after steroids given with chemotherapy.  Pain is worst when ambulating (sometimes 8/10) but recently pain has been better/decreased; she has not used ibuprofen recently. Last cortisone injection was summer 2022 . Planning for hip replacement surgery once recovered from breast cancer treatments.       Prior to beginning chemotherapy she had baseline numbness in her first toe bilaterally.  Neuropathy symptoms worsened in her feet with paclitaxel.  Today she reports that she is still experiencing prickly discomfort and tenderness in her toes.  She continues to take gabapentin at bedtime with some relief; she will discuss increased dose with her PCP.  Tried acupuncture X 3 did not feel it was helpful.  Had a foot massage once which was calming but did not help with neuropathy.  Gait remains unsteady but more due to left hip pain.  Toenails remain sensitive but improving.  Fingernails have lifted some at the tips but are intact.  Hair on her scalp has started to regrow.    ROS: As outlined above: comprehensive review of systems is otherwise negative.     Immunization History  She reports that she received annual flu vaccine on October 25, 2020.     Administered Date(s) Administered    COVID-19 MRNA Bivalent Vaccine Booster Pfizer-Biontech 30 mcg/0.34m IM SUSP 10/25/2020    Covid-19 mRNA vaccine (PFIZER) IM 30 mcg/0.381m05/07/2019, 06/05/2019, 10/08/2019    DTaP(Infanrix) 08/01/2016, 09/25/2016, 11/22/2016    Hepatitis B Adult 08/01/2016, 09/25/2016, 02/21/2017, 08/15/2017    HiB(Acthib/Hiberix) 08/01/2016, 09/25/2016, 11/22/2016    IPV 08/01/2016,  09/25/2016, 11/22/2016    Meningococcal Conjugate MCV4O (Menveo) 08/01/2016    Pneumococcal 13-valent Conjugate (Prevnar 13) 08/01/2016, 09/25/2016, 11/22/2016    Pneumococcal Polysaccharide (Pneumovax) 02/21/2017, 08/15/2017    Zoster(Shingrix) 02/21/2017, 08/15/2017  PAST MEDICAL/SURGICAL HISTORY:   1. 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  Prior to bone marrow transplant she received 5 cycles of decitabine.  She received fludarabine, busulfan and and hydroxyurea for the transplant and received methotrexate for graft-versus-host prophylaxis.  2. Hypothyroid.  3. Anxiety.  4. Osteoarthritis of the hips, left greater than right.  5. Mild diverticulosis of the sigmoid colon.  6. History of atrial fibrillation and congestive heart failure in 2017 related to the bone marrow transplant.  7. Right cataract surgery.  8. Right rotator cuff surgery.    MEDICATIONS:     Current Outpatient Medications   Medication Sig    aspirin 81 mg EC tablet Take 1 tablet (81 mg total) by mouth daily    ibuprofen (ADVIL,MOTRIN) 600 mg tablet Take 1 tablet (600 mg total) by mouth 4 times daily as needed for Pain    nicotine polacrilex (NICORETTE) 2 mg gum Take 1 each (2 mg total) by mouth every 2 hours as needed for Smoking cessation  Max daily dose: 24 mg    atorvastatin (LIPITOR) 40 mg tablet SMARTSIG:1 Tablet(s) By Mouth Every Evening    ondansetron (ZOFRAN) 4 mg tablet Take 1 tablet (4 mg total) by mouth 3 times daily as needed (nausea)    MOUNJARO 5 MG/0.5ML pen SMARTSIG:5 Milligram(s) SUB-Q Once a Week    GABAPENTIN 300 MG capsule Take 300 mg by mouth daily    acyclovir (ZOVIRAX) 400 mg tablet Take 1 tablet (400 mg total) by mouth 2 times daily    metoprolol tartrate (LOPRESSOR) 50 mg tablet Take 1 tablet (50 mg total) by mouth 2 times daily    cholecalciferol (VITAMIN D) 50 MCG (2000 UT)  tablet Take 1,000 Units by mouth daily    levothyroxine (SYNTHROID, LEVOTHROID) 50 MCG tablet Take 50 mcg by mouth daily (before breakfast)    escitalopram (LEXAPRO) 20 MG tablet Take 20 mg by mouth daily    prochlorperazine (COMPAZINE) 10 mg tablet Take 1 tablet (10 mg total) by mouth every 6 hours as needed (nausea) (Patient not taking: No sig reported)    acetaminophen (TYLENOL) 500 mg tablet Take 2 tablets (1,000 mg total) by mouth 3 times daily as needed for Pain (Patient not taking: No sig reported)     OBJECTIVE FINDINGS:  Vitals:    02/06/21 1000   BP: 126/76   Pulse: 85   Temp: 35.3 C (95.5 F)   TempSrc: Temporal   SpO2: 96%; room air   Weight: 111.8 kg (246 lb 7.6 oz); down 1.8 kg since last visit  Down ~ 9 kg since August 2022        Pain    02/06/21 1000   PainSc:   7   PainLoc: Hip     PHYSICAL EXAMINATION:     General: Reveals a well-appearing woman; pleasant and engaged. ECOG performance status is 1.    HEENT : grossly unremarkable.  Sclerae are anicteric. Conjunctivae are pink.  Oral mucosa is not examined today as she has no concerns and is wearing mask due to pandemic.   Lymph nodes: There is no palpable cervical, supraclavicular, axillary or inguinal lymph node enlargement.   Chest: clear to auscultation and percussion.  PowerPort is present in the upper chest on the right no erythema, or edema  Cardiovacular: RRR. No murmur    Breasts: left breast lumpectomy site appears to be healing well;  intact (adhesive still present) with no erythema or drainage.  Left axillary incision well healed.  There is a firmness just beneath this incision consistent with small seroma or swelling.  There is no erythema or drainage.  Abdomen: is soft,non tender  Bowel sounds are +  All quadrants.no hepatosplenomegaly, mass or ascites detected .   Extremities: are without edema.   Neurologic exam: is grossly nonfocal.   Skin: is dry  without rash, petechiae or ecchymoses. patches of vitiligo in the upper abdomen.  Mild lifting on distal fingernails; all intact.    LABORATORY DATA:     Lab results: 02/06/21  0931 01/04/21  0900 12/08/20  1242 11/24/20  0941   WBC 5.7 5.6 5.8 0.6*   Hemoglobin 10.6* 9.4* 8.7* 8.7*   Hematocrit 35 31* 28* 28*   RBC 3.6* 3.0* 2.7* 2.7*   Platelets 265 271 320 145*   Neut # K/uL 3.5 3.8 3.7 0.1*   Lymph # K/uL 1.5 0.9* 0.9* 0.5*   Mono # K/uL 0.5 0.7 1.1* 0.0*   Eos # K/uL 0.2 0.1 0.1 0.0   Baso # K/uL 0.1 0.1 0.1 0.0   Seg Neut % 61.0 68.2 63.0 5.1   Lymphocyte % 26.1 15.4 16.0 82.0   Monocyte % 8.5 13.0 18.1 1.3   Eosinophil % 3.5 2.1 1.5 6.4   Basophil % 0.9 1.3 1.4 2.6   Bands %  --   --   --  3   Myelocyte %  --   --   --   --          Lab results: 02/06/21  0931 01/04/21  0900 12/08/20  1242   Sodium 140 140 138   Potassium 4.6 4.3 4.4   Chloride 101 102 102   CO2 24 23 24    UN 15 14 14    Creatinine 0.67 0.62 0.60   Glucose 105* 166* 121*   Calcium 9.6 9.4 9.7   Total Protein 6.3 6.2* 6.3   Albumin 4.4 4.2 4.1   ALT 32 21 35   AST 26 19 35   Alk Phos 98 93 82   Bilirubin,Total 0.5 0.5 0.4     IMAGING DATA:   No imaging ordered prior to this visit.    ASSESSMENT:   Aaryana received final dose of neoadjuvant chemotherapy on December 08, 2020.  She underwent left partial mastectomy and SLNB on January 24, 2021.  Her post op visit with Dr. Freda Munro was just prior to our visit.  Kielee is disappointed that the pathology report is not back yet, otherwise, she denies any concerns related to her surgery/recovery.   She feels she is healing well.      She and her mother will be leaving for [place] (driving) tomorrow morning.  She is hoping to be there until late May. It is recommended that she have left breast radiation therapy beginning in March.  She is hoping it can be arranged for her to have radiation while in [place] (at [facility]; phone: [phone]).  She reports that the surgical team is placing a referral.  In case this does not work out, she has a tentative  appointment with radiation oncology here on March 24, 2021.      She still has implanted central venous access port in place.  I will remind her that this should be flushed minimally every 8-12 weeks.      She has been working with our tobacco cessation team  since early January and reports that she has cut back on cigarettes quite a bit.  She will continue to work with them.      PLAN:     Clinical stage IIB triple negative left breast cancer   Neoadjuvant chemotherapy; final dose administered December 08, 2020   Continue to hold on immunotherapy with pembrolizumab in light of her previous allogenic  bone marrow transplant.    Left partial mastectomy (Dr. Freda Munro) on January 24, 2021; she had post surgical follow up just prior to our visit today (pathology still pending).     Radiation therapy to begin 4-6 weeks after surgery; as discussed above she is hoping for this to be administered in [place].    Typically we would like to see her back in about 12 weeks.  Since she is hoping to be in [place] until late May, we will arrange follow up here for first week in June.  She will notify is if her plans change.    Peripheral neuropathy   Did not receive benefit from acupuncture X 3   Will continue Gabepentin as prescribed by PCP   Some improvement overall but toes still bothersome at night when not distracted; she feels increased dose of gabapentin may be helpful and will discuss with PCP   She feels the warmer weather in [place] may be helpful as well.    Antineoplastic chemotherapy induced anemia   Improving   Recheck CBC prior to next visit or for any new symptoms that could be attributable to anemia.    Left hip pain   Continue to follow with ortho; planning for hip replacement after completion of chemotherapy   Limit use of NSAID's    Discussed option of  taking tylenol extra strength 1000 mg up to every 6-8 hours (not exceeding recommended maximum daily dose) between NSAID medication.    May  continue Arnica gel 1 dime size drop applied to left hip at area of focal tenderness upon rising in am. Do not take NSAIDS within 2 hours of application.    Tobacco dependence   Working with tobacco cessation program (per interval history)    Has been able to "cut back quite a bit"    COVID-19     Patient has received the [place] COVID-19 vaccine, receiving the first dose on May 15, 2019, second dose on Jun 05, 2019  booster dose on October 08, 2019 and the COVID-19 MRNA Bivalent Vaccine Booster Pfizer-Biontech on  October 25, 2020.     Patient advised to continue to take precautions to reduce risk of contracting COVID-19, including social distancing, frequent handwashing and using a mask when in public.    Influenza    She received annual flu vaccine on October 25, 2020.      Patient was reminded to call the clinic at any time with any questions or concerns that may arise in the interval period    Patient complied with masking policy throughout the duration of the appointment. Writer maintained use of mask  throughout  the duration of the appointment.  Writer was within 6 feet of patient for < 5 minutes due to the nature of the appointment.    I personally spent 45 minutes minutes on the calender day of the encounter, including pre and post visit work.    Ival Bible ANP, BC        XL:KGMWN Fredda Hammed, MD  Minus Liberty, MD  Assunta Curtis, MD

## 2021-02-13 ENCOUNTER — Telehealth: Payer: Self-pay | Admitting: Surgical Oncology

## 2021-02-13 NOTE — Telephone Encounter (Signed)
Pt. Calling to speak with the team in regards to a referral for radiation. Pt. Is asking if the team has heard  Anything back for this as of yet.

## 2021-02-13 NOTE — Telephone Encounter (Signed)
Pt was calling pt would like to have radiation completed in [place] please fax information to Maudie Mercury [phone] After 12pm to [phone] att [facility]audie Mercury ([place] phone is [phone] please call pt once faxed  orders if after 12:30pm call [phone] Kim's number

## 2021-02-13 NOTE — Telephone Encounter (Signed)
Calling Lattie Haw to get medical records including recent path to [facility]

## 2021-02-13 NOTE — Telephone Encounter (Signed)
Left voicemail to notify pt that referral for radiation oncology has been faxed to [place].

## 2021-02-13 NOTE — Telephone Encounter (Signed)
Called pt to inform that referral was placed. Pt reports that she left [place] a phone number to call to schedule an appointment or have them call her as she is unable to get through to  [facility] in [place]. Instructed that we only place the referral and she needs to call to schedule an appointment. If she calls and they do not have the referral, she can get a fax number and call us back and we will re-fax referral.

## 2021-02-14 ENCOUNTER — Telehealth: Payer: Self-pay | Admitting: Surgical Oncology

## 2021-02-14 NOTE — Telephone Encounter (Signed)
Hinton Dyer calling needing a demographic on patient faxed. Fax number is [phone].

## 2021-02-16 ENCOUNTER — Telehealth: Payer: Self-pay | Admitting: Surgical Oncology

## 2021-02-16 NOTE — Telephone Encounter (Signed)
Patient calling needing ultrasound, orginial biospy, and mamoogram faxed to cancer center in [place]. Fax numbers are [phone] and [phone]

## 2021-02-21 ENCOUNTER — Telehealth: Payer: Self-pay | Admitting: Surgical Oncology

## 2021-03-01 ENCOUNTER — Ambulatory Visit: Payer: Medicare Other | Attending: Oncology | Admitting: Oncology

## 2021-03-01 DIAGNOSIS — Z171 Estrogen receptor negative status [ER-]: Secondary | ICD-10-CM | POA: Insufficient documentation

## 2021-03-01 DIAGNOSIS — C50412 Malignant neoplasm of upper-outer quadrant of left female breast: Secondary | ICD-10-CM | POA: Insufficient documentation

## 2021-03-01 DIAGNOSIS — F17218 Nicotine dependence, cigarettes, with other nicotine-induced disorders: Secondary | ICD-10-CM | POA: Insufficient documentation

## 2021-03-01 MED ORDER — VARENICLINE TARTRATE 1 MG PO TABS *I*
ORAL_TABLET | ORAL | 5 refills | Status: DC
Start: 2021-03-01 — End: 2021-06-16

## 2021-03-01 NOTE — Patient Instructions (Signed)
Varenicline tartrate/ Chantix/apo-varenicline Instructions  Starter pack for first month and then continuation pack to follow  Take medication each day as instructed by packaging  Entire course of treatment is 6 months to maximize your success  Approximately 1 week after starting varenicline consider setting a quit date  You can smoke and take varenicline together although this medication may cause your taste for cigarettes will change.    If you no longer enjoy the taste of cigarettes try not to smoke.  Generally varenicline is NOT used with nicotine patch, gum, lozenges or inhalers  Take after eating with a full glass of water  Nausea may result if this is taken on an empty stomach  Prior authorization may be required which will depend on your insurance   Although varenicline is generic, it can still have a costly co-pay  Chantix may alter the affects of alcohol.  If you use alcohol and varenicline together please use caution.  Possible side effects  Nausea (usually temporary but if this persists please call)  Insomnia  Abnormal dreams  Please stop the medication and call the office if you experience any change in mood, depression, agitation or feel like harming yourself [phone])

## 2021-03-01 NOTE — Progress Notes (Signed)
Tobacco Dependence Treatment Program    Telephone Visit     03/01/2021     Location of Patient: home    Location of Telemedicine Provider: home / other    Other participants in telemedicine encounter and roles:  LAURIN SAWATZKY     This is an established patient visit.    Reason for visit: No chief complaint on file.    Carly Higgins is in [place] with her mother.  She is pleased that she was able to establish care with an oncology center nearby and will able to do radiation 5 miles away from her home.  She will be following with Dr. Judeth Cornfield.  She plans to stay in [place] until the end of May 2023.  Has been doing her best to stay busy, plans on painting today, swimming and staying busy.    From a tobacco standpoint, she continues to find the cravings for her morning cigarette to be the most challenging.  Acknowledges that she has smoked when getting up in the middle of the night on occasion.  She is smoking 3 -4 cigarettes per day.  Using nicotine gum occasionally, often forgets she has it.    [facility] Tobacco Dependence Treatment Program Summary:   Visit# Date Current Use/Treatment   1 01/11/2021 Carly Higgins is a 61 y.o. female with a history of MPN s/p PBSCT (2017) and left breast cancer which was diagnosed in 2022. She is scheduled for partial mastectomy on 01/24/2021 and plan is to start radiation treatments 4-6 weeks after surgery.  Historically, Carly Higgins has been a smoker since the age of 74 and she has generally been <10 cigarettes/day smoker.  She did trial the Vuse vape but dnot didn't like it, made her cough/choke.  Of note, she quit smoking when she was admitted for PBSCT and remain abstinent for 2 years.  More recently, she has gone up to 5 days without smoking at all (cold [place], no medications) in the past month or so.  She has been experiencing increasing hip/groin pain and may require a hip replacement this year, she would like to quit for his reason as well.   2 02/01/2021 Smoking minimally, a few partial cigarettes  per day.  She has found the nicotine gum to be helpful to curb cravings.   3 03/01/2021 Smoking 3-4 cigarettes per day.  Nicotine gum on occasion.     Patient's problem list, allergies, and medications were reviewed and updated as appropriate. Please see the EHR for full details.    Detailed Tobacco History:   Current Tobacco Use  Social History     Tobacco Use   Smoking Status Every Day   . Packs/day: 0.25   . Years: 30.00   . Pack years: 7.50   . Types: Cigarettes   Smokeless Tobacco Never   Tobacco Comments    Quit for 2 years after BMT in 2017     Initial Visit Tobacco History:  Current use History of  Type(s) of Tobacco   [x]  []  Combustible Cigarettes   []  []  Cigars, Cigarillos and little cigars   []  []  Smokeless Tobacco (chewing tobacco, snuf, snus, dip, or pouches)   []  [x]  Electronic Nicotine Delivery Systems (ENDS; ecigarettes, electronic cigarettes, vapes, mods)   []  []  Other (specify):     Carly Higgins reports smoking since she was 61 years old and current usage is as follows: 6-7 cigarettes per day.  The most she has ever utilized is 10-15 cigarettes per day.  Her first  use of tobacco after waking is 10 minutes.    Longest quit:  quit for 2 years after BMT.  Most recent quit: 5 days within the past month.    Date She rates her willingness to stop using tobacco  She rates her ability to stop using tobacco   01/11/2021 7 out of 10 8 out of 10          Tobacco Dependency Treatment History:   -Nicotine Patch:  Has used.  Reports the following: no relief from withdrawal symptoms and no side effects.  -Nicotine Gum:  Currently Prescribed.  -Nicotine Lozenge:  Has not used.  -Nicotine Inhaler:  Has not used.   -Nicotine Nasal Spray:  Has not used.  -Bupropion:  Has not used.  -Chantix: Has used.  Reports the following: good relief of symptoms.  Restart 02/2021  -Cold [place]- with success at time of her BMT  -The following are contraindicated for her: N/A    Social History:   Employment status: Disabled since BMT.   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  Living situation: alone with her dog.  Other Smokers in the home:  No  .  Children: none.  Caffeine: coffee 1 /day.  Alcohol use: Rarely  Other drug use: when she was younger     Diagnostics/Testing:         Lab results: 02/06/21  0931   Sodium 140   Potassium 4.6   Chloride 101   CO2 24   UN 15   Creatinine 0.67   Glucose 105*   Calcium 9.6     Assessment/Plan:   MPN s/p PBSCT/Breast Cancer/Tobacco Dependence:  Carly Higgins is smoking 3-4 cigarettes per day.  She is planning to start radiation treatments in [place] in the coming month.  Discussed the benefits of quitting tobacco specifically related to cancer treatment, specifically radiation therapy. Quitting smoking can make radiation more effective and potentially lessen side effects related to radiation treatment.  Additionally quitting can reduce risk of cancer recurrence and decrease chances of developing a second primary cancer.     She has had good experience with varenicline in the past.  She would be willing to trial this again.  We reviewed that varenicline may alter the affects of alcohol and should be used together with caution.  If there is any change in mood, depression or suicidal ideation she should stop varenicline and call the office.  Varenicline should be taken after meals with full glass of water.  Discussed the various methods she can utilize for quitting with varenicline (fixed quit date, flexible quit date and reduce to quit).   Rx sent to her local pharmacy in [place].    May continue to use the nicotine gum as needed if breakthrough nicotine withdrawal occurs.    Clinic follow up was requested in 3-4 week(s) and she will call my office in the meantime with any questions or concerns.     The plan was discussed with the patient and the patient/patient rep demonstrated understanding to the provider's satisfaction.    Consent was previously obtained from the patient to complete this telephone consult;  including the potential for financial liability.    11-20 minutes was spent on the phone with the patient, patient representatives, and/or other attendees.     Thank you for allowing me to participate in the care of your patients.  If I can be of any assistance please contact me.    Fredna Dow, DNP, ANP-BC, CTTS, NCTTP  Program  Director Tobacco Dependence Treatment Program  Nurse Practitioner/Certified Tobacco Treatment Specialist  [employer]  [phone]

## 2021-03-06 ENCOUNTER — Telehealth: Payer: Self-pay | Admitting: Hematology and Oncology

## 2021-03-06 NOTE — Telephone Encounter (Signed)
Pt was calling to see how often pt  needs port flushed pt needs appt canceled 03/24/21  Pt is in [place] needs orders sent to [place] [phone] pt needs order faxed to  Dr Tama High

## 2021-03-06 NOTE — Telephone Encounter (Signed)
I spoke with patient who is staying down in [place] longer than she had original planned. She is helping take care of her mother and wants to establish care at [place] with Dr Tama High P: [phone] F: [phone]. She states she would need orders for port flush, labs as needed, and a referral to establish care. I said she should have a port flush at least every 12 weeks (last one was 02/06/21) and I would notify the team for f/u. Patient stated understanding and agrees with plan.

## 2021-03-06 NOTE — Telephone Encounter (Signed)
Writer spoke with the patient and informed her that a referral is not needed to establish care in [place].  Writer will send records including documentation on her port placement so she can have her port flushed without question.  Patient verbalized understanding and appreciation.

## 2021-03-22 ENCOUNTER — Ambulatory Visit: Payer: Medicare Other | Admitting: Oncology

## 2021-03-22 ENCOUNTER — Telehealth: Payer: Self-pay | Admitting: Oncology

## 2021-03-22 NOTE — Telephone Encounter (Signed)
03/22/2021  Attempted to reach patient for telephone visit today, she did not answer.  Left message encouraging patient to call me back to reschedule.  Melynda Keller, NP

## 2021-03-23 NOTE — Progress Notes (Deleted)
Radiation Oncology Follow Up Note 03/24/2021    Breast surgeon:  Minus Liberty, MD                Medical oncologist: Maple Mirza, MD    DIAGNOSIS:    Invasive Ductal Carcinoma of the left  upper outer breast,  Stage IIB   ER negative, PR negative, HER2 negative (IHC1+), Ki-67 70%, Grade 3   Clinical Stage:  CT2 cN0 cM0    HISTORY OF PRESENT ILLNESS:   [name] is a 61 y.o. 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  Shepresents for a brief follow up and radiation planning.The patient was seen at [facility] unaccompanied.       ONCOLOGIC HISTORY:  1. February 2022: She experienced discomfort in the left breast.    2. May 18, 2020: Screening mammogram demonstrated an area of architectural distortion in the middle third of the left breast upper outer quadrant 2:00.  In the right breast, no suspicious masses, calcifications or other abnormalities are seen.  3. Jun 07, 2020: Diagnostic mammogram demonstrated an area of architectural distortion in the middle third upper outer quadrant of the left breast  and ultrasound demonstrated an irregular mass measuring 19 x 19 x 12 mm in the middle third upper outer quadrant of the left breast. Internal echotexture is hypoechoic. There is internal vascularity. It measures up to 2.5 cm radial.  BIRADS 4.  4. June 09, 2020: Ultrasound-guided core needle biopsy of left breast mass at 2:00 with Hydromark coil placed.  Pathology demonstrated grade 3 invasive ductal carcinoma.  ER/PR negative.  HER2 negative (1+ by IHC).  Ki 67 70%.  Post-biopsy mammogram demonstrated biopsy marker to be 1 cm  beyond the expected location of the finding.   5. June 20, 2020: bone scan negative for osseous metastatic disease.  6. June 22, 2020: CT Chest Abdomen Pelvis demonstrated ill-defined enhancing lesion of the lateral left breast measuring 1.7 x 1.2 cm axially and measuring 1.1 cm craniocaudally, asymmetrically prominent left axillary level 1 lymph nodes as described above that demonstrates enhancement of uncertain significance however could represent metastatic disease given the presence of a left breast neoplasm. No clearly suspicious pulmonary nodules identified.  No evidence of metastatic disease within the abdomen or pelvis.  7. June 28, 2020: MR breast demonstrated an irregular mass with spiculated margins, and associated susceptibility artifact consistent with a biopsy clip measuring 10 x 25 x 19 mm seen in the anterior of the left breast upper outer quadrant at 2 o'clock located 3 centimeters from the nipple.  In the right breast, there was no evidence of any mass or suspicious enhancements.   8. July 04, 2020: Ultrasound of left axilla demonstrated a suspicious prominent lymph node in the left axilla with a 4m cortex, for which ultrasound was recommended.  9. July 05, 2020: Ultrasound-guided biopsy of left axillary lymph node with Hydromark coil placement.  Pathology negative for metastatic carcinoma.   10. July 14, 2020: She was initiated on paclitaxel and carboplatin.  Completed fourth cycles September 30, 2020.  Started cyclophosphamide and doxorubicin October 06, 2020 and most recently had her fourth and final cycle on December 08, 2020.  11. Dec 15, 2020: MRI of the breasts show an excellent/complete radiologic response in the left breast. The previously seen irregular spiculated enhancing mass in the left breast does not demonstrate significant residual enhancement. There is no axillary or internal mammary lymphadenopathy. MMG and [place] on Dec 14 are consistent with a excellent/complete response as well.    12. Jan 24, 2021: She underwent a wire localized partial mastectomy and sentinel lymph node biopsy with Minus Liberty which showed a complete pathologic response (ypT0, pN0, cM0, GX), RCB score (0), 0/1 LN+.     CURRENT THERAPY: Pending    INTERVAL HISTORY:   BRIAR SWORD is in our radiation oncology clinic today accompanied by ***. ***    ***initially wanted radiation in [facility]***    Current Medications:  Current Outpatient Medications   Medication Sig    varenicline (APO-VARENICLINE) 1 mg tablet Days 1-3 take 0.48m once daily, Days 4-7 take 0.545mtwice daily and then take 1 mg twice daily.  Take after meals.    aspirin 81 mg EC tablet Take 1 tablet (81 mg total) by mouth daily    prochlorperazine (COMPAZINE) 10 mg tablet Take 1 tablet (10 mg total) by mouth every 6 hours as needed (nausea) (Patient not taking: No sig reported)    nicotine polacrilex (NICORETTE) 2 mg gum Take 1 each (2 mg total) by mouth every 2 hours as needed for Smoking cessation  Max daily dose: 24 mg    atorvastatin (LIPITOR) 40 mg tablet SMARTSIG:1 Tablet(s) By Mouth Every Evening    ondansetron (ZOFRAN) 4 mg tablet Take 1 tablet (4 mg total) by mouth 3 times daily as needed (nausea)    MOUNJARO 5 MG/0.5ML pen SMARTSIG:5 Milligram(s) SUB-Q Once a Week    GABAPENTIN 300 MG capsule Take 300 mg by mouth daily    acyclovir (ZOVIRAX) 400 mg tablet Take 1 tablet (400 mg total) by mouth 2 times daily    metoprolol tartrate (LOPRESSOR) 50 mg tablet Take 1 tablet (50 mg total) by mouth 2 times daily    cholecalciferol (VITAMIN D) 50 MCG (2000 UT) tablet Take 1,000 Units by mouth daily    levothyroxine (SYNTHROID, LEVOTHROID) 50 MCG tablet Take 50 mcg by mouth daily (before breakfast)    escitalopram (LEXAPRO) 20 MG tablet Take 20 mg by mouth daily       Past Medical History:   Diagnosis Date    Atrial fibrillation     fib/flutter 05/2015    Breast cancer     CHF (congestive heart failure)     pulmonary edema 05/28/15    GERD  (gastroesophageal reflux disease)     Hypoxia 06/11/2015    Impaired mobility and ADLs 06/11/2015    Leukocytosis 09/22/2013    post allogenic MUD (F)  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    Conditioned w/ Busulfan 130 mg/m2/d and Fludarabine 40 mg/m2/d x4 days followed by 10/10 HLA MUD (F) Allogeneic PBSCT (pt/donor: ABO: A+/A+, CMV N/N)    Pulmonary edema     Shortness of breath     Tobacco use        Social History     Socioeconomic History    Marital status: Divorced   Tobacco Use    Smoking status: Every Day     Packs/day: 0.25     Years: 30.00     Pack years: 7.50     Types: Cigarettes    Smokeless tobacco: Never    Tobacco comments:  Quit for 2 years after BMT in 2017   Substance and Sexual Activity    Alcohol use: Yes     Comment: occasional    Drug use: No    Sexual activity: Not Currently   Social History Narrative        Marital status: single    Lives in a second floor apartment alone     No children    Pets:  1 dog    Hobbies and interests include nothing at this time    Physical activity: none at this time       Cancer-related family history includes Breast cancer in her paternal aunt. There is no history of Ovarian cancer.    Review of Systems:  Negative except as otherwise mentioned above in the Interval History.     Physical Examination  There were no vitals taken for this visit.  There were no vitals filed for this visit.    Performance Status: {ONC BCN  RAD ONC PERFORMANCE [id number]    GENERA: Well appearing, appearing stated age, pleasant, in NAD, with *** energy  SKIN: Not diaphoretic.  HEENT:  EOMI, anicteric sclera  NECK: No LAD in preauricular, cervical, submandibular, submental, or supraclavicular regions  NEURO: Alert and oriented, visual fields grossly intact, no gross cranial nerve deficit, fluent speech, no dysarthria, no tremor.  LUNGS: CTAB with good inspiratory effort  CV: RRR, S1 and S2 appreciated, no m/g/r  ABDOMEN: Soft, non-tender, non-distended,  normoactive bowel sounds present, no masses appreciated.  MSK: moves all extremities spontaneously and with ease  PSYCH: Cooperative, engaged, with linear thought process, appropriate affect    Laboratory Data Reviewed:   Reviewed.    Diagnostic Data Reviewed:    No results found.     Assessment:  JONIE BURDELL is a 61 y.o. 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      ***    Plan:  - ***LEFT whole breast irradiation ***  - Simulation today  - She was instructed to contact us with any questions or concerns in the interim.     The patient was also seen and evaluated by Dr. Rudi Rummage; formal attestation to follow.     Nicholes Calamity, MD  Resident, PGY-5  Dept of Radiation Oncology  Pager: [phone]

## 2021-03-24 ENCOUNTER — Telehealth: Payer: Self-pay

## 2021-03-24 ENCOUNTER — Telehealth: Payer: Self-pay | Admitting: Radiation Oncology

## 2021-03-24 ENCOUNTER — Ambulatory Visit: Payer: No Typology Code available for payment source | Admitting: Radiation Oncology

## 2021-03-24 ENCOUNTER — Encounter: Payer: No Typology Code available for payment source | Admitting: Radiation Oncology

## 2021-03-24 NOTE — Telephone Encounter (Signed)
Attempted to call patient to check if forgot about the 2:30 appointment today.    Call went to voicemail, no message left.

## 2021-03-24 NOTE — Telephone Encounter (Signed)
RN has seen message and is aware the patient will be doing radiation treatment in [place].  Her appointment will be cancelled, Dr Rudi Rummage is aware.

## 2021-03-24 NOTE — Telephone Encounter (Signed)
Pt. retuning a call to the team. Pt. Stated she is doing this in [place]. Pt. Stated she did try to call and cancel and cancel through the automated call. No other details left.

## 2021-05-17 ENCOUNTER — Other Ambulatory Visit: Payer: Self-pay | Admitting: Hematology

## 2021-05-17 DIAGNOSIS — Z9481 Bone marrow transplant status: Secondary | ICD-10-CM

## 2021-06-01 ENCOUNTER — Ambulatory Visit: Payer: Self-pay | Admitting: Internal Medicine

## 2021-06-06 ENCOUNTER — Encounter: Payer: Self-pay | Admitting: Oncology

## 2021-06-07 ENCOUNTER — Encounter: Payer: Self-pay | Admitting: Oncology

## 2021-06-12 ENCOUNTER — Ambulatory Visit: Payer: Medicare Other | Attending: Oncology

## 2021-06-12 ENCOUNTER — Ambulatory Visit: Payer: Medicare Other | Admitting: Oncology

## 2021-06-12 ENCOUNTER — Other Ambulatory Visit: Payer: Self-pay

## 2021-06-12 VITALS — BP 132/83 | HR 93 | Temp 97.5°F | Wt 237.2 lb

## 2021-06-12 DIAGNOSIS — Z171 Estrogen receptor negative status [ER-]: Secondary | ICD-10-CM | POA: Insufficient documentation

## 2021-06-12 DIAGNOSIS — Z9481 Bone marrow transplant status: Secondary | ICD-10-CM

## 2021-06-12 DIAGNOSIS — Z452 Encounter for adjustment and management of vascular access device: Secondary | ICD-10-CM | POA: Insufficient documentation

## 2021-06-12 DIAGNOSIS — G622 Polyneuropathy due to other toxic agents: Secondary | ICD-10-CM

## 2021-06-12 DIAGNOSIS — Z923 Personal history of irradiation: Secondary | ICD-10-CM

## 2021-06-12 DIAGNOSIS — Z09 Encounter for follow-up examination after completed treatment for conditions other than malignant neoplasm: Secondary | ICD-10-CM

## 2021-06-12 DIAGNOSIS — C50412 Malignant neoplasm of upper-outer quadrant of left female breast: Secondary | ICD-10-CM | POA: Insufficient documentation

## 2021-06-12 DIAGNOSIS — D471 Chronic myeloproliferative disease: Secondary | ICD-10-CM | POA: Insufficient documentation

## 2021-06-12 DIAGNOSIS — Z789 Other specified health status: Secondary | ICD-10-CM

## 2021-06-12 DIAGNOSIS — Z9889 Other specified postprocedural states: Secondary | ICD-10-CM

## 2021-06-12 DIAGNOSIS — M25552 Pain in left hip: Secondary | ICD-10-CM

## 2021-06-12 DIAGNOSIS — F17210 Nicotine dependence, cigarettes, uncomplicated: Secondary | ICD-10-CM | POA: Insufficient documentation

## 2021-06-12 LAB — COMPREHENSIVE METABOLIC PANEL
ALT: 24 U/L (ref 0–35)
AST: 25 U/L (ref 0–35)
Albumin: 4.4 g/dL (ref 3.5–5.2)
Alk Phos: 117 U/L — ABNORMAL HIGH (ref 35–105)
Anion Gap: 12 (ref 7–16)
Bilirubin,Total: 0.5 mg/dL (ref 0.0–1.2)
CO2: 24 mmol/L (ref 20–28)
Calcium: 9.7 mg/dL (ref 8.6–10.2)
Chloride: 102 mmol/L (ref 96–108)
Creatinine: 0.71 mg/dL (ref 0.51–0.95)
Glucose: 93 mg/dL (ref 60–99)
Lab: 10 mg/dL (ref 6–20)
Potassium: 4.3 mmol/L (ref 3.3–5.1)
Sodium: 138 mmol/L (ref 133–145)
Total Protein: 6.9 g/dL (ref 6.3–7.7)
eGFR BY CREAT: 97 *

## 2021-06-12 LAB — CBC AND DIFFERENTIAL
Baso # K/uL: 0 10*3/uL (ref 0.0–0.1)
Basophil %: 0.5 %
Eos # K/uL: 0.2 10*3/uL (ref 0.0–0.4)
Eosinophil %: 2.7 %
Hematocrit: 37 % (ref 34–45)
Hemoglobin: 11.6 g/dL (ref 11.2–15.7)
Lymph # K/uL: 1.6 10*3/uL (ref 1.2–3.7)
Lymphocyte %: 28.7 %
MCH: 28 pg (ref 26–32)
MCHC: 32 g/dL (ref 32–36)
MCV: 90 fL (ref 79–95)
Mono # K/uL: 0.6 10*3/uL (ref 0.2–0.9)
Monocyte %: 10.1 %
Neut # K/uL: 3.2 10*3/uL (ref 1.6–6.1)
Platelets: 270 10*3/uL (ref 160–370)
RBC: 4.1 MIL/uL (ref 3.9–5.2)
RDW: 17.1 % — ABNORMAL HIGH (ref 11.7–14.4)
Seg Neut %: 58 %
WBC: 5.5 10*3/uL (ref 4.0–10.0)

## 2021-06-12 LAB — NEUTROPHIL #-INSTRUMENT: Neutrophil #-Instrument: 3.2 10*3/uL

## 2021-06-12 NOTE — Progress Notes (Signed)
Patient presents for port draw. Port accessed without difficulty, flushes well with positive blood return. Labs drawn per provider order. Port flushed with NS, instilled with heparin, and needle deaccessed per policy. Patient discharged to clinic in stable condition.

## 2021-06-12 NOTE — Progress Notes (Unsigned)
Medical Oncology   [facility]     FOLLOW-UP VISIT    PATIENT NAME:  Carly Higgins, Carly Higgins    DOB:  28-Mar-1960   MRN:  [id number]  06/12/2021     DIAGNOSIS:  Clinical stage IIB left breast cancer    REASON FOR VISIT: Kallista Lacy is seen today for follow up and ongoing recommendations for management of stage IIB (cT2, cN0, cM0) grade 3, ER negative, PR negative, HER2 negative infiltrating ductal carcinoma of the left breast.       She underwent left partial mastectomy and SLNB on January 24, 2021.  Just prior she received doxorubicin and cyclophosphamide (cycle 4 on December 08, 2020) following 4 cycles of weekly paclitaxel and carboplatin as neoadjuvant chemotherapy.      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    In February 2022 she began to experience discomfort in the left breast.  She first attributed this to having been struck with a part of her dog when she picked it up about 1 week earlier.  She continued to experience discomfort intermittently. She scheduled a screening mammogram, having not had an exam since July 2018.         MPN (myeloproliferative neoplasm)   01/19/2014 Initial Diagnosis    MPN (myeloproliferative neoplasm)     07/15/2020 -  Chemotherapy    . Began cycle 1 of weekly paclitaxel and carboplatin on July 15, 2020.  Immunotherapy with pembrolizumab was not recommended due to her previous allogenic bone marrow transplant.  . Final cycle (4) of paclitaxel and carboplatin was administered on September 30, 2020.  . Began dose dense cyclophosphamide and doxorubicin on October 06, 2020    OP Breast CARBOplatin Weekly Paclitaxel then [facility]   Plan Provider: Alison Murray, MD  Treatment goal: Neoadjuvant  Line of treatment: [No plan line of treatment]     IDC left 2 o'clock 2.5 cm, ER/PR- her 2-   05/18/2020 Significant Radiology Findings    Screening [place]:  scattered tissue, architectural distortion left 2 o'clock. BIRAD 0       06/07/2020 Significant Radiology Findings    Ultrasound [place]: irregular hypoechoic mass left 2 o'clock, 19x19x12 mm, 25 mm radial. middle third upper outer quadrant of the left breast. BIRAD 4  Ultrasound guided core IDC     06/09/2020 Procedure    ultrasound-guided core biopsy of the left breast 2:00 lesion:   Biopsy revealed invasive ductal carcinoma.  Provisional combined Nottingham grade was 3 of 3 (tubule formation score 3, nuclear grade 3, mitotic rate score 2).  Tumor cells were negative for estrogen receptors and progesterone receptors.  Tumor cells were negative for HER2 by immunohistochemical staining with a staining score of 1+.  Ki-67 staining was seen in approximately 70% of tumor cells.        06/13/2020 Consultation    She was seen in the Hereditary Oncology clinic :  Because of her prior allogeneic bone marrow transplant, she was not eligible for standard germline testing with blood or saliva specimen.       06/15/2020 Initial Diagnosis    IDC left 2 o'clock 2.5 cm, ER/PR- her 2-     06/15/2020 Cancer Staged    Staging form: Breast, AJCC 8th Edition  - Clinical stage from 06/15/2020: Stage IIB (cT2, cN0, cM0, G3, ER-, PR-, HER2-)     06/15/2020 Consultation  Surgical consultation:  Dr. Claris Gower      06/20/2020 Significant Radiology Findings    Whole-body bone scan : No abnormal focus of radiotracer uptake to suggest osseous metastatic disease.     06/22/2020 Significant Radiology Findings    CT of the chest: mill-defined enhancing lesion in the lateral left breast measuring 1.7 x 1.2 x 1.1 cm.  There were asymmetrically prominent level 1 left axillary lymph nodes, enhancement of uncertain significance, possibly representing metastatic disease. No enlarged mediastinal, hilar, intramammary,  supraclavicular lymph nodes or suspicious pulmonary nodules were identified.       06/22/2020 Significant Radiology Findings    CT of the  abdomen pelvis : no evidence of metastatic disease . small fat filled umbilical hernia noted.     06/28/2020 Significant Radiology Findings    MRI of the breast: irregular mass with spiculated margins measuring 10 x 25 x 19 mm in the left breast upper outer quadrant at the 2 o'clock position located 3 cm from the nipple.      07/15/2020 -  Chemotherapy    . Began cycle 1 of weekly paclitaxel and carboplatin on July 15, 2020.  Immunotherapy with pembrolizumab was not recommended due to her previous allogenic bone marrow transplant.  . Final cycle (4) of paclitaxel and carboplatin was administered on September 30, 2020.  . Began dose dense cyclophosphamide and doxorubicin on October 06, 2020    OP Breast CARBOplatin Weekly Paclitaxel then [facility]   Plan Provider: Alison Murray, MD  Treatment goal: Neoadjuvant  Line of treatment: [No plan line of treatment]     08/18/2020 Procedure    PowerPort in the right internal jugular vein.     12/21/2020 Significant Radiology Findings    Left breast mammogram and ultrasound on December 21, 2020:  Impression:  Mass in the left breast upper outer quadrant at 2 o'clock located 8 centimeters from the nipple is a known malignancy. Surgical consultation is recommended. Patient completed chemotherapy treatment on 12/08/2020. Patient is scheduled for left lumpectomy on 01/24/2021.     Finding 2:  Normal lymph node and biopsy clip in the left axilla is benign.     Finding 3:  Stable calcifications in the left breast are benign.        01/04/2021 Significant Radiology Findings    MRI breasts on December 15, 2020:  Previously seen irregular spiculated enhancing mass in the left breast does not demonstrate significant residual enhancement. A biopsy clip is noted along the medial margin of the previously seen mass.  There is no axillary or internal mammary lymphadenopathy. In the right breast, there is no evidence of any mass or suspicious enhancements.  Impression:  Area in the left breast is a  known malignancy. No significant residual enhancement in the area of previously seen mass, consistent with excellent/complete radiologic response.        01/24/2021 Surgery    Wire localized partial mastectomy and sentinel lymph node biopsy- Sheliah Mends    Complete pathologic response.      02/07/2021 Cancer Staged    Staging form: Breast, AJCC 8th Edition  - Pathologic stage from 02/07/2021: No Stage Recommended (ypT0, pN0, cM0, GX)         INTERVAL HISTORY:  [place] was last seen in our office on February 06, 2021.   Since that time:    She spent the winter in [place]; returning to [place] 2 weeks ago.  She completed radiation treatments to her left breast while in [place];  final treatment administered May 10, 2021.  She reports that she tolerated radiation quite well noticing some fatigue and some hyperpigmentation in the treated area; both of which have improved.    She will be seeing her orthopedic team tomorrow; she is hoping to discuss timing of left hip replacement surgery.  She is also anticipating a cortisone injection which has temporarily helped some in the past.  Overall her left hip pain is worsening now impairing her mobility and level of activity.    She met with tobacco cessation team in January 2023.  She is using nicorette gum and hard candy to curb cravings.  She reports that she has completely stopped smoking.    Implanted central venous access port is still in place.    Otherwise, her appetite is good. She has lost over the past 10 months.  She feels some of this is related to sensation of fullness and believes this is due to her diabetes medication MOUNJARO.  She is happy to report that her HgbA1c has dropped.  She reports ongoing daily morning nausea for which ondansetron has been helpful.  She has experienced this since her BMT.  She has not vomited.  She denies diarrhea or constipation.  Her energy level is slowly improving.  She has not had any recent fevers or symptoms of infection.   She denies respiratory symptoms.  She denies exertional chest pain or palpitations.  She is not experiencing any urinary symptoms.       Prior to beginning chemotherapy she had baseline numbness in her first toe bilaterally.  Neuropathy symptoms worsened in her feet with paclitaxel.  Today she reports that she is still experiencing prickly discomfort and pain in her toes.  She had tried gabapentin and stopped this after she did not find it to be helpful.  She tried acupuncture X 3 did not feel it was helpful.  Had a foot massage once which was calming but did not help with neuropathy.  Gait remains unsteady but more due to left hip pain.  She noticed the bulletin on the wall in the clinic room for a study for patients experiencing neuropathy after chemotherapy; she plans to contact the study coordinator to see if she is eligible.    ROS: As outlined above: comprehensive review of systems is otherwise negative.     Immunization History  She reports that she received annual flu vaccine on October 25, 2020.     Administered Date(s) Administered   . COVID-19 MRNA Bivalent Vaccine Booster Pfizer-Biontech 30 mcg/0.90mL IM SUSP 10/25/2020   . Covid-19 mRNA vaccine (PFIZER) IM 30 mcg/0.42mL 05/15/2019, 06/05/2019, 10/08/2019   . DTaP(Infanrix) 08/01/2016, 09/25/2016, 11/22/2016   . Hepatitis B Adult 08/01/2016, 09/25/2016, 02/21/2017, 08/15/2017   . HiB(Acthib/Hiberix) 08/01/2016, 09/25/2016, 11/22/2016   . IPV 08/01/2016, 09/25/2016, 11/22/2016   . Meningococcal Conjugate MCV4O (Menveo) 08/01/2016   . Pneumococcal 13-valent Conjugate (Prevnar 13) 08/01/2016, 09/25/2016, 11/22/2016   . Pneumococcal Polysaccharide (Pneumovax) 02/21/2017, 08/15/2017   . Zoster(Shingrix) 02/21/2017, 08/15/2017       PAST MEDICAL/SURGICAL HISTORY:   1. 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  Prior to bone marrow  transplant she received 5 cycles of decitabine.  She received fludarabine, busulfan and and hydroxyurea for the transplant and received methotrexate for graft-versus-host prophylaxis.  2. Hypothyroid.  3. Anxiety.  4. Osteoarthritis of the hips, left greater than right.  5. Mild diverticulosis of  the sigmoid colon.  6. History of atrial fibrillation and congestive heart failure in 2017 related to the bone marrow transplant.  7. Right cataract surgery.  8. Right rotator cuff surgery.    MEDICATIONS:     Current Outpatient Medications   Medication Sig   . mupirocin (BACTROBAN) 2 % ointment SMARTSIG:sparingly Both Nares Twice Daily   . omeprazole (PRILOSEC) 20 mg capsule Take 1 capsule (20 mg total) by mouth every morning   . Multivitamin tablet Take 1 tablet by mouth every morning   . aspirin 81 mg EC tablet Take 1 tablet (81 mg total) by mouth daily   . nicotine polacrilex (NICORETTE) 2 mg gum Take 1 each (2 mg total) by mouth every 2 hours as needed for Smoking cessation  Max daily dose: 24 mg   . atorvastatin (LIPITOR) 40 mg tablet SMARTSIG:1 Tablet(s) By Mouth Every Evening   . MOUNJARO 5 MG/0.5ML pen SMARTSIG:5 Milligram(s) SUB-Q Once a Week   . acyclovir (ZOVIRAX) 400 mg tablet Take 1 tablet (400 mg total) by mouth 2 times daily   . levothyroxine (SYNTHROID, LEVOTHROID) 50 MCG tablet Take 1 tablet (50 mcg total) by mouth daily (before breakfast)   . escitalopram (LEXAPRO) 20 MG tablet Take 1 tablet (20 mg total) by mouth daily   . blood glucose (FREESTYLE LITE) test strip Inject 1 strip into the skin daily   . FreeStyle Lancets (FREESTYLE) Inject 1 Lancet into the skin daily   . varenicline (APO-VARENICLINE) 1 mg tablet Days 1-3 take 0.5mg  once daily, Days 4-7 take 0.5mg  twice daily and then take 1 mg twice daily.  Take after meals. (Patient not taking: Reported on 06/15/2021)   . ondansetron (ZOFRAN) 4 mg tablet Take 1 tablet (4 mg total) by mouth 3 times daily as needed (nausea) (Patient not taking: Reported on  06/15/2021)     OBJECTIVE FINDINGS:  Vitals:    06/12/21 1143   BP: 132/83   Pulse: 93   Temp: 36.4 C (97.5 F)   TempSrc: Temporal   SpO2: 98%; room air   Weight: 107.6 kg (237 lb 3.4 oz); down 4.2 kg since last visit  Down ~ 13 kg since August 2022 (see interval history)     Wt Readings from Last 3 Encounters:   06/13/21 107 kg (236 lb)   06/12/21 107.6 kg (237 lb 3.4 oz)   02/06/21 111.8 kg (246 lb 7.6 oz)     Pain    06/12/21 1143   PainSc:   8   PainLoc: Hip; left     PHYSICAL EXAMINATION:     General: Reveals a well-appearing woman; pleasant and engaged. ECOG performance status is 1-2 (due to hip pain).    HEENT : grossly unremarkable; notable for regrowth of scalp hair.  Sclerae are anicteric. Conjunctivae are pink.  Oral mucosa is not examined today as she has no concerns and is wearing mask due to pandemic.   Lymph nodes: There is no palpable cervical, supraclavicular, axillary or inguinal lymph node enlargement.   Chest: clear to auscultation and percussion.  PowerPort is present in the upper chest on the right; site is benign.    Cardiovacular: RRR. No murmur    Breasts: left breast lumpectomy site well-healed.  Left axillary incision well healed.  Fading hyperpigmentation from recent radiation noted; skin is intact.  No palpable masses, irregularities or unusual skin findings noted.    Abdomen: is soft,non tender  Bowel sounds are +  All quadrants.no hepatosplenomegaly, mass or  ascites detected .   Extremities: are without edema.   Neurologic exam: is grossly nonfocal.   Skin: is dry  without rash, petechiae or ecchymoses. patches of vitiligo in the upper abdomen.    LABORATORY DATA:     Lab results: 06/12/21  1144 02/06/21  0931 01/04/21  0900   WBC 5.5 5.7 5.6   Hemoglobin 11.6 10.6* 9.4*   Hematocrit 37 35 31*   RBC 4.1 3.6* 3.0*   Platelets 270 265 271   Neut # K/uL 3.2 3.5 3.8   Lymph # K/uL 1.6 1.5 0.9*   Mono # K/uL 0.6 0.5 0.7   Eos # K/uL 0.2 0.2 0.1   Baso # K/uL 0.0 0.1 0.1   Seg Neut % 58.0  61.0 68.2   Lymphocyte % 28.7 26.1 15.4   Monocyte % 10.1 8.5 13.0   Eosinophil % 2.7 3.5 2.1   Basophil % 0.5 0.9 1.3   Bands %  --   --   --    Myelocyte %  --   --   --            Lab results: 06/12/21  1144 02/06/21  0931 01/04/21  0900   Sodium 138 140 140   Potassium 4.3 4.6 4.3   Chloride 102 101 102   CO2 24 24 23    UN 10 15 14    Creatinine 0.71 0.67 0.62   Glucose 93 105* 166*   Calcium 9.7 9.6 9.4   Total Protein 6.9 6.3 6.2*   Albumin 4.4 4.4 4.2   ALT 24 32 21   AST 25 26 19    Alk Phos 117* 98 93   Bilirubin,Total 0.5 0.5 0.5     IMAGING DATA:   No imaging ordered prior to this visit.    ASSESSMENT:   Jadelynn received final dose of neoadjuvant chemotherapy on December 08, 2020.  She underwent left partial mastectomy and SLNB on January 24, 2021.  [place] spent the winter in [place] ; returning to [place] 2 weeks ago.  She completed radiation treatments to her left breast while in [place] (at [facility]; phone: [phone]); final treatment administered May 10, 2021.  She reports that she tolerated radiation quite well noticing some fatigue and some hyperpigmentation in the treated area; both of which have improved.     She is feeling well today.  Based on symptoms and physical exam there is no obvious evidence of disease recurrence, now 12 months from diagnosis.    She still has implanted central venous access port in place.  I will remind her that this should be flushed minimally every 8-12 weeks.      She has been working with our tobacco cessation team since early January and reports that she has now stopped smoking completely.  She will continue to work with them.      PLAN:     Clinical stage IIB triple negative left breast cancer    Follow up with surgical oncology on August 04, 2021 as scheduled    Left breast mammogram will likely be requested around that time    Annual bilateral mammogram December 2023    Follow up here with medical oncology in 3 months (after which we will likely  move to 6 month follow up); port flush just prior.     Peripheral neuropathy  Did not receive benefit from acupuncture X 3    She stopped Gabepentin (as prescribed by PCP); did not find  effective    She plans to contact study coordinator for current chemotherapy induced peripheral neuropathy study    Antineoplastic chemotherapy induced anemia    Improved; resolved    Left hip pain    Continue to follow with ortho; planning for left hip replacement in the fall 2023    She is seeing them tomorrow    Tobacco dependence    Working with tobacco cessation program (per interval history)     Congratulations on being tobacco free at this time    COVID-19    Patient has received [employer] COVID-19 vaccine, receiving the first dose on May 15, 2019, second dose on Jun 05, 2019  booster dose on October 08, 2019 and the COVID-19 MRNA Bivalent Vaccine Booster Pfizer-Biontech on  October 25, 2020.    Patient advised to continue to take precautions to reduce risk of contracting COVID-19, including social distancing, frequent handwashing and using a mask when in public.    Influenza     She received annual flu vaccine on October 25, 2020.      Patient was reminded to call the clinic at any time with any questions or concerns that may arise in the interval period    Patient complied with masking policy throughout the duration of the appointment. Writer maintained use of mask  throughout  the duration of the appointment.  Writer was within 6 feet of patient for < 5 minutes due to the nature of the appointment.    I personally spent 45 minutes minutes on the calender day of the encounter, including pre and post visit work.    Caren Macadam ANP, BC        ZO:[id number] Sol Blazing, MD  Sheliah Mends, MD  Joeseph Amor, MD

## 2021-06-13 ENCOUNTER — Ambulatory Visit: Payer: Medicare Other | Admitting: Sports Medicine

## 2021-06-13 ENCOUNTER — Encounter: Payer: Self-pay | Admitting: Sports Medicine

## 2021-06-13 VITALS — BP 130/85 | HR 87 | Ht 65.0 in | Wt 236.0 lb

## 2021-06-13 DIAGNOSIS — M1610 Unilateral primary osteoarthritis, unspecified hip: Secondary | ICD-10-CM

## 2021-06-13 MED ORDER — LIDOCAINE HCL 1 % IJ SOLN *I*
1.0000 mL | Freq: Once | INTRAMUSCULAR | Status: AC | PRN
Start: 2021-06-13 — End: 2021-06-13
  Administered 2021-06-13: 1 mL via INTRA_ARTICULAR

## 2021-06-13 MED ORDER — LIDOCAINE HCL 1 % IJ SOLN *I*
5.0000 mL | Freq: Once | INTRAMUSCULAR | Status: AC | PRN
Start: 2021-06-13 — End: 2021-06-13
  Administered 2021-06-13: 5 mL via INTRA_ARTICULAR

## 2021-06-13 MED ORDER — BETAMETHASONE ACET & SOD PHOS 6 (3-3) MG/ML IJ SUSP *I*
12.0000 mg | Freq: Once | INTRAMUSCULAR | Status: AC | PRN
Start: 2021-06-13 — End: 2021-06-13
  Administered 2021-06-13: 12 mg via INTRA_ARTICULAR

## 2021-06-13 NOTE — Progress Notes (Signed)
Chief Complaint:  Hip pain.    History of Present Illness:  This is a 61 y.o. female who presents for follow up regarding her left > right hip pain. Last seen on 01/12/21  for this complaint. She received right intra-articular hip injection has been doing relatively well with them, endorsing partial relief that is still lasting.  She endorses significant persisting left-sided symptoms that did not respond to the injection.  These symptoms are at the lateral hip, though the gluts and leg.  This is worse when trying to sleep at night.  Patient does not want to explore surgical options at this point in time.  Denies numbness/tingling.       Treatment History:    Physical therapy: too painful   Yes [x]  No []  Helpful []  Somewhat helpful []  Not helpful [x]     Exercise: too painful  Yes [x]  No []  Helpful []  Somewhat helpful []  Not helpful [x]     Activity modification:   Yes [x]  No []  Helpful []  Somewhat helpful []  Not helpful [x]     Brace:  Yes []   No [x]   Helpful []   Somewhat helpful []   Not helpful []      Acetaminophen:   Yes []  No [x]  Helpful []  Somewhat helpful []  Not helpful []     NSAIDs:  Ibuprofen 800mg  daily   Yes [x]  No []  Helpful []  Somewhat helpful [x]  Not helpful []      Topicals:  Yes []   No [x]   Helpful []   Somewhat helpful []   Not helpful []      Other Medications: Lexapro  Yes [x]   No []   Helpful []   Somewhat helpful []   Not helpful [x]      Alternative Treatments:  Yes []   No [x]   Helpful []   Somewhat helpful []   Not helpful []      Corticosteroid injections:   Right intra-articular hip 08/31/19 and greater trochanteric bursa 06/25/19.  There is better, long lasting improvement with intra-articular injections.   Yes [x]  No []  Helpful [x]  Somewhat helpful []  Not helpful []     Viscosupplementation:   Yes []  No [x]  Helpful []  Somewhat helpful []  Not helpful []     Radiofrequency Ablation:  Yes []   No [x]   Helpful []   Somewhat helpful []   Not helpful []      Surgery:   Yes []  No [x]  Helpful []  Somewhat helpful []  Not  helpful []     Evaluation to date: I personally reviewed patient's updated x-rays which demonstrate progression of advanced bilateral hip osteoarthritis, with joint space narrowing and subchondral cystic changes.  Moderate osteophytosis.  Radiologist report is not available for review.      MRI L hip 12/14/16  1. Moderate degenerative disease of the left hip with associated acetabular cartilage loss, degenerative tearing of the labrum and a para labral cyst posterosuperiorly.      2. Mild soft tissue inflammation superficial to the greater trochanter without organized fluid collection. Moderate tendinopathy of the gluteus minimus tendon at the insertion on the greater trochanter. The findings may correlate with symptoms of greater   trochanteric bursitis.      3. No avascular necrosis or insufficiency fracture. No joint effusion.      END REPORT       MRI R hip 12/14/16  1. Moderate degenerative disease of the right hip with a focal area of chondral loss involving the anterosuperior acetabulum and subchondral cysts.      2. Mild soft tissue inflammation superficial to the greater trochanter  without organized fluid collection. Mild tendinopathy of the gluteus medius and minimus tendons at the insertion on the greater trochanter. The findings may correlate with symptoms of   greater trochanteric bursitis.      3. No avascular necrosis or insufficiency fracture. No joint effusion.      END REPORT         Past Medical History:  Past Medical History:   Diagnosis Date   . Atrial fibrillation     fib/flutter 05/2015   . Breast cancer    . CHF (congestive heart failure)     pulmonary edema 05/28/15   . GERD (gastroesophageal reflux disease)    . Hypoxia 06/11/2015   . Impaired mobility and ADLs 06/11/2015   . Leukocytosis 09/22/2013   . 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 Pulmonary edema    . Shortness of breath    . Tobacco use        Past Surgical History:  Past Surgical History:   Procedure Laterality Date   . PR AXILLARY LYMPHADENECTOMY COMPLETE Left 01/24/2021    Procedure: sentinel lymph node biopsy;  Surgeon: Sheliah Mends, MD;  Location: [facility] MAIN OR;  Service: Oncology General   . PR MASTECTOMY PARTIAL Left 01/24/2021    Procedure: LEFT NEEDLE LOC PARTIAL MASTECTOMY   ;  Surgeon: Sheliah Mends, MD;  Location: [facility] MAIN OR;  Service: Oncology General   . ROTATOR CUFF REPAIR Right        Social History:  Social History     Socioeconomic History   . Marital status: Divorced   Occupational History   . Occupation: on disability   Tobacco Use   . Smoking status: Former     Packs/day: 0.25     Years: 30.00     Pack years: 7.50     Types: Cigarettes   . Smokeless tobacco: Never   . Tobacco comments:     Quit for 2 years after BMT in 2017   Substance and Sexual Activity   . Alcohol use: Yes     Comment: occasional   . Drug use: No   . Sexual activity: Not Currently   Social History Narrative        Marital status: single    Lives in a second floor apartment alone     No children    Pets:  1 dog    Hobbies and interests include nothing at this time    Physical activity: none at this time         Family History:  Family History   Problem Relation Age of Onset   . No Known Problems Mother    . Throat cancer Father    . No Known Problems Sister    . No Known Problems Brother    . Breast cancer Paternal Aunt    . Ovarian cancer Neg Hx        Review of Systems:  A complete 12 point review of systems is negative except as in HPI and listed below:  Bilateral Hip pain      Medications:    Allergies:  No Known Allergies (drug, envir, food or latex)      OBJECTIVE:   Vital signs:    Vitals:    06/13/21 0906   BP:  130/85   Pulse: 87   Weight: 107 kg (236 lb)   Height: 1.651 m (5\' 5" )     General: no acute distress female  Mental Status:  Alert and oriented x3,  pleasant and cooperative with exam  Extremities: All 4 extremities without edema, clubbing or cyanosis  Gait: Minimally antalgic, able to toe walk, heel walk.     Spine: No swelling, erythema, ecchymosis or step-off deformity.  Patient is nontender to palpation midline or over paravertebral soft tissue.  Nontender to palpation over SI joints bilaterally.  Full active range of motion, increased pain with left lateral bending through the groin and gluts.  Lower extremity strength is 4+/5, symmetric compared to contralateral leg.  Neurovascularly intact.    B/L Hip and Pelvis:  No swelling, erythema, ecchymosis or deformity. No warmth. TTP: Greater Trochanter: yes. There is some diminished range with pain.  Neurovascularly intact.  Decreased strength with hip flexion, extension, abduction and adduction, increased pain with abduction against resistance.     Patrick's / FABER test: positive.     FADIR test: positive.    Hip scouring: positive.      ASSESSMENT & PLAN    1. Hip arthritis  AMB REFERRAL TO ORTHOPEDIC SURGERY    Large Joint Aspiration/Injection Procedure: L hip joint    betamethasone acetate-betamethasone sodium phosphate (CELESTONE) injection 12 mg    lidocaine HCL 1 % injection 5 mL    lidocaine HCL 1 % injection 1 mL          Patient verbalized understanding of information presented,and confirmed agreement with current plan of care.    Precautions reviewed.  Risks and benefits of treatments discussed.  Medication changes, refills reviewed including directions, side effects and monitoring.      This is a 61 y.o. female with chronic bilateral BL hip pain secondary to hip arthritis and gluteal tendinopathy.      Plan:  -Encouraged exercise and continued weight loss,  - patient requests ortho referral to consider timing of THA that she would like in the fall  -Tylenol/OTC topicals and occasional NSAIDs as needed for pain  -USG L hip inj see note  -Referrd to ortho surgery to discuss possible arhtroplasty on  the right     Thank you for allowing [place] to take part in this patient's care and contact us if you have any questions.      Preop Quality Metrics:     Patients < 33 years old--HbA1c ?7   Patients 70 years and older--HbA1c ?7.5   If HbA1c is >7.5 -8, dialogue with Primary Care Physician is required-- please document   BMI < 40   No active infections in body   All medical conditions optimized  - PCP will need to clear for hx of prediabetes / afib etc, oncology will need to clear for hx of myeoloproliferative disease and breast cancer   Smoking cessation required  Alcohol < 3 units/day   Not using IV drugs   Immunosuppressive medications stopped 1-6 weeks preop   Albumin > 3

## 2021-06-13 NOTE — Procedures (Signed)
Large Joint Aspiration/Injection Procedure: L hip joint    Date/Time: 06/13/2021  8:45 AM EDT  Consent given by: patient  Site marked: site marked  Timeout: Immediately prior to procedure a time out was called to verify the correct patient, procedure, equipment, support staff and site/side marked as required     Procedure Details    Location: hip - L hip joint  Preparation: The site was prepped using the usual aseptic technique.  Ultrasound guidance:  Ultrasound was utilized to improve needle visualization, injection accuracy, and anatomic localization.    Anesthetics administered: 5 mL lidocaine HCL 1 %; 1 mL lidocaine HCL 1 %  Intra-Articular Steroids administered: 12 mg betamethasone acetate-betamethasone sodium phosphate 6 (3-3) mg/mL  Dressing:  A dry, sterile dressing was applied.  Patient tolerance: patient tolerated the procedure well with no immediate complications

## 2021-06-15 NOTE — Patient Instructions (Signed)
Follow up with surgical oncology on August 04, 2021 as scheduled    Left breast mammogram will likely be requested around that time    Annual bilateral mammogram December 2023    Follow up here with medical oncology in 3 months (after which we will likely move to 6 month follow up); port flush just prior.

## 2021-06-16 ENCOUNTER — Other Ambulatory Visit: Payer: Self-pay

## 2021-06-16 ENCOUNTER — Encounter: Payer: Self-pay | Admitting: Internal Medicine

## 2021-06-16 ENCOUNTER — Ambulatory Visit: Payer: Self-pay | Admitting: Internal Medicine

## 2021-06-16 VITALS — BP 130/60 | HR 89 | Ht 66.0 in | Wt 238.0 lb

## 2021-06-16 DIAGNOSIS — E119 Type 2 diabetes mellitus without complications: Secondary | ICD-10-CM

## 2021-06-16 DIAGNOSIS — F419 Anxiety disorder, unspecified: Secondary | ICD-10-CM | POA: Insufficient documentation

## 2021-06-16 DIAGNOSIS — D89811 Chronic graft-versus-host disease: Secondary | ICD-10-CM | POA: Insufficient documentation

## 2021-06-16 DIAGNOSIS — I4891 Unspecified atrial fibrillation: Secondary | ICD-10-CM

## 2021-06-16 DIAGNOSIS — D0590 Unspecified type of carcinoma in situ of unspecified breast: Secondary | ICD-10-CM

## 2021-06-16 DIAGNOSIS — K219 Gastro-esophageal reflux disease without esophagitis: Secondary | ICD-10-CM

## 2021-06-16 DIAGNOSIS — K5730 Diverticulosis of large intestine without perforation or abscess without bleeding: Secondary | ICD-10-CM | POA: Insufficient documentation

## 2021-06-16 DIAGNOSIS — F331 Major depressive disorder, recurrent, moderate: Secondary | ICD-10-CM | POA: Insufficient documentation

## 2021-06-16 DIAGNOSIS — E669 Obesity, unspecified: Secondary | ICD-10-CM

## 2021-06-16 DIAGNOSIS — E039 Hypothyroidism, unspecified: Secondary | ICD-10-CM

## 2021-06-16 DIAGNOSIS — M1990 Unspecified osteoarthritis, unspecified site: Secondary | ICD-10-CM

## 2021-06-16 HISTORY — DX: Hypothyroidism, unspecified: E03.9

## 2021-06-16 HISTORY — DX: Unspecified osteoarthritis, unspecified site: M19.90

## 2021-06-16 NOTE — Patient Instructions (Signed)
Watch carb intake as well as sweets; wt loss efforts enc'd; no med changes today; OV as needed for pre-op clearance when scheduled; RTO 6 mos for PE

## 2021-06-16 NOTE — Progress Notes (Signed)
[facility]  Subjective      Carly Higgins, [birth year], had concerns including Follow-up (6 month ). Finished radiation tx in [place] and has hip consult later this summer for potential THR. 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 Is applying for a study. Is not checking BGs.     Review of Systems   Constitutional: Negative.    HENT: Negative.    Respiratory: Negative.    Cardiovascular: Negative.    Gastrointestinal: Negative.    Musculoskeletal: Positive for joint pain.   Neurological: Positive for sensory change.        Neuropathy to B feet   Psychiatric/Behavioral: Negative.            Objective      Physical Exam  Vitals reviewed.   Constitutional:       Appearance: Normal appearance. She is obese.   Cardiovascular:      Rate and Rhythm: Normal rate and regular rhythm.      Heart sounds: Normal heart sounds.   Pulmonary:      Effort: Pulmonary effort is normal.      Breath sounds: Normal breath sounds.   Abdominal:      Palpations: Abdomen is soft.   Musculoskeletal:      Cervical back: Neck supple.   Lymphadenopathy:      Cervical: No cervical adenopathy.   Skin:     General: Skin is warm and dry.   Neurological:      Mental Status: She is alert and oriented to person, place, and time.   Psychiatric:         Mood and Affect: Mood normal.           Vitals  Blood pressure 130/60, pulse 89, height 1.676 m (5\' 6" ), weight 108 kg (238 lb), SpO2 96 %.        Assessment & Plan          1. Diabetes mellitus  DM not as well controlled - HBA1C up to 5.7 - continue diet mod and mounjaro, exercise as able given h/o hip pain  - POCT Hemoglobin A1C    2. Obesity, unspecified classification, unspecified obesity type, unspecified whether serious comorbidity present  Wt loss enc'd - low carb diet and conitnue mounjaro    3. Atrial fibrillation, unspecified type  Not bothersome at this time    4. Gastroesophageal reflux disease without esophagitis  contiune with GERD diet and  omeprazole    5. 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

## 2021-07-17 ENCOUNTER — Other Ambulatory Visit: Payer: Self-pay | Admitting: Oncology

## 2021-07-17 DIAGNOSIS — R110 Nausea: Secondary | ICD-10-CM

## 2021-07-17 DIAGNOSIS — Z9481 Bone marrow transplant status: Secondary | ICD-10-CM

## 2021-07-17 MED ORDER — ONDANSETRON HCL 4 MG PO TABS *I*
4.0000 mg | ORAL_TABLET | Freq: Three times a day (TID) | ORAL | 3 refills | Status: DC | PRN
Start: 2021-07-17 — End: 2021-11-29

## 2021-07-18 ENCOUNTER — Encounter: Payer: Self-pay | Admitting: Internal Medicine

## 2021-07-18 ENCOUNTER — Other Ambulatory Visit: Payer: Self-pay

## 2021-07-18 DIAGNOSIS — E119 Type 2 diabetes mellitus without complications: Secondary | ICD-10-CM

## 2021-07-18 MED ORDER — MOUNJARO 5 MG/0.5ML SC SOAJ
5.0000 mg | SUBCUTANEOUS | 3 refills | Status: DC
Start: 2021-07-18 — End: 2021-08-21

## 2021-07-18 NOTE — Telephone Encounter (Signed)
Patient called and left a voicemail requesting a refill on Mounjaro 5mg   Her previous rx has expired   Rx pended for your approval     Acie Fredrickson, PharmD, [facility]  [facility] [place], [place]   [phone]

## 2021-07-18 NOTE — Progress Notes (Signed)
PA was sent through Cover My Meds  For Mounjaro 5 mg.  Awaiting determination.

## 2021-07-20 ENCOUNTER — Encounter: Payer: Self-pay | Admitting: Internal Medicine

## 2021-07-20 NOTE — Progress Notes (Signed)
Mounjaro 5 mg pen injector was denied by insurance.  The patient will continue to use the manufacturer's coupon.

## 2021-07-21 ENCOUNTER — Other Ambulatory Visit: Payer: Self-pay

## 2021-07-21 ENCOUNTER — Telehealth: Payer: Self-pay | Admitting: Internal Medicine

## 2021-07-24 NOTE — Telephone Encounter (Signed)
Called pt- LMOM- gave above message and explained to pt what she needs to do. Is still having problems to call our office and Florentina Addison can look into this further on what to do.

## 2021-07-31 ENCOUNTER — Other Ambulatory Visit
Admission: RE | Admit: 2021-07-31 | Discharge: 2021-07-31 | Disposition: A | Discharge status: Home / Self Care | Payer: Medicare Other | Source: Ambulatory Visit | Attending: Internal Medicine | Admitting: Internal Medicine

## 2021-07-31 ENCOUNTER — Ambulatory Visit: Payer: Self-pay | Admitting: Internal Medicine

## 2021-07-31 ENCOUNTER — Other Ambulatory Visit: Payer: Self-pay

## 2021-07-31 VITALS — BP 102/64 | HR 68 | Ht 66.0 in | Wt 237.0 lb

## 2021-07-31 DIAGNOSIS — M71029 Abscess of bursa, unspecified elbow: Secondary | ICD-10-CM | POA: Insufficient documentation

## 2021-07-31 DIAGNOSIS — L03113 Cellulitis of right upper limb: Secondary | ICD-10-CM

## 2021-07-31 DIAGNOSIS — L0291 Cutaneous abscess, unspecified: Secondary | ICD-10-CM

## 2021-07-31 LAB — GRAM STAIN: Gram Stain: 0

## 2021-07-31 MED ORDER — DOXYCYCLINE HYCLATE 100 MG PO TABS *I*
100.0000 mg | ORAL_TABLET | Freq: Two times a day (BID) | ORAL | 0 refills | Status: DC
Start: 2021-07-31 — End: 2022-07-09

## 2021-07-31 NOTE — Procedures (Signed)
Incision and Drainage: R elbow    Date/Time: 07/31/2021 9:30 AM    Performed by: Volanda Napoleon, MD  Authorized by: Volanda Napoleon, MD    Consent:     Consent obtained:  Verbal    Consent given by:  Patient    Risks discussed:  Bleeding, incomplete drainage, pain and infection    Alternatives discussed:  Referral  Universal protocol:     Procedure explained and questions answered to patient or proxy's satisfaction: yes      Immediately prior to procedure a time out was called: yes    Location:     Type:  Abscess    Location:  Upper extremity    Upper extremity location:  Elbow    Elbow location:  R elbow  Pre-procedure details:     Skin preparation:  Betadine  Anesthesia (see MAR for exact dosages):     Anesthesia method:  None  Procedure type:     Complexity:  Simple  Procedure details:     Needle aspiration: no      Wound management:  Probed and deloculated and extensive cleaning    Drainage:  Bloody and purulent    Drainage amount:  Moderate    Wound treatment:  Wound left open    Packing materials:  None  Post-procedure details:     Patient tolerance of procedure:  Tolerated well, no immediate complications

## 2021-07-31 NOTE — Progress Notes (Signed)
[facility]  [address]  [place], [place] [address]                  SUBJECTIVE:       Reason For Visit:   Chief Complaint   Patient presents with   . Rash     R elbow rash-thinks it is MRSA-has had April-June on abd       In [place] had a culture of an infection on the stomach   It was MRSA there   Got treated with abx - Doxycycline for 10 days - no issues   Got better     Arm fell off scooter bumped elbow and thinks she has another infection         Review of Systems   Constitutional: Negative for fever.   Musculoskeletal:        Red and tender right elbow             OBJECTIVE      Vitals:    07/31/21 0941   BP: 102/64   Pulse: 68   Weight: 107.5 kg (237 lb)   Height: 1.676 m (5\' 6" )     Body mass index is 38.25 kg/m.      Erythema of the right elbow about the size of a half dollar around a 1 cm raised open pustule that had pus  No epitrochlear lymph node  No induration  Able to move elbow    Due to the bandage she had placed there was some maceration of the lesion      A&P:       Cellulitis of arm, right  Doxycycline 100 mg twice a day for 10 days  Keep the area clean  Can apply mupirocin ointment which she has at home  Open dressing nonstick with Telfa/Kerlix    Abscess  I&D of the wound was done  1 cc of pus removed and sent for Gram stain and culture due to her previous MRSA infection placed on doxycycline           Katasha Riga G CAHN-HIDALGO, MD7/24/20239:52 AM

## 2021-08-02 LAB — AEROBIC CULTURE

## 2021-08-03 NOTE — Progress Notes (Unsigned)
Comprehensive Breast Care at [facility]  Patient Name: Carly Higgins  MRN: [id number]  DOB: [birth year]  Date of Service: 08/04/2021  Provider Name: Kendall Flack, PA-C   ?   DIAGNOSIS:  Cancer Staging   IDC left 2 o'clock 2.5 cm, ER/PR- her 2-  Staging form: Breast, AJCC 8th Edition  - Clinical stage from 06/15/2020: Stage IIB (cT2, cN0, cM0, G3, ER-, PR-, HER2-)  - Pathologic stage from 02/07/2021: No Stage Recommended (ypT0, pN0, cM0, GX)   ?  HPI:  Carly Higgins is a 61 y.o.  patient who presents today for a routine follow up appointment.  She is now 6 months s/p right lumpectomy and SNB with Dr. 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 She then went on to receive   Whole (std of care) breast radiation, completed on [facility] in [place] on 5/3.     Today continues to reports neuropathy in her feet and left hip pain for which she is seeing orthopedics in a few weeks. She is hoping to have a hip replacement soon. She developed a MRSA infection on her stomach a few months ago and now more recently on her right elbow for which she is taking antibiotics. She denies any new breast concerns including: palpable breast findings, nipple discharge, skin changes or breast pain. She denies arm issues including heaviness, swelling, infections, changes in how her clothes or jewelry fit, range of motion issues.     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    In February 2022 she began to experience discomfort in the left breast.  She first attributed this to having been struck with a part of her dog when she picked it up about 1 week earlier.  She continued to experience discomfort intermittently. She scheduled a screening mammogram, having not had an exam since July 2018.         Myelodysplastic or  myeloproliferative disease   01/19/2014 Initial Diagnosis    MPN (myeloproliferative neoplasm)     07/15/2020 -  Chemotherapy    . Began cycle 1 of weekly paclitaxel and carboplatin on July 15, 2020.  Immunotherapy with pembrolizumab was not recommended due to her previous allogenic bone marrow transplant.  . Final cycle (4) of paclitaxel and carboplatin was administered on September 30, 2020.  . Began dose dense cyclophosphamide and doxorubicin on October 06, 2020    OP Breast CARBOplatin Weekly Paclitaxel then [facility]   Plan Provider: Alison Murray, MD  Treatment goal: Neoadjuvant  Line of treatment: [No plan line of treatment]     IDC left 2 o'clock 2.5 cm, ER/PR- her 2-   05/18/2020 Significant Radiology Findings    Screening [place]: scattered tissue, architectural distortion left 2 o'clock. BIRAD 0       06/07/2020 Significant Radiology Findings    Ultrasound [place]: irregular hypoechoic mass left 2 o'clock, 19x19x12 mm, 25 mm radial. middle third upper outer quadrant of the left breast. BIRAD 4  Ultrasound guided core IDC     06/09/2020 Procedure    ultrasound-guided core biopsy of the left breast 2:00 lesion:   Biopsy revealed invasive ductal carcinoma.  Provisional combined Nottingham grade was 3 of 3 (tubule formation score 3, nuclear grade 3, mitotic rate score 2).  Tumor cells were negative for  estrogen receptors and progesterone receptors.  Tumor cells were negative for HER2 by immunohistochemical staining with a staining score of 1+.  Ki-67 staining was seen in approximately 70% of tumor cells.        06/13/2020 Consultation    She was seen in the Hereditary Oncology clinic :  Because of her prior allogeneic bone marrow transplant, she was not eligible for standard germline testing with blood or saliva specimen.       06/15/2020 Initial Diagnosis    IDC left 2 o'clock 2.5 cm, ER/PR- her 2-     06/15/2020 Cancer Staged    Staging form: Breast, AJCC 8th Edition  - Clinical stage from 06/15/2020: Stage IIB (cT2, cN0,  cM0, G3, ER-, PR-, HER2-)     06/15/2020 Consultation    Surgical consultation:  Dr. Claris Gower      06/20/2020 Significant Radiology Findings    Whole-body bone scan : No abnormal focus of radiotracer uptake to suggest osseous metastatic disease.     06/22/2020 Significant Radiology Findings    CT of the chest: mill-defined enhancing lesion in the lateral left breast measuring 1.7 x 1.2 x 1.1 cm.  There were asymmetrically prominent level 1 left axillary lymph nodes, enhancement of uncertain significance, possibly representing metastatic disease. No enlarged mediastinal, hilar, intramammary,  supraclavicular lymph nodes or suspicious pulmonary nodules were identified.       06/22/2020 Significant Radiology Findings    CT of the abdomen pelvis : no evidence of metastatic disease . small fat filled umbilical hernia noted.     06/28/2020 Significant Radiology Findings    MRI of the breast: irregular mass with spiculated margins measuring 10 x 25 x 19 mm in the left breast upper outer quadrant at the 2 o'clock position located 3 cm from the nipple.      07/15/2020 -  Chemotherapy    . Began cycle 1 of weekly paclitaxel and carboplatin on July 15, 2020.  Immunotherapy with pembrolizumab was not recommended due to her previous allogenic bone marrow transplant.  . Final cycle (4) of paclitaxel and carboplatin was administered on September 30, 2020.  . Began dose dense cyclophosphamide and doxorubicin on October 06, 2020    OP Breast CARBOplatin Weekly Paclitaxel then [facility]   Plan Provider: Alison Murray, MD  Treatment goal: Neoadjuvant  Line of treatment: [No plan line of treatment]     08/18/2020 Procedure    PowerPort in the right internal jugular vein.     12/21/2020 Significant Radiology Findings    Left breast mammogram and ultrasound on December 21, 2020:  Impression:  Mass in the left breast upper outer quadrant at 2 o'clock located 8 centimeters from the nipple is a known malignancy. Surgical consultation is  recommended. Patient completed chemotherapy treatment on 12/08/2020. Patient is scheduled for left lumpectomy on 01/24/2021.     Finding 2:  Normal lymph node and biopsy clip in the left axilla is benign.     Finding 3:  Stable calcifications in the left breast are benign.        01/04/2021 Significant Radiology Findings    MRI breasts on December 15, 2020:  Previously seen irregular spiculated enhancing mass in the left breast does not demonstrate significant residual enhancement. A biopsy clip is noted along the medial margin of the previously seen mass.  There is no axillary or internal mammary lymphadenopathy. In the right breast, there is no evidence of any mass or suspicious enhancements.  Impression:  Area in the  left breast is a known malignancy. No significant residual enhancement in the area of previously seen mass, consistent with excellent/complete radiologic response.        01/24/2021 Surgery    Wire localized partial mastectomy and sentinel lymph node biopsy- Sheliah Mends    Complete pathologic response.      02/07/2021 Cancer Staged    Staging form: Breast, AJCC 8th Edition  - Pathologic stage from 02/07/2021: No Stage Recommended (ypT0, pN0, cM0, GX)         REVIEW OF SYSTEMS: Comprehensive ROS was taken on the patient intake form and reviewed with the patient.  Pertinent items are noted in HPI.    ?PAST MEDICAL HISTORY:  Past Medical History:   Diagnosis Date   . Atrial fibrillation     fib/flutter 05/2015   . Breast cancer    . CHF (congestive heart failure)     pulmonary edema 05/28/15   . Diabetes mellitus 08/23/2020   . GERD (gastroesophageal reflux disease)    . Hypothyroidism 06/16/2021   . Hypoxia 06/11/2015   . Impaired mobility and ADLs 06/11/2015   . Leukocytosis 09/22/2013   . Lumbar radiculopathy 01/11/2015   . Osteoarthrosis 06/16/2021   . 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 Pulmonary edema    . Shortness of breath    . Tobacco use        MEDICATIONS:  Current Outpatient Medications   Medication Sig   . cholecalciferol, Vitamin D3, (VITAMIN D3) 50 mcg (2,000 unit) tablet Take 0.5 tablets (1,000 units total) by mouth   . atorvastatin (LIPITOR) 40 mg tablet Take 1 tablet (40 mg total) by mouth nightly   . doxycycline hyclate (VIBRA-TABS) 100 mg tablet Take 1 tablet (100 mg total) by mouth 2 times daily for 10 days  for Infection Under the Skin   . MOUNJARO 5 MG/0.5ML pen Inject 0.5 mLs (5 mg total) into the skin every 7 days   . metoprolol tartrate (LOPRESSOR) 50 mg tablet Take 1 tablet (50 mg total) by mouth 2 times daily   . ondansetron (ZOFRAN) 4 mg tablet Take 1 tablet (4 mg total) by mouth 3 times daily as needed (nausea)   . aspirin 81 mg EC tablet Take 1 tablet (81 mg total) by mouth daily   . acyclovir (ZOVIRAX) 400 mg tablet Take 1 tablet (400 mg total) by mouth 2 times daily   . levothyroxine (SYNTHROID, LEVOTHROID) 50 MCG tablet Take 1 tablet (50 mcg total) by mouth daily (before breakfast)   . escitalopram (LEXAPRO) 20 MG tablet Take 1 tablet (20 mg total) by mouth daily   . nystatin (MYCOSTATIN) 100000 UNIT/GM cream Apply topically 2 times daily  to the following areas: apply to the rash under the right breast   . PREVIDENT 5000 DRY MOUTH 1.1 % dental gel SMARTSIG:To Teeth   . blood glucose (FREESTYLE LITE) test strip Inject 1 strip into the skin daily   . FreeStyle Lancets (FREESTYLE) Inject 1 Lancet into the skin daily       ALLERGIES:  No Known Allergies (drug, envir, food or latex)    FAMILY HISTORY:  Cancer-related family history includes Breast cancer in her paternal aunt. There is no history of Ovarian cancer.    PHYSICAL EXAMINATION:  Vitals: BP 112/64  Pulse 67   Temp 35.7 C (96.3 F) (Temporal)   Wt 107.5 kg (237 lb)   SpO2 96%   BMI 38.25 kg/m   Gen:  Well nourished, well developed female.  No  acute distress.  Lungs: clear to auscultation bilaterally  Heart: regular rate and rhythm  Lymphatics:  No cervical, supraclavicular or axillary adenopathy bilaterally.   Breast Exam: The patient is examined in the sitting and supine positions.  Symmetric in size and contour.  No erythema or peau d'orange.  Bilateral everted nipples with no spontaneous discharge and no perimembranous changes. Red plaque rash under the right breast and on the upper abdomen.  No discrete masses noted bilaterally.   Extremities: extremities normal, atraumatic, no cyanosis or edema. There is not evidence of lymphedema. ROM is within normal limits  Neurologic: Grossly normal    IMAGING: n/a  ?  ASSESSMENT AND PLAN:  Carly Higgins is a 61 y.o. female now 6 months s/p right lumpectomy and SNB with Dr. 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   There is no evidence of local regional recurrence at the visit today. She is due for a bilateral diagnostic mammogram and this will be scheduled today at [facility] in September. On exam today she has evidence of a right breast fungal infection in her IMF for which I prescribed her Nystatin cream. We discussed keeping the area dry as possible and wearing a bra to prevent skin to skin contact.      She will return for follow up in March 2024.  She understands that she should call me with any complaints or concerns in the interim.     Medical oncology: Dr. Griffith Citron (09/14/21)   Radiation oncology: [facility] in [place]     Total time spent on 08/04/21 for this visit in face-to-face and non face-to-face time reviewing, obtaining, and documenting clinical information, coordinating with the care team and other specialists, and counseling the patient: 30 minutes.     Kendall Flack, PA-C

## 2021-08-04 ENCOUNTER — Ambulatory Visit: Payer: Medicare Other | Attending: Surgical Oncology | Admitting: Surgical Oncology

## 2021-08-04 ENCOUNTER — Other Ambulatory Visit: Payer: Self-pay

## 2021-08-04 VITALS — BP 112/64 | HR 67 | Temp 96.3°F | Wt 237.0 lb

## 2021-08-04 DIAGNOSIS — Z171 Estrogen receptor negative status [ER-]: Secondary | ICD-10-CM | POA: Insufficient documentation

## 2021-08-04 DIAGNOSIS — L304 Erythema intertrigo: Secondary | ICD-10-CM | POA: Insufficient documentation

## 2021-08-04 DIAGNOSIS — C50412 Malignant neoplasm of upper-outer quadrant of left female breast: Secondary | ICD-10-CM | POA: Insufficient documentation

## 2021-08-04 MED ORDER — NYSTATIN 100000 UNIT/GM EX CREA *I*
TOPICAL_CREAM | Freq: Two times a day (BID) | CUTANEOUS | 1 refills | Status: DC
Start: 2021-08-04 — End: 2022-08-10

## 2021-08-04 NOTE — Patient Instructions (Signed)
[facility]  A different approach to cancer care.    Our Integrative Oncology program is uniquely focused on helping patients with cancer improve their quality of life by lowering stress, improving mood, decreasing pain and empowering patients to be in control of their care. Integrative oncology programs and services focus on the whole patient. Not just treating cancer, but helping patients manage symptoms, improve their quality of life, and feel more in control of their care. All of our instructors and staff are uniquely qualified to teach based upon their credentials, as well as their skills and training in working with cancer patients.     For more information on any of these programs, call: Judy Zeeman-Golden, Integrative Oncology Manager at [phone].    Explore our website where you can find all of our Virtual Resources:  [email or web address]        MOVEMENT  Evidence suggests that physical activity is effective in relieving some of the symptoms of cancer treatment such as fatigue and sleep related problems.  Regular exercise is also shown to decrease the risk of certain cancers and extend survival.   TOUCH     Evidence suggests that healing touch, such as massage therapy and acupuncture is helpful in decreasing anxiety, nausea and vomiting, as well as increasing relaxation and improving mood. Acupuncture can help patients with chemotherapy induced nausea and vomiting as well as sleep issues.      NUTRITION  Research shows that healthy eating habits and specialized diets can be used to alleviate side effects of cancer treatment. A diet that focuses on whole plants has been shown to improve health and reduce risk of several cancers, as well as counter the risks leading to obesity.  MINDFULNESS    Evidence shows that the use of mind/body techniques such as meditation, yoga and Qi Gong or other  creative means of expression can help diminish anxiety, stress, pain and fatigue.      The IOC Virtually       Our Integrative Oncology team, with support from the [facility], has put together a collection of resources to help cancer patients and our community manage the stresses of cancer and COVID-19 isolation. We have several weekly live classes available via Zoom .    If you would like to be added to our regular email distribution list, please email [email or web address] This will keep you in the know about all IOC programs and services.  The Integrative Oncology Team has put together 35 different You Tube videos that cover a wide range of topics such as yoga, meditation and healthy cooking.  You can watch these videos on your phone or computer at a time and place that are convenient to you. To see our diverse collection of You Tube videos;    http://www.youtube.com/playlist?list=[email or web address]    Please enjoy these resources and contact us at [phone] with questions.     To sign up for any of our classes or programs, call Kelly at [phone].

## 2021-08-11 ENCOUNTER — Telehealth: Payer: Self-pay | Admitting: Internal Medicine

## 2021-08-11 NOTE — Telephone Encounter (Signed)
[facility] called and requested assistance with obtaining a new Mounjaro coupon   I called her pharmacy and gave the new coupon information:   RxBIN: [id number]  PCN: [id number]  RxGRP: [id number]  ID: [id number]    They said that since it is not covered by insurance, the cost will be $504 with the coupon.  They sent over a PA request electronically.  I completed the PA request.  We will await a response.  If not approved, we may need to switch her to Ozempic.     Acie Fredrickson, PharmD, [facility]  Internal Medicine of [place], [place]   [phone]

## 2021-08-21 ENCOUNTER — Telehealth: Payer: Self-pay

## 2021-08-21 ENCOUNTER — Other Ambulatory Visit: Payer: Self-pay

## 2021-08-21 DIAGNOSIS — E119 Type 2 diabetes mellitus without complications: Secondary | ICD-10-CM

## 2021-08-21 MED ORDER — SEMAGLUTIDE (0.25 OR 0.5MG/DOSE) 2 MG/3ML SC SOPN *I*
0.5000 mg | PEN_INJECTOR | SUBCUTANEOUS | 5 refills | Status: DC
Start: 2021-08-21 — End: 2021-08-31

## 2021-08-21 NOTE — Telephone Encounter (Signed)
Agree  Seen   Med sent In   [facility]

## 2021-08-21 NOTE — Telephone Encounter (Signed)
[facility] of [place] - Comprehensive Medication Management Note    Carly Higgins is a 61 y.o. female who presents for follow-up visit with the clinical pharmacist. Method of visit: Telephone. Patient was referred by PCP for comprehensive medication management in the setting of uncontrolled diabetes.         I spoke with Carly Higgins.  Her Carly Higgins has gone to the appeals dept since it has now been denied twice.  We have not heard back from appeals as far as I know.  We discussed attempting to switch to Ozempic instead.  I made her aware that this will also require a PA but it should be covered since she has T2DM.             There were no vitals filed for this visit.     Additional labs/vitals as documented in chart - reviewed all relevant objective info at time of visit.    Current Outpatient Medications   Medication Sig Dispense Refill   ? nystatin (MYCOSTATIN) 100000 UNIT/GM cream Apply topically 2 times daily  to the following areas: apply to the rash under the right breast 30 g 1   ? PREVIDENT 5000 DRY MOUTH 1.1 % dental gel SMARTSIG:To Teeth     ? cholecalciferol, Vitamin D3, (VITAMIN D3) 50 mcg (2,000 unit) tablet Take 0.5 tablets (1,000 units total) by mouth     ? atorvastatin (LIPITOR) 40 mg tablet Take 1 tablet (40 mg total) by mouth nightly     ? MOUNJARO 5 MG/0.5ML pen Inject 0.5 mLs (5 mg total) into the skin every 7 days 6 mL 3   ? metoprolol tartrate (LOPRESSOR) 50 mg tablet Take 1 tablet (50 mg total) by mouth 2 times daily     ? ondansetron (ZOFRAN) 4 mg tablet Take 1 tablet (4 mg total) by mouth 3 times daily as needed (nausea) 30 tablet 3   ? blood glucose (FREESTYLE LITE) test strip Inject 1 strip into the skin daily     ? FreeStyle Lancets (FREESTYLE) Inject 1 Lancet into the skin daily     ? aspirin 81 mg EC tablet Take 1 tablet (81 mg total) by mouth daily     ? acyclovir (ZOVIRAX) 400 mg tablet Take 1 tablet (400 mg total) by mouth 2 times daily 180 tablet 3   ? levothyroxine (SYNTHROID,  LEVOTHROID) 50 MCG tablet Take 1 tablet (50 mcg total) by mouth daily (before breakfast)     ? escitalopram (LEXAPRO) 20 MG tablet Take 1 tablet (20 mg total) by mouth daily  5     No current facility-administered medications for this visit.         Assessment and Plan:  With regards to medication management for this patient, the following aspects were reviewed: Indication, effectiveness, safety and convenience of each medication. The patient's medical conditions and medications were assessed, evaluated, and deemed meeting goals of drug therapy except as listed below:     1. T2DM   ? Consider switching to Ozempic since we are having a hard time getting coverage of Mounjaro       Other Medication-Related Interventions: In addition to the medication changes noted above, these medication-related interventions were completed by the Pharmacist during today's visit:  ? resolved medication access issue    This report and pended medication orders were sent to Dr. Sol Blazing for review and final approval. Patient was able to verbalize understanding and repeat back key educational points.     Follow-up:  Pharmacist will follow-up with patient via phone in a couple days to let her know the outcome of Ozempic PA .     Patient Status: Open to [facility]     Duration of Visit: 1-15 min    Thank you for allowing me to participate in the care of this patient. Please contact me with any questions.     Acie Fredrickson, PharmD, [facility]  Internal Medicine of [place], [place]   [phone]

## 2021-08-24 ENCOUNTER — Ambulatory Visit: Payer: Medicare Other | Admitting: Orthopedic Surgery

## 2021-08-24 ENCOUNTER — Ambulatory Visit
Admission: RE | Admit: 2021-08-24 | Discharge: 2021-08-24 | Disposition: A | Discharge status: Home / Self Care | Payer: Medicare Other | Source: Ambulatory Visit

## 2021-08-24 ENCOUNTER — Other Ambulatory Visit: Payer: Self-pay | Admitting: Hematology

## 2021-08-24 ENCOUNTER — Encounter: Payer: Self-pay | Admitting: Internal Medicine

## 2021-08-24 ENCOUNTER — Other Ambulatory Visit: Payer: Self-pay

## 2021-08-24 ENCOUNTER — Encounter: Payer: Self-pay | Admitting: Orthopedic Surgery

## 2021-08-24 VITALS — BP 129/62 | HR 64 | Ht 66.0 in | Wt 244.2 lb

## 2021-08-24 DIAGNOSIS — M85852 Other specified disorders of bone density and structure, left thigh: Secondary | ICD-10-CM

## 2021-08-24 DIAGNOSIS — M160 Bilateral primary osteoarthritis of hip: Secondary | ICD-10-CM | POA: Insufficient documentation

## 2021-08-24 DIAGNOSIS — M25552 Pain in left hip: Secondary | ICD-10-CM

## 2021-08-24 DIAGNOSIS — M1612 Unilateral primary osteoarthritis, left hip: Secondary | ICD-10-CM | POA: Insufficient documentation

## 2021-08-24 NOTE — Patient Instructions (Signed)
Department of Orthopedics, Dr. Nathan Kaplan     Scheduling Instructions:     To schedule the date and location of your surgery, call Dr. Kaplan's office [phone] and choose option #2, or call the scheduling team directly at [phone].    If you are unable to reach a member of the scheduling team, our surgical nurse navigator team can be reached at [phone].    When you schedule your surgical date, you will also make several appointments to prepare you for joint replacement. Please use the below checklist to keep track of your appointment dates and times:    The date of your surgery:________________; The location of your surgery: [facility] or [facility]    Preoperative medical evaluation with your primary care doctor:________________  This should be scheduled 2-4 weeks before your surgery date. Dr. Kaplan's office will require a letter from your primary doctor indicating that it is medically safe to proceed with surgery.  If you see any specialists for your heart, lungs or kidney health, Dr. Kaplan's office will also require medical clearance from each of those providers to proceed with surgery.  The sooner these visits are completed, the more streamlined the preparation process will be.    Presurgical Testing assessment and visit with the Anesthesia department (either at [facility] or [facility]): ____________________     Preoperative appointment with Dr. Kaplan: ____________________  If your surgery is at [facility] [facility], this should be scheduled on the same day as your Presurgical Testing visit at [facility] (#4 above)  If your surgery is at [facility], this should be scheduled on a different day, ideally after your Presurgical Testing visit at [facility], at the [place] office    **You should designate a support person/health coach to help you arrange help and assistance at home after surgery, transportation to and from the  hospital and appointments, and who will stay with you at home to help during your early recovery.**        Begin learning about joint replacement surgery and recovery!    To begin the joint replacement educational process on your own and for several resources about how you can optimize your success as a joint replacement candidate, please refer to the below website links:    [email or web address]  [email or web address]    The above resources will provide you with information such as:  General information about hip and knee pain and the conditions that cause these symptoms  A downloadable Guidebook (.pdf) to hip and knee replacement with information about:  The joint replacement procedure  How to prepare for surgery  Anesthesia options  Your hospital stay  Pain management  The recovery process  Equipment you may need after surgery  The rehabilitation exercises you can do before surgery, and that you will do after surgery  Frequently asked questions about joint replacement surgery    Other resources to help you prepare for joint replacement surgery, provided by leading academic institutions:    American Association of Hip and Knee Surgeons:  https://hipknee.aahks.org    American Academy of Orthopedic Surgeons:  https://orthoinfo.aaos.org/en/treatment/?topic=JointReplacement      Steps You Can Take to Achieve Happiness after Joint Replacement   (adapted from hipknee.aahks.org)    If you are considering hip or knee replacement surgery, here are some general guidelines you can follow to increase the likelihood that you will be satisfied after your surgery. These can help guide you in your decision-making process.      Take a more active role in your overall health before surgery. Read the article, “Good Health=Good Recovery.”  If you have anxiety/depression, ensure that you’re managing the condition before  surgery.  Reduce or eliminate the use of narcotics before and after surgery (the more pain medicine you take before surgery, the harder it is to manage your pain after surgery). It is important not to quit “cold-turkey” and for you to wean off narcotics under the direction of a medical professional.  If you have a small amount of arthritis and are getting around fairly well, continue with non-surgical options for joint pain. Read the article, “Relieving Hip and Knee Pain Without Surgery.”  Enlist a family member or friend to be your “joint replacement coach” to help you through the recovery process.

## 2021-08-24 NOTE — H&P (Signed)
New Patient Hip Pain Evaluation      CHIEF COMPLAINT: Carly Higgins presents for orthopaedic consultation regarding their left hip pain.        HPI: The patient is a 61 y.o. female who presents as a new patient for evaluation of the above chief complaint.      Duration of symptoms: 3 years  It developed in a gradual manner.  Prior injury? No  Pain: groin, posterior and lateral or peritrochanteric, radiation: Yes down anterior thigh   Pain quality: burning, sharp and shooting  Activities that exacerbate the pain: walking long distances, standing for prolonged periods of time and lying on the affected side/hip.    Improved by rest? No  Pain frequency: constant    The pain does wake the patient from sleep at night.  The pain does occur with every step the patient takes.    The patient is using an assistive device for ambulation due to pain and balance.  Prior surgery on the affected joint(s)? No    Comprehensive therapeutic interventions to this date for the patient's joint pain include:  Activity modification: Yes  Physical therapy: No  Bracing/orthotics: No  Medical therapy:  Anti-inflammatory, NSAIDS: Yes  Acetaminophen: Yes  Supplements: No  Narcotics or other pain relievers: No  Intra-articular injections:  Corticosteroid: Yes, last injection: 06/13/21 w/ Dr. Titus Mould  Viscosupplementation: No  Other supplemental treatments attempted: None    Other pertinent history: Recurrent MRSA infections, Atrial fibrillation, Ventricular tachycardia, DM2, Hx of Breast Cancer, GERD, Lumbar Radiculopathy    The patient does have a known history of back pain with associated radicular symptoms and with numbness and/or tingling.  Activities of daily living and enjoyment affected by the patient's joint pain: Everything      The patient's PERTINENT PAST HISTORIES (Medical, Surgical, Social, Allergies, ROS) were reviewed and updated in the electronic medical record as indicated.  Current medications were updated during this office  encounter.    Above histories were obtained from external medical records and documents that were each personally reviewed.  An independent historian was not required to complete above history taking.       PHYSICAL EXAMINATION:     The patient's height, weight and body-mass-index, as well as the patients vital signs and temperature are documented in the electronic medical record, and were reviewed. The patient's body mass index is Body mass index is 39.41 kg/m.Marland Kitchen    General: The patient is well-developed, well-nourished  Neuro: Alert, oriented to person, place and time and cooperative.   Psychiatric: Normal mood and affect.  HEENT: Normocephalic, atraumatic.   Cardiopulmonary: Respirations unlabored on room air, no audible wheezing or stridor, peripheral circulation intact, extremities all well perfused.  Musculoskeletal: Focused examination of the lumbar spine and affected hip(s) is performed.  The patient ambulates with a antalgic gait favoring the left lower extremity, using a cane for gait assistance and stability.   An examination of the patient's leg lengths reveals that they are clinically equal.  Back:   Tenderness to palpation: No  Pain with flexion, extension and lateral motion of the lumbar spine: Yes  Straight leg raise positive bilateral leg  Left hip:   Skin: no skin lesions, rashes, or discoloration  Prior scars: none   Hip range of motion:  Flexion: 85  Internal rotation: 5  External rotation: 5   Abduction: 5    There is pain with range of motion of the hip, particularly any range of motion.  Special testing:  Stinchfield maneuver: positive    Distal neurovascular:  Motor: quadriceps, dorsiflexion, plantar flexion, and EHL intact  Sensation: sensation is intact to light touch in saphenous/sural/tibial/deep and superficial peroneal nerve distributions   Circulation: Dorsalis pedis pulses palpable and symmetric bilaterally, toes demonstrate brisk capillary refill.   Peripheral vascular skin  changes/ulcers: No      STUDY REVIEW AND INTERPRETATION:     Radiographs: Standard weight bearing AP pelvis, AP and lateral radiographs of the left hip obtained today were reviewed. The radiographs reveal evidence of severe, end stage degenerative changes involving the femoral-acetabular articulation and include joint space narrowing, bone-on-bone articulation, peripheral osteophyte formation and subchondral sclerosis.  No other osseous abnormalities are noted.    Advanced imaging: none    Laboratory studies: none pertinent      IMPRESSION: Carly Higgins is a 61 y.o. female who presents for evaluation of Chronic and Progressive left hip pain consistent with a diagnosis of:  1. Severe, end-stage degenerative osteoarthritis, left hip   2. Comorbidities: Atrial fibrillation, Ventricular tachycardia, Lumbar radiculopathy, DM2, Diverticulosis, GERD, Hx of breast cancer with chemo, Hx of recurrent MRSA infections.    PLAN:   The patient and I had a lengthy discussion in the office today regarding the etiology, pathogenesis, natural history, and treatment options of this condition. We specifically reviewed ongoing and available conservative treatment options including physical therapy and activity modification as a means of strengthening and optimizing joint health and mobility, weight loss and lifestyle modification, and symptomatic medical management using non-steroidal anti-inflammatory medications, acetaminophen, and the option of intermittent intra-articular steroid injections. We reviewed avoidance of narcotic pain medication in long term management of inflammatory joint pain.  We briefly discussed alternative options for symptomatic relief that have less support in studies focused on evidence-based care for osteoarthritis such as visco-supplementation, intra-articular PRP and stem cell therapy, and nerve cryotherapy/ablation techniques.  We briefly reviewed the indication for surgical intervention in the form of  joint replacement upon failure of reasonable effort of conservative treatment options, impact of symptoms on quality of life and ability to maintain independence or function, and/or the likelihood of failure or exacerbation of symptoms with prolonged conservative management.  We extensively discussed the relevant spectrum of disease and symptomatic indications for the above interventions including the possibility of eventual surgical treatment.    At this point, The patient has exhausted the conservative treatment options proposed, and continues to experience progressively debilitating pain leading to immobility, loss of function, deteriorating quality of life, and inability to perform their activities of daily living. I believe the patient is a candidate for total hip arthroplasty as a means of pain control to improve quality of life and function. I specifically discussed that the proposed surgery is ideal for treating the pain from inflammation due to joint pathology and that other extra-articular sources of pain may not be fully relieved after full recovery.  Improvements in range of motion, joint stability, and alignment are secondary reasons to perform this surgery, and improvements in these domains are less predictable. The patient and I extensively discussed the established benefits as outlined above, the risks specific to total hip replacement, and the nonsurgical and surgical alternatives to arthroplasty for long term management of their end-stage, severe joint disease with debilitating symptoms.  Furthermore, we reviewed the long term implications of undergoing joint replacement including the importance of future activity modifications to avoid high impact activity to maximize implant longevity. After expressing verbal understanding, and answering all questions, the patient agreed  that surgical treatment is the next best step in treatment of their condition given the failure of conservative treatment and  the significant impact their pain and symptoms have on their quality of life.      We discussed the variety of technical strategies available to perform this procedure, as well as risks and benefits specific to each particular strategy including specific surgical approaches, alignment and implant fixation strategies, the intraoperative use of technology (fluoroscopy, computer navigation, robotic arm assistance, augmented/mixed reality).  We reviewed modern evidence on arthroplasty bearing surfaces and the risks/benefits to the various combinations of materials available.    Preoperative preparation, perioperative scheduling, postoperative expectations and rehabilitation were all reviewed in the office today.  The surgical plan was outlined. She will move forward with scheduling preoperative multidisciplinary medical evaluation, optimization, and clearance for the procedure with her primary care physician, as well as with the presurgical testing team in the preoperative testing clinic.  Any chronic medical comorbidities or modifiable risk factors that predispose the patient to or increase the risk of specific complications associated with joint replacement may require optimization prior to proceeding or scheduling surgery. In addition, she  will attend a preoperative joint replacement informational and educational teaching class.  Lastly, she  will schedule a preoperative appointment with me to reveal and address any final questions, preoperative concerns, and review informed consent for the proposed procedure.      Comorbidities that may affect outcome/risk of surgery that may require optimization and planning: Atrial fibrillation, Ventricular tachycardia, Lumbar radiculopathy, DM2, Diverticulosis, GERD, Hx of breast cancer with chemo, Hx of recurrent MRSA infections, h/o BMT  Specialists to be involved in optimization:  PCP    All questions and concerns regarding surgery were addressed to patient's satisfaction and  I look forward to seeing Carly Higgins on the day of her preoperative evaluation.     Unique studies/tests ordered today: hip xrays, reviewed  Medical or Drug therapy ordered today: No  Elective major surgery planned today: Yes     Next follow up: Preoperative appointment  Studies to order for next visit:     All questions and concerns were invited and addressed to the patient's satisfaction.    Fransisca Connors, MD   Elsworth Soho, FNP-C    Division of Adult Reconstruction  [facility]  Department of Orthopaedics and Rehabilitation

## 2021-08-24 NOTE — Progress Notes (Signed)
PA for Ozempic 2mg /54ml was ssent through CoverMyMeds-awaiting determination

## 2021-08-24 NOTE — Progress Notes (Signed)
[facility] SURGERY WORKSHEET    Surgery Location: [x]  [facility] []  [facility] []  Either location    Procedure/CPT Code:  []  Right      [x]  Left     HIP  Approach: [x]  Posterior      []  Anterior        [x]  27130 Total Hip Arthroplasty        []  27122 Girdlestone/Resection Arthroplasty          []  27125 Hemiarthroplasty         []  27236 Hemiarthroplasty for fracture         []  27132 Hip Conversion, previous hip surgery converted to THA         []  27134 Revision THA (w/ or w/o bone graft): both components  []  27137 Revision THA (w/ or w/o bone graft): acetabular component only  []  27138 Revision THA (w/ or w/o bone graft): femoral component only   []  27091 Resection THA w/ or w/o insertion of spacer    []  11981, 51 mod PLUS Insertion, non-biodegradable drug delivery implant         []  11983 Removal and re-insertion, drug delivery implant         []  27134 Reimplant THA   (+58 mod, if within global period of explant)    []  11982, 51 mod PLUS Removal, non-biodegradable drug delivery implant             []  26992 I&D Hip joint, bone cortex            []  16109 I&D Hip joint, deep soft tissue   []  27265 Closed reduction s/p THA w/o anesthesia   []  27266 Closed reduction s/p THA w/anesthesia (regional or GA)   []  27235 Percutaneous Pinning Femoral neck fx   []  27245 IM nail Inter/Peri/Sub trochanteric Fx        []  Other:       KNEE         []  27447 Total Knee Arthroplasty    []  Cemented    []  Cementless      []  20985 Computer Navigation TKA         []  60454 Single component TKA revision         []  27446 Unicompartmental knee Arthroplasty (medial or lateral)         []  27442 Patellofemoral Arthroplasty         []  27487 Revision TKA   []  27488 Resection TKA w/ or w/o insertion of spacer    []  11981, 51 mod PLUS Insertion, non-biodegradable drug delivery implant         []  11983 Removal and re-insertion, drug delivery implant         []  27487 Reimplant TKA   (+58 mod, if within global period of explant)    []   11982, 51 mod PLUS Removal, non-biodegradable drug delivery implant             []  27486, 52 mod I&D TKA w/ poly exchange            []  27301 I&D knee, deep soft tissue   []  27310 I&D knee, joint/arthrotomy   []  27570 Manipulation under anesthesia   []  20680 Removal of Hardware   []  [phone] Secondary reconstruction, quad tendon   []  27381 Secondary reconstruction, patella tendon        []  Other:       OTHER CODES            []  12020  Simple closure, wound dehiscence      []  27036 HO excision    Modifiers  AS PA Assisted  22 Increased Procedural Svc  50 Bilateral  51 Multiple procedure, same provider  52 Reduced Procedural Svc  58 Planned staged procedure in global period      Length of Procedure:  [x]  2 hours, 45 min []  4 hours  []  other:     Postoperative visit:  [x]  4 weeks []  2 weeks  []  other:     Special Labs/Tests:    [x]  CBC, SMA 12, PT/PTT, Type & Screen, Hemoglobin A1C, Serum Preg (Fem), MSSA and MRSA nasal Swab, Vitamin D    [x]  ESR, CRP   []  UA w/C+S   []  EKG   []  Chest XR   []  Drug screen   []  Tobacco labs: urine nicotine metabolites, serum carboxyhemoglobin   []  Other:    Anesthesia Requested:    [x]  Single shot spinal (intrathecal)   [x]  Sedation   []  General Anesthesia   []  Regional Blocks:   []  Adductor Canal Block   []  Fascia Iliaca Block   []  Femoral Nerve Block   []  IPACK  []  Popliteal - Sciatic Block     Equipment:   []  Cemented TKA: Zimmer Persona with MC polyethylene    []  Cementless TKA: Stryker Triathlon Tritanium               []  Cemented UKA: Zimmer Persona Partial Knee system            [x]  THA: Stryker Trident 2 Tritanium acetabular system, Insignia femoral system  []  THA: Depuy Pinnacle with Gription acetabular system, Actis femoral system   []  Knee Revision, implants:   []  Implant/Cement removal tools: Engineer, petroleum, Osteotomes, Microsagittal saw, Single/double sided recip saw, Fan saw blade, Reverse currettes, Midas Rex (round/pencil tipped burrs)  []  Stryker flexible reamers  []  Stryker  ream only tibial and femoral cones  []  Zimmer trabecular metal cones  []  Augments  []  Other:  []  Hip Revision, implants:   []  Implant removal tools: Midas Rex (round/pencil tipped burrs), Flexible osteotomes, ACL screw tap  []  Femoral component: Midas Rex (round/pencil tipped burrs), Flexible osteotomes, Shukla extraction system  []  ETO Equipment: Microsagittal saw, Midas Rex (6 mm round and pencil tipped burrs), long K wires, osteotomes (1/4 inch AND broad), small Gigli saws  []  Acetabular component: Hemispherical cup osteotomes  []  Cement removal tools: Engineer, petroleum, Osteotomes, Reverse currettes, Midas Rex (round/pencil tipped burrs)  []  Stryker flexible reamers  []  Stryker Dall-Miles beaded cables  []  Zimmer Trephine system  []  Zimmer trabecular metal acetabular augments  []  Other:  []  Other:      Preoperative Clearance/Consults Needed?   [x]  PCP (med clearance)   [x]  Anesthesia ([facility])     []  Hematology    []  Pulmonology    []  Cardiology    []  Neurology       []  Pain           []  Other:    COMMENTS:

## 2021-08-25 ENCOUNTER — Telehealth: Payer: Self-pay

## 2021-08-25 ENCOUNTER — Other Ambulatory Visit
Admission: RE | Admit: 2021-08-25 | Discharge: 2021-08-25 | Disposition: A | Discharge status: Home / Self Care | Payer: Medicare Other | Source: Ambulatory Visit | Attending: Internal Medicine | Admitting: Internal Medicine

## 2021-08-25 DIAGNOSIS — C50412 Malignant neoplasm of upper-outer quadrant of left female breast: Secondary | ICD-10-CM | POA: Insufficient documentation

## 2021-08-25 DIAGNOSIS — E119 Type 2 diabetes mellitus without complications: Secondary | ICD-10-CM | POA: Insufficient documentation

## 2021-08-25 DIAGNOSIS — D471 Chronic myeloproliferative disease: Secondary | ICD-10-CM | POA: Insufficient documentation

## 2021-08-25 DIAGNOSIS — R200 Anesthesia of skin: Secondary | ICD-10-CM | POA: Insufficient documentation

## 2021-08-25 DIAGNOSIS — R202 Paresthesia of skin: Secondary | ICD-10-CM | POA: Insufficient documentation

## 2021-08-25 DIAGNOSIS — Z171 Estrogen receptor negative status [ER-]: Secondary | ICD-10-CM | POA: Insufficient documentation

## 2021-08-25 LAB — NEUTROPHIL #-INSTRUMENT: Neutrophil #-Instrument: 2.7 10*3/uL

## 2021-08-25 LAB — COMPREHENSIVE METABOLIC PANEL
ALT: 29 U/L (ref 0–35)
AST: 24 U/L (ref 0–35)
Albumin: 4.4 g/dL (ref 3.5–5.2)
Alk Phos: 120 U/L — ABNORMAL HIGH (ref 35–105)
Anion Gap: 14 (ref 7–16)
Bilirubin,Total: 0.5 mg/dL (ref 0.0–1.2)
CO2: 23 mmol/L (ref 20–28)
Calcium: 9.4 mg/dL (ref 8.6–10.2)
Chloride: 101 mmol/L (ref 96–108)
Creatinine: 0.85 mg/dL (ref 0.51–0.95)
Glucose: 118 mg/dL — ABNORMAL HIGH (ref 60–99)
Lab: 18 mg/dL (ref 6–20)
Potassium: 4.8 mmol/L (ref 3.3–5.1)
Sodium: 138 mmol/L (ref 133–145)
Total Protein: 6.5 g/dL (ref 6.3–7.7)
eGFR BY CREAT: 78 *

## 2021-08-25 LAB — CBC AND DIFFERENTIAL
Baso # K/uL: 0 10*3/uL (ref 0.0–0.1)
Basophil %: 0.8 %
Eos # K/uL: 0.2 10*3/uL (ref 0.0–0.4)
Eosinophil %: 3.2 %
Hematocrit: 38 % (ref 34–45)
Hemoglobin: 11.8 g/dL (ref 11.2–15.7)
IMM Granulocytes #: 0 10*3/uL (ref 0.0–0.0)
IMM Granulocytes: 0.2 %
Lymph # K/uL: 1.5 10*3/uL (ref 1.2–3.7)
Lymphocyte %: 30.5 %
MCH: 28 pg (ref 26–32)
MCHC: 31 g/dL — ABNORMAL LOW (ref 32–36)
MCV: 92 fL (ref 79–95)
Mono # K/uL: 0.5 10*3/uL (ref 0.2–0.9)
Monocyte %: 10.4 %
Neut # K/uL: 2.7 10*3/uL (ref 1.6–6.1)
Nucl RBC # K/uL: 0 10*3/uL (ref 0.0–0.0)
Nucl RBC %: 0 /100 WBC (ref 0.0–0.2)
Platelets: 237 10*3/uL (ref 160–370)
RBC: 4.2 MIL/uL (ref 3.9–5.2)
RDW: 16.9 % — ABNORMAL HIGH (ref 11.7–14.4)
Seg Neut %: 54.9 %
WBC: 5 10*3/uL (ref 4.0–10.0)

## 2021-08-25 LAB — TSH: TSH: 2.51 u[IU]/mL (ref 0.27–4.20)

## 2021-08-25 LAB — VITAMIN B12: Vitamin B12: 258 pg/mL (ref 232–1245)

## 2021-08-25 LAB — HEMOGLOBIN A1C: Hemoglobin A1C: 5.5 %

## 2021-08-25 LAB — LACTATE DEHYDROGENASE: LD: 163 U/L (ref 118–225)

## 2021-08-25 LAB — MULTIPLE ORDERING DOCS

## 2021-08-25 NOTE — Telephone Encounter (Signed)
Chart reviewed, okay to schedule surgery      The following education was sent to the patient through [employer]      I am sending this message to let you know your surgeon has given us your name to schedule you for Joint Replacement. You will be getting an email from [email or web address] to set up LOG IN information to the Educational Platform. Once you complete that, you will receive a TIME SENSITIVE questionnaire via [employer].  Please complete this very important questionnaire ASAP and submit it back through [employer].   Our Nurse Navigator team will review this document and if all is in place-we will forward to the schedulers.  Also, once you have a surgery date, please contact your Primary Care Physician and any Specialist (i.e., Cardiologist, Neurologist) your surgeon requested to schedule a Medical Clearance appointment and call the nurse navigators with the dates of these appointments.  Call the nurse navigators if you have any questions or concerns.  [phone]

## 2021-08-26 ENCOUNTER — Encounter: Payer: Self-pay | Admitting: Orthopedic Surgery

## 2021-08-29 ENCOUNTER — Encounter: Payer: Self-pay | Admitting: Internal Medicine

## 2021-08-29 NOTE — Progress Notes (Signed)
Ozempic denied.  Her plan does not cover this medication.and she has not had a failure with metformin for her diabetes

## 2021-08-31 ENCOUNTER — Other Ambulatory Visit: Payer: Self-pay

## 2021-08-31 ENCOUNTER — Ambulatory Visit: Payer: Self-pay | Admitting: Internal Medicine

## 2021-08-31 ENCOUNTER — Encounter: Payer: Self-pay | Admitting: Internal Medicine

## 2021-08-31 ENCOUNTER — Encounter: Payer: Self-pay | Admitting: Gastroenterology

## 2021-08-31 VITALS — BP 124/76 | HR 86 | Ht 66.0 in | Wt 241.0 lb

## 2021-08-31 DIAGNOSIS — M25552 Pain in left hip: Secondary | ICD-10-CM

## 2021-08-31 DIAGNOSIS — M1990 Unspecified osteoarthritis, unspecified site: Secondary | ICD-10-CM

## 2021-08-31 DIAGNOSIS — R202 Paresthesia of skin: Secondary | ICD-10-CM

## 2021-08-31 DIAGNOSIS — E039 Hypothyroidism, unspecified: Secondary | ICD-10-CM

## 2021-08-31 DIAGNOSIS — T451X5A Adverse effect of antineoplastic and immunosuppressive drugs, initial encounter: Secondary | ICD-10-CM | POA: Insufficient documentation

## 2021-08-31 DIAGNOSIS — E119 Type 2 diabetes mellitus without complications: Secondary | ICD-10-CM

## 2021-08-31 DIAGNOSIS — D471 Chronic myeloproliferative disease: Secondary | ICD-10-CM

## 2021-08-31 DIAGNOSIS — I4891 Unspecified atrial fibrillation: Secondary | ICD-10-CM

## 2021-08-31 MED ORDER — SEMAGLUTIDE (0.25 OR 0.5MG/DOSE) 2 MG/3ML SC SOPN *I*
0.5000 mg | PEN_INJECTOR | SUBCUTANEOUS | 5 refills | Status: DC
Start: 2021-08-31 — End: 2021-09-14

## 2021-08-31 MED ORDER — DULOXETINE HCL 30 MG PO CPEP *I*
30.0000 mg | DELAYED_RELEASE_CAPSULE | Freq: Every day | ORAL | 5 refills | Status: DC
Start: 2021-08-31 — End: 2021-12-25

## 2021-08-31 MED ORDER — METOPROLOL TARTRATE 50 MG PO TABS *I*
50.0000 mg | ORAL_TABLET | Freq: Two times a day (BID) | ORAL | 5 refills | Status: DC
Start: 2021-08-31 — End: 2022-03-24

## 2021-08-31 NOTE — Patient Instructions (Signed)
Escitalopram (Lexapro) taper  Alternate 1 tablet with half a tablet for 1 week  Then  Go to half a tablet every day for 1 week  Then  Go to half a tablet alternating with 1/4 tablet for 1 week  Then  Go to 1/4 tablet every day for a week  Then  Stop    You can start the duloxetine (Cymbalta) 30 mg once a day starting today    If you have any question or concerns please feel free to reach out to me    Letter be sent to Dr. Arlyce Dice today    Have a good weekend  [facility]

## 2021-08-31 NOTE — Progress Notes (Signed)
[facility]  [address]  [place], [place] [address]                  SUBJECTIVE:       Reason For Visit:   Chief Complaint   Patient presents with   ? 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  Brothers friend takes pregabalin for this.  Also has hip surgery oct 13th.    Needs clearance letter to Dr. Harrold Donath Kaplan/kk       Foot - numbness with feet - not worsening - but present   Tried Gabapentin at 300mg  BID   Did not help - never went higher   Questions Lyrica    Wants the surgery   No concerns about the surgery   Looking forward to less hip pain    Review of Systems   Constitutional: Negative.    Eyes: Negative.    Respiratory: Negative.    Cardiovascular: Negative.    Gastrointestinal: Negative.    Genitourinary: Negative.    Neurological: Negative for headaches.           OBJECTIVE      Vitals:    08/31/21 1128   BP: 124/76   Pulse: 86   Weight: 109.3 kg (241 lb)   Height: 1.676 m (5\' 6" )     Body mass index is 38.9 kg/m?Marland Kitchen        Physical Exam  Constitutional:       Appearance: Normal appearance.   HENT:      Head: Normocephalic.   Eyes:      Extraocular Movements: Extraocular movements intact.      Conjunctiva/sclera: Conjunctivae normal.      Pupils: Pupils are equal, round, and reactive to light.   Cardiovascular:      Rate and Rhythm: Normal rate and regular rhythm.      Pulses: Normal pulses.      Heart sounds: Normal heart sounds.   Pulmonary:      Effort: Pulmonary effort is normal.      Breath sounds: Normal breath sounds.   Abdominal:      General: Abdomen is flat. Bowel sounds are normal.      Palpations: Abdomen is soft.   Musculoskeletal:         General: No swelling.      Right lower leg: No edema.      Left lower leg: No edema.   Neurological:      General: No focal deficit present.      Mental Status: She is alert.   Psychiatric:         Mood and Affect: Mood normal.         Behavior: Behavior normal.       Mild decrease in  vibratory sensation to both feet  Normal microfilament  Light touch was abnormal to her from a sensation standpoint      A&P:   Neuropathy unspecified   We will do Cymbalta 30 mg tablet  We will taper off escitalopram slowly  Discussed with Delrae Alfred Pharm.D.  Close monitoring and communication in order to achieve maximal benefit dose    Bilateral hip pain.  Osteoarthritis, unspecified osteoarthritis type, unspecified site  Medically optimized for surgery  We will be able to follow-up with Dr. Arlyce Dice  Continue pain control as she does not currently does    Myeloproliferative disease  Per Dr. Misty Stanley felt  Follow-up of her stem cell transplant  No new changes  Very stable CBC    Atrial fibrillation, unspecified type  EKG done today sinus rhythm  Continue metoprolol  Continue current medications  Diet exercise  Weight loss  Lifestyle modification    Type 2 diabetes mellitus without complication, without long-term current use of insulin  She is at the prediabetic state now  We will consider starting Ozempic back up  Continue diet and exercise  Weight loss    Hypothyroidism, unspecified type  TSH at goal  Replete appropriately           Albert Hersch G CAHN-HIDALGO, MD8/24/202311:36 AM

## 2021-09-01 ENCOUNTER — Telehealth: Payer: Self-pay | Admitting: Internal Medicine

## 2021-09-01 DIAGNOSIS — E119 Type 2 diabetes mellitus without complications: Secondary | ICD-10-CM

## 2021-09-01 NOTE — Telephone Encounter (Signed)
[facility] spoke with Carly Higgins   We will try metformin first per insurance   Rx pended     Acie Fredrickson, PharmD, [facility]  [facility] [place], [place]   [phone]

## 2021-09-13 MED ORDER — METFORMIN HCL 500 MG PO TABS *I*
500.0000 mg | ORAL_TABLET | Freq: Two times a day (BID) | ORAL | 5 refills | Status: DC
Start: 2021-09-13 — End: 2021-09-14

## 2021-09-14 ENCOUNTER — Ambulatory Visit: Payer: Medicare Other | Attending: Hematology and Oncology | Admitting: Hematology and Oncology

## 2021-09-14 ENCOUNTER — Ambulatory Visit
Admission: RE | Admit: 2021-09-14 | Discharge: 2021-09-14 | Disposition: A | Discharge status: Home / Self Care | Payer: Medicare Other | Source: Ambulatory Visit | Attending: Surgical Oncology | Admitting: Surgical Oncology

## 2021-09-14 ENCOUNTER — Other Ambulatory Visit: Payer: Self-pay

## 2021-09-14 ENCOUNTER — Encounter: Payer: Self-pay | Admitting: Hematology and Oncology

## 2021-09-14 VITALS — BP 114/75 | HR 59 | Temp 96.1°F | Wt 247.8 lb

## 2021-09-14 DIAGNOSIS — Z171 Estrogen receptor negative status [ER-]: Secondary | ICD-10-CM | POA: Insufficient documentation

## 2021-09-14 DIAGNOSIS — C50412 Malignant neoplasm of upper-outer quadrant of left female breast: Secondary | ICD-10-CM | POA: Insufficient documentation

## 2021-09-14 DIAGNOSIS — N6489 Other specified disorders of breast: Secondary | ICD-10-CM | POA: Insufficient documentation

## 2021-09-14 DIAGNOSIS — Z853 Personal history of malignant neoplasm of breast: Secondary | ICD-10-CM | POA: Insufficient documentation

## 2021-09-14 NOTE — Progress Notes (Signed)
Medical Oncology   [facility]     FOLLOW-UP VISIT    PATIENT NAME:  Carly, Higgins    DOB:  February 10, 1960   MRN:  [id number]  09/14/2021     DIAGNOSIS:  Clinical stage IIB left breast cancer    REASON FOR VISIT: Carly Higgins is seen today for follow-up of clinical stage IIB (cT2, cN0, cM0) grade 3, ER negative, PR negative, HER2 negative infiltrating ductal carcinoma of the left breast.      in anticipation of starting cycle 1 of doxorubicin and cyclophosphamide as neoadjuvant chemotherapy for   She has now completed 4 cycles of weekly paclitaxel and carboplatin.    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  In February 2022 she began to experience discomfort in the left breast.  She first attributed this to having been struck with a part of her dog when she picked it up about 1 week earlier.  She continued to experience discomfort intermittently.  She was in [place] at the time and did not seek any evaluation.      When she returned to [place] she scheduled a screening mammogram, having not had an exam since July 2018.  This was performed on May 18, 2020 and revealed an area of architectural distortion in the middle third of the left breast upper outer quadrant at the 2 o'clock position.    On Jun 07, 2020 diagnostic mammogram and breast ultrasound was performed.  The mammogram revealed scattered areas of fibroglandular densities with an area of architectural distortion again noted at the 2 o'clock position of the left breast.  Breast ultrasound revealed an irregular mass measuring 19 x 19 x 12 mm in the middle third upper outer quadrant of the left breast.  Internal echotexture was hypoechoic and there was internal vascularity.      Based on these findings, ultrasound-guided core biopsy of the left breast 2:00 lesion was performed on June 09, 2020.  Biopsy revealed invasive ductal carcinoma.  Provisional  combined Nottingham grade was 3 of 3 (tubule formation score 3, nuclear grade 3, mitotic rate score 2).  Tumor cells were negative for estrogen receptors and progesterone receptors.  Tumor cells were negative for HER2 by immunohistochemical staining with a staining score of 1+.  Ki-67 staining was seen in approximately 70% of tumor cells.    She was seen by Dr. Claris Gower for surgical consultation at which time she was noted to have a masslike area at the 2 o'clock position of the left breast which felt like a hematoma.  Staging scans were requested.    She was seen in the Hereditary Oncology clinic on June 13, 2020. Because of her prior allogeneic bone marrow transplant, she was not eligible for standard germline testing with blood or saliva specimen.      Whole-body bone scan performed on June 20, 2020 revealed no abnormal focus of radiotracer uptake to suggest osseous metastatic disease.    CT of the chest performed with contrast on June 22, 2020 revealed an ill-defined enhancing lesion in the lateral left breast measuring 1.7 x 1.2 x 1.1 cm.  There were asymmetrically prominent level 1 left axillary lymph nodes that demonstrated enhancement of uncertain significance.  These were felt possibly to represent metastatic disease.  There were no enlarged mediastinal, hilar, intramammary or supraclavicular lymph nodes.  No suspicious pulmonary nodules were identified.  CT of the abdomen pelvis performed at the  same time revealed no evidence of metastatic disease within the abdomen and pelvis.  A small fat filled umbilical hernia was noted.    MRI of the breast performed on June 28, 2020 revealed an irregular mass with spiculated margins measuring 10 x 25 x 19 mm in the left breast upper outer quadrant at the 2 o'clock position located 3 cm from the nipple.    Neoadjuvant chemotherapy was recommended and she began cycle 1 of weekly paclitaxel and carboplatin on July 15, 2020.  Immunotherapy with pembrolizumab was  not recommended due to her previous allogenic bone marrow transplant.    On August 18, 2020 she underwent placement of PowerPort in the right internal jugular vein.    INTERVAL HISTORY:           She reports that she has been smoking several cigarettes a day.    PAST MEDICAL/SURGICAL HISTORY:   1. 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  Prior to bone marrow transplant she received 5 cycles of decitabine.  She received fludarabine, busulfan and and hydroxyurea for the transplant and received methotrexate for graft-versus-host prophylaxis.  2. Hypothyroid.  3. Anxiety.  4. Osteoarthritis of the hips, left greater than right.  5. Mild diverticulosis of the sigmoid colon.  6. History of atrial fibrillation and congestive heart failure in 2017 related to the bone marrow transplant.  7. Right cataract surgery.  8. Right rotator cuff surgery.    MEDICATIONS:     Current Outpatient Medications   Medication Sig   . metoprolol tartrate (LOPRESSOR) 50 mg tablet Take 1 tablet (50 mg total) by mouth 2 times daily   . DULoxetine (CYMBALTA) 30 mg DR capsule Take 1 capsule (30 mg total) by mouth daily   . nystatin (MYCOSTATIN) 100000 UNIT/GM cream Apply topically 2 times daily  to the following areas: apply to the rash under the right breast   . PREVIDENT 5000 DRY MOUTH 1.1 % dental gel SMARTSIG:To Teeth   . atorvastatin (LIPITOR) 40 mg tablet Take 1 tablet (40 mg total) by mouth nightly   . ondansetron (ZOFRAN) 4 mg tablet Take 1 tablet (4 mg total) by mouth 3 times daily as needed (nausea)   . aspirin 81 mg EC tablet Take 1 tablet (81 mg total) by mouth daily   . acyclovir (ZOVIRAX) 400 mg tablet Take 1 tablet (400 mg total) by mouth 2 times daily   . levothyroxine (SYNTHROID, LEVOTHROID) 50 MCG tablet Take 1 tablet (50 mcg total) by mouth daily (before breakfast)   . escitalopram (LEXAPRO) 20 MG tablet  Take 1 tablet (20 mg total) by mouth daily       ROS: As outlined above and in the nursing assessment. Comprehensive review of systems is otherwise negative.          Vitals:    09/14/21 0945   BP: 114/75   Pulse: 59   Temp: 35.6 C (96.1 F)   TempSrc: Temporal   SpO2: 96%   Weight: 112.4 kg (247 lb 12.8 oz)         PHYSICAL EXAMINATION:     General: Reveals a well-appearing woman. ECOG performance status is 1.  Weight is down 0.4 kg from last visit. HEENT exam: grossly unremarkable.  Sclerae are anicteric. Conjunctivae are pink.  Oral mucosa is without stomatitis or thrush. Lymph nodes: There is no palpable cervical, supraclavicular, axillary or inguinal lymph node enlargement.  Lungs: clear to auscultation and percussion.  Cardiac exam:  reveals a regular rate and rhythm. No murmur is noted.  Chest: PowerPort is present in the upper chest on the right.  Wounds are healing.  Breasts: Left breast has a palpable mass in the upper outer quadrant measuring approximately 2.5 cm.  This appears to be smaller than on prior exams.  Overlying skin is unremarkable.  Abdomen: is soft.  Bowel sounds are normoactive.  There is no hepatosplenomegaly, mass or ascites detected by palpation and percussion..  Extremities: are without edema.  Neurologic exam: is grossly nonfocal. Skin: is without rash, petechiae or ecchymoses.  Again noted are patches of vitiligo in the upper abdomen.    LABORATORY DATA:     Recent Results (from the past 672 hour(s))   Multiple ordering docs    Collection Time: 08/25/21  8:21 AM   Result Value Ref Range    Mult Providers see below    Vitamin B12    Collection Time: 08/25/21  8:49 AM   Result Value Ref Range    Vitamin B12 258 232 - 1,245 pg/mL   TSH    Collection Time: 08/25/21  8:49 AM   Result Value Ref Range    TSH 2.51 0.27 - 4.20 uIU/mL   Hemoglobin A1c    Collection Time: 08/25/21  8:49 AM   Result Value Ref Range    Hemoglobin A1C 5.5 %   Lactate dehydrogenase    Collection Time: 08/25/21  8:49  AM   Result Value Ref Range    LD 163 118 - 225 U/L   Comprehensive metabolic panel    Collection Time: 08/25/21  8:49 AM   Result Value Ref Range    Sodium 138 133 - 145 mmol/L    Potassium 4.8 3.3 - 5.1 mmol/L    Chloride 101 96 - 108 mmol/L    CO2 23 20 - 28 mmol/L    Anion Gap 14 7 - 16    UN 18 6 - 20 mg/dL    Creatinine 1.61 0.96 - 0.95 mg/dL    eGFR BY CREAT 78 *    Glucose 118 (H) 60 - 99 mg/dL    Calcium 9.4 8.6 - 04.5 mg/dL    Total Protein 6.5 6.3 - 7.7 g/dL    Albumin 4.4 3.5 - 5.2 g/dL    Bilirubin,Total 0.5 0.0 - 1.2 mg/dL    AST 24 0 - 35 U/L    ALT 29 0 - 35 U/L    Alk Phos 120 (H) 35 - 105 U/L   CBC and differential    Collection Time: 08/25/21  8:49 AM   Result Value Ref Range    WBC 5.0 4.0 - 10.0 THOU/uL    RBC 4.2 3.9 - 5.2 MIL/uL    Hemoglobin 11.8 11.2 - 15.7 g/dL    Hematocrit 38 34 - 45 %    MCV 92 79 - 95 fL    MCH 28 26 - 32 pg    MCHC 31 (L) 32 - 36 g/dL    RDW 40.9 (H) 81.1 - 14.4 %    Platelets 237 160 - 370 THOU/uL    Seg Neut % 54.9 %    Lymphocyte % 30.5 %    Monocyte % 10.4 %    Eosinophil % 3.2 %    Basophil % 0.8 %    Neut # K/uL 2.7 1.6 - 6.1 THOU/uL    Lymph # K/uL 1.5 1.2 -  3.7 THOU/uL    Mono # K/uL 0.5 0.2 - 0.9 THOU/uL    Eos # K/uL 0.2 0.0 - 0.4 THOU/uL    Baso # K/uL 0.0 0.0 - 0.1 THOU/uL    Nucl RBC % 0.0 0.0 - 0.2 /100 WBC    Nucl RBC # K/uL 0.0 0.0 - 0.0 THOU/uL    IMM Granulocytes # 0.0 0.0 - 0.0 THOU/uL    IMM Granulocytes 0.2 %   Neutrophil #-Instrument    Collection Time: 08/25/21  8:49 AM   Result Value Ref Range    Neutrophil #-Instrument 2.7 THOU/uL             Lab results: 08/25/21  0849 06/12/21  1144 02/06/21  0931 01/04/21  0900 12/08/20  1242 11/24/20  0941   WBC 5.0 5.5 5.7 5.6 5.8 0.6*   Neut # K/uL 2.7 3.2 3.5 3.8 3.7 0.1*   Lymph # K/uL 1.5 1.6 1.5 0.9* 0.9* 0.5*   Hematocrit 38 37 35 31* 28* 28*   Hemoglobin 11.8 11.6 10.6* 9.4* 8.7* 8.7*   MCV 92 90 98* 104* 106* 104*   Platelets 237 270 265 271 320 145*           Lab results: 08/25/21  0849  06/12/21  1144 02/06/21  0931 01/04/21  0900 12/08/20  1242   AST 24 25 26 19  35   ALT 29 24 32 21 35   Alk Phos 120* 117* 98 93 82   Bilirubin,Total 0.5 0.5 0.5 0.5 0.4         IMAGING DATA:     ASSESSMENT: Ms. Leidig has continued to tolerate chemotherapy well.  She has noted some mild numbness in the first toe each foot on occasion.  This is not a contraindication to continuing with treatment but we will need to monitor closely for further progression of symptoms.  Her breast tumor appears to be responding to the chemotherapy.  I have recommended that she continue with treatment.  She is agreeable and will start cycle 1 of doxorubicin and doxorubicin today.  She will return for follow-up in 3 weeks anticipation of starting cycle 2 of planned 4 cycles.    She continues to smoke cigarettes.  I have recommended that she try if possible to stop smoking.     PLAN:     Clinical stage IIB triple negative left breast cancer    Continue surveillance.    Mammogram today.    Schedule Mediport flush (due now). Should be done every 2-3 months.     Consider removing Mediport since no further treatment planned at this time.    Follow up in May/June 2024 when she returns from [place]     COVID-19     Patient has received [employer] COVID-19 vaccine, receiving the first dose on May 15, 2019, second dose on Jun 05, 2019 and booster dose on October 08, 2019.     She is eligible for a second booster dose.    I have recommend that she receive this 14 to 18 days after the chemotherapy treatment.     Patient advised to continue to take precautions to reduce risk of contracting COVID-19, including social distancing, frequent handwashing and using a mask when in public.    Health maintenance     Patient encouraged to stop smoking cigarettes entirely.  I have encouraged her to schedule an appointment with the smoking cessation clinic.     Recommend patient receive flu vaccine in  the next few months.   I have recommend that she  receive this 14 to 18 days after the chemotherapy treatment.       Follow-up     Return for follow-up in 3 weeks.    Due to concerns regarding COVID-19, a facemask was worn by myself as well as the patient throughout the office visit, except lately to allow for inspection of the oral cavity.      Oris Drone Griffith Citron, MD    ZO:[id number] Sol Blazing, MD  Sheliah Mends, MD  Joeseph Amor, MD

## 2021-09-14 NOTE — Progress Notes (Signed)
ECOG Performance 1    Patient scheduled for hip replacement at [facility] on 10/20/21.  Will require clearance from oncology.    ROS:  Appetite normal yes  Weight change yes - up 4.8 kg.  Increase in wt d/t immobility  Fever   no  Night sweats  Yes - Occasional and non-drenching.    Fatigue  yes - moderate  Bleeding   no  Cough    no  Dyspnea  no  Chest pain  no  Palpitations  no  Nausea  no  Vomiting  no  Dysphagia  no  Mucositis  no  Diarrhea  no  Constipation  no  Abdominal pain no  Urinary symptoms no  Back pain  no  Joint pain  yes - (L) hip.  Currently rating a "9" on a scale of 0-10 when getting up from sitting position or moving.    Bone pain  yes - (L) femer  Leg/arm swelling no  Headache  no  Mental status change no  Motor weakness yes - (L) leg weakness  Sensory change yes - Numbness, tingling, & burning in toes bilaterally since receiving chemotherapy  Gait disturbance yes - cane ow motorized wheelchair for mobility.    Rash    yes - beneath (R) breast.  Nystatin topically which is helping.      LMP:  MMG:  GYN:  DEXA:  COLONOSCOPY:

## 2021-09-14 NOTE — Patient Instructions (Addendum)
Continue surveillance.    Mammogram today.    Schedule Mediport flush (due now). Should be done every 2-3 months.     Consider removing Mediport since no further treatment planned at this time.    Follow up in May/June 2024 when you return from [place]           September 2023      Sunday Monday Tuesday Wednesday Thursday Friday Saturday                            1     2       3     4     5     6     7    FOLLOW UP VISIT   9:30 AM   (30 min.)   ,  D, MD   [facility]    RIS BI MG DIAGNOSTIC 30  11:00 AM   (30 min.)   RIS, RC BI4 (RCMM4)   [facility] 8     9       10     11     12     13     14     15     16       17     18     19     20     21     22     23       24     25     26     27     28     29     06 November 2021      Sunday Monday Tuesday Wednesday Thursday Friday Saturday   1     2     3     4     5    FOLLOW UP VISIT   8:00 AM   (10 min.)   Kaplan, Nathan, MD   [facility] [place] 6     7       8     9     10     11     12     13    ARTHROPLASTY, HIP, TOTAL   2:15 PM   Kaplan, Nathan, MD   [facility] 14       15     16     17     18     19     20     21       22     23     24     25     26     27     28       29     30     31                                      November 2023      Sunday Monday Tuesday Wednesday Thursday Friday Saturday   1     2     3     4       5     6     7     8     9     10     FOLLOW UP VISIT   2:20 PM   (20 min.)   Elsworth Soho, NP   Department of Orthopaedics 11       12     13     14     15     16     INJECTION  10:00 AM   (15 min.)   CAN CTR, INJECTION POD   [facility]    FOLLOW UP VISIT  11:00 AM   (30 min.)   Joeseph Amor, MD   [facility] 17     18       19     20     21     22     23     24     25       26     27     28     29     06 January 2022      Sunday Monday Tuesday  Wednesday Thursday Friday Saturday                            1     2       3     4     5     6     7     8     9       10     11     12     13     14     15     16       17     18     19     20     21     22     23       24     25     26     27     28     29     30       07 February 2022      Sunday Monday Tuesday Wednesday Thursday Friday Saturday        1     2     3     4     5     6       7     8     9     10     11     12     13       14     15     16     17   18     19     20       21     22     23     24     25     26     27       28     29     30     31                                 February 2024      Sunday Monday Tuesday Wednesday Thursday Friday Saturday                       1     2     3       4     5     6     7     8     9     10       11     12     13     14     15     16     17       18     19     20     21     22     23     24       25     26     27     28     06 April 2022      Sunday Monday Tuesday Wednesday Thursday Friday Saturday                            1     2       3     4     5     6     7     8     9       10     11     12     13     14     15     16       17     18     19     20     21     22     23       24     25     26     27     28     29     30       31

## 2021-09-20 ENCOUNTER — Ambulatory Visit: Payer: Medicare Other

## 2021-09-20 ENCOUNTER — Other Ambulatory Visit: Payer: Self-pay

## 2021-09-20 VITALS — BP 122/70 | HR 101 | Temp 97.0°F

## 2021-09-20 DIAGNOSIS — D849 Immunodeficiency, unspecified: Secondary | ICD-10-CM

## 2021-09-20 DIAGNOSIS — D471 Chronic myeloproliferative disease: Secondary | ICD-10-CM | POA: Insufficient documentation

## 2021-09-20 DIAGNOSIS — C50412 Malignant neoplasm of upper-outer quadrant of left female breast: Secondary | ICD-10-CM | POA: Insufficient documentation

## 2021-09-20 DIAGNOSIS — Z171 Estrogen receptor negative status [ER-]: Secondary | ICD-10-CM | POA: Insufficient documentation

## 2021-09-20 DIAGNOSIS — F17210 Nicotine dependence, cigarettes, uncomplicated: Secondary | ICD-10-CM | POA: Insufficient documentation

## 2021-09-20 DIAGNOSIS — Z9481 Bone marrow transplant status: Secondary | ICD-10-CM

## 2021-09-20 DIAGNOSIS — Z452 Encounter for adjustment and management of vascular access device: Secondary | ICD-10-CM | POA: Insufficient documentation

## 2021-09-20 MED ORDER — HEPARIN LOCK FLUSH 10 UNIT/ML IJ SOLN WRAPPED *I*
50.0000 [IU] | INTRAVENOUS | Status: DC | PRN
Start: 2021-09-20 — End: 2021-09-20
  Administered 2021-09-20: 50 [IU]

## 2021-09-20 NOTE — Progress Notes (Signed)
Patient arrived for scheduled port flush appointment. Port accessed easily with 19g 3/4" needle. Brisk blood return noted. Port flushed with normal saline and heparin prior to de-accessing. Patient discharged home, return appointment set, patient aware.

## 2021-09-20 NOTE — Progress Notes (Deleted)
Patient presents for port flush.     Port accessed without difficulty. Flushes easily and blood return noted.  Port deaccessed without issues and flushed with Heparin per policy. Gauze dressing placed over site.  Patient discharged to clinic in stable condition.

## 2021-09-21 ENCOUNTER — Other Ambulatory Visit: Payer: Self-pay | Admitting: Hematology

## 2021-09-21 ENCOUNTER — Other Ambulatory Visit: Payer: Self-pay | Admitting: Internal Medicine

## 2021-09-27 ENCOUNTER — Telehealth: Payer: Self-pay

## 2021-09-27 ENCOUNTER — Telehealth: Payer: Self-pay | Admitting: Hematology and Oncology

## 2021-09-27 MED ORDER — SEMAGLUTIDE (0.25 OR 0.5MG/DOSE) 2 MG/3ML SC SOPN *I*
PEN_INJECTOR | SUBCUTANEOUS | 6 refills | Status: DC
Start: 2021-09-27 — End: 2021-10-07

## 2021-09-27 NOTE — Telephone Encounter (Signed)
Patient is having hip surgery on 10/13 with Dr. Arlyce Dice, just wanted to remind Dr. Griffith Citron that the clearance needed to be sent to Dr. Arlyce Dice.

## 2021-09-27 NOTE — Telephone Encounter (Signed)
Pt was on Mounjaro- then ins would not cover. Started taking Metformin BI. Started having stomach pain/cramps.  Says meds are not working.    Asking what is next step?  Mentioned maybe taking Ozempic?

## 2021-09-27 NOTE — Telephone Encounter (Signed)
Done

## 2021-09-28 ENCOUNTER — Other Ambulatory Visit: Payer: Self-pay | Admitting: Internal Medicine

## 2021-09-28 NOTE — Telephone Encounter (Signed)
Pt no longer taking lexapro-she is taking cymbalta

## 2021-09-28 NOTE — Telephone Encounter (Signed)
Is she on both ?  Duloxetine and Escitalopram   If so at these doses ?  [facility]

## 2021-10-06 ENCOUNTER — Telehealth: Payer: Self-pay

## 2021-10-06 DIAGNOSIS — E119 Type 2 diabetes mellitus without complications: Secondary | ICD-10-CM

## 2021-10-06 NOTE — Telephone Encounter (Signed)
[facility] [place] - Comprehensive Medication Management Note    Carly Higgins is a 61 y.o. female who presents for follow-up visit with the clinical pharmacist. Method of visit: Telephone. Patient was referred by PCP for comprehensive medication management in the setting of newly diagnosed diabetes .  Patient's medications, preferred pharmacy and allergies were updated and marked as reviewed, as appropriate.        Ahmiah called stated that she has moderate-severe nausea from Ozempic.  She has not vomited but has come close.  She is interested in exploring other options, if there are any.           There were no vitals filed for this visit.     Additional labs/vitals as documented in chart - reviewed all relevant objective info at time of visit.    Current Outpatient Medications   Medication Sig Dispense Refill    semaglutide 0.25 mg or 0.5 mg dose (OZEMPIC) pen Inject 0.38 mLs (0.25 mg total) into the skin once a week for 28 days, THEN 0.75 mLs (0.5 mg total) once a week for 28 days. 3 mL 6    levothyroxine (SYNTHROID, LEVOTHROID) 50 mcg tablet TAKE 1 TABLET BY MOUTH EVERY DAY 90 tablet 5    atorvastatin (LIPITOR) 40 mg tablet TAKE 1 TABLET BY MOUTH IN THE EVENING 90 tablet 5    acyclovir (ZOVIRAX) 400 mg tablet TAKE 1 TABLET(400 MG) BY MOUTH TWICE DAILY 180 tablet 3    metoprolol tartrate (LOPRESSOR) 50 mg tablet Take 1 tablet (50 mg total) by mouth 2 times daily 180 tablet 5    DULoxetine (CYMBALTA) 30 mg DR capsule Take 1 capsule (30 mg total) by mouth daily 30 capsule 5    nystatin (MYCOSTATIN) 100000 UNIT/GM cream Apply topically 2 times daily  to the following areas: apply to the rash under the right breast 30 g 1    PREVIDENT 5000 DRY MOUTH 1.1 % dental gel SMARTSIG:To Teeth      ondansetron (ZOFRAN) 4 mg tablet Take 1 tablet (4 mg total) by mouth 3 times daily as needed (nausea) 30 tablet 3    aspirin 81 mg EC tablet Take 1 tablet (81 mg total) by mouth daily       No current  facility-administered medications for this visit.         Assessment and Plan:  With regards to medication management for this patient, the following aspects were reviewed: Indication, effectiveness, safety and convenience of each medication. The patient's medical conditions and medications were assessed, evaluated, and deemed meeting goals of drug therapy except as listed below:     1. T2DM:  We discussed that step therapy with (2) GLP-1s is required before insurance will consider covering Mounjaro.  After some discussion, she would like to try Trulicity instead since she cannot tolerate Ozempic.    Consider switching to Trulicity 0.75mg  once weekly; rx pended if you are in agreement with this plan     4. Other Medication-Related Interventions: In addition to the medication changes noted above, these medication-related interventions were completed by the Pharmacist during today's visit:  modified medication regimen to address medication related side effect    This report and pended medication orders were sent to Dr. Sol Blazing for review and final approval.     Patient was able to verbalize understanding and repeat back key educational points.     Follow-up: Pharmacist will follow-up with patient via phone in 3-4 weeks to follow-up on tolerability of  Trulicity.     Patient Status: Open to [facility]     Duration of Visit: 1-15 min    Thank you for allowing me to participate in the care of this patient. Please contact me with any questions.     Acie Fredrickson, PharmD, [facility]  [facility] [place], [place]   [phone]

## 2021-10-07 MED ORDER — DULAGLUTIDE 0.75 MG/0.5ML SC SOAJ *I*
0.7500 mg | SUBCUTANEOUS | 2 refills | Status: DC
Start: 2021-10-07 — End: 2021-11-27

## 2021-10-07 NOTE — Telephone Encounter (Signed)
Seen and agree  Script sent   Agusta Hackenberg Bonne Dolores, MD

## 2021-10-09 ENCOUNTER — Telehealth: Payer: Self-pay | Admitting: Hematology and Oncology

## 2021-10-09 ENCOUNTER — Other Ambulatory Visit: Payer: Self-pay

## 2021-10-09 DIAGNOSIS — Z01818 Encounter for other preprocedural examination: Secondary | ICD-10-CM

## 2021-10-09 NOTE — Telephone Encounter (Signed)
Pt is calling to speak to nurse to get update to see if Dr.Yirinec has sent over her release yet for her upcoming hip surgery on 10/13.

## 2021-10-09 NOTE — Discharge Instructions (Addendum)
Pre-Operative Instructions - Total Joint Replacement                FOLLOW YOUR SURGEON'S INSTRUCTIONS IF DIFFERENT THAN BELOW.                     FIVE DAYS PRIOR TO SURGERY  Stop taking any non-steroidal anti-inflammatory agents (such as Ibuprofen, Advil, Motrin, Aleve, or Naproxen) for five days prior to surgery (if directed differently in medication chart on previous page please follow those instructions).    THREE NIGHTS BEFORE SURGERY FOR INFECTION CONTROL  Put clean bedsheets on the first evening only, you do not need to change every night.  We ask you to shower for 3 evenings before your surgery using the 4% Chlorhexidine scrubs the nurse has given you.  Each night take a shower using antibacterial soap (such as Dial) and your normal shampoo/conditioner.  Afterward, while still in the shower, use 4% Chlorhexidine scrub and wash your body from the neck down. DO NOT wash your head, face, eyes and ears with the Chlorhexidine soap. This soap does not lather. Let the soap sit on your skin for 2 minutes. Rinse thoroughly.   Dry off with a clean, fresh towel each evening.  Do not apply lotion after your showers. Deodorant is okay to use. Do not shave below your waist for one week prior to surgery.  After showering, dress in freshly laundered clothes each evening.   If you experience itching or redness on application, wash it off and do not use it again. Continue with antibacterial soap (such as Dial).  If showering on the morning of surgery follow the same instructions as above: use antibacterial soap followed by Chlorhexidine scrub.       ARRIVAL/SURGICAL TIME  Staff from the [facility] will call you between 1:30 PM and 4 PM on the day before surgery to inform you of your arrival and surgery times. The call will be on Friday if your surgery is on Monday.      DAY BEFORE SURGERY  Keep yourself well hydrated to aid in the placement of your IV and for your general well-being.  Do not eat anything after  midnight the night before your surgery (including candy or gum).                                                                                           DAY OF SURGERY  DO NOT CONSUME FOOD OF ANY KIND. Only Gatorade, clear apple juice or water from midnight until 2 hours before your scheduled surgery.    Valuables such as jewelry, cash and credit cards are best left at home during your stay.  Please send them home with family members.  If you are alone a staff member will assist you in storing your belongings.    Before coming to the hospital, remove all makeup (including mascara), jewelry (including wedding band and watch), hair accessories and nail polish from toes and fingers.  Do not bring any valuables (money, wallet, purse, jewelry, or contact lenses.)    We ask that your cell phone be with you   and turned on, so that the Preop and Operating Room nurses may phone you directly with instructions on the morning of surgery if necessary.    If wearing eyeglasses, please bring a case.  DO NOT WEAR CONTACT LENSES.    PLEASE BRING YOUR CPAP MASK and TUBING INTO THE PREOPERATIVE AREA.    Patient visitation is restricted at this time due to COVID-19 concerns.       MEDICATIONS: DAY OF SURGERY  Take your medications as directed according to your printed, verbal or MyChart instructions.    Please leave your prescriptions at home with the exception of your inhalers.    AT THE HOSPITAL ON THE DAY OF SURGERY  Park in the Main Ramp garage.  Enter the building through the Main Lobby.     Please stop at the Information Desk for directions to the Surgery Center on [address].    Leave your belongings in the car (except for your CPAP MASK and TUBING.) Your visitors may bring them to your room after surgery.    [facility]  As a convenience, prior to your discharge we will fill any discharge medications you require.      The pharmacy is open from 9am to 5:30pm on weekdays and 10am-2pm on Saturdays.     If  you will be alone on the day of surgery and want to leave with your prescribed medication, you may:  Bring a check made out to [facility]  Use a credit card to pay for your prescription  Have your family call the pharmacy with a credit card number  Only bring cash to the hospital as a last resort. Prescriptions cannot be filled without payment. Thank you for your consideration.    QUESTIONS?  Question about these instructions? Call [phone], between the hours of 8 AM- 4 PM Monday through Friday.   Any questions regarding specifics about your surgery or recovery? Please call your surgeon's office.     `    Here at [facility] we are committed to providing you Medicine of the Highest Order. You have been scheduled to undergo surgery through [facility] in the upcoming days or weeks. In ensuring that everything is ready before surgery - it was determined that you have a condition called anemia. Anemia is the term doctors and nurses use when a person has too few red blood cells. Red blood cells are the cells in your blood that carry oxygen. If you have too few red blood cells, your body does not get all the oxygen it needs.    Anemia can happen for a few reasons. The most common reason is a lack of iron. This is called iron deficiency anemia. Anemia can happen for other reasons too, such as a lack of a specific vitamin in your diet. Anemia can even happen if you have a kidney problem or other chronic medical problems. To help us figure out why you have anemia, we have drawn additional blood work during your [facility].    Anemia is an important condition to address prior to having surgery so that we can make every effort to reduce your risk of needing a blood transfusion during or after surgery. Blood transfusions, while lifesaving, should be considered a serious intervention. If your red blood cells level has increased before coming to surgery, you are less likely to  need a blood transfusion while you are in the hospital.    In this packet,   you will be provided instructions on a high iron diet. You may also receive a prescription for a vitamin called folate or an oral iron supplement over the counter. It is important for you to take this vitamin as directed. After we review your blood work results, we will reach out to you with further instructions. We have provided you with information on anemia and information on treating the kind of anemia called "iron deficiency anemia". We do not know if you have "iron deficiency anemia" yet. Once your blood work results are reviewed, we will contact you with other instructions.    If you are found to have "iron deficiency anemia", we will be calling you so that we can schedule you for one or two separate iron infusions. An iron infusion is an intravenous iron replacement medication called Feraheme that is shown to rapidly increase your body's iron, which is an important element of red blood cells. With more iron, your body can make more red blood cells. These infusions will be performed at a [facility]. Since you are scheduled for surgery, we are making every effort to treat your anemia quickly so as not to delay your surgery. Therefore, you will be scheduled for intravenous iron replacement. People can increase iron by eating more iron-rich foods or by taking iron pills. The iron infusions will need to occur at one or two separate times - separated by approximately 1 week. The appointments will generally last one hour. There is further information about the medication included in your handout. You will not be charged for the infusion at the time of your visit; however, a bill may be mailed to you depending on your insurance. If you have further questions about your financial responsibility, please call our [facility] Price Analyst Team at [phone] and give them the medication code: Q0138.    If you have any further  questions about anemia or your management prior to your surgery, please feel free to contact us at the [facility]. The phone number is [phone], option 1.    We thank you for allowing us to participate in your pre-surgical care.    Sincerely,    [facility]      Patient Information-Medications in Blood Management     Your doctor may prescribe several medications and vitamins to increase the number of red blood cells in your body. These treatments will reduce or eliminate the need for a blood transfusion. Some of the medications are:   Erythropoietin (EPO)  Erythropoietin is a man-made substance that is normally created naturally in the body. It helps the bone marrow to make red blood cells. If the body does not make enough of this substance, severe anemia (a low number of red blood cells in the body) can occur. EPO is administered through injection as prescribed.   Iron Preparations  Iron is needed to make red blood cells. Iron levels can be increased through diet or by medicine taken either by mouth or through infusions.   When taking iron by mouth, please be aware that certain things may lower the body's ability to absorb iron, including: Vitamin E, eggs, milk, dairy products, antacids. When taking iron pills:   Take with orange juice or any citrus juice.  Take vitamin C every day to help the iron absorb  Take iron pills 1 hour before meals or 2 hours after meals. If you suffer from stomach problems, take with meals.   Folic Acid    Folic acid is needed for red blood cell production. It is found in bran, yeast, dried beans, fruits, nuts, and fresh vegetables. Alcohol, birth control pills, and certain medications may increase the body's need for folic acid.   Vitamin B12  This vitamin is necessary to the body to make red blood cells and for cell reproduction. If taking by mouth, use fruit or your meal to cover up the taste. By taking with meals will help with  absorption. Vitamin B12 is also found in egg yolks, fish, organ meats, dairy products, clams, and oysters. It also can be given by injection.   Vitamin C  Vitamin C helps iron get into the blood stream and is necessary for wound healing. Do not exceed the prescription dose. Vitamin C can also be found in citrus fruits and vegetables. Smoking decreases Vitamin C levels.       Intravenous Iron Supplementation     What is iron?  Iron is one of the minerals in the human body. It Is one of the components of hemoglobin, the substances in red blood cells that helps blood carry oxygen throughout the body.    If you do not have enough iron, your body cannot make hemoglobin, and you may develop anemia. This is known as iron-deficiency anemia, the most common type of anemia.     Factors that can lower your body's supply of iron include:  Blood loss (caused by ulcers, some cancers, and other conditions; and, in women, during monthly periods.)  A diet that doesn't have enough iron in it.   An increase in the body's need for iron (for instance, in women during pregnancy)   What are the symptoms of anemia?  There are several symptoms that may occur in all types of anemia. They are:  Feeling tired  Paleness  Difficulty breathing  Fast heartbeat  Dizziness  Headache  Feeling cold (including the sensation that your hands and feet are colder than usual)   Infections (cause by problems with the immune system)               Iron-Deficiency Anemia   Who is most likely to develop iron-deficiency anemia?  Anyone can develop iron-deficiency anemia, although the following groups have a higher risk:  Women: Blood loss during monthly periods and childbirth can lead to anemia.   People over 65, who are on blood thinners such as aspirin, Plavix, Coumadin, or heparin.   People who have kidney failure (especially if they on are dialysis), because they have trouble making red blood cells  People who have trouble absorbing iron   How is anemia  diagnosed?  Your health care provider can perform blood tests to tell you if you have anemia. The type and number of blood tests will depend on what type of anemia your doctor thinks you have.   The blood tests will measure your hemoglobin and how much iron is in your body. If these levels are low, the doctor can make a diagnosis of anemia.   How is anemia treated?  Your health care provider will decide on the proper treatment, depending on the type of anemia and what is causing it.   Your doctor must first find out if the anemia is being caused by poor diet or a more serious health problem. You can then be treated for both the anemia and its cause.   One way of treating iron-deficiency anemia is by eating foods that are high in iron. The following foods   are good sources of iron:  Kidney beans  Tofu  Beef (chuck roast, lean ground beef)  Turkey leg  Whole wheat bread  Tuna  Peanut butter  Leg of lamb  Brown rice  Raisin bran (enriched)   Another way to treat anemia is by taking oral (by mouth) iron supplements (pills). The patient may also need to take erythropoietin-stimulating agents (ESAs). ESAs work by helping to make more red blood cells. These cells then released from the bone marrow into the bloodstream. ESAs are given by injection (shot) or intravenously.   In cases where the patient cannot take oral iron supplements, he or she may have to have intravenous iron supplementation. As with any medication do not take any supplements without the advice and direction of your physician.   What is intravenous iron supplementation?   Intravenous (IV) iron supplementation is a method of delivering iron by injection (shot) with a needle, either through a muscle or into a vein. (Medication that is given through an injection or intravenously is called parenteral therapy.)   Who receives intravenous iron supplementations?   Patients who receive IV iron usually do so because they cannot take oral iron. These include the  following:    Patients who are bleeding in the gastrointestinal (GI) tract (the gut) and need to replace iron quickly (IV iron is absorbed by the body more rapidly than oral iron);   Patients who have inflammatory bowl disease (diseases of the intestines that cause pain, diarrhea, and weight loss, and cannot take oral iron because it upsets their GI tract;  Patients who are on kidney dialysis, who often lose blood during dialysis. In addition, these patients are usually taking an ESA and may need extra iron.   Cancer patients who have anemia and are taking ESA.     How is intravenous iron given?  Intravenous iron is delivered into the patients vein through a needle. The procedure takes place in a doctor's office or a clinic and may take up to several hours, depending on which treatment the physician has prescribed. The patient usually receives iron injections over the course of several visits until his or her iron levels are correct.   What are the side effects of intravenous iron?  The side effects of IV iron are usually minimal. But may include the following:  Gastrointestinal pains, including nausea and cramps  Problems with breathing  Skin problems, including rash  Chest pain  Low blood pressure   Anaphylaxis (a severe reaction that can include difficulty breathing, itching, or a rash over the entire body)    How effective is intravenous iron?  When you should start to feel better depends on your particular situation. Normally, it may take from a week to a month after you start your iron supplement before you start to feel better. Continue to watch your symptoms and take note of side effects that might be caused by the supplements. If you have any questions or concerns, talk to your health care provider.       Feraheme: What to Expect at Your Infusion     You will be receiving your treatment at [facility] at [address].  You can enter at the  main entrance on the first floor.  It is best to have someone accompany you to the infusion center and drive you to the appointment if possible.       About the Infusion Center:   Our   Infusion Center includes 2 comfortable recliner chairs. You are welcome to bring food and drink from home, there is water available.  We do ask that you refrain from bringing anything with strong odors as many patients receiving treatments can be sensitive to these. In addition, it is a fragrance free office, so please, no perfume or strong smelling lotions, etc.       About Your Infusion:  Feraheme is an iron-replacement product used to treat iron deficiency. It is given intravenously (into the vein), typically in two doses with a week in between each treatment. On the day of your infusion, plan to be at the Infusion Center for about an hour.     Each infusion takes about 15 minutes and requires a 30-minute waiting period afterward to monitor for any possible side effects. Entire appointment could be up to 90 minutes.  Prior to your infusion, eat and drink as you normally would and take your regular medications, including pain medicine.   A Nurse or Patient Care Technician will bring you back and obtain your vital signs, including height and weight.   The nurse caring for you will explain the side effects and administration of the Feraheme or a similar Iron product and will answer any questions or address any concerns that you may have prior to your treatment.    The nurse will then place an IV, a plastic catheter that is inserted temporarily into your vein so the medication can be given, and your Feraheme infusion will be started.   The infusion takes 15 minutes with a 30-minute wait time afterward to monitor for any possible side effects.    Once your wait time is done, your vital signs will be rechecked, and your nurse will discharge you home.   Rarely, patients receiving Feraheme can have an allergic-type reaction to the infusion.  The nurses caring for patients in the infusion center are very familiar with and competent in managing this side effect if it occurs.   A Nurse Practitioner or Physician Assistant is in the infusion center available at all times in case of any immediate need.   There is also a very Rare risk that the iron infusion could stain your skin (<1.5% risk )  Please notify the Infusion Center as soon as possible if you are unable to come in for your scheduled appointment. Please also feel free to contact us if you have additional questions or concerns regarding your infusion appointment. We can be reached at [phone].      Your medication is made 48 hours prior to your appointment and is specific for you. Because the medication cannot be given to someone else, missed infusion appointments result in the medication being wasted. This medication is very expensive. For these reasons, if an appointment is missed or rescheduled with less than 48 hours' notice, your future treatments with Feraheme will only be made for you AFTER you arrive in the Infusion center and will extend your wait time. Questions regarding your medical care should continue to be directed to your physician's office.      The staff at [facility] look forward to caring for you in collaboration with the other members of your treatment team.      You were seen today by  , FNP

## 2021-10-09 NOTE — Anesthesia Preprocedure Evaluation (Addendum)
Anesthesia Pre-operative History and Physical for Carly Higgins  History and Physical Performed at [facility]  Highlighted Issues for this Procedure:  61 y.o. female with Primary osteoarthritis of left hip [M16.12] presenting for Procedure(s) (LRB):  LEFT POSTERIOR ARTHROPLASTY, HIP, TOTAL (Left) by Surgeon(s):  Fransisca Connors, MD scheduled for 150 minutes.  BMI Readings from Last 1 Encounters:  10/12/21 : 41.00 kg/m          Stress Test/Echocardiography:  07/05/20 Echo Complete with Contrast:   Normal left ventricular size and systolic function with no significant regional wall motion abnormalities.   Calculated LVEF is 65%.   Normal right ventricular size and function.   Aortic and mitral valve sclerosis without stenosis.   Unable to estimate RV systolic pressure.   Borderline enlarged ascending aorta.   Compared to the prior echo (11/22/2015), the ascending aorta is now borderline enlarged. Aortic Diameters: sinus = 3.1 cm, tubular = 3.6 cm.    Electrophysiology/AICD/Pacer:  08/31/21 EKG: HR 83, sinus rhythm     11/22/15 Hosp Owned Holter 48 Hour (Split):  Sinus rhythm. No AV block. No pauses exceeding 2.0 seconds were noted. Rare supraventricular ectopic beats. One SVE run (rate less then 100 BPM). Two SVT runs (rates over 100 BPM). Rare ventricular ectopic beats were noted. No VT. During patient symptoms rates in the 70's and 16' were observed with no ectopy present.    Pulmonary Function Tests:  03/28/16 PFTs: FEV1 2.17, FVC 2.78, FEV1/FVC 78%, DLCO 15.91  Impression: no significant change compared to prior testing. Isolated reduction in the diffusing capacity, consistent with pulmonary vascular disease (including pulmonary edema) or interstitial lung disease (including emphysema). Clinical correlation is suggested. The reduced FEV1 and FVC and normal FEV1/FVC suggest a restrictive ventilatory deficit. However, this is not confirmed by lung volume measurement because TLC is normal. Serial tests may reveal  the presence of an ongoing restrictive process.     .  [facility] Summary:  Carly Higgins presents preoperatively for anesthesia evaluation prior to LEFT POSTERIOR ARTHROPLASTY, HIP, TOTAL (Left: Hip) on 10/20/21 with Dr. Arlyce Dice.     She has a medical history significant for:  Primary osteoarthritis of left hip   BMI 40.85  GERD - no current medications  Hypothyroidism - Synthroid, TSH 2.51 on 08/25/21  DM type II - A1c 5.5  HLD - atorvastatin  Clinical stage IIB left breast cancer, s/p partial mastectomy 01/24/21, completed chemo in 12/2020 and completed radiation 05/2021 (last seen by oncology on 09/14/21)  Myeloproliferative disease s/p bone marrow transplant 2017  Atrial fibrillation/flutter, heart failure - happened during hospitalization after bone marrow transplant in 2017, now in NSR, EF 65%, on ASA and metoprolol (does not follow with cards)  Chemo induced peripheral neuropathy  Former tobacco smoker, quit 07/2021    Anesthesia History:  01/24/21 LEFT NEEDLE LOC PARTIAL MASTECTOMY (Left: Breast) sentinel lymph node biopsy (Left: Axilla): ETT Type: Normal;  Site: Oral;  Size: 7; Cuffed;  Placement confirmed by:  ETCO2 Detector; Direct laryngoscopy; Mac blade size 3; Grade I - full view of glottis;  Number of Attempts at Approach:  1    The patient has no dyspnea, denies chest pain, is moderately active, climbs stairs, and can lie flat. Of note, patient has an active prescription for Dulaglutide but has not started it yet and is currently still using her Ozempic. Patient instructed to hold Ozempic for a full 7 days before surgery, last dose taken on 9/29 and she will skip her 10/6 dose.  Patient agreed with plan and verbalized understanding.     Patient's POCT Hgb at [facility] visit was 10.9, anemia labs ordered. Discussed with patient the possibility of needing iron supplementation before surgery depending on anemia panel and CBC. I have discussed possible hypersensitivity/anaphylaxis reactions and the management of them  with the patient. The patient has agreed to proceed with receiving parenteral iron infusions.   Specific risk discussed include:  - Common side effects with IV iron infusion including Headache, metallic taste in mouth, joint pain   - Risk of hypersensitivity reaction including temporary symptoms of nausea, facial flushing, skin itching, dizziness, myalgia, fever  - Rare risk of IV Iron staining skin   - Rare risk of Infusion reaction that could result in difficulty breathing, low blood pressure or anaphylaxis.    Addendum 10/12/21: patient's Hgb 12.6 on anemia panel, no iron infusions recommended at this time. K+ 5.5 - patient denies chest pain, palpitations, nausea, or dizziness. Called to discuss findings and maintaining a low K diet prior to surgery, labs sent to PCP and surgeon. Repeat K ordered for the DOS.     Perioperative Cardiac Risk:  0.8-1.6%By Adrienne Mocha, NP at 7:49 AM on 10/12/2021    Anesthesia Evaluation Information Source: records, patient     ANESTHESIA HISTORY     Denies anesthesia history    GENERAL    + Obesity    + Anesthesia monitoring restrictions          Left side Breast CA  Comment: s/p left partial mastectomy 01/24/21  Pertinent (-):  No infection    HEENT    + Visual Impairment (readers)          corrective lens for ADL  Pertinent (-):  No glaucoma, hearing loss, TMJD, sinus issues, nosebleeds or neck pain PULMONARY    + Smoker (quit July 2023)          tobacco quit recently    + Snoring  Pertinent(-):  No asthma, restrictive lung disease, shortness of breath, recent URI, cough/congestion, currently active environmental allergies, sleep apnea or COPD    CARDIOVASCULAR  Good(4+METs) Exercise Tolerance    + Anticoagulants          ASA    + Hx of Dysrhythmias (during hospitalization in 2017, currently in sinus rhythm)          atrial fib, VT  Pertinent(-):  No hypertension, past MI, angina, valvular heart disease, orthopnea, hx of DVT or hypotension    Comment: Activity limited due to hip  pain, states she would be able to climb 1 FOS without chest pain or SOB     GI/HEPATIC/RENAL       + GERD          well controlled    + Nausea (from ozempic use)          chronic  Pertinent(-):  No PUD, vomiting, bowel prep, renal issues, urinary issues or  esophageal issues  NEURO/PSYCH/ORTHO    + Neuropsychiatric Issues          depression    + Peripheral Nerve Issue          peripheral neuropathy    + Gait/Mobility issues          cane, walker  Pertinent(-):  No headaches, dizziness/motion sickness, syncope, seizures or cerebrovascular event    ENDO/OTHER    + Diabetes Mellitus (held Ozempic for 2 weeks)          Type 2 HgA1c: 5.5    +  Thyroid Disease          HYPOthyroid     + Chemo Hx (ended in 12/2020)  Pertinent(-):  No hormone use, steroid use    HEMATOLOGIC    + Anticoagulants/Antiplatelets          ASA    + Blood Transfusion (during hospitalization in 2017)    + Blood dyscrasia          hyperlipidemia    + Arthritis  Pertinent(-):  No bruising/bleeding easily, coagulopathy or autoimmune disease         Physical Exam    Airway            Mouth opening: normal            Mallampati: II            TM distance (fb): >3 FB            Neck ROM: full  Dental        Comment: No dentures or partials, no loose/cracked teeth   Cardiovascular  Normal Exam           Rhythm: regular           Rate: normal  Vascular Access            port  Right chest mediport    Neurologic      + Sensory deficit (numbness in toes bilat)     Pulmonary   Normal Exam    breath sounds clear to auscultation    No cough, decreased breath sounds, wheezes    Mental Status   Normal Exam    oriented to person, place and time     Patient Education from [facility] Provider:  Patient Education:  The following items were discussed with Carly Higgins to her satisfaction and comprehension:  The facility's NPO guidelines were discussed  To call the surgeon if she becomes ill prior to surgery  All questions were answered  Medications DOS with sip of water:  see AVS  Hold medications AM day of surgery: see AVS  Nurse reviewed additional items as indicated in the education record.  No barriers to learning identified.  Teaching sheet reviewed with patient/family.  IV insertion was reviewed with her.  DAANYA MONROE was instructed to hold NSAIDS 5 days before surgery.  Patient instructed to continue 81 mg ASA as prescribed.  Patient instructed to hold Ozempic for 7 days before surgery, last dose on 9/29, will skip 10/6 dose.  Chlohexidine scrub instructions reviewed.      ________________________________________________________________________  PLAN  ASA Score  3  Anesthetic Plan general      Protocols/Care Pathways (Total Joint Arthroplasty) Induction (routine IV) General Anesthesia/Sedation Maintenance Plan (inhaled agents); Airway (LMA); Line ( use current access); Monitoring (standard ASA); Positioning (supine); PONV Plan (ondansetron and dexamethasone); Pain (per surgical team- Plain spinal); PostOp (PACU)Standard Attestation    Informed Consent     Risks:          Risks discussed were commensurate with the plan listed above with the following specific points: N/V, aspiration, sore throat, hypotension, headache and failed block, Damage to: nerves, eyes and teeth, unexpected serious injury and allergic Rx.    Anesthetic Consent:         Anesthetic plan (and risks as noted above) were discussed with patient and mother    Blood products Consent:        Use of blood products discussed with: patient and mother and they  consented    Plan also discussed with team members including:       attending    Responsible Anesthesia Provider Attestation:  I attest that the patient or proxy understands and accepts the risks and benefits of the anesthesia plan. I also attest that I have personally performed a pre-anesthetic examination and evaluation, and prescribed the anesthetic plan for this particular location within 48 hours prior to the anesthetic as documented. Duane Boston, DO   10/20/21, 10:28 AM

## 2021-10-09 NOTE — Progress Notes (Signed)
Pre-op smoking labs ordered

## 2021-10-11 ENCOUNTER — Other Ambulatory Visit: Payer: Self-pay

## 2021-10-11 ENCOUNTER — Ambulatory Visit
Admission: RE | Admit: 2021-10-11 | Discharge: 2021-10-11 | Disposition: A | Discharge status: Home / Self Care | Payer: Medicare Other | Source: Ambulatory Visit

## 2021-10-11 ENCOUNTER — Other Ambulatory Visit
Admission: RE | Admit: 2021-10-11 | Discharge: 2021-10-11 | Disposition: A | Discharge status: Home / Self Care | Payer: Medicare Other | Source: Ambulatory Visit

## 2021-10-11 ENCOUNTER — Ambulatory Visit
Admission: RE | Admit: 2021-10-11 | Discharge: 2021-10-11 | Disposition: A | Discharge status: Home / Self Care | Payer: Medicare Other | Source: Ambulatory Visit | Attending: Orthopedic Surgery | Admitting: Orthopedic Surgery

## 2021-10-11 VITALS — BP 108/63 | HR 78 | Temp 97.9°F | Resp 18 | Ht 66.0 in | Wt 253.1 lb

## 2021-10-11 DIAGNOSIS — M85852 Other specified disorders of bone density and structure, left thigh: Secondary | ICD-10-CM

## 2021-10-11 DIAGNOSIS — Z01818 Encounter for other preprocedural examination: Secondary | ICD-10-CM

## 2021-10-11 DIAGNOSIS — Z0189 Encounter for other specified special examinations: Secondary | ICD-10-CM | POA: Insufficient documentation

## 2021-10-11 DIAGNOSIS — M1612 Unilateral primary osteoarthritis, left hip: Secondary | ICD-10-CM

## 2021-10-11 LAB — COMPREHENSIVE METABOLIC PANEL
ALT: 30 U/L (ref 0–35)
AST: 25 U/L (ref 0–35)
Albumin: 4.7 g/dL (ref 3.5–5.2)
Alk Phos: 135 U/L — ABNORMAL HIGH (ref 35–105)
Anion Gap: 13 (ref 7–16)
Bilirubin,Total: 0.5 mg/dL (ref 0.0–1.2)
CO2: 25 mmol/L (ref 20–28)
Calcium: 10.3 mg/dL — ABNORMAL HIGH (ref 8.6–10.2)
Chloride: 101 mmol/L (ref 96–108)
Creatinine: 0.69 mg/dL (ref 0.51–0.95)
Glucose: 105 mg/dL — ABNORMAL HIGH (ref 60–99)
Lab: 21 mg/dL — ABNORMAL HIGH (ref 6–20)
Potassium: 5.5 mmol/L — ABNORMAL HIGH (ref 3.3–5.1)
Sodium: 139 mmol/L (ref 133–145)
Total Protein: 7 g/dL (ref 6.3–7.7)
eGFR BY CREAT: 98 *

## 2021-10-11 LAB — HEMOGLOBIN A1C: Hemoglobin A1C: 5.6 %

## 2021-10-11 LAB — MSSA/MRSA NAAT
MRSA nasal NAAT (PCR): NEGATIVE
MSSA Nasal NAAT (PCR): POSITIVE — AB

## 2021-10-11 LAB — CBC
Hematocrit: 40 % (ref 34–49)
Hemoglobin: 12.6 g/dL (ref 11.2–16.0)
MCH: 29 pg (ref 26–32)
MCHC: 32 g/dL (ref 32–36)
MCV: 93 fL (ref 75–100)
Platelets: 259 10*3/uL (ref 150–450)
RBC: 4.3 MIL/uL (ref 4.0–5.5)
RDW: 15.2 % — ABNORMAL HIGH (ref 0.0–15.0)
WBC: 6.6 10*3/uL (ref 3.5–11.0)

## 2021-10-11 LAB — PROTIME-INR
INR: 0.9 (ref 0.9–1.1)
Protime: 9.9 s — ABNORMAL LOW (ref 10.0–12.9)

## 2021-10-11 LAB — RETICULOCYTES
Retic %: 2 % (ref 0.7–2.1)
Retic Abs: 84.9 10*3/uL (ref 29.9–90.9)

## 2021-10-11 LAB — FERRITIN: Ferritin: 432 ng/mL — ABNORMAL HIGH (ref 10–120)

## 2021-10-11 LAB — VITAMIN B12: Vitamin B12: 347 pg/mL (ref 232–1245)

## 2021-10-11 LAB — TYPE AND SCREEN
ABO RH Blood Type: A POS
Antibody Screen: NEGATIVE

## 2021-10-11 LAB — POCT HGB: Hgb, POC: 10.9 g/dL — AB (ref 11.2–15.7)

## 2021-10-11 LAB — TIBC
Iron: 75 ug/dL (ref 34–165)
TIBC: 371 ug/dL (ref 250–450)
Transferrin Saturation: 20 % (ref 15–50)

## 2021-10-11 LAB — APTT: aPTT: 32.5 s (ref 25.8–37.9)

## 2021-10-11 LAB — FOLATE: Folate: >14 ng/mL (ref >=4.6)

## 2021-10-12 ENCOUNTER — Ambulatory Visit: Payer: No Typology Code available for payment source | Admitting: Orthopedic Surgery

## 2021-10-12 ENCOUNTER — Encounter: Payer: Self-pay | Admitting: Orthopedic Surgery

## 2021-10-12 ENCOUNTER — Other Ambulatory Visit
Admission: RE | Admit: 2021-10-12 | Discharge: 2021-10-12 | Disposition: A | Discharge status: Home / Self Care | Payer: Medicare Other | Source: Ambulatory Visit

## 2021-10-12 ENCOUNTER — Encounter: Payer: Self-pay | Admitting: Hematology and Oncology

## 2021-10-12 VITALS — BP 129/61 | HR 76 | Ht 66.0 in | Wt 254.0 lb

## 2021-10-12 DIAGNOSIS — Z01818 Encounter for other preprocedural examination: Secondary | ICD-10-CM | POA: Insufficient documentation

## 2021-10-12 DIAGNOSIS — C50412 Malignant neoplasm of upper-outer quadrant of left female breast: Secondary | ICD-10-CM | POA: Insufficient documentation

## 2021-10-12 DIAGNOSIS — M1612 Unilateral primary osteoarthritis, left hip: Secondary | ICD-10-CM | POA: Insufficient documentation

## 2021-10-12 DIAGNOSIS — Z171 Estrogen receptor negative status [ER-]: Secondary | ICD-10-CM | POA: Insufficient documentation

## 2021-10-12 DIAGNOSIS — D471 Chronic myeloproliferative disease: Secondary | ICD-10-CM | POA: Insufficient documentation

## 2021-10-12 LAB — COMPREHENSIVE METABOLIC PANEL
ALT: 27 U/L (ref 0–35)
AST: 23 U/L (ref 0–35)
Albumin: 4.6 g/dL (ref 3.5–5.2)
Alk Phos: 125 U/L — ABNORMAL HIGH (ref 35–105)
Anion Gap: 13 (ref 7–16)
Bilirubin,Total: 0.5 mg/dL (ref 0.0–1.2)
CO2: 25 mmol/L (ref 20–28)
Calcium: 9.9 mg/dL (ref 8.6–10.2)
Chloride: 101 mmol/L (ref 96–108)
Creatinine: 0.71 mg/dL (ref 0.51–0.95)
Glucose: 77 mg/dL (ref 60–99)
Lab: 18 mg/dL (ref 6–20)
Potassium: 4.8 mmol/L (ref 3.3–5.1)
Sodium: 139 mmol/L (ref 133–145)
Total Protein: 6.5 g/dL (ref 6.3–7.7)
eGFR BY CREAT: 96 *

## 2021-10-12 LAB — CBC AND DIFFERENTIAL
Baso # K/uL: 0 10*3/uL (ref 0.0–0.2)
Basophil %: 0.7 %
Eos # K/uL: 0.2 10*3/uL (ref 0.0–0.5)
Eosinophil %: 3.2 %
Hematocrit: 37 % (ref 34–49)
Hemoglobin: 12 g/dL (ref 11.2–16.0)
IMM Granulocytes #: 0 10*3/uL (ref 0.0–0.0)
IMM Granulocytes: 0.4 %
Lymph # K/uL: 1.7 10*3/uL (ref 1.0–5.0)
Lymphocyte %: 30.3 %
MCH: 30 pg (ref 26–32)
MCHC: 32 g/dL (ref 32–36)
MCV: 91 fL (ref 75–100)
Mono # K/uL: 0.5 10*3/uL (ref 0.1–1.0)
Monocyte %: 9 %
Neut # K/uL: 3.1 10*3/uL (ref 1.5–6.5)
Nucl RBC # K/uL: 0 10*3/uL (ref 0.0–0.0)
Nucl RBC %: 0 /100 WBC (ref 0.0–0.2)
Platelets: 236 10*3/uL (ref 150–450)
RBC: 4.1 MIL/uL (ref 4.0–5.5)
RDW: 15.2 % — ABNORMAL HIGH (ref 0.0–15.0)
Seg Neut %: 56.4 %
WBC: 5.5 10*3/uL (ref 3.5–11.0)

## 2021-10-12 LAB — CARBOXYHEMOGLOBIN: CO: 1.8 %

## 2021-10-12 LAB — NEUTROPHIL #-INSTRUMENT: Neutrophil #-Instrument: 3.1 10*3/uL

## 2021-10-12 LAB — MULTIPLE ORDERING DOCS

## 2021-10-12 LAB — LACTATE DEHYDROGENASE: LD: 165 U/L (ref 118–225)

## 2021-10-12 NOTE — Comprehensive Assessment (Signed)
10/12/21 1348   Contact Information   Spokesperson Name Okey Regal   Relationship to Patient  Parent   Is spokesperson the patient's caregiver after discharge? Yes   Discharge transportation   Relationship to Patient  Parent        10/12/21 1349   Demographics   County of Residence [place]   Ethnicity/Race Caucasian   Primary Language English   Primary Care Taker of? No one   Risk Factors   Risk Factors Adjustment to Dx/Injury/Illness   Living Situation   Lives With Alone  (will stay at her mother's house post surgery)   Can they assist patient after discharge? Yes   Home Geography   Type of [facility]   # Of Steps In Home 0   # Steps to Enter Home   (ramp)   Bedroom First floor   Bathroom First floor - full   Utilitites Working Yes   Palliative Care Assessment   Person assessed Patient   Coping Status Appropriate to Stressor   Current Goal of Care Curative   Baseline ADL functioning   Transfers Independent   Ambulation Independent   Assistive Device none  (uses an electric scooter to take her dog for a walk)   Bathing/Grooming Independent   Meal Prep Independent   Able to feed self? Yes   Household maintenance/chores Independent   Able to drive? Yes   Home Care Services   Current Home Equipment Walker (rolling);Cane (straight)   Time Spent   Time Spent with Patient (min) 10     Spoke with pt to review d/c plan after upcoming surgery, chart reviewed. SW reviewed home care and options, selected [facility], referral initiated today. Pt has no concern for d/c to home with support from mother.     Address post surgery:  [address]  [place] [address]    Fernand Parkins MSW  Pager: [phone]

## 2021-10-12 NOTE — Invasive Procedure Plan of Care (Signed)
CONSENT FOR MEDICAL  OR SURGICAL PROCEDURE                            Patient Name: Carly Higgins  [facility] [address]                                                              DOB: [birth year]         Please read this form or have someone read it to you.   It's important to understand all parts of this form. If something isn't clear, ask [place] to explain.   When you sign it, that means you understand the form and give [place] permission to do this surgery or procedure.     I agree for Fransisca Connors, MD along with any assistants* they may choose, to treat the following condition(s): Pain and inflammation of the hip joint   By doing this surgery or procedure on me: Complete replacement of the hip joint   This is also known as: Total Hip Arthroplasty   Laterality: Left     *if you'd like a list of the assistants, please ask. We can give that to you.    1. The care provider has explained my condition to me. They have told me how the procedure can help me. They have told me about other ways of treating my condition. I understand the care provider cannot guarantee the result of the procedure. If I don't have this procedure, my other choices are: No surgery, conservative treatment    2. The care provider has told me the risks (problems that can happen) of the procedure. I understand there may be unwanted results. The risks that are related to this procedure include: Bleeding, failure of wound healing, abnormal blood clot formation in extremities or lungs (DVT/PE), nerve deficit (injury or temporary loss of function resulting in weakness, numbness), blood vessel injury, instability or dislocation of joint, fracture or break of bone around the implant, damage to muscles/tendons resulting in weakness or gait abnormality, deep infection of the joint, abnormal formation of bone in tissues resulting in stiffness or pain, implant wear, implant loosening, other modes of implant failure (fracture, failure of junctions), bone loss, leg length  discrepancy (one leg longer or shorter than the other), persistent pain, potential loss of limb structure or function especially in the setting of nerve or blood vessel injury, need for reoperation or revision.    Medical risks of anesthesia and surgery include cardiopulmonary (heart and lung), renal (kidney), gastrointestinal (stomach and bowels), neurological (brain, nerve) complications such as stroke, heart attack, and even death.    3. I understand that during the procedure, my care provider may find a condition that we didn't know about before the treatment started. Therefore, I agree that my care provider can perform any other treatment which they think is necessary and available.    4. I give permission to the hospital and/or its departments to examine and keep tissue, blood, body parts, fluids or materials removed from my body during the procedure(s) to aid in diagnosis and treatment, after which they may be used for scientific research or teaching by appropriate persons. If these materials are used for science or teaching, my identity will be protected.  I will no longer own or have any rights to these materials regardless of how they may be used.    5. My care provider might want a representative from a medical device company to be there during my procedure. I understand that person works for:  [employer], [employer]        The ways they might help my care provider during my procedure include:        Helping the operating room staff prepare the special device my care provider wants to use., Providing information and support to operating room staff regarding the device. and Providing information and support to operating room staff regarding the device    6. Here are my decisions about receiving blood, blood products, or tissues. I understand my decisions cover the time before, during and after my procedure, my treatment, and my time in the hospital. After my procedure, if my condition changes  a lot, my care provider will talk with me again about receiving blood or blood products. At that time, my care provider might need me to review and sign another consent form, about getting or refusing blood.    I understand that the blood is from the community blood supply. Volunteers donated the blood, the volunteers were screened for health problems. The blood was examined with very sensitive and accurate tests to look for hepatitis, HIV/AIDS, and other diseases. Before I receive blood, it is tested again to make sure it is the correct type.    My chances of getting a sickness from blood products are small. But no transfusion is 100% safe. I understand that my care provider feels the good I will receive from the blood is greater than the chances of something going wrong. My care provider has answered my questions about blood products.      My decision  about blood or  blood products   Yes, I agree to recieve blood or blood products if my care provider thinks they're needed.        My decision   about tissue  Implants     Yes, I agree to recieve tissue implants if my care provider thinks they're needed.          I understand this  form.    My care provider  or his/her  assistants have  explained:   What I am having done and why I need it.  What other choices I can make instead of having this done.  The benefits and possible risks (problems) to me of having this done.  The benefits and possible risks (problems) to me of receiving transplants, blood, or blood products.  There is no guarantee of the results.  The care provider may not stay with me the entire time that I am in the operating or procedure room.  My provider has explained how this may affect my procedure. My provider has answered my  questions about this.         I give my  permission for  this surgery or  procedure.            _______________________________________________                                     My signature  (or parent or other person  authorized to sign for you, if you are unable to sign for  yourself or if you are under  46 years old)        ______           Date        _____        Time   Electronic Signatures will display at the bottom of the consent form.    Care provider's statement: I have discussed the planned procedure, including the possibility for transfusion of blood  products or receipt of tissue as necessary; expected benefits; the possible complications and risks; and possible alternatives  and their benefits and risks with the patients or the patient's surrogate. In my opinion, the patient or the patient's surrogate  understands the proposed procedure, its risks, benefits and alternatives.              Electronically signed by: Fransisca Connors, MD                                                10/12/2021         Date        8:19 AM        Time

## 2021-10-12 NOTE — Progress Notes (Signed)
Total Hip Arthroplasty Preoperative visit, Surgical Discussion    Carly Higgins presents for preoperative consent visit for upcoming left total hip arthroplasty. She continues to experience debilitating pain in the hip refractory to conservative treatment interventions. It is significantly impacting their quality of life, overall function, and ability to complete activities of daily living and/or participate in activities of enjoyment and recreation. 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    Refer to previous office visit notes and documentation for physical examination findings.  There are no new updates to the patient's physical examination today.     We once again reviewed that the proposed surgery is ideal for treating the pain from inflammation due to joint pathology and that other extra-articular sources of pain may not be fully relieved after full recovery.  Improvements in range of motion, joint stability, and alignment are secondary reasons to perform this surgery, and improvements in these domains are less predictable. Furthermore, we reviewed the long term implications of undergoing joint replacement including the importance of future activity modifications to avoid high impact activity to maximize implant longevity.  Preoperative preparation, perioperative scheduling, postoperative expectations and rehabilitation were all reviewed in the office today.  She has completed joint replacement education class and is fully informed on preoperative preparation, what to expect on the day of surgery, and expectations about the postoperative hospital stay and recovery from the operation.  She has completed coordinated preoperative medical evaluation with their primary care physician, any necessary medical specialists, and our presurgical testing team and obtained clearance to proceed with the proposed surgery.     The risks and proposed benefits of a total  hip arthroplasty were discussed with the patient in detail.  The benefits involve relief of pain and the associated improvement in quality of life as a result of the pain relief.  Improvement in hip range-of-motion or stiffness may or may not be attained and is less predictable.  The risks of the surgery were detailed to the patient and include bleeding, wound healing complications, venous thromboembolic disease (DVT/PE), neural deficit or injury, vascular injury, injury to other soft tissue structures (muscle/tendon/ligament) resulting in instability, pain or gait abnormality (i.e. hip abductor complex), prosthetic joint instability or dislocation, periprosthetic fracture, deep prosthetic joint infection, heterotopic ossification, implant wear, osteolysis due to bearing wear, implant loosening, other modes of implant failure (fracture, dissociation or corrosion), persistent pain, need for future reoperation.  Medical risks were also explained to the patient and include cardiopulmonary, renal, gastrointestinal and neurological (cognitive) complications.  The patient comprehends and understands the risks clearly and wishes to proceed with the indicated total hip arthroplasty.      Risk stratification preoperatively for selection of postoperative anticoagulation to reduce their risk of deep vein thrombosis and pulmonary embolism has been or will be coordinated with the patient's care team.  Anticoagulation choices are made based on recommendations from the American Academy of Orthopedic Surgeons guidelines and the American College of Chest Physician guidelines.  For patients at a normal risk, we use aspirin postoperatively to prevent blood clots.  This provides appropriate risk reduction while decreasing the risk of postoperative bleeding and hematoma.  The risk of venous thromboembolism is weighed on an individual case by case basis, and is determined based on well studied individual risk factors.  We also reviewed  that scarring around the incision is normal and that there may be some numbness or tenderness around the incision.     We  discussed the variety of technical strategies employed to perform this procedure, as well as risks and benefits specific to each particular strategy including specific surgical approaches (anterior vs posterior hip approaches), alignment and implant fixation strategies, the intraoperative use of technology (fluoroscopy, computer navigation, robotic arm assistance, augmented/mixed reality).  We reviewed modern research and evidence assessing the impact of these various techniques on outcomes from the procedure.  We discussed the evidence on arthroplasty bearing surfaces and the risks/benefits to the various combinations of materials available.  Implant selection discussed, along with the proposed fixation strategy and use of technology, if applicable.       Based on the above discussion, informed consent to proceed with the proposed operation was reviewed today and submitted electronically, to be printed and signed on the day of surgery.  All questions regarding the procedure were discussed and answered to the patient's satisfaction.    Fransisca Connors, MD  Division of Adult Reconstruction  [facility]  Department of Orthopaedics and Rehabilitation

## 2021-10-12 NOTE — Telephone Encounter (Signed)
Patient notified that the clearance letter for hip surgery has been sent as requested.

## 2021-10-12 NOTE — Telephone Encounter (Signed)
Letter sent. Patient notified.

## 2021-10-13 ENCOUNTER — Telehealth: Payer: Self-pay | Admitting: Internal Medicine

## 2021-10-13 NOTE — Telephone Encounter (Signed)
Carly Higgins from [facility] [phone]  Would like a verbal for [facility] to sign any non ortho HC orders  Pt having hip surgery next week./kk

## 2021-10-16 LAB — NICOTINE & METABOLITE
Cotinine: <5 ng/mL
Nicotine: <5 ng/mL

## 2021-10-20 ENCOUNTER — Other Ambulatory Visit: Payer: Self-pay

## 2021-10-20 ENCOUNTER — Inpatient Hospital Stay: Payer: Medicare Other

## 2021-10-20 ENCOUNTER — Encounter
Admission: RE | Disposition: A | Discharge status: Home Health | Payer: Self-pay | Source: Ambulatory Visit | Attending: Orthopedic Surgery

## 2021-10-20 ENCOUNTER — Inpatient Hospital Stay
Admission: RE | Admit: 2021-10-20 | Discharge: 2021-10-21 | Disposition: A | Discharge status: Home Health | Payer: Medicare Other | Source: Ambulatory Visit | Attending: Orthopedic Surgery | Admitting: Orthopedic Surgery

## 2021-10-20 ENCOUNTER — Inpatient Hospital Stay: Payer: Medicare Other | Admitting: Internal Medicine

## 2021-10-20 ENCOUNTER — Encounter: Payer: Self-pay | Admitting: Orthopedic Surgery

## 2021-10-20 DIAGNOSIS — Z87891 Personal history of nicotine dependence: Secondary | ICD-10-CM | POA: Insufficient documentation

## 2021-10-20 DIAGNOSIS — M25552 Pain in left hip: Secondary | ICD-10-CM

## 2021-10-20 DIAGNOSIS — Z96642 Presence of left artificial hip joint: Secondary | ICD-10-CM | POA: Insufficient documentation

## 2021-10-20 DIAGNOSIS — M25752 Osteophyte, left hip: Secondary | ICD-10-CM | POA: Insufficient documentation

## 2021-10-20 DIAGNOSIS — K219 Gastro-esophageal reflux disease without esophagitis: Secondary | ICD-10-CM | POA: Insufficient documentation

## 2021-10-20 DIAGNOSIS — E785 Hyperlipidemia, unspecified: Secondary | ICD-10-CM | POA: Insufficient documentation

## 2021-10-20 DIAGNOSIS — Z6841 Body Mass Index (BMI) 40.0 and over, adult: Secondary | ICD-10-CM | POA: Insufficient documentation

## 2021-10-20 DIAGNOSIS — Z7982 Long term (current) use of aspirin: Secondary | ICD-10-CM | POA: Insufficient documentation

## 2021-10-20 DIAGNOSIS — M1612 Unilateral primary osteoarthritis, left hip: Secondary | ICD-10-CM | POA: Insufficient documentation

## 2021-10-20 DIAGNOSIS — E119 Type 2 diabetes mellitus without complications: Secondary | ICD-10-CM | POA: Insufficient documentation

## 2021-10-20 DIAGNOSIS — Z471 Aftercare following joint replacement surgery: Secondary | ICD-10-CM

## 2021-10-20 HISTORY — DX: Presence of left artificial hip joint: Z96.642

## 2021-10-20 HISTORY — PX: PR ARTHRP ACETBLR/PROX FEM PROSTC AGRFT/ALGRFT: 27130

## 2021-10-20 LAB — BASIC METABOLIC PANEL
Anion Gap: 14 (ref 7–16)
CO2: 24 mmol/L (ref 20–28)
Calcium: 9.1 mg/dL (ref 8.6–10.2)
Chloride: 99 mmol/L (ref 96–108)
Creatinine: 0.68 mg/dL (ref 0.51–0.95)
Glucose: 247 mg/dL — ABNORMAL HIGH (ref 60–99)
Lab: 17 mg/dL (ref 6–20)
Potassium: 4.3 mmol/L (ref 3.3–5.1)
Sodium: 137 mmol/L (ref 133–145)
eGFR BY CREAT: 99 *

## 2021-10-20 LAB — HCT AND HGB
Hematocrit: 33 % — ABNORMAL LOW (ref 34–49)
Hemoglobin: 10.7 g/dL — ABNORMAL LOW (ref 11.2–16.0)

## 2021-10-20 LAB — MCHC: MCHC: 32 g/dL (ref 32–36)

## 2021-10-20 LAB — POCT GLUCOSE
Glucose POCT: 120 mg/dL — ABNORMAL HIGH (ref 60–99)
Glucose POCT: 114 mg/dL — ABNORMAL HIGH (ref 60–99)
Glucose POCT: 236 mg/dL — ABNORMAL HIGH (ref 60–99)

## 2021-10-20 SURGERY — ARTHROPLASTY, HIP, TOTAL
Anesthesia: General | Site: Hip | Laterality: Left | Wound class: Clean

## 2021-10-20 MED ORDER — OXYCODONE HCL 5 MG PO TABS *I*
5.0000 mg | ORAL_TABLET | ORAL | Status: DC | PRN
Start: 2021-10-20 — End: 2021-10-21
  Administered 2021-10-20: 5 mg via ORAL
  Filled 2021-10-20: qty 1

## 2021-10-20 MED ORDER — DEXAMETHASONE SODIUM PHOSPHATE 4 MG/ML INJ SOLN *WRAPPED*
INTRAMUSCULAR | Status: AC
Start: 2021-10-20 — End: 2021-10-20
  Filled 2021-10-20: qty 1

## 2021-10-20 MED ORDER — JUICE (FOR HYPOGLYCEMIA) *I*
120.0000 mL | ORAL | Status: DC | PRN
Start: 2021-10-20 — End: 2021-10-21

## 2021-10-20 MED ORDER — LACTATED RINGERS IV SOLN *I*
20.0000 mL/h | INTRAVENOUS | Status: DC
Start: 2021-10-20 — End: 2021-10-20
  Administered 2021-10-20: 20 mL/h via INTRAVENOUS

## 2021-10-20 MED ORDER — GLUCAGON HCL (RDNA) 1 MG IJ SOLR *WRAPPED*
1.0000 mg | INTRAMUSCULAR | Status: DC | PRN
Start: 2021-10-20 — End: 2021-10-21

## 2021-10-20 MED ORDER — CELECOXIB 200 MG PO CAPS *I*
200.0000 mg | ORAL_CAPSULE | Freq: Once | ORAL | Status: AC
Start: 2021-10-20 — End: 2021-10-20
  Administered 2021-10-20: 200 mg via ORAL
  Filled 2021-10-20: qty 1

## 2021-10-20 MED ORDER — INSULIN LISPRO (HUMAN) 100 UNIT/ML IJ/SC SOLN *WRAPPED*
0.0000 [IU] | SUBCUTANEOUS | Status: DC | PRN
Start: 2021-10-20 — End: 2021-10-20

## 2021-10-20 MED ORDER — NALOXONE HCL 0.4 MG/ML IJ SOLN *WRAPPED*
0.0400 mg | Status: DC | PRN
Start: 2021-10-20 — End: 2021-10-20

## 2021-10-20 MED ORDER — EPHEDRINE 5MG/ML IN NS IV/IJ *WRAPPED*
INTRAMUSCULAR | Status: DC | PRN
Start: 2021-10-20 — End: 2021-10-20
  Administered 2021-10-20: 15 mg via INTRAVENOUS
  Administered 2021-10-20: 10 mg via INTRAVENOUS

## 2021-10-20 MED ORDER — SODIUM CHLORIDE 0.9 % FLUSH FOR PUMPS *I*
0.0000 mL/h | INTRAVENOUS | Status: DC | PRN
Start: 2021-10-20 — End: 2021-10-21

## 2021-10-20 MED ORDER — MIDAZOLAM HCL 1 MG/ML IJ SOLN *I* WRAPPED
INTRAMUSCULAR | Status: AC
Start: 2021-10-20 — End: 2021-10-20
  Filled 2021-10-20: qty 2

## 2021-10-20 MED ORDER — LEVOTHYROXINE SODIUM 50 MCG PO TABS *I*
50.0000 ug | ORAL_TABLET | Freq: Every day | ORAL | Status: DC
Start: 2021-10-21 — End: 2021-10-21
  Administered 2021-10-21: 50 ug via ORAL
  Filled 2021-10-20: qty 1

## 2021-10-20 MED ORDER — ONDANSETRON HCL 2 MG/ML IV SOLN *I*
4.0000 mg | Freq: Four times a day (QID) | INTRAMUSCULAR | Status: DC | PRN
Start: 2021-10-20 — End: 2021-10-21
  Administered 2021-10-20: 4 mg via INTRAVENOUS
  Filled 2021-10-20: qty 2

## 2021-10-20 MED ORDER — INSULIN LISPRO (HUMAN) 100 UNIT/ML IJ/SC SOLN *WRAPPED*
0.0000 [IU] | Freq: Three times a day (TID) | SUBCUTANEOUS | Status: DC
Start: 2021-10-20 — End: 2021-10-21
  Administered 2021-10-20: 3 [IU] via SUBCUTANEOUS
  Filled 2021-10-20: qty 3

## 2021-10-20 MED ORDER — CEPHALEXIN 250 MG PO CAPS *I*
500.0000 mg | ORAL_CAPSULE | Freq: Four times a day (QID) | ORAL | Status: DC
Start: 2021-10-21 — End: 2021-10-21

## 2021-10-20 MED ORDER — DULOXETINE HCL 30 MG PO CPEP *I*
30.0000 mg | DELAYED_RELEASE_CAPSULE | Freq: Every day | ORAL | Status: DC
Start: 2021-10-20 — End: 2021-10-21
  Administered 2021-10-20: 30 mg via ORAL
  Filled 2021-10-20: qty 1

## 2021-10-20 MED ORDER — GLUCOSE 40 % PO GEL *I*
15.0000 g | ORAL | Status: DC | PRN
Start: 2021-10-20 — End: 2021-10-21

## 2021-10-20 MED ORDER — ONDANSETRON HCL 2 MG/ML IV SOLN *I*
INTRAMUSCULAR | Status: DC | PRN
Start: 2021-10-20 — End: 2021-10-20
  Administered 2021-10-20: 4 mg via INTRAVENOUS

## 2021-10-20 MED ORDER — PROPOFOL 10 MG/ML IV EMUL (INTERMITTENT DOSING) WRAPPED *I*
INTRAVENOUS | Status: DC | PRN
Start: 2021-10-20 — End: 2021-10-20
  Administered 2021-10-20: 200 mg via INTRAVENOUS

## 2021-10-20 MED ORDER — SENNOSIDES 8.6 MG PO TABS *I*
2.0000 | ORAL_TABLET | Freq: Every evening | ORAL | Status: DC
Start: 2021-10-20 — End: 2021-10-21
  Administered 2021-10-20: 2 via ORAL
  Filled 2021-10-20: qty 2

## 2021-10-20 MED ORDER — HYDROMORPHONE HCL PF 1 MG/ML IJ SOLN *WRAPPED*
0.5000 mg | INTRAMUSCULAR | Status: DC | PRN
Start: 2021-10-20 — End: 2021-10-20

## 2021-10-20 MED ORDER — BUPIVACAINE IN DEXTROSE 0.75-8.25 % IT SOLN *I*
INTRATHECAL | Status: DC | PRN
Start: 2021-10-20 — End: 2021-10-20
  Administered 2021-10-20: 1.4 mL via INTRATHECAL

## 2021-10-20 MED ORDER — SODIUM CHLORIDE 0.9 % 25 ML IV SOLN *I*
5.0000 mg | Freq: Once | INTRAVENOUS | Status: DC | PRN
Start: 2021-10-20 — End: 2021-10-20

## 2021-10-20 MED ORDER — APIXABAN 2.5 MG PO TABS *I*
2.5000 mg | ORAL_TABLET | Freq: Two times a day (BID) | ORAL | Status: DC
Start: 2021-10-21 — End: 2021-10-21
  Administered 2021-10-21: 2.5 mg via ORAL
  Filled 2021-10-20: qty 1

## 2021-10-20 MED ORDER — LIDOCAINE HCL 1 % IJ SOLN *I*
INTRAMUSCULAR | Status: DC | PRN
Start: 2021-10-20 — End: 2021-10-20
  Administered 2021-10-20: 3 mL via SUBCUTANEOUS

## 2021-10-20 MED ORDER — CEFADROXIL 500 MG PO CAPS *I*
500.0000 mg | ORAL_CAPSULE | Freq: Two times a day (BID) | ORAL | 0 refills | Status: AC
Start: 2021-10-20 — End: 2021-11-04
  Filled 2021-10-20: qty 28, 14d supply, fill #0

## 2021-10-20 MED ORDER — MAGNESIUM HYDROXIDE 400 MG/5ML PO SUSP *I*
30.0000 mL | Freq: Every day | ORAL | Status: DC | PRN
Start: 2021-10-20 — End: 2021-10-21

## 2021-10-20 MED ORDER — GLUCAGON HCL (RDNA) 1 MG IJ SOLR *WRAPPED*
1.0000 mg | Freq: Once | INTRAMUSCULAR | Status: DC | PRN
Start: 2021-10-20 — End: 2021-10-20

## 2021-10-20 MED ORDER — BUPIVACAINE HCL 0.25 % IJ SOLUTION *WRAPPED*
Status: AC
Start: 2021-10-20 — End: 2021-10-20
  Filled 2021-10-20: qty 60

## 2021-10-20 MED ORDER — PHENYLEPHRINE 100 MCG/ML IN NS 10 ML *WRAPPED*
INTRAMUSCULAR | Status: DC | PRN
Start: 2021-10-20 — End: 2021-10-20
  Administered 2021-10-20 (×4): 100 ug via INTRAVENOUS

## 2021-10-20 MED ORDER — DEXAMETHASONE SODIUM PHOSPHATE 4 MG/ML INJ SOLN *WRAPPED*
INTRAMUSCULAR | Status: DC | PRN
Start: 2021-10-20 — End: 2021-10-20
  Administered 2021-10-20: 4 mg via INTRAVENOUS

## 2021-10-20 MED ORDER — CHLORHEXIDINE GLUCONATE 0.12 % MT SOLN *STORES SUPPLIED*
15.0000 mL | Freq: Once | OROMUCOSAL | Status: DC
Start: 2021-10-20 — End: 2021-10-20

## 2021-10-20 MED ORDER — CEFAZOLIN 2000 MG IN STERILE WATER 20ML SYRINGE *I*
2000.0000 mg | PREFILLED_SYRINGE | Freq: Three times a day (TID) | INTRAVENOUS | Status: AC
Start: 2021-10-20 — End: 2021-10-21
  Administered 2021-10-20 – 2021-10-21 (×2): 2000 mg via INTRAVENOUS
  Filled 2021-10-20 (×2): qty 20

## 2021-10-20 MED ORDER — LIDOCAINE HCL (PF) 1 % IJ SOLN *I*
0.1000 mL | INTRAMUSCULAR | Status: DC | PRN
Start: 2021-10-20 — End: 2021-10-20
  Filled 2021-10-20: qty 2

## 2021-10-20 MED ORDER — DEXTROSE 50 % IV SOLN *I*
25.0000 g | INTRAVENOUS | Status: DC | PRN
Start: 2021-10-20 — End: 2021-10-21

## 2021-10-20 MED ORDER — FENTANYL CITRATE 50 MCG/ML IJ SOLN *WRAPPED*
INTRAMUSCULAR | Status: AC
Start: 2021-10-20 — End: 2021-10-20
  Filled 2021-10-20: qty 2

## 2021-10-20 MED ORDER — SODIUM CHLORIDE 0.9 % IV SOLN WRAPPED *I*
20.0000 mL/h | Status: DC
Start: 2021-10-20 — End: 2021-10-20

## 2021-10-20 MED ORDER — TRANEXAMIC ACID 1000 MG / 0.7% NACL 100 ML IVPB *I*
1000.0000 mg | Freq: Once | INTRAVENOUS | Status: AC
Start: 2021-10-20 — End: 2021-10-20
  Administered 2021-10-20: 1000 mg via INTRAVENOUS
  Filled 2021-10-20: qty 100

## 2021-10-20 MED ORDER — SODIUM CHLORIDE 0.9 % IV SOLN WRAPPED *I*
75.0000 mL/h | Status: DC
Start: 2021-10-20 — End: 2021-10-21
  Administered 2021-10-20: 75 mL/h via INTRAVENOUS

## 2021-10-20 MED ORDER — ONDANSETRON HCL 2 MG/ML IV SOLN *I*
INTRAMUSCULAR | Status: AC
Start: 2021-10-20 — End: 2021-10-20
  Filled 2021-10-20: qty 2

## 2021-10-20 MED ORDER — DEXTROSE 50 % IV SOLN *I*
12.5000 g | INTRAVENOUS | Status: DC | PRN
Start: 2021-10-20 — End: 2021-10-20

## 2021-10-20 MED ORDER — POLYETHYLENE GLYCOL 3350 PO PACK 17 GM *I*
17.0000 g | PACK | Freq: Every day | ORAL | Status: DC
Start: 2021-10-20 — End: 2021-10-21
  Administered 2021-10-20 – 2021-10-21 (×2): 17 g via ORAL
  Filled 2021-10-20 (×2): qty 17

## 2021-10-20 MED ORDER — FENTANYL CITRATE 50 MCG/ML IJ SOLN *WRAPPED*
INTRAMUSCULAR | Status: DC | PRN
Start: 2021-10-20 — End: 2021-10-20
  Administered 2021-10-20: 100 ug via INTRAVENOUS

## 2021-10-20 MED ORDER — PROPOFOL 10 MG/ML IV EMUL (INTERMITTENT DOSING) WRAPPED *I*
INTRAVENOUS | Status: AC
Start: 2021-10-20 — End: 2021-10-20
  Filled 2021-10-20: qty 20

## 2021-10-20 MED ORDER — POVIDONE-IODINE 5 % EX SOLN *I*
Freq: Once | CUTANEOUS | Status: AC
Start: 2021-10-20 — End: 2021-10-20

## 2021-10-20 MED ORDER — DOCUSATE SODIUM 100 MG PO CAPS *I*
200.0000 mg | ORAL_CAPSULE | Freq: Every day | ORAL | Status: DC
Start: 2021-10-20 — End: 2021-10-21
  Administered 2021-10-20 – 2021-10-21 (×2): 200 mg via ORAL
  Filled 2021-10-20 (×2): qty 2

## 2021-10-20 MED ORDER — HALOPERIDOL LACTATE 5 MG/ML IJ SOLN *I*
INTRAMUSCULAR | Status: DC | PRN
Start: 2021-10-20 — End: 2021-10-20
  Administered 2021-10-20: 1 mg via INTRAVENOUS

## 2021-10-20 MED ORDER — CALCIUM CARBONATE ANTACID 500 MG PO CHEW *I*
1000.0000 mg | CHEWABLE_TABLET | Freq: Three times a day (TID) | ORAL | Status: DC | PRN
Start: 2021-10-20 — End: 2021-10-21

## 2021-10-20 MED ORDER — MIDAZOLAM HCL 1 MG/ML IJ SOLN *I* WRAPPED
INTRAMUSCULAR | Status: DC | PRN
Start: 2021-10-20 — End: 2021-10-20
  Administered 2021-10-20: 2 mg via INTRAVENOUS

## 2021-10-20 MED ORDER — ATORVASTATIN CALCIUM 40 MG PO TABS *I*
40.0000 mg | ORAL_TABLET | Freq: Every evening | ORAL | Status: DC
Start: 2021-10-20 — End: 2021-10-21
  Administered 2021-10-20: 40 mg via ORAL
  Filled 2021-10-20: qty 1

## 2021-10-20 MED ORDER — OXYCODONE HCL 5 MG PO TABS *I*
10.0000 mg | ORAL_TABLET | ORAL | Status: DC | PRN
Start: 2021-10-20 — End: 2021-10-21
  Administered 2021-10-20 – 2021-10-21 (×3): 10 mg via ORAL
  Filled 2021-10-20 (×3): qty 2

## 2021-10-20 MED ORDER — DEXTROSE 5 % FLUSH FOR PUMPS *I*
0.0000 mL/h | INTRAVENOUS | Status: DC | PRN
Start: 2021-10-20 — End: 2021-10-21

## 2021-10-20 MED ORDER — CEFAZOLIN 2000 MG IN STERILE WATER 20ML SYRINGE *I*
2000.0000 mg | PREFILLED_SYRINGE | Freq: Once | INTRAVENOUS | Status: AC
Start: 2021-10-20 — End: 2021-10-20
  Administered 2021-10-20: 2000 mg via INTRAVENOUS
  Filled 2021-10-20: qty 20

## 2021-10-20 MED ORDER — TRAZODONE HCL 50 MG PO TABS *I*
50.0000 mg | ORAL_TABLET | Freq: Every evening | ORAL | Status: DC | PRN
Start: 2021-10-20 — End: 2021-10-21
  Administered 2021-10-20: 50 mg via ORAL
  Filled 2021-10-20: qty 1

## 2021-10-20 MED ORDER — ACETAMINOPHEN 500 MG PO TABS *I*
1000.0000 mg | ORAL_TABLET | Freq: Three times a day (TID) | ORAL | Status: DC
Start: 2021-10-20 — End: 2021-10-21
  Administered 2021-10-20 – 2021-10-21 (×2): 1000 mg via ORAL
  Filled 2021-10-20 (×2): qty 2

## 2021-10-20 MED ORDER — ACETAMINOPHEN 500 MG PO TABS *I*
1000.0000 mg | ORAL_TABLET | Freq: Once | ORAL | Status: AC
Start: 2021-10-20 — End: 2021-10-20
  Administered 2021-10-20: 1000 mg via ORAL
  Filled 2021-10-20: qty 2

## 2021-10-20 MED ORDER — LACTATED RINGERS IV SOLN *I*
125.0000 mL/h | INTRAVENOUS | Status: DC
Start: 2021-10-20 — End: 2021-10-20

## 2021-10-20 MED ORDER — METOPROLOL TARTRATE 25 MG PO TABS *I*
50.0000 mg | ORAL_TABLET | Freq: Two times a day (BID) | ORAL | Status: DC
Start: 2021-10-20 — End: 2021-10-21
  Administered 2021-10-20: 50 mg via ORAL
  Filled 2021-10-20 (×2): qty 2

## 2021-10-20 MED ORDER — ACYCLOVIR 800 MG PO TABS *I*
400.0000 mg | ORAL_TABLET | Freq: Two times a day (BID) | ORAL | Status: DC
Start: 2021-10-20 — End: 2021-10-21
  Administered 2021-10-20 – 2021-10-21 (×2): 400 mg via ORAL
  Filled 2021-10-20 (×3): qty 1

## 2021-10-20 MED ORDER — LIDOCAINE HCL 2 % IJ SOLN *I*
INTRAMUSCULAR | Status: DC | PRN
Start: 2021-10-20 — End: 2021-10-20
  Administered 2021-10-20: 100 mg via INTRAVENOUS

## 2021-10-20 MED ORDER — LIDOCAINE HCL 2 % (PF) IJ SOLN *I*
INTRAMUSCULAR | Status: AC
Start: 2021-10-20 — End: 2021-10-20
  Filled 2021-10-20: qty 5

## 2021-10-20 MED ORDER — ONE STEP DOSE TO ADMINISTER *I*
INTRAMUSCULAR | Status: DC | PRN
Start: 2021-10-20 — End: 2021-10-20
  Administered 2021-10-20: 61 mL via INTRA_ARTICULAR

## 2021-10-20 SURGICAL SUPPLY — 48 items
APPLICATOR CHLORAPREP STER  ORANGE 26ML (Supply) ×2 IMPLANT
BIT DRILL 2.4MM QC-100MM (Supply) ×1 IMPLANT
BLADE DUAL CUT SAG 1.35MM THICK (Supply) ×1 IMPLANT
BOWL STERILE MED 16OZ (Supply) ×1 IMPLANT
DRAPE SUR W112XL137IN 5 LAYR HIP SMS POLYPR ABSRB REINF FEN DISP FOR ORTH PROC (Drape) ×1 IMPLANT
DRAPE SUR W70XL100IN STD SMS POLYPR FULL SHT W/O FLD PCH DISP (Drape) ×2 IMPLANT
DRAPE SURG IOBAN 2 ANTIMIC LG 23 X 17IN (Drape) ×1 IMPLANT
DRESSING ANTIMICROBIAL 3.5 X 10 INCH AQUACEL AG ADVANTAGE (Dressing) IMPLANT
DRESSING VAC PREVENA PEEL N PLACE 20CM (Supply) ×1 IMPLANT
ELECTRODE EXT BLADE SS 6.5IN (Supply) ×1 IMPLANT
FILTER NEPTUNE 4PORT MANIFOLD (Supply) ×1 IMPLANT
GLOVE SURG BIOGEL PI ULTRATOUCH SZ 7.5 (Glove) ×2 IMPLANT
GLOVE SURG GAMMEX NON-LATEX ORTHO PI SZ 8 (Glove) ×1 IMPLANT
GOWN PREVENTION PLUS X-LONG XXL (Gown) ×1 IMPLANT
HANDPIECE INTERPULSE W/COAXIAL BONE (Supply) ×1 IMPLANT
HEAD DELTA CERAMIC 36MM SZ 5 (Implant) ×1 IMPLANT
HOOD SURGICAL W/PEEL AWAY FACE SHIELD T7PLUS (Supply) ×1 IMPLANT
HOOD W/ANTI-REFLECTIVE LENS T7PLUS (Supply) ×2 IMPLANT
LINER ACETABULAR 52MM 36MM AOX ELEVATED RIM EMPHASYS (Implant) ×1 IMPLANT
LINER ACETABULAR OD52MM 36MM AOX ELEVATED RIM EMPHASYS (Implant) ×1 IMPLANT
PACK CUSTOM TOTAL HIP TRAY (Pack) ×1 IMPLANT
PACK OR ORTHO BOLSTER (Pack) ×1 IMPLANT
PEN SURG MARKER REG TIP (Supply) ×1 IMPLANT
PENCIL SMOKE EVAC COATED PB (Supply) ×1 IMPLANT
PROTECTOR ULNA NERVE ~~LOC~~ (Supply) ×1 IMPLANT
RETRIEVER HEWSON SUTR (Supply) ×1 IMPLANT
SHELL ACETABULAR 52MM 3 HOLE CEMENTLESS EMPHASYS (Implant) ×1 IMPLANT
SLEEVE COMP KNEE MED BLENDED (Supply) ×1 IMPLANT
SLEEVE SMOKE EVAC SUCT 165MM (Supply) ×1 IMPLANT
SOL SOD CHL IRRIG .9PCT 1000ML BAG (Solution) ×1 IMPLANT
SOL SOD CHL IRRIG 500ML BTL (Solution) ×1 IMPLANT
SOL WATER IRRIG STERILE 1000ML BTL (Solution) ×2 IMPLANT
SPIKE MINI DISPENSING PIN (BBRAUN DP1000) (Supply) ×1 IMPLANT
STEM FEMORAL ACTIS STD COLLARED  SZ 6 (Implant) ×1 IMPLANT
STRAP REPLCMT LITHOTOMY 10 X 1.5IN (Supply) ×1 IMPLANT
SUTR QUILL MONODERM PREC 2-0 24MM 30X30 (Suture) ×1 IMPLANT
SUTR STARTAFIX 1 PDS PLUS CTZ 60CM (Suture) ×1 IMPLANT
SUTR STRATAFIX SPIRAL MONO 3-0 PS-1 45CM (Suture) ×1 IMPLANT
SUTR VICRYL ANTIB 1 CTX 18 POP VIOLET (Suture) ×1 IMPLANT
SUTURE ETHBND EXCEL SZ 2 L30IN NONABSORBABLE GRN L40MM V-37 1/2 CIR TAPERCUT NDL POLY COAT (Suture) ×1 IMPLANT
SUTURE VCRL + SZ 0 L27IN ABSRB UD CP-1 1/2 CIR REV CUT NDL POLYGLACTIN 910 BRAID ANTIBACT (Suture) ×1 IMPLANT
SYRINGE  LUER LOCK  STERILE  60ML (Syringe) ×2
SYRINGE 60ML  LUER LOCK  STERILE (Syringe) ×2 IMPLANT
SYRINGE IRRIG 60ML BULB SOFT PLIABLE ONE HANDED USE W/TYVEK LID (Syringe) ×1 IMPLANT
SYRINGE TB SAFETYGLIDE 1CC 27G X 1/2IN (Syringe) ×1 IMPLANT
SYSTEM SURGIPHOR ANTIMICROBIAL IRRIGATION (Supply) ×1 IMPLANT
TAPE DURAPORE 3IN SILK NL (Dressing) ×1 IMPLANT
UNIT VAC PREVENA PLUS125 THEREPY (Supply) ×1 IMPLANT

## 2021-10-20 NOTE — Plan of Care (Signed)
Problem: Impaired Bed Mobility  Goal: STG - IMPROVE BED MOBILITY  Note: Patient will perform bed mobility without rails and the head of bed flat with No assist (Independently)     Time frame: 3-5 Visits     Problem: Impaired Transfers  Goal: STG - IMPROVE TRANSFERS  Note: Patient will complete Stand pivot transfers using a rolling walker with Modified independence     Time frame: 3-5 Visits     Problem: Impaired Ambulation  Goal: STG - IMPROVE AMBULATION  Note: Patient will ambulate 100-149 feet using a rolling walker with Modified independence    Time frame: 3-5 Visits       Onesti Bonfiglio PT, DPT  Web page or secure chat Joncarlos Atkison

## 2021-10-20 NOTE — Progress Notes (Signed)
Physical Therapy    Initial Eval Completed.  Patient is a 61 y.o.female who presents POD#0 s/p L posterior approach THA 10/20/21, WBAT LLE, no hip precautions, elevated toilet seat for 6wks.    Is another physical therapy session necessary prior to hospital discharge:  Yes    Discharge recommendation:  Anticipate return to prior living arrangement, Intermittent supervision/assist, Home PT  Equipment recommendations upon discharge: None  Mobility recommendations for nursing while in hospital: 1A with RW    Past Medical History:   Diagnosis Date    Atrial fibrillation     fib/flutter 05/2015    Breast cancer     CHF (congestive heart failure)     pulmonary edema 05/28/15    Diabetes mellitus 08/23/2020    GERD (gastroesophageal reflux disease)     Hypothyroidism 06/16/2021    Hypoxia 06/11/2015    Impaired mobility and ADLs 06/11/2015    Leukocytosis 09/22/2013    Lumbar radiculopathy 01/11/2015    Osteoarthrosis 06/16/2021    post allogenic MUD (F)  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    Conditioned w/ Busulfan 130 mg/m2/d and Fludarabine 40 mg/m2/d x4 days followed by 10/10 HLA MUD (F) Allogeneic PBSCT (pt/donor: ABO: A+/A+, CMV N/N)    Pulmonary edema     Shortness of breath     Tobacco use        Past Surgical History:   Procedure Laterality Date    BONE MARROW TRANSPLANT  2017    Mediport insertion      PR AXILLARY LYMPHADENECTOMY COMPLETE Left 01/24/2021    Procedure: sentinel lymph node biopsy;  Surgeon: Sheliah Mends, MD;  Location: [facility] MAIN OR;  Service: Oncology General    PR MASTECTOMY PARTIAL Left 01/24/2021    Procedure: LEFT NEEDLE LOC PARTIAL MASTECTOMY   ;  Surgeon: Sheliah Mends, MD;  Location: [facility] MAIN OR;  Service: Oncology General    ROTATOR CUFF REPAIR Right        *Bold Indicates co-morbidities affecting treatment and recovery    Comorbidities affecting treatment/recovery in addition to those listed above:  none noted    Personal factors affecting treatment/recovery:  Lives alone    Clinical  presentation:  stable       Patient complexity:  low level as indicated by above personal factors, environmental factors and comorbidities in addition to their impairments found on physical exam.     PT Adult Assessment - 10/20/21 1451          Prior Living     Prior Living Situation Reported by patient     Lives With Alone   plans to stay at mothers home    Type of Home 2 Story Home     # Steps to Enter Home --   ramp to enter    Location of Bedrooms 1st floor     Location of Bathrooms 1st floor     Bathroom Accessibility Walk-in shower     Current Home Equipment Walker (rolling);Cane (straight);Grab bars in shower/tub;Commode (3 in 1);Walker (rollator);Medical illustrator Comments mother present during session. Patient plans to stay on first floor        Prior Function Level    Prior Function Level Reported by patient     Transfers Independent     Transfer Devices None     Walking Independent;Used assistive device     Walking assistive devices used Straight Cane;Rollator walker     Additional Comments electric scooter to walk dog  PT Tracking    PT TRACKING PT Assigned     Type of Session evaluation     SW Request? No        Treatment Day    Treatment Day (HH / URR) 1        Precautions/Observations    Precautions used Yes     Total Hip Replacement No hip precautions per MD order;Surgical Approach: Posterior lateral;Elevated toilet seat     Weight Bearing Status Left Lower Extremity - Weight bearing as tolerated     LDA Observation IV lines;VAC     Was patient wearing a mask? No     PPE worn by Clinical research associate [facility]     Fall Precautions General falls precautions;Patient educated to use call light for nursing assist prior to getting up;White board updated with appropriate mobility status        Current Pain Assessment    Pain Assessment / Reassessment Assessment     Pain Scale 0-10 (Numeric Scale for Pain Intensity)      0-10 Scale 4     Pain Location/Orientation Hip Left     Pain Descriptors Burning      (T) Pain Intervention(s) for current pain Repositioned;Cold applied;Ambulation/increased activity        Vision     Current Vision Adequate for PT session        Cognition    Cognition Tested     Arousal/Alertness Appropriate responses to stimuli     Orientation Alert and oriented x4     Ability to Follow Instructions Follows all commands and directions without difficulty        UE Assessment    Assessment Focus Range of Motion;Strength        LUE (degrees)    Overall ROM AROM WFL        RUE (degrees)    Overall ROM AROM WFL        LUE Strength    Overall Strength WFL assessed within functional activities        RUE Strength    Overall Strength WFL assessed within functional activities        LE Assessment    Assessment Focus Range of Motion;Strength        LLE (degrees)    Overall ROM ROM deficits     Additional Comments AROM hip flex deficit        RLE (degrees)    Overall ROM AROM WFL        LLE Strength    Hip Flexion 3-/5     Knee Flexion 3/5     Knee Extension 3/5     Ankle Dorsiflexion 5/5     Ankle Plantar Flexion 5/5        RLE Strength    Hip Flexion 4+/5     Knee Flexion 4+/5     Knee Extension 4+/5     Ankle Dorsiflexion 5/5     Ankle Plantar Flexion 5/5        Sensation    Sensation No apparent deficit        Bed Mobility    Bed mobility Tested     Supine to Sit Supervision;1 person assist;Side rails down;Head of bed flat     Sit to Supine Not tested     Additional comments slight increased time and effort noted        Transfers    Transfers Tested     Stand Pivot Transfers Stand-by assist;1 person  Sit to Stand Contact guard;1 person assist;Verbal cues     Stand to sit Stand by assistance;1 person assist;Verbal cues     Transfer Assistive Device rolling walker     Additional comments instructed on transfers from bed to recliner, verbal cues for hand placement with good follow through. Slight increased time and effort noted        Mobility    Mobility Tested     Weight Bearing Status Left Lower  Extremity - Weight bearing as tolerated     Gait Pattern 2 point;3 point;Antalgic left side;Decreased cadence;Decreased R step length;Decreased R step height;Decreased L step length;Decreased L step height;Decreased stance time L     Ambulation Assist Contact guard;1 person assist     Ambulation Distance (Feet) 150     Ambulation Assistive Device rolling walker     Additional comments instructed on amb in room and hallway, no loss of balance or unsteaidness noted. Patient safely progressed from a 3 point to a 2 point gait pattern during walk        Family/Caregiver Training`    Patient/Family/Caregiver training Yes     Family/caregiver training Role of physical therapy in hospital and plan for evaluation and follow up;Discharge planning;Post-operative precautions     Patient training Role of physical therapy in hospital and plan for evaluation and follow up;Discharge planning;Post-operative precautions;Use of assistive device;Equipment recommendations for discharge;Benefits of oob activity and mobility during hospitalization, including frequency of ambulation and change of position;Call don't fall, purpose of bed/chair alarm, and recommendation for nursing assistance during all oob mobility        Therapeutic Exercises    Exercises Performed LE     Ankle Pumps Bilaterally     Ankle Pumps-Assist Active     Ankle Pumps-Reps 2     Additional comments recommend 10 reps every hour that she's awake to decrease risk for edema and blood clots        Balance    Balance Tested     Sitting - Static Independent;Supported     Sitting - Dynamic Independent;Supported     Standing - Static Standby assist;Supported        Functional Outcome Measures    Functional Outcome Measures Yes        PT AM-PAC Mobility    Turning over in bed? A Little: Minimum/Contact Guard Assist/Supervision     Moving from lying on back to sitting on the side of the bed? A Little: Minimum/Contact Guard Assist/Supervision     Moving to and from a bed to a  chair? A Little: Minimum/Contact Guard Assist/Supervision     Sitting down on and standing up from a chair with arms? A Little: Minimum/Contact Guard Assist/Supervision     Need to walk in hospital room? A Little: Minimum/Contact Guard Assist/Supervision     Climbing 3 - 5 steps with a railing? A Lot: Maximum/Moderate Assist     Total Raw Score 17     Standardized Score - Calculated 42.13     % Functional Impairment - Calculated 51%        Assessment    Brief Assessment Appropriate for skilled therapy     Problem List Impaired LE ROM;Impaired LE strength;Impaired balance;Impaired transfers;Impaired ambulation;Impaired bed mobility;Impaired functional mobility;Pain contributing to impairment     Patient / Family Goal D/C to mothers home     Overall Assessment Patient is POD#0 s/p L posterior approach THA 10/20/21, WBAT LLE, no hip precautions, elevated toilet seat for 6wks. Patient presents  with L hip pain, decreased L hip ROM and strength which results in a decline in all mobility        Plan/Recommendation    PT Treatment Interventions Restorative PT;AROM;Bed mobility training;Transfers training;Balance training;Pt/Family education;Gait training;Home exercise program instruction;D/C planning;Will work to minimize pain while promoting mobility whenever possible     PT Frequency 5-7x/wk;BID Visit     PT Mobility Recommendations 1A with RW     PT Referral Recommendations OT;SW;Home care     PT Discharge Recommendations Anticipate return to prior living arrangement;Intermittent supervision/assist;Home PT     PT Discharge Equipment Recommended None     PT Assessment/Recommendations Reviewed With: Patient;Family;Nursing;Occupational Therapy     Next PT Visit progress per protocol     PT needs to see patient prior to DC  Yes        Time Calculation    PT Timed Codes 0     PT Untimed Codes 18     PT Unbilled Time 0     PT Total Treatment 18        Plan and Onset date    Plan of Care Date 10/20/21     Onset Date 10/20/21      Treatment Start Date 10/20/21                     AMPAC Score Interpretation:  Adapted from Daniel L. Young et al [employer] learning prediction of hospital patient need for post-acute care using admission mobility measure is robust across patient diagnoses. Health Policy and Technology, volume 12, issue 2, June 2023, 100754     Patients 66 years or older with an AM-PAC standardized score of <31 have an increased likelihood of requiring post-acute care (76%)  Patients that are less than 42 years old with an AM-PAC standardized score of <31 (41-59%) or, patients that are>47 years old with an AM-PAC standardized score >/=31 (37-50%) have a moderate likelihood of requiring post-acute care   Patients with an AM-PAC standardized score of >43 have an increased likelihood for discharge to home regardless of age. (5-12%)    Clyda Hurdle PT, DPT  Web page or secure chat Clyda Hurdle

## 2021-10-20 NOTE — Preop H&P (Signed)
UPDATES TO PATIENT'S CONDITION on the DAY OF SURGERY/PROCEDURE    I. Updates to Patient's Condition (to be completed by a provider privileged to complete a H&P, following reassessment of the patient by the provider):    Day of Surgery/Procedure Update:  History  History reviewed and no change    Physical  Physical exam updated and no change       II. Procedure Readiness   I have reviewed the patient's H&P and updated condition. By completing and signing this form, I attest that this patient is ready for surgery/procedure.    III. Attestation   I have reviewed the updated information regarding the patient's condition and it is appropriate to proceed with the planned surgery/procedure.    CATHERYNE MELCHER was seen and evaluated at bedside in pre-op holding, H&P reviewed, no changes noted.  We discussed risks related to total hip arthroplasty (THA) including infection requiring further surgery and prolonged morbidity, bleeding requiring transfusion, nerve injury resulting in permanent quadriceps and or leg weakness/foot drop & gait impairment, nerve injury resulting in numbness in the thigh or foot, blood vessel injury requiring further surgery, iatrogenic femur/acetabular fracture, hip instability, limb length inequality, delayed healing of incisions requiring repeat procedures or more frequent monitoring in the office, complications that may result in loss of limb structure or function, risk of anesthesia, blood clots in the legs and/or lungs, post op ileus, risk of exacerbation of underlying medical issues resulting in heart attack, stroke, respiratory failure, kidney failure, and even death. The patient understood these risks and elected to proceed. Informed consent was obtained, the surgical site initialed, plan to proceed with left total hip arthroplasty.       Fransisca Connors, MD as of 9:36 AM 10/20/2021

## 2021-10-20 NOTE — Anesthesia Procedure Notes (Signed)
---------------------------------------------------------------------------------------------------------------------------------------    AIRWAY   GENERAL INFORMATION AND STAFF    Patient location during procedure: OR       Date of Procedure: 10/20/2021 10:30 AM  CONDITION PRIOR TO MANIPULATION     Current Airway/Neck Condition:  Normal        For more airway physical exam details, see Anesthesia PreOp Evaluation  AIRWAY METHOD     Patient Position:  Sniffing    Preoxygenated: yes      Maintained In-Line Stability: not needed, normal c-spine condition          To see details of medications used, see MAR    Induction: IV    Mask Difficulty Assessment:  0 - not attempted    Number of Attempts at Approach:  1  FINAL AIRWAY DETAILS    Final Airway Type:  LMA    Final LMA: i-gel    LMA Size: 4  ----------------------------------------------------------------------------------------------------------------------------------------

## 2021-10-20 NOTE — Plan of Care (Signed)
Problem: Pain/comfort (joint)  Goal: Patient's pain or discomfort is manageable (joint)  Outcome: Maintaining     Problem: Mobility for Total Hip Arthroplasty  Goal: Patient's mobility and activity restrictions are in accordan  Outcome: Maintaining     Problem: Safety  Goal: Patient will remain free of falls  Outcome: Maintaining

## 2021-10-20 NOTE — Progress Notes (Signed)
SW sent a secure chat to [facility] to confirm patient's referral was accepted and note placed in chart.    Lazarus Gowda, MSW, MS  [facility] 6 Social Worker  Office Phone [phone]  Pager [phone]  10/20/2021  2:10 PM

## 2021-10-20 NOTE — Progress Notes (Signed)
[facility]  Department of Orthopaedic Surgery  Post op check           Patient:Carly Higgins MRN: [id number] DOA: 10/20/2021    Procedure: L THA    S: Patient seen in PACU. AVSS. No acute events since OR. Pain well controlled. Has not yet attempted PO. Denies nausea/vomiting, shortness of breath, chest pain. Dense block remains in effect.    O:  Temp:  [36 C (96.8 F)-36.3 C (97.3 F)] 36 C (96.8 F)  Heart Rate:  [67-77] 75  Resp:  [16-24] 24  BP: (77-112)/(49-70) 99/54  No results for input(s): WBC, HGB, HCT, PLT in the last 168 hours.  No results for input(s): NA, K, CL, CO2, CREATININE, GLUCOSE in the last 168 hours.    No components found with this basename: BUN, LABGLOM, CALCIUM  No results for input(s): APTT, INR, PTT in the last 168 hours.    Exam:  NAD, AAOx3  Prevena dressing c/d/I, holding suction with minimal output in cannister  LLE:  Motor not intact hip flexion/extension, knee flex/ext, ADF/APF, toe flex/ext  No sensation to light touch over m/l/d/p foot  2+ DP/PT pulses    Imaging: AP pelvis with intact hardware, maintained alignment.      A/P: 61 y.o. female admitted on 10/20/2021 now Day of Surgery s/p L THA.  Analgesia: MM  Ice, elevate  Diet: ADAT  DVT Prophylaxis: ASA 81 mg BID  Weight-bearing status: WBAT LLE  PT/OT/OOB  Abx Periop ancef and cefadroxil x 2 weeks  Xrays reviewed  Dispo: Pending PT and pain control    Laurice Record, MD, PhD, 10/20/2021 12:33 PM

## 2021-10-20 NOTE — INTERIM OP NOTE (Signed)
Procedure performed: L THA  Planned RTOR: None  Antibiotic regimen: Periop Ancef, cefadroxil x 2 weeks  Pain Management: MM  DVT prophylaxis and duration: ASA 81 mg BID on POD1 for 28days  Weight bearing status and activity restrictions: WBAT LLE  Wound Care: Prevena dressing in place  H.O. prophylaxis: N/A  Postoperative x-rays and radiographic studies: AP pelvis in PACU  Pin Care: N/A  Diet: ADAT    Interim Op Note (Surgical Log ID: [id number])       Date of Surgery: 10/20/2021       Surgeons: Surgeon(s) and Role:     * Fransisca Connors, MD - Primary     * Laurice Record, MD, PhD - Resident - Assisting   Assistants:         Pre-op Diagnosis: Pre-Op Diagnosis Codes:     * Primary osteoarthritis of left hip [M16.12]       Post-op Diagnosis: Post-Op Diagnosis Codes:     * Primary osteoarthritis of left hip [M16.12]       Procedure(s) Performed: Procedure(s) (LRB):  LEFT POSTERIOR ARTHROPLASTY, HIP, TOTAL (Left)       Anesthesia Type: General        Fluid Totals: I/O this shift:  10/13 0700 - 10/13 1459  In: 1500 (13.2 mL/kg) [I.V.:1500]  Out: 300 (2.6 mL/kg) [Blood:300]  Net: 1200  Weight: 113.4 kg        Estimated Blood Loss: 300 mL       Specimens to Pathology:  * No specimens in log *       Temporary Implants:        Packing:                 Patient Condition: good       Findings (Including unexpected complications): See op note     Signed:  Laurice Record, MD, PhD  on 10/20/2021 at 12:12 PM

## 2021-10-20 NOTE — Anesthesia Case Conclusion (Signed)
CASE CONCLUSION  Emergence  Actions:  LMA removed  Criteria Used for Airway Removal:  Adequate Tv & RR and acceptable O2 saturation  Assessment:  Routine  Transport  Directly to: PACU  Airway:  Facemask  Oxygen Delivery:  4 lpm  Position:  Upright  Patient Condition on Handoff  Level of Consciousness:  Mildly sedated  Patient Condition:  Stable  Handoff Report to:  RN

## 2021-10-20 NOTE — Progress Notes (Signed)
HOME CARE DISCHARGE REFERRAL NOTE    The agency will call to schedule first home visit date for (CHN,PT etc) services within 24-48 hours of facility discharge, and pt/family are in agreement.    Post surgery will be staying at [facility] home - [address]  [place] [address]    The following services have been arranged with [facility] [phone]:  ______Nursing   ___x___Physical Therapy   ______Occupational Therapy  ______Speech Therapist  ______Medical Social Worker  ______Home Health Aide Evaluation  ______Home Delivered Meals    Designated caregiver (name and phone #):  Laurence Slate (BRO)   [place]   [phone] (M)    Current DME or supplies in home: Walker (rolling);Cane (straight)    Special instructions for scheduling first visit - call to arrange visit times.       The following medical equipment and/or supplies have been arranged through****** (vendor) for delivery on *******  ______walker  ______wheelchair  ______commode  ______shower chair  ______Personal Emergency Response System  ______oxygen  ______cane  ______hospital bed  ______infusion supplies  ______tube feeding supplies  ______catheter supplies  ______ostomy supplies  ______wound care supplies  ______other:       Blood draw for ****** has been arranged to be drawn on ******(date) through the following lab: *****(name/phone number) with results faxed to ___________________  Blood draw is not applicable _____      Focus of care and identified patient/caregiver teaching needs at home - Procedure: LEFT POSTERIOR ARTHROPLASTY, HIP, TOTAL (Left: Hip)      Confirmed Attending signed MD:  Dr Volanda Napoleon, MD  [phone] will sign All other home care orders  Confirmed by/ Date of confirmation:  LF called 10/6 11:46 Jean/Kim       Confirmed Ancillary signing MD:  Dr Fransisca Connors, MD will Only sign orders related to Procedure: LEFT POSTERIOR ARTHROPLASTY, HIP, TOTAL (Left: Hip)  Confirmed by:  Date of confirmation:  Fernand Parkins      Insurance Information: [facility]  Patient/family informed of home care/ DME copays  _x__not applicable  ___ Yes    Patient has been screened for Medicare as a Secondary Payer (MSP)  ___eligible for [facility]   _x__not eligible    MVA or WC case -  no    The above arrangements are based on an in-hospital evaluation. It is short-term and will be re-evaluated by the Home Health Care Nurse in the home on a regular basis, as per physician's order.       Jancy Sprankle L. Renn Dirocco  Intake LPN Home Care Coordinator  [facility]   [address]  [place], [place] [address]  O: [phone]  [email or web address]

## 2021-10-20 NOTE — Discharge Instructions (Addendum)
Surgical date: 10/20/2021 TOTAL HIP ARTHROPLASTY    Helpful Telephone Numbers:  Nurse Navigator: [phone] Monday through Friday 8 am to 4:30 pm (for questions and concerns)  After Hours: [phone] (weekends and M-F after 4:30pm) (for questions and concerns that need to be addressed after office hours)  Appointment line: [phone] make, change, or schedule an appointment)  Surgeons office: Dr. Fransisca Connors - Office Phone [phone]    You or your caregiver should call your surgeon's nurse navigator's line at [phone] for:  Redness around the incisions   Continuous drainage or bleeding from incision sites (a small amount of drainage is expected initially)  Temperature above 101 F  Pain not controlled by prescribed pain medications   Any other worrisome condition    If you feel you are having a Medical Emergency please present to the [facility] Emergency Department    You or your caregiver should call your surgeons office for any prescription refill requests.    After hours and on weekends there is always someone on call, available through the answering service [phone] to address your concerns.  Please don't hesitate to reach out with your questions.   In the event that you are unable to contact the office or the answering service, please go to [facility] Emergency Department for evaluation for urgent concerns.    CareSense Questionnaire Reminder  Two and four days after surgery, you will receive questionnaires through CareS[employer]. Please complete them both using the app or the link in that day's Care[employer] email. Your Nurse Navigators will be checking in with you as they were before surgery, and your responses will help them assess your progress    Thank you for choosing [facility] for your joint replacement.  Please adhere to the following instructions in order to maximize your recovery.    Please follow the instructions next to any check box that has  been checked i.e. [x]     Activity:  You should be Weight bearing as tolerated  on your operative extremity. Walk and stair-climb as tolerated.   Use assistive devices (walker, crutches, cane) as instructed by your Physical Therapist.   Do not lift, carry, push or pull objects heavier than 10 pounds until cleared by your surgeon.   Driving is permitted once you are functionally cleared to do so by your surgeon AND you are no longer taking narcotic pain medications.   [x]  You have no posterior hip precautions.     [x]  Please use an elevated toilet seat for six weeks after surgery.    Diet:  You may resume your previous diet, as tolerated.   Please aim for protein intake of approximately 100 grams per day to promote healing. (Meats, fish, peanut butter, and Ensure/Boost shakes are all good sources of protein.)  If you feel nauseous or develop vomiting, go back to just clear liquids, increasing to a soft diet and then regular solid foods as tolerated.  Smoking and/or nicotine use are associated with poor wound healing and increased risk of infection. Therefore it is recommended you not smoke or use nicotine for as long as possible following surgery.  If you are diabetic, it is important to keep your blood sugars well-controlled. Poorly controlled blood sugars are associated with poor wound healing and increased risk of infection.     Labs:  [x]    None required.  []    Your *** should be rechecked on ***. Results will be sent to your surgeon's office as well  as your PCP.        Preventing Blood Clots (Deep Vein Thrombosis/Pulmonary Embolism or DVT/PE):  Please wear your anti-embolism stockings for 28 days. They should be worn during the day and removed at night - unless instructed otherwise.     You will be prescribed a blood thinner to help minimize the increased risk of blood clots following surgery. Please refer to your MEDICATION LIST for details. While taking blood thinners, you may find you bruise or develop nose  bleeds more easily. You should watch for signs of gastrointestinal bleeding, which may include dark, tar-like stools or vomit with the appearance of coffee-grounds. You should call your surgeon if you notice these signs.   You have been instructed to take ELIQUIS 2.5MG  BY MOUTH TWICE DAILY FOR 6 WEEKS  for deep vein thrombosis prophylaxis (blood clot prevention).     Post-operative course:  Swelling:  It is common to have swelling up to 6 to 12 months after surgery depending on your activity.    Bruising:  It is common and normal for bruising to occur post-operatively.  Bruising can develop on your thigh, calf, foot and ankle following surgery.  Bruises can be painful to touch as they resolved and can take several weeks to disappear.  Ice & Elevation:  Icing is essential for pain/swelling reduction and should be continued as needed for months after surgery.   Apply ice for 20-30 minutes at a time, with a minimum of a 30-minute break in between the icing sessions.   Combine icing with elevation of your extremity.   Elevate your foot on pillows so your foot is above the level of your heart and your ankle is above your knee.   True elevation of the hip joint in this manner is challenging, but elevation of the extremity will help with your recovery regardless.     Compression:  Frequent use of wearing an ACE wrap is strongly encouraged to help reduce swelling post-operatively.  For 2 weeks after surgery, use an ACE wrap on your surgical leg for a maximum of 12 hours per pay.  Adjust ACE wrap as needed if it feel too tight or too loose.     Managing your pain:  Please refer to your MEDICATION LIST for detailed instructions on your personalized pain protocol.  You should apply ice for 30 minutes every 1-2 hours and elevate your extremity above the level of your heart 3 times a day and during rest to help reduce swelling and pain.  Narcotic medications are often necessary for a short time after total joint surgery. While  you are taking narcotics:  Do NOT drive or operate heavy machinery.  Do NOT drink alcohol.  Do NOT make any important personal, business, or economic decisions or sign legal documents.  Narcotics can cause side effects such as sedation, lethargy, body-wide itching, nausea, vomiting and constipation.  You should work to gradually wean off of narcotic pain medications as your pain improves, first by decreasing the dose, and then by increasing the amount of time in between doses.    It is important to dispose of all narcotics when you are certain you no longer need them. The first choice for all drug disposal is an authorized medication disposal bin, which is located at the [facility] pharmacy, most retail pharmacies, and law enforcement stations. If you are unable to access one of these bins, you should flush unused narcotics down the toilet.  Please call your surgeon's office M-F  before 4:30pm for prescription refill requests     Stomach irritation prevention:        []   While you are on Aspirin therapy to prevent blood clots, you have been instructed to take pepcid (famotidine) twice a day or a PPI (protonix, prevacid etc) daily to help reduce gastric irritation and protect   your stomach lining as aspirin can be abrasive to the stomach               []   While you are on NSAID (mobic/celebrex) therapy for pain control, you have been instructed to take pepcid (famotidine) twice a day, or a PPI (protonix, prevacid etc) daily to help reduce gastric irritation and protect your stomach lining as these medications can be abrasive to the stomach    Constipation:  After surgery you are at increased risk for constipation because of reduced movement and activity levels as well as opioids/narcotics.   To prevent/alleviate constipation, you should walk as much as you can tolerate and drink plenty of fluids. You should also use a stool softener such as Colace (docusate sodium) and a laxative such as Senokot (senna) unless  you develop diarrhea or loose stool.  These two medications have been prescribed to you.  See your MEDICATION LIST for details.    If you have not had a bowel movement by your 3rd day after surgery, please consider adding Miralax, or taking magnesium citrate to help facilitate a bowel movement. Utilize your pharmacist, or please call your PCP/surgeon's office, if you have any concerns about the addition of these medications to your current home regimen.      Caring for your incision:   PrevenaT Incision Management System- Your PREVENAT System uses negative pressure - like a vacuum - to help protect and heal your incision at the skin surface as well as below.  - Do not turn therapy unit off unless advised by the treating physician or there is bleeding, signs of infection or signs of an allergic response.  - Make sure the unit is plugged in at least nightly and as needed indicated by a solid light under the symbol in the shape of a plug and a beep every 4 minutes.  -If an alert sounds, an action is required on your part; refer to "Indicators and Alerts".   -After 7 Days the pump will no longer function, and vacuum/negative pressure will no longer be applied. At this time you should remove the special dressing completely from you skin. Cover your incision and change daily until your follow-up with your surgeon's office or your incision completely heals over.    -Please bring your pump with its canister to your follow up appointment where any output/drainage can be evaluated and the pump disposed of. You may dispose of the dressing and tubing.     Charging and duration of treatment-  -The Prevena pump will only last 7 days. The circular array of lights on the front of the pump indicate the number of days remaining in therapy.   -The Prevena pump has battery capacity but it does need to be charged. We recommending charging the pump while at rest and near an outlet, and overnight.    Bathing  Bathing in tub is not  recommended.  Do not submerge therapy unit or dressing  Light showering is permissible. Protect therapy unit from direct spray. Avoid prolonged water contact with therapy unit and dressing.  When towel drying avoid disrupting or damaging the dressing.  Cleaning  The therapy unit and carrying case can be wiped with a damp cloth using a mild household soap solution that does not contain bleach.    Sleeping  Place therapy unit in a position where tubing will not become kinked or pinched.  Ensure therapy unit cannot be pulled off table, or dropped on the floor during sleep.    Indicators and Alerts:  Visual Alerts - Flashing visual alerts will only stop when the alert condition has been corrected.  Audible Alerts - Repeated beeps (which in some cases will increase in volume) can be temporarily muted (paused) by pressing the On/Off button once. The audible alert will re-occur after 60 minutes unless the alert condition has been corrected.  Leak Alert: one beep; one solid yellow caution indicator light - correct air leak.  Canister Full Alert: two beeps; one solid yellow caution indicator light - check canister, if nearly full or full, turn unit off and call physician.  Low Battery Alert: one slow beep; one solid yellow battery level light - change batteries within 6 hours.  Low Battery Alert: one beep repeated rapidly and increasing in volume; one solid yellow battery level light - change battery immediately.  System Error Alert: one beep repeated rapidly and increasing in volume; two solid yellow lights (caution and battery) - turn unit off and then on.  Device Lifecyle Expired: a beep for 15seconds with three solid yellow lights; unit shuts itself down.    Please keep and use the Prevena care booklet for troubleshooting and additional questions- or call your surgeons office.     AFTER 1 WEEK, PLEASE REMOVE PURPLE PREVENA DRESSING AND PLACE AN AQUACEL. PLEASE READ BELOW ON INFORMATION REGARDING AQUACEL DRESSING.      AQUACEL:   Your incision is covered with a surgical dressing called Aquacel.  Please remove dressing at 14 days (2 weeks) after surgery.   If there is no drainage from the incision, the incision may be left open to air.   If there is drainage cover incision with a clean dry dressing and contact your surgeon.   If you need help removing the dressing, please discuss with your homecare services.  If your notice any drainage on the dressing that is >80% soiled, or has become mostly unattached from the skin, remove and cover with a dry dressing and tape until follow-up. Do not shower and do not allow incision to get wet  Showering:  Do not shower if you have drainage from your incision. You may sponge bathe. Do not scrub your incision.  Initially you may shower with intact dressing in place. If there's any sign that the dressing is peeling at any corner, or wrinkled, cover with plastic wrap before showering.   Do not soak your wound in a bath, whirlpool, or in any kind of standing water.   When the initial dressing is removed and if there is no drainage you may continue to shower. Pat your incision dry, no not scrub the incision site.   You do not have any surgical staples or sutures that require removal.    Follow-up:  Call your surgeon's office at [phone] to make a follow-up appointment.     Your surgeon requests that you do not receive the Flu or Covid vaccine until 4 weeks have passed since your surgical date         ANTIBIOTICS: You are being discharged on antibiotics to prevent infection after surgery. You have been prescribed Cefadroxil 500mg  by mouth every  12 hours for 14 days. Please take as directed.

## 2021-10-20 NOTE — Progress Notes (Signed)
Physical Therapy    Treatment session completed.  Cleared PT    Is another physical therapy session necessary prior to hospital discharge:  No    Discharge recommendation:  Anticipate return to prior living arrangement, Intermittent supervision/assist, Home PT  Equipment recommendations upon discharge: None  Mobility recommendations for nursing while in hospital: SBA with RW     PT Adult Assessment - 10/20/21 1915          PT Tracking    PT TRACKING PT Assigned     Type of Session follow up/treatment     SW Request? No        Treatment Day    Treatment Day (HH / URR) 1        Precautions/Observations    Precautions used Yes     Total Hip Replacement No hip precautions per MD order;Surgical Approach: Posterior lateral;Elevated toilet seat     Weight Bearing Status Left Lower Extremity - Weight bearing as tolerated     LDA Observation IV lines;VAC     Was patient wearing a mask? No     PPE worn by Clinical research associate [facility]     Fall Precautions General falls precautions;Patient educated to use call light for nursing assist prior to getting up;White board updated with appropriate mobility status        Current Pain Assessment    Pain Assessment / Reassessment Assessment     Pain Scale 0-10 (Numeric Scale for Pain Intensity)      0-10 Scale 3     Pain Location/Orientation Hip Left     Pain Descriptors Cramping     (T) Pain Intervention(s) for current pain Repositioned;Cold applied;Ambulation/increased activity        Cognition    Cognition Tested     Arousal/Alertness Appropriate responses to stimuli     Orientation Did not assess     Ability to Follow Instructions Follows all commands and directions without difficulty        Bed Mobility    Bed mobility Tested     Supine to Sit Independent     Sit to Supine Independent     Additional comments completed on mat table without rail, and also completed sit to supine on bed with use of bed rail. Patient reports she has a recliner she could sleep in at home as well        Transfers     Transfers Tested     Stand Pivot Transfers Modified  independent     Sit to Stand Modified independent (device)     Stand to sit Modified independent (device)     Transfer Assistive Device rolling walker     Additional comments instructed on transfers from recliner and mat table, verbal reminders for correct hand placement        Mobility    Mobility Tested     Weight Bearing Status Left Lower Extremity - Weight bearing as tolerated     Gait Pattern 2 point;Antalgic left side;Decreased cadence;Decreased R step length;Decreased R step height;Decreased L step length;Decreased L step height;Decreased stance time L     Ambulation Assist Modified independent (device)     Ambulation Distance (Feet) 105   x2    Ambulation Assistive Device rolling walker     Additional comments instructed on amb in room adn hallway, no loss of balance or unsteadiness noted        Family/Caregiver Training`    Patient/Family/Caregiver training Yes     Patient training Role  of physical therapy in hospital and plan for evaluation and follow up;Discharge planning;Use of assistive device;Therapeutic exercises, including recommendation of frequency of therapeutic exercises to be completed by patient throughout day;Call don't fall, purpose of bed/chair alarm, and recommendation for nursing assistance during all oob mobility        Therapeutic Exercises    Exercises Performed Functional Exercises     Additional comments provided and instructed patient on walking program and LE functional HEP handouts. Patient verbalizes understanding of walking program and demonstrates understanding of LE HEP     Supine heel slides 5x LLE on mat table     Sit to stand 5x with RW from mat table     Standing heel/toe raises 5x BLE at RW     Standing marching 5x BLE at [facility]        Balance    Balance Tested     Sitting - Static Independent;Supported     Sitting - Dynamic Independent;Supported     Standing - Static Independent;Supported        Functional Outcome  Measures    Functional Outcome Measures Yes        PT AM-PAC Mobility    Turning over in bed? None: Modified Independence/independent     Moving from lying on back to sitting on the side of the bed? None: Modified Independence/independent     Moving to and from a bed to a chair? None: Modified Independence/independent     Sitting down on and standing up from a chair with arms? None: Modified Independence/independent     Need to walk in hospital room? None: Modified Independence/independent     Climbing 3 - 5 steps with a railing? A Little: Minimum/Contact Guard Assist/Supervision     Total Raw Score 23     Standardized Score - Calculated 56.93     % Functional Impairment - Calculated 11%        Assessment    Brief Assessment Patient demonstrates adequate mobility skills to return home     Problem List Impaired LE ROM;Impaired LE strength;Impaired balance;Impaired ambulation;Impaired functional mobility;Pain contributing to impairment     Overall Assessment Patient clears PT but if she remains she would benefit from further gait training and HEP review        Plan/Recommendation    PT Treatment Interventions Restorative PT;AROM;Balance training;Pt/Family education;Gait training;Home exercise program instruction;D/C planning;Will work to minimize pain while promoting mobility whenever possible     PT Frequency 5-7x/wk     PT Mobility Recommendations SBA with RW     PT Referral Recommendations OT;SW;Home care     PT Discharge Recommendations Anticipate return to prior living arrangement;Intermittent supervision/assist;Home PT     PT Discharge Equipment Recommended None     PT Assessment/Recommendations Reviewed With: Patient;Family;Nursing     Next PT Visit Cleared PT; progress per protocol     PT needs to see patient prior to DC  No        Time Calculation    PT Timed Codes 18     PT Untimed Codes 0     PT Unbilled Time 0     PT Total Treatment 18        Plan and Onset date    Plan of Care Date 10/20/21     Onset Date  10/20/21     Treatment Start Date 10/20/21                     AMPAC Score  Interpretation:  Adapted from Daniel L. Young et al [employer] learning prediction of hospital patient need for post-acute care using admission mobility measure is robust across patient diagnoses. Health Policy and Technology, volume 12, issue 2, June 2023, 100754     Patients 66 years or older with an AM-PAC standardized score of <31 have an increased likelihood of requiring post-acute care (76%)  Patients that are less than 51 years old with an AM-PAC standardized score of <31 (41-59%) or, patients that are>71 years old with an AM-PAC standardized score >/=31 (37-50%) have a moderate likelihood of requiring post-acute care   Patients with an AM-PAC standardized score of >43 have an increased likelihood for discharge to home regardless of age. (5-12%)    Clyda Hurdle PT, DPT  Web page or secure chat Clyda Hurdle

## 2021-10-20 NOTE — Progress Notes (Signed)
HOME HEALTH REFERRAL REVIEW - please submit the Ref 34 service request order on the day of discharge.      Completed by: Eveny Anastas L. Palmer Fahrner  Intake LPN Home Care Coordinator    Referred by:  Jennifer Sanz       Source of Information: Medical record       Home Health indicators present: Pt will need follow up at home       Barriers to discharge to be addressed:  Will review       Question Patient?   No     Plan:  Writer reviewed chart. [facility] will continue to follow hospital course and arrange appropriate services at time of discharge.     Thank you.  Sharnay Cashion L. Vivan Agostino  Intake LPN Home Care Coordinator  [facility]   [address]  [place], [place] [address]  O: [phone]  [email or web address]  [email or web address]

## 2021-10-20 NOTE — Anesthesia Procedure Notes (Signed)
---------------------------------------------------------------------------------------------------------------------------------------    NEURAXIAL BLOCK PLACEMENT  Single Shot Spinal    Date of Procedure: 10/20/2021 10:29 AM    Patient Location:  OR    Reason for Block: at surgeon's request    CONSENT AND TIMEOUT     Consent:  Obtained per policy    Timeout: patient identified (name/DOB) , nerve block procedure/site/side verified by patient or family, proper patient position verified, needed equipment, monitors, medications and access verified as present and functioning , allergies reviewed with patient/record  and anticoagulation/antiplatelet status reviewed    Staff involved in Time Out (excluding authors of note):  Lexy, RN  METHOD:    Patient Position: sitting    Monitoring: blood pressure and continuous pulse oximetry      Labeling: syringes appropriately labeled    Sedation Used: yes            For medications used, please see MAR        Level of Sedation: none      Sterile Technique:  Hand sanitizing, hat, sterile gloves, mask and hand washing    Prep: aseptic technique per protocol and povidone-iodine      Successful Approach: midline    Successful Location: L4-5    Attempts (Skin Punctures):  1  SPINAL NEEDLE:      Introducer: 20 G    Type: Pencan    Gauge: 25 G    Length: 3.5 in    CSF Appearance: clear  OBSERVATIONS:    Block Completion:  Successfully completed      Wet Tap: No      Paresthesia: none      Neuraxial Blood: blood not aspirated      Sensory Levels: bilateral        Motor Block: dense      Patient Reaction to Block: tolerated procedure well and vitals remained stable    ADDITIONAL COMMENTS:  LOT [id number]  EXP 12/08/23  STAFF     Performed by  Duane Boston, DO  ----------------------------------------------------------------------------------------------------------------------------------------

## 2021-10-20 NOTE — Op Note (Addendum)
Operative Note      DATE OF SURGERY: 10/20/2021     SURGEON: Fransisca Connors, MD    ASSISTANTS: Laurice Record, MD RES; Radene Gunning, [place]    PRE-OPERATIVE DIAGNOSIS:  Left hip end-stage degenerative joint disease    POST-OPERATIVE DIAGNOSIS:  Left hip end-stage degenerative joint disease    PROCEDURE PERFORMED:  Primary cementless left total hip arthroplasty    ANESTHESIA:  General anesthesia, intrathecal analgesia    INTRAVENOUS FLUIDS:  Per anesthesia record    ESTIMATED BLOOD LOSS:  300 mL    DRAINS:  None    SPECIMEN:  None     COMPLICATIONS:  None    IMPLANTS USED:   1. 52 mm Depuy Emphasys with Gription highly porous cluster hole acetabular component  2. Depuy ALTRX LD highly cross linked polyethylene liner, inner diameter 36 mm, +0 neutral offset  3. Depuy Actis highly porous proximally coated with full Duofix HA coating femoral component, size 6, standard offset  4. Depuy Biolox Delta ceramic head size 36 mm with +5 mm neck for 12/14 taper    INTRAOPERATIVE FINDINGS: The intra-operative finding confirmed the clinical and radiographic finding of end-stage osteoarthritis characterized by complete cartilage loss on the femoral head and acetabulum with presence of osteophytes.     HISTORY AND INDICATIONS:   Carly Higgins is a 61 year old female with a long-standing history of severe left hip degenerative joint disease, refractory to conservative management. The patient has progressive and debilitating hip pain secondary to end-stage osteoarthritis, supported by clinical and radiographic evidence. After understanding risks, benefits, and alternatives to surgical management, she elected to proceed with left total hip arthroplasty.  Informed consent was signed and completed by the patient. The patient was seen and thoroughly evaluated by their primary care physician and the presurgical testing team coordinated by the anesthesia department, and clearance was obtained to proceed with the elective procedure.    SURGICAL  PROCEDURE:  The patient was identified in the preoperative holding area.  Once again, risks, benefits, and alternatives to surgery were briefly reviewed.  The left hip was marked as the correct operative site. All final questions were addressed at this time. The patient was seen by the anesthesiologist who determined it would be safe to proceed with surgery. Intravenous tranexamic acid and antibiotics per the preoperative plan were administered.    The patient was transferred to the operating room where intrathecal and general anesthesia was satisfactorily applied. The patient was then positioned in the lateral decubitus position so that the left hip was facing upwards using pubic and sacral positioners to securely stabilize the pelvis.  Bony prominences and soft tissues around the positioners were well padded. An axillary roll was positioned beneath the down chest wall to avoid neural injury, and TED stocking and SCDs placed on the non-operative leg for mechanical DVT prophylaxis during the procedure.  The non-operative leg was gently flexed at the hip and knee also to avoid neural injury in a position of comfort. The down arm was positioned on a well-padded arm board, and contralateral arm positioned on padding and pillows above in a position of comfort.    The left leg was then prepped using chlorhexidine pre-scrub followed by chlorhexidine applicator sticks. The left leg was then draped in the usual sterile fashion.    A surgical pause and timeout was held confirming the correct patient via identifiers, procedure to be performed, that we were operating on the correct operative site, that preoperative antibiotics and tranexamic  acid were administered, that all appropriate implants were available for the procedure, that all necessary imaging was available for the procedure, and that all safety precautions were being utilized.    To begin the procedure, a skin incision was planned at the posterior one third of the  greater trochanter and slightly curvilinear posteriorly towards the PSIS.  Distally, the incision was planned in line with the axis of the proximal femur.  The skin incision was made sharply using a #10 scalpel.  The subcutaneous tissue was then dissected sharply to the level of the fascia lata and iliotibial band.  The fascia lata was incised in line with the femur and the fibers of the gluteus maximus were carefully split with blunt dissection.  A Charnley retractor was then carefully placed retracting the fascia lata and gluteus maximus fibers. The short external rotators were identified by gently sweeping the overlying adipose tissue posteriorly. The sciatic nerve was palpated well posterior to the surgical field and protected throughout the procedure.  The interval between the piriformis tendon and the posterior edge of gluteus minimus was defined with a Cobb elevator.  The gluteus minimus muscle was retracted with a Deaver retraction, exposing the posterosuperior hip capsule.  The short external rotators and capsule were divided at their insertion on the posterior trochanter, lateral femoral neck and superior acetabulum.  The external rotators and capsule were tagged with #2 Ethibond suture, taken down as one posterior sleeve in a trapezoidal shape and served to retract and protect the sciatic nerve throughout the remainder of the case.  The predislocation leg-length relationship was noted with the knees colinear and measured at the superimposed heels.  The inferior hip capsule was released off the medial neck and the hip was carefully dislocated without difficulty via hip flexion, adduction and internal rotation.    A Cobra retractor was placed around the medial femoral neck to protect the soft-tissue.  The superior lateral shoulder between the femoral neck and greater trochanter was then cleared of any remaining soft tissue debris.  The proposed neck cut was marked at the previously templated length  referencing the lesser trochanter.  The femoral neck resection was then performed using an oscillating saw.  This was completed at the lateral shoulder using caution to avoid trochanteric fracture.  The leg was then positioned in slight internal rotation and gentle flexion to prepare the acetabulum. An anterior acetabular retractor was carefully placed over the anterior acetabular rim just proximal to the equator and a posterior retractor was placed safely in the ischium using caution to retract soft tissues posteriorly to protect the sciatic nerve.  Both retractors were placed in the interval outside the labrum, yet within the hip capsule to avoid any neurovascular injury.   The pulvinar and labrum were excised with Bovie electrocautery to maintain hemostasis.  We then began reaming the acetabulum with hemispherical reamers in a sequential fashion by 2 mm increments to obtain circumferential coverage and an interference fit at a size of 52 mm.  This provided a healthy bleeding subchondral bed at a depth consistent with preoperative templating which would enable a size 52 mm component to achieve an excellent press fit.  The acetabulum was irrigated with pulsavac lavage and any exposed degenerative cysts were curetted and filled with autologous bone graft.  The final highly porous coated hemispherical acetabular component was passed onto the back table in sterile fashion and impacted into place with approximately 20 degrees of anteversion and 45 degrees of inclination, achieving adequate press-fit  and stability.  Full seating of the component was confirmed by checking through the dome hole.  Peri-acetabular osteophytes were resected using osteotomes to remove any extra-articular sources of impingement.  The final highly cross-linked acetabular liner was passed onto the back table in sterile fashion and impacted into place, with locking tabs noted to be fully engaged.  Acetabular retractors were removed carefully, and  we turned our attention to preparation of the femur.     Once again the hip was flexed, internally rotated, and adducted to allow exposure of the proximal femur.  A femoral elevator was used at the posteromedial aspect of the femur to provide direct access to the intramedullary space, Cobra retractors were positioned around the proximal femur to protect the soft tissues based on adequacy of visualization.  The lateral shoulder was carefully cleared of any residual piriformis tendon and capsular tissue.  A box cutting osteotome was used to open the intramedullary space.  A T-handle canal finder was then used to complete opening of the intramedullary space in line with the femur and suction of the canal was performed for decompression.  We then began broaching in sequential fashion to a size 6 broach in slight anteversion relative to native anteversion.  Throughout this process, we were careful to lateralize the broach stem so as to avoid varus malalignment of the final implant.  The final broach was felt to achieve excellent axial and rotational stability and mediolateral metaphyseal fit, so we turned our attention toward trial reduction of components.  The trial head-neck assembly was attached to the broach and the hip was carefully reduced.  The hip was assessed for leg-length based on the predislocation position of the heels with the knees colinear and felt to be satisfactory according to preoperative templating.  The various offset necks and head lengths were utilized to optimize the myofascial tension and minimize extraarticular bony impingement while maintaining the appropriate leg-length, stability and range of motion.  Combined version with the trial implants in was appropriate.  The hip was carefully dislocated with the aide of a T-handled bone hook and the appropriate head-neck trial was removed.  The femoral component rotation was noted and observed to be approximately colinear with the native femoral neck  while optimizing anteversion as able to the limits of the anatomy without compromising implant stability.      Femoral retractors were repositioned and the proximal femur was exposed once again.  Trial implants were removed and the femoral canal was irrigated using pulsatile lavage.  The final size 6 stem was impacted into place with excellent axial and torsional stability. A final trial reduction was performed before taking the final femoral head.  Once again, there was excellent stability through range of motion and restoration of leg lengths.  The final femoral head was then impacted onto the clean and dry Morse taper.  The acetabular liner was irrigated with pulsatile lavage and cleared of any debris. The hip was then carefully reduced.  Leg length restoration appeared satisfactory based on the intraoperative landmarks to replicate the preoperative templating and was confirmed with the heel measurement on the table with the knees colinear.  The hip was stable to full hip extension and external rotation to the limits of the anterior capsule and soft-tissues without any prosthetic impingement.  The hip was stable in the position of sleep, stable in neutral rotation with the knee to the chest and stable with 90-degrees hip flexion and internal rotation to 80 degrees.    A  dilute Betadine soak was performed for 3 minutes.  The sleeve of short external rotators and capsule was reattached in anatomic fashion to the undersurface of the greater trochanter and posterior femur with heavy braided suture through drill holes.  The superior capsular edge was approximated to the posterior edge of the abductors with a #1 vicryl suture.  The sciatic nerve was gently palpated along its length and found to be mobile and not under excessive tension or tethering.  Adequate hemostasis was obtained with minimal active bleeding.  The fascial layer was then closed using interrupted figure-of-8 #1 Vicryl suture and a running #1 barbed  monofilament suture.  The dermal layer closed using a running 2-0 barbed monofilament suture, and the epidermal layer closed using skin staples.  A sterile Prevena incisional wound VAC dressing was applied.    Following the procedure, Ms. Richarson was satisfactorily awakened from sedation and transferred to the postanesthesia care unit in stable condition. As the attending surgeon, I was physically present and participated during all critical aspects of the above surgical procedure.    Fransisca Connors, MD  Assistant Professor, Division of Adult Reconstruction  [facility]  Department of Orthopaedics and Rehabilitation

## 2021-10-20 NOTE — Progress Notes (Signed)
Report Given To   Lamont Dowdy RN      Descriptive Sentence / Reason for Admission   LEFT POSTERIOR ARTHROPLASTY, HIP, TOTAL         Active Issues / Relevant Events   Patient received Bupivacaine spinal.   Has sensation at level L4  Patient in on BG protocol. BG 114 in PACU. No treated/  Last pain score 0  Oxygen is 99% on RA  Medications administered none  Comorbidities include obesity, left breast CA, former smoker (quit July 2023), snoring, GERD, chronic nausea r/t ozempic use, depression, peripheral neuropathy, DM II, hypothyroid, chemo hx, HLD  Xray complete in PACU   Bladder scanned for:221 ml           To Do List  PT/OT  WBAT  No hip precautions      Anticipatory Guidance / Discharge Planning

## 2021-10-21 ENCOUNTER — Other Ambulatory Visit: Payer: Self-pay

## 2021-10-21 ENCOUNTER — Encounter: Payer: Self-pay | Admitting: Orthopedic Surgery

## 2021-10-21 ENCOUNTER — Non-Acute Institutional Stay: Payer: Medicare Other | Attending: Internal Medicine

## 2021-10-21 LAB — POCT GLUCOSE
Glucose POCT: 208 mg/dL — ABNORMAL HIGH (ref 60–99)
Glucose POCT: 114 mg/dL — ABNORMAL HIGH (ref 60–99)

## 2021-10-21 MED ORDER — ACETAMINOPHEN 500 MG PO TABS *I*
1000.0000 mg | ORAL_TABLET | Freq: Three times a day (TID) | ORAL | 0 refills | Status: DC
Start: 2021-10-21 — End: 2022-06-08
  Filled 2021-10-21: qty 100, 17d supply, fill #0

## 2021-10-21 MED ORDER — SENNOSIDES 8.6 MG PO TABS *I*
2.0000 | ORAL_TABLET | Freq: Every evening | ORAL | 0 refills | Status: DC
Start: 2021-10-21 — End: 2021-11-17
  Filled 2021-10-21: qty 100, 50d supply, fill #0

## 2021-10-21 MED ORDER — APIXABAN 2.5 MG PO TABS *I*
2.5000 mg | ORAL_TABLET | Freq: Two times a day (BID) | ORAL | 0 refills | Status: DC
Start: 2021-10-21 — End: 2022-06-08
  Filled 2021-10-21: qty 60, 30d supply, fill #0

## 2021-10-21 MED ORDER — DOCUSATE SODIUM 100 MG PO CAPS *I*
200.0000 mg | ORAL_CAPSULE | Freq: Every day | ORAL | 0 refills | Status: DC
Start: 2021-10-21 — End: 2021-11-17
  Filled 2021-10-21: qty 100, 50d supply, fill #0

## 2021-10-21 MED ORDER — OXYCODONE HCL 5 MG PO TABS *I*
5.0000 mg | ORAL_TABLET | ORAL | 0 refills | Status: DC | PRN
Start: 2021-10-21 — End: 2021-11-17
  Filled 2021-10-21: qty 42, 4d supply, fill #0

## 2021-10-21 MED ORDER — ASPIRIN 81 MG PO TBEC *I*
81.0000 mg | DELAYED_RELEASE_TABLET | Freq: Every day | ORAL | Status: DC
Start: 2021-10-21 — End: 2022-09-12

## 2021-10-21 NOTE — Progress Notes (Addendum)
[facility]  Department of Orthopaedic Surgery  Progress Note           Patient:Carly Higgins MRN: [id number] DOA: 10/20/2021    Procedure: L THA    S: AVSS. No acute events overnight. Pain well controlled. Tolerating PO. +voiding. Denies nausea/vomiting, shortness of breath, chest pain. Denies new numbness/tingling. Worked with PT, will require additional sessions.    HCT 33    O:  Temp:  [36 C (96.8 F)-36.6 C (97.9 F)] 36.6 C (97.9 F)  Heart Rate:  [63-86] 86  Resp:  [15-24] 18  BP: (77-130)/(49-78) 120/72  Recent Labs   Lab 10/20/21  1616   Hemoglobin 10.7*   Hematocrit 33*     Recent Labs   Lab 10/20/21  1616   Sodium 137   Potassium 4.3   Chloride 99   CO2 24     No components found with this basename: BUN, LABGLOM, CALCIUM    No results for input(s): APTT, INR, PTT in the last 168 hours.    Exam:  NAD, AAOx3  Prevena dressing c/d/I, holding suction   LLE:  Motor intact hip flexion/extension, knee flex/ext, ADF/APF, toe flex/ext  SILT m/l/d/p foot, including 1st DWS  2+ DP/PT pulses    Imaging: AP pelvis with intact hardware, maintained alignment.      A/P: 61 y.o. female admitted on 10/20/2021 now 1 Day Post-Op s/p L THA.  Analgesia: MM  Ice, elevate  Diet: ADAT  DVT Prophylaxis: ASA 81 mg BID x 28 days  Weight-bearing status: WBAT LLE  PT/OT/OOB  Abx Periop ancef and cefadroxil x 2 weeks  Xrays reviewed  Dispo: Pending PT and pain control    Laurice Record, MD, PhD, 10/21/2021 4:55 AM    I saw and evaluated the patient. I agree with the resident's/fellow's/APP's findings and plan of care as documented above.     Fransisca Connors, MD  Division of Adult Reconstruction  [facility]  Department of Orthopaedics and Rehabilitation

## 2021-10-21 NOTE — Progress Notes (Signed)
Occupational Therapy    Initial Eval Completed.  Patient is a 61 y.o.female who presents s/p L THA by Dr. Arlyce Dice on 10/20/2021. LLE WBAT with no hip precautions (elevated toilet seat for 6 weeks). OT evaluated pt POD #1, cleared by OT.     Does OT need to see patient PRIOR to D/C: No    Discharge recommendation:   Prior Living Environment, Intermittent supervision (assist with ADLs/IADLs PRN)   Equipment recommendations:  (none)   Hospital Stay Recommendations for nursing: 1A RW to bathroom, encourage participation in ADLs    Past Medical History:   Diagnosis Date    Atrial fibrillation     fib/flutter 05/2015    Breast cancer     CHF (congestive heart failure)     pulmonary edema 05/28/15    Diabetes mellitus 08/23/2020    GERD (gastroesophageal reflux disease)     Hypothyroidism 06/16/2021    Hypoxia 06/11/2015    Impaired mobility and ADLs 06/11/2015    Leukocytosis 09/22/2013    Lumbar radiculopathy 01/11/2015    Osteoarthrosis 06/16/2021    post allogenic MUD (F)  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    Conditioned w/ Busulfan 130 mg/m2/d and Fludarabine 40 mg/m2/d x4 days followed by 10/10 HLA MUD (F) Allogeneic PBSCT (pt/donor: ABO: A+/A+, CMV N/N)    Pulmonary edema     Shortness of breath     Tobacco use        Past Surgical History:   Procedure Laterality Date    BONE MARROW TRANSPLANT  2017    Mediport insertion      PR AXILLARY LYMPHADENECTOMY COMPLETE Left 01/24/2021    Procedure: sentinel lymph node biopsy;  Surgeon: Sheliah Mends, MD;  Location: [facility] MAIN OR;  Service: Oncology General    PR MASTECTOMY PARTIAL Left 01/24/2021    Procedure: LEFT NEEDLE LOC PARTIAL MASTECTOMY   ;  Surgeon: Sheliah Mends, MD;  Location: [facility] MAIN OR;  Service: Oncology General    ROTATOR CUFF REPAIR Right        *Bold Indicates co-morbidities affecting treatment and recovery    Occupational Profile relating to the present problem:  Lives alone    In addition to the PMH and surgical history listed above, the comorbidities  affecting treatment/ recovery:   none noted    Performance deficits that result in activity limitations and/or performance restrictions:  N/A    Modification of tasks needed or assistance with assessments necessary to enable completion of evaluation:  Pt was MOD I for all tasks    Patient complexity:  low level as indicated by above personal factors, environmental factors and comorbidities in addition to their impairments found on physical exam.       OT Assessment (HH / URR) - 10/21/21 0746          OT Tracking  (HH / URR)    OT Tracking (HH / URR) OT Discontinue     Type of Session evaluation     SW Request? No        OT Last Visit    Visit (#)  0        Precautions    Precautions used Yes     Total Hip Replacement NO HIP PRECAUTIONS-Per MD Orders   elevated toilet seat for 6 weeks    Weight Bearing Status Left Lower Extremity - Weight bearing as tolerated     Fall Precautions General falls precautions;Visitor present   mother present during session  LDA Observation VAC     Patient Wearing Mask No     Writer wearing PPE including Gloves;Mask     Activity Order Activity as tolerated        Home Living (Prior to Admission)    Prior Living Situation Reported by patient;Reported by family;Obtained via chart review     Type of Home 2 Story home     # Steps to Enter Home 0   ramp    # Of Steps In Home 0   first floor set up    Location of Bedroom First Floor Set Up     Location of Bathroom First Floor Set Up     Bathroom Shower/Tub Walk-in shower   with small lip    Current Home Equipment Cane (straight);Commode (3 in 1);Electric Scooter;Grab bars around toilet;Grab bars in shower/tub;Ramp;Reacher;Sock aid;Walker (rollator);Walker (rolling)     Additional Comments above information reflects mothers home set up where pt will be staying on the first floor        Prior Function    Prior Function Reported by patient;Reported by family;Obtained during chart review     Level of Independence Independent with ADLs;Independent  with ADL functional transfers;Independent ambulation;Independent with homemaking with ambulation;Driving     Lives With Alone     Receives Help From Independent     IADL Independent     Additional Comments pts mother can assist upon d/c PRN        Current Pain Assessment    Pain Assessment / Reassessment Assessment     Pain Scale 0-10 (Numeric Scale for Pain Intensity)      0-10 Scale 8     Pain Location/Orientation Hip Left     (T) Pain Intervention(s) for current pain Repositioned;Cold applied        Vision     Current Vision No visual deficits        Cognition    Cognition No deficit noted     Level of Alertness Appropriate responses to stimuli     Orientation Alert and oriented x4     Attention  Appears intact     Memory Appears intact     Following Commands Follows one step commands 100% of the time        Medication Management    Medication Management System Yes     Uses Pill box        Perception    Perception No deficit noted        Coordination    Coordination Additional Comments     Additional comments WFL        Sensation    Sensation No apparent deficit        UE Assessment    UE Assessment Full AROM RUE;Full AROM LUE     Additional Comments WFL        Bed Mobility    Supine to Sit Modified Independent;HOB elevated     Sit to Supine Not tested     Additional Comments pt left in recliner at end of session        Functional Transfers    Sit to Stand Modified independent;Rolling walker     Stand to Sit Modified independent;Rolling walker     Toilet Transfers Modified Independent;Rolling Walker     Functional Mobility Modified Independent;Rolling Walker     Additional Comments pt was MOD I for all transfers using RW, ambulated within room and bathroom and completed a toilet transfer using grab bars  Balance    Sitting - Static Independent      Sitting - Dynamic Independent     Standing - Static Independent     Standing - Dynamic Independent     Standing Tolerance during Functional Task good      Additional Comments no LOB noted        ADL Assessment    LE Dressing Modified independent;Set up     Assist Needed With: Setup;Increased time;Use of adaptive equipment     Where  LE Dressing Assessed Edge of bed;Standing     Equipment Used Reacher;Sock aid;Walker     Additional Comments therapist educated pt and mother on compression stocking purpose and wearing schedule, demonstrated zip lock bag technique for donning. pts mother states she can assist with these at home. pt doffed sock using reacher and donned using sock aid, has AE at home. pt able to don underwear, pants and shirt on her own with increased time and managed wound vac independently.        Activity Tolerance    Endurance Tolerates 30 min activity with multiple rests     Additional Comments no SOB noted, pt took breaks throughout session        Functional Outcomes Measures    Functional Outcome Measures Yes        OT AM-PAC Self Care    Putting on and taking off regular lower body clothing? 3     Bathing (including washing, rinsing, drying)? 3     Toileting, which includes using toilet, bedpan, or urinal? 4     Putting on and taking off regular upper body clothing? 4     Taking care of personal grooming such as brushing teeth? 4     Eating Meals 4     Total Raw Score 22     CMS Score - Calculated 25.80%        Additional Comments    Additional Comments pt pleasant and willing to participate in session, states no concerns for returning home        OT Treatment Assessment    Assessment D/C acute OT        Plan    OT Frequency One-time visit     No acute OT needs Pt demonstrates adequate ADL skills for return to prior living environment;No acute OT goals identified        Recommendation    OT Discharge Recommendations Prior Living Environment;Intermittent supervision   assist with ADLs/IADLs PRN    OT Discharge Equipment Recommended --   none    OT Hospital Stay Recommendations 1A RW to bathroom, encourage participation in ADLs     OT Next Visit n/a      OT needs to see patient prior to DC  No        Multidisciplinary Communication    Multidisciplinary Communication Patient, mother, PT, RN        Patient/Family/Caregiver Training    Patient/Family/Caregiver training Yes     Patient/Family/Caregiver training Role of occupational therapy in hospital and plan for evaluation;Discharge planning;OT plan of care after evaluation;Post-operative precautions;Purpose of and importance of icing and recommendations for frequency;Use of Adaptive equipment;Benefit of OOB activity and participation in ADLs while inpatient;Compression stocking education and wearing schedule;Energy conservation techniques;Call don't fall, purpose of bed/chair alarm, and recommendations for nursing        Time Calculation    OT Timed Codes 0     OT Untimed Codes 32     OT Unbilled  Time 0     OT Total Treatment 32        Plan and Onset date    Plan of Care Date 10/21/21     Onset Date 10/20/21     Treatment Start Date 10/21/21                 Westley Gambles, OTR/L  Secure chat or web page: Westley Gambles      AMPAC Score Interpretation:  Adapted from Roxanne Gates, et al Association of AM-PAC "6 Clicks" Basic Mobility and Daily Activity Scores with discharge destination, 2021  AM-PAC Raw Score of > 19 (standardized score > 40.22 %) indicates a high likelihood of discharge to home     Adapted from [employer] al "Validity of h AM-PAC "6- Clicks" Inpatient Daily Activity and Basic Mobility Short Forms (2014)   AM-PAC raw score  ? 18 (standardized score ? 39, % impairment  < 47%) indicates a high probability of a discharge to home  AM-PAC raw score  ? 13 (standardized score ? 32.03, % impairment 63%) indicates pt is more likely to require IRF/SNF R  AM-PAC raw score <12 (standardized score < 30.6 % impairment > 66%) indicates pt is more likely to require Long Term Care

## 2021-10-21 NOTE — Discharge Summary (Signed)
Name: Carly Higgins MRN: [id number] DOB: [birth year]     Admit Date: 10/20/2021   Date of Discharge: 10/21/2021     Patient was accepted for discharge to   [facility] [6]           Discharge Attending Physician: Fransisca Connors, MD      Hospitalization Summary    CONCISE NARRATIVE: [name] was admitted for elective arthroplasty. Their post operative course was without significant events. Patient was discharged to home         OR PROCEDURE: Left Total Hip Arthroplasty 10/20/2021               SIGNIFICANT MED CHANGES: Yes  Chemical DVT prophylaxis was initiated with eliquis. Detailed in [facility]. Analgesics tailored to tolerance and adequate pain management.    Cefadroxil 500mg  PO BID x 2 wks       Signed: Joan Flores, NP  On: 10/21/2021  at: 8:36 AM

## 2021-10-21 NOTE — Progress Notes (Signed)
Patient has cleared PT, OT, is voiding spontaneously, and has had x-ray completed.  Patient has achieved adequate pain control and is amenable to discharge.  Discharge instructions reviewed with patient and patient stated understanding.  IV removed.  Patient discharged to home as per order. Aly Hauser O'Mara, RN

## 2021-10-21 NOTE — Plan of Care (Signed)
Discussed with Dr. Arlyce Dice that this patient will be on Eliquis 2.5 mg BID x 6 weeks versus standard aspirin due to being higher risk with active treatment of breast cancer currently.    Joan Flores, NP    10/21/21  7:42 AM    If you need assistance please page the ortho on-call pager (# [phone])

## 2021-10-21 NOTE — Plan of Care (Signed)
As a licensed physician I herby certify this patient's eligibility for home health services:    The patient needs intermittent Skilled Nursing care and Physical Therapy    The patient is confined to the home (that is, homebound)    - in need the aid of supportive devices such as crutches, canes, wheelchairs, and walkers    - needs  the assistance of another person to leave their place of residence   - Leaving home requires a considerable and taxing effort    Absences from the home are one or more of the following:   -infrequent;  ?for periods of relatively short duration;  ?for the need to receive health care treatment;  ?for religious services;  ?to attend adult daycare programs; or  ?for other unique or infrequent events (e.g., funeral, graduation, trip to the barber).    A plan of care has been established and will be periodically reviewed by me, a licensed physician    Services will be furnished while the individual  is under the my care     This is a face-to-face encounter during this patient's current hospital admission for joint replacement. The certification period begins at the time of hospital discharge.    Carly Conkright, MD  Division of Adult Reconstruction  [facility]  Department of Orthopaedics and Rehabilitation

## 2021-10-23 ENCOUNTER — Other Ambulatory Visit: Payer: Self-pay

## 2021-10-23 ENCOUNTER — Encounter: Payer: Self-pay | Admitting: Orthopedic Surgery

## 2021-10-23 ENCOUNTER — Non-Acute Institutional Stay: Payer: Medicare Other

## 2021-10-23 NOTE — Telephone Encounter (Signed)
The following are the patient's responses to the POD # 2 CARESENSE QUESTIONS    Have you reviewed your discharge instructions?  Yes     Are you tolerating food and drinks?  Yes     Have you had a bowel movement since your surgery?  Yes     Is your pain being tolerated well with pain medication?  Yes     Are you wearing your white stockings/TEDs/compression stockings?  Yes     Are you able to manage your surgical dressing on your own?  Yes     Is there drainage from your incision that's covering more than 50 percent of your dressing?  No     Are you taking your blood thinners? Examples: aspirin, Lovenox, Coumadin, Xarelto, and Eliquis.  Yes     Are you walking every hour while awake?  Yes     Instructed patient to call the Orthopedic Nurse Navigators with any questions or concerns

## 2021-10-23 NOTE — Initial Assessments (Signed)
Assessment findings/ Changes from previous visit: Pt presents s/p L THA. She is staying at her mother's house, there is a ramp to enter her home. Pt's home is on the second floor and she has an elevator and stairs to access second floor. She was driving prior to surgery, had not been working 2017 related to cancer treatments.  At home she used a cane independently, used a scooter to walk her dog outside. 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  Pt will return home when she is more independent with ambulation and ADLs as she lives alone in her apartment.   Skilled Need/Focus of Care:  Pt requires skilled intervention in ambulation, strengthening, and fall prevention s/p L THA.  Discussed high risk medications, pt did not have any questions and was able to appropriately state that she is not to take aspirin as she is taking eliquis at the moment.  Will continue to monitor awareness of high risk medications and appropriate usage.   Caregiver availability and willingness: Pt's mother is able to assist as needed  Discharge plan:  Until goals met or max functional potential achieved, approx 3-4 weeks  Plan for next visit: assist pt with Prevena pump removal, ambulation, strengthening.

## 2021-10-23 NOTE — Anesthesia Postprocedure Evaluation (Signed)
Anesthesia Post-Op Note    Patient: Carly Higgins    Procedure(s) Performed:  Procedure Summary  Date:  10/20/2021 Anesthesia Start: 10/20/2021  9:57 AM Anesthesia Stop: 10/20/2021 12:09 PM Room / Location:  H_OR_10 / [facility] MAIN OR   Procedure(s):  LEFT POSTERIOR ARTHROPLASTY, HIP, TOTAL Diagnosis:  Primary osteoarthritis of left hip [M16.12] Surgeon(s):  Fransisca Connors, MD  Laurice Record, MD, PhD Responsible Anesthesia Provider:  Duane Boston, DO         Recovery Vitals  BP: 95/54 (10/21/2021  8:00 AM)  Heart Rate: 86 (10/20/2021 11:37 PM)  Heart Rate (via Pulse Ox): 78 (10/21/2021  8:00 AM)  Resp: 16 (10/21/2021  8:00 AM)  Temp: 36.6 C (97.9 F) (10/20/2021 11:37 PM)  SpO2: 97 % (10/21/2021  8:00 AM)  O2 Flow Rate: 0 L/min (10/20/2021  2:16 PM)   0-10 Scale: 8 (10/21/2021  7:46 AM)    Anesthesia type:  general  Complications Noted During Procedure or in PACU:  None   Comment:      Level of Consciousness:    Recovered to baseline, awake, alert and oriented  Patient Participation:     Able to participate  Temperature Status:    Normothermic  Oxygen Saturation:    Within patient's normal range  Cardiac Status:   within patient's normal range  Fluid Status:    Stable  Airway Patency:     Yes  Pulmonary Status:    Stable  Neuraxial Block Evaluation:    No residual motor or sensory symptoms  Pain Management:    Adequate analgesia  Nausea and Vomiting:  None    Post Op Assessment:    Tolerated procedure well  Responsible Anesthesia Provider Attestation:  All indicated post anesthesia care provided  Comments:    Per chart review     -

## 2021-10-25 ENCOUNTER — Telehealth: Payer: Self-pay | Admitting: Internal Medicine

## 2021-10-25 NOTE — Telephone Encounter (Signed)
L hip surgery  Gettingaround okay-getting better daily.  Staying with mom for a bit.  Getting PT at home  Come mon-coming fri again for PT  Next fri sees np -Scott for ortho follow up./kk

## 2021-10-25 NOTE — Telephone Encounter (Signed)
Seen and agree  Orval Dortch G CAHN-HIDALGO, MD

## 2021-10-26 ENCOUNTER — Other Ambulatory Visit: Payer: Self-pay

## 2021-10-26 ENCOUNTER — Other Ambulatory Visit: Payer: Self-pay | Admitting: Hematology

## 2021-10-26 ENCOUNTER — Other Ambulatory Visit: Payer: Medicare Other

## 2021-10-27 ENCOUNTER — Other Ambulatory Visit: Payer: Self-pay

## 2021-10-27 ENCOUNTER — Other Ambulatory Visit: Payer: Medicare Other

## 2021-10-27 NOTE — Progress Notes (Signed)
Assessment findings/ Changes from previous visit: Pt reports she is doing well, walked up and down driveway with walker and was sore afterwards.  Pt continues with neuropathy in feet which interupts her ability to complete certain exercises. She is planning to go out later with her mom driving to her apartment to get her mail and check in.  Pt reports no falls.  She also states she does not have any questions regarding her medication, limited use of oxycodone and is not taking tylenol because of side effects. Pt took a shower with her mother assisting today.  She reports her shoulders hurt from using the walker. Pt anticipates returning to her own home at the end of next week after her follow up with ortho.  Prevent pump removed today without difficulty, image taken of incision prior to aquacel bandage being placed over incision.   Skilled Need/Focus of Care: Pt requires skilled intervention in ambulation, strengthening, and fall prevention s/p L THA.    Caregiver availability and willingness:  Pt's mother is available to assist as needed.    Discharge plan:  Until goals met or max functional potential achieved, approx 3 weeks  Plan for next visit: gait training, strengthening, balance training.

## 2021-10-31 ENCOUNTER — Other Ambulatory Visit: Payer: Self-pay

## 2021-10-31 ENCOUNTER — Other Ambulatory Visit: Payer: Medicare Other

## 2021-10-31 NOTE — Progress Notes (Signed)
Assessment findings/ Changes from previous visit: Pt reports she is doing well, has been outside to walk this morning.  She decided she will stay one more week at her mother's house then return to her home. Does not report any falls.   Skilled Need/Focus of Care: Pt requires skilled intervention in ambulation, strengthening, fall prevention s/p L THA. Discussed safety with progression to cane and how to adjust cane appropriately. Good teach back noted.   Caregiver availability and willingness: Pt staying with mother who can provide assist as able.   Discharge plan: Until goals met or max functional potential achieved, approx 2-3 weeks  Plan for next visit: Gait training, strengthening, fall prevention.

## 2021-11-01 ENCOUNTER — Other Ambulatory Visit: Payer: Medicare Other

## 2021-11-01 ENCOUNTER — Other Ambulatory Visit: Payer: Self-pay

## 2021-11-03 ENCOUNTER — Ambulatory Visit: Payer: Medicare Other

## 2021-11-03 ENCOUNTER — Other Ambulatory Visit: Payer: Self-pay

## 2021-11-03 VITALS — Ht 66.0 in | Wt 250.0 lb

## 2021-11-03 DIAGNOSIS — Z96642 Presence of left artificial hip joint: Secondary | ICD-10-CM

## 2021-11-03 NOTE — Progress Notes (Signed)
Total Hip Replacement, Follow-Up Visit    HPI: Carly Higgins returns to the office approximately 2 weeks after a left total hip replacement on 10/20/21 with Dr. Arlyce Dice, and reports that they are doing relatively well, and overall is pleased with their early outcome. She comes in today for an incisional check and staple removal.  Pain has been well controlled with her prescribed Oxycodone. She has been working with in-home PT and using a walker to ambulate. They deny any acute events such as fall. They deny any recent fevers, chills, or other constitutional signs of illness or infection. They deny any peri-incisional redness or drainage.     Pain medication: Oxycodone  Gait assistive device: Walker  Incision: Healing well, staples in place  Current physical therapy: In-home    PHYSICAL EXAMINATION:    Physical examination of the left hip reveals an incision that is clean, dry and intact with minimal redness or erythema. Staples present.  The patient walks with a reasonable gait and Mild antalgia.    The hip has painless range of motion with flexion up to 90 degrees, internal rotation from 5 to 10 degrees and external rotation to 20 degrees.    The lower extremity is neurovascularly intact per the patient's baseline sensation and strength.    Radiographs:   No new images obtained    IMPRESSION:  Carly Higgins is a 61 y.o. female following up approximately 2 weeks status post left total hip arthroplasty.     PLAN:  The patient is doing well overall.  Staples removed in office. After consulting with Dr. Arlyce Dice, patient was instructed that she could resume her Trulicity. Antibiotic prophylaxis guidelines and prohibition for 3 months postoperatively for dental and surgical procedures was re-iterated.  We reviewed activity and range-of-motion precautions, icing and elevating, as well as the use of anti-inflammatories if patient is safely able to take them.  Instructions were given regarding continuation of pain meds and  anticoagulation, as well cessation of those medications.      Next follow up: previously scheduled 4-week post-op appointment on 11/17/21    Studies to order for next visit: None, unless clinically indicated upon evaluation    All additional questions were answered, the patient was encouraged to call with any future questions or concerns.    Elsworth Soho, FNP-C    Division of Adult Radiation protection practitioner of [facility]  Department of Orthopaedics and Rehabilitation

## 2021-11-03 NOTE — Patient Instructions (Signed)
2-Week Post-Operative Instructions    It is common to have pain and swelling after your joint replacement surgery months after the surgery, especially each time you increase your activity. Each patient will recover at a different rate, so please do not be discouraged or concerned if you are not as far along in the recovery as you would like.  In order to get the most out of your joint replacement, you must follow the instructions below:    Ice & Elevation:  Icing is essential for pain/swelling reduction and should be continued as needed for months after surgery. Use 20-30 minutes at a time, with a 30-minute break in between the icing sessions.  Combine with elevation of knee above your heart for best results.  If you underwent hip replacement, true elevation of the joint in this manner is challenging, but elevation of the extremity will help with your recovery regardless.    Elevate your foot on pillows so your foot is above the level of your heart. If your foot is not above the level of your heart it does not count as elevating and will not be effective!           Medications: The following two medications (anti-inflammatory and acetaminophen) should be taken the next 4-6 weeks if allowed by your primary care physician.  Using this medication combination should allow you to continue to wean off of the postoperative narcotic pain medication.  Some studies have demonstrated that this medication combination can be as effective as a narcotic for postoperative pain control.        Assist Devices: Unless you have been instructed otherwise you may discontinue your current assist device, as you feel safe. If using a walker you may progress to a cane. If using a cane you can progress to walking without any assist device. If you have problems with balance you should continue with the assist device to be safe.    Return to Normal Activities & Exercise:  As you begin to increase your activities and exercising regularly,  stationary biking, elliptical machines or swimming are great exercises to minimize traumatic impact to your new joint replacement, while building stamina, endurance and cardiovascular health.  As you return to normal activities, keep in mind that your postoperative restrictions can help guide you how to safely perform certain tasks if you had your hip replaced.    Stationary Biking/Elliptical Machine:  Start 2-3 times a week, 15 minutes max, low resistance (easy to pedal)  Slowly build up to 4-5 times a week for 30 minutes; Increase  resistance slowly as tolerated.    Swimming (once your incision is completely healed and all scabs are gone):  Start 2-3 times a week for 15 min.  Build to 4-5 times a week for 30 min. as tolerated    Going to the Dentist: Do not resume dental work, including routine cleanings, until three months from your surgery date. The American Academy of Orthopedic Surgeons no longer recommends routine antibiotic use before invasive dental procedures.  In some select patients, there may be a benefit to taking preventative antibiotics if risk is high enough.  If you meet any of the below criteria, you may benefit from antibiotics before these procedures, but discuss with your orthopedic surgeon:   You are severely immunocompromised (on chemotherapy or long term oral steroids)  You have previously been treated with surgery for an infection of a replaced joint  You have poorly controlled diabetes (Hemoglobin A1c > 8)      Incisional care: Continue to monitor your incision daily and ensure full healing.  Don't soak your incision (soak in a bath, sit in a hot tub) until your incision is completely healed without scabs. This generally occurs around 6 weeks after your surgery.  Notify Dr. Kaplan's office if your incision isn't healing properly or if there is persistent drainage.    Return to driving: You may return to driving once you have weaned off your narcotic pain medication (you may NOT drive if you  are taking narcotic pain medication), and you feel strong enough to safely press the brake pedal in an emergency.  Good general practice is to practice driving in an empty parking lot to ensure you can safely switch between the gas and brake pedals with an appropriate reflex response time.    Traveling after joint replacement:  In general, many patients feel ready within a few weeks of surgery.  As long as you understand the risks, and take appropriate preventative measures, traveling is safe generally about 4 weeks after surgery.  The risk of forming a blood clot that can have devastating consequences is highest in this window.  You will be taking a blood thinner and using mechanical compression devices to minimize risk during this period.  Further measures you can take include frequent ambulation (every 1-2 hours), performing your ankle pump and calf squeeze exercises, frequent stretching, and stay adequately hydrated.  These measures can be taken while traveling by car or airplane, and are strongly recommended if urgent travel is planned during this period.  As a side note, your implant is likely to set off the metal detectors in airport security. If you notify the TSA agents prior to screening, they may elect screen using a body scanner to avoid any further extensive screening (i.e. pat down).

## 2021-11-06 ENCOUNTER — Telehealth: Payer: Self-pay | Admitting: Hematology and Oncology

## 2021-11-06 ENCOUNTER — Other Ambulatory Visit: Payer: Medicare Other

## 2021-11-06 ENCOUNTER — Other Ambulatory Visit: Payer: Self-pay

## 2021-11-06 NOTE — Progress Notes (Signed)
Assessment findings/ Changes from previous visit: Pt reports she is a little more sore today, states she has been trying to do more today because she had a tired day yesterday.  Occasionally will go without assistive device. Does not report any falls. She reports that after her second visit this week she is going to move back to her apartment. Overall pt feels she is steadily improving from where she was after surgery  Skilled Need/Focus of Care: Pt requires skilled intervention in ambulation, strengthening, and fall prevention s/p L THA.  Instructing pt to remain with use of cane or walker as she presents with antalgic gait pattern without assistive device, good teach back noted.   Caregiver availability and willingness: Pt has assist from her mother as needed  Discharge plan: Until goals met or max functional potential achieved, approx 3 weeks  Plan for next visit: Gait training with least restrictive device, strengthening, balance training.

## 2021-11-06 NOTE — Telephone Encounter (Signed)
Pt is calling to cancel port flush for 02/28 and then wondering if could move 12/06 to the 12/01 or end of November since she will be out of state that week on vacation.

## 2021-11-07 ENCOUNTER — Other Ambulatory Visit: Payer: Self-pay

## 2021-11-09 ENCOUNTER — Other Ambulatory Visit: Payer: Medicare Other

## 2021-11-09 NOTE — Progress Notes (Signed)
Assessment findings/ Changes from previous visit: Pt reports she is doing well, is all packed and ready to return home after appt.  States her pain is improving.  Denies any falls. Pt is intermittently walking without assistive device with slight increased pain, discussed use of assistive device to decrease pain with good teach back  Skilled Need/Focus of Care:  Pt requires skilled intervention in ambulation, strengthening, fall prevention s/p L THA  Caregiver availability and willingness: Pt has been staying with mother, is leaving to return home after appt.    Discharge plan: Until goals met or max functional potential achieved, approx 2 weeks  Plan for next visit: Gait training, strengthening, fall prevention

## 2021-11-13 ENCOUNTER — Other Ambulatory Visit: Payer: Medicare Other

## 2021-11-13 NOTE — Progress Notes (Signed)
Assessment findings/ Changes from previous visit: Patient reports she has been doing well since returning home, she has been able to ambulate in hallway of apartment building with cane. Patient denies falls. Reviewed HEP and instructed on appropriate frequency to continue to improve strength and independence with mobility. Patient progressing towards long term goals, continues to benefit from PT to reduce fall risk, improve pain, increase LE strength, and increase independence.  Skilled Need/Focus of Care: PT services for wound assessment, gait and transfer training, HEP education, LE strengthening, home safety.  Caregiver availability and willingness: Patient has supportive friend and mother who assist as needed with IADLs.   Discharge plan: Plan for discharge when goals met or max potential achieved, anticipate 2-4 visits.  Plan for next visit: progress hip strengthening exercises, gait training

## 2021-11-15 ENCOUNTER — Other Ambulatory Visit: Payer: Medicare Other

## 2021-11-15 NOTE — Progress Notes (Signed)
Assessment findings/ Changes from previous visit: Patient reports pain is improving and she has been able to do more walking with cane. Patient's gait quality continues to improve with use of cane. Patient reports compliance with HEP. Patient denies falls. Patient progressing toward long term goals, continues to benefit from PT to maximize strength, endurance and return to PLOF.   Skilled Need/Focus of Care: PT services for home safety, LE strengthening, gait training, HEP progression, wound assessment.   Caregiver availability and willingness: Patient has supportive mother and family who assist if needed.   Discharge plan: Plan for discharge when goals met or max potential achieved, anticipate 2 more visits.  Plan for next visit: gait training, progress hip stabilizer exercises

## 2021-11-16 ENCOUNTER — Ambulatory Visit: Payer: Medicare Other

## 2021-11-17 ENCOUNTER — Ambulatory Visit: Payer: Medicare Other

## 2021-11-17 ENCOUNTER — Ambulatory Visit
Admission: RE | Admit: 2021-11-17 | Discharge: 2021-11-17 | Disposition: A | Discharge status: Home / Self Care | Payer: Medicare Other | Source: Ambulatory Visit

## 2021-11-17 ENCOUNTER — Other Ambulatory Visit: Payer: Self-pay

## 2021-11-17 VITALS — BP 112/72

## 2021-11-17 DIAGNOSIS — Z96642 Presence of left artificial hip joint: Secondary | ICD-10-CM

## 2021-11-17 DIAGNOSIS — Z471 Aftercare following joint replacement surgery: Secondary | ICD-10-CM

## 2021-11-17 NOTE — Patient Instructions (Signed)
4-Week Post-Operative Instructions    It is common to have pain and swelling after your joint replacement surgery months after the surgery, especially each time you increase your activity. Each patient will recover at a different rate, so please do not be discouraged or concerned if you are not as far along in the recovery as you would like.  In order to get the most out of your joint replacement, you must follow the instructions below:    Ice & Elevation:  Icing is essential for pain/swelling reduction and should be continued as needed for months after surgery. Use 20-30 minutes at a time, with a 30-minute break in between the icing sessions.  Combine with elevation of knee above your heart for best results.  If you underwent hip replacement, true elevation of the joint in this manner is challenging, but elevation of the extremity will help with your recovery regardless.    Elevate your foot on pillows so your foot is above the level of your heart. If your foot is not above the level of your heart it does not count as elevating and will not be effective!           Medications: The following two medications (anti-inflammatory and acetaminophen) should be taken the next 4-6 weeks if allowed by your primary care physician.  Using this medication combination should allow you to continue to wean off of the postoperative narcotic pain medication.  Some studies have demonstrated that this medication combination can be as effective as a narcotic for postoperative pain control.        Assist Devices: Unless you have been instructed otherwise you may discontinue your current assist device, as you feel safe. If using a walker you may progress to a cane. If using a cane you can progress to walking without any assist device. If you have problems with balance you should continue with the assist device to be safe.    Return to Normal Activities & Exercise:  As you begin to increase your activities and exercising regularly,  stationary biking, elliptical machines or swimming are great exercises to minimize traumatic impact to your new joint replacement, while building stamina, endurance and cardiovascular health.  As you return to normal activities, keep in mind that your postoperative restrictions can help guide you how to safely perform certain tasks if you had your hip replaced.    Stationary Biking/Elliptical Machine:  Start 2-3 times a week, 15 minutes max, low resistance (easy to pedal)  Slowly build up to 4-5 times a week for 30 minutes; Increase  resistance slowly as tolerated.    Swimming (once your incision is completely healed and all scabs are gone):  Start 2-3 times a week for 15 min.  Build to 4-5 times a week for 30 min. as tolerated    Going to the Dentist: Do not resume dental work, including routine cleanings, until three months from your surgery date. The American Academy of Orthopedic Surgeons no longer recommends routine antibiotic use before invasive dental procedures.  In some select patients, there may be a benefit to taking preventative antibiotics if risk is high enough.  If you meet any of the below criteria, you may benefit from antibiotics before these procedures, but discuss with your orthopedic surgeon:   You are severely immunocompromised (on chemotherapy or long term oral steroids)  You have previously been treated with surgery for an infection of a replaced joint  You have poorly controlled diabetes (Hemoglobin A1c > 8)      Incisional care: Continue to monitor your incision daily and ensure full healing.  Don't soak your incision (soak in a bath, sit in a hot tub) until your incision is completely healed without scabs. This generally occurs around 6 weeks after your surgery.  Notify Dr. Kaplan's office if your incision isn't healing properly or if there is persistent drainage.    Return to driving: You may return to driving once you have weaned off your narcotic pain medication (you may NOT drive if you  are taking narcotic pain medication), and you feel strong enough to safely press the brake pedal in an emergency.  Good general practice is to practice driving in an empty parking lot to ensure you can safely switch between the gas and brake pedals with an appropriate reflex response time.    Traveling after joint replacement:  In general, many patients feel ready within a few weeks of surgery.  As long as you understand the risks, and take appropriate preventative measures, traveling is safe generally about 4 weeks after surgery.  The risk of forming a blood clot that can have devastating consequences is highest in this window.  You will be taking a blood thinner and using mechanical compression devices to minimize risk during this period.  Further measures you can take include frequent ambulation (every 1-2 hours), performing your ankle pump and calf squeeze exercises, frequent stretching, and stay adequately hydrated.  These measures can be taken while traveling by car or airplane, and are strongly recommended if urgent travel is planned during this period.  As a side note, your implant is likely to set off the metal detectors in airport security. If you notify the TSA agents prior to screening, they may elect screen using a body scanner to avoid any further extensive screening (i.e. pat down).

## 2021-11-17 NOTE — Progress Notes (Signed)
Total Hip Replacement, Follow-Up Visit    HPI: Carly Higgins returns to the office approximately 4 weeks after a left total hip replacement on 10/20/21 with Dr. Arlyce Dice, and reports that they are doing relatively well, and overall is pleased with their early outcome.  She was last seen on 11/03/21 where she had incisional staples removed.   Today, she reports that her pain continues to improve, and she is weaning off of her Oxycodone. She is currently ambulating with a cane and continues to work with visiting PT. They deny any acute events such as fall. They deny any recent fevers, chills, or other constitutional signs of illness or infection. They deny any peri-incisional redness or drainage.     Pain medication: Oxycodone and Tylenol  Gait assistive device: Cane  Incision: Healing well  Current physical therapy: In-home PT    PHYSICAL EXAMINATION:    Physical examination of the left hip reveals an incision that is clean, dry and intact with minimal redness or erythema.   The patient walks with a reasonable gait and Mild antalgia.    The hip has painless range of motion with flexion up to 90 degrees, internal rotation from 5 to 10 degrees and external rotation to 20 degrees.    The lower extremity is neurovascularly intact per the patient's baseline sensation and strength.    Radiographs:   X-rays obtained today, and reviewed personally, are an AP pelvis, AP and lateral of the left hip and demonstrate well-fixed acetabular and femoral components in good alignment.     IMPRESSION:  Carly Higgins is a 61 y.o. female following up approximately 4 weeks status post left total hip arthroplasty.     PLAN:  The patient is doing well overall.  The importance of fall avoidance was reinforced. Antibiotic prophylaxis guidelines and prohibition for 3 months postoperatively for dental and surgical procedures was re-iterated.  We reviewed activity and range-of-motion precautions, icing and elevating, as well as the use of  anti-inflammatories if patient is safely able to take them.  Instructions were given regarding continuation of pain meds and anticoagulation, as well cessation of those medications.   Of note, the patient states she is leaving for [place] in 2 weeks and will be down there until the beginning of May 2024. She would like to schedule her follow-up appointment for when she returns.      Next follow up: May 2024 with Dr. Arlyce Dice  Studies to order for next visit: None, unless clinically indicated upon evaluation    All additional questions were answered, the patient was encouraged to call with any future questions or concerns.    Elsworth Soho, FNP-C    Division of Adult Radiation protection practitioner of [facility]  Department of Orthopaedics and Rehabilitation

## 2021-11-20 ENCOUNTER — Other Ambulatory Visit: Payer: Medicare Other

## 2021-11-20 ENCOUNTER — Telehealth: Payer: Self-pay

## 2021-11-20 NOTE — Telephone Encounter (Signed)
[facility] - Comprehensive Medication Management Note    Carly Higgins is a 61 y.o. female who presents for follow-up visit with the clinical pharmacist. Method of visit: Telephone. Patient was referred by PCP for comprehensive medication management in the setting of Pre-DM.       Carly Higgins says that injection hurts when she does it. She is unsure if she would like to continue medication since it hurts more than when she was on Mounjaro.          There were no vitals filed for this visit.             Assessment and Plan:  With regards to medication management for this patient, the following aspects were reviewed: Indication, effectiveness, safety and convenience of each medication. The patient's medical conditions and medications were assessed, evaluated, and deemed meeting goals of drug therapy except as listed below:     Pre-DM   - Dulaglutide 0.75 mg weekly     I reviewed with her to take medication out of the refrigerator and let it come to room temperature before injecting  -  this may help with injection pain. She can also try icing the area to numb it a little. She believes the needle is longer than the other GLP-1's that she has tried. She stopped taking medication for 2 weeks when she got her hip surgery. She has re-started and is due for injection today. She will call the office tomorrow with an update if she is still experiencing injection site pain.    4. Other Medication-Related Interventions: In addition to the medication changes noted above, these medication-related interventions were completed by the Pharmacist during today's visit:  modified medication regimen to address medication related side effect    This report was sent to Dr. Maggie Font for review and final approval.     Patient was able to verbalize understanding and repeat back key educational points.     Follow-up: Patient will call the office on Tuesday 11/21/21.     Patient Status: Open to [facility] Services     Duration of  Visit: 1-15 min    Thank you for allowing me to participate in the care of this patient. Please contact me with any questions.     Loura Halt, PharmD  PGY1 Community Pharmacy Resident  [facility]   [address]   [place], [place] [address]

## 2021-11-20 NOTE — Progress Notes (Signed)
Assessment findings/ Changes from previous visit: Patient reports she has been doing well, hip pain continues to improve. Discussed obtaining new cane that can be adjusted for patient's height. Patient denies falls. Patient agreeable to stair navigation training next visit to maximize safety in apartment building in case of emergency and allow for increased independence in community. Patient progressing toward goals, demonstrating ability to initiate gait training without AD on even surfaces.  Skilled Need/Focus of Care: PT services for home safety, HEP progression, LE strengthening, gait training, AD progression, pain management education.  Caregiver availability and willingness: Patient has supportive family who assist with IADLs as needed.  Discharge plan: Plan for discharge when goals met or max potential achieved, anticipate next visit.  Plan for next visit: discharge, stair training

## 2021-11-20 NOTE — Telephone Encounter (Signed)
Seen and agree  Kevin Mario G CAHN-HIDALGO, MD

## 2021-11-21 ENCOUNTER — Ambulatory Visit: Payer: Medicare Other

## 2021-11-22 ENCOUNTER — Other Ambulatory Visit: Payer: Medicare Other

## 2021-11-22 ENCOUNTER — Telehealth: Payer: Self-pay | Admitting: Hematology

## 2021-11-22 ENCOUNTER — Other Ambulatory Visit: Payer: Self-pay

## 2021-11-22 NOTE — Progress Notes (Signed)
Assessment findings/ Changes from previous visit: Patient reports compliance with HEP and has been using cane for mobility. Patient reports feeling tired at times, has been doing more in her home. Patient denies falls. Patient demonstrates improved motor control with strengthening exercises and demonstrates ability to progress to stair training with reciprocal pattern.   Skilled Need/Focus of Care: PT services for wound assessment, pain management education, gait training, stair navigation training, HEP education, LE strengthening.  Caregiver availability and willingness: Patient's family assists with IADLs if needed.  Discharge plan: Discharge today. Patient has met mobility goals and no longer requires home care services.   Plan for next visit: n/a

## 2021-11-22 NOTE — Telephone Encounter (Signed)
Writer called patient to to reschedule her follow up. Patient wants to call back to reschedule.

## 2021-11-23 ENCOUNTER — Ambulatory Visit: Payer: No Typology Code available for payment source | Admitting: Hematology

## 2021-11-23 ENCOUNTER — Ambulatory Visit: Payer: No Typology Code available for payment source

## 2021-11-24 ENCOUNTER — Ambulatory Visit: Payer: Medicare Other

## 2021-11-27 ENCOUNTER — Other Ambulatory Visit: Payer: Self-pay

## 2021-11-27 DIAGNOSIS — E119 Type 2 diabetes mellitus without complications: Secondary | ICD-10-CM

## 2021-11-27 MED ORDER — TIRZEPATIDE 2.5 MG/0.5ML SC SOAJ *I*
2.5000 mg | SUBCUTANEOUS | 0 refills | Status: DC
Start: 2021-11-27 — End: 2022-06-08

## 2021-11-27 NOTE — Telephone Encounter (Signed)
[facility] - Comprehensive Medication Management Note    Carly Higgins is a 61 y.o. female who presents for follow-up visit with the clinical pharmacist. Method of visit: Telephone. Patient was referred by PCP for comprehensive medication management in the setting of pre-DM.     Subjective   Carly Higgins is having stomach cramps on Trulicity and she reports that the injection hurts and is uncomfortable. She tried letting the injection come to room temperature before injecting which seemed to help a little. She would like to switch back to [facility]. She was previously on [facility] and was doing great on that but coupon stopped working and became unaffordable. She has now failed both Ozempic and Trulicity due to stomach upset. She leaves for [place] on 11/28.                 Assessment and Plan:  With regards to medication management for this patient, the following aspects were reviewed: Indication, effectiveness, safety and convenience of each medication. The patient's medical conditions and medications were assessed, evaluated, and deemed meeting goals of drug therapy except as listed below:     Pre-DM   - Dulaglutide 0.75 mg weekly     Carly Higgins would like to switch back to [facility] since she was doing well on this prior to switching to [employer] and Trulicity. This will require a prior authorization through insurance. She has also failed Metformin due to stomach pains/cramps.   Discontinue dulaglutide   Consider starting tirzepatide 2.5 mg subcutaneously weekly x4 weeks, then increase to 5 mg weekly thereafter    4. Other Medication-Related Interventions: In addition to the medication changes noted above, these medication-related interventions were completed by the Pharmacist during today's visit:  modified medication regimen to address medication related side effect    This report and pended medication orders were sent to Dr. Maggie Font for review and final approval.     Patient was able to verbalize  understanding and repeat back key educational points.     Follow-up: Pharmacist will follow up with patient in 1 week.      Patient Status: Open to [facility]     Duration of Visit: 1-15 min    Thank you for allowing me to participate in the care of this patient. Please contact me with any questions.     Carly Higgins, PharmD  PGY1 Community Pharmacy Resident  [facility]   [address], [address]   [place], [place] [address]

## 2021-11-28 ENCOUNTER — Ambulatory Visit: Payer: Medicare Other

## 2021-11-28 ENCOUNTER — Encounter: Payer: Self-pay | Admitting: Internal Medicine

## 2021-11-28 NOTE — Progress Notes (Signed)
Seen and agree  Wake Conlee G CAHN-HIDALGO, MD

## 2021-11-28 NOTE — Progress Notes (Signed)
PA for Mounjaro 2.5 mg sent through Covermymeds.  Awaiting determination

## 2021-11-29 ENCOUNTER — Other Ambulatory Visit: Payer: Self-pay | Admitting: Oncology

## 2021-11-29 DIAGNOSIS — Z9481 Bone marrow transplant status: Secondary | ICD-10-CM

## 2021-11-29 DIAGNOSIS — R110 Nausea: Secondary | ICD-10-CM

## 2021-12-01 ENCOUNTER — Ambulatory Visit: Payer: Medicare Other

## 2021-12-04 ENCOUNTER — Ambulatory Visit: Payer: Medicare Other

## 2021-12-04 ENCOUNTER — Other Ambulatory Visit: Payer: Self-pay

## 2021-12-04 DIAGNOSIS — Z452 Encounter for adjustment and management of vascular access device: Secondary | ICD-10-CM | POA: Insufficient documentation

## 2021-12-04 DIAGNOSIS — F17210 Nicotine dependence, cigarettes, uncomplicated: Secondary | ICD-10-CM | POA: Insufficient documentation

## 2021-12-04 DIAGNOSIS — Z9481 Bone marrow transplant status: Secondary | ICD-10-CM

## 2021-12-04 DIAGNOSIS — C50412 Malignant neoplasm of upper-outer quadrant of left female breast: Secondary | ICD-10-CM | POA: Insufficient documentation

## 2021-12-04 DIAGNOSIS — Z171 Estrogen receptor negative status [ER-]: Secondary | ICD-10-CM | POA: Insufficient documentation

## 2021-12-04 DIAGNOSIS — D849 Immunodeficiency, unspecified: Secondary | ICD-10-CM

## 2021-12-04 MED ORDER — HEPARIN LOCK FLUSH 10 UNIT/ML IJ SOLN WRAPPED *I*
50.0000 [IU] | INTRAVENOUS | Status: DC | PRN
Start: 2021-12-04 — End: 2021-12-04
  Administered 2021-12-04: 50 [IU]

## 2021-12-04 NOTE — Progress Notes (Signed)
Pt arrived to infusion room today for port flush. IVAD accessed with 19g 3/4" needle, flushed with normal saline, positive blood return noted. Site benign. Heparin instilled. Port deaccessed. Patient discharged home in stable condition.

## 2021-12-05 ENCOUNTER — Ambulatory Visit: Payer: Medicare Other

## 2021-12-08 ENCOUNTER — Ambulatory Visit: Payer: Medicare Other

## 2021-12-12 ENCOUNTER — Ambulatory Visit: Payer: Medicare Other

## 2021-12-13 ENCOUNTER — Ambulatory Visit: Payer: Medicare Other

## 2021-12-13 ENCOUNTER — Encounter: Payer: Self-pay | Admitting: Internal Medicine

## 2021-12-13 NOTE — Progress Notes (Signed)
PA for [facility] was sent through Covermymeds to [employer].  Awaiting determination.

## 2021-12-15 ENCOUNTER — Ambulatory Visit: Payer: Medicare Other

## 2021-12-19 ENCOUNTER — Ambulatory Visit: Payer: Medicare Other

## 2021-12-21 ENCOUNTER — Ambulatory Visit: Payer: Medicare Other

## 2021-12-25 ENCOUNTER — Encounter: Payer: Self-pay | Admitting: Internal Medicine

## 2021-12-25 ENCOUNTER — Telehealth: Payer: Self-pay | Admitting: Internal Medicine

## 2021-12-25 DIAGNOSIS — M79671 Pain in right foot: Secondary | ICD-10-CM

## 2021-12-25 DIAGNOSIS — G620 Drug-induced polyneuropathy: Secondary | ICD-10-CM

## 2021-12-25 MED ORDER — GABAPENTIN 300 MG PO CAPSULE *I*
ORAL_CAPSULE | ORAL | 1 refills | Status: DC
Start: 2021-12-25 — End: 2022-06-08

## 2021-12-25 MED ORDER — DULOXETINE HCL 60 MG PO CPEP *I*
60.0000 mg | DELAYED_RELEASE_CAPSULE | Freq: Every day | ORAL | 5 refills | Status: DC
Start: 2021-12-25 — End: 2022-04-30

## 2021-12-25 NOTE — Patient Instructions (Signed)
Double your duloxetine to 60 mg once a day  You can use a 30 mg tablet you have 2 of them until gone I sent a new prescription for 60 mg tablets to the [facility] in [place].    For the gabapentin start with 300 mg 3 times a day morning noon and night  You can increase that to 1 in the morning 1 in the afternoon and 2 at night if it does not help within a couple days  And you can wrap it up slowly to taking 600 mg which is 2 capsules 3 times a day as needed    Feel this is not working make sure you reach out to [place] and let us know about it    Heat or ice to your feet if it seems to help please feel free to do so  I do agree with wearing shoes and socks at all times    Any questions or concerns anything that changes make sure you reach out to [place]    I wish you a very [place] Christmas and happy new year    [place]

## 2021-12-25 NOTE — Progress Notes (Signed)
[facility]  [address]  [place], [place] [address]                  SUBJECTIVE:       Reason For Visit:   Chief Complaint   Patient presents with    Foot Pain     Neuropathic pain since the chemotherapy - has had for 15 months -     Started after chemo - last year   The pain can be unbearable   She did try gabapentin but never gave it fair shot    Has tried [place] - Voltaren gel  Icy hot   None helped     Tylenol and Ibuprofen with no help   No other pills helped       Review of Systems   Musculoskeletal:  Positive for joint pain.   Neurological:         Neuropathic pain from her chemo   No new issues    Psychiatric/Behavioral:  Negative for depression. The patient is not nervous/anxious.            OBJECTIVE      There were no vitals filed for this visit.  There is no height or weight on file to calculate BMI.        Physical Exam  Pulmonary:      Comments: Normal breathing by phone   Neurological:      General: No focal deficit present.      Mental Status: She is alert and oriented to person, place, and time. Mental status is at baseline.   Psychiatric:         Mood and Affect: Mood normal.         Behavior: Behavior normal.         Thought Content: Thought content normal.             A&P:       Foot pain, bilateral  Neuropathy due to chemotherapeutic drug  Increase duloxetine to 60 mg   Gabapentin increase slowly to 300 mg three times a day and can go to 600 mg three times a day   Call  with update   Heat or ice as per her     Total time including documentation - 14 min          Shant Hence G CAHN-HIDALGO, MD12/18/20239:17 AM

## 2021-12-27 ENCOUNTER — Encounter: Payer: Self-pay | Admitting: Internal Medicine

## 2021-12-27 NOTE — Progress Notes (Signed)
PA for Mounjaro 2.5 mg sent through Covermymeds to [employer].  Awaiting determination

## 2022-01-02 ENCOUNTER — Telehealth: Payer: Self-pay | Admitting: Hematology and Oncology

## 2022-01-02 NOTE — Telephone Encounter (Signed)
Calling to see if the paperwork was filled out for benefits. Was sent in the beginning of December

## 2022-01-03 NOTE — Telephone Encounter (Signed)
No answer.  Message left informing caller that insurance disability document was faxed last week with confirmation received.  Call back encouraged if any further questions or concerns.

## 2022-01-30 ENCOUNTER — Telehealth: Payer: Self-pay | Admitting: Internal Medicine

## 2022-01-30 NOTE — Telephone Encounter (Signed)
She has been taking gabapentin 300 mg 6 pills tid.  States that it is not helping with pain in knees, numb toes and tingling.  She doesn't know where to go from here.     Is also going to restart her ozempic.

## 2022-01-30 NOTE — Telephone Encounter (Signed)
Yes, she is open to seeing a neurologist in [place].  She said she will contact her insurance company and see who is in her area and let us know the contact info.

## 2022-02-05 ENCOUNTER — Telehealth: Payer: Self-pay | Admitting: Internal Medicine

## 2022-02-05 DIAGNOSIS — R110 Nausea: Secondary | ICD-10-CM

## 2022-02-05 DIAGNOSIS — Z9481 Bone marrow transplant status: Secondary | ICD-10-CM

## 2022-02-05 MED ORDER — ONDANSETRON HCL 4 MG PO TABS *I*
4.0000 mg | ORAL_TABLET | Freq: Three times a day (TID) | ORAL | 0 refills | Status: DC | PRN
Start: 2022-02-05 — End: 2022-03-27

## 2022-02-05 NOTE — Telephone Encounter (Signed)
Patient is in [place] and requesting refill of ondansetron be sent to the [facility].

## 2022-02-22 ENCOUNTER — Encounter: Payer: Self-pay | Admitting: Gastroenterology

## 2022-02-22 ENCOUNTER — Telehealth: Payer: Self-pay | Admitting: Internal Medicine

## 2022-02-22 ENCOUNTER — Encounter: Payer: Self-pay | Admitting: Internal Medicine

## 2022-02-22 DIAGNOSIS — R61 Generalized hyperhidrosis: Secondary | ICD-10-CM

## 2022-02-22 NOTE — Progress Notes (Signed)
[facility]  [address]  [place], [place] [address]                  SUBJECTIVE:       Reason For Visit:   Chief Complaint   Patient presents with    Excessive Sweating     Sweats all the time   Head feeling very wet          Off and on - but only during the day   Does not check temperature     No new supplements   No new herbs     Sometimes needs to change clothing     Never at night     NO caffeine changes   No energy drinks     Episodes last few minutes up to 20 min   May have 2 per day   But mostly one a day         Review of Systems   Constitutional:  Positive for chills. Negative for fever and malaise/fatigue.   HENT: Negative.     Respiratory:  Negative for sputum production.    Cardiovascular:  Negative for chest pain.   Gastrointestinal:  Negative for abdominal pain, heartburn, nausea and vomiting.   Genitourinary:  Negative for dysuria, frequency, hematuria and urgency.   Musculoskeletal:  Negative for joint pain, myalgias and neck pain.   Skin:  Negative for itching and rash.         All history family/social/allergies/HM/PMHx/PSHx was reviewed   OBJECTIVE      There were no vitals filed for this visit.  There is no height or weight on file to calculate BMI.      NO EXAM       A&P:       Excessive sweating  Will get [place] the labs in [place] and we can do some blood work  There is no intervention at this point  Will figure out if there is any other exposure she had  Increase hydration  Tylenol as needed     Total time including documentation 13 minutes    Deshea Pooley G CAHN-HIDALGO, MD2/15/202411:46 AM

## 2022-02-22 NOTE — Patient Instructions (Signed)
Increase your hydration significantly.    Get [place] the name of the lab and a fax number we can send orders to get blood work drawn in [place].    Make an assessment of any new chemicals and/or foods or substances that you may be ingesting that could be related to this.    Tylenol can be used as needed for discomfort or for some of these events.    Check your temperature  You have 1 of these episodes to see if there is a fever associated with it.    Take a look at all your pill bottles make sure that all the pill bottles are not correct dosages of your medications that you take on a regular basis.    Try to remember if when the started you got a new prescription/refill of the current medication that looks different than the ones prior.    Stay in touch with [place] in case the symptoms change or evolve.    Our next contact point should be when you have the blood work drawn I have the results in my possession.    Hope you feel better soon  [place]

## 2022-02-22 NOTE — Telephone Encounter (Signed)
She is currently in [place].  She has feels that she may have a vitamin deficiency based on a google search.     She has been having excessive sweating for the past 5-6 months.  This is to the point where her hair and clothes get soaked as if she stepped into shower.  She realizes that she probably will need an OV or TP but asks if something like this could be deficiency.  She has just recently started One a Day womens daily vitamins.

## 2022-03-02 ENCOUNTER — Encounter: Payer: Self-pay | Admitting: Gastroenterology

## 2022-03-06 ENCOUNTER — Encounter: Payer: Self-pay | Admitting: Gastroenterology

## 2022-03-07 ENCOUNTER — Ambulatory Visit: Payer: Medicare Other

## 2022-03-22 ENCOUNTER — Telehealth: Payer: Self-pay | Admitting: General Surgery

## 2022-03-22 NOTE — Telephone Encounter (Signed)
Spoke with Carly Higgins rescheduled her appointment to 6/3 at 9:30 am.

## 2022-03-22 NOTE — Telephone Encounter (Signed)
Pt is calling to cancel/reschedule apt on 04/22 at 2:30 PM. Would like pushed to June as she is in [place] dealing with family matters. Would like to see if there is anything for June 3rd as she will be coming in anyway's to another provider NP Elon Jester at 10 AM.

## 2022-03-24 ENCOUNTER — Other Ambulatory Visit: Payer: Self-pay | Admitting: Internal Medicine

## 2022-03-24 DIAGNOSIS — I4891 Unspecified atrial fibrillation: Secondary | ICD-10-CM

## 2022-03-27 ENCOUNTER — Other Ambulatory Visit: Payer: Self-pay | Admitting: Internal Medicine

## 2022-03-27 ENCOUNTER — Telehealth: Payer: Self-pay | Admitting: Hematology and Oncology

## 2022-03-27 DIAGNOSIS — Z9481 Bone marrow transplant status: Secondary | ICD-10-CM

## 2022-03-27 DIAGNOSIS — R110 Nausea: Secondary | ICD-10-CM

## 2022-03-27 DIAGNOSIS — Z789 Other specified health status: Secondary | ICD-10-CM

## 2022-03-27 NOTE — Telephone Encounter (Addendum)
Pt is calling to schedule a mammogram. Stated any day except 06/03 and 06/06 won't work. Would like morning for scan. Also, needs port removal as well, but would like done at [facility].

## 2022-03-28 DIAGNOSIS — Z171 Estrogen receptor negative status [ER-]: Secondary | ICD-10-CM

## 2022-03-29 ENCOUNTER — Other Ambulatory Visit: Payer: Self-pay | Admitting: Internal Medicine

## 2022-03-29 DIAGNOSIS — Z1231 Encounter for screening mammogram for malignant neoplasm of breast: Secondary | ICD-10-CM

## 2022-04-30 ENCOUNTER — Other Ambulatory Visit: Payer: Self-pay | Admitting: Internal Medicine

## 2022-04-30 ENCOUNTER — Ambulatory Visit: Payer: Medicare Other | Admitting: General Surgery

## 2022-04-30 DIAGNOSIS — Z9481 Bone marrow transplant status: Secondary | ICD-10-CM

## 2022-04-30 DIAGNOSIS — R110 Nausea: Secondary | ICD-10-CM

## 2022-05-20 ENCOUNTER — Encounter: Payer: Self-pay | Admitting: Internal Medicine

## 2022-05-22 ENCOUNTER — Ambulatory Visit: Payer: Medicare Other | Admitting: Orthopedic Surgery

## 2022-06-01 ENCOUNTER — Encounter: Payer: Self-pay | Admitting: Internal Medicine

## 2022-06-05 ENCOUNTER — Other Ambulatory Visit
Admission: RE | Admit: 2022-06-05 | Discharge: 2022-06-05 | Disposition: A | Discharge status: Home / Self Care | Payer: Medicare Other | Source: Ambulatory Visit | Attending: Internal Medicine | Admitting: Internal Medicine

## 2022-06-05 DIAGNOSIS — R202 Paresthesia of skin: Secondary | ICD-10-CM | POA: Insufficient documentation

## 2022-06-05 DIAGNOSIS — R200 Anesthesia of skin: Secondary | ICD-10-CM | POA: Insufficient documentation

## 2022-06-05 DIAGNOSIS — C50412 Malignant neoplasm of upper-outer quadrant of left female breast: Secondary | ICD-10-CM | POA: Insufficient documentation

## 2022-06-05 DIAGNOSIS — E119 Type 2 diabetes mellitus without complications: Secondary | ICD-10-CM | POA: Insufficient documentation

## 2022-06-05 DIAGNOSIS — R61 Generalized hyperhidrosis: Secondary | ICD-10-CM | POA: Insufficient documentation

## 2022-06-05 DIAGNOSIS — Z171 Estrogen receptor negative status [ER-]: Secondary | ICD-10-CM | POA: Insufficient documentation

## 2022-06-05 LAB — CBC AND DIFFERENTIAL
Baso # K/uL: 0 10*3/uL (ref 0.0–0.2)
Eos # K/uL: 0.2 10*3/uL (ref 0.0–0.5)
Hematocrit: 40 % (ref 34–49)
Hemoglobin: 12.5 g/dL (ref 11.2–16.0)
IMM Granulocytes #: 0 10*3/uL (ref 0.0–0.0)
IMM Granulocytes: 0.5 %
Lymph # K/uL: 1.8 10*3/uL (ref 1.0–5.0)
MCV: 94 fL (ref 75–100)
Mono # K/uL: 0.5 10*3/uL (ref 0.1–1.0)
Neut # K/uL: 3.6 10*3/uL (ref 1.5–6.5)
Nucl RBC # K/uL: 0 10*3/uL (ref 0.0–0.0)
Nucl RBC %: 0 /100 WBC (ref 0.0–0.2)
Platelets: 266 10*3/uL (ref 150–450)
RBC: 4.2 MIL/uL (ref 4.0–5.5)
RDW: 16.2 % — ABNORMAL HIGH (ref 0.0–15.0)
Seg Neut %: 58.9 %
WBC: 6.1 10*3/uL (ref 3.5–11.0)

## 2022-06-05 LAB — COMPREHENSIVE METABOLIC PANEL
ALT: 30 U/L (ref 0–35)
AST: 31 U/L (ref 0–35)
Albumin: 4.6 g/dL (ref 3.5–5.2)
Alk Phos: 141 U/L — ABNORMAL HIGH (ref 35–105)
Anion Gap: 14 (ref 7–16)
Bilirubin,Total: 0.5 mg/dL (ref 0.0–1.2)
CO2: 26 mmol/L (ref 20–28)
Calcium: 10.3 mg/dL — ABNORMAL HIGH (ref 8.6–10.2)
Chloride: 98 mmol/L (ref 96–108)
Creatinine: 0.81 mg/dL (ref 0.51–0.95)
Glucose: 175 mg/dL — ABNORMAL HIGH (ref 60–99)
Lab: 19 mg/dL (ref 6–20)
Potassium: 5.2 mmol/L — ABNORMAL HIGH (ref 3.3–5.1)
Sodium: 138 mmol/L (ref 133–145)
Total Protein: 7.1 g/dL (ref 6.3–7.7)
eGFR BY CREAT: 82 *

## 2022-06-05 LAB — NEUTROPHIL #-INSTRUMENT: Neutrophil #-Instrument: 3.6 10*3/uL

## 2022-06-05 LAB — MICROALBUMIN, URINE, RANDOM
Creatinine,UR: 90 mg/dL (ref 20–300)
Microalb/Creat Ratio: 14.3 mg MA/g CR (ref 0.0–29.9)
Microalbumin,UR: 1.29 mg/dL

## 2022-06-05 LAB — SEDIMENTATION RATE, AUTOMATED: Sedimentation Rate: 26 mm/hr (ref 0–30)

## 2022-06-05 LAB — MULTIPLE ORDERING DOCS

## 2022-06-05 LAB — CRP: CRP: 12 mg/L — ABNORMAL HIGH (ref 0–8)

## 2022-06-05 LAB — TSH: TSH: 4.02 u[IU]/mL (ref 0.27–4.20)

## 2022-06-05 LAB — HEMOGLOBIN A1C: Hemoglobin A1C: 7.5 % — ABNORMAL HIGH

## 2022-06-07 ENCOUNTER — Other Ambulatory Visit: Payer: Self-pay

## 2022-06-08 ENCOUNTER — Encounter: Payer: Self-pay | Admitting: Internal Medicine

## 2022-06-08 ENCOUNTER — Other Ambulatory Visit: Payer: Self-pay

## 2022-06-08 ENCOUNTER — Ambulatory Visit: Payer: Self-pay | Admitting: Internal Medicine

## 2022-06-08 ENCOUNTER — Other Ambulatory Visit: Payer: Self-pay | Admitting: Internal Medicine

## 2022-06-08 VITALS — BP 104/66 | HR 88 | Ht 66.0 in | Wt 269.0 lb

## 2022-06-08 DIAGNOSIS — Z9481 Bone marrow transplant status: Secondary | ICD-10-CM

## 2022-06-08 DIAGNOSIS — M25552 Pain in left hip: Secondary | ICD-10-CM

## 2022-06-08 DIAGNOSIS — R110 Nausea: Secondary | ICD-10-CM

## 2022-06-08 DIAGNOSIS — E039 Hypothyroidism, unspecified: Secondary | ICD-10-CM

## 2022-06-08 DIAGNOSIS — E1140 Type 2 diabetes mellitus with diabetic neuropathy, unspecified: Secondary | ICD-10-CM

## 2022-06-08 DIAGNOSIS — F4323 Adjustment disorder with mixed anxiety and depressed mood: Secondary | ICD-10-CM | POA: Insufficient documentation

## 2022-06-08 DIAGNOSIS — R61 Generalized hyperhidrosis: Secondary | ICD-10-CM

## 2022-06-08 DIAGNOSIS — E119 Type 2 diabetes mellitus without complications: Secondary | ICD-10-CM

## 2022-06-08 DIAGNOSIS — T451X5A Adverse effect of antineoplastic and immunosuppressive drugs, initial encounter: Secondary | ICD-10-CM

## 2022-06-08 LAB — LACTATE DEHYDROGENASE: LD: 202 U/L (ref 118–225)

## 2022-06-08 MED ORDER — DRYSOL 20 % EX SOLN
Freq: Every evening | CUTANEOUS | 1 refills | Status: DC
Start: 2022-06-08 — End: 2022-08-10

## 2022-06-08 MED ORDER — DULOXETINE HCL 30 MG PO CPEP *I*
DELAYED_RELEASE_CAPSULE | ORAL | 0 refills | Status: DC
Start: 2022-06-08 — End: 2022-08-20

## 2022-06-08 MED ORDER — TIRZEPATIDE 2.5 MG/0.5ML SC SOAJ *I*
2.5000 mg | SUBCUTANEOUS | 4 refills | Status: DC
Start: 2022-06-08 — End: 2022-08-10

## 2022-06-08 NOTE — Progress Notes (Signed)
[facility]  [address]  [place], [place] [address]                  SUBJECTIVE:       Reason For Visit:   Chief Complaint   Patient presents with    increased sweating     After chemo and radiation last year, she noticed that she is sweating more.  Has to change 3x a day and wear head bands./kk     Sweating   Did not follow-up with Hematologist   She has not follow-up   She needs to change clothes      Off and on neuropathy changes   No new meds/supplements    No drug use     Wants to wean off the Cymbalta   Wants to go back to Lexapro       Review of Systems   Constitutional:  Positive for diaphoresis. Negative for chills, fever, malaise/fatigue and weight loss.   Respiratory: Negative.     Cardiovascular: Negative.    Musculoskeletal:  Positive for back pain and joint pain. Negative for neck pain.        B hip pain    Neurological:  Negative for weakness.        Neuropathy pain      Psychiatric/Behavioral:  Positive for depression. The patient is nervous/anxious. The patient does not have insomnia.          All history family/social/allergies/HM/PMHx/PSHx was reviewed   OBJECTIVE      Vitals:    06/08/22 0916   BP: 104/66   Pulse: 88   Weight: 122 kg (269 lb)   Height: 1.676 m (5\' 6" )     Body mass index is 43.42 kg/m.        Physical Exam  Constitutional:       General: She is not in acute distress.     Appearance: Normal appearance. She is obese. She is not ill-appearing or toxic-appearing.   HENT:      Head: Normocephalic.      Right Ear: Tympanic membrane, ear canal and external ear normal.      Left Ear: Tympanic membrane, ear canal and external ear normal.   Eyes:      Extraocular Movements: Extraocular movements intact.      Conjunctiva/sclera: Conjunctivae normal.      Pupils: Pupils are equal, round, and reactive to light.   Cardiovascular:      Rate and Rhythm: Normal rate and regular rhythm.      Pulses:           Dorsalis pedis pulses are 1+ on the right side and  1+ on the left side.        Posterior tibial pulses are 1+ on the right side and 1+ on the left side.      Heart sounds: Normal heart sounds.   Pulmonary:      Effort: Pulmonary effort is normal.      Breath sounds: Normal breath sounds.   Abdominal:      General: Abdomen is flat. Bowel sounds are normal.      Palpations: Abdomen is soft.   Musculoskeletal:         General: No swelling.      Right lower leg: No edema.      Left lower leg: No edema.   Feet:      Right foot:      Skin integrity: Skin  integrity normal.      Left foot:      Skin integrity: Skin integrity normal.      Comments: Diabetic foot exam was performed:  Yes. Monofilament sensation was Normal on the L foot and Normal on the R foot. Vibratory sensation was Normal on the L foot and Normal on the R foot.         Lymphadenopathy:      Head:      Right side of head: No submental, submandibular, preauricular or posterior auricular adenopathy.      Left side of head: No submental, submandibular, preauricular or posterior auricular adenopathy.      Cervical: No cervical adenopathy.      Right cervical: No deep or posterior cervical adenopathy.     Left cervical: No superficial, deep or posterior cervical adenopathy.      Upper Body:      Right upper body: No supraclavicular, axillary, pectoral or epitrochlear adenopathy.      Left upper body: No supraclavicular, axillary, pectoral or epitrochlear adenopathy.      Lower Body: No right inguinal adenopathy. No left inguinal adenopathy.   Neurological:      General: No focal deficit present.      Mental Status: She is alert.   Psychiatric:         Mood and Affect: Mood normal.         Behavior: Behavior normal.             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 Relate to sweating     Bilateral hip  pain.  Orhtopedic follow-up     Hypothyroidism, unspecified type  Levothyroxine   TSH checked   Routine follow-up     Neuropathy due to chemotherapeutic drug/ Depression/ANxiety   Wean off the cymbalta   May trial LExapro afterwards for the depression/anxiety   May need other options            Ourania Hamler G CAHN-HIDALGO, MD5/31/20249:38 AM

## 2022-06-08 NOTE — Patient Instructions (Signed)
Drysol wash off first thing in am - report back     Wean off the Cymbalta   Alternate 60 mg and 30mg  for 2-3 weeks then go to     30mg  a day for 2-3 weeks if all good then go to     30mg  alternate with nothing for 2-3 weeks then stop - when you atrat this dose we can start the lexapro if needed     IF at any point you have issues - with depression /anxiety let us know     See you in 3 months     Areej Tayler

## 2022-06-10 NOTE — Patient Instructions (Addendum)
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 Should be done every 2-3 months.      Consider removing Mediport since no further treatment planned at this time.     Follow up here in 5-6 months    Peripheral neuropathy    Did not receive benefit from acupuncture X 3    She stopped Gabepentin (as prescribed by PCP); did not find effective    I will notify her when new chemotherapy induced peripheral neuropathy study opens     I will check with integrative oncology program regarding group sessions for peripheral neuropathy.

## 2022-06-10 NOTE — Progress Notes (Signed)
Medical Oncology   [facility]     FOLLOW-UP VISIT    PATIENT NAME:  Carly Higgins, Carly Higgins    DOB:  06/10/1960   MRN:  [id number]  06/11/2022     DIAGNOSIS:  Clinical stage IIB left breast cancer    REASON FOR VISIT: Carly Higgins is seen today for follow up and ongoing recommendations for management of stage IIB (cT2, cN0, cM0) grade 3, ER negative, PR negative, HER2 negative infiltrating ductal carcinoma of the left breast.   She is now on surveillance.     She underwent left partial mastectomy and SLNB on January 24, 2021.  Just prior she received doxorubicin and cyclophosphamide (cycle 4 on December 08, 2020) following 4 cycles of weekly paclitaxel and carboplatin as neoadjuvant chemotherapy.      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    In February 2022 she began to experience discomfort in the left breast.  She first attributed this to having been struck with a part of her dog when she picked it up about 1 week earlier.  She continued to experience discomfort intermittently. She scheduled a screening mammogram, having not had an exam since July 2018.         Myelodysplastic or myeloproliferative disease   01/19/2014 Initial Diagnosis    MPN (myeloproliferative neoplasm)     07/15/2020 -  Chemotherapy    Began cycle 1 of weekly paclitaxel and carboplatin on July 15, 2020.  Immunotherapy with pembrolizumab was not recommended due to her previous allogenic bone marrow transplant.  Final cycle (4) of paclitaxel and carboplatin was administered on September 30, 2020.  Began dose dense cyclophosphamide and doxorubicin on October 06, 2020    OP Breast CARBOplatin Weekly Paclitaxel then [facility]   Plan Provider: Alison Murray, MD  Treatment goal: Neoadjuvant  Line of treatment: [No plan line of treatment]     IDC left 2 o'clock 2.5 cm, ER/PR- her 2-   05/18/2020 Significant Radiology  Findings    Screening [place]: scattered tissue, architectural distortion left 2 o'clock. BIRAD 0       06/07/2020 Significant Radiology Findings    Ultrasound [place]: irregular hypoechoic mass left 2 o'clock, 19x19x12 mm, 25 mm radial. middle third upper outer quadrant of the left breast. BIRAD 4  Ultrasound guided core IDC     06/09/2020 Procedure    ultrasound-guided core biopsy of the left breast 2:00 lesion:   Biopsy revealed invasive ductal carcinoma.  Provisional combined Nottingham grade was 3 of 3 (tubule formation score 3, nuclear grade 3, mitotic rate score 2).  Tumor cells were negative for estrogen receptors and progesterone receptors.  Tumor cells were negative for HER2 by immunohistochemical staining with a staining score of 1+.  Ki-67 staining was seen in approximately 70% of tumor cells.        06/13/2020 Consultation    She was seen in the Hereditary Oncology clinic :  Because of her prior allogeneic bone marrow transplant, she was not eligible for standard germline testing with blood or saliva specimen.       06/15/2020 Initial Diagnosis    IDC left 2 o'clock 2.5 cm, ER/PR- her 2-     06/15/2020 Cancer Staged    Staging form: Breast, AJCC 8th Edition  - Clinical stage from 06/15/2020: Stage IIB (cT2, cN0, cM0, G3, ER-, PR-, HER2-)  06/15/2020 Consultation    Surgical consultation:  Dr. Claris Gower      06/20/2020 Significant Radiology Findings    Whole-body bone scan : No abnormal focus of radiotracer uptake to suggest osseous metastatic disease.     06/22/2020 Significant Radiology Findings    CT of the chest: mill-defined enhancing lesion in the lateral left breast measuring 1.7 x 1.2 x 1.1 cm.  There were asymmetrically prominent level 1 left axillary lymph nodes, enhancement of uncertain significance, possibly representing metastatic disease. No enlarged mediastinal, hilar, intramammary,  supraclavicular lymph nodes or suspicious pulmonary nodules were identified.       06/22/2020 Significant  Radiology Findings    CT of the abdomen pelvis : no evidence of metastatic disease . small fat filled umbilical hernia noted.     06/28/2020 Significant Radiology Findings    MRI of the breast: irregular mass with spiculated margins measuring 10 x 25 x 19 mm in the left breast upper outer quadrant at the 2 o'clock position located 3 cm from the nipple.      07/15/2020 -  Chemotherapy    Began cycle 1 of weekly paclitaxel and carboplatin on July 15, 2020.  Immunotherapy with pembrolizumab was not recommended due to her previous allogenic bone marrow transplant.  Final cycle (4) of paclitaxel and carboplatin was administered on September 30, 2020.  Began dose dense cyclophosphamide and doxorubicin on October 06, 2020    OP Breast CARBOplatin Weekly Paclitaxel then [facility]   Plan Provider: Alison Murray, MD  Treatment goal: Neoadjuvant  Line of treatment: [No plan line of treatment]     08/18/2020 Procedure    PowerPort in the right internal jugular vein.     12/21/2020 Significant Radiology Findings    Left breast mammogram and ultrasound on December 21, 2020:  Impression:  Mass in the left breast upper outer quadrant at 2 o'clock located 8 centimeters from the nipple is a known malignancy. Surgical consultation is recommended. Patient completed chemotherapy treatment on 12/08/2020. Patient is scheduled for left lumpectomy on 01/24/2021.     Finding 2:  Normal lymph node and biopsy clip in the left axilla is benign.     Finding 3:  Stable calcifications in the left breast are benign.        01/04/2021 Significant Radiology Findings    MRI breasts on December 15, 2020:  Previously seen irregular spiculated enhancing mass in the left breast does not demonstrate significant residual enhancement. A biopsy clip is noted along the medial margin of the previously seen mass.  There is no axillary or internal mammary lymphadenopathy. In the right breast, there is no evidence of any mass or suspicious enhancements.  Impression:  Area in  the left breast is a known malignancy. No significant residual enhancement in the area of previously seen mass, consistent with excellent/complete radiologic response.        01/24/2021 Surgery    Wire localized partial mastectomy and sentinel lymph node biopsy- Sheliah Mends    Complete pathologic response.      02/07/2021 Cancer Staged    Staging form: Breast, AJCC 8th Edition  - Pathologic stage from 02/07/2021: No Stage Recommended (ypT0, pN0, cM0, GX)         INTERVAL HISTORY:  Carly Higgins was last seen in our office on September 14, 2021.   Since that time:    She underwent left hip arthroplasty on October 20, 2021.  This went well.  Now experiencing pain in right hip; considering right  hip surgery; she has followup with Ortho on June 6.    She spent the winter in [place]; returning to [place] 2 weeks ago.     Implanted central venous access port is still in place.  Will consider removal in the fall.    She had follow up with surgical oncology just prior to this visit today.  At that visit an ECG was performed after irregular rhythm noted on exam.  Felt to be atrial fibrillation.  Patient is stable.  PCP notified.    At this time she denies palpitations.  She does experience windedness randomly for several seconds after which she feels fine.  She reports compliance with her metoprolol and ASA 81 mg.  She most recently saw her PCP on May 31 when she discussed her concern for profuse sweating.  Thyroid levels checked.  Trial of Drysol recommended.  Encouraged to follow-up with hematology.  Additionally she requested to wean off Cymbalta (neuropathy, depression anxiety) and resume Lexapro.    Most recent mammogram was in September 2023; no concerns.  She has no breast related concerns at this time.  She had CBE with surgical oncology just prior to this visit.    Her appetite is good.  She reports significant weight gain and increase in hemoglobin A1c after stopping Mounjaro.  [name] was resumed on June 1.   She  reports ongoing daily morning nausea for which ondansetron has been helpful.  She has experienced this since her BMT.  She has not vomited.  She denies diarrhea or constipation.  Her energy level has been pretty good.  She has not had any recent fevers or symptoms of infection.  She denies respiratory symptoms.  She is not experiencing any urinary symptoms.       Prior to beginning chemotherapy she had baseline numbness in her first toe bilaterally.  Neuropathy symptoms worsened in her feet with paclitaxel.  Today she reports that she is still experiencing prickly discomfort and pain in her toes.  She had tried gabapentin and stopped this after she did not find it to be helpful.  She tried acupuncture X 3 did not feel it was helpful.  Had a foot massage once which was calming but did not help with neuropathy.  She would be interested in any recommendations or participation in any studies for peripheral neuropathy.    ROS: As outlined above: comprehensive review of systems is otherwise negative.     Immunization History  She reports that she received annual flu vaccine on October 25, 2020.     Administered Date(s) Administered    COVID-19 MRNA Bivalent Vaccine Booster Pfizer-Biontech 30 mcg/0.78mL IM SUSP 10/25/2020    Covid-19 mRNA vaccine (PFIZER) IM 30 mcg/0.48mL 05/15/2019, 06/05/2019, 10/08/2019    DTaP(Infanrix) 08/01/2016, 09/25/2016, 11/22/2016    Hepatitis B Adult 08/01/2016, 09/25/2016, 02/21/2017, 08/15/2017    HiB(Acthib/Hiberix) 08/01/2016, 09/25/2016, 11/22/2016    IPV 08/01/2016, 09/25/2016, 11/22/2016    Meningococcal Conjugate MCV4O (Menveo) 08/01/2016    Pneumococcal 13-valent Conjugate (Prevnar 13) 08/01/2016, 09/25/2016, 11/22/2016    Pneumococcal Polysaccharide (Pneumovax) 02/21/2017, 08/15/2017    Zoster(Shingrix) 02/21/2017, 08/15/2017       PAST MEDICAL/SURGICAL HISTORY:   Status 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  Prior to bone marrow transplant she received 5 cycles of decitabine.  She received fludarabine, busulfan and and hydroxyurea for the transplant and received methotrexate for graft-versus-host prophylaxis.  Hypothyroid.  Anxiety.  Osteoarthritis of the hips, left greater than right.  Mild diverticulosis of the sigmoid colon.  History of atrial fibrillation and congestive heart failure in 2017 related to the bone marrow transplant.  Right cataract surgery.  Right rotator cuff surgery.    MEDICATIONS:     Current Outpatient Medications   Medication Sig    tirzepatide [facility]) 2.5 mg/0.70mL pen Inject 2.5 mg into the skin every 7 days.    DULoxetine (CYMBALTA) 30 mg DR capsule Alternate every other day with the 60mg  to wean off and after 3 weeks go to one a day fr 3 weeks and then go to 30mg  alternating with nothing for three weeks and then stop.    aluminum chloride (DRYSOL) 20 % external solution Apply topically nightly. to the following areas: axilla and wash off then next am    ondansetron (ZOFRAN) 4 mg tablet TAKE 1 TABLET(4 MG) BY MOUTH THREE TIMES DAILY AS NEEDED    DULoxetine (CYMBALTA) 60 mg DR capsule TAKE 1 CAPSULE(60 MG) BY MOUTH DAILY FOR MAJOR DEPRESSION    levothyroxine (SYNTHROID, LEVOTHROID) 50 mcg tablet TAKE 1 TABLET BY MOUTH EVERY DAY    metoprolol tartrate (LOPRESSOR) 50 mg tablet TAKE 1 TABLET(50 MG) BY MOUTH TWICE DAILY    acyclovir (ZOVIRAX) 400 mg tablet TAKE 1 TABLET(400 MG) BY MOUTH TWICE DAILY    aspirin 81 mg EC tablet Take 1 tablet (81 mg total) by mouth daily  Please do not continue taking this medication until after you finish your 6 weeks of eliquis for DVT prophylaxis.    atorvastatin (LIPITOR) 40 mg tablet TAKE 1 TABLET BY MOUTH IN THE EVENING    nystatin (MYCOSTATIN) 100000 UNIT/GM cream Apply topically 2 times daily  to the following areas: apply to the rash under the right breast (Patient taking differently: Apply topically 2  times daily for rash. to the following areas: apply thin layer to the rash under the right breast prn)     OBJECTIVE FINDINGS:    Vitals:    06/11/22 1000   BP: 126/76   Pulse: (!) 128 (see interval history)   Temp: 35.5 C (95.9 F)   TempSrc: Temporal   SpO2: 99%; room air   Weight: 121.5 kg (267 lb 13.7 oz); + 13.0 kg since visit June 2023 (see interval history)     Pain    06/11/22 1000   PainSc:   0 - No pain       PHYSICAL EXAMINATION:     General: Reveals a well-appearing woman; pleasant and engaged. ECOG performance status is 1-2 (due to hip pain).    HEENT : grossly unremarkable; notable for regrowth of scalp hair.  Sclerae are anicteric. Conjunctivae are pink.    Lymph nodes: There is no palpable cervical, supraclavicular, axillary or inguinal lymph node enlargement.   Chest: clear to auscultation and percussion.  PowerPort is present in the upper chest on the right; site is benign.    Cardiovacular: regularly irregular.  No murmur    Breasts: deferred; had CBE with surgical oncology Carly Higgins, [place])  just prior to this visit today.   Abdomen: is soft,non tender  Bowel sounds are +  All quadrants.no hepatosplenomegaly, mass or ascites detected .   Extremities: are without edema. There is not evidence of lymphedema.   Neurologic exam: is grossly nonfocal.   Skin: is warm and dry without obvious rash, petechiae or ecchymoses. patches of vitiligo in the upper abdomen.    LABORATORY DATA:   No  blood work ordered for this visit    IMAGING DATA:   Bilateral mammogram on September 14, 2021   There are scattered areas of fibroglandular densities.   Finding 1:  Follow-up examination was performed for the area of architectural distortion in the left breast, upper outer quadrant seen on 06/07/2020. On the present examination, there is a new post-lumpectomy scar in the anterior of the left breast upper outer quadrant at 2 o'clock.   Finding 2:  There is a new post-surgical scar in the left axilla.   In the right  breast, no new or suspicious masses, calcifications or other abnormalities are seen.    Impression: No mammographic evidence of malignancy.      ASSESSMENT:     Willma is now 2 years post diagnosis of stage IIB triple negative left breast cancer and 17 months  s/p left seed loc partial mastectomy with SLNBx; she had PCR after chemo therapy.  Based on review of systems and physical exam, she is without obvious evidence to suggest recurrence at this time.     She had follow up with surgical oncology just prior to this visit today.  At that visit an ECG was performed after irregular rhythm noted on exam.  Felt to be atrial fibrillation. PCP notified; she will be seeing him later today.  At this time she denies palpitations.  She does report windedness randomly for several seconds after which she feels fine.  She reports compliance with her metoprolol and ASA 81 mg.  She is feeling well today.      She still has implanted central venous access port in place.  I will remind her that this should be flushed minimally every 8-12 weeks.  She would like to wait until the fall to have it removed.    PLAN:     Clinical stage IIB triple negative left breast cancer    Continue surveillance     Annual bilateral mammogram due September 2024    Schedule Mediport flush (due now). Should be done every 2-3 months.      Consider removing Mediport since no further treatment planned at this time.     Follow up here in 6 months; prior to heading to [place] for the winter.    Peripheral neuropathy    Did not receive benefit from acupuncture X 3    She stopped Gabepentin (as prescribed by PCP); did not find effective    Will notify her when new chemotherapy induced peripheral neuropathy study opens (as not eligible for first one based on BMI).    Will check with integrative oncology program regarding group sessions for peripheral neuropathy.    COVID-19     Patient has received the Pfizer COVID-19 vaccine, receiving the first dose on May 15, 2019, second dose on Jun 05, 2019  booster dose on October 08, 2019 and the COVID-19 MRNA Bivalent Vaccine Booster Pfizer-Biontech on  October 25, 2020.    Patient advised to continue to take precautions to reduce risk of contracting COVID-19, including social distancing, frequent handwashing and using a mask when in public.      I personally spent 45 minutes minutes on the calender day of the encounter, including pre and post visit work.    Caren Macadam ANP, BC          xc:       Phineas Inches, MD              Sheliah Mends, MD  Joeseph Amor, MD

## 2022-06-11 ENCOUNTER — Ambulatory Visit: Payer: Medicare Other

## 2022-06-11 ENCOUNTER — Ambulatory Visit: Payer: Medicare Other | Admitting: General Surgery

## 2022-06-11 ENCOUNTER — Encounter: Payer: Self-pay | Admitting: Gastroenterology

## 2022-06-11 ENCOUNTER — Ambulatory Visit: Payer: Medicare Other | Attending: Hematology and Oncology | Admitting: Oncology

## 2022-06-11 ENCOUNTER — Other Ambulatory Visit: Payer: Self-pay

## 2022-06-11 ENCOUNTER — Ambulatory Visit: Payer: Self-pay | Admitting: Internal Medicine

## 2022-06-11 ENCOUNTER — Telehealth: Payer: Self-pay | Admitting: General Surgery

## 2022-06-11 ENCOUNTER — Telehealth: Payer: Self-pay | Admitting: Internal Medicine

## 2022-06-11 ENCOUNTER — Encounter: Payer: Self-pay | Admitting: Internal Medicine

## 2022-06-11 VITALS — BP 126/76 | HR 128 | Temp 95.9°F | Wt 267.1 lb

## 2022-06-11 VITALS — BP 124/68 | HR 88 | Ht 66.0 in | Wt 267.0 lb

## 2022-06-11 VITALS — BP 126/76 | HR 128 | Temp 95.9°F | Wt 267.9 lb

## 2022-06-11 DIAGNOSIS — Z09 Encounter for follow-up examination after completed treatment for conditions other than malignant neoplasm: Secondary | ICD-10-CM

## 2022-06-11 DIAGNOSIS — Z9481 Bone marrow transplant status: Secondary | ICD-10-CM

## 2022-06-11 DIAGNOSIS — I499 Cardiac arrhythmia, unspecified: Secondary | ICD-10-CM | POA: Insufficient documentation

## 2022-06-11 DIAGNOSIS — C50412 Malignant neoplasm of upper-outer quadrant of left female breast: Secondary | ICD-10-CM | POA: Insufficient documentation

## 2022-06-11 DIAGNOSIS — Z803 Family history of malignant neoplasm of breast: Secondary | ICD-10-CM | POA: Insufficient documentation

## 2022-06-11 DIAGNOSIS — D849 Immunodeficiency, unspecified: Secondary | ICD-10-CM

## 2022-06-11 DIAGNOSIS — Z171 Estrogen receptor negative status [ER-]: Secondary | ICD-10-CM | POA: Insufficient documentation

## 2022-06-11 DIAGNOSIS — Z789 Other specified health status: Secondary | ICD-10-CM

## 2022-06-11 DIAGNOSIS — I4892 Unspecified atrial flutter: Secondary | ICD-10-CM

## 2022-06-11 DIAGNOSIS — Z9889 Other specified postprocedural states: Secondary | ICD-10-CM

## 2022-06-11 DIAGNOSIS — Z08 Encounter for follow-up examination after completed treatment for malignant neoplasm: Secondary | ICD-10-CM | POA: Insufficient documentation

## 2022-06-11 DIAGNOSIS — G629 Polyneuropathy, unspecified: Secondary | ICD-10-CM

## 2022-06-11 DIAGNOSIS — Z923 Personal history of irradiation: Secondary | ICD-10-CM

## 2022-06-11 MED ORDER — APIXABAN 5 MG PO TABS *I*
5.0000 mg | ORAL_TABLET | Freq: Two times a day (BID) | ORAL | 3 refills | Status: DC
Start: 2022-06-11 — End: 2022-12-21

## 2022-06-11 MED ORDER — SODIUM CHLORIDE 0.9 % INJ (FLUSH) WRAPPED *I*
10.0000 mL | Status: DC | PRN
Start: 2022-06-11 — End: 2022-06-11
  Administered 2022-06-11: 10 mL

## 2022-06-11 MED ORDER — HEPARIN LOCK FLUSH 10 UNIT/ML IJ SOLN WRAPPED *I*
50.0000 [IU] | INTRAVENOUS | Status: DC | PRN
Start: 2022-06-11 — End: 2022-06-11
  Administered 2022-06-11: 50 [IU]

## 2022-06-11 NOTE — Progress Notes (Signed)
[facility]  [address]  [place], [place] [address]                  SUBJECTIVE:       Reason For Visit:   Chief Complaint   Patient presents with    Atrial Fibrillation     Was at an ov this morning and provider calling saying that she was in afib.  Has some SOB and palpitations at times.  On and off./kk     Has SOB now   She is not having palpitations   No chest pain   No other changes   No issues     Sweating still happening   But unrelated to the breathing       Irregular rate and rhythm was detected at her oncology visit and they did an EKG which they thought looked like A-fib  Copies were sent to me but is hard to delineate  We did get an EKG here in the office which showed atrial flutter 2 1      Review of Systems   Constitutional:  Negative for chills, fever and malaise/fatigue.   Respiratory:  Positive for shortness of breath.    Cardiovascular:  Negative for chest pain, palpitations, orthopnea, leg swelling and PND.   Gastrointestinal:  Negative for abdominal pain, heartburn and nausea.   Genitourinary: Negative.    Musculoskeletal: Negative.          All history family/social/allergies/HM/PMHx/PSHx was reviewed   OBJECTIVE      Vitals:    06/11/22 1440   BP: 124/68   Pulse: 88   Weight: 121.1 kg (267 lb)   Height: 1.676 m (5\' 6" )     Body mass index is 43.09 kg/m.        Physical Exam  Constitutional:       Appearance: Normal appearance. She is obese.   HENT:      Head: Normocephalic.   Cardiovascular:      Rate and Rhythm: Regular rhythm. Tachycardia present.   Pulmonary:      Comments: Clear to auscultation  Abdominal:      Tenderness: There is no abdominal tenderness.   Musculoskeletal:      Right lower leg: No edema.      Left lower leg: No edema.   Lymphadenopathy:      Cervical: No cervical adenopathy.   Neurological:      Mental Status: She is alert.             A&P:       Atrial flutter  Eliquis 5 mg twice daily  Increase metoprolol to 75 mg twice  daily  Echocardiogram  Follow-up with EP  Hydration  Sleep study    Educated patient on atrial flutter and potential causes  She knows to call if any issues arise    Total time include documentation 26 minutes         Rhylen Pulido G CAHN-HIDALGO, MD6/3/20243:01 PM

## 2022-06-11 NOTE — Telephone Encounter (Signed)
Office Calling to request call back from Sam in regards to Pt. and also request EKG be sent over.

## 2022-06-11 NOTE — Progress Notes (Signed)
The patient arrived to the quick chair today for port flush. IVAD accessed with a 19g, 3/4" huber needle without any difficulty. The port was flushed with normal saline; positive blood return noted. The port site is benign. Heparin instilled. The port was then de-accessed per protocol. The patient is in stable condition at the time of discharge.    Trixie Deis, RN

## 2022-06-11 NOTE — Patient Instructions (Signed)
Metoprolol 75 mg twice a day  1-1/2 pills twice a day    Start the Eliquis 5 mg twice a day    I will get an echocardiogram scheduled  I will probably get a referral to see Dr. Lonni Fix for follow-up evaluation of atrial flutter.    Any question concerns reach out to me    [facility] you feel better  [facility]

## 2022-06-11 NOTE — Telephone Encounter (Signed)
Called Carly Higgins to set up appt EKG needed. She said they did and EKG this am.  [facility] will review and we will get back to her.

## 2022-06-11 NOTE — Telephone Encounter (Signed)
PA from [facility] called.  She was there for an appt and she believes she is in AFIB.  She is asking [place] to follow up with her.  She went right to another appt but will call he back shortly.      Should I bring her in or ask her specific questions?

## 2022-06-11 NOTE — Progress Notes (Signed)
[facility] at [facility]    [facility] at [facility]  Patient Name: Carly Higgins  MRN: [id number]  DOB: 27-Nov-1960  Date of Service: 06/11/2022  Provider Name: Rosalita Levan PA-C    HPI: Carly Higgins is a lovely 62 y.o. woman seen on 06/11/2022  at [facility] for routine follow-up. She is currently 1 year 5 months S/P seed loc partial mastectomy and sentinel lymph node biopsy for  Cancer Staging   IDC left 2 o'clock 2.5 cm, ER/PR- her 2-  Staging form: Breast, AJCC 8th Edition  - Clinical stage from 06/15/2020: Stage IIB (cT2, cN0, cM0, G3, ER-, PR-, HER2-) - Signed by Sheliah Mends, MD on 06/15/2020  - Pathologic stage from 02/07/2021: No Stage Recommended (ypT0, pN0, cM0, GX) - Signed by Sheliah Mends, MD on 02/07/2021  .  She completed her chemotherapy in 2022. She then went on to receive  breast radiation [facility]), completed 05/10/2021. Today she feels well, but states that she has been sweating profusely for the last year, and that currently her has had intermittent bouts of dizziness and short of breast that comes and goes without any reason. She states that she is overall more tired also.     She denies any new breast concerns including: palpable breast findings, nipple discharge, skin changes or breast pain. She complains of no energy. She denies arm issues including heaviness, swelling, infections, changes in how her clothes or jewelry fit, range of motion issues.    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    In February 2022 she began to experience discomfort in the left breast.  She first attributed this to having been struck with a part of her dog when she picked it up about 1 week earlier.  She continued to experience discomfort intermittently. She scheduled a screening mammogram, having not had an exam since July  2018.         Myelodysplastic or myeloproliferative disease   01/19/2014 Initial Diagnosis    MPN (myeloproliferative neoplasm)     07/15/2020 -  Chemotherapy    Began cycle 1 of weekly paclitaxel and carboplatin on July 15, 2020.  Immunotherapy with pembrolizumab was not recommended due to her previous allogenic bone marrow transplant.  Final cycle (4) of paclitaxel and carboplatin was administered on September 30, 2020.  Began dose dense cyclophosphamide and doxorubicin on October 06, 2020    OP Breast CARBOplatin Weekly Paclitaxel then [facility]   Plan Provider: Alison Murray, MD  Treatment goal: Neoadjuvant  Line of treatment: [No plan line of treatment]     IDC left 2 o'clock 2.5 cm, ER/PR- her 2-   05/18/2020 Significant Radiology Findings    Screening [place]: scattered tissue, architectural distortion left 2 o'clock. BIRAD 0       06/07/2020 Significant Radiology Findings    Ultrasound [place]: irregular hypoechoic mass left 2 o'clock, 19x19x12 mm, 25 mm radial. middle third upper outer quadrant of the left breast. BIRAD 4  Ultrasound guided core IDC     06/09/2020 Procedure    ultrasound-guided core biopsy of the left breast 2:00 lesion:   Biopsy revealed invasive ductal carcinoma.  Provisional combined Nottingham grade was 3 of 3 (tubule formation score 3, nuclear grade 3, mitotic rate score 2).  Tumor cells were negative for estrogen receptors and progesterone receptors.  Tumor cells  were negative for HER2 by immunohistochemical staining with a staining score of 1+.  Ki-67 staining was seen in approximately 70% of tumor cells.        06/13/2020 Consultation    She was seen in the Hereditary Oncology clinic :  Because of her prior allogeneic bone marrow transplant, she was not eligible for standard germline testing with blood or saliva specimen.       06/15/2020 Initial Diagnosis    IDC left 2 o'clock 2.5 cm, ER/PR- her 2-     06/15/2020 Cancer Staged    Staging form: Breast, AJCC 8th Edition  - Clinical stage from  06/15/2020: Stage IIB (cT2, cN0, cM0, G3, ER-, PR-, HER2-)     06/15/2020 Consultation    Surgical consultation:  Dr. Claris Gower      06/20/2020 Significant Radiology Findings    Whole-body bone scan : No abnormal focus of radiotracer uptake to suggest osseous metastatic disease.     06/22/2020 Significant Radiology Findings    CT of the chest: mill-defined enhancing lesion in the lateral left breast measuring 1.7 x 1.2 x 1.1 cm.  There were asymmetrically prominent level 1 left axillary lymph nodes, enhancement of uncertain significance, possibly representing metastatic disease. No enlarged mediastinal, hilar, intramammary,  supraclavicular lymph nodes or suspicious pulmonary nodules were identified.       06/22/2020 Significant Radiology Findings    CT of the abdomen pelvis : no evidence of metastatic disease . small fat filled umbilical hernia noted.     06/28/2020 Significant Radiology Findings    MRI of the breast: irregular mass with spiculated margins measuring 10 x 25 x 19 mm in the left breast upper outer quadrant at the 2 o'clock position located 3 cm from the nipple.      07/15/2020 -  Chemotherapy    Began cycle 1 of weekly paclitaxel and carboplatin on July 15, 2020.  Immunotherapy with pembrolizumab was not recommended due to her previous allogenic bone marrow transplant.  Final cycle (4) of paclitaxel and carboplatin was administered on September 30, 2020.  Began dose dense cyclophosphamide and doxorubicin on October 06, 2020    OP Breast CARBOplatin Weekly Paclitaxel then [facility]   Plan Provider: Alison Murray, MD  Treatment goal: Neoadjuvant  Line of treatment: [No plan line of treatment]     08/18/2020 Procedure    PowerPort in the right internal jugular vein.     12/21/2020 Significant Radiology Findings    Left breast mammogram and ultrasound on December 21, 2020:  Impression:  Mass in the left breast upper outer quadrant at 2 o'clock located 8 centimeters from the nipple is a known malignancy. Surgical  consultation is recommended. Patient completed chemotherapy treatment on 12/08/2020. Patient is scheduled for left lumpectomy on 01/24/2021.     Finding 2:  Normal lymph node and biopsy clip in the left axilla is benign.     Finding 3:  Stable calcifications in the left breast are benign.        01/04/2021 Significant Radiology Findings    MRI breasts on December 15, 2020:  Previously seen irregular spiculated enhancing mass in the left breast does not demonstrate significant residual enhancement. A biopsy clip is noted along the medial margin of the previously seen mass.  There is no axillary or internal mammary lymphadenopathy. In the right breast, there is no evidence of any mass or suspicious enhancements.  Impression:  Area in the left breast is a known malignancy. No significant residual enhancement in  the area of previously seen mass, consistent with excellent/complete radiologic response.        01/24/2021 Surgery    Wire localized partial mastectomy and sentinel lymph node biopsy- Sheliah Mends    Complete pathologic response.      02/07/2021 Cancer Staged    Staging form: Breast, AJCC 8th Edition  - Pathologic stage from 02/07/2021: No Stage Recommended (ypT0, pN0, cM0, GX)         PAST MEDICAL AND SURGICAL HISTORY, SOCIAL AND FAMILY HISTORY: These were documented on the patient intake form and reviewed with the patient and notes no significant changes since her last visit with [place].     MEDICATIONS:   Current Outpatient Medications   Medication Sig Note    escitalopram (LEXAPRO) 5 mg tablet Take 1 tablet (5 mg total) by mouth daily. Weaning off Cymbalta and going back on lexapro.     tirzepatide [facility]) 2.5 mg/0.77mL pen Inject 2.5 mg into the skin every 7 days.     aluminum chloride (DRYSOL) 20 % external solution Apply topically nightly. to the following areas: axilla and wash off then next am     ondansetron (ZOFRAN) 4 mg tablet TAKE 1 TABLET(4 MG) BY MOUTH THREE TIMES DAILY AS NEEDED     levothyroxine  (SYNTHROID, LEVOTHROID) 50 mcg tablet TAKE 1 TABLET BY MOUTH EVERY DAY     metoprolol tartrate (LOPRESSOR) 50 mg tablet TAKE 1 TABLET(50 MG) BY MOUTH TWICE DAILY     acyclovir (ZOVIRAX) 400 mg tablet TAKE 1 TABLET(400 MG) BY MOUTH TWICE DAILY     aspirin 81 mg EC tablet Take 1 tablet (81 mg total) by mouth daily  Please do not continue taking this medication until after you finish your 6 weeks of eliquis for DVT prophylaxis.     atorvastatin (LIPITOR) 40 mg tablet TAKE 1 TABLET BY MOUTH IN THE EVENING     nystatin (MYCOSTATIN) 100000 UNIT/GM cream Apply topically 2 times daily  to the following areas: apply to the rash under the right breast (Patient taking differently: Apply topically 2 times daily for rash. to the following areas: apply thin layer to the rash under the right breast prn) 10/11/2021: prn    DULoxetine (CYMBALTA) 30 mg DR capsule Alternate every other day with the 60mg  to wean off and after 3 weeks go to one a day fr 3 weeks and then go to 30mg  alternating with nothing for three weeks and then stop. (Patient not taking: Reported on 06/11/2022)     DULoxetine (CYMBALTA) 60 mg DR capsule TAKE 1 CAPSULE(60 MG) BY MOUTH DAILY FOR MAJOR DEPRESSION (Patient not taking: Reported on 06/11/2022)      No current facility-administered medications for this visit.       ALLERGIES: has No Known Allergies (drug, envir, food or latex).    REVIEW OF SYSTEMS: Comprehensive ROS was taken on the patient intake form and reviewed with the patient.  Pertinent items are noted in HPI.    PHYSICAL EXAMINATION:  Vitals: BP 126/76   Pulse (!) 128   Temp 35.5 C (95.9 F) (Temporal)   Wt 121.2 kg (267 lb 1.4 oz)   SpO2 99% Comment: ROOM AIR  BMI 43.11 kg/m   General appearance: alert, appears stated age, and cooperative, diaphoretic   Neck: no adenopathy, no carotid bruit, no JVD, supple, symmetrical, trachea midline, and thyroid not enlarged, symmetric, no tenderness/mass/nodules  Back: symmetric, no curvature. ROM normal. No  CVA tenderness.  Breasts: Comprehensive breast examination was  performed in the seated and supine positions. The breasts are of large size and markedly ptotic/pendulous. The breasts are symmetric with normal shape and contour. The skin is without erythema, edema, nodules or other lesions. Nipples are normal and everted.There are no skin changes on arm maneuvers. On palpation there are no masses palpable in either breast. No discharge is expressible.   Lungs: clear to auscultation bilaterally  Heart: regularly irregular rhythm  Abdomen: Soft, nontender, nondistended, no organomegaly, no palpable masses.  Extremities: extremities normal, atraumatic, no cyanosis or edema. There is not evidence of lymphedema. ROM is within normal limits  Pulses: 2+ and symmetric  Skin: Skin color, texture, turgor normal. No rashes or lesions  Lymph nodes: Cervical, supraclavicular, and axillary nodes normal.  Neurologic: Grossly normal      LABS:    Lab Results   Component Value Date/Time    VID25 27 (L) 05/03/2017 08:16 AM         ASSESSMENT: BLESSIN GEERTS is a 62 y.o. female now 1 year and 5 months  s/p Left seed loc partial mastectomy with SLNBx who had a PCR after chemo therapy. There is no evidence of local regional recurrence at the visit today.     PLAN:  1) Surveillance plan: Tania continues to be seen on an every 3 month basis with bilateral mammogram annually. Due for  bilateral diagnostic imaging on 09/17/2022, she will follow up with the surgical team prior to her going to [place] in Dec.     Medical oncology: Dr.Yirnec 11/19/2022  Radiation oncology: deferred to her provider in [facility]    2)EKGs done today at the visit, appears to the writer to be A.Fib with heart rate from 120-106. She is stable and we discussed that I would call her PCP, discussed her appearance and her EKG and he plans on following up with the patient       Rosalita Levan, [place]  NPI # [id number]  Pager # [phone]

## 2022-06-12 ENCOUNTER — Encounter: Payer: Self-pay | Admitting: Cardiology

## 2022-06-14 ENCOUNTER — Other Ambulatory Visit: Payer: Self-pay

## 2022-06-14 ENCOUNTER — Encounter: Payer: Self-pay | Admitting: Orthopedic Surgery

## 2022-06-14 ENCOUNTER — Ambulatory Visit
Admission: RE | Admit: 2022-06-14 | Discharge: 2022-06-14 | Disposition: A | Discharge status: Home / Self Care | Payer: Medicare Other | Source: Ambulatory Visit

## 2022-06-14 ENCOUNTER — Telehealth: Payer: Self-pay | Admitting: Pharmacist

## 2022-06-14 ENCOUNTER — Encounter: Payer: Self-pay | Admitting: Oncology

## 2022-06-14 ENCOUNTER — Ambulatory Visit: Payer: Medicare Other | Attending: Orthopedic Surgery | Admitting: Orthopedic Surgery

## 2022-06-14 ENCOUNTER — Telehealth: Payer: Self-pay | Admitting: Internal Medicine

## 2022-06-14 VITALS — BP 126/84 | HR 70 | Ht 66.0 in | Wt 267.0 lb

## 2022-06-14 DIAGNOSIS — G629 Polyneuropathy, unspecified: Secondary | ICD-10-CM

## 2022-06-14 DIAGNOSIS — Z9481 Bone marrow transplant status: Secondary | ICD-10-CM

## 2022-06-14 DIAGNOSIS — Z96642 Presence of left artificial hip joint: Secondary | ICD-10-CM

## 2022-06-14 DIAGNOSIS — M1612 Unilateral primary osteoarthritis, left hip: Secondary | ICD-10-CM

## 2022-06-14 DIAGNOSIS — M1611 Unilateral primary osteoarthritis, right hip: Secondary | ICD-10-CM

## 2022-06-14 DIAGNOSIS — M8589 Other specified disorders of bone density and structure, multiple sites: Secondary | ICD-10-CM

## 2022-06-14 DIAGNOSIS — C50412 Malignant neoplasm of upper-outer quadrant of left female breast: Secondary | ICD-10-CM

## 2022-06-14 NOTE — Progress Notes (Signed)
Carly Higgins SURGERY WORKSHEET    Surgery Location: [x]  Hospital [] [facility] [] Either location    Procedure/CPT Code:  [x] Right      [] Left     HIP  Approach: [x] Posterior      [] Anterior        [x] 27130 Total Hip Arthroplasty        [] 27122 [place]/Resection Arthroplasty          [] 27125 Hemiarthroplasty         [] 27236 Hemiarthroplasty for fracture         [] 27132 Hip Conversion, previous hip surgery converted to THA         [] 27134 Revision THA (w/ or w/o bone graft): both components  [] 27137 Revision THA (w/ or w/o bone graft): acetabular component only  [] 27138 Revision THA (w/ or w/o bone graft): femoral component only   [] 27091 Resection THA w/ or w/o insertion of spacer    [] 11981, 51 mod PLUS Insertion, non-biodegradable drug delivery implant         [] 11983 Removal and re-insertion, drug delivery implant         [] 27134 Reimplant THA   (+58 mod, if within global period of explant)    [] 11982, 51 mod PLUS Removal, non-biodegradable drug delivery implant             [] 26992 I&D Hip joint, bone cortex            [] 26990 I&D Hip joint, deep soft tissue   [] 27265 Closed reduction s/p THA w/o anesthesia   [] 27266 Closed reduction s/p THA w/anesthesia (regional or GA)   [] 27235 Percutaneous Pinning Femoral neck fx   [] 27245 IM nail Inter/Peri/Sub trochanteric Fx        [] Other:       KNEE         [] 27447 Total Knee Arthroplasty    [] Cemented    [] Cementless      [] 20985 Computer Navigation TKA         [] 27486 Single component TKA revision         [] 27446 Unicompartmental knee Arthroplasty (medial or lateral)         [] 27442 Patellofemoral Arthroplasty         [] 27487 Revision TKA   [] 27488 Resection TKA w/ or w/o insertion of spacer    [] 11981, 51 mod PLUS Insertion, non-biodegradable drug delivery implant         [] 11983 Removal and re-insertion, drug delivery implant         [] 27487 Reimplant TKA   (+58 mod, if within global period of explant)    []  11982, 51 mod PLUS Removal, non-biodegradable drug delivery implant             [] 27486, 52 mod I&D TKA w/ poly exchange            [] 27301 I&D knee, deep soft tissue   [] 27310 I&D knee, joint/arthrotomy   [] 27570 Manipulation under anesthesia   [] 20680 Removal of Hardware   [] 27385 Secondary reconstruction, quad tendon   [] 27381 Secondary reconstruction, patella tendon        [] Other:       OTHER CODES            [] 12020   Simple closure, wound dehiscence      [] 27036 HO excision    Modifiers  AS PA Assisted  22 Increased Procedural Svc  50 Bilateral  51 Multiple procedure, same provider  52 Reduced Procedural Svc  58 Planned staged procedure in global period      Length of Procedure:  [x] 2 hours, 45 min [] 4 hours  [] other:     Postoperative visit:  [x] 4 weeks [] 2 weeks  [] other:     Special Labs/Tests:    [x] CBC, SMA 12, PT/PTT, Type & Screen, Hemoglobin A1C, Serum Preg (Fem), MSSA and MRSA nasal Swab, Vitamin D    [] ESR, CRP   [] UA w/C+S   [] EKG   [] Chest XR   [] Drug screen   [] Tobacco labs: urine nicotine metabolites, serum carboxyhemoglobin   [] Other:    Anesthesia Requested:    [x] Single shot spinal (intrathecal)   [x] Sedation   [] General Anesthesia   [] Regional Blocks:   [] Adductor Canal Block   [] Fascia Iliaca Block   [] Femoral Nerve Block   [] IPACK  [] Popliteal - Sciatic Block     Equipment:   [] Cemented TKA: Zimmer Persona with MC polyethylene    [] Cementless TKA: Stryker Triathlon Tritanium               [] Cemented UKA: Zimmer Persona Partial Knee system            [] THA: Stryker Trident 2 Tritanium acetabular system, Insignia femoral system  [x] THA: Depuy Emphasys acetabular system, Actis femoral system   [] Knee Revision, implants:   [] Implant/Cement removal tools: Moreland set, Osteotomes, Microsagittal saw, Single/double sided recip saw, Fan saw blade, Reverse currettes, Midas Rex (round/pencil tipped burrs)  [] Stryker flexible reamers  [] Stryker ream only tibial  and femoral cones  [] Zimmer trabecular metal cones  [] Augments  [] Other:  [] Hip Revision, implants:   [] Implant removal tools: Midas Rex (round/pencil tipped burrs), Flexible osteotomes, ACL screw tap  [] Femoral component: Midas Rex (round/pencil tipped burrs), Flexible osteotomes, Shukla extraction system  [] ETO Equipment: Microsagittal saw, Midas Rex (6 mm round and pencil tipped burrs), long K wires, osteotomes (1/4 inch AND broad), small Gigli saws  [] Acetabular component: Hemispherical cup osteotomes  [] Cement removal tools: Moreland set, Osteotomes, Reverse currettes, Midas Rex (round/pencil tipped burrs)  [] Stryker flexible reamers  [] Stryker Dall-Miles beaded cables  [] Zimmer Trephine system  [] Zimmer trabecular metal acetabular augments  [] Other:  [] Other:      Preoperative Clearance/Consults Needed?   [x] PCP (med clearance)   [x] Anesthesia ([facility])     [] Hematology    [x] Oncology  [] Pulmonology    [] Cardiology    [] Neurology       [] Pain        [] Rheumatology   [] Other:    COMMENTS:

## 2022-06-14 NOTE — Telephone Encounter (Signed)
Seen and agree  Carly Dunnaway G CAHN-HIDALGO, MD

## 2022-06-14 NOTE — Progress Notes (Signed)
Reviewed chart and CS order; routed okay to schedule surgery to the [facility] scheduling team.  [facility] education sent out today through [employer].

## 2022-06-14 NOTE — Progress Notes (Signed)
Total Hip Replacement, Follow-Up Visit    HPI: Carly Higgins returns to the office approximately 7 months after a left total hip replacement and reports that they are doing relatively well, and overall is pleased.  She is showing satisfaction and reporting no issues with the left side. Patient mentions right hip pain, which is not as severe as the left one before surgery but is progressing in that direction. The pain also travels down the leg. The patient wants to get it treated before it worsens. The patient reports a recent incident of tripping and falling on their knees and hip while visiting a friend. However, no significant injury is reported from the fall. Patient also reports a history of neuropathy. The patient has started a weight loss medication (Mounjaro) due to recent weight gain and has been experiencing occasional breathing difficulties. His heart rate was observed to be elevated occasionally and has been put on Eliquis as a preventive measure against potential cardiac conditions. The patient is scheduled to see a cardiologist and expresses eagerness to undergo right hip surgery post this consultation, perhaps in September. For pain, the patient takes ibuprofen over-the-counter.     Pain medication: Ibuprofen as needed  Gait assistive device: none  Incision: healed  Current physical therapy: none    Current medications were updated during this office encounter, and were personally reviewed.  Above histories were obtained from external medical records and documents that were each personally reviewed.  An independent historian was not required to complete above history taking.       PHYSICAL EXAMINATION:    Left hip:  Physical examination of the left hip reveals an incision that is well healed.    The patient walks with a reasonable gait and minimal antalgia.    The hip has painless range of motion with flexion up to 90 degrees, internal rotation from 5 to 10 degrees and external rotation to 20 degrees.    The lower extremity is neurovascularly intact per the patient's baseline sensation and strength.  Right hip:   Skin: no skin lesions, rashes, or discoloration  Prior scars: none   Hip range of motion:  Flexion: 95  Internal rotation: 5  External rotation: 15   Abduction: 40    There is pain with range of motion of the hip, particularly flexion and internal rotation.  Special testing:  Stinchfield maneuver: positive    Distal neurovascular:  Motor: quadriceps, dorsiflexion, plantar flexion, and EHL intact  Sensation: sensation is intact to light touch in saphenous/sural/tibial/deep and superficial peroneal nerve distributions   Circulation: Dorsalis pedis pulses palpable and symmetric bilaterally, toes demonstrate brisk capillary refill.   Peripheral vascular skin changes/ulcers: No       STUDY REVIEW AND INTERPRETATION:     Radiographs:  X-rays obtained today, and reviewed personally, are an AP pelvis, AP and lateral of the bilateral hip and demonstrate well-fixed acetabular and femoral components in good alignment in the left hip. The radiographs reveal evidence of right hip severe, end stage degenerative changes involving the femoral-acetabular joint and include joint space narrowing, bone-on-bone articulation, peripheral osteophyte formation, and subchondral sclerosis.  No other osseous abnormalities are noted.       IMPRESSION:  Carly Higgins is a 62 y.o. female following up approximately 8 months status post left total hip arthroplasty, doing well. She now presents for severe, end stage right hip degenerative osteoarthritis, interested in proceeding with surgery given her excellent outcome to date with the left hip.  Comorbidities:  Atrial fibrillation, Ventricular tachycardia, Lumbar radiculopathy, DM2, Diverticulosis, GERD, Hx of breast cancer with chemo, Hx of recurrent MRSA infections.    Assessment/Plan:  The patient is doing well overall with regards to left hip.  The patient was reassured that minor  aches and pains are typical for hip replacements during the first year, and encouragement was provided for ongoing strengthening and normal use of the hip.  The patient was reassured intermittent pain and stiffness, as well as any other experienced issues, could continue to be present for a number of months as part of the normal healing process.  It was reiterated that ongoing clinical improvement can continue for up to one year or beyond after hip replacement.      With regards to the right hip, The patient has exhausted the conservative treatment options proposed, and continues to experience progressively debilitating pain leading to immobility, loss of function, deteriorating quality of life, and inability to perform their activities of daily living. I believe the patient is a candidate for total hip arthroplasty as a means of pain control to improve quality of life and function. I specifically discussed that the proposed surgery is ideal for treating the pain from inflammation due to joint pathology and that other extra-articular sources of pain may not be fully relieved after full recovery.  Improvements in range of motion, joint stability, and alignment are secondary reasons to perform this surgery, and improvements in these domains are less predictable. The patient and I extensively discussed the established benefits as outlined above, the risks specific to total hip replacement, and the nonsurgical and surgical alternatives to arthroplasty for long term management of their end-stage, severe joint disease with debilitating symptoms.  Furthermore, we reviewed the long term implications of undergoing joint replacement including the importance of future activity modifications to avoid high impact activity to maximize implant longevity. After expressing verbal understanding, and answering all questions, the patient agreed that surgical treatment is the next best step in treatment of their condition given the  failure of conservative treatment and the significant impact their pain and symptoms have on their quality of life.      We discussed the variety of technical strategies available to perform this procedure, as well as risks and benefits specific to each particular strategy including specific surgical approaches, alignment and implant fixation strategies, the intraoperative use of technology (fluoroscopy, computer navigation, robotic arm assistance, augmented/mixed reality).  We reviewed modern evidence on arthroplasty bearing surfaces and the risks/benefits to the various combinations of materials available.    Preoperative preparation, perioperative scheduling, postoperative expectations and rehabilitation were all reviewed in the office today.  The surgical plan was outlined. She will move forward with scheduling preoperative multidisciplinary medical evaluation, optimization, and clearance for the procedure with her primary care physician, as well as with the presurgical testing team in the preoperative testing clinic.  Any chronic medical comorbidities or modifiable risk factors that predispose the patient to or increase the risk of specific complications associated with joint replacement may require optimization prior to proceeding or scheduling surgery. In addition, she  will attend a preoperative joint replacement informational and educational teaching class.  Lastly, she  will schedule a preoperative appointment with me to reveal and address any final questions, preoperative concerns, and review informed consent for the proposed procedure.      - Patient to follow-up with cardiologist before commencing with any surgical procedure  - Patient was advised to continue with ibuprofen for pain as needed  -  Encouraged the patient to be cautious and avoid falls  - Asked the patient to maintain the physical strength of both legs by doing exercises     Comorbidities that may affect outcome/risk of surgery that may  require optimization and planning: Comorbidities: Atrial fibrillation, Ventricular tachycardia, Lumbar radiculopathy, DM2, Diverticulosis, GERD, Hx of breast cancer with chemo, Hx of recurrent MRSA infections.  Specialists to be involved in optimization:  PCP, oncologist    All questions and concerns regarding surgery were addressed to patient's satisfaction and I look forward to seeing Sophi on the day of her preoperative evaluation.     Unique studies/tests ordered today: hip xrays personally reviewed  Medical or Drug therapy ordered today: none    Next follow up: preop right hip  Studies to order for next visit:     All additional questions were answered, the patient was encouraged to call with any future questions or concerns.    Fransisca Connors, MD  Division of Adult Reconstruction  [facility]  Department of Orthopaedics and Rehabilitation     Answers submitted by the patient for this visit:  Left hip (Submitted on 06/14/2022)  Chief Complaint: Left hip pain  What is your goal for today's visit?: check up  Date of onset: : 10/20/2021  Was this the result of an injury?: No  What is your pain level?: 1/10  What diagnostic workup have you had for this condition?: no prior workup  What treatments have you tried for this condition?: home exercise program  Progression since onset: : resolved  Is this a work related condition? : No

## 2022-06-14 NOTE — Patient Instructions (Signed)
Department of Orthopedics, Dr. Jlon Betker     Scheduling Instructions:     To schedule the date and location of your surgery, call Dr. Arie Gable's office [phone] and choose option #2, or call the scheduling team directly at [phone].    If you are unable to reach a member of the scheduling team, our surgical nurse navigator team can be reached at [phone].    When you schedule your surgical date, you will also make several appointments to prepare you for joint replacement. Please use the below checklist to keep track of your appointment dates and times:    The date of your surgery:________________; The location of your surgery: [facility] or [facility]    Preoperative medical evaluation with your primary care doctor:________________  This should be scheduled 2-4 weeks before your surgery date. Dr. Bain Whichard's office will require a letter from your primary doctor indicating that it is medically safe to proceed with surgery.  If you see any specialists for your heart, lungs or kidney health, Dr. Shericka Johnstone's office will also require medical clearance from each of those providers to proceed with surgery.  The sooner these visits are completed, the more streamlined the preparation process will be.    Presurgical Testing assessment and visit with the Anesthesia department (either at [facility] or [facility]): ____________________     Preoperative appointment with Dr. Spiros Greenfeld: ____________________  If your surgery is at [facility], this should be scheduled on the same day as your Presurgical Testing visit at [facility] (#4 above)  If your surgery is at [facility], this should be scheduled on a different day, ideally after your Presurgical Testing visit at [facility], at the [place] office    **You should designate a support person/health coach to help you arrange help and assistance at home after surgery, transportation to and from the  hospital and appointments, and who will stay with you at home to help during your early recovery.**        Begin learning about joint replacement surgery and recovery!    To begin the joint replacement educational process on your own and for several resources about how you can optimize your success as a joint replacement candidate, please refer to the below website links:    [email or web address]  [email or web address]    The above resources will provide you with information such as:  General information about hip and knee pain and the conditions that cause these symptoms  A downloadable Guidebook (.pdf) to hip and knee replacement with information about:  The joint replacement procedure  How to prepare for surgery  Anesthesia options  Your hospital stay  Pain management  The recovery process  Equipment you may need after surgery  The rehabilitation exercises you can do before surgery, and that you will do after surgery  Frequently asked questions about joint replacement surgery    Other resources to help you prepare for joint replacement surgery, provided by leading academic institutions:    American Association of Hip and Knee Surgeons:  https://hipknee.aahks.org    American Academy of Orthopedic Surgeons:  https://orthoinfo.aaos.org/en/treatment/?topic=JointReplacement      Steps You Can Take to Achieve Happiness after Joint Replacement   (adapted from hipknee.aahks.org)    If you are considering hip or knee replacement surgery, here are some general guidelines you can follow to increase the likelihood that you will be satisfied after your surgery. These can help guide you in your decision-making process.      Take a more active role in your overall health before surgery. Read the article, “Good Health=Good Recovery.”  If you have anxiety/depression, ensure that you’re managing the condition before  surgery.  Reduce or eliminate the use of narcotics before and after surgery (the more pain medicine you take before surgery, the harder it is to manage your pain after surgery). It is important not to quit “cold-turkey” and for you to wean off narcotics under the direction of a medical professional.  If you have a small amount of arthritis and are getting around fairly well, continue with non-surgical options for joint pain. Read the article, “Relieving Hip and Knee Pain Without Surgery.”  Enlist a family member or friend to be your “joint replacement coach” to help you through the recovery process.

## 2022-06-14 NOTE — Telephone Encounter (Addendum)
Reviewed Duloxetine taper with patient again as she called with questions regarding this. Confirmed she has 30 mg and 60 mg tablets. Patient verbalized understanding of taper and was able to repeat the plan to me. Instructed to call us back if she has any other questions.     Jodene Nam, PharmD  PGY1 [facility] Resident  [place] [facility] [place]

## 2022-06-15 ENCOUNTER — Encounter: Payer: Self-pay | Admitting: Gastroenterology

## 2022-06-15 ENCOUNTER — Encounter: Payer: Self-pay | Admitting: Orthopedic Surgery

## 2022-06-15 NOTE — Telephone Encounter (Signed)
Amb ref integr

## 2022-06-19 ENCOUNTER — Telehealth: Payer: Self-pay

## 2022-06-19 NOTE — Telephone Encounter (Signed)
Patient called back from missed call . Patient is scheduled for her echo on 07/27/2022 at [facility].

## 2022-06-19 NOTE — Telephone Encounter (Signed)
6/11 lmom for patient to call and schedule echocardiogram ordered by PCP

## 2022-06-20 ENCOUNTER — Encounter: Payer: Self-pay | Admitting: Cardiology

## 2022-06-20 ENCOUNTER — Telehealth: Payer: Self-pay | Admitting: Cardiology

## 2022-06-20 ENCOUNTER — Other Ambulatory Visit: Payer: Self-pay

## 2022-06-20 ENCOUNTER — Ambulatory Visit: Payer: Medicare Other | Attending: Cardiology | Admitting: Cardiology

## 2022-06-20 ENCOUNTER — Ambulatory Visit
Admission: RE | Admit: 2022-06-20 | Discharge: 2022-06-20 | Disposition: A | Discharge status: Home / Self Care | Payer: Medicare Other | Source: Ambulatory Visit

## 2022-06-20 VITALS — BP 114/66 | HR 76 | Ht 66.0 in | Wt 262.8 lb

## 2022-06-20 DIAGNOSIS — I4819 Other persistent atrial fibrillation: Secondary | ICD-10-CM | POA: Insufficient documentation

## 2022-06-20 NOTE — Telephone Encounter (Signed)
MCR/EXUR-NO AUTH NEEDED

## 2022-06-20 NOTE — Progress Notes (Signed)
Carly Stalling, MD  P Amb Card Ep Support Staff; P Amb Card [name], [name]  Procedure: Cardioversion    Diagnosis: A-fib/flutter    Date (if known): 1 month.  Patient just started Eliquis.    Referring provider: Allena Katz    Antiarrhythmics/medications hold: None    Anesthesia type: moderate    Labs: CBC/BMP    Special notes/instructions: None

## 2022-06-20 NOTE — Discharge Instructions (Signed)
Instructed patient regarding cardiac monitor placed today which includes: how to replace electrodes/patch, diary and return information. Additionally, patient provided with written instructions/ FAQs. Patient verbalized understanding and is agreeable with plan.

## 2022-06-20 NOTE — Telephone Encounter (Signed)
06/12 Patient has an add on for monitor.

## 2022-06-20 NOTE — Progress Notes (Signed)
[facility]        Electrophysiology office Consult Note    Date of Consult: 06/20/2022 Patient: Carly Higgins   Patients PCP: Volanda Napoleon, MD Patient DOB: 1960-07-13  EMRN: [id number]     History of Present Illness/Reason For Visit     I had the pleasure of seeing Carly Higgins in consult on 06/20/2022. Carly Higgins is an 62 y.o. female who we were asked to see for atrial fibrillation/flutter.  She has had paroxysmal episodes in the past and more recently was found to be in atrial fibrillation/flutter once again.  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  She has hip surgery later this summer.  She has had episodes of occasional palpitations as well as diaphoresis.  However this is not always attributable to atrial arrhythmia.  LVEF has been normal in the past with repeat echo next month.  Given this recent finding she was started on Eliquis.  She reports no bleeding issues.  She reports no syncope or chest pain.  She has been exertionally limited due to hip pain.    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    Conditioned w/ Busulfan 130 mg/m2/d and Fludarabine 40 mg/m2/d x4 days followed by 10/10 HLA MUD (F) Allogeneic PBSCT (pt/donor: ABO: A+/A+, CMV N/N)    Pulmonary edema     s/p L THA 10/20/21 10/20/2021    Shortness of breath     Tobacco use      Past Surgical History:   Procedure Laterality Date    BONE MARROW TRANSPLANT  2017    Mediport insertion      PR ARTHRP ACETBLR/PROX FEM PROSTC  AGRFT/ALGRFT Left 10/20/2021    Procedure: LEFT POSTERIOR ARTHROPLASTY, HIP, TOTAL;  Surgeon: Fransisca Connors, MD;  Location: [facility];  Service: Orthopedics    PR AXILLARY LYMPHADENECTOMY COMPLETE Left 01/24/2021    Procedure: sentinel lymph node biopsy;  Surgeon: Sheliah Mends, MD;  Location: [facility];  Service: Oncology General    PR MASTECTOMY PARTIAL Left 01/24/2021    Procedure: LEFT NEEDLE LOC PARTIAL MASTECTOMY   ;  Surgeon: Sheliah Mends, MD;  Location: [facility];  Service: Oncology General    ROTATOR CUFF REPAIR Right        Medications and Allergies     Current Outpatient Medications   Medication Sig    escitalopram (LEXAPRO) 5 mg tablet Take 1 tablet (5 mg total) by mouth daily. Weaning off Cymbalta and going back on lexapro.    apixaban (ELIQUIS) 5 mg tablet Take 1 tablet (5 mg total) by mouth every 12 hours.    tirzepatide [facility]) 2.5 mg/0.54mL pen Inject 2.5 mg into the skin every 7 days.    DULoxetine (CYMBALTA) 30 mg DR capsule Alternate every other day with the 60mg  to wean off and after 3 weeks go to one a day  fr 3 weeks and then go to 30mg  alternating with nothing for three weeks and then stop.    aluminum chloride (DRYSOL) 20 % external solution Apply topically nightly. to the following areas: axilla and wash off then next am (Patient not taking: Reported on 06/11/2022)    ondansetron (ZOFRAN) 4 mg tablet TAKE 1 TABLET(4 MG) BY MOUTH THREE TIMES DAILY AS NEEDED    DULoxetine (CYMBALTA) 60 mg DR capsule TAKE 1 CAPSULE(60 MG) BY MOUTH DAILY FOR MAJOR DEPRESSION    levothyroxine (SYNTHROID, LEVOTHROID) 50 mcg tablet TAKE 1 TABLET BY MOUTH EVERY DAY    metoprolol tartrate (LOPRESSOR) 50 mg tablet TAKE 1 TABLET(50 MG) BY MOUTH TWICE DAILY (Patient taking differently: Take 1.5 tablets (75 mg total) by mouth 2 times daily.)    acyclovir (ZOVIRAX) 400 mg tablet TAKE 1 TABLET(400 MG) BY MOUTH TWICE DAILY    aspirin 81 mg EC tablet Take 1 tablet (81 mg total) by mouth daily  Please do not  continue taking this medication until after you finish your 6 weeks of eliquis for DVT prophylaxis.    atorvastatin (LIPITOR) 40 mg tablet TAKE 1 TABLET BY MOUTH IN THE EVENING    nystatin (MYCOSTATIN) 100000 UNIT/GM cream Apply topically 2 times daily  to the following areas: apply to the rash under the right breast (Patient taking differently: Apply topically 2 times daily for rash. to the following areas: apply thin layer to the rash under the right breast prn)     She has No Known Allergies (drug, envir, food or latex).    Social and Family History     Family History   Problem Relation Age of Onset    No Known Problems Mother     Throat cancer Father     No Known Problems Sister     No Known Problems Brother     Breast cancer Paternal Aunt     Ovarian cancer Neg Hx     Anesthesia problems Neg Hx      Social History     Socioeconomic History    Marital status: Divorced   Occupational History    Occupation: on disability   Tobacco Use    Smoking status: Former     Packs/day: 0.25     Years: 30.00     Additional pack years: 0.00     Total pack years: 7.50     Types: Cigarettes     Quit date: 07/2021     Years since quitting: 0.9     Passive exposure: Past    Smokeless tobacco: Never   Substance and Sexual Activity    Alcohol use: Yes     Comment: occasional    Drug use: No    Sexual activity: Not Currently   Social History Narrative        Marital status: single    Lives in a second floor apartment alone     No children    Pets:  1 dog    Hobbies and interests include nothing at this time    Physical activity: none at this time         Review of Systems     ROS as above  Vitals and Physical Exam     Carly Higgins's  height is 1.676 m (5\' 6" ) and weight is 119.2 kg (262 lb 12.8 oz). Her blood pressure is 114/66 and her pulse is 76. Her oxygen saturation is 100%.  Body mass index is 42.42 kg/m.  Physical Exam  Constitutional:       Appearance: She is well-developed.   Neck:      Vascular: No JVD.   Cardiovascular:       Rate and Rhythm: Rhythm irregular.      Heart sounds: Normal heart sounds.      Comments: Irregularly irregular  Pulmonary:      Effort: Pulmonary effort is normal.      Breath sounds: Normal breath sounds.   Neurological:      Mental Status: She is alert.       Laboratory Data     Hematology:   Results in Past 730 Days  Result Component Current Result Previous Result   WBC 6.1 (06/05/2022) 5.5 (10/12/2021)   Hemoglobin 12.5 (06/05/2022) 10.7 (L) (10/20/2021)   Hematocrit 40 (06/05/2022) 33 (L) (10/20/2021)   Platelets 266 (06/05/2022) 236 (10/12/2021)     Chemistry:   Results in Past 730 Days  Result Component Current Result Previous Result   Sodium 138 (06/05/2022) 137 (10/20/2021)   Potassium 5.2 (H) (06/05/2022) 4.3 (10/20/2021)   Creatinine 0.81 (06/05/2022) 0.68 (10/20/2021)   Glucose 175 (H) (06/05/2022) 247 (H) (10/20/2021)   Calcium 10.3 (H) (06/05/2022) 9.1 (10/20/2021)   Hemoglobin A1C 7.5 (H) (06/05/2022) 5.6 (10/11/2021)   AST 31 (06/05/2022) 23 (10/12/2021)   ALT 30 (06/05/2022) 27 (10/12/2021)   TSH 4.02 (06/05/2022) 2.51 (08/25/2021)     Coagulation Studies:   Results in Past 730 Days  Result Component Current Result Previous Result   aPTT 32.5 (10/11/2021) Not in Time Range   INR 0.9 (10/11/2021) 0.9 (09/14/2020)     Cardiac:   No results found for requested labs within last 730 days.     Lipids:   Results in Past 730 Days  Result Component Current Result Previous Result   Cholesterol 192 (12/08/2020) Not in Time Range   HDL 39 (L) (12/08/2020) Not in Time Range   Triglycerides 170 (!) (12/08/2020) Not in Time Range   LDL Calculated 119 (12/08/2020) Not in Time Range   Chol/HDL Ratio 4.9 (12/08/2020) Not in Time Range       Cardiac/Imaging Data & Risk Scores            ECHO COMPLETE 07/05/2020    Interpretation Summary   Normal left ventricular size and systolic function with no significant regional wall motion abnormalities.   Calculated LVEF is 65%.   Normal right ventricular size and function.   Aortic and mitral valve  sclerosis without stenosis.   Unable to estimate RV systolic pressure.   Borderline enlarged ascending aorta.   Compared to the prior echo (11/22/2015), the ascending aorta is now borderline enlarged.                     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 female with atrial fibrillation/flutter.  She does have nonspecific symptoms.  Metoprolol has been increased to 75 mg twice a day.  That will help with rate control hopefully in the short-term.  I would like to see if this episode is persistent and we will get a 2-week E patch monitor.  She will continue on anticoagulation.  I would like to set her up for cardioversion in about a month or so.  We will see if she recurs and may need to consider antiarrhythmic medications.  We will see what the LVEF is on her echocardiogram in the coming weeks.  Routinely we will see her back in 2 months and in the interim plan for cardioversion.         Carly Moat P. Allena Katz, MD  Electronically signed on 06/20/2022 at 3:54 PM.

## 2022-06-26 NOTE — Telephone Encounter (Signed)
Done

## 2022-06-28 ENCOUNTER — Other Ambulatory Visit: Payer: Self-pay

## 2022-06-28 DIAGNOSIS — F605 Obsessive-compulsive personality disorder: Secondary | ICD-10-CM | POA: Insufficient documentation

## 2022-06-28 DIAGNOSIS — N979 Female infertility, unspecified: Secondary | ICD-10-CM | POA: Insufficient documentation

## 2022-06-28 DIAGNOSIS — N930 Postcoital and contact bleeding: Secondary | ICD-10-CM | POA: Insufficient documentation

## 2022-06-28 DIAGNOSIS — R011 Cardiac murmur, unspecified: Secondary | ICD-10-CM | POA: Insufficient documentation

## 2022-06-29 ENCOUNTER — Telehealth: Payer: Self-pay

## 2022-06-29 NOTE — Telephone Encounter (Signed)
Called patient to schedule CV.  She would like to think about it first and will give [place] a call back once she is back in [place] next week.

## 2022-07-09 ENCOUNTER — Other Ambulatory Visit: Payer: Self-pay | Admitting: Internal Medicine

## 2022-07-09 NOTE — Telephone Encounter (Signed)
Pls call to cancel this refill   It was done in error   I did not want to sent DOxycycline in for her   [facility]   Lars Pinks, MD  12:31 PM  07/09/2022    What is going on ?  [facility]

## 2022-07-18 ENCOUNTER — Other Ambulatory Visit: Payer: Self-pay | Admitting: Internal Medicine

## 2022-07-18 DIAGNOSIS — R110 Nausea: Secondary | ICD-10-CM

## 2022-07-18 DIAGNOSIS — Z9481 Bone marrow transplant status: Secondary | ICD-10-CM

## 2022-07-19 DIAGNOSIS — I4819 Other persistent atrial fibrillation: Secondary | ICD-10-CM

## 2022-07-20 ENCOUNTER — Telehealth: Payer: Self-pay | Admitting: Hematology

## 2022-07-20 ENCOUNTER — Telehealth: Payer: Self-pay

## 2022-07-20 NOTE — Telephone Encounter (Signed)
Spoke to patient and scheduled her with Dr Corine Shelter on 7-25 at 1pm

## 2022-07-27 ENCOUNTER — Ambulatory Visit
Admission: RE | Admit: 2022-07-27 | Discharge: 2022-07-27 | Disposition: A | Discharge status: Home / Self Care | Payer: Medicare Other | Source: Ambulatory Visit | Attending: Cardiology | Admitting: Cardiology

## 2022-07-27 ENCOUNTER — Encounter: Payer: Self-pay | Admitting: Cardiology

## 2022-07-27 ENCOUNTER — Ambulatory Visit: Payer: Medicare Other

## 2022-07-27 ENCOUNTER — Other Ambulatory Visit: Payer: Self-pay

## 2022-07-27 DIAGNOSIS — I4892 Unspecified atrial flutter: Secondary | ICD-10-CM

## 2022-07-27 DIAGNOSIS — I4891 Unspecified atrial fibrillation: Secondary | ICD-10-CM

## 2022-07-27 LAB — ECHO COMPLETE
Aortic Arch Diameter: 2.8 cm
Aortic Diameter (mid tubular): 3.7 cm
Aortic Diameter (sinus of Valsalva): 2.8 cm
BMI: 42.4 kg/m2
BP Diastolic: 66 mmHg
BP Systolic: 114 mmHg
BSA: 2.35 m2
Echo RV Stroke Work Index Estimate: 379.7 mmHg•mL/m2
Heart Rate: 124 {beats}/min
Height: 66 in
IVC Diameter: 1.8 cm
LA Systolic Diameter: 4.3 cm
LA Systolic Vol BSA Index: 39.6 mL/m2
LA Systolic Vol Height Index: 55.5 mL/m
LA Systolic Volume: 93 mL
LV ASE Mass BSA Index: 77.3 gm/m2
LV ASE Mass Height 2.7 Index: 45 gm/m2.7
LV ASE Mass Height Index: 108.4 gm/m
LV ASE Mass: 181.6 gm
LV CO BSA Index: 2.05 L/min/m2
LV Cardiac Output: 4.81 L/min
LV Diastolic Volume Index: 38.2 mL/m2
LV Posterior Wall Thickness: 1.01 cm
LV SV - LVOT SV Diff: -1.34 mL
LV SV - LVOT SV Diff: -3.46 %
LV SV BSA Index: 16.5 mL/m2
LV SV Height Index: 23.1 mL/m
LV Septal Thickness: 1.14 cm
LV Stroke Volume: 38.8 mL
LV Systolic Volume Index: 21.7 mL/m2
LV wall/cavity ratio: 0.46
LVED Diameter BSA Index: 2 cm/m2
LVED Diameter Height Index: 2.8 cm/m
LVED Diameter: 4.7 cm
LVED Volume BSA Index: 38 ml/m2
LVED Volume BSA Index: 38.2 mL/m2
LVED Volume Height Index: 53.5 mL/m
LVED Volume: 89.7 mL
LVEF (Volume): 43 %
LVES Volume BSA Index: 21.7 mL/m2
LVES Volume BSA Index: 22 ml/m2
LVES Volume Height Index: 30.4 mL/m
LVES Volume: 50.9 mL
LVOT Area (calculated): 3.7 cm2
LVOT Cardiac Index: 2.12 L/min/m2
LVOT Cardiac Output: 4.98 L/min
LVOT Diameter: 2.17 cm
LVOT PWD VTI: 10.86 cm
LVOT PWD Velocity (mean): 52.1 cm/s
LVOT PWD Velocity (peak): 81 cm/s
LVOT SV BSA Index: 17.08 mL/m2
LVOT SV Height Index: 23.9 mL/m
LVOT Stroke Rate (mean): 192.6 mL/s
LVOT Stroke Rate (peak): 299.4 mL/s
LVOT Stroke Volume: 40.14 cc
MR Regurgitant Fraction (LV SV Mtd): -0.03
MR Regurgitant Volume (LV SV Mtd): -1.3 mL
Peak Gradient - TR: 23 mmHg
Peak Velocity - TR: 223.66 cm/s
Pulmonary Vascular Resistance Estimate: 4.8 mmHg
RA Pressure Estimate: 3 mmHg
RA Volume BSA Index: 20.9 mL/m2
RA Volume Height Index: 29.2 mL/m
RA Volume: 49 mL
RR Interval: 483.87 ms
RV Peak Systolic Pressure: 26 mmHg
RVED Diameter BSA Index: 1.7 cm/m2
RVED Diameter Height Index: 2.4 cm/m
RVED Diameter: 4 cm
SEM (LVOT Mean) mN-s: 21.97 mN-s
SEM (LVOT peak) mN-s: 34.16 mN-s
Weight (lbs): 262 [lb_av]
Weight: 4192 oz

## 2022-07-27 MED ORDER — PERFLUTREN PROTEIN A MICROSPH (OPTISON) IV SUSP *I*
1.5000 mL | INTRAVENOUS | Status: DC | PRN
Start: 2022-07-27 — End: 2022-07-28
  Administered 2022-07-27: 1.5 mL via INTRAVENOUS

## 2022-07-31 ENCOUNTER — Telehealth: Payer: Self-pay | Admitting: Cardiology

## 2022-07-31 MED ORDER — METOPROLOL SUCCINATE 100 MG PO TB24 *I*
200.0000 mg | ORAL_TABLET | Freq: Every day | ORAL | 0 refills | Status: DC
Start: 2022-07-31 — End: 2022-08-08

## 2022-07-31 NOTE — Telephone Encounter (Signed)
Medication Question:    Who is calling: Carleene     What medication:metoprolol    What dose:unknown     What question or concerns: pt  had an echo and was prescribed this medication but is already on it

## 2022-07-31 NOTE — Telephone Encounter (Signed)
Spoke to patient who will increase her metoprolol to 100 mg twice daily.  She will double check her bottles when she gets home.    A new Rx was sent for 100 mg tabs to be taken twice daily until her visit 8/6.   Total daily dose 200 mg daily                    JDeans RN

## 2022-07-31 NOTE — Telephone Encounter (Signed)
Alison Stalling, MD  Tinoglio, [name]; Amb Card Ep Rn8 minutes ago (9:21 AM)     PP  [name].  Please add her on 8/6 so I can review options with her.    Nursing, change her to toprol XL 200mg  daily until then.

## 2022-07-31 NOTE — Telephone Encounter (Signed)
L/VMM for patient that she has been scheduled 08/14/22 @ 9:30 with Dr. Allena Katz

## 2022-08-01 ENCOUNTER — Other Ambulatory Visit: Payer: Self-pay | Admitting: Internal Medicine

## 2022-08-02 ENCOUNTER — Other Ambulatory Visit: Payer: Self-pay

## 2022-08-02 ENCOUNTER — Ambulatory Visit: Payer: Medicare Other | Attending: Hematology | Admitting: Hematology

## 2022-08-02 VITALS — BP 120/87 | HR 116 | Temp 96.6°F | Resp 16 | Wt 267.7 lb

## 2022-08-02 DIAGNOSIS — D471 Chronic myeloproliferative disease: Secondary | ICD-10-CM | POA: Insufficient documentation

## 2022-08-02 NOTE — Progress Notes (Signed)
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 This presented as some left breast pain after her dog bruised her.     Chemotherapy   Began cycle 1 of weekly paclitaxel and carboplatin on July 15, 2020.  Immunotherapy with pembrolizumab was not recommended due to her previous allogenic bone marrow transplant.  Final cycle (4) of paclitaxel and carboplatin was administered on September 30, 2020.  Began dose dense cyclophosphamide and doxorubicin on October 06, 2020  Had surgery radiation as well.        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  This is severely limiting her mobility.  She is having the hip replaced next month. She now has neuropathy that comes up to ankles as well. The toes especially are very painful. No medication has helped to date,and she even tried accupuncture.   She is having diffuse sweats.  GVHD symptomatology:  no new rash, eye or mouth dryness. Breathing is stable.  No change in bowel or bladder function.  Thyroid function has been normal.   She is depressed because she can only sit around all day    She was diagnosed with a-fib.     []  fevers  []  palpitations []  urinary frequency   []  chills []  chest pain []  dysuria    []  dry mouth []  nausea/emesis  []  rash   []  ear pain []  abdominal cramping []  skin changes   []  dry mouth []  abdominal discomfort []  pain   []  sore throat []  loose stool, non-formed [x]  fatigue   []  cough  []  diarrhea, watery []  enlarged lymph nodes   []  sputum production  []  weight loss [x]  Joint pains   []  shortness of breath []  insufficient oral intake []  none of the above     Medications  Reviewed medications with patient today &  electronically reconciled  Current Outpatient Medications on File Prior to Visit   Medication Sig Dispense Refill    DULoxetine (CYMBALTA) 60 mg DR capsule TAKE 1 CAPSULE(60 MG) BY MOUTH DAILY FOR MAJOR DEPRESSION (Patient not taking: Reported on 08/02/2022) 90 capsule 5    ondansetron (ZOFRAN) 4 mg tablet TAKE 1 TABLET(4 MG) BY MOUTH THREE TIMES DAILY AS NEEDED 30 tablet 0    apixaban (ELIQUIS) 5 mg tablet Take 1 tablet (5 mg total) by mouth every 12 hours. 180 tablet 3    tirzepatide (MOUNJARO) 2.5 mg/0.65mL pen Inject 2.5 mg into the skin every 7 days. 2 mL 4    DULoxetine (CYMBALTA) 30 mg DR capsule Alternate every other day with the 60mg  to wean off and after 3 weeks go to one a day fr 3 weeks and then go to 30mg  alternating with nothing for three weeks and then stop. 90 capsule 0    levothyroxine (SYNTHROID, LEVOTHROID) 50 mcg tablet TAKE 1 TABLET BY MOUTH EVERY DAY 90 tablet 5    metoprolol tartrate (LOPRESSOR) 50 mg tablet TAKE 1 TABLET(50 MG) BY MOUTH TWICE DAILY (Patient taking differently: Take 1.5 tablets (75 mg total) by mouth 2 times daily.) 180 tablet 5    acyclovir (ZOVIRAX) 400 mg tablet TAKE 1  TABLET(400 MG) BY MOUTH TWICE DAILY 180 tablet 3    aspirin 81 mg EC tablet Take 1 tablet (81 mg total) by mouth daily  Please do not continue taking this medication until after you finish your 6 weeks of eliquis for DVT prophylaxis.      atorvastatin (LIPITOR) 40 mg tablet TAKE 1 TABLET BY MOUTH IN THE EVENING 90 tablet 5    metoprolol succinate ER (TOPROL-XL) 100 mg 24 hr tablet Take 2 tablets (200 mg total) by mouth daily. Do not crush or chew. May be divided. (Patient not taking: Reported on 08/02/2022) 60 tablet 0    escitalopram (LEXAPRO) 5 mg tablet Take 1 tablet (5 mg total) by mouth daily. Weaning off Cymbalta and going back on lexapro. (Patient not taking: Reported on 08/02/2022)      aluminum chloride (DRYSOL) 20 % external solution Apply topically nightly. to the following areas: axilla and wash off  then next am (Patient not taking: Reported on 06/11/2022) 37.5 mL 1    nystatin (MYCOSTATIN) 100000 UNIT/GM cream Apply topically 2 times daily  to the following areas: apply to the rash under the right breast (Patient taking differently: Apply topically 2 times daily for rash. to the following areas: apply thin layer to the rash under the right breast prn) 30 g 1     No current facility-administered medications on file prior to visit.     VS, weight, KPS  BP 120/87 (BP Location: Left arm, Patient Position: Sitting, Cuff Size: large adult)   Pulse (!) 116   Temp 35.9 C (96.6 F) (Temporal)   Resp 16   Wt 121.5 kg (267 lb 12 oz)   SpO2 97%   BMI 43.22 kg/m     Wt Readings from Last 3 Encounters:   08/02/22 121.5 kg (267 lb 12 oz)   07/27/22 118.8 kg (262 lb)   06/20/22 119.2 kg (262 lb 12.8 oz)       KPS%: 80    Physical Exam   General Appearance:    NAD, Still walks with a limp   Head:  Normocephalic, atraumatic; now alopecic   Eyes:  Conjunctivae/corneas clear, no eye drainage   Oral/Throat: MMM without lesions, irritation or exudate   Neck: Supple, normal turgor; no adenopathy.    Heart: S1, S2 normal, regular rhythm, 2/6 systolic ejection murmur; no appreciable JVD   Lungs:   Respirations unlabored, CTAB without wheezes, rales or rhonchi   Abdomen:   Soft, non-tender, bowel sounds active all four quadrants, no masses, no organomegaly   Extremities: No edema   Pulses: Not checked.    Skin: No rash     No results found for this or any previous visit (from the past 24 hour(s)).        Lab results: 06/05/22  1214   Sodium 138   Potassium 5.2*   Chloride 98   CO2 26   UN 19   Creatinine 0.81   Glucose 175*   Calcium 10.3*   Total Protein 7.1   Albumin 4.6   ALT 30   AST 31   Alk Phos 141*   Bilirubin,Total 0.5     Lab Results   Component Value Date/Time    WBC 6.1 06/05/2022 12:14 PM    RBC 4.2 06/05/2022 12:14 PM    HGB 12.5 06/05/2022 12:14 PM    HCT 40 06/05/2022 12:14 PM    MCV 94 06/05/2022 12:14 PM    RDW  16.2 (H) 06/05/2022 12:14  PM    PLT 266 06/05/2022 12:14 PM    SEGR 58.9 06/05/2022 12:14 PM    LYMPR 30.3 10/12/2021 08:50 AM    BAND 3 11/24/2020 09:41 AM    MONOR 9.0 10/12/2021 08:50 AM    EOSR 3.2 10/12/2021 08:50 AM    BASOR 0.7 10/12/2021 08:50 AM    ASEGR 3.6 06/05/2022 12:14 PM    ALYMR 1.8 06/05/2022 12:14 PM    AMONR 0.5 06/05/2022 12:14 PM    AEOSR 0.2 06/05/2022 12:14 PM    ABASR 0.0 06/05/2022 12:14 PM     Assessment and Plan  Carly Higgins is a 62 y.o. female with unclassifiable myeloproliferative neoplasm and completed Decitabine 20mg /m2 IV daily x 5 days - Cycle # 1 12/15/14 and had been on Hydrea prior to transplant. 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 .    Atypical MPN S/P URD allo, now at 5. 5 years  Day 30 marrow (6/6) hypercellular marrow, no evidence of malignancy, chimerism 2% recipient  Day 100 BMbx (09/15/15) Normochromic normocytic anemia. Mild lymphopenia. Mildly hypercellular bone marrow with trilineage hematopoiesis. Chimerism 2% recipient.  1 year marrow with normocellularity and 100% donor chimerism         GVHD  Full dose MTX given day 1,3,6. 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     Breast cancer diagnosed in summer of 2022--has now completed chemotherapy       ID  Hx Cholecystitis, pneumonia 05/2015  CMV PCR not required;  both negative   Will continue acyclovir for now.     Completed vaccines.   Flu shot and COVID vaccine recommended this fall.     CV/Pulm  Hx A-flutter/a-fib  Cont Metoprolol 50 mg BID; on eliquis  Followed by  Cardiology      Acute KI on CKD r/t tacrolimus  Cr is now normal.       GI  Cont protonix;  Marland Kitchen   Zofran prn        MSK  Joint replacement planned  Smoking cessation   Has been able to abstain thus far      Depression--somewhat situational    Derm  Rash overall c/w tinea vesicolor in  past    Neuropathy--will refer to neruo-onc to see if they have therapeutic ideas; referral placed.     Sweats--? Duloxetine or Mounjaro; will check side effect profiles of these.   TSH normal.     Follow up  BMT clinic:1 year now that she is > 5 years post-SCT.   Bloodwork frequency: labs prior      Almedia Balls, MD

## 2022-08-03 ENCOUNTER — Ambulatory Visit: Payer: Self-pay

## 2022-08-03 NOTE — Progress Notes (Signed)
ANTICOAGULATION PLAN APPROVED BY PRESCRIBING PHYSICIAN     ANTICOAGULATION PLAN FOR SURGERY    DATE OF SURGERY:  8/23    SURGEON: Dr. Arlyce Dice    ANTICOAGULANT: Everlene Balls (APIXABAN), Aspirin 81 mg    HOLD TIME AND PLAN: Hold 3 days prior to surgery; Continue taking 81 mg aspirin daily    MANAGING/APPROVING PHYSICIAN: Dr Allena Katz & Dr Maggie Font via [facility] 7/26; Approved by Dr Allena Katz 7/26

## 2022-08-04 NOTE — Result Encounter Note (Signed)
Seen and pt aare   Card 8/6  [facility]  Bryson Palen Bonne Dolores, MD

## 2022-08-06 NOTE — Discharge Instructions (Signed)
Pre-Operative Instructions - Total Joint Replacement                FOLLOW YOUR SURGEON'S INSTRUCTIONS IF DIFFERENT THAN BELOW.          When to Arrive for Surgery         On the day prior to your surgery you will find out your arrival time. Staff from the [facility] will call you between 1:30 PM and 4 PM on the day before surgery to inform you of your arrival and surgery times.         Note: Patients scheduled for a procedure on Monday will be assigned an arrival time on the Friday before. Please note surgery start time is approximate. You may want to bring something to help pass the time.        PLEASE ARRIVE ON TIME.        Directions to Surgical Center        [facility]: On the day of your procedure, park in the parking garage and enter through the Main Lobby.  Stop at the Information Desk in the lobby for directions to the Surgery Center on [address].        Eating Guidelines        Follow the instructions below unless otherwise instructed by your physician.        No solid food AFTER MIDNIGHT on the day of your surgery. No candy, gum, mints or chewing tobacco.        You can have clear liquids up to 2 hours before your surgery. This includes only Gatorade, clear apple juice, and water.        Failure to follow these instructions, could lead to a delay or cancellation of your procedure.        Medication Guidelines        On the morning of surgery, take only the medications indicated on the Preoperative Medication List above.        Medications should only be taken with no more than one ounce of water.        Stop taking any non-steroidal anti-inflammatory agents (such as Ibuprofen, Advil, Motrin, Aleve, or Naproxen) for five days prior to surgery (if directed differently in medication chart on previous page please follow those instructions).        Please leave your prescriptions at home with the exception of your inhalers.        Additional Information        THREE NIGHTS BEFORE  SURGERY FOR INFECTION CONTROL  Put clean bedsheets on the first evening only, you do not need to change every night.  We ask you to shower for 3 evenings before your surgery using the 4% Chlorhexidine scrubs the nurse has given you.  Each night take a shower using antibacterial soap (such as Dial) and your normal shampoo/conditioner.  Afterward, while still in the shower, use 4% Chlorhexidine scrub and wash your body from the neck down. DO NOT wash your head, face, eyes and ears with the Chlorhexidine soap. This soap does not lather. Let the soap sit on your skin for 2 minutes. Rinse thoroughly.   Dry off with a clean, fresh towel each evening.  Do not apply lotion after your showers. Deodorant is okay to use. Do not shave below your waist for one week prior to surgery.  After showering, dress in freshly laundered clothes each evening.   If you experience itching or redness on application,   wash it off and do not use it again. Continue with antibacterial soap (such as Dial).  If showering on the morning of surgery follow the same instructions as above: use antibacterial soap followed by Chlorhexidine scrub.       What to bring or wear:        We ask that your cell phone be with you and turned on, so that the Preop and Operating Room nurses may phone you directly with instructions on the morning of surgery if necessary.        Bring Photo ID and insurance information.        Eye glasses and/or hearing aids:  These may be removed prior to surgery so be prepared to leave them with a trusted family member.        Do NOT wear contact lenses.        Wear comfortable, loose fit clothing.          You must arrange a ride home before coming to surgery.        What NOT to bring or wear:        Before coming to the hospital, remove all makeup (including mascara), jewelry (including wedding band and watch), hair accessories and nail polish from toes and fingers.  Do not bring any valuables (money, wallet, purse, jewelry, or  contact lenses.)        Information for After Surgery:      Visiting Hours  Visiting hours are between 8 a.m. and 8 p.m.      Visitation for Patients Admitted to the Hospital    Two visitors, ages 12 or older, are allowed at the bedside at a time on inpatient units. Visitors under the age of 18 must be accompanied by an adult with legal identification.    Exceptions remain in place; visitors should consult the hospital's updated web site before visiting.    Guidelines for Patients Having Surgery   Only 1 visitor may accompany the patient during the pre-surgical process.   The visitors can remain socially distanced in the surgical waiting room.   If the patient is admitted, visitation/exception guidelines will be followed.    For more information regarding the [facility] Visitation Policy, please visit the following website:   [email or web address]          Your family will be notified when your surgery is completed and you have arrived on the patient care unit.        [facility]:        As a convenience, prior to your discharge we will fill any discharge medications you require.      The pharmacy is open from 9am to 5:30pm on weekdays and 10am-2pm on Saturdays.     If you will be alone on the day of surgery and want to leave with your prescribed medication, you may:  Bring a check made out to [facility]  Use a credit card to pay for your prescription  Have your family call the pharmacy with a credit card number  Only bring cash to the hospital as a last resort. Prescriptions cannot be filled without payment. Thank you for your consideration.            Questions:    Please call the [facility] at [phone] between 8:00 a.m. and 4:00 p.m. Monday through Friday.     Any questions regarding specifics about your surgery or recovery? Please call your surgeon's   office.            Surgical Site  Infections FAQs        What is a Surgical Site infection (SSI)?  A surgical site infection is an infection that occurs after surgery In the part of the body where the surgery took place. Most patients who have surgery do not develop an infection. However, Infections develop in about 1 to 3 out of every 100 patients who have surgery.         Some of the common symptoms of a surgical site infection are:    Redness and pain around the area where you had surgery    Drainage of cloudy fluid from your surgical wound    Fever         Can SSIs be treated?   Yes. Most surgical site infections can be treated with antibiotics. The antibiotic given to you depends on the bacteria (germs) causing the Infection. Sometimes patients with SSIs also need another surgery to treat the infection.         What are some of the things that hospitals are doing to prevent SSls?   To prevent SSIs, doctors, nurses, and other healthcare providers:    Clean their hands and arms up to their elbows with an antiseptic agent just before the surgery.    Clean their hands with soap and water or an alcohol-based hand rub before and after caring for each patient.    May remove some of your hair Immediately before your surgery using electric clippers If the hair Is in the same area where the procedure will occur. They should not shave you with a razor.    Wear special hair covers, masks, gowns, and gloves during surgery to keep the surgery area clean.    Give you antibiotics before your surgery starts. in most cases, you should get antibiotics within 60 mInutes before the surgery starts and the antibiotics should be stopped within 24 hours after surgery.    Clean the skin at the site of your surgery with a special soap that kills germs.         What can I do to help prevent SSIs?   Before your surgery:    Tell your doctor about other medical problems you may have. Health problems such as allergies, diabetes, and obesity could affect your surgery and your  treatment.    Quit smoking. Patients who smoke get more Infections. Talk to your doctor about how you can quit before your surgery.    Do not shave near where you will have surgery. Shaving with a razor can Irritate your skin and make it easier to develop an infection.         At the time of your surgery:    Speak up if someone tries to shave you with a razor before surgery. Ask why you need to be shaved and talk with your surgeon if you have any concerns.    Ask if you will get antibiotics before surgery.         After your surgery:    Make sure that your healthcare providers clean their hands before examining you, either with soap and water or an alcohol-based hand rub.   If you do not see your healthcare providers wash their hands,   please ask them to do so.     Family and friends who visit you should not touch the surgical wound or dressings.    Family and friends   should clean their hands with soap and water or an alcohol-based hand rub before and after visiting you. If you do not see them clean their hands, ask them to clean their hands.   What do I need to do when I go home from the hospital?    Before you go home, your doctor or nurse should explain everyt hing you need to know about taking care of your wound. Make sure you understand how to care for your wound before you leave the hospital.    Always clean your hands before and after caring for your wound.    Before you go home, make sure you know who to contact If you have questions or problems after you get home.    If you have any symptoms of an Infection, such as redness and pain at the surgery site, drainage, or fever, call your doctor immediately.   if you have additional questions, Please ask your doctor or nurse.

## 2022-08-07 ENCOUNTER — Ambulatory Visit: Payer: Medicare Other | Admitting: Cardiology

## 2022-08-08 ENCOUNTER — Other Ambulatory Visit
Admission: RE | Admit: 2022-08-08 | Discharge: 2022-08-08 | Disposition: A | Discharge status: Home / Self Care | Payer: Medicare Other | Source: Ambulatory Visit | Attending: Orthopedic Surgery | Admitting: Orthopedic Surgery

## 2022-08-08 ENCOUNTER — Ambulatory Visit
Admission: RE | Admit: 2022-08-08 | Discharge: 2022-08-08 | Disposition: A | Discharge status: Home / Self Care | Payer: Medicare Other | Source: Ambulatory Visit | Attending: Orthopedic Surgery | Admitting: Orthopedic Surgery

## 2022-08-08 ENCOUNTER — Other Ambulatory Visit: Payer: Self-pay

## 2022-08-08 ENCOUNTER — Ambulatory Visit
Admission: RE | Admit: 2022-08-08 | Discharge: 2022-08-08 | Disposition: A | Discharge status: Home / Self Care | Payer: Medicare Other | Source: Ambulatory Visit

## 2022-08-08 VITALS — BP 106/73 | HR 95 | Temp 97.0°F | Resp 16 | Ht 66.0 in | Wt 267.4 lb

## 2022-08-08 DIAGNOSIS — Z01818 Encounter for other preprocedural examination: Secondary | ICD-10-CM | POA: Insufficient documentation

## 2022-08-08 DIAGNOSIS — M1611 Unilateral primary osteoarthritis, right hip: Secondary | ICD-10-CM

## 2022-08-08 LAB — TYPE AND SCREEN
ABO RH Blood Type: A POS
Antibody Screen: NEGATIVE

## 2022-08-08 LAB — COMPREHENSIVE METABOLIC PANEL
ALT: 29 U/L (ref 0–35)
AST: 26 U/L (ref 0–35)
Albumin: 4.5 g/dL (ref 3.5–5.2)
Alk Phos: 139 U/L — ABNORMAL HIGH (ref 35–105)
Anion Gap: 12 (ref 7–16)
Bilirubin,Total: 0.6 mg/dL (ref 0.0–1.2)
CO2: 25 mmol/L (ref 20–28)
Calcium: 10 mg/dL (ref 8.6–10.2)
Chloride: 102 mmol/L (ref 96–108)
Creatinine: 0.91 mg/dL (ref 0.51–0.95)
Glucose: 104 mg/dL — ABNORMAL HIGH (ref 60–99)
Lab: 15 mg/dL (ref 6–20)
Potassium: 5.3 mmol/L — ABNORMAL HIGH (ref 3.3–5.1)
Sodium: 139 mmol/L (ref 133–145)
Total Protein: 6.9 g/dL (ref 6.3–7.7)
eGFR BY CREAT: 71 *

## 2022-08-08 LAB — POCT HEMOCUE HEMOGLOBIN: POCT Hemocue Hemoglobin: 12.8 g/dL (ref 11.2–16.0)

## 2022-08-08 LAB — MSSA/MRSA NAAT
MRSA nasal NAAT (PCR): POSITIVE — AB
MSSA Nasal NAAT (PCR): POSITIVE — AB

## 2022-08-08 LAB — APTT: aPTT: 32.9 s (ref 25.8–37.9)

## 2022-08-08 LAB — PROTIME-INR
INR: 1.2 — ABNORMAL HIGH (ref 0.9–1.1)
Protime: 13.5 s — ABNORMAL HIGH (ref 10.0–12.9)

## 2022-08-08 LAB — CBC
Hematocrit: 42 % (ref 34–49)
Hemoglobin: 13.4 g/dL (ref 11.2–16.0)
MCV: 94 fL (ref 75–100)
Platelets: 318 10*3/uL (ref 150–450)
RBC: 4.5 MIL/uL (ref 4.0–5.5)
RDW: 14.9 % (ref 0.0–15.0)
WBC: 6.6 10*3/uL (ref 3.5–11.0)

## 2022-08-08 LAB — HEMOGLOBIN A1C: Hemoglobin A1C: 6.6 % — ABNORMAL HIGH

## 2022-08-08 NOTE — Anesthesia Preprocedure Evaluation (Addendum)
Anesthesia Pre-operative History and Physical for Carly Higgins  History and Physical Performed at [facility]  Highlighted Issues for this Procedure:  62 y.o. female with Primary osteoarthritis of right hip [M16.11] presenting for Procedure(s) (LRB):  RIGHT POSTERIOR ARTHROPLASTY, HIP, TOTAL (Right) by Surgeon(s):  Fransisca Connors, MD  Birdena Crandall, MD scheduled for 140 minutes.  BMI Readings from Last 1 Encounters:  12/21/22 : 41.97 kg/m          Stress Test/Echocardiography:  07/27/22 Echo Complete with Contrast    Patient is in atrial fibrillation with a fast ventricular rate of 110-130.    There is concentric hypertrophy. The left ventricular ejection fraction is mildly decreased. The calculated LVEF is 43% globally reduced.    Left atrial enlargement.    There is mild-to-moderate mitral regurgitation.    There is mild tricuspid regurgitation.    The size of the ascending aorta is borderline enlarged. Aortic Diameters: sinus = 2.8 cm, tubular = 3.7 cm, arch = 2.8 cm.    Compared to prior echocardiogram her overall LV function is more globally reduced to 43% and was 65%.  There is noted mitral regurgitation as well as tricuspid agitation.  Patient is in a fast atrial fibrillation.    SYSTOLIC FUNCTION: The left ventricular ejection fraction is mildly decreased (40-49%). The calculated LVEF is 43%.  LV volumes were measured in the following views: apical 4C and apical 2C. LV volumes were measured with the aid of echo contrast. Stroke volume is mildly reduced (LV SV = 38.8 mL and LVOT SV = 40.1 mL). Cardiac output is normal (CO = 4.8 LPM, LVOT CO = 5 LPM). Left ventricular wall motion is abnormal with global wall motion abnormalities.    Right Ventricle  The size of the right ventricular cavity is mildly enlarged. RV ejection fraction is normal.   RV MEASUREMENTS: RVED diameter = 4.0 cm, Stroke Work Index Estimate = 379.7 mmHgmL/m2,    Right Atrium  The size of the right atrial cavity is within normal  limits (RA volume = 49 mL). The size of the inferior vena cava is normal (IVC Diameter = 1.8 cm). The size of the IVC decreases with inspiration (normal response).     RA Volume BSA index = 20.9 mL/m2, RA Volume height index = 29.2 mL/m      Electrophysiology/AICD/Pacer:  EKG 11/14/22: Atrial fibrillation, HR 96, QTc 469  Borderline  low voltage, extremity leads       EPATCH 14-DAY MONITOR 07/19/2022  Patient monitored for 14d, analyzable time was 11d 6h starting on 06/20/2022 04:00 pm.  Primary rhythm was Atrial Fibrillation / Flutter. Average heart rate was 99 bpm, Minimum heart rate was 59 bpm on Day 13  / 08:22:54 am, Max heart rate was 196 bpm on Day 2 / 07:26:20 pm  Atrial Fibrillation or Flutter: Burden was 54.95 %, longest event 3d 23h on Day 1 / 04:05:20 pm, fastest event 196 bpm on  Day 1 / 04:05:20 pm  SVE(s): Burden was 0.59 %, 9375 total SVE(s)  SV Arrhythmia(s): 46 event(s), longest event 23 beats on Day 6 / 12:42:21 am, fastest event 141 bpm on Day 8 / 10:31:51  am  PVC(s): Burden was 0.03 %, 430 total PVC(s), 3 disparate morphologies  Ventricular Arrhythmia(s): 1 event(s), longest event 4 beats at Day 15 / 10:25:52 am, fastest event 185 bpm at Day 15 /  10:25:52 am  Patient recorded 4 event(s) during the monitoring period  Cardioversion not required as patient  paroxysmal.      Cardiac /Vascular Catheterization:   CARDIAC CATHETERIZATION 09/24/2022  S/p successful cardioversion. Pt was shocked with 200 J and 360 J x 2 with conversion of NSR for a few second with early return of atrial fibrillation.       Pulmonary Function Tests:  03/28/16 PFTs: FEV1 2.17, FVC 2.78, FEV1/FVC 78%, DLCO 15.91  Impression: no significant change compared to prior testing. Isolated reduction in the diffusing capacity, consistent with pulmonary vascular disease (including pulmonary edema) or interstitial lung disease (including emphysema). Clinical correlation is suggested. The reduced FEV1 and FVC and normal FEV1/FVC suggest  a restrictive ventilatory deficit. However, this is not confirmed by lung volume measurement because TLC is normal. Serial tests may reveal the presence of an ongoing restrictive process.       .  [facility] Summary:  Carly Higgins presents preoperatively for anesthesia evaluation prior to RIGHT POSTERIOR ARTHROPLASTY, HIP, TOTAL (Right: Hip) on 12/21/22 with Dr Arlyce Dice.     She has a Past Medical History:  Primary osteoarthritis of right hip   BMI: 42.5, on tirzepatide  - OSA: High suspicion for sleep apnea, pt has appointment for sleep study in June 2025.   - Paroxysmal atrial fibrillation and flutter: cardioverted on 09/24/22 but had early return of atrial fibrillation. Was scheduled for AF ablation on 11/29/22, but was cancelled as wants to have her hip surgery in Dec. Rate controlled on metoprolol, also on apixaban. LOV Cardiology 11/14/22, pt "understands she is at moderate risk for a cardiac event during surgery but at this point it is not prohibitive as she would like to have this done first.  She would consider catheter ablation for atrial fibrillation earlier next year." Per Dr. Allena Katz, " Rate control rate control if needed can be done with intravenous Cardizem or metoprolol during her procedure."  - HLD: atorvastatin  - Hyperkalemia: K 5.2 on labs 11/22/22. Pt reports that she had a banana and potato prior to blood work. Discussed low potassium diet. Pt reports understanding.   - Hypothyroidism: levothyroxine, TSH: 19.60 on 11/13/22. Patient states she has been taking it every morning now. Will order T4  - DM Type 2: tirzepatide weekly, A1c: 6.6 on 11/13/22  - Stage IIB left breast cancer s/p partial mastectomy 01/24/21, completed chemo in 12/2020 and completed radiation 05/2021: LOV Oncology 11/19/22  - Myeloproliferative disease s/p bone marrow transplant 2017  - Chemo induced peripheral neuropathy  - Anxiety, Depression: lexapro   - Tobacco use: Quit few days ago, pt is motivated upon questioning for cessation.  Not on any patches or gum.    Patient is able to lie down flat w/o SOB and has DASI score of 18.95 points, 5.07 METS    The patient's allergies, medications, past surgical, family and social history were reviewed and verified. All updates were documented in the patient's medical record.    ADDENDUM 11/27/22: Labs reviewed, TSH 18.60, Free T4 1.1, labs sent to PCP for further management and surgeon for awareness       Anesthesia H/o:  09/24/22 Cardioversion: MAC  10/20/21 LEFT POSTERIOR ARTHROPLASTY, HIP, TOTAL (Left: Hip): ETT 7.0, MAC 3, direct laryngoscopy, grade I - full view of glottis     The student, Patricia Pesa, PA student was personally supervised by me during the patient examination. I personally saw and evaluated the patient, provided the medical decision-making, and reviewed and verified the key elements of the student documentation. I have edited the student's note and confirm the  findings and plan of care as documented.    Perioperative Cardiac Risk:  2.2-6.6%    This patient, upon review and physical exam, is deemed most appropriate to have their surgical procedure performed at a setting where admission is possible. By Suzzette Righter, NP at       Anesthesia Evaluation Information Source: records, patient     ANESTHESIA HISTORY     Denies anesthesia history  Pertinent(-):  No History of anesthetic complications or Family hx of anesthetic complications    GENERAL  Pertinent (-):  No Anesthesia monitoring restrictions, infection, history of anesthetic complications or Family Hx of Anesthetic Complications    HEENT    + Visual Impairment          corrective lens for ADL  Pertinent (-):  No glaucoma, hearing loss, TMJD, sinus issues, nosebleeds or neck pain PULMONARY    + Smoker          tobacco quit recently    + Snoring          daytime tiredness    + Sleep apnea (Plan for sleep study)          high suspicion  Pertinent(-):  No asthma, shortness of breath, recent URI or COPD    CARDIOVASCULAR  Good(4+METs)  Exercise Tolerance    + Cardiac Testing          more details above, echo, 43% ejection fraction    + Anticoagulants          apixaban    + Hx of Dysrhythmias (Planning for ablation in future)          atrial fib, cardioverted    + CHF          HFrEF  Pertinent(-):  No past MI, angina, CABG, orthopnea or hx of DVT    Comment: Limited d/t pain to hip and neuropathy to feet. She does housework and Pharmacologist. She uses a scooter to walk the dog. DASI score: 18.95 points, 5.07 METS    GI/HEPATIC/RENAL   NPO: > 8hrs ago (solids) and > 2hrs ago (clears)      + Nausea (Endorses from [place] usage)    + Vomiting (Endorses from [place] usage)  Pertinent(-):  No GERD, alcohol use, liver  issues, bowel issues, renal issues, urinary issues or  esophageal issues  NEURO/PSYCH/ORTHO    + Chronic pain          lower back    + Neuropsychiatric Issues          depression    + Gait/Mobility issues (scooter when walking dogs, usually uses walker to ambulate)          cane, walker  Pertinent(-):  No headaches, dizziness/motion sickness, syncope, seizures, cerebrovascular event or neuromuscular disease    ENDO/OTHER    + Diabetes Mellitus          Type 2 no insulin HgA1c: 6.6    + Thyroid Disease (Pt reports she takes Synthroid every morning)          HYPOthyroid     + Chemo Hx  Pertinent(-):  No hormone use, steroid use, menstruating (post-menopause)    HEMATOLOGIC    + Bruises/bleeds easily    + Anticoagulants/Antiplatelets          apixaban    + Blood Transfusion (Bone marrow transfusion of rmyeloproliferative dz, 2017. No reaction)    + Blood dyscrasia          hyperlipidemia    +  Arthritis          lumbar and hips  Pertinent(-):  No coagulopathy       Physical Exam    Airway            Mouth opening: normal            Mallampati: I            TM distance (fb): >3 FB            Neck ROM: full  Dental        Comment: Patient reports 2 missing teeth. Denies any loose or chipped teeth   Cardiovascular           Rhythm: irregular            Rate: normal  No peripheral edema or murmur        General Survey    No rashes, wounds   Pulmonary   Normal Exam    breath sounds clear to auscultation    No cough, rhonchi, stridor, wheezes, rales    Mental Status   Normal Exam    oriented to person, place and time     Patient Education from [facility] Provider:  Patient Education:  The following items were discussed with Carly Higgins to her satisfaction and comprehension:  The facility's NPO guidelines were discussed  To call the surgeon if she becomes ill prior to surgery  All questions were answered  Medications DOS with sip of water: see AVS  Hold medications AM day of surgery: see AVS  Nurse reviewed additional items as indicated in the education record.  Carly Higgins verbalized knowledge and teaching objectives met.  No barriers to learning identified.  Teaching sheet reviewed with patient/family.  IV insertion was reviewed with her.  The [facility] valuable policy was discussed.  The patient was instructed not to wear jewelry.  Carly Higgins was instructed to hold NSAIDS 5 days before surgery.  Chlohexidine scrub instructions reviewed.      ________________________________________________________________________  PLAN  ASA Score  3  Anesthetic Plan general      Protocols/Care Pathways (Total Joint Arthroplasty) General Anesthesia/Sedation Maintenance Plan (propofol infusion); Airway (mask); Line ( use current access); Monitoring (standard ASA); Positioning (lateral decubitus); PONV Plan (dexamethasone and ondansetron); Pain (per surgical team, intraop local and PO Tylenol); PostOp (PACU)Standard Attestation  Informed Consent     Risks:          Risks discussed were commensurate with the plan listed above with the following specific points: N/V, sore throat, aspiration, dizziness, emergence delirium, fatigue, hypotension, infection, failed block, headache and unsteadiness, Damage to: nerves, teeth and eyes, allergic Rx, unexpected serious injury and awareness.     Anesthetic Consent:         Anesthetic plan (and risks as noted above) were discussed with patient    Blood products Consent:        Use of blood products discussed with: patient and they consented    Responsible Anesthesia Provider Attestation:  I attest that the patient or proxy understands and accepts the risks and benefits of the anesthesia plan. I also attest that I have personally performed a pre-anesthetic examination and evaluation, and prescribed the anesthetic plan for this particular location within 48 hours prior to the anesthetic as documented. Lillie Fragmin, MD  12/21/22, 7:17 AM

## 2022-08-08 NOTE — Progress Notes (Incomplete)
Medical Optimization Consult    Date of Service:  08/08/22   Patient's Name: Carly Higgins  Patient's Age:  62 y.o.    Primary Care Physician: Volanda Napoleon, MD    Requesting Physician: Arlyce Dice Procedure Right THA Surgery Date: 8/23       Intended purpose of this consult:    Optimization of medical co-morbidities  Assessment of the patient's risk of perioperative complications    Carly Higgins is a 62 y.o. patient who presents for preoperative evaluation of medical comorbid conditions and risk assessment in anticipation of the above procedure.  The patient has ***.      Current physical activity: ***   Requires assistive device for ambulation: ***  Functional Capacity: ***  (Examples of activities associated with > 4 METs are climbing a flight of stairs or walking up a hill, walking on level ground at 4 mph, and performing heavy work around the house.)      Lives alone: ***  Post operative support: ***  Concerns about returning home after discharge: ***  High risk of requiring inpatient level of care: ***    Pertinent medical conditions and/or prior cardiopulmonary testing reviewed during this visit:   ***  ***  ***  Prior perioperative or peri-anesthesia complications: ***         Obesity BMI = ***                     [] NA   Diabetes HbA1c = ***  Fructosamine =               [] NA   Anemia Hb = ***                         [] NA   Tobacco Abuse Disorder  ***                                  [] NA   Chronic Opioid Use  ***                                   [] NA   OSA    CPAP []          BIPAP []  [] NA   DVT/PE History  ***                                   [] NA       ASSESSMENT and RECOMMENDATIONS:     Carly Higgins is a 62 y.o. year old patient referred for preoperative evaluation and risk assessment before the above surgery for the above surgical indications.  Based on the clinical information obtained and reviewed during this visit, the overall assessment is that the patient is having *** risk surgery.     The  patient    []  IS    []  IS NOT  stable / optimized for surgery      Additional testing and/or specialist recommendations needed: []  YES   []  NO    Additional optimization needed: []  YES    []  NO    ASA Physical Status: {ASA PHYSICAL STATUS YQMVHQ:[id number]}    Perioperative Cardiac Risk Stratification:    2014 ACC/AHA Perioperative Cardiac Risk Stratification for non-emergent, non-cardiac surgery:  Are active cardiac conditions present? ***  Calculate the combined surgical and patient-specific risk: NSQIP ***  Cardiac Risk Estimation: per the Revised Cardiac Risk Index (Circ. 100:1043, 1999), the patient's risk factors for major adverse cardiovascular events (MACE) include {cardiac risk index:16694}, putting her in: {risk class:16695}    Perioperative Pulmonary Risk Stratification:   ARISCAT:   pulmonary complication rate   []  Low    STOP-BANG: []  NA []  Score:     Medication Management Recommendations:  A detailed discussion was held during the visit and instructions are in the AVS.      Patient is a Fast Pass candidate? [] Y    [] N     [] NA     Chlorhexidine scrubs given with instructions? [] Y    [] N   Joint booklet received? [] Y    [] N   Counseling about peripheral nerve block and or spinal anesthesia? [] Y    [] N     History of MRSA or other resistant organisms? [] Y    [] N        Anticoagulation plan addressed?  []  Aspirin 81mg   []  Aspirin > 81mg   []  Warfarin  []  Apixaban (Eliquis)  []  Rivaroxaban (Xarelto) []  Y   []  N    [] NA     Physical Exam:    Airway Exam: Mallampati *** , > 3FB, FROM Neck  Vital Signs: see flowsheet  Gen:  well appearing, NAD  HEENT: good mouth opening, acceptable dentition, *** missing/loose teeth, *** dentures  CVS:  ***       Lungs:  clear      Skin:  No wounds or rashes  Ext/Msk: No LE edema  Neuro:  Grossly intact     []  Reviewed documentation in [facility]'s EHR    I personally spent *** minutes on the calendar day of the encounter communicating with healthcare providers,  reviewing the patient's record, obtaining/reviewing outside records, performing a history and examination, counseling and education the patient/family/caregiver, ordering testing, documenting clinical information in the EMR, communicating test results to the patient/family/caregiver, and/or coordinating patient care.    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    Conditioned w/ Busulfan 130 mg/m2/d and Fludarabine 40 mg/m2/d x4 days followed by 10/10 HLA MUD (F) Allogeneic PBSCT (pt/donor: ABO: A+/A+, CMV N/N)    Pulmonary edema     s/p L THA 10/20/21 10/20/2021    Shortness of breath     Tobacco use        Past Surgical History:  Past Surgical History:   Procedure Laterality Date    BONE MARROW TRANSPLANT  2017    Mediport insertion      PR ARTHRP ACETBLR/PROX FEM PROSTC AGRFT/ALGRFT Left 10/20/2021    Procedure: LEFT POSTERIOR ARTHROPLASTY, HIP, TOTAL;  Surgeon: Fransisca Connors, MD;  Location: [facility] MAIN OR;  Service: Orthopedics    PR AXILLARY  LYMPHADENECTOMY COMPLETE Left 01/24/2021    Procedure: sentinel lymph node biopsy;  Surgeon: Sheliah Mends, MD;  Location: [facility] MAIN OR;  Service: Oncology General    PR MASTECTOMY PARTIAL Left 01/24/2021    Procedure: LEFT NEEDLE LOC PARTIAL MASTECTOMY   ;  Surgeon: Sheliah Mends, MD;  Location: [facility] MAIN OR;  Service: Oncology General    ROTATOR CUFF REPAIR Right        Medications:  Current Outpatient Medications   Medication Sig Note    DULoxetine (CYMBALTA) 60 mg DR  capsule TAKE 1 CAPSULE(60 MG) BY MOUTH DAILY FOR MAJOR DEPRESSION (Patient not taking: Reported on 08/02/2022)     metoprolol succinate ER (TOPROL-XL) 100 mg 24 hr tablet Take 2 tablets (200 mg total) by mouth daily. Do not crush or chew. May be divided. (Patient not taking: Reported on 08/02/2022)     ondansetron (ZOFRAN) 4 mg tablet TAKE 1 TABLET(4 MG) BY MOUTH THREE TIMES DAILY AS NEEDED 08/02/2022: daily    escitalopram (LEXAPRO) 5 mg tablet Take 1 tablet (5 mg total) by mouth daily. Weaning off Cymbalta and going back on lexapro. (Patient not taking: Reported on 08/02/2022)     apixaban (ELIQUIS) 5 mg tablet Take 1 tablet (5 mg total) by mouth every 12 hours.     tirzepatide [facility]) 2.5 mg/0.37mL pen Inject 2.5 mg into the skin every 7 days.     DULoxetine (CYMBALTA) 30 mg DR capsule Alternate every other day with the 60mg  to wean off and after 3 weeks go to one a day fr 3 weeks and then go to 30mg  alternating with nothing for three weeks and then stop.     aluminum chloride (DRYSOL) 20 % external solution Apply topically nightly. to the following areas: axilla and wash off then next am (Patient not taking: Reported on 06/11/2022) 06/11/2022: New; has not started yet    levothyroxine (SYNTHROID, LEVOTHROID) 50 mcg tablet TAKE 1 TABLET BY MOUTH EVERY DAY     metoprolol tartrate (LOPRESSOR) 50 mg tablet TAKE 1 TABLET(50 MG) BY MOUTH TWICE DAILY (Patient taking differently: Take 1.5 tablets (75 mg total) by mouth 2 times daily.) 08/02/2022: 100 mg am and pm    acyclovir (ZOVIRAX) 400 mg tablet TAKE 1 TABLET(400 MG) BY MOUTH TWICE DAILY     aspirin 81 mg EC tablet Take 1 tablet (81 mg total) by mouth daily  Please do not continue taking this medication until after you finish your 6 weeks of eliquis for DVT prophylaxis.     atorvastatin (LIPITOR) 40 mg tablet TAKE 1 TABLET BY MOUTH IN THE EVENING     nystatin (MYCOSTATIN) 100000 UNIT/GM cream Apply topically 2 times daily  to the following areas: apply to the rash under  the right breast (Patient taking differently: Apply topically 2 times daily for rash. to the following areas: apply thin layer to the rash under the right breast prn) 10/11/2021: prn       Laboratory Studies:    Lab Results   Component Value Date    NA 138 06/05/2022    K 5.2 (H) 06/05/2022    CL 98 06/05/2022    CA 10.3 (H) 06/05/2022    CO2 26 06/05/2022    UN 19 06/05/2022    CREAT 0.81 06/05/2022    VID25 27 (L) 05/03/2017    VB12 347 10/11/2021    WBC 6.1 06/05/2022    HGB 12.5 06/05/2022    HCT 40 06/05/2022    PLT 266 06/05/2022  TSH 4.02 06/05/2022    HA1C 7.5 (H) 06/05/2022    CHOL 192 12/08/2020    TRIG 170 (!) 12/08/2020    HDL 39 (L) 12/08/2020    LDLC 119 12/08/2020    CHHDC 4.9 12/08/2020    GLU 175 (H) 06/05/2022        Imaging Studies: reviewed PRN             Allergies:  No Known Allergies (drug, envir, food or latex)

## 2022-08-09 ENCOUNTER — Ambulatory Visit: Payer: Medicare Other

## 2022-08-09 VITALS — BP 124/68 | Ht 66.0 in | Wt 265.0 lb

## 2022-08-09 DIAGNOSIS — Z01419 Encounter for gynecological examination (general) (routine) without abnormal findings: Secondary | ICD-10-CM | POA: Insufficient documentation

## 2022-08-09 DIAGNOSIS — N898 Other specified noninflammatory disorders of vagina: Secondary | ICD-10-CM | POA: Insufficient documentation

## 2022-08-09 LAB — HM PAP SMEAR

## 2022-08-09 MED ORDER — NYSTATIN-TRIAMCINOLONE 100000-0.1 UNIT/GM-% EX CREA *I*
TOPICAL_CREAM | Freq: Two times a day (BID) | CUTANEOUS | 0 refills | Status: DC | PRN
Start: 2022-08-09 — End: 2022-12-21

## 2022-08-09 NOTE — Progress Notes (Signed)
Carly Higgins is a 62 y.o. patient No obstetric history on file. who presents for an annual gyn exam.  Last seen in 2018.     Periods are  absent in the setting of menopause.   Denies pmb/spotting.   Pap smear 05/10/2016   Mammogram: UTD per pt. Has her next mammo scheduled.   Last Mammogram: 06/02/2020     Colonoscopy:  last colonoscopy 2019. Pcp following.     Last seen in 2018. Has had a medically complex journey since her last visit. Breast cancer, bone marrow transplant, hip replacement and now struggling with neuropathy.     Additional concerns- vaginal itching / burning intermittently for prolonged period of time.     Patient's medications, allergies, past medical, surgical, social and family histories were reviewed and updated as appropriate.      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    Conditioned w/ Busulfan 130 mg/m2/d and Fludarabine 40 mg/m2/d x4 days followed by 10/10 HLA MUD (F) Allogeneic PBSCT (pt/donor: ABO: A+/A+, CMV N/N)    Pulmonary edema     s/p L THA 10/20/21 10/20/2021    Shortness of breath     Tobacco use     Past Surgical History:   Procedure Laterality Date    BONE MARROW TRANSPLANT  2017    Mediport insertion      PR ARTHRP ACETBLR/PROX FEM PROSTC AGRFT/ALGRFT Left 10/20/2021    Procedure: LEFT POSTERIOR ARTHROPLASTY, HIP, TOTAL;  Surgeon: Fransisca Connors, MD;  Location: [facility] MAIN OR;  Service: Orthopedics    PR AXILLARY LYMPHADENECTOMY COMPLETE Left 01/24/2021    Procedure: sentinel lymph node biopsy;  Surgeon: Sheliah Mends,  MD;  Location: [facility] MAIN OR;  Service: Oncology General    PR MASTECTOMY PARTIAL Left 01/24/2021    Procedure: LEFT NEEDLE LOC PARTIAL MASTECTOMY   ;  Surgeon: Sheliah Mends, MD;  Location: [facility] MAIN OR;  Service: Oncology General    ROTATOR CUFF REPAIR Right       Social History     Socioeconomic History    Marital status: Divorced   Occupational History    Occupation: on disability     Employer: [employer]   Tobacco Use    Smoking status: Former     Packs/day: 0.25     Years: 30.00     Additional pack years: 0.00     Total pack years: 7.50     Types: Cigarettes     Quit date: 02/2021     Years since quitting: 1.4     Passive exposure: Past    Smokeless tobacco: Never   Substance and Sexual Activity    Alcohol use: Yes     Comment: occasional    Drug use: No    Sexual activity: Not Currently   Social History Narrative        Marital status: single    Lives in a second  floor apartment alone     No children    Pets:  1 dog    Hobbies and interests include nothing at this time    Physical activity: none at this time    Family History   Problem Relation Age of Onset    No Known Problems Mother     Throat cancer Father     Heart attack Father     Thyroid disease Sister     No Known Problems Brother     Breast cancer Paternal Aunt     Ovarian cancer Neg Hx     Anesthesia problems Neg Hx       Current Outpatient Medications   Medication Sig    apixaban (ELIQUIS) 5 mg tablet Take 1 tablet (5 mg total) by mouth every 12 hours.    tirzepatide [facility]) 2.5 mg/0.41mL pen Inject 2.5 mg into the skin every 7 days.    DULoxetine (CYMBALTA) 30 mg DR capsule Alternate every other day with the 60mg  to wean off and after 3 weeks go to one a day fr 3 weeks and then go to 30mg  alternating with nothing for three weeks and then stop.    levothyroxine (SYNTHROID, LEVOTHROID) 50 mcg tablet TAKE 1 TABLET BY MOUTH EVERY DAY    metoprolol tartrate (LOPRESSOR) 50 mg tablet TAKE 1 TABLET(50 MG) BY MOUTH TWICE DAILY (Patient  taking differently: Take 1.5 tablets (75 mg total) by mouth 2 times daily.)    acyclovir (ZOVIRAX) 400 mg tablet TAKE 1 TABLET(400 MG) BY MOUTH TWICE DAILY    aspirin 81 mg EC tablet Take 1 tablet (81 mg total) by mouth daily  Please do not continue taking this medication until after you finish your 6 weeks of eliquis for DVT prophylaxis.    atorvastatin (LIPITOR) 40 mg tablet TAKE 1 TABLET BY MOUTH IN THE EVENING    nystatin-triamcinolone (MYCOLOG II) cream Apply topically 2 times daily as needed for Skin Infection due to Candida Yeast. Apply to affected area    cephalexin 500 mg capsule Take 1 capsule (500 mg total) by mouth 3 times daily. (Patient not taking: Reported on 08/09/2022)    ibuprofen (ADVIL) 200 MG tablet Take 3 tablets (600 mg total) by mouth every 6 hours as needed for Pain.    Menthol, Topical Analgesic, (ICY HOT EX) Apply 1 applicator topically as needed (pain).    Menthol, Topical Analgesic, (BIOFREEZE EX) Apply 1 applicator topically as needed (pain).    ondansetron (ZOFRAN) 4 mg tablet TAKE 1 TABLET(4 MG) BY MOUTH THREE TIMES DAILY AS NEEDED    aluminum chloride (DRYSOL) 20 % external solution Apply topically nightly. 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    Diabetes mellitus E11.9    Diverticulosis of rectosigmoid K57.30    Gastroesophageal reflux disease K21.9    Hypothyroidism E03.9    Lumbar radiculopathy M54.16    Moderate recurrent major depression F33.1    Obesity E66.9    Osteoarthrosis M19.90    Ventricular tachycardia I47.20    Neuropathy due to chemotherapeutic drug G62.0, T45.1X5A    Adjustment disorder with mixed anxiety and depressed mood F43.23    Heart murmur R01.1    Female infertility N97.9    PCB (post coital bleeding) N93.0    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    NECK: supple, No mass or adenopathy  LUNGS: Symmetrical respirations unlabored with no audible wheezes.  BREASTS: No palpable masses or nipple discharge. No skin changes. No axillary or clavicular adenopathy. L breast- lumpectomy scar.  R side of chest above breast - mediport still in place.   ABDOMEN: Soft, non-tender, no palpable masses.  PELVIC:   Normal external female genitalia, vulva, vestibule and  clitoris.   Bartholins, Skenes and urethral glands WNL.  Urethra is normal without prolapse or tenderness.  Vagina with no Atrophy, Cystocele, Rectocele, or lesions.  Cervix: Normal appearing, There is no CMT  Uterus:  midline, mobile, and non-tender. No prolapse.  Adnexa are without obvious masses or tenderness.  Perineum normal.  EXTREMITIES: Bilaterally no edema.      Assessment/Plan:  1. Well woman exam with routine gynecological exam    - GYN Cytology; Future  - GYN Cytology    Cotest Pap every 3 years  Prior mammogram and yearly mammogram encouraged and discussed and assistance offered if needed  Colonoscopy referral offered and screening guidelines discussed  COUNSELING provided in areas desired by patient.     2. Vaginal itching    - Vaginitis screen: DNA  probe (VAG); Future  - nystatin-triamcinolone (MYCOLOG II) cream; Apply topically 2 times daily as needed for Skin Infection due to Candida Yeast. Apply to affected area  Dispense: 15 g; Refill: 0  - Vaginitis screen: DNA probe (VAG)    Difficult to ascertain source of pts symptoms.   Cultures per above to rule out infectious etiology.   Plan to follow up via mychart or phone once results become available.   Call office should symptoms persist or worsen.

## 2022-08-10 ENCOUNTER — Ambulatory Visit: Payer: Self-pay | Admitting: Internal Medicine

## 2022-08-10 ENCOUNTER — Telehealth: Payer: Self-pay

## 2022-08-10 ENCOUNTER — Other Ambulatory Visit: Payer: Self-pay

## 2022-08-10 VITALS — BP 100/64 | HR 80 | Temp 97.7°F | Ht 66.0 in | Wt 270.0 lb

## 2022-08-10 DIAGNOSIS — I4891 Unspecified atrial fibrillation: Secondary | ICD-10-CM

## 2022-08-10 DIAGNOSIS — E1140 Type 2 diabetes mellitus with diabetic neuropathy, unspecified: Secondary | ICD-10-CM

## 2022-08-10 DIAGNOSIS — K219 Gastro-esophageal reflux disease without esophagitis: Secondary | ICD-10-CM

## 2022-08-10 DIAGNOSIS — T451X5A Adverse effect of antineoplastic and immunosuppressive drugs, initial encounter: Secondary | ICD-10-CM

## 2022-08-10 DIAGNOSIS — D0590 Unspecified type of carcinoma in situ of unspecified breast: Secondary | ICD-10-CM

## 2022-08-10 DIAGNOSIS — M1990 Unspecified osteoarthritis, unspecified site: Secondary | ICD-10-CM

## 2022-08-10 DIAGNOSIS — E119 Type 2 diabetes mellitus without complications: Secondary | ICD-10-CM

## 2022-08-10 LAB — VAGINITIS SCREEN: DNA PROBE: Vaginitis Screen:DNA Probe: POSITIVE — AB

## 2022-08-10 MED ORDER — METOPROLOL TARTRATE 50 MG PO TABS *I*
100.0000 mg | ORAL_TABLET | Freq: Two times a day (BID) | ORAL | Status: DC
Start: 2022-08-10 — End: 2022-08-14

## 2022-08-10 MED ORDER — TIRZEPATIDE 5 MG/0.5ML SC SOAJ *I*
5.0000 mg | SUBCUTANEOUS | 4 refills | Status: DC
Start: 2022-08-10 — End: 2022-10-01

## 2022-08-10 MED ORDER — FLUCONAZOLE 150 MG PO TABS *I*
150.0000 mg | ORAL_TABLET | Freq: Once | ORAL | 0 refills | Status: AC
Start: 2022-08-10 — End: 2022-08-10

## 2022-08-10 NOTE — Telephone Encounter (Signed)
+   Yeast infection. PO Diflucan sent.

## 2022-08-10 NOTE — Telephone Encounter (Signed)
.  Carly Higgins was notified that recent culture results were positive for yeast.    A prescription for diflucan  was sent to her pharmacy on record. Please take it as directed.    Reviewed strategies for treating and preventing future yeast infections:     Patient was instructed to call this office if symptoms do not resolve 2-3 days after taking medication.    .Patient verbalizes understanding and agreeable with plan

## 2022-08-10 NOTE — Progress Notes (Addendum)
[facility]  Subjective      Carly Higgins, 07-26-1960, had concerns including Pre-op Exam (Patient is having on 8/23-KL).  Has DM neuropathy - is the biggest issue. Saw Dr Kenn File and she referred to neurology.  Seeign retinal specialist next week. Is weaning off cymbalta - is not helping. Metoprolol 50 mg 2 tabs BID; Sees Dr Allena Katz cards today. Mounjaro not helping much on 2.5 mg - will increase to 5 mg    Review of Systems   Constitutional:  Negative for chills, diaphoresis, fever, malaise/fatigue and weight loss.   HENT: Negative.     Eyes: Negative.    Respiratory: Negative.     Cardiovascular: Negative.    Gastrointestinal:         Loose stool;   Genitourinary: Negative.    Musculoskeletal:  Positive for joint pain.   Skin: Negative.    Neurological:  Positive for sensory change (feet pain / neuropathy). Negative for dizziness, seizures, weakness and headaches.   Endo/Heme/Allergies:  Does not bruise/bleed easily.   Psychiatric/Behavioral:  Negative for substance abuse and suicidal ideas.                     Objective     Physical Exam  Vitals reviewed.   Constitutional:       Appearance: Normal appearance.   Cardiovascular:      Rate and Rhythm: Normal rate and regular rhythm.      Heart sounds: Normal heart sounds.   Pulmonary:      Effort: Pulmonary effort is normal.      Breath sounds: Normal breath sounds.   Abdominal:      Palpations: Abdomen is soft.      Tenderness: There is no abdominal tenderness. There is no right CVA tenderness or left CVA tenderness.   Musculoskeletal:      Cervical back: Neck supple.      Right lower leg: No edema.      Left lower leg: No edema.   Lymphadenopathy:      Cervical: No cervical adenopathy.   Skin:     General: Skin is warm and dry.   Neurological:      Mental Status: She is alert and oriented to person, place, and time.      Sensory: No sensory deficit.      Motor: No weakness.      Gait: Gait normal.   Psychiatric:          Mood and Affect: Mood normal.               Vitals       Blood pressure 100/64, pulse 80, temperature 36.5 C (97.7 F), height 1.676 m (5\' 6" ), weight 122.5 kg (270 lb), SpO2 99%.        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 If sx remains symptomatic, may need to consider rescheduling THR. PD      Author: Rayann Heman, NP  Note signed: 08/10/2022

## 2022-08-10 NOTE — Patient Instructions (Addendum)
Will defer questions re: eliquis and coverage prior to surgery to cardiology as well as EKG; Has follow up planned with neurology for the neuropathy issue; No new suggestions today; Appears to be stable for [facility] 8/23 but will await card input before clearing; increase mounjaro to 5 mg weekly;

## 2022-08-13 ENCOUNTER — Ambulatory Visit: Payer: Medicare Other | Attending: Hematology

## 2022-08-13 ENCOUNTER — Other Ambulatory Visit: Payer: Self-pay

## 2022-08-13 DIAGNOSIS — F17210 Nicotine dependence, cigarettes, uncomplicated: Secondary | ICD-10-CM | POA: Insufficient documentation

## 2022-08-13 DIAGNOSIS — Z803 Family history of malignant neoplasm of breast: Secondary | ICD-10-CM | POA: Insufficient documentation

## 2022-08-13 DIAGNOSIS — Z452 Encounter for adjustment and management of vascular access device: Secondary | ICD-10-CM | POA: Insufficient documentation

## 2022-08-13 DIAGNOSIS — D471 Chronic myeloproliferative disease: Secondary | ICD-10-CM | POA: Insufficient documentation

## 2022-08-13 NOTE — Progress Notes (Signed)
Patient presents for port flush.     Port accessed without difficulty. Flushes easily and blood return noted.  Port deaccessed without issues and flushed with Heparin per policy. Gauze dressing placed over site.  Patient discharged to clinic in stable condition.

## 2022-08-14 ENCOUNTER — Encounter: Payer: Self-pay | Admitting: Cardiology

## 2022-08-14 ENCOUNTER — Telehealth: Payer: Self-pay | Admitting: Cardiology

## 2022-08-14 ENCOUNTER — Ambulatory Visit: Payer: Medicare Other | Attending: Cardiology | Admitting: Cardiology

## 2022-08-14 VITALS — BP 118/88 | HR 120 | Ht 66.0 in | Wt 270.3 lb

## 2022-08-14 DIAGNOSIS — I480 Paroxysmal atrial fibrillation: Secondary | ICD-10-CM | POA: Insufficient documentation

## 2022-08-14 MED ORDER — METOPROLOL SUCCINATE 100 MG PO TB24 *I*
150.0000 mg | ORAL_TABLET | Freq: Every day | ORAL | 1 refills | Status: DC
Start: 2022-08-14 — End: 2022-11-14

## 2022-08-14 MED ORDER — AMIODARONE HCL 200 MG PO TABS *I*
200.0000 mg | ORAL_TABLET | Freq: Two times a day (BID) | ORAL | 1 refills | Status: DC
Start: 2022-08-14 — End: 2022-08-24

## 2022-08-14 NOTE — Telephone Encounter (Signed)
Provider to book appointment with: [facility] Team     Reason for Appointment: fuv     Time Frame patient needs to be seen in:2 months     Can Patient go to any office? No   Office Preference: ccv     Who are we contacting with appt information:  Best contact phone number: [phone]    Is this a new patient? No     Who is requesting appointment? Dr Allena Katz     Additional Comments:

## 2022-08-14 NOTE — Progress Notes (Signed)
[facility]        Electrophysiology office Revisit Note    Date of Visit: 08/14/2022 Patient: Carly Higgins   Patients PCP: Volanda Napoleon, MD Patient DOB: 09-04-1960  EMRN: [id number]     Subjective/Reason For Visit     I had the pleasure of seeing Carly Higgins in followup on 08/14/2022.  She is a pleasant 62 year old female with history of palpitations.  She was found to have atrial fibrillation/flutter.  This was confirmed on Holter monitoring and paroxysmal with atrial burden over 50%.  When she is in atrial fibrillation heart rate has been rapid despite use of metoprolol.  Previous LVEF was normal.  She did have hip surgery last year with no issues.  Unfortunately she is feeling more palpitations and arrhythmic symptoms at this time.  She reports no chest pain.  She is planning for hip surgery in a couple weeks on the other hip and meeting with me today given the results of her recent Holter.    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    Conditioned w/ Busulfan 130 mg/m2/d and Fludarabine 40 mg/m2/d x4 days followed by 10/10 HLA MUD (F) Allogeneic PBSCT (pt/donor: ABO: A+/A+, CMV N/N)    Pulmonary edema     s/p L THA 10/20/21 10/20/2021    Shortness of breath     Tobacco use      Past Surgical History:   Procedure Laterality Date    BONE MARROW TRANSPLANT  2017    Mediport insertion      PR ARTHRP ACETBLR/PROX FEM PROSTC AGRFT/ALGRFT Left 10/20/2021    Procedure: LEFT POSTERIOR ARTHROPLASTY, HIP, TOTAL;  Surgeon: Fransisca Connors, MD;   Location: [facility];  Service: Orthopedics    PR AXILLARY LYMPHADENECTOMY COMPLETE Left 01/24/2021    Procedure: sentinel lymph node biopsy;  Surgeon: Sheliah Mends, MD;  Location: [facility];  Service: Oncology General    PR MASTECTOMY PARTIAL Left 01/24/2021    Procedure: LEFT NEEDLE LOC PARTIAL MASTECTOMY   ;  Surgeon: Sheliah Mends, MD;  Location: [facility];  Service: Oncology General    ROTATOR CUFF REPAIR Right      ROS as above  Medications     Current Outpatient Medications   Medication Sig    amiodarone (PACERONE) 200 mg tablet Take 1 tablet (200 mg total) by mouth 2 times daily.    metoprolol succinate ER (TOPROL-XL) 100 mg 24 hr tablet Take 1.5 tablets (150 mg total) by mouth daily. Do not crush or chew. May be divided.    tirzepatide [facility]) 5 MG/0.5ML pen Inject 0.5 mLs (5 mg total) into the skin every 7 days for Type 2 Diabetes.    nystatin-triamcinolone (MYCOLOG II) cream Apply topically 2 times daily as needed for Skin Infection due to Candida Yeast. Apply to affected area    ibuprofen (ADVIL) 200 MG tablet Take 3 tablets (600  mg total) by mouth every 6 hours as needed for Pain.    Menthol, Topical Analgesic, (ICY HOT EX) Apply 1 applicator topically as needed (pain).    Menthol, Topical Analgesic, (BIOFREEZE EX) Apply 1 applicator topically as needed (pain).    ondansetron (ZOFRAN) 4 mg tablet TAKE 1 TABLET(4 MG) BY MOUTH THREE TIMES DAILY AS NEEDED    apixaban (ELIQUIS) 5 mg tablet Take 1 tablet (5 mg total) by mouth every 12 hours.    DULoxetine (CYMBALTA) 30 mg DR capsule Alternate every other day with the 60mg  to wean off and after 3 weeks go to one a day fr 3 weeks and then go to 30mg  alternating with nothing for three weeks and then stop.    levothyroxine (SYNTHROID, LEVOTHROID) 50 mcg tablet TAKE 1 TABLET BY MOUTH EVERY DAY    acyclovir (ZOVIRAX) 400 mg tablet TAKE 1 TABLET(400 MG) BY MOUTH TWICE DAILY    aspirin 81 mg EC tablet Take 1 tablet (81 mg total) by mouth daily   Please do not continue taking this medication until after you finish your 6 weeks of eliquis for DVT prophylaxis.    atorvastatin (LIPITOR) 40 mg tablet TAKE 1 TABLET BY MOUTH IN THE EVENING     Vitals and Physical Exam     Zaylie's  height is 1.676 m (5\' 6" ) and weight is 122.6 kg (270 lb 4.8 oz). Her blood pressure is 118/88 and her pulse is 120 (abnormal). Her oxygen saturation is 96%.  Body mass index is 43.63 kg/m.    Physical Exam  Constitutional:       Appearance: She is well-developed.   Neck:      Vascular: No JVD.   Cardiovascular:      Rate and Rhythm: Rhythm irregular.      Heart sounds: Normal heart sounds.      Comments: Irregularly irregular  Pulmonary:      Effort: Pulmonary effort is normal.      Breath sounds: Normal breath sounds.   Neurological:      Mental Status: She is alert.       Laboratory Data     Hematology:   Results in Past 730 Days  Result Component Current Result Previous Result   WBC 6.6 (08/08/2022) 6.1 (06/05/2022)   Hemoglobin 13.4 (08/08/2022) 12.5 (06/05/2022)   Hematocrit 42 (08/08/2022) 40 (06/05/2022)   Platelets 318 (08/08/2022) 266 (06/05/2022)     Chemistry:   Results in Past 730 Days  Result Component Current Result Previous Result   Sodium 139 (08/08/2022) 138 (06/05/2022)   Potassium 5.3 (H) (08/08/2022) 5.2 (H) (06/05/2022)   Creatinine 0.91 (08/08/2022) 0.81 (06/05/2022)   Glucose 104 (H) (08/08/2022) 175 (H) (06/05/2022)   Calcium 10.0 (08/08/2022) 10.3 (H) (06/05/2022)   Hemoglobin A1C 6.6 (H) (08/08/2022) 7.5 (H) (06/05/2022)   AST 26 (08/08/2022) 31 (06/05/2022)   ALT 29 (08/08/2022) 30 (06/05/2022)   TSH 4.02 (06/05/2022) 2.51 (08/25/2021)     Coagulation Studies:   Results in Past 730 Days  Result Component Current Result Previous Result   aPTT 32.9 (08/08/2022) 32.5 (10/11/2021)   INR 1.2 (H) (08/08/2022) 0.9 (10/11/2021)     Cardiac:   No results found for requested labs within last 730 days.     Lipids:   Results in Past 730 Days  Result Component Current Result Previous Result    Cholesterol 192 (12/08/2020) Not in Time Range   HDL 39 (L) (12/08/2020) Not in Time Range   Triglycerides 170 (!) (12/08/2020) Not  in Time Range   LDL Calculated 119 (12/08/2020) Not in Time Range   Chol/HDL Ratio 4.9 (12/08/2020) Not in Time Range     Cardiac/Imaging Data & Risk Scores            ECHO COMPLETE 07/27/2022    Interpretation Summary    Patient is in atrial fibrillation with a fast ventricular rate of 110-130.    There is concentric hypertrophy. The left ventricular ejection fraction is mildly decreased. The calculated LVEF is 43% globally reduced.    Left atrial enlargement.    There is mild-to-moderate mitral regurgitation.    There is mild tricuspid regurgitation.    The size of the ascending aorta is borderline enlarged. Aortic Diameters: sinus = 2.8 cm, tubular = 3.7 cm, arch = 2.8 cm.    Compared to prior echocardiogram her overall LV function is more globally reduced to 43% and was 65%.  There is noted mitral regurgitation as well as tricuspid agitation.  Patient is in a fast atrial fibrillation.               EPATCH 14-DAY MONITOR 07/19/2022    Narrative  Patient monitored for 14d, analyzable time was 11d 6h starting on 06/20/2022 04:00 pm.  Primary rhythm was Atrial Fibrillation / Flutter. Average heart rate was 99 bpm, Minimum heart rate was 59 bpm on Day 13  / 08:22:54 am, Max heart rate was 196 bpm on Day 2 / 07:26:20 pm  Atrial Fibrillation or Flutter: Burden was 54.95 %, longest event 3d 23h on Day 1 / 04:05:20 pm, fastest event 196 bpm on  Day 1 / 04:05:20 pm  SVE(s): Burden was 0.59 %, 9375 total SVE(s)  SV Arrhythmia(s): 46 event(s), longest event 23 beats on Day 6 / 12:42:21 am, fastest event 141 bpm on Day 8 / 10:31:51  am  PVC(s): Burden was 0.03 %, 430 total PVC(s), 3 disparate morphologies  Ventricular Arrhythmia(s): 1 event(s), longest event 4 beats at Day 15 / 10:25:52 am, fastest event 185 bpm at Day 15 /  10:25:52 am  Patient recorded 4 event(s) during the monitoring  period  Cardioversion not required as patient paroxysmal.             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  female with paroxysmal atrial fibrillation/flutter.  She does have a high burden of arrhythmia and her recent LVEF was reduced likely due to rapid atrial fibrillation/flutter.  In the short-term I like to use amiodarone to help control this as she does have hip surgery upcoming and she would prefer to proceed.  I am hoping in the next week or 2 amiodarone will help keep her rhythm and sinus however if not she will need to have hip surgery delayed.  I also am going to change her metoprolol to Toprol-XL 150 mg a day.  When she does proceed with surgery she can hold Eliquis for 2 days prior to surgery and resume thereafter.  She is going to be in touch with [place] to see how she feels over the next week or so and we will proceed accordingly.          P. Allena Katz, MD  Electronically signed on 08/14/2022 at 10:20  AM.

## 2022-08-15 LAB — GYN CYTOLOGY

## 2022-08-17 ENCOUNTER — Encounter: Payer: Self-pay | Admitting: Internal Medicine

## 2022-08-20 ENCOUNTER — Telehealth: Payer: Self-pay | Admitting: Internal Medicine

## 2022-08-20 MED ORDER — ESCITALOPRAM OXALATE 10 MG PO TABS *I*
10.0000 mg | ORAL_TABLET | Freq: Every day | ORAL | 1 refills | Status: DC
Start: 2022-08-20 — End: 2023-02-23

## 2022-08-20 NOTE — Telephone Encounter (Signed)
Lexapro.  She will need a follow-up visit 4 to 6 weeks if not 1 already not scheduled for follow-up on the medication.  [facility]

## 2022-08-21 NOTE — Telephone Encounter (Signed)
Spoke to pt appt made

## 2022-08-23 ENCOUNTER — Other Ambulatory Visit: Payer: Self-pay

## 2022-08-23 ENCOUNTER — Telehealth: Payer: Self-pay | Admitting: Cardiology

## 2022-08-23 ENCOUNTER — Encounter: Payer: Self-pay | Admitting: Orthopedic Surgery

## 2022-08-23 ENCOUNTER — Ambulatory Visit: Payer: Medicare Other | Admitting: Orthopedic Surgery

## 2022-08-23 VITALS — BP 126/88 | HR 124 | Ht 66.0 in | Wt 271.7 lb

## 2022-08-23 DIAGNOSIS — M1611 Unilateral primary osteoarthritis, right hip: Secondary | ICD-10-CM

## 2022-08-23 DIAGNOSIS — I480 Paroxysmal atrial fibrillation: Secondary | ICD-10-CM

## 2022-08-23 NOTE — Telephone Encounter (Signed)
Spoke with patient. She feels much better on the Amiodarone and change in Toprol and was asked at last visit to call with how she was feeling.      Per office visit notes, she could proceed with surgery if she was in normal rhythm and feeling well.  Will run this by Dr Allena Katz and Ronney Asters and will call patient back with response.

## 2022-08-23 NOTE — Invasive Procedure Plan of Care (Signed)
CONSENT FOR MEDICAL  OR SURGICAL PROCEDURE                            Patient Name: Carly Higgins  [facility] 62 MR                                                              DOB: 03/01/1960         Please read this form or have someone read it to you.   It's important to understand all parts of this form. If something isn't clear, ask [place] to explain.   When you sign it, that means you understand the form and give [place] permission to do this surgery or procedure.     I agree for Fransisca Connors, MD along with any assistants* they may choose, to treat the following condition(s): Pain and inflammation of the hip joint   By doing this surgery or procedure on me: Complete replacement of the hip joint   This is also known as: Total Hip Arthroplasty   Laterality: Right     *if you'd like a list of the assistants, please ask. We can give that to you.    1. The care provider has explained my condition to me. They have told me how the procedure can help me. They have told me about other ways of treating my condition. I understand the care provider cannot guarantee the result of the procedure. If I don't have this procedure, my other choices are: No surgery, conservative treatment    2. The care provider has told me the risks (problems that can happen) of the procedure. I understand there may be unwanted results. The risks that are related to this procedure include: Bleeding, failure of wound healing, abnormal blood clot formation in extremities or lungs (DVT/PE), nerve deficit (injury or temporary loss of function resulting in weakness, numbness), blood vessel injury, instability or dislocation of joint, fracture or break of bone around the implant, damage to muscles/tendons resulting in weakness or gait abnormality, deep infection of the joint, abnormal formation of bone in tissues resulting in stiffness or pain, implant wear, implant loosening, other modes of implant failure (fracture, failure of junctions), bone loss, leg  length discrepancy (one leg longer or shorter than the other), persistent pain, potential loss of limb structure or function especially in the setting of nerve or blood vessel injury, need for reoperation or revision.    Medical risks of anesthesia and surgery include cardiopulmonary (heart and lung), renal (kidney), gastrointestinal (stomach and bowels), neurological (brain, nerve) complications such as stroke, heart attack, and even death.    3. I understand that during the procedure, my care provider may find a condition that we didn't know about before the treatment started. Therefore, I agree that my care provider can perform any other treatment which they think is necessary and available.    4. I give permission to the hospital and/or its departments to examine and keep tissue, blood, body parts, fluids or materials removed from my body during the procedure(s) to aid in diagnosis and treatment, after which they may be used for scientific research or teaching by appropriate persons. If these materials are used for science or teaching, my identity will be protected.  I will no longer own or have any rights to these materials regardless of how they may be used.    5. My care provider might want a representative from a medical device company to be there during my procedure. I understand that person works for:  [employer], [employer]        The ways they might help my care provider during my procedure include:        Helping the operating room staff prepare the special device my care provider wants to use., Providing information and support to operating room staff regarding the device. and Providing information and support to operating room staff regarding the device    6. Here are my decisions about receiving blood, blood products, or tissues. I understand my decisions cover the time before, during and after my procedure, my treatment, and my time in the hospital. After my procedure, if my condition  changes a lot, my care provider will talk with me again about receiving blood or blood products. At that time, my care provider might need me to review and sign another consent form, about getting or refusing blood.    I understand that the blood is from the community blood supply. Volunteers donated the blood, the volunteers were screened for health problems. The blood was examined with very sensitive and accurate tests to look for hepatitis, HIV/AIDS, and other diseases. Before I receive blood, it is tested again to make sure it is the correct type.    My chances of getting a sickness from blood products are small. But no transfusion is 100% safe. I understand that my care provider feels the good I will receive from the blood is greater than the chances of something going wrong. My care provider has answered my questions about blood products.      My decision  about blood or  blood products   Yes, I agree to recieve blood or blood products if my care provider thinks they're needed.        My decision   about tissue  Implants     Yes, I agree to recieve tissue implants if my care provider thinks they're needed.          I understand this  form.    My care provider  or his/her  assistants have  explained:   What I am having done and why I need it.  What other choices I can make instead of having this done.  The benefits and possible risks (problems) to me of having this done.  The benefits and possible risks (problems) to me of receiving transplants, blood, or blood products.  There is no guarantee of the results.  The care provider may not stay with me the entire time that I am in the operating or procedure room.  My provider has explained how this may affect my procedure. My provider has answered my  questions about this.         I give my  permission for  this surgery or  procedure.            _______________________________________________                                     My signature  (or parent or other  person authorized to sign for you, if you are unable to sign for  yourself or if you are under  54 years old)        ______           Date        _____        Time   Electronic Signatures will display at the bottom of the consent form.    Care provider's statement: I have discussed the planned procedure, including the possibility for transfusion of blood  products or receipt of tissue as necessary; expected benefits; the possible complications and risks; and possible alternatives  and their benefits and risks with the patients or the patient's surrogate. In my opinion, the patient or the patient's surrogate  understands the proposed procedure, its risks, benefits and alternatives.              Electronically signed by: Fransisca Connors, MD                                                08/23/2022         Date        9:36 AM        Time

## 2022-08-23 NOTE — Progress Notes (Signed)
Total Hip Arthroplasty Preoperative visit, Surgical Discussion    Carly Higgins presents for preoperative consent visit for upcoming right total hip arthroplasty. She continues to experience debilitating pain in the hip refractory to conservative treatment interventions. It is significantly impacting their quality of life, overall function, and ability to complete activities of daily living and/or participate in activities of enjoyment and recreation. 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    Refer to previous office visit notes and documentation for physical examination findings.  There are no new updates to the patient's physical examination today. She is recently started on Amiodarone for atrial fibrillation, and reports she is tolerating it well without new chest pain or shortness of breath.  She has to follow up with her cardiologist to ensure she is tolerating it well before we proceed.    Right hip:   Skin: no skin lesions, rashes, or discoloration  Prior scars: none   Hip range of motion:  Flexion: 95  Internal rotation: 5  External rotation: 15   Abduction: 40    There is pain with range of motion of the hip, particularly flexion and internal rotation.  Special testing:  Stinchfield maneuver: positive    Distal neurovascular:  Motor: quadriceps, dorsiflexion, plantar flexion, and EHL intact  Sensation: sensation is intact to light touch in saphenous/sural/tibial/deep and superficial peroneal nerve distributions   Circulation: Dorsalis pedis pulses palpable and symmetric bilaterally, toes demonstrate brisk capillary refill.   Peripheral vascular skin changes/ulcers: No     We once again reviewed that the proposed surgery is ideal for treating the pain from inflammation due to joint pathology and that other extra-articular sources of pain may not be fully relieved after full recovery.  Improvements in range of motion, joint stability, and alignment  are secondary reasons to perform this surgery, and improvements in these domains are less predictable. Furthermore, we reviewed the long term implications of undergoing joint replacement including the importance of future activity modifications to avoid high impact activity to maximize implant longevity.  Preoperative preparation, perioperative scheduling, postoperative expectations and rehabilitation were all reviewed in the office today.  She has completed joint replacement education class and is fully informed on preoperative preparation, what to expect on the day of surgery, and expectations about the postoperative hospital stay and recovery from the operation.  She has completed coordinated preoperative medical evaluation with their primary care physician, any necessary medical specialists, and our presurgical testing team and obtained clearance to proceed with the proposed surgery.     The risks and proposed benefits of a total hip arthroplasty were discussed with the patient in detail.  The benefits involve relief of pain and the associated improvement in quality of life as a result of the pain relief.  Improvement in hip range-of-motion or stiffness may or may not be attained and is less predictable.  The risks of the surgery were detailed to the patient and include bleeding, wound healing complications, venous thromboembolic disease (DVT/PE), neural deficit or injury, vascular injury, injury to other soft tissue structures (muscle/tendon/ligament) resulting in instability, pain or gait abnormality (i.e. hip abductor complex), prosthetic joint instability or dislocation, periprosthetic fracture, deep prosthetic joint infection, heterotopic ossification, implant wear, osteolysis due to bearing wear, implant loosening, other modes of implant failure (fracture, dissociation or corrosion), persistent pain, need for future reoperation.  Medical risks were also explained to the patient and include cardiopulmonary,  renal, gastrointestinal and neurological (cognitive) complications.  The patient  comprehends and understands the risks clearly and wishes to proceed with the indicated total hip arthroplasty.      Risk stratification preoperatively for selection of postoperative anticoagulation to reduce their risk of deep vein thrombosis and pulmonary embolism has been or will be coordinated with the patient's care team.  Anticoagulation choices are made based on recommendations from the American Academy of Orthopedic Surgeons guidelines and the American College of Chest Physician guidelines.  For patients at a normal risk, we use aspirin postoperatively to prevent blood clots.  This provides appropriate risk reduction while decreasing the risk of postoperative bleeding and hematoma.  The risk of venous thromboembolism is weighed on an individual case by case basis, and is determined based on well studied individual risk factors.  We also reviewed that scarring around the incision is normal and that there may be some numbness or tenderness around the incision.     We discussed the variety of technical strategies employed to perform this procedure, as well as risks and benefits specific to each particular strategy including specific surgical approaches (anterior vs posterior hip approaches), alignment and implant fixation strategies, the intraoperative use of technology (fluoroscopy, computer navigation, robotic arm assistance, augmented/mixed reality).  We reviewed modern research and evidence assessing the impact of these various techniques on outcomes from the procedure.  We discussed the evidence on arthroplasty bearing surfaces and the risks/benefits to the various combinations of materials available.  Implant selection discussed, along with the proposed fixation strategy and use of technology, if applicable.       Based on the above discussion, informed consent to proceed with the proposed operation was reviewed today and  submitted electronically, to be printed and signed on the day of surgery.  All questions regarding the procedure were discussed and answered to the patient's satisfaction.    Fransisca Connors, MD  Division of Adult Reconstruction  [facility]  Department of Orthopaedics and Rehabilitation

## 2022-08-23 NOTE — Telephone Encounter (Signed)
Patel pt - patient states received new medications from Dr.Patel and will like him to know she is doing well on them .   -patient is having a procedure next week and will let the sergeant know to fax cardiac clearance

## 2022-08-23 NOTE — Patient Instructions (Signed)
Joint Replacement Pre-Operative Instructions    As you prepare in the final days before surgery:    **Please follow the instructions below to read through the surgical consent form:    Log into [facility] MyChart using the application on your smartphone or a web browser.  Navigate to "Appointments and Visits".  Find the appointment that was scheduled with Dr. Kaplan (today) for your preoperative visit.  Choose the "View notes" option.  The consent form will be labeled "Invasive Procedure Plan of Care", please select this and read through it fully  If you have any questions about the information on the consent form, or have difficulty interpreting or understanding the information, please contact the office via phone or MyChart message, or bring questions with you on the day of your surgery.      Please contact Dr. Kaplan and his team at the office ([phone]), or via MyChart message if you have any further questions or concerns in the days leading up to your surgery.       A few other guidelines to note before surgery:    Treating pain after hip/knee replacement:   Anti-inflammatory medications: The anti-inflammatory (Ibuprofen, Naproxen, Celecoxib) can continue to be taken on an as needed basis for pain control if allowed by your primary care physician.  The side effects of non-steroidal, anti-inflammatory pain relievers (NSAIDs) include:  Stomach pain, heartburn (these medications should be taken with meals or on a full stomach)  Stomach ulcers  Thinning of the blood (use caution if you already take blood thinners for other medical reasons)  Allergic reactions: rashes  Kidney problems (do not take these medications if you have a history of kidney disease)  High blood pressure  Warning: If you experience any of the above side effects, stop taking the medication and notify your doctor immediately.    Acetaminophen (Tylenol):  This medication is an effective over the counter pain medicine. It can be combined with the  above anti-inflammatory medication as it has a different mechanism of action and different side effect profile. Taking both of these medications together is more effective than taking either medication by itself.  Use caution not to exceed 3,000-4,000 mg of Acetaminophen in a single 24 hour time period as it can increase your risk of liver-related side effects.  Neuroactive medications: Medications that target nerve-related pain such as gabapentin (Neurontin) or pregabalin (Lyrica) can sometimes be helpful.  Discuss these options with your surgeon, if your pain seems to be nerve-related.  These medications do have side effects including drowsiness, dizziness, dry mouth, and they are metabolized by your liver.  Otherwise, they are relatively well tolerated medications.  Opioid (narcotic) medications: These medications are reserved for immediate postoperative pain, and are not generally recommended for persistent pain and discomfort after your recovery from joint replacement.  They have several side effects, and are highly addictive especially when used for long periods of time.  Moreover, the longer they are used, the less effective they become as your body develops tolerance to these medications.  For several reasons, you should avoid taking opioid medications for prolonged periods of time.  If you continue to have pain following your joint replacement with an unclear source, these medications should be a last resort, and should only be part of a multimodal plan to manage your pain once it has been evaluated and treated by a pain management specialist.    Assist Devices: If you have problems with balance you should continue with the assist   device to be safe.  It is okay to continue using a walker or cane if you are concerned about frequent falls.  An injury from a fall can be devastating for your overall health at any time!    Normal Activities & Exercise:  Exercising regularly, stationary biking, elliptical  machines or swimming are great exercises to minimize traumatic impact to your new joint replacement, while building stamina, endurance and cardiovascular health. Keep in mind that your joint has been replaced with mechanical parts. It requires regular maintenance and care. Your muscles, tendons, ligaments all need to be regularly stretched and strengthened to maintain optimal function of your joint replacement.  Regular physical activity is paramount in maintaining a healthy lifestyle!    The American Association of Hip and Knee Surgeons has these recommendations:  - Practicing low impact exercises, activities, and sports is fine and encouraged following joint replacement.  - Intermediate impact sports are recommended if you have participated in those sports previously. If not, they should not be initiated after joint replacement.  - In general, it is recommended not to participate in high impact activities and sports as many of these can be damaging to the joint replacement parts. If you do elect to    participate in these types of activities, use moderation and always be cautious and protective of your joint replacement!    Going to the Dentist: Do not resume dental work, including routine cleanings, until three months from your surgery date. The American Academy of Orthopedic Surgeons no longer recommends routine antibiotic use before invasive dental procedures.  In some select patients, there may be a benefit to taking preventative antibiotics if risk is high enough.  If you meet any of the below criteria, you may benefit from antibiotics before these procedures, but discuss with your orthopedic surgeon:   You are severely immunocompromised (on chemotherapy or long term oral steroids)  You have previously been treated with surgery for an infection of a replaced joint  You have poorly controlled diabetes (Hemoglobin A1c > 8)    Traveling after joint replacement:  Your implant is likely to set off the metal  detectors in airport security. If you notify the TSA agents prior to screening, they may elect screen using a body scanner to avoid any further extensive screening (i.e. pat down).       Dr. Kaplan's regular follow up schedule after surgery:  -           4 weeks after surgery - 1st postoperative visit, this appointment will be scheduled with either Scott McMorris, NP or Dr. Kaplan.  -           3-4 months after surgery - 2nd routine postoperative visit  -           6 months after surgery - optional 3rd routine postoperative visit  -           1 year after surgery - final routine postoperative visit before long term follow-up     It is very important that you reach out and potentially schedule a follow up visit if you begin to experience any difficulty or trouble with your replaced joint in between scheduled postoperative appointments.

## 2022-08-23 NOTE — Telephone Encounter (Signed)
Spoke with patient. She is very frustrated since she states they did not tell her that she should be in normal rhythm in order to proceed with her hip surgery.    She will come into [facility] office tomorrow (8/16) at 9:30am. Will send message to schedulers to book.         Spampanato, Pina Celeste, PA  Amb Card Ep RnJust now (11:05 AM)     PS  Yes but I am on remotes today and [place] tomorrow  She can go to [facility] and they can call me after her EKG- today or tmr  Call my cell [phone]  I will order it  Can proceed then if in NSR and per Dr Eliane Decree last note, " she can hold Eliquis for 2 days prior to surgery and resume thereafter."    Thanks

## 2022-08-24 ENCOUNTER — Telehealth: Payer: Self-pay | Admitting: Cardiology

## 2022-08-24 ENCOUNTER — Ambulatory Visit: Payer: Medicare Other | Attending: Psychiatry | Admitting: Cardiology

## 2022-08-24 ENCOUNTER — Other Ambulatory Visit: Payer: Self-pay | Admitting: Cardiology

## 2022-08-24 ENCOUNTER — Encounter: Payer: Self-pay | Admitting: Cardiology

## 2022-08-24 VITALS — HR 120

## 2022-08-24 DIAGNOSIS — I4891 Unspecified atrial fibrillation: Secondary | ICD-10-CM | POA: Insufficient documentation

## 2022-08-24 DIAGNOSIS — R9431 Abnormal electrocardiogram [ECG] [EKG]: Secondary | ICD-10-CM | POA: Insufficient documentation

## 2022-08-24 DIAGNOSIS — I517 Cardiomegaly: Secondary | ICD-10-CM | POA: Insufficient documentation

## 2022-08-24 DIAGNOSIS — Z01818 Encounter for other preprocedural examination: Secondary | ICD-10-CM | POA: Insufficient documentation

## 2022-08-24 DIAGNOSIS — I480 Paroxysmal atrial fibrillation: Secondary | ICD-10-CM

## 2022-08-24 MED ORDER — AMIODARONE HCL 400 MG PO TABS *I*
400.0000 mg | ORAL_TABLET | Freq: Two times a day (BID) | ORAL | 0 refills | Status: DC
Start: 2022-08-24 — End: 2022-09-24

## 2022-08-24 MED ORDER — AMIODARONE HCL 200 MG PO TABS *I*
400.0000 mg | ORAL_TABLET | Freq: Two times a day (BID) | ORAL | 0 refills | Status: DC
Start: 2022-08-24 — End: 2022-08-24

## 2022-08-24 NOTE — Telephone Encounter (Signed)
EXUR/MCR-NO AUTH NEEDED

## 2022-08-24 NOTE — Telephone Encounter (Signed)
Routed to [facility] Card[facility] Prior Auth Requests because of prior message copied below.     Patient unsure if she had enough 200's so I created the reorder to ensure it is properly in chart. I did get a warning about the dose. Please let me know if you need me to reach out to [place]. She has EKG on 8/21 at 0800.

## 2022-08-24 NOTE — Progress Notes (Signed)
Patient seen in our clinic for an EKG only per Pina Spampanato. Patient's EKG confirmed to show afib with ventricular rate of 120. Patient is scheduled for hip replacement on 08/31/2022. Spoke with Pina who is offsite and was instructed to have patient increase her amiodarone to 400 mg twice daily and to continue her metoprolol ER 150 mg daily with a follow up EKG only visit on 08/29/2022.     Reviewed instructions with patient and placed on her AVS. She verbalized understanding and will increase the amiodarone with tonight's dose. Patient stated she had been instructed to start holding her Eliquis on 08/29/2022. Her EKG only visit is scheduled at 8 am on 08/29/2022 and patient will wait until verified with Pina or Dr. Allena Katz at time of visit regarding the Eliquis.  Nani Gasser, RN

## 2022-08-24 NOTE — Telephone Encounter (Signed)
Patient added on for an EKG for 8/21

## 2022-08-24 NOTE — Progress Notes (Signed)
Carly Higgins has Afib. She was seen by Dr Allena Katz and placed on Amiodarone 200mg  BID  She is also on  Metoprolol 150mg  daily  She is to have surgery 8/23 and came in today for an EKG to assess her rates   Still noted to be in the 120s  I have asked her to increase her Amiodarone to 400mg  BID  She will return next Wed, 8/21, to reassess rates to see if she can proceed with surgery  Dr Allena Katz will be in the office and can check her EKG and provide her with clearance, if ok to proceed

## 2022-08-24 NOTE — Comprehensive Assessment (Signed)
08/24/22 1102   Contact Information   Relationship to Patient  Parent   Is spokesperson the patient's caregiver after discharge? Yes   Discharge transportation   Ride to/from facility mother        08/24/22 1102   Demographics   [place] Residence [place]  (mothers home)   Ethnicity/Race Caucasian   Primary Language English   Primary Care Taker of? No one   Risk Factors   Risk Factors Adjustment to Dx/Injury/Illness   Living Situation   Lives With Alone   Can they assist patient after discharge? Yes   Home Geography   Type of Home 2 Story home (mother's home)    # Of Steps In Home 0   # Steps to Enter Home   (ramp to enter)   Bedroom First floor   Bathroom First floor - full   Utilitites Working Yes   Palliative Care Assessment   Person assessed Patient   Coping Status Appropriate to Stressor   Current Goal of Care Curative   Baseline ADL functioning   Transfers Independent   Ambulation Independent   Assistive Device   (electric scooter to take dog out and a walker for distance to store)   Bathing/Grooming Independent   Meal Prep Independent   Able to feed self? Yes   Household maintenance/chores Independent   Able to drive? Yes   Home Care Services   Current Home Biochemist, clinical (rollator);Chartered certified accountant (straight);Walker (rolling)     Spoke with pt to review d/c plan after upcoming surgery, chart reviewed.  SW reviewed home care and options, selected [facility] as she had them in the past, referral initiated today. Pt has no concern for d/c to home with support from her mother Okey Regal.  Pt's home is an apt and has elevator access.     Address post surgery:  [address] [facility] [address]    Fernand Parkins MSW  Pager: [phone]

## 2022-08-24 NOTE — Patient Instructions (Signed)
Per telephone conversation with Pina, increase your Amiodarone to 400 mg twice daily, starting tonight (08/27/2022).  Follow up with an EKG on 08/29/22 at [facility] in the morning.

## 2022-08-27 ENCOUNTER — Telehealth: Payer: Self-pay

## 2022-08-27 NOTE — Progress Notes (Shared)
REFERRAL INFORMATION NOTE     Spoke with: Carly Higgins  (patient)    Home visit address/contact phone: [address] [place], [place]  [address]  [facility]  [phone]    Has the patient had any falls (with or without injury) in the last 3 months? no  Has the patient had any near misses (where they would have fallen, but caught self) in the last 3 months? Tripped but didn't fall    Are you currently going to outpatient therapy for any services? no  Do you currently have a nurse or therapist coming to your home to see you? no  Which Agency? N/A    Designated caregiver (name, phone #, and availability): Laurence Slate [phone] M  (brother)     Current DME or supplies in home: walker, cane, shower chair, raised toilet seat, grabber, sock aid    CIRP Patient? no    The following services have been requested by a referring Provider:   ____Nursing   __x__Physical Therapy   ____Occupational Therapy  ____Speech Therapist  ____Medical Social Worker    These services have been arranged with [facility] [phone]:  Primary service for [facility]:  ____Nursing   __x__Physical Therapy     SOC clinician to evaluate for:  ____Physical Therapy   ____Occupational Therapy  ____Speech Therapist  ____Medical Social Worker  __x__N/A    Provider Dr.Kaplan has agreed to the changes in services above.    The agency will call to schedule first home visit date for (CHN,PT etc) services within 24-48 hours of facility discharge, and pt/family are in agreement.    Special instructions for scheduling first visit: none    The following medical equipment and/or supplies have been arranged:  ____walker  ____wheelchair  ____commode  ____shower chair  ____Personal Emergency Response System  ____oxygen  ____cane  ____hospital bed  ____infusion supplies  ____tube feeding supplies  ____catheter supplies  ____ostomy supplies  ____wound care supplies  ____other:   ____No medical equipment and/or supplies have been arranged.    Blood draws:  ***    Focus of care and identified patient/caregiver teaching needs at home: Procedure: RIGHT POSTERIOR ARTHROPLASTY, HIP, TOTAL (Right: Hip)     Confirmed Attending MD [phone] Provider): Fransisca Connors, MD   MD Address/ MD Phone/ MD Fax: [address] Gabriela Eves        [place]             [address]   phone  [phone]  fax [phone]  Confirmed by (person who obtained confirmation): K Zorana Brockwell LPN [facility]   Date of Confirmation: 8/19  Spoke with (person who was spoken with): Jeannene Patella  Method:  ____Secure Chat  x____Telephone Encounter  ____Phone Call  ____Other:     Ancillary MD:     Volanda Napoleon, MD  [phone]     Will sign home care orders related to: Will sign home care orders related to: chronic medical conditions and chronic/routine medications     Ancillary MD Address/Phone/Fax:   [address] [place] [address] [phone]       Insurance Information:  [facility]  Patient/family informed of home care/ DME copays  ____not applicable   ____Yes    Patient has been screened for Medicare as a Secondary Payer (MSP)  ____eligible for [facility]     ____not eligible    MVA or WC case: no  Date of injury/accident:   [employer]  name and address:   Claim #:   Claims Representative phone #/FAX #:     The above arrangements are based on an in-hospital evaluation. It is short-term and will be re-evaluated by the Home Health Care Nurse in the home on a regular basis, as per physician's order.

## 2022-08-27 NOTE — Progress Notes (Signed)
 HOME HEALTH REFERRAL REVIEW - please submit the Ref 34 service request order on the day of discharge.      Completed by: Pablo Lawrence. Spade  Intake LPN Home Care Coordinator    Referred by: Fernand Parkins SW       Source of Information: Medical record       Home Health indicators present: Pt will need follow up at home       Barriers to discharge to be addressed:  Will review       Question Patient?   No     Plan:  Writer reviewed chart. [facility] will continue to follow hospital course and arrange appropriate services at time of discharge.     Thank you.  Kelly L. Spade   LPN [facility] Coordinator  [facility]   [address]  [place], [place] [address]  O: [phone]  [email or web address]  [email or web address]

## 2022-08-27 NOTE — Telephone Encounter (Signed)
[facility] has received a home care referral from [facility] for PT.  I need to confirm if you would sign the Home Care Plan of Care for them upon their discharge.    Reason for home care: Procedure: RIGHT POSTERIOR ARTHROPLASTY, HIP, TOTAL (Right: Hip)     Primary service for [facility]: PT  SOC clinician to evaluate for the following services: n/a    [facility] requires a single physician to sign the overall Home Care Plan of Care (POC) for each patient. The POC, which is a unified document and cannot be split up, will have all orders appearing on it (both attending provider and ancillary provider orders). The POC will also include documentation of the ancillary physician so that any orders or questions related to the ancillary physician's specialty will be directed to them. This single document POC is a regulation and the signature on this document acknowledges the full set of home care services in place for the patient.     Ancillary provider(s) and specialties for pt include: Volanda Napoleon, MD  [phone]     Thank you,  Pablo Lawrence. Schon Zeiders   LPN [facility] Coordinator  [facility]   [address]  [place], [place] [address]  O: [phone]  [email or web address]

## 2022-08-29 ENCOUNTER — Telehealth: Payer: Self-pay | Admitting: Cardiology

## 2022-08-29 ENCOUNTER — Other Ambulatory Visit: Payer: Self-pay

## 2022-08-29 ENCOUNTER — Ambulatory Visit: Payer: Medicare Other

## 2022-08-29 DIAGNOSIS — Z0000 Encounter for general adult medical examination without abnormal findings: Secondary | ICD-10-CM

## 2022-08-29 NOTE — Telephone Encounter (Signed)
08/29/2022 9:45: ECG today reviewed, AF with improved rate control. She is cleared to undergo surgery, and discussed as long as HR remains under control, can proceed with surgery. Reinforced not to miss any dose of Toprol or Amiodarone, which provided rate control with the AF at this time.

## 2022-08-30 ENCOUNTER — Telehealth: Payer: Self-pay

## 2022-08-30 ENCOUNTER — Ambulatory Visit: Payer: Self-pay | Admitting: Internal Medicine

## 2022-08-30 ENCOUNTER — Encounter: Payer: Self-pay | Admitting: Orthopedic Surgery

## 2022-08-30 LAB — EKG 12-LEAD
QRS: 48 deg
QRSD: 81 ms
QT: 356 ms
QTc: 504 ms
Rate: 120 {beats}/min
T: 69 deg

## 2022-08-30 NOTE — Telephone Encounter (Signed)
I called Carly Higgins and told her to restart it.  Carly Higgins

## 2022-08-30 NOTE — Telephone Encounter (Signed)
Carly Higgins called and said that she had to cancel her surgery for tomorrow because she tested positive for Covid this morning.  She would like to know if she should restart her eliquis today.  [name]

## 2022-08-31 ENCOUNTER — Encounter: Admission: RE | Payer: Self-pay | Source: Ambulatory Visit

## 2022-08-31 ENCOUNTER — Inpatient Hospital Stay: Admission: RE | Admit: 2022-08-31 | Payer: Medicare Other | Source: Ambulatory Visit | Admitting: Orthopedic Surgery

## 2022-08-31 ENCOUNTER — Telehealth: Payer: Self-pay

## 2022-08-31 SURGERY — ARTHROPLASTY, HIP, TOTAL
Anesthesia: General | Site: Hip | Laterality: Right

## 2022-08-31 NOTE — Telephone Encounter (Signed)
Spoke with patient she will take amiodarone 200 mg twice a day. She did restart her eliquis since the surgery was cancelled. Will follow up with pink team on 09/12/22. Patient expressed understanding and agreeable to plan. All questions addressed.     Rona Ravens, RN       Patel, Oralia Manis, MD  P Amb Card Ep Rn; Spampanato, [place], [place]; Tinoglio, [name]  Looks like patient was unable to have her surgery because of COVID-positive. Lets cut amiodarone back down to 200 mg twice a day.  Restart her Eliquis.  Schedule to see me or pink in a month.

## 2022-09-10 NOTE — Progress Notes (Unsigned)
[facility]        Electrophysiology Office Revisit Note    Date of Visit: 09/12/2022 Patient: Carly Higgins   Patients PCP: Volanda Napoleon, MD Patient DOB: Dec 15, 1960  EMRN: [id number]     Subjective/Reason For Visit     I had the pleasure of seeing Carly Higgins in cardiology followup on 09/12/2022. She has a history of palpitations correlating to paroxysmal atrial fibrillation and flutter which was confirmed with Holter monitoring. When she is in atrial fibrillation her heart rate has been rapid despite use of metoprolol.   When she was seen in August, amiodarone was initiated and metoprolol changed to succinate.    She had a recent covid infection & had symptoms of fatigue and increased dyspnea    She's been troubled by excessive sweating for a couple years which has been evaluated by her PCP & oncologist.  She was having this in July when she wore a monitor and did not have any significant bradycardia at that time.    She sometimes notices palpitations, no chest pain, some shortness of breath, no presyncope/syncope.  She wakes up gasping for air and has never been tested for sleep apnea.    She restarted apixaban 8/22.    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    Conditioned w/ Busulfan 130 mg/m2/d and Fludarabine 40 mg/m2/d x4 days followed by 10/10 HLA MUD (F) Allogeneic PBSCT (pt/donor: ABO: A+/A+, CMV N/N)    Pulmonary edema     s/p L THA 10/20/21 10/20/2021    Shortness of breath     Tobacco use      Past Surgical History:   Procedure  Laterality Date    BONE MARROW TRANSPLANT  2017    Mediport insertion      PR ARTHRP ACETBLR/PROX FEM PROSTC AGRFT/ALGRFT Left 10/20/2021    Procedure: LEFT POSTERIOR ARTHROPLASTY, HIP, TOTAL;  Surgeon: Fransisca Connors, MD;  Location: [facility] MAIN OR;  Service: Orthopedics    PR AXILLARY LYMPHADENECTOMY COMPLETE Left 01/24/2021    Procedure: sentinel lymph node biopsy;  Surgeon: Sheliah Mends, MD;  Location[facility] MAIN OR;  Service: Oncology General    PR MASTECTOMY PARTIAL Left 01/24/2021    Procedure: LEFT NEEDLE LOC PARTIAL MASTECTOMY   ;  Surgeon: Sheliah Mends, MD;  Location: [facility] MAIN OR;  Service: Oncology General    ROTATOR CUFF REPAIR Right      Review of Systems   Constitutional:  Positive for diaphoresis.   Respiratory:  Positive for shortness of breath.    Cardiovascular:  Positive for palpitations. Negative for chest pain.   Neurological:  Negative for loss of consciousness.     Medications     Current Outpatient Medications   Medication Sig    COLLAGEN PO Take by mouth daily.    amiodarone (PACERONE) 400 MG tablet Take 1 tablet (400 mg total) by mouth 2 times daily. (Patient  taking differently: Take 0.5 tablets (200 mg total) by mouth 2 times daily.)    escitalopram (LEXAPRO) 10 mg tablet Take 1 tablet (10 mg total) by mouth daily.    metoprolol succinate ER (TOPROL-XL) 100 mg 24 hr tablet Take 1.5 tablets (150 mg total) by mouth daily. Do not crush or chew. May be divided.    apixaban (ELIQUIS) 5 mg tablet Take 1 tablet (5 mg total) by mouth every 12 hours.    levothyroxine (SYNTHROID, LEVOTHROID) 50 mcg tablet TAKE 1 TABLET BY MOUTH EVERY DAY    acyclovir (ZOVIRAX) 400 mg tablet TAKE 1 TABLET(400 MG) BY MOUTH TWICE DAILY    atorvastatin (LIPITOR) 40 mg tablet TAKE 1 TABLET BY MOUTH IN THE EVENING    tirzepatide (MOUNJARO) 5 MG/0.5ML pen Inject 0.5 mLs (5 mg total) into the skin every 7 days for Type 2 Diabetes. (Patient not taking: Reported on 09/12/2022)    nystatin-triamcinolone (MYCOLOG II) cream  Apply topically 2 times daily as needed for Skin Infection due to Candida Yeast. Apply to affected area    ibuprofen (ADVIL) 200 MG tablet Take 3 tablets (600 mg total) by mouth every 6 hours as needed for Pain.    Menthol, Topical Analgesic, (ICY HOT EX) Apply 1 applicator topically as needed (pain).    Menthol, Topical Analgesic, (BIOFREEZE EX) Apply 1 applicator topically as needed (pain).    ondansetron (ZOFRAN) 4 mg tablet TAKE 1 TABLET(4 MG) BY MOUTH THREE TIMES DAILY AS NEEDED     Vitals and Physical Exam     Kataleah's  height is 1.676 m (5\' 6" ) and weight is 122 kg (269 lb). Her blood pressure is 124/88 and her pulse is 100. Her oxygen saturation is 100%.  Body mass index is 43.42 kg/m.    Physical Exam  Vitals reviewed.   Constitutional:       General: She is not in acute distress.     Appearance: She is obese.   HENT:      Head: Normocephalic and atraumatic.   Cardiovascular:      Rate and Rhythm: Normal rate. Rhythm irregular.      Heart sounds: No murmur heard.     No friction rub. No gallop.   Pulmonary:      Effort: Pulmonary effort is normal.      Breath sounds: No wheezing, rhonchi or rales.   Musculoskeletal:      Right lower leg: No edema.      Left lower leg: No edema.   Skin:     General: Skin is warm and dry.   Neurological:      General: No focal deficit present.      Mental Status: She is alert.   Psychiatric:         Mood and Affect: Mood normal.       Laboratory Data     Hematology:   Results in Past 730 Days  Result Component Current Result Previous Result   WBC 6.6 (08/08/2022) 6.1 (06/05/2022)   Hemoglobin 13.4 (08/08/2022) 12.5 (06/05/2022)   Hematocrit 42 (08/08/2022) 40 (06/05/2022)   Platelets 318 (08/08/2022) 266 (06/05/2022)     Chemistry:   Results in Past 730 Days  Result Component Current Result Previous Result   Sodium 139 (08/08/2022) 138 (06/05/2022)   Potassium 5.3 (H) (08/08/2022) 5.2 (H) (06/05/2022)   Creatinine 0.91 (08/08/2022) 0.81 (06/05/2022)   Glucose 104 (H) (08/08/2022) 175 (H)  (06/05/2022)   Calcium 10.0 (08/08/2022) 10.3 (H) (06/05/2022)   Hemoglobin  A1C 6.6 (H) (08/08/2022) 7.5 (H) (06/05/2022)   AST 26 (08/08/2022) 31 (06/05/2022)   ALT 29 (08/08/2022) 30 (06/05/2022)   TSH 4.02 (06/05/2022) 2.51 (08/25/2021)     Coagulation Studies:   Results in Past 730 Days  Result Component Current Result Previous Result   aPTT 32.9 (08/08/2022) 32.5 (10/11/2021)   INR 1.2 (H) (08/08/2022) 0.9 (10/11/2021)     Cardiac:   No results found for requested labs within last 730 days.     Lipids:   Results in Past 730 Days  Result Component Current Result Previous Result   Cholesterol 192 (12/08/2020) Not in Time Range   HDL 39 (L) (12/08/2020) Not in Time Range   Triglycerides 170 (!) (12/08/2020) Not in Time Range   LDL Calculated 119 (12/08/2020) Not in Time Range   Chol/HDL Ratio 4.9 (12/08/2020) Not in Time Range     Cardiac/Imaging Data & Risk Scores     ECG: AF with ventricular response 85 bpm, QRSd 79 msec, QTc 446 msec         ECHO COMPLETE 07/27/2022    Interpretation Summary    Patient is in atrial fibrillation with a fast ventricular rate of 110-130.    There is concentric hypertrophy. The left ventricular ejection fraction is mildly decreased. The calculated LVEF is 43% globally reduced.    Left atrial enlargement.    There is mild-to-moderate mitral regurgitation.    There is mild tricuspid regurgitation.    The size of the ascending aorta is borderline enlarged. Aortic Diameters: sinus = 2.8 cm, tubular = 3.7 cm, arch = 2.8 cm.    Compared to prior echocardiogram her overall LV function is more globally reduced to 43% and was 65%.  There is noted mitral regurgitation as well as tricuspid agitation.  Patient is in a fast atrial fibrillation.               EPATCH 14-DAY MONITOR 07/19/2022    Narrative  Patient monitored for 14d, analyzable time was 11d 6h starting on 06/20/2022 04:00 pm.  Primary rhythm was Atrial Fibrillation / Flutter. Average heart rate was 99 bpm, Minimum heart rate was 59 bpm on Day 13  /  08:22:54 am, Max heart rate was 196 bpm on Day 2 / 07:26:20 pm  Atrial Fibrillation or Flutter: Burden was 54.95 %, longest event 3d 23h on Day 1 / 04:05:20 pm, fastest event 196 bpm on  Day 1 / 04:05:20 pm  SVE(s): Burden was 0.59 %, 9375 total SVE(s)  SV Arrhythmia(s): 46 event(s), longest event 23 beats on Day 6 / 12:42:21 am, fastest event 141 bpm on Day 8 / 10:31:51  am  PVC(s): Burden was 0.03 %, 430 total PVC(s), 3 disparate morphologies  Ventricular Arrhythmia(s): 1 event(s), longest event 4 beats at Day 15 / 10:25:52 am, fastest event 185 bpm at Day 15 /  10:25:52 am  Patient recorded 4 event(s) during the monitoring period  Cardioversion not required as patient paroxysmal.          CHA2DS-VASC Risk Scores   CHA2DS2-VASC Score: 3 with a risk of ischemic stroke: 3.2%; stroke/TIA/embolism: 4.6%         Impression and Plan     This is an 61 y.o. female whom we follow for paroxysmal atrial fibrillation and flutter.      Atrial fibrillation and flutter  - diagnosed in June when she presented to an OV with palpitations and ECG confirmed; anticoagulation initiated for CHADS2vasc 3 (apixaban)  - managed  with toprol and amiodarone  - amiodarone increased in Aug due to persistent AF and then decreased back to 200 mg twice daily  - ECG today shows AF with acceptable rate control  - continue amiodarone 200 mg twice daily and arrange cardioversion once she's been on uninterrupted anticoagulation for 3 weeks; decrease amiodarone to 200 mg daily post DCCV  - discussed long term option for atrial fibrillation ablation; she win[place] in [facility] so if she is interested in pursuing this would be after her return from [facility] spring '25  - follow up post DCCV  - will refer to sleep medicine for consultation given symptoms suggestive of sleep apnea    We appreciate the opportunity to participate in the care of your patient. If you have any questions or concerns please feel free to contact our office.       Celinda Dethlefs Marlou Starks,  NP  Electronically signed on 09/12/2022 at 1:05 PM.    I personally spent 44 minutes on the calendar day of the encounter, including pre and post visit work.

## 2022-09-10 NOTE — Patient Instructions (Addendum)
You were seen today for follow up of your arrhythmia (atrial fibrillation and flutter).  You'll be hearing from one of our nurses regarding instructions for the cardioversion.  I placed the referral for a sleep consultation/study to assess for sleep apnea.    Carly Higgins Zekiah Coen, NP  [phone]

## 2022-09-12 ENCOUNTER — Ambulatory Visit: Payer: Medicare Other | Attending: Cardiology | Admitting: Cardiology

## 2022-09-12 ENCOUNTER — Other Ambulatory Visit: Payer: Self-pay

## 2022-09-12 ENCOUNTER — Telehealth: Payer: Self-pay | Admitting: Cardiology

## 2022-09-12 ENCOUNTER — Telehealth: Payer: Self-pay

## 2022-09-12 VITALS — BP 124/88 | HR 100 | Ht 66.0 in | Wt 269.0 lb

## 2022-09-12 DIAGNOSIS — I480 Paroxysmal atrial fibrillation: Secondary | ICD-10-CM | POA: Insufficient documentation

## 2022-09-12 DIAGNOSIS — I4891 Unspecified atrial fibrillation: Secondary | ICD-10-CM | POA: Insufficient documentation

## 2022-09-12 DIAGNOSIS — R29818 Other symptoms and signs involving the nervous system: Secondary | ICD-10-CM | POA: Insufficient documentation

## 2022-09-12 NOTE — Telephone Encounter (Signed)
Pryor Ochoa, NP  P Amb Card Ep Rn  Patient with persistent AF  Anticoagulated with apixaban without missing doses since 8/22  Please arrange DCCV as early as 9/12  Thank you!    Spoke to patient who accepts Mon 9/16 for DCCV.  Restart Eliquis 8/22 and has not missed doses.  Is not currently on Mounjaro.  Labs to be done 1-2 weeks prior.  Instructions reviewed as per letters tab and mailed to home.  Patient expresses understanding and is agreeable to this plan.   JDeans RN

## 2022-09-12 NOTE — Telephone Encounter (Signed)
Per Card EP Pink Patient has been referred to a sleep study.     Patient's Insurance: Medicare part A & B     Prior auth requested

## 2022-09-12 NOTE — Telephone Encounter (Signed)
Home Sleep Study #[id number]  MCR/EXUR- NO AUTH NEEDED    Please schedule at [facility] @ [phone].  Thank you

## 2022-09-12 NOTE — Telephone Encounter (Signed)
Provider to book appointment with: PP or Pink EP     Reason for Appointment:2 month fuv     Time Frame patient needs to be seen in: 2 months     Can Patient go to any office? No   Office Preference: [facility]     Who are we contacting with appt information:  Best contact phone number: [phone]    Is this a new patient? No     Who is requesting appointment? EP Pink     Additional Comments:

## 2022-09-13 LAB — EKG 12-LEAD
QRS: 25 deg
QRSD: 79 ms
QT: 375 ms
QTc: 446 ms
Rate: 85 {beats}/min
T: -7 deg

## 2022-09-13 NOTE — Patient Instructions (Addendum)
Start Lyrica 50 mg twice daily    Your Neuro-Oncology team is:  Physician: Dr. Junie Panning Hemminger  Neuro-oncology Fellows: Dr. Esmond Harps, Dr. Vernell Barrier  Nurse Practitioner: Sherlon Handing (day off Friday)  Registered Nurse: Wilkie Aye (day off Friday), Thurmond Butts (Friday)  Pharmacist: Oswaldo Conroy  Social Work: Earnest Conroy & Cindee Salt (Phone #[phone])    [facility] is located in [address] on the first floor of the [facility].  [facility] is located in [address] on the second floor of the [facility].    Phone: [phone]  Fax: [phone]  Please be prepared to let the secretary know why you are calling. This will help let your nurse know how urgent the call is. We will return your call as soon as possible. The phone is answered by an on-call provider during after hours, weekends and holidays. They can access your chart and assist you with any urgent matters.

## 2022-09-14 ENCOUNTER — Telehealth: Payer: Self-pay

## 2022-09-14 NOTE — Telephone Encounter (Signed)
CALLED & SPOKE TO PATIENT ON 09/14/2022 TO CONFRIM APPT FOR 09/18/2022

## 2022-09-17 ENCOUNTER — Ambulatory Visit
Admission: RE | Admit: 2022-09-17 | Discharge: 2022-09-17 | Disposition: A | Discharge status: Home / Self Care | Payer: Medicare Other | Source: Ambulatory Visit | Attending: Internal Medicine | Admitting: Internal Medicine

## 2022-09-17 DIAGNOSIS — Z803 Family history of malignant neoplasm of breast: Secondary | ICD-10-CM | POA: Insufficient documentation

## 2022-09-17 DIAGNOSIS — Z1231 Encounter for screening mammogram for malignant neoplasm of breast: Secondary | ICD-10-CM | POA: Insufficient documentation

## 2022-09-17 DIAGNOSIS — Z853 Personal history of malignant neoplasm of breast: Secondary | ICD-10-CM | POA: Insufficient documentation

## 2022-09-17 LAB — HM MAMMOGRAPHY

## 2022-09-17 NOTE — Progress Notes (Signed)
Neuro-Oncology New Patient Visit    Date: 09/18/2022    Chief Complaint  Chemotherapy-Induced Peripheral Neuropathy    History of Present Illness   Carly Higgins is a 62 y.o. woman with a past medical history notable for AF (Eliquis), CHF, DM, hypothyroidism, myeloproliferative neoplasm s/p MUD allo and breast cancer who is presenting for evaluation of chemotherapy-induced peripheral neuropathy.     Carly Higgins is unaccompanied at today's visit.     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  She received 4 cycles of doxorubicin and cyclophosphamide followed by left partial mastectomy and SLNB on 01/24/2021.  She then received 4 cycles of weekly paclitaxel and carboplatin as neoadjuvant chemotherapy (last dose 12/08/2020).  Immunotherapy with pembrolizumab was not recommend due to her previous allogenic bone marrow transplant.  Radiation therapy was completed in May 2023.    She reports developing neuropathy in the bilateral feet with her initial treatment prior to mastectomy.  It was relatively mild and she iced her feet during remaining treatments.  Despite that, symptoms significantly worsened.  This month marks 2 years that she has had this degree of pain.  She currently describes pain that she describes as a vice grip, cold ([place]), hard, tight, tingling, numbness.  Primarily toes to ball of foot, but on occasion feels tingle down legs.    Of note, she was scheduled for a right hip replacement in September 2024 and tested positive for Covid delaying the surgery.  It has now been rescheduled for December 2024.  Left hip replaced 2023.    Relevant  Diagnostics  Vitamin B12: 347 on 10/11/2021  Folate: >14 on 10/11/2021  TSH: 4.01 on 06/05/2022   Free T4: 1.0 on 06/07/2016  Hemoglobin A1c: 6.6 on 08/08/2022    Current Neuropathy Treatments  - Nervive     Previous Neuropathy Treatments  - duloxetine 60 mg daily: ineffective and returned to lexapro for depression  - gabapentin 300-600 mg daily prn: ineffective  - tramadol 50 mg every 6 hrs prn pain: ineffective  - Acupuncture x 3: ineffective  - Massage: felt good, but didn't help neuropathy  - Icy hot  - Biofreeze  - Voltaren  - Microwave slippers    Oncology History Overview Note   Carly Higgins is status post 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    In February 2022 she began to experience discomfort in the left breast.  She first attributed this to having been struck with a part of her dog when she picked it up about 1 week earlier.  She continued to experience discomfort intermittently. She scheduled a screening mammogram, having not had an exam since July 2018.         Myelodysplastic or myeloproliferative disease   01/19/2014 Initial Diagnosis    MPN (myeloproliferative neoplasm)     07/15/2020 - 12/08/2020 Chemotherapy    Began cycle 1 of weekly paclitaxel and carboplatin on July 15, 2020.  Immunotherapy with pembrolizumab was not recommended due to her previous allogenic bone marrow transplant.  Final cycle (4) of paclitaxel and carboplatin was administered on September 30, 2020.  Began dose dense cyclophosphamide and doxorubicin on  October 06, 2020    OP Breast CARBOplatin Weekly Paclitaxel then [facility]   Plan Provider: Alison Murray, MD  Treatment goal: Neoadjuvant  Line of treatment: [No plan line of treatment]     IDC left 2 o'clock 2.5 cm, ER/PR- her 2-   05/18/2020 Significant Radiology Findings    Screening [place]: scattered tissue, architectural distortion left 2 o'clock. BIRAD 0       06/07/2020 Significant Radiology  Findings    Ultrasound [place]: irregular hypoechoic mass left 2 o'clock, 19x19x12 mm, 25 mm radial. middle third upper outer quadrant of the left breast. BIRAD 4  Ultrasound guided core IDC     06/09/2020 Procedure    ultrasound-guided core biopsy of the left breast 2:00 lesion:   Biopsy revealed invasive ductal carcinoma.  Provisional combined Nottingham grade was 3 of 3 (tubule formation score 3, nuclear grade 3, mitotic rate score 2).  Tumor cells were negative for estrogen receptors and progesterone receptors.  Tumor cells were negative for HER2 by immunohistochemical staining with a staining score of 1+.  Ki-67 staining was seen in approximately 70% of tumor cells.        06/13/2020 Consultation    She was seen in the Hereditary Oncology clinic :  Because of her prior allogeneic bone marrow transplant, she was not eligible for standard germline testing with blood or saliva specimen.       06/15/2020 Initial Diagnosis    IDC left 2 o'clock 2.5 cm, ER/PR- her 2-     06/15/2020 Cancer Staged    Staging form: Breast, AJCC 8th Edition  - Clinical stage from 06/15/2020: Stage IIB (cT2, cN0, cM0, G3, ER-, PR-, HER2-)     06/15/2020 Consultation    Surgical consultation:  Dr. Claris Gower      06/20/2020 Significant Radiology Findings    Whole-body bone scan : No abnormal focus of radiotracer uptake to suggest osseous metastatic disease.     06/22/2020 Significant Radiology Findings    CT of the chest: mill-defined enhancing lesion in the lateral left breast measuring 1.7 x 1.2 x 1.1 cm.  There were asymmetrically prominent level 1 left axillary lymph nodes, enhancement of uncertain significance, possibly representing metastatic disease. No enlarged mediastinal, hilar, intramammary,  supraclavicular lymph nodes or suspicious pulmonary nodules were identified.       06/22/2020 Significant Radiology Findings    CT of the abdomen pelvis : no evidence of metastatic disease . small fat filled umbilical hernia noted.     06/28/2020  Significant Radiology Findings    MRI of the breast: irregular mass with spiculated margins measuring 10 x 25 x 19 mm in the left breast upper outer quadrant at the 2 o'clock position located 3 cm from the nipple.      07/15/2020 - 12/08/2020 Chemotherapy    Began cycle 1 of weekly paclitaxel and carboplatin on July 15, 2020.  Immunotherapy with pembrolizumab was not recommended due to her previous allogenic bone marrow transplant.  Final cycle (4) of paclitaxel and carboplatin was administered on September 30, 2020.  Began dose dense cyclophosphamide and doxorubicin on October 06, 2020    OP Breast CARBOplatin Weekly Paclitaxel then [facility]   Plan Provider: Alison Murray, MD  Treatment goal: Neoadjuvant  Line of treatment: [No plan line of treatment]     08/18/2020 Procedure    PowerPort in the right internal jugular vein.     12/21/2020 Significant Radiology Findings    Left breast mammogram and ultrasound on  December 21, 2020:  Impression:  Mass in the left breast upper outer quadrant at 2 o'clock located 8 centimeters from the nipple is a known malignancy. Surgical consultation is recommended. Patient completed chemotherapy treatment on 12/08/2020. Patient is scheduled for left lumpectomy on 01/24/2021.     Finding 2:  Normal lymph node and biopsy clip in the left axilla is benign.     Finding 3:  Stable calcifications in the left breast are benign.        01/04/2021 Significant Radiology Findings    MRI breasts on December 15, 2020:  Previously seen irregular spiculated enhancing mass in the left breast does not demonstrate significant residual enhancement. A biopsy clip is noted along the medial margin of the previously seen mass.  There is no axillary or internal mammary lymphadenopathy. In the right breast, there is no evidence of any mass or suspicious enhancements.  Impression:  Area in the left breast is a known malignancy. No significant residual enhancement in the area of previously seen mass, consistent with  excellent/complete radiologic response.        01/24/2021 Surgery    Wire localized partial mastectomy and sentinel lymph node biopsy- Sheliah Mends    Complete pathologic response.      02/07/2021 Cancer Staged    Staging form: Breast, AJCC 8th Edition  - Pathologic stage from 02/07/2021: No Stage Recommended (ypT0, pN0, cM0, GX)         Past Medical/Surgical History   - Reviewed, please see eRecord and HPI for details    Social History  - Reviewed, please see eRecord and HPI for details    Family History   - Reviewed, please see eRecord and HPI for details    Allergies  No Known Allergies (drug, envir, food or latex)    Current Medications  Current Outpatient Medications   Medication Sig    cyanocobalamin (VITAMIN B-12) 500 mcg tablet Take 1 tablet (500 mcg total) by mouth daily for Inadequate Vitamin B12, peripheral neuropathy.    atorvastatin (LIPITOR) 40 mg tablet TAKE 1 TABLET BY MOUTH IN THE EVENING    amiodarone (PACERONE) 400 MG tablet Take 0.5 tablets (200 mg total) by mouth 2 times daily.    pregabalin (LYRICA) 50 mg capsule Take 1 capsule (50 mg total) by mouth 2 times daily for Neuropathic Pain. Max daily dose: 100 mg    COLLAGEN PO Take by mouth daily.    escitalopram (LEXAPRO) 10 mg tablet Take 1 tablet (10 mg total) by mouth daily.    metoprolol succinate ER (TOPROL-XL) 100 mg 24 hr tablet Take 1.5 tablets (150 mg total) by mouth daily. Do not crush or chew. May be divided.    tirzepatide [facility]) 5 MG/0.5ML pen Inject 0.5 mLs (5 mg total) into the skin every 7 days for Type 2 Diabetes. (Patient not taking: Reported on 09/12/2022)    nystatin-triamcinolone (MYCOLOG II) cream Apply topically 2 times daily as needed for Skin Infection due to Candida Yeast. Apply to affected area    ibuprofen (ADVIL) 200 MG tablet Take 3 tablets (600 mg total) by mouth every 6 hours as needed for Pain.    Menthol, Topical Analgesic, (ICY HOT EX) Apply 1 applicator topically as needed (pain).    Menthol, Topical  Analgesic, (BIOFREEZE EX) Apply 1 applicator topically as needed (pain).    ondansetron (ZOFRAN) 4 mg tablet TAKE 1 TABLET(4 MG) BY MOUTH THREE TIMES DAILY AS NEEDED    apixaban (ELIQUIS) 5 mg tablet Take  1 tablet (5 mg total) by mouth every 12 hours.    levothyroxine (SYNTHROID, LEVOTHROID) 50 mcg tablet TAKE 1 TABLET BY MOUTH EVERY DAY    acyclovir (ZOVIRAX) 400 mg tablet TAKE 1 TABLET(400 MG) BY MOUTH TWICE DAILY        Review of Systems:  Notable ROS as per HPI, otherwise all (>11) systems were negative.    Vital Signs   Vitals:    09/18/22 1419   BP: 113/71   BP Location: Left arm   Patient Position: Sitting   Cuff Size: large adult   Pulse: 91   Resp: 16   Temp: 36.2 C (97.2 F)   TempSrc: Temporal   SpO2: 96%   Weight: 121.5 kg (267 lb 13.7 oz)   Height: 167.1 cm (5' 5.79")       Physical Exam:   General: Patient is well-appearing. No acute distress.  HEENT: Incision appears clean, dry, intact.  Pulmonary: Normal work of breathing on RA.  Ext: No edema or deformities.    Neurologic Exam:  Mental Status: Awake and alert. Oriented to conversation. Speech fluent. Comprehension intact, follows complex commands.Affect appropriate.  Cranial Nerves:       II (R/L): Pupils 3/3 to 2/2, fields intact to confrontation bilaterally.    III/IV/VI: Versions intact without nystagmus, no gaze preference.    V: Facial sensation symmetric to light touch.    VII: Facial expression symmetric at rest and with activation.    VIII: Hearing intact to voice.    IX/X: Palate elevates symmetrically.    XI: Shoulder shrug symmetric.    XII: Tongue midline.  Motor (R/L): Bulk and tone were normal throughout. Strength was full on confrontational testing. Pronator drift was absent. There were no abnormal movements. Normal finger tapping bilaterally.  Sensory (R/L): Sensation to light touch and pinprick was symmetric throughout. There is no extinction. Romberg was absent.  Coordination: Finger to nose and heel to shin were intact.  Reflexes  (R/L): 1+ and symmetric. There is no ankle clonus.  Downgoing toes bilaterally.  Gait: Narrow based and normal on casual gait.     Laboratory Data  Personally reviewed and notable for:    08/08/2022:  CBC non-contributory    CMP notable for glucose 104    Pathology report 06/09/2020:          Neuroimaging  - No relevant CNS imaging available for review.    EMG/NCS  No results found    Impression and Plan:  Carly Higgins is a 62 y.o. woman with history of AF on Eliquis, CHF, DM, hypothyroidism, myeloproliferative neoplasm and breast cancer who is presenting to the Neuro-Oncology Clinic with chemotherapy-induced peripheral neuropathy. All laboratory and imaging data were personally reviewed as noted above.     Carly Higgins has a history and physical examination consistent with chemotherapy-induced peripheral neuropathy.  The pathophysiology of CIPN was explained.  Her pain is severe and has persisted for 2 years.  I am concerned that she may not see any significant improvement with her last chemotherapy dose being administered 20 months ago.  My hope is to improve her pain with the use of pharmacologic agents.  It was advised that she start Lyrica 50 mg twice daily and we can dose escalate as needed.  I would also like to check a vitamin B12 level to see if she is deficient which could contribute to symptoms (last vitamin B12 level was 347 in 2023).    She has diabetes mellitus  with a HgbA1c of 6.6 and it was explained that it is imperative that she maintain good control to prevent the neuropathy from worsening.    Chemotherapy-Induced Peripheral Neuropathy  - Start Lyrica 50 mg twice daily  - Check vitamin B12 level  - Maintain good control of diabetes  - Encouraged well-balanced diet and routine physical activity    Please feel free to contact me with further questions or concerns.    Follow up: 2-3 months    Ariel Dimitri, NP  Neuro-Oncology    The patient will continue to be seen in our clinic as part of a  longitudinal relationship for the complex medical condition of chemotherapy-induced peripheral neuropathy which requires specialized clinical knowledge and experience.

## 2022-09-17 NOTE — Result Encounter Note (Signed)
Seen  Carly G CAHN-HIDALGO, MD

## 2022-09-18 ENCOUNTER — Encounter: Payer: Self-pay | Admitting: Cardiology

## 2022-09-18 ENCOUNTER — Ambulatory Visit: Payer: Medicare Other | Attending: Thoracic Diseases | Admitting: Thoracic Diseases

## 2022-09-18 ENCOUNTER — Other Ambulatory Visit: Payer: Self-pay

## 2022-09-18 ENCOUNTER — Encounter: Payer: Self-pay | Admitting: Internal Medicine

## 2022-09-18 ENCOUNTER — Other Ambulatory Visit
Admission: RE | Admit: 2022-09-18 | Discharge: 2022-09-18 | Disposition: A | Discharge status: Home / Self Care | Payer: Medicare Other | Source: Ambulatory Visit

## 2022-09-18 VITALS — BP 113/71 | HR 91 | Temp 97.2°F | Resp 16 | Ht 65.79 in | Wt 267.9 lb

## 2022-09-18 DIAGNOSIS — C50412 Malignant neoplasm of upper-outer quadrant of left female breast: Secondary | ICD-10-CM | POA: Insufficient documentation

## 2022-09-18 DIAGNOSIS — I4891 Unspecified atrial fibrillation: Secondary | ICD-10-CM | POA: Insufficient documentation

## 2022-09-18 DIAGNOSIS — Z171 Estrogen receptor negative status [ER-]: Secondary | ICD-10-CM | POA: Insufficient documentation

## 2022-09-18 DIAGNOSIS — T451X5A Adverse effect of antineoplastic and immunosuppressive drugs, initial encounter: Secondary | ICD-10-CM | POA: Insufficient documentation

## 2022-09-18 DIAGNOSIS — G620 Drug-induced polyneuropathy: Secondary | ICD-10-CM | POA: Insufficient documentation

## 2022-09-18 LAB — CBC AND DIFFERENTIAL
Baso # K/uL: 0 10*3/uL (ref 0.0–0.2)
Eos # K/uL: 0.1 10*3/uL (ref 0.0–0.5)
Hematocrit: 40 % (ref 34–49)
Hemoglobin: 12.5 g/dL (ref 11.2–16.0)
IMM Granulocytes #: 0 10*3/uL (ref 0.0–0.0)
IMM Granulocytes: 0.6 %
Lymph # K/uL: 1.6 10*3/uL (ref 1.0–5.0)
MCV: 93 fL (ref 75–100)
Mono # K/uL: 0.5 10*3/uL (ref 0.1–1.0)
Neut # K/uL: 3.2 10*3/uL (ref 1.5–6.5)
Nucl RBC # K/uL: 0 10*3/uL (ref 0.0–0.0)
Nucl RBC %: 0 /100 WBC (ref 0.0–0.2)
Platelets: 241 10*3/uL (ref 150–450)
RBC: 4.3 MIL/uL (ref 4.0–5.5)
RDW: 15.8 % — ABNORMAL HIGH (ref 0.0–15.0)
Seg Neut %: 58.7 %
WBC: 5.4 10*3/uL (ref 3.5–11.0)

## 2022-09-18 LAB — COMPREHENSIVE METABOLIC PANEL
ALT: 19 U/L (ref 0–35)
AST: 21 U/L (ref 0–35)
Albumin: 4.1 g/dL (ref 3.5–5.2)
Alk Phos: 111 U/L — ABNORMAL HIGH (ref 35–105)
Anion Gap: 13 (ref 7–16)
Bilirubin,Total: 0.6 mg/dL (ref 0.0–1.2)
CO2: 24 mmol/L (ref 20–28)
Calcium: 9.3 mg/dL (ref 8.6–10.2)
Chloride: 101 mmol/L (ref 96–108)
Creatinine: 0.92 mg/dL (ref 0.51–0.95)
Glucose: 203 mg/dL — ABNORMAL HIGH (ref 60–99)
Lab: 16 mg/dL (ref 6–20)
Potassium: 4.8 mmol/L (ref 3.3–5.1)
Sodium: 138 mmol/L (ref 133–145)
Total Protein: 6.5 g/dL (ref 6.3–7.7)
eGFR BY CREAT: 70 *

## 2022-09-18 LAB — NEUTROPHIL #-INSTRUMENT: Neutrophil #-Instrument: 3.2 10*3/uL

## 2022-09-18 LAB — VITAMIN B12: Vitamin B12: 324 pg/mL (ref 232–1245)

## 2022-09-18 LAB — MULTIPLE ORDERING DOCS

## 2022-09-18 LAB — MAGNESIUM: Magnesium: 1.9 mg/dL (ref 1.6–2.5)

## 2022-09-18 MED ORDER — PREGABALIN 50 MG PO CAPS *I*
50.0000 mg | ORAL_CAPSULE | Freq: Two times a day (BID) | ORAL | 0 refills | Status: DC
Start: 2022-09-18 — End: 2022-10-01

## 2022-09-24 ENCOUNTER — Telehealth: Payer: Self-pay

## 2022-09-24 ENCOUNTER — Other Ambulatory Visit: Payer: Self-pay | Admitting: Internal Medicine

## 2022-09-24 ENCOUNTER — Ambulatory Visit: Payer: Self-pay | Admitting: Anesthesiology

## 2022-09-24 ENCOUNTER — Ambulatory Visit
Admission: RE | Admit: 2022-09-24 | Discharge: 2022-09-24 | Disposition: A | Discharge status: Home / Self Care | Payer: Medicare Other | Source: Ambulatory Visit | Attending: Cardiology | Admitting: Cardiology

## 2022-09-24 ENCOUNTER — Encounter
Admission: RE | Disposition: A | Discharge status: Home / Self Care | Payer: Self-pay | Source: Ambulatory Visit | Attending: Cardiology

## 2022-09-24 ENCOUNTER — Telehealth: Payer: Self-pay | Admitting: Cardiology

## 2022-09-24 DIAGNOSIS — I480 Paroxysmal atrial fibrillation: Secondary | ICD-10-CM

## 2022-09-24 DIAGNOSIS — Z7901 Long term (current) use of anticoagulants: Secondary | ICD-10-CM | POA: Insufficient documentation

## 2022-09-24 DIAGNOSIS — I4891 Unspecified atrial fibrillation: Secondary | ICD-10-CM

## 2022-09-24 DIAGNOSIS — Z0189 Encounter for other specified special examinations: Secondary | ICD-10-CM | POA: Insufficient documentation

## 2022-09-24 HISTORY — PX: PR CARDIOVERSION ELECTIVE ARRHYTHMIA EXTERNAL: 92960

## 2022-09-24 SURGERY — CARDIOVERSION
Anesthesia: Monitor Anesthesia Care

## 2022-09-24 MED ORDER — AMIODARONE HCL 400 MG PO TABS *I*
200.0000 mg | ORAL_TABLET | Freq: Two times a day (BID) | ORAL | Status: DC
Start: 2022-09-24 — End: 2022-11-14

## 2022-09-24 MED ORDER — SODIUM CHLORIDE 0.9 % IV SOLN WRAPPED *I*
Status: DC | PRN
Start: 2022-09-24 — End: 2022-09-24

## 2022-09-24 MED ORDER — PROPOFOL 10 MG/ML IV EMUL (INTERMITTENT DOSING) WRAPPED *I*
INTRAVENOUS | Status: DC | PRN
Start: 2022-09-24 — End: 2022-09-24
  Administered 2022-09-24: 80 mg via INTRAVENOUS
  Administered 2022-09-24: 20 mg via INTRAVENOUS

## 2022-09-24 MED ORDER — HYDROCORTISONE 1 % EX CREA *I*
TOPICAL_CREAM | CUTANEOUS | Status: AC
Start: 2022-09-24 — End: 2022-09-24
  Filled 2022-09-24: qty 28

## 2022-09-24 MED ORDER — HYDROCORTISONE 1 % EX CREA *I*
TOPICAL_CREAM | Freq: Two times a day (BID) | CUTANEOUS | Status: DC
Start: 2022-09-24 — End: 2022-09-24

## 2022-09-24 SURGICAL SUPPLY — 1 items: ELECTRODE DEFIBRILLATOR ADULT MULTIFUNCTION EDGE SYSTEM RTS (Supply) ×1 IMPLANT

## 2022-09-24 NOTE — Telephone Encounter (Signed)
LMOM to reduce amiodarone to 200 mg daily. Will send mychart as well.     Rona Ravens, RN

## 2022-09-24 NOTE — Anesthesia Postprocedure Evaluation (Signed)
Anesthesia Post-Op Note    Patient: Carly Higgins    Procedure(s) Performed:  Procedure Summary  Date:  09/24/2022 Anesthesia Start: 09/24/2022  8:52 AM Anesthesia Stop: 09/24/2022  9:12 AM Room / Location:  [facility] / [facility]   Procedure(s):  Cardioversion Diagnosis:  afib Surgeon(s):  Katherine Roan, NP Responsible Anesthesia Provider:  Rolm Bookbinder, MD         Recovery Vitals  BP: 105/79 (09/24/2022  9:15 AM)  Heart Rate: 92 (09/24/2022  9:15 AM)  Resp: 17 (09/24/2022  9:15 AM)  Temp: 35.9 C (96.6 F) (09/24/2022  9:15 AM)  SpO2: 98 % (09/24/2022  9:15 AM)   0-10 Scale: 0 (09/24/2022  9:15 AM)    Anesthesia type:  MAC  Complications Noted During Procedure or in PACU:  None   Comment:    Patient Location:  PACU  Level of Consciousness:    Recovered to baseline  Patient Participation:     Able to participate  Temperature Status:    Normothermic  Oxygen Saturation:    Within patient's normal range  Cardiac Status:   within patient's normal range  Fluid Status:    Stable  Airway Patency:     Yes  Pulmonary Status:    Baseline  Neuraxial Block Evaluation:    Block resolving  Pain Management:    Adequate analgesia  Nausea and Vomiting:  None    Post Op Assessment:    Tolerated procedure well  Responsible Anesthesia Provider Attestation:  All indicated post anesthesia care provided       -

## 2022-09-24 NOTE — Telephone Encounter (Signed)
SCHEDULED AND WILL BE ON DISCHARGE - DONE

## 2022-09-24 NOTE — Preop H&P (Signed)
Pre-EP Procedure Note      Subjective:     62 year old female with a history of symptomatic paroxysmal atrial fibrillation who is referred for Cardioversion.  Denies any missed doses of Eliquis in the last 3 weeks.     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    Conditioned w/ Busulfan 130 mg/m2/d and Fludarabine 40 mg/m2/d x4 days followed by 10/10 HLA MUD (F) Allogeneic PBSCT (pt/donor: ABO: A+/A+, CMV N/N)    Pulmonary edema     s/p L THA 10/20/21 10/20/2021    Shortness of breath     Tobacco use        Social History     Tobacco Use   Smoking Status Former    Packs/day: 0.25    Years: 30.00    Additional pack years: 0.00    Total pack years: 7.50    Types: Cigarettes    Quit date: 02/2021    Years since quitting: 1.6    Passive exposure: Past   Smokeless Tobacco Never       Allergies:  No Known Allergies (drug, envir, food or latex)    Prior to Admission Medications:  Medications Prior to Admission   Medication Sig    amiodarone (PACERONE) 400 MG tablet Take 1 tablet (400 mg total) by mouth 2 times daily. (Patient taking differently: Take 0.5 tablets (200 mg total) by mouth 2 times daily.)    metoprolol succinate ER (TOPROL-XL) 100 mg 24 hr tablet Take 1.5 tablets (150 mg total) by mouth daily. Do not crush or chew. May be divided.    apixaban (ELIQUIS) 5 mg tablet Take 1 tablet (5 mg total) by mouth every 12 hours.    levothyroxine (SYNTHROID, LEVOTHROID) 50 mcg tablet TAKE 1 TABLET BY MOUTH EVERY DAY    pregabalin (LYRICA) 50 mg capsule Take 1 capsule (50 mg  total) by mouth 2 times daily for Neuropathic Pain. Max daily dose: 100 mg    COLLAGEN PO Take by mouth daily.    escitalopram (LEXAPRO) 10 mg tablet Take 1 tablet (10 mg total) by mouth daily.    tirzepatide [facility]) 5 MG/0.5ML pen Inject 0.5 mLs (5 mg total) into the skin every 7 days for Type 2 Diabetes. (Patient not taking: Reported on 09/12/2022)    nystatin-triamcinolone (MYCOLOG II) cream Apply topically 2 times daily as needed for Skin Infection due to Candida Yeast. Apply to affected area    ibuprofen (ADVIL) 200 MG tablet Take 3 tablets (600 mg total) by mouth every 6 hours as needed for Pain.    Menthol, Topical Analgesic, (ICY HOT EX) Apply 1 applicator topically as needed (pain).    Menthol, Topical Analgesic, (BIOFREEZE EX) Apply 1 applicator topically as needed (pain).    ondansetron (ZOFRAN) 4 mg tablet TAKE 1 TABLET(4 MG) BY MOUTH THREE TIMES DAILY AS NEEDED    acyclovir (ZOVIRAX) 400 mg tablet TAKE 1 TABLET(400  MG) BY MOUTH TWICE DAILY    atorvastatin (LIPITOR) 40 mg tablet TAKE 1 TABLET BY MOUTH IN THE [facility] Medications:  No current facility-administered medications for this encounter.       Objective:   Vitals:  Blood pressure 142/88, pulse 95, temperature 36 C (96.8 F), temperature source Temporal, resp. rate 16, height 1.671 m (5' 5.79"), weight 124 kg (273 lb 5.9 oz), SpO2 96%.    Vitals in last 24 hrs:  Patient Vitals for the past 24 hrs:   BP Temp Temp src Pulse Resp SpO2 Height Weight   09/24/22 0730 142/88 36 C (96.8 F) TEMPORAL 95 16 96 % 1.671 m (5' 5.79") 124 kg (273 lb 5.9 oz)               Physical Exam  GEN - AO, NAD  NECK - JVD not elevated  PULM - BS bil, no w/r/s/c/acc muscle use  C/V - Irregular rate, S1, S2, No m/r/g  ABD - NABS, soft, nt/nd  EXT - No LE edema  NM - Answering questions appropriately, sits up independently          Lab Review  Lab Results   Component Value Date    NA 138 09/18/2022    K 4.8 09/18/2022    CL 101 09/18/2022    CO2 24  09/18/2022    UN 16 09/18/2022    CREAT 0.92 09/18/2022    CREAT 0.87 07/03/2016    CA 9.3 09/18/2022    GLU 203 (H) 09/18/2022    WBC 5.4 09/18/2022    HCT 40 09/18/2022    HGB 12.5 09/18/2022    MCV 93 09/18/2022    PLT 241 09/18/2022    GFRB 92 11/05/2019    GFRC 80 11/05/2019    PTT 32.9 08/08/2022    INR 1.2 (H) 08/08/2022    PTI 13.5 (H) 08/08/2022       Anesthesiologist's Physical Status Rating of the Patient:  Class III: Severe Systemic Disease    Plan for Sedation:  General Anesthesia. See anesthesia record for details.   Assessment:   Carly Higgins is a 62 y.o. female with symptomatic afib.  Indications for procedure:  highly symptomatic atrial fibrillation    Plan:   Proceed with Cardioversion      Author:  Katherine Roan, NP  on 09/24/2022 at 7:52 AM

## 2022-09-24 NOTE — Progress Notes (Addendum)
0915: pt arrived to [facility] PACU s/p cardioversion (unsuccessful). EKG completed, pt still in A fib. Pt A&Ox3, VSS, no c/o pain. One hour bedrest. Plan for SDDC if medically stable.  1026: AVS reviewed with pt. Pt ambulatory w/ FWW, tolerated well. IV removed. D/C home with mom.

## 2022-09-24 NOTE — Anesthesia Procedure Notes (Signed)
---------------------------------------------------------------------------------------------------------------------------------------    AIRWAY   GENERAL INFORMATION AND STAFF       Date of Procedure: 09/24/2022 9:05 AM  AIRWAY METHOD     Number of Attempts at Approach:  1    Number of Other Approaches Attempted:  0  FINAL AIRWAY DETAILS    Final Airway Type:  Mask    Type of Mask: simple face  ----------------------------------------------------------------------------------------------------------------------------------------

## 2022-09-24 NOTE — Discharge Instructions (Addendum)
[facility]  Electrophysiology Lab  Patient Discharge Instructions    Attending Physician:  Raleigh Callas, NP                                         Date:09/24/2022  Procedure: Cardioversion # 1 (remains on atrial fibrillation despite 3 shocks)    DIET  Resume your previous diet.    PAIN MANAGEMENT/OTHER  For mild pain you can take acetaminophen (Tylenol) as needed.  If you develop itching where the patches were placed you can apply Hydrocortisone ointment to the affect area.    ACTIVITY  Resume your usual activities tomorrow.  If you were driving prior to your procedure do not drive, operate heavy machinery, climb any ladders or chairs, or make any major life decision for 24 hours.    BATHING/SHOWERING  No restrictions.    MEDICATIONS  Resume your previous medications as outlined in the medication section of your discharge instructions.   If you have new prescriptions please fill them right away.    ANTICOAGULATION  You are on Eliquis. It is very important you continue this until your cardiologist tells you that it is ok to stop.    Within 24 hours of hospital discharge:  If you have a procedure-related issue within 24 hrs of your procedure, please contact the electrophysiology doctor on call at [phone].    24 hours after hospital discharge  If you experience chest pain, shortness of breath, or other problems after 24 hours of hospital discharge call Volanda Napoleon, MD at [phone].    If you have a procedure-related issue after 24 hours, call Dr. Allena Katz at [phone].    DAILY WEIGHT  For some conditions (such as heart failure) you will need to weigh yourself every day at the same time on the same scale, preferably after you empty your bladder. If you have an increase of 3 pounds in 2 days or 5 pounds in 1 week you should contact your physician. A daily written log of your weights is a good way to keep track. You should follow your doctor's recommendations on monitoring your  weight.    SMOKING CESSATION  Smoking can increase your chances of developing chronic health problems or worsen conditions you already have.  If you smoke you should quit.  Medications to help you quit are available.  Ask your doctor if you would like to receive these medications.    FOLLOW-UP   You have a follow-up appointment with Dr. Allena Katz [facility]) on 11/28/22 at 10:30 am         Provider Signature: Sandford Craze, NP

## 2022-09-24 NOTE — Telephone Encounter (Signed)
Patel, Oralia Manis, MD  Tinoglio, [name]; P Amb Card Ep Rn; P Amb Card Ep Support Staff  Patient with cardioversion that failed today.    [name], Move appt to 11/6, ok to use a new slot.    Nursing, please have patient reduce amiodarone to 200mg  daily.      Procedure: PFA A-fib ablation    Estimated procedure time: 2 hours    Diagnosis: Atrial fibrillation    Date (if known): 11/21.  Patient going to [place] in December.    Referring provider: Allena Katz    3D mapping/ablation/transseptal: [facility] ablation with [employer]: as above    Anticoag hold: Hold Eliquis day of procedure    Antiarrhythmics/medications hold: none    Anesthesia type: General    Labs: CBC/BMP    Special notes/instructions: none.

## 2022-09-24 NOTE — Anesthesia Preprocedure Evaluation (Addendum)
Anesthesia Pre-operative History and Physical for Carly Higgins    Highlighted Issues for this Procedure:  62 y.o. female with afib presenting for Procedure(s) (LRB):  Cardioversion (N/A) by Surgeon(s):  Katherine Roan, NP scheduled for 30 minutes.  BMI Readings from Last 1 Encounters:  09/24/22 : 44.41 kg/m        .  Marland Kitchen  Anesthesia Evaluation   ANESTHESIA HISTORY     Denies anesthesia history    GENERAL    + Obesity     PULMONARY    + Shortness of Breath    CARDIOVASCULAR    + Hx of Dysrhythmias          atrial fib    + CHF    GI/HEPATIC/RENAL       + GERD NEURO/PSYCH/ORTHO    + Neuropsychiatric Issues          anxiety    + Neuromuscular disease    ENDO/OTHER    + Diabetes Mellitus          Type 2    + Thyroid Disease          HYPOthyroid     HEMATOLOGIC    + Arthritis     Physical Exam    Airway            Mouth opening: normal            Mallampati: II            TM distance (fb): >3 FB            TM distance (cm): 3            Neck ROM: full  Dental   Normal Exam   Cardiovascular  Normal Exam      Neurologic    Normal Exam    General Survey    Normal Exam   Pulmonary   Normal Exam    breath sounds clear to auscultation    Mental Status   Normal Exam         ________________________________________________________________________  PLAN  ASA Score  3  Anesthetic Plan MAC       Induction (routine IV); Line ( use current access); Monitoring (standard ASA); Positioning (supine); PONV Plan (ondansetron); Pain (per surgical team); PostOp (PACU)Standard Attestation  Informed Consent     Risks:          Risks discussed were commensurate with the plan listed above with the following specific points: N/V and aspiration, Damage to:Marland Kitchen    Anesthetic Consent:         Anesthetic plan (and risks as noted above) were discussed with patient    Plan also discussed with team members including:       CRNA    Responsible Anesthesia Provider Attestation:  I attest that the patient or proxy understands and accepts the risks and  benefits of the anesthesia plan. I also attest that I have personally performed a pre-anesthetic examination and evaluation, and prescribed the anesthetic plan for this particular location within 48 hours prior to the anesthetic as documented. Rolm Bookbinder, MD  09/24/22, 8:21 AM

## 2022-09-24 NOTE — Plan of Care (Addendum)
Cath Lab / EP NP Note:     S/p atrial fibrillation cardioversion # 1 on 09/24/2022 by Doree Fudge, NP (despite 3 shocks remained on AF). Tolerated procedure well.  AF HR 90's.      Plan:  -  Continue home medications including apixaban (Eliquis) 5 mg BID, Amiodarone 200 mg twice daily and Metoprolol Succinate Toprol XL 150 mg daily.    -  Pre-existing follow up with Dr Allena Katz [facility]) on 11/28/22 at 10:30 am.     -  Send this note to [facility] to determine if wants to see pt sooner than in Nov 2024  -  Discharge home after 1 hour of post anesthesia care is completed.      Sandford Craze, NP     09/24/2022    9:25 AM    Addendum:  -  Talked to pt the importance of sleep study to determine presence of OSA.  She does have the body habitus (273 lbs) and admits being a snorer.  Most likely she has OSA just not officially diagnosed.  Will see Sleep Clinic in spring 2025.  Referral was made in May 2024 and she was told there is a 10 month wait.  Educated pt on close link between AF and untreated OSA.  Also the importance of compliance to CPAP machine when dx is confirmed.     -  Dr Allena Katz talked with pt at the bedside.  Given the fact that she's already on AAT yet still had an unsuccessful cardioversion post 3 shocks, will aim for atrial fibrillation PFA ablation in ~ November 2024.  Pt is agreeable with the plan of care.  She will be in [place] in the near future.  Will discharge after 1 hour of tele monitoring/ PACU care is completed.  Mother is at the bedside.    Sandford Craze, NP  09/24/2022  10:13 AM

## 2022-09-24 NOTE — Anesthesia Case Conclusion (Signed)
CASE CONCLUSION  Emergence  Transport  Airway:  Facemask  Oxygen Delivery:  10 lpm  Monitoring:  Pulse oximetry  Position:  Upright  Patient Condition on Handoff  Level of Consciousness:  Alert/talking/calm  Patient Condition:  Stable  Handoff Report to:  RN

## 2022-09-24 NOTE — Telephone Encounter (Signed)
Sandford Craze, NP  P Amb Card Team 20  Please schedule a 1 month follow up appointment with Dr Hermelinda Medicus post cardioversion.  Thanks.    Cruz Condon, NP  [phone]

## 2022-09-24 NOTE — Telephone Encounter (Signed)
Case booked for 11.21.24.     ----- Message -----   From: Alison Stalling, MD   Sent: 09/24/2022  10:45 AM EDT   To: Chanda Busing Tinoglio; Amb Card Ep Estate manager/land agent; *     Patient with cardioversion that failed today.     [name], Move appt to 11/6, ok to use a new slot.     Nursing, please have patient reduce amiodarone to 200mg  daily.       Procedure: PFA A-fib ablation     Estimated procedure time: 2 hours     Diagnosis: Atrial fibrillation     Date (if known): 11/21.  Patient going to [place] in December.     Referring provider: Allena Katz     3D mapping/ablation/transseptal: [facility] ablation with [employer]: as above     Anticoag hold: Hold Eliquis day of procedure     Antiarrhythmics/medications hold: none     Anesthesia type: General     Labs: CBC/BMP     Special notes/instructions: none.

## 2022-09-25 ENCOUNTER — Encounter: Payer: Self-pay | Admitting: Family

## 2022-09-27 ENCOUNTER — Encounter: Payer: Self-pay | Admitting: Thoracic Diseases

## 2022-09-27 ENCOUNTER — Encounter: Payer: Self-pay | Admitting: Gastroenterology

## 2022-09-28 ENCOUNTER — Ambulatory Visit: Payer: Medicare Other

## 2022-09-28 ENCOUNTER — Other Ambulatory Visit: Payer: Self-pay | Admitting: Thoracic Diseases

## 2022-09-28 ENCOUNTER — Encounter: Payer: Self-pay | Admitting: Thoracic Diseases

## 2022-09-28 MED ORDER — VITAMIN B-12 500 MCG PO TABS *I*
500.0000 ug | ORAL_TABLET | Freq: Every day | ORAL | 5 refills | Status: DC
Start: 2022-09-28 — End: 2023-08-28

## 2022-09-29 LAB — EKG 12-LEAD
QRS: 34 deg
QRS: 59 deg
QRSD: 81 ms
QRSD: 82 ms
QT: 380 ms
QT: 405 ms
QTc: 480 ms
QTc: 507 ms
Rate: 95 {beats}/min
Rate: 94 {beats}/min
T: -31 deg
T: 70 deg

## 2022-10-01 ENCOUNTER — Encounter: Payer: Self-pay | Admitting: Internal Medicine

## 2022-10-01 ENCOUNTER — Encounter: Payer: Self-pay | Admitting: Thoracic Diseases

## 2022-10-01 ENCOUNTER — Other Ambulatory Visit: Payer: Self-pay

## 2022-10-01 ENCOUNTER — Other Ambulatory Visit: Payer: Self-pay | Admitting: Thoracic Diseases

## 2022-10-01 ENCOUNTER — Ambulatory Visit: Payer: Self-pay | Admitting: Internal Medicine

## 2022-10-01 VITALS — BP 120/90 | HR 110 | Wt 273.0 lb

## 2022-10-01 DIAGNOSIS — F605 Obsessive-compulsive personality disorder: Secondary | ICD-10-CM

## 2022-10-01 DIAGNOSIS — I4891 Unspecified atrial fibrillation: Secondary | ICD-10-CM

## 2022-10-01 DIAGNOSIS — F419 Anxiety disorder, unspecified: Secondary | ICD-10-CM

## 2022-10-01 DIAGNOSIS — Z23 Encounter for immunization: Secondary | ICD-10-CM

## 2022-10-01 DIAGNOSIS — F4323 Adjustment disorder with mixed anxiety and depressed mood: Secondary | ICD-10-CM

## 2022-10-01 MED ORDER — PREGABALIN 100 MG PO CAPS *I*
100.0000 mg | ORAL_CAPSULE | Freq: Two times a day (BID) | ORAL | 0 refills | Status: DC
Start: 2022-10-01 — End: 2022-11-12

## 2022-10-01 NOTE — Patient Instructions (Signed)
You got your flu shot today    Continue current medications    Great to see the great improvement you had.    Be well  Carly Higgins

## 2022-10-01 NOTE — Progress Notes (Signed)
[facility]  [address]  [place], [place] [address]                  SUBJECTIVE:       Reason For Visit:   Chief Complaint   Patient presents with    Oral Medicine Consultation     Started lyrica for neuropathy in legs        Lexapro working well   Feeling less depressed   Has a plan for losing weight     Had to cancel the hip surgery due to having COVID     OCD controlled   No homocidal/suicidal ideations   Able to focus   Not too emotional   Not too irritable     Sleep is good           Review of Systems   Constitutional: Negative.    Respiratory: Negative.     Cardiovascular: Negative.    Musculoskeletal:  Positive for joint pain.   Psychiatric/Behavioral:  Negative for depression, hallucinations, memory loss, substance abuse and suicidal ideas. The patient is not nervous/anxious and does not have insomnia.        Current Outpatient Medications on File Prior to Visit   Medication Sig Dispense Refill    cyanocobalamin (VITAMIN B-12) 500 mcg tablet Take 1 tablet (500 mcg total) by mouth daily for Inadequate Vitamin B12, peripheral neuropathy. 100 tablet 5    atorvastatin (LIPITOR) 40 mg tablet TAKE 1 TABLET BY MOUTH IN THE EVENING 90 tablet 5    amiodarone (PACERONE) 400 MG tablet Take 0.5 tablets (200 mg total) by mouth 2 times daily.      pregabalin (LYRICA) 50 mg capsule Take 1 capsule (50 mg total) by mouth 2 times daily for Neuropathic Pain. Max daily dose: 100 mg 60 capsule 0    COLLAGEN PO Take by mouth daily.      escitalopram (LEXAPRO) 10 mg tablet Take 1 tablet (10 mg total) by mouth daily. 90 tablet 1    metoprolol succinate ER (TOPROL-XL) 100 mg 24 hr tablet Take 1.5 tablets (150 mg total) by mouth daily. Do not crush or chew. May be divided. 135 tablet 1    nystatin-triamcinolone (MYCOLOG II) cream Apply topically 2 times daily as needed for Skin Infection due to Candida Yeast. Apply to affected area 15 g 0    ibuprofen (ADVIL) 200 MG tablet Take 3 tablets (600 mg  total) by mouth every 6 hours as needed for Pain.      Menthol, Topical Analgesic, (ICY HOT EX) Apply 1 applicator topically as needed (pain).      Menthol, Topical Analgesic, (BIOFREEZE EX) Apply 1 applicator topically as needed (pain).      ondansetron (ZOFRAN) 4 mg tablet TAKE 1 TABLET(4 MG) BY MOUTH THREE TIMES DAILY AS NEEDED 30 tablet 0    apixaban (ELIQUIS) 5 mg tablet Take 1 tablet (5 mg total) by mouth every 12 hours. 180 tablet 3    levothyroxine (SYNTHROID, LEVOTHROID) 50 mcg tablet TAKE 1 TABLET BY MOUTH EVERY DAY 90 tablet 5    acyclovir (ZOVIRAX) 400 mg tablet TAKE 1 TABLET(400 MG) BY MOUTH TWICE DAILY 180 tablet 3     No current facility-administered medications on file prior to visit.     All history family/social/allergies/HM/PMHx/PSHx was reviewed   OBJECTIVE      Vitals:    10/01/22 1031   BP: 120/90   Pulse: 110   Weight: 123.8 kg (  273 lb)     Body mass index is 44.35 kg/m.        Physical Exam  Constitutional:       General: She is not in acute distress.     Appearance: Normal appearance. She is obese. She is not ill-appearing or toxic-appearing.   Musculoskeletal:      Comments: Hip oa - slight limp    Neurological:      General: No focal deficit present.      Mental Status: She is alert. Mental status is at baseline.   Psychiatric:         Mood and Affect: Mood normal.         Behavior: Behavior normal.         Thought Content: Thought content normal.         Judgment: Judgment normal.             A&P:       Obsessive compulsive personality disorder (Primary)  Adjustment disorder with mixed anxiety and depressed mood  Anxiety  Lexapro 10 mg a day   No changes   No desire to increase   Continue good sleep     Atrial fibrillation, unspecified type  Rate control   Anti-coagulation     Immunization due  Flu shot          Gurinder Toral G CAHN-HIDALGO, MD9/23/202410:39 AM

## 2022-10-08 ENCOUNTER — Encounter: Payer: Self-pay | Admitting: Thoracic Diseases

## 2022-10-18 ENCOUNTER — Telehealth: Payer: Self-pay | Admitting: Internal Medicine

## 2022-10-18 NOTE — Telephone Encounter (Signed)
Patient calling and reports she has discussed increasing mounjaro dose.    She states she never picked it up from pharm in the past.    Reports she discussed 5mg  dose increase when restarting?    Aware you are not in the office until Friday    [facility]

## 2022-10-19 ENCOUNTER — Other Ambulatory Visit: Payer: Self-pay | Admitting: Internal Medicine

## 2022-10-19 NOTE — Telephone Encounter (Signed)
LMOM

## 2022-10-22 MED ORDER — TIRZEPATIDE 5 MG/0.5ML SC SOAJ *I*
5.0000 mg | SUBCUTANEOUS | 6 refills | Status: DC
Start: 2022-10-22 — End: 2023-01-26

## 2022-10-22 NOTE — Telephone Encounter (Signed)
Notified pt.

## 2022-10-23 ENCOUNTER — Other Ambulatory Visit: Payer: Self-pay | Admitting: Internal Medicine

## 2022-10-24 ENCOUNTER — Other Ambulatory Visit: Payer: Self-pay | Admitting: Internal Medicine

## 2022-10-24 DIAGNOSIS — I4891 Unspecified atrial fibrillation: Secondary | ICD-10-CM

## 2022-10-24 NOTE — Progress Notes (Signed)
[facility] Chart Review Triage    62 y.o. female with Primary osteoarthritis of right hip [M16.11] presenting for Procedure(s) (LRB):  RIGHT POSTERIOR ARTHROPLASTY, HIP, TOTAL (Right) by Surgeon(s):  Fransisca Connors, MD scheduled on 13 Dec 24      Lab Result Adequate for Surgery Suboptimal for Surgery No/Lacking information Additional consideration   HgB     Lab results: 09/18/22  0859   Hemoglobin 12.5      [x]          []  []     Hemoglobin A1c Lab Results   Component Value Date    HA1C 6.6 (H) 08/08/2022    [x]          []  []     Creatinine     Lab results: 09/18/22  0859   Creatinine 0.92      [x]    []  []     Thyroid Function     Lab results: 06/05/22  1214   TSH 4.02       Free T4   Date Value Ref Range Status   06/07/2016 1.0 0.9 - 1.7 ng/dL Final      [x]   []  []       Vital Signs Result Adequate for Surgery Suboptimal for Surgery No/Lacking information Additional consideration   Last 3 Blood Pressures on file BP Readings from Last 3 Encounters:   10/01/22 120/90   09/24/22 117/67   09/18/22 113/71    [x]          []  []     Last 3 pulses on file Pulse Readings from Last 3 Encounters:   10/01/22 110   09/24/22 92   09/18/22 91    [x]          []  []     Last BMI calculations on file BMI Readings from Last 3 Encounters:   10/01/22 44.35 kg/m   09/24/22 44.41 kg/m   09/18/22 43.51 kg/m    [x]          []  []       Social History Adequate for Surgery Suboptimal for Surgery No/Lacking information Additional consideration   Social Drivers of Health     Tobacco Use: Medium Risk (10/01/2022)    Patient History     Smoking Tobacco Use: Former     Smokeless Tobacco Use: Never     Passive Exposure: Past   Alcohol Use: Not on file   Financial Resource Strain: Not on file   Food Insecurity: No Food Insecurity (10/20/2021)    Hunger Vital Sign     Worried About Running Out of Food in the Last Year: Never true     Ran Out of Food in the Last Year: Never true   Transportation Needs: No Transportation Needs  (10/20/2021)    PRAPARE - Therapist, art (Medical): No     Lack of Transportation (Non-Medical): No   Physical Activity: Not on file   Stress: Not on file   Social Connections: Not on file   Depression: At risk (06/08/2022)    Depression     Last PHQ9 (Date Compare): 15   Housing Stability: Low Risk  (10/20/2021)    Housing Stability Vital Sign     Unable to Pay for Housing in the Last Year: No     Number of Places Lived in the Last Year: 1     Unstable Housing in the Last Year: No   Utilities: Not on file             [  x] []  []       Future Appointments          Nov 14 2022   09:30 AM - FOLLOW UP VISIT  [facility] - Parag Leodis Binet, MD           Nov 19 2022   10:30 AM - PORT DRAW  [facility] - [facility], [facility]           Nov 19 2022   11:00 AM - FOLLOW UP VISIT  [facility] - Alison Murray, MD           Nov 20 2022   01:00 PM - FOLLOW UP VISIT  [facility] at [facility] - Joline Maxcy, MD           Nov 28 2022   10:30 AM - FOLLOW UP VISIT  [facility] - CARD EP PINK              Displaying the next 5 appointments. This patient has additional appointments scheduled.            Recent Visits  Date Type Provider Dept   10/01/22 Office Visit Volanda Napoleon, MD [facility]   06/11/22 Office Visit [place], Evette Georges, MD [facility]   06/08/22 Office Visit [place], [place], MD [facility]   Showing recent visits within past 185 days in an active department and meeting all other requirements  Future Appointments  No visits were found meeting these conditions.  Showing future appointments within next 0 days in an active department and meeting all other requirements       Concern for Proceeding with Arthroplasty Given Issue Identified in Chart Review No Concerns Identified Concerns Identified Additional consideration   Inadequate  social support for recovery []  []     Recent hospitalization []  []     Recent medication changes []  []     Lab abnormalities as specified []  []     Vital sign abnormalities as specified []  []     Pending subspecialty evaluation for active condition or active symptoms []  []       Recommendations     Fast track candidate []     History and Physical at [facility] [x]  Aim to schedule:    PCP opinion is required to proceed with surgery [x]  PCP visit already scheduled prior to surgery   Subspecialty visit is required to proceed with surgery [x]  Cardiology appointment already scheduled prior to surgery   [facility] consultation could be beneficial in assisting in perioperative optimization []     No further H&P required in CPM or PCP - can be completed on the DOS  []

## 2022-10-26 ENCOUNTER — Telehealth: Payer: Self-pay

## 2022-10-31 ENCOUNTER — Ambulatory Visit: Payer: Self-pay

## 2022-10-31 NOTE — Progress Notes (Signed)
ANTICOAGULATION PLAN APPROVED BY PRESCRIBING PHYSICIAN     ANTICOAGULATION PLAN FOR SURGERY    DATE OF SURGERY:  12/4    SURGEON: Dr. Arlyce Dice    ANTICOAGULANT: Everlene Balls (APIXABAN)    HOLD TIME AND PLAN: Hold 3 days prior to surgery    MANAGING/APPROVING PHYSICIAN: Dr Allena Katz via [facility] 10/23- Appt 11/6; Dr Allena Katz approved via [facility] 11/6

## 2022-11-01 ENCOUNTER — Telehealth: Payer: Self-pay

## 2022-11-01 DIAGNOSIS — Z01812 Encounter for preprocedural laboratory examination: Secondary | ICD-10-CM

## 2022-11-01 NOTE — Telephone Encounter (Signed)
Spoke with [name]. Instructions for an afib ablation on 11/29/22 with Dr. Allena Katz were reviewed.     Instructions (see letters tab) have been placed in the mail.     Last dose of Mounjaro 7 days prior.  Last dose of Eliquis on 11/20 pm - holding 1 dose.     Lab work to be done with [facility] labs.      Teaching completed and she expressed good understanding of the information. She was encouraged to contact the EP office with any questions or concerns, and she agreed with this plan.

## 2022-11-05 ENCOUNTER — Other Ambulatory Visit: Payer: Self-pay | Admitting: Hematology and Oncology

## 2022-11-05 ENCOUNTER — Encounter: Payer: Self-pay | Admitting: Cardiology

## 2022-11-07 ENCOUNTER — Telehealth: Payer: Self-pay | Admitting: Hematology and Oncology

## 2022-11-07 ENCOUNTER — Telehealth: Payer: Self-pay | Admitting: Cardiology

## 2022-11-07 NOTE — Telephone Encounter (Signed)
Pt calling r/t 11/11 appts, says her port has been removed but she will be at 11a appt. Verified call back number

## 2022-11-07 NOTE — Telephone Encounter (Signed)
Spoke with patient to advise her that Dr. Allena Katz is okay with her proceeding for hip surgery and cancelling the ablation. He will follow up with her on 11/6. Patient expressed understanding and agreeable to plan. All questions addressed.     Rona Ravens, RN

## 2022-11-07 NOTE — Telephone Encounter (Addendum)
Alison Stalling, MD  Amb Card Ep Rn; [place], [place] Comment, [place] minutes ago (11:02 AM)       Carly Higgins.  She can proceed with hip surgery we will cancel ablation as long as she is stable from a cardiac standpoint.  I will see her on November 6 for final decision and operative recommendations

## 2022-11-07 NOTE — Telephone Encounter (Signed)
Pt is calling to state that she canceled her ablation because she needs Hip Surgery and was told that if she get the ablation, Then she cannot have the hip surgery for 2 months and she cannot walk. Pt wants to let Dr. Allena Katz know because Dr. Allena Katz has to approve the surgery. Please advise

## 2022-11-10 ENCOUNTER — Other Ambulatory Visit: Payer: Self-pay | Admitting: Thoracic Diseases

## 2022-11-10 ENCOUNTER — Encounter: Payer: Self-pay | Admitting: Internal Medicine

## 2022-11-11 NOTE — Progress Notes (Addendum)
[facility]        Electrophysiology Office Revisit Note    Date of Visit: 11/14/2022 Patient: Carly Higgins   Patients PCP: Volanda Napoleon, MD Patient DOB: 17-Mar-1960  EMRN: [id number]     Subjective/Reason For Visit     I had the pleasure of seeing Carly Higgins in cardiology followup on 11/14/2022. She has a history of palpitations correlating to paroxysmal atrial fibrillation and flutter which was confirmed with Holter monitoring. When she is in atrial fibrillation her heart rate has been rapid despite use of metoprolol.   When she was seen in August, amiodarone was initiated and metoprolol changed to succinate.     She's been troubled by excessive sweating for a couple years which has been evaluated by her PCP & oncologist.  She was having this in July when she wore a monitor and did not have any significant bradycardia at that time.     At her last visit she was referred to sleep medicine for consultation given symptoms suggestive of sleep apnea. She has an appointment for when she returns from [place].    She was cardioverted on 9/16 but had early return of atrial fibrillation.  She was discharged on apixaban (Eliquis) 5 mg BID, Amiodarone 200 mg twice daily and Metoprolol Succinate Toprol XL 150 mg daily.   She was scheduled for AF ablation this month, but wants to have her hip surgery in Dec, for which she would need to hold eliquis, which is too early post ablation, so she elected to postpone AF ablation.    She has palpitations but denies symptoms of chest discomfort and presyncope/syncope.  She has some dyspnea with wearing a mask but otherwise hasn't noticed.  She's made several dietary changes and is starting to lose some weight.    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    Conditioned w/ Busulfan 130 mg/m2/d and Fludarabine 40 mg/m2/d x4 days followed by 10/10 HLA MUD (F) Allogeneic PBSCT (pt/donor: ABO: A+/A+, CMV N/N)    Pulmonary edema     s/p L THA 10/20/21 10/20/2021    Shortness of breath     Tobacco use      Past Surgical History:   Procedure Laterality Date    BONE MARROW TRANSPLANT  2017    Mediport insertion      PR ARTHRP ACETBLR/PROX FEM PROSTC AGRFT/ALGRFT Left 10/20/2021    Procedure: LEFT POSTERIOR ARTHROPLASTY, HIP, TOTAL;  Surgeon: Fransisca Connors, MD;  Location: [facility];  Service: Orthopedics    PR AXILLARY LYMPHADENECTOMY COMPLETE Left 01/24/2021    Procedure: sentinel lymph node biopsy;  Surgeon: Sheliah Mends, MD;  Location: [facility];  Service: Oncology General    PR CARDIOVERSION ELECTIVE ARRHYTHMIA EXTERNAL N/A 09/24/2022    Procedure: Cardioversion;  Surgeon: Katherine Roan, NP;  Location: [facility]S;  Service: Cardiovascular    PR MASTECTOMY PARTIAL  Left 01/24/2021    Procedure: LEFT NEEDLE LOC PARTIAL MASTECTOMY   ;  Surgeon: Sheliah Mends, MD;  Location: [facility];  Service: Oncology General    ROTATOR CUFF REPAIR Right      Review of Systems   Constitutional:  Negative for malaise/fatigue.   Respiratory:  Negative for shortness of breath.    Cardiovascular:  Positive for palpitations. Negative for chest pain.   Neurological:  Negative for loss of consciousness.     Medications     Current Outpatient Medications   Medication Sig    pregabalin (LYRICA) 100 mg capsule TAKE 1 CAPSULE(100 MG) BY MOUTH TWICE DAILY FOR NERVE PAIN. MAX DAILY DOSE: 200 MG    acyclovir (ZOVIRAX) 400 mg tablet TAKE 1 TABLET(400 MG) BY MOUTH TWICE DAILY    tirzepatide (MOUNJARO) 5 MG/0.5ML pen Inject 0.5 mLs (5 mg  total) into the skin every 7 days.    levothyroxine (SYNTHROID, LEVOTHROID) 50 mcg tablet TAKE 1 TABLET BY MOUTH EVERY DAY    cyanocobalamin (VITAMIN B-12) 500 mcg tablet Take 1 tablet (500 mcg total) by mouth daily for Inadequate Vitamin B12, peripheral neuropathy.    atorvastatin (LIPITOR) 40 mg tablet TAKE 1 TABLET BY MOUTH IN THE EVENING    COLLAGEN PO Take by mouth daily.    escitalopram (LEXAPRO) 10 mg tablet Take 1 tablet (10 mg total) by mouth daily.    apixaban (ELIQUIS) 5 mg tablet Take 1 tablet (5 mg total) by mouth every 12 hours.    metoprolol succinate ER (TOPROL-XL) 200 mg 24 hr tablet Take 1 tablet (200 mg total) by mouth daily. Do not crush or chew. May be divided.    nystatin-triamcinolone (MYCOLOG II) cream Apply topically 2 times daily as needed for Skin Infection due to Candida Yeast. Apply to affected area    ibuprofen (ADVIL) 200 MG tablet Take 3 tablets (600 mg total) by mouth every 6 hours as needed for Pain.    Menthol, Topical Analgesic, (ICY HOT EX) Apply 1 applicator topically as needed (pain).    Menthol, Topical Analgesic, (BIOFREEZE EX) Apply 1 applicator topically as needed (pain).    ondansetron (ZOFRAN) 4 mg tablet TAKE 1 TABLET(4 MG) BY MOUTH THREE TIMES DAILY AS NEEDED     Vitals and Physical Exam     Carly Higgins's  height is 1.651 m (5\' 5" ) and weight is 118.4 kg (261 lb). Her blood pressure is 105/71 and her pulse is 86. Her oxygen saturation is 97%.  Body mass index is 43.43 kg/m.    Physical Exam  Vitals reviewed.   Constitutional:       General: She is not in acute distress.     Appearance: Normal appearance. She is obese.   HENT:      Head: Normocephalic and atraumatic.   Cardiovascular:      Rate and Rhythm: Normal rate. Rhythm irregular.      Heart sounds: No murmur heard.     No friction rub. No gallop.   Pulmonary:      Effort: Pulmonary effort is normal.      Breath sounds: No wheezing, rhonchi or rales.   Musculoskeletal:      Right lower leg: No edema.      Left lower  leg: No edema.   Skin:     General: Skin is warm and dry.   Neurological:      General: No focal deficit present.      Mental Status: She is alert.  Psychiatric:         Mood and Affect: Mood normal.       Laboratory Data     Hematology:   Results in Past 730 Days  Result Component Current Result Previous Result   WBC 5.9 (11/13/2022) 5.4 (09/18/2022)   Hemoglobin 13.9 (11/13/2022) 12.5 (09/18/2022)   Hematocrit 45 (11/13/2022) 40 (09/18/2022)   Platelets 202 (11/13/2022) 241 (09/18/2022)     Chemistry:   Results in Past 730 Days  Result Component Current Result Previous Result   Sodium 138 (11/13/2022) 138 (09/18/2022)   Potassium 4.5 (11/13/2022) 4.8 (09/18/2022)   Creatinine 0.99 (H) (11/13/2022) 0.92 (09/18/2022)   Glucose 113 (H) (11/13/2022) 203 (H) (09/18/2022)   Calcium 10.3 (H) (11/13/2022) 9.3 (09/18/2022)   Magnesium 1.9 (09/18/2022) Not in Time Range   Hemoglobin A1C 6.6 (H) (11/13/2022) 6.6 (H) (08/08/2022)   AST 30 (11/13/2022) 21 (09/18/2022)   ALT 25 (11/13/2022) 19 (09/18/2022)   TSH 19.60 (H) (11/13/2022) 4.02 (06/05/2022)     Coagulation Studies:   Results in Past 730 Days  Result Component Current Result Previous Result   aPTT 32.9 (08/08/2022) 32.5 (10/11/2021)   INR 1.2 (H) (08/08/2022) 0.9 (10/11/2021)     Cardiac:   No results found for requested labs within last 730 days.     Lipids:   Results in Past 730 Days  Result Component Current Result Previous Result   Cholesterol 113 (11/13/2022) 192 (12/08/2020)   HDL 60 (11/13/2022) 39 (L) (12/08/2020)   Triglycerides 75 (11/13/2022) 170 (!) (12/08/2020)   LDL Calculated 38 (11/13/2022) 119 (12/08/2020)   Chol/HDL Ratio 1.9 (11/13/2022) 4.9 (12/08/2020)     Cardiac/Imaging Data & Risk Scores     ECG: AF with ventricular rate 96 bpm         ECHO COMPLETE 07/27/2022    Interpretation Summary    Patient is in atrial fibrillation with a fast ventricular rate of 110-130.    There is concentric hypertrophy. The left ventricular ejection fraction is mildly decreased. The calculated LVEF is 43%  globally reduced.    Left atrial enlargement.    There is mild-to-moderate mitral regurgitation.    There is mild tricuspid regurgitation.    The size of the ascending aorta is borderline enlarged. Aortic Diameters: sinus = 2.8 cm, tubular = 3.7 cm, arch = 2.8 cm.    Compared to prior echocardiogram her overall LV function is more globally reduced to 43% and was 65%.  There is noted mitral regurgitation as well as tricuspid agitation.  Patient is in a fast atrial fibrillation.           CARDIAC CATHETERIZATION 09/24/2022    Narrative  S/p successful cardioversion. Pt was shocked with 200 J and 360 J x 2 with conversion of NSR for a few second with early return of atrial fibrillation.           EPATCH 14-DAY MONITOR 07/19/2022    Narrative  Patient monitored for 14d, analyzable time was 11d 6h starting on 06/20/2022 04:00 pm.  Primary rhythm was Atrial Fibrillation / Flutter. Average heart rate was 99 bpm, Minimum heart rate was 59 bpm on Day 13  / 08:22:54 am, Max heart rate was 196 bpm on Day 2 / 07:26:20 pm  Atrial Fibrillation or Flutter: Burden was 54.95 %, longest event 3d 23h on Day 1 / 04:05:20 pm, fastest event 196 bpm on  Day 1 / 04:05:20 pm  SVE(s): Burden was 0.59 %, 9375 total SVE(s)  SV Arrhythmia(s): 46  event(s), longest event 23 beats on Day 6 / 12:42:21 am, fastest event 141 bpm on Day 8 / 10:31:51  am  PVC(s): Burden was 0.03 %, 430 total PVC(s), 3 disparate morphologies  Ventricular Arrhythmia(s): 1 event(s), longest event 4 beats at Day 15 / 10:25:52 am, fastest event 185 bpm at Day 15 /  10:25:52 am  Patient recorded 4 event(s) during the monitoring period  Cardioversion not required as patient paroxysmal.          CHA2DS-VASC Risk Scores   CHA2DS2-VASC Score: 3 with a risk of ischemic stroke: 3.2%; stroke/TIA/embolism: 4.6%         Impression and Plan     This is an 62 y.o. female whom we follow for paroxysmal atrial fibrillation and flutter.     Atrial fibrillation and flutter  - diagnosed in  June '24 when she presented to an OV with palpitations and ECG confirmed; anticoagulation initiated for CHADS2vasc 3 (apixaban)  - managed with toprol and amiodarone  - amiodarone increased in Aug due to persistent AF and then decreased back to 200 mg twice daily   - discussed long term option for atrial fibrillation ablation; she winters in [facility] so if she is interested in pursuing this would be after her return from [facility] spring '25  - s/p DCCV 9/16 with which she had early return to AF  -  ECG today continues to show AF, rate is fairly well controlled.    - We have asked her to stop amiodarone, stop metoprolol tartrate and increase metoprolol succinate to 200 mg daily  - follow up in 6 months     We appreciate the opportunity to participate in the care of your patient. If you have any questions or concerns please feel free to contact our office.    Carly Marlou Starks, NP  Electronically signed on 11/14/2022 at 10:07 AM.    Attending Addendum:  I saw and evaluated the patient, and I provided the substantive portion the medical decision making. I made  the management plan. I take responsibility for the plan and the patient's management. The medical decision making was personally performed by me and any additions to our assessment and care plan are noted below:    Overall Carly Higgins is doing okay.  She continues with persistent atrial fibrillation despite therapy with amiodarone and previous cardioversion.  We talked about catheter ablation and she was initially thinking about proceeding with this first.  However her hip pain unfortunately is becoming very debilitating for her and she is scheduled for hip replacement surgery in December.  She would like to proceed with that first.  She is been rate controlled on metoprolol.  At this point we will stop her amiodarone and increase Toprol-XL to 200 mg a day.  She has had no significant heart failure and this was likely related to rapid ventricular response.  Heart rate control  is better now.  She understands she is at moderate risk for a cardiac event during surgery but at this point it is not prohibitive as she would like to have this done first.  She would consider catheter ablation for atrial fibrillation earlier next year.    She needs no further cardiac testing at this time as she reports no angina or heart failure symptoms.  Rate control rate control if needed can be done with intravenous Cardizem or metoprolol during her procedure.  She can hold Eliquis for 2-3 days prior to her procedure and resume it thereafter.  She is planned to go to [place] for the winter.  We will see her back in the springtime.    Akilah Cureton P. Allena Katz, MD 10:28 AM 11/14/2022

## 2022-11-13 ENCOUNTER — Other Ambulatory Visit
Admission: RE | Admit: 2022-11-13 | Discharge: 2022-11-13 | Disposition: A | Discharge status: Home / Self Care | Payer: Medicare Other | Source: Ambulatory Visit | Attending: Internal Medicine | Admitting: Internal Medicine

## 2022-11-13 ENCOUNTER — Other Ambulatory Visit: Payer: Self-pay | Admitting: Gastroenterology

## 2022-11-13 DIAGNOSIS — Z0000 Encounter for general adult medical examination without abnormal findings: Secondary | ICD-10-CM | POA: Insufficient documentation

## 2022-11-13 DIAGNOSIS — G620 Drug-induced polyneuropathy: Secondary | ICD-10-CM | POA: Insufficient documentation

## 2022-11-13 DIAGNOSIS — E039 Hypothyroidism, unspecified: Secondary | ICD-10-CM | POA: Insufficient documentation

## 2022-11-13 DIAGNOSIS — E1140 Type 2 diabetes mellitus with diabetic neuropathy, unspecified: Secondary | ICD-10-CM | POA: Insufficient documentation

## 2022-11-13 DIAGNOSIS — Z171 Estrogen receptor negative status [ER-]: Secondary | ICD-10-CM | POA: Insufficient documentation

## 2022-11-13 DIAGNOSIS — C50412 Malignant neoplasm of upper-outer quadrant of left female breast: Secondary | ICD-10-CM | POA: Insufficient documentation

## 2022-11-13 DIAGNOSIS — Z01812 Encounter for preprocedural laboratory examination: Secondary | ICD-10-CM | POA: Insufficient documentation

## 2022-11-13 DIAGNOSIS — T451X5A Adverse effect of antineoplastic and immunosuppressive drugs, initial encounter: Secondary | ICD-10-CM | POA: Insufficient documentation

## 2022-11-13 LAB — DIFF MANUAL
Bands %: 1 % (ref 0–10)
Diff Based On: 114 {cells}
React Lymph %: 1 % (ref 0–6)

## 2022-11-13 LAB — COMPREHENSIVE METABOLIC PANEL
ALT: 25 U/L (ref 0–35)
AST: 30 U/L (ref 0–35)
Albumin: 4.4 g/dL (ref 3.5–5.2)
Alk Phos: 86 U/L (ref 35–105)
Anion Gap: 11 (ref 7–16)
Bilirubin,Total: 0.7 mg/dL (ref 0.0–1.2)
CO2: 28 mmol/L (ref 20–28)
Calcium: 10.3 mg/dL — ABNORMAL HIGH (ref 8.6–10.2)
Chloride: 99 mmol/L (ref 96–108)
Creatinine: 0.99 mg/dL — ABNORMAL HIGH (ref 0.51–0.95)
Glucose: 113 mg/dL — ABNORMAL HIGH (ref 60–99)
Lab: 17 mg/dL (ref 6–20)
Potassium: 4.5 mmol/L (ref 3.3–5.1)
Sodium: 138 mmol/L (ref 133–145)
Total Protein: 6.6 g/dL (ref 6.3–7.7)
eGFR BY CREAT: 64 *

## 2022-11-13 LAB — CBC AND DIFFERENTIAL
Baso # K/uL: 0.1 10*3/uL (ref 0.0–0.2)
Eos # K/uL: 0.4 10*3/uL (ref 0.0–0.5)
Hematocrit: 45 % (ref 34–49)
Hemoglobin: 13.9 g/dL (ref 11.2–16.0)
Lymph # K/uL: 1.7 10*3/uL (ref 1.0–5.0)
MCV: 93 fL (ref 75–100)
Mono # K/uL: 0.4 10*3/uL (ref 0.1–1.0)
Neut # K/uL: 3.4 10*3/uL (ref 1.5–6.5)
Nucl RBC # K/uL: 0 10*3/uL (ref 0.0–0.0)
Nucl RBC %: 0 /100{WBCs} (ref 0.0–0.2)
Platelets: 202 10*3/uL (ref 150–450)
RBC: 4.9 MIL/uL (ref 4.0–5.5)
RDW: 16.3 % — ABNORMAL HIGH (ref 0.0–15.0)
Seg Neut %: 56.1 %
WBC: 5.9 10*3/uL (ref 3.5–11.0)

## 2022-11-13 LAB — LIPID PANEL
Chol/HDL Ratio: 1.9
Cholesterol: 113 mg/dL
HDL: 60 mg/dL (ref 40–60)
LDL Calculated: 38 mg/dL
Non HDL Cholesterol: 53 mg/dL
Triglycerides: 75 mg/dL

## 2022-11-13 LAB — HEMOGLOBIN A1C: Hemoglobin A1C: 6.6 % — ABNORMAL HIGH

## 2022-11-13 LAB — VITAMIN B12: Vitamin B12: 654 pg/mL (ref 232–1245)

## 2022-11-13 LAB — MULTIPLE ORDERING DOCS

## 2022-11-13 LAB — NEUTROPHIL #-INSTRUMENT: Neutrophil #-Instrument: 3.5 10*3/uL

## 2022-11-13 LAB — UNABLE TO VOID

## 2022-11-13 LAB — TSH: TSH: 19.6 u[IU]/mL — ABNORMAL HIGH (ref 0.27–4.20)

## 2022-11-14 ENCOUNTER — Ambulatory Visit: Payer: Medicare Other | Attending: Cardiology | Admitting: Cardiology

## 2022-11-14 ENCOUNTER — Other Ambulatory Visit: Payer: Self-pay

## 2022-11-14 ENCOUNTER — Telehealth: Payer: Self-pay | Admitting: Internal Medicine

## 2022-11-14 VITALS — BP 105/71 | HR 86 | Ht 65.0 in | Wt 261.0 lb

## 2022-11-14 DIAGNOSIS — R9431 Abnormal electrocardiogram [ECG] [EKG]: Secondary | ICD-10-CM | POA: Insufficient documentation

## 2022-11-14 DIAGNOSIS — I4891 Unspecified atrial fibrillation: Secondary | ICD-10-CM | POA: Insufficient documentation

## 2022-11-14 DIAGNOSIS — Z9481 Bone marrow transplant status: Secondary | ICD-10-CM

## 2022-11-14 DIAGNOSIS — R110 Nausea: Secondary | ICD-10-CM

## 2022-11-14 DIAGNOSIS — I4819 Other persistent atrial fibrillation: Secondary | ICD-10-CM | POA: Insufficient documentation

## 2022-11-14 LAB — EKG 12-LEAD
QRS: -8 deg
QRSD: 85 ms
QT: 370 ms
QTc: 469 ms
Rate: 96 {beats}/min
T: -55 deg

## 2022-11-14 MED ORDER — METOPROLOL SUCCINATE 200 MG PO TB24 *I*
200.0000 mg | ORAL_TABLET | Freq: Every day | ORAL | 3 refills | Status: DC
Start: 2022-11-14 — End: 2023-05-31

## 2022-11-14 MED ORDER — ONDANSETRON 4 MG PO TBDP *I*
4.0000 mg | ORAL_TABLET | Freq: Three times a day (TID) | ORAL | 0 refills | Status: DC | PRN
Start: 2022-11-14 — End: 2022-12-15

## 2022-11-14 NOTE — Telephone Encounter (Signed)
Ondansetron request

## 2022-11-14 NOTE — Patient Instructions (Addendum)
You were seen today for follow up of atrial fibrillation  We're making some medication changes:  STOP amiodarone  STOP metoprolol tartrate  INCREASE metoprolol succinate to 200 mg daily     Carly PETRINI ROMEO, NP  [phone]

## 2022-11-14 NOTE — Result Encounter Note (Signed)
Spoke to pt- gave this message.

## 2022-11-15 ENCOUNTER — Other Ambulatory Visit
Admission: RE | Admit: 2022-11-15 | Discharge: 2022-11-15 | Disposition: A | Discharge status: Home / Self Care | Payer: Medicare Other | Source: Ambulatory Visit | Attending: Internal Medicine | Admitting: Internal Medicine

## 2022-11-15 DIAGNOSIS — Z0000 Encounter for general adult medical examination without abnormal findings: Secondary | ICD-10-CM | POA: Insufficient documentation

## 2022-11-15 DIAGNOSIS — E1140 Type 2 diabetes mellitus with diabetic neuropathy, unspecified: Secondary | ICD-10-CM | POA: Insufficient documentation

## 2022-11-15 DIAGNOSIS — E039 Hypothyroidism, unspecified: Secondary | ICD-10-CM | POA: Insufficient documentation

## 2022-11-15 LAB — URINALYSIS WITH REFLEX TO MICROSCOPIC
Blood,UA: NEGATIVE
Glucose,UA: NEGATIVE
Ketones, UA: NEGATIVE
Leuk Esterase,UA: NEGATIVE
Nitrite,UA: NEGATIVE
Protein,UA: NEGATIVE
Specific Gravity,UA: 1.03 (ref 1.002–1.030)
pH,UA: 5.5 (ref 5.0–8.0)

## 2022-11-15 LAB — MICROALBUMIN, URINE, RANDOM
Creatinine,UR: 181 mg/dL (ref 20–300)
Microalbumin,UR: <1.2 mg/dL

## 2022-11-16 ENCOUNTER — Telehealth: Payer: Self-pay | Admitting: Cardiology

## 2022-11-16 NOTE — Telephone Encounter (Signed)
Patient Name: Carly Higgins     Date of Original Appt: 11/12    Reason for Cancel: conflict     Reschedule Date/Provider's Name: Will call back to reschedule

## 2022-11-19 ENCOUNTER — Other Ambulatory Visit: Payer: Self-pay

## 2022-11-19 ENCOUNTER — Ambulatory Visit: Payer: Medicare Other | Attending: Hematology and Oncology | Admitting: Hematology and Oncology

## 2022-11-19 ENCOUNTER — Ambulatory Visit: Payer: Medicare Other

## 2022-11-19 VITALS — BP 100/67 | HR 68 | Temp 95.5°F | Wt 263.2 lb

## 2022-11-19 DIAGNOSIS — C50412 Malignant neoplasm of upper-outer quadrant of left female breast: Secondary | ICD-10-CM | POA: Insufficient documentation

## 2022-11-19 DIAGNOSIS — G620 Drug-induced polyneuropathy: Secondary | ICD-10-CM | POA: Insufficient documentation

## 2022-11-19 DIAGNOSIS — T451X5A Adverse effect of antineoplastic and immunosuppressive drugs, initial encounter: Secondary | ICD-10-CM | POA: Insufficient documentation

## 2022-11-19 DIAGNOSIS — Z171 Estrogen receptor negative status [ER-]: Secondary | ICD-10-CM | POA: Insufficient documentation

## 2022-11-19 NOTE — Progress Notes (Signed)
ECOG Performance 0    ROS:  Appetite normal yes  Weight change yes - down 2.2 kg  Fever   no  Night sweats  no  Fatigue  yes - mild  Bleeding   no  Cough    no  Dyspnea  no  Chest pain  no  Palpitations  no  Nausea  yes - r/t medications  Vomiting  yes - r/t medications  Dysphagia  no  Mucositis  no  Diarrhea  no  Constipation  no  Abdominal pain yes - mild r/t meds  Urinary symptoms no  Back pain  no  Joint pain  yes - Chronic (R) hip pain.  Surgery scheduled for 12/21/22  Bone pain  no  Leg/arm swelling no  Headache  no  Mental status change no  Motor weakness no  Sensory change yes - Constant numbness, tingling, and burning from balls of feet through toes.    Gait disturbance yes - Occasional r/t hip pain  Rash    no    LMP:  MMG:  GYN:  DEXA:  COLONOSCOPY:

## 2022-11-19 NOTE — Progress Notes (Signed)
Medical Oncology   [facility]     FOLLOW-UP VISIT    PATIENT NAME:  Carly Higgins, Carly Higgins    DOB:  Nov 15, 1960   MRN:  [id number]  11/19/2022     DIAGNOSIS:  Clinical stage IIB left breast cancer    REASON FOR VISIT: Ilyse Weilert is seen today for follow-up of clinical stage IIB (cT2, cN0, cM0) grade 3, ER negative, PR negative, HER2 negative infiltrating ductal carcinoma of the left breast.      in anticipation of starting cycle 1 of doxorubicin and cyclophosphamide as neoadjuvant chemotherapy for   She has now completed 4 cycles of weekly paclitaxel and carboplatin.    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  In February 2022 she began to experience discomfort in the left breast.  She first attributed this to having been struck with a part of her dog when she picked it up about 1 week earlier.  She continued to experience discomfort intermittently.  She was in [place] at the time and did not seek any evaluation.       When she returned to [place] she scheduled a screening mammogram, having not had an exam since July 2018.  This was performed on May 18, 2020 and revealed an area of architectural distortion in the middle third of the left breast upper outer quadrant at the 2 o'clock position.     On Jun 07, 2020 diagnostic mammogram and breast ultrasound was performed.  The mammogram revealed scattered areas of fibroglandular densities with an area of architectural distortion again noted at the 2 o'clock position of the left breast.  Breast ultrasound revealed an irregular mass measuring 19 x 19 x 12 mm in the middle third upper outer quadrant of the left breast.  Internal echotexture was hypoechoic and there was internal vascularity.       Based on these findings, ultrasound-guided core biopsy of the left breast 2:00 lesion was performed on June 09, 2020.  Biopsy revealed invasive ductal carcinoma.  Provisional  combined Nottingham grade was 3 of 3 (tubule formation score 3, nuclear grade 3, mitotic rate score 2).  Tumor cells were negative for estrogen receptors and progesterone receptors.  Tumor cells were negative for HER2 by immunohistochemical staining with a staining score of 1+.  Ki-67 staining was seen in approximately 70% of tumor cells.     She was seen by Dr. Claris Gower for surgical consultation at which time she was noted to have a masslike area at the 2 o'clock position of the left breast which felt like a hematoma.  Staging scans were requested.     She was seen in the Hereditary Oncology clinic on June 13, 2020. Because of her prior allogeneic bone marrow transplant, she was not eligible for standard germline testing with blood or saliva specimen.       Whole-body bone scan performed on June 20, 2020 revealed no abnormal focus of radiotracer uptake to suggest osseous metastatic disease.     CT of the chest performed with contrast on June 22, 2020 revealed an ill-defined enhancing lesion in the lateral left breast measuring 1.7 x 1.2 x 1.1 cm.  There were asymmetrically prominent level 1 left axillary lymph nodes that demonstrated enhancement of uncertain significance.  These were felt possibly to represent metastatic disease.  There were no enlarged mediastinal, hilar, intramammary or supraclavicular lymph nodes.  No suspicious pulmonary nodules were identified.  CT  of the abdomen pelvis performed at the same time revealed no evidence of metastatic disease within the abdomen and pelvis.  A small fat filled umbilical hernia was noted.    MRI of the breast performed on June 28, 2020 revealed an irregular mass with spiculated margins measuring 10 x 25 x 19 mm in the left breast upper outer quadrant at the 2 o'clock position located 3 cm from the nipple.    Neoadjuvant chemotherapy was recommended and she began cycle 1 of weekly paclitaxel and carboplatin on July 15, 2020.  Immunotherapy with pembrolizumab was  not recommended due to her previous allogenic bone marrow transplant.    On August 18, 2020 she underwent placement of PowerPort in the right internal jugular vein.    INTERVAL HISTORY:           She reports that she has been smoking several cigarettes a day.    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  Prior to bone marrow transplant she received 5 cycles of decitabine.  She received fludarabine, busulfan and and hydroxyurea for the transplant and received methotrexate for graft-versus-host prophylaxis.  Hypothyroid.  Anxiety.  Osteoarthritis of the hips, left greater than right.  Mild diverticulosis of the sigmoid colon.  History of atrial fibrillation and congestive heart failure in 2017 related to the bone marrow transplant.  Right cataract surgery.  Right rotator cuff surgery.    MEDICATIONS:     Current Outpatient Medications   Medication Sig    metoprolol succinate ER (TOPROL-XL) 200 mg 24 hr tablet Take 1 tablet (200 mg total) by mouth daily. Do not crush or chew. May be divided.    ondansetron (ZOFRAN-ODT) 4 MG disintegrating tablet Take 1 tablet (4 mg total) by mouth 3 times daily as needed. Place on top of tongue.    pregabalin (LYRICA) 100 mg capsule TAKE 1 CAPSULE(100 MG) BY MOUTH TWICE DAILY FOR NERVE PAIN. MAX DAILY DOSE: 200 MG    acyclovir (ZOVIRAX) 400 mg tablet TAKE 1 TABLET(400 MG) BY MOUTH TWICE DAILY    tirzepatide (MOUNJARO) 5 MG/0.5ML pen Inject 0.5 mLs (5 mg total) into the skin every 7 days.    levothyroxine (SYNTHROID, LEVOTHROID) 50 mcg tablet TAKE 1 TABLET BY MOUTH EVERY DAY    cyanocobalamin (VITAMIN B-12) 500 mcg tablet Take 1 tablet (500 mcg total) by mouth daily for Inadequate Vitamin B12, peripheral neuropathy.    atorvastatin (LIPITOR) 40 mg tablet TAKE 1 TABLET BY MOUTH IN THE EVENING    COLLAGEN PO Take by mouth daily.     escitalopram (LEXAPRO) 10 mg tablet Take 1 tablet (10 mg total) by mouth daily.    nystatin-triamcinolone (MYCOLOG II) cream Apply topically 2 times daily as needed for Skin Infection due to Candida Yeast. Apply to affected area    ibuprofen (ADVIL) 200 MG tablet Take 3 tablets (600 mg total) by mouth every 6 hours as needed for Pain.    Menthol, Topical Analgesic, (ICY HOT EX) Apply 1 applicator topically as needed (pain).    Menthol, Topical Analgesic, (BIOFREEZE EX) Apply 1 applicator topically as needed (pain).    ondansetron (ZOFRAN) 4 mg tablet TAKE 1 TABLET(4 MG) BY MOUTH THREE TIMES DAILY AS NEEDED    apixaban (ELIQUIS) 5 mg tablet Take 1 tablet (5 mg total) by mouth every 12 hours.       ROS: As outlined above  and in the nursing assessment. Comprehensive review of systems is otherwise negative.          Vitals:    11/19/22 1104   BP: 100/67   Pulse: 68   Temp: 35.3 C (95.5 F)   TempSrc: Temporal   SpO2: 91%   Weight: 119.4 kg (263 lb 3.2 oz)           PHYSICAL EXAMINATION:     General: Reveals a well-appearing woman. ECOG performance status is 1.  Weight is down 0.4 kg from last visit. HEENT exam: grossly unremarkable.  Sclerae are anicteric. Conjunctivae are pink.  Oral mucosa is without stomatitis or thrush. Lymph nodes: There is no palpable cervical, supraclavicular, axillary or inguinal lymph node enlargement. Lungs: clear to auscultation and percussion.  Cardiac exam:  reveals a regular rate and rhythm. No murmur is noted.  Chest: PowerPort is present in the upper chest on the right.  Wounds are healing.  Breasts: Left breast has a palpable mass in the upper outer quadrant measuring approximately 2.5 cm.  This appears to be smaller than on prior exams.  Overlying skin is unremarkable.  Abdomen: is soft.  Bowel sounds are normoactive.  There is no hepatosplenomegaly, mass or ascites detected by palpation and percussion..  Extremities: are without edema.  Neurologic exam: is grossly nonfocal. Skin: is  without rash, petechiae or ecchymoses.  Again noted are patches of vitiligo in the upper abdomen.    LABORATORY DATA:     Recent Results (from the past 4 weeks)   Multiple ordering docs    Collection Time: 11/13/22  8:00 AM   Result Value Ref Range    Mult Providers see below    TSH    Collection Time: 11/13/22  8:06 AM   Result Value Ref Range    TSH 19.60 (H) 0.27 - 4.20 uIU/mL   Hemoglobin A1c    Collection Time: 11/13/22  8:06 AM   Result Value Ref Range    Hemoglobin A1C 6.6 (H) %   Lipid Panel (Reflex to Direct  LDL if Triglycerides more than 400)    Collection Time: 11/13/22  8:06 AM   Result Value Ref Range    Cholesterol 113 mg/dL    Triglycerides 75 mg/dL    HDL 60 40 - 60 mg/dL    LDL Calculated 38 mg/dL    Non HDL Cholesterol 53 mg/dL    Chol/HDL Ratio 1.9    Comprehensive metabolic panel    Collection Time: 11/13/22  8:06 AM   Result Value Ref Range    Sodium 138 133 - 145 mmol/L    Potassium 4.5 3.3 - 5.1 mmol/L    Chloride 99 96 - 108 mmol/L    CO2 28 20 - 28 mmol/L    Anion Gap 11 7 - 16    UN 17 6 - 20 mg/dL    Creatinine 1.61 (H) 0.51 - 0.95 mg/dL    eGFR BY CREAT 64 *    Glucose 113 (H) 60 - 99 mg/dL    Calcium 09.6 (H) 8.6 - 10.2 mg/dL    Total Protein 6.6 6.3 - 7.7 g/dL    Albumin 4.4 3.5 - 5.2 g/dL    Bilirubin,Total 0.7 0.0 - 1.2 mg/dL    AST 30 0 - 35 U/L    ALT 25 0 - 35 U/L    Alk Phos 86 35 - 105 U/L   VITAMIN B12    Collection Time: 11/13/22  8:06 AM  Result Value Ref Range    Vitamin B12 654 232 - 1,245 pg/mL   CBC and differential    Collection Time: 11/13/22  8:06 AM   Result Value Ref Range    WBC 5.9 3.5 - 11.0 THOU/uL    RBC 4.9 4.0 - 5.5 MIL/uL    Hemoglobin 13.9 11.2 - 16.0 g/dL    Hematocrit 45 34 - 49 %    MCV 93 75 - 100 fL    RDW 16.3 (H) 0.0 - 15.0 %    Platelets 202 150 - 450 THOU/uL    Seg Neut % 56.1 %    Neut # K/uL 3.4 1.5 - 6.5 THOU/uL    Lymph # K/uL 1.7 1.0 - 5.0 THOU/uL    Mono # K/uL 0.4 0.1 - 1.0 THOU/uL    Eos # K/uL 0.4 0.0 - 0.5 THOU/uL    Baso # K/uL 0.1 0.0 -  0.2 THOU/uL    Nucl RBC % 0.0 0.0 - 0.2 /100 WBC    Nucl RBC # K/uL 0.0 0.0 - 0.0 THOU/uL   Neutrophil #-Instrument    Collection Time: 11/13/22  8:06 AM   Result Value Ref Range    Neutrophil #-Instrument 3.5 THOU/uL   Diff manual    Collection Time: 11/13/22  8:06 AM   Result Value Ref Range    Bands % 1 0 - 10 %    React Lymph % 1 0 - 6 %    Manual DIFF RESULTS     Diff Based On 114 CELLS   Unable to void    Collection Time: 11/13/22  8:16 AM   Result Value Ref Range    Unable to Void see below    EKG 12 lead - (During Visit)    Collection Time: 11/14/22  9:31 AM   Result Value Ref Range    Rate 96 bpm    QRSD 85 ms    QT 370 ms    QTc 469 ms    QRS -8 deg    T -55 deg   Microalbumin, Urine, Random    Collection Time: 11/15/22  7:50 AM   Result Value Ref Range    Creatinine,UR 181 20 - 300 mg/dL    Microalbumin,UR <1.61 SEE MA/CR RATIO mg/dL    Microalb/Creat Ratio see below 0.0 - 29.9 mg MA/g CR   Urinalysis with reflex to microscopic    Collection Time: 11/15/22  7:50 AM   Result Value Ref Range    Color, UA Yellow Yellow-Dark Yellow    Appearance,UR Clear Clear    Specific Gravity,UA 1.030 1.002 - 1.030    Leuk Esterase,UA NEG NEGATIVE    Nitrite,UA NEG NEGATIVE    pH,UA 5.5 5.0 - 8.0    Protein,UA NEG NEGATIVE    Glucose,UA NEG NEGATIVE    Ketones, UA NEG NEGATIVE    Blood,UA NEG NEGATIVE             Lab results: 11/13/22  0806 09/18/22  0859 08/08/22  1336 06/05/22  1214   WBC 5.9 5.4 6.6 6.1   Neut # K/uL 3.4 3.2  --  3.6   Lymph # K/uL 1.7 1.6  --  1.8   Hematocrit 45 40 42 40   Hemoglobin 13.9 12.5 13.4 12.5   MCV 93 93 94 94   Platelets 202 241 318 266           Lab results: 11/13/22  0806 09/18/22  0859 08/08/22  1336 06/05/22  1214   AST 30 21 26 31    ALT 25 19 29 30    Alk Phos 86 111* 139* 141*   Bilirubin,Total 0.7 0.6 0.6 0.5         IMAGING DATA:     ASSESSMENT: Ms. Woolley     She continues to smoke cigarettes.  I have recommended that she try if possible to stop smoking.     PLAN:     Clinical  stage IIB triple negative left breast cancer     Continue surveillance with follow up in 6 months.    Annual mammogram in September 2025.    Suggest you have thyroid hormone levels rechecked first week of December.     COVID-19     Patient has received the Pfizer COVID-19 vaccine, receiving the first dose on May 15, 2019, second dose on Jun 05, 2019 and booster dose on October 08, 2019.     She is eligible for a second booster dose.    I have recommend that she receive this 14 to 18 days after the chemotherapy treatment.     Patient advised to continue to take precautions to reduce risk of contracting COVID-19, including social distancing, frequent handwashing and using a mask when in public.    Health maintenance    Patient encouraged to stop smoking cigarettes entirely.  I have encouraged her to schedule an appointment with the smoking cessation clinic.    Recommend patient receive flu vaccine in the next few months.   I have recommend that she receive this 14 to 18 days after the chemotherapy treatment.       Follow-up     Return for follow-up in 3 weeks.     I personally spent more than 30 minutes on the calendar day of the encounter, including pre and post visit work as well as time spent directly with the patient reviewing blood test and imaging results, discussing management options and answering questions.       Oris Drone Griffith Citron, MD    xc:       Phineas Inches, MD              Sheliah Mends, MD              Joeseph Amor, MD

## 2022-11-19 NOTE — Patient Instructions (Addendum)
Continue surveillance with follow up in 6 months.    Annual mammogram in September 2025.    Suggest you have thyroid hormone levels rechecked first week of December.            November 2024      Sunday Monday Tuesday Wednesday Thursday Friday Saturday                            1     2       3     4     5    Outpatient Visit   7:57 AM   [facility] 6    FOLLOW UP VISIT   9:30 AM   (30 min.)   Patel, Parag P, MD   [facility] 7    Outpatient Visit   8:24 AM   [facility] 8     9       10     11    FOLLOW UP VISIT  11:00 AM   (30 min.)   Bohdan Macho D, MD   [facility] 12     13     14     15    JOINT 60 MIN   8:30 AM   (60 min.)   ASSIGNMENT 6   [facility] 16       17     18     19     20     21     22     23       24     25     26     27    FOLLOW UP VISIT   9:00 AM   (60 min.)   Behr, Jacqueline, NP   [facility] 28     29     07 January 2023      Sunday Monday Tuesday Wednesday Thursday Friday Saturday   1     2     3     4    FOLLOW UP VISIT   8:30 AM   (30 min.)   Ramos, Samantha, PA   [facility] 5     6     7       8     9     10     11     12     13    ARTHROPLASTY, HIP, TOTAL  10:45 AM   Kaplan, Nathan, MD   [facility] 14       15     16     17     18     19     20     21       22     23     24     25     26     27     28       29     30     08 February 2023      Sunday Monday Tuesday Wednesday Thursday Friday Saturday                  1     2     3     4       5     6     7     8     9     10    FOLLOW UP VISIT   9:30 AM   (30 min.)   McMorris, Scott, NP   [facility] 11       12     13     14     15     16     17     18       19     20     21     22     23     24     25       26     27     28     29     30     31                      February 2025      Sunday Monday  Tuesday Wednesday Thursday Friday Saturday                                 1       2     3     4     5     6     7     8       9     10     11     12     13     14     15       16     17     18     19     20     21     22       23     24     25     26     27     05 April 2023      Sunday Monday Tuesday Wednesday Thursday Friday Saturday                                 1       2     3     4     5     6     7     8       9     10     11     12     13     14     15       16     17     18     19     20     21   22       23     24     25     26     27     28     29       30     31                                           April 2025      Sunday Monday Tuesday Wednesday Thursday Friday Saturday             1     2     3     4     5       6     7     8     9     10     11     12       13     14     15     16     17     18     19       20     21     22     23     24     25     26       27     28     29     07 Jun 2023      Sunday Monday Tuesday Wednesday Thursday Friday Saturday                       1     2     3       4     5     6     7     8     9     10       11     12     13     14     15     16     17       18     19     20     21     22     23     24       25     26     27     28     29     30  Happy Birthday!     31

## 2022-11-20 ENCOUNTER — Ambulatory Visit: Payer: Medicare Other | Admitting: Cardiology

## 2022-11-20 NOTE — Discharge Instructions (Signed)
Pre-Operative Instructions - Total Joint Replacement                FOLLOW YOUR SURGEON'S INSTRUCTIONS IF DIFFERENT THAN BELOW.          When to Arrive for Surgery         On the day prior to your surgery you will find out your arrival time. Staff from the [facility] will call you between 1:30 PM and 4 PM on the day before surgery to inform you of your arrival and surgery times.         Note: Patients scheduled for a procedure on Monday will be assigned an arrival time on the Friday before. Please note surgery start time is approximate. You may want to bring something to help pass the time.        PLEASE ARRIVE ON TIME.        Directions to Surgical Center                                     [facility]: [address] [place], [place] [address]    On the day of your procedure, park in the parking garage and enter through the [employer].  Stop at the Information Desk in the lobby for directions to the Surgery Center on [address].        Eating Guidelines        Follow the instructions below unless otherwise instructed by your physician.        No solid food AFTER MIDNIGHT on the day of your surgery. No candy, gum, mints or chewing tobacco.        You can have clear liquids up to 2 hours before your arrival time. This includes only Gatorade, clear apple juice, and water.        Failure to follow these instructions, could lead to a delay or cancellation of your procedure.        Medication Guidelines        On the morning of surgery, take only the medications indicated on the Preoperative Medication List above.        Medications should only be taken with no more than one ounce of water.        Stop taking any non-steroidal anti-inflammatory agents (such as Ibuprofen, Advil, Motrin, Aleve, or Naproxen) for five days prior to surgery (if directed differently in medication chart on previous page please follow those instructions).        Please leave your prescriptions at home with the exception of  your inhalers.        Additional Information        THREE NIGHTS BEFORE SURGERY FOR INFECTION CONTROL  Put clean bedsheets on the first evening only, you do not need to change every night.  We ask you to shower for 3 evenings before your surgery using the 4% Chlorhexidine scrubs the nurse has given you.  Each night take a shower using antibacterial soap (such as Dial) and your normal shampoo/conditioner.  Afterward, while still in the shower, use 4% Chlorhexidine scrub and wash your body from the neck down. DO NOT wash your head, face, eyes, ears or genital region with the Chlorhexidine soap. This soap does not lather. Let the soap sit on your skin for 2 minutes. Rinse thoroughly.   Dry off with a clean, fresh towel each evening. Please also change your  bed sheets before your first shower. You do not need to change your bed sheets every night.  Do not apply anything on your skin, except deodorant after your showers. Deodorant is okay to use. Do not shave below your waist for one week prior to surgery.  After showering, dress in freshly laundered clothes each evening.   If you experience itching or redness on application, wash it off and do not use it again. Continue with antibacterial soap (such as Dial).  If showering on the morning of surgery follow the same instructions as above: use antibacterial soap followed by Chlorhexidine scrub.       What to bring or wear:        We ask that your cell phone be with you and turned on, so that the Preop and Operating Room nurses may phone you directly with instructions on the morning of surgery if necessary.        Bring Photo ID and insurance information.        Eye glasses and/or hearing aids:  These may be removed prior to surgery so be prepared to leave them with a trusted family member.        Do NOT wear contact lenses.        Wear comfortable, loose fit clothing.          You must arrange a ride home before coming to surgery.        What NOT to bring or wear:         Before coming to the hospital, remove all makeup (including mascara), jewelry (including wedding band and watch), hair accessories and nail polish from toes and fingers.  Do not bring any valuables (money, wallet, purse, jewelry, or contact lenses.)        Information for After Surgery:      Visiting Hours  Visiting hours are between 8 a.m. and 8 p.m.      Visitation for Patients Admitted to the Hospital    Two visitors, ages 50 or older, are allowed at the bedside at a time on inpatient units. Visitors under the age of 24 must be accompanied by an adult with legal identification.    Exceptions remain in place; visitors should consult the hospital's updated web site before visiting.    Guidelines for Patients Having Surgery   Only 1 visitor may accompany the patient during the pre-surgical process.   The visitors can remain socially distanced in the surgical waiting room.   If the patient is admitted, visitation/exception guidelines will be followed.    For more information regarding the [facility], please visit the following website:   [email or web address]          Your family will be notified when your surgery is completed and you have arrived on the patient care unit.        [facility]:        As a convenience, prior to your discharge we will fill any discharge medications you require.      The pharmacy is open from 9am to 5:30pm on weekdays and 10am-2pm on Saturdays.     If you will be alone on the day of surgery and want to leave with your prescribed medication, you may:  Bring a check made out to [facility]  Use a credit card to pay for your prescription  Have your family call the pharmacy with a credit card number  Only bring cash to the  hospital as a last resort. Prescriptions cannot be filled without payment. Thank you for your consideration.            Questions:    Please call the  [facility] at [phone] between 8:00 a.m. and 4:00 p.m. Monday through Friday.     Any questions regarding specifics about your surgery or recovery? Please call your surgeon's office.            Surgical Site Infections FAQs        What is a Surgical Site infection (SSI)?  A surgical site infection is an infection that occurs after surgery In the part of the body where the surgery took place. Most patients who have surgery do not develop an infection. However, Infections develop in about 1 to 3 out of every 100 patients who have surgery.         Some of the common symptoms of a surgical site infection are:    Redness and pain around the area where you had surgery    Drainage of cloudy fluid from your surgical wound    Fever         Can SSIs be treated?   Yes. Most surgical site infections can be treated with antibiotics. The antibiotic given to you depends on the bacteria (germs) causing the Infection. Sometimes patients with SSIs also need another surgery to treat the infection.         What are some of the things that hospitals are doing to prevent SSls?   To prevent SSIs, doctors, nurses, and other healthcare providers:    Clean their hands and arms up to their elbows with an antiseptic agent just before the surgery.    Clean their hands with soap and water or an alcohol-based hand rub before and after caring for each patient.    May remove some of your hair Immediately before your surgery using electric clippers If the hair Is in the same area where the procedure will occur. They should not shave you with a razor.    Wear special hair covers, masks, gowns, and gloves during surgery to keep the surgery area clean.    Give you antibiotics before your surgery starts. in most cases, you should get antibiotics within 60 mInutes before the surgery starts and the antibiotics should be stopped within 24 hours after surgery.    Clean the skin at the site of your surgery with a special soap  that kills germs.         What can I do to help prevent SSIs?   Before your surgery:    Tell your doctor about other medical problems you may have. Health problems such as allergies, diabetes, and obesity could affect your surgery and your treatment.    Quit smoking. Patients who smoke get more Infections. Talk to your doctor about how you can quit before your surgery.    Do not shave near where you will have surgery. Shaving with a razor can Irritate your skin and make it easier to develop an infection.         At the time of your surgery:    Speak up if someone tries to shave you with a razor before surgery. Ask why you need to be shaved and talk with your surgeon if you have any concerns.    Ask if you will get antibiotics before surgery.         After your surgery:  Make sure that your healthcare providers clean their hands before examining you, either with soap and water or an alcohol-based hand rub.   If you do not see your healthcare providers wash their hands,   please ask them to do so.     Family and friends who visit you should not touch the surgical wound or dressings.    Family and friends should clean their hands with soap and water or an alcohol-based hand rub before and after visiting you. If you do not see them clean their hands, ask them to clean their hands.   What do I need to do when I go home from the hospital?    Before you go home, your doctor or nurse should explain everyt hing you need to know about taking care of your wound. Make sure you understand how to care for your wound before you leave the hospital.    Always clean your hands before and after caring for your wound.    Before you go home, make sure you know who to contact If you have questions or problems after you get home.    If you have any symptoms of an Infection, such as redness and pain at the surgery site, drainage, or fever, call your doctor immediately.   if you have additional questions, Please ask your doctor or nurse.

## 2022-11-21 ENCOUNTER — Telehealth: Payer: Self-pay

## 2022-11-21 ENCOUNTER — Other Ambulatory Visit: Payer: Self-pay

## 2022-11-21 DIAGNOSIS — Z01818 Encounter for other preprocedural examination: Secondary | ICD-10-CM

## 2022-11-21 NOTE — Telephone Encounter (Signed)
Spoke with Carly Higgins about smoking cessation plan before her 12/21/22 Right Hip Replacement surgery.  She will get baseline smoking labs drawn tomorrow 11/22/22 at a [facility].  She stated she will no longer smoke from now until after her surgery date.  Carly Higgins knows she needs to get another set of smoking labs drawn 1 week prior to surgery - 12/14/22 and if this set of labs comes back positive for smoking her surgery is at risk of being cancelled.

## 2022-11-21 NOTE — Progress Notes (Signed)
First set of preop smoking labs ordered. Second set will be collected 1 week prior to surgery.

## 2022-11-22 ENCOUNTER — Other Ambulatory Visit: Payer: Self-pay

## 2022-11-22 ENCOUNTER — Other Ambulatory Visit
Admission: RE | Admit: 2022-11-22 | Discharge: 2022-11-22 | Disposition: A | Discharge status: Home / Self Care | Payer: Medicare Other | Source: Ambulatory Visit | Attending: Hematology and Oncology | Admitting: Hematology and Oncology

## 2022-11-22 DIAGNOSIS — C50412 Malignant neoplasm of upper-outer quadrant of left female breast: Secondary | ICD-10-CM | POA: Insufficient documentation

## 2022-11-22 DIAGNOSIS — Z171 Estrogen receptor negative status [ER-]: Secondary | ICD-10-CM | POA: Insufficient documentation

## 2022-11-22 DIAGNOSIS — Z01818 Encounter for other preprocedural examination: Secondary | ICD-10-CM | POA: Insufficient documentation

## 2022-11-22 LAB — COMPREHENSIVE METABOLIC PANEL
ALT: 21 U/L (ref 0–35)
AST: 24 U/L (ref 0–35)
Albumin: 4.5 g/dL (ref 3.5–5.2)
Alk Phos: 80 U/L (ref 35–105)
Anion Gap: 11 (ref 7–16)
Bilirubin,Total: 0.6 mg/dL (ref 0.0–1.2)
CO2: 27 mmol/L (ref 20–28)
Calcium: 9.9 mg/dL (ref 8.6–10.2)
Chloride: 101 mmol/L (ref 96–108)
Creatinine: 1.06 mg/dL — ABNORMAL HIGH (ref 0.51–0.95)
Glucose: 116 mg/dL — ABNORMAL HIGH (ref 60–99)
Lab: 25 mg/dL — ABNORMAL HIGH (ref 6–20)
Potassium: 5.2 mmol/L — ABNORMAL HIGH (ref 3.3–5.1)
Sodium: 139 mmol/L (ref 133–145)
Total Protein: 6.7 g/dL (ref 6.3–7.7)
eGFR BY CREAT: 59 * — AB

## 2022-11-22 LAB — CBC AND DIFFERENTIAL
Baso # K/uL: 0.1 10*3/uL (ref 0.0–0.2)
Eos # K/uL: 0.4 10*3/uL (ref 0.0–0.5)
Hematocrit: 43 % (ref 34–49)
Hemoglobin: 13.2 g/dL (ref 11.2–16.0)
IMM Granulocytes #: 0 10*3/uL (ref 0.0–0.0)
IMM Granulocytes: 0.2 %
Lymph # K/uL: 1.7 10*3/uL (ref 1.0–5.0)
MCV: 95 fL (ref 75–100)
Mono # K/uL: 0.5 10*3/uL (ref 0.1–1.0)
Neut # K/uL: 3 10*3/uL (ref 1.5–6.5)
Nucl RBC # K/uL: 0 10*3/uL (ref 0.0–0.0)
Nucl RBC %: 0 /100{WBCs} (ref 0.0–0.2)
Platelets: 221 10*3/uL (ref 150–450)
RBC: 4.6 MIL/uL (ref 4.0–5.5)
RDW: 17.2 % — ABNORMAL HIGH (ref 0.0–15.0)
Seg Neut %: 53.3 %
WBC: 5.6 10*3/uL (ref 3.5–11.0)

## 2022-11-22 LAB — RESOLUTION

## 2022-11-22 LAB — CARBOXYHEMOGLOBIN: CO: 3.6 %

## 2022-11-22 LAB — NEUTROPHIL #-INSTRUMENT: Neutrophil #-Instrument: 3 10*3/uL

## 2022-11-22 NOTE — Progress Notes (Signed)
Repeat Nicotine level ordered due to lab collection error.

## 2022-11-23 ENCOUNTER — Other Ambulatory Visit
Admission: RE | Admit: 2022-11-23 | Discharge: 2022-11-23 | Disposition: A | Discharge status: Home / Self Care | Payer: Medicare Other | Source: Ambulatory Visit | Attending: Emergency Medicine | Admitting: Emergency Medicine

## 2022-11-23 ENCOUNTER — Ambulatory Visit
Admission: RE | Admit: 2022-11-23 | Discharge: 2022-11-23 | Disposition: A | Discharge status: Home / Self Care | Payer: Medicare Other | Source: Ambulatory Visit | Attending: Orthopedic Surgery | Admitting: Orthopedic Surgery

## 2022-11-23 ENCOUNTER — Other Ambulatory Visit: Payer: Self-pay

## 2022-11-23 ENCOUNTER — Ambulatory Visit
Admission: RE | Admit: 2022-11-23 | Discharge: 2022-11-23 | Disposition: A | Discharge status: Home / Self Care | Payer: Medicare Other | Source: Ambulatory Visit

## 2022-11-23 VITALS — BP 102/58 | HR 62 | Temp 96.9°F | Resp 16 | Ht 66.14 in | Wt 263.9 lb

## 2022-11-23 DIAGNOSIS — Z01818 Encounter for other preprocedural examination: Secondary | ICD-10-CM | POA: Insufficient documentation

## 2022-11-23 DIAGNOSIS — R110 Nausea: Secondary | ICD-10-CM | POA: Insufficient documentation

## 2022-11-23 DIAGNOSIS — M1611 Unilateral primary osteoarthritis, right hip: Secondary | ICD-10-CM

## 2022-11-23 DIAGNOSIS — Z9481 Bone marrow transplant status: Secondary | ICD-10-CM | POA: Insufficient documentation

## 2022-11-23 DIAGNOSIS — C50412 Malignant neoplasm of upper-outer quadrant of left female breast: Secondary | ICD-10-CM | POA: Insufficient documentation

## 2022-11-23 LAB — MSSA/MRSA NAAT
MRSA nasal NAAT (PCR): NEGATIVE
MSSA Nasal NAAT (PCR): NEGATIVE

## 2022-11-23 LAB — PROTIME-INR
INR: 1.2 — ABNORMAL HIGH (ref 0.9–1.1)
Protime: 13.4 s — ABNORMAL HIGH (ref 10.0–12.9)

## 2022-11-23 LAB — T4, FREE: Free T4: 1.1 ng/dL (ref 0.9–1.7)

## 2022-11-23 LAB — APTT: aPTT: 32.9 s (ref 25.8–37.9)

## 2022-11-23 LAB — TSH: TSH: 18.6 u[IU]/mL — ABNORMAL HIGH (ref 0.27–4.20)

## 2022-11-23 NOTE — Discharge Instructions (Signed)
Pre-Operative Instructions - Total Joint Replacement                FOLLOW YOUR SURGEON'S INSTRUCTIONS IF DIFFERENT THAN BELOW.          When to Arrive for Surgery         On the day prior to your surgery you will find out your arrival time. Staff from the [facility] will call you between 1:30 PM and 4 PM on the day before surgery to inform you of your arrival and surgery times.         Note: Patients scheduled for a procedure on Monday will be assigned an arrival time on the Friday before. Please note surgery start time is approximate. You may want to bring something to help pass the time.        PLEASE ARRIVE ON TIME.        Directions to Surgical Center                                     [facility]: [address] [place], [place] [address]    On the day of your procedure, park in the parking garage and enter through the [employer].  Stop at the Information Desk in the lobby for directions to the Surgery Center on [address].        Eating Guidelines        Follow the instructions below unless otherwise instructed by your physician.        No solid food AFTER MIDNIGHT on the day of your surgery. No candy, gum, mints or chewing tobacco.        You can have clear liquids up to 2 hours before your arrival time. This includes only Gatorade, clear apple juice, and water.        Failure to follow these instructions, could lead to a delay or cancellation of your procedure.        Medication Guidelines        On the morning of surgery, take only the medications indicated on the Preoperative Medication List above.        Medications should only be taken with no more than one ounce of water.        Stop taking any non-steroidal anti-inflammatory agents (such as Ibuprofen, Advil, Motrin, Aleve, or Naproxen) for five days prior to surgery (if directed differently in medication chart on previous page please follow those instructions).        Please leave your prescriptions at home with the exception of  your inhalers.        Additional Information        THREE NIGHTS BEFORE SURGERY FOR INFECTION CONTROL  Put clean bedsheets on the first evening only, you do not need to change every night.  We ask you to shower for 3 evenings before your surgery using the 4% Chlorhexidine scrubs the nurse has given you.  Each night take a shower using antibacterial soap (such as Dial) and your normal shampoo/conditioner.  Afterward, while still in the shower, use 4% Chlorhexidine scrub and wash your body from the neck down. DO NOT wash your head, face, eyes, ears or genital region with the Chlorhexidine soap. This soap does not lather. Let the soap sit on your skin for 2 minutes. Rinse thoroughly.   Dry off with a clean, fresh towel each evening. Please also change your  bed sheets before your first shower. You do not need to change your bed sheets every night.  Do not apply anything on your skin, except deodorant after your showers. Deodorant is okay to use. Do not shave below your waist for one week prior to surgery.  After showering, dress in freshly laundered clothes each evening.   If you experience itching or redness on application, wash it off and do not use it again. Continue with antibacterial soap (such as Dial).  If showering on the morning of surgery follow the same instructions as above: use antibacterial soap followed by Chlorhexidine scrub.       What to bring or wear:        We ask that your cell phone be with you and turned on, so that the Preop and Operating Room nurses may phone you directly with instructions on the morning of surgery if necessary.        Bring Photo ID and insurance information.        Eye glasses and/or hearing aids:  These may be removed prior to surgery so be prepared to leave them with a trusted family member.        Do NOT wear contact lenses.        Wear comfortable, loose fit clothing.          You must arrange a ride home before coming to surgery.        What NOT to bring or wear:         Before coming to the hospital, remove all makeup (including mascara), jewelry (including wedding band and watch), hair accessories and nail polish from toes and fingers.  Do not bring any valuables (money, wallet, purse, jewelry, or contact lenses.)        Information for After Surgery:      Visiting Hours  Visiting hours are between 8 a.m. and 8 p.m.      Visitation for Patients Admitted to the Hospital    Two visitors, ages 50 or older, are allowed at the bedside at a time on inpatient units. Visitors under the age of 24 must be accompanied by an adult with legal identification.    Exceptions remain in place; visitors should consult the hospital's updated web site before visiting.    Guidelines for Patients Having Surgery   Only 1 visitor may accompany the patient during the pre-surgical process.   The visitors can remain socially distanced in the surgical waiting room.   If the patient is admitted, visitation/exception guidelines will be followed.    For more information regarding the [facility], please visit the following website:   [email or web address]          Your family will be notified when your surgery is completed and you have arrived on the patient care unit.        [facility]:        As a convenience, prior to your discharge we will fill any discharge medications you require.      The pharmacy is open from 9am to 5:30pm on weekdays and 10am-2pm on Saturdays.     If you will be alone on the day of surgery and want to leave with your prescribed medication, you may:  Bring a check made out to [facility]  Use a credit card to pay for your prescription  Have your family call the pharmacy with a credit card number  Only bring cash to the  hospital as a last resort. Prescriptions cannot be filled without payment. Thank you for your consideration.            Questions:    Please call the  [facility] at [phone] between 8:00 a.m. and 4:00 p.m. Monday through Friday.     Any questions regarding specifics about your surgery or recovery? Please call your surgeon's office.            Surgical Site Infections FAQs        What is a Surgical Site infection (SSI)?  A surgical site infection is an infection that occurs after surgery In the part of the body where the surgery took place. Most patients who have surgery do not develop an infection. However, Infections develop in about 1 to 3 out of every 100 patients who have surgery.         Some of the common symptoms of a surgical site infection are:    Redness and pain around the area where you had surgery    Drainage of cloudy fluid from your surgical wound    Fever         Can SSIs be treated?   Yes. Most surgical site infections can be treated with antibiotics. The antibiotic given to you depends on the bacteria (germs) causing the Infection. Sometimes patients with SSIs also need another surgery to treat the infection.         What are some of the things that hospitals are doing to prevent SSls?   To prevent SSIs, doctors, nurses, and other healthcare providers:    Clean their hands and arms up to their elbows with an antiseptic agent just before the surgery.    Clean their hands with soap and water or an alcohol-based hand rub before and after caring for each patient.    May remove some of your hair Immediately before your surgery using electric clippers If the hair Is in the same area where the procedure will occur. They should not shave you with a razor.    Wear special hair covers, masks, gowns, and gloves during surgery to keep the surgery area clean.    Give you antibiotics before your surgery starts. in most cases, you should get antibiotics within 60 mInutes before the surgery starts and the antibiotics should be stopped within 24 hours after surgery.    Clean the skin at the site of your surgery with a special soap  that kills germs.         What can I do to help prevent SSIs?   Before your surgery:    Tell your doctor about other medical problems you may have. Health problems such as allergies, diabetes, and obesity could affect your surgery and your treatment.    Quit smoking. Patients who smoke get more Infections. Talk to your doctor about how you can quit before your surgery.    Do not shave near where you will have surgery. Shaving with a razor can Irritate your skin and make it easier to develop an infection.         At the time of your surgery:    Speak up if someone tries to shave you with a razor before surgery. Ask why you need to be shaved and talk with your surgeon if you have any concerns.    Ask if you will get antibiotics before surgery.         After your surgery:  Make sure that your healthcare providers clean their hands before examining you, either with soap and water or an alcohol-based hand rub.   If you do not see your healthcare providers wash their hands,   please ask them to do so.     Family and friends who visit you should not touch the surgical wound or dressings.    Family and friends should clean their hands with soap and water or an alcohol-based hand rub before and after visiting you. If you do not see them clean their hands, ask them to clean their hands.   What do I need to do when I go home from the hospital?    Before you go home, your doctor or nurse should explain everyt hing you need to know about taking care of your wound. Make sure you understand how to care for your wound before you leave the hospital.    Always clean your hands before and after caring for your wound.    Before you go home, make sure you know who to contact If you have questions or problems after you get home.    If you have any symptoms of an Infection, such as redness and pain at the surgery site, drainage, or fever, call your doctor immediately.   if you have additional questions, Please ask your doctor or nurse.

## 2022-11-23 NOTE — Result Encounter Note (Signed)
lMOM to see if pt is taking her synthrooid. Advised how to correctly take.  If taking, take 2 tabs on sat and sun.   Call with any questions./kk

## 2022-11-24 LAB — TYPE AND SCREEN: Antibody Screen: NEGATIVE

## 2022-11-26 ENCOUNTER — Ambulatory Visit: Payer: Medicare Other

## 2022-11-26 ENCOUNTER — Other Ambulatory Visit: Payer: Self-pay

## 2022-11-26 DIAGNOSIS — Z713 Dietary counseling and surveillance: Secondary | ICD-10-CM

## 2022-11-26 NOTE — Result Encounter Note (Signed)
Spoke to pt -she is taking her pill the same way that she always does. Only new change is mounjaro and lyrica.  Not sure if related.  She will double up next 2 days.Carly Higgins

## 2022-11-26 NOTE — Progress Notes (Signed)
[facility] - Nutrition Assessment      62 y.o. female presents to [facility] for a nutrition consultation via phone call.  Carly Higgins has a h/o of allogenic BMT (2017), triple negative breast cancer (2022) and DM Type II.  Schedule for hip replacement in December 2024.  Referred by medical oncology team for nutrition consultation    Patient's Nutrition concerns: weight management; reports losing 15#/month on Mounjaro.    Social History: single; lives in an apartment; goes to [place] during the winter    Current Dietary Pattern/ Food Preferences: Breakfast- coffee with sugar and creamer, light Chobani yogurt, fruit  Lunch- salad with chick peas or [place], 1-2 Tbsp EVOO; roll on occasion  Dinner- meat/vegetables/potato    Plant-based proteins- likes humus, chick peas  Other protein foods- eggs, chicken  Fruits/Vegetables- about 3 servings total/day  Fluids- about 48 oz/day  Snacks- pretzels, fudgsicles    Physical Activity: limited due to hip pain- replacement scheduled for Dec 2024    Nutrition Impact Symptoms: None currently reported       Anthropometrics:  Height:   Ht Readings from Last 1 Encounters:   11/23/22 168 cm (5' 6.14")      Weight History:   Wt Readings from Last 5 Encounters:   11/23/22 119.7 kg (263 lb 14.3 oz)   11/19/22 119.4 kg (263 lb 3.2 oz)   11/14/22 118.4 kg (261 lb)   10/01/22 123.8 kg (273 lb)   09/24/22 124 kg (273 lb 5.9 oz)       Patient History: Reviewed  )  Medications: Reviewed     Pertinent Labs:  Reviewed  Of Note: A1c 6.6 (11/13/2022); ^ BUN/Cr    Nutrition Assessment: Pleasant and engaging 62 y.o. female  presents to [facility] for nutrition consultation via phone call.  Carly Higgins has a h/o of allogenic BMT (2017), triple negative breast cancer (2022) and DM Type II.  Schedule for hip replacement in December 2024.    Patient's Nutrition concerns: weight management; reports losing  15#/month on Mounjaro.      We reviewed the benefits and components of a whole foods, plant-forward eating pattern consistent with evidence-based recommendations from [employer] for [employer].  We discussed ways to increase the variety of fruits, vegetables, whole grains and plant-based proteins into menus.  We reviewed balancing macronutrients at meals and appropriate portion sizes to better meet estimated nutrient needs.    Reviewed her current fluid intake- reports 40-64 oz/day.  Noted elevated UN/Cr lab values- recommend at least 64-80 oz fluid/day.    Plan/Recommendations:  1. Reviewed whole foods, plant-forward eating pattern.  2. Physical activity:as tolerated; scheduled for hip replacement Dec 2024.  3. Push fluids- recommend 64-80 oz/day  4. Pt has contact information for writer, if further questions/concerns arise.    Azzie Roup, RD, CSO  Registered Dietitian  Board Certified Specialist in Oncology Nutrition  [facility]  [phone] (direct line)  Work Days: Monday, Tuesday and Wednesday

## 2022-11-27 ENCOUNTER — Telehealth: Payer: Self-pay | Admitting: Internal Medicine

## 2022-11-27 LAB — NICOTINE & METABOLITE
Cotinine: 48 ng/mL
Nicotine: <5 ng/mL

## 2022-11-27 NOTE — Telephone Encounter (Signed)
She wanted you to know that she takes B12 supplements 50 mcg per day.  She wanted you to know just in case this could affect her thyroid.

## 2022-11-28 ENCOUNTER — Ambulatory Visit: Payer: Medicare Other

## 2022-11-29 ENCOUNTER — Ambulatory Visit: Admission: RE | Admit: 2022-11-29 | Payer: Medicare Other | Source: Ambulatory Visit | Admitting: Cardiology

## 2022-11-29 ENCOUNTER — Telehealth: Payer: Self-pay | Admitting: Internal Medicine

## 2022-11-29 ENCOUNTER — Encounter: Admission: RE | Payer: Self-pay | Source: Ambulatory Visit

## 2022-11-29 DIAGNOSIS — I4891 Unspecified atrial fibrillation: Secondary | ICD-10-CM

## 2022-11-29 SURGERY — ABLATION - A-FIB
Anesthesia: General

## 2022-11-29 NOTE — Telephone Encounter (Signed)
Spoke to pt- gave this message. Pt understands.

## 2022-11-29 NOTE — Patient Instructions (Incomplete)
Your Neuro-Oncology team is:  Physician: Dr. Junie Panning Hemminger  Neuro-oncology Fellows: Dr. Esmond Harps, Dr. Vernell Barrier  Nurse Practitioner: Sherlon Handing (day off Friday)  Registered Nurse: Thurmond Butts (day off Monday)  Pharmacist: Oswaldo Conroy  Social Work: Earnest Conroy & Cindee Salt (Phone #[phone])    [facility] is located in [address] on the first floor of the Cancer Center.  Wednesday [facility] is located in [address] on the second floor of the [facility].    Phone: [phone]  Fax: [phone]  Please be prepared to let the secretary know why you are calling. This will help let your nurse know how urgent the call is. We will return your call as soon as possible. The phone is answered by an on-call provider during after hours, weekends and holidays. They can access your chart and assist you with any urgent matters.

## 2022-12-01 NOTE — Progress Notes (Deleted)
Neuro-Oncology New Patient Visit    Date: 12/05/2022    Chief Complaint  Chemotherapy-Induced Peripheral Neuropathy    History of Present Illness   Carly Higgins is a 62 y.o. woman with a past medical history notable for AF (Eliquis), CHF, DM, hypothyroidism, myeloproliferative neoplasm s/p MUD allo and breast cancer who is presenting for evaluation of chemotherapy-induced peripheral neuropathy.     Carly Higgins is unaccompanied at today's visit.     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  She received 4 cycles of doxorubicin and cyclophosphamide followed by left partial mastectomy and SLNB on 01/24/2021.  She then received 4 cycles of weekly paclitaxel and carboplatin as neoadjuvant chemotherapy (last dose 12/08/2020).  Immunotherapy with pembrolizumab was not recommend due to her previous allogenic bone marrow transplant.  Radiation therapy was completed in May 2023.    She reports developing neuropathy in the bilateral feet with her initial treatment prior to mastectomy.  It was relatively mild and she iced her feet during remaining treatments.  Despite that, symptoms significantly worsened.  This month marks 2 years that she has had this degree of pain.  She currently describes pain that she describes as a vice grip, cold ([place]), hard, tight, tingling, numbness.  Primarily toes to ball of foot, but on occasion feels tingle down legs.    Of note, she was scheduled for a right hip replacement in September 2024 and tested positive for Covid delaying the surgery.  It has now been rescheduled for December 2024.  Left hip replaced 2023.    Relevant  Diagnostics  Vitamin B12: 347 on 10/11/2021  Folate: >14 on 10/11/2021  TSH: 4.01 on 06/05/2022   Free T4: 1.0 on 06/07/2016  Hemoglobin A1c: 6.6 on 08/08/2022    Current Neuropathy Treatments  - Nervive     Previous Neuropathy Treatments  - duloxetine 60 mg daily: ineffective and returned to lexapro for depression  - gabapentin 300-600 mg daily prn: ineffective  - tramadol 50 mg every 6 hrs prn pain: ineffective  - Acupuncture x 3: ineffective  - Massage: felt good, but didn't help neuropathy  - Icy hot  - Biofreeze  - Voltaren  - Microwave slippers    Oncology History Overview Note   Carly Higgins is status post 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    In February 2022 she began to experience discomfort in the left breast.  She first attributed this to having been struck with a part of her dog when she picked it up about 1 week earlier.  She continued to experience discomfort intermittently. She scheduled a screening mammogram, having not had an exam since July 2018.         Myelodysplastic or myeloproliferative disease   01/19/2014 Initial Diagnosis    MPN (myeloproliferative neoplasm)     07/15/2020 - 12/08/2020 Chemotherapy    Began cycle 1 of weekly paclitaxel and carboplatin on July 15, 2020.  Immunotherapy with pembrolizumab was not recommended due to her previous allogenic bone marrow transplant.  Final cycle (4) of paclitaxel and carboplatin was administered on September 30, 2020.  Began dose dense cyclophosphamide and doxorubicin on  October 06, 2020    OP Breast CARBOplatin Weekly Paclitaxel then [facility]   Plan Provider: Alison Murray, MD  Treatment goal: Neoadjuvant  Line of treatment: [No plan line of treatment]     IDC left 2 o'clock 2.5 cm, ER/PR- her 2-   05/18/2020 Significant Radiology Findings    Screening [place]: scattered tissue, architectural distortion left 2 o'clock. BIRAD 0       06/07/2020 Significant Radiology  Findings    Ultrasound [place]: irregular hypoechoic mass left 2 o'clock, 19x19x12 mm, 25 mm radial. middle third upper outer quadrant of the left breast. BIRAD 4  Ultrasound guided core IDC     06/09/2020 Procedure    ultrasound-guided core biopsy of the left breast 2:00 lesion:   Biopsy revealed invasive ductal carcinoma.  Provisional combined Nottingham grade was 3 of 3 (tubule formation score 3, nuclear grade 3, mitotic rate score 2).  Tumor cells were negative for estrogen receptors and progesterone receptors.  Tumor cells were negative for HER2 by immunohistochemical staining with a staining score of 1+.  Ki-67 staining was seen in approximately 70% of tumor cells.        06/13/2020 Consultation    She was seen in the Hereditary Oncology clinic :  Because of her prior allogeneic bone marrow transplant, she was not eligible for standard germline testing with blood or saliva specimen.       06/15/2020 Initial Diagnosis    IDC left 2 o'clock 2.5 cm, ER/PR- her 2-     06/15/2020 Cancer Staged    Staging form: Breast, AJCC 8th Edition  - Clinical stage from 06/15/2020: Stage IIB (cT2, cN0, cM0, G3, ER-, PR-, HER2-)     06/15/2020 Consultation    Surgical consultation:  Dr. Claris Gower      06/20/2020 Significant Radiology Findings    Whole-body bone scan : No abnormal focus of radiotracer uptake to suggest osseous metastatic disease.     06/22/2020 Significant Radiology Findings    CT of the chest: mill-defined enhancing lesion in the lateral left breast measuring 1.7 x 1.2 x 1.1 cm.  There were asymmetrically prominent level 1 left axillary lymph nodes, enhancement of uncertain significance, possibly representing metastatic disease. No enlarged mediastinal, hilar, intramammary,  supraclavicular lymph nodes or suspicious pulmonary nodules were identified.       06/22/2020 Significant Radiology Findings    CT of the abdomen pelvis : no evidence of metastatic disease . small fat filled umbilical hernia noted.     06/28/2020  Significant Radiology Findings    MRI of the breast: irregular mass with spiculated margins measuring 10 x 25 x 19 mm in the left breast upper outer quadrant at the 2 o'clock position located 3 cm from the nipple.      07/15/2020 - 12/08/2020 Chemotherapy    Began cycle 1 of weekly paclitaxel and carboplatin on July 15, 2020.  Immunotherapy with pembrolizumab was not recommended due to her previous allogenic bone marrow transplant.  Final cycle (4) of paclitaxel and carboplatin was administered on September 30, 2020.  Began dose dense cyclophosphamide and doxorubicin on October 06, 2020    OP Breast CARBOplatin Weekly Paclitaxel then [facility]   Plan Provider: Alison Murray, MD  Treatment goal: Neoadjuvant  Line of treatment: [No plan line of treatment]     08/18/2020 Procedure    PowerPort in the right internal jugular vein.     12/21/2020 Significant Radiology Findings    Left breast mammogram and ultrasound on  December 21, 2020:  Impression:  Mass in the left breast upper outer quadrant at 2 o'clock located 8 centimeters from the nipple is a known malignancy. Surgical consultation is recommended. Patient completed chemotherapy treatment on 12/08/2020. Patient is scheduled for left lumpectomy on 01/24/2021.     Finding 2:  Normal lymph node and biopsy clip in the left axilla is benign.     Finding 3:  Stable calcifications in the left breast are benign.        01/04/2021 Significant Radiology Findings    MRI breasts on December 15, 2020:  Previously seen irregular spiculated enhancing mass in the left breast does not demonstrate significant residual enhancement. A biopsy clip is noted along the medial margin of the previously seen mass.  There is no axillary or internal mammary lymphadenopathy. In the right breast, there is no evidence of any mass or suspicious enhancements.  Impression:  Area in the left breast is a known malignancy. No significant residual enhancement in the area of previously seen mass, consistent with  excellent/complete radiologic response.        01/24/2021 Surgery    Wire localized partial mastectomy and sentinel lymph node biopsy- Carly Higgins    Complete pathologic response.      02/07/2021 Cancer Staged    Staging form: Breast, AJCC 8th Edition  - Pathologic stage from 02/07/2021: No Stage Recommended (ypT0, pN0, cM0, GX)         Past Medical/Surgical History   - Reviewed, please see eRecord and HPI for details    Social History  - Reviewed, please see eRecord and HPI for details    Family History   - Reviewed, please see eRecord and HPI for details    Allergies  No Known Allergies (drug, envir, food or latex)    Current Medications  Current Outpatient Medications   Medication Sig    ibuprofen (ADVIL,MOTRIN) 400 mg tablet Take 2 capsules (400 mg total) by mouth 4 times daily as needed for Pain.    Lidocaine-Menthol (NERVIVE ROLL-ON EX) Apply 1 g topically as needed. For toe pain    metoprolol succinate ER (TOPROL-XL) 200 mg 24 hr tablet Take 1 tablet (200 mg total) by mouth daily. Do not crush or chew. May be divided.    ondansetron (ZOFRAN-ODT) 4 MG disintegrating tablet Take 1 tablet (4 mg total) by mouth 3 times daily as needed. Place on top of tongue.    pregabalin (LYRICA) 100 mg capsule TAKE 1 CAPSULE(100 MG) BY MOUTH TWICE DAILY FOR NERVE PAIN. MAX DAILY DOSE: 200 MG    acyclovir (ZOVIRAX) 400 mg tablet TAKE 1 TABLET(400 MG) BY MOUTH TWICE DAILY    tirzepatide (MOUNJARO) 5 MG/0.5ML pen Inject 0.5 mLs (5 mg total) into the skin every 7 days.    levothyroxine (SYNTHROID, LEVOTHROID) 50 mcg tablet TAKE 1 TABLET BY MOUTH EVERY DAY    cyanocobalamin (VITAMIN B-12) 500 mcg tablet Take 1 tablet (500 mcg total) by mouth daily for Inadequate Vitamin B12, peripheral neuropathy.    atorvastatin (LIPITOR) 40 mg tablet TAKE 1 TABLET BY MOUTH IN THE EVENING    COLLAGEN PO Take 2 scoops by mouth daily.    escitalopram (LEXAPRO) 10 mg tablet Take 1 tablet (10 mg total) by mouth daily.    nystatin-triamcinolone  (MYCOLOG II) cream Apply topically 2 times daily as needed for Skin Infection due to Candida Yeast. Apply to affected area (Patient not taking: Reported on 11/23/2022)    Menthol, Topical Analgesic, (ICY HOT  EX) Apply 1 applicator topically as needed (pain).    Menthol, Topical Analgesic, (BIOFREEZE EX) Apply 1 applicator topically as needed (pain).    apixaban (ELIQUIS) 5 mg tablet Take 1 tablet (5 mg total) by mouth every 12 hours.        Review of Systems:  Notable ROS as per HPI, otherwise all (>11) systems were negative.    Vital Signs   There were no vitals filed for this visit.      Physical Exam:   General: Patient is well-appearing. No acute distress.  HEENT: Incision appears clean, dry, intact.  Pulmonary: Normal work of breathing on RA.  Ext: No edema or deformities.    Neurologic Exam:  Mental Status: Awake and alert. Oriented to conversation. Speech fluent. Comprehension intact, follows complex commands.Affect appropriate.  Cranial Nerves:       II (R/L): Pupils 3/3 to 2/2, fields intact to confrontation bilaterally.    III/IV/VI: Versions intact without nystagmus, no gaze preference.    V: Facial sensation symmetric to light touch.    VII: Facial expression symmetric at rest and with activation.    VIII: Hearing intact to voice.    IX/X: Palate elevates symmetrically.    XI: Shoulder shrug symmetric.    XII: Tongue midline.  Motor (R/L): Bulk and tone were normal throughout. Strength was full on confrontational testing. Pronator drift was absent. There were no abnormal movements. Normal finger tapping bilaterally.  Sensory (R/L): Sensation to light touch and pinprick was symmetric throughout. There is no extinction. Romberg was absent.  Coordination: Finger to nose and heel to shin were intact.  Reflexes (R/L): 1+ and symmetric. There is no ankle clonus.  Downgoing toes bilaterally.  Gait: Narrow based and normal on casual gait.     Laboratory Data  Personally reviewed and notable  for:    08/08/2022:  CBC non-contributory    CMP notable for glucose 104    Pathology report 06/09/2020:          Neuroimaging  - No relevant CNS imaging available for review.    EMG/NCS  No results found    Impression and Plan:  Carly Higgins is a 62 y.o. woman with history of AF on Eliquis, CHF, DM, hypothyroidism, myeloproliferative neoplasm and breast cancer who is presenting to the Neuro-Oncology Clinic with chemotherapy-induced peripheral neuropathy. All laboratory and imaging data were personally reviewed as noted above.     Carly Higgins has a history and physical examination consistent with chemotherapy-induced peripheral neuropathy.  The pathophysiology of CIPN was explained.  Her pain is severe and has persisted for 2 years.  I am concerned that she may not see any significant improvement with her last chemotherapy dose being administered 20 months ago.  My hope is to improve her pain with the use of pharmacologic agents.  It was advised that she start Lyrica 50 mg twice daily and we can dose escalate as needed.  I would also like to check a vitamin B12 level to see if she is deficient which could contribute to symptoms (last vitamin B12 level was 347 in 2023).    She has diabetes mellitus with a HgbA1c of 6.6 and it was explained that it is imperative that she maintain good control to prevent the neuropathy from worsening.    Chemotherapy-Induced Peripheral Neuropathy  - Start Lyrica 50 mg twice daily  - Check vitamin B12 level  - Maintain good control of diabetes  - Encouraged well-balanced diet and routine physical  activity    Please feel free to contact me with further questions or concerns.    Follow up: 2-3 months    Huxley Vanwagoner, NP  Neuro-Oncology    The patient will continue to be seen in our clinic as part of a longitudinal relationship for the complex medical condition of chemotherapy-induced peripheral neuropathy which requires specialized clinical knowledge and experience.

## 2022-12-03 ENCOUNTER — Telehealth: Payer: Self-pay | Admitting: Thoracic Diseases

## 2022-12-03 ENCOUNTER — Encounter: Payer: Self-pay | Admitting: Internal Medicine

## 2022-12-04 ENCOUNTER — Ambulatory Visit: Payer: Self-pay | Admitting: Internal Medicine

## 2022-12-04 ENCOUNTER — Encounter: Payer: Self-pay | Admitting: Internal Medicine

## 2022-12-04 ENCOUNTER — Other Ambulatory Visit: Payer: Self-pay

## 2022-12-04 VITALS — BP 112/72 | HR 80 | Resp 16 | Ht 66.25 in | Wt 263.0 lb

## 2022-12-04 DIAGNOSIS — F4323 Adjustment disorder with mixed anxiety and depressed mood: Secondary | ICD-10-CM

## 2022-12-04 DIAGNOSIS — Z171 Estrogen receptor negative status [ER-]: Secondary | ICD-10-CM

## 2022-12-04 DIAGNOSIS — K219 Gastro-esophageal reflux disease without esophagitis: Secondary | ICD-10-CM

## 2022-12-04 DIAGNOSIS — Z9481 Bone marrow transplant status: Secondary | ICD-10-CM

## 2022-12-04 DIAGNOSIS — F331 Major depressive disorder, recurrent, moderate: Secondary | ICD-10-CM

## 2022-12-04 DIAGNOSIS — G620 Drug-induced polyneuropathy: Secondary | ICD-10-CM

## 2022-12-04 DIAGNOSIS — F605 Obsessive-compulsive personality disorder: Secondary | ICD-10-CM

## 2022-12-04 DIAGNOSIS — E039 Hypothyroidism, unspecified: Secondary | ICD-10-CM

## 2022-12-04 DIAGNOSIS — E1140 Type 2 diabetes mellitus with diabetic neuropathy, unspecified: Secondary | ICD-10-CM

## 2022-12-04 DIAGNOSIS — M150 Primary generalized (osteo)arthritis: Secondary | ICD-10-CM

## 2022-12-04 DIAGNOSIS — F419 Anxiety disorder, unspecified: Secondary | ICD-10-CM

## 2022-12-04 DIAGNOSIS — M5416 Radiculopathy, lumbar region: Secondary | ICD-10-CM

## 2022-12-04 DIAGNOSIS — I472 Ventricular tachycardia, unspecified: Secondary | ICD-10-CM

## 2022-12-04 DIAGNOSIS — M25551 Pain in right hip: Secondary | ICD-10-CM

## 2022-12-04 DIAGNOSIS — D471 Chronic myeloproliferative disease: Secondary | ICD-10-CM

## 2022-12-04 DIAGNOSIS — E669 Obesity, unspecified: Secondary | ICD-10-CM

## 2022-12-04 DIAGNOSIS — I480 Paroxysmal atrial fibrillation: Secondary | ICD-10-CM

## 2022-12-04 DIAGNOSIS — Z0000 Encounter for general adult medical examination without abnormal findings: Secondary | ICD-10-CM

## 2022-12-04 NOTE — Progress Notes (Signed)
Visit performed as:            Office Visit, met with patient in person    Today we reviewed and updated Carly Higgins's smoking status, activities of daily living, depression screen, fall risk, medications and allergies.   I have counseled the patient in the above areas.     Subjective:     Chief Complaint: Carly Higgins is a 62 y.o. female here for a/an Subsequent Annual Medicare Visit    In general, Carly Higgins rates their overall health as:  fair      Patient Care Team:  Volanda Napoleon, MD as PCP - General  Caren Griffins, RN as **Registered Nurse (RN) (Oncology)  Joeseph Amor, MD (Hematology)  Alison Murray, MD (Hematology and Oncology)  Rav[place] Caul Loura Back, MD (Radiation Oncology)  Sheliah Mends, MD as Referring Physician (Surgical Oncology)  Alison Stalling, MD as Consulting Provider (Cardiology)     Current Outpatient Medications on File Prior to Visit   Medication Sig Dispense Refill    ibuprofen (ADVIL,MOTRIN) 400 mg tablet Take 2 capsules (400 mg total) by mouth 4 times daily as needed for Pain.      Lidocaine-Menthol (NERVIVE ROLL-ON EX) Apply 1 g topically as needed. For toe pain      metoprolol succinate ER (TOPROL-XL) 200 mg 24 hr tablet Take 1 tablet (200 mg total) by mouth daily. Do not crush or chew. May be divided. 90 tablet 3    ondansetron (ZOFRAN-ODT) 4 MG disintegrating tablet Take 1 tablet (4 mg total) by mouth 3 times daily as needed. Place on top of tongue. 30 tablet 0    pregabalin (LYRICA) 100 mg capsule TAKE 1 CAPSULE(100 MG) BY MOUTH TWICE DAILY FOR NERVE PAIN. MAX DAILY DOSE: 200 MG 60 capsule 2    acyclovir (ZOVIRAX) 400 mg tablet TAKE 1 TABLET(400 MG) BY MOUTH TWICE DAILY 180 tablet 3    tirzepatide (MOUNJARO) 5 MG/0.5ML pen Inject 0.5 mLs (5 mg total) into the skin every 7 days. 2 mL 6    levothyroxine (SYNTHROID, LEVOTHROID) 50 mcg tablet TAKE 1 TABLET BY MOUTH EVERY DAY 90 tablet 5    cyanocobalamin (VITAMIN B-12) 500 mcg tablet Take 1 tablet (500 mcg  total) by mouth daily for Inadequate Vitamin B12, peripheral neuropathy. 100 tablet 5    atorvastatin (LIPITOR) 40 mg tablet TAKE 1 TABLET BY MOUTH IN THE EVENING 90 tablet 5    escitalopram (LEXAPRO) 10 mg tablet Take 1 tablet (10 mg total) by mouth daily. 90 tablet 1    nystatin-triamcinolone (MYCOLOG II) cream Apply topically 2 times daily as needed for Skin Infection due to Candida Yeast. Apply to affected area 15 g 0    Menthol, Topical Analgesic, (ICY HOT EX) Apply 1 applicator topically as needed (pain).      Menthol, Topical Analgesic, (BIOFREEZE EX) Apply 1 applicator topically as needed (pain).      apixaban (ELIQUIS) 5 mg tablet Take 1 tablet (5 mg total) by mouth every 12 hours. 180 tablet 3    COLLAGEN PO Take 2 scoops by mouth daily. (Patient not taking: Reported on 12/04/2022)       No current facility-administered medications on file prior to visit.     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 10/22/2015    Immunocompromised 06/11/2015    Ventricular tachycardia 06/08/2015    post allogenic MUD (F)  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     Conditioned w/ Busulfan 130 mg/m2/d and Fludarabine 40 mg/m2/d x4 days followed by 10/10 HLA MUD (F)  Allogeneic PBSCT (pt/donor: ABO: A+/A+, CMV N/N)      Myeloproliferative disease 05/11/2015    Lumbar radiculopathy 01/11/2015     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    Conditioned w/ Busulfan 130 mg/m2/d and Fludarabine 40 mg/m2/d x4 days followed by 10/10 HLA MUD (F) Allogeneic PBSCT (pt/donor: ABO: A+/A+, CMV N/N)    Pulmonary edema     s/p L THA 10/20/21 10/20/2021    Shortness of breath     Tobacco use      Past Surgical History:   Procedure Laterality Date    BONE MARROW TRANSPLANT  2017    Mediport insertion      PR ARTHRP ACETBLR/PROX FEM PROSTC AGRFT/ALGRFT Left 10/20/2021    Procedure: LEFT POSTERIOR ARTHROPLASTY, HIP, TOTAL;  Surgeon: Fransisca Connors, MD;  Location: [facility];  Service: Orthopedics    PR AXILLARY LYMPHADENECTOMY COMPLETE Left 01/24/2021    Procedure: sentinel lymph node biopsy;  Surgeon: Sheliah Mends, MD;  Location: [facility];  Service: Oncology General    PR CARDIOVERSION ELECTIVE ARRHYTHMIA EXTERNAL N/A 09/24/2022    Procedure: Cardioversion;  Surgeon: Katherine Roan, NP;  Location: [facility];  Service: Cardiovascular    PR MASTECTOMY PARTIAL Left 01/24/2021    Procedure: LEFT NEEDLE LOC PARTIAL MASTECTOMY   ;  Surgeon: Sheliah Mends, MD;  Location: [facility];  Service: Oncology General    ROTATOR CUFF REPAIR Right      Family History   Problem Relation Age of Onset    No Known Problems Mother     Throat cancer  Father     Heart attack Father     Thyroid disease Sister     No Known Problems Brother     Breast cancer Paternal Aunt     Ovarian cancer Neg Hx  Anesthesia problems Neg Hx      Social History     Socioeconomic History    Marital status: Divorced   Occupational History    Occupation: on disability     Employer: [employer]   Tobacco Use    Smoking status: Former     Packs/day: 0.25     Years: 31.00     Additional pack years: 0.00     Total pack years: 7.75     Types: Cigarettes     Quit date: 11/21/2022     Years since quitting: 0.0     Passive exposure: Past    Smokeless tobacco: Never   Substance and Sexual Activity    Alcohol use: Not Currently    Drug use: No    Sexual activity: Not Currently   Social History Narrative        Marital status: single    Lives in a second floor apartment alone     No children    Pets:  1 dog    Hobbies and interests include nothing at this time    Physical activity: none at this time       Objective:     Vital Signs: BP 112/64 (BP Location: Right arm, Patient Position: Sitting)   Pulse 80   Ht 1.683 m (5' 6.25")   Wt 119.3 kg (263 lb)   SpO2 99%   BMI 42.13 kg/m    BMI: Body mass index is 42.13 kg/m.    Vision Screening Results (Welcome visit only):  No results found.    Depression Screening Results:  Review Flowsheet          12/04/2022 06/08/2022 08/31/2021   PHQ Scores   PHQ Q9 - Better Off Dead 0 0 -   PHQ Calculated Score 5 15 0      Details                 No questionnaires on file.   Opioid Use/DAST- 10 Screening Results:   No data recorded  Activities of Daily Living/Functional Screening Results:  Is the person deaf or does he/she have serious difficulty hearing?: N (12/04/2022  8:59 AM)  Hearing Status: No impairment (12/04/2022  8:59 AM)  Is this person blind or does he/she have serious difficulty seeing even when wearing glasses?: N (12/04/2022  8:59 AM)  *Vision Status: No impairment (12/04/2022  8:59 AM)  Does this person have serious difficulty  walking or climbing stairs?: N (12/04/2022  8:59 AM)  Does this person have difficulty dressing or bathing?: N (12/04/2022  8:59 AM)  *Shopping: Independent (12/04/2022  8:59 AM)  *House Keeping: Independent (12/04/2022  8:59 AM)  *Managing Own Medications: Independent (12/04/2022  8:59 AM)  *Handling Finances: Independent (12/04/2022  8:59 AM)  Difficulty doing errands due to a physicial, mental or emotional condition: No (12/04/2022  8:59 AM)  Difficulty remembering or making decisions due to a physicial, mental or emotional condition: No (12/04/2022  8:59 AM)      Fall Risk Screening Results:  Have you fallen in the last year?: No (12/04/2022  8:59 AM)  Do you feel you are at risk for falling?: No (12/04/2022  8:59 AM)      Assessment and Plan:     Cognitive Function:  Recall of recent and remote events appears:  Normal      Advanced Care Planning:  was discussed and the paperwork can be found in the scanned media section  The following health maintenance plan was reviewed with the patient:    Health Maintenance Topics with due status: Overdue       Topic Date Due    Diabetic Eye Exam ADA Never done     Health Maintenance Topics with due status: Not Due       Topic Last Completion Date    IMM DTaP/Tdap/Td 11/22/2016    Colon Cancer Screening USPSTF 05/08/2017    IMM Pneumo: Peds (0-50yrs) or At-Risk Patients (6-21yrs) 08/15/2017    Diabetic Foot Exam ADA 06/08/2022    Cervical Cancer Screening (USPSTF/ACOG) 08/09/2022    Diabetic A1C Monitoring ADA 11/13/2022    Diabetic Nephropathy Screening - Urine 11/15/2022    Diabetic Nephropathy Screening - Blood 11/22/2022    Depression Screen Monthly 12/04/2022     Health Maintenance Topics with due status: Completed       Topic Last Completion Date    HIV Screening US[place]ST[place] 04/21/2015    Hepatitis C Screening USPSTF/[place] 04/21/2015    IMM-Hepatitis B Vaccine 08/15/2017    IMM-Zoster 08/15/2017    IMM-Influenza 10/01/2022     Health Maintenance Topics with due status:  Aged [employer] Date Due    IMM-HIB 0-5 Yrs or At-Risk Patients Aged Out    IMM-HPV 9-26 Yrs or Shared Decision (27-45 Yrs) Aged Out    IMM-MCV4 0-18 Yrs or At-Risk Patients Aged Out    IMM-Rotavirus 0-8 Months Aged Out     Health Maintenance Topics with due status: Discontinued       Topic Date Due    COVID-19 Vaccine Discontinued     This health maintenance schedule, identified risks, a list of orders placed today and patient goals have been provided to Carly Higgins in the after visit summary.     Plan for any concerns identified during screening or risk assessments:  n/a

## 2022-12-04 NOTE — Patient Instructions (Addendum)
Continue current medications    I sent a letter to both you and Dr. Arlyce Dice -stating that you are optimized for surgery.    Once things have healed from her surgery make sure you consider good diet and exercise program for weight loss.  He continues on Rex.    Please call with any questions or concerns    Be well  Particia Strahm    Thank you for completing your Subsequent Annual Medicare Visit   with [place] today.     The purpose of this visits was to:    Screen for disease  Assess risk of future medical problems  Help develop a healthy lifestyle  Update vaccines  Get to know your doctor in case of an illness    Patient Care Team:  Volanda Napoleon, MD as PCP - General  Caren Griffins, RN as **Registered Nurse (RN) (Oncology)  Joeseph Amor, MD (Hematology)  Alison Murray, MD (Hematology and Oncology)  Elmo Caul Loura Back, MD (Radiation Oncology)  Sheliah Mends, MD as Referring Physician (Surgical Oncology)  Alison Stalling, MD as Consulting Provider (Cardiology)     Medicare 5 Year Plan    The following items were identified as areas of concern during your screening today:  BMI greater than 25 - This is a risk for Heart Attack, Stroke, High Blood Pressure, Diabetes, High Cholesterol and other complications.       The Health Maintenance table below identifies screening tests and immunizations recommended by your health care team:  Health Maintenance: These screening recommendations are based on USPSTF, [employer], and [place] state guidelines   Topic Date Due    Diabetic Eye Exam  Never done    Depression - Monthly  01/03/2023    Diabetic A1C Monitoring  05/13/2023    Diabetic Foot Exam  06/08/2023    Diabetic Kidney Disease Urine  11/15/2023    Diabetic Kidney Disease Blood  11/22/2023    Pneumococcal Vaccination (4 of 4 - PPSV23 or PCV20) 06/06/2025    Cervical Cancer Screening / HPV Testing  08/08/2025    DTaP/Tdap/Td Vaccines (5 - Td or Tdap) 11/23/2026    Colon Cancer Screening  05/09/2027     Hepatitis B Vaccine  Completed    Flu Shot  Completed    Shingles Vaccine  Completed    HIV Screening  Completed    Hepatitis C Screening  Completed    HIB Vaccine  Aged Out    HPV Vaccine  Aged Out    Meningococcal Vaccine  Aged Out    Rotavirus Vaccine  Aged Out    COVID-19 Vaccine  Discontinued     In addition, goals and orders placed to address these recommendations are listed in the "Today's Visit" section.    We wish you the best of health and look forward to seeing you again next year for your Annual Medicare Wellness Visit.     If you have any health care concerns before then, please do not hesitate to contact us.

## 2022-12-04 NOTE — Progress Notes (Addendum)
[facility]  [address]  [place], [place] [address]      Carly Higgins is a 62 y.o. female here for annual review of all medlcal problems and examination     INTERVAL HISTORY     Chief Complaint   Patient presents with    Subsequent Annual Medicare Visit       GENERAL SCREENING     Life Style  Regular exercise:Yes  Healthy diet:Yes    Safety  Use seatbelts: Yes  Domestic violence:No    Social Drivers of Health     Tobacco Use: Medium Risk (12/04/2022)    Patient History     Smoking Tobacco Use: Former     Smokeless Tobacco Use: Never     Passive Exposure: Past   Alcohol Use: Not on file   Financial Resource Strain: Low Risk  (12/04/2022)    Overall Financial Resource Strain (CARDIA)     Difficulty of Paying Living Expenses: Not hard at all   Food Insecurity: No Food Insecurity (12/04/2022)    Hunger Vital Sign     Worried About Running Out of Food in the Last Year: Never true     Ran Out of Food in the Last Year: Never true   Transportation Needs: No Transportation Needs (12/04/2022)    PRAPARE - Therapist, art (Medical): No     Lack of Transportation (Non-Medical): No   Physical Activity: Not on file   Stress: No Stress Concern Present (12/04/2022)    [employer] of Occupational Health - Occupational Stress Questionnaire     Feeling of Stress : Not at all   Social Connections: Moderately Integrated (12/04/2022)    Social Connection and Isolation Panel [NHANES]     Frequency of Communication with Friends and Family: More than three times a week     Frequency of Social Gatherings with Friends and Family: More than three times a week     Attends Religious Services: More than 4 times per year     Active Member of Clubs or Organizations: No     Attends Engineer, structural: More than 4 times per year     Marital Status: Divorced   Intimate Partner Violence: Not At Risk (12/04/2022)    Intimate Partner Violence     Fear of Current or Ex-Partner:  No   Depression: Not at risk (12/04/2022)    Depression     Last PHQ9 (Date Compare): 5   Housing Stability: Low Risk  (12/04/2022)    Housing Stability Vital Sign     Unable to Pay for Housing in the Last Year: No     Number of Times Moved in the Last Year: 0     Homeless in the Last Year: No   Utilities: Not At Risk (12/04/2022)    AHC Utilities     Threatened with loss of utilities: No        All history family/social/allergies/HM/PMHx/PSHx was reviewed   Medications reviewed/reconciled at today's visit Family history was reviewed at toay's visit and marked as reviewed:      Current Outpatient Medications on File Prior to Visit   Medication Sig Dispense Refill    ibuprofen (ADVIL,MOTRIN) 400 mg tablet Take 2 capsules (400 mg total) by mouth 4 times daily as needed for Pain.      Lidocaine-Menthol (NERVIVE ROLL-ON EX) Apply 1 g topically as needed. For toe pain  metoprolol succinate ER (TOPROL-XL) 200 mg 24 hr tablet Take 1 tablet (200 mg total) by mouth daily. Do not crush or chew. May be divided. 90 tablet 3    ondansetron (ZOFRAN-ODT) 4 MG disintegrating tablet Take 1 tablet (4 mg total) by mouth 3 times daily as needed. Place on top of tongue. 30 tablet 0    pregabalin (LYRICA) 100 mg capsule TAKE 1 CAPSULE(100 MG) BY MOUTH TWICE DAILY FOR NERVE PAIN. MAX DAILY DOSE: 200 MG 60 capsule 2    acyclovir (ZOVIRAX) 400 mg tablet TAKE 1 TABLET(400 MG) BY MOUTH TWICE DAILY 180 tablet 3    tirzepatide (MOUNJARO) 5 MG/0.5ML pen Inject 0.5 mLs (5 mg total) into the skin every 7 days. 2 mL 6    levothyroxine (SYNTHROID, LEVOTHROID) 50 mcg tablet TAKE 1 TABLET BY MOUTH EVERY DAY 90 tablet 5    cyanocobalamin (VITAMIN B-12) 500 mcg tablet Take 1 tablet (500 mcg total) by mouth daily for Inadequate Vitamin B12, peripheral neuropathy. 100 tablet 5    atorvastatin (LIPITOR) 40 mg tablet TAKE 1 TABLET BY MOUTH IN THE EVENING 90 tablet 5    escitalopram (LEXAPRO) 10 mg tablet Take 1 tablet (10 mg total) by mouth daily. 90  tablet 1    nystatin-triamcinolone (MYCOLOG II) cream Apply topically 2 times daily as needed for Skin Infection due to Candida Yeast. Apply to affected area 15 g 0    Menthol, Topical Analgesic, (ICY HOT EX) Apply 1 applicator topically as needed (pain).      Menthol, Topical Analgesic, (BIOFREEZE EX) Apply 1 applicator topically as needed (pain).      apixaban (ELIQUIS) 5 mg tablet Take 1 tablet (5 mg total) by mouth every 12 hours. 180 tablet 3    COLLAGEN PO Take 2 scoops by mouth daily. (Patient not taking: Reported on 12/04/2022)       No current facility-administered medications on file prior to visit.       REVIEW OF SYSTEMS   Review of Systems   Constitutional: Negative.    HENT: Negative.     Eyes: Negative.    Respiratory: Negative.     Cardiovascular: Negative.    Gastrointestinal: Negative.    Genitourinary: Negative.    Musculoskeletal:  Positive for joint pain.        Hip pain    Skin: Negative.    Neurological: Negative.    Endo/Heme/Allergies: Negative.    Psychiatric/Behavioral: Negative.         PHYSICAL EXAM     BP 112/64 (BP Location: Right arm, Patient Position: Sitting)   Pulse 80   Ht 1.683 m (5' 6.25")   Wt 119.3 kg (263 lb)   SpO2 99%   BMI 42.13 kg/m   Body mass index is 42.13 kg/m.  Physical Exam  Constitutional:       General: She is not in acute distress.     Appearance: Normal appearance. She is obese. She is not ill-appearing or toxic-appearing.   HENT:      Head: Normocephalic and atraumatic.      Right Ear: Tympanic membrane, ear canal and external ear normal.      Left Ear: Tympanic membrane, ear canal and external ear normal.      Nose: Nose normal.      Mouth/Throat:      Mouth: Mucous membranes are moist.      Pharynx: Oropharynx is clear.   Eyes:      Extraocular Movements: Extraocular movements intact.  Conjunctiva/sclera: Conjunctivae normal.      Pupils: Pupils are equal, round, and reactive to light.   Neck:      Vascular: No carotid bruit.   Cardiovascular:       Rate and Rhythm: Normal rate and regular rhythm.      Pulses:           Dorsalis pedis pulses are 1+ on the right side and 1+ on the left side.        Posterior tibial pulses are 1+ on the right side and 1+ on the left side.      Heart sounds: Normal heart sounds.   Pulmonary:      Effort: Pulmonary effort is normal.      Breath sounds: Normal breath sounds.   Chest:   Breasts:     Right: Normal.      Comments: Scar of the left breast from previous surgery  Abdominal:      General: Abdomen is flat. Bowel sounds are normal. There is no distension.      Palpations: Abdomen is soft. There is no mass.      Tenderness: There is no abdominal tenderness. There is no right CVA tenderness, left CVA tenderness, guarding or rebound.      Hernia: No hernia is present.   Genitourinary:     Comments: deferred  Musculoskeletal:         General: Normal range of motion.      Cervical back: Normal range of motion and neck supple. No tenderness.      Right lower leg: No edema.      Left lower leg: No edema.      Comments: Significant hip arthritis  Pain   Feet:      Right foot:      Skin integrity: Skin integrity normal.      Left foot:      Skin integrity: Skin integrity normal.      Comments: Diabetic foot exam was performed:  Yes. Monofilament sensation was Normal on the L foot and Normal on the R foot. Vibratory sensation was Normal on the L foot and Normal on the R foot.       Oa changes  Dry skin     Lymphadenopathy:      Head:      Right side of head: No submental or submandibular adenopathy.      Left side of head: No submental or submandibular adenopathy.      Cervical: No cervical adenopathy.      Upper Body:      Right upper body: No supraclavicular, axillary or pectoral adenopathy.      Left upper body: No supraclavicular, axillary or pectoral adenopathy.      Lower Body: No right inguinal adenopathy. No left inguinal adenopathy.   Skin:     General: Skin is warm and dry.   Neurological:      General: No focal deficit  present.      Mental Status: She is alert and oriented to person, place, and time. Mental status is at baseline.      Sensory: No sensory deficit.      Motor: No weakness.      Coordination: Coordination normal.      Gait: Gait normal.      Deep Tendon Reflexes: Reflexes normal.   Psychiatric:         Mood and Affect: Mood normal.         Behavior: Behavior normal.  Thought Content: Thought content normal.         Judgment: Judgment normal.           ASSESSMENT AND PLAN OF CARE   Carly Higgins  was seen today for annual exam.      Screening  BP / LABS discussed  Lipid screening reviewed and discussed  Colon cancer screening discussed  Mammogram discussed  Osteoporosis prevention reviewed and discussed  Counseled on regular exercise and a healthy high fiber diet  Counseled regarding safety, seat belts, helmets, and risky activities.  Advised updated gyn, dental and eye exams  Skin CA awareness/prevention discussed  Immunizations reviewed  Advised Smoke detectors, Government social research officer, [place] detector    Health Care Proxy / Living Will   Health Care proxy discussed    Social Determinants of Health  Reviewed today and no needs identified       Hypothyroidism, unspecified type (Primary)  Continue levothyroxine as  Continue TSH monitoring    Type 2 diabetes mellitus with diabetic neuropathy, without long-term current use of insulin  Diet and exercise  BG control  BG testing  Diabetic eye exam note sent to [place] from eye doctor that she saw this year.  Continue Mounjaro    Paroxysmal atrial fibrillation  Rate controlled  Continue Eliquis  Cardiology follow-up  Diet and exercise  Lifestyle modification    Gastroesophageal reflux disease without esophagitis  Diet and exercise controlled  No acute issues    Preventative health care  Updated vaccinations  Up-to-date on health maintenance/screenings    Obsessive compulsive personality disorder  Anxiety  Moderate recurrent major depression  Adjustment disorder with mixed anxiety  and depressed mood  Continue current medications  Well-controlled  Follow-up with therapy    Obesity  Diet and exercise weight loss  Mounjaro  Await surgery response    Malignant neoplasm of upper-outer quadrant of left breast in female, estrogen receptor negative  In remission  Stable    Neuropathy due to chemotherapeutic drug  Continue Lyrica    Myeloproliferative disease  post allogenic MUD (F)  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  No new changes  Stable  Hematology follow-up    Ventricular tachycardia  Continue metoprolol  Cardiology follow-up    Primary osteoarthritis involving multiple joints  Lumbar radiculopathy  Bilateral hip pain.  Surgery planned for hip replacement  Continue supportive management  Will need physical therapy program  Weight loss will help her dramatically  Symptomatic care      Lars Pinks, MD  12/04/2022 9:11 AM

## 2022-12-05 ENCOUNTER — Ambulatory Visit: Payer: Medicare Other | Admitting: Thoracic Diseases

## 2022-12-06 NOTE — Addendum Note (Signed)
Addended by: Lars Pinks on: 12/06/2022 07:21 AM     Modules accepted: Level of Service

## 2022-12-11 NOTE — Progress Notes (Signed)
[facility]    [facility]  Patient Name: Carly Higgins  MRN: [id number]  DOB: Jun 29, 1960  Date of Service: 12/12/2022  Provider Name: Rosalita Levan PA-C    HPI: Carly Higgins is a lovely 62 y.o. woman seen on 12/12/2022  at [facility] for routine follow-up. She is currently 1 year and 11 months S/P  seed loc partial mastectomy and sentinel lymph node biopsy  for  Cancer Staging   IDC left 2 o'clock 2.5 cm, ER/PR- her 2-  Staging form: Breast, AJCC 8th Edition  - Clinical stage from 06/15/2020: Stage IIB (cT2, cN0, cM0, G3, ER-, PR-, HER2-) - Signed by Sheliah Mends, MD on 06/15/2020  - Pathologic stage from 02/07/2021: No Stage Recommended (ypT0, pN0, cM0, GX) - Signed by Sheliah Mends, MD on 02/07/2021    She completed her chemotherapy in 2022. She then went on to receive  breast radiation [facility]), completed 05/10/2021. She states that she overall is doing better than the last visit. She is scheduled to have her other hip replaced next Friday. Today she feels well. She denies any new breast concerns including: palpable breast findings, nipple discharge, skin changes or breast pain. She complains of hot flashes. She rates her cosmetic satisfaction as 4/5 (Satisfid). She denies arm issues including heaviness, swelling, infections, changes in how her clothes or jewelry fit, range of motion issues.    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    In February 2022 she began to experience discomfort in the left breast.  She first attributed this to having been struck with a part of her dog when she picked it up about 1 week earlier.  She continued to experience discomfort intermittently. She scheduled a screening mammogram, having not had an exam since July 2018.         IDC left 2 o'clock 2.5 cm, ER/PR- her  2-   05/18/2020 Significant Radiology Findings    Screening [place]: scattered tissue, architectural distortion left 2 o'clock. BIRAD 0       06/07/2020 Significant Radiology Findings    Ultrasound [place]: irregular hypoechoic mass left 2 o'clock, 19x19x12 mm, 25 mm radial. middle third upper outer quadrant of the left breast. BIRAD 4  Ultrasound guided core IDC     06/09/2020 Procedure    ultrasound-guided core biopsy of the left breast 2:00 lesion:   Biopsy revealed invasive ductal carcinoma.  Provisional combined Nottingham grade was 3 of 3 (tubule formation score 3, nuclear grade 3, mitotic rate score 2).  Tumor cells were negative for estrogen receptors and progesterone receptors.  Tumor cells were negative for HER2 by immunohistochemical staining with a staining score of 1+.  Ki-67 staining was seen in approximately 70% of tumor cells.        06/13/2020 Consultation    She was seen in the Hereditary Oncology clinic :  Because of her prior allogeneic bone marrow transplant, she was not eligible for standard germline testing with blood or saliva specimen.       06/15/2020 Initial Diagnosis    IDC left 2 o'clock 2.5 cm, ER/PR- her 2-     06/15/2020 Cancer Staged    Staging form: Breast, AJCC 8th Edition  - Clinical stage from 06/15/2020: Stage IIB (cT2, cN0, cM0, G3, ER-, PR-, HER2-)     06/15/2020 Consultation  Surgical consultation:  Dr. Claris Gower      06/20/2020 Significant Radiology Findings    Whole-body bone scan : No abnormal focus of radiotracer uptake to suggest osseous metastatic disease.     06/22/2020 Significant Radiology Findings    CT of the chest: mill-defined enhancing lesion in the lateral left breast measuring 1.7 x 1.2 x 1.1 cm.  There were asymmetrically prominent level 1 left axillary lymph nodes, enhancement of uncertain significance, possibly representing metastatic disease. No enlarged mediastinal, hilar, intramammary,  supraclavicular lymph nodes or suspicious pulmonary nodules were  identified.       06/22/2020 Significant Radiology Findings    CT of the abdomen pelvis : no evidence of metastatic disease . small fat filled umbilical hernia noted.     06/28/2020 Significant Radiology Findings    MRI of the breast: irregular mass with spiculated margins measuring 10 x 25 x 19 mm in the left breast upper outer quadrant at the 2 o'clock position located 3 cm from the nipple.      07/15/2020 - 12/08/2020 Chemotherapy    Began cycle 1 of weekly paclitaxel and carboplatin on July 15, 2020.  Immunotherapy with pembrolizumab was not recommended due to her previous allogenic bone marrow transplant.  Final cycle (4) of paclitaxel and carboplatin was administered on September 30, 2020.  Began dose dense cyclophosphamide and doxorubicin on October 06, 2020    OP Breast CARBOplatin Weekly Paclitaxel then [facility]   Plan Provider: Alison Murray, MD  Treatment goal: Neoadjuvant  Line of treatment: [No plan line of treatment]     08/18/2020 Procedure    PowerPort in the right internal jugular vein.     12/21/2020 Significant Radiology Findings    Left breast mammogram and ultrasound on December 21, 2020:  Impression:  Mass in the left breast upper outer quadrant at 2 o'clock located 8 centimeters from the nipple is a known malignancy. Surgical consultation is recommended. Patient completed chemotherapy treatment on 12/08/2020. Patient is scheduled for left lumpectomy on 01/24/2021.     Finding 2:  Normal lymph node and biopsy clip in the left axilla is benign.     Finding 3:  Stable calcifications in the left breast are benign.        01/04/2021 Significant Radiology Findings    MRI breasts on December 15, 2020:  Previously seen irregular spiculated enhancing mass in the left breast does not demonstrate significant residual enhancement. A biopsy clip is noted along the medial margin of the previously seen mass.  There is no axillary or internal mammary lymphadenopathy. In the right breast, there is no evidence of any mass  or suspicious enhancements.  Impression:  Area in the left breast is a known malignancy. No significant residual enhancement in the area of previously seen mass, consistent with excellent/complete radiologic response.        01/24/2021 Surgery    Wire localized partial mastectomy and sentinel lymph node biopsy- Sheliah Mends    Complete pathologic response.      02/07/2021 Cancer Staged    Staging form: Breast, AJCC 8th Edition  - Pathologic stage from 02/07/2021: No Stage Recommended (ypT0, pN0, cM0, GX)         PAST MEDICAL AND SURGICAL HISTORY, SOCIAL AND FAMILY HISTORY: These were documented on the patient intake form and reviewed with the patient and notes no significant changes since her last visit with [place].     MEDICATIONS:   Current Outpatient Medications   Medication Sig Note  ibuprofen (ADVIL,MOTRIN) 400 mg tablet Take 2 capsules (400 mg total) by mouth 4 times daily as needed for Pain.     Lidocaine-Menthol (NERVIVE ROLL-ON EX) Apply 1 g topically as needed. For toe pain     metoprolol succinate ER (TOPROL-XL) 200 mg 24 hr tablet Take 1 tablet (200 mg total) by mouth daily. Do not crush or chew. May be divided. 11/22/2022: Takes half tablet in morning and night    ondansetron (ZOFRAN-ODT) 4 MG disintegrating tablet Take 1 tablet (4 mg total) by mouth 3 times daily as needed. Place on top of tongue.     pregabalin (LYRICA) 100 mg capsule TAKE 1 CAPSULE(100 MG) BY MOUTH TWICE DAILY FOR NERVE PAIN. MAX DAILY DOSE: 200 MG     acyclovir (ZOVIRAX) 400 mg tablet TAKE 1 TABLET(400 MG) BY MOUTH TWICE DAILY     tirzepatide (MOUNJARO) 5 MG/0.5ML pen Inject 0.5 mLs (5 mg total) into the skin every 7 days. 11/22/2022: monday    levothyroxine (SYNTHROID, LEVOTHROID) 50 mcg tablet TAKE 1 TABLET BY MOUTH EVERY DAY 11/23/2022: A.M.    cyanocobalamin (VITAMIN B-12) 500 mcg tablet Take 1 tablet (500 mcg total) by mouth daily for Inadequate Vitamin B12, peripheral neuropathy.     atorvastatin (LIPITOR) 40 mg tablet TAKE 1  TABLET BY MOUTH IN THE EVENING     escitalopram (LEXAPRO) 10 mg tablet Take 1 tablet (10 mg total) by mouth daily. 11/23/2022: evening    nystatin-triamcinolone (MYCOLOG II) cream Apply topically 2 times daily as needed for Skin Infection due to Candida Yeast. Apply to affected area     Menthol, Topical Analgesic, (ICY HOT EX) Apply 1 applicator topically as needed (pain).     Menthol, Topical Analgesic, (BIOFREEZE EX) Apply 1 applicator topically as needed (pain).     apixaban (ELIQUIS) 5 mg tablet Take 1 tablet (5 mg total) by mouth every 12 hours.     COLLAGEN PO Take 2 scoops by mouth daily. (Patient not taking: Reported on 12/12/2022) 11/23/2022: A.M.     No current facility-administered medications for this visit.       ALLERGIES: has No Known Allergies (drug, envir, food or latex).    REVIEW OF SYSTEMS: Comprehensive ROS was taken on the patient intake form and reviewed with the patient.  Pertinent items are noted in HPI.    PHYSICAL EXAMINATION:  Vitals: BP 117/65   Pulse 73   Temp 35.6 C (96.1 F) (Temporal)   Wt 120 kg (264 lb 7.1 oz)   SpO2 97%   BMI 42.36 kg/m   General appearance: alert, appears stated age, and cooperative  Head: Normocephalic, without obvious abnormality, atraumatic  Neck: no adenopathy, no carotid bruit, no JVD, supple, symmetrical, trachea midline, and thyroid not enlarged, symmetric, no tenderness/mass/nodules  Back: symmetric, no curvature. ROM normal. No CVA tenderness.  Breasts: Comprehensive breast examination was performed in the seated and supine positions. The breasts are of moderate size and moderately ptotic/pendulous. The breasts are symmetric with normal shape and contour. The skin is without erythema, edema, nodules or other lesions. Nipples are normal and everted.There are no skin changes on arm maneuvers. On palpation there are no masses palpable in either breast. No discharge is expressible.   Abdomen: Soft, nontender, nondistended, no organomegaly, no palpable  masses.  Extremities: extremities normal, atraumatic, no cyanosis or edema. There is not evidence of lymphedema. ROM is within normal limits  Pulses: 2+ and symmetric  Skin: Skin color, texture, turgor normal. No rashes or lesions  Lymph nodes: Cervical, supraclavicular, and axillary nodes normal.  Neurologic: Grossly normal      LABS:    Lab Results   Component Value Date/Time    VID25 27 (L) 05/03/2017 08:16 AM       IMAGING:  09/17/2022  MAMMOGRAM FINDINGS:   There are scattered areas of fibroglandular densities.     Finding 1:  There are stable round calcifications seen in the lateral right breast.     Finding 2:  There is a post-lumpectomy scar seen in the anterior upper outer quadrant of the left breast.  Post lumpectomy surgical clips are present at the site. There is also a surgical clip in the left axilla.     An asymmetry seen just medial to the scar is stable.     No new or suspicious masses, calcifications, asymmetries or architectural distortion is seen.     ASSESSMENT: Carly Higgins is a 62 y.o. female now 2 years 11 months s/p Left partial mastectomy with SLNBx . There is no evidence of local regional recurrence at the visit today. Her next bialteral mammogram is due in September at [facility].    PLAN:  1) Surveillance plan: Carly Higgins continues to be seen on an every 6 month basis with bilateral mammogram annually.     Medical oncology: Caren Macadam NP 05/20/2023  Radiation oncology: N/A    She declined Survivorship at this time    2)Ordered her annual breast imaging to be done next Sept.     3) noted to have some mild yeast under her left breast, instructed to apply her cream that she already has at home    4) Discussed being breast aware and to let us know of any changes and that she would be seen by [place] again.      Rosalita Levan, [place]  NPI # [id number]  Pager # [phone]

## 2022-12-12 ENCOUNTER — Other Ambulatory Visit: Payer: Self-pay

## 2022-12-12 ENCOUNTER — Ambulatory Visit: Payer: Medicare Other | Attending: General Surgery | Admitting: General Surgery

## 2022-12-12 VITALS — BP 117/65 | HR 73 | Temp 96.1°F | Wt 264.4 lb

## 2022-12-12 DIAGNOSIS — C50412 Malignant neoplasm of upper-outer quadrant of left female breast: Secondary | ICD-10-CM | POA: Insufficient documentation

## 2022-12-12 DIAGNOSIS — Z171 Estrogen receptor negative status [ER-]: Secondary | ICD-10-CM | POA: Insufficient documentation

## 2022-12-12 DIAGNOSIS — Z01818 Encounter for other preprocedural examination: Secondary | ICD-10-CM

## 2022-12-12 NOTE — Progress Notes (Signed)
Preop smoking labs (set #2) ordered, to be collected 1 week prior to surgery date on 12/21/22

## 2022-12-13 ENCOUNTER — Ambulatory Visit: Payer: Medicare Other | Admitting: Orthopedic Surgery

## 2022-12-13 VITALS — BP 102/80 | HR 88 | Resp 16 | Ht 66.25 in | Wt 264.6 lb

## 2022-12-13 DIAGNOSIS — M1611 Unilateral primary osteoarthritis, right hip: Secondary | ICD-10-CM

## 2022-12-13 NOTE — Progress Notes (Signed)
Total Hip Arthroplasty Preoperative visit, Surgical Discussion    Carly Higgins presents for preoperative consent visit for upcoming right total hip arthroplasty. She continues to experience debilitating pain in the hip refractory to conservative treatment interventions. It is significantly impacting their quality of life, overall function, and ability to complete activities of daily living and/or participate in activities of enjoyment and recreation. 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    Refer to previous office visit notes and documentation for physical examination findings.  There are no new updates to the patient's physical examination today.     We once again reviewed that the proposed surgery is ideal for treating the pain from inflammation due to joint pathology and that other extra-articular sources of pain may not be fully relieved after full recovery.  Improvements in range of motion, joint stability, and alignment are secondary reasons to perform this surgery, and improvements in these domains are less predictable. Furthermore, we reviewed the long term implications of undergoing joint replacement including the importance of future activity modifications to avoid high impact activity to maximize implant longevity.  Preoperative preparation, perioperative scheduling, postoperative expectations and rehabilitation were all reviewed in the office today.  She has completed joint replacement education class and is fully informed on preoperative preparation, what to expect on the day of surgery, and expectations about the postoperative hospital stay and recovery from the operation.  She has completed coordinated preoperative medical evaluation with their primary care physician, any necessary medical specialists, and our presurgical testing team and obtained clearance to proceed with the proposed surgery.     The risks and proposed benefits of a total  hip arthroplasty were discussed with the patient in detail.  The benefits involve relief of pain and the associated improvement in quality of life as a result of the pain relief.  Improvement in hip range-of-motion or stiffness may or may not be attained and is less predictable.  The risks of the surgery were detailed to the patient and include bleeding, wound healing complications, venous thromboembolic disease (DVT/PE), neural deficit or injury, vascular injury, injury to other soft tissue structures (muscle/tendon/ligament) resulting in instability, pain or gait abnormality (i.e. hip abductor complex), prosthetic joint instability or dislocation, periprosthetic fracture, deep prosthetic joint infection, heterotopic ossification, implant wear, osteolysis due to bearing wear, implant loosening, other modes of implant failure (fracture, dissociation or corrosion), persistent pain, need for future reoperation.  Medical risks were also explained to the patient and include cardiopulmonary, renal, gastrointestinal and neurological (cognitive) complications.  The patient comprehends and understands the risks clearly and wishes to proceed with the indicated total hip arthroplasty.      Risk stratification preoperatively for selection of postoperative anticoagulation to reduce their risk of deep vein thrombosis and pulmonary embolism has been or will be coordinated with the patient's care team.  Anticoagulation choices are made based on recommendations from the American Academy of Orthopedic Surgeons guidelines and the American College of Chest Physician guidelines.  For patients at a normal risk, we use aspirin postoperatively to prevent blood clots.  This provides appropriate risk reduction while decreasing the risk of postoperative bleeding and hematoma.  The risk of venous thromboembolism is weighed on an individual case by case basis, and is determined based on well studied individual risk factors.  We also reviewed  that scarring around the incision is normal and that there may be some numbness or tenderness around the incision.     We  discussed the variety of technical strategies employed to perform this procedure, as well as risks and benefits specific to each particular strategy including specific surgical approaches (anterior vs posterior hip approaches), alignment and implant fixation strategies, the intraoperative use of technology (fluoroscopy, computer navigation, robotic arm assistance, augmented/mixed reality).  We reviewed modern research and evidence assessing the impact of these various techniques on outcomes from the procedure.  We discussed the evidence on arthroplasty bearing surfaces and the risks/benefits to the various combinations of materials available.  Implant selection discussed, along with the proposed fixation strategy and use of technology, if applicable.       Based on the above discussion, informed consent to proceed with the proposed operation was reviewed today and submitted electronically, to be printed and signed on the day of surgery.  All questions regarding the procedure were discussed and answered to the patient's satisfaction.    Fransisca Connors, MD  Division of Adult Reconstruction  [facility]  Department of Orthopaedics and Rehabilitation

## 2022-12-13 NOTE — Invasive Procedure Plan of Care (Signed)
CONSENT FOR MEDICAL  OR SURGICAL PROCEDURE                            Patient Name: Carly Higgins  [facility] [address]                                                              DOB: 07-30-1960         Please read this form or have someone read it to you.   It's important to understand all parts of this form. If something isn't clear, ask [place] to explain.   When you sign it, that means you understand the form and give [place] permission to do this surgery or procedure.     I agree for Fransisca Connors, MD along with any assistants* they may choose, to treat the following condition(s): Pain and inflammation of the hip joint   By doing this surgery or procedure on me: Complete replacement of the hip joint   This is also known as: Total Hip Arthroplasty   Laterality: Right     *if you'd like a list of the assistants, please ask. We can give that to you.    1. The care provider has explained my condition to me. They have told me how the procedure can help me. They have told me about other ways of treating my condition. I understand the care provider cannot guarantee the result of the procedure. If I don't have this procedure, my other choices are: No surgery, conservative treatment    2. The care provider has told me the risks (problems that can happen) of the procedure. I understand there may be unwanted results. The risks that are related to this procedure include: Bleeding, failure of wound healing, abnormal blood clot formation in extremities or lungs (DVT/PE), nerve deficit (injury or temporary loss of function resulting in weakness, numbness), blood vessel injury, instability or dislocation of joint, fracture or break of bone around the implant, damage to muscles/tendons resulting in weakness or gait abnormality, deep infection of the joint, abnormal formation of bone in tissues resulting in stiffness or pain, implant wear, implant loosening, other modes of implant failure (fracture, failure of junctions), bone loss, leg  length discrepancy (one leg longer or shorter than the other), persistent pain, potential loss of limb structure or function especially in the setting of nerve or blood vessel injury, need for reoperation or revision.    Medical risks of anesthesia and surgery include cardiopulmonary (heart and lung), renal (kidney), gastrointestinal (stomach and bowels), neurological (brain, nerve) complications such as stroke, heart attack, and even death.    3. I understand that during the procedure, my care provider may find a condition that we didn't know about before the treatment started. Therefore, I agree that my care provider can perform any other treatment which they think is necessary and available.    4. I give permission to the hospital and/or its departments to examine and keep tissue, blood, body parts, fluids or materials removed from my body during the procedure(s) to aid in diagnosis and treatment, after which they may be used for scientific research or teaching by appropriate persons. If these materials are used for science or teaching, my identity will be protected.  I will no longer own or have any rights to these materials regardless of how they may be used.    5. My care provider might want a representative from a medical device company to be there during my procedure. I understand that person works for:  [employer]        The ways they might help my care provider during my procedure include:        Helping the operating room staff prepare the special device my care provider wants to use., Providing information and support to operating room staff regarding the device. and Providing information and support to operating room staff regarding the device    6. Here are my decisions about receiving blood, blood products, or tissues. I understand my decisions cover the time before, during and after my procedure, my treatment, and my time in the hospital. After my procedure, if my condition  changes a lot, my care provider will talk with me again about receiving blood or blood products. At that time, my care provider might need me to review and sign another consent form, about getting or refusing blood.    I understand that the blood is from the community blood supply. Volunteers donated the blood, the volunteers were screened for health problems. The blood was examined with very sensitive and accurate tests to look for hepatitis, HIV/AIDS, and other diseases. Before I receive blood, it is tested again to make sure it is the correct type.    My chances of getting a sickness from blood products are small. But no transfusion is 100% safe. I understand that my care provider feels the good I will receive from the blood is greater than the chances of something going wrong. My care provider has answered my questions about blood products.      My decision  about blood or  blood products   Yes, I agree to recieve blood or blood products if my care provider thinks they're needed.        My decision   about tissue  Implants     Yes, I agree to recieve tissue implants if my care provider thinks they're needed.          I understand this  form.    My care provider  or his/her  assistants have  explained:   What I am having done and why I need it.  What other choices I can make instead of having this done.  The benefits and possible risks (problems) to me of having this done.  The benefits and possible risks (problems) to me of receiving transplants, blood, or blood products.  There is no guarantee of the results.  The care provider may not stay with me the entire time that I am in the operating or procedure room.  My provider has explained how this may affect my procedure. My provider has answered my  questions about this.         I give my  permission for  this surgery or  procedure.            _______________________________________________                                     My signature  (or parent or other  person authorized to sign for you, if you are unable to sign for  yourself or if you are under  58 years old)        ______           Date        _____        Time   Electronic Signatures will display at the bottom of the consent form.    Care provider's statement: I have discussed the planned procedure, including the possibility for transfusion of blood  products or receipt of tissue as necessary; expected benefits; the possible complications and risks; and possible alternatives  and their benefits and risks with the patients or the patient's surrogate. In my opinion, the patient or the patient's surrogate  understands the proposed procedure, its risks, benefits and alternatives.              Electronically signed by: Fransisca Connors, MD                                                12/13/2022         Date        9:05 AM        Time

## 2022-12-13 NOTE — Patient Instructions (Signed)
Joint Replacement Pre-Operative Instructions    As you prepare in the final days before surgery:    **Please follow the instructions below to read through the surgical consent form:    Log into [facility] MyChart using the application on your smartphone or a web browser.  Navigate to "Appointments and Visits".  Find the appointment that was scheduled with Dr. Kaplan (today) for your preoperative visit.  Choose the "View notes" option.  The consent form will be labeled "Invasive Procedure Plan of Care", please select this and read through it fully  If you have any questions about the information on the consent form, or have difficulty interpreting or understanding the information, please contact the office via phone or MyChart message, or bring questions with you on the day of your surgery.      Please contact Dr. Kaplan and his team at the office ([phone]), or via MyChart message if you have any further questions or concerns in the days leading up to your surgery.       A few other guidelines to note before surgery:    Treating pain after hip/knee replacement:   Anti-inflammatory medications: The anti-inflammatory (Ibuprofen, Naproxen, Celecoxib) can continue to be taken on an as needed basis for pain control if allowed by your primary care physician.  The side effects of non-steroidal, anti-inflammatory pain relievers (NSAIDs) include:  Stomach pain, heartburn (these medications should be taken with meals or on a full stomach)  Stomach ulcers  Thinning of the blood (use caution if you already take blood thinners for other medical reasons)  Allergic reactions: rashes  Kidney problems (do not take these medications if you have a history of kidney disease)  High blood pressure  Warning: If you experience any of the above side effects, stop taking the medication and notify your doctor immediately.    Acetaminophen (Tylenol):  This medication is an effective over the counter pain medicine. It can be combined with the  above anti-inflammatory medication as it has a different mechanism of action and different side effect profile. Taking both of these medications together is more effective than taking either medication by itself.  Use caution not to exceed 3,000-4,000 mg of Acetaminophen in a single 24 hour time period as it can increase your risk of liver-related side effects.  Neuroactive medications: Medications that target nerve-related pain such as gabapentin (Neurontin) or pregabalin (Lyrica) can sometimes be helpful.  Discuss these options with your surgeon, if your pain seems to be nerve-related.  These medications do have side effects including drowsiness, dizziness, dry mouth, and they are metabolized by your liver.  Otherwise, they are relatively well tolerated medications.  Opioid (narcotic) medications: These medications are reserved for immediate postoperative pain, and are not generally recommended for persistent pain and discomfort after your recovery from joint replacement.  They have several side effects, and are highly addictive especially when used for long periods of time.  Moreover, the longer they are used, the less effective they become as your body develops tolerance to these medications.  For several reasons, you should avoid taking opioid medications for prolonged periods of time.  If you continue to have pain following your joint replacement with an unclear source, these medications should be a last resort, and should only be part of a multimodal plan to manage your pain once it has been evaluated and treated by a pain management specialist.    Assist Devices: If you have problems with balance you should continue with the assist   device to be safe.  It is okay to continue using a walker or cane if you are concerned about frequent falls.  An injury from a fall can be devastating for your overall health at any time!    Normal Activities & Exercise:  Exercising regularly, stationary biking, elliptical  machines or swimming are great exercises to minimize traumatic impact to your new joint replacement, while building stamina, endurance and cardiovascular health. Keep in mind that your joint has been replaced with mechanical parts. It requires regular maintenance and care. Your muscles, tendons, ligaments all need to be regularly stretched and strengthened to maintain optimal function of your joint replacement.  Regular physical activity is paramount in maintaining a healthy lifestyle!    The American Association of Hip and Knee Surgeons has these recommendations:  - Practicing low impact exercises, activities, and sports is fine and encouraged following joint replacement.  - Intermediate impact sports are recommended if you have participated in those sports previously. If not, they should not be initiated after joint replacement.  - In general, it is recommended not to participate in high impact activities and sports as many of these can be damaging to the joint replacement parts. If you do elect to    participate in these types of activities, use moderation and always be cautious and protective of your joint replacement!    Going to the Dentist: Do not resume dental work, including routine cleanings, until three months from your surgery date. The American Academy of Orthopedic Surgeons no longer recommends routine antibiotic use before invasive dental procedures.  In some select patients, there may be a benefit to taking preventative antibiotics if risk is high enough.  If you meet any of the below criteria, you may benefit from antibiotics before these procedures, but discuss with your orthopedic surgeon:   You are severely immunocompromised (on chemotherapy or long term oral steroids)  You have previously been treated with surgery for an infection of a replaced joint  You have poorly controlled diabetes (Hemoglobin A1c > 8)    Traveling after joint replacement:  Your implant is likely to set off the metal  detectors in airport security. If you notify the TSA agents prior to screening, they may elect screen using a body scanner to avoid any further extensive screening (i.e. pat down).       Dr. Kaplan's regular follow up schedule after surgery:  -           4 weeks after surgery - 1st postoperative visit, this appointment will be scheduled with either Scott McMorris, NP or Dr. Kaplan.  -           3-4 months after surgery - 2nd routine postoperative visit  -           6 months after surgery - optional 3rd routine postoperative visit  -           1 year after surgery - final routine postoperative visit before long term follow-up     It is very important that you reach out and potentially schedule a follow up visit if you begin to experience any difficulty or trouble with your replaced joint in between scheduled postoperative appointments.

## 2022-12-14 ENCOUNTER — Other Ambulatory Visit
Admission: RE | Admit: 2022-12-14 | Discharge: 2022-12-14 | Disposition: A | Discharge status: Home / Self Care | Payer: Medicare Other | Source: Ambulatory Visit

## 2022-12-14 ENCOUNTER — Ambulatory Visit: Payer: Medicare Other | Admitting: General Surgery

## 2022-12-14 DIAGNOSIS — Z171 Estrogen receptor negative status [ER-]: Secondary | ICD-10-CM | POA: Insufficient documentation

## 2022-12-14 DIAGNOSIS — C50412 Malignant neoplasm of upper-outer quadrant of left female breast: Secondary | ICD-10-CM | POA: Insufficient documentation

## 2022-12-14 DIAGNOSIS — Z01818 Encounter for other preprocedural examination: Secondary | ICD-10-CM | POA: Insufficient documentation

## 2022-12-14 LAB — MULTIPLE ORDERING DOCS

## 2022-12-14 LAB — CBC AND DIFFERENTIAL
Baso # K/uL: 0.1 10*3/uL (ref 0.0–0.2)
Eos # K/uL: 0.3 10*3/uL (ref 0.0–0.5)
Hematocrit: 39 *Unknown (ref 34–49)
Hemoglobin: 12.2 g/dL (ref 11.2–16.0)
IMM Granulocytes #: 0 10*3/uL (ref 0.0–0.0)
IMM Granulocytes: 0.2 *Unknown
Lymph # K/uL: 1.6 10*3/uL (ref 1.0–5.0)
MCV: 94 fL (ref 75–100)
Mono # K/uL: 0.5 10*3/uL (ref 0.1–1.0)
Neut # K/uL: 3 10*3/uL (ref 1.5–6.5)
Nucl RBC # K/uL: 0 10*3/uL (ref 0.0–0.0)
Nucl RBC %: 0 /100{WBCs} (ref 0.0–0.2)
Platelets: 197 10*3/uL (ref 150–450)
RBC: 4.1 MIL/uL (ref 4.0–5.5)
RDW: 16.6 *Unknown — ABNORMAL HIGH (ref 0.0–15.0)
Seg Neut %: 55.1 *Unknown
WBC: 5.5 10*3/uL (ref 3.5–11.0)

## 2022-12-14 LAB — COMPREHENSIVE METABOLIC PANEL
ALT: 25 U/L (ref 0–35)
AST: 29 U/L (ref 0–35)
Albumin: 4.3 g/dL (ref 3.5–5.2)
Alk Phos: 78 U/L (ref 35–105)
Anion Gap: 10 *Unknown (ref 7–16)
Bilirubin,Total: 0.5 mg/dL (ref 0.0–1.2)
CO2: 27 mmol/L (ref 20–28)
Calcium: 9.2 mg/dL (ref 8.6–10.2)
Chloride: 103 mmol/L (ref 96–108)
Creatinine: 0.95 mg/dL (ref 0.51–0.95)
Glucose: 97 mg/dL (ref 60–99)
Lab: 28 mg/dL — ABNORMAL HIGH (ref 6–20)
Potassium: 4.7 mmol/L (ref 3.3–5.1)
Sodium: 140 mmol/L (ref 133–145)
Total Protein: 6.4 g/dL (ref 6.3–7.7)
eGFR BY CREAT: 68 *Unknown

## 2022-12-14 LAB — CARBOXYHEMOGLOBIN: CO: 2.5 *Unknown

## 2022-12-14 LAB — NEUTROPHIL #-INSTRUMENT: Neutrophil #-Instrument: 3 10*3/uL

## 2022-12-15 ENCOUNTER — Other Ambulatory Visit: Payer: Self-pay | Admitting: Internal Medicine

## 2022-12-18 LAB — NICOTINE & METABOLITE
Cotinine: <5 ng/mL
Nicotine: <5 ng/mL

## 2022-12-18 NOTE — Comprehensive Assessment (Signed)
Contact Information   Spokesperson Name Okey Regal   Relationship to Patient  Parent   Is spokesperson the patient's caregiver after discharge? Yes   Discharge transportation   Relationship to Patient  Parent         Demographics   [place]   Ethnicity/Race Caucasian   Primary Language English   Primary Care Taker of? No one   Risk Factors   Risk Factors Adjustment to Dx/Injury/Illness   Living Situation   Lives With Alone  (will stay at her mother's house post surgery)   Can they assist patient after discharge? Yes   Home Geography   Type of Home [facility]   # Of Steps In Home 0   # Steps to Enter Home    (ramp)   Bedroom First floor   Bathroom First floor - full   Utilitites Working Yes   Palliative Care Assessment   Person assessed Patient   Coping Status Appropriate to Stressor   Current Goal of Care Curative   Baseline ADL functioning   Transfers Independent   Ambulation Independent   Assistive Device none  (uses an electric scooter to take her dog for a walk)   Bathing/Grooming Independent   Meal Prep Independent   Able to feed self? Yes   Household maintenance/chores Independent   Able to drive? Yes   Home Care Services   Current Home Equipment Walker (rolling);Cane (straight)      Spoke with pt to review d/c plan after upcoming surgery, chart reviewed. SW reviewed home care and options, selected [facility], referral initiated today. Pt has no concern for d/c to home with support from mother.      Address post surgery:  [address]  [place] [address]     Fernand Parkins MSW  Pager: [phone]

## 2022-12-18 NOTE — Progress Notes (Signed)
 SW notified that [facility] is unable to accept the referral, referral initiated with [facility]. Await determination.    Fernand Parkins MSW  Pager: [phone]

## 2022-12-21 ENCOUNTER — Other Ambulatory Visit: Payer: Self-pay

## 2022-12-21 ENCOUNTER — Inpatient Hospital Stay: Payer: Medicare Other

## 2022-12-21 ENCOUNTER — Encounter
Admission: RE | Disposition: A | Discharge status: Home Health | Payer: Self-pay | Source: Ambulatory Visit | Attending: Orthopedic Surgery

## 2022-12-21 ENCOUNTER — Inpatient Hospital Stay
Admission: RE | Admit: 2022-12-21 | Discharge: 2022-12-22 | Disposition: A | Discharge status: Home Health | Payer: Medicare Other | Source: Ambulatory Visit | Attending: Orthopedic Surgery | Admitting: Orthopedic Surgery

## 2022-12-21 ENCOUNTER — Inpatient Hospital Stay: Payer: Medicare Other | Admitting: Internal Medicine

## 2022-12-21 DIAGNOSIS — Z7901 Long term (current) use of anticoagulants: Secondary | ICD-10-CM | POA: Insufficient documentation

## 2022-12-21 DIAGNOSIS — M1611 Unilateral primary osteoarthritis, right hip: Secondary | ICD-10-CM

## 2022-12-21 DIAGNOSIS — T797XXA Traumatic subcutaneous emphysema, initial encounter: Secondary | ICD-10-CM

## 2022-12-21 DIAGNOSIS — E785 Hyperlipidemia, unspecified: Secondary | ICD-10-CM | POA: Insufficient documentation

## 2022-12-21 DIAGNOSIS — E039 Hypothyroidism, unspecified: Secondary | ICD-10-CM | POA: Insufficient documentation

## 2022-12-21 DIAGNOSIS — Z96641 Presence of right artificial hip joint: Secondary | ICD-10-CM

## 2022-12-21 DIAGNOSIS — Z9889 Other specified postprocedural states: Secondary | ICD-10-CM

## 2022-12-21 DIAGNOSIS — I4891 Unspecified atrial fibrillation: Secondary | ICD-10-CM | POA: Insufficient documentation

## 2022-12-21 DIAGNOSIS — M25551 Pain in right hip: Secondary | ICD-10-CM

## 2022-12-21 DIAGNOSIS — E119 Type 2 diabetes mellitus without complications: Secondary | ICD-10-CM | POA: Insufficient documentation

## 2022-12-21 DIAGNOSIS — M25751 Osteophyte, right hip: Secondary | ICD-10-CM | POA: Insufficient documentation

## 2022-12-21 DIAGNOSIS — Z7985 Long-term (current) use of injectable non-insulin antidiabetic drugs: Secondary | ICD-10-CM | POA: Insufficient documentation

## 2022-12-21 DIAGNOSIS — I5020 Unspecified systolic (congestive) heart failure: Secondary | ICD-10-CM | POA: Insufficient documentation

## 2022-12-21 DIAGNOSIS — N898 Other specified noninflammatory disorders of vagina: Secondary | ICD-10-CM

## 2022-12-21 HISTORY — PX: PR ARTHRP ACETBLR/PROX FEM PROSTC AGRFT/ALGRFT: 27130

## 2022-12-21 LAB — TYPE AND SCREEN
ABO RH Blood Type: A POS
Antibody Screen: NEGATIVE

## 2022-12-21 LAB — BASIC METABOLIC PANEL
Anion Gap: 10 (ref 7–16)
CO2: 22 mmol/L (ref 20–28)
Calcium: 8.9 mg/dL (ref 8.6–10.2)
Chloride: 101 mmol/L (ref 96–108)
Creatinine: 0.85 mg/dL (ref 0.51–0.95)
Glucose: 194 mg/dL — ABNORMAL HIGH (ref 60–99)
Lab: 23 mg/dL — ABNORMAL HIGH (ref 6–20)
Potassium: 4.6 mmol/L (ref 3.3–5.1)
Sodium: 133 mmol/L (ref 133–145)
eGFR BY CREAT: 77 *

## 2022-12-21 LAB — HCT AND HGB
Hematocrit: 37 % (ref 34–49)
Hemoglobin: 11.4 g/dL (ref 11.2–16.0)

## 2022-12-21 LAB — POCT GLUCOSE
Glucose POCT: 118 mg/dL — ABNORMAL HIGH (ref 60–99)
Glucose POCT: 123 mg/dL — ABNORMAL HIGH (ref 60–99)
Glucose POCT: 145 mg/dL — ABNORMAL HIGH (ref 60–99)
Glucose POCT: 189 mg/dL — ABNORMAL HIGH (ref 60–99)
Glucose POCT: 195 mg/dL — ABNORMAL HIGH (ref 60–99)

## 2022-12-21 SURGERY — ARTHROPLASTY, HIP, TOTAL
Anesthesia: General | Site: Hip | Laterality: Right | Wound class: Clean

## 2022-12-21 MED ORDER — CEFAZOLIN 2000 MG IN STERILE WATER 20ML SYRINGE *I*
2000.0000 mg | PREFILLED_SYRINGE | Freq: Once | INTRAVENOUS | Status: AC
Start: 2022-12-21 — End: 2022-12-21
  Administered 2022-12-21: 2000 mg via INTRAVENOUS
  Filled 2022-12-21: qty 20

## 2022-12-21 MED ORDER — VANCOMYCIN HCL IN NACL 1500 MG/275 ML IV SOLN *I*
1500.0000 mg | Freq: Once | INTRAVENOUS | Status: AC
Start: 2022-12-21 — End: 2022-12-21
  Administered 2022-12-21: 1500 mg via INTRAVENOUS
  Filled 2022-12-21: qty 275

## 2022-12-21 MED ORDER — ACYCLOVIR 800 MG PO TABS *I*
400.0000 mg | ORAL_TABLET | Freq: Two times a day (BID) | ORAL | Status: DC
Start: 2022-12-22 — End: 2022-12-22
  Administered 2022-12-22: 400 mg via ORAL
  Filled 2022-12-21: qty 1

## 2022-12-21 MED ORDER — GLUCAGON HCL (RDNA) 1 MG IJ SOLR *WRAPPED*
1.0000 mg | INTRAMUSCULAR | Status: DC | PRN
Start: 2022-12-21 — End: 2022-12-22

## 2022-12-21 MED ORDER — ONE STEP DOSE TO ADMINISTER *I*
INTRAMUSCULAR | Status: DC | PRN
Start: 2022-12-21 — End: 2022-12-21
  Administered 2022-12-21: 61 mL via INTRA_ARTICULAR

## 2022-12-21 MED ORDER — JUICE (FOR HYPOGLYCEMIA) *I*
120.0000 mL | ORAL | Status: DC | PRN
Start: 2022-12-21 — End: 2022-12-22

## 2022-12-21 MED ORDER — DEXTROSE 5 % FLUSH FOR PUMPS *I*
0.0000 mL/h | INTRAVENOUS | Status: DC | PRN
Start: 2022-12-21 — End: 2023-02-19

## 2022-12-21 MED ORDER — CEFAZOLIN 2000 MG IN STERILE WATER 20ML SYRINGE *I*
2000.0000 mg | PREFILLED_SYRINGE | Freq: Three times a day (TID) | INTRAVENOUS | Status: AC
Start: 2022-12-21 — End: 2022-12-21
  Administered 2022-12-21 (×2): 2000 mg via INTRAVENOUS
  Filled 2022-12-21 (×2): qty 20

## 2022-12-21 MED ORDER — SODIUM CHLORIDE 0.9 % FLUSH FOR PUMPS *I*
0.0000 mL/h | INTRAVENOUS | Status: DC | PRN
Start: 2022-12-21 — End: 2022-12-22

## 2022-12-21 MED ORDER — PREGABALIN 100 MG PO CAPS *I*
100.0000 mg | ORAL_CAPSULE | Freq: Two times a day (BID) | ORAL | Status: DC
Start: 2022-12-21 — End: 2022-12-22
  Administered 2022-12-21 – 2022-12-22 (×2): 100 mg via ORAL
  Filled 2022-12-21 (×2): qty 1

## 2022-12-21 MED ORDER — POLYETHYLENE GLYCOL 3350 PO PACK 17 GM *I*
17.0000 g | PACK | Freq: Every day | ORAL | Status: DC
Start: 2022-12-21 — End: 2023-02-19
  Administered 2022-12-22: 17 g via ORAL
  Filled 2022-12-21: qty 17

## 2022-12-21 MED ORDER — ATORVASTATIN CALCIUM 40 MG PO TABS *I*
40.0000 mg | ORAL_TABLET | Freq: Every evening | ORAL | Status: DC
Start: 2022-12-21 — End: 2022-12-22
  Administered 2022-12-21: 40 mg via ORAL
  Filled 2022-12-21: qty 1

## 2022-12-21 MED ORDER — CAMPHOR-MENTHOL 0.5-0.5 % EX LOTN *I*
TOPICAL_LOTION | CUTANEOUS | Status: DC | PRN
Start: 2022-12-21 — End: 2022-12-22

## 2022-12-21 MED ORDER — CHLORHEXIDINE GLUCONATE 0.12 % MT SOLN *I*
15.0000 mL | Freq: Once | OROMUCOSAL | Status: AC
Start: 2022-12-21 — End: 2022-12-21
  Administered 2022-12-21: 15 mL via OROMUCOSAL

## 2022-12-21 MED ORDER — BUPIVACAINE-EPINEPHRINE 0.25 % IJ SOLUTION *WRAPPED*
INTRAMUSCULAR | Status: AC
Start: 2022-12-21 — End: 2022-12-21
  Filled 2022-12-21: qty 60

## 2022-12-21 MED ORDER — SENNOSIDES 8.6 MG PO TABS *I*
2.0000 | ORAL_TABLET | Freq: Every evening | ORAL | Status: DC
Start: 2022-12-21 — End: 2022-12-22
  Administered 2022-12-21: 2 via ORAL
  Filled 2022-12-21: qty 2

## 2022-12-21 MED ORDER — INSULIN LISPRO (HUMAN) 100 UNIT/ML IJ/SC SOLN *WRAPPED*
0.0000 [IU] | Freq: Three times a day (TID) | SUBCUTANEOUS | Status: DC
Start: 2022-12-21 — End: 2022-12-22
  Administered 2022-12-21: 2 [IU] via SUBCUTANEOUS
  Filled 2022-12-21: qty 3

## 2022-12-21 MED ORDER — LIDOCAINE HCL (PF) 1 % IJ SOLN *I*
0.1000 mL | INTRAMUSCULAR | Status: AC | PRN
Start: 2022-12-21 — End: 2022-12-21
  Administered 2022-12-21: 0.1 mL via SUBCUTANEOUS
  Filled 2022-12-21: qty 2

## 2022-12-21 MED ORDER — MIDAZOLAM HCL 1 MG/ML IJ SOLN *I* WRAPPED
INTRAMUSCULAR | Status: DC | PRN
Start: 2022-12-21 — End: 2022-12-21
  Administered 2022-12-21 (×2): 2 mg via INTRAVENOUS

## 2022-12-21 MED ORDER — LACTATED RINGERS IV SOLN *I*
20.0000 mL/h | INTRAVENOUS | Status: DC
Start: 2022-12-21 — End: 2022-12-21

## 2022-12-21 MED ORDER — CALCIUM CARBONATE ANTACID 500 MG PO CHEW *I*
1000.0000 mg | CHEWABLE_TABLET | Freq: Three times a day (TID) | ORAL | Status: DC | PRN
Start: 2022-12-21 — End: 2023-02-19

## 2022-12-21 MED ORDER — SODIUM CHLORIDE 0.9 % IV SOLN WRAPPED *I*
20.0000 mL/h | Status: DC
Start: 2022-12-21 — End: 2022-12-21
  Administered 2022-12-21: 20 mL/h via INTRAVENOUS

## 2022-12-21 MED ORDER — CELECOXIB 200 MG PO CAPS *I*
200.0000 mg | ORAL_CAPSULE | Freq: Once | ORAL | Status: AC
Start: 2022-12-21 — End: 2022-12-21
  Administered 2022-12-21: 200 mg via ORAL
  Filled 2022-12-21: qty 1

## 2022-12-21 MED ORDER — HALOPERIDOL LACTATE 5 MG/ML IJ SOLN *I*
0.5000 mg | Freq: Four times a day (QID) | INTRAMUSCULAR | Status: DC | PRN
Start: 2022-12-21 — End: 2023-02-19

## 2022-12-21 MED ORDER — BUPIVACAINE IN DEXTROSE 0.75-8.25 % IT SOLN *I*
INTRATHECAL | Status: DC | PRN
Start: 2022-12-21 — End: 2022-12-21
  Administered 2022-12-21: 1.6 mL via INTRATHECAL

## 2022-12-21 MED ORDER — EPHEDRINE 5MG/ML IN NS IV/IJ *WRAPPED*
INTRAMUSCULAR | Status: DC | PRN
Start: 2022-12-21 — End: 2022-12-21
  Administered 2022-12-21 (×2): 10 mg via INTRAVENOUS

## 2022-12-21 MED ORDER — PROPOFOL INFUSION 10 MG/ML *I*
INTRAVENOUS | Status: AC
Start: 2022-12-21 — End: 2022-12-21
  Filled 2022-12-21: qty 300

## 2022-12-21 MED ORDER — SENNOSIDES 8.6 MG PO TABS *I*
2.0000 | ORAL_TABLET | Freq: Every evening | ORAL | 0 refills | Status: DC
Start: 2022-12-21 — End: 2023-01-18
  Filled 2022-12-21: qty 100, 50d supply, fill #0

## 2022-12-21 MED ORDER — SODIUM CHLORIDE 0.9 % IV SOLN WRAPPED *I*
125.0000 mL/h | Status: AC
Start: 2022-12-21 — End: 2022-12-22

## 2022-12-21 MED ORDER — FAMOTIDINE 20 MG PO TABS *I*
20.0000 mg | ORAL_TABLET | Freq: Two times a day (BID) | ORAL | 0 refills | Status: DC
Start: 2022-12-21 — End: 2022-12-22
  Filled 2022-12-21: qty 60, 30d supply, fill #0

## 2022-12-21 MED ORDER — METOPROLOL SUCCINATE 100 MG PO TB24 *I*
100.0000 mg | ORAL_TABLET | Freq: Two times a day (BID) | ORAL | Status: DC
Start: 2022-12-21 — End: 2022-12-22
  Administered 2022-12-21 – 2022-12-22 (×2): 100 mg via ORAL
  Filled 2022-12-21 (×2): qty 1

## 2022-12-21 MED ORDER — ACETAMINOPHEN 500 MG PO TABS *I*
1000.0000 mg | ORAL_TABLET | Freq: Three times a day (TID) | ORAL | Status: DC
Start: 2022-12-21 — End: 2023-02-19
  Administered 2022-12-21 – 2022-12-22 (×3): 1000 mg via ORAL
  Filled 2022-12-21 (×3): qty 2

## 2022-12-21 MED ORDER — DOXYCYCLINE HYCLATE 100 MG PO TABS *I*
100.0000 mg | ORAL_TABLET | Freq: Two times a day (BID) | ORAL | 0 refills | Status: AC
Start: 2022-12-22 — End: 2023-01-05
  Filled 2022-12-21: qty 28, 14d supply, fill #0

## 2022-12-21 MED ORDER — PROPOFOL INFUSION 10 MG/ML *I*
INTRAVENOUS | Status: DC | PRN
Start: 2022-12-21 — End: 2022-12-21
  Administered 2022-12-21: 90 ug/kg/min via INTRAVENOUS
  Administered 2022-12-21: 60 ug/kg/min via INTRAVENOUS
  Administered 2022-12-21: 80 ug/kg/min via INTRAVENOUS
  Administered 2022-12-21: 100 ug/kg/min via INTRAVENOUS
  Administered 2022-12-21: 70 ug/kg/min via INTRAVENOUS

## 2022-12-21 MED ORDER — LACTATED RINGERS IV SOLN *I*
125.0000 mL/h | INTRAVENOUS | Status: DC
Start: 2022-12-21 — End: 2022-12-21
  Administered 2022-12-21: 125 mL/h via INTRAVENOUS

## 2022-12-21 MED ORDER — MIDAZOLAM HCL 1 MG/ML IJ SOLN *I* WRAPPED
INTRAMUSCULAR | Status: AC
Start: 2022-12-21 — End: 2022-12-21
  Filled 2022-12-21: qty 4

## 2022-12-21 MED ORDER — DEXTROSE 50 % IV SOLN *I*
25.0000 g | INTRAVENOUS | Status: DC | PRN
Start: 2022-12-21 — End: 2023-02-19

## 2022-12-21 MED ORDER — APIXABAN 2.5 MG PO TABS *I*
2.5000 mg | ORAL_TABLET | Freq: Two times a day (BID) | ORAL | 0 refills | Status: AC
Start: 2022-12-22 — End: 2022-12-27
  Filled 2022-12-21: qty 10, 5d supply, fill #0

## 2022-12-21 MED ORDER — MAGNESIUM HYDROXIDE 400 MG/5ML PO SUSP *I*
30.0000 mL | Freq: Every day | ORAL | Status: DC | PRN
Start: 2022-12-21 — End: 2022-12-22
  Filled 2022-12-21: qty 30

## 2022-12-21 MED ORDER — ACETAMINOPHEN 500 MG PO TABS *I*
1000.0000 mg | ORAL_TABLET | Freq: Three times a day (TID) | ORAL | 0 refills | Status: DC
Start: 2022-12-21 — End: 2023-05-20
  Filled 2022-12-21: qty 100, 16d supply, fill #0

## 2022-12-21 MED ORDER — ACETAMINOPHEN 500 MG PO TABS *I*
1000.0000 mg | ORAL_TABLET | Freq: Once | ORAL | Status: AC
Start: 2022-12-21 — End: 2022-12-21
  Administered 2022-12-21: 1000 mg via ORAL
  Filled 2022-12-21: qty 2

## 2022-12-21 MED ORDER — APIXABAN 5 MG PO TABS *I*
5.0000 mg | ORAL_TABLET | Freq: Two times a day (BID) | ORAL | 3 refills | Status: DC
Start: 2022-12-21 — End: 2022-12-21
  Filled 2022-12-21: qty 180, 90d supply, fill #0

## 2022-12-21 MED ORDER — GLUCOSE 40 % PO GEL *I*
15.0000 g | ORAL | Status: DC | PRN
Start: 2022-12-21 — End: 2022-12-22

## 2022-12-21 MED ORDER — OXYCODONE HCL 5 MG PO TABS *I*
5.0000 mg | ORAL_TABLET | ORAL | Status: DC | PRN
Start: 2022-12-21 — End: 2022-12-24
  Administered 2022-12-21: 5 mg via ORAL

## 2022-12-21 MED ORDER — DOCUSATE SODIUM 100 MG PO CAPS *I*
200.0000 mg | ORAL_CAPSULE | Freq: Every day | ORAL | Status: DC
Start: 2022-12-21 — End: 2023-02-19
  Administered 2022-12-22: 200 mg via ORAL
  Filled 2022-12-21: qty 2

## 2022-12-21 MED ORDER — BUPIVACAINE-EPINEPHRINE 0.5 % IJ SOLUTION *WRAPPED*
INTRAMUSCULAR | Status: AC
Start: 2022-12-21 — End: 2022-12-21
  Filled 2022-12-21: qty 60

## 2022-12-21 MED ORDER — NYSTATIN-TRIAMCINOLONE 100000-0.1 UNIT/GM-% EX CREA *I*
TOPICAL_CREAM | Freq: Two times a day (BID) | CUTANEOUS | Status: AC | PRN
Start: 2022-12-21 — End: ?

## 2022-12-21 MED ORDER — KETOROLAC TROMETHAMINE 30 MG/ML IJ SOLN *I*
INTRAMUSCULAR | Status: AC
Start: 2022-12-21 — End: 2022-12-21
  Filled 2022-12-21: qty 1

## 2022-12-21 MED ORDER — HALOPERIDOL LACTATE 5 MG/ML IJ SOLN *I*
0.5000 mg | Freq: Once | INTRAMUSCULAR | Status: DC | PRN
Start: 2022-12-21 — End: 2022-12-21

## 2022-12-21 MED ORDER — APIXABAN 2.5 MG PO TABS *I*
2.5000 mg | ORAL_TABLET | Freq: Two times a day (BID) | ORAL | Status: DC
Start: 2022-12-22 — End: 2022-12-22
  Administered 2022-12-22: 2.5 mg via ORAL
  Filled 2022-12-21: qty 1

## 2022-12-21 MED ORDER — LEVOTHYROXINE SODIUM 50 MCG PO TABS *I*
50.0000 ug | ORAL_TABLET | Freq: Every day | ORAL | Status: DC
Start: 2022-12-22 — End: 2022-12-22
  Administered 2022-12-22: 50 ug via ORAL
  Filled 2022-12-21: qty 1

## 2022-12-21 MED ORDER — APIXABAN 5 MG PO TABS *I*
5.0000 mg | ORAL_TABLET | Freq: Two times a day (BID) | ORAL | Status: DC
Start: 2022-12-27 — End: 2023-05-25

## 2022-12-21 MED ORDER — ONDANSETRON HCL 2 MG/ML IV SOLN *I*
4.0000 mg | Freq: Four times a day (QID) | INTRAMUSCULAR | Status: DC | PRN
Start: 2022-12-21 — End: 2022-12-22

## 2022-12-21 MED ORDER — OXYCODONE HCL 5 MG PO TABS *I*
10.0000 mg | ORAL_TABLET | ORAL | Status: DC | PRN
Start: 2022-12-21 — End: 2022-12-22
  Administered 2022-12-21 – 2022-12-22 (×3): 10 mg via ORAL
  Filled 2022-12-21 (×4): qty 2

## 2022-12-21 MED ORDER — VANCOMYCIN HCL IN NACL 1500 MG/275 ML IV SOLN *I*
1500.0000 mg | Freq: Once | INTRAVENOUS | Status: AC
Start: 2022-12-21 — End: 2022-12-21
  Administered 2022-12-21: 1500 mg via INTRAVENOUS
  Filled 2022-12-21: qty 1500

## 2022-12-21 MED ORDER — MELOXICAM 7.5 MG PO TABS *I*
7.5000 mg | ORAL_TABLET | Freq: Every day | ORAL | Status: DC
Start: 2022-12-22 — End: 2022-12-21

## 2022-12-21 MED ORDER — ESCITALOPRAM OXALATE 10 MG PO TABS *I*
10.0000 mg | ORAL_TABLET | Freq: Every evening | ORAL | Status: DC
Start: 2022-12-21 — End: 2022-12-22
  Administered 2022-12-22: 10 mg via ORAL
  Filled 2022-12-21 (×2): qty 1

## 2022-12-21 MED ORDER — POVIDONE-IODINE 5 % EX SOLN *I*
Freq: Once | CUTANEOUS | Status: AC
Start: 2022-12-21 — End: 2022-12-21
  Administered 2022-12-21: 2 via TOPICAL

## 2022-12-21 MED ORDER — DOXYCYCLINE HYCLATE 100 MG PO TABS *I*
100.0000 mg | ORAL_TABLET | Freq: Two times a day (BID) | ORAL | Status: DC
Start: 2022-12-22 — End: 2022-12-22
  Administered 2022-12-22: 100 mg via ORAL
  Filled 2022-12-21: qty 1

## 2022-12-21 MED ORDER — PHENYLEPHRINE 100 MCG/ML IN NS 10 ML *WRAPPED*
INTRAMUSCULAR | Status: DC | PRN
Start: 2022-12-21 — End: 2022-12-21
  Administered 2022-12-21 (×7): 100 ug via INTRAVENOUS

## 2022-12-21 MED ORDER — FAMOTIDINE 20 MG PO TABS *I*
20.0000 mg | ORAL_TABLET | Freq: Two times a day (BID) | ORAL | Status: DC
Start: 2022-12-21 — End: 2022-12-22
  Administered 2022-12-21 – 2022-12-22 (×2): 20 mg via ORAL
  Filled 2022-12-21 (×2): qty 1

## 2022-12-21 MED ORDER — MORPHINE SULFATE 2 MG/ML IV SOLN *WRAPPED*
2.0000 mg | Status: DC | PRN
Start: 2022-12-21 — End: 2022-12-21

## 2022-12-21 MED ORDER — DEXAMETHASONE SODIUM PHOSPHATE 4 MG/ML INJ SOLN *WRAPPED*
INTRAMUSCULAR | Status: DC | PRN
Start: 2022-12-21 — End: 2022-12-21
  Administered 2022-12-21: 8 mg via INTRAVENOUS

## 2022-12-21 MED ORDER — ONDANSETRON HCL 2 MG/ML IV SOLN *I*
4.0000 mg | Freq: Once | INTRAMUSCULAR | Status: DC | PRN
Start: 2022-12-21 — End: 2022-12-21

## 2022-12-21 MED ORDER — TRANEXAMIC ACID 1000 MG / 0.7% NACL 100 ML IVPB *I*
1000.0000 mg | Freq: Once | INTRAVENOUS | Status: AC
Start: 2022-12-21 — End: 2022-12-21
  Administered 2022-12-21: 1000 mg via INTRAVENOUS
  Filled 2022-12-21: qty 100

## 2022-12-21 MED ORDER — BUPIVACAINE HCL 0.25 % IJ SOLUTION *WRAPPED*
Status: AC
Start: 2022-12-21 — End: 2022-12-21
  Filled 2022-12-21: qty 60

## 2022-12-21 MED ORDER — POLYETHYLENE GLYCOL 3350 PO POWD *I*
17.0000 g | Freq: Every day | ORAL | 0 refills | Status: DC
Start: 2022-12-21 — End: 2023-01-18
  Filled 2022-12-21: qty 238, 14d supply, fill #0

## 2022-12-21 MED ORDER — LABETALOL HCL 20 MG/4 ML IV SOLN WRAPPED *I*
5.0000 mg | INTRAVENOUS | Status: DC | PRN
Start: 2022-12-21 — End: 2022-12-21

## 2022-12-21 SURGICAL SUPPLY — 42 items
BIT DRILL 2.4MM QC-100MM (Supply) ×1 IMPLANT
BLADE DUAL CUT SAG 1.35MM THICK (Supply) ×1 IMPLANT
BOWL STERILE MED 16OZ (Supply) ×1 IMPLANT
CLOSURE STERI-STRIP REINF .5 X 4IN LF (Dressing) ×1 IMPLANT
DRAPE SUR W112XL137IN 5 LAYR HIP SMS POLYPR ABSRB REINF FEN DISP FOR ORTH PROC (Drape) ×1 IMPLANT
DRAPE SUR W70XL100IN STD SMS POLYPR FULL SHT W/O FLD PCH DISP (Drape) ×2 IMPLANT
DRESSING ANTIMICROBIAL 3.5 X 10 INCH AQUACEL AG ADVANTAGE (Dressing) ×1 IMPLANT
ELECTRODE ADULT POLYHESIVE PAT RETURN (Supply) ×1 IMPLANT
FILTER NEPTUNE 4PORT MANIFOLD (Supply) ×1 IMPLANT
GLOVE SURG BIOGEL PI ULTRATOUCH SZ 7.5 (Glove) ×8 IMPLANT
GLOVE SURG GAMMEX NON-LATEX ORTHO PI SZ 8 (Glove) ×1 IMPLANT
GOWN ORBIS LVL 3 XLNG/XXLARGE STRL (Gown) ×1 IMPLANT
HANDPIECE INTERPULSE W/COAXIAL BONE (Supply) ×1 IMPLANT
HEAD DELTA CERAMIC 36MM SZ 5 (Implant) ×1 IMPLANT
HOOD SURGICAL W/PEEL AWAY FACE SHIELD T7PLUS (Supply) ×1 IMPLANT
HOOD W/ANTI-REFLECTIVE LENS T7PLUS (Supply) ×2 IMPLANT
LINER ACETABULAR 52MM 36MM AOX ELEVATED RIM EMPHASYS (Implant) ×1 IMPLANT
LINER ACETABULAR OD52MM 36MM AOX ELEVATED RIM EMPHASYS (Implant) ×1 IMPLANT
PACK CUSTOM TOTAL HIP TRAY (Pack) ×1 IMPLANT
PACK OR ORTHO BOLSTER (Other) ×1 IMPLANT
PROTECTOR ULNA NERVE ~~LOC~~ (Supply) ×1 IMPLANT
RETRIEVER HEWSON SUTR (Supply) ×1 IMPLANT
SHELL ACETABULAR 52MM 3 HOLE CEMENTLESS EMPHASYS (Implant) ×1 IMPLANT
SLEEVE COMP KNEE MED BLENDED (Supply) ×1 IMPLANT
SOL BOTTLE NO RINSE ANTIMICROBIAL W/DECANTERS  XPERIENCE (Solution) ×1 IMPLANT
SOL SODIUM CHLORIDE IRRIG 1000ML BTL (Solution) IMPLANT
SOL WATER IRRIG STERILE 1000ML BTL (Solution) ×2 IMPLANT
SPIKE MINI DISPENSING PIN (BBRAUN DP1000) (Supply) IMPLANT
STEM FEMORAL ACTIS STD COLLARED  SZ 6 (Implant) ×1 IMPLANT
STRAP REPLCMT LITHOTOMY 10 X 1.5IN (Supply) ×1 IMPLANT
SUTR STARTAFIX 1 PDS PLUS CTZ 60CM (Suture) ×1 IMPLANT
SUTR STRATAFIX SPIRAL MONO 3-0 PS-1 45CM (Suture) IMPLANT
SUTR VICRYL ANTIB 1 CTX 18 POP VIOLET (Suture) ×1 IMPLANT
SUTURE ETHBND EXCEL SZ 2 L30IN NONABSORBABLE GRN L40MM V-37 1/2 CIR TAPERCUT NDL POLY COAT (Suture) ×1 IMPLANT
SUTURE STRATAFIX MONOCRYL PLUS 3-0 L30X30CM ABSORBABLE UNDYED PS-1 TRICLOSAN POLIGLECAPRONE 25 SPIRAL (Suture) ×1 IMPLANT
SUTURE VCRL + SZ 0 L27IN ABSRB UD CP-1 1/2 CIR REV CUT NDL POLYGLACTIN 910 BRAID ANTIBACT (Suture) IMPLANT
SYRINGE IRRIG 60ML BULB SOFT PLIABLE ONE HANDED USE W/TYVEK LID (Syringe) ×1 IMPLANT
SYRINGE LUERLOCK 50 ML INDIVIDUAL WRAP (Syringe) ×1 IMPLANT
SYRINGE TB SAFETYGLIDE 1CC 27G X 1/2IN (Syringe) ×1 IMPLANT
SYSTEM SURGIPHOR ANTIMICROBIAL IRRIGATION (Supply) ×1 IMPLANT
TAPE DURAPORE 3IN SILK NL (Dressing) ×1 IMPLANT
WAX BNE 2.5GM NAT DISP FOR WND CLSR (Implant) IMPLANT

## 2022-12-21 NOTE — Progress Notes (Signed)
Report Given To  SE6 RN Ryan      Descriptive Sentence / Reason for Admission     RIGHT POSTERIOR ARTHROPLASTY, HIP, TOTAL    Dr. Arlyce Dice      Active Issues / Relevant Events   Patient received Bupivacaine spinal @ 0735  Aquacel dressing, +2 pulse   Has sensation at level: L4 bilaterally, able to move toes and knees   Patient (ON) BG protocol: 123 in pacu; rechecked BG per protocol at 1143; BG 145 mg/dl, pt declined insulin  Last pain score: 0/10  Oxygen is: 98% on RA   Medications administered: none given   Comorbidities include: sleep apnea, CHF, arthritis, a fib, DM, hypothyroidism   Xray complete in PACU: yes   Bladder scanned for: 224 @ 1050    IV HL           To Do List    Floor orderds  PT.OT   Posterior hip precautions       Anticipatory Guidance / Discharge Planning    Pending

## 2022-12-21 NOTE — Anesthesia Procedure Notes (Signed)
---------------------------------------------------------------------------------------------------------------------------------------    NEURAXIAL BLOCK PLACEMENT  Single Shot Spinal    Date of Procedure: 12/21/2022 7:52 AM    Patient Location:  OR    Reason for Block: at surgeon's request      Block Completion: successfully completed  CONSENT AND TIMEOUT     Consent:  Obtained per policy    Timeout: patient identified (name/DOB) , proper patient position verified, operative procedure/site/side verified by patient or family , block site initialed by attending, needed equipment, monitors, medications and access verified as present and functioning , operative procedure/side/site verified against surgical consent, allergies reviewed with patient/record , operative procedure/side/site verified against surgical schedule, all members of the block team participated in the timeout and anticoagulation/antiplatelet status reviewed  METHOD:    Patient Position: sitting    Monitoring: continuous pulse oximetry      Labeling: syringes appropriately labeled    Sedation Used: yes            For medications used, please see MAR        Level of Sedation: mild      Sterile Technique:  Hand sanitizing, hat, mask and sterile gloves    Prep: aseptic technique per protocol, chlorhexidine+isopropanol and site draped      Successful Approach: midline    Successful Location: L3-4    Attempts (Skin Punctures):  1  SPINAL NEEDLE:      Introducer: 20 G    Type: Pencan    Gauge: 25 G    Length: 3.5 in    CSF Appearance: clear  OBSERVATIONS:    Block Completion:  Successfully completed      Paresthesia: none      Neuraxial Blood: blood not aspirated      Motor Block: dense      Patient Reaction to Block: tolerated procedure well and vitals remained stable    ----------------------------------------------------------------------------------------------------------------------------------------

## 2022-12-21 NOTE — Preop H&P (Signed)
UPDATES TO PATIENT'S CONDITION on the DAY OF SURGERY/PROCEDURE    I. 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   Peripheral vascular skin changes/ulcers: No      II. Procedure Readiness   I have reviewed the patient's H&P and updated condition. By completing and signing this form, I attest that this patient is ready for surgery/procedure.    III. Attestation   I have reviewed the updated information regarding the patient's condition and it is appropriate to proceed with the planned surgery/procedure.      CHAYNA PICK was seen and evaluated at bedside in pre-op holding, H&P reviewed, no changes noted.  We discussed risks related to total hip arthroplasty (THA) including infection requiring further surgery and prolonged morbidity, bleeding requiring transfusion, nerve injury resulting in permanent quadriceps and or leg weakness/foot drop & gait impairment, nerve injury resulting in numbness in the thigh or foot, blood vessel injury requiring further surgery, iatrogenic femur/acetabular fracture, hip instability, limb length inequality, delayed healing of incisions requiring repeat procedures or more frequent monitoring in the office, complications that may result in loss of limb  structure or function, risk of anesthesia, blood clots in the legs and/or lungs, post op ileus, risk of exacerbation of underlying medical issues resulting in heart attack, stroke, respiratory failure, kidney failure, and even death. The patient understood these risks and elected to proceed. Informed consent was obtained, the surgical site initialed, plan to proceed with right total hip arthroplasty.       Fransisca Connors, MD as of 7:18 AM 12/21/2022

## 2022-12-21 NOTE — Progress Notes (Addendum)
Physical Therapy    Initial Eval Completed.  Patient is a 62 y.o.female who presents POD 0 s/p R THA, posterior hip precautions, WBAT RLE.    Is another physical therapy session necessary prior to hospital discharge:  Yes    Discharge recommendation:  Anticipate return to prior living arrangement, Intermittent supervision/assist, Home PT, Recommend patient be seen again by physical therapy PRIOR to discharge  Equipment recommendations upon discharge: None    Mobility recommendations for nursing while in hospital: 1A with RW    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    Conditioned w/ Busulfan 130 mg/m2/d and Fludarabine 40 mg/m2/d x4 days followed by 10/10 HLA MUD (F) Allogeneic PBSCT (pt/donor: ABO: A+/A+, CMV N/N)    Pulmonary edema     s/p L THA 10/20/21 10/20/2021    Shortness of breath     Tobacco use        Past Surgical History:   Procedure Laterality Date    BONE MARROW TRANSPLANT  2017    Mediport insertion      PR ARTHRP ACETBLR/PROX FEM PROSTC AGRFT/ALGRFT Left 10/20/2021    Procedure: LEFT POSTERIOR ARTHROPLASTY, HIP, TOTAL;  Surgeon: Fransisca Connors, MD;  [facility];  Service: Orthopedics    PR AXILLARY LYMPHADENECTOMY COMPLETE Left 01/24/2021    Procedure: sentinel lymph node biopsy;  Surgeon: Sheliah Mends, MD;  Location: [facility];  Service: Oncology General    PR CARDIOVERSION ELECTIVE ARRHYTHMIA EXTERNAL N/A 09/24/2022     Procedure: Cardioversion;  Surgeon: Katherine Roan, NP;  Location: [facility] EP LABS;  Service: Cardiovascular    PR MASTECTOMY PARTIAL Left 01/24/2021    Procedure: LEFT NEEDLE LOC PARTIAL MASTECTOMY   ;  Surgeon: Sheliah Mends, MD;  Location: [facility];  Service: Oncology General    ROTATOR CUFF REPAIR Right        *Bold Indicates co-morbidities affecting treatment and recovery    Comorbidities affecting treatment/recovery in addition to those listed above:  none noted    Personal factors affecting treatment/recovery:  Lives alone  Needs assistance for mobility    Clinical presentation:  stable       Patient complexity:  low level as indicated by above personal factors, environmental factors and comorbidities in addition to their impairments found on physical exam.     PT Adult Assessment - 12/21/22 1307          Prior Living     Prior Living Situation Reported by patient     Lives With Alone     Type of Home Apartment     # Steps to Enter Home 0     # Rails to Enter Home 0     # Of Steps In Home 0     #  Rails in Home 0     Location of Bedrooms 1st floor     Location of Bathrooms 1st floor     Bathroom Accessibility Walk-in shower     Current Home Equipment Grab bars in shower/tub;Cane (straight);Walker (rolling);Art gallery manager     Additional Comments Pt reports she will be staying at her mom's home for two days and then will be going back to her apartment and her mom will be staying with her. Her mom's house is a ranch with 0 stairs        Prior Function Level    Prior Function Level Reported by patient     Transfers Independent     Transfer Devices None     Walking Independent;Household distances only   per chart, pt used electric scooter to walk her dog    Walking assistive devices used None     Stair negotiation Independent     History of Falls No     Receives Help From Independent     Additional Comments pt's mom able to assist as needed        PT Tracking    PT TRACKING PT Assigned     Type of Session  evaluation     SW Request? No        Treatment Day    Treatment Day (HH / [facility]) 1     Additional Comments pt's mom present during session        Precautions/Observations    Precautions used Yes     Total Hip Replacement Patient able to verbalize precautions of ADduction;Patient able to verbalize precautions of Internal rotation;Patient able to verbalize precautions of Flexion;Surgical Approach: Posterior lateral     Weight Bearing Status Right Lower Extremity - weight bearing as tolerated     LDA Observation IV lines   disconnected    Was patient wearing a mask? No     PPE worn by Clinical research associate [facility]     Fall Precautions General falls precautions;Patient educated to use call light for nursing assist prior to getting up;White board updated with appropriate mobility status        Current Pain Assessment    Pain Assessment / Reassessment Assessment     Pain Scale 0-10 (Numeric Scale for Pain Intensity)      0-10 Scale 7   Pt reports 8/10 after ambulation    Pain Location/Orientation Hip Right     Pain Descriptors Cramping     (T) Pain Intervention(s) for current pain Ambulation/increased activity;Repositioned;Cold applied        Vision     Current Vision Adequate for PT session        Cognition    Cognition Tested     Arousal/Alertness Appropriate responses to stimuli     Orientation Alert and oriented x3     Ability to Follow Instructions Follows all commands and directions without difficulty        UE Assessment    Assessment Focus Strength        LUE Strength    Overall Strength WFL assessed within functional activities        RUE Strength    Overall Strength WFL assessed within functional activities        LE Assessment    Assessment Focus Range of Motion;Strength        LLE (degrees)    Overall ROM AROM WFL        RLE (degrees)    Overall ROM Unable to assess  due to precautions        LLE Strength    Overall Strength WFL assessed within functional activities     Ankle Dorsiflexion 5/5     Ankle Plantar Flexion 5/5         RLE Strength    Overall Strength WFL assessed within functional activities     Ankle Dorsiflexion 5/5     Ankle Plantar Flexion 5/5        Sensation    Sensation No apparent deficit     Additional Comments intact light touch in bilateral LEs        Bed Mobility    Bed mobility Tested     Supine to Sit Verbal cues;1 person assist;Side rails up (#);Head of bed elevated;Stand by assistance     Sit to Supine Not tested   pt ended session in bedside chair    Additional comments pt required increased time and effort to perform. able to transfer while maintaining hip precautions. denies dizziness and lightheadedness with change in position        Transfers    Transfers Tested     Sit to Stand Verbal cues;Contact guard;1 person assist     Stand to sit Verbal cues;Contact guard;1 person assist     Transfer Assistive Device rolling walker     Additional comments x1 from EOB. provided verbal cues for hand placement. pt passed Egress test with no loss of balance throughout        Mobility    Mobility Tested     Weight Bearing Status Right Lower Extremity - weight bearing as tolerated     Gait Pattern 3 point;Decreased cadence;Decreased L step length;Decreased R step length;Decreased R step height;Decreased L step height     Ambulation Assist Contact guard;1 person assist   2nd assist for chair follow    Ambulation Distance (Feet) 80     Ambulation Assistive Device rolling walker     Additional comments instructed pt in ambulation in room and hallway with step to pattern and RW. pt reports mild lightheadedness that does not get worse. no loss of balance throughout        Family/Caregiver Training`    Patient/Family/Caregiver training Yes     Family/caregiver training Role of physical therapy in hospital and plan for evaluation and follow up;PT plan of care after evaluation;Call don't fall, purpose of bed/chair alarm, and recommendation for nursing assistance during all oob mobility     Patient training Role of physical  therapy in hospital and plan for evaluation and follow up;PT plan of care after evaluation;Breathing technique during activity;Use of assistive device;Equipment recommendations for discharge;Therapeutic exercises, including recommendation of frequency of therapeutic exercises to be completed by patient throughout day;Benefits of oob activity and mobility during hospitalization, including frequency of ambulation and change of position;Call don't fall, purpose of bed/chair alarm, and recommendation for nursing assistance during all oob mobility        Therapeutic Exercises    Exercises Performed Total hip replacement     Ankle Pumps Bilaterally     Ankle Pumps-Assist Active     Ankle Pumps-Reps 10     Quad Sets Bilaterally     Quad Sets-Reps 10        Balance    Balance Tested     Sitting - Static Standby assist;Unsupported     Sitting - Dynamic Standby assist;Unsupported     Standing - Static Contact guard;Supported     Standing - Teaching laboratory technician guard;Supported  Functional Outcome Measures    Functional Outcome Measures Yes        PT AM-PAC Mobility    Turning over in bed? A Little: Minimum/Contact Guard Assist/Supervision     Moving from lying on back to sitting on the side of the bed? A Little: Minimum/Contact Guard Assist/Supervision     Moving to and from a bed to a chair? A Little: Minimum/Contact Guard Assist/Supervision     Sitting down on and standing up from a chair with arms? A Little: Minimum/Contact Guard Assist/Supervision     Need to walk in hospital room? A Little: Minimum/Contact Guard Assist/Supervision     Climbing 3 - 5 steps with a railing? A Little: Minimum/Contact Guard Assist/Supervision     Total Raw Score 18     Standardized Score - Calculated 43.63     % Functional Impairment - Calculated 47%%        Assessment    Brief Assessment Appropriate for skilled therapy     Problem List Impaired LE ROM;Impaired LE strength;Impaired endurance;Impaired balance;Impaired transfers;Impaired  ambulation;Impaired bed mobility;Impaired stair navigation     Patient / Family Goal return home     Overall Assessment Pt presents POD 0 s/p R THA, posterior hip precautions, WBAT RLE. Pt tolerated mobility well this session but is limited by pain and lightheaded symptoms. Anticipate 1-2 additional PT sessions to progress towards modified independence        Plan/Recommendation    PT Treatment Interventions Restorative PT     PT Frequency 5-7x/wk;BID Visit     PT Mobility Recommendations 1A with RW     PT Referral Recommendations OT;SW;Home care     PT Discharge Recommendations Anticipate return to prior living arrangement;Intermittent supervision/assist;Home PT;Recommend patient be seen again by physical therapy PRIOR to discharge     PT Discharge Equipment Recommended None     PT Assessment/Recommendations Reviewed With: Nursing;Patient;Family     Next PT Visit progress per protocol     PT needs to see patient prior to DC  Yes        Time Calculation    PT Timed Codes 0     PT Untimed Codes 23     PT Unbilled Time 0     PT Total Treatment 23        Plan and Onset date    Plan of Care Date 12/21/22     Onset Date 12/21/22     Treatment Start Date 12/21/22                     AMPAC Score Interpretation:  Adapted from Daniel L. Young et al [employer] learning prediction of hospital patient need for post-acute care using admission mobility measure is robust across patient diagnoses. Health Policy and Technology, volume 12, issue 2, June 2023, 100754     Patients 66 years or older with an AM-PAC standardized score of <31 have an increased likelihood of requiring post-acute care (76%)  Patients that are less than 73 years old with an AM-PAC standardized score of <31 (41-59%) or, patients that are>68 years old with an AM-PAC standardized score >/=31 (37-50%) have a moderate likelihood of requiring post-acute care   Patients with an AM-PAC standardized score of >43 have an increased likelihood for discharge to home  regardless of age. (5-12%)    Zara Chess, PT  Please secure chat to communicate if needed.

## 2022-12-21 NOTE — Anesthesia Procedure Notes (Signed)
---------------------------------------------------------------------------------------------------------------------------------------    AIRWAY   GENERAL INFORMATION AND STAFF    Patient location during procedure: OR       Date of Procedure: 12/21/2022 7:51 AM  CONDITION PRIOR TO MANIPULATION     Current Airway/Neck Condition:  Normal        For more airway physical exam details, see Anesthesia PreOp Evaluation  AIRWAY METHOD     Patient Position:  Lateral    Number of Attempts at Approach:  1  FINAL AIRWAY DETAILS    Final Airway Type:  Mask    Type of Mask: simple face  ----------------------------------------------------------------------------------------------------------------------------------------

## 2022-12-21 NOTE — Plan of Care (Signed)
Problem: Mobility for Total Hip Arthroplasty  Goal: Patient's mobility and activity restrictions are in accordan  Outcome: Maintaining     Problem: Potential for impaired Circulation, Motor, or Sensation  Goal: Patient's CMS status is maintained or improved from baseline  Outcome: Maintaining     Problem: Bladder Elimination Post-Surgical (joint)  Goal: Patient is able to empty bladder or return to baseline (join  Outcome: Maintaining     Problem: Risk for Falls  Goal: No falls during hospitalization  Description: Patient will not fall during hospitalization.  Outcome: Maintaining     Problem: Bowel Elimination post surgical  Goal: Elimination pattern is normal or improving  Outcome: Progressing towards goal     Problem: Infection Prevention  Goal: Patient is knowledgeable of measures to prevent infection  Outcome: Maintaining     Problem: Pain/comfort (joint)  Goal: Patient's pain or discomfort is manageable (joint)  Outcome: Maintaining     Problem: Glycemic control  Goal: Blood glucoses 70-180 mg/dl  Outcome: Maintaining

## 2022-12-21 NOTE — Progress Notes (Signed)
Orthopaedic Surgery Progress Note for 12/21/2022    Patient:Carly Higgins  MRN: [id number]  DOA: 12/21/2022    Subjective: 16F s/p R pTHA on 12/21/22. NAE, AVSS. Pain well controlled post-op.  Tolerating oral intake. +Void/-flatus/-BM. OOB with PT . Denies fever/chills, SOB, chest pain, nausea/vomiting, or numbness/weakness.     Objective:  Temp:  [36 C (96.8 F)-36.7 C (98.1 F)] 36.2 C (97.2 F)  Heart Rate:  [59-101] 63  Resp:  [10-22] 16  BP: (80-117)/(53-77) 103/62    Recent Labs   Lab 12/21/22  1543   Hemoglobin 11.4   Hematocrit 37     Recent Labs   Lab 12/21/22  1543   Sodium 133   Potassium 4.6   Chloride 101   CO2 22     No components found with this basename: "BUN", "LABGLOM", "CALCIUM"    No results for input(s): "APTT", "INR", "PTT" in the last 168 hours.  No results for input(s): "ESR", "CRP" in the last 168 hours.    Exam:  NAD  No respiratory distress    RLE: Aquacel with a <1cm area of ss strikethrough along the distal incision, remainder CDI.  5/5 motors with regards to ADF/APF/EHL/Toe f/e. SILT sp/dp/saph/sur/tib nerve distributions. Brisk CR, 1+ DP/2+ PT    Assessment and Plan:  62 y.o. female with PMH Afib (eliquis), obesity,  CHF, DM, hypothyroidism, myeloproliferative neoplasm s/p MUD allo and breast cancer, chemo-induced neuropathy who is now s/p R pTHA on 12/21/22.    Active hospital problems:   Acute postop recovery.  Procedure: 16F s/p R pTHA on 12/21/22.  Planned return to operating room: None anticipated  Antibiotic regimen: Periop ancef, doxy x2 weeks  Pain Management: MM  DVT prophylaxis and duration: Eliquis 2.5 mg BID starting POD1.   Weight bearing status and activity restrictions: WBAT RLE, posterior hip precautions  Wound/Pin Care:  Dressing to remain in place  Postoperative x-rays and radiographic studies: AP pelvis obtained in PACU reveals well-seated implants with no evidence of fracture or dislocation.  Diet: Regular, AAT.   Foley: NA  Additional Specifics (as needed):  NA  Anticipated outpatient clinic follow-up date: as scheduled  Dispo: Pending postop course, progress w/ PT.    Birdena Crandall, MD on 12/21/2022 at 6:09 PM

## 2022-12-21 NOTE — INTERIM OP NOTE (Addendum)
Procedure: 39F s/p R pTHA on 12/21/22.  Planned return to operating room: None anticipated  Antibiotic regimen: Periop ancef, Doxy x2 weeks  Pain Management: MM  DVT prophylaxis and duration: Eliquis 2.5 mg BID starting POD1.  Weight bearing status and activity restrictions: WBAT RLE  Wound/Pin Care:  Dressing to remain in place  Postoperative x-rays and radiographic studies: AP pelvis in PACU  Diet: Regular, AAT.   Foley: NA  Additional Specifics (as needed): NA  Anticipated outpatient clinic follow-up date: as scheduled  Dispo: Pending postop course, progress w/ PT.    Interim Op Note (Surgical Log ID: [id number])       Date of Surgery: 12/21/2022       Surgeons: Surgeons and Role:     * Fransisca Connors, MD - Primary   Assistants:         Pre-op Diagnosis: Pre-Op Diagnosis Codes:      * Primary osteoarthritis of right hip [M16.11]       Post-op Diagnosis: Post-Op Diagnosis Codes:     * Primary osteoarthritis of right hip [M16.11]       Procedure(s) Performed: Procedure(s) (LRB):  RIGHT POSTERIOR ARTHROPLASTY, HIP, TOTAL (Right)       Anesthesia Type: Anesthesia type not filed in the log.        Fluid Totals: No intake/output data recorded.       Estimated Blood Loss: * No values recorded between 12/21/2022 12:00 AM and 12/21/2022  5:32 AM *       Specimens to Pathology:  * No specimens in log *       Temporary Implants:        Packing:                 Patient Condition: good       Findings (Including unexpected complications): None unexpected     Signed:  Birdena Crandall, MD  on 12/21/2022 at 5:32 AM

## 2022-12-21 NOTE — Anesthesia Case Conclusion (Signed)
CASE CONCLUSION  [facility]  Assessment:  Routine  Transport  Directly to: PACU  Position:  Recumbent  Patient Condition on Handoff  Level of Consciousness:  Alert/talking/calm  Patient Condition:  Stable  Handoff Report to:  RN

## 2022-12-21 NOTE — Plan of Care (Signed)
HOME CARE AGENCY CHOICE    Medicare regulations require that hospitals advise patients that they may choose to receive services from any home health agency.  Patient/family choice of agency is noted with an X by that agency's name.      Agencies: []   [facility] [place], [place], [place], [place], [place], [place] and [place] counties)       [x]   [facility] [place]       []   [facility]      []   [facility] [place]        []   Other ____________________      Home Care Agency Representative alerted:    Name: Isabella Stalling, RN  Date: 12/18/2022  Time:       *[facility] along with [facility], [facility] are all part of the [place] [facility] [place]'s health care system.

## 2022-12-21 NOTE — Progress Notes (Signed)
Home Health Assessment    Completed by: Isabella Stalling, RN  Phone: [phone]    Referred by: Fernand Parkins      Source of Information: medical record      Home Health indicators present: May need PT, OT at home      Plan: Home care referral started; agency chosen [facility]      Comments: [facility] accepts for home PT to start 12/16

## 2022-12-21 NOTE — Op Note (Signed)
Operative Note      DATE OF SURGERY: 12/21/2022     SURGEON: Fransisca Connors, MD    ASSISTANTS: Dionne Bucy, MD RES; Myrene Buddy, PA    PRE-OPERATIVE DIAGNOSIS:  End-stage degenerative joint disease, right hip    POST-OPERATIVE DIAGNOSIS:  End-stage degenerative joint disease, right hip    PROCEDURE PERFORMED:  Primary cementless right total hip arthroplasty    ANESTHESIA:  General anesthesia, intrathecal analgesia    INTRAVENOUS FLUIDS:  Per anesthesia record    ESTIMATED BLOOD LOSS:  300 mL    DRAINS:  None    SPECIMEN:  None     COMPLICATIONS:  None    IMPLANTS USED:   1.  52 mm Depuy Emphasys with Gription highly porous cluster hole acetabular component  2. Depuy AOX vitamin-E infused highly cross linked polyethylene liner, inner diameter 36 mm, +0 neutral offset  3. Depuy Actis highly porous proximally coated with full Duofix HA coating femoral component, size 6, standard offset  4. Depuy Biolox Delta ceramic head size 36 mm with +5 mm neck for 12/14 taper    INTRAOPERATIVE FINDINGS: The intra-operative finding confirmed the clinical and radiographic finding of end-stage osteoarthritis characterized by complete cartilage loss on the femoral head and acetabulum with presence of osteophytes.     HISTORY AND INDICATIONS:   Carly Higgins is a 62 year old with a long-standing history of severe right hip degenerative joint disease, refractory to conservative management. The patient has progressive and debilitating hip pain secondary to end-stage osteoarthritis, supported by clinical and radiographic evidence. After understanding risks, benefits, and alternatives to surgical management, the patient elected to proceed with right total hip arthroplasty.  Informed consent was signed and completed by the patient. The patient was seen and thoroughly evaluated by their primary care physician and the presurgical testing team coordinated by the anesthesia department, and clearance was obtained to proceed with the elective  procedure.    SURGICAL PROCEDURE:  The patient was identified in the preoperative holding area.  Once again, risks, benefits, and alternatives to surgery were briefly reviewed.  The right hip was marked as the correct operative site. All final questions were addressed at this time. The patient was seen by the anesthesiologist who determined it would be safe to proceed with surgery. Intravenous tranexamic acid and antibiotics per the preoperative plan were administered.    The patient was transferred to the operating room where intrathecal and general anesthesia was satisfactorily applied. The patient was then positioned in the lateral decubitus position so that the right hip was facing upwards using pubic and sacral positioners to securely stabilize the pelvis.  Bony prominences and soft tissues around the positioners were well padded. An axillary roll was positioned beneath the down chest wall to avoid neural injury, and TED stocking and SCDs placed on the non-operative leg for mechanical DVT prophylaxis during the procedure.  The non-operative leg was gently flexed at the hip and knee also to avoid neural injury in a position of comfort. The down arm was positioned on a well-padded arm board, and contralateral arm positioned on padding and pillows above in a position of comfort.    The right leg was then prepped using chlorhexidine pre-scrub followed by chlorhexidine applicator sticks. The right leg was then draped in the usual sterile fashion.    A surgical pause and timeout was held confirming the correct patient via identifiers, procedure to be performed, that we were operating on the correct operative site, that preoperative antibiotics  and tranexamic acid were administered, that all appropriate implants were available for the procedure, that all necessary imaging was available for the procedure, and that all safety precautions were being utilized.    To begin the procedure, a skin incision was planned at the  posterior one third of the greater trochanter and slightly curvilinear posteriorly towards the PSIS.  Distally, the incision was planned in line with the axis of the proximal femur.  The skin incision was made sharply using a #10 scalpel.  The subcutaneous tissue was then dissected sharply to the level of the fascia lata and iliotibial band.  The fascia lata was incised in line with the femur and the fibers of the gluteus maximus were carefully split with blunt dissection.  A Charnley retractor was then carefully placed retracting the fascia lata and gluteus maximus fibers. The short external rotators were identified by gently sweeping the overlying adipose tissue posteriorly. The sciatic nerve was palpated well posterior to the surgical field and protected throughout the procedure.  The interval between the piriformis tendon and the posterior edge of gluteus minimus was defined with a Cobb elevator.  The gluteus minimus muscle was retracted with a Deaver retraction, exposing the posterosuperior hip capsule.  The short external rotators and capsule were divided at their insertion on the posterior trochanter, lateral femoral neck and superior acetabulum.  The external rotators and capsule were tagged with #2 Ethibond suture, taken down as one posterior sleeve in a trapezoidal shape and served to retract and protect the sciatic nerve throughout the remainder of the case.  The predislocation leg-length relationship was noted with the knees colinear and measured at the superimposed heels.  The inferior hip capsule was released off the medial neck and the hip was carefully dislocated without difficulty via hip flexion, adduction and internal rotation.    A Cobra retractor was placed around the medial femoral neck to protect the soft-tissue.  The superior lateral shoulder between the femoral neck and greater trochanter was then cleared of any remaining soft tissue debris.  The proposed neck cut was marked at the  previously templated length referencing the lesser trochanter.  The femoral neck resection was then performed using an oscillating saw.  This was completed at the lateral shoulder using caution to avoid trochanteric fracture.  The leg was then positioned in slight internal rotation and gentle flexion to prepare the acetabulum. An anterior acetabular retractor was carefully placed over the anterior acetabular rim just proximal to the equator and a posterior retractor was placed safely in the ischium using caution to retract soft tissues posteriorly to protect the sciatic nerve.  Both retractors were placed in the interval outside the labrum, yet within the hip capsule to avoid any neurovascular injury.   The pulvinar and labrum were excised with Bovie electrocautery to maintain hemostasis.  We then began reaming the acetabulum with hemispherical reamers in a sequential fashion by 2 mm increments to obtain circumferential coverage and an interference fit at a size of 52 mm.  This provided a healthy bleeding subchondral bed at a depth consistent with preoperative templating which would enable a size 52 mm component to achieve an excellent press fit.  The acetabulum was irrigated with pulsavac lavage and any exposed degenerative cysts were curetted and filled with autologous bone graft.  The final highly porous coated hemispherical acetabular component was passed onto the back table in sterile fashion and impacted into place with approximately 20 degrees of anteversion and 45 degrees of inclination, achieving  adequate press-fit and stability.  Full seating of the component was confirmed by checking through the dome hole.  Peri-acetabular osteophytes were resected using osteotomes to remove any extra-articular sources of impingement.  The final highly cross-linked acetabular liner was passed onto the back table in sterile fashion and impacted into place, with locking tabs noted to be fully engaged.  Acetabular retractors  were removed carefully, and we turned our attention to preparation of the femur.     Once again the hip was flexed, internally rotated, and adducted to allow exposure of the proximal femur.  A femoral elevator was used at the posteromedial aspect of the femur to provide direct access to the intramedullary space, Cobra retractors were positioned around the proximal femur to protect the soft tissues based on adequacy of visualization.  The lateral shoulder was carefully cleared of any residual piriformis tendon and capsular tissue.  A box cutting osteotome was used to open the intramedullary space.  A T-handle canal finder was then used to complete opening of the intramedullary space in line with the femur and suction of the canal was performed for decompression.  We then began broaching in sequential fashion to a size 6 broach in slight anteversion relative to native anteversion.  Throughout this process, we were careful to lateralize the broach stem so as to avoid varus malalignment of the final implant.  The final broach was felt to achieve excellent axial and rotational stability and mediolateral metaphyseal fit, so we turned our attention toward trial reduction of components.  The trial head-neck assembly was attached to the broach and the hip was carefully reduced.  The hip was assessed for leg-length based on the predislocation position of the heels with the knees colinear and felt to be satisfactory according to preoperative templating.  The various offset necks and head lengths were utilized to optimize the myofascial tension and minimize extraarticular bony impingement while maintaining the appropriate leg-length, stability and range of motion.  Combined version with the trial implants in was appropriate.  The hip was carefully dislocated with the aide of a T-handled bone hook and the appropriate head-neck trial was removed.  The femoral component rotation was noted and observed to be approximately colinear  with the native femoral neck while optimizing anteversion as able to the limits of the anatomy without compromising implant stability.      Femoral retractors were repositioned and the proximal femur was exposed once again.  Trial implants were removed and the femoral canal was irrigated using pulsatile lavage.  The final size 6 stem was impacted into place with excellent axial and torsional stability. A final trial reduction was performed before taking the final femoral head.  Once again, there was excellent stability through range of motion and restoration of leg lengths.  The final femoral head was then impacted onto the clean and dry Morse taper.  The acetabular liner was irrigated with pulsatile lavage and cleared of any debris. The hip was then carefully reduced.  Leg length restoration appeared satisfactory based on the intraoperative landmarks to replicate the preoperative templating and was confirmed with the heel measurement on the table with the knees colinear.  The hip was stable to full hip extension and external rotation to the limits of the anterior capsule and soft-tissues without any prosthetic impingement.  The hip was stable in the position of sleep, stable in neutral rotation with the knee to the chest and stable with 90-degrees hip flexion and internal rotation to 80 degrees.  A dilute Betadine soak was performed for 3 minutes.  The sleeve of short external rotators and capsule was reattached in anatomic fashion to the undersurface of the greater trochanter and posterior femur with heavy braided suture through drill holes.  The superior capsular edge was approximated to the posterior edge of the abductors with a #1 vicryl suture.  The sciatic nerve was gently palpated along its length and found to be mobile and not under excessive tension or tethering.  Adequate hemostasis was obtained with minimal active bleeding.  A periarticular solution of local anesthestic was injected in the gluteus  maximus and subcutaneous adipose beneath the dermis.  The fascial layer was then closed using interrupted figure-of-8 #1 Vicryl suture and a running #1 barbed monofilament suture.  The dermal layer closed using a running 2-0 barbed monofilament suture, and the epidermal layer closed using a running subcuticular 3-0 monofilament stitch and steri strips.  A sterile Aquacel dressing was applied.    Following the procedure, Ms. Tillmon was satisfactorily awakened from sedation and transferred to the postanesthesia care unit in stable condition. As the attending surgeon, I was physically present and participated during all critical aspects of the above surgical procedure.    Fransisca Connors, MD  Division of Adult Reconstruction  [facility]  Department of Orthopaedics and Rehabilitation

## 2022-12-21 NOTE — Discharge Instructions (Addendum)
Surgical date: 12/21/2022 TOTAL KNEE ARTHROPLASTY      Helpful Telephone Numbers:  Nurse Navigator: [phone] Monday through Friday 8 am to 4:30 pm (for questions and concerns)  After Hours: [phone] (weekends and M-F after 4:30pm) (for questions and concerns that need to be addressed after office hours)  Appointment line: [phone] make, change, or schedule an appointment)  Surgeons office: Dr. Fransisca Connors - Office Phone [phone]    You or your caregiver should call your surgeon's nurse navigator's line at [phone] for:  Redness around the incisions   Continuous drainage or bleeding from incision sites (a small amount of drainage is expected initially)  Temperature above 101 F  Pain not controlled by prescribed pain medications   Any other worrisome condition    If you feel you are having a Medical Emergency please present to the [facility] Emergency Department    You or your caregiver should call your surgeons office for any prescription refill requests.    After hours and on weekends there is always someone on call, available through the answering service [phone] to address your concerns.  Please don't hesitate to reach out with your questions.   In the event that you are unable to contact the office or the answering service, please go to [facility] Emergency Department for evaluation for urgent concerns.    Care[employer] Questionnaire Reminder  Two and four days after surgery, you will receive questionnaires through Care[employer]. Please complete them both using the app or the link in that day's [employer] email. Your Nurse Navigators will be checking in with you as they were before surgery, and your responses will help them assess your progress    Thank you for choosing [facility] for your joint replacement.  Please adhere to the following instructions in order to maximize your recovery.    Please follow the instructions next to any check box that  has been checked i.e. [x]     Activity:  You should be Weight bearing as tolerated on your operative extremity. Walk and stair-climb as tolerated.   Use assistive devices (walker, crutches, cane) as instructed by your Physical Therapist.   Do not lift, carry, push or pull objects heavier than 10 pounds until cleared by your surgeon.   Driving is permitted once you are functionally cleared to do so by your surgeon AND you are no longer taking narcotic pain medications.     Diet:  You may resume your previous diet, as tolerated.   Please aim for protein intake of approximately 100 grams per day to promote healing. (Meats, fish, peanut butter, and Ensure/Boost shakes are all good sources of protein.)  If you feel nauseous or develop vomiting, go back to just clear liquids, increasing to a soft diet and then regular solid foods as tolerated.  Smoking and/or nicotine use are associated with poor wound healing and increased risk of infection. Therefore it is recommended you not smoke or use nicotine for as long as possible following surgery.  If you are diabetic, it is important to keep your blood sugars well-controlled. Poorly controlled blood sugars are associated with poor wound healing and increased risk of infection.     Labs:  [x]    None required.  []    Your *** should be rechecked on ***. Results will be sent to your surgeon's office as well as your PCP.      Preventing Blood Clots (Deep Vein Thrombosis/Pulmonary Embolism or DVT/PE):  Please wear your anti-embolism stockings  for 28 days. They should be worn during the day and removed at night - unless instructed otherwise.     You will be prescribed a blood thinner to help minimize the increased risk of blood clots following surgery. Please refer to your MEDICATION LIST for details. While taking blood thinners, you may find you bruise or develop nose bleeds more easily. You should watch for signs of gastrointestinal bleeding, which may include dark, tar-like stools  or vomit with the appearance of coffee-grounds. You should call your surgeon if you notice these signs.   You have been instructed to take Eliquis 2.5 mg tablet by mouth twice daily for 5 days then resume Eliquis 5 mg by mouth twice daily for deep vein thrombosis prophylaxis (blood clot prevention).     Managing your pain:  Please refer to your MEDICATION LIST for detailed instructions on your personalized pain protocol.  You should apply ice for 30 minutes every 1-2 hours and elevate your extremity above the level of your heart 3 times a day and during rest to help reduce swelling and pain.  Narcotic medications are often necessary for a short time after total joint surgery. While you are taking narcotics:  Do NOT drive or operate heavy machinery.  Do NOT drink alcohol.  Do NOT make any important personal, business, or economic decisions or sign legal documents.  Narcotics can cause side effects such as sedation, lethargy, body-wide itching, nausea, vomiting and constipation.  You should work to gradually wean off of narcotic pain medications as your pain improves, first by decreasing the dose, and then by increasing the amount of time in between doses.    It is important to dispose of all narcotics when you are certain you no longer need them. The first choice for all drug disposal is an authorized medication disposal bin, which is located at the [facility] pharmacy, most retail pharmacies, and law enforcement stations. If you are unable to access one of these bins, you should flush unused narcotics down the toilet.  Please call your surgeon's office M-F BEFORE 4:00pm for prescription refill requests    Post-operative course:  Swelling:  It is common to have swelling in your knee up to 6 to 12 months after surgery depending on your activity.   You may also experience a stiff knee in the morning and swollen in the evening.     Bruising:  It is common and normal for bruising to occur  post-operatively.  Bruising can develop on your thigh, calf, foot and ankle following surgery  Bruises can be painful to touch as they resolved and can take several weeks to disappear.    Ice & Elevation:  Icing is essential for pain/swelling reduction and should be continued as needed for months after surgery.   Apply ice for 20-30 minutes at a time, with a minimum of a 30-minute break in between the icing sessions.   Combine icing with elevation of your extremity.   Elevate your foot on pillows so your foot is above the level of your heart and your ankle is above your knee.   Compression:  Frequent use of wearing an ACE wrap is strongly encouraged to help reduce swelling post-operatively.  For 2 weeks after surgery, use an ACE wrap on your surgical leg for a maximum of 12 hours per pay.  Adjust ACE wrap as needed if it feel too tight or too loose.     Constipation:  After surgery you are at increased risk  for constipation because of reduced movement and activity levels as well as opioids/narcotics.   To prevent/alleviate constipation, you should walk as much as you can tolerate and drink plenty of fluids. You should also use a stool softener such as Colace (docusate sodium) and a laxative such as Senokot (senna) unless you develop diarrhea or loose stool.  These two medications have been prescribed to you.  See your MEDICATION LIST for details.    If you have not had a bowel movement by your 3rd day after surgery, please consider adding Miralax, or taking magnesium citrate to help facilitate a bowel movement. Utilize your pharmacist, or please call your PCP/surgeon's office, if you have any concerns about the addition of these medications to your current home regimen.    Caring for your incision:   Your incision is covered with a surgical dressing called Aquacel.  Please remove dressing at 14 days (2 weeks) after surgery.   If there is no drainage from the incision, the incision may be left open to air.   If there  is drainage cover incision with a clean dry dressing and contact your surgeon.   If you need help removing the dressing, please discuss with your homecare services.  If your notice any drainage on the dressing that is >80% soiled, or has become mostly unattached from the skin, remove and cover with a dry dressing and tape until follow-up. Do not shower and do not allow incision to get wet  Showering:  Do not shower if you have drainage from your incision. You may sponge bathe. Do not scrub your incision.  Initially you may shower with intact dressing in place. If there's any sign that the dressing is peeling at any corner, or wrinkled, cover with plastic wrap before showering.   Do not soak your wound in a bath, whirlpool, or in any kind of standing water.   When the initial dressing is removed and if there is no drainage you may continue to shower. Pat your incision dry, no not scrub the incision site.   You do not have any surgical staples or sutures that require removal.    Follow-up:  Call your surgeon's office at [phone] to make a follow-up appointment.     Other:  Your surgeon requests that you do not receive any vaccinations (such as flu and covid vaccines) until 6 weeks have passed since your surgical date  Please take Doxycycline 100mg  by mouth twice daily for 14 days for extended antibiotic prophylaxis. It is important to take this medication with a full glass of water to avoid esophageal irriation. Avoid sun exposure. Review and follow rest of antibiotic insert education as well.

## 2022-12-22 ENCOUNTER — Other Ambulatory Visit: Payer: Self-pay

## 2022-12-22 LAB — POCT GLUCOSE
Glucose POCT: 174 mg/dL — ABNORMAL HIGH (ref 60–99)
Glucose POCT: 155 mg/dL — ABNORMAL HIGH (ref 60–99)

## 2022-12-22 MED ORDER — OXYCODONE HCL 5 MG PO TABS *I*
5.0000 mg | ORAL_TABLET | ORAL | 0 refills | Status: DC | PRN
Start: 2022-12-22 — End: 2023-01-18
  Filled 2022-12-22: qty 42, 7d supply, fill #0

## 2022-12-22 NOTE — Progress Notes (Signed)
SW faxed over AVS and D/C summary to [facility] home care at [phone]  Alden Server, LMSW  12/22/2022  10:54 AM

## 2022-12-22 NOTE — Progress Notes (Signed)
Patient has cleared PT, OT, is voiding spontaneously, and has had x-ray completed.  Patient has achieved adequate pain control and is amenable to discharge.  Discharge instructions reviewed with patient and patient stated understanding.  IV removed.  Patient discharged to home as per order.  Darcie Mellone K Jerald Villalona, RN

## 2022-12-22 NOTE — Progress Notes (Signed)
Physical Therapy    Treatment session completed.  Patient is functionally appropriate to safely discharge home at this time.      Patient provided with written HEP and walking protocol. Patient verbalizes understanding of walking program, demonstrates understanding of HEP exercises.    Is another physical therapy session necessary prior to hospital discharge:  No    Discharge recommendation:  Anticipate return to prior living arrangement, Intermittent supervision/assist, Home PT, Recommend patient be seen again by physical therapy PRIOR to discharge  Equipment recommendations upon discharge: None  Mobility recommendations for nursing while in hospital: 1A with RW     PT Adult Assessment - 12/22/22 0900          PT Tracking    PT TRACKING PT Assigned     Type of Session follow up/treatment     SW Request? No        Treatment Day    Treatment Day (HH / URR) 2     Additional Comments Denies dizziness throughout session, when asked at end of session patient states constant dizziness throughout entire OT and PT session.  Patient was educated on importance of notifying and telling appropriate hospital staff about symptoms that way occur.  Patein verbalized understanding stating "I did not think it was bad at all and was able to tolerate everything"        Precautions/Observations    Precautions used Yes     Total Hip Replacement Patient able to verbalize precautions of ADduction;Patient able to verbalize precautions of Internal rotation;Patient able to verbalize precautions of Flexion;Surgical Approach: Posterior lateral     Weight Bearing Status Right Lower Extremity - weight bearing as tolerated     LDA Observation None     Was patient wearing a mask? No     PPE worn by Clinical research associate [facility]     Fall Precautions General falls precautions;Patient educated to use call light for nursing assist prior to getting up;White board updated with appropriate mobility status        Current Pain Assessment    Pain Assessment /  Reassessment Assessment     Pain Scale 0-10 (Numeric Scale for Pain Intensity)      0-10 Scale 8     Pain Location/Orientation Hip Right     Pain Descriptors Aching;Constant     (T) Pain Intervention(s) for current pain Repositioned;Cold applied;Ambulation/increased activity        Vision     Current Vision Adequate for PT session        Cognition    Cognition Tested     Arousal/Alertness Appropriate responses to stimuli     Orientation Alert and oriented x4     Ability to Follow Instructions Follows all commands and directions without difficulty        Bed Mobility    Bed mobility Tested     Supine to Sit Modified independent (device)     Sit to Supine Modified independent (device)     Additional comments No difficulties noted        Transfers    Transfers Tested     Sit to Stand Modified independent (device)     Stand to sit Modified independent (device)     Transfer Assistive Device rolling walker     Additional comments Verbal cues for hand placement.  No instances of LOB, denies dizziness or lightheadedness        Mobility    Mobility Tested     Weight Bearing Status Right Lower  Extremity - weight bearing as tolerated     Gait Pattern 3 point;Decreased cadence;Decreased L step length;Decreased R step length;Decreased R step height;Decreased L step height     Ambulation Assist Modified independent (device)     Ambulation Distance (Feet) 320     Ambulation Assistive Device rolling walker     Additional comments Patient able to ambulate to and from therapy gym without difficulty.  Patient educated on functional ambulation techniques with emphasis on taking equal heel to toe steps, patient was able to demonstrate understanding without difficulty        Family/Caregiver Training`    Patient/Family/Caregiver training Yes     Family/caregiver training Role of physical therapy in hospital and plan for evaluation and follow up;Discharge planning;Purpose and importance of icing and recommendations for ice frequency and  duration;Use of assistive device;Therapeutic exercises, including recommendation of frequency of therapeutic exercises to be completed by patient throughout day;Benefits of oob activity and mobility during hospitalization, including frequency of ambulation and change of position;Call don't fall, purpose of bed/chair alarm, and recommendation for nursing assistance during all oob mobility     Patient training Role of physical therapy in hospital and plan for evaluation and follow up;Discharge planning;Purpose and importance of icing and recommendations for ice frequency and duration;Use of assistive device;Therapeutic exercises, including recommendation of frequency of therapeutic exercises to be completed by patient throughout day;Benefits of oob activity and mobility during hospitalization, including frequency of ambulation and change of position;Call don't fall, purpose of bed/chair alarm, and recommendation for nursing assistance during all oob mobility        Therapeutic Exercises    Exercises Performed Functional Exercises     Supine heel slides 5x RLE   while maintaining hip precautions    Sit to stand 5x from low mat table   while maintaining hip precautions    Standing heel/toe raises 5x BLE   while maintaining hip precautions    Standing marching 5x BLE   while maintaining hip precautions       Balance    Balance Tested     Sitting - Static Independent;Unsupported     Sitting - Dynamic Independent;Unsupported     Standing - Static Independent;Unsupported     Standing - Dynamic Independent;Supported        Functional Outcome Measures    Functional Outcome Measures Yes        PT AM-PAC Mobility    Turning over in bed? None: Modified Independence/independent     Moving from lying on back to sitting on the side of the bed? None: Modified Independence/independent     Moving to and from a bed to a chair? None: Modified Independence/independent     Sitting down on and standing up from a chair with arms? None:  Modified Independence/independent     Need to walk in hospital room? None: Modified Independence/independent     Climbing 3 - 5 steps with a railing? None: Modified Independence/independent     Total Raw Score 24     Standardized Score - Calculated 61.14     % Functional Impairment - Calculated 0%        Assessment    Brief Assessment Remains appropriate for skilled therapy     Problem List Impaired LE ROM;Impaired LE strength;Impaired endurance;Impaired balance;Impaired transfers;Impaired ambulation;Impaired bed mobility;Impaired stair navigation        Plan/Recommendation    PT Treatment Interventions Restorative PT     PT Frequency 5-7x/wk     PT Mobility  Recommendations 1A with RW     PT Referral Recommendations OT;SW;Home care     PT Discharge Recommendations Anticipate return to prior living arrangement;Intermittent supervision/assist;Home PT;Recommend patient be seen again by physical therapy PRIOR to discharge     PT Discharge Equipment Recommended None     PT Assessment/Recommendations Reviewed With: Nursing;Patient;Family     Next PT Visit Cleared PT; progress per protocol     PT needs to see patient prior to DC  No        Time Calculation    PT Timed Codes 29     PT Untimed Codes 0     PT Unbilled Time 0     PT Total Treatment 29        Plan and Onset date    Plan of Care Date 12/21/22     Onset Date 12/21/22     Treatment Start Date 12/21/22                     AMPAC Score Interpretation:  Adapted from Daniel L. Young et al [employer] learning prediction of hospital patient need for post-acute care using admission mobility measure is robust across patient diagnoses. Health Policy and Technology, volume 12, issue 2, June 2023, 100754     Patients 66 years or older with an AM-PAC standardized score of <31 have an increased likelihood of requiring post-acute care (76%)  Patients that are less than 71 years old with an AM-PAC standardized score of <31 (41-59%) or, patients that are>14 years old with an AM-PAC  standardized score >/=31 (37-50%) have a moderate likelihood of requiring post-acute care   Patients with an AM-PAC standardized score of >43 have an increased likelihood for discharge to home regardless of age. (5-12%)    Laniyah Rosenwald, PTA  Please secure chat to communicate if needed.

## 2022-12-22 NOTE — Discharge Summary (Signed)
Name: Carly Higgins MRN: [id number] DOB: 02/27/1960     Admit Date: 12/21/2022   Date of Discharge: 12/22/2022     Patient was accepted for [facility] to   [facility]       Discharge Attending Physician: Fransisca Connors, MD      Hospitalization Summary    Concise Narrative: Carly Higgins was admitted for elective arthroplasty. Their post operative course was without significant events. Patient was discharged to home.         OR Procedure: Right Total Hip Arthroplasty 12/21/2022                     Significant Med Changes: Yes  Chemical DVT prophylaxis was initiated with reduced dose eliquis 2.5mg  PO BID x 5 days, followed by prior home dose/frequency due to hx of AFib.  Det[facility]. Analgesics tailored to tolerance and adequate pain management.  Meloxicam not utilized secondary to elevated anticoagulation  Antibiotics: Doxycycline 100mg  PO BID x 14 days for surgical prophylaxis               Signed: Lorenza Evangelist, PA  On: 12/22/2022  at: 10:12 AM

## 2022-12-22 NOTE — Progress Notes (Signed)
Occupational Therapy    Initial Eval Completed.  Patient is a 62 y.o.female who presents with s/p R THA on 12/21/22.  Patient has been seen and is cleared for OT.     Does OT need to see patient PRIOR to D/C: No    Discharge recommendation:   Prior Living Environment, Intermittent supervision   Equipment recommendations: None   Hospital Stay Recommendations for nursing: 1A with RW    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    Conditioned w/ Busulfan 130 mg/m2/d and Fludarabine 40 mg/m2/d x4 days followed by 10/10 HLA MUD (F) Allogeneic PBSCT (pt/donor: ABO: A+/A+, CMV N/N)    Pulmonary edema     s/p L THA 10/20/21 10/20/2021    Shortness of breath     Tobacco use        Past Surgical History:   Procedure Laterality Date    BONE MARROW TRANSPLANT  2017    Mediport insertion      PR ARTHRP ACETBLR/PROX FEM PROSTC AGRFT/ALGRFT Left 10/20/2021    Procedure: LEFT POSTERIOR ARTHROPLASTY, HIP, TOTAL;  Surgeon: Fransisca Connors, MD;  Location[facility];  Service: Orthopedics    PR AXILLARY LYMPHADENECTOMY COMPLETE Left 01/24/2021    Procedure: sentinel lymph node biopsy;  Surgeon: Sheliah Mends, MD;  Location[facility];  Service: Oncology General    PR CARDIOVERSION ELECTIVE ARRHYTHMIA EXTERNAL N/A 09/24/2022    Procedure: Cardioversion;  Surgeon: Katherine Roan, NP;  Location: [facility] LABS;  Service: Cardiovascular    PR MASTECTOMY PARTIAL Left 01/24/2021     Procedure: LEFT NEEDLE LOC PARTIAL MASTECTOMY   ;  Surgeon: Sheliah Mends, MD;  Location: [facility];  Service: Oncology General    ROTATOR CUFF REPAIR Right        *Bold Indicates co-morbidities affecting treatment and recovery    Occupational Profile relating to the present problem:  Lives alone  Advanced age  Caregiver limitations  Needs assistance for mobility    In addition to the PMH and surgical history listed above, the comorbidities affecting treatment/ recovery:   none noted    Performance deficits that result in activity limitations and/or performance restrictions:  Decrease in balance, endurance, mobility, functional transfers, and ADLs/IADLs.       Modification of tasks needed or assistance with assessments necessary to enable completion of evaluation:  Mod/I with UB and LB Dressing with appropriate use of AE while maintaining hip precautions.     Patient complexity:  low level as indicated by above personal factors, environmental factors and comorbidities in addition to their impairments found on physical exam.       OT Assessment (HH / URR) - 12/22/22 0750          OT Tracking  (HH / URR)  OT Tracking (HH / URR) OT Discontinue     Type of Session evaluation     SW Request? No        OT Last Visit    Visit (#)  0        Precautions    Precautions used Yes     Total Hip Replacement Pt verbalized hip precautions;Posterior lateral     Weight Bearing Status Right Lower Extremity - Weight bearing as tolerated     Fall Precautions General falls precautions;Visitor present   Mother present    Chief Operating Officer PPE including Gloves;Mask     Activity Order Activity as tolerated        Home Living (Prior to Admission)    Prior Living Situation Reported by patient;Obtained via chart review     Type of Home Apartment     # Steps to Enter Home 0   elevator, has emergency stair well    # Of Steps In Home 0     Location of Bedroom First floor     Location of Bathroom First floor     Bathroom Shower/Tub Walk-in  shower     Current Home Equipment Grab bars in shower/tub;Cane (straight);Event organiser;Walker (rollator);Walker (rolling);Commode (3 in 1)        Prior Function    Prior Function Reported by patient;Obtained during chart review     Level of Independence Independent with ADLs;Independent with ADL functional transfers;Independent with assistive device;Independent with homemaking with ambulation;Driving   used rollator in the house and in the community    Lives With Alone     Receives Help From Independent;Family   will be staying at [facility] for [facility] days, has siblings to call when needing help.    IADL Independent     Vocational Retired        Current Pain Assessment    Pain Assessment / Reassessment Assessment     Pain Scale 0-10 (Numeric Scale for Pain Intensity)      0-10 Scale 8     Pain Location/Orientation Hip Right     (T) Pain Intervention(s) for current pain Repositioned;Cold applied        Vision     Current Vision Wears corrective lenses   reading       Cognition    Cognition No deficit noted     Level of Alertness Appropriate responses to stimuli     Orientation Alert and oriented x4     Attention  Appears intact     Memory Appears intact     Following Commands Follows one step commands 100% of the time        Medication Management    Medication Management System Yes     Uses Pill box        Perception    Perception No deficit noted        Sensation    Sensation Deficit noted   neuropathy in feet       UE Assessment    UE Assessment Full AROM RUE;Full AROM LUE        Bed Mobility    Supine to Sit Modified Independent;HOB elevated     Additional Comments Pt was greeted in bed and ended session in recliner with call bell within reach.        Functional Transfers    Sit to Stand Modified independent;Rolling walker     Stand to Sit Modified independent;Rolling walker     Toilet Transfers Modified Independent  Transfer Device(s) Rolling walker        DME Used Commode;Grab bar      Functional Mobility Modified Independent;Rolling Walker     Additional Comments Pt ambulated ~30 feet with RW.        Balance    Sitting - Static Independent      Sitting - Dynamic Independent     Standing - Static Independent;Supported     Standing - Dynamic Independent;Supported     Standing Tolerance during Functional Task Fair     Additional Comments No overt LOB, denied dizziness and lightheadedness. However, per PT report, pt told PT that she was dizzy for the whole OT session.        ADL Assessment    Oral Care Set up        Assist Needed With Set up of materials        Where Oral Care Assessed Standing at sink        Equipment Used RW     UE Dressing Independent     LE Dressing Modified independent        Assist Needed With Decreased safety;Increased time;Use of adaptive equipment        Where LE Dressing Assessed Edge of bed        Equipment Used Reacher;Walker     Additional Comment(s) Pt able to donn LB with reacher with compensatory strategies. Pt was educated on TEDs wear and care schedule. Pt required increased time and set up of materials for oral care at sink. Pt was able to maintain hip precautions throughout session with use of AE appropriately.        IADL Assessment    IADL Needs assistance        Activity Tolerance    Endurance Tolerates 30+ min activity without fatigue        Functional Outcomes Measures    Functional Outcome Measures Yes        OT AM-PAC Self Care    Putting on and taking off regular lower body clothing? 4     Bathing (including washing, rinsing, drying)? 3     Toileting, which includes using toilet, bedpan, or urinal? 4     Putting on and taking off regular upper body clothing? 4     Taking care of personal grooming such as brushing teeth? 4     Eating Meals 4     Total Raw Score 23     CMS Score - Calculated 15.86%        Assessment    Assessment Impaired ADL status;Impaired balance;Impaired Safe judgement during ADL;Impaired endurance;Impaired self-care transfers;Impaired  instrumental ADL's        Plan    OT Frequency OT Discontinue     No acute OT needs Pt demonstrates adequate ADL skills for return to prior living environment        Recommendation    OT Discharge Recommendations Prior Living Environment;Intermittent supervision     OT Discharge Equipment Recommended None     OT Hospital Stay Recommendations 1A with RW     OT needs to see patient prior to DC  No        Multidisciplinary Communication    Multidisciplinary Communication PT, RN, pt, pt mother        Patient/Family/Caregiver Training    Patient/Family/Caregiver training Yes     Patient/Family/Caregiver training Role of occupational therapy in hospital and plan for evaluation;Discharge planning;Purpose of and importance of icing and recommendations for frequency;Use  of assistive device;Use of Adaptive equipment;Call don't fall, purpose of bed/chair alarm, and recommendations for nursing;Compression stocking education and wearing schedule        Time Calculation    OT Timed Codes 0     OT Untimed Codes 34     OT Unbilled Time 0     OT Total Treatment 34        Plan and Onset date    Plan of Care Date 12/22/22     Onset Date 12/21/22     Treatment Start Date 12/22/22                       AMPAC Score Interpretation:  Adapted from Roxanne Gates, et al Association of AM-PAC "6 Clicks" Basic Mobility and Daily Activity Scores with discharge destination, 2021  AM-PAC Raw Score of > 19 (standardized score > 40.22 %) indicates a high likelihood of discharge to home     Adapted from [employer] al "Validity of h AM-PAC "6- Clicks" Inpatient Daily Activity and Basic Mobility Short Forms (2014)   AM-PAC raw score  >= 18 (standardized score >= 39, % impairment  < 47%) indicates a high probability of a discharge to home  AM-PAC raw score  >= 13 (standardized score >= 32.03, % impairment 63%) indicates pt is more likely to require IRF/SNF R  AM-PAC raw score <12 (standardized score < 30.6 % impairment > 66%) indicates pt is more likely  to require Long Term Care    Inda Castle, OT   Please utilize Secure Chat for communication

## 2022-12-22 NOTE — Progress Notes (Addendum)
Orthopaedic Surgery Progress Note  Patient:Carly Higgins  MRN: [id number]  DOA: 12/21/2022    Subjective:  Bgs slightly elevated otherwise NAEO. VSS. Pain well controlled. Tolerating PO. Voiding spontaneously. No BM. +flatus. Denies fever/chills, shortness of breath/chest pain, nausea/vomiting, new numbness or tingling (baseline BLE neuropathy).    Medications    Scheduled:    acyclovir  400 mg Oral 2 times per day    atorvastatin  40 mg Oral QPM    escitalopram  10 mg Oral Nightly    levothyroxine  50 mcg Oral Daily @ 0600    metoprolol succinate ER  100 mg Oral BID    pregabalin  100 mg Oral 2 times per day    docusate sodium  200 mg Oral Daily    senna  2 tablet Oral Nightly    polyethylene glycol  17 g Oral Daily    acetaminophen  1,000 mg Oral 3 times per day    apixaban  2.5 mg Oral 2 times per day    famotidine  20 mg Oral 2 times per day    insulin lispro  0-20 units Subcutaneous TID WC    doxycycline hyclate  100 mg Oral 2 times per day       Continuous Infusions:    sodium chloride         PRN: sodium chloride, dextrose, sodium chloride, dextrose, magnesium hydroxide, ondansetron, oxyCODONE **OR** oxyCODONE, calcium carbonate, camphor-menthol, Juice, dextrose, dextrose, glucagon, haloperidol lactate    Vital Signs    Current Vitals Vitals Range (24 hours)   BP 125/64 (BP Location: Left arm)   Pulse 59   Temp 36.4 C (97.5 F) (Infrared)   Resp 15   Ht 1.676 m (5\' 6" )   Wt 117.9 kg (260 lb)   SpO2 96%   BMI 41.97 kg/m  BP: (80-125)/(53-77)   Temp:  [36 C (96.8 F)-36.7 C (98.1 F)]   Temp src: Infrared (12/13 2330)  Heart Rate:  [59-101]   Resp:  [10-22]   SpO2:  [94 %-98 %]   Height:  [167.6 cm (5\' 6" )]   Weight:  [117.9 kg (260 lb)]      Intake/Output:  Date 12/21/22 0700 - 12/22/22 0659 12/22/22 0700 - 12/23/22 0659   Shift 0700-1459 1500-2259 2300-0659 24 Hour Total 0700-1459 1500-2259 2300-0659 24 Hour Total   INTAKE   P.O. [phone] 1640         P.O. [phone] 1640        I.V.(mL/kg/hr) 2014.1(2.1)   2014.1         Volume (ml) Propofol  89.1   89.1         Volume (mL) (Lactated Ringers Infusion) 925   925         Volume (mL) (sodium chloride 0.9 % IV) 1000   1000       IV Piggyback 389.7 354.2  743.9         Volume (mL) (vancomycin (VANCOCIN) 1,500 mg in sodium chloride 0.9% IVPB 275 mL) 369.7   369.7         Volume (mL) (ceFAZolin (ANCEF) 2,000 mg in sterile water (PF) IV syringe 20 mL) 20   20         Volume (mL) (vancomycin (VANCOCIN) 1,500 mg in sodium chloride 0.9% IVPB 275 mL)  354.2  354.2       Shift Total(mL/kg) 4540.9(81.1) 754.2(6.4) 1000(8.5) 9147(82.9)       OUTPUT   Urine(mL/kg/hr)  700(0.7)  500 1200         Urine  [phone]         Urine Occurrence 1 x 1 x  2 x         Pre-Void Bladder Scan Reading 224 ml   224 ml       Emesis/NG output             Emesis Occurrence 0 x   0 x       Stool             Stool Occurrence 0 x 0 x  0 x       Blood 200   200         Blood Loss 200   200       Shift Total(mL/kg) 200(1.7) 700(5.9) 500(4.2) 1400(11.9)       NET 2443.8 54.2 500 2998       Weight (kg) 117.9 117.9 117.9 117.9 117.9 117.9 117.9 117.9       Drain Output: na    Physical Exam:  General: Alert, no acute distress, supine in bed.     RLE:   Aquacel with small pinpoint strikethrough otherwise clean, dry, intact.   Motor intact hip, knee flexion/extension, ankle dorsiflexion/plantarflexion, toe flexion/extension.   Sensation baseline to light touch 1st dorsal web space/medial/lateral/plantar/dorsal foot.   Palp dorsalis pedis/posterior tibialis pulse, toes warm and well-perfused, < 3 second capillary refill.    Laboratory Studies    Recent Labs   Lab 12/21/22  1543   Sodium 133   Potassium 4.6   Chloride 101   CO2 22   UN 23*   Creatinine 0.85   Glucose 194*   Calcium 8.9    Recent Labs   Lab 12/21/22  1543   Hemoglobin 11.4   Hematocrit 37    No results for input(s): "TB", "DB", "AST", "ALT", "ALK", "TP", "ALB", "PALB", "AMY", "LIP" in the last 168 hours.      No  results for input(s): "APTT", "INR", "PTT" in the last 168 hours.    Microbiology:  na    Imaging:  Reviewed, appropriate alignment of THA. No Fracture or complication.     Assessment/Plan: Carly Higgins is a 62 y.o. female  who is now s/p R THA     RTOR not anticipated  Pain control: Multmodal  Bowel regimen   Hct: 37   Diet reg  Antibiotics periop then PO   Weight-bearing status: WBAT RLE  PT/OT & OOB  DVT ppx eliquis  Dispo: Today, pending PT/OT/Pain Control    Clearance Coots, MD  Orthopaedic Surgery Resident  12/22/2022 6:30 AM    I saw and evaluated the patient. I agree with the resident's/fellow's/APP's findings and plan of care as documented above.     Fransisca Connors, MD  Division of Adult Reconstruction  [facility]  Department of Orthopaedics and Rehabilitation

## 2022-12-22 NOTE — Progress Notes (Signed)
PCP    Reviewed notes  Appreciate excellent care    [facility], MD

## 2022-12-22 NOTE — Plan of Care (Signed)
As a licensed physician I herby certify this patient's eligibility for home health services:    The patient needs intermittent Skilled Nursing care and Physical Therapy    The patient is confined to the home (that is, homebound)    - in need the aid of supportive devices such as crutches, canes, wheelchairs, and walkers    - needs  the assistance of another person to leave their place of residence   - Leaving home requires a considerable and taxing effort    Absences from the home are one or more of the following:   -infrequent;  ?for periods of relatively short duration;  ?for the need to receive health care treatment;  ?for religious services;  ?to attend adult daycare programs; or  ?for other unique or infrequent events (e.g., funeral, graduation, trip to the barber).    A plan of care has been established and will be periodically reviewed by me, a licensed physician    Services will be furnished while the individual  is under the my care     This is a face-to-face encounter during this patient's current hospital admission for joint replacement. The certification period begins at the time of hospital discharge.    Emanuel Campos, MD  Division of Adult Reconstruction  [facility]  Department of Orthopaedics and Rehabilitation

## 2022-12-23 ENCOUNTER — Telehealth: Payer: Self-pay | Admitting: Internal Medicine

## 2022-12-23 NOTE — Telephone Encounter (Signed)
Discharge phone call      Discharge facility: [facility]      Discharge Diagnosis:  S/p Hip replacement       Discharge review:  Understands the instructions on the discharge paperwork  No concerns    Understand instructions/Questions:  Pain control - controlled - one oxycodone   Knows what to use   Bm/ urine going well   No concerns/questions    Follow-up plan needed:   Orthopedics on 01/17/22  Knows to call the office if needs anything from [place]    Lars Pinks, MD  10:29 AM  12/23/2022

## 2022-12-24 ENCOUNTER — Telehealth: Payer: Self-pay | Admitting: Orthopedic Surgery

## 2022-12-24 NOTE — Telephone Encounter (Signed)
Samuel Bouche, PT HCR.  He started care for patient, and reports that patient has not had a Bowel movement since 12/20/22. Patient had stopped taking the stool softener, but will start taking her medication again.  If she continues with constipation, she has been instructed to call us.    Thanks

## 2022-12-24 NOTE — Anesthesia Postprocedure Evaluation (Signed)
Anesthesia Post-Op Note    Patient: Carly Higgins    Procedure(s) Performed:  Procedure Summary  Date:  12/21/2022 Anesthesia Start: 12/21/2022  7:29 AM Anesthesia Stop: 12/21/2022  9:25 AM Room / Location:  H_OR_10 / [facility] MAIN OR   Procedure(s):  RIGHT POSTERIOR ARTHROPLASTY, HIP, TOTAL Diagnosis:  Primary osteoarthritis of right hip [M16.11] Surgeon(s):  Fransisca Connors, MD  Birdena Crandall, MD Responsible Anesthesia Provider:  Lillie Fragmin, MD         Recovery Vitals  BP: 103/69 (12/22/2022  7:50 AM)  Heart Rate: 65 (12/22/2022  7:50 AM)  Heart Rate (via Pulse Ox): 66 (12/21/2022 12:00 PM)  Resp: 18 (12/22/2022  7:50 AM)  Temp: 36.5 C (97.7 F) (12/22/2022  7:50 AM)  SpO2: 98 % (12/22/2022  7:50 AM)   0-10 Scale: 8 (12/22/2022  9:00 AM)    Anesthesia type:  general  Complications Noted During Procedure or in PACU:  None   Comment:      Responsible Anesthesia Provider Attestation:  All indicated post anesthesia care provided         Complications Noted During Recovery Period:      None  Recovery from Anesthesia:      Recovered to baseline level of consciousness  Condition of patient:      Recovered to pre-anesthetic condition

## 2022-12-25 ENCOUNTER — Encounter: Payer: Self-pay | Admitting: Orthopedic Surgery

## 2023-01-14 ENCOUNTER — Encounter: Payer: Self-pay | Admitting: Gastroenterology

## 2023-01-16 ENCOUNTER — Telehealth: Payer: Self-pay | Admitting: Thoracic Diseases

## 2023-01-17 ENCOUNTER — Encounter: Payer: Self-pay | Admitting: Thoracic Diseases

## 2023-01-18 ENCOUNTER — Ambulatory Visit
Admission: RE | Admit: 2023-01-18 | Discharge: 2023-01-18 | Disposition: A | Discharge status: Home / Self Care | Payer: Medicare Other | Source: Ambulatory Visit

## 2023-01-18 ENCOUNTER — Ambulatory Visit: Payer: Medicare Other | Attending: Orthopedic Surgery

## 2023-01-18 ENCOUNTER — Other Ambulatory Visit: Payer: Self-pay

## 2023-01-18 ENCOUNTER — Other Ambulatory Visit: Payer: Self-pay | Admitting: Thoracic Diseases

## 2023-01-18 VITALS — BP 97/61 | Ht 66.0 in | Wt 262.0 lb

## 2023-01-18 DIAGNOSIS — M1611 Unilateral primary osteoarthritis, right hip: Secondary | ICD-10-CM

## 2023-01-18 DIAGNOSIS — Z471 Aftercare following joint replacement surgery: Secondary | ICD-10-CM

## 2023-01-18 DIAGNOSIS — Z96641 Presence of right artificial hip joint: Secondary | ICD-10-CM

## 2023-01-18 MED ORDER — PREGABALIN 150 MG PO CAPS *I*
150.0000 mg | ORAL_CAPSULE | Freq: Two times a day (BID) | ORAL | 0 refills | Status: DC
Start: 2023-01-18 — End: 2023-01-22

## 2023-01-18 NOTE — Patient Instructions (Signed)
4-Week Post-Operative Instructions    It is common to have pain and swelling after your joint replacement surgery months after the surgery, especially each time you increase your activity. Each patient will recover at a different rate, so please do not be discouraged or concerned if you are not as far along in the recovery as you would like.  In order to get the most out of your joint replacement, you must follow the instructions below:    Ice & Elevation:  Icing is essential for pain/swelling reduction and should be continued as needed for months after surgery. Use 20-30 minutes at a time, with a 30-minute break in between the icing sessions.  Combine with elevation of knee above your heart for best results.  If you underwent hip replacement, true elevation of the joint in this manner is challenging, but elevation of the extremity will help with your recovery regardless.    Elevate your foot on pillows so your foot is above the level of your heart. If your foot is not above the level of your heart it does not count as elevating and will not be effective!           Medications: The following two medications (anti-inflammatory and acetaminophen) should be taken the next 4-6 weeks if allowed by your primary care physician.  Using this medication combination should allow you to continue to wean off of the postoperative narcotic pain medication.  Some studies have demonstrated that this medication combination can be as effective as a narcotic for postoperative pain control.        Assist Devices: Unless you have been instructed otherwise you may discontinue your current assist device, as you feel safe. If using a walker you may progress to a cane. If using a cane you can progress to walking without any assist device. If you have problems with balance you should continue with the assist device to be safe.    Return to Normal Activities & Exercise:  As you begin to increase your activities and exercising regularly,  stationary biking, elliptical machines or swimming are great exercises to minimize traumatic impact to your new joint replacement, while building stamina, endurance and cardiovascular health.  As you return to normal activities, keep in mind that your postoperative restrictions can help guide you how to safely perform certain tasks if you had your hip replaced.    Stationary Biking/Elliptical Machine:  Start 2-3 times a week, 15 minutes max, low resistance (easy to pedal)  Slowly build up to 4-5 times a week for 30 minutes; Increase  resistance slowly as tolerated.    Swimming (once your incision is completely healed and all scabs are gone):  Start 2-3 times a week for 15 min.  Build to 4-5 times a week for 30 min. as tolerated    Going to the Dentist: Do not resume dental work, including routine cleanings, until three months from your surgery date. The American Academy of Orthopedic Surgeons no longer recommends routine antibiotic use before invasive dental procedures.  In some select patients, there may be a benefit to taking preventative antibiotics if risk is high enough.  If you meet any of the below criteria, you may benefit from antibiotics before these procedures, but discuss with your orthopedic surgeon:   You are severely immunocompromised (on chemotherapy or long term oral steroids)  You have previously been treated with surgery for an infection of a replaced joint  You have poorly controlled diabetes (Hemoglobin A1c > 8)      Incisional care: Continue to monitor your incision daily and ensure full healing.  Don't soak your incision (soak in a bath, sit in a hot tub) until your incision is completely healed without scabs. This generally occurs around 6 weeks after your surgery.  Notify Dr. Kaplan's office if your incision isn't healing properly or if there is persistent drainage.    Return to driving: You may return to driving once you have weaned off your narcotic pain medication (you may NOT drive if you  are taking narcotic pain medication), and you feel strong enough to safely press the brake pedal in an emergency.  Good general practice is to practice driving in an empty parking lot to ensure you can safely switch between the gas and brake pedals with an appropriate reflex response time.    Traveling after joint replacement:  In general, many patients feel ready within a few weeks of surgery.  As long as you understand the risks, and take appropriate preventative measures, traveling is safe generally about 4 weeks after surgery.  The risk of forming a blood clot that can have devastating consequences is highest in this window.  You will be taking a blood thinner and using mechanical compression devices to minimize risk during this period.  Further measures you can take include frequent ambulation (every 1-2 hours), performing your ankle pump and calf squeeze exercises, frequent stretching, and stay adequately hydrated.  These measures can be taken while traveling by car or airplane, and are strongly recommended if urgent travel is planned during this period.  As a side note, your implant is likely to set off the metal detectors in airport security. If you notify the TSA agents prior to screening, they may elect screen using a body scanner to avoid any further extensive screening (i.e. pat down).

## 2023-01-18 NOTE — Progress Notes (Signed)
Total Hip Replacement, Follow-Up Visit    HPI: Carly Higgins returns to the office approximately 4 weeks after a right total hip replacement on 12/21/2022 with Dr. Arlyce Dice, and reports that they are doing relatively well, and overall is pleased with their early outcome.  She has minimal complaints of pain and is gradually increasing her daily activity. She has completed working with in-home PT. They deny any acute events such as fall. They deny any recent fevers, chills, or other constitutional signs of illness or infection. They deny any peri-incisional redness or drainage.  Of note, she is leaving for [place] tomorrow and will be gone until May.     PHYSICAL EXAMINATION:    Physical examination of the right hip reveals an incision that is clean, dry and intact with minimal redness or erythema.   The patient walks with a reasonable gait and Mild antalgia.    The hip has painless range of motion with flexion up to 90 degrees, internal rotation from 5 to 10 degrees and external rotation to 20 degrees.    The lower extremity is neurovascularly intact per the patient's baseline sensation and strength.    Radiographs:   X-rays obtained today, and reviewed personally, are an AP pelvis, AP and lateral of the right hip and demonstrate well-fixed acetabular and femoral components in good alignment.     IMPRESSION:  Carly Higgins is a 63 y.o. female following up approximately 4 weeks status post right total hip arthroplasty.     PLAN:  The patient is doing well overall.  The importance of fall avoidance was reinforced. Antibiotic prophylaxis guidelines and prohibition for 3 months postoperatively for dental and surgical procedures was re-iterated.  We reviewed activity and range-of-motion precautions, icing and elevating, as well as the use of anti-inflammatories if patient is safely able to take them.  Instructions were given regarding continuation of pain meds and anticoagulation, as well cessation of those medications.        Next follow up: in May with Dr. Arlyce Dice when she returns from [place]  Studies to order for next visit: None, unless clinically indicated upon evaluation    All additional questions were answered, the patient was encouraged to call with any future questions or concerns.    Elsworth Soho, FNP-C    Division of Adult Reconstruction  [facility]  Department of Orthopaedics and Rehabilitation     This document was dictated with Conservation officer, historic buildings. Please excuse any errors as the document was typeread to the best of my ability.

## 2023-01-21 ENCOUNTER — Encounter: Payer: Self-pay | Admitting: Gastroenterology

## 2023-01-22 ENCOUNTER — Other Ambulatory Visit: Payer: Self-pay | Admitting: Thoracic Diseases

## 2023-01-22 MED ORDER — PREGABALIN 150 MG PO CAPS *I*
150.0000 mg | ORAL_CAPSULE | Freq: Two times a day (BID) | ORAL | 0 refills | Status: DC
Start: 2023-01-22 — End: 2023-02-27

## 2023-01-26 ENCOUNTER — Other Ambulatory Visit: Payer: Self-pay | Admitting: Internal Medicine

## 2023-02-20 ENCOUNTER — Telehealth: Payer: Self-pay | Admitting: Thoracic Diseases

## 2023-02-23 ENCOUNTER — Other Ambulatory Visit: Payer: Self-pay | Admitting: Internal Medicine

## 2023-02-27 ENCOUNTER — Telehealth: Payer: Self-pay | Admitting: Neurology

## 2023-02-27 MED ORDER — PREGABALIN 100 MG PO CAPS *I*
100.0000 mg | ORAL_CAPSULE | Freq: Two times a day (BID) | ORAL | 0 refills | Status: DC
Start: 2023-02-27 — End: 2023-03-28

## 2023-02-27 NOTE — Telephone Encounter (Signed)
Neuro-Oncology    Carly Higgins reports that she has been off Lyrica 150 mg BID with an increase in her neuropathic pain.  She did not realize how much the medication was helping her.  She would like to restart, but found that 150 mg BID was too sedating.  Advised to start Lyrica 100 mg BID.  New prescription sent to pharmacy.    Instructed to call [phone] with questions or concerns.    Sherlon Handing, NP  Nurse Practitioner, Division of Neuro-Oncology

## 2023-03-21 ENCOUNTER — Telehealth: Payer: Self-pay | Admitting: Internal Medicine

## 2023-03-21 NOTE — Telephone Encounter (Addendum)
 Here are Carly Higgins's blood pressures from today:    Right  arm  80/66      Left arm  - 87/68      I have called [name] and told her to not take any blood pressure medications per your instructions.      [name] is in [place] and has been sick with a cold. She is covid neg. She went to urgent care, her BP was 94/60, she had a bad cough and felt light headed. She was given methnathy lpred olone ,  which she said is a prednisone and not sure of the spelling, but has not started this yet because she does feel much better. She did take doxycycline, benzonate and a cough syruo.  She is not as light headed and thinks her BP has gone up but wonders if she should tale her BP meds or not.  She can be reached at [phone].

## 2023-03-21 NOTE — Telephone Encounter (Signed)
 Tried calling her   VM - left message   Hold Metoprolol for now while BP low   May need to restart  Hydrate   Fluids - would add electrolyte powder such as Liquid IV or similar   [facility]

## 2023-03-21 NOTE — Telephone Encounter (Signed)
 Seen and agree  Lars Pinks, MD

## 2023-03-22 ENCOUNTER — Other Ambulatory Visit: Payer: Self-pay | Admitting: Internal Medicine

## 2023-03-22 NOTE — Telephone Encounter (Signed)
 OK. Done

## 2023-03-28 ENCOUNTER — Other Ambulatory Visit: Payer: Self-pay | Admitting: Thoracic Diseases

## 2023-04-01 ENCOUNTER — Telehealth: Payer: Self-pay | Admitting: Internal Medicine

## 2023-04-01 NOTE — Telephone Encounter (Signed)
 She called me back again.  New reads for this am are much lower than the previous days I gave you.  They are 74/60 and 73/59.  Now she doesn't know if she should be on  meds or not?

## 2023-04-01 NOTE — Telephone Encounter (Signed)
 She stopped metoprolol because of low reads during her bronchitis illness.  She is better.  Reads recently are 108/65, 111/81 and 131/112.  Wants to restart metoprolol OK?

## 2023-05-04 ENCOUNTER — Encounter: Payer: Self-pay | Admitting: Internal Medicine

## 2023-05-19 NOTE — Patient Instructions (Signed)
 Continue surveillance     Annual bilateral mammogram due September 2025    Schedule Mediport flush (due now). Should be done every 2-3 months.      Consider removing Mediport since no further treatment planned at this time.     Follow up here in 6 months; prior to heading to [place]  for the winter.

## 2023-05-19 NOTE — Progress Notes (Unsigned)
 Medical Oncology   [facility]     FOLLOW-UP VISIT    PATIENT NAME:  Carly Higgins, Carly Higgins    DOB:  1960/09/06   MRN:  [id number]  05/20/2023     DIAGNOSIS:  Clinical stage IIB left breast cancer    REASON FOR VISIT: [name] is seen today for follow up and ongoing recommendations for management of stage IIB (cT2, cN0, cM0) grade 3, ER negative, PR negative, HER2 negative infiltrating ductal carcinoma of the left breast.   She is now on surveillance.     She underwent left partial mastectomy and SLNB on January 24, 2021.  Just prior she received doxorubicin  and cyclophosphamide  (cycle 4 on December 08, 2020) following 4 cycles of weekly paclitaxel  and carboplatin  as neoadjuvant chemotherapy.      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    In February 2022 she began to experience discomfort in the left breast.  She first attributed this to having been struck with a part of her dog when she picked it up about 1 week earlier.  She continued to experience discomfort intermittently. She scheduled a screening mammogram, having not had an exam since July 2018.         IDC left 2 o'clock 2.5 cm, ER/PR- her 2-   05/18/2020 Significant Radiology Findings    Screening [place]: scattered tissue, architectural distortion left 2 o'clock. BIRAD 0       06/07/2020 Significant Radiology Findings    Ultrasound [place]: irregular hypoechoic mass left 2 o'clock, 19x19x12 mm, 25 mm radial. middle third upper outer quadrant of the left breast. BIRAD 4  Ultrasound guided core IDC     06/09/2020 Procedure    ultrasound-guided core biopsy of the left breast 2:00 lesion:   Biopsy revealed invasive ductal carcinoma.  Provisional combined Nottingham grade was 3 of 3 (tubule formation score 3, nuclear grade 3, mitotic rate score 2).  Tumor cells were negative for estrogen receptors and progesterone  receptors.  Tumor cells were negative for HER2 by immunohistochemical staining with a staining score of 1+.  Ki-67 staining was seen in approximately 70% of tumor cells.        06/13/2020 Consultation    She was seen in the Hereditary Oncology clinic :  Because of her prior allogeneic bone marrow transplant, she was not eligible for standard germline testing with blood or saliva specimen.       06/15/2020 Initial Diagnosis    IDC left 2 o'clock 2.5 cm, ER/PR- her 2-     06/15/2020 Cancer Staged    Staging form: Breast, AJCC 8th Edition  - Clinical stage from 06/15/2020: Stage IIB (cT2, cN0, cM0, G3, ER-, PR-, HER2-)     06/15/2020 Consultation    Surgical consultation:  Dr. Dovie Gell      06/20/2020 Significant Radiology Findings    Whole-body bone scan : No abnormal focus of radiotracer uptake to suggest osseous metastatic disease.     06/22/2020 Significant Radiology Findings    CT of the chest: mill-defined enhancing lesion in the lateral left breast measuring 1.7 x 1.2 x 1.1 cm.  There were asymmetrically prominent level 1 left axillary lymph nodes, enhancement of uncertain significance, possibly representing metastatic disease. No enlarged mediastinal, hilar, intramammary,  supraclavicular lymph nodes or suspicious pulmonary nodules were identified.       06/22/2020 Significant Radiology Findings  CT of the abdomen pelvis : no evidence of metastatic disease . small fat filled umbilical hernia noted.     06/28/2020 Significant Radiology Findings    MRI of the breast: irregular mass with spiculated margins measuring 10 x 25 x 19 mm in the left breast upper outer quadrant at the 2 o'clock position located 3 cm from the nipple.      07/15/2020 - 12/08/2020 Chemotherapy    Began cycle 1 of weekly paclitaxel  and carboplatin  on July 15, 2020.  Immunotherapy with pembrolizumab was not recommended due to her previous allogenic bone marrow transplant.  Final cycle (4) of paclitaxel  and carboplatin  was administered on September 30, 2020.  Began dose dense cyclophosphamide  and doxorubicin  on October 06, 2020    OP Breast CARBOplatin  Weekly Paclitaxel  then [facility]   Plan Provider: Milton Alpers, MD  Treatment goal: Neoadjuvant  Line of treatment: [No plan line of treatment]     08/18/2020 Procedure    PowerPort in the right internal jugular vein.     12/21/2020 Significant Radiology Findings    Left breast mammogram and ultrasound on December 21, 2020:  Impression:  Mass in the left breast upper outer quadrant at 2 o'clock located 8 centimeters from the nipple is a known malignancy. Surgical consultation is recommended. Patient completed chemotherapy treatment on 12/08/2020. Patient is scheduled for left lumpectomy on 01/24/2021.     Finding 2:  Normal lymph node and biopsy clip in the left axilla is benign.     Finding 3:  Stable calcifications in the left breast are benign.        01/04/2021 Significant Radiology Findings    MRI breasts on December 15, 2020:  Previously seen irregular spiculated enhancing mass in the left breast does not demonstrate significant residual enhancement. A biopsy clip is noted along the medial margin of the previously seen mass.  There is no axillary or internal mammary lymphadenopathy. In the right breast, there is no evidence of any mass or suspicious enhancements.  Impression:  Area in the left breast is a known malignancy. No significant residual enhancement in the area of previously seen mass, consistent with excellent/complete radiologic response.        01/24/2021 Surgery    Wire localized partial mastectomy and sentinel lymph node biopsy- Paulino Bosch    Complete pathologic response.      02/07/2021 Cancer Staged    Staging form: Breast, AJCC 8th Edition  - Pathologic stage from 02/07/2021: No Stage Recommended (ypT0, pN0, cM0, GX)         INTERVAL HISTORY:  Carly Higgins was last seen in our office in November 2024.   Since that time:    She underwent right THA on December 21, 2022 ***    She previously underwent  left hip arthroplasty on October 20, 2021.      She spent the winter in [place] ; returning to [place] 2 weeks ago.     Implanted central venous access port is still in place.  Will consider removal in the fall.    She had follow up with surgical oncology just prior to this visit today.  At that visit an ECG was performed after irregular rhythm noted on exam.  Felt to be atrial fibrillation.  Patient is stable.  PCP notified.    At this time she denies palpitations.  She does experience windedness randomly for several seconds after which she feels fine.  She reports compliance with her metoprolol  and ASA 81 mg.  She most recently saw her PCP on May 31 when she discussed her concern for profuse sweating.  Thyroid  levels checked.  Trial of Drysol recommended.  Encouraged to follow-up with hematology.  Additionally she requested to wean off Cymbalta  (neuropathy, depression anxiety) and resume Lexapro .    Most recent mammogram was in September 2024; no concerns.  She has no breast related concerns at this time.      Her appetite is good.  She reports significant weight gain and increase in hemoglobin A1c after stopping Mounjaro .  Mounjaro  was resumed on June 1.   She reports ongoing daily morning nausea for which ondansetron  has been helpful.  She has experienced this since her BMT.  She has not vomited.  She denies diarrhea or constipation.  Her energy level has been pretty good.  She has not had any recent fevers or symptoms of infection.  She denies respiratory symptoms.  She is not experiencing any urinary symptoms.       Prior to beginning chemotherapy she had baseline numbness in her first toe bilaterally.  Neuropathy symptoms worsened in her feet with paclitaxel .  Today she reports that she is still experiencing prickly discomfort and pain in her toes.  She had tried gabapentin  and stopped this after she did not find it to be helpful.  She tried acupuncture X 3 did not feel it was helpful.  Had a foot massage  once which was calming but did not help with neuropathy.  She would be interested in any recommendations or participation in any studies for peripheral neuropathy.    ROS: As outlined above: comprehensive review of systems is otherwise negative.     Immunization History  She reports that she received annual flu vaccine on October 01, 2022     Administered Date(s) Administered    COVID-19 MRNA Bivalent Vaccine Booster Pfizer-Biontech 30 mcg/0.3mL IM SUSP 10/25/2020    Covid-19 mRNA vaccine (PFIZER) IM 30 mcg/0.44mL 05/15/2019, 06/05/2019, 10/08/2019    DTaP(Infanrix ) 08/01/2016, 09/25/2016, 11/22/2016    Hepatitis B Adult 08/01/2016, 09/25/2016, 02/21/2017, 08/15/2017    HiB(Acthib/Hiberix ) 08/01/2016, 09/25/2016, 11/22/2016    IPV 08/01/2016, 09/25/2016, 11/22/2016    Meningococcal Conjugate MCV4O (Menveo) 08/01/2016    Pneumococcal 13-valent Conjugate (Prevnar 13 ) 08/01/2016, 09/25/2016, 11/22/2016    Pneumococcal Polysaccharide (Pneumovax) 02/21/2017, 08/15/2017    Zoster(Shingrix ) 02/21/2017, 08/15/2017       PAST MEDICAL/SURGICAL HISTORY:   Status 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  Prior to bone marrow transplant she received 5 cycles of decitabine .  She received fludarabine , busulfan  and and hydroxyurea  for the transplant and received methotrexate  for graft-versus-host prophylaxis.  Hypothyroid.  Anxiety.  Osteoarthritis of the hips, left greater than right.  Mild diverticulosis of the sigmoid colon.  History of atrial fibrillation and congestive heart failure in 2017 related to the bone marrow transplant.  Right cataract surgery.  Right rotator cuff surgery.    MEDICATIONS:     Current Outpatient Medications   Medication Sig    escitalopram  (LEXAPRO ) 10 mg tablet TAKE 1 TABLET(10 MG) BY MOUTH DAILY    MOUNJARO  5 MG/0.5ML pen INJECT 5 MG UNDER THE SKIN ONE DAY A WEEK    pregabalin   (LYRICA ) 100 mg capsule TAKE 1 CAPSULE(100 MG) BY MOUTH TWICE DAILY FOR NERVE PAIN. MAX DAILY DOSE: 200 MG    ondansetron  (ZOFRAN -ODT) 4 MG disintegrating tablet DISSOLVE 1 TABLET ON TOP OF THE TONGUE THREE TIMES DAILY  AS NEEDED    nystatin -triamcinolone  (MYCOLOG II) cream Apply topically 2 times daily as needed for Skin Infection due to Candida Yeast. Apply to affected area. Do not apply on or near surgical site.    acetaminophen  (TYLENOL ) 500 mg tablet Take 2 tablets (1,000 mg total) by mouth 3 times daily for Pain. For post-operative pain control. Do not exceed more than 3,000mg  daily from all combined sources.    apixaban  (ELIQUIS ) 5 mg tablet Take 1 tablet (5 mg total) by mouth every 12 hours. Please complete Eliquis  2.5mg  tablet by mouth twice daily for 5 days PRIOR to restarting your home dose/frequency of Eliquis  5mg  by mouth twice daily    Lidocaine -Menthol  (NERVIVE ROLL-ON EX) Apply 1 g topically as needed. For toe pain    metoprolol  succinate ER (TOPROL -XL) 200 mg 24 hr tablet Take 1 tablet (200 mg total) by mouth daily. Do not crush or chew. May be divided.    acyclovir  (ZOVIRAX ) 400 mg tablet TAKE 1 TABLET(400 MG) BY MOUTH TWICE DAILY    levothyroxine  (SYNTHROID , LEVOTHROID) 50 mcg tablet TAKE 1 TABLET BY MOUTH EVERY DAY    cyanocobalamin  (VITAMIN B-12) 500 mcg tablet Take 1 tablet (500 mcg total) by mouth daily for Inadequate Vitamin B12, peripheral neuropathy.    atorvastatin  (LIPITOR) 40 mg tablet TAKE 1 TABLET BY MOUTH IN THE EVENING    Menthol , Topical Analgesic, (ICY HOT EX) Apply 1 applicator topically as needed (pain).    Menthol , Topical Analgesic, (BIOFREEZE EX) Apply 1 applicator topically as needed (pain).     OBJECTIVE FINDINGS:    Vitals:    06/11/22 1000   BP: 126/76   Pulse: (!) 128 (see interval history)   Temp: 35.5 C (95.9 F)   TempSrc: Temporal   SpO2: 99%; room air   Weight: 121.5 kg (267 lb 13.7 oz); + 13.0 kg since visit June 2023 (see interval history)     There were no vitals  filed for this visit.      PHYSICAL EXAMINATION:     General: Reveals a well-appearing woman; pleasant and engaged. ECOG performance status is 1-2 (due to hip pain).    HEENT : grossly unremarkable; notable for regrowth of scalp hair.  Sclerae are anicteric. Conjunctivae are pink.    Lymph nodes: There is no palpable cervical, supraclavicular, axillary or inguinal lymph node enlargement.   Chest: clear to auscultation and percussion.  PowerPort is present in the upper chest on the right; site is benign.    Cardiovacular: regularly irregular.  No murmur    Breasts: deferred; had CBE with surgical oncology Grace Laura, [place])  just prior to this visit today.   Abdomen: is soft,non tender  Bowel sounds are +  All quadrants.no hepatosplenomegaly, mass or ascites detected .   Extremities: are without edema. There is not evidence of lymphedema.   Neurologic exam: is grossly nonfocal.   Skin: is warm and dry without obvious rash, petechiae or ecchymoses. patches of vitiligo in the upper abdomen.    LABORATORY DATA:   No blood work ordered for this visit    IMAGING DATA:   Bilateral mammogram on September 17, 2022:  There are scattered areas of fibroglandular densities.  There are stable round calcifications seen in the lateral right breast.  There is a post-lumpectomy scar seen in the anterior upper outer quadrant of the left breast.  Post lumpectomy surgical clips are present at the site. There is also a surgical clip in the left axilla. An asymmetry  seen just medial to the scar is stable. No new or suspicious masses, calcifications, asymmetries or architectural distortion is seen.  Impression There is no mammographic evidence of malignancy. Routine screening mammogram in one year recommended.    ASSESSMENT:     ***    Ellora is now 3 years post diagnosis of stage IIB triple negative left breast cancer and more than 2 years s/p left seed loc partial mastectomy with SLNBx; she had PCR after chemo therapy.  Based on review  of systems and physical exam, she is without obvious evidence to suggest recurrence at this time.     She had follow up with surgical oncology just prior to this visit today.  At that visit an ECG was performed after irregular rhythm noted on exam.  Felt to be atrial fibrillation. PCP notified; she will be seeing him later today.  At this time she denies palpitations.  She does report windedness randomly for several seconds after which she feels fine.  She reports compliance with her metoprolol  and ASA 81 mg.  She is feeling well today.      She still has implanted central venous access port in place.  I will remind her that this should be flushed minimally every 8-12 weeks.  She would like to wait until the fall to have it removed.    PLAN:     Clinical stage IIB triple negative left breast cancer    Continue surveillance     Annual bilateral mammogram due September 2025    Schedule Mediport flush (due now). Should be done every 2-3 months.      Consider removing Mediport since no further treatment planned at this time.     Follow up here in 6 months; prior to heading to [place]  for the winter.    Peripheral neuropathy    Did not receive benefit from acupuncture X 3    She stopped Gabepentin (as prescribed by PCP); did not find effective    Will notify her when new chemotherapy induced peripheral neuropathy study opens (as not eligible for first one based on BMI).    Will check with integrative oncology program regarding group sessions for peripheral neuropathy.    COVID-19     Patient has received the Pfizer COVID-19 vaccine, receiving the first dose on May 15, 2019, second dose on Jun 05, 2019  booster dose on October 08, 2019 and the COVID-19 MRNA Bivalent Vaccine Booster Pfizer-Biontech on  October 25, 2020.    Patient advised to continue to take precautions to reduce risk of contracting COVID-19, including social distancing, frequent handwashing and using a mask when in public.      I personally spent 45  minutes minutes on the calender day of the encounter, including pre and post visit work.    ***          xc:       Enoch Harter, MD              Paulino Bosch, MD              Retta Caster, MD

## 2023-05-20 ENCOUNTER — Ambulatory Visit: Payer: Medicare Other | Attending: Oncology | Admitting: Oncology

## 2023-05-20 ENCOUNTER — Other Ambulatory Visit
Admission: RE | Admit: 2023-05-20 | Discharge: 2023-05-20 | Disposition: A | Discharge status: Home / Self Care | Source: Ambulatory Visit | Attending: Oncology | Admitting: Oncology

## 2023-05-20 VITALS — BP 120/68 | HR 78 | Temp 95.9°F | Wt 259.0 lb

## 2023-05-20 DIAGNOSIS — R7989 Other specified abnormal findings of blood chemistry: Secondary | ICD-10-CM | POA: Insufficient documentation

## 2023-05-20 DIAGNOSIS — C50412 Malignant neoplasm of upper-outer quadrant of left female breast: Secondary | ICD-10-CM | POA: Insufficient documentation

## 2023-05-20 DIAGNOSIS — E1140 Type 2 diabetes mellitus with diabetic neuropathy, unspecified: Secondary | ICD-10-CM | POA: Insufficient documentation

## 2023-05-20 DIAGNOSIS — Z171 Estrogen receptor negative status [ER-]: Secondary | ICD-10-CM | POA: Insufficient documentation

## 2023-05-20 DIAGNOSIS — R61 Generalized hyperhidrosis: Secondary | ICD-10-CM | POA: Insufficient documentation

## 2023-05-20 DIAGNOSIS — I499 Cardiac arrhythmia, unspecified: Secondary | ICD-10-CM | POA: Insufficient documentation

## 2023-05-20 DIAGNOSIS — Z17421 Hormone receptor negative with human epidermal growth factor receptor 2 negative status: Secondary | ICD-10-CM | POA: Insufficient documentation

## 2023-05-20 DIAGNOSIS — Z9221 Personal history of antineoplastic chemotherapy: Secondary | ICD-10-CM | POA: Insufficient documentation

## 2023-05-20 DIAGNOSIS — Z923 Personal history of irradiation: Secondary | ICD-10-CM | POA: Insufficient documentation

## 2023-05-20 DIAGNOSIS — G620 Drug-induced polyneuropathy: Secondary | ICD-10-CM | POA: Insufficient documentation

## 2023-05-20 DIAGNOSIS — Z7901 Long term (current) use of anticoagulants: Secondary | ICD-10-CM | POA: Insufficient documentation

## 2023-05-20 DIAGNOSIS — C50919 Malignant neoplasm of unspecified site of unspecified female breast: Secondary | ICD-10-CM | POA: Insufficient documentation

## 2023-05-20 DIAGNOSIS — Z9481 Bone marrow transplant status: Secondary | ICD-10-CM | POA: Insufficient documentation

## 2023-05-20 DIAGNOSIS — Z09 Encounter for follow-up examination after completed treatment for conditions other than malignant neoplasm: Secondary | ICD-10-CM | POA: Insufficient documentation

## 2023-05-20 DIAGNOSIS — T451X5A Adverse effect of antineoplastic and immunosuppressive drugs, initial encounter: Secondary | ICD-10-CM | POA: Insufficient documentation

## 2023-05-20 DIAGNOSIS — Z9889 Other specified postprocedural states: Secondary | ICD-10-CM | POA: Insufficient documentation

## 2023-05-20 DIAGNOSIS — E039 Hypothyroidism, unspecified: Secondary | ICD-10-CM | POA: Insufficient documentation

## 2023-05-20 LAB — CBC AND DIFFERENTIAL
Baso # K/uL: 0 10*3/uL (ref 0.0–0.2)
Eos # K/uL: 0.2 10*3/uL (ref 0.0–0.5)
Hematocrit: 42 % (ref 34–49)
Hemoglobin: 13.1 g/dL (ref 11.2–16.0)
IMM Granulocytes #: 0 10*3/uL (ref 0.0–0.0)
IMM Granulocytes: 0.3 %
Lymph # K/uL: 2.4 10*3/uL (ref 1.0–5.0)
MCV: 91 fL (ref 75–100)
Mono # K/uL: 0.7 10*3/uL (ref 0.1–1.0)
Neut # K/uL: 3.2 10*3/uL (ref 1.5–6.5)
Nucl RBC # K/uL: 0 10*3/uL (ref 0.0–0.0)
Nucl RBC %: 0 /100{WBCs} (ref 0.0–0.2)
Platelets: 273 10*3/uL (ref 150–450)
RBC: 4.6 MIL/uL (ref 4.0–5.5)
RDW: 17.9 % — ABNORMAL HIGH (ref 0.0–15.0)
Seg Neut %: 49.9 %
WBC: 6.4 10*3/uL (ref 3.5–11.0)

## 2023-05-20 LAB — COMPREHENSIVE METABOLIC PANEL
ALT: 17 U/L (ref 0–35)
AST: 23 U/L (ref 0–35)
Albumin: 4.1 g/dL (ref 3.5–5.2)
Alk Phos: 96 U/L (ref 35–105)
Anion Gap: 12 (ref 7–16)
Bilirubin,Total: 0.6 mg/dL (ref 0.0–1.2)
CO2: 23 mmol/L (ref 20–28)
Calcium: 10.1 mg/dL (ref 8.6–10.2)
Chloride: 104 mmol/L (ref 96–108)
Creatinine: 0.85 mg/dL (ref 0.51–0.95)
Glucose: 117 mg/dL — ABNORMAL HIGH (ref 60–99)
Lab: 26 mg/dL — ABNORMAL HIGH (ref 6–20)
Potassium: 5.5 mmol/L — ABNORMAL HIGH (ref 3.3–5.1)
Sodium: 139 mmol/L (ref 133–145)
Total Protein: 6.8 g/dL (ref 6.3–7.7)
eGFR BY CREAT: 77 *

## 2023-05-20 LAB — NEUTROPHIL #-INSTRUMENT: Neutrophil #-Instrument: 3.2 10*3/uL

## 2023-05-20 LAB — TSH: TSH: 2.62 u[IU]/mL (ref 0.27–4.20)

## 2023-05-20 LAB — T3: T3: 123 ng/dL (ref 80–200)

## 2023-05-20 LAB — MULTIPLE ORDERING DOCS

## 2023-05-20 LAB — HEMOGLOBIN A1C: Hemoglobin A1C: 5.8 % — ABNORMAL HIGH

## 2023-05-20 LAB — T4, FREE: Free T4: 1.2 ng/dL (ref 0.9–1.7)

## 2023-05-20 LAB — BILIRUBIN, DIRECT: Bilirubin,Direct: <0.2 mg/dL (ref 0.0–0.3)

## 2023-05-21 ENCOUNTER — Telehealth: Payer: Self-pay | Admitting: Internal Medicine

## 2023-05-21 ENCOUNTER — Other Ambulatory Visit: Payer: Self-pay

## 2023-05-21 MED ORDER — ACYCLOVIR 400 MG PO TABS *I*: 400.0000 mg | ORAL_TABLET | Freq: Two times a day (BID) | ORAL | 3 refills | Status: DC

## 2023-05-21 NOTE — Telephone Encounter (Signed)
 LMOM

## 2023-05-21 NOTE — Telephone Encounter (Signed)
 The hematology provider sent me a secure message about Carly Higgins visit yesterday.  She apparently was in A-fib with a normal blood pressure and heart rate.  She wanted know what to do.  By the time I got back to her she had left that office.  We should follow-up with Carly Higgins see how she is feeling.  See if she has cardiology follow-up.  See if she would like an office with us  to discuss her atrial fibrillation.  [facility]

## 2023-05-22 NOTE — Telephone Encounter (Signed)
LMOM for pt to call back./kk

## 2023-05-24 ENCOUNTER — Other Ambulatory Visit: Payer: Self-pay | Admitting: Internal Medicine

## 2023-05-27 ENCOUNTER — Other Ambulatory Visit: Payer: Self-pay | Admitting: Internal Medicine

## 2023-05-30 ENCOUNTER — Ambulatory Visit: Payer: Medicare Other | Attending: Orthopedic Surgery

## 2023-05-30 ENCOUNTER — Encounter: Payer: Self-pay | Admitting: Orthopedic Surgery

## 2023-05-30 ENCOUNTER — Other Ambulatory Visit: Payer: Self-pay

## 2023-05-30 VITALS — Ht 66.0 in | Wt 261.0 lb

## 2023-05-30 DIAGNOSIS — M1611 Unilateral primary osteoarthritis, right hip: Secondary | ICD-10-CM

## 2023-05-30 NOTE — Progress Notes (Signed)
 Total Hip Replacement, Follow-Up Visit    HPI: Carly Higgins returns to the office approximately 5 months after a right total hip replacement on 12/21/2022 with Dr. Arvie Latus, and reports that they are doing relatively well, and overall is pleased.    History of Present Illness  The patient is a 63 year old female who comes in today approximately 5 months status post right total hip arthroplasty with Dr. Arvie Latus.    She reports satisfactory recovery from her hip surgery, with occasional pain that she attributes to weather changes. Pain management has been achieved with intermittent use of ibuprofen . No new falls or injuries have occurred, and she reports no recent fevers, chills, or infections. The surgical incision appears to be well healed, although mild discomfort upon palpation and occasional numbness are present. She continues to perform home exercises prescribed post-surgery and has discontinued the use of a scooter, which she had been using for several years.    She also reports neuropathy in her foot, which she believes is a consequence of her cancer treatment. She is currently on Lyrica  for this condition and plans to consult her physician for further evaluation. She expresses a desire to resume walking and questions whether this would alleviate the weakness in her legs.    She had acute bronchitis while in [place]  and was sick for 3 weeks. She received antibiotics from urgent care, completed the course, and her symptoms have resolved.       Current medications were updated during this office encounter, and were personally reviewed.  Above histories were obtained from external medical records and documents that were each personally reviewed.  An independent historian was not required to complete above history taking.       PHYSICAL EXAMINATION:    Physical examination of the right hip reveals an incision that is well healed.    The patient walks with a reasonable gait and minimal antalgia.    The hip has  painless range of motion with flexion up to 90 degrees, internal rotation from 5 to 10 degrees and external rotation to 20 degrees.   The lower extremity is neurovascularly intact per the patient's baseline sensation and strength.      STUDY REVIEW AND INTERPRETATION:     Radiographs: No new images obtained.      IMPRESSION:  Carly Higgins is a 63 y.o. female following up approximately 5 months status post right total hip arthroplasty.     Assessment/Plan:  The patient is doing well overall.  The patient was reassured that minor aches and pains are typical for hip replacements during the first year, and encouragement was provided for ongoing strengthening and normal use of the hip.  The patient was reassured intermittent pain and stiffness, as well as any other experienced issues, could continue to be present for a number of months as part of the normal healing process.  It was reiterated that ongoing clinical improvement can continue for up to one year or beyond after hip replacement.    Assessment & Plan  1. Post-operative status following right total hip arthroplasty.  - Recovery trajectory is satisfactory, with no significant functional deficits or new injuries reported.  - Advised to continue current regimen of exercises and walking, incorporating them into daily routine.  - Counseled to avoid falls and to report any acute changes or worsening symptoms promptly.    2. Neuropathy.  - Baseline neuropathy in the foot is reported.  - Managed with Lyrica , which dulls the pain slightly  but does not completely alleviate symptoms.  - Plans to follow up with specialist for further evaluation and potential studies.  - Discussed the importance of monitoring for injuries due to reduced sensation in the feet.    Follow-up  The patient will follow up in 7 months for 1 year post-op visit w/ Dr. Arvie Latus.     Unique studies/tests ordered today: None  Medical or Drug therapy ordered today: None    Next follow up: 7  months  Studies to order for next visit: 2 views right hip including AP pelvis    All additional questions were answered, the patient was encouraged to call with any future questions or concerns.    Sunnie England, FNP-C    Division of Adult Reconstruction  [facility]  Department of Orthopaedics and Rehabilitation       The patient was informed that the above conversation was recorded and summarized by the [employer] ambient AI scribe software.

## 2023-05-31 ENCOUNTER — Ambulatory Visit: Payer: Self-pay | Admitting: Internal Medicine

## 2023-05-31 ENCOUNTER — Encounter: Payer: Self-pay | Admitting: Internal Medicine

## 2023-05-31 VITALS — BP 124/72 | HR 53 | Resp 16 | Ht 66.0 in | Wt 262.0 lb

## 2023-05-31 DIAGNOSIS — E039 Hypothyroidism, unspecified: Secondary | ICD-10-CM

## 2023-05-31 DIAGNOSIS — M150 Primary generalized (osteo)arthritis: Secondary | ICD-10-CM

## 2023-05-31 DIAGNOSIS — T451X5A Adverse effect of antineoplastic and immunosuppressive drugs, initial encounter: Secondary | ICD-10-CM

## 2023-05-31 DIAGNOSIS — I480 Paroxysmal atrial fibrillation: Secondary | ICD-10-CM

## 2023-05-31 DIAGNOSIS — Z23 Encounter for immunization: Secondary | ICD-10-CM

## 2023-05-31 DIAGNOSIS — F605 Obsessive-compulsive personality disorder: Secondary | ICD-10-CM

## 2023-05-31 DIAGNOSIS — E669 Obesity, unspecified: Secondary | ICD-10-CM

## 2023-05-31 DIAGNOSIS — I472 Ventricular tachycardia, unspecified: Secondary | ICD-10-CM

## 2023-05-31 DIAGNOSIS — E1140 Type 2 diabetes mellitus with diabetic neuropathy, unspecified: Secondary | ICD-10-CM

## 2023-05-31 DIAGNOSIS — D471 Chronic myeloproliferative disease: Secondary | ICD-10-CM

## 2023-05-31 DIAGNOSIS — F419 Anxiety disorder, unspecified: Secondary | ICD-10-CM

## 2023-05-31 DIAGNOSIS — F331 Major depressive disorder, recurrent, moderate: Secondary | ICD-10-CM

## 2023-05-31 DIAGNOSIS — F4323 Adjustment disorder with mixed anxiety and depressed mood: Secondary | ICD-10-CM

## 2023-05-31 DIAGNOSIS — K219 Gastro-esophageal reflux disease without esophagitis: Secondary | ICD-10-CM

## 2023-05-31 MED ORDER — TIRZEPATIDE 7.5 MG/0.5ML SC SOAJ *I*
7.5000 mg | SUBCUTANEOUS | 2 refills | Status: DC
Start: 2023-05-31 — End: 2023-08-31

## 2023-05-31 MED ORDER — METOPROLOL SUCCINATE 25 MG PO TB24 *I*
25.0000 mg | ORAL_TABLET | Freq: Every day | ORAL | 2 refills | Status: DC
Start: 2023-05-31 — End: 2023-06-12

## 2023-05-31 MED ORDER — METOPROLOL SUCCINATE 25 MG PO TB24 *I*
12.5000 mg | ORAL_TABLET | Freq: Every day | ORAL | Status: DC
Start: 2023-05-31 — End: 2023-05-31

## 2023-05-31 NOTE — Progress Notes (Signed)
 [facility]  [address]  [place], [place] [address]                  SUBJECTIVE:       Reason For Visit:   Chief Complaint   Patient presents with    Follow-up     Pt is here for a 6 month follow up.  No new concerns per pt./kk      A fib at the hematologist - she was feeling fine - she had no issues then - no concerns   She was have some sweating   She was feeling fine     She sweats a lot   Neuropathy off and on - issues   Going to podiatrist soon     Losing weight  Is a little frustrated this  We discussed the possibility of increasing Mounjaro  which was very excited about    Review of Systems   Constitutional:  Negative for chills, diaphoresis, fever and malaise/fatigue.   Eyes: Negative.    Respiratory: Negative.  Negative for shortness of breath.    Cardiovascular: Negative.  Negative for chest pain, palpitations and leg swelling.   Gastrointestinal: Negative.  Negative for abdominal pain, constipation, diarrhea, nausea and vomiting.   Genitourinary: Negative.    Skin: Negative.    Neurological:  Negative for headaches.       Current Outpatient Medications on File Prior to Visit   Medication Sig Dispense Refill    multiple vitamins-minerals Take by mouth.      MOUNJARO  5 MG/0.5ML pen ADMINISTER 5 MG UNDER THE SKIN 1 TIME A WEEK 2 mL 6    apixaban  (ELIQUIS ) 5 mg tablet TAKE 1 TABLET(5 MG) BY MOUTH EVERY 12 HOURS 180 tablet 3    acyclovir  (ZOVIRAX ) 400 mg tablet Take 1 tablet (400 mg total) by mouth 2 times daily for Infection caused by the Varicella Zoster Virus. 180 tablet 3    pregabalin  (LYRICA ) 100 mg capsule TAKE 1 CAPSULE(100 MG) BY MOUTH TWICE DAILY FOR NERVE PAIN. MAX DAILY DOSE: 200 MG 60 capsule 2    ondansetron  (ZOFRAN -ODT) 4 MG disintegrating tablet DISSOLVE 1 TABLET ON TOP OF THE TONGUE THREE TIMES DAILY AS NEEDED 30 tablet 0    escitalopram  (LEXAPRO ) 10 mg tablet TAKE 1 TABLET(10 MG) BY MOUTH DAILY 90 tablet 1    nystatin -triamcinolone  (MYCOLOG II) cream Apply  topically 2 times daily as needed for Skin Infection due to Candida Yeast. Apply to affected area. Do not apply on or near surgical site.      Lidocaine -Menthol  (NERVIVE ROLL-ON EX) Apply 1 g topically as needed. For toe pain      levothyroxine  (SYNTHROID , LEVOTHROID) 50 mcg tablet TAKE 1 TABLET BY MOUTH EVERY DAY 90 tablet 5    atorvastatin  (LIPITOR) 40 mg tablet TAKE 1 TABLET BY MOUTH IN THE EVENING 90 tablet 5    Menthol , Topical Analgesic, (ICY HOT EX) Apply 1 applicator topically as needed (pain).      Menthol , Topical Analgesic, (BIOFREEZE EX) Apply 1 applicator topically as needed (pain).      albuterol  HFA (PROVENTIL , VENTOLIN ) 108 (90 Base) MCG/ACT inhaler Inhale 2 puffs into the lungs every 6 hours as needed.      cyanocobalamin  (VITAMIN B-12) 500 mcg tablet Take 1 tablet (500 mcg total) by mouth daily for Inadequate Vitamin B12, peripheral neuropathy. (Patient not taking: Reported on 05/31/2023) 100 tablet 5     No current facility-administered medications on file  prior to visit.     All history family/social/allergies/HM/PMHx/PSHx was reviewed   OBJECTIVE      Vitals:    05/31/23 0935   Pulse: 53   Weight: 118.8 kg (262 lb)   Height: 1.676 m (5\' 6" )     Body mass index is 42.29 kg/m.    Vitals:    05/31/23 0935 05/31/23 0946   BP:  124/72   BP Location:  Right arm   Patient Position:  Sitting   Pulse: 53    Resp:  16   SpO2: 99%    Weight: 118.8 kg (262 lb)    Height: 1.676 m (5\' 6" )         Physical Exam  Constitutional:       General: She is not in acute distress.     Appearance: Normal appearance. She is obese. She is not ill-appearing or toxic-appearing.   HENT:      Head: Normocephalic.   Eyes:      Extraocular Movements: Extraocular movements intact.      Conjunctiva/sclera: Conjunctivae normal.      Pupils: Pupils are equal, round, and reactive to light.   Neck:      Vascular: No carotid bruit.   Cardiovascular:      Rate and Rhythm: Normal rate and regular rhythm.      Pulses: Normal pulses.       Heart sounds: Normal heart sounds.   Pulmonary:      Effort: Pulmonary effort is normal.      Breath sounds: Normal breath sounds.   Abdominal:      General: Abdomen is flat. Bowel sounds are normal. There is no distension.      Palpations: Abdomen is soft.      Tenderness: There is no abdominal tenderness.   Musculoskeletal:      Cervical back: No tenderness.      Right lower leg: No edema.      Left lower leg: No edema.   Lymphadenopathy:      Cervical: No cervical adenopathy.   Neurological:      General: No focal deficit present.      Mental Status: She is alert.   Psychiatric:         Mood and Affect: Mood normal.         Behavior: Behavior normal.         Thought Content: Thought content normal.         Judgment: Judgment normal.             A&P:       Type 2 diabetes mellitus with diabetic neuropathy, without long-term current use of insulin  (Primary)  Will increase Mounjaro  to 7.5 mg  Continue diet and exercise  Continue lifestyle modification    Hypothyroidism, unspecified type  Continue levothyroxine   Continue TSH monitoring    Gastroesophageal reflux disease without esophagitis  Resolved  Off medication  Doing well    Paroxysmal atrial fibrillation  Ventricular tachycardia  Restarted metoprolol  succinate 25 mg once a day-low-dose because of her low blood pressure  Will continue monitor heart rate and her symptoms of sweating    Anxiety  Adjustment disorder with mixed anxiety and depressed mood  Moderate recurrent major depression  Obsessive compulsive personality disorder  Continue current medications  No changes    Myeloproliferative disease  Saw hematology last week  Stable    Neuropathy due to chemotherapeutic drug  On Lyrica   Need to consider stopping Lyrica  secondary  to sweating and her uncomfortable feeling of the sweating  Will discuss with Pharm.D.    Obesity  Increase Mounjaro  to 7.5 mg  Continue current lifestyle modification  Diet and exercise    Primary osteoarthritis involving multiple  joints  Symptomatic control         Alejah Aristizabal G CAHN-HIDALGO, MD5/23/20259:40 AM

## 2023-06-11 ENCOUNTER — Other Ambulatory Visit: Payer: Self-pay | Admitting: Internal Medicine

## 2023-06-12 ENCOUNTER — Encounter: Payer: Self-pay | Admitting: Cardiology

## 2023-06-12 ENCOUNTER — Ambulatory Visit: Payer: Medicare Other | Attending: Cardiology | Admitting: Cardiology

## 2023-06-12 ENCOUNTER — Telehealth: Payer: Self-pay

## 2023-06-12 ENCOUNTER — Other Ambulatory Visit: Payer: Self-pay

## 2023-06-12 VITALS — BP 116/68 | HR 74 | Ht 66.0 in | Wt 256.0 lb

## 2023-06-12 DIAGNOSIS — R9431 Abnormal electrocardiogram [ECG] [EKG]: Secondary | ICD-10-CM

## 2023-06-12 DIAGNOSIS — I472 Ventricular tachycardia, unspecified: Secondary | ICD-10-CM | POA: Insufficient documentation

## 2023-06-12 DIAGNOSIS — I517 Cardiomegaly: Secondary | ICD-10-CM

## 2023-06-12 DIAGNOSIS — I4891 Unspecified atrial fibrillation: Secondary | ICD-10-CM | POA: Insufficient documentation

## 2023-06-12 MED ORDER — METOPROLOL SUCCINATE 50 MG PO TB24 *I*
50.0000 mg | ORAL_TABLET | Freq: Every day | ORAL | 3 refills | Status: DC
Start: 2023-06-12 — End: 2023-08-09

## 2023-06-12 NOTE — Telephone Encounter (Signed)
 Patient accepted date of 8/25.     All set. Case booked.

## 2023-06-12 NOTE — Progress Notes (Signed)
 Patel, Mart Skill, MD  Tinoglio, [name]; P Amb Card Ep Rn; P Amb Card Ep Support Staff  Procedure: PFA A-fib ablation    Estimated procedure time: 2 hours    Diagnosis: Atrial fibrillation    Date (if known): Mid-to-late August    Referring provider: Lydia Sams    3D mapping/ablation/transseptal: [facility] ablation with [employer] vendor: as above    Anticoag hold: Hold Eliquis  day of procedure    Antiarrhythmics/medications hold: None    Anesthesia type: General    Labs: CBC/BMP    Special notes/instructions: None

## 2023-06-12 NOTE — Telephone Encounter (Signed)
 LM to schedule procedure. Looking to offer 8/25.          ----- Message -----  From: Marylynn Soho, MD  Sent: 06/12/2023   8:33 AM EDT  To: [name] Tinoglio; Amb Card Ep Support Staff;*    Procedure: PFA A-fib ablation    Estimated procedure time: 2 hours    Diagnosis: Atrial fibrillation    Date (if known): Mid-to-late August    Referring provider: Lydia Sams    3D mapping/ablation/transseptal: [facility] Scientific ablation with [employer] vendor: as above    Anticoag hold: Hold Eliquis  day of procedure    Antiarrhythmics/medications hold: None    Anesthesia type: General    Labs: CBC/BMP    Special notes/instructions: None

## 2023-06-12 NOTE — Progress Notes (Signed)
 [facility]        Electrophysiology office Revisit Note    Date of Visit: 06/12/2023 Patient: Carly Higgins   Patients PCP: Asberry Lav, MD Patient DOB: Jan 04, 1961  EMRN: [id number]     Subjective/Reason For Visit     I had the pleasure of seeing Carly Higgins in followup on 06/12/2023.  She is a very pleasant 63 year old female with atrial fibrillation.  Last year Holter monitor she was paroxysmal then progressed to persistent. She was cardioverted in September 2024 but had early return of atrial fibrillation.  She was discharged on apixaban  (Eliquis ) 5 mg BID, Amiodarone  200 mg twice daily and Metoprolol  Succinate Toprol  XL 150 mg daily.   We were planning A-fib ablation, but wants to have her hip surgery in Dec, for which she would need to hold eliquis , which is too early post ablation, so she elected to postpone AF ablation.  We ended up stopping amiodarone  and increasing Toprol -XL to 200 mg a day.  She did have her hip replacement surgery done and went to [place]  for the winter.  Unfortunately she had bronchitis and possibly COVID.  She had significant hypotension metoprolol  was cut back.  She recently started 25 mg.  She continues with palpitations as well as shortness of breath on exertion.    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    Conditioned w/ Busulfan  130 mg/m2/d and Fludarabine  40 mg/m2/d x4 days followed by 10/10 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+, CMV N/N)    Pulmonary edema     s/p L THA 10/20/21 10/20/2021    Shortness of breath     Tobacco use      Past Surgical History:   Procedure Laterality Date    BONE MARROW TRANSPLANT  2017    Mediport insertion      PR ARTHRP ACETBLR/PROX FEM PROSTC AGRFT/ALGRFT Left 10/20/2021    Procedure: LEFT POSTERIOR ARTHROPLASTY, HIP, TOTAL;  Surgeon: Randal Bury, MD;  Location: [facility];  Service: Orthopedics    PR ARTHRP ACETBLR/PROX FEM PROSTC AGRFT/ALGRFT Right 12/21/2022    Procedure: RIGHT POSTERIOR ARTHROPLASTY, HIP, TOTAL;  Surgeon: Randal Bury, MD;  Location: [facility];  Service: Orthopedics    PR AXILLARY LYMPHADENECTOMY COMPLETE Left 01/24/2021    Procedure: sentinel lymph node biopsy;  Surgeon: Paulino Bosch, MD;  Location: [facility];  Service: Oncology General    PR CARDIOVERSION ELECTIVE ARRHYTHMIA EXTERNAL N/A 09/24/2022    Procedure: Cardioversion;  Surgeon: Benn Brash, NP;  Location: [facility];  Service: Cardiovascular    PR MASTECTOMY PARTIAL Left 01/24/2021    Procedure: LEFT NEEDLE LOC PARTIAL MASTECTOMY   ;  Surgeon: Paulino Bosch, MD;  Location: [facility];  Service: Oncology General  ROTATOR CUFF REPAIR Right      ROS as above  Medications     Current Outpatient Medications   Medication Sig    metoprolol  succinate ER (TOPROL -XL) 50 mg 24 hr tablet Take 1 tablet (50 mg total) by mouth daily. Do not crush or chew. May be divided.    ondansetron  (ZOFRAN -ODT) 4 MG disintegrating tablet DISSOLVE 1 TABLET ON TOP OF THE TONGUE THREE TIMES DAILY AS NEEDED    multiple vitamins-minerals Take by mouth.    tirzepatide  (MOUNJARO ) 7.5 mg/0.59mL pen Inject 7.5 mg into the skin every 7 days.    apixaban  (ELIQUIS ) 5 mg tablet TAKE 1 TABLET(5 MG) BY MOUTH EVERY 12 HOURS    acyclovir  (ZOVIRAX ) 400 mg tablet Take 1 tablet (400 mg total) by mouth 2 times daily for Infection caused by the Varicella Zoster Virus.    albuterol  HFA (PROVENTIL , VENTOLIN )  108 (90 Base) MCG/ACT inhaler Inhale 2 puffs into the lungs every 6 hours as needed.    pregabalin  (LYRICA ) 100 mg capsule TAKE 1 CAPSULE(100 MG) BY MOUTH TWICE DAILY FOR NERVE PAIN. MAX DAILY DOSE: 200 MG    escitalopram  (LEXAPRO ) 10 mg tablet TAKE 1 TABLET(10 MG) BY MOUTH DAILY    nystatin -triamcinolone  (MYCOLOG II) cream Apply topically 2 times daily as needed for Skin Infection due to Candida Yeast. Apply to affected area. Do not apply on or near surgical site.    Lidocaine -Menthol  (NERVIVE ROLL-ON EX) Apply 1 g topically as needed. For toe pain    levothyroxine  (SYNTHROID , LEVOTHROID) 50 mcg tablet TAKE 1 TABLET BY MOUTH EVERY DAY    cyanocobalamin  (VITAMIN B-12) 500 mcg tablet Take 1 tablet (500 mcg total) by mouth daily for Inadequate Vitamin B12, peripheral neuropathy. (Patient not taking: Reported on 05/31/2023)    atorvastatin  (LIPITOR) 40 mg tablet TAKE 1 TABLET BY MOUTH IN THE EVENING    Menthol , Topical Analgesic, (ICY HOT EX) Apply 1 applicator topically as needed (pain).    Menthol , Topical Analgesic, (BIOFREEZE EX) Apply 1 applicator topically as needed (pain).     Vitals and Physical Exam     Carly Higgins's  height is 1.676 m (5\' 6" ) and weight is 116.1 kg (256 lb). Her blood pressure is 116/68 and her pulse is 74. Her oxygen saturation is 98%.  Body mass index is 41.32 kg/m.    Physical Exam  Constitutional:       Appearance: She is well-developed.   Neck:      Vascular: No JVD.   Cardiovascular:      Rate and Rhythm: Rhythm irregular.      Heart sounds: Normal heart sounds.      Comments: Irregularly irregular  Pulmonary:      Effort: Pulmonary effort is normal.      Breath sounds: Normal breath sounds.   Neurological:      Mental Status: She is alert.       Laboratory Data     Hematology:   Results in Past 730 Days  Result Component Current Result Previous Result   WBC 6.4 (05/20/2023) 5.5 (12/14/2022)   Hemoglobin 13.1 (05/20/2023) 11.4 (12/21/2022)   Hematocrit 42 (05/20/2023) 37 (12/21/2022)   Platelets  273 (05/20/2023) 197 (12/14/2022)     Chemistry:   Results in Past 730 Days  Result Component Current Result Previous Result   Sodium 139 (05/20/2023) 133 (12/21/2022)   Potassium 5.5 (H) (05/20/2023) 4.6 (12/21/2022)   Creatinine 0.85 (05/20/2023) 0.85 (12/21/2022)   Glucose 117 (H) (05/20/2023) 194 (H) (12/21/2022)  Calcium  10.1 (05/20/2023) 8.9 (12/21/2022)   Magnesium  1.9 (09/18/2022) Not in Time Range   Hemoglobin A1C 5.8 (H) (05/20/2023) 6.6 (H) (11/13/2022)   AST 23 (05/20/2023) 29 (12/14/2022)   ALT 17 (05/20/2023) 25 (12/14/2022)   TSH 2.62 (05/20/2023) 18.60 (H) (11/23/2022)     Coagulation Studies:   Results in Past 730 Days  Result Component Current Result Previous Result   aPTT 32.9 (11/23/2022) 32.9 (08/08/2022)   INR 1.2 (H) (11/23/2022) 1.2 (H) (08/08/2022)     Cardiac:   No results found for requested labs within last 730 days.     Lipids:   Results in Past 730 Days  Result Component Current Result Previous Result   Cholesterol 113 (11/13/2022) Not in Time Range   HDL 60 (11/13/2022) Not in Time Range   Triglycerides 75 (11/13/2022) Not in Time Range   LDL Calculated 38 (11/13/2022) Not in Time Range   Chol/HDL Ratio 1.9 (11/13/2022) Not in Time Range     Cardiac/Imaging Data & Risk Scores     ECG: Atrial fibrillation         ECHO COMPLETE 07/27/2022    Interpretation Summary    Patient is in atrial fibrillation with a fast ventricular rate of 110-130.    There is concentric hypertrophy. The left ventricular ejection fraction is mildly decreased. The calculated LVEF is 43% globally reduced.    Left atrial enlargement.    There is mild-to-moderate mitral regurgitation.    There is mild tricuspid regurgitation.    The size of the ascending aorta is borderline enlarged. Aortic Diameters: sinus = 2.8 cm, tubular = 3.7 cm, arch = 2.8 cm.    Compared to prior echocardiogram her overall LV function is more globally reduced to 43% and was 65%.  There is noted mitral regurgitation as well as tricuspid agitation.  Patient is in a  fast atrial fibrillation.           CARDIAC CATHETERIZATION 09/24/2022    Narrative  S/p successful cardioversion. Pt was shocked with 200 J and 360 J x 2 with conversion of NSR for a few second with early return of atrial fibrillation.           EPATCH 14-DAY MONITOR 07/19/2022    Narrative  Patient monitored for 14d, analyzable time was 11d 6h starting on 06/20/2022 04:00 pm.  Primary rhythm was Atrial Fibrillation / Flutter. Average heart rate was 99 bpm, Minimum heart rate was 59 bpm on Day 13  / 08:22:54 am, Max heart rate was 196 bpm on Day 2 / 07:26:20 pm  Atrial Fibrillation or Flutter: Burden was 54.95 %, longest event 3d 23h on Day 1 / 04:05:20 pm, fastest event 196 bpm on  Day 1 / 04:05:20 pm  SVE(s): Burden was 0.59 %, 9375 total SVE(s)  SV Arrhythmia(s): 46 event(s), longest event 23 beats on Day 6 / 12:42:21 am, fastest event 141 bpm on Day 8 / 10:31:51  am  PVC(s): Burden was 0.03 %, 430 total PVC(s), 3 disparate morphologies  Ventricular Arrhythmia(s): 1 event(s), longest event 4 beats at Day 15 / 10:25:52 am, fastest event 185 bpm at Day 15 /  10:25:52 am  Patient recorded 4 event(s) during the monitoring period  Cardioversion not required as patient paroxysmal.             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 female with continued persistent atrial fibrillation.  Heart rates remain rapid as well.  Will increase metoprolol  back up to 50 mg a day as her blood pressure does seem to be better now.  I think proceeding with catheter ablation at this point makes most sense given her symptomatic persistent atrial fibrillation as well as failure of amiodarone  previously.  We had previously discussed this and reviewed that once again.    Estimated chances of success are about 60-70% with one procedure. To maximize rhythm control outcomes, living a heart healthy lifestyle with diet and exercise along with weight loss if appropriate is indicated. If present, treatment of sleep apnea is also needed to experience the benefits of ablation. About a third of the time, patients may need subsequent procedures and/or consideration of antiarrhythmic medications.  Overall collective risks can be significant up to 5% and include, but are not limited to bleeding, infection, cardiac perforation with the possible need for emergent surgery, phrenic nerve injury, coronary vasospasm, myocardial infarction, significant vascular damage requiring surgery, severe pulmonary vein stenosis, acute renal failure, chance of needing a pacemaker, and risk of stroke given the left-sided ablation. There are rare complications (less than 1 in 500) of death and esophageal atrial fistula as well.  We are in agreement to proceed with catheter ablation.         Maddalyn Lutze P. Lydia Sams, MD  Electronically signed on 06/12/2023 at 8:37 AM.

## 2023-06-12 NOTE — Telephone Encounter (Signed)
 6/4 Patient accepted the date of 8/25 for her ablation procedure with Dr. Lydia Sams.

## 2023-06-24 ENCOUNTER — Other Ambulatory Visit: Payer: Self-pay | Admitting: Internal Medicine

## 2023-06-30 LAB — EKG 12-LEAD
QRS: 63 deg
QRSD: 76 ms
QT: 334 ms
QTc: 488 ms
Rate: 128 {beats}/min
T: 81 deg

## 2023-07-01 ENCOUNTER — Ambulatory Visit: Payer: Medicare Other | Admitting: Sleep Medicine

## 2023-07-04 ENCOUNTER — Ambulatory Visit: Admitting: Sports Medicine

## 2023-07-18 ENCOUNTER — Other Ambulatory Visit: Payer: Self-pay | Admitting: Internal Medicine

## 2023-07-24 ENCOUNTER — Telehealth: Payer: Self-pay | Admitting: Hematology

## 2023-07-24 NOTE — Telephone Encounter (Signed)
 Writer called patient to reschedule her follow up so it can be with the doctor. Patient is aware and all set.

## 2023-08-02 ENCOUNTER — Ambulatory Visit: Payer: Medicare Other

## 2023-08-09 ENCOUNTER — Other Ambulatory Visit: Payer: Self-pay | Admitting: Cardiology

## 2023-08-09 DIAGNOSIS — I4891 Unspecified atrial fibrillation: Secondary | ICD-10-CM

## 2023-08-09 MED ORDER — METOPROLOL SUCCINATE 50 MG PO TB24 *I*
75.0000 mg | ORAL_TABLET | Freq: Every day | ORAL | 0 refills | Status: DC
Start: 2023-08-09 — End: 2023-08-15

## 2023-08-09 NOTE — Telephone Encounter (Signed)
 Spoke with patient. She will increase her Metoprolol  to 75mg  and asks that we send a script to [facility] on [address] for pick up.   ---------------------------------------------------------  [name] SQUIBB, MD to Amb Card Ep Rn (Selected Message)      08/09/23  2:08 PM  I would try increasing metoprolol  to 75mg  daily and monitor BP.  BP may be inaccurate with rapid afib

## 2023-08-09 NOTE — Telephone Encounter (Signed)
 Spoke with patient. She reports that she has been staying at her sister's house while she is in [place]  and watching her dogs. She has stairs at the house and by the time she gets to the top of the stairs she is so winded that she feels like she is going to pass out. She states the symptoms go away usually after about 30 seconds or so of rest.   BP is typically low around what it was today - 98/80 but HR has been elevated especially with activity.  Earlier check today was 124bpm (see below).   Discussed that we might not want to increase her Metoprolol  due to low BP and we don't want to bottom her out but will discuss with Dr Tobie other options.

## 2023-08-09 NOTE — Telephone Encounter (Signed)
 Symptom Call    What symptom(s): SOB     Any heart rate, blood pressure or weights to report: BP 98/80 P 124     Have you had these symptoms today?: Yes     If yes, check warm transfer symptom list to see if requires a warm transfer to RN.  If yes, call warm transferred to [phone]: ______________RN (first name only).      Pt says she has been experiencing SOB and feels like she just ran a mile but hasn't been doing anything to cause her to be out of breath.  Pt is wondering if the metoprolol  can be increased.  Pt has an upcoming procedure with Dr Tobie on 09/02/23.  Please call pt to advise.

## 2023-08-12 ENCOUNTER — Ambulatory Visit

## 2023-08-12 ENCOUNTER — Other Ambulatory Visit: Payer: Self-pay

## 2023-08-12 ENCOUNTER — Telehealth: Payer: Self-pay

## 2023-08-12 VITALS — BP 122/82 | Ht 66.0 in | Wt 249.0 lb

## 2023-08-12 DIAGNOSIS — Z01812 Encounter for preprocedural laboratory examination: Secondary | ICD-10-CM

## 2023-08-12 DIAGNOSIS — Z01419 Encounter for gynecological examination (general) (routine) without abnormal findings: Secondary | ICD-10-CM | POA: Insufficient documentation

## 2023-08-12 DIAGNOSIS — I4891 Unspecified atrial fibrillation: Secondary | ICD-10-CM

## 2023-08-12 DIAGNOSIS — L292 Pruritus vulvae: Secondary | ICD-10-CM | POA: Insufficient documentation

## 2023-08-12 MED ORDER — NYSTATIN-TRIAMCINOLONE 100000-0.1 UNIT/GM-% EX OINT *I*
TOPICAL_OINTMENT | Freq: Two times a day (BID) | CUTANEOUS | 4 refills | Status: DC
Start: 2023-08-12 — End: 2023-08-28

## 2023-08-12 NOTE — Progress Notes (Signed)
 Carly Higgins is a 63 y.o. patient No obstetric history on file. who presents for an annual gyn exam.  Last seen in 2018. Periods are absent in the setting of menopause.  Denies pmb/spotting. Pap smear 08/15/2022 Mammogram:UTD . Has her next mammo scheduled. Last Mammogram: 09/17/2022 Breast cancer - [facility]. 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 Pcp following. Last seen in 2018. Has had a medically complex journey since her last visit. Breast cancer, bone marrow transplant, hip replacement (X2), struggling with neuropathy and A fib... ablation coming up in the near future. . Additional concerns- vaginal itching / burning intermittently for prolonged period of time. Patient's medications, allergies, past medical, surgical, social and family histories were reviewed and updated as appropriate.  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  Conditioned w/ Busulfan  130 mg/m2/d and Fludarabine  40 mg/m2/d x4 days followed by 10/10 HLA MUD (F) Allogeneic PBSCT (pt/donor: ABO: A+/A+, CMV N/N)  Pulmonary edema   s/p L THA 10/20/21 10/20/2021  Shortness of breath   Tobacco use   Past Surgical History:  Procedure Laterality Date  BONE MARROW TRANSPLANT  2017  Mediport insertion    PR ARTHRP ACETBLR/PROX FEM PROSTC AGRFT/ALGRFT Left 10/20/2021  Procedure: LEFT POSTERIOR ARTHROPLASTY, HIP, TOTAL;  Surgeon: Debrah Lot, MD;  [facility];  Service: Orthopedics  PR ARTHRP ACETBLR/PROX FEM PROSTC AGRFT/ALGRFT Right 12/21/2022  Procedure: RIGHT POSTERIOR ARTHROPLASTY, HIP, TOTAL;  Surgeon: Debrah Lot, MD;  [facility];  Service: Orthopedics  PR AXILLARY LYMPHADENECTOMY COMPLETE Left 01/24/2021  Procedure: sentinel lymph node biopsy;  Surgeon: Patterson Caldron, MD;  [facility];  Service: Oncology General  PR CARDIOVERSION ELECTIVE ARRHYTHMIA EXTERNAL N/A 09/24/2022  Procedure: Cardioversion;  Surgeon: Ana Warren CROME, NP;  Location: [facility] EP LABS;  Service: Cardiovascular  PR MASTECTOMY PARTIAL Left 01/24/2021  Procedure: LEFT NEEDLE LOC PARTIAL MASTECTOMY   ;  Surgeon: Patterson Caldron, MD;  [facility];  Service: Oncology General  ROTATOR CUFF REPAIR Right   Social History Socioeconomic History  Marital status: Divorced Occupational History  Occupation: on disability   Employer: [employer] Tobacco Use  Smoking status: Former   Packs/day: 0.25   Years: 31.00   Additional pack years: 0.00   Total pack years: 7.75   Types: Cigarettes   Quit date: 11/21/2022   Years since quitting: 0.7   Passive exposure: Past  Smokeless tobacco: Never  Tobacco comments:   Quit for surgery, states has a couple a day now since surgery Substance and Sexual Activity  Alcohol use: Not Currently  Drug use: No  Sexual activity: Not Currently Social History Narrative    Marital status: single  Lives in a second floor apartment alone   No children  Pets:  1 dog  Hobbies and interests include nothing at this time  Physical activity: none at this time  Family History Problem Relation Age of Onset   No Known Problems Mother   Throat cancer Father   Heart attack Father   Thyroid  disease Sister   No Known Problems Brother   Breast cancer Paternal Aunt   Ovarian cancer Neg Hx   Anesthesia problems Neg Hx   Current Outpatient Medications Medication Sig  metoprolol  succinate ER (TOPROL -XL) 50 mg 24 hr tablet Take 1.5 tablets (75 mg total) by mouth daily. Do not crush or chew. May be divided.  ondansetron  (ZOFRAN -ODT) 4 MG disintegrating tablet DISSOLVE 1 TABLET ON TOP OF TONGUE THREE TIMES DAILY AS NEEDED  ELIQUIS  5 MG tablet TAKE 1 TABLET(5 MG) BY MOUTH EVERY 12 HOURS  multiple vitamins-minerals Take by mouth.  tirzepatide  (MOUNJARO ) 7.5 mg/0.41mL pen Inject 7.5 mg into the skin every 7 days.  acyclovir  (ZOVIRAX ) 400 mg tablet Take 1 tablet (400 mg total) by mouth 2 times daily for Infection caused by the Varicella Zoster Virus.  albuterol  HFA (PROVENTIL , VENTOLIN ) 108 (90 Base) MCG/ACT inhaler Inhale 2 puffs into the lungs every 6 hours as needed.  pregabalin  (LYRICA ) 100 mg capsule TAKE 1 CAPSULE(100 MG) BY MOUTH TWICE DAILY FOR NERVE PAIN. MAX DAILY DOSE: 200 MG  escitalopram  (LEXAPRO ) 10 mg tablet TAKE 1 TABLET(10 MG) BY MOUTH DAILY  nystatin -triamcinolone  (MYCOLOG II) cream Apply topically 2 times daily as needed for Skin Infection due to Candida Yeast. Apply to affected area. Do not apply on or near surgical site.  Lidocaine -Menthol  (NERVIVE ROLL-ON EX) Apply 1 g topically as needed. For toe pain  levothyroxine  (SYNTHROID , LEVOTHROID) 50 mcg tablet TAKE 1 TABLET BY MOUTH EVERY DAY  atorvastatin  (LIPITOR) 40 mg tablet TAKE 1 TABLET BY MOUTH IN THE EVENING  Menthol , Topical Analgesic, (ICY HOT EX) Apply 1 applicator topically as needed (pain).  Menthol , Topical Analgesic, (BIOFREEZE EX) Apply 1 applicator topically as needed (pain).  nystatin -triamcinolone  (MYCOLOG) ointment Apply topically 2 times daily for Skin Infection due to Candida Yeast. To area as  directed by your Doctor  cyanocobalamin  (VITAMIN B-12) 500 mcg tablet Take 1 tablet (500 mcg total) by mouth daily for Inadequate Vitamin B12, peripheral neuropathy. (Patient not taking: Reported on 08/12/2023)  No Known Allergies (drug, envir, food or latex) Patient Active Problem List Diagnosis Code  Myeloproliferative disease D47.1  post allogenic MUD (F)  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 Z94.81  Immunocompromised D84.9  Bilateral hip pain. 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 No skin changes. No axillary or clavicular adenopathy. L breast- lumpectomy scar.  R side of chest above breast - mediport still in place. ABDOMEN: Soft, non-tender, no  palpable masses.PELVIC: Normal external female genitalia, vulva, vestibule and  clitoris. Bartholins, Skenes and urethral glands WNL.Urethra is normal without prolapse or tenderness.Vagina with no Atrophy, Cystocele, Rectocele, or  lesions.Cervix: Normal appearing, There is no CMTUterus:  midline, mobile, and non-tender. No prolapse.Adnexa are without obvious masses or tenderness.Perineum normal.EXTREMITIES: Bilaterally no edema.Assessment/Plan: 1. Well woman exam with routine gynecological examCotest Pap every 3 yearsPrior mammogram and yearly mammogram encouraged and discussed and assistance offered if neededColonoscopy referral offered and screening guidelines discussedCOUNSELING provided in areas desired by patient. - Vaginitis screen: DNA probe (VAG); Future2. Vulvovaginal itching- Vaginitis screen: DNA probe (VAG)- nystatin -triamcinolone  (MYCOLOG) ointment; Apply topically 2 times daily for Skin Infection due to Candida Yeast. To area as directed by your Doctor  Dispense: 60 g; Refill: 4Difficult to ascertain source of pts symptoms. Cultures per above to rule out infectious etiology. Plan to follow up via mychart or phone once results become available. Call office should symptoms persist or worsen.

## 2023-08-12 NOTE — Telephone Encounter (Signed)
 Patient is scheduled for an ablation on 8/25.  She is calling asking for pre procedure instructions.

## 2023-08-12 NOTE — Telephone Encounter (Signed)
 Spoke with pt regarding Afib ablation with Dr. Tobie, on 09/02/23, pt accepted date. Labs to be done 1-2 weeks prior at any [facility] lab.Instructed to hold Tirzepatide  7 days prior.Instructed to hold Eliquis  one dose morning on.Instructed not to eat after mn day of procedure, may take morning medications with sips of water .Post procedure instructions were reviewed and she expressed good understanding of the information. She was encouraged to contact the EP office with any questions or concerns and she agreed with this plan. Blue sheet completed.Instructions available in mychart under letters tab and mailed to patient.Carly Kurdziel-ERB, RN

## 2023-08-13 LAB — VAGINITIS SCREEN: DNA PROBE: Vaginitis Screen:DNA Probe: 0

## 2023-08-15 ENCOUNTER — Other Ambulatory Visit: Payer: Self-pay

## 2023-08-15 ENCOUNTER — Ambulatory Visit: Payer: Self-pay | Admitting: Internal Medicine

## 2023-08-15 VITALS — BP 122/82 | HR 98 | Wt 249.0 lb

## 2023-08-15 DIAGNOSIS — E119 Type 2 diabetes mellitus without complications: Secondary | ICD-10-CM

## 2023-08-15 DIAGNOSIS — F419 Anxiety disorder, unspecified: Secondary | ICD-10-CM

## 2023-08-15 DIAGNOSIS — E669 Obesity, unspecified: Secondary | ICD-10-CM

## 2023-08-15 DIAGNOSIS — I4891 Unspecified atrial fibrillation: Secondary | ICD-10-CM

## 2023-08-15 MED ORDER — METOPROLOL SUCCINATE 100 MG PO TB24 *I*
100.0000 mg | ORAL_TABLET | Freq: Every day | ORAL | Status: DC
Start: 2023-08-15 — End: 2023-09-02

## 2023-08-15 NOTE — Patient Instructions (Addendum)
 RTO 3 weeks / after ablation procedure - AF likely source of poor endurance and SOB; will increase metoprolol  to 100 mg once daily for now; no other med changes ; labs WNL; keep up with fluids; RTO for PE in 6 mos

## 2023-08-15 NOTE — Progress Notes (Signed)
 [facility] Connect[facility]ive  JASON HAUGE, [birth year], had concerns including Follow-up (Hard to catch breath lately not sure if something more going on wondering if an inhaler is recommended ).Going for ablation this month - HR has been higher  - taking metoprolol  75 mg; min activity shoots up HR. Is sweating profusely - sometimes changes clothes TID. Is not wheezing. Sx have been ongoing since June. Review of Systems Constitutional: Negative.  Respiratory:  Positive for shortness of breath (poor endurance - gets winded easily).  Cardiovascular:  Positive for palpitations (irreg HR). Negative for chest pain and leg swelling. Gastrointestinal: Negative.  Skin: Negative.  Neurological: Negative.   Objective Physical ExamVitals reviewed. Constitutional:     Appearance: Normal appearance. Cardiovascular:    Rate and Rhythm: Tachycardia present. Rhythm irregular.    Heart sounds: Normal heart sounds. Pulmonary:    Effort: Pulmonary effort is normal.    Breath sounds: Normal breath sounds. Abdominal:    Palpations: Abdomen is soft.    Tenderness: There is no abdominal tenderness. Musculoskeletal:    Cervical back: Neck supple. No tenderness.    Right lower leg: No edema.    Left lower leg: No edema. Lymphadenopathy:    Cervical: No cervical adenopathy. Skin:   General: Skin is warm and dry. Neurological:    Mental Status: She is alert and oriented to person, place, and time.    Gait: Gait normal. Psychiatric:       Mood and Affect: Mood normal. Vitals Blood pressure 122/82, pulse 98, weight 112.9 kg (249 lb), peak flow 98 L/min.Assessment & Plan 1. A-fib (Primary)Will try increasing metoprolol  to slow HR down and help regulate a little more to make her comfortable prior to havin gablation procedure done- metoprolol  succinate ER (TOPROL -XL) 100 mg 24  hr tablet; Take 1 tablet (100 mg total) by mouth daily for High Blood Pressure. Do not crush or chew. May be divided.2. AnxietyAppears fairly stable today despite being a little uncomfortable from AF3. ObesityUnchanged 4. Diabetes mellitusH/O DM - but doing well without meds Author: Jahlia Omura J Nikoleta Dady, NP  Note signed: 08/15/2023

## 2023-08-19 ENCOUNTER — Other Ambulatory Visit
Admission: RE | Admit: 2023-08-19 | Discharge: 2023-08-19 | Disposition: A | Discharge status: Home / Self Care | Source: Ambulatory Visit | Attending: Hematology and Oncology | Admitting: Hematology and Oncology

## 2023-08-19 DIAGNOSIS — Z01812 Encounter for preprocedural laboratory examination: Secondary | ICD-10-CM | POA: Insufficient documentation

## 2023-08-19 DIAGNOSIS — I4891 Unspecified atrial fibrillation: Secondary | ICD-10-CM | POA: Insufficient documentation

## 2023-08-19 DIAGNOSIS — Z171 Estrogen receptor negative status [ER-]: Secondary | ICD-10-CM | POA: Insufficient documentation

## 2023-08-19 DIAGNOSIS — C50412 Malignant neoplasm of upper-outer quadrant of left female breast: Secondary | ICD-10-CM | POA: Insufficient documentation

## 2023-08-19 LAB — CBC AND DIFFERENTIAL
Baso # K/uL: 0.1 THOU/uL (ref 0.0–0.2)
Eos # K/uL: 0.1 THOU/uL (ref 0.0–0.5)
Hematocrit: 44 % (ref 34–49)
Hemoglobin: 13.6 g/dL (ref 11.2–16.0)
IMM Granulocytes #: 0 THOU/uL
IMM Granulocytes: 0.1 %
Lymph # K/uL: 1.7 THOU/uL (ref 1.0–5.0)
MCV: 90 fL (ref 75–100)
Mono # K/uL: 0.7 THOU/uL (ref 0.1–1.0)
Neut # K/uL: 4.3 THOU/uL (ref 1.5–6.5)
Platelets: 283 THOU/uL (ref 150–450)
RBC: 4.9 MIL/uL (ref 4.0–5.5)
RDW: 16.1 % — ABNORMAL HIGH (ref 0.0–15.0)
Seg Neut %: 62 %
WBC: 6.9 THOU/uL (ref 3.5–11.0)

## 2023-08-19 LAB — COMPREHENSIVE METABOLIC PANEL
ALT: 20 U/L (ref 0–35)
AST: 18 U/L (ref 0–35)
Albumin: 4.3 g/dL (ref 3.5–5.2)
Alk Phos: 71 U/L (ref 35–105)
Anion Gap: 15 (ref 7–16)
Bilirubin,Total: 1 mg/dL (ref 0.0–1.2)
CO2: 24 mmol/L (ref 20–28)
Calcium: 9.7 mg/dL (ref 8.6–10.2)
Chloride: 103 mmol/L (ref 96–108)
Creatinine: 0.92 mg/dL (ref 0.51–0.95)
Glucose: 76 mg/dL (ref 60–99)
Lab: 21 mg/dL — ABNORMAL HIGH (ref 6–20)
Potassium: 5.1 mmol/L (ref 3.3–5.1)
Sodium: 142 mmol/L (ref 133–145)
Total Protein: 6.6 g/dL (ref 6.3–7.7)
eGFR BY CREAT: 70

## 2023-08-19 LAB — MULTIPLE ORDERING DOCS

## 2023-08-19 LAB — NEUTROPHIL #-INSTRUMENT: Neutrophil #-Instrument: 4.3 THOU/uL

## 2023-08-22 ENCOUNTER — Other Ambulatory Visit: Payer: Self-pay | Admitting: Internal Medicine

## 2023-08-22 ENCOUNTER — Telehealth: Payer: Self-pay | Admitting: Internal Medicine

## 2023-08-22 MED ORDER — ONDANSETRON 4 MG PO TBDP *I*: 4.0000 mg | ORAL_TABLET | Freq: Three times a day (TID) | ORAL | 2 refills | Status: DC | PRN

## 2023-08-22 NOTE — Telephone Encounter (Signed)
 Pt called asking for refill on Ondasontron- pt states has no refills.

## 2023-08-28 ENCOUNTER — Telehealth: Payer: Self-pay | Admitting: Cardiology

## 2023-08-28 ENCOUNTER — Ambulatory Visit: Attending: Cardiology | Admitting: Cardiology

## 2023-08-28 ENCOUNTER — Other Ambulatory Visit: Payer: Self-pay

## 2023-08-28 ENCOUNTER — Telehealth: Payer: Self-pay | Admitting: Hematology

## 2023-08-28 VITALS — BP 111/78 | HR 100 | Ht 66.0 in | Wt 246.0 lb

## 2023-08-28 DIAGNOSIS — I4891 Unspecified atrial fibrillation: Secondary | ICD-10-CM | POA: Insufficient documentation

## 2023-08-28 DIAGNOSIS — R9431 Abnormal electrocardiogram [ECG] [EKG]: Secondary | ICD-10-CM

## 2023-08-28 MED ORDER — FUROSEMIDE 20 MG PO TABS *I*
20.0000 mg | ORAL_TABLET | Freq: Every morning | ORAL | 1 refills | Status: DC
Start: 2023-08-28 — End: 2023-09-10

## 2023-08-28 NOTE — Telephone Encounter (Signed)
 Spoke to patient about rescheduling appointment. Patient stated not feeling well and waiting to reschedule. Will follow up next week with update.

## 2023-08-28 NOTE — Telephone Encounter (Signed)
 Symptom CallWhat symptom(s):SOBAny heart rate, blood pressure or weights to report:UnkHave you had these symptoms today?: yesIf yes, check warm transfer symptom list to see if requires a warm transfer to RN.If yes, call warm transferred to [phone]: ______________RN (first name only).Pt is calling stating that when she is about to fall asleep she feels like she's can't breathe and her heart starts to race. Pt does not check her blood pressure or her HR. Pt feels fine at the moment because she's not doing anything. Pt feels extremely tired from not being able to sleep. No chestpains but does feel a pain on the top of her stomach. Pt is scheduled for ablation 8/25. Please advise.

## 2023-08-28 NOTE — Telephone Encounter (Signed)
 Booking RequestProvider to book appt with:Pink teamAppt Visit Type needed: FUV Diagnosis or history if available (for NPV only):Timeframe patient needs to be seen in:3 monthsOffice preference: Who are we contacting with appt information:PatientBest contact phone number:[phone] comments:

## 2023-08-28 NOTE — Telephone Encounter (Signed)
 Spoke with Carly Higgins. She is scheduled for an atrial fibrillation ablation on 09/02/23 with Dr. Tobie. C/O increased HR, SOB, abdominal bloating. She wakes up at night gasping for breath and is very fatigued.Denies edema, does endorse abdominal bloating. Has lost weight (on mounjaro ) Doesn't have home ability to check HR, BP, or oxygen saturation. 08/15/23 PCP's office visit increased Metoprolol  from 50 mg to 100 mg daily. She thinks her HR is worse (higher). Spoke with Isabella, NP who agreed to have pt come in for an OV today to assess and potentially optimize prior to [facility] ablation. [name] agreed to this plan and very thankful for the appointment.

## 2023-08-28 NOTE — Telephone Encounter (Signed)
 Pt calling to reschedule apt tomorrow 8/21 with Dr.Liesveld. Asking for call back tomorrow.

## 2023-08-28 NOTE — Patient Instructions (Signed)
 Please take Lasix  20 mg once daily for 3 daysPlease increase Toprol  100 mg in am, and 50 mg pmDo not take evening dose if top BP is under 100.

## 2023-08-28 NOTE — Progress Notes (Signed)
 [facility]      Electrophysiology [address], [place]  [phone]/20/2025Dear Dr. Ty:We saw your patient, [name], at the [facility] in follow-up of Atrial Fibrillation.History of Present Illness:  Carly Higgins is a 63 y.o. female with a history of atrial fibrillation which was initially paroxysmal and then progressed to persistent. She was cardioverted in September 2024 but had early return of atrial fibrillation.  She was discharged on apixaban  (Eliquis ) 5 mg BID, Amiodarone  200 mg twice daily and Metoprolol  Succinate Toprol  XL 150 mg daily.   We were previously planning A-fib ablation, but  she wanted to have her hip surgery in Dec, for which she would need to hold eliquis , which is too early post ablation, so she elected to postpone AF ablation.  We ended up stopping amiodarone  and increasing Toprol -XL to 200 mg a day.  She did have her hip replacement surgery done and went to [place]  for the winter.  Unfortunately she had bronchitis and possibly COVID.  She had significant hypotension and metoprolol  was cut back.  She recently started 25 mg.  She continues with palpitations as well as shortness of breath on exertion.She is now scheduled for PVI with PFA on 09/02/2023. Her dose of Metoprolol  has been increased to 75 mg due to dyspnea felt to be related to tachy rates associated with the AF. Today, Carly Higgins states she has been experiencing dyspnea and palpitations for the past few weeks. She denies syncope, presyncope or chest pain. She has contained with palpitations which she describes as heart racing. She also been experiencing increased shortness fo breath, and she reports waking up and having to sit up at night due to her dyspnea. She also notes abdominal bloating, and feels it is increased, as she has had it since starting Mounjaro  a few months ago. Past Medical History[1]Past  Surgical History[2]Current Outpatient Medications Medication Sig Note  escitalopram  (LEXAPRO ) 10 mg tablet TAKE 1 TABLET(10 MG) BY MOUTH DAILY   metoprolol  succinate ER (TOPROL -XL) 100 mg 24 hr tablet Take 1 tablet (100 mg total) by mouth daily for High Blood Pressure. Do not crush or chew. May be divided. (Patient taking differently: Take 1.5 tablets (150 mg total) by mouth daily for High Blood Pressure. Do not crush or chew. May be divided.)   ELIQUIS  5 MG tablet TAKE 1 TABLET(5 MG) BY MOUTH EVERY 12 HOURS   tirzepatide  (MOUNJARO ) 7.5 mg/0.5mL pen Inject 7.5 mg into the skin every 7 days.   acyclovir  (ZOVIRAX ) 400 mg tablet Take 1 tablet (400 mg total) by mouth 2 times daily for Infection caused by the Varicella Zoster Virus.   levothyroxine  (SYNTHROID , LEVOTHROID) 50 mcg tablet TAKE 1 TABLET BY MOUTH EVERY DAY 11/23/2022: A.M.  atorvastatin  (LIPITOR) 40 mg tablet TAKE 1 TABLET BY MOUTH IN THE EVENING   furosemide  (LASIX ) 20 mg tablet Take 1 tablet (20 mg total) by mouth every morning.   ondansetron  (ZOFRAN -ODT) 4 MG disintegrating tablet Take 1 tablet (4 mg total) by mouth 3 times daily as needed. Place on top of tongue.   albuterol  HFA (PROVENTIL , VENTOLIN ) 108 (90 Base) MCG/ACT inhaler Inhale 2 puffs into the lungs every 6 hours as needed. (Patient not taking: Reported on 08/28/2023)   nystatin -triamcinolone  (MYCOLOG II) cream Apply topically 2 times daily as needed for Skin Infection due to Candida Yeast. Apply to affected area. Do not apply on or near surgical site.   Lidocaine -Menthol  (NERVIVE ROLL-ON EX) Apply 1 g topically as needed.  For toe pain   Menthol , Topical Analgesic, (ICY HOT EX) Apply 1 applicator topically as needed (pain).   Menthol , Topical Analgesic, (BIOFREEZE EX) Apply 1 applicator topically as needed (pain).  No current facility-administered medications for this visit. Allergies[3]Review of SystemsConstitution: Negative for chills, fever and  weakness. Cardiovascular: Negative for chest pain, claudication, dyspnea on exertion, irregular heartbeat, leg swelling, near-syncope, orthopnea, palpitations and syncope. Respiratory: Negative for cough, hemoptysis, shortness of breath and sputum production. Neurological: Negative for dizziness. Psychiatric/Behavioral: Negative for altered mental status. Physical ExamVitals:  08/28/23 1352 BP: 111/78 Pulse: 100 Weight: 111.6 kg (246 lb) Height: 1.676 m (5' 6) Constitutional: Appears healthy. No distress. Neck: Normal range of motion. No JVD present. Cardiovascular: Tachycardic rate, regular rhythm, S1 normal, S2 normal and intact distal pulses. Exam reveals no gallop. No murmur heard. Pulmonary/Chest: Effort normal with crackles in bases. Abdominal: Soft. Musculoskeletal: Normal range of motion. No edema. Neurological: Alert and oriented to person, place, and time. Normal motor skills. Gait normal. Skin: Skin is warm and dryCardiac TestingECG:  AF @ 112 bpmAssessment/Plan:Carly Higgins is a 63 y.o. year old female with a history of initially paroxysmal now persistent AF seen today for symptoms of dyspnea and tachycardia prior to her scheduled AF ablation on 09/02/2023. Carly Higgins continues to experience AF with elevated ventricular rates. She also has some crackles in the bases and what sounds like PND. She has been asked to increase her Toprol  from 63 mg to 63 mg in am, and 50 mg in pm. She will check her BP and HR with a monitor her mother will loan her, and hold if systolic < 100. She was also prescribed Lasix  20 mg x 3 days. She will notify our office how she is doing the next 1-2 days. She is scheduled for ablation 8/25, and will communicate with our office in the interim.We appreciate the opportunity to participate in the care of your patient. If you have any questions or concerns please feel free to contact our office.Sincerely,Carly Higgins, NPI personally spent 31 minutes on the calendar day of the encounter, including pre and post visit work. 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  Conditioned w/ Busulfan  130 mg/m2/d and Fludarabine  40 mg/m2/d x4 days followed by 10/10 HLA MUD (F) Allogeneic PBSCT (pt/donor: ABO: A+/A+, CMV N/N)  Pulmonary edema   s/p L THA 10/20/21 10/20/2021  Shortness of breath   Tobacco use  [2] Past Surgical History:Procedure Laterality Date  BONE MARROW TRANSPLANT  2017  Mediport insertion    PR ARTHRP ACETBLR/PROX FEM PROSTC AGRFT/ALGRFT Left 10/20/2021  Procedure: LEFT POSTERIOR ARTHROPLASTY, HIP, TOTAL;  Surgeon: Debrah Lot, MD;  [facility];  Service: Orthopedics  PR ARTHRP ACETBLR/PROX FEM PROSTC AGRFT/ALGRFT Right 12/21/2022  Procedure: RIGHT POSTERIOR ARTHROPLASTY, HIP, TOTAL;  Surgeon: Debrah Lot, MD;  [facility];  Service: Orthopedics  PR AXILLARY LYMPHADENECTOMY COMPLETE Left 01/24/2021  Procedure: sentinel lymph node biopsy;  Surgeon: Patterson Caldron, MD;  Location[facility] MAIN  OR;  Service: Oncology General  PR CARDIOVERSION ELECTIVE ARRHYTHMIA EXTERNAL N/A 09/24/2022  Procedure: Cardioversion;  Surgeon: Ana Warren CROME, NP;  Location: [facility] EP LABS;  Service: Cardiovascular  PR MASTECTOMY PARTIAL Left 01/24/2021  Procedure: LEFT NEEDLE LOC PARTIAL MASTECTOMY   ;   Surgeon: Patterson Caldron, MD;  [facility];  Service: Oncology General  ROTATOR CUFF REPAIR Right  [3] No Known Allergies (drug, envir, food or latex)

## 2023-08-29 ENCOUNTER — Telehealth: Payer: Self-pay

## 2023-08-29 ENCOUNTER — Ambulatory Visit: Admitting: Hematology

## 2023-08-29 DIAGNOSIS — D471 Chronic myeloproliferative disease: Secondary | ICD-10-CM

## 2023-08-29 NOTE — Telephone Encounter (Signed)
 Pt scheduled for PVI with PFA on 09/02/2023, seen with EP yesterday, today she reports BP is 116/94 and her Pulse is 135 at rest and better sleep last night after increase her Toprol  from 100 mg to 100 mg in am, and 50 mg in pm and prescribed Lasix  20 mg x 3 days. SW pt today, she is asymptomatic. She slept well last night, her heart races when she walks around today but its barely noticeable, slightly winded when walking. She states she can breath a lot better. She reports good UOP. She reports her HR was taken again later today and was 129 at rest.Will update providers, she is wondering if tomorrow is her last day of lasix  should she take any further.

## 2023-08-29 NOTE — Telephone Encounter (Signed)
 Patient calling to say thank you and she is feeling 100% better and she was able to sleep last and her BP is 116/94 and her Pulse is 135

## 2023-08-30 NOTE — Telephone Encounter (Addendum)
 SW pt today HR 109 today, BP 108/81, she feels good today we will proceed with PVI with PFA on 09/02/2023.--[place], Beth A, NPI think 3 days should be fine for now. Sounds like she is doing better overall.

## 2023-08-31 ENCOUNTER — Other Ambulatory Visit: Payer: Self-pay | Admitting: Internal Medicine

## 2023-09-01 NOTE — Anesthesia Preprocedure Evaluation (Signed)
 Anesthesia Pre-operative History and Physical for Carly HERO TaylorHighlighted Issues for this Procedure:63 y.o. female with Atrial Fibrillation presenting for Procedure(s) (LRB):Ablation - A-Fib (N/A) by Surgeon(s):Patel, Parag P, MD scheduled for 135 minutes.BMI Readings from Last 1 Encounters:08/28/23 : 39.71 kg/mStress Test/Echocardiography:ECHO COMPLETE 07/27/2022 Interpretation Summary  Patient is in atrial fibrillation with a fast ventricular rate of 110-130.  There is concentric hypertrophy. The left ventricular ejection fraction is mildly decreased. The calculated LVEF is 43% globally reduced.  Left atrial enlargement.  There is mild-to-moderate mitral regurgitation.  There is mild tricuspid regurgitation.  The size of the ascending aorta is borderline enlarged. Aortic Diameters: sinus = 2.8 cm, tubular = 3.7 cm, arch = 2.8 cm.  Compared to prior echocardiogram her overall LV function is more globally reduced to 43% and was 65%.  There is noted mitral regurgitation as well as tricuspid agitation.  Patient is in a fast atrial fibrillationElectrophysiology/AICD/Pacer:EKG 11/14/22: Atrial fibrillation, HR 96, QTc 469Borderline  low voltage, extremity leads EPATCH 14-DAY MONITOR 07/11/2024Patient monitored for 14d, analyzable time was 11d 6h starting on 06/20/2022 04:00 pm.Primary rhythm was Atrial Fibrillation / Flutter. Average heart rate was 99 bpm, Minimum heart rate was 59 bpm on Day 13/ 08:22:54 am, Max heart rate was 196 bpm on Day 2 / 07:26:20 pmAtrial Fibrillation or Flutter: Burden was 54.95 %, longest event 3d 23h on Day 1 / 04:05:20 pm, fastest event 196 bpm onDay 1 / 04:05:20 pmSVE(s): Burden was 0.59 %, 9375 total SVE(s)SV Arrhythmia(s): 46 event(s), longest event 23 beats on Day 6 / 12:42:21 am, fastest event 141 bpm on Day 8 / 10:31:51amPVC(s): Burden was 0.03 %, 430 total PVC(s), 3 disparate  morphologiesVentricular Arrhythmia(s): 1 event(s), longest event 4 beats at Day 15 / 10:25:52 am, fastest event 185 bpm at Day 15 /10:25:52 amPatient recorded 4 event(s) during the monitoring periodCardioversion not required as patient paroxysmal.Cardiac /Vascular Catheterization: CARDIAC CATHETERIZATION 09/16/2024S/p successful cardioversion. Pt was shocked with 200 J and 360 J x 2 with conversion of NSR for a few second with early return of atrial fibrillation. Pulmonary Function Tests:03/28/16 PFTs: FEV1 2.17, FVC 2.78, FEV1/FVC 78%, DLCO 15.91Impression: no significant change compared to prior testing. Isolated reduction in the diffusing capacity, consistent with pulmonary vascular disease (including pulmonary edema) or interstitial lung disease (including emphysema). Clinical correlation is suggested. The reduced FEV1 and FVC and normal FEV1/FVC suggest a restrictive ventilatory deficit. However, this is not confirmed by lung volume measurement because TLC is normal. Serial tests may reveal the presence of an ongoing restrictive process. SABRA.Anesthesia Evaluation Information Source: records, patient   ANESTHESIA HISTORY     Denies anesthesia historyPertinent(-):  No History of anesthetic complications or Family hx of anesthetic complicationsGENERALPertinent (-):  No Anesthesia monitoring restrictions, infection, history of anesthetic complications or Family Hx of Anesthetic ComplicationsHEENT  + Visual Impairment        corrective lens for ADLPertinent (-):  No glaucoma, hearing loss, TMJD, sinus issues, nosebleeds or neck pain PULMONARY  + Smoker        tobacco quit recently  + Snoring        daytime tiredness  + Sleep apnea        high suspicionPertinent(-):  No asthma, shortness of breath, recent URI or COPDCARDIOVASCULARGood(4+METs) Exercise Tolerance  + Cardiac Testing        more details above, echo, 43% ejection fraction  +  Anticoagulants        apixaban   + Hx of Dysrhythmias  atrial fib, cardioverted  + CHF        HFrEFPertinent(-):  No past MI, angina, CABG, orthopnea or hx of DVT  Comment: Limited d/t pain to hip and neuropathy to feet. She does housework and Pharmacologist. She uses a scooter to walk the dog. DASI score: 18.95 points, 5.07 METSGI/HEPATIC/RENAL NPO: > 8hrs ago (solids) and > 2hrs ago (clears)  + Nausea (Endorses from Mounjaro  usage)  + Vomiting (Endorses from Mounjaro  usage)Pertinent(-):  No GERD, alcohol use, liver  issues, bowel issues, renal issues, urinary issues or  esophageal issues  NEURO/PSYCH/ORTHO  + Chronic pain        lower back  + Neuropsychiatric Issues        depression  + Gait/Mobility issues        cane, walkerPertinent(-):  No headaches, dizziness/motion sickness, syncope, seizures, cerebrovascular event or neuromuscular diseaseENDO/OTHER  + Diabetes Mellitus        Type 2 no insulin   + Thyroid  Disease        HYPOthyroid   + Chemo HxPertinent(-):  No hormone use, steroid use, menstruating (post-menopause)HEMATOLOGIC  + Bruises/bleeds easily  + Anticoagulants/Antiplatelets        apixaban   + Blood Transfusion (Bone marrow transfusion of rmyeloproliferative dz, 2017. No reaction)  + Blood dyscrasia        hyperlipidemia  + Arthritis        lumbar and hipsPertinent(-):  No coagulopathy Physical ExamAirway          Mouth opening: normal          Mallampati: II          TM distance (fb): >3 FB          TM distance (cm): 3          Neck ROM: fullDental   Normal Exam Cardiovascular         Rhythm: regular         Rate: normalNo weak pulsesNeurologic    Normal ExamGeneral Survey    Normal Exam Pulmonary   Normal ExamMental Status   Normal Exam ________________________________________________________________________PLANASA Score  3Anesthetic Plan general  Induction (routine IV) General Anesthesia/Sedation Maintenance Plan (inhaled agents);  Airway Manipulation (direct laryngoscopy); Airway (cuffed ETT); Line ( use current access and additional large bore IV); Monitoring (standard ASA); Positioning (supine); PONV Plan (dexamethasone  and ondansetron ); Pain (per surgical team); PostOp (PACU)Standard AttestationInformed Consent   Risks:        Risks discussed were commensurate with the plan listed above with the following specific points: N/V, aspiration, sore throat, hypotension, emergence delirium and dizziness, Damage to: teeth, eyes, nerves and blood vessels, allergic Rx and unexpected serious injury.  Anesthetic Consent:       Anesthetic plan (and risks as noted above) were discussed with patientResponsible Anesthesia Provider Attestation:I attest that the patient or proxy understands and accepts the risks and benefits of the anesthesia plan. I also attest that I have personally performed a pre-anesthetic examination and evaluation, and prescribed the anesthetic plan for this particular location within 48 hours prior to the anesthetic as documented. Mliss Old, MD  09/02/23, 9:21 AM

## 2023-09-02 ENCOUNTER — Encounter
Admission: RE | Disposition: A | Discharge status: Home / Self Care | Payer: Self-pay | Source: Ambulatory Visit | Attending: Cardiology

## 2023-09-02 ENCOUNTER — Ambulatory Visit
Admission: RE | Admit: 2023-09-02 | Discharge: 2023-09-02 | Disposition: A | Discharge status: Home / Self Care | Payer: Self-pay | Source: Ambulatory Visit | Attending: Cardiology | Admitting: Cardiology

## 2023-09-02 ENCOUNTER — Encounter: Payer: Self-pay | Admitting: Anesthesiology

## 2023-09-02 ENCOUNTER — Ambulatory Visit: Payer: Self-pay | Admitting: Anesthesiology

## 2023-09-02 ENCOUNTER — Encounter: Payer: Self-pay | Admitting: Cardiology

## 2023-09-02 DIAGNOSIS — Z7985 Long-term (current) use of injectable non-insulin antidiabetic drugs: Secondary | ICD-10-CM | POA: Insufficient documentation

## 2023-09-02 DIAGNOSIS — I081 Rheumatic disorders of both mitral and tricuspid valves: Secondary | ICD-10-CM | POA: Insufficient documentation

## 2023-09-02 DIAGNOSIS — K219 Gastro-esophageal reflux disease without esophagitis: Secondary | ICD-10-CM | POA: Insufficient documentation

## 2023-09-02 DIAGNOSIS — I480 Paroxysmal atrial fibrillation: Secondary | ICD-10-CM

## 2023-09-02 DIAGNOSIS — E785 Hyperlipidemia, unspecified: Secondary | ICD-10-CM | POA: Insufficient documentation

## 2023-09-02 DIAGNOSIS — I4891 Unspecified atrial fibrillation: Secondary | ICD-10-CM | POA: Diagnosis present

## 2023-09-02 DIAGNOSIS — I5020 Unspecified systolic (congestive) heart failure: Secondary | ICD-10-CM | POA: Insufficient documentation

## 2023-09-02 DIAGNOSIS — E119 Type 2 diabetes mellitus without complications: Secondary | ICD-10-CM | POA: Insufficient documentation

## 2023-09-02 DIAGNOSIS — Z8679 Personal history of other diseases of the circulatory system: Secondary | ICD-10-CM

## 2023-09-02 DIAGNOSIS — E039 Hypothyroidism, unspecified: Secondary | ICD-10-CM | POA: Insufficient documentation

## 2023-09-02 DIAGNOSIS — Z87891 Personal history of nicotine dependence: Secondary | ICD-10-CM | POA: Insufficient documentation

## 2023-09-02 DIAGNOSIS — Z9889 Other specified postprocedural states: Secondary | ICD-10-CM

## 2023-09-02 HISTORY — DX: Personal history of other diseases of the circulatory system: Z86.79

## 2023-09-02 HISTORY — PX: PR COMPRE EP EVAL ABLTJ ATR FIB PULM VEIN ISOLATION: 93656

## 2023-09-02 LAB — POCT GLUCOSE
Glucose POCT: 105 mg/dL — ABNORMAL HIGH (ref 60–99)
Glucose POCT: 164 mg/dL — ABNORMAL HIGH (ref 60–99)

## 2023-09-02 LAB — ACT PLUS, POCT: ACT PLUS, POCT: 379 s — ABNORMAL HIGH (ref 96–152)

## 2023-09-02 SURGERY — ABLATION - A-FIB
Anesthesia: General

## 2023-09-02 MED ORDER — ACETAMINOPHEN 500 MG PO TABS *I*
ORAL_TABLET | ORAL | Status: AC
Start: 2023-09-02 — End: 2023-09-02
  Filled 2023-09-02: qty 2

## 2023-09-02 MED ORDER — LIDOCAINE-EPINEPHRINE 1 %-1:100000 IJ SOLN *I*
INTRAMUSCULAR | Status: AC
Start: 2023-09-02 — End: 2023-09-02
  Filled 2023-09-02: qty 50

## 2023-09-02 MED ORDER — MIDAZOLAM HCL 1 MG/ML IJ SOLN *I* WRAPPED
INTRAMUSCULAR | Status: DC | PRN
Start: 2023-09-02 — End: 2023-09-02
  Administered 2023-09-02 (×2): 1 mg via INTRAVENOUS

## 2023-09-02 MED ORDER — ROCURONIUM BROMIDE 10 MG/ML IV SOLN *WRAPPED*
Status: AC
Start: 2023-09-02 — End: 2023-09-02
  Filled 2023-09-02: qty 10

## 2023-09-02 MED ORDER — MENTHOL THROAT LOZENGE *I*
LOZENGE | OROMUCOSAL | Status: AC
Start: 2023-09-02 — End: 2023-09-02
  Filled 2023-09-02: qty 1

## 2023-09-02 MED ORDER — PROTAMINE SULFATE 10 MG/ML IV SOLN *I*
INTRAVENOUS | Status: DC | PRN
Start: 2023-09-02 — End: 2023-09-02
  Administered 2023-09-02: 30 mg via INTRAVENOUS

## 2023-09-02 MED ORDER — PROPOFOL 10 MG/ML IV EMUL (INTERMITTENT DOSING) WRAPPED *I*
INTRAVENOUS | Status: AC
Start: 2023-09-02 — End: 2023-09-02
  Filled 2023-09-02: qty 20

## 2023-09-02 MED ORDER — METOPROLOL SUCCINATE 100 MG PO TB24 *I*
150.0000 mg | ORAL_TABLET | Freq: Every day | ORAL | Status: DC
Start: 2023-09-02 — End: 2023-11-26

## 2023-09-02 MED ORDER — SUGAMMADEX SODIUM 100 MG/1ML IV SOLN *WRAPPED*
INTRAVENOUS | Status: AC
Start: 2023-09-02 — End: 2023-09-02
  Filled 2023-09-02: qty 1

## 2023-09-02 MED ORDER — ACETAMINOPHEN 500 MG PO TABS *I*
1000.0000 mg | ORAL_TABLET | Freq: Four times a day (QID) | ORAL | Status: DC | PRN
Start: 2023-09-02 — End: 2023-09-02
  Administered 2023-09-02: 1000 mg via ORAL

## 2023-09-02 MED ORDER — MIDAZOLAM HCL 1 MG/ML IJ SOLN *I* WRAPPED
INTRAMUSCULAR | Status: DC | PRN
Start: 2023-09-02 — End: 2023-09-02

## 2023-09-02 MED ORDER — FENTANYL CITRATE 50 MCG/ML IJ SOLN *WRAPPED*
INTRAMUSCULAR | Status: DC | PRN
Start: 2023-09-02 — End: 2023-09-02
  Administered 2023-09-02: 25 ug via INTRAVENOUS
  Administered 2023-09-02: 50 ug via INTRAVENOUS
  Administered 2023-09-02: 25 ug via INTRAVENOUS

## 2023-09-02 MED ORDER — MIDAZOLAM HCL 1 MG/ML IJ SOLN *I* WRAPPED
INTRAMUSCULAR | Status: AC
Start: 2023-09-02 — End: 2023-09-02
  Filled 2023-09-02: qty 2

## 2023-09-02 MED ORDER — FENTANYL CITRATE 50 MCG/ML IJ SOLN *WRAPPED*
INTRAMUSCULAR | Status: AC
Start: 2023-09-02 — End: 2023-09-02
  Filled 2023-09-02: qty 2

## 2023-09-02 MED ORDER — MENTHOL THROAT LOZENGE *I*
1.0000 | LOZENGE | OROMUCOSAL | Status: DC | PRN
Start: 2023-09-02 — End: 2023-09-02
  Administered 2023-09-02: 1 via BUCCAL

## 2023-09-02 MED ORDER — ROCURONIUM BROMIDE 10 MG/ML IV SOLN *WRAPPED*
Status: DC | PRN
Start: 2023-09-02 — End: 2023-09-02
  Administered 2023-09-02: 10 mg via INTRAVENOUS
  Administered 2023-09-02: 50 mg via INTRAVENOUS

## 2023-09-02 MED ORDER — ONDANSETRON HCL 2 MG/ML IV SOLN *I*
4.0000 mg | Freq: Once | INTRAMUSCULAR | Status: AC
Start: 2023-09-02 — End: 2023-09-02
  Administered 2023-09-02: 4 mg via INTRAVENOUS

## 2023-09-02 MED ORDER — ONDANSETRON HCL 2 MG/ML IV SOLN *I*
INTRAMUSCULAR | Status: DC | PRN
Start: 2023-09-02 — End: 2023-09-02
  Administered 2023-09-02: 4 mg via INTRAVENOUS

## 2023-09-02 MED ORDER — ONDANSETRON HCL 2 MG/ML IV SOLN *I*
INTRAMUSCULAR | Status: AC
Start: 2023-09-02 — End: 2023-09-02
  Filled 2023-09-02: qty 2

## 2023-09-02 MED ORDER — HEPARIN SODIUM (PORCINE) 1000 UNIT/ML IJ SOLN *WRAPPED*
Status: AC
Start: 2023-09-02 — End: 2023-09-02
  Filled 2023-09-02: qty 20

## 2023-09-02 MED ORDER — PHENYLEPHRINE 100 MCG/ML IN NS 10 ML *WRAPPED*
INTRAMUSCULAR | Status: AC
Start: 2023-09-02 — End: 2023-09-02
  Filled 2023-09-02: qty 10

## 2023-09-02 MED ORDER — PROTAMINE SULFATE 10 MG/ML IV SOLN *I*
INTRAVENOUS | Status: AC
Start: 2023-09-02 — End: 2023-09-02
  Filled 2023-09-02: qty 5

## 2023-09-02 MED ORDER — OXYCODONE HCL 5 MG PO TABS *I*
5.0000 mg | ORAL_TABLET | ORAL | Status: DC | PRN
Start: 2023-09-02 — End: 2023-09-02

## 2023-09-02 MED ORDER — HEPARIN SODIUM (PORCINE) 1000 UNIT/ML IJ SOLN *WRAPPED*
Status: DC | PRN
  Administered 2023-09-02: 17000 [IU] via INTRAVENOUS

## 2023-09-02 MED ORDER — DEXAMETHASONE SODIUM PHOSPHATE 4 MG/ML INJ SOLN *WRAPPED*
INTRAMUSCULAR | Status: AC
Start: 2023-09-02 — End: 2023-09-02
  Filled 2023-09-02: qty 1

## 2023-09-02 MED ORDER — PROPOFOL 10 MG/ML IV EMUL (INTERMITTENT DOSING) WRAPPED *I*
INTRAVENOUS | Status: DC | PRN
Start: 2023-09-02 — End: 2023-09-02
  Administered 2023-09-02: 140 mg via INTRAVENOUS

## 2023-09-02 MED ORDER — ATROPINE SULFATE 0.1 MG/ML IJ SOLN *I*
INTRAMUSCULAR | Status: AC
Start: 2023-09-02 — End: 2023-09-02
  Filled 2023-09-02: qty 10

## 2023-09-02 MED ORDER — SODIUM CHLORIDE 0.9 % IV SOLN WRAPPED *I*
Status: DC | PRN
Start: 2023-09-02 — End: 2023-09-02

## 2023-09-02 MED ORDER — SUGAMMADEX SODIUM 100 MG/1ML IV SOLN *WRAPPED*
INTRAVENOUS | Status: DC | PRN
Start: 2023-09-02 — End: 2023-09-02
  Administered 2023-09-02 (×2): 100 mg via INTRAVENOUS

## 2023-09-02 MED ORDER — PLASMA-LYTE IV SOLN *WRAPPED*
Status: DC | PRN
Start: 2023-09-02 — End: 2023-09-02

## 2023-09-02 SURGICAL SUPPLY — 19 items
BLANKET FULL UNDERBDY 36 X 84 (Supply) ×1 IMPLANT
CABLE CONN FOR PULSED FLD (Supply) ×1 IMPLANT
CABLE CONNECTOR RFP 100A REPRO (Supply) ×1 IMPLANT
CATH INTCARD ECHOCARDIOGRAPHY 9FR X 90CM INTRO 10FR 4 W (Cardiac Catheter (Interventional)) ×1 IMPLANT
CATH PULSED FLD ABLAT 35MM (Cardiac Catheter (Interventional)) ×1 IMPLANT
COVER ULTRASOUND PROBE TP 6 X 48IN (Supply) ×1 IMPLANT
GUIDEWIRE THSCF-32-180-3-AES (Guidewire) ×1 IMPLANT
GUIDEWIRE VASC 0.035INX180CM J (Guidewire) ×1 IMPLANT
INTRO 9FRX12CM SHTH W/.038IN GWIRE ENGAGE (Sheath) ×1 IMPLANT
INTRODUCER SL0 8.5F BRAIDED (Sheath) ×1 IMPLANT
KIT SURF ELECTRD EP SYS NAVX ARRY ENSITE X (Supply) ×1 IMPLANT
NEEDLE NRG TRANS C0 EEPROM 71CM (Needle) ×1 IMPLANT
PACK CUSTOM EP CATHETER (Pack) ×1 IMPLANT
PAD ELECTRODE DEFIB POST LG (Supply) ×1 IMPLANT
PAD GROUNDING ELECTROSURGICAL 15FT DISP (Supply) ×2 IMPLANT
SHEATH CATH 13FR X 74CM CLR (Sheath) ×1 IMPLANT
SHEATH PINNACLE 8F 10CM (Sheath) ×1 IMPLANT
SHIELD RADPAD INTERVENTIONAL (Supply) ×1 IMPLANT
SYSTEM PERCLOSE PROSTYLE SUTURE-MEDICATED CLOSURE-REPAIR (Closure Device) ×3 IMPLANT

## 2023-09-02 NOTE — Discharge Instructions (Addendum)
 [facility]Elect[facility] LabPatient Discharge InstructionsProcedure Date: 09/02/2023       Attending Physician: Dr. Tobie                Procedure: Electrophysiology study with ablation for atrial fibrillationRecommended diet: Low sodium, low cholesterol, low sugar diet if you are diabetic.A healthy diet is important to help you stay well.  Some health conditions require you to be on a special diet. If you have heart failure you should monitor your fluid intake and limit the amount of sodium including table salt. This will help you avoid fluid retention which can cause shortness of breath or swelling of the feet and ankles. Reading food labels is helpful when you are on a special diet.  Follow instructions from your doctor for any other special dietary requirement.Recommended activity: You may walk and climb stairs as tolerated.Do not lift, push, or pull any object greater than 10 pounds for 5 days. You may gradually return to full activity within one week.   No sexual activity for 5 days.Driving:Do not drive for 2 days following the procedure. Tenderness in the groin area could slow your reflexes and cause an accident and you are at higher risk of bleeding from the site.Wound Care: You may shower the day after the procedure.Do not submerge your site in water  for 5 days (no tub baths, swimming, or hot tubs).Clean the area daily with soap, allowing the water  to run over the puncture site. Gently pat dry.It is not necessary, but may be more comfortable to keep a band-aid over the site for a few days, if desired.  Normal:Lump at site. This can be pea/walnut sized. It will go away 1-2 weeks.Bruising at site. Should be soft and may extend down thigh . It will go away 1-2 weeksSoreness. This will improve. Not Normal:Sudden bright red bleeding or new firmness at the puncture site. If this occurs, apply firm pressure to the site and call 911.            Within 24 hours of hospital discharge:If you experience a procedure-related issue including any signs of infection (redness, swelling, drainage, warmth to the touch over the puncture site, severe bruising in groin area, or fever), call the electrophysiology doctor on call at [phone].  24 hours after hospital dischargeIf you experience non-procedural chest pain, shortness of breath, or other problems after 24 hours of hospital discharge call Ty Laneta MATSU, MD at [phone]. If you have a procedure-related issue after 24 hrs of your procedure, please contact Dr. Tobie at [phone].Smoking:Smoking can increase your chances of developing chronic health problems or worsen conditions you already have.  If you smoke you should try to cut down or quit.  Medications to help you quit are available.  Ask your doctor if you would like to receive these medications.Medications:Please refer to your medication reconciliation form and take your medications exactly as prescribed. You may take acetaminophen  (Tylenol ) as needed for pain. Resume your Eliquis  tonight at 6pm.Daily Weight For some conditions (such as heart failure) you will need to weigh yourself every day at the same time on the same scale, preferably after you empty your bladder. If you have an increase of 3 pounds in 2 days or 5 pounds in 1 week you should contact your physician. A daily written log of your weights is a good way to keep track. You should follow your doctor's recommendations on monitoring your weight.Follow-upYou have a follow-up appointment with Dr. Tobie on 10/8 at 10:30am.Provider Signature: Tawni Snell, NP

## 2023-09-02 NOTE — Anesthesia Procedure Notes (Signed)
---------------------------------------------------------------------------------------------------------------------------------------  AIRWAY GENERAL INFORMATION AND STAFF  Patient location during procedure: OR     Date of Procedure: 09/02/2023 10:14 AMCONDITION PRIOR TO MANIPULATION   Current Airway/Neck Condition:  Normal      For more airway physical exam details, see Anesthesia PreOp EvaluationAIRWAY METHOD   Patient Position:  Sniffing  Preoxygenated: yes    Maintained In-Line Stability: yes        To see details of medications used, see MAR  Induction: IV  Mask Difficulty Assessment:  1 - vent by mask    Mask NMB: 1 - vent by mask    Technique Used for Successful ETT Placement:  Direct laryngoscopy  Blade Type:  Macintosh  Laryngoscope Blade/Video laryngoscope Blade Size:  3  Cormack-Lehane Classification:  Grade I - full view of glottis  Placement Verified by: capnometry    Number of Attempts at Approach:  1  Number of Other Approaches Attempted:  0FINAL AIRWAY DETAILS  Final Airway Type:  Endotracheal airway  Final Endotracheal Airway:  ETT    Cuffed: Yes  Insertion Site:  Oral  ETT Size (mm):  7.5  Distance inserted from Lips (cm):  22----------------------------------------------------------------------------------------------------------------------------------------

## 2023-09-02 NOTE — Progress Notes (Signed)
 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  SUBJECTIVE: She currently endorses lower back pain and throat pain.  She denies feeling short of breath.OBJECTIVE:Last Filed Vitals  09/02/23 1145 BP: 104/77 Pulse: 67 Resp: 20 Temp:  SpO2: 92% General: Alert, oriented, in mild distress due to positioning and throat painRespiratory: Breathing regular and non-labored, lung sounds CTA bilaterallyCardiac: S1S2, regular rate and rhythm, no murmurAssess Site: Right femoral venous site soft, no hematoma noted, dressing CDIPulses: +2 right post tib/pedalNeuro: Alert, no focal deficitsDATA:EKG: NSR 66PLAN:1. Atrial fibrillation s/p PVI, posterior wall isolation, and SVC by PFA-Presently in NSR 70's on telemetry-Right femoral venous site stable-Bedrest x 2 hrs due to perclose used-Tylenol  1g given for pain; cepacol lozenge PRN throat pain-Continue Toprol  XL 150mg  daily-Resume Eliquis  5mg  BID at 1800 tonight CHA2DS2-VASc Score: 3 (gender, CHF, DM)2. Hx of HLD-Continue atorvastatin  40mg  daily3. Hx of Diabetes Mellitus, type 2-BG 164 post procedure-Continue Mounjaro  injections 4. 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 Hx of anxiety/depression-Continue escitalopram  10mg  daily6. Hx of hypothyroidism-Continue levothyroxine  50mcg daily-Plan for discharge to home later this afternoon-Follow-up with Dr. Tobie as scheduled on 10/8

## 2023-09-02 NOTE — H&P (Signed)
 Pre-EP Procedure NoteSubjective: Patient with a history of symptomatic afib who is referred for Atrial Fibrillation Ablation. Past Medical History[1]Tobacco Use History[2]Allergies:  Allergies[3]Prior to Admission Medications:Medications Prior to Admission Medication Sig  MOUNJARO  7.5 MG/0.5ML pen ADMINISTER 7.5 MG UNDER THE SKIN EVERY 7 DAYS  furosemide  (LASIX ) 20 mg tablet Take 1 tablet (20 mg total) by mouth every morning.  escitalopram  (LEXAPRO ) 10 mg tablet TAKE 1 TABLET(10 MG) BY MOUTH DAILY  ondansetron  (ZOFRAN -ODT) 4 MG disintegrating tablet Take 1 tablet (4 mg total) by mouth 3 times daily as needed. Place on top of tongue.  metoprolol  succinate ER (TOPROL -XL) 100 mg 24 hr tablet Take 1 tablet (100 mg total) by mouth daily for High Blood Pressure. Do not crush or chew. May be divided. (Patient taking differently: Take 1.5 tablets (150 mg total) by mouth daily for High Blood Pressure. Do not crush or chew. May be divided.)  ELIQUIS  5 MG tablet TAKE 1 TABLET(5 MG) BY MOUTH EVERY 12 HOURS  acyclovir  (ZOVIRAX ) 400 mg tablet Take 1 tablet (400 mg total) by mouth 2 times daily for Infection caused by the Varicella Zoster Virus.  albuterol  HFA (PROVENTIL , VENTOLIN ) 108 (90 Base) MCG/ACT inhaler Inhale 2 puffs into the lungs every 6 hours as needed. (Patient not taking: Reported on 08/28/2023)  nystatin -triamcinolone  (MYCOLOG II) cream Apply topically 2 times daily as needed for Skin Infection due to Candida Yeast. Apply to affected area. Do not apply on or near surgical site.  Lidocaine -Menthol  (NERVIVE ROLL-ON EX) Apply 1 g topically as needed. For toe pain  levothyroxine  (SYNTHROID , LEVOTHROID) 50 mcg tablet TAKE 1 TABLET BY MOUTH EVERY DAY  atorvastatin  (LIPITOR) 40 mg tablet TAKE 1 TABLET BY MOUTH IN THE EVENING  Menthol , Topical Analgesic, (ICY HOT EX) Apply 1 applicator topically as needed (pain).  Menthol , Topical Analgesic, (BIOFREEZE EX) Apply 1  applicator topically as needed (pain). Active Hospital Medications:No current facility-administered medications for this encounter. Objective: Vitals:Blood pressure 115/88, pulse (!) 112, temperature 35.5 C (95.9 F), temperature source Temporal, resp. rate 18, height 1.676 m (5' 5.98), weight 112.9 kg (249 lb), SpO2 99%.Vitals in last 24 ymd:Ejupzwu Vitals for the past 24 hrs: BP Temp Temp src Pulse Resp SpO2 Height Weight 09/02/23 0851 115/88 35.5 C (95.9 F) TEMPORAL (!) 112 18 99 % 1.676 m (5' 5.98) 112.9 kg (249 lb)   Physical ExamGEN - AO, NADAIRWAY VISIBILITY - soft palateNECK - JVD not elevatedPULM - BS bil, no w/r/s/c/acc muscle useC/V - RR, S1, S2, No m/r/gABD - NABS, soft, nt/ndEXT - No LE edemaNM - Answering questions appropriately, sits up independentlyPulses: L radial   R radial  L Femoral   R Femoral  L posterior tibial   R posterior tibial  L dorsalis pedis   R dorsalis pedis  Lab ReviewLab Results Component Value Date  NA 142 08/19/2023  K 5.1 08/19/2023  CL 103 08/19/2023  CO2 24 08/19/2023  UN 21 (H) 08/19/2023  CREAT 0.92 08/19/2023  CREAT 0.87 07/03/2016  CA 9.7 08/19/2023  GLU 76 08/19/2023  WBC 6.9 08/19/2023  HCT 44 08/19/2023  HGB 13.6 08/19/2023  MCV 90 08/19/2023  PLT 283 08/19/2023  GFRB 92 11/05/2019  GFRC 80 11/05/2019  PTT 32.9 11/23/2022  INR 1.2 (H) 11/23/2022  PTI 13.4 (H) 11/23/2022 Anesthesiologist's Physical Status Rating of the Patient:  Class II: Mild Systemic DiseasePlan for Sedation:  General Anesthesia.  I am evaluating the patient immediately prior to admission of sedation medication.  The plan for sedation remains appropriate.Assessment: Carly  CHRISTELLA Higgins is a 63 y.o. female with symptomatic afib.  Indications for procedure:  palpitationsPlan: Proceed with Atrial Fibrillation AblationAuthor:  Elsie Magnuson, DO  on 09/02/2023 at  9:19 AM 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  Conditioned w/ Busulfan  130 mg/m2/d and Fludarabine  40 mg/m2/d x4 days followed by 10/10 HLA MUD (F) Allogeneic PBSCT (pt/donor: ABO: A+/A+, CMV N/N)  Pulmonary edema   s/p L THA 10/20/21 10/20/2021  Shortness of breath   Tobacco use  [2] Social HistoryTobacco Use Smoking Status Former  Packs/day: 0.25  Years: 31.00  Additional pack years: 0.00  Total pack years: 7.75  Types: Cigarettes  Quit date: 11/21/2022  Years since quitting: 0.7  Passive exposure: Past Smokeless Tobacco Never Tobacco Comments  Quit for surgery, states has a couple a day now since surgery [3] No Known Allergies (drug, envir, food or latex)

## 2023-09-02 NOTE — Progress Notes (Signed)
 Report Given PACU RN Procedure SummaryProcedure: A-Fib ablationBaseline Neuro Status: A&Ox4Intervention/Results: Ablation performed successfully, see provider noteAccess: R fem vein x2: 74F, 9FHemostasis Type and Time: Perclose, 2hrsSedation Type and Amt: Per anesthesiaAdditional Meds in Procedure:  Per anesthesiaActive Issues/Relevant EventsPost Procedure Neuro Status: Phase I RecoveryIn Lab Complications: NoneConcerns: NonePost Procedure Hydration: n/aTo Do ListPost sedation monitoringBedrest: 2hrsDevice Interrogation: n/aAnticipatory Guidance/Discharge PlanningD/c home

## 2023-09-02 NOTE — Anesthesia Case Conclusion (Signed)
 CASE CONCLUSION  Emergence  Actions:  Suctioned, OPA and extubated  Criteria Used for Airway Removal:  Adequate Tv & RR and acceptable O2 saturation  Assessment:  Routine  Transport  Directly to: PACU  Airway:  Facemask  Oxygen Delivery:  10 lpm  Position:  Supine  Patient Condition on Handoff  Level of Consciousness:  Mildly sedated  Patient Condition:  Stable  Handoff Report to:  RN

## 2023-09-02 NOTE — Anesthesia Postprocedure Evaluation (Signed)
 Anesthesia Post-Op NotePatient: Carly HERO TaylorProcedure(s) Performed:Procedure SummaryDate:09/02/2023 Anesthesia Start: 09/02/2023  9:51 AM Anesthesia Stop: 09/02/2023 11:29 AM Room / Location:[facility] EP LAB 1 / [facility] EP LABS Procedure(s):Ablation - A-Fib Diagnosis:Atrial Fibrillation Surgeon(s):Grigg, Elsie Bail, Layla SQUIBB, MD Responsible Anesthesia Provider:Cari Vandeberg, MD Recovery VitalsBP: 115/88 (09/02/2023  8:51 AM)Heart Rate: (!) 112 (09/02/2023  8:51 AM)Resp: 18 (09/02/2023  8:51 AM)Temp: 35.5 C (95.9 F) (09/02/2023  8:51 AM)SpO2: 99 % (09/02/2023  8:51 AM)0-10  Pain Scale: 5 (5/10 back pain. 9/10 throat) (09/02/2023 11:50 AM)Anesthesia type:generalComplications Noted During Procedure or in PACU:None Comment:  Patient Location:PACULevel of Consciousness:  Recovered to baselinePatient Participation:   Able to participateTemperature Status:  NormothermicOxygen Saturation:  Within patient's normal rangeCardiac Status: within patient's normal rangeFluid Status:  StableAirway Patency:   YesPulmonary Status:  BaselinePain Management:  Adequate analgesiaNausea and Vomiting:None  Post Op Assessment:  Tolerated procedure wellResponsible Anesthesia Provider Attestation:All indicated post anesthesia care provided -

## 2023-09-02 NOTE — Progress Notes (Signed)
 Assumed care of pt s/p A-fib Ablation. VS maintained within ordered parameters for duration of anesthesia recovery, telemetry showing NSR. Pt reporting pain in her back and throat, PRN Tylenol  administered with positive effect. Pt advanced to Phase 2 recovery per Dr. Campbell, attending anesthesiologist. Right groin access sites benign throughout recovery (soft on palpation, non-tender, without bleeding/hematoma). Distal pulses palpable to RLE. Two hours bedrest completed. Pt endorsing nausea while ambulating following bedrest, IV zofran  administered per order and PO ginger ale provided per pt request. Nausea resolved following these interventions. Right groin sites benign following ambulation.  Pt cleared for discharge to home. Discharge instructions (AVS) reviewed with pt and pts mother. Both parties voiced understanding of discharge instructions. PIV removed. Discharged to home in stable condition, ride provided by mother.  Laneta Dawn, RN

## 2023-09-02 NOTE — INTERIM OP NOTE (Signed)
 Interim Op Note (Surgical Log ID: [id number])   Date of Surgery: 09/02/2023   Surgeons: Surgeons and Role:   * [name] SQUIBB, MD - Primary   * Nickolas Fallow, DO - Fellow Assistants:     Pre-op Diagnosis: Pre-Op Diagnosis Codes:    * Atrial fibrillation, unspecified type [I48.91]   Post-op Diagnosis: Post-Op Diagnosis Codes:   * Atrial fibrillation, unspecified type [I48.91]   Procedure(s) Performed: Procedure(s) (LRB):Ablation - A-Fib (N/A)   Anesthesia Type: General      Fluid Totals: I/O this shift:08/25 0700 - 08/25 1459In: 900 (8 mL/kg) [I.V.:900]Out: - (0 mL/kg) Net: 900Weight: 112.9 kg    Estimated Blood Loss: * No values recorded between 09/02/2023  9:51 AM and 09/02/2023 11:19 AM *   Specimens to Pathology:  * No specimens in log *   Temporary Implants:    Packing:           Patient Condition: good   Findings (Including unexpected complications): S/P PFA afib ablation (PVI,PWI,SVC)No complications or unexpected procedural issues. Converted to SR during ablationPostop care per routinePerclose Closure2 hours bed restNo med changesResume DOAC with evening dose. Signed:  Saaya Procell P. Tobie, MD  on 09/02/2023 at 11:19 AM

## 2023-09-03 LAB — EKG 12-LEAD
P: -71 deg
PR: 206 ms
QRS: 99 deg
QRSD: 82 ms
QT: 450 ms
QTc: 471 ms
Rate: 66 {beats}/min
T: 134 deg

## 2023-09-04 ENCOUNTER — Encounter: Payer: Self-pay | Admitting: Cardiology

## 2023-09-04 NOTE — Telephone Encounter (Signed)
 Please advise, thank you.

## 2023-09-04 NOTE — Telephone Encounter (Signed)
 FYI, thank you.

## 2023-09-07 ENCOUNTER — Other Ambulatory Visit: Payer: Self-pay

## 2023-09-07 ENCOUNTER — Encounter: Payer: Self-pay | Admitting: Emergency Medicine

## 2023-09-07 ENCOUNTER — Ambulatory Visit: Attending: Emergency Medicine | Admitting: Emergency Medicine

## 2023-09-07 VITALS — BP 115/80 | HR 114 | Temp 96.5°F | Resp 20

## 2023-09-07 DIAGNOSIS — J029 Acute pharyngitis, unspecified: Secondary | ICD-10-CM | POA: Insufficient documentation

## 2023-09-07 DIAGNOSIS — I517 Cardiomegaly: Secondary | ICD-10-CM | POA: Insufficient documentation

## 2023-09-07 DIAGNOSIS — I4892 Unspecified atrial flutter: Secondary | ICD-10-CM | POA: Insufficient documentation

## 2023-09-07 DIAGNOSIS — R9431 Abnormal electrocardiogram [ECG] [EKG]: Secondary | ICD-10-CM | POA: Insufficient documentation

## 2023-09-07 LAB — EKG 12-LEAD
QRS: 46 deg
QRSD: 84 ms
QT: 392 ms
QTc: 511 ms
Rate: 102 {beats}/min

## 2023-09-07 MED ORDER — AMIODARONE HCL 400 MG PO TABS *I*
400.0000 mg | ORAL_TABLET | Freq: Every day | ORAL | 0 refills | Status: DC
Start: 2023-09-07 — End: 2023-10-11

## 2023-09-07 NOTE — UC Provider Note (Signed)
 History Chief Complaint Patient presents with  Pharyngitis   Had a cardiac ablation on Monday and was intubated.  Since then has had a sore throat.  Thinks she has irritation from the tube.  Took ibuprofen  for pain, not supposed to take it but it helped some. 63 year old female S/P ablation for A-fib on 8/25, PMHx anxiety, ventricular tachycardia, diabetes, GERD, on Eliquis  here for sore throat.  Patient states throat has been sore since her procedure 5 days ago, overall it is slightly improving, but has not resolved.  Patient denies fevers, chills, difficulty swallowing, other cold symptoms.Patient's heart rate was noted to be elevated, upon questioning she states that 2 days ago she started her Mounjaro  injection.  After which she noted that her heart was racing, and she noted increased dyspnea on exertion.  She reports that prior to that and following the recent ablation the palpitations and shortness of breath was significantly improved.  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  Respiratory:        Dyspnea on exertion Cardiovascular:  Positive for palpitations. Physical Exam Vitals   First Recorded BP: 115/80, Resp: 20, Temp: 35.8 C (96.5 F), Temp src: Tympanic Oxygen Therapy SpO2: 99 %, Heart Rate: (!) 114, (09/07/23 1126)  .Physical ExamVitals and nursing note reviewed. Constitutional:     General: She is not in acute distress.   Appearance: Normal appearance. She is well-developed and well-groomed. She is not ill-appearing, toxic-appearing or diaphoretic. HENT:    Head: Normocephalic and atraumatic.    Nose: Nose normal.    Mouth/Throat:    Lips: Pink.    Mouth: Mucous membranes are moist.    Pharynx: Uvula midline. No uvula swelling or postnasal drip.    Tonsils: No tonsillar exudate. 1+ on the right. 1+ on the left.    Comments: Uvula is midline, tip appears white, she is managing her oral secretions and protecting her airwayCardiovascular:    Rate and Rhythm: Tachycardia present. Rhythm irregular.    Heart sounds: Normal heart sounds. Pulmonary:    Effort: Pulmonary effort is normal.    Breath sounds: Normal breath sounds. No decreased breath sounds, wheezing, rhonchi or rales. Musculoskeletal:    Right lower leg: No edema.    Left lower leg: No edema. Lymphadenopathy:  Head:    Right side of head: No submental, submandibular,  tonsillar or preauricular adenopathy.    Left side of head: No submental, submandibular, tonsillar or preauricular adenopathy.    Cervical: No cervical adenopathy. Skin:   General: Skin is warm. Neurological:    General: No focal deficit present.    Mental Status: She is alert and oriented to person, place, and time.    Gait: Gait is intact. Psychiatric:       Attention and Perception: Attention normal.       Behavior: Behavior normal. Behavior is cooperative.       Thought Content: Thought content normal.       Judgment: Judgment normal.  Medical Decision Making Medical Decision MakingAssessment:  63 year old female PMHx diabetes, GERD, anticoagulated on Eliquis , A-fib, s/p ablation on 8/25, here with sore throat.  Patient reports sore throat since her procedure.  She is managing her oral secretions and protecting her airway.  Uvula is midline, tip of uvula appears white.  No other cold symptoms.  Suspect irritation from recent procedure.  Patient also mentioned feeling palpitations and dyspnea on exertion for the past 2 days.  Patient reports that she did start taking her Mounjaro  again that day, not sure if it is related.  She was noted to be in a regular rhythm, EKG shows atrial flutter.  Her other vital signs are reassuring.  Cardiologist that performed procedure was Dr.Parag Tobie, I did call the office, on-call physician Dr. Solmon Hurst returned to call at approximately 12:30 PM.Differential diagnosis:  Strep pharyngitisUveitisGERDViral pharyngitisArrhythmiaPlan and Results:  Dr. Solmon Hurst was placed on speaker phone, spoke with patient regarding recent ablation.  At which time Dr. Hurst recommended starting amiodarone  400 mg daily.  He did briefly discuss possibility of cardioversion in the near future.  He stated he would send a message to Dr. Tobie, and have them coordinate follow-up.  Patient was advised before discharge that she should be  evaluated in the emergency department if she developed chest pain, worsening shortness of breath, lightheadedness or dizziness.EKG Interpretation:  EKG tachycardia with atrial flutter this was reviewed with Dr. Elsie Bolognese, MD.  Reviewed EKG with on-call cardiology Dr. Solmon Hurst.No ischemic changes  Independent Review of: chart/prior recordsOrders Placed This Encounter  EKG 12 lead  amiodarone  (PACERONE ) 400 MG tablet Recent Results (from the past 24 hours) EKG 12 lead  Collection Time: 09/07/23 11:51 AM Result Value Ref Range  Rate 102 bpm  QRSD 84 ms  QT 392 ms  QTc 511 ms  QRS 46 deg Final DiagnosisFinal diagnoses: [I48.92] Atrial flutter (Primary) [J02.9] Sore throat Rest, hydration, and good hand hygiene recommended.Symptom relief, warning signs and infection control reviewed.Use of relevant over the counter and already prescribed medications discussed.Please start the new medications as below:Cannot display discharge medications since this is not an admission.Please follow up with your physician as below:Discharge Instructions  None Final Diagnosis  ICD-10-CM ICD-9-CM 1. Atrial flutter  I48.92 427.32 2. Sore throat  J02.9 462 Mischa Brittingham MARLA Rams, PAAuthor:  Greig MARLA Rams, PA [1] Past Medical History:Diagnosis Date  Atrial fibrillation   fib/flutter 05/2015  Breast cancer   Carcinoma in situ of female breast 11/29/2020  CHF (congestive heart failure)   pulmonary edema 05/28/15  Diabetes mellitus 08/23/2020  Female infertility   GERD (gastroesophageal reflux disease)   Heart murmur   Hypothyroidism 06/16/2021  Hypoxia 06/11/2015  Impaired mobility and ADLs 06/11/2015  Leukocytosis 09/22/2013  Lumbar radiculopathy 01/11/2015  Osteoarthrosis 06/16/2021  PCB (post  coital bleeding)   post allogenic MUD (F)  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   Conditioned w/ Busulfan  130 mg/m2/d and Fludarabine  40 mg/m2/d x4 days followed by 10/10 HLA MUD (F) Allogeneic PBSCT (pt/donor: ABO: A+/A+, CMV N/N)  Pulmonary edema   S/P ablation of atrial fibrillation 09/02/2023  PVI, posterior wall isolation, and SVC by PFA by Dr. Tobie  s/p L THA 10/20/21 10/20/2021  Shortness of breath   Tobacco use  [2] Past Surgical History:Procedure Laterality Date  BONE MARROW TRANSPLANT  2017  Mediport insertion    PR ARTHRP ACETBLR/PROX FEM PROSTC AGRFT/ALGRFT Left 10/20/2021  Procedure: LEFT POSTERIOR ARTHROPLASTY, HIP, TOTAL;  Surgeon: Debrah Lot, MD;  Location: [facility];  Service: Orthopedics  PR ARTHRP ACETBLR/PROX FEM PROSTC AGRFT/ALGRFT Right 12/21/2022  Procedure: RIGHT POSTERIOR ARTHROPLASTY, HIP, TOTAL;  Surgeon: Debrah Lot, MD;  Location: [facility];  Service: Orthopedics  PR AXILLARY LYMPHADENECTOMY COMPLETE Left 01/24/2021  Procedure: sentinel lymph node biopsy;  Surgeon: Patterson Caldron, MD;  Location: [facility];  Service: Oncology General  PR CARDIOVERSION ELECTIVE ARRHYTHMIA EXTERNAL N/A 09/24/2022  Procedure: Cardioversion;  Surgeon: Ana Warren CROME, NP;  Location: [facility] EP LABS;  Service: Cardiovascular  PR COMPRE EP EVAL ABLTJ ATR FIB PULM VEIN ISOLATION N/A 09/02/2023  Procedure: Ablation - A-Fib;  Surgeon: [name] SQUIBB, MD;  Location: [facility] EP LABS;  Service: Cardiovascular  PR MASTECTOMY PARTIAL Left 01/24/2021  Procedure: LEFT NEEDLE LOC PARTIAL MASTECTOMY   ;  Surgeon: Patterson Caldron, MD;  Location: [facility];  Service: Oncology General  ROTATOR CUFF REPAIR Right  [3] Family HistoryProblem Relation Name Age of Onset  No Known Problems Mother    Throat cancer Father    Heart attack Father    Thyroid  disease Sister    No Known Problems Brother    Breast cancer Paternal Aunt    Ovarian cancer Neg Hx    Anesthesia problems Neg Hx   [4] Social HistoryTobacco Use  Smoking  status: Former   Packs/day: 0.25   Years: 31.00   Additional pack years: 0.00   Total pack years: 7.75   Types: Cigarettes   Quit date: 11/21/2022   Years since quitting: 0.7   Passive exposure: Past  Smokeless tobacco: Never  Tobacco comments:   Quit for surgery, states has a couple a day now since surgery Substance Use Topics  Alcohol use: Not Currently  Drug use: No

## 2023-09-07 NOTE — Patient Instructions (Signed)
 Per Dr. Solmon Hurst will start you on amiodarone , he will reach out to Dr. Tobie and schedule you for a follow-up appointment.  He did briefly discuss cardioversion with you.In the meantime if you have worsening palpitations, you become lightheaded or dizzy, shortness of breath worsens, you develop chest pain I do recommend being evaluated in the emergency departmentSore throat most likely from recent procedure, make sure that you are staying well-hydrated, you may gargle salt water .  Recommend sticking with Tylenol  for pain control

## 2023-09-10 ENCOUNTER — Other Ambulatory Visit: Payer: Self-pay | Admitting: Cardiology

## 2023-09-10 DIAGNOSIS — I4892 Unspecified atrial flutter: Secondary | ICD-10-CM

## 2023-09-10 NOTE — Telephone Encounter (Signed)
 Pt calling requesting a call back due to some concerns and questions she has in regards to recent  ablation that was done on 8/25

## 2023-09-10 NOTE — Progress Notes (Signed)
 [name], MD  P Amb Card Ep Rn; [name] SQUIBB, MDHi,I was called by the urgent care today. Carly Higgins presented with symptomatic tachycardia. She has a PFA AF ablation last week with PVI, PWA and SVC isolation. ECG consistent with atypical atrial flutter. It looks like she used to be on amiodarone . I asked the PA at the urgent care to provide Carly Higgins with a prescription for amiodarone  400 mg once daily. Can we please contact the patient this week and schedule her for cardioversion. I would think we should continue amiodarone  for 6-8 weeks but I will let Parag make that final determination.ThanksBurr

## 2023-09-10 NOTE — Telephone Encounter (Signed)
 Delpapa, Landry LABOR, NP  Brier Firebaugh, Romero CROME, RNYes, we can. Maybe take daily for a few days and see how she does. Will want repeat labs if she is on it more consistentlyThxBethMyChart to patient.     JDeans RN

## 2023-09-10 NOTE — Addendum Note (Signed)
 Addended by: SEDRICK ROMERO CROME on: 09/10/2023 04:46 PM Modules accepted: Orders

## 2023-09-10 NOTE — Telephone Encounter (Signed)
 Spoke to patient who started the amiodarone  400 mg daily on 8/30 and has not missed doses of Eliquis  since resumed night of ablation 8/25.   She has significant dyspnea and her throat remains sore form ET tube.  She is drinking adequate fluids.  Took dose of furosemide  yesterday but has no refills. Will check with APP if OK to refill.Cardioversion scheduled for Wed 9/17 if she does not convert.Lab orders entered.Instructions reviewed and sent via MyChart.Reduce amiodarone  to 200 mg daily after 1 month.  Advised monitor heart rate and call if self converts.Patient expresses understanding and is agreeable to this plan. JDeans RN

## 2023-09-10 NOTE — Telephone Encounter (Signed)
 [name] SQUIBB, MD  [name], MD; P Amb Card Ep [place] with plan for cardioversion next available and amiodarone  200 mg twice a day for 1 month, then amiodarone  200 mg once a day.LMTCB  Amiodarone  and Cardioversion after 9/16                     JDeans RN

## 2023-09-11 ENCOUNTER — Other Ambulatory Visit: Payer: Self-pay | Admitting: Cardiology

## 2023-09-12 ENCOUNTER — Other Ambulatory Visit: Payer: Self-pay

## 2023-09-12 ENCOUNTER — Ambulatory Visit: Payer: Self-pay | Admitting: Internal Medicine

## 2023-09-12 VITALS — BP 120/84 | HR 84 | Wt 246.0 lb

## 2023-09-12 DIAGNOSIS — E119 Type 2 diabetes mellitus without complications: Secondary | ICD-10-CM

## 2023-09-12 DIAGNOSIS — F4323 Adjustment disorder with mixed anxiety and depressed mood: Secondary | ICD-10-CM

## 2023-09-12 DIAGNOSIS — E669 Obesity, unspecified: Secondary | ICD-10-CM

## 2023-09-12 DIAGNOSIS — I4892 Unspecified atrial flutter: Secondary | ICD-10-CM

## 2023-09-12 MED ORDER — FUROSEMIDE 20 MG PO TABS *I*
20.0000 mg | ORAL_TABLET | Freq: Every day | ORAL | 0 refills | Status: DC | PRN
Start: 2023-09-12 — End: 2023-09-12

## 2023-09-12 MED ORDER — FUROSEMIDE 20 MG PO TABS *I*
20.0000 mg | ORAL_TABLET | Freq: Every day | ORAL | 0 refills | Status: DC | PRN
Start: 2023-09-12 — End: 2023-10-11

## 2023-09-12 NOTE — Patient Instructions (Addendum)
 Ok to hold mounjaro  for now to see if that helps with bloating; Watch diet to avoid sweets, limit carbs, avoid salt; follow up with cards as planned; R/N lasix  20 mg as needed - rec checking weights twice daily; no other med changes; RTO 6 mos for PE

## 2023-09-12 NOTE — Progress Notes (Signed)
 [facility] ConnectSubjective  Carly Higgins, May 22, 1960, had concerns including Follow-up (Still having lots of shortness of breath today doing better /Had ginger tea this morning which is supposed to help/).Went to [facility] Saturday for ST from ET tube; Still feels palpitations. Still feeling a lot of upper GI bloating - makes it hard to breathe. Took a neighbors water  pills as they didn't renew those from  D/C. Is on mounjaro  still - wondering if she should hold that. Is going for cardioversion in later September as she is still bouncing in / out of AF. Review of Systems Constitutional: Negative.  Cardiovascular:  Positive for palpitations. Gastrointestinal:       Epigastric / upper abdom pressure - improved after lasix  Genitourinary: Negative.  Neurological:  Negative for dizziness, weakness and headaches. Psychiatric/Behavioral:  Positive for depression (no SI/HI - frustrated with lingering AF sx after ablation procedure as well as recovery).   Objective Physical ExamVitals reviewed. Constitutional:     General: She is not in acute distress.   Appearance: She is obese. She is not ill-appearing. Cardiovascular:    Rate and Rhythm: Normal rate and regular rhythm.    Heart sounds: Normal heart sounds. Pulmonary:    Effort: Pulmonary effort is normal.    Breath sounds: Normal breath sounds. Abdominal:    Palpations: Abdomen is soft.    Tenderness: There is no abdominal tenderness. Musculoskeletal:    Cervical back: Neck supple. No tenderness.    Right lower leg: No edema.    Left lower leg: No edema. Lymphadenopathy:    Cervical: No cervical adenopathy. Skin:   General: Skin is warm and dry. Neurological:    Mental Status: She is alert and oriented to person, place, and time.    Gait: Gait normal. Vitals Blood pressure 120/84, pulse 84, weight 111.6 kg (246 lb), SpO2  97%.Assessment & Plan 1. Atrial flutter (Primary)H/o AF - had ablation procedure but still sx - - furosemide  (LASIX ) 20 mg tablet; Take 1 tablet (20 mg total) by mouth daily as needed.  Dispense: 30 tablet; Refill: 02. Adjustment disorder with mixed anxiety and depressed moodH/o anxiety and depression - stable but feeling more depressed D/T all of what she has been through3. ObesityWeight is largely unchanged4. Diabetes mellitusDM - stable  Author: Eunice Winecoff J Corlis Angelica, NP  Note signed: 09/12/2023

## 2023-09-14 LAB — EKG 12-LEAD
QRS: 76 deg
QRSD: 73 ms
QT: 310 ms
QTc: 424 ms
Rate: 112 {beats}/min
T: 108 deg

## 2023-09-16 ENCOUNTER — Telehealth: Payer: Self-pay | Admitting: Internal Medicine

## 2023-09-16 NOTE — Telephone Encounter (Addendum)
 Pt here last week for in heart rate-doing Ok and now back again.Asking to have Stress Test ordered.With her family history wants to make sure all is [place] does see Cards on 9/17.Because she had an ablation procedure done, I would have her call cards an dsee what they think or if they want to  move her appt up. I think ordering a stress test is up to cardiology. Thanks PD

## 2023-09-18 ENCOUNTER — Ambulatory Visit
Admission: RE | Admit: 2023-09-18 | Discharge: 2023-09-18 | Disposition: A | Discharge status: Home / Self Care | Payer: Medicare Other | Source: Ambulatory Visit | Attending: General Surgery | Admitting: General Surgery

## 2023-09-18 ENCOUNTER — Other Ambulatory Visit: Payer: Self-pay | Admitting: Hematology

## 2023-09-18 ENCOUNTER — Other Ambulatory Visit: Payer: Self-pay

## 2023-09-18 ENCOUNTER — Encounter: Payer: Self-pay | Admitting: Hematology

## 2023-09-18 ENCOUNTER — Other Ambulatory Visit
Admission: RE | Admit: 2023-09-18 | Discharge: 2023-09-18 | Disposition: A | Discharge status: Home / Self Care | Source: Ambulatory Visit | Attending: Hematology | Admitting: Hematology

## 2023-09-18 DIAGNOSIS — C50412 Malignant neoplasm of upper-outer quadrant of left female breast: Secondary | ICD-10-CM

## 2023-09-18 DIAGNOSIS — I4891 Unspecified atrial fibrillation: Secondary | ICD-10-CM

## 2023-09-18 DIAGNOSIS — D471 Chronic myeloproliferative disease: Secondary | ICD-10-CM

## 2023-09-18 DIAGNOSIS — R92323 Mammographic fibroglandular density, bilateral breasts: Secondary | ICD-10-CM

## 2023-09-18 DIAGNOSIS — Z8249 Family history of ischemic heart disease and other diseases of the circulatory system: Secondary | ICD-10-CM

## 2023-09-18 DIAGNOSIS — I4892 Unspecified atrial flutter: Secondary | ICD-10-CM | POA: Insufficient documentation

## 2023-09-18 DIAGNOSIS — Z853 Personal history of malignant neoplasm of breast: Secondary | ICD-10-CM | POA: Insufficient documentation

## 2023-09-18 DIAGNOSIS — Z1231 Encounter for screening mammogram for malignant neoplasm of breast: Secondary | ICD-10-CM | POA: Insufficient documentation

## 2023-09-18 DIAGNOSIS — Z803 Family history of malignant neoplasm of breast: Secondary | ICD-10-CM

## 2023-09-18 LAB — COMPREHENSIVE METABOLIC PANEL
ALT: 127 U/L — ABNORMAL HIGH (ref 0–35)
AST: 135 U/L — ABNORMAL HIGH (ref 0–35)
Albumin: 4.1 g/dL (ref 3.5–5.2)
Alk Phos: 109 U/L — ABNORMAL HIGH (ref 35–105)
Anion Gap: 14 (ref 7–16)
Bilirubin,Total: 1.5 mg/dL — ABNORMAL HIGH (ref 0.0–1.2)
CO2: 24 mmol/L (ref 20–28)
Calcium: 10.3 mg/dL — ABNORMAL HIGH (ref 8.6–10.2)
Chloride: 101 mmol/L (ref 96–108)
Creatinine: 1.13 mg/dL — ABNORMAL HIGH (ref 0.51–0.95)
Glucose: 115 mg/dL — ABNORMAL HIGH (ref 60–99)
Lab: 24 mg/dL — ABNORMAL HIGH (ref 6–20)
Potassium: 5.2 mmol/L — ABNORMAL HIGH (ref 3.3–5.1)
Sodium: 139 mmol/L (ref 133–145)
Total Protein: 5.9 g/dL — ABNORMAL LOW (ref 6.3–7.7)
eGFR BY CREAT: 54 — AB

## 2023-09-18 LAB — CBC AND DIFFERENTIAL
Baso # K/uL: 0.1 THOU/uL (ref 0.0–0.2)
Eos # K/uL: 0.1 THOU/uL (ref 0.0–0.5)
Hematocrit: 46 % (ref 34–49)
Hemoglobin: 13.7 g/dL (ref 11.2–16.0)
IMM Granulocytes #: 0 THOU/uL
IMM Granulocytes: 0.4 %
Lymph # K/uL: 1.6 THOU/uL (ref 1.0–5.0)
MCV: 93 fL (ref 75–100)
Mono # K/uL: 0.8 THOU/uL (ref 0.1–1.0)
Neut # K/uL: 4.2 THOU/uL (ref 1.5–6.5)
Platelets: 294 THOU/uL (ref 150–450)
RBC: 5 MIL/uL (ref 4.0–5.5)
RDW: 18.4 % — ABNORMAL HIGH (ref 0.0–15.0)
Seg Neut %: 62 %
WBC: 6.7 THOU/uL (ref 3.5–11.0)

## 2023-09-18 LAB — MAGNESIUM: Magnesium: 1.9 mg/dL (ref 1.6–2.5)

## 2023-09-18 LAB — LACTATE DEHYDROGENASE: LD: 320 U/L — ABNORMAL HIGH (ref 118–225)

## 2023-09-18 LAB — NEUTROPHIL #-INSTRUMENT: Neutrophil #-Instrument: 4.2 THOU/uL

## 2023-09-18 NOTE — Progress Notes (Signed)
 Patel, Parag P, MD  P Amb Card Ep RnWe can schedule nuc stress test next available.MyChart response to patent with number to call to schedule.  Order placed.   JDeans RN

## 2023-09-19 ENCOUNTER — Ambulatory Visit: Payer: Self-pay | Admitting: Internal Medicine

## 2023-09-19 ENCOUNTER — Other Ambulatory Visit: Payer: Self-pay

## 2023-09-19 VITALS — BP 122/82 | HR 99 | Wt 244.0 lb

## 2023-09-19 DIAGNOSIS — E669 Obesity, unspecified: Secondary | ICD-10-CM

## 2023-09-19 DIAGNOSIS — E119 Type 2 diabetes mellitus without complications: Secondary | ICD-10-CM

## 2023-09-19 DIAGNOSIS — I4891 Unspecified atrial fibrillation: Secondary | ICD-10-CM

## 2023-09-19 DIAGNOSIS — D849 Immunodeficiency, unspecified: Secondary | ICD-10-CM

## 2023-09-19 NOTE — Telephone Encounter (Signed)
 Spoke to pt- appt made today to see Pam.

## 2023-09-19 NOTE — Progress Notes (Signed)
 [facility] ConnectSubjective Carly Higgins, [birth year], had no chief complaint listed for this encounter.Still feeling bloated. Used water  pill last night and feels better this morning but is starting to come back now; Is out of breath. No wheezing. Mild cough. Has phlegm but can't raise - has had since intubation for ablation; Is likely going to ned another ablation procedure. Has stress echo sched and folllow up with cards is plannedReview of Systems Constitutional: Negative.  Respiratory:  Positive for shortness of breath (D/T swelling /bloating in abdomen).  Cardiovascular:  Negative for chest pain, palpitations and leg swelling.      Occas notices rapid HR and c/o swelling / bloating in her abdom Gastrointestinal:  Negative for abdominal pain, constipation, diarrhea, heartburn, nausea and vomiting. Neurological: Negative.  Psychiatric/Behavioral: Negative.    Objective Physical ExamVitals reviewed. Constitutional:     Appearance: She is obese. Cardiovascular:    Rate and Rhythm: Normal rate and regular rhythm.    Heart sounds: Normal heart sounds. Pulmonary:    Effort: Pulmonary effort is normal.    Breath sounds: Normal breath sounds. Abdominal:    General: Bowel sounds are normal.    Palpations: Abdomen is soft.    Tenderness: There is no abdominal tenderness. There is no guarding or rebound.    Comments: Abdom soft; placed on R side and abdom appears fluid-filled with light tapping.  Musculoskeletal:    Cervical back: Neck supple. No tenderness.    Right lower leg: No edema.    Left lower leg: No edema. Lymphadenopathy:    Cervical: No cervical adenopathy. Skin:   General: Skin is warm and dry. Neurological:    Mental Status: She is alert and oriented to person, place, and time.    Motor: No weakness.    Gait: Gait normal. Psychiatric:       Mood and Affect: Mood  normal. Vitals Vitals:  09/19/23 0918 BP: 122/82 BP Location: Right arm Patient Position: Sitting Pulse: 99 SpO2: 97% Weight: 110.7 kg (244 lb) Assessment & Plan Atrial fibrillation (Primary)Ptis back into AF despite ablation procedure - abdom bloating and discomfort is likely from fluid build up under her lungs - getting some mild relief with low dose lasixObesityNo significant changeImmunocompromisedBaseline - no changesDiabetes mellitusStable - no changes today Author: SHARLET JINNY NAVAL, NP  Note signed: 09/19/2023

## 2023-09-19 NOTE — Patient Instructions (Addendum)
 Take lasix  today - can take 2 tabs at once or split to second dose a few hours later; keep up with weights; take extra 50 mg metoprolol  when you arrive home and take your usual dose later this evening; follow up with cards as planned

## 2023-09-23 ENCOUNTER — Other Ambulatory Visit: Payer: Self-pay | Admitting: General Surgery

## 2023-09-23 ENCOUNTER — Ambulatory Visit: Payer: Self-pay | Admitting: General Surgery

## 2023-09-23 DIAGNOSIS — Z171 Estrogen receptor negative status [ER-]: Secondary | ICD-10-CM

## 2023-09-24 ENCOUNTER — Other Ambulatory Visit
Admission: RE | Admit: 2023-09-24 | Discharge: 2023-09-24 | Disposition: A | Discharge status: Home / Self Care | Source: Ambulatory Visit | Attending: Hematology | Admitting: Hematology

## 2023-09-24 DIAGNOSIS — D471 Chronic myeloproliferative disease: Secondary | ICD-10-CM | POA: Insufficient documentation

## 2023-09-24 LAB — COMPREHENSIVE METABOLIC PANEL
ALT: 63 U/L — ABNORMAL HIGH (ref 0–35)
AST: 42 U/L — ABNORMAL HIGH (ref 0–35)
Albumin: 4.1 g/dL (ref 3.5–5.2)
Alk Phos: 92 U/L (ref 35–105)
Anion Gap: 13 (ref 7–16)
Bilirubin,Total: 1.1 mg/dL (ref 0.0–1.2)
CO2: 27 mmol/L (ref 20–28)
Calcium: 9.7 mg/dL (ref 8.6–10.2)
Chloride: 100 mmol/L (ref 96–108)
Creatinine: 1.16 mg/dL — ABNORMAL HIGH (ref 0.51–0.95)
Glucose: 125 mg/dL — ABNORMAL HIGH (ref 60–99)
Lab: 27 mg/dL — ABNORMAL HIGH (ref 6–20)
Potassium: 5.2 mmol/L — ABNORMAL HIGH (ref 3.3–5.1)
Sodium: 140 mmol/L (ref 133–145)
Total Protein: 5.9 g/dL — ABNORMAL LOW (ref 6.3–7.7)
eGFR BY CREAT: 53 — AB

## 2023-09-25 ENCOUNTER — Ambulatory Visit
Admission: RE | Admit: 2023-09-25 | Discharge: 2023-09-25 | Disposition: A | Discharge status: Home / Self Care | Source: Ambulatory Visit | Attending: Cardiology | Admitting: Cardiology

## 2023-09-25 ENCOUNTER — Ambulatory Visit: Payer: Self-pay | Admitting: Registered Nurse

## 2023-09-25 ENCOUNTER — Telehealth: Payer: Self-pay | Admitting: Cardiology

## 2023-09-25 ENCOUNTER — Other Ambulatory Visit: Payer: Self-pay

## 2023-09-25 ENCOUNTER — Ambulatory Visit: Payer: Self-pay

## 2023-09-25 ENCOUNTER — Emergency Department
Admission: EM | Admit: 2023-09-25 | Discharge: 2023-09-25 | Discharge status: Against Medical Advice | Source: Ambulatory Visit | Admitting: Emergency Medicine

## 2023-09-25 ENCOUNTER — Ambulatory Visit: Payer: Self-pay | Admitting: Internal Medicine

## 2023-09-25 ENCOUNTER — Encounter
Admission: RE | Disposition: A | Discharge status: Home / Self Care | Payer: Self-pay | Source: Ambulatory Visit | Attending: Cardiology

## 2023-09-25 DIAGNOSIS — Z7901 Long term (current) use of anticoagulants: Secondary | ICD-10-CM | POA: Insufficient documentation

## 2023-09-25 DIAGNOSIS — I4892 Unspecified atrial flutter: Secondary | ICD-10-CM | POA: Insufficient documentation

## 2023-09-25 DIAGNOSIS — I4819 Other persistent atrial fibrillation: Secondary | ICD-10-CM | POA: Insufficient documentation

## 2023-09-25 DIAGNOSIS — E119 Type 2 diabetes mellitus without complications: Secondary | ICD-10-CM | POA: Insufficient documentation

## 2023-09-25 DIAGNOSIS — Z5321 Procedure and treatment not carried out due to patient leaving prior to being seen by health care provider: Secondary | ICD-10-CM | POA: Insufficient documentation

## 2023-09-25 DIAGNOSIS — I4891 Unspecified atrial fibrillation: Secondary | ICD-10-CM | POA: Diagnosis present

## 2023-09-25 HISTORY — PX: PR CARDIOVERSION ELECTIVE ARRHYTHMIA EXTERNAL: 92960

## 2023-09-25 LAB — EKG 12-LEAD
P: 48 deg
PR: 219 ms
QRS: 99 deg
QRS: 102 deg
QRSD: 84 ms
QRSD: 85 ms
QT: 433 ms
QTc: 572 ms
QTc: INVALID ms
Rate: 105 {beats}/min
Rate: 69 {beats}/min
T: 167 deg
T: 212 deg

## 2023-09-25 SURGERY — CARDIOVERSION
Anesthesia: Monitor Anesthesia Care

## 2023-09-25 MED ORDER — PROPOFOL 10 MG/ML IV EMUL (INTERMITTENT DOSING) WRAPPED *I*
INTRAVENOUS | Status: DC | PRN
Start: 2023-09-25 — End: 2023-09-25
  Administered 2023-09-25: 60 mg via INTRAVENOUS

## 2023-09-25 MED ORDER — SODIUM CHLORIDE 0.9 % INJ (FLUSH) WRAPPED (FOR OSM ONLY) *I*
Status: DC | PRN
Start: 2023-09-25 — End: 2023-09-25
  Administered 2023-09-25: 5 mL via INTRAVENOUS

## 2023-09-25 MED ORDER — SODIUM CHLORIDE 0.9 % FLUSH FOR PUMPS *I*
0.0000 mL/h | INTRAVENOUS | Status: DC | PRN
Start: 2023-09-25 — End: 2023-11-24

## 2023-09-25 MED ORDER — DEXTROSE 5 % FLUSH FOR PUMPS *I*
0.0000 mL/h | INTRAVENOUS | Status: DC | PRN
Start: 2023-09-25 — End: 2023-11-24

## 2023-09-25 SURGICAL SUPPLY — 1 items: PAD ELECTRODE DEFIB POST LG (Supply) ×1 IMPLANT

## 2023-09-25 NOTE — Preop H&P (Signed)
 Pre-EP Procedure NoteSubjective: 63 year old female with a history of DM, and atrial fibrillation (s/p PFA PVI, PWI, SVC ablation) who is referred for Cardioversion.  Denies any missed doses of Eliquis  in the last 3 weeks. Past Medical History[1]Tobacco Use History[2]Allergies:  Allergies[3]Prior to Admission Medications:Medications Prior to Admission Medication Sig  furosemide  (LASIX ) 20 mg tablet Take 1 tablet (20 mg total) by mouth daily as needed.  amiodarone  (PACERONE ) 400 MG tablet Take 1 tablet (400 mg total) by mouth daily for Atrial Flutter.  metoprolol  succinate ER (TOPROL -XL) 100 mg 24 hr tablet Take 1.5 tablets (150 mg total) by mouth daily for High Blood Pressure. Do not crush or chew. May be divided.  MOUNJARO  7.5 MG/0.5ML pen ADMINISTER 7.5 MG UNDER THE SKIN EVERY 7 DAYS  escitalopram  (LEXAPRO ) 10 mg tablet TAKE 1 TABLET(10 MG) BY MOUTH DAILY  ELIQUIS  5 MG tablet TAKE 1 TABLET(5 MG) BY MOUTH EVERY 12 HOURS  levothyroxine  (SYNTHROID , LEVOTHROID) 50 mcg tablet TAKE 1 TABLET BY MOUTH EVERY DAY  atorvastatin  (LIPITOR) 40 mg tablet TAKE 1 TABLET BY MOUTH IN THE EVENING  ondansetron  (ZOFRAN -ODT) 4 MG disintegrating tablet Take 1 tablet (4 mg total) by mouth 3 times daily as needed. Place on top of tongue.  acyclovir  (ZOVIRAX ) 400 mg tablet Take 1 tablet (400 mg total) by mouth 2 times daily for Infection caused by the Varicella Zoster Virus.  nystatin -triamcinolone  (MYCOLOG II) cream Apply topically 2 times daily as needed for Skin Infection due to Candida Yeast. Apply to affected area. Do not apply on or near surgical site.  Lidocaine -Menthol  (NERVIVE ROLL-ON EX) Apply 1 g topically as needed. For toe pain  Menthol , Topical Analgesic, (ICY HOT EX) Apply 1 applicator topically as needed (pain).  Menthol , Topical Analgesic, (BIOFREEZE EX) Apply 1 applicator topically as needed (pain). Active Hospital Medications:No current  facility-administered medications for this encounter. Objective: Vitals:Blood pressure 116/88, pulse 105, temperature 36.1 C (97 F), temperature source Temporal, resp. rate 20, height 1.676 m (5' 5.98), weight 111.6 kg (246 lb), SpO2 97%.Vitals in last 24 ymd:Ejupzwu Vitals for the past 24 hrs: BP Temp Temp src Pulse Resp SpO2 Height Weight 09/25/23 0700 116/88 36.1 C (97 F) TEMPORAL 105 20 97 % 1.676 m (5' 5.98) 111.6 kg (246 lb)   Physical ExamGEN - AO, NADPULM - BS bil, no w/r/s/c/acc muscle useC/V - Irregular rate, S1, S2, No m/r/gABD - NABS, soft, nt/ndEXT - No LE edemaNM - Answering questions appropriately, sits up independentlyLab ReviewLab Results Component Value Date  NA 140 09/24/2023  K 5.2 (H) 09/24/2023  CL 100 09/24/2023  CO2 27 09/24/2023  UN 27 (H) 09/24/2023  CREAT 1.16 (H) 09/24/2023  CREAT 0.87 07/03/2016  CA 9.7 09/24/2023  GLU 125 (H) 09/24/2023  WBC 6.7 09/18/2023  HCT 46 09/18/2023  HGB 13.7 09/18/2023  MCV 93 09/18/2023  PLT 294 09/18/2023  GFRB 92 11/05/2019  GFRC 80 11/05/2019  PTT 32.9 11/23/2022  INR 1.2 (H) 11/23/2022  PTI 13.4 (H) 11/23/2022 Anesthesiologist's Physical Status Rating of the Patient:  Class III: Severe Systemic DiseasePlan for Sedation:  Per Anesthesia.  Assessment: Carly Higgins is a 63 y.o. female with symptomatic afib.  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/29/2020  CHF (congestive heart failure)   pulmonary edema 05/28/15  Diabetes mellitus 08/23/2020  Female infertility   GERD (gastroesophageal reflux disease)   Heart murmur   Hypothyroidism 06/16/2021   Hypoxia 06/11/2015  Impaired mobility and ADLs 06/11/2015  Leukocytosis 09/22/2013  Lumbar radiculopathy 01/11/2015  Osteoarthrosis 06/16/2021  PCB (post coital bleeding)   post allogenic MUD (F)  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  Conditioned w/ Busulfan  130 mg/m2/d and Fludarabine  40 mg/m2/d x4 days followed by 10/10 HLA MUD (F) Allogeneic PBSCT (pt/donor: ABO: A+/A+, CMV N/N)  Pulmonary edema   S/P ablation of atrial fibrillation 09/02/2023  PVI, posterior wall isolation, and SVC by PFA by Dr. Tobie  s/p L THA 10/20/21 10/20/2021  Shortness of breath   Tobacco use  [2] Social HistoryTobacco Use Smoking Status Former  Packs/day: 0.25  Years: 31.00  Additional pack years: 0.00  Total pack years: 7.75  Types: Cigarettes  Quit date: 11/21/2022  Years since quitting: 0.8  Passive exposure: Past Smokeless Tobacco Never Tobacco Comments  Quit for surgery, states has a couple a day now since surgery [3] No Known Allergies (drug, envir, food or latex)

## 2023-09-25 NOTE — Anesthesia Preprocedure Evaluation (Addendum)
 Anesthesia Pre-operative History and Physical for Carly Higgins.63 y.o. female with a flutter presenting for Procedure(s) (LRB):Cardioversion (N/A) by Surgeon(s):Maggio, Jamie L, NP scheduled for  minutes.BMI Readings from Last 1 Encounters: 09/25/23 39.72 kg/m Visit VitalsBP 116/88 (BP Location: Left arm) Pulse 105 Temp 36.1 C (97 F) (Temporal) Resp 20 Ht 1.676 m (5' 5.98) Wt 111.6 kg (246 lb) SpO2 97% BMI 39.72 kg/m OB Status Postmenopausal Smoking Status Former BSA 2.28 m Past Medical History: Diagnosis Date . Atrial fibrillation   fib/flutter 05/2015 . Breast cancer  . 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 CHF (congestive heart failure)   pulmonary edema 05/28/15 . Diabetes mellitus 08/23/2020 . Female infertility  . GERD (gastroesophageal reflux disease)  . Heart murmur  . Hypothyroidism 06/16/2021 . Hypoxia 06/11/2015 . Impaired mobility and ADLs 06/11/2015 . Leukocytosis 09/22/2013 . Lumbar radiculopathy 01/11/2015 . Osteoarthrosis 06/16/2021 . PCB (post coital bleeding)  . 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 Pulmonary edema  . S/P ablation of atrial fibrillation 09/02/2023  PVI, posterior wall isolation, and SVC by PFA by Dr. Tobie . s/p L THA 10/20/21 10/20/2021 . Shortness of breath  . Tobacco use  Past Surgical History[1]No Known Allergies (drug, envir, food or latex)Current Medications[2]Current Outpatient Medications Medication Instructions . acyclovir  (ZOVIRAX ) 400 mg, Oral, 2 TIMES DAILY . amiodarone  (PACERONE ) 400 mg, Oral, DAILY . atorvastatin  (LIPITOR) 40 mg, Oral, EVERY EVENING . ELIQUIS  5 MG tablet TAKE 1 TABLET(5 MG) BY MOUTH EVERY 12 HOURS . escitalopram  (LEXAPRO ) 10 mg, Oral, DAILY  . furosemide  (LASIX ) 20 mg, Oral, DAILY PRN . levothyroxine  (SYNTHROID , LEVOTHROID) 50 mcg, Oral, DAILY . Lidocaine -Menthol  (NERVIVE ROLL-ON EX) 1 g, PRN . Menthol , Topical Analgesic, (BIOFREEZE EX) 1 applicator, PRN . Menthol , Topical Analgesic, (ICY HOT EX) 1 applicator, PRN . metoprolol  succinate ER (TOPROL -XL) 150 mg, Oral, DAILY, Do not crush or chew. May be divided. . MOUNJARO  7.5 MG/0.5ML pen ADMINISTER 7.5 MG UNDER THE SKIN EVERY 7 DAYS . nystatin -triamcinolone  (MYCOLOG II) cream Topical, 2 TIMES DAILY PRN, Apply to affected area. Do not apply on or near surgical site. . ondansetron  (ZOFRAN -ODT) 4 mg, Oral, 3 TIMES DAILY PRN, Place on top of tongue. CARDIAC: ECHO COMPLETE 07/19/2024Interpretation Summary.  Patient is in atrial fibrillation with a fast ventricular rate of 110-130.SABRA  There is concentric hypertrophy. The left ventricular ejection fraction is mildly decreased. The calculated LVEF is 43% globally reduced..  Left atrial enlargement..  There is mild-to-moderate mitral regurgitation..  There is mild tricuspid regurgitation..  The size of the ascending aorta is borderline enlarged. Aortic Diameters: sinus = 2.8 cm, tubular = 3.7 cm, arch = 2.8 cm..  Compared to prior echocardiogram her overall LV function is more globally reduced to 43% and was 65%.  There is noted mitral regurgitation as well as tricuspid agitation.  Patient is in a fast atrial fibrillation. CARDIAC CATHETERIZATION 09/16/2024ConclusionS/p successful cardioversion. Pt was shocked with 200 J and 360 J x 2 with conversion of NSR for a few second with early return of atrial fibrillation. ELECTROPHYSIOLOGY 08/26/2025Narrative.  Successful afib ablation performed with PFA resulting in complete Pulmonary Vein, Posterior Wall and SVC isolation EPATCH 14-DAY MONITOR 07/11/2024NarrativePatient monitored for 14d, analyzable time was 11d 6h starting on  06/20/2022 04:00 pm.Primary rhythm was Atrial Fibrillation / Flutter. Average heart rate was 99 bpm, Minimum heart  rate was 59 bpm on Day 13/ 08:22:54 am, Max heart rate was 196 bpm on Day 2 / 07:26:20 pmAtrial Fibrillation or Flutter: Burden was 54.95 %, longest event 3d 23h on Day 1 / 04:05:20 pm, fastest event 196 bpm onDay 1 / 04:05:20 pmSVE(s): Burden was 0.59 %, 9375 total SVE(s)SV Arrhythmia(s): 46 event(s), longest event 23 beats on Day 6 / 12:42:21 am, fastest event 141 bpm on Day 8 / 10:31:51amPVC(s): Burden was 0.03 %, 430 total PVC(s), 3 disparate morphologiesVentricular Arrhythmia(s): 1 event(s), longest event 4 beats at Day 15 / 10:25:52 am, fastest event 185 bpm at Day 15 /10:25:52 amPatient recorded 4 event(s) during the monitoring periodCardioversion not required as patient paroxysmal.LABS:  Lab results: 09/10/250945 WBC 6.7 Hemoglobin 13.7 Hematocrit 46 RBC 5.0 Platelets 294   Lab results: 09/16/251025 Sodium 140 Potassium 5.2* Chloride 100 CO2 27 UN 27* Creatinine 1.16* Glucose 125* Calcium  9.7 Total Protein 5.9* Albumin 4.1 ALT 63* AST 42* Alk Phos 92 Bilirubin,Total 1.1 eGFR BY CREAT (no units) Date Value 09/24/2023 53 (!)   Lab results: 09/10/250945 Magnesium  1.9 Lab Results Component Value Date  HA1C 5.8 (H) 05/20/2023 Lines/Drains/Airways/Wounds   Line  Duration       Peripheral IV 09/25/23 0705 20 G Left Antecubital <1 day    Wound  Duration       Wound 09/02/23 Anterior;Proximal;Right Groin Cath Entry/Exit Site 22 days    .Anesthesia Evaluation Information Source: records, patient   ANESTHESIA HISTORY     Denies anesthesia historyPertinent(-):  No History of anesthetic complications or Family hx of anesthetic complicationsGENERALPertinent (-):  No history of anesthetic complications or Family Hx of Anesthetic  ComplicationsHEENT  + Visual Impairment        corrective lens for ADL PULMONARY  + Smoker        tobacco quit recently  + Shortness of Breath  + Snoring        daytime tiredness  + Sleep apnea        high suspicionPertinent(-):  No asthma or COPDCARDIOVASCULARGood(4+METs) Exercise Tolerance  + Cardiac Testing        more details above, echo, 43% ejection fraction  Comment: Limited d/t pain to hip and neuropathy to feet. She does housework and Pharmacologist. She uses a scooter to walk the dog. DASI score: 18.95 points, 5.07 METSGI/HEPATIC/RENAL NPO: > 8hrs ago (solids) and > 2hrs ago (clears)  + Nausea (Endorses from Mounjaro  usage)  + Vomiting (Endorses from Mounjaro  usage)Pertinent(-):  No GERD, renal issues or urinary issues  NEURO/PSYCH/ORTHO  + Chronic pain        lower back  + Neuropsychiatric Issues        depression  + Gait/Mobility issues        cane, walkerPertinent(-):  No syncope, seizures or cerebrovascular eventENDO/OTHER  + Diabetes Mellitus        Type 2 no insulin   + Thyroid  Disease        HYPOthyroid   + Chemo HxHEMATOLOGIC  + Bruises/bleeds easily  + Anticoagulants/Antiplatelets        apixaban   + Blood Transfusion (Bone marrow transfusion of rmyeloproliferative dz, 2017. No reaction)  + Blood dyscrasia        hyperlipidemia  + Arthritis        lumbar and hips Physical ExamAirway          Mallampati: IIDental   Normal Exam Cardiovascular         Rhythm: irregular         Rate:  normal Pulmonary  Comment: Normal respiratory excursionPt not short of breathMental Status   oriented to person, place and time ________________________________________________________________________PLANASA Score  3Anesthetic Plan MAC Induction (routine IV) General Anesthesia/Sedation Maintenance Plan (IV bolus);  Airway Manipulation (none); Line ( use current access);  Monitoring (standard ASA); Positioning (supine); Pain (per surgical team); PostOp (PACU)Standard AttestationInformed Consent   Risks:        Risks discussed were commensurate with the plan listed above with the following specific points: N/V and sore throat, Damage to: teeth, allergic Rx and unexpected serious injury.  Anesthetic Consent:       Anesthetic plan (and risks as noted above) were discussed with patient  Plan also discussed with team members including:     CRNAResponsible Anesthesia Provider Attestation:I attest that the patient or proxy understands and accepts the risks and benefits of the anesthesia plan. I also attest that I have personally performed a pre-anesthetic examination and evaluation, and prescribed the anesthetic plan for this particular location within 48 hours prior to the anesthetic as documented. Dale Hutchinson, MD  09/25/23, 7:26 AM [1]Past Surgical History:Procedure Laterality Date . BONE MARROW TRANSPLANT  2017 . Mediport insertion   . PR ARTHRP ACETBLR/PROX FEM PROSTC AGRFT/ALGRFT Left 10/20/2021  Procedure: LEFT POSTERIOR ARTHROPLASTY, HIP, TOTAL;  Surgeon: Debrah Lot, MD;  Location: [facility] MAIN OR;  Service: Orthopedics . PR ARTHRP ACETBLR/PROX FEM PROSTC AGRFT/ALGRFT Right 12/21/2022  Procedure: RIGHT POSTERIOR ARTHROPLASTY, HIP, TOTAL;  Surgeon: Debrah Lot, MD;  Location: [facility] MAIN OR;  Service: Orthopedics . PR AXILLARY LYMPHADENECTOMY COMPLETE Left 01/24/2021  Procedure: sentinel lymph node biopsy;  Surgeon: Patterson Caldron, MD;  Location: [facility] MAIN OR;  Service: Oncology General . PR CARDIOVERSION ELECTIVE ARRHYTHMIA EXTERNAL N/A 09/24/2022  Procedure: Cardioversion;  Surgeon: Ana Warren CROME, NP;  Location: [facility] EP LABS;  Service: Cardiovascular . PR COMPRE EP EVAL ABLTJ ATR FIB PULM VEIN ISOLATION N/A 09/02/2023  Procedure: Ablation - A-Fib;  Surgeon: [name] SQUIBB, MD;  Location: [facility] EP LABS;  Service:  Cardiovascular . PR MASTECTOMY PARTIAL Left 01/24/2021  Procedure: LEFT NEEDLE LOC PARTIAL MASTECTOMY   ;  Surgeon: Patterson Caldron, MD;  Location: [facility] MAIN OR;  Service: Oncology General . ROTATOR CUFF REPAIR Right  [2]No current facility-administered medications for this encounter.

## 2023-09-25 NOTE — Telephone Encounter (Signed)
 Patient s/p cardioversion today , d/c home in NSR. Pt called office reporting Heart Racing, and unable to catch her breath. Pt states that she is having trouble breathing while sitting on couch, and feels like her heart wont stop pounding. Writer states that patient that she should go to the Emergency Department. Pt verbalized understanding, states that she will call 911.Patient informed Clinical research associate that she is ok to call 911 herself. Writer called ED Event organiser, report to [place], Charity fundraiser.Wanda FORBES Levy, RN

## 2023-09-25 NOTE — ED Notes (Signed)
 09/25/23 1529 Expected Patient Date of notification 09/25/23 Time Notified 1529 Notified by Office/PCP(EP cardiology) ED Service Adult Call-in Pt Info note/Reason for sending 14F, s/p cardioversion this morning for afib, successful, pt was in sinus brady when d/c home, pt called this afternoon c/o SOB while sitting and heart racing, calling EMS for transport Patient coming from Home Expected Call-In Information Requested Evaluation By Adult ED Report called to call in note

## 2023-09-25 NOTE — Anesthesia Case Conclusion (Signed)
 CASE CONCLUSION  Emergence  Assessment:  Routine  Transport  Directly to: PACU  Airway:  Facemask  Oxygen Delivery:  6 lpm  Position:  Recumbent  Patient Condition on Handoff  Level of Consciousness:  Alert/talking/calm  Patient Condition:  Stable  Handoff Report to:  RN

## 2023-09-25 NOTE — Progress Notes (Addendum)
 Pt arrived to [facility] PACU s/p cardioversion. VSS. A/ox4. No c/o pain. NSR on tele.Post EKG obtained.See flow sheets for complete vitals and assessments.D/c instructions reviewed and pt stated understanding. D/c home.

## 2023-09-25 NOTE — Discharge Instructions (Signed)
 [facility] LabPatient Discharge InstructionsAttending Physician: Warren Clarity, NP                                Date:9/17/2025Procedure: CardioversionDIETResume your previous diet.PAIN MANAGEMENT/OTHERFor mild pain you can take acetaminophen  (Tylenol ) as needed.If you develop itching where the patches were placed you can apply Hydrocortisone  ointment to the affect area.ACTIVITYResume your usual activities tomorrow.If you were driving prior to your procedure do not drive, operate heavy machinery, climb any ladders or chairs, or make any major life decision for 24 hours.BATHING/SHOWERINGNo restrictions.MEDICATIONSResume your previous medications as outlined in the medication section of your discharge instructions. If you have new prescriptions please fill them right away.ANTICOAGULATIONYou are on Eliquis . It is very important you continue this until your cardiologist tells you that it is ok to stop.Within 24 hours of hospital discharge:If you have a procedure-related issue within 24 hrs of your procedure, please contact the electrophysiology doctor on call at [phone] hours after hospital dischargeIf you experience chest pain, shortness of breath, or other problems after 24 hours of hospital discharge call Ty Laneta MATSU, MD at [phone].If you have a procedure-related issue after 24 hours, call Dr. Tobie at [phone].DAILY WEIGHTFor some conditions (such as heart failure) you will need to weigh yourself every day at the same time on the same scale, preferably after you empty your bladder. If you have an increase of 3 pounds in 2 days or 5 pounds in 1 week you should contact your physician. A daily written log of your weights is a good way to keep track. You should follow your doctor's recommendations on monitoring your weight.SMOKING CESSATIONSmoking can increase your chances of  developing chronic health problems or worsen conditions you already have.  If you smoke you should quit.  Medications to help you quit are available.  Ask your doctor if you would like to receive these medications.FOLLOW-UP You have a follow-up appointment with Dr. Tobie on 10/16/2023 at 10:30 amProvider Signature: RAINELL CINDERELLA RATTLER, NP

## 2023-09-25 NOTE — Anesthesia Procedure Notes (Signed)
---------------------------------------------------------------------------------------------------------------------------------------  AIRWAY GENERAL INFORMATION AND STAFF  Patient location during procedure: OR     Date of Procedure: 09/25/2023 8:13 AMCONDITION PRIOR TO MANIPULATION   Current Airway/Neck Condition:  Normal      For more airway physical exam details, see Anesthesia PreOp EvaluationFINAL AIRWAY DETAILS  Final Airway Type:  Mask  Adjunct Airway: soft bite block  Type of Mask: NRB  Head position required to avoid obstruction:  Turned to right----------------------------------------------------------------------------------------------------------------------------------------

## 2023-09-25 NOTE — Anesthesia Postprocedure Evaluation (Signed)
 Anesthesia Post-Op NotePatient: Carly HERO TaylorProcedure(s) Performed:Procedure SummaryDate:09/25/2023 Anesthesia Start: 09/25/2023  8:06 AM Anesthesia Stop: 09/25/2023  8:26 AM Room / Location:[facility] EP LAB 2 / [facility] EP LABS Procedure(s):Cardioversion Diagnosis:a flutter Surgeon(s):Maggio, Warren CROME, NP Responsible Anesthesia Provider:Avryl Roehm, DALE, MD Recovery VitalsBP: 102/79 (09/25/2023  9:00 AM)Heart Rate: 73 (09/25/2023  9:00 AM)Resp: 18 (09/25/2023  9:00 AM)Temp: 35.7 C (96.3 F) (09/25/2023  8:24 AM)SpO2: 97 % (09/25/2023  9:00 AM)0-10  Pain Scale: 0 (09/25/2023  9:00 AM)Anesthesia type:MACComplications Noted During Procedure or in PACU:None Comment:  Patient Location:PACULevel of Consciousness:  Recovered to baselinePatient Participation:   Able to participateTemperature Status:  NormothermicOxygen Saturation:  Within patient's normal rangeCardiac Status: within patient's normal rangeFluid Status:  StableAirway Patency:   YesPulmonary Status:  Baseline and stablePain Management:  Adequate analgesiaNausea and Vomiting:None  Post Op Assessment:  Tolerated procedure wellResponsible Anesthesia Provider Attestation:All indicated post anesthesia care provided -

## 2023-09-25 NOTE — ED Triage Notes (Signed)
 Pt with cardioversion for afib this AM. Pt was home and in usual health but now feeling palpitations with SOB and concerned she is in afib again. HR 75 for triage vitals. EKG in triage.   Prehospital medications given: No

## 2023-09-25 NOTE — ED Notes (Signed)
 Pt told staff she would be leaving at this time. Pt encouraged to stay, return precautions explained to patient. Pt left at this time. Removed from system by Clinical research associate.

## 2023-09-25 NOTE — Progress Notes (Signed)
 Report Given Larraine, Recovery RN  Procedure SummaryProcedure: CV Baseline Neuro Status: A&O x 4, MAE, answers questions appropriately Intervention/Results: CV x 1 @ 360J into NSRSedation Type and Amt: see anesthesia note Active Issues/Relevant EventsPost Procedure Neuro Status: phase I In Lab Complications: none Concerns: none Post Procedure Hydration: N/A To Do ListPost sedation monitoringBedrest: 1 hour Anticipatory Guidance/Discharge PlanningDischarge home when medically appropriate

## 2023-09-25 NOTE — Telephone Encounter (Signed)
 Symptom CallWhat symptom(s):SOB/Heart racingAny heart rate, blood pressure or weights to report:Unk Have you had these symptoms today?: yesIf yes, check warm transfer symptom list to see if requires a warm transfer to RN.If yes, call warm transferred to [id number]: ______________RN (first name only).Pt is calling stating that this morning she had a cardioversion. Pt states for a while she was feeling fine but now her heart is racing and she's out of breathe. Pt reports that it comes and goes but when it comes on, its comes on hard. No chest pain Please advise

## 2023-09-25 NOTE — Plan of Care (Addendum)
 Cath Lab / EP NP Note: S/p successful atrial fibrillation cardioversion on 09/25/2023 by Warren Clarity, NP.  Received 1 shock.  Tolerated procedure well.  SR HR 60s.  Plan:-  Continue home medications including apixaban  (Eliquis ) 5 mg twice daily, Metoprolol  Succinate 150 mg daily and Amiodarone  400 mg daily till 9/27 and down to 200 mg daily thereafter  -  Follow up with Dr Tobie on 10/16/2023 at 10:30 am-  Discharge home after 30 minutes to 1 hour of post anesthesia care is completed.  RAINELL CINDERELLA RATTLER, NP     09/25/2023    8:35 AMAddendum:Since pt does not feel well with high dose of Amiodarone  will reduce it from 400 mg to 200 mg starting tomorrow per Dr Tobie ( as already took today's dose).  Pt agreeable with the dose change.  RAINELL CINDERELLA RATTLER, NP  09/25/2023   9:06 AM .

## 2023-09-26 ENCOUNTER — Encounter: Payer: Self-pay | Admitting: Family

## 2023-09-26 NOTE — Telephone Encounter (Signed)
 Pt is calling to let providers know that she feels a lot better after going into emergency room. Pt is not in Afib and her EKG looked fine. FYI.

## 2023-09-27 ENCOUNTER — Telehealth: Payer: Self-pay | Admitting: Cardiology

## 2023-09-27 NOTE — Telephone Encounter (Signed)
 Spoke with Carly Higgins. She believes that she is in a regular rhythm with a controlled heart rate. Pt has been feeling bloated for weeks, like she ate a 3 course dinner. Asked that she contact PCP to discuss symptoms (takes mounjaro ). She agreed with this plan.

## 2023-09-27 NOTE — Telephone Encounter (Signed)
 Symptom CallWhat symptom(s):bloating Any heart rate, blood pressure or weights to report: none Have you had these symptoms today?:no If yes, check warm transfer symptom list to see if requires a warm transfer to RN.If yes, call warm transferred to [id number]: ______________RN (first name only).

## 2023-10-02 ENCOUNTER — Telehealth: Payer: Self-pay | Admitting: Hematology and Oncology

## 2023-10-02 NOTE — Telephone Encounter (Signed)
 Pt is calling to reschedule her 9:30 on 11/12

## 2023-10-03 LAB — EKG 12-LEAD
P: 55 deg
PR: 212 ms
QRS: 116 deg
QRSD: 87 ms
QT: 432 ms
QTc: 479 ms
Rate: 74 {beats}/min
T: 191 deg

## 2023-10-07 ENCOUNTER — Telehealth: Payer: Self-pay

## 2023-10-07 NOTE — Telephone Encounter (Signed)
Spoke to patient and stress test instructions given.

## 2023-10-09 ENCOUNTER — Telehealth: Payer: Self-pay | Admitting: Cardiology

## 2023-10-09 ENCOUNTER — Ambulatory Visit: Payer: Self-pay | Admitting: Internal Medicine

## 2023-10-09 ENCOUNTER — Ambulatory Visit
Admission: RE | Admit: 2023-10-09 | Discharge: 2023-10-09 | Disposition: A | Discharge status: Home / Self Care | Source: Ambulatory Visit | Attending: Cardiology | Admitting: Cardiology

## 2023-10-09 ENCOUNTER — Other Ambulatory Visit: Payer: Self-pay

## 2023-10-09 DIAGNOSIS — Z8249 Family history of ischemic heart disease and other diseases of the circulatory system: Secondary | ICD-10-CM | POA: Insufficient documentation

## 2023-10-09 DIAGNOSIS — I4891 Unspecified atrial fibrillation: Secondary | ICD-10-CM | POA: Insufficient documentation

## 2023-10-09 LAB — NUC SPECT MPI STRESS/REST STUDY
BMI: 39.38 kg/m2
BP Diastolic: 89 mmHg
BP Systolic: 143 mmHg
BSA: 2.27 m2
Heart Rate: 59 {beats}/min
Height: 66 in
LV CO BSA Index: 1.22 L/min/m2
LV Cardiac Output: 2.77 L/min
LV Diastolic Volume Index: 76.2 mL/m2
LV SV BSA Index: 20.7 mL/m2
LV SV Height Index: 28 mL/m
LV Stroke Volume: 47 mL
LV Systolic Volume Index: 55.5 mL/m2
LVED Volume BSA Index: 76 mL/m2
LVED Volume BSA Index: 76.2 mL/m2
LVED Volume Height Index: 103.2 mL/m
LVED Volume: 173 mL
LVEF (Volume): 27 %
LVEF Stress: 32 %
LVEF: 27 %
LVES Volume BSA Index: 55.5 mL/m2
LVES Volume BSA Index: 56 mL/m2
LVES Volume Height Index: 75.2 mL/m
LVES Volume: 126 mL
Peak DBP: 81 mmHg
Peak Filling Rate: 0.8 EDV/sec
Peak HR: 70 {beats}/min
Peak SBP: 137 mmHg
Percent MPHR: 44.6 %
RPP: 9590 BPM x mmHG
RR Interval: 1016.95 ms
Stress LV SV BSA Index: 25.1 mL/m2
Stress LV SV Height Index: 34 mL/m
Stress LV Stroke Volume Change: 10 mL
Stress LV Stroke Volume: 57 mL
Stress LVED Volume BSA Index: 79 mL/m2
Stress LVED Volume BSA Index: 79.3 mL/m2
Stress LVED Volume Change: 7 mL
Stress LVED Volume Height Index: 107.4 mL/m
Stress LVED Volume: 180 mL
Stress LVEF (Volume) Change: 4.5 %
Stress LVEF (Volume): 31.7 %
Stress LVES Volume BSA Index: 54 mL/m2
Stress LVES Volume BSA Index: 54.2 mL/m2
Stress LVES Volume Change: -3 mL
Stress LVES Volume Height Index: 73.4 mL/m
Stress LVES Volume: 123 mL
Stress Peak Filling Rate: 1.09 EDV/sec
Stress duration (min): 3 min
Weight (lbs): 244 [lb_av]
Weight: 3904 [oz_av]

## 2023-10-09 MED ORDER — CAFFEINE CITRATE 20 MG/ML IJ SOLN *I*
60.0000 mg | INTRAVENOUS | Status: DC | PRN
Start: 2023-10-09 — End: 2023-12-08
  Administered 2023-10-09: 60 mg via INTRAVENOUS

## 2023-10-09 MED ORDER — SODIUM CHLORIDE 0.9 % INJ (FLUSH) WRAPPED *I*
10.0000 mL | Status: DC | PRN
Start: 2023-10-09 — End: 2023-10-10
  Administered 2023-10-09: 10 mL via INTRAVENOUS

## 2023-10-09 MED ORDER — SODIUM CHLORIDE 0.9 % IV SOLN WRAPPED *I*
300.0000 mL | Status: DC
Start: 2023-10-09 — End: 2023-10-10
  Administered 2023-10-09: 300 mL via INTRAVENOUS

## 2023-10-09 MED ORDER — TECHNETIUM TC-99M SESTAMIBI (CARDIOLITE) IV *I*
4.0000 | PACK | Freq: Once | INTRAVENOUS | Status: AC
Start: 2023-10-09 — End: 2023-10-09
  Administered 2023-10-09: 9.9 via INTRAVENOUS

## 2023-10-09 MED ORDER — TECHNETIUM TC-99M SESTAMIBI (CARDIOLITE) IV *I*
12.0000 | PACK | Freq: Once | INTRAVENOUS | Status: AC
Start: 2023-10-09 — End: 2023-10-09
  Administered 2023-10-09: 24.6 via INTRAVENOUS

## 2023-10-09 MED ORDER — ALBUTEROL SULFATE (2.5 MG/3ML) 0.083% IN NEBU *I*
2.5000 mg | INHALATION_SOLUTION | RESPIRATORY_TRACT | Status: DC | PRN
Start: 2023-10-09 — End: 2023-12-08

## 2023-10-09 MED ORDER — REGADENOSON 0.4 MG/5ML (LEXISCAN) IV SOLN *I*
0.4000 mg | INTRAVENOUS | Status: AC | PRN
Start: 2023-10-09 — End: 2023-10-09
  Administered 2023-10-09: 0.4 mg via INTRAVENOUS

## 2023-10-09 NOTE — Discharge Instructions (Signed)
 Pt is here for a Nuclear stress test. Instructed pt to resume medications and to follow up with referring provider. Pt verbalized understanding.

## 2023-10-09 NOTE — Telephone Encounter (Signed)
 Pt will like a call to discuss results from nuclear test.

## 2023-10-10 ENCOUNTER — Other Ambulatory Visit: Payer: Self-pay | Admitting: Cardiology

## 2023-10-10 ENCOUNTER — Telehealth: Payer: Self-pay | Admitting: Cardiology

## 2023-10-10 DIAGNOSIS — I4819 Other persistent atrial fibrillation: Secondary | ICD-10-CM

## 2023-10-10 NOTE — Progress Notes (Signed)
 Schedule Cor angio and RHC. After 10/17 given recent cardioversion.

## 2023-10-10 NOTE — Telephone Encounter (Signed)
 I spoke to Carly Higgins about her nuclear test results.  This shows worsening EF with possible infarct.  This is despite her maintaining sinus rhythm.  She is having continued shortness of breath.  I think it is best to proceed with coronary angiography at this point to exclude occlusive CAD.  I reviewed the procedure with her in detail and she is agreeable to proceed.  She does have appointment with us  tomorrow or we can evaluate her current medications and titrate diuretics as needed.

## 2023-10-11 ENCOUNTER — Ambulatory Visit: Attending: Cardiology | Admitting: Cardiology

## 2023-10-11 ENCOUNTER — Other Ambulatory Visit: Payer: Self-pay

## 2023-10-11 ENCOUNTER — Encounter: Payer: Self-pay | Admitting: Cardiology

## 2023-10-11 VITALS — BP 120/72 | HR 60 | Ht 66.0 in | Wt 245.0 lb

## 2023-10-11 DIAGNOSIS — Z789 Other specified health status: Secondary | ICD-10-CM

## 2023-10-11 DIAGNOSIS — I4891 Unspecified atrial fibrillation: Secondary | ICD-10-CM | POA: Insufficient documentation

## 2023-10-11 DIAGNOSIS — R9431 Abnormal electrocardiogram [ECG] [EKG]: Secondary | ICD-10-CM

## 2023-10-11 DIAGNOSIS — Z79899 Other long term (current) drug therapy: Secondary | ICD-10-CM | POA: Insufficient documentation

## 2023-10-11 DIAGNOSIS — I4892 Unspecified atrial flutter: Secondary | ICD-10-CM | POA: Insufficient documentation

## 2023-10-11 MED ORDER — AMIODARONE HCL 400 MG PO TABS *I*
200.0000 mg | ORAL_TABLET | Freq: Every day | ORAL | Status: DC
Start: 2023-10-11 — End: 2023-10-11

## 2023-10-11 MED ORDER — AMIODARONE HCL 200 MG PO TABS *I*: 200.0000 mg | ORAL_TABLET | Freq: Every day | ORAL | 3 refills | Status: DC

## 2023-10-11 MED ORDER — FUROSEMIDE 20 MG PO TABS *I*
20.0000 mg | ORAL_TABLET | Freq: Every day | ORAL | 3 refills | Status: AC
Start: 2023-10-11 — End: ?

## 2023-10-11 NOTE — Patient Instructions (Addendum)
 You were seen today for follow up of atrial fibrillationIncrease furosemide  to every day. If you are still waking up at night short of breath, call usContinue amiodarone  200 mg daily, metoprolol  150 mg daily and eliquisSomeone should be calling you soon with instructions for the angiogramIn terms of your afib, we'll arrange follow up [place] Keisy Strickler, [phone]

## 2023-10-11 NOTE — Addendum Note (Signed)
 Addended by: AISHA MAIDEN E on: 10/11/2023 11:36 AM Modules accepted: Orders

## 2023-10-11 NOTE — Progress Notes (Addendum)
 [facility]      Electrophysiology [address] [employer], [place]  [id number]/3/2025[name] was seen today at the [facility] for follow-up of atrial fibrillation.History of Present Illness:  Carly Higgins is a 63 y.o. female with a history of anxiety/depression, osteoarthritis, lumbar radiculopathy, hypothyroidism, HLD, GERD, DM type 2, mild cardiomyopathy (EF 43% on 07/27/22), breast Ca, mild-mod mitral regurgitation/mild tricuspid regurgitation, and atrial fibrillation which was initially paroxysmal and then progressed to persistent. She was cardioverted in September 2024 but had early return of atrial fibrillation.  She was discharged on apixaban  (Eliquis ) 5 mg BID, Amiodarone  200 mg twice daily and Metoprolol  Succinate Toprol  XL 150 mg daily.   We were previously planning A-fib ablation, but  she wanted to have her hip surgery in Dec, for which she would need to hold eliquis , which is too early post ablation, so she elected to postpone AF ablation.  We ended up stopping amiodarone  and increasing Toprol -XL to 200 mg a day.  She did have her hip replacement surgery done and went to [place]  for the winter.  Unfortunately she had bronchitis and possibly COVID.  She had significant hypotension and metoprolol  was cut back.  She is s/p AFIB ablation 09/02/23 and DCCV 9/17.  She had a recent nuclear stress test showing no ischemia but infarct and reduced LVEF 27% (down from 43% July 2024).  She is scheduled for coronary angio & RHC 10/24.She has noticed significant dyspnea for the last 3 months.  She doesn't notice orthopnea but has been having PND 3-4 nights a week.  She has had weight gain, bloating and abdominal fullness.  She has had fatigue and intermittent dizziness.  She denies chest pain, palpitations and presyncope/syncope.Past Medical History[1]Past Surgical History[2]Current Outpatient Medications  Medication Sig Note  metoprolol  succinate ER (TOPROL -XL) 100 mg 24 hr tablet Take 1.5 tablets (150 mg total) by mouth daily for High Blood Pressure. Do not crush or chew. May be divided.   escitalopram  (LEXAPRO ) 10 mg tablet TAKE 1 TABLET(10 MG) BY MOUTH DAILY   ELIQUIS  5 MG tablet TAKE 1 TABLET(5 MG) BY MOUTH EVERY 12 HOURS   acyclovir  (ZOVIRAX ) 400 mg tablet Take 1 tablet (400 mg total) by mouth 2 times daily for Infection caused by the Varicella Zoster Virus.   levothyroxine  (SYNTHROID , LEVOTHROID) 50 mcg tablet TAKE 1 TABLET BY MOUTH EVERY DAY 11/23/2022: A.M.  atorvastatin  (LIPITOR) 40 mg tablet TAKE 1 TABLET BY MOUTH IN THE EVENING   amiodarone  (PACERONE ) 400 MG tablet Take 0.5 tablets (200 mg total) by mouth daily for Atrial Flutter.   furosemide  (LASIX ) 20 mg tablet Take 1 tablet (20 mg total) by mouth daily.   MOUNJARO  7.5 MG/0.5ML pen ADMINISTER 7.5 MG UNDER THE SKIN EVERY 7 DAYS (Patient not taking: Reported on 10/11/2023)   ondansetron  (ZOFRAN -ODT) 4 MG disintegrating tablet Take 1 tablet (4 mg total) by mouth 3 times daily as needed. Place on top of tongue.   nystatin -triamcinolone  (MYCOLOG II) cream Apply topically 2 times daily as needed for Skin Infection due to Candida Yeast. Apply to affected area. Do not apply on or near surgical site.   Menthol , Topical Analgesic, (ICY HOT EX) Apply 1 applicator topically as needed (pain).  No current facility-administered medications for this visit. Allergies[3]Vitals:  10/11/23 0836 BP: 120/72 Pulse: 60 Weight: 111.1 kg (245 lb) Height: 1.676 m (5' 6) Review of Systems Constitutional:  Positive for malaise/fatigue. Respiratory:  Positive for cough (productive) and shortness of breath.  Cardiovascular:  Positive for PND. Negative for chest pain and orthopnea. Gastrointestinal:  Positive for abdominal pain (bloating). Neurological:  Positive for dizziness (intermittent lightheadedness and shakiness).  Negative for loss of consciousness.  Physical ExamVitals reviewed. Constitutional:     Appearance: Normal appearance. HENT:    Head: Normocephalic and atraumatic. Cardiovascular:    Rate and Rhythm: Normal rate and regular rhythm.    Heart sounds: No murmur heard.   No friction rub. No gallop. Pulmonary:    Effort: Pulmonary effort is normal.    Breath sounds: No wheezing, rhonchi or rales. Musculoskeletal:    Right lower leg: No edema.    Left lower leg: No edema. Skin:   General: Skin is warm and dry. Neurological:    General: No focal deficit present.    Mental Status: She is alert. Psychiatric:       Mood and Affect: Mood normal.  CHA2DS2-VASC and HAS-BLED Scoring: CHA2DS2-VASC Score: 3 with a risk of CHA2DS2-VASC PERCENT RISK: 3.2%HAS-BLED Score: 0 with HAS-BLED PERCENT RISK: 0.9%Labs  Lab results: 09/16/251025 Sodium 140 Potassium 5.2* Chloride 100 CO2 27 UN 27* Creatinine 1.16* Glucose 125* Calcium  9.7   Lab results: 09/10/250945 Magnesium  1.9   Lab results: 09/16/251025 08/11/251210 05/12/251050 Total Protein 5.9*   < > 6.8 Albumin 4.1   < > 4.1 ALT 63*   < > 17 AST 42*   < > 23 Alk Phos 92   < > 96 Bilirubin,Total 1.1   < > 0.6 Bilirubin,Direct  --   --  <0.2  < > = values in this interval not displayed.   Lab results: 05/12/251050 TSH 2.62 Cardiac TestingECG: SR @ 59 bpm with abnormal T waves ECHO COMPLETE 07/19/2024Interpretation Summary  Patient is in atrial fibrillation with a fast ventricular rate of 110-130.  There is concentric hypertrophy. The left ventricular ejection fraction is mildly decreased. The calculated LVEF is 43% globally reduced.  Left atrial enlargement.  There is mild-to-moderate mitral regurgitation.  There is mild tricuspid regurgitation.  The size of the ascending aorta is borderline enlarged. Aortic  Diameters: sinus = 2.8 cm, tubular = 3.7 cm, arch = 2.8 cm.  Compared to prior echocardiogram her overall LV function is more globally reduced to 43% and was 65%.  There is noted mitral regurgitation as well as tricuspid agitation.  Patient is in a fast atrial fibrillation. NUC SPECT MPI STRESS/REST STUDY 10/01/2025Interpretation SummaryNUCLEAR CARDIOLOGY REGADENOSON STRESS AND REST CZT SPECT MYOCARDIAL PERFUSION IMAGING STUDY1. A 1-day (rest/stress) pharmacologic study was performed using IV regadenoson 0.4 mg administered over 10 seconds followed by a saline flush without low-level exercise. The stress radiopharmaceutical was administered IV approximately 20 seconds after flush. Pt declined to walk on the treadmill. Patient squeezed a ball in left hand and mover her feet 2 minutes before and during regadenoson administration. Intravenous caffeine was used as a reversal agent. Dyspnea, flushing, lightheadedness and nausea were reported starting 1:00 min into the regadenoson infusion. Caffeine citrate 60 mg I.V. was administered at 3:00 min recovery with resolution of symptoms. Hypertension with rest and stress imaging.2. ECG was abnormal at rest without vasodilator-induced repolarization change.3. SPECT MPI: Abnormal study. Dilated, hypertrophied LV with stress and rest perfusion defects consistent with extensive infarction (10% LV) of the apex and anteroseptum. No evidence of vasodilator-induced ischemia is noted.4. ECG-gated SPECT MPI: Abnormal study. Dilated, hypertrophied LV (ESVI = 54 cc/m) with reduced LVEF (27%) and diffuse dysfunction worst in the mid to apical anterior and anteroseptal wall. RV size and function appear  normal.5. No prior study is available for comparison.-- Reported 10/09/2023 at 4:46 PM -- CARDIAC CATHETERIZATION 09/17/2025ConclusionS/p successful cardioversion. Patient converted to NSR/ SB. ELECTROPHYSIOLOGY  08/26/2025Narrative  Successful afib ablation performed with PFA resulting in complete Pulmonary Vein, Posterior Wall and SVC isolation EPATCH 14-DAY MONITOR 07/11/2024NarrativePatient monitored for 14d, analyzable time was 11d 6h starting on 06/20/2022 04:00 pm.Primary rhythm was Atrial Fibrillation / Flutter. Average heart rate was 99 bpm, Minimum heart rate was 59 bpm on Day 13/ 08:22:54 am, Max heart rate was 196 bpm on Day 2 / 07:26:20 pmAtrial Fibrillation or Flutter: Burden was 54.95 %, longest event 3d 23h on Day 1 / 04:05:20 pm, fastest event 196 bpm onDay 1 / 04:05:20 pmSVE(s): Burden was 0.59 %, 9375 total SVE(s)SV Arrhythmia(s): 46 event(s), longest event 23 beats on Day 6 / 12:42:21 am, fastest event 141 bpm on Day 8 / 10:31:51amPVC(s): Burden was 0.03 %, 430 total PVC(s), 3 disparate morphologiesVentricular Arrhythmia(s): 1 event(s), longest event 4 beats at Day 15 / 10:25:52 am, fastest event 185 bpm at Day 15 /10:25:52 amPatient recorded 4 event(s) during the monitoring periodCardioversion not required as patient paroxysmal. Assessment/Plan:Carly Higgins is a 63 y.o. year old female with a history of anxiety/depression, osteoarthritis, lumbar radiculopathy, hypothyroidism, HLD, GERD, DM type 2, mild cardiomyopathy (EF 43% on 07/27/22), breast Ca, mild-mod mitral regurgitation/mild tricuspid regurgitation, and atrial fibrillation which was initially paroxysmal and then progressed to persistent.Atrial fibrillation- initially paroxysmal, then persistent- rhythm control postponed a few times due to needing to hold anticoagulation for orthopedic surgeries and wintering in [place] - s/p afib ablation with PFA resulting in complete Pulmonary Vein, Posterior Wall and SVC isolation 09/02/23- recurrent AF post ablation, amiodarone  was initiated and s/p DCCV 9/17 and amio dose decreased to 200 mg daily- anticoagulated with eliquis  for  CHADS2vasc 3 and she denies issues with bleeding- ECG today continues to show SR - continue amiodarone  200 mg daily, metoprolol  150 mg daily and eliquis  5 mg bid- follow up when she returns from [place]  in MayCardiomyopathy/HFrEF- mild cardiomyopathy with LVEF 43% per echo 07/2022 thought related to AF with RVR- patient noted worsening dyspnea and activity intolerance and issues with bloating/abdominal distention ~3 months ago; noticing PND 3-4 nights weekly; improved with lasix - recent nuc stress testing showed 10% infarct, no ischemia and LVEF 27%- increase furosemide  to daily & instructed to call us  if she continues with PND- scheduled for cor angio & RHC 10/24; pending results/intervention will need referral to gen cards or AHF teamAmiodarone monitoring- QTc ok- elevated LFTs last month which improved with start lasix ; suspect due to hepato-congestion; repeat in 3 months (Dec)- normal TSH in May, will update in Nov/Dec- had CXR in March; will update when she returns from [facility] appreciate the opportunity to participate in the care of your patient. If you have any questions or concerns please feel free to contact our office.Sincerely,Chidi Shirer PETRINI Samay Delcarlo, NPUR MedicineCardiology Division  Electrophysiology I personally spent 60 minutes on the calendar day of the encounter, including pre and post visit work. 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 Tobie  s/p L THA 10/20/21 10/20/2021  Shortness of breath   Tobacco use  [2] Past Surgical History:Procedure Laterality Date  BONE MARROW TRANSPLANT  2017  Mediport insertion    PR ARTHRP ACETBLR/PROX FEM PROSTC AGRFT/ALGRFT Left 10/20/2021  Procedure: LEFT POSTERIOR ARTHROPLASTY, HIP, TOTAL;  Surgeon: Debrah Lot, MD;  Location: [facility];  Service: Orthopedics  PR ARTHRP ACETBLR/PROX FEM PROSTC AGRFT/ALGRFT Right 12/21/2022  Procedure: RIGHT POSTERIOR ARTHROPLASTY, HIP, TOTAL;  Surgeon: Debrah Lot, MD;  Location[facility];  Service: Orthopedics  PR AXILLARY LYMPHADENECTOMY COMPLETE Left 01/24/2021  Procedure: sentinel lymph node biopsy;  Surgeon: Patterson Caldron, MD;  Location: [facility];  Service: Oncology General  PR CARDIOVERSION ELECTIVE ARRHYTHMIA EXTERNAL N/A 09/24/2022  Procedure: Cardioversion;  Surgeon: Ana Warren CROME, NP;  Location: [facility] EP LABS;  Service: Cardiovascular  PR CARDIOVERSION ELECTIVE ARRHYTHMIA EXTERNAL N/A 09/25/2023  Procedure: Cardioversion;  Surgeon: Ana Warren CROME, NP;  Location: [facility] EP LABS;  Service: Cardiovascular  PR COMPRE EP EVAL ABLTJ ATR FIB PULM VEIN ISOLATION N/A 09/02/2023  Procedure: Ablation - A-Fib;  Surgeon: [name] SQUIBB, MD;  Location: [facility] EP LABS;  Service: Cardiovascular  PR MASTECTOMY PARTIAL Left 01/24/2021  Procedure: LEFT NEEDLE LOC PARTIAL MASTECTOMY   ;  Surgeon: Patterson Caldron, MD;  Location: [facility];  Service: Oncology General  ROTATOR CUFF REPAIR Right  [3] No Known Allergies (drug, envir, food or latex)

## 2023-10-16 ENCOUNTER — Ambulatory Visit

## 2023-10-16 ENCOUNTER — Encounter: Payer: Self-pay | Admitting: Cardiology

## 2023-10-17 ENCOUNTER — Other Ambulatory Visit: Payer: Self-pay

## 2023-10-17 ENCOUNTER — Telehealth: Payer: Self-pay | Admitting: Orthopedic Surgery

## 2023-10-17 MED ORDER — AMOXICILLIN 500 MG PO CAPS *I*
ORAL_CAPSULE | ORAL | 1 refills | Status: AC
Start: 2023-10-17 — End: ?

## 2023-10-17 NOTE — Progress Notes (Signed)
Prophylactic Amoxicillin order placed and sent to patient's pharmacy for upcoming scheduled dental procedure.

## 2023-10-17 NOTE — Telephone Encounter (Signed)
 Patient called - she had a right tha on 12/21/2022.She has a dental appointment on Monday, and she said she had to Pre Med due to her Cardiology issues.He pharmacy pd:[id number] [place], [place] - [address] AT [facility] [address] Phone: [phone] Fax: [phone]  Thanks

## 2023-10-18 ENCOUNTER — Encounter: Payer: Self-pay | Admitting: Internal Medicine

## 2023-10-18 ENCOUNTER — Telehealth: Payer: Self-pay

## 2023-10-18 NOTE — Telephone Encounter (Signed)
 Pt is calling because she wanted to speak to a nurse to get clarification on whether or not she can get her teeth cleaning on Monday, she was told she also has to take abx as well. She has a upcoming procedure with Dr. Gardenia. please advise.

## 2023-10-18 NOTE — Telephone Encounter (Signed)
 Left message on pts voicemail that she does not need antibiotics for routine dental cleanings based on her history of Afib and ablation. It does not appear she has any artificial heart valves either. She does have history of orthopedic surgeries, though, so I recommended she call her ortho provider and discuss if she needs prophylactic antibiotics from that perspective. Left callback number if she has any additional questions.

## 2023-10-22 ENCOUNTER — Other Ambulatory Visit: Payer: Self-pay | Admitting: Internal Medicine

## 2023-10-22 ENCOUNTER — Telehealth: Payer: Self-pay | Admitting: Cardiology

## 2023-10-22 NOTE — Telephone Encounter (Addendum)
 Called back relayed below message to pt will await Dr.Stuver's recommendations----Patel, Parag P, MD  Ok to hold day before and day of cath if needed per Dr. Gardenia ----- Message -----EP RNTo: Parag P, MDSubject: Eliquis  hold for Cardiac cath                Pt scheduled cardiac cath Dr Gardenia 10/24 at your requestPatient asks how many doses of Eliquis  to hold?    Jan

## 2023-10-22 NOTE — Telephone Encounter (Signed)
 Medication QuestionName of medication:   ELIQUIS  Dose: 5 MG TWICE DAILY What question or concern: patient is scheduled for a cardiac catheterization next Friday and she received the instructions for medications and it said hold Eliquis  for 1-2 days and she wanted to clarify how many days she should hold.   Please advise patient

## 2023-10-22 NOTE — Telephone Encounter (Signed)
 Lm for patient that I would check with Dr Tobie for Eliquis  hold and return her call.   JDeans RN

## 2023-10-27 LAB — EKG 12-LEAD
P: 54 deg
PR: 203 ms
QRS: 67 deg
QRSD: 89 ms
QT: 473 ms
QTc: 470 ms
Rate: 59 {beats}/min
T: 227 deg

## 2023-10-29 ENCOUNTER — Telehealth: Payer: Self-pay

## 2023-10-29 NOTE — Telephone Encounter (Signed)
 Will go for labs 10/22

## 2023-10-29 NOTE — Progress Notes (Unsigned)
 Medical Oncology [facility] FOLLOW-UP VISITPATIENT NAME:  Carly Higgins, Carly Higgins    DOB:  1960-06-19   MRN:  [id number]/[id number] DIAGNOSIS:  Clinical stage IIB left breast cancerREASON FOR VISIT: Carly Higgins is seen today for follow up and ongoing recommendations for management of stage IIB (cT2, cN0, cM0) grade 3, ER negative, PR negative, HER2 negative infiltrating ductal carcinoma of the left breast.   She is now on surveillance. She underwent left partial mastectomy and SLNB on January 24, 2021.  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  In February 2022 she began to experience discomfort in the left breast.  She first attributed this to having been struck with a part of her dog when she picked it up about 1 week earlier.  She continued to experience discomfort intermittently. She scheduled a screening mammogram, having not had an exam since July 2018.   IDC left 2 o'clock 2.5 cm, ER/PR- her 2- 05/18/2020 Significant Radiology Findings  Screening [place]: scattered tissue, architectural distortion left 2 o'clock. BIRAD 0 06/07/2020 Significant Radiology Findings  Ultrasound [place]: irregular hypoechoic mass left 2 o'clock, 19x19x12 mm, 25 mm radial. middle third upper outer quadrant of the left breast. BIRAD 4Ultrasound guided core IDC 06/09/2020 Procedure  ultrasound-guided core biopsy of the left breast 2:00 lesion: Biopsy revealed invasive ductal carcinoma.  Provisional combined Nottingham grade was 3 of 3 (tubule formation score 3, nuclear grade 3, mitotic rate score 2).  Tumor cells were negative for estrogen receptors and progesterone  receptors.  Tumor cells were negative for HER2 by immunohistochemical staining with a staining score of 1+.  Ki-67 staining was seen in approximately 70% of tumor cells.  06/13/2020 Consultation  She was seen in the Hereditary Oncology clinic :Because of her prior allogeneic bone marrow transplant, she was not eligible for standard germline testing with blood or saliva specimen.   06/15/2020 Initial Diagnosis  IDC left 2 o'clock 2.5 cm, ER/PR- her 2- 06/15/2020 Cancer Staged  Staging form: Breast, AJCC 8th Edition- Clinical stage from 06/15/2020: Stage IIB (cT2, cN0, cM0, G3, ER-, PR-, HER2-) 06/15/2020 Consultation  Surgical consultation:  Dr. Patterson  06/20/2020 Significant Radiology Findings  Whole-body bone scan : No abnormal focus of radiotracer uptake to suggest osseous metastatic disease. 06/22/2020 Significant Radiology Findings  CT of the chest: mill-defined enhancing lesion in the lateral left breast measuring 1.7 x 1.2 x 1.1 cm.  There were asymmetrically prominent level 1 left axillary lymph nodes, enhancement of uncertain significance, possibly representing metastatic disease. No enlarged mediastinal, hilar, intramammary,  supraclavicular lymph nodes or suspicious pulmonary nodules were identified.   06/22/2020 Significant Radiology Findings  CT of the abdomen pelvis : no evidence of metastatic disease . small fat filled umbilical hernia noted. 06/28/2020 Significant Radiology Findings  MRI of the breast: irregular mass with spiculated margins measuring 10 x 25 x 19 mm in the left breast upper outer quadrant at the 2 o'clock position located 3 cm from the nipple.  07/15/2020 - 12/08/2020 Chemotherapy  Began cycle 1 of weekly paclitaxel  and carboplatin  on July 15, 2020.  Immunotherapy with pembrolizumab was not recommended due to her previous allogenic bone marrow transplant.Final cycle (4) of paclitaxel  and carboplatin  was administered on September 30, 2020.Began dose dense cyclophosphamide  and doxorubicin  on  September 29, 2022OP Breast CARBOplatin  Weekly Paclitaxel  then [facility] Plan Provider: Redell JONETTA Conte, MDTreatment goal: Alphonsus of treatment: [No plan line of treatment] 08/18/2020 Procedure  PowerPort in the right internal jugular vein. 12/21/2020 Significant Radiology Findings  Left breast mammogram and ultrasound on December 21, 2020:Impression:  Mass in the left breast upper outer quadrant at 2 o'clock located 8 centimeters from the nipple is a known malignancy. Surgical consultation is recommended. Patient completed chemotherapy treatment on 12/08/2020. Patient is scheduled for left lumpectomy on 01/24/2021. Finding 2:  Normal lymph node and biopsy clip in the left axilla is benign. Finding 3:  Stable calcifications in the left breast are benign.  01/04/2021 Significant Radiology Findings  MRI breasts on December 15, 2020:  Previously seen irregular spiculated enhancing mass in the left breast does not demonstrate significant residual enhancement. A biopsy clip is noted along the medial margin of the previously seen mass.  There is no axillary or internal mammary lymphadenopathy. In the right breast, there is no evidence of any mass or suspicious enhancements.  Impression:  Area in the left breast is a known malignancy. No significant residual enhancement in the area of previously seen mass, consistent with excellent/complete radiologic response.  01/24/2021 Surgery  Wire localized partial mastectomy and sentinel lymph node biopsy- Jenkins Memory KunzeComplete pathologic response.  02/07/2021 Cancer Staged  Staging form: Breast, AJCC 8th Edition- Pathologic stage from 02/07/2021: No Stage Recommended (ypT0, pN0, cM0, GX) INTERVAL HISTORY:Audi was last seen in our office in May 20, 2023. Since that time:[place]  in 2 weeks, stays until April. Neuropathy in feet stable but comes and  goes. Nervive topical which takes the edge off. Gets nauseous at times. Zofran  ODT with relief maybe takes 3 times per week. Bowel movements stable constipation here and there. What procedure is scheduled for tomorrow? Angiogram tomorrow, SOB this past summer. Was placed on amiodarone  and has ben feeling better. Is on diuretic. About once every couple of days, 4 times a week.Mil dry cough. No chest pain or palpitations. Cardiac sx's:About one year ago she was found to be in atrial fibrillation.  She has been taking apixaban  twice daily since that time.  She  had also been taking metoprolol  ER 200 mg daily. low blood pressure readings at home? She denies palpitations.  She does experience windedness randomly for several seconds after which she feels fine.  She denies dizziness, lightheadedness, syncope, headaches.  She was supposed to follow up with PCP when she returned to [place]; plans to do so.She continues to report episodes of profuse sweating that she feels began after her radiation treatments(2023).  She discussed this with her PCP last year at which time thyroid  levels checked and she was started on levothyroxine .- no changes since. Otherwise, she feels she is doing well.  She has been able to be more active following bilateral hip replacements over the past 18 months.  She has no breast related concerns at this time.  Her appetite is good.  She reports ongoing intermittent morning nausea for which ondansetron  has been helpful.  She has experienced this since her BMT.  She has not vomited.  She denies diarrhea or constipation.  Her energy level has been pretty good.  She has not had any recent fevers or symptoms of infection.  She denies respiratory symptoms.  She is not experiencing any urinary symptoms.   Prior to beginning chemotherapy she had baseline numbness in her first toe bilaterally.  Neuropathy symptoms worsened in her feet with paclitaxel . (2022)  Today she  reports that she is still experiencing prickly discomfort and pain in her toes. No new bone pain. Some general aches and pains. ROS: As outlined above: comprehensive review of systems is otherwise negative. 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  Prior to bone marrow transplant she received 5 cycles of decitabine .  She received fludarabine , busulfan  and and hydroxyurea  for the transplant and received methotrexate  for graft-versus-host prophylaxis.Hypothyroid.Anxiety.Osteoarthritis of the hips, left greater than right.Mild diverticulosis of the sigmoid colon.History of atrial fibrillation and congestive heart failure in 2017 related to the bone marrow transplant.Right cataract surgery.Right rotator cuff surgery.MEDICATIONS: Current Outpatient Medications Medication Sig  levothyroxine  (SYNTHROID , LEVOTHROID) 50 mcg tablet TAKE 1 TABLET BY MOUTH EVERY DAY  atorvastatin  (LIPITOR) 40 mg tablet TAKE 1 TABLET BY MOUTH IN THE EVENING  amoxicillin (AMOXIL) 500 mg capsule for Dental prophylaxis. Take 4 pills 1 hour prior to dental work  furosemide  (LASIX ) 20 mg tablet Take 1 tablet (20 mg total) by mouth daily.  amiodarone  (PACERONE ) 200 mg tablet Take 1 tablet (200 mg total) by mouth daily for Atrial Fibrillation.  metoprolol  succinate ER (TOPROL -XL) 100 mg 24 hr tablet Take 1.5 tablets (150 mg total) by mouth daily for High Blood Pressure. Do not crush or chew. May be divided.  MOUNJARO  7.5 MG/0.5ML pen ADMINISTER 7.5 MG UNDER THE SKIN EVERY 7 DAYS (Patient not taking: Reported on 10/11/2023)  escitalopram  (LEXAPRO ) 10 mg tablet TAKE 1 TABLET(10 MG) BY MOUTH DAILY  ondansetron  (ZOFRAN -ODT) 4 MG disintegrating tablet Take 1 tablet (4 mg total) by mouth 3 times daily as needed. Place on top of tongue.  ELIQUIS   5 MG tablet TAKE 1 TABLET(5 MG) BY MOUTH EVERY 12 HOURS  acyclovir  (ZOVIRAX ) 400 mg tablet Take 1 tablet (400 mg total) by mouth 2 times daily for Infection caused by the Varicella Zoster Virus.  nystatin -triamcinolone  (MYCOLOG II) cream Apply topically 2 times daily as needed for Skin Infection due to Candida Yeast. Apply to affected area. Do not apply on or near surgical site.  Menthol , Topical Analgesic, (ICY HOT EX) Apply 1 applicator topically as needed (pain). OBJECTIVE FINDINGS:Vitals:  10/31/23 0955 BP: 112/76 Pulse: 72 Temp: 35.7 C (96.3 F) TempSrc: Temporal SpO2: 96% Weight: 107.8 kg (237 lb 10.5 oz)  Wt Readings from Last 3 Encounters: 10/11/23 111.1 kg (245 lb) 10/09/23 110.7 kg (244 lb) 09/25/23 111.6 kg (246 lb) Pain  10/31/23 0955 PainSc:   0 - No pain PHYSICAL EXAMINATION: General: Reveals a well-appearing woman; pleasant and engaged. ECOG performance status is 1.HEENT : grossly unremarkable; notable for regrowth of scalp hair.  Sclerae are anicteric. Conjunctivae are pink.  Lymph nodes: There is no palpable cervical, supraclavicular, axillary or inguinal lymph node enlargement. Chest: clear to auscultation and percussion.  PowerPort is present in the upper chest on the right; site is benign.  Cardiovacular: regularly irregular.  No murmurAbdomen: is soft,non tender  Bowel sounds are + all quadrants. No hepatosplenomegaly, mass or ascites detected . Extremities: are without edema. There is not evidence of lymphedema. Neurologic exam: is grossly nonfocal. Skin: is warm and dry without obvious rash, petechiae or ecchymoses. patches of vitiligo in the upper abdomen.Breasts: Comprehensive breast examination was performed in the seated and supine positions. The breasts are of moderate size and moderately ptotic/pendulous. The breasts are symmetric with normal shape and contour. Well healed lumpectomy incision upper outer left  breast.  The skin is without erythema, edema, nodules or other lesions. Nipples are normal and everted.There are no skin changes on arm maneuvers. On palpation there are no masses in either breast. No discharge is expressible from nipples. LABORATORY DATA: No blood work ordered for this visitIMAGING DATA: Bilateral mammogram on September 18, 2023 reveals no mammographic evidence of malignancy. Stable post treatment changes in the left breast. Routine follow-up mammogram in 1 year is recommended.***ASSESSMENT: Tanis is now 3 years post diagnosis of stage IIB triple negative left breast cancer and more than 2 years s/p left seed loc partial mastectomy with SLNBx; she had pathologic complete response after chemotherapy.  Based on review of systems, physical exam, and most recent breast imaging she is without obvious evidence to suggest recurrence of breast cancer at this time. About one year ago she was found to be in atrial fibrillation.  She has been taking apixaban  twice daily since that time.  She had also been taking metoprolol  ER 200 mg daily.  She reports that she was advised to stop this in March when she was hypotensive; noted when she was seen in urgent care (in [place] ) for upper respiratory symptoms.  Today she reports she has not resumed metoprolol  because she has still been getting low blood pressure readings at home.  She denies palpitations, dizziness, lightheadedness, syncope, headaches.  She does experience windedness randomly for several seconds after which she feels fine.  She was supposed to follow up with PCP when she returned to [place]; plans to do so.  Today I suggested that she proceed to [address] [facility]) for walk in ECG.  She declined this option today.  I encouraged her to reach out to her PCP and/or her cardiologist with the above information/symptoms.  I have also forwarded this information to her PCP.She continues to report episodes of profuse  sweating that she feels began after her radiation treatments(2023).  She discussed this with her PCP last year at which time thyroid  levels checked and she was started on levothyroxine .  She reports she is due for blood work to recheck levels.PLAN: Clinical stage IIB triple negative left breast cancerContinue surveillance Annual bilateral mammogram due September 2026Follow up in 6 monthsPeripheral neuropathyDid not receive benefit from acupuncture X 3She stopped Gabepentin (as prescribed by PCP); did not find effectiveAt her visit in May 2024, I referred her to the chemotherapy induced peripheral neuropathy study coordinator.  Today she reports that she was contacted by them but did not follow up since she was undergoing hip surgeries.  She plans to follow up with them now to see if she might be a candidate for one of the studies. Lucie Shelter, FNP-CInfusion Center & Medical Oncology  [facility] [address]  [place] [phone] clinic  [phone] fax

## 2023-10-29 NOTE — Patient Instructions (Addendum)
 Continue surveillance  Annual bilateral mammogram due September 2026Follow up here in 6 monthsContact office in the meantime with any questions or concerns.

## 2023-10-30 ENCOUNTER — Other Ambulatory Visit
Admission: RE | Admit: 2023-10-30 | Discharge: 2023-10-30 | Disposition: A | Discharge status: Home / Self Care | Source: Ambulatory Visit | Attending: Cardiology | Admitting: Cardiology

## 2023-10-30 DIAGNOSIS — I4819 Other persistent atrial fibrillation: Secondary | ICD-10-CM | POA: Insufficient documentation

## 2023-10-30 LAB — BASIC METABOLIC PANEL
Anion Gap: 12 (ref 7–16)
CO2: 26 mmol/L (ref 20–28)
Calcium: 10.2 mg/dL (ref 8.6–10.2)
Chloride: 101 mmol/L (ref 96–108)
Creatinine: 0.94 mg/dL (ref 0.51–0.95)
Glucose: 109 mg/dL — ABNORMAL HIGH (ref 60–99)
Lab: 23 mg/dL — ABNORMAL HIGH (ref 6–20)
Potassium: 5.4 mmol/L — ABNORMAL HIGH (ref 3.3–5.1)
Sodium: 139 mmol/L (ref 133–145)
eGFR BY CREAT: 68

## 2023-10-30 LAB — CBC
Hematocrit: 52 % — ABNORMAL HIGH (ref 34–49)
Hemoglobin: 15.8 g/dL (ref 11.2–16.0)
MCV: 90 fL (ref 75–100)
Platelets: 198 THOU/uL (ref 150–450)
RBC: 5.8 MIL/uL — ABNORMAL HIGH (ref 4.0–5.5)
RDW: 17 % — ABNORMAL HIGH (ref 0.0–15.0)
WBC: 5.6 THOU/uL (ref 3.5–11.0)

## 2023-10-30 LAB — CYSTATIN C WITH EGFR
Cystatin C: 1.4 mg/L — ABNORMAL HIGH (ref 0.6–1.0)
eGFR by Cystatin C: 46

## 2023-10-31 ENCOUNTER — Other Ambulatory Visit: Payer: Self-pay

## 2023-10-31 ENCOUNTER — Encounter: Payer: Self-pay | Admitting: Cardiology

## 2023-10-31 ENCOUNTER — Ambulatory Visit

## 2023-10-31 VITALS — BP 112/76 | HR 72 | Temp 96.3°F | Wt 237.7 lb

## 2023-10-31 DIAGNOSIS — Z7901 Long term (current) use of anticoagulants: Secondary | ICD-10-CM | POA: Insufficient documentation

## 2023-10-31 DIAGNOSIS — I499 Cardiac arrhythmia, unspecified: Secondary | ICD-10-CM | POA: Insufficient documentation

## 2023-10-31 DIAGNOSIS — Z853 Personal history of malignant neoplasm of breast: Secondary | ICD-10-CM | POA: Insufficient documentation

## 2023-10-31 DIAGNOSIS — N183 Chronic kidney disease, stage 3 unspecified: Secondary | ICD-10-CM | POA: Insufficient documentation

## 2023-10-31 DIAGNOSIS — C50412 Malignant neoplasm of upper-outer quadrant of left female breast: Secondary | ICD-10-CM | POA: Insufficient documentation

## 2023-10-31 DIAGNOSIS — R61 Generalized hyperhidrosis: Secondary | ICD-10-CM | POA: Insufficient documentation

## 2023-10-31 DIAGNOSIS — Z923 Personal history of irradiation: Secondary | ICD-10-CM | POA: Insufficient documentation

## 2023-10-31 DIAGNOSIS — G629 Polyneuropathy, unspecified: Secondary | ICD-10-CM | POA: Insufficient documentation

## 2023-10-31 DIAGNOSIS — Z08 Encounter for follow-up examination after completed treatment for malignant neoplasm: Secondary | ICD-10-CM | POA: Insufficient documentation

## 2023-10-31 DIAGNOSIS — Z171 Estrogen receptor negative status [ER-]: Secondary | ICD-10-CM | POA: Insufficient documentation

## 2023-10-31 NOTE — Progress Notes (Signed)
 Cath Lab / EP NP Note:Pt's GFR 68, creatinine 0.94 (eGFR 56 by creatinine and Cystatin C calculation) - Oct 30, 2023 meets criteria for IVF hydration protocol pre procedure.  However due to heart failure recent LVEF 27% and on diuretic, will not give.  Will determine need for IVF hydration post procedure when LVEDP is taken during coronary angiogram.   RAINELL CINDERELLA RATTLER, NP       10/31/2023  8:38 AM

## 2023-11-01 ENCOUNTER — Ambulatory Visit
Admission: RE | Admit: 2023-11-01 | Discharge: 2023-11-01 | Disposition: A | Discharge status: Home / Self Care | Payer: Self-pay | Source: Ambulatory Visit | Attending: Cardiology | Admitting: Cardiology

## 2023-11-01 ENCOUNTER — Telehealth: Payer: Self-pay

## 2023-11-01 ENCOUNTER — Ambulatory Visit: Payer: Self-pay | Admitting: Cardiology

## 2023-11-01 ENCOUNTER — Encounter
Admission: RE | Disposition: A | Discharge status: Home / Self Care | Payer: Self-pay | Source: Ambulatory Visit | Attending: Cardiology

## 2023-11-01 DIAGNOSIS — I4811 Longstanding persistent atrial fibrillation: Secondary | ICD-10-CM

## 2023-11-01 DIAGNOSIS — E119 Type 2 diabetes mellitus without complications: Secondary | ICD-10-CM | POA: Insufficient documentation

## 2023-11-01 DIAGNOSIS — Z87891 Personal history of nicotine dependence: Secondary | ICD-10-CM | POA: Insufficient documentation

## 2023-11-01 DIAGNOSIS — I428 Other cardiomyopathies: Secondary | ICD-10-CM | POA: Clinically undetermined

## 2023-11-01 DIAGNOSIS — N183 Chronic kidney disease, stage 3 unspecified: Secondary | ICD-10-CM | POA: Insufficient documentation

## 2023-11-01 DIAGNOSIS — I4891 Unspecified atrial fibrillation: Secondary | ICD-10-CM | POA: Diagnosis present

## 2023-11-01 DIAGNOSIS — I5020 Unspecified systolic (congestive) heart failure: Secondary | ICD-10-CM | POA: Diagnosis present

## 2023-11-01 DIAGNOSIS — Z9889 Other specified postprocedural states: Secondary | ICD-10-CM

## 2023-11-01 DIAGNOSIS — I4819 Other persistent atrial fibrillation: Secondary | ICD-10-CM | POA: Insufficient documentation

## 2023-11-01 DIAGNOSIS — I2510 Atherosclerotic heart disease of native coronary artery without angina pectoris: Secondary | ICD-10-CM | POA: Insufficient documentation

## 2023-11-01 DIAGNOSIS — Z7901 Long term (current) use of anticoagulants: Secondary | ICD-10-CM | POA: Insufficient documentation

## 2023-11-01 DIAGNOSIS — Z7984 Long term (current) use of oral hypoglycemic drugs: Secondary | ICD-10-CM | POA: Insufficient documentation

## 2023-11-01 HISTORY — PX: PR L HRT CATH W/NJX L VENTRICULOGRAPHY IMG S&I: 93452

## 2023-11-01 HISTORY — PX: CARDIAC CATHETERIZATION: SHX172

## 2023-11-01 HISTORY — PX: PR CATH PLMT R HRT & ARTS W/NJX & ANGIO IMG S&I: 93456

## 2023-11-01 LAB — POCT GLUCOSE
Glucose POCT: 97 mg/dL (ref 60–99)
Glucose POCT: 81 mg/dL (ref 60–99)

## 2023-11-01 LAB — POTASSIUM: Potassium: 4.9 mmol/L (ref 3.3–5.1)

## 2023-11-01 SURGERY — CORONARY ANGIOGRAM WITH RIGHT HEART CATHETERIZATION
Anesthesia: Moderate Sedation

## 2023-11-01 MED ORDER — HEPARIN SODIUM (PORCINE) 1000 UNIT/ML IJ SOLN *WRAPPED*
Status: DC | PRN
Start: 2023-11-01 — End: 2023-11-01
  Administered 2023-11-01: 5000 [IU] via INTRAVENOUS

## 2023-11-01 MED ORDER — OXYCODONE HCL 5 MG PO TABS *I*
5.0000 mg | ORAL_TABLET | Freq: Once | ORAL | Status: AC
Start: 2023-11-01 — End: 2023-11-01
  Administered 2023-11-01: 5 mg via ORAL

## 2023-11-01 MED ORDER — LIDOCAINE HCL 1 % IJ SOLN *I*
INTRAMUSCULAR | Status: AC
Start: 2023-11-01 — End: 2023-11-01
  Filled 2023-11-01: qty 10

## 2023-11-01 MED ORDER — MIDAZOLAM HCL 1 MG/ML IJ SOLN *I* WRAPPED
INTRAMUSCULAR | Status: AC
Start: 2023-11-01 — End: 2023-11-01
  Filled 2023-11-01: qty 2

## 2023-11-01 MED ORDER — IOHEXOL 350 MG/ML (OMNIPAQUE) IV SOLN *I*
INTRAVENOUS | Status: DC | PRN
Start: 2023-11-01 — End: 2023-11-01
  Administered 2023-11-01: 76 mL via INTRAVENOUS

## 2023-11-01 MED ORDER — HEPARIN SODIUM (PORCINE) 1000 UNIT/ML IJ SOLN *WRAPPED*
Status: AC
Start: 2023-11-01 — End: 2023-11-01
  Filled 2023-11-01: qty 10

## 2023-11-01 MED ORDER — MIDAZOLAM HCL 1 MG/ML IJ SOLN *I* WRAPPED
INTRAMUSCULAR | Status: DC | PRN
Start: 2023-11-01 — End: 2023-11-01
  Administered 2023-11-01: 2 mg via INTRAVENOUS

## 2023-11-01 MED ORDER — SPIRONOLACTONE 25 MG PO TABS *I*
25.0000 mg | ORAL_TABLET | Freq: Every day | ORAL | 0 refills | Status: DC
Start: 2023-11-01 — End: 2023-11-26

## 2023-11-01 MED ORDER — EMPAGLIFLOZIN 10 MG PO TABS *I*
10.0000 mg | ORAL_TABLET | Freq: Every morning | ORAL | 0 refills | Status: DC
Start: 2023-11-01 — End: 2023-11-26

## 2023-11-01 MED ORDER — FENTANYL CITRATE 50 MCG/ML IJ SOLN *WRAPPED*
INTRAMUSCULAR | Status: DC | PRN
Start: 2023-11-01 — End: 2023-11-01
  Administered 2023-11-01: 25 ug via INTRAVENOUS

## 2023-11-01 MED ORDER — FENTANYL CITRATE 50 MCG/ML IJ SOLN *WRAPPED*
INTRAMUSCULAR | Status: AC
Start: 2023-11-01 — End: 2023-11-01
  Filled 2023-11-01: qty 2

## 2023-11-01 MED ORDER — OXYCODONE HCL 5 MG PO TABS *I*
ORAL_TABLET | ORAL | Status: AC
Start: 2023-11-01 — End: 2023-11-01
  Filled 2023-11-01: qty 1

## 2023-11-01 SURGICAL SUPPLY — 14 items
CATH BLN WEDGE 6FR 110CM (Catheter) ×1 IMPLANT
CATH EXPO 5F PIG 145 (Catheter) ×1 IMPLANT
CATH OPTI RADI 5F TIG 4.0 (Catheter) ×1 IMPLANT
DEVICE COMPR L24CM REG RAD TR BND (Closure Device) ×2 IMPLANT
ELECTRODE DEFIBRILLATOR ADULT MULTIFUNCTION EDGE SYSTEM RTS (Supply) ×1 IMPLANT
GUIDEWIRE TSCF-35-145-3 (Guidewire) ×1 IMPLANT
KIT ANGIO MFLD PT BT2000 W/ TRNSDUC (Supply) ×1 IMPLANT
KIT HAND CONTROL 65IN TUBING ACIST ANGIOTOUCH ATX65 (Supply) ×1 IMPLANT
KIT NEEDLE GUIDE SCANNER 18G X 1.5-3.5CM (Supply) ×1 IMPLANT
PACK CUSTOM CARDIAC CATH PACK (Pack) ×1 IMPLANT
SET MICROPUNCTURE STIFF NITI 5FRX10X40CM (Sheath) ×1 IMPLANT
SHEATH GLIDESHEATH 5FR P172577 (Sheath) ×1 IMPLANT
SHEATH GLIDESHEATH 6FR 10CM (Sheath) ×1 IMPLANT
SHIELD RADPAD INTERVENTIONAL (Supply) ×1 IMPLANT

## 2023-11-01 NOTE — H&P (Signed)
 UPDATES TO PATIENT'S CONDITION on the DAY OF SURGERY/PROCEDUREI. Updates to Patient's Condition (to be completed by a provider privileged to complete a H&P, following reassessment of the patient by the provider):Day of Surgery/Procedure Update:HistoryHistory reviewed and no changePhysicalPhysical exam updated and no change  II. Procedure Readiness I have reviewed the patient's H&P and updated condition. By completing and signing this form, I attest that this patient is ready for surgery/procedure.III. Attestation I have reviewed the updated information regarding the patient's condition and it is appropriate to proceed with the planned surgery/procedure.Debby SHAUNNA Sarah, MD as of 9:18 AM 11/01/2023

## 2023-11-01 NOTE — Progress Notes (Signed)
 Report Given  Post op RNProcedure SummaryProcedure: Coronary Angio Baseline Neuro Status: A & O x3, speech clear, moving all extremities Intervention/Results: see cardiology notes Access: 5 fr RRA 27fr RBVHemostasis Type and Time: 5 fr sheath pulled and TR band applied to R wrist (17cc in band placed @ 0900). RRA site soft, no bleeding, good pleth, and no hematoma. RBV sheath pulled manual pressure @ 0905Sedation Type and Amt: 2 mg Versed ,25 mcg Fentanyl  Additional Meds in Procedure:  5000 units Heparin ,76 ml dyeActive Issues/Relevant EventsPost Procedure Neuro Status: unchangedIn Lab Complications:noneConcerns:nonePost Procedure Hydration:noneTo Do ListPost sedation monitoringTR band precautionsBedrest: 1 hrAnticipatory Guidance/Discharge PlanningReport given, sites checked, no issues. Rollene Babe, RN

## 2023-11-01 NOTE — Telephone Encounter (Signed)
 Patient has been scheduled for Monday 10/27 10:30am with Dr. Jori at [facility]. Cath lab has notified patient of appointment.

## 2023-11-01 NOTE — Progress Notes (Signed)
 TR band off without bleeding. IV removed. Discharge info given. Pt/Caregiver verbalized understanding. Pt is alert & oriented x4 and ambulatory/independent. Pt and family both are escorted out to the pt discharge. Cy Cramp, RN

## 2023-11-01 NOTE — Discharge Instructions (Addendum)
  HospitalCardiac Catheterization LabPatient Discharge InstructionsAttending Physician: Dr. Debby Sarah                              Procedure Date: 10/24/2025Procedures: Right Heart CatheterizationCoronary AngiogramLV gram showed low EF 30%RECOMMENDED DIETLow sodium, low cholesterol, and low sugar diet.A healthy diet is important to help you stay well. Some health conditions require you to be on a special diet. For example, if you have heart failure you should monitor your fluid intake and limit the amount of sodium including table salt. This will help you avoid fluid retention which can cause shortness of breath or swelling of the feet and ankles. Reading food labels is helpful when you are on a special diet. Follow instructions from your doctor for any other special dietary requirement.Recommended physical activity: Your procedures were performed through your radial artery (wrist) and brachial vein (inner elbow):  *Do not lift, push, or pull anything (e.g. shoveling, mowing, raking, vacuuming) more than 10 pounds for 5 days.*Do not submerge the affected hand/arm in water  for 5 days.*No gripping with your hand/typing for 24 hours.*No blood pressures, IV's, or lab draws in the affected arm for 24 hours.If you were driving prior to your procedure do not drive for 2 days.You may resume sexual activity when able to walk up a flight of stairs without difficulty.Wound Care:A shower is permitted 24 hours after your procedureRemove the dressing/band-aid before showering and allow the water  to run over the puncture site.Clean the site daily, for the next 5 days, with soap and water . Gently pat dry.  Place a clean band-aid over the site after washing until puncture site is healed. It is normal to have:    1.  A lump at the puncture site. The lump can range from the size of a pea to the size of a walnut. This lump will slowly go away over the  next month.    2.  Bruising around the site. The bruise will go though many color changes.  It may take several weeks to completely go away.    3.  Soreness, which will improve within a few days. Not Normal:Sudden bright red bleeding or new firmness at the puncture site. If this occurs, apply firm pressure to the site and call 911.    PAIN MANAGEMENTYou may take Acetaminophen  (Tylenol ) for pain.MEDICATIONS:Resume your previous medications as outlined in the medication section of your discharge instructions. Restart Eliquis  tomorrow morning. Do not take tonight to prevent arm bleeding.Start Jardiance 10mg  daily.Start Spironolactone 25mg  daily.Hold furosemide  for now. LABS:Get bloodwork in 5-7 days. DAILY WEIGHTFor some conditions (such as heart failure) you will need to weigh yourself every day at the same time on the same scale, preferably after you empty your bladder. If you have an increase of 3 pounds in 2 days or 5 pounds in 1 week you should contact your physician. A daily written log of your weights is a good way to keep track. You should follow your doctor's recommendations on monitoring your weight.DIABETES MANAGEMENTBlood sugar management is important to prevent progression of coronary artery disease. We suggest you follow-up closely with your primary care doctor and/or your endocrinologist if you are having difficulty with managing your blood sugars. SMOKING CESSATIONSmoking can increase your chances of developing chronic health problems or worsen conditions you already have. If you smoke you should quit. Medications to help you quit are available. Ask your doctor if you would like to receive these  medications.KIDNEY FUNCTIONAs part of the procedure today you received IV contrast dye that can affect how your kidneys work. We encourage you to drink fluids (specifically water ) to flush out your kidneys for the next 2 days. FOLLOW-UPWithin the  first 24 hoursNotify the cardiac catheterization doctor on call at [phone] if you experience any of the following symptoms: chest pain, shortness of breath, lightheadedness, excessive wrist or groin pain, signs of infection such as fever >101.3, redness, warmth, excessive tenderness within the first 24 hours. After 24 hoursIf you experience chest pain, shortness of breath, or other problems after 24 hours of hospital discharge call Ty Laneta MATSU, MD at [phone].If you have a procedure-related issue after 24 hours, call Dr. Gardenia at [phone].FOLLOW-UP APPOINTMENTWith Dr. Tobie on 05/29/2024 when you return from [place] .Dr. Tobie recommends you update an echocardiogram in the next 1-2 weeks and refer you to the heart failure clinic for expert medical management. Provider Signature: Rocky JAYSON Barks, NP

## 2023-11-01 NOTE — Result Encounter Note (Signed)
 Met with patient after angiogram.  She appears to have nonischemic cardiomyopathy despite maintenance of SR.  Will plan to refer patient for outpatient advanced CHF evaluation prior to her winter in [place] .

## 2023-11-01 NOTE — Progress Notes (Signed)
 Assumed pt care. Patient arrived to recovery area s/p LHC and RHC. Pt is A&Ox4 and VSS post procedure. Currently denying any pain or discomfort. RRA RBV site CDI. Plan for SDDC once recovery complete. Family updated regarding discharge plan and at bedside.  Cy Cramp, RN

## 2023-11-01 NOTE — INTERIM OP NOTE (Signed)
 Interim Procedure Note      Date of Procedure: 11/01/23     Operators: Surgeons and Role:   * Stuver, Debby SQUIBB, MD - Primary   DEWAINE Dany Barter, NP - Assisting     Pre-procedure Diagnosis: Pre-Op Diagnosis Codes:    * Persistent atrial fibrillation [I48.19]     Post-procedureDiagnosis: Post-Op Diagnosis Codes:   * Persistent atrial fibrillation [I48.19]     Procedure(s) Performed:  Procedure(s) (LRB):Coronary Angiogram with Right Heart Catheterization (N/A)Left Heart Cath (N/A)Left Ventriculography (N/A)     Findings: RA: 6/3/5PA: 30/10/18PCW: 10/11/9CI: 2.2 FICK, SvO2 71%Mild mRCA plaqueMild diffuse LAD and LCx diseaseLVEDP - LV-Gram - global hypokinesis est 30%   Intervention(s): None    Anesthesia Type: Moderate sedation     Access/Closure: Right radial artery (5 Fr)Right antecubital vein (6 Fr)Artery hemostatic with 17cc TR band Pleth good     Contrast used: 76 mls     Estimated Blood Loss: <10 mls     Post Unit: Holding area / Home        Patient Condition: good     Complications: none   PLAN Hydration: PO hydration Bedrest: 2 hoursAntithrombotic regimen: Restart Eliquis  tomorrow am   Followed patient to the [place] area Signed:Barter Dany, NP on 11/01/2023 at 9:06 AM

## 2023-11-04 ENCOUNTER — Other Ambulatory Visit: Payer: Self-pay

## 2023-11-04 ENCOUNTER — Encounter: Payer: Self-pay | Admitting: Transplant

## 2023-11-04 ENCOUNTER — Ambulatory Visit: Attending: Transplant | Admitting: Transplant

## 2023-11-04 VITALS — BP 108/74 | HR 71 | Ht 66.0 in | Wt 237.0 lb

## 2023-11-04 DIAGNOSIS — I480 Paroxysmal atrial fibrillation: Secondary | ICD-10-CM | POA: Insufficient documentation

## 2023-11-04 DIAGNOSIS — Z8679 Personal history of other diseases of the circulatory system: Secondary | ICD-10-CM | POA: Insufficient documentation

## 2023-11-04 DIAGNOSIS — E119 Type 2 diabetes mellitus without complications: Secondary | ICD-10-CM | POA: Insufficient documentation

## 2023-11-04 DIAGNOSIS — I5022 Chronic systolic (congestive) heart failure: Secondary | ICD-10-CM | POA: Insufficient documentation

## 2023-11-04 DIAGNOSIS — I340 Nonrheumatic mitral (valve) insufficiency: Secondary | ICD-10-CM | POA: Insufficient documentation

## 2023-11-04 DIAGNOSIS — I428 Other cardiomyopathies: Secondary | ICD-10-CM | POA: Insufficient documentation

## 2023-11-04 DIAGNOSIS — Z9889 Other specified postprocedural states: Secondary | ICD-10-CM | POA: Insufficient documentation

## 2023-11-04 DIAGNOSIS — Z7901 Long term (current) use of anticoagulants: Secondary | ICD-10-CM | POA: Insufficient documentation

## 2023-11-04 MED ORDER — VALSARTAN 40 MG PO TABS *I*: 20.0000 mg | ORAL_TABLET | Freq: Two times a day (BID) | ORAL | 2 refills | Status: DC

## 2023-11-04 NOTE — Patient Instructions (Addendum)
 Lets start valsartan 20 mg twice a day. Monitor blood pressure at home. Monitor weight at home. BMP end of this week. Please find an imaging facility (for cardiac MRI) and let me know the name so that we can fax the requets. I will reach out to your oncology to get more information about dosage of chemotherapy that you have had.

## 2023-11-04 NOTE — Progress Notes (Signed)
 Heart Failure and Transplant Service - Outpatient Progress Note:CHIEF COMPLAINT:  Chief Complaint Patient presents with  New Patient Visit  Cardiomyopathy Interval HistoryMs. Carly Higgins 63 y.o. female, who comes to clinic today to establish care given a history as follows: Chronic systolic heart failureMild to moderate mitral regurgitationPersistent atrial fibrillation s/p DCCV, ablation with recurrenceMyeloprolipherative neoplasm s/p unrelated donor allogenic bone marrow transplant 5/2017HER2 neg infiltrating ductal carcinoma of the left breast s/p partial left mastectomy and SLNB,  s/p doxorubicin  and cyclophosphamide  following paclitaxel  and carboplatin ; s/p RT DM2 A1C 5.8 History of Present IllnessThe patient is a female who presents for heart failure.She was diagnosed with atrial fibrillation in 05/2022, which she initially mistook for a normal heart rhythm. A few months ago, she experienced a high heart rate and shortness of breath, to the point where she could not bend over or sleep due to difficulty breathing. She believes these symptoms were present during her radiation therapy but is uncertain. She underwent an echocardiogram in 07/2022, around the same time as her atrial fibrillation diagnosis. She has undergone cardioversion twice, had an ablation this year, and has had a stress test and angiogram. She reports feeling well overall but experiences fatigue and excessive sweating, which she attributes to her radiation therapy. She does not experience shortness of breath during physical activity and maintains a good appetite. She has not experienced recent orthopnea or leg swelling. She experienced bloating at the end of 07/2023, which has since resolved. She reports no lightheadedness, dizziness, or syncope. Her blood pressure is typically normal. She has not undergone a cardiac MRI. She occasionally smokes and drinks alcohol. She is considering  increasing her physical activity, including climbing stairs. She is planning to travel to [place]  in less than 2 weeks and will be staying there until the end of 04/2024. She has not seen her oncologist in over a year and is due for a follow-up.She has a history of cancer, including a bone marrow transplant in 2017 and breast cancer treated with partial left mastectomy, chemotherapy, and radiation. She has undergone 2 hip surgeries, which have been successful. She has a history of MRSA infection and bronchitis last year.She is on Mounjaro  7.5 mg once a week, which has been helping her lose weight. She feels a little nauseous from the Mounjaro  but is okay with it because she wants to lose weight. She was never diabetic before but thinks her diabetes is controlled now. She tries to stay away from sweets and a lot of carbs but does like her meat. 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 It is not as bad right now.PAST SURGICAL HISTORY:Bone marrow transplant in 2017Partial left mastectomyTwo hip surgeriesSOCIAL HISTORYMarital Status: DivorcedExercise: Plans to walk daily for 40 minutes [place] in [place]iet: Avoids sweets and high-carb foods, enjoys steak once a weekAlcohol: Does not drink regularly but may have a drink occasionallyTobacco: Smokes occasionallyCoffee/Tea/Caffeine-containing Drinks: Drinks coffee, sometimes strongFAMILY HISTORY- Father: Heart attack in his 37s or 58s.- Brother: Widowmaker heart attack with 95% of arteries clogged, stent placed.- Negative for family history of sudden death. Focused Review of Systems All other systems reviewed and are negative.Alcohol:  reports that she does not currently use alcohol.Tobacco:  reports that she quit smoking about 2 months ago. Her smoking use included cigarettes. She started smoking about 40 years ago. She has a 16 pack-year smoking  history. She has been exposed to tobacco smoke. She has never used smokeless tobacco.Drug:  reports no history of drug use.Objective  VITAL SIGNS:BP 108/74 (BP Location: Left arm, Patient Position: Sitting, Cuff Size: large adult) Comment: manual bp  Pulse 71   Ht 1.676 m (5' 6)   Wt 107.5 kg (237 lb)   SpO2 96%   BMI 38.25 kg/m  FOCUSED PHYSICAL EXAM:Constitutional:     Appearance: Healthy appearance. Not in distress. Eyes:    Conjunctiva/sclera: Conjunctivae normal. Neck:    Vascular: JVD normal. Pulmonary:    Effort: Pulmonary effort is normal.    Breath sounds: Normal breath sounds. Cardiovascular:    PMI at left midclavicular line. Normal rate. Regular rhythm.    Murmurs: There is no murmur. Edema:   Peripheral edema absent. Abdominal:    General: Bowel sounds are normal.    Palpations: Abdomen is soft. Skin:   General: Skin is warm and dry. Neurological:    Mental Status: Alert and oriented to person, place and time.   Summary of Prior ECHO's:LVEF (%) Date Value 10/09/2023 27 07/27/2022 43 07/05/2020 65 05/29/2015 60 04/28/2015 61  No results found for: EF Summary of other Prior Cardiac Testing EKG 10/11/23: SR, borderline low voltage limb leads, anterolateral TWISPECT MPI 10/09/23: Abnormal spect MPI. Dilated hypertrophied LV with stress ad rest perfusion defects consistent with extensive infarction 10% LV of the apex and anterioseptum. LVEF 27%LHC 11/01/23: Mild non obstructive coronary artery disease. Normal LVEDPFick CO/CI 4.8/2.1Normal filling pressures. Normal LVEDPEchocardiogram 07/2022: A.fib with RVRConcentric hypertrophy. LVEF is 43%. Mild to mod MR. Mild TR. Asc aorta is borderline enlarged. At 3.7cm. Comapred to prior echo LVEF is reduced to 43% from 65%. LVPW 1, IVS 1.1. LVEDD 4.7 Medications  Current Medications[1]Allergies[2]  Labs :Lab Results  Component Value Date/Time  WBC 5.6 10/30/2023 08:26 AM  HGB 15.8 10/30/2023 08:26 AM  HCT 52 (H) 10/30/2023 08:26 AM  PLT 198 10/30/2023 08:26 AM Lab Results Component Value Date/Time  NA 139 10/30/2023 08:26 AM  K 4.9 11/01/2023 07:47 AM  CL 101 10/30/2023 08:26 AM  CO2 26 10/30/2023 08:26 AM  UN 23 (H) 10/30/2023 08:26 AM  CREAT 0.94 10/30/2023 08:26 AM  CREAT 0.87 07/03/2016 11:05 AM  GFRC 80 11/05/2019 02:51 PM  GFRB 92 11/05/2019 02:51 PM  GLU 109 (H) 10/30/2023 08:26 AM  PGLU 81 11/01/2023 09:19 AM  CA 10.2 10/30/2023 08:26 AM  MG 1.9 09/18/2023 09:45 AM  TP 5.9 (L) 09/24/2023 10:25 AM  ALB 4.1 09/24/2023 10:25 AM  PALB 9 (L) 06/06/2015 02:06 AM  TB 1.1 09/24/2023 10:25 AM  DB <0.2 05/20/2023 10:50 AM  IBILI 0.7 05/29/2015 12:28 AM  AST 42 (H) 09/24/2023 10:25 AM  ALT 63 (H) 09/24/2023 10:25 AM  ALK 92 09/24/2023 10:25 AM Lab Results Component Value Date/Time  INR 1.2 (H) 11/23/2022 10:11 AM  PTT 32.9 11/23/2022 10:11 AM  Assessment & Plan [name] is a 63 y.o. 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 Carlis is doing well since restoration of SR and initiation of amiodarone . She has NYHA class II symptoms. Etiology of HF could be multifactorial. It is unclear the amount of doxorubicin  she has received. I will discuss  with her oncologist but likely could the reason of cardiomyopathy. She has 10% scar on nuclear stress test. We will obtain cardiac MRI to  further quantify the amount of scar and r/o infiltrative disease. She has borderline low voltage and IVS is 1.1 (lower suspicion). She has no family hx of cardiomyopathy to suspect genetic cardiomyopathy however it cannot be ruled out. I discussed with Treniece about HF disease, prognosis and treatment. We discussed about HF drugs and their positive effects. I will start her on low dose valsartan. I recommended her to monitor BP and weight at home. 1. Chronic systolic heart failure (Primary)2. NICM (nonischemic cardiomyopathy)3. Paroxysmal atrial fibrillation4. Chronic anticoagulation5. Moderate mitral regurgitation6. S/P ablation of atrial fibrillation7. Type 2 diabetes mellitus without complication, without long-term current use of insulinHeart Failure Treatment:  ACE Inhibitor/ARB: Yes    ARNI: No     Beta blocker:  Yes    Aldosterone antagonist: Yes    SGLT2 Inhibitor: Yes    HCN CHANNEL BLOCKER No    ICD: No ICD/NO Lifevest  Manufacturer: N/A    UNOS Status Not Listed for orthotopic heart transplant NYHA Functional Class = II- Slight limitations with ordinary activity AHA/ACC Cardiomyopathy Stage = C-  Moderate Structural Heart Disease with Heart Failure symptomsOther Treatment:Cholesterol monitoring: Next due next labs Advanced directives: HCP on file yes   MOLST on file noAnticoagulation: apixiban (Eliquis ) 5mg  twice daily Patient Instructions Lets start valsartan 20 mg twice a day. Monitor blood pressure at home. Monitor weight at home. BMP end of this week. Please find an imaging facility (for cardiac MRI) and let me know the name so that we can fax the requets. I will reach out to your oncology to get more information about dosage of chemotherapy that you have had. We appreciate the opportunity to participate in the care of [name].  Please do not hesitate to contact us  for any questions or concerns  (Main office number [phone]).I personally spent 90 minutes on the calendar day of the encounter, including pre and post visit work. The time spent is independent of time interrogating cardiac devices, which include but are not limited to LVAD or ICD.Note created: 11/04/2023  at: 12:57 PM  Author: Alan Mulders, MD   [1] Current Outpatient Medications:   empagliflozin (JARDIANCE) 10 mg tablet, Take 1 tablet (10 mg total) by mouth every morning for Heart Failure., Disp: 30 tablet, Rfl: 0  spironolactone (ALDACTONE) 25 mg tablet, Take 1 tablet (25 mg total) by mouth daily for Left Systolic Heart Failure., Disp: 30 tablet, Rfl: 0  levothyroxine  (SYNTHROID , LEVOTHROID) 50 mcg tablet, TAKE 1 TABLET BY MOUTH EVERY DAY, Disp: 90 tablet, Rfl: 5  atorvastatin  (LIPITOR) 40 mg tablet, TAKE 1 TABLET BY MOUTH IN THE EVENING, Disp: 90 tablet, Rfl: 5  amiodarone  (PACERONE ) 200 mg tablet, Take 1 tablet (200 mg total) by mouth daily for Atrial Fibrillation., Disp: 90 tablet, Rfl: 3  metoprolol  succinate ER (TOPROL -XL) 100 mg 24 hr tablet, Take 1.5 tablets (150 mg total) by mouth daily for High Blood Pressure. Do not crush or chew. May be divided., Disp: , Rfl:   MOUNJARO  7.5 MG/0.5ML pen, ADMINISTER 7.5 MG UNDER THE SKIN EVERY 7 DAYS, Disp: 2 mL, Rfl: 2  escitalopram  (LEXAPRO ) 10 mg tablet, TAKE 1 TABLET(10 MG) BY MOUTH DAILY, Disp: 90 tablet, Rfl: 1  ELIQUIS  5 MG tablet, TAKE 1 TABLET(5 MG) BY MOUTH EVERY 12 HOURS, Disp: 180 tablet, Rfl: 3  acyclovir  (ZOVIRAX ) 400 mg tablet, Take 1  tablet (400 mg total) by mouth 2 times daily for Infection caused by the Varicella Zoster Virus., Disp: 180 tablet, Rfl: 3  valsartan (DIOVAN) 40 mg tablet, Take 0.5 tablets (20 mg total) by mouth 2 times daily., Disp: 30 tablet, Rfl: 2  amoxicillin (AMOXIL) 500 mg capsule, for Dental prophylaxis. Take 4 pills 1 hour prior to dental work, Disp: 4 capsule, Rfl: 1  furosemide  (LASIX ) 20 mg tablet, Take 1  tablet (20 mg total) by mouth daily. (Patient not taking: Reported on 11/04/2023), Disp: 90 tablet, Rfl: 3  ondansetron  (ZOFRAN -ODT) 4 MG disintegrating tablet, Take 1 tablet (4 mg total) by mouth 3 times daily as needed. Place on top of tongue., Disp: 30 tablet, Rfl: 2  nystatin -triamcinolone  (MYCOLOG II) cream, Apply topically 2 times daily as needed for Skin Infection due to Candida Yeast. Apply to affected area. Do not apply on or near surgical site., Disp: , Rfl:   Menthol , Topical Analgesic, (ICY HOT EX), Apply 1 applicator topically as needed (pain)., Disp: , Rfl: [2] No Known Allergies (drug, envir, food or latex)

## 2023-11-05 ENCOUNTER — Encounter: Payer: Self-pay | Admitting: Cardiology

## 2023-11-05 NOTE — Addendum Note (Signed)
 Addended by: JORI PALMA on: 11/05/2023 12:17 PM Modules accepted: Level of Service

## 2023-11-11 ENCOUNTER — Other Ambulatory Visit
Admission: RE | Admit: 2023-11-11 | Discharge: 2023-11-11 | Disposition: A | Discharge status: Home / Self Care | Source: Ambulatory Visit

## 2023-11-11 ENCOUNTER — Ambulatory Visit: Payer: Self-pay | Admitting: Cardiology

## 2023-11-11 ENCOUNTER — Other Ambulatory Visit: Payer: Self-pay

## 2023-11-11 ENCOUNTER — Ambulatory Visit
Admission: RE | Admit: 2023-11-11 | Discharge: 2023-11-11 | Disposition: A | Discharge status: Home / Self Care | Source: Ambulatory Visit | Attending: Internal Medicine | Admitting: Internal Medicine

## 2023-11-11 DIAGNOSIS — I4811 Longstanding persistent atrial fibrillation: Secondary | ICD-10-CM

## 2023-11-11 DIAGNOSIS — I4819 Other persistent atrial fibrillation: Secondary | ICD-10-CM

## 2023-11-11 DIAGNOSIS — I4892 Unspecified atrial flutter: Secondary | ICD-10-CM

## 2023-11-11 DIAGNOSIS — I5022 Chronic systolic (congestive) heart failure: Secondary | ICD-10-CM | POA: Insufficient documentation

## 2023-11-11 DIAGNOSIS — I428 Other cardiomyopathies: Secondary | ICD-10-CM | POA: Insufficient documentation

## 2023-11-11 DIAGNOSIS — Z8679 Personal history of other diseases of the circulatory system: Secondary | ICD-10-CM | POA: Insufficient documentation

## 2023-11-11 DIAGNOSIS — I4891 Unspecified atrial fibrillation: Secondary | ICD-10-CM | POA: Insufficient documentation

## 2023-11-11 DIAGNOSIS — Z9889 Other specified postprocedural states: Secondary | ICD-10-CM | POA: Insufficient documentation

## 2023-11-11 DIAGNOSIS — I5020 Unspecified systolic (congestive) heart failure: Secondary | ICD-10-CM

## 2023-11-11 DIAGNOSIS — Z79899 Other long term (current) drug therapy: Secondary | ICD-10-CM

## 2023-11-11 DIAGNOSIS — E119 Type 2 diabetes mellitus without complications: Secondary | ICD-10-CM

## 2023-11-11 LAB — URINALYSIS WITH MICROSCOPIC
Blood,UA: NEGATIVE
Leuk Esterase,UA: NEGATIVE
Nitrite,UA: NEGATIVE
Specific Gravity,UA: 1.029 (ref 1.002–1.030)
pH,UA: 6 (ref 5.0–8.0)

## 2023-11-11 LAB — COMPREHENSIVE METABOLIC PANEL
ALT: 24 U/L (ref 0–35)
ALT: 24 U/L (ref 0–35)
AST: 32 U/L (ref 0–35)
AST: 32 U/L (ref 0–35)
Albumin: 4.7 g/dL (ref 3.5–5.2)
Albumin: 4.7 g/dL (ref 3.5–5.2)
Alk Phos: 92 U/L (ref 35–105)
Alk Phos: 92 U/L (ref 35–105)
Anion Gap: 11 (ref 7–16)
Anion Gap: 11 (ref 7–16)
Bilirubin,Total: 0.7 mg/dL (ref 0.0–1.2)
Bilirubin,Total: 0.7 mg/dL (ref 0.0–1.2)
CO2: 27 mmol/L (ref 20–28)
CO2: 27 mmol/L (ref 20–28)
Calcium: 10.1 mg/dL (ref 8.6–10.2)
Calcium: 10.1 mg/dL (ref 8.6–10.2)
Chloride: 98 mmol/L (ref 96–108)
Chloride: 98 mmol/L (ref 96–108)
Creatinine: 1.12 mg/dL — ABNORMAL HIGH (ref 0.51–0.95)
Creatinine: 1.12 mg/dL — ABNORMAL HIGH (ref 0.51–0.95)
Glucose: 95 mg/dL (ref 60–99)
Glucose: 95 mg/dL (ref 60–99)
Lab: 28 mg/dL — ABNORMAL HIGH (ref 6–20)
Lab: 28 mg/dL — ABNORMAL HIGH (ref 6–20)
Potassium: 5.7 mmol/L — ABNORMAL HIGH (ref 3.3–5.1)
Potassium: 5.7 mmol/L — ABNORMAL HIGH (ref 3.3–5.1)
Sodium: 136 mmol/L (ref 133–145)
Sodium: 136 mmol/L (ref 133–145)
Total Protein: 7.1 g/dL (ref 6.3–7.7)
Total Protein: 7.1 g/dL (ref 6.3–7.7)
eGFR BY CREAT: 55 — AB
eGFR BY CREAT: 55 — AB

## 2023-11-11 LAB — PROTEIN,UR + CREAT,UR WITH RATIO
Creatinine,UR: 156 mg/dL (ref 20–300)
Protein,UR: 13 mg/dL — ABNORMAL HIGH (ref 0–11)
TP Creatinine ratio,UR: 0.08

## 2023-11-11 LAB — CYSTATIN C WITH EGFR
Cystatin C: 1.4 mg/L — ABNORMAL HIGH (ref 0.6–1.0)
eGFR by Cystatin C: 46

## 2023-11-11 LAB — IRON: Iron: 126 ug/dL (ref 34–165)

## 2023-11-11 LAB — TIBC
Iron: 126 ug/dL (ref 34–165)
TIBC: 377 ug/dL (ref 250–450)
Transferrin Saturation: 33 % (ref 15–50)

## 2023-11-11 LAB — MAGNESIUM: Magnesium: 2.4 mg/dL (ref 1.6–2.5)

## 2023-11-11 LAB — ECHO COMPLETE
AV Area (LV SV Mtd): 1.84 cm2
AV Area (LV SV) BSA Index: 0.82 cm2/m2
AV Area (LVOT SR Mtd): 2.14 cm2
AV Area (LVOT SV Mtd): 1.81 cm2
AV CWD VTI: 32 cm
AV CWD Velocity (Mean): 120 cm/s
AV CWD Velocity (Peak): 169 cm/s
AV Gradient (mean): 5.4 mmHg
AV Gradient (peak): 9 mmHg
LVOT + AV Gradient (mean): 6.7 mmHg
LVOT + AV Gradient (peak): 11.4 mmHg
LVOT Cardiac Index: 1.84 L/min/m2
LVOT/AV Velocity Ratio: 0.46
RA Volume BSA Index: 12.1 mL/m2
RA Volume Height Index: 16.1 mL/m
RA Volume: 27 mL
Tricuspid Annular S velocity: 10 cm/s

## 2023-11-11 LAB — CBC AND DIFFERENTIAL
Baso # K/uL: 0.1 THOU/uL (ref 0.0–0.2)
Eos # K/uL: 0.1 THOU/uL (ref 0.0–0.5)
Hematocrit: 51 % — ABNORMAL HIGH (ref 34–49)
Hemoglobin: 15.6 g/dL (ref 11.2–16.0)
IMM Granulocytes #: 0 THOU/uL (ref 0–0)
IMM Granulocytes: 0.2 %
Lymph # K/uL: 2 THOU/uL (ref 1.0–5.0)
MCV: 90 fL (ref 75–100)
Mono # K/uL: 0.6 THOU/uL (ref 0.1–1.0)
Neut # K/uL: 3.7 THOU/uL (ref 1.5–6.5)
Nucl RBC # K/uL: 0 THOU/uL
Nucl RBC %: 0 /100{WBCs} (ref 0.0–0.2)
Platelets: 218 THOU/uL (ref 150–450)
RBC: 5.6 MIL/uL — ABNORMAL HIGH (ref 4.0–5.5)
RDW: 17.2 % — ABNORMAL HIGH (ref 0.0–15.0)
Seg Neut %: 57.4 %
WBC: 6.5 THOU/uL (ref 3.5–11.0)

## 2023-11-11 LAB — LIPID PANEL
Chol/HDL Ratio: 1.9
Cholesterol: 143 mg/dL
HDL: 76 mg/dL — ABNORMAL HIGH (ref 40–60)
LDL Calculated: 52 mg/dL
Non HDL Cholesterol: 67 mg/dL
Triglycerides: 75 mg/dL

## 2023-11-11 LAB — TSH
TSH: 3.62 u[IU]/mL (ref 0.27–4.20)
TSH: 3.62 u[IU]/mL (ref 0.27–4.20)

## 2023-11-11 LAB — HEMOGLOBIN A1C: Hemoglobin A1C: 6.3 % — ABNORMAL HIGH (ref <=5.6)

## 2023-11-11 LAB — T4, FREE
Free T4: 1.5 ng/dL (ref 0.9–1.7)
Free T4: 1.5 ng/dL (ref 0.9–1.7)

## 2023-11-11 LAB — FERRITIN: Ferritin: 527 ng/mL — ABNORMAL HIGH (ref 10–120)

## 2023-11-11 LAB — PROTEIN, URINE: Protein,UR: 13 mg/dL — ABNORMAL HIGH (ref 0–11)

## 2023-11-11 LAB — MULTIPLE ORDERING DOCS

## 2023-11-11 LAB — URIC ACID: Urate: 3.3 mg/dL (ref 2.7–6.8)

## 2023-11-11 LAB — NT-PRO BNP: NT-pro BNP: 483 pg/mL (ref 0–900)

## 2023-11-11 NOTE — Result Encounter Note (Signed)
 Normal LFTs and TFTs on amiodarone .  Potassium high after starting valsartan. Will review with Dr Jori

## 2023-11-12 ENCOUNTER — Other Ambulatory Visit: Payer: Self-pay

## 2023-11-12 ENCOUNTER — Telehealth: Payer: Self-pay

## 2023-11-12 DIAGNOSIS — E875 Hyperkalemia: Secondary | ICD-10-CM

## 2023-11-12 LAB — ECHO COMPLETE
Aortic Diameter (sinus of Valsalva): 3.5 cm
BMI: 38.25 kg/m2
BSA: 2.24 m2
E/A ratio: 74 mmHg
Heart Rate: 71 {beats}/min
Height: 66 in
IVC Diameter: 3.3 cm
LV ASE Mass Height 2.7 Index: 161.5 g/m2
LV ASE Mass Height 2.7 Index: 73.4 g/m2
LV ASE Mass Height Index: 98 g/m2
LV CO BSA Index: 1.86 cm2/m2
LV CO BSA Index: 24.2 mL/m2
LV Cardiac Output: 32 cm
LV SV Height Index: 35.1 mL/m
LV Septal Thickness: 1 cm
LV Stroke Volume: 58.8 mL
LV Systolic Volume Index: 169 mL/m2
LV Systolic Volume Index: 42.1 mL/m2
LV wall/cavity ratio: 58.8 mL
LV wall/cavity ratio: 96.3 gm/m
LVED Diameter Height Index: 2.92 cm/m2
LVED Diameter: 5.53 cm
LVED Volume BSA Index: 68 mL/m2
LVED Volume BSA Index: 68 mmHg
LVED Volume BSA Index: 68.3 mL/m2
LVED Volume: 153.1 mL
LVEF (Volume): 38 cm
LVEF (Volume): 38 cm/m2
LVES Volume BSA Index: 120 mL/m2
LVES Volume BSA Index: 68.3 mL/m2
LVES Volume BSA Index: 91.3 mL/m2
LVES Volume Height Index: 164.3 g
LVES Volume Height Index: 91.3 mL/m
LVES Volume: 153.1 mL
LVOT + AV Gradient (mean): 108 mmHg
LVOT Area (calculated): 26.3 mL/m2
LVOT Cardiac Output: 4.11 cm2
LVOT Diameter: 2.43 cm
LVOT PWD VTI: 54.1 mL
LVOT PWD Velocity (mean): 57.6 cm/s
LVOT PWD Velocity (peak): 78.2 cm/s
LVOT PWD Velocity (peak): 94.3 mL
LVOT SV BSA Index: 25.85 mL/m2
LVOT SV Height Index: 68.3 mL/m2
LVOT Stroke Rate (mean): 267 {beats}/min
LVOT Stroke Rate (peak): 362.5 mL/s
LVOT Stroke Volume: 57.9 cc
LVOT/AV Velocity Ratio: 74 mmHg
MV Peak A Velocity: 372 cm/s
MV Peak A Velocity: 54.1 mL
MV Peak E Velocity: 372 cm/s
Mitral Annular E/Ea Vel Ratio: 16.91
Mitral Annular E/Ea Vel Ratio: 26.3 mL/m2
Mitral Annular Ea Velocity: 3.5 cm/s
Mitral Annular Ea Velocity: 36.7 cm/s
RA Volume BSA Index: 12.1 mL/m2
RA Volume Height Index: 68.3 mL/m2
RA Volume: 27 mL
RR Interval: 845.07 mmHg
SEM (LVOT Mean) mN-s: 108 mmHg
SEM (LVOT peak) mN-s: 845.07 ms
Tricuspid Annular S velocity: 32.3 cm/s
Weight (lbs): 237 [lb_av]
Weight: 3792 [oz_av]

## 2023-11-12 MED ORDER — PATIROMER SORBITEX CALCIUM 8.4 G PO PACK *I*
8.4000 g | PACK | Freq: Every day | ORAL | 0 refills | Status: DC
Start: 2023-11-12 — End: 2023-12-04
  Filled 2023-11-12: qty 30, 30d supply, fill #0

## 2023-11-12 NOTE — Telephone Encounter (Signed)
 Called Carly Higgins to review her elevated potassium level. She will start veltassa 8.4 Grams daily and repeat her labs in 1 week at [facility] in [place]  at  [address] [place], [place] [address]Tzadpuz gives the following fax number: [phone]

## 2023-11-13 LAB — PROTEIN ELECTROPHORESIS,UR: Protein,UR: 13 mg/dL — ABNORMAL HIGH (ref 0–11)

## 2023-11-13 LAB — UPE ELECT,REVIEW

## 2023-11-14 ENCOUNTER — Telehealth: Payer: Self-pay | Admitting: Internal Medicine

## 2023-11-14 ENCOUNTER — Telehealth: Payer: Self-pay

## 2023-11-14 ENCOUNTER — Encounter: Payer: Self-pay | Admitting: Orthopedic Surgery

## 2023-11-14 ENCOUNTER — Ambulatory Visit: Admission: RE | Admit: 2023-11-14 | Discharge: 2023-11-14 | Disposition: A | Source: Ambulatory Visit

## 2023-11-14 ENCOUNTER — Ambulatory Visit: Attending: Orthopedic Surgery

## 2023-11-14 ENCOUNTER — Other Ambulatory Visit: Payer: Self-pay

## 2023-11-14 VITALS — BP 94/62 | HR 73 | Ht 66.0 in | Wt 237.0 lb

## 2023-11-14 DIAGNOSIS — I4891 Unspecified atrial fibrillation: Secondary | ICD-10-CM

## 2023-11-14 DIAGNOSIS — Z96641 Presence of right artificial hip joint: Secondary | ICD-10-CM

## 2023-11-14 DIAGNOSIS — M1611 Unilateral primary osteoarthritis, right hip: Secondary | ICD-10-CM

## 2023-11-14 DIAGNOSIS — I428 Other cardiomyopathies: Secondary | ICD-10-CM

## 2023-11-14 DIAGNOSIS — Z471 Aftercare following joint replacement surgery: Secondary | ICD-10-CM

## 2023-11-14 LAB — PROTEIN ELECT WITH REFLEX TO IMMUNOFIXATION
A/G Ratio: 1.4 (ref 0.9–1.8)
Albumin: 4.2 g/dL (ref 3.5–5.1)
Alpha 1: 0.4 g/dL (ref 0.2–0.4)
Alpha 2: 1 g/dL — ABNORMAL HIGH (ref 0.4–0.9)
Beta: 0.9 g/dL (ref 0.5–1.0)
Gamma: 0.7 g/dL (ref 0.7–1.4)
Interp,PE: NORMAL
Total Protein: 7.1 g/dL (ref 6.3–7.7)

## 2023-11-14 LAB — PE ELECT,REVIEW

## 2023-11-14 LAB — IMMUNOFIX,SERUM REVIEW

## 2023-11-14 LAB — IMMUNOFIXATION ELECTROPHORESIS: Immunofixation,Serum: NORMAL

## 2023-11-14 NOTE — Patient Instructions (Signed)
Joint Replacement Post-Operative Instructions, Annual Follow up    Joint replacement means a lifestyle change! In order to get the full benefit out of the implant, you must care for your hip or knee replacement.  It is important to continue regular follow up with your surgeon!  Think of it as performing regular maintenance on your car, your joint replacement requires regular care.  In many cases, joint replacements do not last forever and the lifespan of your joint depends on several factors; regular follow up can identify any problems with the joint replacement before they become challenging to address.    Continue to take an active role in your overall health!  Because several of the lifestyle modifications were overall health-related improvements, you should focus on maintaining many of the healthy changes that you implemented to improve your overall health and continue a healthy lifestyle.  Maintaining a healthy weight, abstinence from tobacco, alcohol consumption in moderation, and optimizing your chronic medical conditions such as diabetes and high blood pressure are all lifestyle changes that you may have made for surgery and are crucial in keeping your body in good condition to enjoy your new joint replacement for many years to come!    Treating pain after hip/knee replacement:   Anti-inflammatory medications: The anti-inflammatory (Ibuprofen, Naproxen, Celecoxib) can continue to be taken on an as needed basis for pain control if allowed by your primary care physician.  The side effects of non-steroidal, anti-inflammatory pain relievers (NSAIDs) include:  Stomach pain, heartburn (these medications should be taken with meals or on a full stomach)  Stomach ulcers  Thinning of the blood (use caution if you already take blood thinners for other medical reasons)  Allergic reactions: rashes  Kidney problems (do not take these medications if you have a history of kidney disease)  High blood pressure  Warning: If  you experience any of the above side effects, stop taking the medication and notify your doctor immediately.    Acetaminophen (Tylenol):  This medication is an effective over the counter pain medicine. It can be combined with the above anti-inflammatory medication as it has a different mechanism of action and different side effect profile. Taking both of these medications together is more effective than taking either medication by itself.  Use caution not to exceed 3,000-4,000 mg of Acetaminophen in a single 24 hour time period as it can increase your risk of liver-related side effects.  Neuroactive medications: Medications that target nerve-related pain such as gabapentin (Neurontin) or pregabalin (Lyrica) can sometimes be helpful.  Discuss these options with your surgeon, if your pain seems to be nerve-related.  These medications do have side effects including drowsiness, dizziness, dry mouth, and they are metabolized by your liver.  Otherwise, they are relatively well tolerated medications.  Opioid (narcotic) medications: These medications are reserved for immediate postoperative pain, and are not generally recommended for persistent pain and discomfort after your recovery from joint replacement.  They have several side effects, and are highly addictive especially when used for long periods of time.  Moreover, the longer they are used, the less effective they become as your body develops tolerance to these medications.  For several reasons, you should avoid taking opioid medications for prolonged periods of time.  If you continue to have pain following your joint replacement with an unclear source, these medications should be a last resort, and should only be part of a multimodal plan to manage your pain once it has been evaluated and treated by a   pain management specialist.    Assist Devices: If you have problems with balance you should continue with the assist device to be safe.  It is okay to continue using a  walker or cane if you are concerned about frequent falls.  An injury from a fall can be devastating for your overall health at any time!    Normal Activities & Exercise:  Exercising regularly, stationary biking, elliptical machines or swimming are great exercises to minimize traumatic impact to your new joint replacement, while building stamina, endurance and cardiovascular health. Keep in mind that your joint has been replaced with mechanical parts. It requires regular maintenance and care. Your muscles, tendons, ligaments all need to be regularly stretched and strengthened to maintain optimal function of your joint replacement.  Regular physical activity is paramount in maintaining a healthy lifestyle!    The American Association of Hip and Knee Surgeons has these recommendations:  - Practicing low impact exercises, activities, and sports is fine and encouraged following joint replacement.  - Intermediate impact sports are recommended if you have participated in those sports previously. If not, they should not be initiated after joint replacement.  - In general, it is recommended not to participate in high impact activities and sports as many of these can be damaging to the joint replacement parts. If you do elect to    participate in these types of activities, use moderation and always be cautious and protective of your joint replacement!    Going to the Dentist: Do not resume dental work, including routine cleanings, until three months from your surgery date. The American Academy of Orthopedic Surgeons no longer recommends routine antibiotic use before invasive dental procedures.  In some select patients, there may be a benefit to taking preventative antibiotics if risk is high enough.  If you meet any of the below criteria, you may benefit from antibiotics before these procedures, but discuss with your orthopedic surgeon:   You are severely immunocompromised (on chemotherapy or long term oral steroids)  You  have previously been treated with surgery for an infection of a replaced joint  You have poorly controlled diabetes (Hemoglobin A1c > 8)    Traveling after joint replacement:  Your implant is likely to set off the metal detectors in airport security. If you notify the TSA agents prior to screening, they may elect screen using a body scanner to avoid any further extensive screening (i.e. pat down).         Dr. Kaplan's regular follow up schedule for you once you are 1 year out from surgery is as follows:  - Follow up once you are 3 years out from surgery  - Follow up every 5 years thereafter  - As mentioned above, it is very important that you reach out and potentially schedule a follow up visit if you begin to experience any difficulty or trouble with your replaced joint.    Please do not hesitate to contact our office with any questions or concerns!

## 2023-11-14 NOTE — Progress Notes (Signed)
 Total Hip Replacement, Follow-Up VisitHistory of Present IllnessThe patient is a 63 year old female who presents for a follow-up postoperative evaluation approximately 11 months status post right total hip arthroplasty on 12/21/2022 with Dr. Debrah.She reports satisfactory progress with her hip, experiencing no pain or functional limitations. She has not had any recent falls or injuries and is now able to cross her legs, which she was previously unable to do.She reports feeling well at present, with no lightheadedness, dizziness, or shortness of breath. She is able to walk and navigate hills, tasks she was previously unable to perform. Current medications were updated during this office encounter, and were personally reviewed.Above histories were obtained from external medical records and documents that were each personally reviewed.An independent historian was not required to complete above history taking.  PHYSICAL EXAMINATION:  Physical examination of the right hip reveals a well-healed incision. The patient walks with a reasonable gait with no obvious antalgia related to their hip.  The hip has painless range of motion with flexion up to 90 degrees, internal rotation to 10 degrees and external rotation to 45 degrees.  The lower extremity is neurovascularly intact per the patient's baseline sensation and strength.STUDY REVIEW AND INTERPRETATION: Radiographs: X-rays obtained today, and reviewed personally, are an AP pelvis, AP and lateral of the right hip demonstrate well-fixed acetabular and femoral components in good alignment. There are no radiolucent lines or evidence of loosening. IMPRESSION:  Carly Higgins is a 63 y.o. female following up for routine follow up approximately 11 months status post right total hip arthroplasty.PLAN:  The patient is doing well overall.  Antibiotic prophylaxis for dental and surgical procedures was re-iterated to the  patient.  General hip exercises, precautions and safety measures were reviewed.  Standard ongoing routine follow up schedule was reviewed.  Assessment & Plan1. Postoperative status following right total hip arthroplasty:- Recovery trajectory is satisfactory, with no reported pain or functional limitations.- Radiographic evidence supports this positive outcomeFollow-up: A follow-up appointment will be scheduled in approximately 2 years for further evaluation of her hip condition. At today's visit, the patient's recorded blood pressure was 94/62, which the patient states is low for them. They denied any associated signs or symptoms such as lightheadedness, headache, chest pain, or shortness of breath. They were instructed to follow-up with their PCP on the matter.Unique studies/tests ordered today: Radiographs, personally reviewedMedical or Drug therapy ordered today: NoneNext follow up: 2-3 yearsStudies to order for next visit: 2 views right hip including AP pelvisAll additional questions were answered, the patient was encouraged to call with any future questions or concerns.Glendia Smock, FNP-C  Division of Adult ReconstructionUniversity of North Braddock Medical CenterDepartment of Orthopaedics and Rehabilitation The patient was informed that the above conversation was recorded and summarized by the DAX ambient AI scribe software.

## 2023-11-14 NOTE — Telephone Encounter (Signed)
 Called pt-LMOM- asking pt to call office to make appt to see Cassie tonight.

## 2023-11-14 NOTE — Telephone Encounter (Signed)
 Patient called to report she was unable to pick up Rx Ena) from pharmacy as insurance wont cover it. Patient expressed great concern as she leaves for [place] vacation tomorrow and would like to pick up Rx ASAP. Contacted [facility] who confirmed a PA is needed for the medication. Imitated PA and requested as urgent.

## 2023-11-14 NOTE — Telephone Encounter (Signed)
 Medication prescribed patiromer (Veltassa), with a dosage of 8.4g.Provider prescribing medication. Curvin Lauraine Caldron, NP .This is to treat their diagnosis of Hyperkalemia. They have already tried GDMT.They had allergies to has No Known Allergies (drug, envir, food or latex)..Requested by (Pharmacy) : [facility].CoverMyMeds Key (if applicable): n/a.Epic pool for replies: CARDIOLOGY HEART FAILURE NURSE.

## 2023-11-14 NOTE — Telephone Encounter (Signed)
 Pt seen in Ortho today.BP was low- 94/62.Pt left office saying was low for her. Felt little lightheaded and had vomited cpl times this morning.Wanted us  to be aware.

## 2023-11-14 NOTE — Progress Notes (Signed)
 PCP office was called to inform of BP at visit today, 94/62 manual. Patient stated she was feeling fine at the time but did not some light headedness yesterday that resolved. She also had nausea yesterday and did vomit this morning multiple times. Pt leaving for [place]  tomorrow. Ocie Reader, RN

## 2023-11-15 NOTE — Telephone Encounter (Signed)
 Called pt-LMOM- asking pt to call to make appt to assess BP and sx.

## 2023-11-15 NOTE — Telephone Encounter (Signed)
 Pt is on her way to [place] .

## 2023-11-17 ENCOUNTER — Other Ambulatory Visit: Payer: Self-pay

## 2023-11-17 DIAGNOSIS — E875 Hyperkalemia: Secondary | ICD-10-CM

## 2023-11-18 ENCOUNTER — Other Ambulatory Visit: Payer: Self-pay

## 2023-11-20 ENCOUNTER — Ambulatory Visit: Admitting: Hematology and Oncology

## 2023-11-20 ENCOUNTER — Other Ambulatory Visit: Payer: Self-pay

## 2023-11-20 NOTE — Telephone Encounter (Signed)
 Spoke to pharmacy. Medication was shipped out to pt.

## 2023-11-22 ENCOUNTER — Other Ambulatory Visit: Payer: Self-pay

## 2023-11-22 DIAGNOSIS — I428 Other cardiomyopathies: Secondary | ICD-10-CM

## 2023-11-26 MED ORDER — METOPROLOL SUCCINATE 100 MG PO TB24 *I*
150.0000 mg | ORAL_TABLET | Freq: Every day | ORAL | 3 refills | Status: AC
Start: 2023-11-26 — End: 2024-11-20

## 2023-11-26 MED ORDER — EMPAGLIFLOZIN 10 MG PO TABS *I*: 10.0000 mg | ORAL_TABLET | Freq: Every morning | ORAL | 0 refills | Status: DC

## 2023-11-26 MED ORDER — SPIRONOLACTONE 25 MG PO TABS *I*: 25.0000 mg | ORAL_TABLET | Freq: Every day | ORAL | 0 refills | Status: DC

## 2023-11-26 NOTE — Telephone Encounter (Signed)
 Medications sent to pharmacy in [place]  as requested.

## 2023-11-26 NOTE — Telephone Encounter (Signed)
 Pt called to request that the lab orders she needs for the Veltassa be sent to her local [facility], as she is currently in [facility]. [facility] in [place] - [address]  [place], [place] [phone](e)Eu also requests new Rxs for the following, as she is out of refills:JardianceSpironolactoneMetoprololShe is using the [facility] where the [facility] [phone]  [place], [place] [phone])[phone](e)(Eu said [facility] repeatedly but this is what matches the information she gave me.)

## 2023-11-26 NOTE — Telephone Encounter (Signed)
 error

## 2023-11-26 NOTE — Addendum Note (Signed)
 Addended by: CURVIN DOMINO on: 11/26/2023 09:49 AM Modules accepted: Orders

## 2023-11-28 ENCOUNTER — Ambulatory Visit: Admitting: Orthopedic Surgery

## 2023-12-02 ENCOUNTER — Other Ambulatory Visit: Payer: Self-pay

## 2023-12-03 ENCOUNTER — Other Ambulatory Visit: Payer: Self-pay

## 2023-12-03 ENCOUNTER — Ambulatory Visit: Payer: Self-pay

## 2023-12-03 DIAGNOSIS — E875 Hyperkalemia: Secondary | ICD-10-CM

## 2023-12-03 DIAGNOSIS — I428 Other cardiomyopathies: Secondary | ICD-10-CM

## 2023-12-03 LAB — BASIC METABOLIC PANEL - LABCORP *TRANSPLANT ONLY*
CO2: 26 mmol/L (ref 20–29)
Calcium: 10.2 mg/dL (ref 8.7–10.3)
Chloride: 99 mmol/L (ref 96–106)
Creatinine: 1.21 mg/dL — ABNORMAL HIGH (ref 0.57–1.00)
Egfr Nonafr Am: 50 mL/min/1.73 — ABNORMAL LOW (ref >59)
Glucose: 121 mg/dL — ABNORMAL HIGH (ref 70–99)
Lab: 20 mg/dL (ref 8–27)
Potassium: 5.9 mmol/L — ABNORMAL HIGH (ref 3.5–5.2)
Sodium: 139 mmol/L (ref 134–144)
UN/Creat Ratio: 17 (ref 12–28)

## 2023-12-03 LAB — SPECIMEN STATUS REPORT LABCORP *TRANSPLANT ONLY*

## 2023-12-03 NOTE — Telephone Encounter (Addendum)
 Attempted x5 to fax standing labs for CMP and Mag faxed to [facility] in [facility] at [phone] with STAT indicated on cover sheet. Unable to complete fax. Attempted to call [facility] to find alternate fax number but already close for the day. Will reattempt first thing tomorrow a.m.

## 2023-12-03 NOTE — Telephone Encounter (Signed)
 Called pt regarding her lab results.Pt reports overall feeling well, she notes she does have fatigue but nothing more than her baseline.Advised pt that her potassium is too high at 5.9.  Pt reports that she has been taking Veltassa  8.4 mg daily.  She notes however, she has not reduced her potassium rich foods.  She notes taking the Veltassa  with orange juice and eating 2-3 oranges daily at times.  She is currently in [place] .Plan:Pt will take Lasix  20 mg BID today.She already took her Veltassa  today, however she will increase her dose tomorrow AM to 16.8 mg, and continue on the 16.8 mg daily dose.She will decrease her potassium rich foods, I have sent her a list in mychart for her reference.Discussed is level is not improving tomorrow ED will be recommended for better management.Will consider reducing spironolactone  if Veltassa  is not improving level at this dose.Follow up Wednesday.RNs can you please fax standing labs for CMP and Mag that I placed to lab below.  Pt will go in the early afternoon tomorrow.  Can you also write STAT thanks, [facility] [phone]  [place], [place] [phone](e)Yzjuyzm Nelwyn, NP

## 2023-12-04 ENCOUNTER — Other Ambulatory Visit: Payer: Self-pay

## 2023-12-04 MED ORDER — PATIROMER SORBITEX CALCIUM 8.4 G PO PACK *I*: 8.4000 g | PACK | Freq: Every day | ORAL | 0 refills | Status: DC

## 2023-12-04 NOTE — Telephone Encounter (Signed)
 Veltassa  script sent [facility] local [facility].

## 2023-12-04 NOTE — Telephone Encounter (Signed)
 Attempted to refax this a.m. but will not go through at provided number. Called [facility] and LVM requesting call back ASAP to provide working fax number.

## 2023-12-05 LAB — COMP. METABOLIC PANEL - LABCORP *TRANSPLANT ONLY*
ALT: 23 IU/L (ref 0–32)
AST: 27 IU/L (ref 0–40)
Albumin: 4.6 g/dL (ref 3.9–4.9)
Alk Phos: 88 IU/L (ref 49–135)
Bilirubin,Total: 0.7 mg/dL (ref 0.0–1.2)
CO2: 27 mmol/L (ref 20–29)
Calcium: 10 mg/dL (ref 8.7–10.3)
Chloride: 95 mmol/L — ABNORMAL LOW (ref 96–106)
Creatinine: 1.31 mg/dL — ABNORMAL HIGH (ref 0.57–1.00)
Egfr Nonafr Am: 46 mL/min/1.73 — ABNORMAL LOW (ref >59)
Globulin Total: 2.3 g/dL (ref 1.5–4.5)
Glucose: 108 mg/dL — ABNORMAL HIGH (ref 70–99)
Lab: 29 mg/dL — ABNORMAL HIGH (ref 8–27)
Potassium: 4.9 mmol/L (ref 3.5–5.2)
Sodium: 135 mmol/L (ref 134–144)
Total Protein: 6.9 g/dL (ref 6.0–8.5)
UN/Creat Ratio: 22 (ref 12–28)

## 2023-12-05 LAB — MAGNESIUM: Magnesium: 2.2 mg/dL (ref 1.6–2.3)

## 2023-12-06 ENCOUNTER — Other Ambulatory Visit: Payer: Self-pay

## 2023-12-06 DIAGNOSIS — E875 Hyperkalemia: Secondary | ICD-10-CM

## 2023-12-06 DIAGNOSIS — I428 Other cardiomyopathies: Secondary | ICD-10-CM

## 2023-12-09 ENCOUNTER — Ambulatory Visit: Payer: Self-pay

## 2023-12-09 DIAGNOSIS — E875 Hyperkalemia: Secondary | ICD-10-CM

## 2023-12-09 MED ORDER — PATIROMER SORBITEX CALCIUM 8.4 G PO PACK *I*: 16.8000 g | PACK | Freq: Every day | ORAL | 2 refills | Status: DC

## 2023-12-13 ENCOUNTER — Other Ambulatory Visit: Payer: Self-pay

## 2023-12-13 DIAGNOSIS — E875 Hyperkalemia: Secondary | ICD-10-CM

## 2023-12-13 DIAGNOSIS — I428 Other cardiomyopathies: Secondary | ICD-10-CM

## 2023-12-14 ENCOUNTER — Other Ambulatory Visit: Payer: Self-pay

## 2023-12-14 ENCOUNTER — Other Ambulatory Visit: Payer: Self-pay | Admitting: Internal Medicine

## 2023-12-14 DIAGNOSIS — I428 Other cardiomyopathies: Secondary | ICD-10-CM

## 2023-12-17 ENCOUNTER — Other Ambulatory Visit: Payer: Self-pay

## 2023-12-17 ENCOUNTER — Encounter

## 2023-12-17 ENCOUNTER — Other Ambulatory Visit: Payer: Self-pay | Admitting: Transplant

## 2023-12-17 DIAGNOSIS — I4891 Unspecified atrial fibrillation: Secondary | ICD-10-CM

## 2023-12-17 MED ORDER — AMIODARONE HCL 200 MG PO TABS *I*: 200.0000 mg | ORAL_TABLET | Freq: Every day | ORAL | 2 refills | Status: AC

## 2023-12-17 MED ORDER — VALSARTAN 40 MG PO TABS *I*: 20.0000 mg | ORAL_TABLET | Freq: Two times a day (BID) | ORAL | 3 refills | Status: AC

## 2023-12-17 NOTE — Telephone Encounter (Signed)
 90 day supply, 3 refills pended.Please Review Labs Before Signing:GENERAL CHEMISTRY Recent Labs   11/26/250935 11/24/250918 11/03/250909 10/24/250747 10/22/250826 09/16/251025 NA 135 139 136  136  --  139 140 K 4.9 5.9* 5.7*  5.7*   < > 5.4* 5.2* CL 95* 99 98  98  --  101 100 CO2 27 26 27  27   --  26 27 GAP  --   --  11  11  --  12 13 UN 29* 20 28*  28*  --  23* 27* CREAT 1.31* 1.21* 1.12*  1.12*  --  0.94 1.16* GLU 108* 121* 95  95  --  109* 125* CA 10.0 10.2 10.1  10.1  --  10.2 9.7 URIC  --   --  3.3  --   --   --   < > = values in this interval not displayed.

## 2023-12-20 ENCOUNTER — Other Ambulatory Visit: Payer: Self-pay

## 2023-12-20 DIAGNOSIS — I428 Other cardiomyopathies: Secondary | ICD-10-CM

## 2023-12-20 DIAGNOSIS — E875 Hyperkalemia: Secondary | ICD-10-CM

## 2023-12-23 ENCOUNTER — Other Ambulatory Visit: Payer: Self-pay

## 2023-12-23 ENCOUNTER — Other Ambulatory Visit: Payer: Self-pay | Admitting: Internal Medicine

## 2023-12-23 MED ORDER — LEVOTHYROXINE SODIUM 50 MCG PO TABS *I*: 50.0000 ug | ORAL_TABLET | Freq: Every day | ORAL | 5 refills | Status: AC

## 2023-12-23 MED ORDER — ESCITALOPRAM OXALATE 10 MG PO TABS *I*: 10.0000 mg | ORAL_TABLET | Freq: Every day | ORAL | 1 refills | Status: AC

## 2023-12-23 MED ORDER — ACYCLOVIR 400 MG PO TABS *I*: 400.0000 mg | ORAL_TABLET | Freq: Two times a day (BID) | ORAL | 3 refills | Status: AC

## 2023-12-24 ENCOUNTER — Ambulatory Visit: Payer: Self-pay

## 2023-12-24 DIAGNOSIS — E875 Hyperkalemia: Secondary | ICD-10-CM

## 2023-12-24 LAB — COMP. METABOLIC PANEL - LABCORP *TRANSPLANT ONLY*
ALT: 19 IU/L (ref 0–32)
AST: 26 IU/L (ref 0–40)
Albumin: 4.5 g/dL (ref 3.9–4.9)
Alk Phos: 77 IU/L (ref 49–135)
Bilirubin,Total: 0.8 mg/dL (ref 0.0–1.2)
CO2: 25 mmol/L (ref 20–29)
Calcium: 9.8 mg/dL (ref 8.7–10.3)
Chloride: 101 mmol/L (ref 96–106)
Creatinine: 1.1 mg/dL — ABNORMAL HIGH (ref 0.57–1.00)
Egfr Nonafr Am: 56 mL/min/1.73 — ABNORMAL LOW (ref >59)
Globulin Total: 2.1 g/dL (ref 1.5–4.5)
Glucose: 99 mg/dL (ref 70–99)
Lab: 22 mg/dL (ref 8–27)
Potassium: 5.1 mmol/L (ref 3.5–5.2)
Sodium: 139 mmol/L (ref 134–144)
Total Protein: 6.6 g/dL (ref 6.0–8.5)
UN/Creat Ratio: 20 (ref 12–28)

## 2023-12-24 LAB — MAGNESIUM: Magnesium: 2.1 mg/dL (ref 1.6–2.3)

## 2023-12-25 ENCOUNTER — Other Ambulatory Visit: Payer: Self-pay

## 2023-12-25 DIAGNOSIS — E875 Hyperkalemia: Secondary | ICD-10-CM

## 2023-12-25 MED ORDER — PATIROMER SORBITEX CALCIUM 8.4 G PO PACK *I*: 16.8000 g | PACK | Freq: Every day | ORAL | 2 refills | Status: AC

## 2023-12-28 ENCOUNTER — Other Ambulatory Visit: Payer: Self-pay

## 2023-12-28 DIAGNOSIS — E875 Hyperkalemia: Secondary | ICD-10-CM

## 2024-01-06 ENCOUNTER — Telehealth: Payer: Self-pay

## 2024-01-06 NOTE — Telephone Encounter (Signed)
 Medication prescribed Veltassa , with a dosage of 16.8g.Provider prescribing medication. Lauraine Ehrich NP.This is to treat their diagnosis of Hyperkalemia. They have already tried GDMT.They had allergies to has No Known Allergies (drug, envir, food or latex)..Requested by (Pharmacy) : [facility] [phone] - [place], [place] - [address] .CoverMyMeds Key (if applicable): Epic pool for replies: Cardiology Heart Failure Nurse URGENT

## 2024-01-06 NOTE — Telephone Encounter (Signed)
 Incoming call from [place] at [facility] regarding the Veltassa .  States they have sent faxes to our office requesting a prior auth for this medication.  Patients insurance will not cover the cost until its approved.  [id number] - [place], [place] - [address]

## 2024-01-10 NOTE — Telephone Encounter (Signed)
 Spoke with pharmacy to provide approval for Veltassa . All set.

## 2024-01-23 ENCOUNTER — Telehealth: Payer: Self-pay

## 2024-01-23 ENCOUNTER — Other Ambulatory Visit: Payer: Self-pay

## 2024-01-23 DIAGNOSIS — I428 Other cardiomyopathies: Secondary | ICD-10-CM

## 2024-01-23 NOTE — Addendum Note (Signed)
 Addended by: CURVIN DOMINO on: 01/23/2024 04:06 PM Modules accepted: Orders

## 2024-01-23 NOTE — Telephone Encounter (Signed)
 Pt called back when she noticed that she had two appts scheduled for the same date at the same time -- EP & AHF on 5/15 @ 10:00 AM. AHF appt was scheduled 12/25/23; EP appt scheduled 01/21/24.- Rescheduled her appt w/ SY as our schedules can be more flexible than EP; will see SY 5/18.Discussed cMRI -- pt decided to wait until she returns from [facility] this spring to have cMRI w/ [facility] than have to pay for any bigger portion of it in [facility]. Pt to be in [place] until Friday after Easter, says she will be available after 4/15.- Sent the imaging request; will send pt message with date/time/location when available.

## 2024-01-23 NOTE — Telephone Encounter (Signed)
 Incoming call from [employer].  She is currently in [place], [place]  & she found a place to have her cardiac MRI done.   [facility]. Address:  [address] [place], [place] [address] for imaging dept:  [phone] for imaging: [phone] we get the request faxed to them please?  Or I can fax, I just need to know what exactly needs to be faxed.  She will be calling them later on today to see if they received it.

## 2024-01-24 ENCOUNTER — Telehealth: Payer: Self-pay | Admitting: Internal Medicine

## 2024-01-24 ENCOUNTER — Ambulatory Visit: Payer: Self-pay

## 2024-01-24 LAB — COMP. METABOLIC PANEL - LABCORP *TRANSPLANT ONLY*
ALT: 17 IU/L (ref 0–32)
AST: 27 IU/L (ref 0–40)
Albumin: 4.6 g/dL (ref 3.9–4.9)
Alk Phos: 77 IU/L (ref 49–135)
Bilirubin,Total: 0.8 mg/dL (ref 0.0–1.2)
CO2: 26 mmol/L (ref 20–29)
Calcium: 10.2 mg/dL (ref 8.7–10.3)
Chloride: 99 mmol/L (ref 96–106)
Creatinine: 1.28 mg/dL — ABNORMAL HIGH (ref 0.57–1.00)
Egfr Nonafr Am: 47 mL/min/1.73 — ABNORMAL LOW (ref >59)
Globulin Total: 2.3 g/dL (ref 1.5–4.5)
Glucose: 106 mg/dL — ABNORMAL HIGH (ref 70–99)
Lab: 27 mg/dL (ref 8–27)
Potassium: 4.7 mmol/L (ref 3.5–5.2)
Sodium: 136 mmol/L (ref 134–144)
Total Protein: 6.9 g/dL (ref 6.0–8.5)
UN/Creat Ratio: 21 (ref 12–28)

## 2024-01-24 LAB — MAGNESIUM: Magnesium: 2.1 mg/dL (ref 1.6–2.3)

## 2024-01-24 NOTE — Telephone Encounter (Signed)
 Pt taking Valtessa from her Cards Md.Not able to have BM in cpl days.Tried Dulcolax but not much help.Did have BM yesterday but has terrible back pain-thinks may have pulled a muscle.Asking if she can try Magnesium  Citrate?Per [facility]- Yes she can try it-take the whole bottle.If no improvement pt will need to make appt.Called pt-gave this message.Pt understands.

## 2024-02-07 ENCOUNTER — Telehealth: Payer: Self-pay

## 2024-02-07 NOTE — Telephone Encounter (Addendum)
 Called patient back, no answer. Sending fpl group.I heard you are dizzy and having some low BPs. Are you taking Lasix ? Can you please stop the Lasix ?  If your BP's remain low and you are dizzy, can you please let us  know so that we can hold your morning valsartan , continue the evening dose.

## 2024-02-07 NOTE — Telephone Encounter (Signed)
 Caller:  PTCall back number:  [phone] for call:  dizzy, maybe low BP, [place]  [facility]

## 2024-02-07 NOTE — Telephone Encounter (Signed)
 Spoke to pt who is reporting having lightheadedness and dizziness for the last 4 days. Pt reports she doesn't normally take her BP, while on phone pt asked her mother if she had her machine with her and she did. Pts BP today was 92/65. Pt reports she normally takes metoprolol  100 mg in the AM and 50 mg in the PM and has been holding the PM dose for the last two days. Pt denies having any swelling or SOB. Pt reports drinking 48 oz daily, writer encouraged pt to drink and additional 16 oz as her BP is low and she is experiencing lightheadedness. Writer to follow up with provider for further recommendations.

## 2024-02-10 ENCOUNTER — Telehealth: Payer: Self-pay | Admitting: Internal Medicine

## 2024-04-27 ENCOUNTER — Other Ambulatory Visit

## 2024-05-20 ENCOUNTER — Ambulatory Visit: Admitting: Hematology and Oncology

## 2024-05-22 ENCOUNTER — Ambulatory Visit

## 2024-05-22 ENCOUNTER — Ambulatory Visit: Admitting: Cardiology

## 2024-05-25 ENCOUNTER — Ambulatory Visit

## 2024-05-29 ENCOUNTER — Ambulatory Visit: Admitting: Cardiology

## 2024-09-18 ENCOUNTER — Ambulatory Visit
